# Patient Record
Sex: Female | Born: 1935 | Race: White | Hispanic: No | Smoking: Former smoker
Health system: Southern US, Community
[De-identification: ages and names within clinical notes are randomized; demographics above are authoritative.]

## PROBLEM LIST (undated history)

## (undated) DIAGNOSIS — M109 Gout, unspecified: Secondary | ICD-10-CM

## (undated) DIAGNOSIS — I499 Cardiac arrhythmia, unspecified: Secondary | ICD-10-CM

## (undated) DIAGNOSIS — L97519 Non-pressure chronic ulcer of other part of right foot with unspecified severity: Secondary | ICD-10-CM

## (undated) DIAGNOSIS — I509 Heart failure, unspecified: Secondary | ICD-10-CM

## (undated) DIAGNOSIS — I10 Essential (primary) hypertension: Secondary | ICD-10-CM

## (undated) DIAGNOSIS — E119 Type 2 diabetes mellitus without complications: Secondary | ICD-10-CM

## (undated) DIAGNOSIS — I96 Gangrene, not elsewhere classified: Secondary | ICD-10-CM

## (undated) DIAGNOSIS — I4891 Unspecified atrial fibrillation: Secondary | ICD-10-CM

## (undated) DIAGNOSIS — I739 Peripheral vascular disease, unspecified: Secondary | ICD-10-CM

## (undated) HISTORY — PX: EYE SURGERY: SHX253

## (undated) HISTORY — PX: LEG AMPUTATION THROUGH KNEE: SHX696

## (undated) HISTORY — PX: OTHER SURGICAL HISTORY: SHX169

## (undated) HISTORY — PX: JOINT REPLACEMENT: SHX530

---

## 2004-06-27 ENCOUNTER — Encounter: Payer: Self-pay | Admitting: Anesthesiology

## 2005-09-21 ENCOUNTER — Ambulatory Visit: Payer: Self-pay | Admitting: Ophthalmology

## 2005-10-04 ENCOUNTER — Ambulatory Visit: Payer: Self-pay | Admitting: Ophthalmology

## 2005-11-27 ENCOUNTER — Inpatient Hospital Stay: Payer: Self-pay | Admitting: Internal Medicine

## 2005-11-27 ENCOUNTER — Other Ambulatory Visit: Payer: Self-pay

## 2014-10-25 ENCOUNTER — Inpatient Hospital Stay: Payer: Self-pay | Admitting: Internal Medicine

## 2014-10-25 LAB — CBC WITH DIFFERENTIAL/PLATELET
BASOS PCT: 0.7 %
Basophil #: 0.1 10*3/uL (ref 0.0–0.1)
EOS ABS: 0.1 10*3/uL (ref 0.0–0.7)
Eosinophil %: 0.9 %
HCT: 29 % — ABNORMAL LOW (ref 35.0–47.0)
HGB: 8.6 g/dL — AB (ref 12.0–16.0)
LYMPHS PCT: 18 %
Lymphocyte #: 2.1 10*3/uL (ref 1.0–3.6)
MCH: 20.9 pg — AB (ref 26.0–34.0)
MCHC: 29.5 g/dL — ABNORMAL LOW (ref 32.0–36.0)
MCV: 71 fL — ABNORMAL LOW (ref 80–100)
MONO ABS: 0.9 x10 3/mm (ref 0.2–0.9)
Monocyte %: 7.8 %
NEUTROS PCT: 72.6 %
Neutrophil #: 8.3 10*3/uL — ABNORMAL HIGH (ref 1.4–6.5)
PLATELETS: 457 10*3/uL — AB (ref 150–440)
RBC: 4.09 10*6/uL (ref 3.80–5.20)
RDW: 22.4 % — ABNORMAL HIGH (ref 11.5–14.5)
WBC: 11.4 10*3/uL — ABNORMAL HIGH (ref 3.6–11.0)

## 2014-10-25 LAB — COMPREHENSIVE METABOLIC PANEL
ALBUMIN: 2.8 g/dL — AB (ref 3.4–5.0)
ANION GAP: 5 — AB (ref 7–16)
AST: 33 U/L (ref 15–37)
Alkaline Phosphatase: 73 U/L (ref 46–116)
BILIRUBIN TOTAL: 0.6 mg/dL (ref 0.2–1.0)
BUN: 23 mg/dL — ABNORMAL HIGH (ref 7–18)
CHLORIDE: 103 mmol/L (ref 98–107)
CREATININE: 0.97 mg/dL (ref 0.60–1.30)
Calcium, Total: 9.3 mg/dL (ref 8.5–10.1)
Co2: 32 mmol/L (ref 21–32)
EGFR (African American): >60
EGFR (Non-African Amer.): 57 — ABNORMAL LOW
Glucose: 116 mg/dL — ABNORMAL HIGH (ref 65–99)
OSMOLALITY: 284 (ref 275–301)
POTASSIUM: 3.8 mmol/L (ref 3.5–5.1)
SGPT (ALT): 25 U/L (ref 14–63)
SODIUM: 140 mmol/L (ref 136–145)
TOTAL PROTEIN: 6.9 g/dL (ref 6.4–8.2)

## 2014-10-25 LAB — DIGOXIN LEVEL: Digoxin: 0.61 ng/mL

## 2014-10-25 LAB — PROTIME-INR
INR: 1.3
Prothrombin Time: 16.3 secs — ABNORMAL HIGH (ref 11.5–14.7)

## 2014-10-25 LAB — APTT: Activated PTT: 36 secs — ABNORMAL HIGH (ref 23.6–35.9)

## 2014-10-26 LAB — CBC WITH DIFFERENTIAL/PLATELET
BASOS ABS: 0.1 10*3/uL (ref 0.0–0.1)
Basophil %: 0.9 %
EOS PCT: 1.1 %
Eosinophil #: 0.2 10*3/uL (ref 0.0–0.7)
HCT: 29.3 % — ABNORMAL LOW (ref 35.0–47.0)
HGB: 8.5 g/dL — ABNORMAL LOW (ref 12.0–16.0)
LYMPHS PCT: 17.3 %
Lymphocyte #: 2.5 10*3/uL (ref 1.0–3.6)
MCH: 20.5 pg — ABNORMAL LOW (ref 26.0–34.0)
MCHC: 29.1 g/dL — ABNORMAL LOW (ref 32.0–36.0)
MCV: 71 fL — ABNORMAL LOW (ref 80–100)
MONOS PCT: 7.5 %
Monocyte #: 1.1 x10 3/mm — ABNORMAL HIGH (ref 0.2–0.9)
Neutrophil #: 10.3 10*3/uL — ABNORMAL HIGH (ref 1.4–6.5)
Neutrophil %: 73.2 %
PLATELETS: 445 10*3/uL — AB (ref 150–440)
RBC: 4.15 10*6/uL (ref 3.80–5.20)
RDW: 21.9 % — ABNORMAL HIGH (ref 11.5–14.5)
WBC: 14.1 10*3/uL — AB (ref 3.6–11.0)

## 2014-10-26 LAB — BASIC METABOLIC PANEL
Anion Gap: 6 — ABNORMAL LOW (ref 7–16)
BUN: 20 mg/dL — ABNORMAL HIGH (ref 7–18)
CALCIUM: 8.6 mg/dL (ref 8.5–10.1)
Chloride: 105 mmol/L (ref 98–107)
Co2: 30 mmol/L (ref 21–32)
Creatinine: 0.82 mg/dL (ref 0.60–1.30)
EGFR (African American): >60
EGFR (Non-African Amer.): >60
Glucose: 109 mg/dL — ABNORMAL HIGH (ref 65–99)
OSMOLALITY: 284 (ref 275–301)
POTASSIUM: 3.8 mmol/L (ref 3.5–5.1)
Sodium: 141 mmol/L (ref 136–145)

## 2014-10-26 LAB — FERRITIN: Ferritin (ARMC): 46 ng/mL (ref 8–388)

## 2014-10-26 LAB — IRON AND TIBC
IRON BIND. CAP.(TOTAL): 336 ug/dL (ref 250–450)
Iron Saturation: 5 %
Iron: 16 ug/dL — ABNORMAL LOW (ref 50–170)
UNBOUND IRON-BIND. CAP.: 320 ug/dL

## 2014-10-26 LAB — OCCULT BLOOD X 1 CARD TO LAB, STOOL: Occult Blood, Feces: NEGATIVE

## 2014-10-26 LAB — MAGNESIUM: MAGNESIUM: 1.9 mg/dL

## 2014-10-26 LAB — HEPARIN LEVEL (UNFRACTIONATED): Anti-Xa(Unfractionated): 0.4 IU/mL (ref 0.30–0.70)

## 2014-10-27 LAB — CBC WITH DIFFERENTIAL/PLATELET
BASOS PCT: 0.9 %
Basophil #: 0.1 10*3/uL (ref 0.0–0.1)
Eosinophil #: 0.2 10*3/uL (ref 0.0–0.7)
Eosinophil %: 1.5 %
HCT: 28.9 % — ABNORMAL LOW (ref 35.0–47.0)
HGB: 8.7 g/dL — AB (ref 12.0–16.0)
LYMPHS PCT: 22.9 %
Lymphocyte #: 2.8 10*3/uL (ref 1.0–3.6)
MCH: 21.5 pg — ABNORMAL LOW (ref 26.0–34.0)
MCHC: 30.3 g/dL — AB (ref 32.0–36.0)
MCV: 71 fL — ABNORMAL LOW (ref 80–100)
Monocyte #: 1.1 x10 3/mm — ABNORMAL HIGH (ref 0.2–0.9)
Monocyte %: 8.8 %
NEUTROS PCT: 65.9 %
Neutrophil #: 7.9 10*3/uL — ABNORMAL HIGH (ref 1.4–6.5)
PLATELETS: 422 10*3/uL (ref 150–440)
RBC: 4.07 10*6/uL (ref 3.80–5.20)
RDW: 22.2 % — ABNORMAL HIGH (ref 11.5–14.5)
WBC: 12 10*3/uL — ABNORMAL HIGH (ref 3.6–11.0)

## 2014-10-27 LAB — HEPARIN LEVEL (UNFRACTIONATED): Anti-Xa(Unfractionated): 0.32 IU/mL (ref 0.30–0.70)

## 2014-10-28 LAB — CBC WITH DIFFERENTIAL/PLATELET
Basophil #: 0 10*3/uL (ref 0.0–0.1)
Basophil %: 0.3 %
EOS PCT: 0.3 %
Eosinophil #: 0 10*3/uL (ref 0.0–0.7)
HCT: 32.1 % — ABNORMAL LOW (ref 35.0–47.0)
HGB: 9.5 g/dL — AB (ref 12.0–16.0)
LYMPHS ABS: 3.1 10*3/uL (ref 1.0–3.6)
Lymphocyte %: 22.3 %
MCH: 21.1 pg — ABNORMAL LOW (ref 26.0–34.0)
MCHC: 29.6 g/dL — ABNORMAL LOW (ref 32.0–36.0)
MCV: 71 fL — AB (ref 80–100)
MONO ABS: 1 x10 3/mm — AB (ref 0.2–0.9)
MONOS PCT: 7.4 %
Neutrophil #: 9.8 10*3/uL — ABNORMAL HIGH (ref 1.4–6.5)
Neutrophil %: 69.7 %
Platelet: 502 10*3/uL — ABNORMAL HIGH (ref 150–440)
RBC: 4.5 10*6/uL (ref 3.80–5.20)
RDW: 22.1 % — ABNORMAL HIGH (ref 11.5–14.5)
WBC: 14.1 10*3/uL — ABNORMAL HIGH (ref 3.6–11.0)

## 2014-10-28 LAB — CREATININE, SERUM
CREATININE: 0.97 mg/dL (ref 0.60–1.30)
EGFR (African American): >60
EGFR (Non-African Amer.): 57 — ABNORMAL LOW

## 2014-10-29 LAB — VANCOMYCIN, TROUGH: VANCOMYCIN, TROUGH: 11 ug/mL (ref 10–20)

## 2014-10-29 LAB — CREATININE, SERUM
Creatinine: 0.87 mg/dL (ref 0.60–1.30)
EGFR (African American): >60
EGFR (Non-African Amer.): >60

## 2014-10-30 LAB — BASIC METABOLIC PANEL
ANION GAP: 3 — AB (ref 7–16)
BUN: 27 mg/dL — ABNORMAL HIGH (ref 7–18)
CHLORIDE: 103 mmol/L (ref 98–107)
CO2: 36 mmol/L — AB (ref 21–32)
CREATININE: 0.88 mg/dL (ref 0.60–1.30)
Calcium, Total: 8.7 mg/dL (ref 8.5–10.1)
EGFR (Non-African Amer.): >60
Glucose: 188 mg/dL — ABNORMAL HIGH (ref 65–99)
Osmolality: 293 (ref 275–301)
Potassium: 4.3 mmol/L (ref 3.5–5.1)
SODIUM: 142 mmol/L (ref 136–145)

## 2014-10-31 LAB — BASIC METABOLIC PANEL
ANION GAP: 3 — AB (ref 7–16)
BUN: 20 mg/dL — ABNORMAL HIGH (ref 7–18)
CO2: 38 mmol/L — AB (ref 21–32)
Calcium, Total: 8.7 mg/dL (ref 8.5–10.1)
Chloride: 101 mmol/L (ref 98–107)
Creatinine: 0.7 mg/dL (ref 0.60–1.30)
EGFR (African American): >60
Glucose: 83 mg/dL (ref 65–99)
OSMOLALITY: 285 (ref 275–301)
Potassium: 3.6 mmol/L (ref 3.5–5.1)
Sodium: 142 mmol/L (ref 136–145)

## 2014-11-14 ENCOUNTER — Inpatient Hospital Stay: Payer: Self-pay | Admitting: Internal Medicine

## 2014-11-21 ENCOUNTER — Emergency Department: Payer: Self-pay | Admitting: Emergency Medicine

## 2014-12-26 ENCOUNTER — Ambulatory Visit
Admit: 2014-12-26 | Disposition: A | Discharge status: Home / Self Care | Payer: Self-pay | Attending: Vascular Surgery | Admitting: Vascular Surgery

## 2014-12-26 LAB — CBC
HCT: 30.1 % — ABNORMAL LOW (ref 35.0–47.0)
HGB: 9.4 g/dL — AB (ref 12.0–16.0)
MCH: 23.3 pg — ABNORMAL LOW (ref 26.0–34.0)
MCHC: 31.1 g/dL — ABNORMAL LOW (ref 32.0–36.0)
MCV: 75 fL — ABNORMAL LOW (ref 80–100)
PLATELETS: 352 10*3/uL (ref 150–440)
RBC: 4.01 10*6/uL (ref 3.80–5.20)
RDW: 26.7 % — ABNORMAL HIGH (ref 11.5–14.5)
WBC: 9.1 10*3/uL (ref 3.6–11.0)

## 2014-12-26 LAB — BASIC METABOLIC PANEL
ANION GAP: 9 (ref 7–16)
BUN: 24 mg/dL — AB
CREATININE: 0.79 mg/dL
Calcium, Total: 9.3 mg/dL
Chloride: 99 mmol/L — ABNORMAL LOW
Co2: 31 mmol/L
EGFR (African American): >60
EGFR (Non-African Amer.): >60
GLUCOSE: 146 mg/dL — AB
Potassium: 4.5 mmol/L
Sodium: 139 mmol/L

## 2014-12-26 LAB — PROTIME-INR
INR: 1.3
Prothrombin Time: 16.5 secs — ABNORMAL HIGH

## 2014-12-26 LAB — APTT: ACTIVATED PTT: 35.3 s (ref 23.6–35.9)

## 2015-01-03 ENCOUNTER — Inpatient Hospital Stay
Admit: 2015-01-03 | Disposition: A | Discharge status: Skilled Nursing Facility | Payer: Self-pay | Attending: Vascular Surgery | Admitting: Vascular Surgery

## 2015-01-03 LAB — CREATININE, SERUM
Creatinine: 0.53 mg/dL
EGFR (African American): >60
EGFR (Non-African Amer.): >60

## 2015-01-04 LAB — CBC WITH DIFFERENTIAL/PLATELET
BASOS PCT: 0.9 %
Basophil #: 0.1 10*3/uL (ref 0.0–0.1)
EOS ABS: 0.1 10*3/uL (ref 0.0–0.7)
Eosinophil %: 1.3 %
HCT: 23.9 % — ABNORMAL LOW (ref 35.0–47.0)
HGB: 7.3 g/dL — ABNORMAL LOW (ref 12.0–16.0)
Lymphocyte #: 2.3 10*3/uL (ref 1.0–3.6)
Lymphocyte %: 26.2 %
MCH: 23.1 pg — AB (ref 26.0–34.0)
MCHC: 30.4 g/dL — ABNORMAL LOW (ref 32.0–36.0)
MCV: 76 fL — ABNORMAL LOW (ref 80–100)
Monocyte #: 1 x10 3/mm — ABNORMAL HIGH (ref 0.2–0.9)
Monocyte %: 10.9 %
Neutrophil #: 5.4 10*3/uL (ref 1.4–6.5)
Neutrophil %: 60.7 %
Platelet: 254 10*3/uL (ref 150–440)
RBC: 3.15 10*6/uL — AB (ref 3.80–5.20)
RDW: 27.4 % — ABNORMAL HIGH (ref 11.5–14.5)
WBC: 8.9 10*3/uL (ref 3.6–11.0)

## 2015-01-04 LAB — BASIC METABOLIC PANEL
Anion Gap: 5 — ABNORMAL LOW (ref 7–16)
BUN: 16 mg/dL
CREATININE: 0.62 mg/dL
Calcium, Total: 8 mg/dL — ABNORMAL LOW
Chloride: 103 mmol/L
Co2: 30 mmol/L
GLUCOSE: 86 mg/dL
Potassium: 3.8 mmol/L
SODIUM: 138 mmol/L

## 2015-01-05 LAB — COMPREHENSIVE METABOLIC PANEL
ALK PHOS: 50 U/L
ANION GAP: 4 — AB (ref 7–16)
Albumin: 2.3 g/dL — ABNORMAL LOW
BUN: 15 mg/dL
Bilirubin,Total: 0.3 mg/dL
CALCIUM: 8 mg/dL — AB
CHLORIDE: 103 mmol/L
CREATININE: 0.63 mg/dL
Co2: 31 mmol/L
EGFR (African American): >60
GLUCOSE: 104 mg/dL — AB
Potassium: 4 mmol/L
SGOT(AST): 22 U/L
SGPT (ALT): 10 U/L — ABNORMAL LOW
Sodium: 138 mmol/L
Total Protein: 5.4 g/dL — ABNORMAL LOW

## 2015-01-05 LAB — HEMOGLOBIN: HGB: 7.3 g/dL — ABNORMAL LOW (ref 12.0–16.0)

## 2015-01-20 LAB — SURGICAL PATHOLOGY

## 2015-01-26 NOTE — Discharge Summary (Signed)
PATIENT NAMNolon Nations:  Pitones, [name] DATE OF BIRTH:  05/24/1921  DATE OF ADMISSION:  01/03/2015 DATE OF DISCHARGE:    DISCHARGE DIAGNOSES: Atherosclerotic occlusive disease, bilateral lower extremities, status post left below-knee amputation with ulcerations of the right foot.   SECONDARY DIAGNOSES: 1.  Diabetes.  2.  Hypertension.  3.  Chronic atrial fibrillation.  4.  History of depression.   PROCEDURES PERFORMED: 1.  Left below-knee amputation 01/03/2015.  2.  Angiography right lower extremity with angioplasty of the anterior tibial artery, 01/07/2015.   CONSULTATIONS: [facility].   HISTORY: [name] is a 81 year old woman who has undergone several attempts at limb salvage on the left; these were not successful, and she is therefore undergoing below-knee amputation.   HOSPITAL COURSE: On the day of admission, she underwent successful below-the-knee amputation without complication; postoperatively she did well without any unusual postoperative incidence. On postoperative day number 4, she underwent angiography of the right lower extremity for limb salvage as she has multiple ulcers on this side; this too was successful. During the period sedation, her initial postoperative dressing was changed and her suture line was inspected. Posterior flap appears quite healthy and the suture line appears to be healing nicely.   On postoperative day number 5, she is fit for discharge. She is discharged to skilled nursing. She will follow up with me in the office in approximately 2 weeks for staple removal. She will continue an ADA diet. She is to continue physical therapy. She is unrestricted in her upper body. Her right leg is full weight bearing, left leg is exercises as tolerated.   MEDICATIONS: Are as noted in discharge, she is on OxyContin as a baseline. She can have supplemental Percocet if needed and she will continue her Eliquis for her atrial fibrillation.    CONDITION ON DISCHARGE: Improved.    ____________________________ Renford DillsGregory G. Bosco Paparella, MD ggs:nt D: 01/08/2015 17:22:32 ET T: 01/08/2015 17:32:25 ET JOB#: [id number]  cc: Renford DillsGregory G. Kean Gautreau, MD, <Dictator> Renford DillsGREGORY G Elysabeth Aust MD ELECTRONICALLY SIGNED 01/14/2015 12:55

## 2015-01-26 NOTE — Op Note (Signed)
PATIENT NAMNolon [name]:  [name], [name] [name] MR#:  [id number] DATE OF BIRTH:  April 26, 1921  DATE OF PROCEDURE:  01/07/2015  PREOPERATIVE DIAGNOSES:  1.  Atherosclerotic occlusive disease bilateral lower extremities with multiple ulcerations. 2.  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  3.  Diabetes.  4.  Cardiac arrhythmia.   POSTOPERATIVE DIAGNOSES:  1.  Atherosclerotic occlusive disease bilateral lower extremities with multiple ulcerations. 2.  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  3.  Diabetes.  4.  Cardiac arrhythmia.   PROCEDURES PERFORMED:  1.  Right lower extremity distal runoff, 3rd order catheter placement.  2.  Crosser atherectomy right anterior tibial artery.  3.  Percutaneous transluminal angioplasty right anterior tibial artery to a maximal diameter of 2.5 mm.   SURGEON: Renford DillsGregory G. Krishana Lutze, M.D.   SEDATION:  Versed plus fentanyl.   ACCESS: A 6 French sheath left common femoral artery.   FLUOROSCOPY TIME: 11.8 minutes.   CONTRAST USED: Isovue 75 mL.   INDICATIONS: [name] [name] is a 81 year old woman who has recently undergone amputation of the left lower extremity secondary to gangrenous changes of the foot and concomitantly has ischemic ulcerations and rest pain of the right lower extremity. She has tolerated her surgery well and is now undergoing angiography for limb salvage on the right. Risks and benefits have been reviewed. All questions answered. The patient agrees to proceed.   DESCRIPTION OF PROCEDURE: The patient is taken to special procedures and placed in the supine position. After adequate sedation is achieved, both groins are prepped and draped in sterile fashion and appropriate timeout is called.   Ultrasound is placed in a sterile sleeve and the common femoral artery is identified. It is echolucent and pulsatile indicating patency. Image is recorded for the permanent record and under real-time visualization lidocaine  is infiltrated and subsequently a microneedle is inserted into the common femoral artery. Microwire followed by micro sheath, J-wire followed by a 5 French sheath and 5 French pigtail catheter.   The pigtail catheter is positioned at T12- L1 and an AP projection of the aorta is obtained. The pigtail catheter is then repositioned and an LAO projection of the pelvis is obtained. Using a rim catheter and a Glidewire, the aortic bifurcation is crossed and then the pigtail catheter is reintroduced and positioned in the distal external iliac on the right and an RAO projection of the groin is obtained. Wire is reintroduced and the catheter is advanced down into the SFA where distal runoff is obtained. Distal images are inadequate and the wire again is reintroduced and a 125 straight slip graft is positioned in the distal popliteal and tibial imaging is obtained. After review of the images, it is decided to intervene.  Four thousand units of heparin are given. An Amplatz Super Stiff wire is advanced through the catheter and subsequently a 6 [place] Raby is advanced up and over the bifurcation and positioned with its tip in the proximal SFA. Using the Glidewire and the straight catheter, the anterior tibial is engaged.  The occlusion at its origin is crossed with initially a 0.035 Glidewire, but then with the assistance of a 0.018 V-18, a 2.5 x 6 mm balloon is then advanced across the proximal 3 to 4 cm of the anterior tibial and balloon angioplasty is performed for 2 minutes to 12 atmospheres. Followup imaging demonstrates a good result and the Usher catheter is advanced into the anterior tibial over the V-18 wire. Subsequently. V-18 wire is  removed and the S6 Crosser atherectomy catheter is advanced through the Usher.  It is now used to advance through the occlusions in the anterior tibial all the way down to the dorsalis pedis. Hand injection of contrast demonstrates intraluminal placement with appropriate crossing of  all the anterior tibial occlusions and therefore the V-18 wire is reintroduced and a 2 mm x 30 cm Ultraverse balloon is advanced down to the level of the dorsalis pedis.  Inflation is to 14 atmospheres for 2 minutes. Followup imaging demonstrates patency of the anterior tibial and therefore the procedure is terminated after successful recanalization of the anterior tibial down to the dorsalis pedis.   The sheath is pulled into the left. Oblique view is obtained and a StarClose device deployed. There are no immediate complications.   INTERPRETATION: The initial images demonstrate the aortic bifurcation, the common and external iliac artery on the right are widely patent as is the common femoral and profunda femoris.  Superficial femoral artery demonstrates diffuse disease, but there are no hemodynamically significant stenoses. With the knee very flexed, oblique imaging is obtained of the popliteal behind the knee replacement demonstrating that the popliteal is widely patent in its entirety. At the level of the trifurcation, there is extensive diffuse disease.  The peroneal is patent. The anterior tibial is occluded throughout the majority of its course.  The anterior tibial is occluded in multiple segments, but does appear to track all the way down to the dorsalis pedis. The peroneal does not appear to collateralize to the foot even though it is patent down to the ankle.   Following engaging the anterior tibial and angioplasty as described above, there is now patency of the anterior tibial from its origin all the way down to the dorsalis pedis filling the pedal vessels.   SUMMARY: Successful salvage of right lower extremity as described above with recanalization of the anterior tibial.   ____________________________ Renford Dills, MD ggs:sp D: 01/07/2015 11:35:36 ET T: 01/07/2015 12:42:46 ET JOB#: [id number]  cc: Renford Dills, MD, <Dictator> Renford Dills MD ELECTRONICALLY SIGNED  01/07/2015 15:14

## 2015-01-26 NOTE — Consult Note (Signed)
Brief Consult Note: Diagnosis: gangrene of the left forefoot, ASO bilateral lower extremities, cellulitis left leg.   Patient was seen by consultant.   Recommend further assessment or treatment.   Comments: Given her extensive tissue loss and nonpalpable pulses she will need angiography with the hope for intervention for limb salvage.  Continue antibiotics for now and I will plan angiography likely on Tuesday.  Electronic Signatures: Levora DredgeSchnier, Gregory (MD)  (Signed 29-Jan-16 20:38)  Authored: Brief Consult Note   Last Updated: 29-Jan-16 20:38 by Levora DredgeSchnier, Gregory (MD)

## 2015-01-26 NOTE — Op Note (Signed)
PATIENT NAME:  [name], [name] MR#:  [id number] DATE OF BIRTH:  1920/12/23  DATE OF PROCEDURE:  11/15/2014  SURGEON: Ricci Barkerodd W. Kazue Cerro, DPM  PREOPERATIVE DIAGNOSES: Gangrene, left forefoot.   POSTOPERATIVE DIAGNOSIS: Gangrene, left forefoot.  PROCEDURE: Transmetatarsal amputation left foot.   ANESTHESIA: General LMA.   HEMOSTASIS: None.   ESTIMATED BLOOD LOSS: 50 mL.   PATHOLOGY: Left forefoot.   DRAINS: None.   MATERIALS: Wound VAC applied postoperatively.   COMPLICATIONS: None apparent.   Operative indications: This is a 81 year old female with recent development of gangrenous changes to her left foot. Underwent successful arterial revascularization earlier this week and was stabilized for transmetatarsal amputation of her left forefoot.   OPERATIVE PROCEDURE: The patient was taken to the Operating Room and placed on the table in the supine position. Following satisfactory general anesthesia, the left foot was prepped and draped in the usual sterile fashion.   Attention was directed to the left forefoot where an incision was made across the dorsum of the foot from medial to lateral, just proximal to the gangrenous changes in the forefoot at about the level of the distal metatarsal necks. The incision was carried sharply down to the level of the bone. Soft tissues were elevated off of the bone using a Art therapistKey elevator. Next, a similar plantar incision was made from medial to lateral across the plantar surface of the forefoot, just proximal to the gangrenous changes. This was carried sharply down to the level of the bone. Next, using a pneumatic saw, metatarsals 1, 2, 3, 4 and 5 were all incised just distal to the proximal joints. The forefoot was then completely disarticulated. There was noted to be healthy bleeding tissues present with no signs of any abscess or infection. Some debulking of the plantar aspect was performed. There was noted to be adequate skin for closure of the 2 flaps over the  transected metatarsals. The wound was flushed with copious amounts of sterile saline and closed using 3-0 Vicryl simple interrupted sutures for deep and superficial subcutaneous closure, as well as 4-0 Vicryl simple interrupted sutures for the more superficial subcutaneous. Skin closure was then achieved using 3-0 nylon simple interrupted sutures. Next, a wound VAC was applied along the incision and the amputation site. There was noted to be a good seal and vacuum on the wound VAC once it was activated. A Kerlix was then applied over the bandaging. The patient was awakened and transported to the PACU with vital signs stable and in good condition.    ____________________________ Linus Galasodd Richardine Peppers, DPM tc:ap D: 11/15/2014 21:45:46 ET T: 11/16/2014 09:52:06 ET JOB#: [id number]  cc: Linus Galasodd Joshua Soulier, DPM, <Dictator> Renford DillsGregory G. Schnier, MD Karrie Fluellen DPM ELECTRONICALLY SIGNED 11/27/2014 9:11

## 2015-01-26 NOTE — Op Note (Signed)
PATIENT NAMNolon [name]:  [name], [name] MR#:  [id number] DATE OF BIRTH:  01-Apr-1921  DATE OF PROCEDURE:  01/03/2015  PREOPERATIVE DIAGNOSES:   1.  Atherosclerotic occlusive disease, bilateral lower extremities, with gangrene of the left forefoot.  2.  Complication of vascular device with occlusion of popliteal stent.   POSTOPERATIVE DIAGNOSES: 1.  Atherosclerotic occlusive disease, bilateral lower extremities, with gangrene of the left forefoot.  2.  Complication of vascular device with occlusion of popliteal stent.    PROCEDURE PERFORMED: Left below-knee amputation.   SURGEON: Renford DillsGregory G. Jovie Swanner, MD   FIRST ASSISTANT: Ms. Venida JarvisKim Stegmaier   ANESTHESIA: General by LMA.   FLUIDS: Per anesthesia record.   ESTIMATED BLOOD LOSS: 150 mL.   SPECIMEN: Distal limb to pathology for permanent section.   INDICATIONS: [name] is a 81 year old woman who has undergone attempted limb salvage for gangrenous changes of her forefoot. Unfortunately, she has continued to progress and will now require below-knee amputation. Risks and benefits were reviewed. All questions have been answered. The patient agrees to proceed.   DESCRIPTION OF PROCEDURE: The patient was taken to the Operating Room and placed in the supine position. After adequate general anesthesia was induced and appropriate invasive monitors were placed, she was positioned supine and her left leg and foot were prepped and draped in a circumferential fashion. Appropriate timeout was called.   Working approximately 1 handbreadth below the patella, circumferential measurement at this level was made with umbilical tape. It was then divided into thirds and a posterior flap reconstruction was diagrammed onto the leg.   Skin incision was then made working from a medial to lateral direction and then down distally medial and laterally. Bleeding encountered in the subcutaneous tissues was controlled with cautery and/or silk ties. The fascia was then incised and  the muscle bellies were transected with Bovie cautery. The anterior tibial vascular bundle as well as the posterior tibial vascular bundle were isolated and then ligated and divided with 0 Vicryl. Periosteum was raised on both the fibula and the tibia. The fibula was transected with bone shears. The tibia was transected with a Gigli saw. Posterior muscle bellies were then transected with an amputation knife after the posterior flap was incised with a scalpel along the previously marked skin line. The specimen was then passed off the field.   Peroneal vascular bundle was then clamped with a hemostat and ligated with 0 Vicryl. Bleeding points noted within the muscles were then controlled with Vicryl and/or Bovie cautery. A rasp was then used to smooth the tibial edge and the surgical site was irrigated with a liter of saline.   The skin was folded forward and noted to be tension-free and, therefore, the fascia was reapproximated with 0 Vicryl. Skin was reapproximated with staples after EpiFix was placed in the bed prior to skin closure. Xeroform, followed by a fluff gauze dressing, followed by an Ioban to secure the dressing was then applied. The patient tolerated the procedure well and there were no immediate complications.    ____________________________ Renford DillsGregory G. Saim Almanza, MD ggs:TT D: 01/04/2015 17:32:37 ET T: 01/05/2015 02:36:45 ET JOB#: [id number]  cc: Renford DillsGregory G. Anothony Bursch, MD, <Dictator> Renford DillsGREGORY G Lavona Norsworthy MD ELECTRONICALLY SIGNED 01/07/2015 15:14

## 2015-01-26 NOTE — Consult Note (Signed)
Chief Complaint:  Subjective/Chief Complaint Patient states leg improved no significant pain today   VITAL SIGNS/ANCILLARY NOTES: **Vital Signs.:   03-Feb-16 12:11  Vital Signs Type Routine  Temperature Temperature (F) 98.6  Celsius 37  Temperature Source oral  Pulse Pulse 96  Respirations Respirations 18  Systolic BP Systolic BP 629  Diastolic BP (mmHg) Diastolic BP (mmHg) 83  Mean BP 112  Pulse Ox % Pulse Ox % 95  Pulse Ox Activity Level  At rest  Oxygen Delivery 2L   Brief Assessment:  GEN well developed, well nourished, no acute distress   Cardiac Irregular  murmur present   Respiratory normal resp effort  clear BS   Gastrointestinal Normal   Gastrointestinal details normal Soft  Nontender   EXTR positive cyanosis/clubbing, negative edema, dry gangrene of the left foot   Lab Results: Routine Chem:  03-Feb-16 14:37   Glucose, Serum  188  BUN  27  Creatinine (comp) 0.88  Sodium, Serum 142  Potassium, Serum 4.3  Chloride, Serum 103  CO2, Serum  36  Calcium (Total), Serum 8.7  Anion Gap  3  Osmolality (calc) 293  eGFR (African American) >60  eGFR (Non-African American) >60 (eGFR values <64mL/min/1.73 m2 may be an indication of chronic kidney disease (CKD). Calculated eGFR, using the MRDR Study equation, is useful in  patients with stable renal function. The eGFR calculation will not be reliable in acutely ill patients when serum creatinine is changing rapidly. It is not useful in patients on dialysis. The eGFR calculation may not be applicable to patients at the low and high extremes of body sizes, pregnant women, and vegetarians.)   Radiology Results: XRay:    29-Jan-16 13:59, Foot Left Complete  Foot Left Complete   REASON FOR EXAM:    painful, red/purple toes  COMMENTS:       PROCEDURE: DXR - DXR FOOT LT COMP W/OBLIQUES  - Oct 25 2014  1:59PM     CLINICAL DATA:  Pain and swelling.    EXAM:  LEFT FOOT - COMPLETE 3+ VIEW    COMPARISON:   None.    FINDINGS:  Severeand diffuse calcifications involving the arteries of the  ankle and foot. There is also diffuse soft tissue calcifications  which could be due to dermatomyositis. No destructive bone changes  to suggest osteomyelitis. I do not see any obvious gas in the soft  tissues. Moderate degenerative changes and osteoporosis.     IMPRESSION:  No definite plain film findings for osteomyelitis.    Extensive vascular calcifications.      Electronically Signed    By: Kalman Jewels M.D.    On: 10/25/2014 14:27      Verified By: Marlane Hatcher, M.D.,    31-Jan-16 10:54, Chest PA and Lateral  Chest PA and Lateral   REASON FOR EXAM:    Hypoxia. [name]  COMMENTS:       PROCEDURE: DXR - DXR CHEST PA (OR AP) AND LATERAL  - Oct 27 2014 10:54AM     CLINICAL DATA:  81 year old female with history of hypoxia and  wheezing today. Painful left foot. Diabetic patient.    EXAM:  CHEST  2 VIEW    COMPARISON:  No priors.    FINDINGS:  Low lung volumes. Bibasilar opacities favored to reflect areas of  subsegmental atelectasis with small bilateral pleural effusions.  Mild cephalization of the pulmonary vasculature with indistinct  interstitial markings suggesting a background of mild interstitial  pulmonary edema. Visual thickening. Mild  cardiomegaly. Upper  mediastinal contours are distorted by patient positioning.  Atherosclerosis in the thoracic aorta.     IMPRESSION:  1. The appearance of the chest suggests mild congestive heart  failure, as above.      Electronically Signed    By: Vinnie Langton M.D.    On: 10/27/2014 11:33     Verified By: Etheleen Mayhew, M.D.,    03-Feb-16 10:59, Chest PA and Lateral  Chest PA and Lateral   REASON FOR EXAM:    tachypnea, wheezing  COMMENTS:       PROCEDURE: DXR - DXR CHEST PA (OR AP) AND LATERAL  - Oct 30 2014 10:59AM     CLINICAL DATA:  Tachypnea.  Wheezing.  Shortness of breath.    EXAM:  CHEST  2  VIEW    COMPARISON:  10/27/2014 and 11/27/2005    FINDINGS:  There is persistent cardiomegaly with slightly decreased bilateral  pleural effusions, larger on the right than the left. Pulmonary  vascular prominence has diminished.    No acute osseous abnormality.     IMPRESSION:  Improving congestive heart failure.      Electronically Signed    By: Rozetta Nunnery M.D.    On: 10/30/2014 11:06         Verified By: Larey Seat, M.D.,  Cardiology:    29-Jan-16 12:08, ECG  Ventricular Rate 109  Atrial Rate 113  QRS Duration 74  QT 296  QTc 398  R Axis 55  T Axis 79  ECG interpretation   Atrial fibrillation with rapid ventricular response  Low voltage QRS  Nonspecific T wave abnormality , probably digitalis effect  Abnormal ECG  When compared with ECG of 27-Nov-2005 20:10,  No significant change was found  Confirmed by Humphrey Rolls, SHAUKAT ([id number]) on 10/28/2014 2:37:02 PM    Overreader: Neoma Laming  ECG     01-Feb-16 14:27, Echo Doppler  Echo Doppler   REASON FOR EXAM:      COMMENTS:       PROCEDURE: [facility] - ECHO DOPPLER COMPLETE(TRANSTHOR)  - Oct 28 2014  2:27PM     RESULT: Echocardiogram Report    Patient Name:   [name] Date of Exam: 10/28/2014  Medical Rec #:  [id number]         Custom1:  Date of Birth:  March 20, 1921       Height:       67.0 in  Patient Age:    81 years       Weight:       182.0 lb  Patient Gender: F              BSA:          1.94 m??    Indications: Atrial Fib  Sonographer:    Sherrie Sport RDCS  Referring Phys: Loletha Grayer, J    Summary:   1. Left ventricular ejection fraction, by visual estimation, is 55 to   60%.   2. Normal global left ventricular systolic function.   3. Mildly increased left ventricular septal thickness.   4. Right ventricular volume overload.   5. Moderately dilated left atrium.   6. Mildly dilated right atrium.   7. Mild to moderate mitral valve regurgitation.   8. Mild aortic regurgitation.   9. Mild to moderate  aortic valve stenosis.  10. Moderately elevated pulmonary artery systolic pressure.  11. Mild to moderate tricuspid regurgitation.  12. Mildly increased left ventricular posterior wall thickness.  2D AND M-MODE MEASUREMENTS (normal ranges within parentheses):  Left Ventricle:          Normal  IVSd (2D):      1.30 cm (0.7-1.1)  LVPWd (2D):     1.24 cm (0.7-1.1) Aorta/LA:                  Normal  LVIDd (2D):     4.31 cm (3.4-5.7) Aortic Root (2D): 2.90 cm (2.4-3.7)  LVIDs (2D):     3.07 cm           Left Atrium (2D): 5.30 cm (1.9-4.0)  LV FS (2D):     28.8 %   (>25%)  LV EF (2D):    55.7 %   (>50%)                                    Right Ventricle:                                    RVd (2D):        5.17 cm  LV DIASTOLIC FUNCTION:  MV Peak E: 0.89 m/s E/e' Ratio: 12.20                      Decel Time: 204 msec  SPECTRAL DOPPLER ANALYSIS (where applicable):  Mitral Valve:  MV P1/2 Time: 59.16 msec  MV Area, PHT: 3.72 cm??  Aortic Valve: AoV Max Vel: 1.68 m/s AoV Peak PG: 11.3 mmHg AoV Mean PG:   6.3 mmHg  LVOT Vmax: 0.53 m/s LVOT VTI: 0.110 m LVOT Diameter: 2.00 cm  AoV Area, Vmax: 0.98 cm?? AoV Area, VTI: 1.07 cm?? AoV Area, Vmn: 1.04 cm??  Tricuspid Valve and PA/RV Systolic Pressure: TR Max Velocity: 3.45 m/s RA   Pressure: 10 mmHg RVSP/PASP: 57.5 mmHg  Pulmonic Valve:  PV Max Velocity: 0.75 m/s PV Max PG: 2.3 mmHg PV Mean PG:    PHYSICIAN INTERPRETATION:  Left Ventricle: The left ventricular internal cavity size was normal. LV   septal wall thickness was mildly increased. LV posterior wall thickness     was mildly increased. Global LV systolic function was normal.Left   ventricular ejection fraction, by visual estimation, is 55 to 60%. The   interventricular septum is flattened in diastole ('D' shaped left   ventricle), consistent with right ventricular volume overload.  Right Ventricle: The right ventricularsize is normal. Global RV systolic   function is normal.  Left  Atrium: The left atrium is moderately dilated.  Right Atrium: The right atrium is mildly dilated.  Pericardium: There is no evidence of pericardial effusion.  Mitral Valve: The mitral valve is normal in structure. Mild to moderate   mitral valve regurgitation is seen.  Tricuspid Valve: The tricuspid valve is normal. Mild to moderate   tricuspid regurgitation is visualized. The tricuspid regurgitant velocity   is 3.45 m/s, and with an assumed right atrial pressure of 10 mmHg, the   estimated right ventricular systolic pressure is moderately elevated at     57.5 mmHg.  Aortic Valve: The aortic valve is normal. Mild to moderate aortic   stenosis is present. Mild aortic valve regurgitation is seen.  Pulmonic Valve: The pulmonic valve is normal. No indication of pulmonic   valve regurgitation.    [place]  Electronically signed by [id number]  Lujean Amel MD  Signature Date/Time: 10/28/2014/6:28:09 PM    *** Final ***    IMPRESSION: .      Verified By: Yolonda Kida, M.D., MD   Assessment/Plan:  Assessment/Plan:  Assessment shortness of breath atrial fibrillation  gangrene of the left foot  peripheral vascular disease  hypertension  diabetes  coagulopathy on Coumadin  anemia .   Plan agree with vascular input for gangrene  continue anticoagulation with Coumadin for atrial fibrillation  continue rate control for atrial fibrillation  oxygen therapy for mild shortness of breath  continue pain control for left foot  agree with echocardiogram for source   thrombus  continue telemetry for atrial fibrillation   Electronic Signatures: Yolonda Kida (MD)  (Signed 03-Feb-16 17:56)  Authored: Chief Complaint, VITAL SIGNS/ANCILLARY NOTES, Brief Assessment, Lab Results, Radiology Results, Assessment/Plan   Last Updated: 03-Feb-16 17:56 by Yolonda Kida (MD)

## 2015-01-26 NOTE — Op Note (Signed)
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   PROCEDURES PERFORMED:    1. Abdominal aortogram.  2. Left lower extremity distal runoff, third order catheter placement.  3. Crosser atherectomy left popliteal artery.  4. Attempted crossing of left peroneal artery, unsuccessful.   SURGEON:  Levora DredgeGregory Schnier, MD.    SEDATION: Versed plus fentanyl. Continuous ECG, pulse oximetry, and cardiopulmonary monitoring is performed throughout the entire procedure by the interventional radiology nurse. Total sedation time was 2 hours.   ACCESS:  7 French sheath right common femoral artery.   FLUOROSCOPY TIME: 22.6 minutes.   CONTRAST USED: Isovue 90 mL.   INDICATIONS: [name] is a 81 year old woman who recently moved to [place] to be with her family and was found to have ischemic changes to the left forefoot. She was brought to the ER and subsequently admitted to the hospital. She has been treated with antibiotics and the associated cellulitis has resolved and the gangrene of the forefoot has in turn become quite dry and the toes are becoming quite mummified. Risks and benefits for angiography with the hope for intervention for limb salvage were reviewed. All questions have been answered. The patient agrees to proceed.   DESCRIPTION OF PROCEDURE: The patient is taken to special procedures and placed in the supine position. After adequate sedation is achieved both groins are prepped and draped in sterile fashion. Appropriate timeout is called.   Ultrasound is placed in a sterile sleeve. The common femoral artery is identified. It is echolucent and pulsatile indicating patency. Images recorded  and under real-time visualization lidocaine is infiltrated and subsequently a microneedle is inserted, microwire followed by micro sheath, J-wire followed by a 5 French sheath and 5 French pigtail catheter.   The pigtail catheter is positioned at the level of T12 and AP projection of the aorta is obtained. Pigtail catheter is repositioned to above the bifurcation and an RAO projection of the pelvis is obtained.   A stiff angled Glidewire and pigtail catheter are then advanced up and over the bifurcation and positioned in the distal external iliac and an LAO projection of the left groin is obtained. Wire is then negotiated into the SFA and the pigtail catheter is advanced. Distal runoff is then obtained through injection within the pigtail. Several areas of moderate to severe disease are noted in the SFA, at the level of the femoral condyles the popliteal artery occludes and remains occluded throughout the trifurcation. The proximal 1/3 of the anterior tibial does reconstitute distally, it is difficult to ascertain whether it remains patent. Similarly the proximal 1/3 of the peroneal is reconstituted, but there is clearly heavy disease with a subtotal occlusion noted in the proximal 1/3 distally, it is ill-defined at this time. The posterior tibial is nonvisualized throughout the course within the images.   4000 units of heparin is given. Wire is negotiated down to the occlusion. A 7 [place] Ra[place] sheath is advanced up and over the bifurcation, positioned with its tip in the proximal SFA. Subsequently an S6 Crosser catheter is positioned at the level of the occlusion and S6 Crosser catheter is then utilized to cross the occluded segment of the popliteal. Re-entry into the peroneal is achieved and as the catheter is advanced hand injection of  contrast is used to confirm intraluminal placement. Next the catheter and wires are advanced down to the subtotal occlusion within the peroneal. Unfortunately at this level  I was unable to cross this lesion because there was a short segment of peroneal that was essentially isolated, I did not feel that angioplasty and further intervention of the popliteal was warranted given this disadvantaged outflow and as the contrast utilization approached 100 and the fluoroscopy time exceeded 20 minutes I felt it better to discontinue the procedure at this point and will plan to attempt reintervention in approximately 1-2 weeks. Sheath and catheter are then pulled into the right. Oblique view is obtained and a StarClose device deployed.   INTERPRETATION: The aorta, common and external iliacs are imaged, there is diffuse disease noted, but there are no hemodynamically significant lesions.   The right common and profunda femoris are also opacified with contrast as is the SFA, all of these arteries are patent, in the SFA there is a 60-70% narrowing at [facility] canal and a similar lesion in the proximal above-knee popliteal. However at the level of the tibial plateau/femoral condyles the popliteal artery occludes and remains occluded throughout the trifurcation. As noted above there is reconstitution of the anterior tibial in its proximal 1/3 as is the peroneal, the peroneal demonstrates a subtotal occlusion on imaging prior to removing the catheters with injection at the level of the knee. Distal imaging of the tibials is obtained. There is a faint small peroneal imaged. There is an anterior tibial that comes down to the level of the ankle, but it is heavily diseased and discontinuous at the level of the ankle. Dorsalis pedis, lateral plantar, and in fact the plantar arch are completely nonvisualized.   Successful crossing of the popliteal lesion was noted, but given the subtotal occlusion of the peroneal just centimeters below I did not feel that unless the peroneal could be crossed further intervention at this time would be helpful, and therefore I have elected to discontinue the procedure. I  will plan to try a slightly modified approach in 1-2 weeks, plan will be to use the 14S catheter instead of the S6 and see if this does not allow for crossing of the peroneal lesion. Of note given the distal images obtained from injection at the level of the knee, there is no potential for a bypass of any sort as there is no continuous runoff.    ____________________________ Renford Dills, MD ggs:bu D: 10/29/2014 14:37:18 ET T: 10/29/2014 15:02:18 ET JOB#: [id number]  cc: Renford Dills, MD, <Dictator> Renford Dills MD ELECTRONICALLY SIGNED 10/30/2014 17:44

## 2015-01-26 NOTE — H&P (Signed)
PATIENT NAME:  [name], [name] MR#:  [id number] DATE OF BIRTH:  1921-05-17  DATE OF ADMISSION:  10/25/2014  PRIMARY CARE PHYSICIAN[facility]  CHIEF COMPLAINT: "My toes are black."  HISTORY OF PRESENT ILLNESS: This is a 81 year old female [place]. Her toes on her left foot had started turning black 2 weeks ago. Pain when moving her foot. She is basically wheelchair-bound at this point. She had a fall about last month and had pneumonia at that time. In the ER, she was found to have 4 toes that were starting to demarcate. Hospitalists services were contacted for further evaluation. The patient also found  to be in rapid atrial fibrillation. The patient takes Coumadin, but no INR was ordered at this point.   PAST MEDICAL HISTORY: Atrial fibrillation, diabetes, foot deformity on the left foot,   PAST SURGICAL HISTORY:  None.  SOCIAL HISTORY: Quit smoking 50 years ago. No alcohol, no drug use. Lives with son.   FAMILY HISTORY: Father committed suicide, had TB. Mother unknown medical history.  ALLERGIES: KEFLEX AND TORADOL .  REVIEW OF SYSTEMS:  CONSTITUTIONAL: Positive for cold type feeling. No fever or chills.  Positive for fatigue. EYES: She does wear glasses and had dry eyes.  EARS, NOSE, AND THROAT: Decreased hearing. No sore throat. No difficulty swallowing. CARDIOVASCULAR: No chest pain, no palpitations. RESPIRATORY: No shortness of breath, no cough, no sputum, no hemoptysis.  GASTROINTESTINAL: No nausea, no vomiting, no abdominal pain, no diarrhea, no constipation, no bright red blood per rectum, no melena. GENITOURINARY: No burning on urination or hematuria.  MUSCULOSKELETAL: Positive for toe pain. INTEGUMENT: Positive for black toes on the left foot.  NEUROLOGICAL: No fainting or blackouts.  PSYCHIATRIC: No anxiety or depression. ENDOCRINE: No thyroid problems. HEMATOLOGIC AND LYMPHATIC: No history of anemia.    PHYSICAL EXAMINATION:  VITAL SIGNS: Temperature 98.7, pulse 108, respirations 17, blood pressure 155/102, pulse oximetry 96% on room air. GENERAL: No respiratory distress.  EYES: Conjunctivae and lids normal. Pupils equal, round, and reactive to light. Extraocular muscles intact. No nystagmus.  EARS, NOSE, AND THROAT: Tympanic membranes: No erythema. Nasal mucosa: No erythema. Throat: No erythema. No exudates seen. Lips and gums: No lesions.  NECK: No JVD, no bruits, no lymphadenopathy, no thyromegaly, no thyroid nodules are palpated. RESPIRATORY: Lungs clear to auscultation.  No use accessory muscles to breathe. No rhonchi, rales, or wheeze heard. CARDIOVASCULAR SYSTEM: S1, S2 irregularly irregular, tachycardic, no gallops or rubs heard. A 2/6 systolic ejection murmur, carotid upstroke 2+ bilaterally, no bruits. Dorsalis pedis pulses unable to be palpated. 2+ edema bilateral lower extremities.  ABDOMEN: Soft, nontender, no organosplenomegaly. Normoactive bowel sounds. No masses felt.  LYMPHATIC: No lymph nodes in the neck.  MUSCULOSKELETAL: 2+ edema, no clubbing, no cyanosis.  SKIN: Left foot, first 2 toes are gangrenous looking and starting to shed off some of the top layers of skin. The 3rd and 4th toes are starting to demarcate.  PSYCHIATRIC: The patient is alert, oriented to person, place, and time.  NEUROLOGICAL: Cranial nerves II through XII are grossly intact.   LABORATORY AND RADIOLOGICAL DATA: Left foot x-ray: Negative. Extensive vascular calcifications.  White blood cell count 11.4, H and H 8.6 and 29.0, platelet count of 457, glucose 116, BUN 23, creatinine 0.97. Sodium 140, potassium 3.8, chloride 103. CO2 32, calcium 9.3. Liver function tests: Albumin low at 2.8.   ASSESSMENT AND PLAN: 1. Clinical sepsis with gangrene of the  toes, peripheral vascular disease, and atrial fibrillation.  The patient takes Coumadin. No INR on the chart. Ordered a stat INR. Depending on that result,  may need to start heparin. We will get a vascular surgery consultation to see what the patient's blood supply is in the lower extremity. My gut feeling, the patient will need more than just a toe amputation. EKG, atrial fibrillation 109 beats per minute. No contraindications to surgery at this time. I will start empirically on vancomycin and meropenem. 2. Rapid atrial fibrillation. The patient does take digoxin. I will check a digoxin level, give a stat dose of metoprolol orally.  The patient is on Coumadin. I will check an INR. Will admit to telemetry. 3. Accelerated hypertension. Will give a stat dose of metoprolol, try to control pain, and continue to monitor.  4. Anemia. I will check iron studies, likely anemia of chronic disease.  5. Diabetes. Will put on sliding scale and hold metformin at this time.   TIME SPENT ON ADMISSION: 55 minutes.  CODE STATUS: The patient is a DNR.   ____________________________ Herschell Dimes. Renae Gloss, MD rjw:[id number] D: 10/25/2014 16:45:00 ET T: 10/25/2014 17:05:44 ET JOB#: [id number]  cc: Herschell Dimes. Renae Gloss, MD, <Dictator> Dr. Gerarda Gunther Dr. Daphene Jaeger MD ELECTRONICALLY SIGNED 10/28/2014 15:44

## 2015-01-26 NOTE — Consult Note (Signed)
PATIENT NAME:  [name], [name] MR#:  [id number] DATE OF BIRTH:  January 03, 1921  DATE OF CONSULTATION:  11/12/2014  REFERRING PHYSICIAN:  Renford DillsGregory G. Schnier, MD CONSULTING PHYSICIAN:  Linus Galasodd Karyssa Amaral, DPM  REASON FOR CONSULTATION: This is a 81 year old female who recently [place] the [place] and was admitted to the hospital a couple of weeks ago with a foot infection and vascular disease. She underwent a vascular procedure. Earlier today, she was having a repeat procedure for stenting of the artery in the left leg and was then admitted because of progressive gangrenous changes to digits 1-4 on the left foot involving the entire forefoot. Revascularization was successful, and she is consulted for evaluation of amputation of the forefoot.   PAST MEDICAL HISTORY: 1.  Atrial fibrillation.  2.  Diabetes.  3.  Gangrene, left forefoot.   SURGICAL HISTORY: None.  Could not obtain history from the patient.   REVIEW OF SYSTEMS: Difficult to obtain because the patient is fairly sedated. She did deny any fever or chills. Denies any nausea or vomiting. Does have significant pain with her left foot and leg.   MEDICATIONS: 1.  Vitamin D3, 2000 International Units once daily.  2.  Quinapril 10 mg oral tablet once a day.  3.  Potassium chloride 20 mEq once a day.  4.  Oxycodone 10 mg every 12 hours.  5.  Oxycodone 5 mg every 6 hours as needed for pain.  6.  Metoprolol 100 mg 2 times a day.  7.  Metformin 1000 mg oral 2 times daily.  8.  Furosemide 40 mg oral daily.  9.  Docusate sodium 100 mg 2 times a day.  10.  Digoxin 125 mcg oral once a day.  11.  Combivent 1 puff inhalation 4 times daily.  12.  Apixaban 5 mg oral tablet 2 times a day.   ALLERGIES: AMOXICILLIN, KEFLEX, PREDNISOLONE, TORADOL, TRAMADOL.   FAMILY HISTORY: Father had tuberculosis and committed suicide. Mother is unknown.   SOCIAL HISTORY: Recently moved to the area to live with her son from [place]. No alcohol. Distant history of  tobacco use.   PHYSICAL EXAMINATION: VASCULAR: DP and PT pulses are palpable bilateral. There is capillary refill at the mid foot level on the left foot, but does appear a little delayed. Gangrenous changes distally.  NEUROLOGICAL: Does appear to have protective threshold intact to the left mid foot, but none distal to the left toe area. Appears to be intact on the right foot. Difficult to assess due to the patient's current state.  INTEGUMENT: Skin is warm, dry, and somewhat atrophic. Significant bilateral edema. Some erythema in the left foot and leg. Dry gangrenous changes noted to digits 1-4 on the left foot extending just onto the distal forefoot. Line of demarcation plantarly at about the metatarsophalangeal joints.  MUSCULOSKELETAL: Guarded range of motion on the left secondary to pain. Muscle testing is deferred.   IMPRESSION: 1.  Peripheral vascular disease with dry gangrenous changes, left forefoot.  2.  Diabetes, appears to have intact sensation.   PLAN: I tried to discuss with the patient the need for either amputation of the forefoot versus a more proximal amputation. She seemed to be fairly nonresponsive and in and out of consciousness and did not really grasp the conversation. We will obtain x-rays for evaluation of the left foot. We will hold her Plavix at this point and switch back to the heparin until we can finalize what type of surgery and when this  will be performed. At this point I would like to plan for around Friday. We will evaluate the patient over the next day or 2, and try to have a more thorough discussion about her planned surgery.    ____________________________ Linus Galas, DPM tc:LT D: 11/12/2014 19:30:28 ET T: 11/12/2014 19:59:40 ET JOB#: [id number]  cc: Linus Galas, DPM, <Dictator> Miloh Alcocer DPM ELECTRONICALLY SIGNED 11/27/2014 9:11

## 2015-01-26 NOTE — Op Note (Signed)
PATIENT NAMNolon Nations:  [name], [name] MR#:  [id number] DATE OF BIRTH:  Sep 15, 1921  DATE OF PROCEDURE:  11/12/2014  PREOPERATIVE DIAGNOSIS: Atherosclerotic occlusive disease, bilateral lower extremities, with gangrene of the left forefoot.   POSTOPERATIVE DIAGNOSIS: Atherosclerotic occlusive disease, bilateral lower extremities, with gangrene of the left forefoot.   PROCEDURES PERFORMED: 1. Angiography of the left lower extremity, third order catheter placement.  2. Additional third order catheter placement.  3. Crosser atherectomy with angioplasty and stent placement, left popliteal.  4. Crosser atherectomy of the left peroneal with angioplasty.  5. A percutaneous transluminal angioplasty of the left AT.   SURGEON: Renford DillsGregory G. Karolyne Timmons, MD   SEDATION: Versed plus fentanyl.   CONTRAST USED: Isovue 120 mL.   FLUOROSCOPY TIME: 29.1 minutes.   INDICATIONS: [name] is a 81 year old woman who presented with gangrenous changes to the forefoot. Risks and benefits for angiography and intervention were reviewed. All questions answered. The patient agrees to proceed.   DESCRIPTION OF PROCEDURE: The patient is taken to the special procedure suite, placed in the supine position. After adequate sedation is achieved, both groins are prepped and draped in a sterile fashion. Lidocaine 1% is infiltrated in the soft tissues, and access to the right common femoral artery is obtained with ultrasound guidance. The femoral artery is echolucent and pulsatile, indicating patency. Image is recorded for the permanent record, and the puncture is made with real-time visualization.   Using a stiff angled Glidewire and catheters, the aortic bifurcation is crossed and the catheter is negotiated down into the SFA. Angiography is then obtained. Occlusion of the popliteal with several high-grade stenoses of the distal SFA is noted. There is reconstitution of the anterior tibial at the curve. Approximately 1 cm from its origin,  the proximal 1 cm is occluded. There is reconstitution of the peroneal, as was previously documented.   Heparin 5000 units is given and a stiff-angled Glidewire is negotiated down to the popliteal occlusion, and a 7 [place] Raby sheath is advanced up and over the bifurcation.   Beginning with a 14S device, the popliteal occlusion is negotiated. The 14S is then continued down into the peroneal. Verification of intraluminal positioning within the proximal peroneal is made, and then the 14S is advanced over the wire through a secondary stenosis. This was the stenosis that was problematic on her first angioplasty several weeks ago. Having successfully crossed this, a V-18 wire is introduced and angioplasty to 3 mm is performed from the peroneal through the popliteal. Subsequently, a 4 mm Lutonix balloon is used in the popliteal. Imaging now shows patency, but very sluggish flow, and I have elected, because of this, to address the anterior tibial. Using an angled catheter and an Advantage wire, the anterior tibial occlusion is crossed and the wire catheter is negotiated into the anterior tibial. Hand injection of contrast  representing the additional third order placement demonstrates, distally, there is diffuse disease at multiple levels. Ultimately, the wire is negotiated down to the dorsalis pedis and a 2.5 mm x 22 cm balloon is used to angioplasty distally. Proximally, angioplasty is performed with a 3 mm balloon and then actually a 4 mm Lutonix at the origin.   A 5 mm Lutonix is then used to angioplasty the SFA. Follow-up imaging now demonstrates, again, there is this persistent sluggish flow, although hand injection through catheters over a wire with a Tuohy-Borst demonstrate that there is patency. There is a severe dissection noted in the popliteal, extending down to the level of the  anterior tibial. The anterior tibial is clearly the dominant runoff and, therefore, a LifeStent is deployed approximately 1 cm  into the anterior tibial. It is a 5 x 10 LifeStent posted to 5 in the popliteal and 4 in the tibial.   Final imaging demonstrates that the AT is patent down to the foot. There is diffuse high-grade small vessel disease noted, as well.   The catheter was pulled into the right groin, oblique view is obtained, and a StarClose device deployed, and there are no immediate complications.   INTERPRETATION: The abdominal aorta was imaged recently and, therefore, is not imaged again. The bifurcation is crossed with a small hand injection of contrast to localize it, and the external and common iliac arteries are widely patent. The left common femoral profunda femoris is patent. Superficial femoral demonstrates diffuse high-grade stenosis distally and then occlusion of the popliteal. There is reconstitution of the anterior tibial, with diffuse distal disease. There is reconstitution of the peroneal as well. Following intervention, there is now patency, but the anterior tibial is clearly dominant and this one is selected as the stent is deployed, extending into it because of a dissection in the popliteal.   SUMMARY:  1. Crosser atherectomy with angioplasty and stent placement of the popliteal. Lutonix balloon was used in the popliteal, as well as the distal superficial femoral artery.  2. Crosser atherectomy with angioplasty of the peroneal.  3. Angioplasty of the anterior tibial.    ____________________________ Renford Dills, MD ggs:mw D: 11/13/2014 12:30:18 ET T: 11/13/2014 19:30:22 ET JOB#: [id number]  cc: Renford Dills, MD, <Dictator> Renford Dills MD ELECTRONICALLY SIGNED 12/03/2014 14:25

## 2015-01-26 NOTE — Consult Note (Signed)
PATIENT NAME:  [name], [name] MR#:  [id number] DATE OF BIRTH:  March 17, 1921  DATE OF CONSULTATION:  10/28/2014  REFERRING PHYSICIAN:  Herschell Dimesichard J. Renae GlossWieting, MD CONSULTING PHYSICIAN:  Dwayne D. Juliann Paresallwood, MD  DATE OF BIRTH: March 17, 1921.   INDICATION: Atrial fibrillation, shortness of breath, PVD with gangrene.      HISTORY OF PRESENT ILLNESS: The patient is a 81 year old female who recently moved from [place]. She states the toes on the left foot began turning black about 2 weeks ago. She complained of pain while moving her foot. She is basically wheelchair bound. She fell about a month ago, had pneumonia at that time. In the Emergency Room, she was found to have 4 toes there were starting to look worse. The hospitalist service was contacted for evaluation. She was found to be in rapid atrial fibrillation. She takes Coumadin, but no INR was ordered. She complained of foot pain. No chest pain. She was short of breath and then came to the hospital for evaluation.   PAST MEDICAL HISTORY: Atrial fibrillation, diabetes, foot deformity on the left.   PAST SURGICAL HISTORY: Essentially none.   SOCIAL HISTORY: Quit smoking 50 years ago. No alcohol consumption. Retired. Lives with her son. Recently moved from [place].   FAMILY HISTORY: Father committed suicide, had TB. Mother: No know medical history.   ALLERGIES: KEFLEX, TORADOL.   REVIEW OF SYSTEMS: Palpitations, tachycardia, discoloration of the toes. Denies blackout spells or syncope. No nausea or vomiting. No fever. No chills. No sweats. No weight loss. No weight gain. No hemoptysis or hematemesis. Denies bright red blood per rectum. No vision change or hearing change. Denies any significant sputum production or cough.   PHYSICAL EXAMINATION:  VITAL SIGNS: Blood pressure 150/100, pulse of 100 and regular, respiratory rate 16, afebrile.  HEENT: Normocephalic, atraumatic. Pupils equal and reactive to light.  NECK: Supple. No significant JVD, bruits  or adenopathy.  LUNGS: Clear to auscultation and percussion. No significant wheeze, rhonchi or rale. HEART: Irregularly irregular. Systolic ejection murmur left sternal border.  ABDOMEN: Benign.  EXTREMITIES: Decreased pulses with severe discoloration of the left toes suggestive of dry gangrene.  NEUROLOGIC: Intact.  SKIN: Normal.   DIAGNOSTIC DATA: X-ray of the foot: Negative  chest x-ray vascular calcification.   LABORATORY DATA: White count 11, H and H of 8.6 and 29, platelet count of 457,000. Glucose 116, BUN 23, creatinine 0.97, sodium 140, potassium 3.8, chloride 103, CO2 of 32, calcium 9.3. LFTs negative. Albumin 2.8.   EKG: Rapid atrial fibrillation, rate of  100 nonspecific ST-T changes never complex  ASSESSMENT:  Atrial fibrillation, rapid ventricular response; shortness of breath; hypertension; anemia; diabetes; peripheral vascular disease with possible gangrene; degenerative joint disease.   PLAN: Agree with admit. Place on telemetry. Rate control. Anticoagulation. Continue pain management control. Recommend consult vascular. The patient will probably need amputation. Consider whether this is related to peripheral vascular disease or thrombus from atrial fibrillation, which I doubt. Would recommend long-term anticoagulation possibly if she is not a contraindication. Blood pressure control for elevated blood pressure. Follow up anemia. Heme check stools. Continue diabetes management. Consider holding metformin in anticipation of angiogram. Recommend mild weight loss. We will base further evaluation on vascular input. Echocardiogram of the heart will be helpful as well. We will continue to follow the patient.    ____________________________ Bobbie Stackwayne D. Juliann Paresallwood, MD [id number] D: 10/28/2014 14:56:28 ET T: 10/28/2014 15:52:45 ET JOB#: [id number]  cc: Dwayne D. Juliann Paresallwood, MD, <Dictator> DWAYNE Salome Arnt CALLWOOD MD ELECTRONICALLY SIGNED  11/05/2014 17:29 

## 2015-01-26 NOTE — Discharge Summary (Signed)
PATIENT NAMNolon Nations:  [name], [name] MR#:  [id number] DATE OF BIRTH:  [birth year]  DATE OF ADMISSION:  11/14/2014 DATE OF DISCHARGE: 11/20/2014  ADDENDUM  The patient did not go on 22nd because she was hypoxic. O2 saturations were 88% on room air. The chest x-ray showed some pulmonary edema. I gave her Lasix 40 mg every 12 hours for 4 doses. She also had some cough and phlegm, so started on Levaquin and nebulizers. Today, she feels much better and she is saturating 94 on 1 liter. She is afebrile, so we will let her go to rehab and she is going to [facility]. The patient's granddaughter is at the bedside. Updated her patient discharge medications were reviewed.  . The patient will see Dr.Todd  Clide CliffKline  in about 2 week.   PHYSICAL EXAMINATION: DISCHARGE VITAL SIGNS: Temperature is 98 Fahrenheit, heart rate 96, blood pressure 160/69 sats 93% on 2 liters. GENERAL: The patient is alert, awake, oriented.  CARDIOVASCULAR SYSTEM: S1, S2 regular.  LUNGS: Clear to auscultation.  EXTREMITIES: Left leg dressing present and also some heel boots are present. On the right leg, she has a skin irritation on the heel on the right foot. Other than that, no other changes.  NEUROLOGIC: Alert, awake, oriented. Cranial nerves II through XII intact.power 5/5 upper extremities, The patient is stable and will go to rehab today.   TIME SPENT: More than 30 minutes.  ____________________________ Katha HammingSnehalatha Melondy Blanchard, MD sk:ap D: 11/20/2014 12:01:00 ET T: 11/20/2014 12:28:36 ET JOB#: [id number]  cc: Katha HammingSnehalatha Katrese Shell, MD, <Dictator> Katha HammingSNEHALATHA Quinita Kostelecky MD ELECTRONICALLY SIGNED 12/02/2014 13:42

## 2015-01-26 NOTE — Consult Note (Signed)
PATIENT NAME:  [name], [name] MR#:  [id number] DATE OF BIRTH:  1920/12/29  DATE OF CONSULTATION:  11/12/2014  REFERRING PHYSICIAN:  Renford DillsGregory G. Schnier, MD  CONSULTING PHYSICIAN:  Marcina MillardAlexander Lionardo Haze, MD  PRIMARY CARE PHYSICIAN. Teena Iraniavid M. Terance HartBronstein, MD   REASON FOR CONSULTATION: Preoperative cardiovascular evaluation.   CHIEF COMPLAINT: Gangrene of left great toe.   HISTORY OF PRESENT ILLNESS: The patient is a 81 year old female with history of chronic atrial fibrillation, congestive heart failure, hypertension, and peripheral vascular disease. The patient apparently underwent recent PTA of the popliteal artery 2 weeks ago with gangrene of left great toe. Today, the patient underwent stent of the popliteal artery. The patient denies chest pain or shortness of breath. She has obvious gangrene of the left great.   PAST MEDICAL HISTORY: 1. Chronic atrial fibrillation.  2. Congestive heart failure.  3. Hypertension.  4. Peripheral vascular disease.  5. Type 2 diabetes.   MEDICATIONS: Lanoxin 0.125 mg daily, furosemide 40 mg daily, quinapril 10 mg daily, Eliquis 5 mg b.i.d., metformin 1000 mg b.i.d. with meals, cholecalciferol 2000 units daily.   SOCIAL HISTORY: The patient was recently a resident of [place], [place], recently moved here.   FAMILY HISTORY: No immediate family history for coronary artery disease or myocardial infarction.   REVIEW OF SYSTEMS:  CONSTITUTIONAL: No fever or chills.  EYES: No blurry vision.  EARS: No hearing loss.  RESPIRATORY: No shortness of breath.  CARDIOVASCULAR: No chest pain.  GASTROINTESTINAL: No nausea, vomiting, or diarrhea.  GENITOURINARY: No dysuria or hematuria.  ENDOCRINE: No polyuria or polydipsia.  MUSCULOSKELETAL: No arthralgias or myalgias.  NEUROLOGIC: No focal muscle weakness or numbness.  PSYCHOLOGICAL: No depression or anxiety.   PHYSICAL EXAMINATION: VITAL SIGNS: Blood pressure is 181/79, pulse 103, respirations 21, temperature 97.8,  pulse oximetry 90%.  HEENT: Pupils equal, reactive to light and accommodation.  NECK: Supple without thyromegaly.  LUNGS: Clear.  HEART: Normal JVP. Normal PMI. Irregularly irregular rhythm. Normal S1, S2. No appreciable gallop, murmur, or rub.  ABDOMEN: Soft and nontender. Pulses were intact bilaterally.  MUSCULOSKELETAL: Normal muscle tone.  NEUROLOGIC: The patient is alert and oriented x 3. Motor and sensory both grossly intact.   IMPRESSION: A 81 year old female, referred initially for preoperative cardiovascular evaluation; however, the patient just completed stent to popliteal artery with gangrene of the left foot.   RECOMMENDATIONS: 1. I agree with overall current therapy.  2. Continue to do antiplatelet therapy at this time.  3. Continue metoprolol and digoxin for rate control.  4. No further cardiac diagnostics at this time.    ____________________________ Marcina MillardAlexander Mariyah Upshaw, MD ap:mw D: 11/12/2014 15:53:18 ET T: 11/12/2014 17:13:38 ET JOB#: [id number]  cc: Marcina MillardAlexander Nefertiti Mohamad, MD, <Dictator> Marcina MillardALEXANDER Marshal Eskew MD ELECTRONICALLY SIGNED 11/19/2014 14:38

## 2015-01-26 NOTE — Consult Note (Signed)
Brief Consult Note: Diagnosis: Gangrene, PVD- medical issues management.   Patient was seen by consultant.   Consult note dictated.   Orders entered.   Comments: Will continue to manage medical issues- Pt need cardiology clearance for surgery.  Electronic Signatures: Altamese DillingVachhani, Advaith Lamarque (MD)  (Signed 16-Feb-16 14:18)  Authored: Brief Consult Note   Last Updated: 16-Feb-16 14:18 by Altamese DillingVachhani, Cindie Rajagopalan (MD)

## 2015-01-26 NOTE — Consult Note (Signed)
Chief Complaint:  Subjective/Chief Complaint Patient still has left foot pain weight is complete demarcated  gangrene and decreased circulation   VITAL SIGNS/ANCILLARY NOTES: **Vital Signs.:   02-Feb-16 11:21  Vital Signs Type Routine  Temperature Temperature (F) 98.6  Celsius 37  Temperature Source oral  Pulse Pulse 107  Respirations Respirations 20  Systolic BP Systolic BP 537  Diastolic BP (mmHg) Diastolic BP (mmHg) 84  Mean BP 106  Pulse Ox % Pulse Ox % 95  Pulse Ox Activity Level  At rest  Oxygen Delivery 2L  *Intake and Output.:   02-Feb-16 11:33  Grand Totals Intake:   Output:  200    Net:  -200 24 Hr.:  -200  Urine ml     Out:  200  Urinary Method  Void; Bedpan   Brief Assessment:  GEN well developed, well nourished, no acute distress   Cardiac Irregular  murmur present   Respiratory normal resp effort  clear BS   Gastrointestinal Normal   Gastrointestinal details normal Soft  Nontender   EXTR positive cyanosis/clubbing, negative edema, dry gangrene of the left foot   Lab Results: LabObservation:  01-Feb-16 14:27   OBSERVATION Reason for Test  TDMs:  02-Feb-16 01:30   Vancomycin, Trough LAB 11 (Result(s) reported on 29 Oct 2014 at 04:16AM.)  Cardiology:  01-Feb-16 14:27   Echo Doppler REASON FOR EXAM:     COMMENTS:     PROCEDURE: [facility] - ECHO DOPPLER COMPLETE(TRANSTHOR)  - Oct 28 2014  2:27PM   RESULT: Echocardiogram Report  Patient Name:   [name] Date of Exam: 10/28/2014 Medical Rec #:  [id number]         Custom1: Date of Birth:  November 27, 1920       Height:       67.0 in Patient Age:    81 years       Weight:       182.0 lb Patient Gender: F              BSA:          1.94 m??  Indications: Atrial Fib Sonographer:    Sherrie Sport RDCS Referring Phys: Loletha Grayer, J  Summary:  1. Left ventricular ejection fraction, by visual estimation, is 55 to  60%.  2. Normal global left ventricular systolic function.  3. Mildly increased left  ventricular septal thickness.  4. Right ventricular volume overload.  5. Moderately dilated left atrium.  6. Mildly dilated right atrium.  7. Mild to moderate mitral valve regurgitation.  8. Mild aortic regurgitation.  9. Mild to moderate aortic valve stenosis. 10. Moderately elevated pulmonary artery systolic pressure. 11. Mild to moderate tricuspid regurgitation. 12. Mildly increased left ventricular posterior wall thickness. 2D AND M-MODE MEASUREMENTS (normal ranges within parentheses): Left Ventricle:          Normal IVSd (2D):      1.30 cm (0.7-1.1) LVPWd (2D):     1.24 cm (0.7-1.1) Aorta/LA:                  Normal LVIDd (2D):     4.31 cm (3.4-5.7) Aortic Root (2D): 2.90 cm (2.4-3.7) LVIDs (2D):     3.07 cm           Left Atrium (2D): 5.30 cm (1.9-4.0) LV FS (2D):     28.8 %   (>25%) LV EF (2D):    55.7 %   (>50%)  Right Ventricle:                                   RVd (2D):        3.66 cm LV DIASTOLIC FUNCTION: MV Peak E: 0.89 m/s E/e' Ratio: 12.20                     Decel Time: 204 msec SPECTRAL DOPPLER ANALYSIS (where applicable): Mitral Valve: MV P1/2 Time: 59.16 msec MV Area, PHT: 3.72 cm?? Aortic Valve: AoV Max Vel: 1.68 m/s AoV Peak PG: 11.3 mmHg AoV Mean PG:  6.3 mmHg LVOT Vmax: 0.53 m/s LVOT VTI: 0.110 m LVOT Diameter: 2.00 cm AoV Area, Vmax: 0.98 cm?? AoV Area, VTI: 1.07 cm?? AoV Area, Vmn: 1.04 cm?? Tricuspid Valve and PA/RV Systolic Pressure: TR Max Velocity: 3.45 m/s RA  Pressure: 10 mmHg RVSP/PASP: 57.5 mmHg Pulmonic Valve: PV Max Velocity: 0.75 m/s PV Max PG: 2.3 mmHg PV Mean PG:  PHYSICIAN INTERPRETATION: Left Ventricle: The left ventricular internal cavity size was normal. LV  septal wall thickness was mildly increased. LV posterior wall thickness   was mildly increased. Global LV systolic function was normal.Left  ventricular ejection fraction, by visual estimation, is 55 to 60%. The  interventricular septum is  flattened in diastole ('D' shaped left  ventricle), consistent with right ventricular volume overload. Right Ventricle: The right ventricularsize is normal. Global RV systolic  function is normal. Left Atrium: The left atrium is moderately dilated. Right Atrium: The right atrium is mildly dilated. Pericardium: There is no evidence of pericardial effusion. Mitral Valve: The mitral valve is normal in structure. Mild to moderate  mitral valve regurgitation is seen. Tricuspid Valve: The tricuspid valve is normal. Mild to moderate  tricuspid regurgitation is visualized. The tricuspid regurgitant velocity  is 3.45 m/s, and with an assumed right atrial pressure of 10 mmHg, the  estimated right ventricular systolic pressure is moderately elevated at   57.5 mmHg. Aortic Valve: The aortic valve is normal. Mild to moderate aortic  stenosis is present. Mild aortic valve regurgitation is seen. Pulmonic Valve: The pulmonic valve is normal. No indication of pulmonic  valve regurgitation.  Goldsby MD Electronically signed by Sumrall MD Signature Date/Time: 10/28/2014/6:28:09 PM  *** Final ***  IMPRESSION: .   Verified By: Yolonda Kida, M.D., MD  Routine Chem:  01-Feb-16 05:16   Result Comment HGB/HCT - RESULTS VERIFIED BY REPEAT TESTING.  Result(s) reported on 28 Oct 2014 at 06:40AM.    13:38   Creatinine (comp) 0.97  eGFR (African American) >60  eGFR (Non-African American)  57 (eGFR values <72mL/min/1.73 m2 may be an indication of chronic kidney disease (CKD). Calculated eGFR, using the MRDR Study equation, is useful in  patients with stable renal function. The eGFR calculation will not be reliable in acutely ill patients when serum creatinine is changing rapidly. It is not useful in patients on dialysis. The eGFR calculation may not be applicable to patients at the low and high extremes of body sizes, pregnant women, and vegetarians.)  02-Feb-16 01:30    Creatinine (comp) 0.87  eGFR (African American) >60  eGFR (Non-African American) >60 (eGFR values <72mL/min/1.73 m2 may be an indication of chronic kidney disease (CKD). Calculated eGFR, using the MRDR Study equation, is useful in  patients with stable renal function. The eGFR calculation will not be reliable in acutely ill patients when serum creatinine  is changing rapidly. It is not useful in patients on dialysis. The eGFR calculation may not be applicable to patients at the low and high extremes of body sizes, pregnant women, and vegetarians.)  Routine Hem:  01-Feb-16 05:16   WBC (CBC)  14.1  RBC (CBC) 4.50  Hemoglobin (CBC)  9.5  Hematocrit (CBC)  32.1  Platelet Count (CBC)  502  MCV  71  MCH  21.1  MCHC  29.6  RDW  22.1  Neutrophil % 69.7  Lymphocyte % 22.3  Monocyte % 7.4  Eosinophil % 0.3  Basophil % 0.3  Neutrophil #  9.8  Lymphocyte # 3.1  Monocyte #  1.0  Eosinophil # 0.0  Basophil # 0.0   Radiology Results: XRay:    29-Jan-16 13:59, Foot Left Complete  Foot Left Complete   REASON FOR EXAM:    painful, red/purple toes  COMMENTS:       PROCEDURE: DXR - DXR FOOT LT COMP W/OBLIQUES  - Oct 25 2014  1:59PM     CLINICAL DATA:  Pain and swelling.    EXAM:  LEFT FOOT - COMPLETE 3+ VIEW    COMPARISON:  None.    FINDINGS:  Severeand diffuse calcifications involving the arteries of the  ankle and foot. There is also diffuse soft tissue calcifications  which could be due to dermatomyositis. No destructive bone changes  to suggest osteomyelitis. I do not see any obvious gas in the soft  tissues. Moderate degenerative changes and osteoporosis.     IMPRESSION:  No definite plain film findings for osteomyelitis.    Extensive vascular calcifications.      Electronically Signed    By: Kalman Jewels M.D.    On: 10/25/2014 14:27      Verified By: Marlane Hatcher, M.D.,    31-Jan-16 10:54, Chest PA and Lateral  Chest PA and Lateral   REASON FOR EXAM:     Hypoxia. [name]  COMMENTS:       PROCEDURE: DXR - DXR CHEST PA (OR AP) AND LATERAL  - Oct 27 2014 10:54AM     CLINICAL DATA:  81 year old female with history of hypoxia and  wheezing today. Painful left foot. Diabetic patient.    EXAM:  CHEST  2 VIEW    COMPARISON:  No priors.    FINDINGS:  Low lung volumes. Bibasilar opacities favored to reflect areas of  subsegmental atelectasis with small bilateral pleural effusions.  Mild cephalization of the pulmonary vasculature with indistinct  interstitial markings suggesting a background of mild interstitial  pulmonary edema. Visual thickening. Mild cardiomegaly. Upper  mediastinal contours are distorted by patient positioning.  Atherosclerosis in the thoracic aorta.     IMPRESSION:  1. The appearance of the chest suggests mild congestive heart  failure, as above.      Electronically Signed    By: Vinnie Langton M.D.    On: 10/27/2014 11:33     Verified By: Etheleen Mayhew, M.D.,  Cardiology:    29-Jan-16 12:08, ECG  Ventricular Rate 109  Atrial Rate 113  QRS Duration 74  QT 296  QTc 398  R Axis 55  T Axis 79  ECG interpretation   Atrial fibrillation with rapid ventricular response  Low voltage QRS  Nonspecific T wave abnormality , probably digitalis effect  Abnormal ECG  When compared with ECG of 27-Nov-2005 20:10,  No significant change was found  Confirmed by Humphrey Rolls, SHAUKAT ([id number]) on 10/28/2014 2:37:02 PM    Overreader: Neoma Laming  ECG     01-Feb-16 14:27, Echo Doppler  Echo Doppler   REASON FOR EXAM:      COMMENTS:       PROCEDURE: [facility] - ECHO DOPPLER COMPLETE(TRANSTHOR)  - Oct 28 2014  2:27PM     RESULT: Echocardiogram Report    Patient Name:   [name] Date of Exam: 10/28/2014  Medical Rec #:  [id number]         Custom1:  Date of Birth:  [birth year]       Height:       67.0 in  Patient Age:    25 years       Weight:       182.0 lb  Patient Gender: F              BSA:          1.94  m??    Indications: Atrial Fib  Sonographer:    Sherrie Sport RDCS  Referring Phys: Loletha Grayer, J    Summary:   1. Left ventricular ejection fraction, by visual estimation, is 55 to   60%.   2. Normal global left ventricular systolic function.   3. Mildly increased left ventricular septal thickness.   4. Right ventricular volume overload.   5. Moderately dilated left atrium.   6. Mildly dilated right atrium.   7. Mild to moderate mitral valve regurgitation.   8. Mild aortic regurgitation.   9. Mild to moderate aortic valve stenosis.  10. Moderately elevated pulmonary artery systolic pressure.  11. Mild to moderate tricuspid regurgitation.  12. Mildly increased left ventricular posterior wall thickness.  2D AND M-MODE MEASUREMENTS (normal ranges within parentheses):  Left Ventricle:          Normal  IVSd (2D):      1.30 cm (0.7-1.1)  LVPWd (2D):     1.24 cm (0.7-1.1) Aorta/LA:                  Normal  LVIDd (2D):     4.31 cm (3.4-5.7) Aortic Root (2D): 2.90 cm (2.4-3.7)  LVIDs (2D):     3.07 cm           Left Atrium (2D): 5.30 cm (1.9-4.0)  LV FS (2D):     28.8 %   (>25%)  LV EF (2D):    55.7 %   (>50%)                                    Right Ventricle:                                    RVd (2D):        8.33 cm  LV DIASTOLIC FUNCTION:  MV Peak E: 0.89 m/s E/e' Ratio: 12.20                      Decel Time: 204 msec  SPECTRAL DOPPLER ANALYSIS (where applicable):  Mitral Valve:  MV P1/2 Time: 59.16 msec  MV Area, PHT: 3.72 cm??  Aortic Valve: AoV Max Vel: 1.68 m/s AoV Peak PG: 11.3 mmHg AoV Mean PG:   6.3 mmHg  LVOT Vmax: 0.53 m/s LVOT VTI: 0.110 m LVOT Diameter: 2.00 cm  AoV Area, Vmax: 0.98 cm?? AoV Area, VTI: 1.07 cm?? AoV Area, Vmn: 1.04 cm??  Tricuspid  Valve and PA/RV Systolic Pressure: TR Max Velocity: 3.45 m/s RA   Pressure: 10 mmHg RVSP/PASP: 57.5 mmHg  Pulmonic Valve:  PV Max Velocity: 0.75 m/s PV Max PG: 2.3 mmHg PV Mean PG:    PHYSICIAN INTERPRETATION:  Left  Ventricle: The left ventricular internal cavity size was normal. LV   septal wall thickness was mildly increased. LV posterior wall thickness     was mildly increased. Global LV systolic function was normal.Left   ventricular ejection fraction, by visual estimation, is 55 to 60%. The   interventricular septum is flattened in diastole ('D' shaped left   ventricle), consistent with right ventricular volume overload.  Right Ventricle: The right ventricularsize is normal. Global RV systolic   function is normal.  Left Atrium: The left atrium is moderately dilated.  Right Atrium: The right atrium is mildly dilated.  Pericardium: There is no evidence of pericardial effusion.  Mitral Valve: The mitral valve is normal in structure. Mild to moderate   mitral valve regurgitation is seen.  Tricuspid Valve: The tricuspid valve is normal. Mild to moderate   tricuspid regurgitation is visualized. The tricuspid regurgitant velocity   is 3.45 m/s, and with an assumed right atrial pressure of 10 mmHg, the   estimated right ventricular systolic pressure is moderately elevated at     57.5 mmHg.  Aortic Valve: The aortic valve is normal. Mild to moderate aortic   stenosis is present. Mild aortic valve regurgitation is seen.  Pulmonic Valve: The pulmonic valve is normal. No indication of pulmonic   valve regurgitation.    [place] MD  Electronically signed by [id number] Lujean Amel MD  Signature Date/Time: 10/28/2014/6:28:09 PM    *** Final ***    IMPRESSION: .      Verified By: Yolonda Kida, M.D., MD   Assessment/Plan:  Assessment/Plan:  Assessment atrial fibrillation  gangrene of the left foot  peripheral vascular disease  hypertension  diabetes  coagulopathy on Coumadin  anemia .   Plan agree with vascular input for gangrene  continue anticoagulation with Coumadin for atrial fibrillation  continue rate control for atrial fibrillation  oxygen therapy for mild shortness  of breath  continue pain control for left foot  agree with echocardiogram for source   thrombus  continue telemetry for atrial fibrillation   Electronic Signatures: Lujean Amel D (MD)  (Signed 02-Feb-16 18:04)  Authored: Chief Complaint, VITAL SIGNS/ANCILLARY NOTES, Brief Assessment, Lab Results, Radiology Results, Assessment/Plan   Last Updated: 02-Feb-16 18:04 by Lujean Amel D (MD)

## 2015-01-26 NOTE — Discharge Summary (Signed)
PATIENT NAMNolon [name]:  [name], [name] [name] MR#:  [id number] DATE OF BIRTH:  07-19-1921  DATE OF ADMISSION:  11/14/2014 DATE OF DISCHARGE:  11/18/2014  DISPOSITION: To [facility].  DISCHARGE DIAGNOSES: 1.  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 Estimated blood loss is 50 mL.  2.  Type 2 diabetes mellitus. 3.  Peripheral vascular disease. 4.  Chronic atrial fibrillation.  DISCHARGE MEDICATIONS: 1.  Vitamin D3, 2000 units once a day. 2.  Digoxin 125 mcg p.o. daily.  3.  Metformin 1 g p.o. b.i.d. 4.  Apixaban 5 mg p.o. b.i.d. 5.  Metoprolol 100 mg p.o. b.i.d. 6.  Colace 100 mg p.o. b.i.d. as needed.  7.  Combivent 1 puff 4 times daily. 8.  Quinapril 10 mg p.o. daily. 9.  Furosemide 20 mg p.o. daily.  10.  Potassium chloride 10 mEq p.o. daily. 11.  oxycodone  5 mg every 6 hours as needed for moderate pain; 30 tablets are given. 12.  Collagenase 250 units topical ointment once a day to the left foot. 13.  Levaquin 250 mg every 24 hours for 10 days.  DIET: Low-sodium, low-fat, ADA diet.  CONSULTATIONS: 1.  Podiatry consult with Linus Galasodd Cline, DPM.  2.  Cardiology consult as well.  HOSPITAL COURSE: The patient is a 81 year old female patient referred from Dr. Marijean HeathSchnier's service for gangrene. The patient has PVD; with gangrene of the left forefoot. The patient had angiogram of the left leg and had angioplasty and stent placed in the left popliteal, and because of her dry gangrene the patient was referred to medical admission. Dr. Elisabeth PigeonVachhani did the consultation but she is admitted on the medical service. The patient has other medical problems of diabetes, chronic atrial fibrillation, and hypertension. The patient admitted to medical service. She was on Eliquis, so we stopped the Eliquis. For her dry foot gangrene, seen by Dr. Alberteen Spindleline from podiatry. The patient was evaluated by vascular because her toes were turning black. [place] came here because she wanted to stay with her daughter. 1.  This is a 81 year old female with chronic atrial fibrillation, diabetes, admitted 2 weeks ago for right foot gangrene, and patient was given antibiotics and discharged [facility], then came back again because the patient had to have a vascular intervention with Dr. Gilda CreaseSchnier for left leg angiogram with popliteal stent and then admitted to medical service for left foot dry gangrene. She had amputation, TMA, done by Dr. Linus Galasodd Cline and we monitored her postoperatively. Eliquis was stopped for this procedure. The patient's Eliquis was restarted after the procedure  The patient did not have much of the blood loss, so we started the Eliquis after 1 day of TMA. The patient tolerated the procedure well and postoperatively she was placed on wound VAC. Today wound VAC was removed and Dr. Alberteen Spindleline said that she can go back to rehabilitation and follow with him in a week, and patient should have no weight on the left foot. Xeroform and dry dressing were supplied for the left foot. The patient is given prophylactic antibiotics to prevent infection. 2.  Type 2 diabetes mellitus. She is on metformin. She did not get metformin after the procedure because of the contrast, but now she will go back on metformin. 3.  Chronic atrial fibrillation. She is on Eliquis and metoprolol and digoxin. Rate is controlled. 4.  Thrombocytopenia. Platelets of 72,000. We do not have previous records. She is from  [place], though I told the daughter that she needs to follow up with her primary doctor to keep an eye on her platelet count and also monitor periodically with CBC.  DISCHARGE VITAL SIGNS: Temperature is 97.6, heart rate 83, blood pressure is 113/53, saturations 100% on room air.  CONDITION: The patient is stable to discharge.  DISPOSITION: To [facility]  TIME SPENT: More than 30 minutes.    ____________________________ Katha Hamming,  MD sk:ST D: 11/18/2014 14:26:59 ET T: 11/18/2014 15:00:57 ET JOB#: [id number]  cc: Katha Hamming, MD, <Dictator> Katha Hamming MD ELECTRONICALLY SIGNED 12/02/2014 13:21

## 2015-01-26 NOTE — Consult Note (Signed)
PATIENT NAME:  [name], [name] MR#:  [id number] DATE OF BIRTH:  July 07, 1921  DATE OF CONSULTATION:  01/03/2015  REFERRING PHYSICIAN:  Renford Dills, MD  CONSULTING PHYSICIAN:  Jannessa Ogden P. Juliene Pina, MD  PRIMARY CARE PHYSICIAN: She would like it to be Dr. Terance Hart; she is a resident at [facility].    REASON FOR REQUEST: Medical management.   IMPRESSION:  1.  Left foot gangrene status post left below-knee amputation POD #0.  2.  Type 2 diabetes.  3.  Peripheral vascular disease.  4.  Chronic atrial fibrillation.  5.  History of essential hypertension  PLAN:  1.  Postoperative wound care and antibiotics as per surgery.  2.  As for her atrial fibrillation, would recommend telemetry monitoring as the patient is in atrial fibrillation, continue digoxin, patient does take Eliquis as an outpatient but currently on Lovenox. .  Her rate does seem to be controlled, would continue metoprolol, especially in the post op setting.  3.  Would continue quinapril and metoprolol for her blood pressure.  4.  For her diabetes, would continue sliding scale insulin, monitoring blood sugars, ADA diet.   HISTORY OF PRESENT ILLNESS: This is a 81 year old female who presented for an outpatient procedure. She underwent a left below the knee amputation for left forefoot gangrene, hospitalist service was consulted for medical management.   REVIEW OF SYSTEMS:  CONSTITUTIONAL: Positive for weakness. No fever or chills, positive fatigue.  EYES:  She wears glasses. No blurred vision.  EARS AND NOSE AND THROAT: Decreased hearing, no sore throat, no difficulty swallowing.  CARDIOVASCULAR: No chest pain, palpitations, syncope.  RESPIRATORY: No cough, no sputum, no hemoptysis, no shortness of breath.  GASTROINTESTINAL: No nausea, vomiting, diarrhea, abdominal pain, melena, or ulcers.  GENITOURINARY: No dysuria or hematuria.  ENDOCRINE: No polyuria or polydipsia.  LYMPHATIC: Positive anemia of chronic disease.  SKIN: She  did have gangrene of her left foot, now she has a BKA.  NEUROLOGIC:  No history of CVA, TIAs.   PSYCHIATRIC:  No history of anxiety or depression.  ENDOCRINE: No thyroid problems.   PAST MEDICAL HISTORY:  The patient has:  1.  Chronic atrial fibrillation.  2.  Diabetes.  3.  History of essential hypertension.   PAST SURGICAL HISTORY: She is postoperative day zero for a left BKA.   SOCIAL HISTORY: The patient quit smoking over 50 years ago, no alcohol or IV drug use; she is a resident at [facility].   FAMILY HISTORY: Positive for TB.   ALLERGIES: KEFLEX AND TORADOL.   PHYSICAL EXAMINATION: VITAL SIGNS: Temperature 97.4, pulse 96, respirations 17, blood pressure 158/67, saturation 100% on 3 liters.  GENERAL: The patient is alert and oriented in no acute distress.  HEENT: Head is atraumatic, pupils are round and reactive, sclerae anicteric; mucous membranes are moist.  OROPHARYNX: Clear.  NECK: Supple without JVD, carotid bruit, or enlarged thyroid.  CARDIOVASCULAR: Irregularly irregular, tachycardic with a 3/6 systolic ejection murmur.  LUNGS: Clear to auscultation without crackles, rales, rhonchi or wheezing. No use of accessory muscles.  ABDOMEN: Bowel sounds positive, nontender, distended, no hepatosplenomegaly.  EXTREMITIES: She has a left BKA dressed with an Ace bandage.  NEUROLOGIC: Cranial nerves II through XII are grossly intact, there are no focal deficits.  PSYCHIATRIC: The patient is alert, oriented, not depressed affect.   LABORATORY DATA: Creatinine is 0.53.   Thank you for allowing [place] to participate in the care of the patient, we will continue to follow.   TIME SPENT  ON THIS CONSULT:   45 minutes.      ____________________________ Janyth ContesSital P. Juliene PinaMody, MD spm:nt D: 01/03/2015 19:04:12 ET T: 01/03/2015 22:15:22 ET JOB#: [id number]  cc: Dajuana Palen P. Juliene PinaMody, MD, <Dictator> Janyth ContesSITAL P Keyasia Jolliff MD ELECTRONICALLY SIGNED 01/06/2015 12:46

## 2015-01-26 NOTE — Consult Note (Signed)
General Aspect gangrene of the left forefoot   Present Illness The patient is a 81 year old female who recently came from [place] to live with family and has not yet established medical care. She states the toes of her left foot had started turning black 2 weeks ago. She notes pain only with motion.  She had a fall about last month and had pneumonia at that time. In the ER, she was found to have 4 toes that were starting to demarcate.  She denies previous diagnosis of PAD and has never had any vascular therapies or interventions.  She denies trauma.  No fever or chills.  The patient also found  to be in rapid atrial fibrillation. The patient takes Coumadin.   PAST MEDICAL HISTORY: Atrial fibrillation, diabetes, foot deformity on the left foot,   PAST SURGICAL HISTORY:  None.   Home Medications: Medication Instructions Status  Vitamin D3 2000 intl units oral tablet 1 tab(s) orally once a day Active  digoxin 125 mcg (0.125 mg) oral tablet 1 tab(s) orally once a day Active  furosemide 40 mg oral tablet 1 tab(s) orally once a day Active  metFORMIN 1000 mg oral tablet 1 tab(s) orally 2 times a day (with meals) Active  quinapril 10 mg oral tablet 1 tab(s) orally once a day Active  warfarin 5 mg oral tablet 1 tab(s) orally once a day Active    Keflex: Rash  Toradol: Other  Tramadol: Unknown  Prednisolone: Unknown  Amoxicillin: Unknown  Case History:  Family History Non-Contributory   Social History negative tobacco, negative ETOH, negative Illicit drugs   Review of Systems:  ROS No TIA/stroke/seizure No heat or cold intolerance No dysuria/hematuria No blurry or double vision No tinnitus or ear pain No rashes or ulcer No suicidal ideation or psychosis No signs of bleeding or easy bruising No SOB/DOE, orthopnea, or sputum No palpitations or chest pain No N/V/D or abdominal pain No joint pain or joint swelling No fever or chills No unintentional weight loss or gain   Physical  Exam:  GEN well developed, well nourished, no acute distress   HEENT hearing intact to voice, moist oral mucosa   NECK supple  trachea midline   RESP normal resp effort  no use of accessory muscles   CARD regular rate  no JVD   ABD denies tenderness  soft   EXTR negative cyanosis/clubbing, positive edema, pedal pulses are not palpable, moderate charcot foot deformity   SKIN No rashes, positive ulcers, gangrene of the left 1-4 toes   NEURO cranial nerves intact, follows commands, motor/sensory function intact   PSYCH alert, A+O to time, place, person   Nursing/Ancillary Notes: **Vital Signs.:   29-Jan-16 18:00  Vital Signs Type Admission  Temperature Temperature (F) 97.8  Celsius 36.5  Temperature Source oral  Pulse Pulse 117  Respirations Respirations 20  Systolic BP Systolic BP 211  Diastolic BP (mmHg) Diastolic BP (mmHg) 71  Mean BP 91  Pulse Ox % Pulse Ox % 93  Pulse Ox Activity Level  At rest  Oxygen Delivery 2L   Hepatic:  29-Jan-16 12:25   Bilirubin, Total 0.6  Alkaline Phosphatase 73  SGPT (ALT) 25  SGOT (AST) 33  Total Protein, Serum 6.9  Albumin, Serum  2.8  TDMs:  29-Jan-16 12:25   Digoxin, Serum 0.61 (Therapeutic range for digoxin in patients with atrial fibrillation: 0.8 - 2.0 ng/mL. In patients with congestive heart failure a therapeutic range of 0.5 - 0.8 ng/mL is suggested as  higher levels are associated with an increased risk of toxicity without clear evidence of enhanced efficacy. Digoxin toxicity is commonly associated with serum levels > 2.0 ng/mL but may occur with lower levels, including those in the therapeutic range. Blood samples should be obtained 6-8 hours after administration to assure a reasonable volume of distribution.)  Routine Chem:  29-Jan-16 12:25   Result Comment HGB/HCT - RESULTS VERIFIED BY REPEAT TESTING.  Result(s) reported on 25 Oct 2014 at 01:41PM.  Glucose, Serum  116  BUN  23  Creatinine (comp) 0.97  Sodium,  Serum 140  Potassium, Serum 3.8  Chloride, Serum 103  CO2, Serum 32  Calcium (Total), Serum 9.3  Anion Gap  5  Osmolality (calc) 284  eGFR (African American) >60  eGFR (Non-African American)  57 (eGFR values <47mL/min/1.73 m2 may be an indication of chronic kidney disease (CKD). Calculated eGFR, using the MRDR Study equation, is useful in  patients with stable renal function. The eGFR calculation will not be reliable in acutely ill patients when serum creatinine is changing rapidly. It is not useful in patients on dialysis. The eGFR calculation may not be applicable to patients at the low and high extremes of body sizes, pregnant women, and vegetarians.)  Routine Coag:  29-Jan-16 12:25   Prothrombin  16.3  INR 1.3 (INR reference interval applies to patients on anticoagulant therapy. A single INR therapeutic range for coumarins is not optimal for all indications; however, the suggested range for most indications is 2.0 - 3.0. Exceptions to the INR Reference Range may include: Prosthetic heart valves, acute myocardial infarction, prevention of myocardial infarction, and combinations of aspirin and anticoagulant. The need for a higher or lower target INR must be assessed individually. Reference: The Pharmacology and Management of the Vitamin K  antagonists: the seventh ACCP Conference on Antithrombotic and Thrombolytic Therapy. OVFIE.3329 Sept:126 (3suppl): [id number]. A HCT value >55% may artifactually increase the PT.  In one study,  the increase was an average of 25%. Reference:  "Effect on Routine and Special Coagulation Testing Values of Citrate Anticoagulant Adjustment in Patients with High HCT Values." American Journal of Clinical Pathology 2006;126:400-405.)    18:16   Activated PTT (APTT)  36.0 (A HCT value >55% may artifactually increase the APTT. In one study, the increase was an average of 19%. Reference: "Effect on Routine and Special Coagulation Testing Values of  Citrate Anticoagulant Adjustment in Patients with High HCT Values." American Journal of Clinical Pathology 2006;126:400-405.)  Routine Hem:  29-Jan-16 12:25   WBC (CBC)  11.4  RBC (CBC) 4.09  Hemoglobin (CBC)  8.6  Hematocrit (CBC)  29.0  Platelet Count (CBC)  457  MCV  71  MCH  20.9  MCHC  29.5  RDW  22.4  Neutrophil % 72.6  Lymphocyte % 18.0  Monocyte % 7.8  Eosinophil % 0.9  Basophil % 0.7  Neutrophil #  8.3  Lymphocyte # 2.1  Monocyte # 0.9  Eosinophil # 0.1  Basophil # 0.1   XRay:    29-Jan-16 13:59, Foot Left Complete  Foot Left Complete   REASON FOR EXAM:    painful, red/purple toes  COMMENTS:       PROCEDURE: DXR - DXR FOOT LT COMP W/OBLIQUES  - Oct 25 2014  1:59PM     CLINICAL DATA:  Pain and swelling.    EXAM:  LEFT FOOT - COMPLETE 3+ VIEW    COMPARISON:  None.    FINDINGS:  Severeand diffuse calcifications involving the arteries of  the  ankle and foot. There is also diffuse soft tissue calcifications  which could be due to dermatomyositis. No destructive bone changes  to suggest osteomyelitis. I do not see any obvious gas in the soft  tissues. Moderate degenerative changes and osteoporosis.     IMPRESSION:  No definite plain film findings for osteomyelitis.    Extensive vascular calcifications.      Electronically Signed    By: Kalman Jewels M.D.    On: 10/25/2014 14:27      Verified By: Marlane Hatcher, M.D.,    Impression 1. 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  The patient has nonpalpable pedal pulses and tissue loss.  Her left limb is in jeopardy and she is at risk for limb loss.  I will plan for angiography and intervention for limb salvage.  Her INR is subtherapeutic and if needed I would advocate for heparin gtt for now. 2. Cellulitis associated with the gangrene left leg  continue antibiotics and as long as the infection is improving I would plan for vascular intervention when cellulitis treated. 3.  Rapid atrial fibrillation. The patient does take digoxin. I will check a digoxin level, give a stat dose of metoprolol orally.  The patient is on Coumadin. I will check an INR. Will admit to telemetry. 4. Anemia. I will check iron studies, likely anemia of chronic disease.  5. Diabetes. Will put on sliding scale and hold metformin at this time.  6.  Hypertension. continue oral meds   Plan level 4 consult   Electronic Signatures: Hortencia Pilar (MD)  (Signed 30-Jan-16 16:15)  Authored: General Aspect/Present Illness, Home Medications, Allergies, History and Physical Exam, Vital Signs, Labs, Radiology, Impression/Plan   Last Updated: 30-Jan-16 16:15 by Hortencia Pilar (MD)

## 2015-01-26 NOTE — Discharge Summary (Signed)
PATIENT NAME:  [name], PROBY MR#:  [id number] DATE OF BIRTH:  09-10-1921  DATE OF ADMISSION:  10/25/2014 DATE OF DISCHARGE:    ADMITTING PHYSICIAN:  Dr. Alford Highland.   DISCHARGING PHYSICIAN: Dr. Enid Baas.   PRIMARY CARE PHYSICIAN: Currently none.   CONSULTATIONS IN THE HOSPITAL:  1.  Vascular consultation by Dr. Gilda Crease.   2.  Cardiology consultation by Dr. Juliann Pares.    DISCHARGE DIAGNOSES:   1. Acute respiratory failure.  2. Acute on chronic diastolic congestive heart failure exacerbation.  3. Acute bronchitis.  4. Hypertension.  5. Diabetes mellitus.  6. Peripheral arterial disease with left foot gangrene and necrotic toes.  7. Atrial fibrillation.  DISCHARGE HOME MEDICATIONS:  1. Vitamin D3, 2000 international units p.o. daily.  2. Digoxin 125 mcg p.o. daily.  3. Lasix 40 mg p.o. daily.  4. Metformin 1000 mg p.o. b.i.d.  5. Quinapril 10 mg p.o. daily.  6. Oxycodone 5 mg q. 6 hours p.r.n. for moderate pain.  7. Eliquis 5 mg p.o. b.i.d.  8. Metoprolol 100 mg p.o. b.i.d.  9. Potassium chloride 20 mEq 1 tablet p.o. daily.  10. Colace 100 mg p.o. b.i.d. p.r.n. for constipation.  11. Prednisone 10 mg p.o. daily for 1 more day and stop.  12. Doxycycline 100 mg p.o. b.i.d. for 10 days.  13. Ciprofloxacin 500 mg p.o. b.i.d. for 10 days.  14. Albuterol ipratropium CFC free/Combivent Respimat 1 puff 4 times a day.   DISCHARGE DIET: Low-sodium diet.   DISCHARGE OXYGEN: 2 liters.   DISCHARGE ACTIVITY: As tolerated.    FOLLOWUP INSTRUCTIONS:  1. PCP followup in 1-2 weeks.  2. Follow up with Dr. Gilda Crease in 10 days.  3. Cardiology followup in 2-3 weeks.  4. Physical therapy.   LABORATORIES AND IMAGING STUDIES PRIOR TO DISCHARGE:  1.  Sodium 142, potassium 3.6, chloride 101, bicarbonate 38, BUN 20, creatinine 0.70, glucose 83, and calcium of 8.7.  2.  WBC 14.1, hemoglobin 9.5, hematocrit 32.1, and platelet count 502,000.  3.  Echocardiogram Doppler showing LV  ejection fraction is 55-60%, RV volume overload, moderately dilated left atrium, and mild to moderate mitral valve regurgitation noted.  4.  Chest x-ray on 10/30/2014 showing persistent cardiomegaly, however decreased bilateral pleural effusions, larger on right greater than left, and pulmonary vascular prominence is improved than before.  5.  Left foot x-ray showing extensive vascular calcifications, no evidence of any osteomyelitis noted.   BRIEF HOSPITAL COURSE: Miss Dollinger is a 81 year old elderly Caucasian female with past medical history significant for atrial fibrillation noncompliant with Coumadin, diabetes, hypertension, who presented to the hospital secondary to gangrenous left foot toes.   1.  Peripheral arterial disease with left foot gangrene, dry gangrene of the toes, going on for almost a month. The patient was living in [place] and moved over to [place] to live with her son, so says like she has waited long before she got medical attention. She was seen by Dr. Gilda Crease and the patient had angiogram of the left leg which showed significant arthrosclerotic occlusive disease bilateral lower extremities. She had atherectomy of the popliteal artery on the left side, however the peroneal artery crossing was unsuccessful. So Dr. Gilda Crease recommended a repeat angiogram procedure to help revascularize the leg again in 1-2 weeks. Until then the patient will be discharged on antibiotics, doxycycline and ciprofloxacin.  She does not have any active infection, this is more like a prophylactic antibiotic at this time.  2.  Acute respiratory failure secondary  to acute on chronic diastolic congestive heart failure exacerbation, improved with restarting of her Lasix.  She will need home oxygen 2 liters at this time and wean as tolerated. Echo showing EF of 55%.  3.  Atrial fibrillation, noncompliant with Coumadin in the past, restarting on Eliquis at this time during the hospital course. EF is 55%.  She is on digoxin and she is on metoprolol for rate control. Outpatient cardiology followup recommended.  4.  Acute bronchitis, finishing a prednisone taper, on antibiotics and being discharged on an inhaler.  5.  Hypertension. Home medications are continued.  6.  Diabetes mellitus on metformin which was held for her angiogram.  7.  Her course has been otherwise uneventful in the hospital.   DISCHARGE CONDITION: Stable.   DISCHARGE DISPOSITION: To short term rehabilitation as recommended by physical therapy.   CODE STATUS: Do Not Resuscitate.   TIME SPENT ON DISCHARGE: 45 minutes.    ____________________________ Enid Baasadhika Jestina Stephani, MD rk:[id number] D: 10/31/2014 13:56:53 ET T: 10/31/2014 14:27:56 ET JOB#: [id number]  cc: Enid Baasadhika Irean Kendricks, MD, <Dictator> Enid BaasADHIKA Layne Dilauro MD ELECTRONICALLY SIGNED 11/04/2014 17:00

## 2015-01-26 NOTE — Consult Note (Signed)
PATIENT NAME:  [name], SIVERTSON MR#:  [id number] DATE OF BIRTH:  1921/07/05  DATE OF CONSULTATION:  11/12/2014  REFERRING PHYSICIAN:  Dr. Gilda Crease - Vascular CONSULTING PHYSICIAN:  Hope Pigeon. Elisabeth Pigeon, MD  REASON FOR CONSULTATION: Medical management.   HISTORY OF PRESENTING ILLNESS: This is a 81 year old female with past medical history of atrial fibrillation, diabetes, foot deformity and foot gangrene on the toes who was admitted recently, 2 weeks ago, with complaint of toes turning black and has been following with vascular since then. History obtained from the patient's son who is present in the room. The patient was living in [place] until 2 weeks ago and moved to live with her son here and was taken to hospital the next day because of generalized weakness and some infection on the foot, on the toes. She was given some antibiotic, treated for that, and sent to [facility] for rehab, and from there today she was scheduled to have stenting procedure for peripheral vascular disease. She came for that. Vascular doctor put some stents in her peripheral arteries, in the lower limb, but he also suggested to admit her as she might need to go for surgery of her gangrene on the toes and so they are admitting the patient and called medical consult for further management. Son denies any other complaint at this time.   REVIEW OF SYSTEMS: Unable to get it as the patient is still coming out of effect of the medications for her procedure.   PAST MEDICAL HISTORY:  1.  Atrial fibrillation.  2.  Diabetes.  3.  Foot deformity.  4.  Left foot gangrene on the toes.   PAST SURGICAL HISTORY: None.   SOCIAL HISTORY: She quit smoking 50 years ago. No alcohol. No drug use. Lives with son.   FAMILY HISTORY: Lives with son but recently sent to [facility] after admission 2 weeks ago in the hospital.   FAMILY HISTORY: Father committed suicide, had tuberculosis. Mother has unknown medical history.   HOME  MEDICATIONS: 1.  Vitamin D3 2,000 international units once a day.  2.  Quinapril 10 mg oral tablet once a day.  3.  Potassium chloride 20 mEq once a day.  4.  Oxycodone 10 mg oral every 12 hours.  5.  Oxycodone 5 mg every 6 hours as needed for pain.  6.  Metoprolol 100 mg 2 times a day.  7.  Metformin 1000 mg oral 2 times a day.  8.  Furosemide 40 mg oral tablet once a day. 9.  Docusate sodium 100 mg 2 times a day.  10.  Digoxin 125 mcg oral once a day.  11.  Combivent 1 puff inhalation 4 times a day.  12.  Apixaban 5 mg oral tablet 2 times a day   PHYSICAL EXAMINATION: VITAL SIGNS: Temperature 98.1, pulse 115, respirations 20, blood pressure 149/72, and pulse ox 100% on 2 liters oxygen supplementation.  GENERAL: The patient is slightly drowsy but easily arousable.  HEENT: Head and neck atraumatic. Conjunctivae are pink. Oral mucosa moist.  NECK: Supple. No JVD. Thyroid nontender.  RESPIRATORY: Bilateral equal and clear air entry. No crepitation or wheezing.  CARDIOVASCULAR: S1, S2 present. Irregular. Some tachycardia present. No tenderness on local palpation.  ABDOMEN: Soft, nontender. Bowel sounds present. No organomegaly. No distention.  SKIN: No acne, rashes, or lesions.  EXTREMITIES: On left lower extremity, on the toes, there is dry gangrene present on all the toes. There are no signs of cellulitis or infection.  LEGS:  No edema.  NEUROLOGIC: Power 3 to 4/5. She moves the limbs but very weak overall.  PSYCHIATRIC: Unable to check it as she is still under light effect of medications.  DIAGNOSTIC DATA: Glucose 92, BUN 39, creatinine 0.99, sodium 142, potassium 4.6, chloride 107, calcium 9.4, CO2 29. Next BUN is 41.   ASSESSMENT AND PLAN: A 81 year old female who came for vascular procedure and being admitted for further podiatric consult and possible amputation of the toes because of gangrene. Medical consult for medical management.  1.  Peripheral vascular disease. This is being  managed by vascular and she might need amputation of her gangrenous toes. Podiatry has been consulted for that and she can proceed with surgery after having clearance from cardiology team. We will continue following for medical issues meanwhile. Currently, there are no signs of infection so she does not need any antibiotic at this point.  2.  Atrial fibrillation. She has slight tachycardia. I will continue her metoprolol what she was taking and digoxin. She was discharged on Eliquis from last admission from hospital. Currently she is on heparin IV drip, so we will hold her anticoagulation orally and further decision about anticoagulation at the time of discharge by cardiology.  3.  Diabetes. We will hold metformin at this time as she might go for surgery. We will give insulin sliding scale coverage.  4.  Hypertension. We will continue metoprolol and digoxin at this time and quinapril and monitor.  We will continue following for medical issues. The patient needs to have cardiology clearance before proceeding for surgery.   TOTAL TIME SPENT ON THIS CONSULTATION: 45 minutes.  ____________________________ Hope PigeonVaibhavkumar G. Elisabeth PigeonVachhani, MD [id number] D: 11/12/2014 14:43:20 ET T: 11/12/2014 15:19:30 ET JOB#: [id number]  cc: Hope PigeonVaibhavkumar G. Elisabeth PigeonVachhani, MD, <Dictator> Altamese DillingVAIBHAVKUMAR Jadon Harbaugh MD ELECTRONICALLY SIGNED 11/13/2014 16:14

## 2015-02-11 ENCOUNTER — Encounter: Payer: Medicare Other | Attending: Surgery | Admitting: Surgery

## 2015-02-11 ENCOUNTER — Ambulatory Visit
Admission: RE | Admit: 2015-02-11 | Discharge: 2015-02-11 | Disposition: A | Discharge status: Home / Self Care | Payer: Medicare Other | Source: Ambulatory Visit | Attending: Surgery | Admitting: Surgery

## 2015-02-11 ENCOUNTER — Other Ambulatory Visit: Payer: Self-pay | Admitting: Surgery

## 2015-02-11 DIAGNOSIS — I739 Peripheral vascular disease, unspecified: Secondary | ICD-10-CM | POA: Insufficient documentation

## 2015-02-11 DIAGNOSIS — Z89512 Acquired absence of left leg below knee: Secondary | ICD-10-CM | POA: Insufficient documentation

## 2015-02-11 DIAGNOSIS — L97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: Secondary | ICD-10-CM | POA: Diagnosis not present

## 2015-02-11 DIAGNOSIS — L97812 Non-pressure chronic ulcer of other part of right lower leg with fat layer exposed: Secondary | ICD-10-CM | POA: Diagnosis present

## 2015-02-11 DIAGNOSIS — Z87891 Personal history of nicotine dependence: Secondary | ICD-10-CM | POA: Diagnosis not present

## 2015-02-11 DIAGNOSIS — I70235 Atherosclerosis of native arteries of right leg with ulceration of other part of foot: Secondary | ICD-10-CM | POA: Insufficient documentation

## 2015-02-11 DIAGNOSIS — M19071 Primary osteoarthritis, right ankle and foot: Secondary | ICD-10-CM | POA: Insufficient documentation

## 2015-02-11 DIAGNOSIS — E11621 Type 2 diabetes mellitus with foot ulcer: Secondary | ICD-10-CM | POA: Diagnosis not present

## 2015-02-11 DIAGNOSIS — L97519 Non-pressure chronic ulcer of other part of right foot with unspecified severity: Secondary | ICD-10-CM | POA: Diagnosis not present

## 2015-02-11 DIAGNOSIS — L98499 Non-pressure chronic ulcer of skin of other sites with unspecified severity: Secondary | ICD-10-CM

## 2015-02-11 NOTE — Progress Notes (Signed)
[name], [name] ([id number]) Visit Report for 02/11/2015 Abuse/Suicide Risk Screen Details Patient Name: [name], [name] Date of Service: 02/11/2015 11:00 AM Medical Record Number: [id number] Patient Account Number: [id number] Date of Birth/Sex: 09/23/1921 (81 y.o. Female) Treating RN: Curtis Sitesorthy, Joanna Primary Care Physician: Dorothey BasemanBRONSTEIN, DAVID Other Clinician: Referring Physician: Treating Physician/Extender: Rudene ReBritto, Errol Weeks in Treatment: 0 Abuse/Suicide Risk Screen Items Answer ABUSE/SUICIDE RISK SCREEN: Has anyone close to you tried to hurt or harm you recentlyo No Do you feel uncomfortable with anyone in your familyo No Has anyone forced you do things that you didnot want to doo No Do you have any thoughts of harming yourselfo No Patient displays signs or symptoms of abuse and/or neglect. No Electronic Signature(s) Signed: 02/11/2015 12:06:12 PM By: Curtis Sitesorthy, Joanna Entered By: Curtis Sitesorthy, Joanna on 02/11/2015 12:06:11 [name], [name] ([id number]) -------------------------------------------------------------------------------- Activities of Daily Living Details Patient Name: [name], [name] Date of Service: 02/11/2015 11:00 AM Medical Record Number: [id number] Patient Account Number: [id number] Date of Birth/Sex: 01/25/1921 (81 y.o. Female) Treating RN: Curtis Sitesorthy, Joanna Primary Care Physician: Dorothey BasemanBRONSTEIN, DAVID Other Clinician: Referring Physician: Treating Physician/Extender: Rudene ReBritto, Errol Weeks in Treatment: 0 Activities of Daily Living Items Answer Activities of Daily Living (Please select one for each item) Drive Automobile Not Able Take Medications Completely Able Use Telephone Completely Able Care for Appearance Completely Able Use Toilet Completely Able Bath / Shower Need Assistance Dress Self Need Assistance Feed Self Completely Able Walk Need Assistance Get In / Out Bed Need Assistance Housework Need Assistance Prepare Meals Completely Able Handle Money Completely  Able Shop for Self Need Assistance Electronic Signature(s) Signed: 02/11/2015 12:06:45 PM By: Curtis Sitesorthy, Joanna Entered By: Curtis Sitesorthy, Joanna on 02/11/2015 12:06:45 [name], [name] ([id number]) -------------------------------------------------------------------------------- Education Assessment Details Patient Name: [name], [name] Date of Service: 02/11/2015 11:00 AM Medical Record Number: [id number] Patient Account Number: [id number] Date of Birth/Sex: 07/06/1921 (81 y.o. Female) Treating RN: Curtis Sitesorthy, Joanna Primary Care Physician: Dorothey BasemanBRONSTEIN, DAVID Other Clinician: Referring Physician: Treating Physician/Extender: Rudene ReBritto, Errol Weeks in Treatment: 0 Primary Learner Assessed: Patient Learning Preferences/Education Level/Primary Language Learning Preference: Explanation, Demonstration, Printed Material Highest Education Level: High School Preferred Language: English Cognitive Barrier Assessment/Beliefs Language Barrier: No Translator Needed: No Memory Deficit: No Emotional Barrier: No Cultural/Religious Beliefs Affecting Medical No Care: Physical Barrier Assessment Impaired Vision: No Impaired Hearing: No Decreased Hand dexterity: No Knowledge/Comprehension Assessment Knowledge Level: Medium Comprehension Level: Medium Ability to understand written Medium instructions: Ability to understand verbal Medium instructions: Motivation Assessment Anxiety Level: Anxious Cooperation: Cooperative Education Importance: Acknowledges Need Interest in Health Problems: Asks Questions Perception: Coherent Willingness to Engage in Self- Medium Management Activities: Readiness to Engage in Self- Medium Management Activities: Electronic Signature(s) JardineSILETZKY, [name] (([id number]) Signed: 02/11/2015 12:07:15 PM By: Curtis Sitesorthy, Joanna Entered By: Curtis Sitesorthy, Joanna on 02/11/2015 12:07:15 [name], [name] ([id number]) -------------------------------------------------------------------------------- Fall  Risk Assessment Details Patient Name: [name], [name] Date of Service: 02/11/2015 11:00 AM Medical Record Number: [id number] Patient Account Number: [id number] Date of Birth/Sex: 06/16/1921 (81 y.o. Female) Treating RN: Curtis Sitesorthy, Joanna Primary Care Physician: Dorothey BasemanBRONSTEIN, DAVID Other Clinician: Referring Physician: Treating Physician/Extender: Rudene ReBritto, Errol Weeks in Treatment: 0 Fall Risk Assessment Items FALL RISK ASSESSMENT: History of falling - immediate or within 3 months 0 No Secondary diagnosis 0 No Ambulatory aid None/bed rest/wheelchair/nurse 0 Yes Crutches/cane/walker 0 No Furniture 0 No IV Access/Saline Lock 0 No Gait/Training Normal/bed rest/immobile 0 Yes Weak 0 No Impaired 20 Yes Mental Status Oriented to own ability 0 Yes Electronic Signature(s) Signed: 02/11/2015 12:07:41 PM By: Curtis Sitesorthy, Joanna Entered By: Curtis Sitesorthy, Joanna on 02/11/2015 12:07:41 [name], [name] ([id number]) --------------------------------------------------------------------------------  Foot Assessment Details Patient Name: [name], [name] Date of Service: 02/11/2015 11:00 AM Medical Record Number: [id number] Patient Account Number: [id number] Date of Birth/Sex: 08/13/1921 (81 y.o. Female) Treating RN: Curtis Sitesorthy, Joanna Primary Care Physician: Terance HartBRONSTEIN, DAVID Other Clinician: Referring Physician: Treating Physician/Extender: Rudene ReBritto, Errol Weeks in Treatment: 0 Foot Assessment Items Site Locations + = Sensation present, - = Sensation absent, C = Callus, U = Ulcer R = Redness, W = Warmth, M = Maceration, PU = Pre-ulcerative lesion F = Fissure, S = Swelling, D = Dryness Assessment Right: Left: Other Deformity: No No Prior Foot Ulcer: No No Prior Amputation: No Yes Charcot Joint: No No Ambulatory Status: Non-ambulatory Assistance Device: Wheelchair Gait: Surveyor, miningUnsteady Electronic Signature(s) Signed: 02/11/2015 12:08:34 PM By: Curtis Sitesorthy, Joanna Entered By: Curtis Sitesorthy, Joanna on 02/11/2015 12:08:34 [name],  Nalani ([id number]) -------------------------------------------------------------------------------- Nutrition Risk Assessment Details Patient Name: [name], Vianey Date of Service: 02/11/2015 11:00 AM Medical Record Number: [id number] Patient Account Number: [id number] Date of Birth/Sex: 05/17/1921 (81 y.o. Female) Treating RN: Curtis Sitesorthy, Joanna Primary Care Physician: Terance HartBRONSTEIN, DAVID Other Clinician: Referring Physician: Treating Physician/Extender: Rudene ReBritto, Errol Weeks in Treatment: 0 Height (in): 62 Weight (lbs): 155 Body Mass Index (BMI): 28.3 Nutrition Risk Assessment Items NUTRITION RISK SCREEN: I have an illness or condition that made me change the kind and/or 0 No amount of food I eat I eat fewer than two meals per day 0 No I eat few fruits and vegetables, or milk products 0 No I have three or more drinks of beer, liquor or wine almost every day 0 No I have tooth or mouth problems that make it hard for me to eat 0 No I don't always have enough money to buy the food I need 0 No I eat alone most of the time 0 No I take three or more different prescribed or over-the-counter drugs a 1 Yes day Without wanting to, I have lost or gained 10 pounds in the last six 0 No months I am not always physically able to shop, cook and/or feed myself 2 Yes Nutrition Protocols Good Risk Protocol Moderate Risk Protocol Electronic Signature(s) Signed: 02/11/2015 12:07:55 PM By: Curtis Sitesorthy, Joanna Entered By: Curtis Sitesorthy, Joanna on 02/11/2015 12:07:55

## 2015-02-12 NOTE — Progress Notes (Signed)
[name] ([id number]) Visit Report for 02/11/2015 Chief Complaint Document Details Patient Name: [name], [name] Date of Service: 02/11/2015 11:00 AM Medical Record Number: [id number] Patient Account Number: [id number] Date of Birth/Sex: Jan 14, 1921 (81 y.o. Female) Treating RN: Primary Care Physician: Terance Hart, DAVID Other Clinician: Referring Physician: Treating Physician/Extender: Rudene Re in Treatment: 0 Information Obtained from: Patient Chief Complaint Patient presents to the wound care center for a consult due non healing wound. 81 year old patient with comes with a history of a ulcerated area to the right third toe and the right heel which she's had for about 2 months. Electronic Signature(s) Signed: 02/11/2015 12:54:03 PM By: Evlyn Kanner MD, FACS Entered By: Evlyn Kanner on 02/11/2015 12:37:33 [name] ([id number]) -------------------------------------------------------------------------------- Debridement Details Patient Name: [name] Date of Service: 02/11/2015 11:00 AM Medical Record Number: [id number] Patient Account Number: [id number] Date of Birth/Sex: [birth year] (81 y.o. Female) Treating RN: Primary Care Physician: Terance Hart, DAVID Other Clinician: Referring Physician: Treating Physician/Extender: Rudene Re in Treatment: 0 Debridement Performed for Wound #1 Right Calcaneous Assessment: Performed By: Physician Tristan Schroeder., MD Debridement: Debridement Pre-procedure Yes Verification/Time Out Taken: Start Time: 12:22 Pain Control: Lidocaine 4% Topical Solution Level: Skin/Subcutaneous Tissue Total Area Debrided (L x 2.5 (cm) x 2.8 (cm) = 7 (cm) W): Tissue and other Viable, Non-Viable, Eschar, Fibrin/Slough, Skin, Subcutaneous material debrided: Instrument: Forceps, Scissors Bleeding: Minimum Hemostasis Achieved: Pressure End Time: 12:28 Procedural Pain: 2 Post Procedural Pain: 0 Response to Treatment: Procedure  was tolerated well Post Debridement Measurements of Total Wound Length: (cm) 2.5 Width: (cm) 2.8 Depth: (cm) 0.2 Volume: (cm) 1.1 Electronic Signature(s) Signed: 02/11/2015 12:54:03 PM By: Evlyn Kanner MD, FACS Entered By: Evlyn Kanner on 02/11/2015 12:36:53 [name] ([id number]) -------------------------------------------------------------------------------- HPI Details Patient Name: [name] Date of Service: 02/11/2015 11:00 AM Medical Record Number: [id number] Patient Account Number: [id number] Date of Birth/Sex: [birth year] (81 y.o. Female) Treating RN: Primary Care Physician: Terance Hart, DAVID Other Clinician: Referring Physician: Treating Physician/Extender: Rudene Re in Treatment: 0 History of Present Illness Location: right medial heel and right third toe Quality: Patient reports experiencing a dull pain to affected area(s). Severity: Patient states wound are getting worse. Duration: Patient has had the wound for > 3 months prior to seeking treatment at the wound center Timing: Pain in wound is Intermittent (comes and goes Context: The wound appeared gradually over time Modifying Factors: Consults to this date include:vascular surgeon and several recent surgeries. Associated Signs and Symptoms: Patient reports having foul odor and drainage from the left below-knee amputation site. HPI Description: A pleasant 81 year old patient who is known to have diabetes mellitus for several years has recently gone through a series of operations with the vascular surgeon Dr. Gilda Crease. In January and February she had several surgeries on the left lower extremity with an attempt to have limb salvage for gangrenous changes of her forefoot. 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 On 01/07/2015 she also had a right lower extremity distal runoff with a angioplasty of the right anterior tibial  artery to maximize her blood flow to the foot. She recently had her staples removed and Dr. Elzie Rings office on 01/31/2015 and at that time a right lower extremity duplex was done which showed patent vessels and a patent stent with distal occluded posterior tibial artery. Though the duplex was noncritical the recommendations from the PA at the vascular surgery office was that of a angiogram to be done but the  patient said she would rather try some bone care before. The patient has received doxycycline and Cipro in the recent past and she takes oral medications for her diabetes. Other than that I reviewed her list of all her medications. No recent hemoglobin A1c has been done and no recent x-rays of the right foot have been done. Electronic Signature(s) Signed: 02/11/2015 12:54:03 PM By: Evlyn Kanner MD, FACS Entered By: Evlyn Kanner on 02/11/2015 12:42:18 [name] ([id number]) -------------------------------------------------------------------------------- Physical Exam Details Patient Name: [name] Date of Service: 02/11/2015 11:00 AM Medical Record Number: [id number] Patient Account Number: [id number] Date of Birth/Sex: 07-17-1921 (81 y.o. Female) Treating RN: Primary Care Physician: Terance Hart, DAVID Other Clinician: Referring Physician: Treating Physician/Extender: Rudene Re in Treatment: 0 Constitutional . Pulse regular. Respirations normal and unlabored. Afebrile. . Eyes Nonicteric. Reactive to light. Ears, Nose, Mouth, and Throat Lips, teeth, and gums WNL.Marland Kitchen Moist mucosa without lesions . Neck supple and nontender. No palpable supraclavicular or cervical adenopathy. Normal sized without goiter. Respiratory WNL. No retractions.. Cardiovascular no palpable pedal pulses and she's got a monophasic Doppler study. In the review of the recent angiogram and duplex study have not done a ABI.Marland Kitchen No clubbing, cyanosis or edema. Gastrointestinal (GI) Abdomen without  masses or tenderness.. No liver or spleen enlargement or tenderness.. Musculoskeletal she has a left below-knee amputation stump.Marland Kitchen the amputation stump does show some decelerations of the wound and eschar and there is some drainage from this though not purulent.. Integumentary (Hair, Skin) The left heel on the medial aspect has a lot of slough and there is some area which is white and will be sharply debrided so that medication could be applied to the rest of the wound. She also has some slough on the dorsum of the right third toe and this is fairly deep.. No crepitus or fluctuance. No peri-wound warmth or erythema. No masses.. Electronic Signature(s) Signed: 02/11/2015 12:54:03 PM By: Evlyn Kanner MD, FACS Entered By: Evlyn Kanner on 02/11/2015 12:44:43 [name] ([id number]) -------------------------------------------------------------------------------- Physician Orders Details Patient Name: [name] Date of Service: 02/11/2015 11:00 AM Medical Record Number: [id number] Patient Account Number: [id number] Date of Birth/Sex: [birth year] (81 y.o. 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 Wound #2 Right Toe Third o Clean wound with Normal Saline. Anesthetic Wound #1 Right Calcaneous o Topical Lidocaine 4% cream applied to wound bed prior to  debridement Wound #2 Right Toe Third o Topical Lidocaine 4% cream applied to wound bed prior to debridement Primary Wound Dressing Wound #1 Right Calcaneous o Santyl Ointment Wound #2 Right Toe Third o Santyl Ointment Secondary Dressing Wound #1 Right Calcaneous o Gauze and Kerlix/Conform Wound #2 Right Toe Third o Gauze and Kerlix/Conform Remick, Lari ([id number]) Dressing Change Frequency Wound #1 Right Calcaneous o Change dressing every day. Wound #2 Right Toe Third o Change dressing every day. Follow-up Appointments Wound #1 Right Calcaneous o Return Appointment in 1 week. Wound #2 Right Toe Third o Return Appointment in 1 week. Home Health  Wound #1 Right Calcaneous o Initiate Home Health for Skilled Nursing - Continue Tomasa Hosteller Continue Home Health Visits o Home Health Nurse may visit PRN to address patientos wound care needs. o FACE TO FACE ENCOUNTER: MEDICARE and MEDICAID PATIENTS: I certify that this patient is under my care and that I had a face-to-face encounter that meets the physician face-to-face encounter requirements with this patient on this date. The encounter with the patient was in whole or in part for the following MEDICAL CONDITION: (primary reason for Home Healthcare) MEDICAL NECESSITY: I certify, that based on my findings, NURSING services are a medically necessary home health service. HOME BOUND STATUS: I certify that my clinical findings support that this patient is homebound (i.e., Due to illness or injury, pt requires aid of supportive devices such as crutches, cane, wheelchairs, walkers, the use of special transportation or the assistance of another person to leave their place of residence. There is a normal inability to leave the home and doing so requires considerable and taxing effort. Other absences are for medical reasons / religious services and are infrequent or of short duration when for other reasons). o If  current dressing causes regression in wound condition, may D/C ordered dressing product/s and apply Normal Saline Moist Dressing daily until next Wound Healing Center / Other MD appointment. Notify Wound Healing Center of regression in wound condition at [phone]. o Please direct any NON-WOUND related issues/requests for orders to patient's Primary Care Physician Wound #2 Right Toe Third o Initiate Home Health for Skilled Nursing - Continue Tomasa Hosteller Continue Home Health Visits o Home Health Nurse may visit PRN to address patientos wound care needs. o FACE TO FACE ENCOUNTER: MEDICARE and MEDICAID PATIENTS: I certify that this patient is under my care and that I had a face-to-face encounter that meets the physician face-to-face encounter requirements with this patient on this date. The encounter with the patient was in whole or in part for the following MEDICAL CONDITION: (primary reason for Home Healthcare) MEDICAL NECESSITY: I certify, that based on my findings, NURSING services are a medically necessary home health service. HOME BOUND STATUS: I certify that my clinical findings support that this patient is homebound (i.e., Due to illness or injury, pt requires aid of supportive devices such as crutches, cane, wheelchairs, walkers, the use of special Cloninger, Trachelle ([id number]) transportation or the assistance of another person to leave their place of residence. There is a normal inability to leave the home and doing so requires considerable and taxing effort. Other absences are for medical reasons / religious services and are infrequent or of short duration when for other reasons). o If current dressing causes regression in wound condition, may D/C ordered dressing product/s and apply Normal Saline Moist Dressing daily until next Wound Healing Center / Other MD appointment. Notify Wound Healing Center of regression in wound condition at [phone]. o Please direct any  NON-WOUND related issues/requests for orders to patient's Primary Care Physician Medications-please add to medication list. Wound #1 Right Calcaneous o Santyl Enzymatic Ointment Wound #2 Right Toe Third o Santyl Enzymatic Ointment Radiology o X-ray, foot - R foot oooo Patient Medications Allergies: cephalexin, tramadol, amoxicillin, prednisolone Notifications Medication Indication Start End Santyl 02/11/2015 DOSE topical 250 unit/gram ointment - ointment topical as directed Notes Pt to follow up with [facility] for wound on (L) BKA Electronic Signature(s) Signed: 02/11/2015 12:35:51 PM By: Evlyn Kanner MD, FACS Entered By: Evlyn Kanner on 02/11/2015 12:35:51 [name] ([id number]) -------------------------------------------------------------------------------- Problem List Details Patient  Name: ZAIRE, [name] Date of Service: 02/11/2015 11:00 AM Medical Record Number: [id number] Patient Account Number: [id number] Date of Birth/Sex: Oct 02, 1920 (81 y.o. Female) Treating RN: Primary Care Physician: Terance Hart, DAVID Other Clinician: Referring Physician: Treating Physician/Extender: Rudene Re in Treatment: 0 Active Problems ICD-10 Encounter Code Description Active Date Diagnosis E11.621 Type 2 diabetes mellitus with foot ulcer 02/11/2015 Yes I70.235 Atherosclerosis of native arteries of right leg with 02/11/2015 Yes ulceration of other part of foot Z89.512 Acquired absence of left leg below knee 02/11/2015 Yes L97.412 Non-pressure chronic ulcer of right heel and midfoot with 02/11/2015 Yes fat layer exposed L97.812 Non-pressure chronic ulcer of other part of right lower leg 02/11/2015 Yes with fat layer exposed Inactive Problems Resolved Problems Electronic Signature(s) Signed: 02/11/2015 12:54:03 PM By: Evlyn Kanner MD, FACS Entered By: Evlyn Kanner on 02/11/2015 12:36:23 [name]  ([id number]) -------------------------------------------------------------------------------- Progress Note Details Patient Name: [name] Date of Service: 02/11/2015 11:00 AM Medical Record Number: [id number] Patient Account Number: [id number] Date of Birth/Sex: April 30, 1921 (81 y.o. Female) Treating RN: Primary Care Physician: Terance Hart, DAVID Other Clinician: Referring Physician: Treating Physician/Extender: Rudene Re in Treatment: 0 Subjective Chief Complaint Information obtained from Patient Patient presents to the wound care center for a consult due non healing wound. 81 year old patient with comes with a history of a ulcerated area to the right third toe and the right heel which she's had for about 2 months. History of Present Illness (HPI) The following HPI elements were documented for the patient's wound: Location: right medial heel and right third toe Quality: Patient reports experiencing a dull pain to affected area(s). Severity: Patient states wound are getting worse. Duration: Patient has had the wound for > 3 months prior to seeking treatment at the wound center Timing: Pain in wound is Intermittent (comes and goes Context: The wound appeared gradually over time Modifying Factors: Consults to this date include:vascular surgeon and several recent surgeries. Associated Signs and Symptoms: Patient reports having foul odor and drainage from the left below-knee amputation site. A pleasant 81 year old patient who is known to have diabetes mellitus for several years has recently gone through a series of operations with the vascular surgeon Dr. Gilda Crease. In January and February she had several surgeries on the left lower extremity with an attempt to have limb salvage for gangrenous changes of her forefoot. 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 On 01/07/2015 she also had a right lower  extremity distal runoff with a angioplasty of the right anterior tibial artery to maximize her blood flow to the foot. She recently had her staples removed and Dr. Elzie Rings office on 01/31/2015 and at that time a right lower extremity duplex was done which showed patent vessels and a patent stent with distal occluded posterior tibial artery. Though the duplex was noncritical the recommendations from the PA at the vascular surgery office was that of a angiogram to be done but the patient said she would rather try some bone care before. The patient has received doxycycline and Cipro in the recent past and she takes oral medications for her diabetes. Other than that I reviewed her list of all her medications. No recent hemoglobin A1c has been done and no recent x-rays of the right foot have been done. Wound History Patient presents with 3 open wounds that have been present for approximately 2-4 months. Patient has been treating wounds in the following manner: dry dressings. Laboratory tests have been  performed in the last month. Patient reportedly has not tested positive for an antibiotic resistant organism. Patient reportedly has not tested positive for osteomyelitis. Patient reportedly has had testing performed to evaluate circulation Gott, Astria ([id number]) in the legs. Patient History Information obtained from Patient. Allergies cephalexin, tramadol, amoxicillin, prednisolone Family History Diabetes - Child, No family history of Cancer, Heart Disease, Hereditary Spherocytosis, Hypertension, Kidney Disease, Lung Disease, Seizures, Stroke, Thyroid Problems, Tuberculosis. Social History Former smoker - 50 years ago, Alcohol Use - Never, Drug Use - No History, Caffeine Use - Moderate. Medical History Eyes Denies history of Cataracts, Glaucoma, Optic Neuritis Ear/Nose/Mouth/Throat Denies history of Chronic sinus problems/congestion, Middle ear problems Hematologic/Lymphatic Denies  history of Anemia, Hemophilia, Human Immunodeficiency Virus, Lymphedema, Sickle Cell Disease Respiratory Denies history of Aspiration, Asthma, Chronic Obstructive Pulmonary Disease (COPD), Pneumothorax, Sleep Apnea, Tuberculosis Cardiovascular Patient has history of Arrhythmia, Deep Vein Thrombosis, Peripheral Arterial Disease - RLE Denies history of Angina, Congestive Heart Failure, Coronary Artery Disease, Hypertension, Hypotension, Myocardial Infarction, Peripheral Venous Disease, Phlebitis, Vasculitis Gastrointestinal Denies history of Cirrhosis , Colitis, Crohn s, Hepatitis A, Hepatitis B, Hepatitis C Endocrine Patient has history of Type II Diabetes Denies history of Type I Diabetes Genitourinary Denies history of End Stage Renal Disease Immunological Denies history of Lupus Erythematosus, Raynaud s, Scleroderma Integumentary (Skin) Denies history of History of Burn, History of pressure wounds Musculoskeletal Denies history of Gout, Rheumatoid Arthritis, Osteoarthritis, Osteomyelitis Neurologic Denies history of Dementia, Neuropathy, Quadriplegia, Paraplegia, Seizure Disorder Oncologic Denies history of Received Chemotherapy, Received Radiation Psychiatric Denies history of Anorexia/bulimia, Confinement Anxiety [name], [name] ([id number]) Patient is treated with Oral Agents. Blood sugar is tested. Medical And Surgical History Notes Musculoskeletal L BKA Review of Systems (ROS) Constitutional Symptoms (General Health) The patient has no complaints or symptoms. Eyes Complains or has symptoms of Glasses / Contacts. Denies complaints or symptoms of Dry Eyes, Vision Changes. Ear/Nose/Mouth/Throat The patient has no complaints or symptoms. Hematologic/Lymphatic The patient has no complaints or symptoms. Respiratory The patient has no complaints or symptoms. Cardiovascular The patient has no complaints or symptoms. Gastrointestinal The patient has no complaints or  symptoms. Endocrine The patient has no complaints or symptoms. Genitourinary The patient has no complaints or symptoms. Immunological The patient has no complaints or symptoms. Integumentary (Skin) Complains or has symptoms of Wounds - LLE. Denies complaints or symptoms of Bleeding or bruising tendency, Breakdown, Swelling. Musculoskeletal The patient has no complaints or symptoms. Neurologic The patient has no complaints or symptoms. Oncologic The patient has no complaints or symptoms. Psychiatric The patient has no complaints or symptoms. Medications oxycodone 5 mg tablet oral tablet oral metformin 1,000 mg tablet oral tablet oral quinapril 10 mg tablet oral tablet oral Combivent Respimat 20 mcg-100 mcg/actuation solution for inhalation inhalation mist inhalation Lopressor 100 mg tablet oral tablet oral digoxin 125 mcg tablet oral tablet oral Agard, Alfredo ([id number]) Eliquis 5 mg tablet oral tablet oral docusate sodium 100 mg capsule oral capsule oral furosemide 40 mg tablet oral tablet oral potassium acetate 2 mEq/mL intravenous solution intravenous solution intravenous Cipro 500 mg tablet oral tablet oral doxycycline hyclate 100 mg capsule oral capsule oral Santyl 250 unit/gram topical ointment topical ointment topical as directed Vitamin D3 2,000 unit tablet oral tablet oral Objective Constitutional Pulse regular. Respirations normal and unlabored. Afebrile. Vitals Time Taken: 11:32 AM, Height: 62 in, Source: Stated, Weight: 155 lbs, Source: Stated, BMI: 28.3, Temperature: 98.4 F, Pulse: 62 bpm, Respiratory Rate: 18 breaths/min, Blood Pressure: 133/55 mmHg, Capillary Blood Glucose: 96 mg/dl.  Eyes Nonicteric. Reactive to light. Ears, Nose, Mouth, and Throat Lips, teeth, and gums WNL.Marland Kitchen. Moist mucosa without lesions . Neck supple and nontender. No palpable supraclavicular or cervical adenopathy. Normal sized without goiter. Respiratory WNL. No  retractions.. Cardiovascular no palpable pedal pulses and she's got a monophasic Doppler study. In the review of the recent angiogram and duplex study have not done a ABI.Marland Kitchen. No clubbing, cyanosis or edema. Gastrointestinal (GI) Abdomen without masses or tenderness.. No liver or spleen enlargement or tenderness.. Musculoskeletal she has a left below-knee amputation stump.Marland Kitchen. the amputation stump does show some decelerations of the wound and eschar and there is some drainage from this though not purulent.. Integumentary (Hair, Skin) The left heel on the medial aspect has a lot of slough and there is some area which is white and will be [name], [name] ([id number]) sharply debrided so that medication could be applied to the rest of the wound. She also has some slough on the dorsum of the right third toe and this is fairly deep.. No crepitus or fluctuance. No peri-wound warmth or erythema. No masses.. Wound #1 status is Open. Original cause of wound was Gradually Appeared. The wound is located on the Right Calcaneous. The wound measures 2.5cm length x 2.8cm width x 0.1cm depth; 5.498cm^2 area and 0.55cm^3 volume. The wound is limited to skin breakdown. There is no tunneling or undermining noted. There is a large amount of purulent drainage noted. The wound margin is flat and intact. There is no granulation within the wound bed. There is a large (67-100%) amount of necrotic tissue within the wound bed including Eschar and Adherent Slough. The periwound skin appearance exhibited: Erythema. The periwound skin appearance did not exhibit: Callus, Crepitus, Excoriation, Fluctuance, Friable, Induration, Localized Edema, Rash, Scarring, Dry/Scaly, Maceration, Moist, Atrophie Blanche, Cyanosis, Ecchymosis, Hemosiderin Staining, Mottled, Pallor, Rubor. The surrounding wound skin color is noted with erythema which is circumferential. Periwound temperature was noted as No Abnormality. The periwound  has tenderness on palpation. Wound #2 status is Open. Original cause of wound was Gradually Appeared. The wound is located on the Right Toe Third. The wound measures 1cm length x 1.2cm width x 0.1cm depth; 0.942cm^2 area and 0.094cm^3 volume. The wound is limited to skin breakdown. There is no tunneling or undermining noted. There is a small amount of purulent drainage noted. The wound margin is flat and intact. There is no granulation within the wound bed. There is a large (67-100%) amount of necrotic tissue within the wound bed including Eschar and Adherent Slough. The periwound skin appearance exhibited: Erythema. The periwound skin appearance did not exhibit: Callus, Crepitus, Excoriation, Fluctuance, Friable, Induration, Localized Edema, Rash, Scarring, Dry/Scaly, Maceration, Moist, Atrophie Blanche, Cyanosis, Ecchymosis, Hemosiderin Staining, Mottled, Pallor, Rubor. The surrounding wound skin color is noted with erythema which is circumferential. Periwound temperature was noted as No Abnormality. The periwound has tenderness on palpation. 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 L97.812 - Non-pressure chronic ulcer of other part of right lower leg with fat layer exposed Diagnoses ICD-10 E11.621: Type 2 diabetes mellitus with foot ulcer I70.235: Atherosclerosis of native arteries of right leg with ulceration of other part of foot Z89.512: Acquired absence of left leg below knee L97.412: Non-pressure chronic ulcer of right  heel and midfoot with fat layer exposed [name], [name] ([id number]) L97.812: Non-pressure chronic ulcer of other part of right lower leg with fat layer exposed This elderly 81 year old patient  who is a diabetic and has peripheral arterial disease on both sides has 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 She has had an angioplasty of the right lower extremity done recently and in spite of this her right heel and right third to have a fairly deep ulceration and this is a fragment to ulceration. This is a Educational psychologist 2 ulceration. she will need a x-ray of the foot to see if there is any definite osteomyelitis and if this is still doubtful may need an MRI of the right foot. She has recently been on antibiotics and hence cultures have not been taken. regarding the amputation site on the left below-knee amputation I have recommended she gets in touch with her vascular surgeon who did the amputation as he may need to see her for possible wound issues. I have recommended Santyl to both wounds and a light dressing to this and depending on what the x-rays of the foot show further plans will be made when she comes to visit [place] next week. 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 There was a Skin/Subcutaneous Tissue Debridement (40981-19147) debridement with total area of 7 sq cm performed by Deeandra Jerry, Ignacia Felling., MD. with the following instrument(s): Forceps and Scissors to remove Viable and Non- Viable tissue/material including Fibrin/Slough, Eschar, Skin, and Subcutaneous after achieving pain control using Lidocaine 4% Topical Solution. A time out was conducted prior to the start of the procedure. A Minimum amount of bleeding was controlled with Pressure. The procedure was tolerated well with a pain level of 2 throughout and a pain level of 0 following the procedure. Post Debridement Measurements: 2.5cm length x 2.8cm width x 0.2cm depth; 1.1cm^3 volume. Plan Wound Cleansing: Wound #1 Right Calcaneous: Clean wound with Normal Saline. Wound #2 Right Toe Third: Clean  wound with Normal Saline. Anesthetic: Wound #1 Right Calcaneous: Prabhleen, Montemayor Ceriah ([id number]) Topical Lidocaine 4% cream applied to wound bed prior to debridement Wound #2 Right Toe Third: Topical Lidocaine 4% cream applied to wound bed prior to debridement Primary Wound Dressing: Wound #1 Right Calcaneous: Santyl Ointment Wound #2 Right Toe Third: Santyl Ointment Secondary Dressing: Wound #1 Right Calcaneous: Gauze and Kerlix/Conform Wound #2 Right Toe Third: Gauze and Kerlix/Conform Dressing Change Frequency: Wound #1 Right Calcaneous: Change dressing every day. Wound #2 Right Toe Third: Change dressing every day. Follow-up Appointments: Wound #1 Right Calcaneous: Return Appointment in 1 week. Wound #2 Right Toe Third: Return Appointment in 1 week. Home Health: Wound #1 Right Calcaneous: Initiate Home Health for Skilled Nursing - Continue [facility] Continue Home Health Visits Home Health Nurse may visit PRN to address patient s wound care needs. FACE TO FACE ENCOUNTER: MEDICARE and MEDICAID PATIENTS: I certify that this patient is under my care and that I had a face-to-face encounter that meets the physician face-to-face encounter requirements with this patient on this date. The encounter with the patient was in whole or in part for the following MEDICAL CONDITION: (primary reason for Home Healthcare) MEDICAL NECESSITY: I certify, that based on my findings, NURSING services are a medically necessary home health service. HOME BOUND STATUS: I certify that my clinical findings support that this patient is homebound (i.e., Due to illness or injury,  pt requires aid of supportive devices such as crutches, cane, wheelchairs, walkers, the use of special transportation or the assistance of another person to leave their place of residence. There is a normal inability to leave the home and doing so requires considerable and taxing effort. Other absences are for medical reasons /  religious services and are infrequent or of short duration when for other reasons). If current dressing causes regression in wound condition, may D/C ordered dressing product/s and apply Normal Saline Moist Dressing daily until next Wound Healing Center / Other MD appointment. Notify Wound Healing Center of regression in wound condition at [phone]. Please direct any NON-WOUND related issues/requests for orders to patient's Primary Care Physician Wound #2 Right Toe Third: Initiate Home Health for Skilled Nursing - Continue [facility] Continue Home Health Visits Home Health Nurse may visit PRN to address patient s wound care needs. FACE TO FACE ENCOUNTER: MEDICARE and MEDICAID PATIENTS: I certify that this patient is under my care and that I had a face-to-face encounter that meets the physician face-to-face encounter requirements with this patient on this date. The encounter with the patient was in whole or in part for the following MEDICAL CONDITION: (primary reason for Home Healthcare) MEDICAL NECESSITY: I certify, that based on my findings, NURSING services are a medically necessary home health service. HOME [name], [name] ([id number]) BOUND STATUS: I certify that my clinical findings support that this patient is homebound (i.e., Due to illness or injury, pt requires aid of supportive devices such as crutches, cane, wheelchairs, walkers, the use of special transportation or the assistance of another person to leave their place of residence. There is a normal inability to leave the home and doing so requires considerable and taxing effort. Other absences are for medical reasons / religious services and are infrequent or of short duration when for other reasons). If current dressing causes regression in wound condition, may D/C ordered dressing product/s and apply Normal Saline Moist Dressing daily until next Wound Healing Center / Other MD appointment. Notify Wound Healing Center of  regression in wound condition at [phone]. Please direct any NON-WOUND related issues/requests for orders to patient's Primary Care Physician Medications-please add to medication list.: Wound #1 Right Calcaneous: Santyl Enzymatic Ointment Wound #2 Right Toe Third: Santyl Enzymatic Ointment Radiology ordered were: X-ray, foot - R foot The following medication(s) was prescribed: Santyl topical 250 unit/gram ointment ointment topical as directed starting 02/11/2015 General Notes: Pt to follow up with [facility] for wound on (L) BKA Follow-Up Appointments: A follow-up appointment should be scheduled. 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 She has had an angioplasty of the right lower extremity done recently and in spite of this her right heel and right third to have a fairly deep ulceration and this is a fragment to ulceration. This is a Educational psychologist 2 ulceration. she will need a x-ray of the foot to see if there is any definite osteomyelitis and if this is still doubtful may need an MRI of the right foot. She has recently been on antibiotics and hence cultures have not been taken. regarding the amputation site on the left below-knee amputation I have recommended she gets in touch with her vascular surgeon who did the amputation as he may need to see her for possible  wound issues. I have recommended Santyl to both wounds and a light dressing to this and depending on what the x-rays of the foot show further plans will be made when she comes to visit [place] next week. [name], [name] ([id number]) Electronic Signature(s) Signed: 02/11/2015 1:26:02 PM By: Renee Harder RN Signed: 02/11/2015 2:50:30 PM By: Evlyn Kanner MD, FACS Previous Signature: 02/11/2015 12:54:03 PM Version  By: Evlyn Kanner MD, FACS Entered By: Renee Harder on 02/11/2015 13:26:02 [name] ([id number]) -------------------------------------------------------------------------------- ROS/PFSH Details Patient Name: [name] Date of Service: 02/11/2015 11:00 AM Medical Record Number: [id number] Patient Account Number: [id number] Date of Birth/Sex: 05-Oct-1920 (81 y.o. Female) Treating RN: Curtis Sites Primary Care Physician: Terance Hart, DAVID Other Clinician: Referring Physician: Treating Physician/Extender: Rudene Re in Treatment: 0 Information Obtained From Patient Wound History Do you currently have one or more open woundso Yes How many open wounds do you currently haveo 3 Approximately how long have you had your woundso 2-4 months How have you been treating your wound(s) until nowo dry dressings Has your wound(s) ever healed and then re-openedo No Have you had any lab work done in the past montho Yes Who ordered the lab work doneo [facility] Have you tested positive for an antibiotic resistant organism (MRSA, VRE)o No Have you tested positive for osteomyelitis (bone infection)o No Have you had any tests for circulation on your legso Yes Who ordered the testo Dr Theodoro Kalata Where was the test [facility] Eyes Complaints and Symptoms: Positive for: Glasses / Contacts Negative for: Dry Eyes; Vision Changes Medical History: Negative for: Cataracts; Glaucoma; Optic Neuritis Integumentary (Skin) Complaints and Symptoms: Positive for: Wounds - LLE Negative for: Bleeding or bruising tendency; Breakdown; Swelling Medical History: Negative for: History of Burn; History of pressure wounds Constitutional Symptoms (General Health) Complaints and Symptoms: No Complaints or Symptoms Ear/Nose/Mouth/Throat [name], [name] ([id number]) Complaints and Symptoms: No Complaints or Symptoms Medical History: Negative for: Chronic sinus problems/congestion; Middle ear  problems Hematologic/Lymphatic Complaints and Symptoms: No Complaints or Symptoms Medical History: Negative for: Anemia; Hemophilia; Human Immunodeficiency Virus; Lymphedema; Sickle Cell Disease Respiratory Complaints and Symptoms: No Complaints or Symptoms Medical History: Negative for: Aspiration; Asthma; Chronic Obstructive Pulmonary Disease (COPD); Pneumothorax; Sleep Apnea; Tuberculosis Cardiovascular Complaints and Symptoms: No Complaints or Symptoms Medical History: Positive for: Arrhythmia; Deep Vein Thrombosis; Peripheral Arterial Disease - RLE Negative for: Angina; Congestive Heart Failure; Coronary Artery Disease; Hypertension; Hypotension; Myocardial Infarction; Peripheral Venous Disease; Phlebitis; Vasculitis Gastrointestinal Complaints and Symptoms: No Complaints or Symptoms Medical History: Negative for: Cirrhosis ; Colitis; Crohnos; Hepatitis A; Hepatitis B; Hepatitis C Endocrine Complaints and Symptoms: No Complaints or Symptoms Medical History: Positive for: Type II Diabetes Negative for: Type I Diabetes Time with diabetes: 20+ years Treated with: Oral agents Herbel, Jamielynn ([id number]) Blood sugar tested every day: Yes Tested : QAM Genitourinary Complaints and Symptoms: No Complaints or Symptoms Medical History: Negative for: End Stage Renal Disease Immunological Complaints and Symptoms: No Complaints or Symptoms Medical History: Negative for: Lupus Erythematosus; Raynaudos; Scleroderma Musculoskeletal Complaints and Symptoms: No Complaints or Symptoms Medical History: Negative for: Gout; Rheumatoid Arthritis; Osteoarthritis; Osteomyelitis Past Medical History Notes: L BKA Neurologic Complaints and Symptoms: No Complaints or Symptoms Medical History: Negative for: Dementia; Neuropathy; Quadriplegia; Paraplegia; Seizure Disorder Oncologic Complaints and Symptoms: No Complaints or Symptoms Medical History: Negative for: Received  Chemotherapy; Received Radiation Psychiatric Complaints and Symptoms: No Complaints or Symptoms Medical History: Negative for: Anorexia/bulimia; Confinement Anxiety [name], [name] ([id number]) Family and Social History Cancer: No; Diabetes: Yes - Child;  Heart Disease: No; Hereditary Spherocytosis: No; Hypertension: No; Kidney Disease: No; Lung Disease: No; Seizures: No; Stroke: No; Thyroid Problems: No; Tuberculosis: No; Former smoker - 50 years ago; Alcohol Use: Never; Drug Use: No History; Caffeine Use: Moderate; Financial Concerns: No; Food, Clothing or Shelter Needs: No; Support System Lacking: No; Transportation Concerns: No; Advanced Directives: No; Patient does not want information on Advanced Directives Physician Affirmation I have reviewed and agree with the above information. Electronic Signature(s) Signed: 02/11/2015 12:54:03 PM By: Evlyn KannerBritto, Raeven Pint MD, FACS Signed: 02/11/2015 1:46:36 PM By: Curtis Sitesorthy, Joanna Previous Signature: 02/11/2015 12:05:56 PM Version By: Curtis Sitesorthy, Joanna Entered By: Evlyn KannerBritto, Velvet Moomaw on 02/11/2015 12:45:08 [name], [name] ([id number]) -------------------------------------------------------------------------------- SuperBill Details Patient Name: [name], [name] Date of Service: 02/11/2015 Medical Record Number: [id number] Patient Account Number: [id number] Date of Birth/Sex: 01/23/1921 (81 y.o. 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 L97.812 Non-pressure chronic ulcer of other part of right lower leg with fat layer  exposed Facility Procedures CPT4: Description Modifier Quantity Code [id number] 99212 - WOUND CARE VISIT-LEV 2 EST PT 1 CPT4: 5784696236100012 11042 - DEB SUBQ TISSUE 20 SQ CM/< 1 ICD-10 Description Diagnosis E11.621 Type 2 diabetes mellitus with foot ulcer I70.235 Atherosclerosis of native arteries of right leg with ulceration of other part of foot L97.412 Non-pressure chronic  ulcer of right heel and midfoot with fat layer exposed Physician Procedures CPT4: Description Modifier Quantity Code [id number] 99204 - WC PHYS LEVEL 4 - NEW PT 1 ICD-10 Description Diagnosis E11.621 Type 2 diabetes mellitus with foot ulcer I70.235 Atherosclerosis of native arteries of right leg with ulceration of other part of  foot Z89.512 Acquired absence of left leg below knee CPT4: [id number] 11042 - WC PHYS SUBQ TISS 20 SQ CM 1 ICD-10 Description Diagnosis [name], Kurstyn ([id number]) Electronic Signature(s) Signed: 02/11/2015 12:54:03 PM By: Evlyn KannerBritto, Kemyah Buser MD, FACS Entered By: Evlyn KannerBritto, Larken Urias on 02/11/2015 12:48:44

## 2015-02-18 ENCOUNTER — Encounter: Payer: Medicare Other | Admitting: Surgery

## 2015-02-18 DIAGNOSIS — L97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: Secondary | ICD-10-CM | POA: Diagnosis not present

## 2015-02-18 NOTE — Progress Notes (Signed)
[name], [name] ([id number]) Visit Report for 02/18/2015 Chief Complaint Document Details Patient Name: [name], [name] Date of Service: 02/18/2015 10:15 AM Medical Record Number: [id number] Patient Account Number: [id number] Date of Birth/Sex: [birth year] (81 y.o. Female) Treating RN: Primary Care Physician: Terance Hart, DAVID Other Clinician: Referring Physician: Terance Hart, DAVID Treating Physician/Extender: Rudene Re in Treatment: 1 Information Obtained from: Patient Chief Complaint Patient presents to the wound care center for a consult due non healing wound. 81 year old patient with comes with a history of a ulcerated area to the right third toe and the right heel which she's had for about 2 months. Electronic Signature(s) Signed: 02/18/2015 1:29:55 PM By: Evlyn Kanner MD, FACS Entered By: Evlyn Kanner on 02/18/2015 11:41:19 [name] ([id number]) -------------------------------------------------------------------------------- Debridement Details Patient Name: [name] Date of Service: 02/18/2015 10:15 AM Medical Record Number: [id number] Patient Account Number: [id number] Date of Birth/Sex: 30-Oct-1920 (81 y.o. Female) Treating RN: Primary Care Physician: Terance Hart, DAVID Other Clinician: Referring Physician: Terance Hart, DAVID Treating Physician/Extender: Rudene Re in Treatment: 1 Debridement Performed for Wound #1 Right Calcaneous Assessment: Performed By: Physician Tristan Schroeder., MD Debridement: Debridement Pre-procedure Yes Verification/Time Out Taken: Start Time: 11:25 Pain Control: Lidocaine 4% Topical Solution Level: Skin/Subcutaneous Tissue Total Area Debrided (L x 2.1 (cm) x 3.3 (cm) = 6.93 (cm) W): Tissue and other Viable, Non-Viable, Eschar, Fibrin/Slough, Subcutaneous material debrided: Instrument: Forceps, Scissors Bleeding: Minimum Hemostasis Achieved: Pressure End Time: 11:29 Procedural Pain: 2 Post Procedural Pain:  0 Response to Treatment: Procedure was tolerated well Post Debridement Measurements of Total Wound Length: (cm) 2.1 Width: (cm) 3.3 Depth: (cm) 0.2 Volume: (cm) 1.089 Electronic Signature(s) Signed: 02/18/2015 1:29:55 PM By: Evlyn Kanner MD, FACS Entered By: Evlyn Kanner on 02/18/2015 11:40:13 [name] ([id number]) -------------------------------------------------------------------------------- Debridement Details Patient Name: [name] Date of Service: 02/18/2015 10:15 AM Medical Record Number: [id number] Patient Account Number: [id number] Date of Birth/Sex: [birth year] (81 y.o. Female) Treating RN: Primary Care Physician: Terance Hart, DAVID Other Clinician: Referring Physician: Terance Hart, DAVID Treating Physician/Extender: Rudene Re in Treatment: 1 Debridement Performed for Wound #2 Right Toe Third Assessment: Performed By: Physician Tristan Schroeder., MD Debridement: Debridement Pre-procedure Yes Verification/Time Out Taken: Start Time: 11:23 Pain Control: Lidocaine 4% Topical Solution Level: Skin/Subcutaneous Tissue Total Area Debrided (L x 2.3 (cm) x 1.2 (cm) = 2.76 (cm) W): Tissue and other Viable, Non-Viable, Eschar, Fibrin/Slough, Subcutaneous material debrided: Instrument: Forceps, Scissors Bleeding: Minimum Hemostasis Achieved: Pressure End Time: 11:25 Procedural Pain: 3 Post Procedural Pain: 0 Response to Treatment: Procedure was tolerated well Post Debridement Measurements of Total Wound Length: (cm) 2.3 Width: (cm) 1.2 Depth: (cm) 0.2 Volume: (cm) 0.434 Electronic Signature(s) Signed: 02/18/2015 1:29:55 PM By: Evlyn Kanner MD, FACS Entered By: Evlyn Kanner on 02/18/2015 11:40:35 [name] ([id number]) -------------------------------------------------------------------------------- Debridement Details Patient Name: [name] Date of Service: 02/18/2015 10:15 AM Medical Record Number: [id number] Patient Account  Number: [id number] Date of Birth/Sex: 1921/08/18 (81 y.o. Female) Treating RN: Primary Care Physician: Terance Hart, DAVID Other Clinician: Referring Physician: Terance Hart, DAVID Treating Physician/Extender: Rudene Re in Treatment: 1 Debridement Performed for Wound #3 Left Amputation Site - Below Knee Assessment: Performed By: Physician Tristan Schroeder., MD Debridement: Debridement Pre-procedure Yes Verification/Time Out Taken: Start Time: 11:20 Pain Control: Lidocaine 4% Topical Solution Level: Skin/Subcutaneous Tissue Total Area Debrided (L x 2.2 (cm) x 6 (cm) = 13.2 (cm) W): Tissue and other Viable, Non-Viable, Eschar, Fibrin/Slough, Skin, Subcutaneous material debrided: Instrument: Forceps, Scissors Bleeding: Minimum Hemostasis Achieved: Pressure End Time: 11:22 Procedural Pain: 3 Post Procedural Pain: 0  Response to Treatment: Procedure was tolerated well Post Debridement Measurements of Total Wound Length: (cm) 2.2 Width: (cm) 6 Depth: (cm) 0.5 Volume: (cm) 5.184 Electronic Signature(s) Signed: 02/18/2015 1:29:55 PM By: Evlyn Kanner MD, FACS Entered By: Evlyn Kanner on 02/18/2015 11:41:02 [name] ([id number]) -------------------------------------------------------------------------------- HPI Details Patient Name: [name] Date of Service: 02/18/2015 10:15 AM Medical Record Number: [id number] Patient Account Number: [id number] Date of Birth/Sex: 11/30/1920 (81 y.o. Female) Treating RN: Primary Care Physician: Terance Hart, DAVID Other Clinician: Referring Physician: Terance Hart, DAVID Treating Physician/Extender: Rudene Re in Treatment: 1 History of Present Illness Location: right medial heel and right third toe Quality: Patient reports experiencing a dull pain to affected area(s). Severity: Patient states wound are getting worse. Duration: Patient has had the wound for > 3 months prior to seeking treatment at the wound center Timing:  Pain in wound is Intermittent (comes and goes Context: The wound appeared gradually over time Modifying Factors: Consults to this date include:vascular surgeon and several recent surgeries. Associated Signs and Symptoms: Patient reports having foul odor and drainage from the left below-knee amputation site. HPI Description: A pleasant 81 year old patient who is known to have diabetes mellitus for several years has recently gone through a series of operations with the vascular surgeon Dr. Gilda Crease. In January and February she had several surgeries on the left lower extremity with an attempt to have limb salvage for gangrenous changes of her forefoot. 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 On 01/07/2015 she also had a right lower extremity distal runoff with a angioplasty of the right anterior tibial artery to maximize her blood flow to the foot. She recently had her staples removed and Dr. Elzie Rings office on 01/31/2015 and at that time a right lower extremity duplex was done which showed patent vessels and a patent stent with distal occluded posterior tibial artery. Though the duplex was noncritical the recommendations from the PA at the vascular surgery office was that of a angiogram to be done but the patient said she would rather try some bone care before. The patient has received doxycycline and Cipro in the recent past and she takes oral medications for her diabetes. Other than that I reviewed her list of all her medications. No recent hemoglobin A1c has been done and no recent x-rays of the right foot have been done. 02/18/2015 -- x-ray of the right foot was done on 02/11/2015 and it shows no evidence of acute osteomyelitis of the third toe or of the calcaneus. She had gone to the vascular surgery office and they had noted that there is dehiscence of the left part of the amputation site of the below-knee wound  and they have asked [place] to kindly take over the care of this. There've been doing dressings for the right heel and right third toe. Electronic Signature(s) Signed: 02/18/2015 1:29:55 PM By: Evlyn Kanner MD, FACS Entered By: Evlyn Kanner on 02/18/2015 11:42:07 [name] ([id number]) -------------------------------------------------------------------------------- Physical Exam Details Patient Name: [name] Date of Service: 02/18/2015 10:15 AM Medical Record Number: [id number] Patient Account Number: [id number] Date of Birth/Sex: 1921/09/03 (81 y.o. Female) Treating RN: Primary Care Physician: Terance Hart, DAVID Other Clinician: Referring Physician: Terance Hart, DAVID Treating Physician/Extender: Rudene Re in Treatment: 1 Constitutional . Pulse regular. Respirations normal and unlabored. Afebrile. . Eyes Nonicteric. Reactive to light. Ears, Nose, Mouth, and Throat Lips, teeth, and gums WNL.Marland Kitchen Moist mucosa without lesions . Neck supple and nontender. No palpable  supraclavicular or cervical adenopathy. Normal sized without goiter. Respiratory WNL. No retractions.. Cardiovascular Pedal Pulses WNL. No clubbing, cyanosis or edema. Integumentary (Hair, Skin) the left below-knee amputation site has some full-thickness skin loss and necrotic tissue in the lateral half of the wound. This will need sharp debridement.. No crepitus or fluctuance. No peri-wound warmth or erythema. No masses.Marland Kitchen. Psychiatric Judgement and insight Intact.. No evidence of depression, anxiety, or agitation.. Electronic Signature(s) Signed: 02/18/2015 1:29:55 PM By: Evlyn KannerBritto, Yousaf Sainato MD, FACS Entered By: Evlyn KannerBritto, Tocarra Gassen on 02/18/2015 11:43:40 [name], [name] ([id number]) -------------------------------------------------------------------------------- Physician Orders Details Patient Name: [name], [name] Date of Service: 02/18/2015 10:15 AM Medical Record Number: [id number] Patient Account Number:  [id number] Date of Birth/Sex: 08/10/1921 (81 y.o. Female) Treating RN: Renee HarderMabry, Kelsey Primary Care Physician: Dorothey BasemanBRONSTEIN, DAVID Other Clinician: Referring Physician: Dorothey BasemanBRONSTEIN, DAVID Treating Physician/Extender: Rudene ReBritto, Jelan Batterton Weeks in Treatment: 1 Verbal / Phone Orders: Yes Clinician: Renee HarderMabry, Kelsey Read Back and Verified: Yes Diagnosis Coding Wound Cleansing Wound #1 Right Calcaneous o Clean wound with Normal Saline. o Clean wound with Normal Saline. Wound #2 Right Toe Third o Clean wound with Normal Saline. o Clean wound with Normal Saline. Wound #3 Left Amputation Site - Below Knee o Clean wound with Normal Saline. o Clean wound with Normal Saline. Anesthetic Wound #1 Right Calcaneous o Topical Lidocaine 4% cream applied to wound bed prior to debridement Wound #2 Right Toe Third o Topical Lidocaine 4% cream applied to wound bed prior to debridement Wound #3 Left Amputation Site - Below Knee o Topical Lidocaine 4% cream applied to wound bed prior to debridement Primary Wound Dressing Wound #1 Right Calcaneous o Santyl Ointment Wound #2 Right Toe Third o Santyl Ointment Wound #3 Left Amputation Site - Below Knee o Santyl Ointment o Pack wound with: - pack undermining with 1/2" packing strip Secondary Dressing Wound #1 Right Calcaneous o Gauze and Kerlix/Conform Riffe, Tacha ([id number]) Wound #2 Right Toe Third o Gauze and Kerlix/Conform Wound #3 Left Amputation Site - Below Knee o Boardered Foam Dressing Dressing Change Frequency Wound #1 Right Calcaneous o Change dressing every day. Wound #2 Right Toe Third o Change dressing every day. Wound #3 Left Amputation Site - Below Knee o Change dressing every day. Follow-up Appointments Wound #1 Right Calcaneous o Return Appointment in 1 week. Wound #2 Right Toe Third o Return Appointment in 1 week. Wound #3 Left Amputation Site - Below Knee o Return Appointment in 1  week. Off-Loading Wound #1 Right Calcaneous o Open toe surgical shoe to: - right foot o Other: - Offloading boot at night right foot Home Health Wound #1 Right Calcaneous o Initiate Home Health for Skilled Nursing - Continue [facility] o Continue Home Health Visits o Home Health Nurse may visit PRN to address patientos wound care needs. o FACE TO FACE ENCOUNTER: MEDICARE and MEDICAID PATIENTS: I certify that this patient is under my care and that I had a face-to-face encounter that meets the physician face-to-face encounter requirements with this patient on this date. The encounter with the patient was in whole or in part for the following MEDICAL CONDITION: (primary reason for Home Healthcare) MEDICAL NECESSITY: I certify, that based on my findings, NURSING services are a medically necessary home health service. HOME BOUND STATUS: I certify that my clinical findings support that this patient is homebound (i.e., Due to illness or injury, pt requires aid of supportive devices such as crutches, cane, wheelchairs, walkers, the use of special transportation or the assistance of another person to leave their place of  residence. There is a normal inability to leave the home and doing so requires considerable and taxing effort. Shawneequa Baldridge, Vonnie ([id number]) absences are for medical reasons / religious services and are infrequent or of short duration when for other reasons). o If current dressing causes regression in wound condition, may D/C ordered dressing product/s and apply Normal Saline Moist Dressing daily until next Wound Healing Center / Other MD appointment. Notify Wound Healing Center of regression in wound condition at [phone]. o Please direct any NON-WOUND related issues/requests for orders to patient's Primary Care Physician Wound #2 Right Toe Third o Initiate Home Health for Skilled Nursing - Continue Tomasa Hosteller Continue Home Health Visits o Home  Health Nurse may visit PRN to address patientos wound care needs. o FACE TO FACE ENCOUNTER: MEDICARE and MEDICAID PATIENTS: I certify that this patient is under my care and that I had a face-to-face encounter that meets the physician face-to-face encounter requirements with this patient on this date. The encounter with the patient was in whole or in part for the following MEDICAL CONDITION: (primary reason for Home Healthcare) MEDICAL NECESSITY: I certify, that based on my findings, NURSING services are a medically necessary home health service. HOME BOUND STATUS: I certify that my clinical findings support that this patient is homebound (i.e., Due to illness or injury, pt requires aid of supportive devices such as crutches, cane, wheelchairs, walkers, the use of special transportation or the assistance of another person to leave their place of residence. There is a normal inability to leave the home and doing so requires considerable and taxing effort. Other absences are for medical reasons / religious services and are infrequent or of short duration when for other reasons). o If current dressing causes regression in wound condition, may D/C ordered dressing product/s and apply Normal Saline Moist Dressing daily until next Wound Healing Center / Other MD appointment. Notify Wound Healing Center of regression in wound condition at [phone]. o Please direct any NON-WOUND related issues/requests for orders to patient's Primary Care Physician Wound #3 Left Amputation Site - Below Knee o Initiate Home Health for Skilled Nursing - Continue Tomasa Hosteller Continue Home Health Visits o Home Health Nurse may visit PRN to address patientos wound care needs. o FACE TO FACE ENCOUNTER: MEDICARE and MEDICAID PATIENTS: I certify that this patient is under my care and that I had a face-to-face encounter that meets the physician face-to-face encounter requirements with this patient on this  date. The encounter with the patient was in whole or in part for the following MEDICAL CONDITION: (primary reason for Home Healthcare) MEDICAL NECESSITY: I certify, that based on my findings, NURSING services are a medically necessary home health service. HOME BOUND STATUS: I certify that my clinical findings support that this patient is homebound (i.e., Due to illness or injury, pt requires aid of supportive devices such as crutches, cane, wheelchairs, walkers, the use of special transportation or the assistance of another person to leave their place of residence. There is a normal inability to leave the home and doing so requires considerable and taxing effort. Other absences are for medical reasons / religious services and are infrequent or of short duration when for other reasons). o If current dressing causes regression in wound condition, may D/C ordered dressing product/s and apply Normal Saline Moist Dressing daily until next Wound Healing Center / Other MD appointment. Notify Wound Healing Center of regression in wound condition at [phone]. [name], [name] ([id number]) o Please direct any NON-WOUND related  issues/requests for orders to patient's Primary Care Physician Medications-please add to medication list. Wound #1 Right Calcaneous o Santyl Enzymatic Ointment Wound #2 Right Toe Third o Santyl Enzymatic Ointment Wound #3 Left Amputation Site - Below Knee o Santyl Enzymatic Ointment Electronic Signature(s) Signed: 02/18/2015 1:29:55 PM By: Evlyn Kanner MD, FACS Signed: 02/18/2015 3:19:03 PM By: Renee Harder RN Entered By: Renee Harder on 02/18/2015 11:31:54 [name] ([id number]) -------------------------------------------------------------------------------- Problem List Details Patient Name: [name] Date of Service: 02/18/2015 10:15 AM Medical Record Number: [id number] Patient Account Number: [id number] Date of Birth/Sex: 10/08/1920 (81 y.o.  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 02/11/2015 Yes fat layer exposed L97.812 Non-pressure chronic ulcer of other part of right lower leg 02/11/2015 Yes with fat layer exposed T81.31XA Disruption of external operation (surgical) wound, not 02/18/2015 Yes elsewhere classified, initial encounter Inactive Problems Resolved Problems Electronic Signature(s) Signed: 02/18/2015 1:29:55 PM By: Evlyn Kanner MD, FACS Entered By: Evlyn Kanner on 02/18/2015 11:39:47 [name] ([id number]) -------------------------------------------------------------------------------- Progress Note Details Patient Name: [name] Date of Service: 02/18/2015 10:15 AM Medical Record Number: [id number] Patient Account Number: [id number] Date of Birth/Sex: 1921-01-27 (81 y.o. Female) Treating RN: Primary Care Physician: Terance Hart, DAVID Other Clinician: Referring Physician: Terance Hart, DAVID Treating Physician/Extender: Rudene Re in Treatment: 1 Subjective Chief Complaint Information obtained from Patient Patient presents to the wound care center for a consult due non healing wound. 81 year old patient with comes with a history of a ulcerated area to the right third toe and the right heel which she's had for about 2 months. History of Present Illness (HPI) The following HPI elements were documented for the patient's wound: Location: right medial heel and right third toe Quality:  Patient reports experiencing a dull pain to affected area(s). Severity: Patient states wound are getting worse. Duration: Patient has had the wound for > 3 months prior to seeking treatment at the wound center Timing: Pain in wound is Intermittent (comes and goes Context: The wound appeared gradually over time Modifying Factors: Consults to this date include:vascular surgeon and several recent surgeries. Associated Signs and Symptoms: Patient reports having foul odor and drainage from the left below-knee amputation site. A pleasant 81 year old patient who is known to have diabetes mellitus for several years has recently gone through a series of operations with the vascular surgeon Dr. Gilda Crease. In January and February she had several surgeries on the left lower extremity with an attempt to have limb salvage for gangrenous changes of her forefoot. 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 On 01/07/2015 she also had a right lower extremity distal runoff with a angioplasty of the right anterior tibial artery to maximize her blood flow to the foot. She recently had her staples removed and Dr. Elzie Rings office on 01/31/2015 and at that time a right lower extremity duplex was done which showed patent vessels and a patent stent with distal occluded posterior tibial artery. Though the duplex was noncritical the recommendations from the PA at the vascular surgery office was that of a angiogram  to be done but the patient said she would rather try some bone care before. The patient has received doxycycline and Cipro in the recent past and she takes oral medications for her diabetes. Other than that I reviewed her list of all her medications. No recent hemoglobin A1c has been done and no recent x-rays of the right foot have been done. 02/18/2015 -- x-ray of the right foot was done on 02/11/2015 and it shows no evidence of  acute osteomyelitis of the third toe or of the calcaneus. She had gone to the vascular surgery office and they had noted that there is dehiscence of the left part of the amputation site of the below-knee wound and they have asked [place] to kindly take over the care of this. There've been doing dressings for the right heel and right third toe. [name], [name] ([id number]) Objective Constitutional Pulse regular. Respirations normal and unlabored. Afebrile. Vitals Time Taken: 10:52 AM, Height: 62 in, Weight: 155 lbs, BMI: 28.3, Temperature: 98.3 F, Pulse: 58 bpm, Respiratory Rate: 18 breaths/min, Blood Pressure: 115/48 mmHg. Eyes Nonicteric. Reactive to light. Ears, Nose, Mouth, and Throat Lips, teeth, and gums WNL.Marland Kitchen Moist mucosa without lesions . Neck supple and nontender. No palpable supraclavicular or cervical adenopathy. Normal sized without goiter. Respiratory WNL. No retractions.. Cardiovascular Pedal Pulses WNL. No clubbing, cyanosis or edema. Psychiatric Judgement and insight Intact.. No evidence of depression, anxiety, or agitation.. Integumentary (Hair, Skin) the left below-knee amputation site has some full-thickness skin loss and necrotic tissue in the lateral half of the wound. This will need sharp debridement.. No crepitus or fluctuance. No peri-wound warmth or erythema. No masses.. Wound #1 status is Open. Original cause of wound was Gradually Appeared. The wound is located on the Right Calcaneous. The wound measures 2.1cm length x 3.3cm width x 0.1cm depth; 5.443cm^2 area and 0.544cm^3 volume. The wound is limited to skin breakdown. There is no tunneling or undermining noted. There is a large amount of purulent drainage noted. The wound margin is flat and intact. There is no granulation within the wound bed. There is a large (67-100%) amount of necrotic tissue within the wound bed including Eschar and Adherent Slough. The periwound skin appearance exhibited: Erythema.  The periwound skin appearance did not exhibit: Callus, Crepitus, Excoriation, Fluctuance, Friable, Induration, Localized Edema, Rash, Scarring, Dry/Scaly, Maceration, Moist, Atrophie Blanche, Cyanosis, Ecchymosis, Hemosiderin Staining, Mottled, Pallor, Rubor. The surrounding wound skin color is noted with erythema which is circumferential. Periwound temperature was noted as No Abnormality. The periwound has Moscoso, Akela ([id number]) tenderness on palpation. Wound #2 status is Open. Original cause of wound was Gradually Appeared. The wound is located on the Right Toe Third. The wound measures 2.3cm length x 1.2cm width x 0.1cm depth; 2.168cm^2 area and 0.217cm^3 volume. The wound is limited to skin breakdown. There is no tunneling or undermining noted. There is a small amount of purulent drainage noted. The wound margin is flat and intact. There is no granulation within the wound bed. There is a large (67-100%) amount of necrotic tissue within the wound bed including Eschar and Adherent Slough. The periwound skin appearance exhibited: Erythema. The periwound skin appearance did not exhibit: Callus, Crepitus, Excoriation, Fluctuance, Friable, Induration, Localized Edema, Rash, Scarring, Dry/Scaly, Maceration, Moist, Atrophie Blanche, Cyanosis, Ecchymosis, Hemosiderin Staining, Mottled, Pallor, Rubor. The surrounding wound skin color is noted with erythema which is circumferential. Periwound temperature was noted as No Abnormality. The periwound has tenderness on palpation. Wound #3 status is Open. Original cause of wound was  Surgical Injury. The wound is located on the Left Amputation Site - Below Knee. The wound measures 2.2cm length x 6cm width x 0.4cm depth; 10.367cm^2 area and 4.147cm^3 volume. The wound is limited to skin breakdown. There is no tunneling or undermining noted. There is a large amount of serous drainage noted. The wound margin is distinct with the outline attached to the  wound base. There is no granulation within the wound bed. There is a large (67-100%) amount of necrotic tissue within the wound bed including Eschar and Adherent Slough. The periwound skin appearance did not exhibit: Callus, Crepitus, Excoriation, Fluctuance, Friable, Induration, Localized Edema, Rash, Scarring, Dry/Scaly, Maceration, Moist, Atrophie Blanche, Cyanosis, Ecchymosis, Hemosiderin Staining, Mottled, Pallor, Rubor, Erythema. Periwound temperature was noted as No Abnormality. The periwound has tenderness on palpation. the left below-knee amputation site has some full-thickness skin loss and necrotic tissue in the lateral half of the wound. This will need sharp debridement. The right calcaneal area and the right third toe have some full-thickness skin loss and subcutaneous debris which will be sharply debrided. 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 L97.812 - Non-pressure chronic ulcer of other part of right lower leg with fat layer exposed T81.31XA - Disruption of external operation (surgical) wound, not elsewhere classified, initial encounter Diagnoses ICD-10 E11.621: Type 2 diabetes mellitus with foot ulcer [name], [name] ([id number]) I70.235: Atherosclerosis of native arteries of right leg with ulceration of other part of foot Z89.512: Acquired absence of left leg below knee L97.412: Non-pressure chronic ulcer of right heel and midfoot with fat layer exposed L97.812: Non-pressure chronic ulcer of other part of right lower leg with fat layer exposed T81.31XA: Disruption of external operation (surgical) wound, not elsewhere classified, initial encounter I have recommended central to all the wounds  including the left BKA stump. Dressing has been discussed with the family members the granddaughter. 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 There was a Skin/Subcutaneous Tissue Debridement (40981-19147) debridement with total area of 6.93 sq cm performed by Tiny Chaudhary, Ignacia Felling., MD. with the following instrument(s): Forceps and Scissors to remove Viable and Non-Viable tissue/material including Fibrin/Slough, Eschar, and Subcutaneous after achieving pain control using Lidocaine 4% Topical Solution. A time out was conducted prior to the start of the procedure. A Minimum amount of bleeding was controlled with Pressure. The procedure was tolerated well with a pain level of 2 throughout and a pain level of 0 following the procedure. Post Debridement Measurements: 2.1cm length x 3.3cm width x 0.2cm depth; 1.089cm^3 volume. 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 There was a Skin/Subcutaneous Tissue Debridement (82956-21308) debridement with total area of 2.76 sq cm performed by Anela Bensman, Ignacia Felling., MD. with the following instrument(s): Forceps and Scissors to remove Viable and Non-Viable tissue/material including Fibrin/Slough, Eschar, and Subcutaneous after achieving pain control using Lidocaine 4% Topical Solution. A time out was conducted prior to the start of the procedure. A Minimum amount of bleeding was controlled with Pressure. The procedure was tolerated  well with a pain level of 3 throughout and a pain level of 0 following the procedure. Post Debridement Measurements: 2.3cm length x 1.2cm width x 0.2cm depth; 0.434cm^3 volume. Wound #3 Wound #3 is a Dehisced Wound located on the Left Amputation Site - Below Knee . There was a Skin/Subcutaneous Tissue  Debridement (40981-19147) debridement with total area of 13.2 sq cm performed by Belma Dyches, Ignacia Felling., MD. with the following instrument(s): Forceps and Scissors to remove Viable and Non-Viable tissue/material including Fibrin/Slough, Eschar, Skin, and Subcutaneous after achieving pain control using Lidocaine 4% Topical Solution. A time out was conducted prior to the start of the procedure. A Minimum amount of bleeding was controlled with Pressure. The procedure was tolerated well with a pain level of 3 throughout and a pain level of 0 following the procedure. Post Debridement Measurements: 2.2cm length x 6cm width x 0.5cm depth; 5.184cm^3 volume. [name], [name] ([id number]) Plan Wound Cleansing: Wound #1 Right Calcaneous: Clean wound with Normal Saline. Clean wound with Normal Saline. Wound #2 Right Toe Third: Clean wound with Normal Saline. Clean wound with Normal Saline. Wound #3 Left Amputation Site - Below Knee: Clean wound with Normal Saline. Clean wound with Normal Saline. Anesthetic: Wound #1 Right Calcaneous: Topical Lidocaine 4% cream applied to wound bed prior to debridement Wound #2 Right Toe Third: Topical Lidocaine 4% cream applied to wound bed prior to debridement Wound #3 Left Amputation Site - Below Knee: Topical Lidocaine 4% cream applied to wound bed prior to debridement Primary Wound Dressing: Wound #1 Right Calcaneous: Santyl Ointment Wound #2 Right Toe Third: Santyl Ointment Wound #3 Left Amputation Site - Below Knee: Santyl Ointment Pack wound with: - pack undermining with 1/2" packing strip Secondary Dressing: Wound #1 Right Calcaneous: Gauze and Kerlix/Conform Wound #2 Right Toe Third: Gauze and Kerlix/Conform Wound #3 Left Amputation Site - Below Knee: Boardered Foam Dressing Dressing Change Frequency: Wound #1 Right Calcaneous: Change dressing every day. Wound #2 Right Toe Third: Change dressing every day. Wound #3 Left Amputation Site - Below  Knee: Change dressing every day. Follow-up Appointments: Wound #1 Right Calcaneous: Return Appointment in 1 week. Wound #2 Right Toe Third: Return Appointment in 1 week. 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 Off-Loading: Wound #1 Right Calcaneous: [name], [name] ([id number]) Open toe surgical shoe to: - right foot Other: - Offloading boot at night right foot Home Health: Wound #1 Right Calcaneous: Initiate Home Health for Skilled Nursing - Continue [facility] Continue Home Health Visits Home Health Nurse may visit PRN to address patient s wound care needs. FACE TO FACE ENCOUNTER: MEDICARE and MEDICAID PATIENTS: I certify that this patient is under my care and that I had a face-to-face encounter that meets the physician face-to-face encounter requirements with this patient on this date. The encounter with the patient was in whole or in part for the following MEDICAL CONDITION: (primary reason for Home Healthcare) MEDICAL NECESSITY: I certify, that based on my findings, NURSING services are a medically necessary home health service. HOME BOUND STATUS: I certify that my clinical findings support that this patient is homebound (i.e., Due to illness or injury, pt requires aid of supportive devices such as crutches, cane, wheelchairs, walkers, the use of special transportation or the assistance of another person to leave their place of residence. There is a normal inability to leave the home and doing so requires considerable and taxing effort. Other absences are for medical reasons / religious services and are infrequent or  of short duration when for other reasons). If current dressing causes regression in wound condition, may D/C ordered dressing product/s and apply Normal Saline Moist Dressing daily until next Wound Healing Center / Other MD appointment. Notify Wound Healing Center of regression in wound condition at [phone]. Please direct any  NON-WOUND related issues/requests for orders to patient's Primary Care Physician Wound #2 Right Toe Third: Initiate Home Health for Skilled Nursing - Continue [facility] Continue Home Health Visits Home Health Nurse may visit PRN to address patient s wound care needs. FACE TO FACE ENCOUNTER: MEDICARE and MEDICAID PATIENTS: I certify that this patient is under my care and that I had a face-to-face encounter that meets the physician face-to-face encounter requirements with this patient on this date. The encounter with the patient was in whole or in part for the following MEDICAL CONDITION: (primary reason for Home Healthcare) MEDICAL NECESSITY: I certify, that based on my findings, NURSING services are a medically necessary home health service. HOME BOUND STATUS: I certify that my clinical findings support that this patient is homebound (i.e., Due to illness or injury, pt requires aid of supportive devices such as crutches, cane, wheelchairs, walkers, the use of special transportation or the assistance of another person to leave their place of residence. There is a normal inability to leave the home and doing so requires considerable and taxing effort. Other absences are for medical reasons / religious services and are infrequent or of short duration when for other reasons). If current dressing causes regression in wound condition, may D/C ordered dressing product/s and apply Normal Saline Moist Dressing daily until next Wound Healing Center / Other MD appointment. Notify Wound Healing Center of regression in wound condition at [phone]. Please direct any NON-WOUND related issues/requests for orders to patient's Primary Care Physician Wound #3 Left Amputation Site - Below Knee: Initiate Home Health for Skilled Nursing - Continue [facility] Continue Home Health Visits Home Health Nurse may visit PRN to address patient s wound care needs. FACE TO FACE ENCOUNTER: MEDICARE and MEDICAID PATIENTS: I  certify that this patient is under my care and that I had a face-to-face encounter that meets the physician face-to-face encounter requirements with this patient on this date. The encounter with the patient was in whole or in part for the following MEDICAL CONDITION: (primary reason for Home Healthcare) MEDICAL NECESSITY: I certify, that based on my findings, NURSING services are a medically necessary home health service. HOME BOUND STATUS: I certify that my clinical findings support that this patient is homebound (i.e., Due to illness or injury, pt requires aid of supportive devices such as crutches, cane, wheelchairs, walkers, the use of special transportation or the assistance of another person to leave their place of residence. There is a [name], [name] ([id number]) normal inability to leave the home and doing so requires considerable and taxing effort. Other absences are for medical reasons / religious services and are infrequent or of short duration when for other reasons). If current dressing causes regression in wound condition, may D/C ordered dressing product/s and apply Normal Saline Moist Dressing daily until next Wound Healing Center / Other MD appointment. Notify Wound Healing Center of regression in wound condition at [phone]. Please direct any NON-WOUND related issues/requests for orders to patient's Primary Care Physician Medications-please add to medication list.: Wound #1 Right Calcaneous: Santyl Enzymatic Ointment Wound #2 Right Toe Third: Santyl Enzymatic Ointment Wound #3 Left Amputation Site - Below Knee: Santyl Enzymatic Ointment I have recommended central to all the  wounds including the left BKA stump. Dressing has been discussed with the family members the granddaughter. 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 Electronic Signature(s) Signed: 02/18/2015 3:06:18  PM By: Renee Harder RN Signed: 02/18/2015 3:17:08 PM By: Evlyn Kanner MD, FACS Previous Signature: 02/18/2015 1:29:55 PM Version By: Evlyn Kanner MD, FACS Entered By: Renee Harder on 02/18/2015 15:06:18 [name] ([id number]) -------------------------------------------------------------------------------- SuperBill Details Patient Name: [name] Date of Service: 02/18/2015 Medical Record Number: [id number] Patient Account Number: [id number] Date of Birth/Sex: [birth year] (81 y.o. 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 L97.812 Non-pressure chronic ulcer of other part of right lower leg with fat layer exposed Disruption of external operation (surgical) wound, not elsewhere classified, initial T81.31XA encounter Facility Procedures CPT4: Description Modifier Quantity Code [id number] 11042 - DEB SUBQ TISSUE 20 SQ CM/< 1 ICD-10 Description Diagnosis E11.621 Type 2 diabetes mellitus with foot ulcer Z89.512 Acquired absence of left leg below knee I70.235 Atherosclerosis of native  arteries of right leg with ulceration of other part of foot T81.31XA Disruption of external operation (surgical) wound, not elsewhere classified, initial encounter CPT4: [id number] 11045 - DEB SUBQ TISS EA ADDL 20CM 1 ICD-10 Description Diagnosis E11.621 Type 2 diabetes mellitus with foot ulcer I70.235 Atherosclerosis of native arteries of right leg with ulceration of other part of foot L97.412 Non-pressure chronic  ulcer of right heel and midfoot with  fat layer exposed T81.31XA Disruption of external operation (surgical) wound, not elsewhere classified, initial encounter Physician Procedures : ALDEA, AVIS DescriptionZannie Cove ([id number]) Modifier: Quantity: Electronic Signature(s) Signed: 02/18/2015 1:29:55 PM By: Evlyn Kanner MD, FACS Entered By: Evlyn Kanner on 02/18/2015 11:45:14

## 2015-02-18 NOTE — Progress Notes (Signed)
[name], [name] ([id number]) Visit Report for 02/18/2015 Arrival Information Details Patient Name: [name], [name] Date of Service: 02/18/2015 10:15 AM Medical Record Number: [id number] Patient Account Number: [id number] Date of Birth/Sex: 1921-07-20 (81 y.o. Female) Treating RN: [name] Primary Care Physician: Dorothey Baseman Other Clinician: Referring Physician: Dorothey Baseman Treating Physician/Extender: [name] in Treatment: 1 Visit Information History Since Last Visit Added or deleted any medications: No Patient Arrived: Wheel Chair Any new allergies or adverse reactions: No Arrival Time: 10:50 Had a fall or experienced change in No Accompanied By: granddaughter activities of daily living that may affect Transfer Assistance: Manual risk of falls: Patient Identification Verified: Yes Signs or symptoms of abuse/neglect since last No Secondary Verification Process Yes visito Completed: Hospitalized since last visit: No Patient Has Alerts: Yes Pain Present Now: Yes Patient Alerts: DMII Electronic Signature(s) Signed: 02/18/2015 3:12:27 PM By: [name] Entered By: [name] on 02/18/2015 10:52:52 [name] ([id number]) -------------------------------------------------------------------------------- Encounter Discharge Information Details Patient Name: [name] Date of Service: 02/18/2015 10:15 AM Medical Record Number: [id number] Patient Account Number: [id number] Date of Birth/Sex: 1920/12/24 (81 y.o. Female) Treating RN: Primary Care Physician: Terance Hart, DAVID Other Clinician: Referring Physician: Terance Hart, DAVID Treating Physician/Extender: [name] in Treatment: 1 Encounter Discharge Information Items Schedule Follow-up Appointment: No Medication Reconciliation completed No and provided to Patient/Care [name]: Provided on Clinical Summary of Care: 02/18/2015 Form Type Recipient Paper Patient HS Electronic  Signature(s) Signed: 02/18/2015 11:35:55 AM By: [name] Entered By: [name] on 02/18/2015 11:35:54 [name] ([id number]) -------------------------------------------------------------------------------- Lower Extremity Assessment Details Patient Name: [name] Date of Service: 02/18/2015 10:15 AM Medical Record Number: [id number] Patient Account Number: [id number] Date of Birth/Sex: [birth year] (81 y.o. Female) Treating RN: [name] Primary Care Physician: Dorothey Baseman Other Clinician: Referring Physician: Terance Hart, DAVID Treating Physician/Extender: [name] in Treatment: 1 Vascular Assessment Pulses: Posterior Tibial Palpable: [Right:No] Dorsalis Pedis Palpable: [Right:No] Extremity colors, hair growth, and conditions: Extremity Color: [Right:Normal] Hair Growth on Extremity: [Right:No] Temperature of Extremity: [Right:Warm] Capillary Refill: [Right:< 3 seconds] Toe Nail Assessment Left: Right: Thick: No Discolored: No Deformed: No Improper Length and Hygiene: No Electronic Signature(s) Signed: 02/18/2015 3:12:27 PM By: [name] Entered By: [name] on 02/18/2015 11:02:45 [name] ([id number]) -------------------------------------------------------------------------------- Multi Wound Chart Details Patient Name: [name] Date of Service: 02/18/2015 10:15 AM Medical Record Number: [id number] Patient Account Number: [id number] Date of Birth/Sex: 06-Jul-1921 (81 y.o. 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] Date Acquired: [1:11/04/2014] [2:11/04/2014] [3:01/03/2015] Weeks of Treatment: [1:1] [2:1] [3:0] Wound Status: [1:Open] [2:Open] [3:Open] Measurements L x W x D 2.1x3.3x0.1 [2:2.3x1.2x0.1] [3:2.2x6x0.4] (cm) Area (cm) : [1:5.443] [2:2.168] [3:10.367] Volume (cm) : [1:0.544] [2:0.217] [3:4.147] % Reduction in Area: [1:1.00%] [2:-130.10%] [3:N/A] % Reduction in Volume: 1.10% [2:-130.90%] [3:N/A] Classification: [1:Full Thickness Without Exposed Support Structures] [2:Full Thickness Without Exposed Support Structures] [3:N/A] Periwound Skin Texture: No Abnormalities Noted No Abnormalities Noted No Abnormalities Noted Periwound Skin [1:No Abnormalities Noted No Abnormalities Noted No Abnormalities Noted] Moisture: Periwound Skin Color: No Abnormalities Noted No Abnormalities Noted No Abnormalities Noted  Tenderness on [1:No] [2:No] [3:No] Treatment Notes Electronic Signature(s) Signed: 02/18/2015 3:19:03 PM By: [name] HarderMabry, Kelsey RN [name] ([id number]) Entered By: [name] on 02/18/2015 11:18:00 [name] NationsSILETZKY, [name] ([id number]) -------------------------------------------------------------------------------- Multi-Disciplinary Care Plan Details Patient Name: [name] Date of Service: 02/18/2015 10:15 AM Medical Record Number: [id number] Patient Account Number: [id number] Date of Birth/Sex: 06/13/1921 (81 y.o. Female) Treating RN: [name] Primary Care Physician: Dorothey BasemanBRONSTEIN, DAVID Other Clinician: Referring Physician: Dorothey BasemanBRONSTEIN, DAVID Treating Physician/Extender: [name] Weeks in Treatment: 1 Active Inactive Abuse / Safety / Falls / Self Care Management Nursing Diagnoses: Impaired physical mobility Potential for  falls Goals: Patient will remain injury free Date Initiated: 02/11/2015 Goal Status: Active Patient/caregiver will verbalize understanding of skin care regimen Date Initiated: 02/11/2015 Goal Status: Active Patient/caregiver will verbalize/demonstrate measures taken to prevent injury and/or falls Date Initiated: 02/11/2015 Goal Status: Active Patient/caregiver will verbalize/demonstrate understanding of what to do in case of emergency Date Initiated: 02/11/2015 Goal Status: Active Interventions: Assess fall risk on admission and as needed Provide education on fall prevention Provide education on safe transfers Treatment Activities: Patient referred to home care : 02/18/2015 Notes: Nutrition Nursing Diagnoses: Imbalanced nutrition Potential for alteratiion in Nutrition/Potential for imbalanced nutrition Rybka, Clova ([id number]) Goals: Patient/caregiver verbalizes understanding of need to maintain therapeutic glucose control per primary care physician Date Initiated: 02/11/2015 Goal Status: Active Patient/caregiver will maintain therapeutic glucose control Date Initiated: 02/11/2015 Goal Status: Active Interventions: Assess HgA1c results as ordered upon admission and as needed Provide education on elevated blood sugars and impact on wound healing Provide education on nutrition Treatment Activities: Obtain HgA1c : 02/18/2015 Notes: Orientation to the Wound Care Program Nursing Diagnoses: Knowledge deficit related to the wound healing center program Goals: Patient/caregiver will verbalize understanding of the Wound Healing Center Program Date Initiated: 02/11/2015 Goal Status: Active Interventions: Provide education on orientation to the wound center Notes: Wound/Skin Impairment Nursing Diagnoses: Impaired tissue integrity Knowledge deficit related to ulceration/compromised skin integrity Goals: Patient/caregiver will verbalize understanding of skin care regimen Date  Initiated: 02/11/2015 Goal Status: Active Ulcer/skin breakdown will heal within 14 weeks Date Initiated: 02/11/2015 [name] NationsSILETZKY, Kalynne ([id number]) Goal Status: Active Interventions: Assess patient/caregiver ability to obtain necessary supplies Assess patient/caregiver ability to perform ulcer/skin care regimen upon admission and as needed Assess ulceration(s) every visit Provide education on ulcer and skin care Treatment Activities: Skin care regimen initiated : 02/18/2015 Topical wound management initiated : 02/18/2015 Notes: Electronic Signature(s) Signed: 02/18/2015 3:19:03 PM By: [name] HarderMabry, Kelsey RN Entered By: [name] on 02/18/2015 11:17:51 [name] NationsSILETZKY, Kadey ([id number]) -------------------------------------------------------------------------------- Pain Assessment Details Patient Name: [name] NationsSILETZKY, Isley Date of Service: 02/18/2015 10:15 AM Medical Record Number: [id number] Patient Account Number: [id number] Date of Birth/Sex: 09/19/1921 (81 y.o. Female) Treating RN: [name] Sitesorthy, Joanna Primary Care Physician: Dorothey BasemanBRONSTEIN, DAVID Other Clinician: Referring Physician: Dorothey BasemanBRONSTEIN, DAVID Treating Physician/Extender: [name] Weeks in Treatment: 1 Active Problems Location of Pain Severity and Description of Pain Patient Has Paino Yes Site Locations Pain Location: Pain in Ulcers With Dressing Change: Yes Duration of the Pain. Constant / Intermittento Constant Character of Pain Describe the Pain: Aching Pain Management and Medication Current Pain Management: Electronic Signature(s) Signed: 02/18/2015 3:12:27 PM By: [name] Sitesorthy, Joanna Entered By: [name] Sitesorthy, Joanna on 02/18/2015 10:53:36 [name] NationsSILETZKY, Dakotah ([id number]) -------------------------------------------------------------------------------- Patient/Caregiver Education Details Patient Name: [name] NationsSILETZKY, Maytte Date of Service: 02/18/2015 10:15 AM Medical Record Number: [id number] Patient Account Number: [id number] Date of Birth/Gender: 07/20/1921  (81 y.o. Female) Treating RN: [name] Primary Care Physician: Dorothey BasemanBRONSTEIN, DAVID Other Clinician: Referring Physician: Dorothey BasemanBRONSTEIN, DAVID Treating Physician/Extender: [name] Weeks in  Treatment: 1 Education Assessment Education Provided To: Patient Education Topics Provided Safety: Methods: Explain/Verbal Responses: State content correctly Wound Debridement: Methods: Explain/Verbal Responses: State content correctly Wound/Skin Impairment: Methods: Explain/Verbal Responses: State content correctly Electronic Signature(s) Signed: 02/18/2015 3:19:03 PM By: [name] Harder RN Entered By: [name] Harder on 02/18/2015 13:20:11 [name] ([id number]) -------------------------------------------------------------------------------- Wound Assessment Details Patient Name: [name] Date of Service: 02/18/2015 10:15 AM Medical Record Number: [id number] Patient Account Number: [id number] Date of Birth/Sex: Aug 26, 1921 (81 y.o. 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 Wound Open Wounding Event: Gradually Appeared Status: Date Acquired: 11/04/2014 Comorbid Arrhythmia, Deep Vein Thrombosis, Weeks Of Treatment: 1 History: Peripheral Arterial Disease, Type II Clustered Wound: No Diabetes Photos Photo Uploaded By: [name] on 02/18/2015 15:03:24 Wound Measurements Length: (cm) 2.1 Width: (cm) 3.3 Depth: (cm) 0.1 Area: (cm) 5.443 Volume: (cm) 0.544 % Reduction in Area: 1% % Reduction in Volume: 1.1% Epithelialization: None Tunneling: No Undermining: No Wound Description Full Thickness Without Classification: Exposed Support Structures Diabetic Severity Grade 1 (Wagner): Wound Margin: Flat  and Intact Exudate Amount: Large Exudate Type: Purulent Exudate Color: yellow, brown, green Foul Odor After Cleansing: No Wound Bed Granulation Amount: None Present (0%) Exposed Structure Merced, Ceasia ([id number]) Necrotic Amount: Large (67-100%) Fascia Exposed: No Necrotic Quality: Eschar, Adherent Slough Fat Layer Exposed: No Tendon Exposed: No Muscle Exposed: No Joint Exposed: No Bone Exposed: No Limited to Skin Breakdown Periwound Skin Texture Texture Color No Abnormalities Noted: No No Abnormalities Noted: No Callus: No Atrophie Blanche: No Crepitus: No Cyanosis: No Excoriation: No Ecchymosis: No Fluctuance: No Erythema: Yes Friable: No Erythema Location: Circumferential Induration: No Hemosiderin Staining: No Localized Edema: No Mottled: No Rash: No Pallor: No Scarring: No Rubor: No Moisture Temperature / Pain No Abnormalities Noted: No Temperature: No Abnormality Dry / Scaly: No Tenderness on Palpation: Yes Maceration: No Moist: No Wound Preparation Ulcer Cleansing: Rinsed/Irrigated with Saline Topical Anesthetic Applied: Other: lidocaine 4%, Electronic Signature(s) Signed: 02/18/2015 3:12:27 PM By: [name] Entered By: [name] on 02/18/2015 13:28:19 [name] ([id number]) -------------------------------------------------------------------------------- Wound Assessment Details Patient Name: [name] Date of Service: 02/18/2015 10:15 AM Medical Record Number: [id number] Patient Account Number: [id number] Date of Birth/Sex: [birth year] (81 y.o. Female) Treating RN: [name] Primary Care Physician: Dorothey Baseman Other Clinician: Referring Physician: Terance Hart, DAVID Treating Physician/Extender: [name] in Treatment: 1 Wound Status Wound Number: 2 Primary Arterial Insufficiency Ulcer Etiology: Wound Location: Right Toe Third Wound Open Wounding Event: Gradually Appeared Status: Date Acquired:  11/04/2014 Comorbid Arrhythmia, Deep Vein Thrombosis, Weeks Of Treatment: 1 History: Peripheral Arterial Disease, Type II Clustered Wound: No Diabetes Photos Photo Uploaded By: [name] on 02/18/2015 15:03:24 Wound Measurements Length: (cm) 2.3 Width: (cm) 1.2 Depth: (cm) 0.1 Area: (cm) 2.168 Volume: (cm) 0.217 % Reduction in Area: -130.1% % Reduction in Volume: -130.9% Epithelialization: None Tunneling: No Undermining: No Wound Description Full Thickness Without Foul Odor Aft Classification: Exposed Support Structures Diabetic Severity Grade 1 (Wagner): Wound Margin: Flat and Intact Exudate Amount: Small Exudate Type: Purulent Exudate Color: yellow, brown, green er Cleansing: No Wound Bed Granulation Amount: None Present (0%) Exposed Structure Cake, Blakely ([id number]) Necrotic Amount: Large (67-100%) Fascia Exposed: No Necrotic Quality: Eschar, Adherent Slough Fat Layer Exposed: No Tendon Exposed: No Muscle Exposed: No Joint Exposed: No Bone Exposed: No Limited to Skin Breakdown Periwound Skin  Texture Texture Color No Abnormalities Noted: No No Abnormalities Noted: No Callus: No Atrophie Blanche: No Crepitus: No Cyanosis: No Excoriation: No Ecchymosis: No Fluctuance: No Erythema: Yes Friable: No Erythema Location: Circumferential Induration: No Hemosiderin Staining: No Localized Edema: No Mottled: No Rash: No Pallor: No Scarring: No Rubor: No Moisture Temperature / Pain No Abnormalities Noted: No Temperature: No Abnormality Dry / Scaly: No Tenderness on Palpation: Yes Maceration: No Moist: No Wound Preparation Ulcer Cleansing: Rinsed/Irrigated with Saline Topical Anesthetic Applied: Other: lidocaine 4%, Electronic Signature(s) Signed: 02/18/2015 3:12:27 PM By: [name] Entered By: [name] on 02/18/2015 13:28:31 [name]  ([id number]) -------------------------------------------------------------------------------- Wound Assessment Details Patient Name: [name] Date of Service: 02/18/2015 10:15 AM Medical Record Number: [id number] Patient Account Number: [id number] Date of Birth/Sex: [birth year] (81 y.o. Female) Treating RN: [name] Primary Care Physician: Dorothey Baseman Other Clinician: Referring Physician: Terance Hart, DAVID Treating Physician/Extender: [name] in Treatment: 1 Wound Status Wound Number: 3 Primary Dehisced Wound Etiology: Wound Location: Left Amputation Site - Below Knee Wound Open Status: Wounding Event: Surgical Injury Comorbid Arrhythmia, Deep Vein Thrombosis, Date Acquired: 01/03/2015 History: Peripheral Arterial Disease, Type II Weeks Of Treatment: 0 Diabetes Clustered Wound: No Photos Photo Uploaded By: [name] on 02/18/2015 15:03:59 Wound Measurements Length: (cm) 2.2 Width: (cm) 6 Depth: (cm) 0.4 Area: (cm) 10.367 Volume: (cm) 4.147 % Reduction in Area: 0% % Reduction in Volume: 0% Epithelialization: None Tunneling: No Undermining: No Wound Description Full Thickness Without Classification: Exposed Support Structures Diabetic Severity Grade 2 (Wagner): Wound Margin: Distinct, outline attached Exudate Amount: Large Exudate Type: Serous Exudate Color: amber Foul Odor After Cleansing: No Wound Bed Granulation Amount: None Present (0%) Exposed Structure Ragain, Kewanda ([id number]) Necrotic Amount: Large (67-100%) Fascia Exposed: No Necrotic Quality: Eschar, Adherent Slough Fat Layer Exposed: No Tendon Exposed: No Muscle Exposed: No Joint Exposed: No Bone Exposed: No Limited to Skin Breakdown Periwound Skin Texture Texture Color No Abnormalities Noted: No No Abnormalities Noted: No Callus: No Atrophie Blanche: No Crepitus: No Cyanosis: No Excoriation: No Ecchymosis: No Fluctuance: No Erythema: No Friable:  No Hemosiderin Staining: No Induration: No Mottled: No Localized Edema: No Pallor: No Rash: No Rubor: No Scarring: No Temperature / Pain Moisture Temperature: No Abnormality No Abnormalities Noted: No Tenderness on Palpation: Yes Dry / Scaly: No Maceration: No Moist: No Wound Preparation Ulcer Cleansing: Rinsed/Irrigated with Saline Topical Anesthetic Applied: Other: lidocaine 4%, Electronic Signature(s) Signed: 02/18/2015 3:12:27 PM By: [name] Entered By: [name] on 02/18/2015 13:29:19 [name] ([id number]) -------------------------------------------------------------------------------- Vitals Details Patient Name: [name] Date of Service: 02/18/2015 10:15 AM Medical Record Number: [id number] Patient Account Number: [id number] Date of Birth/Sex: 1920-12-16 (81 y.o. Female) Treating RN: [name] Primary Care Physician: Dorothey Baseman Other Clinician: Referring Physician: Terance Hart, DAVID Treating Physician/Extender: [name] in Treatment: 1 Vital Signs Time Taken: 10:52 Temperature (F): 98.3 Height (in): 62 Pulse (bpm): 58 Weight (lbs): 155 Respiratory Rate (breaths/min): 18 Body Mass Index (BMI): 28.3 Blood Pressure (mmHg): 115/48 Reference Range: 80 - 120 mg / dl Electronic Signature(s) Signed: 02/18/2015 3:12:27 PM By: [name] Entered By: [name] on 02/18/2015 10:53:57

## 2015-02-25 ENCOUNTER — Encounter: Payer: Medicare Other | Admitting: Surgery

## 2015-02-25 DIAGNOSIS — L97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: Secondary | ICD-10-CM | POA: Diagnosis not present

## 2015-02-25 NOTE — Progress Notes (Addendum)
KEYARAH, MCROY ([id number]) Visit Report for 02/25/2015 Chief Complaint Document Details Patient Name: [name], [name] Date of Service: 02/25/2015 10:15 AM Medical Record Number: [id number] Patient Account Number: [id number] Date of Birth/Sex: 1921/04/12 (81 y.o. Female) Treating RN: Primary Care Physician: Terance Hart, DAVID Other Clinician: Referring Physician: Terance Hart, DAVID Treating Physician/Extender: Rudene Re in Treatment: 2 Information Obtained from: Patient Chief Complaint Patient presents to the wound care center for a consult due non healing wound. 81 year old patient with comes with a history of a ulcerated area to the right third toe and the right heel which she's had for about 2 months. Electronic Signature(s) Signed: 02/25/2015 1:06:57 PM By: Evlyn Kanner MD, FACS Entered By: Evlyn Kanner on 02/25/2015 11:11:16 [name] ([id number]) -------------------------------------------------------------------------------- Debridement Details Patient Name: [name] Date of Service: 02/25/2015 10:15 AM Medical Record Number: [id number] Patient Account Number: [id number] Date of Birth/Sex: 1921/08/25 (81 y.o. Female) Treating RN: Primary Care Physician: Terance Hart, DAVID Other Clinician: Referring Physician: Terance Hart, DAVID Treating Physician/Extender: Rudene Re in Treatment: 2 Debridement Performed for Wound #1 Right Calcaneous Assessment: Performed By: Physician Tristan Schroeder., MD Debridement: Debridement Pre-procedure Yes Verification/Time Out Taken: Start Time: 11:02 Pain Control: Lidocaine 4% Topical Solution Level: Skin/Subcutaneous Tissue Total Area Debrided (L x 2 (cm) x 2.5 (cm) = 5 (cm) W): Tissue and other Viable, Non-Viable, Eschar, Fat, Fibrin/Slough, Subcutaneous material debrided: Instrument: Forceps, Scissors Bleeding: Minimum Hemostasis Achieved: Pressure End Time: 11:05 Procedural Pain: 3 Post Procedural Pain:  0 Response to Treatment: Procedure was tolerated well Post Debridement Measurements of Total Wound Length: (cm) 2 Width: (cm) 2.5 Depth: (cm) 0.2 Volume: (cm) 0.785 Electronic Signature(s) Signed: 02/25/2015 1:06:57 PM By: Evlyn Kanner MD, FACS Entered By: Evlyn Kanner on 02/25/2015 11:09:52 [name] ([id number]) -------------------------------------------------------------------------------- Debridement Details Patient Name: [name] Date of Service: 02/25/2015 10:15 AM Medical Record Number: [id number] Patient Account Number: [id number] Date of Birth/Sex: [birth year] (81 y.o. Female) Treating RN: Primary Care Physician: Terance Hart, DAVID Other Clinician: Referring Physician: Terance Hart, DAVID Treating Physician/Extender: Rudene Re in Treatment: 2 Debridement Performed for Wound #2 Right Toe Third Assessment: Performed By: Physician Tristan Schroeder., MD Debridement: Open Wound/Selective Debridement Selective Description: Pre-procedure Yes Verification/Time Out Taken: Start Time: 11:01 Pain Control: Lidocaine 4% Topical Solution Level: Non-Viable Tissue Total Area Debrided (L x 2.1 (cm) x 1.1 (cm) = 2.31 (cm) W): Tissue and other Non-Viable, Fibrin/Slough, Other material debrided: Instrument: Forceps, Scissors Bleeding: Minimum Hemostasis Achieved: Pressure End Time: 11:01 Procedural Pain: 0 Post Procedural Pain: 0 Response to Treatment: Procedure was tolerated well Post Debridement Measurements of Total Wound Length: (cm) 2.1 Width: (cm) 1.1 Depth: (cm) 0.1 Volume: (cm) 0.181 Notes nail on the right third toe was removed. Electronic Signature(s) Signed: 02/25/2015 1:06:57 PM By: Evlyn Kanner MD, FACS Entered By: Evlyn Kanner on 02/25/2015 11:10:47 [name] ([id number]) -------------------------------------------------------------------------------- Debridement Details Patient Name: [name] Date of Service: 02/25/2015  10:15 AM Medical Record Number: [id number] Patient Account Number: [id number] Date of Birth/Sex: June 28, 1921 (81 y.o. Female) Treating RN: Primary Care Physician: Terance Hart, DAVID Other Clinician: Referring Physician: Terance Hart, DAVID Treating Physician/Extender: Rudene Re in Treatment: 2 Debridement Performed for Wound #3 Left Amputation Site - Below Knee Assessment: Performed By: Physician Tristan Schroeder., MD Debridement: Debridement Pre-procedure Yes Verification/Time Out Taken: Start Time: 10:57 Pain Control: Lidocaine 4% Topical Solution Level: Skin/Subcutaneous Tissue Total Area Debrided (L x 1.9 (cm) x 5.9 (cm) = 11.21 (cm) W): Tissue and other Non-Viable, Eschar, Fat, Fibrin/Slough, Subcutaneous material debrided: Instrument: Forceps, Scissors Bleeding: Minimum Hemostasis Achieved:  Pressure End Time: 11:01 Procedural Pain: 0 Post Procedural Pain: 0 Response to Treatment: Procedure was tolerated well Post Debridement Measurements of Total Wound Length: (cm) 1.9 Width: (cm) 5.9 Depth: (cm) 1 Volume: (cm) 8.804 Electronic Signature(s) Signed: 02/25/2015 1:06:57 PM By: Evlyn Kanner MD, FACS Entered By: Evlyn Kanner on 02/25/2015 11:11:05 [name] ([id number]) -------------------------------------------------------------------------------- HPI Details Patient Name: [name] Date of Service: 02/25/2015 10:15 AM Medical Record Number: [id number] Patient Account Number: [id number] Date of Birth/Sex: January 23, 1921 (81 y.o. Female) Treating RN: Primary Care Physician: Terance Hart, DAVID Other Clinician: Referring Physician: Terance Hart, DAVID Treating Physician/Extender: Rudene Re in Treatment: 2 History of Present Illness Location: right medial heel and right third toe Quality: Patient reports experiencing a dull pain to affected area(s). Severity: Patient states wound are getting worse. Duration: Patient has had the wound for > 3 months  prior to seeking treatment at the wound center Timing: Pain in wound is Intermittent (comes and goes Context: The wound appeared gradually over time Modifying Factors: Consults to this date include:vascular surgeon and several recent surgeries. Associated Signs and Symptoms: Patient reports having foul odor and drainage from the left below-knee amputation site. HPI Description: A pleasant 81 year old patient who is known to have diabetes mellitus for several years has recently gone through a series of operations with the vascular surgeon Dr. Gilda Crease. In January and February she had several surgeries on the left lower extremity with an attempt to have limb salvage for gangrenous changes of her forefoot. 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 On 01/07/2015 she also had a right lower extremity distal runoff with a angioplasty of the right anterior tibial artery to maximize her blood flow to the foot. She recently had her staples removed and Dr. Elzie Rings office on 01/31/2015 and at that time a right lower extremity duplex was done which showed patent vessels and a patent stent with distal occluded posterior tibial artery. Though the duplex was noncritical the recommendations from the PA at the vascular surgery office was that of a angiogram to be done but the patient said she would rather try some bone care before. The patient has received doxycycline and Cipro in the recent past and she takes oral medications for her diabetes. Other than that I reviewed her list of all her medications. No recent hemoglobin A1c has been done and no recent x-rays of the right foot have been done. 02/18/2015 -- x-ray of the right foot was done on 02/11/2015 and it shows no evidence of acute osteomyelitis of the third toe or of the calcaneus. She had gone to the vascular surgery office and they had noted that there is dehiscence of the left  part of the amputation site of the below-knee wound and they have asked [place] to kindly take over the care of this. There've been doing dressings for the right heel and right third toe. 02/25/2015 -- we have received notes from the vascular group who saw her last on 02/13/2015 and she was seen by the PA Ms. Cleda Daub. She had recommended that the patient continue to follow with [place] for wound care including management of the left BKA stump, which has had dehiscence of the lateral part. They will consider repeating a arterial duplex or angiogram if the right lower extremity does not heal within a reasonable period of time. Electronic Signature(s) Signed: 02/25/2015 1:06:57 PM By: Evlyn Kanner MD, FACS [name], [name] ([id number]) Entered By: Evlyn Kanner  on 02/25/2015 11:11:23 [name], [name] ([id number]) -------------------------------------------------------------------------------- Physical Exam Details Patient Name: [name], [name] Date of Service: 02/25/2015 10:15 AM Medical Record Number: [id number] Patient Account Number: [id number] Date of Birth/Sex: 06/07/1921 (81 y.o. Female) Treating RN: Primary Care Physician: Terance Hart, DAVID Other Clinician: Referring Physician: Terance Hart, DAVID Treating Physician/Extender: Rudene Re in Treatment: 2 Constitutional . Pulse regular. Respirations normal and unlabored. Afebrile. . Eyes Nonicteric. Reactive to light. Ears, Nose, Mouth, and Throat Lips, teeth, and gums WNL.Marland Kitchen Moist mucosa without lesions . Neck supple and nontender. No palpable supraclavicular or cervical adenopathy. Normal sized without goiter. Respiratory WNL. No retractions.. Cardiovascular Pedal Pulses weakly palpable right lower extremity.Marland Kitchen enemas pedal edema.. Integumentary (Hair, Skin) several of the areas had slight slough and debris in the subcutaneous tissue and this was sharply debrided with forceps and scissors.. . Psychiatric Judgement and insight  Intact.. No evidence of depression, anxiety, or agitation.. Electronic Signature(s) Signed: 02/25/2015 1:06:57 PM By: Evlyn Kanner MD, FACS Entered By: Evlyn Kanner on 02/25/2015 11:12:39 [name] ([id number]) -------------------------------------------------------------------------------- Physician Orders Details Patient Name: [name] Date of Service: 02/25/2015 10:15 AM Medical Record Number: [id number] Patient Account Number: [id number] Date of Birth/Sex: 1920/12/01 (81 y.o. Female) Treating RN: Renee Harder Primary Care Physician: Dorothey Baseman Other Clinician: Referring Physician: Dorothey Baseman Treating Physician/Extender: Rudene Re in Treatment: 2 Verbal / Phone Orders: Yes Clinician: Renee Harder Read Back and Verified: Yes Diagnosis Coding Wound Cleansing Wound #1 Right Calcaneous o Clean wound with Normal Saline. o Clean wound with Normal Saline. Wound #2 Right Toe Third o Clean wound with Normal Saline. o Clean wound with Normal Saline. Wound #3 Left Amputation Site - Below Knee o Clean wound with Normal Saline. o Clean wound with Normal Saline. Anesthetic Wound #1 Right Calcaneous o Topical Lidocaine 4% cream applied to wound bed prior to debridement Wound #2 Right Toe Third o Topical Lidocaine 4% cream applied to wound bed prior to debridement Wound #3 Left Amputation Site - Below Knee o Topical Lidocaine 4% cream applied to wound bed prior to debridement Primary Wound Dressing Wound #1 Right Calcaneous o Santyl Ointment Wound #2 Right Toe Third o Santyl Ointment Wound #3 Left Amputation Site - Below Knee o Santyl Ointment Secondary Dressing Wound #1 Right Calcaneous o Gauze and Kerlix/Conform List, Ralene ([id number]) Wound #2 Right Toe Third o Gauze and Kerlix/Conform Wound #3 Left Amputation Site - Below Knee o Gauze and Kerlix/Conform Dressing Change Frequency Wound #1 Right  Calcaneous o Change dressing every day. Wound #2 Right Toe Third o Change dressing every day. Wound #3 Left Amputation Site - Below Knee o Change dressing every day. Follow-up Appointments Wound #1 Right Calcaneous o Return Appointment in 1 week. Wound #2 Right Toe Third o Return Appointment in 1 week. Wound #3 Left Amputation Site - Below Knee o Return Appointment in 1 week. Off-Loading Wound #1 Right Calcaneous o Open toe surgical shoe to: - right foot o Other: - Offloading boot at night right foot Wound #2 Right Toe Third o Open toe surgical shoe to: - right foot o Other: - Offloading boot at night right foot Wound #3 Left Amputation Site - Below Knee o Open toe surgical shoe to: - right foot o Other: - Offloading boot at night right foot Home Health Wound #1 Right Calcaneous o Initiate Home Health for Skilled Nursing - Continue Tomasa Hosteller Continue Home Health Visits o Home Health Nurse may visit PRN to address patientos wound care needs. o FACE TO FACE ENCOUNTER: MEDICARE and  MEDICAID PATIENTS: I certify that this patient is under my care and that I had a face-to-face encounter that meets the physician face-to-face encounter requirements with this patient on this date. The encounter with the patient was in [place], [place] ([id number]) whole or in part for the following MEDICAL CONDITION: (primary reason for Home Healthcare) MEDICAL NECESSITY: I certify, that based on my findings, NURSING services are a medically necessary home health service. HOME BOUND STATUS: I certify that my clinical findings support that this patient is homebound (i.e., Due to illness or injury, pt requires aid of supportive devices such as crutches, cane, wheelchairs, walkers, the use of special transportation or the assistance of another person to leave their place of residence. There is a normal inability to leave the home and doing so requires considerable and taxing  effort. Other absences are for medical reasons / religious services and are infrequent or of short duration when for other reasons). o If current dressing causes regression in wound condition, may D/C ordered dressing product/s and apply Normal Saline Moist Dressing daily until next Wound Healing Center / Other MD appointment. Notify Wound Healing Center of regression in wound condition at [phone]. o Please direct any NON-WOUND related issues/requests for orders to patient's Primary Care Physician Wound #2 Right Toe Third o Initiate Home Health for Skilled Nursing - Continue Tomasa Hosteller Continue Home Health Visits o Home Health Nurse may visit PRN to address patientos wound care needs. o FACE TO FACE ENCOUNTER: MEDICARE and MEDICAID PATIENTS: I certify that this patient is under my care and that I had a face-to-face encounter that meets the physician face-to-face encounter requirements with this patient on this date. The encounter with the patient was in whole or in part for the following MEDICAL CONDITION: (primary reason for Home Healthcare) MEDICAL NECESSITY: I certify, that based on my findings, NURSING services are a medically necessary home health service. HOME BOUND STATUS: I certify that my clinical findings support that this patient is homebound (i.e., Due to illness or injury, pt requires aid of supportive devices such as crutches, cane, wheelchairs, walkers, the use of special transportation or the assistance of another person to leave their place of residence. There is a normal inability to leave the home and doing so requires considerable and taxing effort. Other absences are for medical reasons / religious services and are infrequent or of short duration when for other reasons). o If current dressing causes regression in wound condition, may D/C ordered dressing product/s and apply Normal Saline Moist Dressing daily until next Wound Healing Center / Other  MD appointment. Notify Wound Healing Center of regression in wound condition at [phone]. o Please direct any NON-WOUND related issues/requests for orders to patient's Primary Care Physician Wound #3 Left Amputation Site - Below Knee o Initiate Home Health for Skilled Nursing - Continue Tomasa Hosteller Continue Home Health Visits o Home Health Nurse may visit PRN to address patientos wound care needs. o FACE TO FACE ENCOUNTER: MEDICARE and MEDICAID PATIENTS: I certify that this patient is under my care and that I had a face-to-face encounter that meets the physician face-to-face encounter requirements with this patient on this date. The encounter with the patient was in whole or in part for the following MEDICAL CONDITION: (primary reason for Home Healthcare) MEDICAL NECESSITY: I certify, that based on my findings, NURSING services are a medically necessary home health service. HOME BOUND STATUS: I certify that my clinical findings support that this patient is homebound (i.e., Due to  illness or injury, pt requires aid of supportive devices such as crutches, cane, wheelchairs, walkers, the use of special transportation or the assistance of another person to leave their place of residence. There is a [name], [name] ([id number]) normal inability to leave the home and doing so requires considerable and taxing effort. Other absences are for medical reasons / religious services and are infrequent or of short duration when for other reasons). o If current dressing causes regression in wound condition, may D/C ordered dressing product/s and apply Normal Saline Moist Dressing daily until next Wound Healing Center / Other MD appointment. Notify Wound Healing Center of regression in wound condition at [phone]. o Please direct any NON-WOUND related issues/requests for orders to patient's Primary Care Physician Medications-please add to medication list. Wound #1 Right Calcaneous o  Santyl Enzymatic Ointment Wound #2 Right Toe Third o Santyl Enzymatic Ointment Wound #3 Left Amputation Site - Below Knee o Santyl Enzymatic Ointment Electronic Signature(s) Signed: 02/25/2015 1:06:57 PM By: Evlyn Kanner MD, FACS Signed: 02/25/2015 2:10:43 PM By: Renee Harder RN Entered By: Renee Harder on 02/25/2015 11:07:14 [name] ([id number]) -------------------------------------------------------------------------------- Problem List Details Patient Name: [name] Date of Service: 02/25/2015 10:15 AM Medical Record Number: [id number] Patient Account Number: [id number] Date of Birth/Sex: [birth year] (81 y.o. 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 L97.812 Non-pressure chronic ulcer of other part of right lower leg 02/11/2015 Yes with fat layer exposed T81.31XA Disruption of external operation (surgical) wound, not 02/18/2015 Yes elsewhere classified, initial encounter Inactive Problems Resolved Problems Electronic Signature(s) Signed: 02/25/2015 1:06:57 PM By: Evlyn Kanner MD, FACS Entered By: Evlyn Kanner on 02/25/2015 11:09:25 [name] ([id number]) -------------------------------------------------------------------------------- Progress Note Details Patient Name: [name] Date of Service: 02/25/2015 10:15 AM Medical Record Number: [id number] Patient Account Number: [id number] Date of Birth/Sex: 13-Mar-1921  (81 y.o. Female) Treating RN: Primary Care Physician: Terance Hart, DAVID Other Clinician: Referring Physician: Terance Hart, DAVID Treating Physician/Extender: Rudene Re in Treatment: 2 Subjective Chief Complaint Information obtained from Patient Patient presents to the wound care center for a consult due non healing wound. 81 year old patient with comes with a history of a ulcerated area to the right third toe and the right heel which she's had for about 2 months. History of Present Illness (HPI) The following HPI elements were documented for the patient's wound: Location: right medial heel and right third toe Quality: Patient reports experiencing a dull pain to affected area(s). Severity: Patient states wound are getting worse. Duration: Patient has had the wound for > 3 months prior to seeking treatment at the wound center Timing: Pain in wound is Intermittent (comes and goes Context: The wound appeared gradually over time Modifying Factors: Consults to this date include:vascular surgeon and several recent surgeries. Associated Signs and Symptoms: Patient reports having foul odor and drainage from the left below-knee amputation site. A pleasant 81 year old patient who is known to have diabetes mellitus for several years has recently gone through a series of operations with the vascular surgeon Dr. Gilda Crease. In January and February she had several surgeries on the left lower  extremity with an attempt to have limb salvage for gangrenous changes of her forefoot. 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 On 01/07/2015 she also had a right lower extremity distal runoff with a angioplasty of the right anterior tibial artery to maximize her blood flow to the foot. She recently had her staples removed and Dr. Elzie Rings office on 01/31/2015 and at that time a right lower extremity duplex was done which showed  patent vessels and a patent stent with distal occluded posterior tibial artery. Though the duplex was noncritical the recommendations from the PA at the vascular surgery office was that of a angiogram to be done but the patient said she would rather try some bone care before. The patient has received doxycycline and Cipro in the recent past and she takes oral medications for her diabetes. Other than that I reviewed her list of all her medications. No recent hemoglobin A1c has been done and no recent x-rays of the right foot have been done. 02/18/2015 -- x-ray of the right foot was done on 02/11/2015 and it shows no evidence of acute osteomyelitis of the third toe or of the calcaneus. She had gone to the vascular surgery office and they had noted that there is dehiscence of the left part of the amputation site of the below-knee wound and they have asked [place] to kindly take over the care of this. There've been doing dressings for the right heel and right third toe. [name], [name] ([id number]) 02/25/2015 -- we have received notes from the vascular group who saw her last on 02/13/2015 and she was seen by the PA Ms. Cleda Daub. She had recommended that the patient continue to follow with [place] for wound care including management of the left BKA stump, which has had dehiscence of the lateral part. They will consider repeating a arterial duplex or angiogram if the right lower extremity does not heal within a reasonable period of time. Objective Constitutional Pulse regular. Respirations normal and unlabored. Afebrile. Vitals Time Taken: 10:33 AM, Height: 62 in, Weight: 155 lbs, BMI: 28.3, Temperature: 98.1 F, Pulse: 62 bpm, Respiratory Rate: 18 breaths/min, Blood Pressure: 132/52 mmHg. Eyes Nonicteric. Reactive to light. Ears, Nose, Mouth, and Throat Lips, teeth, and gums WNL.Marland Kitchen Moist mucosa without lesions . Neck supple and nontender. No palpable supraclavicular or cervical adenopathy.  Normal sized without goiter. Respiratory WNL. No retractions.. Cardiovascular Pedal Pulses weakly palpable right lower extremity.Marland Kitchen enemas pedal edema.Marland Kitchen Psychiatric Judgement and insight Intact.. No evidence of depression, anxiety, or agitation.. Integumentary (Hair, Skin) several of the areas had slight slough and debris in the subcutaneous tissue and this was sharply debrided with forceps and scissors.. Wound #1 status is Open. Original cause of wound was Gradually Appeared. The wound is located on the Right Calcaneous. The wound measures 2cm length x 2.5cm width x 0.1cm depth; 3.927cm^2 area and 0.393cm^3 volume. The wound is limited to skin breakdown. There is no tunneling or undermining noted. There is a large amount of purulent drainage noted. The wound margin is flat and intact. There is no granulation within the wound bed. There is a large (67-100%) amount of necrotic tissue within the wound [name], [name] ([id number]) bed including Eschar and Adherent Slough. The periwound skin appearance exhibited: Erythema. The periwound skin appearance did not exhibit: Callus, Crepitus, Excoriation, Fluctuance, Friable, Induration, Localized Edema, Rash, Scarring, Dry/Scaly, Maceration, Moist, Atrophie Blanche, Cyanosis, Ecchymosis, Hemosiderin Staining, Mottled, Pallor, Rubor. The surrounding wound  skin color is noted with erythema which is circumferential. Periwound temperature was noted as No Abnormality. The periwound has tenderness on palpation. Wound #2 status is Open. Original cause of wound was Gradually Appeared. The wound is located on the Right Toe Third. The wound measures 2.1cm length x 1.1cm width x 0.1cm depth; 1.814cm^2 area and 0.181cm^3 volume. The wound is limited to skin breakdown. There is no tunneling or undermining noted. There is a small amount of purulent drainage noted. The wound margin is flat and intact. There is no granulation within the wound bed. There is a large  (67-100%) amount of necrotic tissue within the wound bed including Eschar and Adherent Slough. The periwound skin appearance exhibited: Erythema. The periwound skin appearance did not exhibit: Callus, Crepitus, Excoriation, Fluctuance, Friable, Induration, Localized Edema, Rash, Scarring, Dry/Scaly, Maceration, Moist, Atrophie Blanche, Cyanosis, Ecchymosis, Hemosiderin Staining, Mottled, Pallor, Rubor. The surrounding wound skin color is noted with erythema which is circumferential. Periwound temperature was noted as No Abnormality. The periwound has tenderness on palpation. Wound #3 status is Open. Original cause of wound was Surgical Injury. The wound is located on the Left Amputation Site - Below Knee. The wound measures 1.9cm length x 5.9cm width x 0.3cm depth; 8.804cm^2 area and 2.641cm^3 volume. The wound is limited to skin breakdown. There is no tunneling or undermining noted. There is a large amount of serous drainage noted. The wound margin is distinct with the outline attached to the wound base. There is no granulation within the wound bed. There is a large (67- 100%) amount of necrotic tissue within the wound bed including Eschar and Adherent Slough. The periwound skin appearance did not exhibit: Callus, Crepitus, Excoriation, Fluctuance, Friable, Induration, Localized Edema, Rash, Scarring, Dry/Scaly, Maceration, Moist, Atrophie Blanche, Cyanosis, Ecchymosis, Hemosiderin Staining, Mottled, Pallor, Rubor, Erythema. Periwound temperature was noted as No Abnormality. The periwound has tenderness on palpation. 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 We will also debride each time she comes and hopefully continue to make good progress. As noted in the vascular note if the wound fails to heal with a reasonable period of time we will then consider arterial duplex studies of the right lower extremity. She will come back and see me next week. 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 There was a Skin/Subcutaneous Tissue Debridement ([id number]([id number]) debridement with total area of 5 sq cm performed by Quiara Killian, Ignacia FellingErrol J., MD. with the following instrument(s): Forceps and Scissors to remove Viable and Non- Viable tissue/material including Fat, Fibrin/Slough, Eschar, and Subcutaneous after achieving pain control using Lidocaine 4% Topical Solution. A time out was conducted prior to the start of the procedure. A Minimum amount of bleeding was controlled with Pressure. The procedure was tolerated well with a pain level of 3 throughout and a pain level of 0 following the procedure. Post Debridement Measurements: 2cm length x 2.5cm width x 0.2cm depth; 0.785cm^3  volume. 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 There was a Non-Viable Tissue Open Wound/Selective [id number]) debridement with total area of 2.31 sq cm performed by Natacha Jepsen, Ignacia Felling., MD. with the following instrument(s): Forceps and Scissors to remove Non-Viable tissue/material including Fibrin/Slough and Other after achieving pain control using Lidocaine 4% Topical Solution. A time out was conducted prior to the start of the procedure. A Minimum amount of bleeding was controlled with Pressure. The procedure was tolerated  well with a pain level of 0 throughout and a pain level of 0 following the procedure. Post Debridement Measurements: 2.1cm length x 1.1cm width x 0.1cm depth; 0.181cm^3 volume. General Notes: nail on the right third toe was removed.. Wound #3 Wound #3 is a Dehisced Wound located on the Left Amputation Site - Below Knee . There was a Skin/Subcutaneous Tissue Debridement ([id number]) debridement with total area of 11.21 sq cm performed by Mabry Tift, Ignacia Felling., MD. with the following instrument(s): Forceps and Scissors to remove Non- Viable tissue/material including Fat, Fibrin/Slough, Eschar, and Subcutaneous after achieving pain control using Lidocaine 4% Topical Solution. A time out was conducted prior to the start of the procedure. A Minimum amount of bleeding was controlled with Pressure. The procedure was tolerated well with a pain level of 0 throughout and a pain level of 0 following the procedure. Post Debridement Measurements: 1.9cm length x 5.9cm width x 1cm depth; 8.804cm^3 volume. Plan Wound Cleansing: Wound #1 Right Calcaneous: Clean wound with Normal Saline. Borman, Gracyn ([id number]) Clean wound with Normal Saline. Wound #2 Right Toe Third: Clean wound with Normal Saline. Clean wound with Normal Saline. Wound #3 Left Amputation Site - Below Knee: Clean wound with Normal Saline. Clean wound with Normal Saline. Anesthetic: Wound #1 Right Calcaneous: Topical Lidocaine 4% cream applied to wound bed prior to debridement Wound #2 Right Toe Third: Topical Lidocaine 4% cream applied to wound bed prior to debridement Wound #3 Left Amputation Site - Below Knee: Topical Lidocaine 4% cream applied to wound bed prior to debridement Primary Wound Dressing: Wound #1 Right Calcaneous: Santyl Ointment Wound #2 Right Toe Third: Santyl Ointment Wound #3 Left Amputation Site - Below Knee: Santyl Ointment Secondary Dressing: Wound #1 Right Calcaneous: Gauze and Kerlix/Conform Wound #2  Right Toe Third: Gauze and Kerlix/Conform Wound #3 Left Amputation Site - Below Knee: Gauze and Kerlix/Conform Dressing Change Frequency: Wound #1 Right Calcaneous: Change dressing every day. Wound #2 Right Toe Third: Change dressing every day. Wound #3 Left Amputation Site - Below Knee: Change dressing every day. Follow-up Appointments: Wound #1 Right Calcaneous: Return Appointment in 1 week. Wound #2 Right Toe Third: Return Appointment in 1 week. 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 Off-Loading: Wound #1 Right Calcaneous: Open toe surgical shoe to: - right foot Other: - Offloading boot at night right foot Wound #2 Right Toe Third: Open toe surgical shoe to: - right foot Other: - Offloading boot at night right foot Wound #3 Left Amputation Site - Below Knee: Open toe surgical shoe to: - right foot [name], [name] ([id number]) Other: - Offloading boot at night right foot Home Health: Wound #1 Right Calcaneous: Initiate Home Health for Skilled Nursing - Continue [facility] Continue Home Health Visits Home Health Nurse may visit PRN to address patient s wound care needs. FACE TO FACE ENCOUNTER: MEDICARE and MEDICAID PATIENTS: I certify that this patient is under my care and that I had a face-to-face encounter  that meets the physician face-to-face encounter requirements with this patient on this date. The encounter with the patient was in whole or in part for the following MEDICAL CONDITION: (primary reason for Home Healthcare) MEDICAL NECESSITY: I certify, that based on my findings, NURSING services are a medically necessary home health service. HOME BOUND STATUS: I certify that my clinical findings support that this patient is homebound (i.e., Due to illness or injury, pt requires aid of supportive devices such as crutches, cane, wheelchairs, walkers, the use of special transportation or the assistance of another person to leave their place of  residence. There is a normal inability to leave the home and doing so requires considerable and taxing effort. Other absences are for medical reasons / religious services and are infrequent or of short duration when for other reasons). If current dressing causes regression in wound condition, may D/C ordered dressing product/s and apply Normal Saline Moist Dressing daily until next Wound Healing Center / Other MD appointment. Notify Wound Healing Center of regression in wound condition at [phone]. Please direct any NON-WOUND related issues/requests for orders to patient's Primary Care Physician Wound #2 Right Toe Third: Initiate Home Health for Skilled Nursing - Continue [facility] Continue Home Health Visits Home Health Nurse may visit PRN to address patient s wound care needs. FACE TO FACE ENCOUNTER: MEDICARE and MEDICAID PATIENTS: I certify that this patient is under my care and that I had a face-to-face encounter that meets the physician face-to-face encounter requirements with this patient on this date. The encounter with the patient was in whole or in part for the following MEDICAL CONDITION: (primary reason for Home Healthcare) MEDICAL NECESSITY: I certify, that based on my findings, NURSING services are a medically necessary home health service. HOME BOUND STATUS: I certify that my clinical findings support that this patient is homebound (i.e., Due to illness or injury, pt requires aid of supportive devices such as crutches, cane, wheelchairs, walkers, the use of special transportation or the assistance of another person to leave their place of residence. There is a normal inability to leave the home and doing so requires considerable and taxing effort. Other absences are for medical reasons / religious services and are infrequent or of short duration when for other reasons). If current dressing causes regression in wound condition, may D/C ordered dressing product/s and apply Normal  Saline Moist Dressing daily until next Wound Healing Center / Other MD appointment. Notify Wound Healing Center of regression in wound condition at [phone]. Please direct any NON-WOUND related issues/requests for orders to patient's Primary Care Physician Wound #3 Left Amputation Site - Below Knee: Initiate Home Health for Skilled Nursing - Continue [facility] Continue Home Health Visits Home Health Nurse may visit PRN to address patient s wound care needs. FACE TO FACE ENCOUNTER: MEDICARE and MEDICAID PATIENTS: I certify that this patient is under my care and that I had a face-to-face encounter that meets the physician face-to-face encounter requirements with this patient on this date. The encounter with the patient was in whole or in part for the following MEDICAL CONDITION: (primary reason for Home Healthcare) MEDICAL NECESSITY: I certify, that based on my findings, NURSING services are a medically necessary home health service. HOME BOUND STATUS: I certify that my clinical findings support that this patient is homebound (i.e., Due to illness or injury, pt requires aid of supportive devices such as crutches, cane, wheelchairs, walkers, the use of special transportation or the assistance of another person to leave their place of  residence. There is a normal inability to leave the home and doing so requires considerable and taxing effort. Other absences are [name], [name] ([id number]) for medical reasons / religious services and are infrequent or of short duration when for other reasons). If current dressing causes regression in wound condition, may D/C ordered dressing product/s and apply Normal Saline Moist Dressing daily until next Wound Healing Center / Other MD appointment. Notify Wound Healing Center of regression in wound condition at [phone]. Please direct any NON-WOUND related issues/requests for orders to patient's Primary Care Physician Medications-please add to medication  list.: Wound #1 Right Calcaneous: Santyl Enzymatic Ointment Wound #2 Right Toe Third: Santyl Enzymatic Ointment Wound #3 Left Amputation Site - Below Knee: Santyl Enzymatic Ointment As discussed with the patient and her granddaughter we will continue to use Santyl over the open wound areas. We will also debride each time she comes and hopefully continue to make good progress. As noted in the vascular note if the wound fails to heal with a reasonable period of time we will then consider arterial duplex studies of the right lower extremity. She will come back and see me next week. Electronic Signature(s) Signed: 02/28/2015 12:18:49 PM By: Evlyn Kanner MD, FACS Previous Signature: 02/25/2015 1:06:57 PM Version By: Evlyn Kanner MD, FACS Entered By: Evlyn Kanner on 02/28/2015 12:15:29 [name] ([id number]) -------------------------------------------------------------------------------- SuperBill Details Patient Name: [name] Date of Service: 02/25/2015 Medical Record Number: [id number] Patient Account Number: [id number] Date of Birth/Sex: [birth year] (81 y.o. 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 L97.812 Non-pressure chronic ulcer of other part of right lower leg with fat layer exposed Disruption of external operation (surgical) wound, not elsewhere classified, initial T81.31XA encounter Facility Procedures CPT4: Description Modifier Quantity Code [id number] 11042 - DEB SUBQ TISSUE 20 SQ CM/< 1 ICD-10 Description  Diagnosis E11.621 Type 2 diabetes mellitus with foot ulcer I70.235 Atherosclerosis of native arteries of right leg with ulceration of other part of  foot L97.812 Non-pressure chronic ulcer of other part of right lower leg with fat layer exposed CPT4: [id number] - DEBRIDE WOUND 1ST 20 SQ CM OR < 1 ICD-10 Description Diagnosis E11.621 Type 2 diabetes mellitus with foot ulcer I70.235 Atherosclerosis of native arteries of right leg with ulceration of other part of foot L97.812 Non-pressure  chronic ulcer of other part of right lower leg with fat layer exposed Physician Procedures CPT4: Description Modifier Quantity Code [id number] 11042 - WC PHYS SUBQ TISS 20 SQ CM 1 [name], [name] ([id number]) Electronic Signature(s) Signed: 02/25/2015 1:06:57 PM By: Evlyn Kanner MD, FACS Entered By: Evlyn Kanner on 02/25/2015 11:14:36

## 2015-02-25 NOTE — Progress Notes (Signed)
DEBAR, PLATE ([id number]) Visit Report for 02/25/2015 Arrival Information Details Patient Name: [name], [name] Date of Service: 02/25/2015 10:15 AM Medical Record Number: [id number] Patient Account Number: [id number] Date of Birth/Sex: 01-22-1921 (81 y.o. Female) Treating RN: [name] Primary Care Physician: [name] Other Clinician: Referring Physician: Terance [name], DAVID Treating Physician/Extender: [name] in Treatment: 2 Visit Information History Since Last Visit Added or deleted any medications: No Patient Arrived: Wheel Chair Any new allergies or adverse reactions: No Arrival Time: 10:31 Had a fall or experienced change in No Accompanied By: granddaughter activities of daily living that may affect Transfer Assistance: None risk of falls: Patient Identification Verified: Yes Signs or symptoms of abuse/neglect since last No Secondary Verification Process Yes visito Completed: Hospitalized since last visit: No Patient Has Alerts: Yes Pain Present Now: No Patient Alerts: DMII Electronic Signature(s) Signed: 02/25/2015 4:20:15 PM By: [name] Entered By: [name] on 02/25/2015 10:32:36 [name] ([id number]) -------------------------------------------------------------------------------- Encounter Discharge Information Details Patient Name: [name] Date of Service: 02/25/2015 10:15 AM Medical Record Number: [id number] Patient Account Number: [id number] Date of Birth/Sex: 01-23-1921 (81 y.o. Female) Treating RN: [name] Primary Care Physician: [name] Other Clinician: Referring Physician: Dorothey [name] Treating Physician/Extender: [name] in Treatment: 2 Encounter Discharge Information Items Discharge Pain Level: 0 Discharge Condition: Stable Ambulatory Status: Wheelchair Discharge Destination: Home Transportation: Private Auto Accompanied By: granddaughter Schedule Follow-up Appointment:  Yes Medication Reconciliation completed and provided to No Patient/Care [name]: Provided on Clinical Summary of Care: 02/25/2015 Form Type Recipient Paper Patient HS Electronic Signature(s) Signed: 02/25/2015 11:23:59 AM By: [name] Entered By: [name] on 02/25/2015 11:23:58 [name] ([id number]) -------------------------------------------------------------------------------- Lower Extremity Assessment Details Patient Name: [name] Date of Service: 02/25/2015 10:15 AM Medical Record Number: [id number] Patient Account Number: [id number] Date of Birth/Sex: 10-17-1920 (81 y.o. Female) Treating RN: [name] Primary Care Physician: [name] Other Clinician: Referring Physician: Terance [name], DAVID Treating Physician/Extender: [name] in Treatment: 2 Vascular Assessment Pulses: Posterior Tibial Palpable: [Right:No] Dorsalis Pedis Palpable: [Right:No] Extremity colors, hair growth, and conditions: Extremity Color: [Right:Normal] Hair Growth on Extremity: [Right:No] Temperature of Extremity: [Right:Warm] Capillary Refill: [Right:< 3 seconds] Toe Nail Assessment Left: Right: Thick: No Discolored: No Deformed: No Improper Length and Hygiene: Yes Electronic Signature(s) Signed: 02/25/2015 4:20:15 PM By: [name] Entered By: [name] on 02/25/2015 10:45:29 [name] ([id number]) -------------------------------------------------------------------------------- Multi Wound Chart Details Patient Name: [name] Date of Service: 02/25/2015 10:15 AM Medical Record Number: [id number] Patient Account Number: [id number] Date of Birth/Sex: [birth year] (81 y.o. 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] Comorbid History: [1:Arrhythmia, Deep Vein Thrombosis, Peripheral Arterial Disease, Type II Arterial Disease, Type II Arterial Disease, Type II Diabetes] [2:Arrhythmia, Deep Vein Thrombosis, Peripheral Diabetes] [3:Arrhythmia, Deep Vein Thrombosis,  Peripheral Diabetes] Date Acquired: [1:11/04/2014] [2:11/04/2014] [3:01/03/2015] Weeks of Treatment: [1:2] [2:2] [3:1] Wound Status: [1:Open] [2:Open] [3:Open] Measurements L x W x D 2x2.5x0.1 [2:2.1x1.1x0.1] [3:1.9x5.9x0.3] (cm) Area (cm) : [1:3.927] [2:1.814] [3:8.804] Volume (cm) : [1:0.393] [2:0.181] [3:2.641] % Reduction in Area: [1:28.60%] [2:-92.60%] [3:15.10%] % Reduction in  Volume: 28.50% [2:-92.60%] [3:36.30%] Classification: [1:Full Thickness Without Exposed Support Structures] [2:Full Thickness Without Exposed Support Structures] [3:Full Thickness Without Exposed Support Structures] HBO Classification: [1:Grade 1] [2:Grade 1] [3:Grade 2] Exudate Amount: [1:Large] [2:Small] [3:Large] Exudate Type: [1:Purulent] [2:Purulent] [3:Serous] Exudate Color: [1:yellow, brown, green] [2:yellow, brown, green] [3:amber] Wound Margin: [1:Flat and Intact] [2:Flat and Intact] [3:Distinct, outline attached] Granulation Amount: [1:None Present (0%)] [2:None Present (0%)] [3:None Present (0%)] Necrotic Amount: [1:Large (67-100%)] [2:Large (67-100%)] [3:Large (67-100%)] Necrotic Tissue: [1:Eschar, Adherent Slough Eschar, Adherent [name], Adherent Slough] Exposed Structures: [name], [name] ([id number]) Fascia: No Fascia:  No Fascia: No Fat: No Fat: No Fat: No Tendon: No Tendon: No Tendon: No Muscle: No Muscle: No Muscle: No Joint: No Joint: No Joint: No Bone: No Bone: No Bone: No Limited to Skin Limited to Skin Limited to Skin Breakdown Breakdown Breakdown Epithelialization: None None None Periwound Skin Texture: Edema: No Edema: No Edema: No Excoriation: No Excoriation: No Excoriation: No Induration: No Induration: No Induration: No Callus: No Callus: No Callus: No Crepitus: No Crepitus: No Crepitus: No Fluctuance: No Fluctuance: No Fluctuance: No Friable: No Friable: No Friable: No Rash: No Rash: No Rash: No Scarring: No Scarring: No Scarring: No Periwound Skin Maceration: No Maceration: No Maceration: No Moisture: Moist: No Moist: No Moist: No Dry/Scaly: No Dry/Scaly: No Dry/Scaly: No Periwound Skin Color: Erythema: Yes Erythema: Yes Atrophie Blanche: No Atrophie Blanche: No Atrophie Blanche: No Cyanosis: No Cyanosis: No Cyanosis: No Ecchymosis: No Ecchymosis: No Ecchymosis: No Erythema: No Hemosiderin Staining: No Hemosiderin Staining: No Hemosiderin Staining: No Mottled: No Mottled: No Mottled: No Pallor: No Pallor: No Pallor: No Rubor: No Rubor: No Rubor: No Erythema Location: Circumferential Circumferential N/A Temperature: No Abnormality No Abnormality No Abnormality Tenderness on Yes Yes Yes Palpation: Wound Preparation: Ulcer Cleansing: Ulcer Cleansing: Ulcer Cleansing: Rinsed/Irrigated with Rinsed/Irrigated with Rinsed/Irrigated with Saline Saline Saline Topical Anesthetic Topical Anesthetic Topical Anesthetic Applied: Other: lidocaine Applied: Other: lidocaine Applied: Other: lidocaine 4% 4% 4% Treatment Notes Electronic Signature(s) Signed: 02/25/2015 2:10:43 PM By: [name] Harder RN Entered By: [name] on 02/25/2015 10:57:29 [name]  ([id number]) -------------------------------------------------------------------------------- Multi-Disciplinary Care Plan Details Patient Name: [name] Date of Service: 02/25/2015 10:15 AM Medical Record Number: [id number] Patient Account Number: [id number] Date of Birth/Sex: 1921-01-26 (81 y.o. Female) Treating RN: [name] Primary Care Physician: [name] Other Clinician: Referring Physician: Dorothey [name] Treating Physician/Extender: [name] in Treatment: 2 Active Inactive Abuse / Safety / Falls / Self Care Management Nursing Diagnoses: Impaired physical mobility Potential for falls Goals: Patient will remain injury free Date Initiated: 02/11/2015 Goal Status: Active Patient/caregiver will verbalize understanding of skin care regimen Date Initiated: 02/11/2015 Goal Status: Active Patient/caregiver will verbalize/demonstrate measures taken to prevent injury and/or falls Date Initiated: 02/11/2015 Goal Status: Active Patient/caregiver will verbalize/demonstrate understanding of what to do in case of emergency Date Initiated: 02/11/2015 Goal Status: Active Interventions: Assess fall risk on admission and as needed Provide education on fall prevention Provide education on safe transfers Treatment Activities: Patient referred to home care : 02/25/2015 Notes: Nutrition Nursing Diagnoses: Imbalanced nutrition Potential for alteratiion in Nutrition/Potential for imbalanced nutrition Carranco, Wandra ([id number]) Goals: Patient/caregiver verbalizes understanding of need to maintain therapeutic glucose control per primary care physician Date Initiated: 02/11/2015 Goal Status: Active Patient/caregiver will maintain therapeutic glucose control Date Initiated: 02/11/2015 Goal Status: Active Interventions: Assess HgA1c results as ordered upon admission and as needed Provide education on elevated blood sugars and impact on wound healing Provide  education on nutrition Treatment Activities: Obtain  HgA1c : 02/25/2015 Notes: Orientation to the Wound Care Program Nursing Diagnoses: Knowledge deficit related to the wound healing center program Goals: Patient/caregiver will verbalize understanding of the Wound Healing Center Program Date Initiated: 02/11/2015 Goal Status: Active Interventions: Provide education on orientation to the wound center Notes: Wound/Skin Impairment Nursing Diagnoses: Impaired tissue integrity Knowledge deficit related to ulceration/compromised skin integrity Goals: Patient/caregiver will verbalize understanding of skin care regimen Date Initiated: 02/11/2015 Goal Status: Active Ulcer/skin breakdown will heal within 14 weeks Date Initiated: 02/11/2015 CATINA, NUSS ([id number]) Goal Status: Active Interventions: Assess patient/caregiver ability to obtain necessary supplies Assess patient/caregiver ability to perform ulcer/skin care regimen upon admission and as needed Assess ulceration(s) every visit Provide education on ulcer and skin care Treatment Activities: Skin care regimen initiated : 02/25/2015 Topical wound management initiated : 02/25/2015 Notes: Electronic Signature(s) Signed: 02/25/2015 2:10:43 PM By: [name] Harder RN Entered By: [name] on 02/25/2015 10:57:16 [name] ([id number]) -------------------------------------------------------------------------------- Patient/Caregiver Education Details Patient Name: [name] Date of Service: 02/25/2015 10:15 AM Medical Record Number: [id number] Patient Account Number: [id number] Date of Birth/Gender: Feb 06, 1921 (81 y.o. Female) Treating RN: [name] Primary Care Physician: [name] Other Clinician: Referring Physician: Dorothey [name] Treating Physician/Extender: [name] in Treatment: 2 Education Assessment Education Provided To: Patient and Caregiver Education Topics Provided Wound/Skin  Impairment: Handouts: Other: wound care as ordered Methods: Demonstration, Explain/Verbal Responses: State content correctly Electronic Signature(s) Signed: 02/25/2015 4:20:15 PM By: [name] Entered By: [name] on 02/25/2015 11:24:05 [name] ([id number]) -------------------------------------------------------------------------------- Wound Assessment Details Patient Name: [name] Date of Service: 02/25/2015 10:15 AM Medical Record Number: [id number] Patient Account Number: [id number] Date of Birth/Sex: Oct 09, 1920 (81 y.o. 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 Wound Open Wounding Event: Gradually Appeared Status: Date Acquired: 11/04/2014 Comorbid Arrhythmia, Deep Vein Thrombosis, Weeks Of Treatment: 2 History: Peripheral Arterial Disease, Type II Clustered Wound: No Diabetes Photos Wound Measurements Length: (cm) 2 Width: (cm) 2.5 Depth: (cm) 0.1 Area: (cm) 3.927 Volume: (cm) 0.393 % Reduction in Area: 28.6% % Reduction in Volume: 28.5% Epithelialization: None Tunneling: No Undermining: No Wound Description Full Thickness Without Foul Odor Aft Classification: Exposed Support Structures Diabetic Severity Grade 1 (Wagner): Wound Margin: Flat and Intact Exudate Amount: Large Exudate Type: Purulent Exudate Color: yellow, brown, green er Cleansing: No Wound Bed Granulation Amount: None Present (0%) Exposed Structure Necrotic Amount: Large (67-100%) Fascia Exposed: No Steinmiller, Idamae ([id number]) Necrotic Quality: Eschar, Adherent Slough Fat Layer Exposed: No Tendon Exposed: No Muscle Exposed: No Joint Exposed: No Bone Exposed: No Limited to Skin Breakdown Periwound  Skin Texture Texture Color No Abnormalities Noted: No No Abnormalities Noted: No Callus: No Atrophie Blanche: No Crepitus: No Cyanosis: No Excoriation: No Ecchymosis: No Fluctuance: No Erythema: Yes Friable: No Erythema Location: Circumferential Induration: No Hemosiderin Staining: No Localized Edema: No Mottled: No Rash: No Pallor: No Scarring: No Rubor: No Moisture Temperature / Pain No Abnormalities Noted: No Temperature: No Abnormality Dry / Scaly: No Tenderness on Palpation: Yes Maceration: No Moist: No Wound Preparation Ulcer Cleansing: Rinsed/Irrigated with Saline Topical Anesthetic Applied: Other: lidocaine 4%, Treatment Notes Wound #1 (Right Calcaneous) 1. Cleansed with: Clean wound with Normal Saline 2. Anesthetic Topical Lidocaine 4% cream to wound bed prior to debridement 4. Dressing Applied: Santyl Ointment 5. Secondary Dressing Applied Kerlix/Conform Non-Adherent pad 7. Secured with Tape Notes  packing strip to L BKA wound Electronic Signature(s) [name] NationsSILETZKY, Nazaria ([id number]) Signed: 02/25/2015 2:29:58 PM By: [name] Sitesorthy, Joanna Entered By: [name] Sitesorthy, Joanna on 02/25/2015 14:29:58 [name] NationsSILETZKY, Andrianna ([id number]) -------------------------------------------------------------------------------- Wound Assessment Details Patient Name: [name] NationsSILETZKY, Thula Date of Service: 02/25/2015 10:15 AM Medical Record Number: [id number] Patient Account Number: [id number] Date of Birth/Sex: 01/30/1921 (81 y.o. Female) Treating RN: [name] Sitesorthy, Joanna Primary Care Physician: [name] BasemanBRONSTEIN, DAVID Other Clinician: Referring Physician: Terance HartBRONSTEIN, DAVID Treating Physician/Extender: [name] ReBritto, Errol Weeks in Treatment: 2 Wound Status Wound Number: 2 Primary Arterial Insufficiency Ulcer Etiology: Wound Location: Right Toe Third Wound Open Wounding Event: Gradually Appeared Status: Date Acquired: 11/04/2014 Comorbid Arrhythmia, Deep Vein Thrombosis, Weeks Of Treatment: 2 History: Peripheral  Arterial Disease, Type II Clustered Wound: No Diabetes Photos Photo Uploaded By: [name] Sitesorthy, Joanna on 02/25/2015 14:28:43 Wound Measurements Length: (cm) 2.1 % Reduction i Width: (cm) 1.1 % Reduction i Depth: (cm) 0.1 Epithelializa Area: (cm) 1.814 Tunneling: Volume: (cm) 0.181 Undermining: n Area: -92.6% n Volume: -92.6% tion: None No No Wound Description Full Thickness Without Foul Odor Aft Classification: Exposed Support Structures Diabetic Severity Grade 1 (Wagner): Wound Margin: Flat and Intact Exudate Amount: Small Exudate Type: Purulent Exudate Color: yellow, brown, green er Cleansing: No Wound Bed Granulation Amount: None Present (0%) Exposed Structure Overley, Kristyanna ([id number]) Necrotic Amount: Large (67-100%) Fascia Exposed: No Necrotic Quality: Eschar, Adherent Slough Fat Layer Exposed: No Tendon Exposed: No Muscle Exposed: No Joint Exposed: No Bone Exposed: No Limited to Skin Breakdown Periwound Skin Texture Texture Color No Abnormalities Noted: No No Abnormalities Noted: No Callus: No Atrophie Blanche: No Crepitus: No Cyanosis: No Excoriation: No Ecchymosis: No Fluctuance: No Erythema: Yes Friable: No Erythema Location: Circumferential Induration: No Hemosiderin Staining: No Localized Edema: No Mottled: No Rash: No Pallor: No Scarring: No Rubor: No Moisture Temperature / Pain No Abnormalities Noted: No Temperature: No Abnormality Dry / Scaly: No Tenderness on Palpation: Yes Maceration: No Moist: No Wound Preparation Ulcer Cleansing: Rinsed/Irrigated with Saline Topical Anesthetic Applied: Other: lidocaine 4%, Treatment Notes Wound #2 (Right Toe Third) 1. Cleansed with: Clean wound with Normal Saline 2. Anesthetic Topical Lidocaine 4% cream to wound bed prior to debridement 4. Dressing Applied: Santyl Ointment 5. Secondary Dressing Applied Kerlix/Conform Non-Adherent pad 7. Secured with Tape Notes packing strip to  L BKA wound Procida, Tawanda ([id number]) Electronic Signature(s) Signed: 02/25/2015 4:20:15 PM By: [name] Sitesorthy, Joanna Entered By: [name] Sitesorthy, Joanna on 02/25/2015 10:46:02 [name] NationsSILETZKY, Arli ([id number]) -------------------------------------------------------------------------------- Wound Assessment Details Patient Name: [name] NationsSILETZKY, Jenay Date of Service: 02/25/2015 10:15 AM Medical Record Number: [id number] Patient Account Number: [id number] Date of Birth/Sex: 05/16/1921 (81 y.o. Female) Treating RN: [name] Sitesorthy, Joanna Primary Care Physician: [name] BasemanBRONSTEIN, DAVID Other Clinician: Referring Physician: Terance HartBRONSTEIN, DAVID Treating Physician/Extender: [name] ReBritto, Errol Weeks in Treatment: 2 Wound Status Wound Number: 3 Primary Dehisced Wound Etiology: Wound Location: Left Amputation Site - Below Knee Wound Open Status: Wounding Event: Surgical Injury Comorbid Arrhythmia, Deep Vein Thrombosis, Date Acquired: 01/03/2015 History: Peripheral Arterial Disease, Type II Weeks Of Treatment: 1 Diabetes Clustered Wound: No Photos Photo Uploaded By: [name] Sitesorthy, Joanna on 02/25/2015 14:29:23 Wound Measurements Length: (cm) 1.9 Width: (cm) 5.9 Depth: (cm) 0.3 Area: (cm) 8.804 Volume: (cm) 2.641 % Reduction in Area: 15.1% % Reduction in Volume: 36.3% Epithelialization: None Tunneling: No Undermining: No Wound Description Full Thickness Without Classification: Exposed Support Structures Diabetic Severity Grade 2 (Wagner): Wound Margin: Distinct, outline attached Exudate Amount: Large Exudate Type: Serous Exudate Color: amber Foul Odor After Cleansing: No Wound Bed Granulation Amount: None Present (0%) Exposed Structure Conway, Darlene ([id number]) Necrotic Amount: Large (  67-100%) Fascia Exposed: No Necrotic Quality: Eschar, Adherent Slough Fat Layer Exposed: No Tendon Exposed: No Muscle Exposed: No Joint Exposed: No Bone Exposed: No Limited to Skin Breakdown Periwound Skin Texture Texture Color No  Abnormalities Noted: No No Abnormalities Noted: No Callus: No Atrophie Blanche: No Crepitus: No Cyanosis: No Excoriation: No Ecchymosis: No Fluctuance: No Erythema: No Friable: No Hemosiderin Staining: No Induration: No Mottled: No Localized Edema: No Pallor: No Rash: No Rubor: No Scarring: No Temperature / Pain Moisture Temperature: No Abnormality No Abnormalities Noted: No Tenderness on Palpation: Yes Dry / Scaly: No Maceration: No Moist: No Wound Preparation Ulcer Cleansing: Rinsed/Irrigated with Saline Topical Anesthetic Applied: Other: lidocaine 4%, Treatment Notes Wound #3 (Left Amputation Site - Below Knee) 1. Cleansed with: Clean wound with Normal Saline 2. Anesthetic Topical Lidocaine 4% cream to wound bed prior to debridement 4. Dressing Applied: Santyl Ointment 5. Secondary Dressing Applied Kerlix/Conform Non-Adherent pad 7. Secured with Tape Notes packing strip to L BKA wound Gatti, Tamyrah ([id number]) Electronic Signature(s) Signed: 02/25/2015 4:20:15 PM By: [name] Entered By: [name] on 02/25/2015 10:46:13 [name] ([id number]) -------------------------------------------------------------------------------- Vitals Details Patient Name: [name] Date of Service: 02/25/2015 10:15 AM Medical Record Number: [id number] Patient Account Number: [id number] Date of Birth/Sex: 1921/09/07 (81 y.o. Female) Treating RN: [name] Primary Care Physician: [name] Other Clinician: Referring Physician: Terance [name], DAVID Treating Physician/Extender: [name] in Treatment: 2 Vital Signs Time Taken: 10:33 Temperature (F): 98.1 Height (in): 62 Pulse (bpm): 62 Weight (lbs): 155 Respiratory Rate (breaths/min): 18 Body Mass Index (BMI): 28.3 Blood Pressure (mmHg): 132/52 Reference Range: 80 - 120 mg / dl Electronic Signature(s) Signed: 02/25/2015 4:20:15 PM By: [name] Entered By: [name] on  02/25/2015 10:35:35

## 2015-03-04 ENCOUNTER — Encounter: Payer: Medicare Other | Attending: Surgery | Admitting: Surgery

## 2015-03-04 DIAGNOSIS — L97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: Secondary | ICD-10-CM | POA: Diagnosis not present

## 2015-03-04 DIAGNOSIS — T8131XA Disruption of external operation (surgical) wound, not elsewhere classified, initial encounter: Secondary | ICD-10-CM | POA: Insufficient documentation

## 2015-03-04 DIAGNOSIS — L97812 Non-pressure chronic ulcer of other part of right lower leg with fat layer exposed: Secondary | ICD-10-CM | POA: Insufficient documentation

## 2015-03-04 DIAGNOSIS — I70235 Atherosclerosis of native arteries of right leg with ulceration of other part of foot: Secondary | ICD-10-CM | POA: Diagnosis not present

## 2015-03-04 DIAGNOSIS — X58XXXA Exposure to other specified factors, initial encounter: Secondary | ICD-10-CM | POA: Insufficient documentation

## 2015-03-04 DIAGNOSIS — Z89512 Acquired absence of left leg below knee: Secondary | ICD-10-CM | POA: Insufficient documentation

## 2015-03-04 DIAGNOSIS — E11621 Type 2 diabetes mellitus with foot ulcer: Secondary | ICD-10-CM | POA: Insufficient documentation

## 2015-03-04 NOTE — Progress Notes (Addendum)
NELEH, MULDOON ([id number]) Visit Report for 03/04/2015 Chief Complaint Document Details Patient Name: [name], [name] Date of Service: 03/04/2015 9:30 AM Medical Record Number: [id number] Patient Account Number: [id number] Date of Birth/Sex: 06/15/1921 (81 y.o. Female) Treating RN: Primary Care Physician: Terance Hart, DAVID Other Clinician: Referring Physician: Terance Hart, DAVID Treating Physician/Extender: Rudene Re in Treatment: 3 Information Obtained from: Patient Chief Complaint Patient presents to the wound care center for a consult due non healing wound. 81 year old patient with comes with a history of a ulcerated area to the right third toe and the right heel which she's had for about 2 months. Electronic Signature(s) Signed: 03/04/2015 12:36:02 PM By: Evlyn Kanner MD, FACS Entered By: Evlyn Kanner on 03/04/2015 10:30:23 [name] ([id number]) -------------------------------------------------------------------------------- Debridement Details Patient Name: [name] Date of Service: 03/04/2015 9:30 AM Medical Record Number: [id number] Patient Account Number: [id number] Date of Birth/Sex: 06-30-1921 (81 y.o. Female) Treating RN: Primary Care Physician: Terance Hart, DAVID Other Clinician: Referring Physician: Terance Hart, DAVID Treating Physician/Extender: Rudene Re in Treatment: 3 Debridement Performed for Wound #1 Right Calcaneous Assessment: Performed By: Physician Tristan Schroeder., MD Debridement: Debridement Pre-procedure Yes Verification/Time Out Taken: Start Time: 10:19 Pain Control: Lidocaine 4% Topical Solution Level: Skin/Subcutaneous Tissue Total Area Debrided (L x 2 (cm) x 2.3 (cm) = 4.6 (cm) W): Tissue and other Viable, Non-Viable, Fibrin/Slough, Subcutaneous material debrided: Instrument: Curette Bleeding: Minimum Hemostasis Achieved: Pressure End Time: 10:24 Procedural Pain: 2 Post Procedural Pain: 0 Response to Treatment:  Procedure was tolerated well Post Debridement Measurements of Total Wound Length: (cm) 2 Width: (cm) 2.3 Depth: (cm) 0.2 Volume: (cm) 0.723 Electronic Signature(s) Signed: 03/04/2015 12:36:02 PM By: Evlyn Kanner MD, FACS Entered By: Evlyn Kanner on 03/04/2015 10:29:40 [name] ([id number]) -------------------------------------------------------------------------------- Debridement Details Patient Name: [name] Date of Service: 03/04/2015 9:30 AM Medical Record Number: [id number] Patient Account Number: [id number] Date of Birth/Sex: 1921-02-27 (81 y.o. Female) Treating RN: Primary Care Physician: Terance Hart, DAVID Other Clinician: Referring Physician: Terance Hart, DAVID Treating Physician/Extender: Rudene Re in Treatment: 3 Debridement Performed for Wound #2 Right Toe Third Assessment: Performed By: Physician Tristan Schroeder., MD Debridement: Debridement Pre-procedure Yes Verification/Time Out Taken: Start Time: 10:17 Pain Control: Lidocaine 4% Topical Solution Level: Skin/Subcutaneous Tissue Total Area Debrided (L x 1.8 (cm) x 1.1 (cm) = 1.98 (cm) W): Tissue and other Viable, Non-Viable, Fibrin/Slough, Subcutaneous material debrided: Instrument: Curette Bleeding: Minimum Hemostasis Achieved: Pressure End Time: 10:19 Procedural Pain: 2 Post Procedural Pain: 0 Response to Treatment: Procedure was tolerated well Post Debridement Measurements of Total Wound Length: (cm) 1.8 Width: (cm) 1.1 Depth: (cm) 0.1 Volume: (cm) 0.156 Electronic Signature(s) Signed: 03/04/2015 12:36:02 PM By: Evlyn Kanner MD, FACS Entered By: Evlyn Kanner on 03/04/2015 10:29:59 [name] ([id number]) -------------------------------------------------------------------------------- Debridement Details Patient Name: [name] Date of Service: 03/04/2015 9:30 AM Medical Record Number: [id number] Patient Account Number: [id number] Date of Birth/Sex: 1921-08-05 (81 y.o.  Female) Treating RN: Primary Care Physician: Terance Hart, DAVID Other Clinician: Referring Physician: Terance Hart, DAVID Treating Physician/Extender: Rudene Re in Treatment: 3 Debridement Performed for Wound #3 Left Amputation Site - Below Knee Assessment: Performed By: Physician Tristan Schroeder., MD Debridement: Debridement Pre-procedure Yes Verification/Time Out Taken: Start Time: 10:12 Pain Control: Lidocaine 4% Topical Solution Level: Skin/Subcutaneous Tissue Total Area Debrided (L x 2 (cm) x 6.2 (cm) = 12.4 (cm) W): Tissue and other Viable, Non-Viable, Fibrin/Slough, Subcutaneous material debrided: Instrument: Curette Bleeding: Minimum Hemostasis Achieved: Pressure End Time: 10:17 Procedural Pain: 3 Post Procedural Pain: 0 Response to Treatment: Procedure was tolerated well  Post Debridement Measurements of Total Wound Length: (cm) 2 Width: (cm) 6.2 Depth: (cm) 0.9 Volume: (cm) 8.765 Electronic Signature(s) Signed: 03/04/2015 12:36:02 PM By: Evlyn Kanner MD, FACS Entered By: Evlyn Kanner on 03/04/2015 10:30:15 [name] ([id number]) -------------------------------------------------------------------------------- HPI Details Patient Name: [name] Date of Service: 03/04/2015 9:30 AM Medical Record Number: [id number] Patient Account Number: [id number] Date of Birth/Sex: Oct 03, 1920 (81 y.o. Female) Treating RN: Primary Care Physician: Terance Hart, DAVID Other Clinician: Referring Physician: Terance Hart, DAVID Treating Physician/Extender: Rudene Re in Treatment: 3 History of Present Illness Location: right medial heel and right third toe Quality: Patient reports experiencing a dull pain to affected area(s). Severity: Patient states wound are getting worse. Duration: Patient has had the wound for > 3 months prior to seeking treatment at the wound center Timing: Pain in wound is Intermittent (comes and goes Context: The wound appeared  gradually over time Modifying Factors: Consults to this date include:vascular surgeon and several recent surgeries. Associated Signs and Symptoms: Patient reports having foul odor and drainage from the left below-knee amputation site. HPI Description: A pleasant 81 year old patient who is known to have diabetes mellitus for several years has recently gone through a series of operations with the vascular surgeon Dr. Gilda Crease. In January and February she had several surgeries on the left lower extremity with an attempt to have limb salvage for gangrenous changes of her forefoot. 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 On 01/07/2015 she also had a right lower extremity distal runoff with a angioplasty of the right anterior tibial artery to maximize her blood flow to the foot. She recently had her staples removed at Dr. Marijean Heath office on 01/31/2015 and at that time a right lower extremity duplex was done which showed patent vessels and a patent stent with distal occluded posterior tibial artery. Though the duplex was noncritical the recommendations from the PA at the vascular surgery office was that of a angiogram to be done but the patient said she would rather try some bone care before. The patient has received doxycycline and Cipro in the recent past and she takes oral medications for her diabetes. Other than that I reviewed her list of all her medications. No recent hemoglobin A1c has been done and no recent x-rays of the right foot have been done. 02/18/2015 -- x-ray of the right foot was done on 02/11/2015 and it shows no evidence of acute osteomyelitis of the third toe or of the calcaneus. She had gone to the vascular surgery office and they had noted that there is dehiscence of the left part of the amputation site of the below-knee wound and they have asked [place] to kindly take over the care of this. There've been  doing dressings for the right heel and right third toe. 02/25/2015 -- we have received notes from the vascular group who saw her last on 02/13/2015 and she was seen by the PA Ms. Cleda Daub. She had recommended that the patient continue to follow with [place] for wound care including management of the left BKA stump, which has had dehiscence of the lateral part. They will consider repeating a arterial duplex or angiogram if the right lower extremity does not heal within a reasonable period of time. Electronic Signature(s) Signed: 03/04/2015 12:36:02 PM By: Evlyn Kanner MD, FACS [place], [place] ([id number]) Entered By: Evlyn Kanner on 03/04/2015 10:30:56 [name] ([id number]) -------------------------------------------------------------------------------- Physical Exam Details Patient Name: [name] Date of Service: 03/04/2015  9:30 AM Medical Record Number: [id number] Patient Account Number: [id number] Date of Birth/Sex: October 02, 1920 (81 y.o. Female) Treating RN: Primary Care Physician: Terance Hart, DAVID Other Clinician: Referring Physician: Terance Hart, DAVID Treating Physician/Extender: Rudene Re in Treatment: 3 Constitutional . Pulse regular. Respirations normal and unlabored. Afebrile. . Eyes Nonicteric. Reactive to light. Ears, Nose, Mouth, and Throat Lips, teeth, and gums WNL.Marland Kitchen Moist mucosa without lesions . Neck supple and nontender. No palpable supraclavicular or cervical adenopathy. Normal sized without goiter. Respiratory WNL. No retractions.. Cardiovascular nonpalpable pedal pulses right lower extremity. some edema of the left below-knee amputation stump.. Integumentary (Hair, Skin) All the wounds have subcutaneous slough and debris which needs to be sharply debrided with a curette.Marland Kitchen No crepitus or fluctuance. No peri-wound warmth or erythema. No masses.Marland Kitchen Psychiatric Judgement and insight Intact.. No evidence of depression, anxiety, or  agitation.. Electronic Signature(s) Signed: 03/04/2015 12:36:02 PM By: Evlyn Kanner MD, FACS Entered By: Evlyn Kanner on 03/04/2015 10:32:19 [name] ([id number]) -------------------------------------------------------------------------------- Physician Orders Details Patient Name: [name] Date of Service: 03/04/2015 9:30 AM Medical Record Number: [id number] Patient Account Number: [id number] Date of Birth/Sex: 1921/09/25 (81 y.o. Female) Treating RN: Renee Harder Primary Care Physician: Dorothey Baseman Other Clinician: Referring Physician: Dorothey Baseman Treating Physician/Extender: Rudene Re in Treatment: 3 Verbal / Phone Orders: Yes Clinician: Renee Harder Read Back and Verified: Yes Diagnosis Coding Wound Cleansing Wound #1 Right Calcaneous o Clean wound with Normal Saline. o Clean wound with Normal Saline. Wound #2 Right Toe Third o Clean wound with Normal Saline. o Clean wound with Normal Saline. Wound #3 Left Amputation Site - Below Knee o Clean wound with Normal Saline. o Clean wound with Normal Saline. Anesthetic Wound #1 Right Calcaneous o Topical Lidocaine 4% cream applied to wound bed prior to debridement Wound #2 Right Toe Third o Topical Lidocaine 4% cream applied to wound bed prior to debridement Wound #3 Left Amputation Site - Below Knee o Topical Lidocaine 4% cream applied to wound bed prior to debridement Primary Wound Dressing Wound #1 Right Calcaneous o Santyl Ointment Wound #2 Right Toe Third o Santyl Ointment Wound #3 Left Amputation Site - Below Knee o Santyl Ointment Secondary Dressing Wound #1 Right Calcaneous o Gauze and Kerlix/Conform [place], Lawanna ([id number]) Wound #2 Right Toe Third o Gauze and Kerlix/Conform Wound #3 Left Amputation Site - Below Knee o Gauze and Kerlix/Conform Dressing Change Frequency Wound #1 Right Calcaneous o Change dressing every day. Wound #2 Right Toe  Third o Change dressing every day. Wound #3 Left Amputation Site - Below Knee o Change dressing every day. Follow-up Appointments Wound #1 Right Calcaneous o Return Appointment in 1 week. Wound #2 Right Toe Third o Return Appointment in 1 week. Wound #3 Left Amputation Site - Below Knee o Return Appointment in 1 week. Off-Loading Wound #1 Right Calcaneous o Open toe surgical shoe to: - right foot o Other: - Offloading boot at night right foot Wound #2 Right Toe Third o Open toe surgical shoe to: - right foot o Other: - Offloading boot at night right foot Wound #3 Left Amputation Site - Below Knee o Open toe surgical shoe to: - right foot o Other: - Offloading boot at night right foot Home Health Wound #1 Right Calcaneous o Initiate Home Health for Skilled Nursing - Continue Tomasa Hosteller Continue Home Health Visits o Home Health Nurse may visit PRN to address patientos wound care needs. o FACE TO FACE ENCOUNTER: MEDICARE and MEDICAID PATIENTS: I certify that this patient is under  my care and that I had a face-to-face encounter that meets the physician face-to-face encounter requirements with this patient on this date. The encounter with the patient was in [place], [place] ([id number]) whole or in part for the following MEDICAL CONDITION: (primary reason for Home Healthcare) MEDICAL NECESSITY: I certify, that based on my findings, NURSING services are a medically necessary home health service. HOME BOUND STATUS: I certify that my clinical findings support that this patient is homebound (i.e., Due to illness or injury, pt requires aid of supportive devices such as crutches, cane, wheelchairs, walkers, the use of special transportation or the assistance of another person to leave their place of residence. There is a normal inability to leave the home and doing so requires considerable and taxing effort. Other absences are for medical reasons / religious  services and are infrequent or of short duration when for other reasons). o If current dressing causes regression in wound condition, may D/C ordered dressing product/s and apply Normal Saline Moist Dressing daily until next Wound Healing Center / Other MD appointment. Notify Wound Healing Center of regression in wound condition at [phone]. o Please direct any NON-WOUND related issues/requests for orders to patient's Primary Care Physician Wound #2 Right Toe Third o Initiate Home Health for Skilled Nursing - Continue Tomasa Hosteller Continue Home Health Visits o Home Health Nurse may visit PRN to address patientos wound care needs. o FACE TO FACE ENCOUNTER: MEDICARE and MEDICAID PATIENTS: I certify that this patient is under my care and that I had a face-to-face encounter that meets the physician face-to-face encounter requirements with this patient on this date. The encounter with the patient was in whole or in part for the following MEDICAL CONDITION: (primary reason for Home Healthcare) MEDICAL NECESSITY: I certify, that based on my findings, NURSING services are a medically necessary home health service. HOME BOUND STATUS: I certify that my clinical findings support that this patient is homebound (i.e., Due to illness or injury, pt requires aid of supportive devices such as crutches, cane, wheelchairs, walkers, the use of special transportation or the assistance of another person to leave their place of residence. There is a normal inability to leave the home and doing so requires considerable and taxing effort. Other absences are for medical reasons / religious services and are infrequent or of short duration when for other reasons). o If current dressing causes regression in wound condition, may D/C ordered dressing product/s and apply Normal Saline Moist Dressing daily until next Wound Healing Center / Other MD appointment. Notify Wound Healing Center of regression in  wound condition at [phone]. o Please direct any NON-WOUND related issues/requests for orders to patient's Primary Care Physician Wound #3 Left Amputation Site - Below Knee o Initiate Home Health for Skilled Nursing - Continue Tomasa Hosteller Continue Home Health Visits o Home Health Nurse may visit PRN to address patientos wound care needs. o FACE TO FACE ENCOUNTER: MEDICARE and MEDICAID PATIENTS: I certify that this patient is under my care and that I had a face-to-face encounter that meets the physician face-to-face encounter requirements with this patient on this date. The encounter with the patient was in whole or in part for the following MEDICAL CONDITION: (primary reason for Home Healthcare) MEDICAL NECESSITY: I certify, that based on my findings, NURSING services are a medically necessary home health service. HOME BOUND STATUS: I certify that my clinical findings support that this patient is homebound (i.e., Due to illness or injury, pt requires aid of supportive devices  such as crutches, cane, wheelchairs, walkers, the use of special transportation or the assistance of another person to leave their place of residence. There is a [place], [place] ([id number]) normal inability to leave the home and doing so requires considerable and taxing effort. Other absences are for medical reasons / religious services and are infrequent or of short duration when for other reasons). o If current dressing causes regression in wound condition, may D/C ordered dressing product/s and apply Normal Saline Moist Dressing daily until next Wound Healing Center / Other MD appointment. Notify Wound Healing Center of regression in wound condition at [phone]. o Please direct any NON-WOUND related issues/requests for orders to patient's Primary Care Physician Medications-please add to medication list. Wound #1 Right Calcaneous o Santyl Enzymatic Ointment o Other: - OTC multivitamin with  zinc, copper, and selenium Wound #2 Right Toe Third o Santyl Enzymatic Ointment o Other: - OTC multivitamin with zinc, copper, and selenium Wound #3 Left Amputation Site - Below Knee o Santyl Enzymatic Ointment o Other: - OTC multivitamin with zinc, copper, and selenium Electronic Signature(s) Signed: 03/04/2015 12:36:02 PM By: Evlyn Kanner MD, FACS Signed: 03/04/2015 4:43:53 PM By: Renee Harder RN Entered By: Renee Harder on 03/04/2015 10:31:14 [name] ([id number]) -------------------------------------------------------------------------------- Problem List Details Patient Name: [name] Date of Service: 03/04/2015 9:30 AM Medical Record Number: [id number] Patient Account Number: [id number] Date of Birth/Sex: 02/14/1921 (81 y.o. 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 02/11/2015 Yes fat layer exposed L97.812 Non-pressure chronic ulcer of other part of right lower leg 02/11/2015 Yes with fat layer exposed T81.31XA Disruption of external operation (surgical) wound, not 02/18/2015 Yes elsewhere classified, initial encounter Inactive Problems Resolved Problems Electronic Signature(s) Signed: 03/04/2015 12:36:02 PM By: Evlyn Kanner MD, FACS Entered By: Evlyn Kanner on 03/04/2015 10:29:20 [name] ([id number]) -------------------------------------------------------------------------------- Progress Note Details Patient Name: [name] Date of  Service: 03/04/2015 9:30 AM Medical Record Number: [id number] Patient Account Number: [id number] Date of Birth/Sex: 09/17/1921 (81 y.o. Female) Treating RN: Primary Care Physician: Terance Hart, DAVID Other Clinician: Referring Physician: Terance Hart, DAVID Treating Physician/Extender: Rudene Re in Treatment: 3 Subjective Chief Complaint Information obtained from Patient Patient presents to the wound care center for a consult due non healing wound. 81 year old patient with comes with a history of a ulcerated area to the right third toe and the right heel which she's had for about 2 months. History of Present Illness (HPI) The following HPI elements were documented for the patient's wound: Location: right medial heel and right third toe Quality: Patient reports experiencing a dull pain to affected area(s). Severity: Patient states wound are getting worse. Duration: Patient has had the wound for > 3 months prior to seeking treatment at the wound center Timing: Pain in wound is Intermittent (comes and goes Context: The wound appeared gradually over time Modifying Factors: Consults to this date include:vascular surgeon and several recent surgeries. Associated Signs and Symptoms: Patient reports having foul odor and drainage from the left below-knee amputation site. A pleasant 81 year old patient who is known to have diabetes mellitus for several years has recently gone through a  series of operations with the vascular surgeon Dr. Gilda Crease. In January and February she had several surgeries on the left lower extremity with an attempt to have limb salvage for gangrenous changes of her forefoot. 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 On 01/07/2015 she also had a right lower extremity distal runoff with a angioplasty of the right anterior tibial artery to maximize her blood flow to the foot. She recently had her  staples removed at Dr. Marijean Heath office on 01/31/2015 and at that time a right lower extremity duplex was done which showed patent vessels and a patent stent with distal occluded posterior tibial artery. Though the duplex was noncritical the recommendations from the PA at the vascular surgery office was that of a angiogram to be done but the patient said she would rather try some bone care before. The patient has received doxycycline and Cipro in the recent past and she takes oral medications for her diabetes. Other than that I reviewed her list of all her medications. No recent hemoglobin A1c has been done and no recent x-rays of the right foot have been done. 02/18/2015 -- x-ray of the right foot was done on 02/11/2015 and it shows no evidence of acute osteomyelitis of the third toe or of the calcaneus. She had gone to the vascular surgery office and they had noted that there is dehiscence of the left part of the amputation site of the below-knee wound and they have asked [place] to kindly take over the care of this. There've been doing dressings for the right heel and right third toe. STANA, BAYON ([id number]) 02/25/2015 -- we have received notes from the vascular group who saw her last on 02/13/2015 and she was seen by the PA Ms. Cleda Daub. She had recommended that the patient continue to follow with [place] for wound care including management of the left BKA stump, which has had dehiscence of the lateral part. They will consider repeating a arterial duplex or angiogram if the right lower extremity does not heal within a reasonable period of time. Objective Constitutional Pulse regular. Respirations normal and unlabored. Afebrile. Vitals Time Taken: 9:45 AM, Height: 62 in, Weight: 155 lbs, BMI: 28.3, Temperature: 97.6 F, Pulse: 53 bpm, Respiratory Rate: 16 breaths/min, Blood Pressure: 108/54 mmHg. Eyes Nonicteric. Reactive to light. Ears, Nose, Mouth, and Throat Lips, teeth, and gums  WNL.Marland Kitchen Moist mucosa without lesions . Neck supple and nontender. No palpable supraclavicular or cervical adenopathy. Normal sized without goiter. Respiratory WNL. No retractions.. Cardiovascular nonpalpable pedal pulses right lower extremity. some edema of the left below-knee amputation stump.Marland Kitchen Psychiatric Judgement and insight Intact.. No evidence of depression, anxiety, or agitation.. Integumentary (Hair, Skin) All the wounds have subcutaneous slough and debris which needs to be sharply debrided with a curette.Marland Kitchen No crepitus or fluctuance. No peri-wound warmth or erythema. No masses.. Wound #1 status is Open. Original cause of wound was Gradually Appeared. The wound is located on the Right Calcaneous. The wound measures 2cm length x 2.3cm width x 0.1cm depth; 3.613cm^2 area and 0.361cm^3 volume. The wound is limited to skin breakdown. There is no tunneling or undermining noted. There is a large amount of purulent drainage noted. The wound margin is flat and intact. There is no granulation within the wound bed. There is a large (67-100%) amount of necrotic tissue within the wound Shanafelt, Jazzelle ([id number]) bed including Eschar and Adherent Slough. The periwound skin appearance exhibited: Erythema. The  periwound skin appearance did not exhibit: Callus, Crepitus, Excoriation, Fluctuance, Friable, Induration, Localized Edema, Rash, Scarring, Dry/Scaly, Maceration, Moist, Atrophie Blanche, Cyanosis, Ecchymosis, Hemosiderin Staining, Mottled, Pallor, Rubor. The surrounding wound skin color is noted with erythema which is circumferential. Periwound temperature was noted as No Abnormality. The periwound has tenderness on palpation. Wound #2 status is Open. Original cause of wound was Gradually Appeared. The wound is located on the Right Toe Third. The wound measures 1.8cm length x 1.1cm width x 0.1cm depth; 1.555cm^2 area and 0.156cm^3 volume. The wound is limited to skin breakdown. There is no  tunneling or undermining noted. There is a small amount of purulent drainage noted. The wound margin is flat and intact. There is no granulation within the wound bed. There is a large (67-100%) amount of necrotic tissue within the wound bed including Eschar and Adherent Slough. The periwound skin appearance exhibited: Erythema. The periwound skin appearance did not exhibit: Callus, Crepitus, Excoriation, Fluctuance, Friable, Induration, Localized Edema, Rash, Scarring, Dry/Scaly, Maceration, Moist, Atrophie Blanche, Cyanosis, Ecchymosis, Hemosiderin Staining, Mottled, Pallor, Rubor. The surrounding wound skin color is noted with erythema which is circumferential. Periwound temperature was noted as No Abnormality. The periwound has tenderness on palpation. Wound #3 status is Open. Original cause of wound was Surgical Injury. The wound is located on the Left Amputation Site - Below Knee. The wound measures 2cm length x 6.2cm width x 0.3cm depth; 9.739cm^2 area and 2.922cm^3 volume. The wound is limited to skin breakdown. There is no tunneling or undermining noted. There is a large amount of serous drainage noted. The wound margin is distinct with the outline attached to the wound base. There is no granulation within the wound bed. There is a large (67-100%) amount of necrotic tissue within the wound bed including Eschar and Adherent Slough. The periwound skin appearance did not exhibit: Callus, Crepitus, Excoriation, Fluctuance, Friable, Induration, Localized Edema, Rash, Scarring, Dry/Scaly, Maceration, Moist, Atrophie Blanche, Cyanosis, Ecchymosis, Hemosiderin Staining, Mottled, Pallor, Rubor, Erythema. Periwound temperature was noted as No Abnormality. The periwound has tenderness on palpation. 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 L97.812 - Non-pressure chronic ulcer of other part of right lower leg with fat layer exposed T81.31XA - Disruption of external operation (surgical) wound, not elsewhere classified, initial encounter Diagnoses ICD-10 E11.621: Type 2 diabetes mellitus with foot ulcer I70.235: Atherosclerosis of native arteries of right leg with ulceration of other part of foot Brecheen, Robbyn ([id number]) W09.811: Acquired absence of left leg below knee L97.412: Non-pressure chronic ulcer of right heel and midfoot with fat layer exposed L97.812: Non-pressure chronic ulcer of other part of right lower leg with fat layer exposed T81.31XA: Disruption of external operation (surgical) wound, not elsewhere classified, initial encounter Continuation of the Santyl ointment to all the wounds and appropriate application of a shrinker to the left BKA stump. she will continue to see me at weekly intervals. 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 There was a Skin/Subcutaneous Tissue Debridement ([id number]) debridement with total area of 4.6 sq cm performed by Tristan Schroeder., MD. with the following instrument(s): Curette to remove Viable and Non-Viable tissue/material including Fibrin/Slough and Subcutaneous after achieving pain control using Lidocaine 4% Topical Solution.  A time out was conducted prior to the start of the procedure. A Minimum amount of bleeding was controlled with Pressure. The procedure was tolerated well with a pain level of 2 throughout and a pain level of 0 following the procedure. Post Debridement Measurements: 2cm length x 2.3cm width x 0.2cm depth; 0.723cm^3 volume. 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 There was a Skin/Subcutaneous Tissue Debridement ([id number](11042-11047)  debridement with total area of 1.98 sq cm performed by Tristan SchroederBritto, Jabin Tapp J., MD. with the following instrument(s): Curette to remove Viable and Non-Viable tissue/material including Fibrin/Slough and Subcutaneous after achieving pain control using Lidocaine 4% Topical Solution. A time out was conducted prior to the start of the procedure. A Minimum amount of bleeding was controlled with Pressure. The procedure was tolerated well with a pain level of 2 throughout and a pain level of 0 following the procedure. Post Debridement Measurements: 1.8cm length x 1.1cm width x 0.1cm depth; 0.156cm^3 volume. Wound #3 Wound #3 is a Dehisced Wound located on the Left Amputation Site - Below Knee . There was a Skin/Subcutaneous Tissue Debridement ([id number](11042-11047) debridement with total area of 12.4 sq cm performed by Tristan SchroederBritto, Rockland Kotarski J., MD. with the following instrument(s): Curette to remove Viable and Non-Viable tissue/material including Fibrin/Slough and Subcutaneous after achieving pain control using Lidocaine 4% Topical Solution. A time out was conducted prior to the start of the procedure. A Minimum amount of bleeding was controlled with Pressure. The procedure was tolerated well with a pain level of 3 throughout and a pain level of 0 following the procedure. Post Debridement Measurements: 2cm length x 6.2cm width x 0.9cm depth; 8.765cm^3 volume. Dario, Joylynn ([id number]) Plan Wound Cleansing: Wound #1 Right Calcaneous: Clean wound with Normal Saline. Clean wound with Normal Saline. Wound #2 Right Toe Third: Clean wound with Normal Saline. Clean wound with Normal Saline. Wound #3 Left Amputation Site - Below Knee: Clean wound with Normal Saline. Clean wound with Normal Saline. Anesthetic: Wound #1 Right Calcaneous: Topical Lidocaine 4% cream applied to wound bed prior to debridement Wound #2 Right Toe Third: Topical Lidocaine 4% cream applied to wound bed prior to debridement Wound #3 Left Amputation  Site - Below Knee: Topical Lidocaine 4% cream applied to wound bed prior to debridement Primary Wound Dressing: Wound #1 Right Calcaneous: Santyl Ointment Wound #2 Right Toe Third: Santyl Ointment Wound #3 Left Amputation Site - Below Knee: Santyl Ointment Secondary Dressing: Wound #1 Right Calcaneous: Gauze and Kerlix/Conform Wound #2 Right Toe Third: Gauze and Kerlix/Conform Wound #3 Left Amputation Site - Below Knee: Gauze and Kerlix/Conform Dressing Change Frequency: Wound #1 Right Calcaneous: Change dressing every day. Wound #2 Right Toe Third: Change dressing every day. Wound #3 Left Amputation Site - Below Knee: Change dressing every day. Follow-up Appointments: Wound #1 Right Calcaneous: Return Appointment in 1 week. Wound #2 Right Toe Third: Return Appointment in 1 week. 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 Off-Loading: Wound #1 Right Calcaneous: Open toe surgical shoe to: - right foot Levario, Mariabella ([id number]) Other: - Offloading boot at night right foot Wound #2 Right Toe Third: Open toe surgical shoe to: - right foot Other: - Offloading boot at night right foot Wound #3 Left Amputation Site - Below Knee: Open toe surgical shoe to: - right foot Other: - Offloading boot at night right foot Home Health: Wound #1 Right Calcaneous: Initiate Home Health for Skilled Nursing - Continue [facility] Continue  Home Health Visits Home Health Nurse may visit PRN to address patient s wound care needs. FACE TO FACE ENCOUNTER: MEDICARE and MEDICAID PATIENTS: I certify that this patient is under my care and that I had a face-to-face encounter that meets the physician face-to-face encounter requirements with this patient on this date. The encounter with the patient was in whole or in part for the following MEDICAL CONDITION: (primary reason for Home Healthcare) MEDICAL NECESSITY: I certify, that based on my findings, NURSING services are  a medically necessary home health service. HOME BOUND STATUS: I certify that my clinical findings support that this patient is homebound (i.e., Due to illness or injury, pt requires aid of supportive devices such as crutches, cane, wheelchairs, walkers, the use of special transportation or the assistance of another person to leave their place of residence. There is a normal inability to leave the home and doing so requires considerable and taxing effort. Other absences are for medical reasons / religious services and are infrequent or of short duration when for other reasons). If current dressing causes regression in wound condition, may D/C ordered dressing product/s and apply Normal Saline Moist Dressing daily until next Wound Healing Center / Other MD appointment. Notify Wound Healing Center of regression in wound condition at [phone]. Please direct any NON-WOUND related issues/requests for orders to patient's Primary Care Physician Wound #2 Right Toe Third: Initiate Home Health for Skilled Nursing - Continue [facility] Continue Home Health Visits Home Health Nurse may visit PRN to address patient s wound care needs. FACE TO FACE ENCOUNTER: MEDICARE and MEDICAID PATIENTS: I certify that this patient is under my care and that I had a face-to-face encounter that meets the physician face-to-face encounter requirements with this patient on this date. The encounter with the patient was in whole or in part for the following MEDICAL CONDITION: (primary reason for Home Healthcare) MEDICAL NECESSITY: I certify, that based on my findings, NURSING services are a medically necessary home health service. HOME BOUND STATUS: I certify that my clinical findings support that this patient is homebound (i.e., Due to illness or injury, pt requires aid of supportive devices such as crutches, cane, wheelchairs, walkers, the use of special transportation or the assistance of another person to leave their place  of residence. There is a normal inability to leave the home and doing so requires considerable and taxing effort. Other absences are for medical reasons / religious services and are infrequent or of short duration when for other reasons). If current dressing causes regression in wound condition, may D/C ordered dressing product/s and apply Normal Saline Moist Dressing daily until next Wound Healing Center / Other MD appointment. Notify Wound Healing Center of regression in wound condition at [phone]. Please direct any NON-WOUND related issues/requests for orders to patient's Primary Care Physician Wound #3 Left Amputation Site - Below Knee: Initiate Home Health for Skilled Nursing - Continue [facility] Continue Home Health Visits Home Health Nurse may visit PRN to address patient s wound care needs. FACE TO FACE ENCOUNTER: MEDICARE and MEDICAID PATIENTS: I certify that this patient is under my care and that I had a face-to-face encounter that meets the physician face-to-face encounter requirements with this patient on this date. The encounter with the patient was in whole or in part for the [facility], [place] ([id number]) following MEDICAL CONDITION: (primary reason for Home Healthcare) MEDICAL NECESSITY: I certify, that based on my findings, NURSING services are a medically necessary home health service. HOME BOUND STATUS: I certify that  my clinical findings support that this patient is homebound (i.e., Due to illness or injury, pt requires aid of supportive devices such as crutches, cane, wheelchairs, walkers, the use of special transportation or the assistance of another person to leave their place of residence. There is a normal inability to leave the home and doing so requires considerable and taxing effort. Other absences are for medical reasons / religious services and are infrequent or of short duration when for other reasons). If current dressing causes regression in wound condition,  may D/C ordered dressing product/s and apply Normal Saline Moist Dressing daily until next Wound Healing Center / Other MD appointment. Notify Wound Healing Center of regression in wound condition at [phone]. Please direct any NON-WOUND related issues/requests for orders to patient's Primary Care Physician Medications-please add to medication list.: Wound #1 Right Calcaneous: Santyl Enzymatic Ointment Other: - OTC multivitamin with zinc, copper, and selenium Wound #2 Right Toe Third: Santyl Enzymatic Ointment Other: - OTC multivitamin with zinc, copper, and selenium Wound #3 Left Amputation Site - Below Knee: Santyl Enzymatic Ointment Other: - OTC multivitamin with zinc, copper, and selenium Follow-Up Appointments: A follow-up appointment should be scheduled. A Patient Clinical Summary of Care was provided to HS Continuation of the Santyl ointment to all the wounds and appropriate application of a shrinker to the left BKA stump. she will continue to see me at weekly intervals. Electronic Signature(s) Signed: 03/11/2015 11:19:08 AM By: Renee Harder RN Signed: 03/12/2015 4:45:54 PM By: Evlyn Kanner MD, FACS Previous Signature: 03/04/2015 12:36:02 PM Version By: Evlyn Kanner MD, FACS Entered By: Renee Harder on 03/11/2015 11:19:08 [name] ([id number]) -------------------------------------------------------------------------------- SuperBill Details Patient Name: [name] Date of Service: 03/04/2015 Medical Record Number: [id number] Patient Account Number: [id number] Date of Birth/Sex: June 07, 1921 (81 y.o. 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 L97.812 Non-pressure chronic ulcer of other part of right lower leg with fat layer exposed Disruption of external operation (surgical) wound, not elsewhere classified, initial T81.31XA encounter Facility Procedures CPT4: Description Modifier Quantity Code [id number] 11042 - DEB SUBQ TISSUE 20 SQ CM/< 1 ICD-10 Description Diagnosis E11.621 Type 2 diabetes mellitus with foot ulcer I70.235 Atherosclerosis of native arteries of right leg with ulceration of other part of  foot Z89.512 Acquired absence of left leg below knee 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

## 2015-03-04 NOTE — Progress Notes (Signed)
[name] ([id number]) Visit Report for 03/04/2015 Arrival Information Details Patient Name: [name], [name] Date of Service: 03/04/2015 9:30 AM Medical Record Number: [id number] Patient Account Number: [id number] Date of Birth/Sex: Feb 22, 1921 (81 y.o. Female) Treating RN: Curtis Sites Primary Care Physician: Dorothey Baseman Other Clinician: Referring Physician: Dorothey Baseman Treating Physician/Extender: Rudene Re in Treatment: 3 Visit Information History Since Last Visit Added or deleted any medications: No Patient Arrived: Wheel Chair Any new allergies or adverse reactions: No Arrival Time: 09:42 Had a fall or experienced change in No Accompanied By: granddaughter activities of daily living that may affect Transfer Assistance: Manual risk of falls: Patient Identification Verified: Yes Signs or symptoms of abuse/neglect since last No Secondary Verification Process Yes visito Completed: Hospitalized since last visit: No Patient Has Alerts: Yes Pain Present Now: No Patient Alerts: DMII Electronic Signature(s) Signed: 03/04/2015 2:45:57 PM By: Curtis Sites Entered By: Curtis Sites on 03/04/2015 09:43:13 [name] ([id number]) -------------------------------------------------------------------------------- Encounter Discharge Information Details Patient Name: [name] Date of Service: 03/04/2015 9:30 AM Medical Record Number: [id number] Patient Account Number: [id number] Date of Birth/Sex: [birth year] (81 y.o. Female) Treating RN: Primary Care Physician: Terance Hart, DAVID Other Clinician: Referring Physician: Terance Hart, DAVID Treating Physician/Extender: Rudene Re in Treatment: 3 Encounter Discharge Information Items Discharge Pain Level: 0 Discharge Condition: Stable Ambulatory Status: Wheelchair Discharge Destination: Home Transportation: Private Auto Accompanied By: granddaughter Schedule Follow-up Appointment: Yes Medication  Reconciliation completed and provided to Patient/Care No Mayleen Borrero: Provided on Clinical Summary of Care: 03/04/2015 Form Type Recipient Paper Patient HS Electronic Signature(s) Signed: 03/04/2015 2:45:57 PM By: Curtis Sites Previous Signature: 03/04/2015 10:35:30 AM Version By: Gwenlyn Perking Entered By: Curtis Sites on 03/04/2015 10:44:39 [name] ([id number]) -------------------------------------------------------------------------------- Lower Extremity Assessment Details Patient Name: [name] Date of Service: 03/04/2015 9:30 AM Medical Record Number: [id number] Patient Account Number: [id number] Date of Birth/Sex: 12-13-1920 (81 y.o. Female) Treating RN: Curtis Sites Primary Care Physician: Dorothey Baseman Other Clinician: Referring Physician: Dorothey Baseman Treating Physician/Extender: Rudene Re in Treatment: 3 Vascular Assessment Pulses: Posterior Tibial Palpable: [Right:No] Dorsalis Pedis Palpable: [Right:No] Extremity colors, hair growth, and conditions: Extremity Color: [Right:Normal] Hair Growth on Extremity: [Right:No] Temperature of Extremity: [Right:Cool] Capillary Refill: [Right:< 3 seconds] Toe Nail Assessment Left: Right: Thick: Yes Discolored: No Deformed: No Improper Length and Hygiene: No Electronic Signature(s) Signed: 03/04/2015 2:45:57 PM By: Curtis Sites Entered By: Curtis Sites on 03/04/2015 09:59:35 [name] ([id number]) -------------------------------------------------------------------------------- Multi Wound Chart Details Patient Name: [name] Date of Service: 03/04/2015 9:30 AM Medical Record Number: [id number] Patient Account Number: [id number] Date of Birth/Sex: 11-Aug-1921 (81 y.o. 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] Comorbid History: [1:Arrhythmia, Deep Vein Thrombosis, Peripheral Arterial Disease, Type II Arterial Disease, Type II Arterial Disease, Type II Diabetes] [2:Arrhythmia, Deep Vein Thrombosis, Peripheral Diabetes] [3:Arrhythmia, Deep Vein Thrombosis,  Peripheral Diabetes] Date Acquired: [1:11/04/2014] [2:11/04/2014] [3:01/03/2015] Weeks of Treatment: [1:3] [2:3] [3:2] Wound Status: [1:Open] [2:Open] [3:Open] Measurements L x W x D 2x2.3x0.1 [2:1.8x1.1x0.1] [3:2x6.2x0.3] (cm) Area (cm) : [1:3.613] [2:1.555] [3:9.739] Volume (cm) : [1:0.361] [2:0.156] [3:2.922] % Reduction in  Area: [1:34.30%] [2:-65.10%] [3:6.10%] % Reduction in Volume: 34.40% [2:-66.00%] [3:29.50%] Classification: [1:Full Thickness Without Exposed Support Structures] [2:Full Thickness Without Exposed Support Structures] [3:Full Thickness Without Exposed Support Structures] HBO Classification: [1:Grade 1] [2:Grade 1] [3:Grade 2] Exudate Amount: [1:Large] [2:Small] [3:Large] Exudate Type: [1:Purulent] [2:Purulent] [3:Serous] Exudate Color: [1:yellow, brown, green] [2:yellow, brown, green] [3:amber] Wound Margin: [1:Flat and Intact] [2:Flat and Intact] [3:Distinct, outline attached] Granulation Amount: [1:None Present (0%)] [2:None Present (0%)] [3:None Present (0%)] Necrotic Amount: [1:Large (67-100%)] [2:Large (67-100%)] [3:Large (67-100%)] Necrotic Tissue: [1:Eschar, Adherent Slough Eschar, Adherent [employer], Adherent Slough] Exposed  Structures: Pleak, Dakota ([id number]) Fascia: No Fascia: No Fascia: No Fat: No Fat: No Fat: No Tendon: No Tendon: No Tendon: No Muscle: No Muscle: No Muscle: No Joint: No Joint: No Joint: No Bone: No Bone: No Bone: No Limited to Skin Limited to Skin Limited to Skin Breakdown Breakdown Breakdown Epithelialization: None None None Periwound Skin Texture: Edema: No Edema: No Edema: No Excoriation: No Excoriation: No Excoriation: No Induration: No Induration: No Induration: No Callus: No Callus: No Callus: No Crepitus: No Crepitus: No Crepitus: No Fluctuance: No Fluctuance: No Fluctuance: No Friable: No Friable: No Friable: No Rash: No Rash: No Rash: No Scarring: No Scarring: No Scarring: No Periwound Skin Maceration: No Maceration: No Maceration: No Moisture: Moist: No Moist: No Moist: No Dry/Scaly: No Dry/Scaly: No Dry/Scaly: No Periwound Skin Color: Erythema: Yes Erythema: Yes Atrophie Blanche: No Atrophie Blanche: No Atrophie Blanche: No Cyanosis: No Cyanosis: No Cyanosis: No Ecchymosis: No Ecchymosis: No Ecchymosis: No Erythema: No Hemosiderin Staining: No Hemosiderin Staining: No Hemosiderin Staining: No Mottled: No Mottled: No Mottled: No Pallor: No Pallor: No Pallor: No Rubor: No Rubor: No Rubor: No Erythema Location: Circumferential Circumferential N/A Temperature: No Abnormality No Abnormality No Abnormality Tenderness on Yes Yes Yes Palpation: Wound Preparation: Ulcer Cleansing: Ulcer Cleansing: Ulcer Cleansing: Rinsed/Irrigated with Rinsed/Irrigated with Rinsed/Irrigated with Saline Saline Saline Topical Anesthetic Topical Anesthetic Topical Anesthetic Applied: Other: lidocaine Applied: Other: lidocaine Applied: Other: lidocaine 4% 4% 4% Treatment Notes Electronic Signature(s) Signed: 03/04/2015 4:43:53 PM By: Renee Harder RN Entered By: Renee Harder on 03/04/2015 10:11:28 [name]  ([id number]) -------------------------------------------------------------------------------- Multi-Disciplinary Care Plan Details Patient Name: [name] Date of Service: 03/04/2015 9:30 AM Medical Record Number: [id number] Patient Account Number: [id number] Date of Birth/Sex: 1921/05/01 (81 y.o. Female) Treating RN: Renee Harder Primary Care Physician: Dorothey Baseman Other Clinician: Referring Physician: Dorothey Baseman Treating Physician/Extender: Rudene Re in Treatment: 3 Active Inactive Abuse / Safety / Falls / Self Care Management Nursing Diagnoses: Impaired physical mobility Potential for falls Goals: Patient will remain injury free Date Initiated: 02/11/2015 Goal Status: Active Patient/caregiver will verbalize understanding of skin care regimen Date Initiated: 02/11/2015 Goal Status: Active Patient/caregiver will verbalize/demonstrate measures taken to prevent injury and/or falls Date Initiated: 02/11/2015 Goal Status: Active Patient/caregiver will verbalize/demonstrate understanding of what to do in case of emergency Date Initiated: 02/11/2015 Goal Status: Active Interventions: Assess fall risk on admission and as needed Provide education on fall prevention Provide education on safe transfers Treatment Activities: Patient referred to home care : 03/04/2015 Notes: Nutrition Nursing Diagnoses: Imbalanced nutrition Potential for alteratiion in Nutrition/Potential for imbalanced nutrition Piechocki, Lashundra ([id number]) Goals: Patient/caregiver verbalizes understanding of need to maintain therapeutic glucose control per primary care physician Date Initiated: 02/11/2015 Goal Status: Active Patient/caregiver will maintain therapeutic glucose control Date Initiated: 02/11/2015 Goal Status: Active Interventions: Assess HgA1c results as ordered upon admission and as needed Provide education on elevated blood sugars and impact on wound healing Provide  education  on nutrition Treatment Activities: Obtain HgA1c : 03/04/2015 Notes: Orientation to the Wound Care Program Nursing Diagnoses: Knowledge deficit related to the wound healing center program Goals: Patient/caregiver will verbalize understanding of the Wound Healing Center Program Date Initiated: 02/11/2015 Goal Status: Active Interventions: Provide education on orientation to the wound center Notes: Wound/Skin Impairment Nursing Diagnoses: Impaired tissue integrity Knowledge deficit related to ulceration/compromised skin integrity Goals: Patient/caregiver will verbalize understanding of skin care regimen Date Initiated: 02/11/2015 Goal Status: Active Ulcer/skin breakdown will heal within 14 weeks Date Initiated: 02/11/2015 RAWAN, RIENDEAU ([id number]) Goal Status: Active Interventions: Assess patient/caregiver ability to obtain necessary supplies Assess patient/caregiver ability to perform ulcer/skin care regimen upon admission and as needed Assess ulceration(s) every visit Provide education on ulcer and skin care Treatment Activities: Skin care regimen initiated : 03/04/2015 Topical wound management initiated : 03/04/2015 Notes: Electronic Signature(s) Signed: 03/04/2015 4:43:53 PM By: Renee Harder RN Entered By: Renee Harder on 03/04/2015 10:11:10 [name] ([id number]) -------------------------------------------------------------------------------- Patient/Caregiver Education Details Patient Name: [name] Date of Service: 03/04/2015 9:30 AM Medical Record Number: [id number] Patient Account Number: [id number] Date of Birth/Gender: April 18, 1921 (81 y.o. Female) Treating RN: Renee Harder Primary Care Physician: Dorothey Baseman Other Clinician: Referring Physician: Dorothey Baseman Treating Physician/Extender: Rudene Re in Treatment: 3 Education Assessment Education Provided To: Patient Education Topics Provided Safety: Methods: Explain/Verbal Responses:  State content correctly Wound Debridement: Methods: Explain/Verbal Responses: State content correctly Wound/Skin Impairment: Methods: Explain/Verbal Responses: State content correctly Electronic Signature(s) Signed: 03/04/2015 4:43:53 PM By: Renee Harder RN Entered By: Renee Harder on 03/04/2015 10:27:01 [name] ([id number]) -------------------------------------------------------------------------------- Wound Assessment Details Patient Name: [name] Date of Service: 03/04/2015 9:30 AM Medical Record Number: [id number] Patient Account Number: [id number] Date of Birth/Sex: 11/18/1920 (81 y.o. 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 Wound Open Wounding Event: Gradually Appeared Status: Date Acquired: 11/04/2014 Comorbid Arrhythmia, Deep Vein Thrombosis, Weeks Of Treatment: 3 History: Peripheral Arterial Disease, Type II Clustered Wound: No Diabetes Photos Photo Uploaded By: Curtis Sites on 03/04/2015 12:45:53 Wound Measurements Length: (cm) 2 Width: (cm) 2.3 Depth: (cm) 0.1 Area: (cm) 3.613 Volume: (cm) 0.361 % Reduction in Area: 34.3% % Reduction in Volume: 34.4% Epithelialization: None Tunneling: No Undermining: No Wound Description Full Thickness Without Foul Odor Aft Classification: Exposed Support Structures Diabetic Severity Grade 1 (Wagner): Wound Margin: Flat and Intact Exudate Amount: Large Exudate Type: Purulent Exudate Color: yellow, brown, green er Cleansing: No Wound Bed Granulation Amount: None Present (0%) Exposed Structure [place], Mikhaila ([id number]) Necrotic Amount: Large (67-100%) Fascia Exposed: No Necrotic Quality: Eschar, Adherent Slough Fat Layer Exposed:  No Tendon Exposed: No Muscle Exposed: No Joint Exposed: No Bone Exposed: No Limited to Skin Breakdown Periwound Skin Texture Texture Color No Abnormalities Noted: No No Abnormalities Noted: No Callus: No Atrophie Blanche: No Crepitus: No Cyanosis: No Excoriation: No Ecchymosis: No Fluctuance: No Erythema: Yes Friable: No Erythema Location: Circumferential Induration: No Hemosiderin Staining: No Localized Edema: No Mottled: No Rash: No Pallor: No Scarring: No Rubor: No Moisture Temperature / Pain No Abnormalities Noted: No Temperature: No Abnormality Dry / Scaly: No Tenderness on Palpation: Yes Maceration: No Moist: No Wound Preparation Ulcer Cleansing: Rinsed/Irrigated with Saline Topical Anesthetic Applied: Other: lidocaine 4%, Treatment Notes Wound #1 (Right Calcaneous) 1. Cleansed with: Clean wound with Normal Saline 2. Anesthetic Topical Lidocaine 4% cream to  wound bed prior to debridement 4. Dressing Applied: Santyl Ointment 5. Secondary Dressing Applied Kerlix/Conform Non-Adherent pad 7. Secured with Tape Notes plain packing strip [place], Perle ([id number]) Electronic Signature(s) Signed: 03/04/2015 2:45:57 PM By: Curtis Sites Entered By: Curtis Sites on 03/04/2015 10:01:38 [name] ([id number]) -------------------------------------------------------------------------------- Wound Assessment Details Patient Name: [name] Date of Service: 03/04/2015 9:30 AM Medical Record Number: [id number] Patient Account Number: [id number] Date of Birth/Sex: 05-21-1921 (81 y.o. Female) Treating RN: Curtis Sites Primary Care Physician: Dorothey Baseman Other Clinician: Referring Physician: Terance Hart, DAVID Treating Physician/Extender: Rudene Re in Treatment: 3 Wound Status Wound Number: 2 Primary Arterial Insufficiency Ulcer Etiology: Wound Location: Right Toe Third Wound Open Wounding Event: Gradually Appeared Status: Date  Acquired: 11/04/2014 Comorbid Arrhythmia, Deep Vein Thrombosis, Weeks Of Treatment: 3 History: Peripheral Arterial Disease, Type II Clustered Wound: No Diabetes Photos Photo Uploaded By: Curtis Sites on 03/04/2015 12:46:50 Wound Measurements Length: (cm) 1.8 % Reduction i Width: (cm) 1.1 % Reduction i Depth: (cm) 0.1 Epithelializa Area: (cm) 1.555 Tunneling: Volume: (cm) 0.156 Undermining: n Area: -65.1% n Volume: -66% tion: None No No Wound Description Full Thickness Without Foul Odor Aft Classification: Exposed Support Structures Diabetic Severity Grade 1 (Wagner): Wound Margin: Flat and Intact Exudate Amount: Small Exudate Type: Purulent Exudate Color: yellow, brown, green er Cleansing: No Wound Bed Granulation Amount: None Present (0%) Exposed Structure Sian, Caeli ([id number]) Necrotic Amount: Large (67-100%) Fascia Exposed: No Necrotic Quality: Eschar, Adherent Slough Fat Layer Exposed: No Tendon Exposed: No Muscle Exposed: No Joint Exposed: No Bone Exposed: No Limited to Skin Breakdown Periwound Skin Texture Texture Color No Abnormalities Noted: No No Abnormalities Noted: No Callus: No Atrophie Blanche: No Crepitus: No Cyanosis: No Excoriation: No Ecchymosis: No Fluctuance: No Erythema: Yes Friable: No Erythema Location: Circumferential Induration: No Hemosiderin Staining: No Localized Edema: No Mottled: No Rash: No Pallor: No Scarring: No Rubor: No Moisture Temperature / Pain No Abnormalities Noted: No Temperature: No Abnormality Dry / Scaly: No Tenderness on Palpation: Yes Maceration: No Moist: No Wound Preparation Ulcer Cleansing: Rinsed/Irrigated with Saline Topical Anesthetic Applied: Other: lidocaine 4%, Treatment Notes Wound #2 (Right Toe Third) 1. Cleansed with: Clean wound with Normal Saline 2. Anesthetic Topical Lidocaine 4% cream to wound bed prior to debridement 4. Dressing Applied: Santyl Ointment 5.  Secondary Dressing Applied Kerlix/Conform Non-Adherent pad 7. Secured with Tape Notes plain packing strip [place], Eriana ([id number]) Electronic Signature(s) Signed: 03/04/2015 2:45:57 PM By: Curtis Sites Entered By: Curtis Sites on 03/04/2015 10:01:59 [name] ([id number]) -------------------------------------------------------------------------------- Wound Assessment Details Patient Name: [name] Date of Service: 03/04/2015 9:30 AM Medical Record Number: [id number] Patient Account Number: [id number] Date of Birth/Sex: 11-Feb-1921 (81 y.o. Female) Treating RN: Curtis Sites Primary Care Physician: Dorothey Baseman Other Clinician: Referring Physician: Terance Hart, DAVID Treating Physician/Extender: Rudene Re in Treatment: 3 Wound Status Wound Number: 3 Primary Dehisced Wound Etiology: Wound Location: Left Amputation Site - Below Knee Wound Open Status: Wounding Event: Surgical Injury Comorbid Arrhythmia, Deep Vein Thrombosis, Date Acquired: 01/03/2015 History: Peripheral Arterial Disease, Type II Weeks Of Treatment: 2 Diabetes Clustered Wound: No Photos Photo Uploaded By: Curtis Sites on 03/04/2015 12:47:23 Wound Measurements Length: (cm) 2 Width: (cm) 6.2 Depth: (cm) 0.3 Area: (cm) 9.739 Volume: (cm) 2.922 % Reduction in Area: 6.1% % Reduction in Volume: 29.5% Epithelialization: None Tunneling: No Undermining: No Wound Description Full Thickness Without Classification: Exposed Support Structures Diabetic Severity Grade 2 (Wagner): Wound Margin: Distinct, outline attached Exudate Amount: Large Exudate Type: Serous Exudate Color: amber Foul Odor After Cleansing:  No Wound Bed Granulation Amount: None Present (0%) Exposed Structure Mac, Hortensia ([id number]) Necrotic Amount: Large (67-100%) Fascia Exposed: No Necrotic Quality: Eschar, Adherent Slough Fat Layer Exposed: No Tendon Exposed: No Muscle Exposed: No Joint Exposed:  No Bone Exposed: No Limited to Skin Breakdown Periwound Skin Texture Texture Color No Abnormalities Noted: No No Abnormalities Noted: No Callus: No Atrophie Blanche: No Crepitus: No Cyanosis: No Excoriation: No Ecchymosis: No Fluctuance: No Erythema: No Friable: No Hemosiderin Staining: No Induration: No Mottled: No Localized Edema: No Pallor: No Rash: No Rubor: No Scarring: No Temperature / Pain Moisture Temperature: No Abnormality No Abnormalities Noted: No Tenderness on Palpation: Yes Dry / Scaly: No Maceration: No Moist: No Wound Preparation Ulcer Cleansing: Rinsed/Irrigated with Saline Topical Anesthetic Applied: Other: lidocaine 4%, Treatment Notes Wound #3 (Left Amputation Site - Below Knee) 1. Cleansed with: Clean wound with Normal Saline 2. Anesthetic Topical Lidocaine 4% cream to wound bed prior to debridement 4. Dressing Applied: Santyl Ointment 5. Secondary Dressing Applied Kerlix/Conform Non-Adherent pad 7. Secured with Tape Notes plain packing strip Wortmann, Josephina ([id number]) Electronic Signature(s) Signed: 03/04/2015 2:45:57 PM By: Curtis Sitesorthy, Joanna Entered By: Curtis Sitesorthy, Joanna on 03/04/2015 10:02:24 [name] NationsSILETZKY, Alysse ([id number]) -------------------------------------------------------------------------------- Vitals Details Patient Name: [name] NationsSILETZKY, Shanyah Date of Service: 03/04/2015 9:30 AM Medical Record Number: [id number] Patient Account Number: [id number] Date of Birth/Sex: 09/24/1921 (81 y.o. Female) Treating RN: Curtis Sitesorthy, Joanna Primary Care Physician: Dorothey BasemanBRONSTEIN, DAVID Other Clinician: Referring Physician: Terance HartBRONSTEIN, DAVID Treating Physician/Extender: Rudene ReBritto, Errol Weeks in Treatment: 3 Vital Signs Time Taken: 09:45 Temperature (F): 97.6 Height (in): 62 Pulse (bpm): 53 Weight (lbs): 155 Respiratory Rate (breaths/min): 16 Body Mass Index (BMI): 28.3 Blood Pressure (mmHg): 108/54 Reference Range: 80 - 120 mg / dl Electronic  Signature(s) Signed: 03/04/2015 2:45:57 PM By: Curtis Sitesorthy, Joanna Entered By: Curtis Sitesorthy, Joanna on 03/04/2015 09:48:03

## 2015-03-11 ENCOUNTER — Encounter: Payer: Medicare Other | Admitting: Surgery

## 2015-03-11 DIAGNOSIS — L97812 Non-pressure chronic ulcer of other part of right lower leg with fat layer exposed: Secondary | ICD-10-CM | POA: Diagnosis not present

## 2015-03-11 NOTE — Progress Notes (Addendum)
ESSA, WENK ([id number]) Visit Report for 03/11/2015 Chief Complaint Document Details Patient Name: [name], [name] Date of Service: 03/11/2015 10:15 AM Medical Record Number: [id number] Patient Account Number: [id number] Date of Birth/Sex: April 15, 1921 (81 y.o. Female) Treating RN: Primary Care Physician: Terance Hart, DAVID Other Clinician: Referring Physician: Terance Hart, DAVID Treating Physician/Extender: Rudene Re in Treatment: 4 Information Obtained from: Patient Chief Complaint Patient presents to the wound care center for a consult due non healing wound. 81 year old patient with comes with a history of a ulcerated area to the right third toe and the right heel which she's had for about 2 months. Electronic Signature(s) Signed: 03/11/2015 12:20:02 PM By: Evlyn Kanner MD, FACS Entered By: Evlyn Kanner on 03/11/2015 11:09:30 [name] ([id number]) -------------------------------------------------------------------------------- Debridement Details Patient Name: [name] Date of Service: 03/11/2015 10:15 AM Medical Record Number: [id number] Patient Account Number: [id number] Date of Birth/Sex: 1921/07/06 (81 y.o. Female) Treating RN: Primary Care Physician: Terance Hart, DAVID Other Clinician: Referring Physician: Terance Hart, DAVID Treating Physician/Extender: Rudene Re in Treatment: 4 Debridement Performed for Wound #1 Right Calcaneous Assessment: Performed By: Physician Tristan Schroeder., MD Debridement: Debridement Pre-procedure Yes Verification/Time Out Taken: Start Time: 10:55 Pain Control: Lidocaine 4% Topical Solution Level: Skin/Subcutaneous Tissue Total Area Debrided (L x 2 (cm) x 3 (cm) = 6 (cm) W): Tissue and other Viable, Non-Viable, Fibrin/Slough, Skin, Subcutaneous material debrided: Instrument: Curette Bleeding: Minimum Hemostasis Achieved: Pressure End Time: 10:57 Procedural Pain: 2 Post Procedural Pain: 0 Response to  Treatment: Procedure was tolerated well Post Debridement Measurements of Total Wound Length: (cm) 2 Width: (cm) 3 Depth: (cm) 0.2 Volume: (cm) 0.942 Electronic Signature(s) Signed: 03/11/2015 12:20:02 PM By: Evlyn Kanner MD, FACS Entered By: Evlyn Kanner on 03/11/2015 11:08:21 [name] ([id number]) -------------------------------------------------------------------------------- Debridement Details Patient Name: [name] Date of Service: 03/11/2015 10:15 AM Medical Record Number: [id number] Patient Account Number: [id number] Date of Birth/Sex: [birth year] (81 y.o. Female) Treating RN: Primary Care Physician: Terance Hart, DAVID Other Clinician: Referring Physician: Terance Hart, DAVID Treating Physician/Extender: Rudene Re in Treatment: 4 Debridement Performed for Wound #2 Right Toe Third Assessment: Performed By: Physician Tristan Schroeder., MD Debridement: Debridement Pre-procedure Yes Verification/Time Out Taken: Start Time: 10:59 Pain Control: Lidocaine 4% Topical Solution Level: Skin/Subcutaneous Tissue Total Area Debrided (L x 1.3 (cm) x 1 (cm) = 1.3 (cm) W): Tissue and other Non-Viable, Fibrin/Slough, Skin, Subcutaneous material debrided: Instrument: Curette Bleeding: Minimum Hemostasis Achieved: Pressure End Time: 10:59 Procedural Pain: 2 Post Procedural Pain: 0 Response to Treatment: Procedure was tolerated well Post Debridement Measurements of Total Wound Length: (cm) 1.3 Width: (cm) 1 Depth: (cm) 0.1 Volume: (cm) 0.102 Electronic Signature(s) Signed: 03/11/2015 12:20:02 PM By: Evlyn Kanner MD, FACS Entered By: Evlyn Kanner on 03/11/2015 11:08:41 [name] ([id number]) -------------------------------------------------------------------------------- Debridement Details Patient Name: [name] Date of Service: 03/11/2015 10:15 AM Medical Record Number: [id number] Patient Account Number: [id number] Date of Birth/Sex: 08-07-1921  (81 y.o. Female) Treating RN: Primary Care Physician: Terance Hart, DAVID Other Clinician: Referring Physician: Terance Hart, DAVID Treating Physician/Extender: Rudene Re in Treatment: 4 Debridement Performed for Wound #3 Left Amputation Site - Below Knee Assessment: Performed By: Physician Tristan Schroeder., MD Debridement: Debridement Pre-procedure Yes Verification/Time Out Taken: Start Time: 10:52 Pain Control: Lidocaine 4% Topical Solution Level: Skin/Subcutaneous Tissue Total Area Debrided (L x 1.2 (cm) x 5.7 (cm) = 6.84 (cm) W): Tissue and other Non-Viable, Fibrin/Slough, Subcutaneous material debrided: Instrument: Curette Bleeding: Minimum Hemostasis Achieved: Pressure End Time: 10:55 Procedural Pain: 2 Post Procedural Pain: 0 Response to Treatment: Procedure was tolerated well  Post Debridement Measurements of Total Wound Length: (cm) 1.2 Width: (cm) 5.7 Depth: (cm) 1.5 Volume: (cm) 8.058 Electronic Signature(s) Signed: 03/11/2015 12:20:02 PM By: Evlyn Kanner MD, FACS Entered By: Evlyn Kanner on 03/11/2015 11:08:59 [name] ([id number]) -------------------------------------------------------------------------------- Debridement Details Patient Name: [name] Date of Service: 03/11/2015 10:15 AM Medical Record Number: [id number] Patient Account Number: [id number] Date of Birth/Sex: 09/21/1921 (81 y.o. Female) Treating RN: Primary Care Physician: Terance Hart, DAVID Other Clinician: Referring Physician: Terance Hart, DAVID Treating Physician/Extender: Rudene Re in Treatment: 4 Debridement Performed for Wound #4 Right,Medial Toe Third Assessment: Performed By: Physician Tristan Schroeder., MD Debridement: Debridement Pre-procedure Yes Verification/Time Out Taken: Start Time: 10:58 Pain Control: Lidocaine 4% Topical Solution Total Area Debrided (L x 0.4 (cm) x 0.2 (cm) = 0.08 (cm) W): Tissue and other Viable, Non-Viable,  Fibrin/Slough, Subcutaneous material debrided: Instrument: Curette Bleeding: None End Time: 10:59 Procedural Pain: 2 Post Procedural Pain: 0 Response to Treatment: Procedure was tolerated well Post Debridement Measurements of Total Wound Length: (cm) 0.4 Width: (cm) 0.2 Depth: (cm) 0.2 Volume: (cm) 0.013 Electronic Signature(s) Signed: 03/11/2015 12:20:02 PM By: Evlyn Kanner MD, FACS Entered By: Evlyn Kanner on 03/11/2015 11:09:16 [name] ([id number]) -------------------------------------------------------------------------------- HPI Details Patient Name: [name] Date of Service: 03/11/2015 10:15 AM Medical Record Number: [id number] Patient Account Number: [id number] Date of Birth/Sex: Jan 18, 1921 (82 y.o. Female) Treating RN: Primary Care Physician: Terance Hart, DAVID Other Clinician: Referring Physician: Terance Hart, DAVID Treating Physician/Extender: Rudene Re in Treatment: 4 History of Present Illness Location: right medial heel and right third toe Quality: Patient reports experiencing a dull pain to affected area(s). Severity: Patient states wound are getting worse. Duration: Patient has had the wound for > 3 months prior to seeking treatment at the wound center Timing: Pain in wound is Intermittent (comes and goes Context: The wound appeared gradually over time Modifying Factors: Consults to this date include:vascular surgeon and several recent surgeries. Associated Signs and Symptoms: Patient reports having foul odor and drainage from the left below-knee amputation site. HPI Description: A pleasant 81 year old patient who is known to have diabetes mellitus for several years has recently gone through a series of operations with the vascular surgeon Dr. Gilda Crease. In January and February she had several surgeries on the left lower extremity with an attempt to have limb salvage for gangrenous changes of her forefoot. 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 On 01/07/2015 she also had a right lower extremity distal runoff with a angioplasty of the right anterior tibial artery to maximize her blood flow to the foot. She recently had her staples removed at Dr. Marijean Heath office on 01/31/2015 and at that time a right lower extremity duplex was done which showed patent vessels and a patent stent with distal occluded posterior tibial artery. Though the duplex was noncritical the recommendations from the PA at the vascular surgery office was that of a angiogram to be done but the patient said she would rather try some bone care before. The patient has received doxycycline and Cipro in the recent past and she takes oral medications for her diabetes. Other than that I reviewed her list of all her medications. No recent hemoglobin A1c has been done and no recent x-rays of the right foot have been done. 02/18/2015 -- x-ray of the right foot was done on 02/11/2015 and it shows no evidence of acute osteomyelitis of the third toe or of the calcaneus. She had gone  to the vascular surgery office and they had noted that there is dehiscence of the left part of the amputation site of the below-knee wound and they have asked [place] to kindly take over the care of this. There've been doing dressings for the right heel and right third toe. 02/25/2015 -- we have received notes from the vascular group who saw her last on 02/13/2015 and she was seen by the PA Ms. Cleda Daub. She had recommended that the patient continue to follow with [place] for wound care including management of the left BKA stump, which has had dehiscence of the lateral part. They will consider repeating a arterial duplex or angiogram if the right lower extremity does not heal within a reasonable period of time. Electronic Signature(s) Signed: 03/11/2015 12:20:02 PM By: Evlyn Kanner MD, FACS [place], Krisinda  ([id number]) Entered By: Evlyn Kanner on 03/11/2015 11:11:44 [name] ([id number]) -------------------------------------------------------------------------------- Physical Exam Details Patient Name: [name] Date of Service: 03/11/2015 10:15 AM Medical Record Number: [id number] Patient Account Number: [id number] Date of Birth/Sex: June 15, 1921 (81 y.o. Female) Treating RN: Primary Care Physician: Terance Hart, DAVID Other Clinician: Referring Physician: Terance Hart, DAVID Treating Physician/Extender: Rudene Re in Treatment: 4 Constitutional . Pulse regular. Respirations normal and unlabored. Afebrile. . Eyes Nonicteric. Reactive to light. Ears, Nose, Mouth, and Throat Lips, teeth, and gums WNL.Marland Kitchen Moist mucosa without lesions . Neck supple and nontender. No palpable supraclavicular or cervical adenopathy. Normal sized without goiter. Respiratory WNL. No retractions.. Cardiovascular no palpable ankle pulses on the right. No clubbing, cyanosis or edema. Integumentary (Hair, Skin) Her wounds on the stump of the left below-knee amputation look a little smaller and there is some subcutaneous debris which needs to be sharply debrided.. No crepitus or fluctuance. No peri-wound warmth or erythema. No masses.Marland Kitchen Psychiatric Judgement and insight Intact.. No evidence of depression, anxiety, or agitation.. Electronic Signature(s) Signed: 03/11/2015 12:20:02 PM By: Evlyn Kanner MD, FACS Entered By: Evlyn Kanner on 03/11/2015 11:13:17 [name] ([id number]) -------------------------------------------------------------------------------- Physician Orders Details Patient Name: [name] Date of Service: 03/11/2015 10:15 AM Medical Record Number: [id number] Patient Account Number: [id number] Date of Birth/Sex: 1920-10-11 (81 y.o. Female) Treating RN: Renee Harder Primary Care Physician: Dorothey Baseman Other Clinician: Referring Physician: Dorothey Baseman Treating  Physician/Extender: Rudene Re in Treatment: 4 Verbal / Phone Orders: Yes Clinician: Renee Harder Read Back and Verified: Yes Diagnosis Coding Wound Cleansing Wound #1 Right Calcaneous o Clean wound with Normal Saline. o Clean wound with Normal Saline. Wound #2 Right Toe Third o Clean wound with Normal Saline. o Clean wound with Normal Saline. Wound #3 Left Amputation Site - Below Knee o Clean wound with Normal Saline. o Clean wound with Normal Saline. Wound #4 Right,Medial Toe Third o Clean wound with Normal Saline. o Clean wound with Normal Saline. Anesthetic Wound #1 Right Calcaneous o Topical Lidocaine 4% cream applied to wound bed prior to debridement Wound #2 Right Toe Third o Topical Lidocaine 4% cream applied to wound bed prior to debridement Wound #3 Left Amputation Site - Below Knee o Topical Lidocaine 4% cream applied to wound bed prior to debridement Wound #4 Right,Medial Toe Third o Topical Lidocaine 4% cream applied to wound bed prior to debridement Primary Wound Dressing Wound #1 Right Calcaneous o Santyl Ointment Wound #2 Right Toe Third o Santyl Ointment [place], [place] ([id number]) Wound #3 Left Amputation Site - Below Knee o Santyl Ointment Wound #4 Right,Medial Toe Third o Santyl Ointment Secondary Dressing Wound #1 Right Calcaneous o Gauze and Kerlix/Conform Wound #2  Right Toe Third o Gauze and Kerlix/Conform Wound #3 Left Amputation Site - Below Knee o Gauze and Kerlix/Conform Wound #4 Right,Medial Toe Third o Gauze and Kerlix/Conform Dressing Change Frequency Wound #1 Right Calcaneous o Change dressing every day. Wound #2 Right Toe Third o Change dressing every day. Wound #3 Left Amputation Site - Below Knee o Change dressing every day. Wound #4 Right,Medial Toe Third o Change dressing every day. Follow-up Appointments Wound #1 Right Calcaneous o Return Appointment in 1  week. Wound #2 Right Toe Third o Return Appointment in 1 week. Wound #3 Left Amputation Site - Below Knee o Return Appointment in 1 week. Wound #4 Right,Medial Toe Third o Return Appointment in 1 week. Off-Loading Wound #1 Right Calcaneous [place], [place]leigh ([id number]) o Open toe surgical shoe to: - right foot o Other: - Offloading boot at night right foot Home Health Wound #1 Right Calcaneous o Initiate Home Health for Skilled Nursing - Continue Aldine Contes o Continue Home Health Visits o Home Health Nurse may visit PRN to address patientos wound care needs. o FACE TO FACE ENCOUNTER: MEDICARE and MEDICAID PATIENTS: I certify that this patient is under my care and that I had a face-to-face encounter that meets the physician face-to-face encounter requirements with this patient on this date. The encounter with the patient was in whole or in part for the following MEDICAL CONDITION: (primary reason for Home Healthcare) MEDICAL NECESSITY: I certify, that based on my findings, NURSING services are a medically necessary home health service. HOME BOUND STATUS: I certify that my clinical findings support that this patient is homebound (i.e., Due to illness or injury, pt requires aid of supportive devices such as crutches, cane, wheelchairs, walkers, the use of special transportation or the assistance of another person to leave their place of residence. There is a normal inability to leave the home and doing so requires considerable and taxing effort. Other absences are for medical reasons / religious services and are infrequent or of short duration when for other reasons). o If current dressing causes regression in wound condition, may D/C ordered dressing product/s and apply Normal Saline Moist Dressing daily until next Wound Healing Center / Other MD appointment. Notify Wound Healing Center of regression in wound condition at [phone]. o Please direct any NON-WOUND  related issues/requests for orders to patient's Primary Care Physician Wound #2 Right Toe Third o Initiate Home Health for Skilled Nursing - Continue Tomasa Hosteller Continue Home Health Visits o Home Health Nurse may visit PRN to address patientos wound care needs. o FACE TO FACE ENCOUNTER: MEDICARE and MEDICAID PATIENTS: I certify that this patient is under my care and that I had a face-to-face encounter that meets the physician face-to-face encounter requirements with this patient on this date. The encounter with the patient was in whole or in part for the following MEDICAL CONDITION: (primary reason for Home Healthcare) MEDICAL NECESSITY: I certify, that based on my findings, NURSING services are a medically necessary home health service. HOME BOUND STATUS: I certify that my clinical findings support that this patient is homebound (i.e., Due to illness or injury, pt requires aid of supportive devices such as crutches, cane, wheelchairs, walkers, the use of special transportation or the assistance of another person to leave their place of residence. There is a normal inability to leave the home and doing so requires considerable and taxing effort. Other absences are for medical reasons / religious services and are infrequent or of short duration when for other reasons). o  If current dressing causes regression in wound condition, may D/C ordered dressing product/s and apply Normal Saline Moist Dressing daily until next Wound Healing Center / Other MD appointment. Notify Wound Healing Center of regression in wound condition at [phone]. o Please direct any NON-WOUND related issues/requests for orders to patient's Primary Care Physician Wound #3 Left Amputation Site - Below Knee SAMYRIA, RUDIE ([id number]) o Initiate Home Health for Skilled Nursing - Continue Tomasa Hosteller Continue Home Health Visits o Home Health Nurse may visit PRN to address patientos wound care  needs. o FACE TO FACE ENCOUNTER: MEDICARE and MEDICAID PATIENTS: I certify that this patient is under my care and that I had a face-to-face encounter that meets the physician face-to-face encounter requirements with this patient on this date. The encounter with the patient was in whole or in part for the following MEDICAL CONDITION: (primary reason for Home Healthcare) MEDICAL NECESSITY: I certify, that based on my findings, NURSING services are a medically necessary home health service. HOME BOUND STATUS: I certify that my clinical findings support that this patient is homebound (i.e., Due to illness or injury, pt requires aid of supportive devices such as crutches, cane, wheelchairs, walkers, the use of special transportation or the assistance of another person to leave their place of residence. There is a normal inability to leave the home and doing so requires considerable and taxing effort. Other absences are for medical reasons / religious services and are infrequent or of short duration when for other reasons). o If current dressing causes regression in wound condition, may D/C ordered dressing product/s and apply Normal Saline Moist Dressing daily until next Wound Healing Center / Other MD appointment. Notify Wound Healing Center of regression in wound condition at [phone]. o Please direct any NON-WOUND related issues/requests for orders to patient's Primary Care Physician Medications-please add to medication list. Wound #1 Right Calcaneous o Santyl Enzymatic Ointment o Other: - OTC multivitamin with zinc, copper, and selenium Wound #2 Right Toe Third o Santyl Enzymatic Ointment o Other: - OTC multivitamin with zinc, copper, and selenium Wound #3 Left Amputation Site - Below Knee o Santyl Enzymatic Ointment o Other: - OTC multivitamin with zinc, copper, and selenium Patient Medications Allergies: cephalexin, tramadol, amoxicillin,  prednisolone Notifications Medication Indication Start End Santyl 03/11/2015 DOSE topical 250 unit/gram ointment - ointment topical as directed Electronic Signature(s) Signed: 03/11/2015 11:11:15 AM By: Evlyn Kanner MD, FACS Entered By: Evlyn Kanner on 03/11/2015 11:11:14 [name] ([id number]) -------------------------------------------------------------------------------- Problem List Details Patient Name: [name] Date of Service: 03/11/2015 10:15 AM Medical Record Number: [id number] Patient Account Number: [id number] Date of Birth/Sex: Jul 12, 1921 (81 y.o. 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 L97.812 Non-pressure chronic ulcer of other part of right lower leg 02/11/2015 Yes with fat layer exposed T81.31XA Disruption of external operation (surgical) wound, not 02/18/2015 Yes elsewhere classified, initial encounter Inactive Problems Resolved Problems Electronic Signature(s) Signed: 03/11/2015 12:20:02 PM By: Evlyn Kanner MD, FACS Entered By: Evlyn Kanner on 03/11/2015 11:07:58 [name] ([id number]) -------------------------------------------------------------------------------- Progress Note Details Patient Name: [name] Date of  Service: 03/11/2015 10:15 AM Medical Record Number: [id number] Patient Account Number: [id number] Date of Birth/Sex: 1920-12-14 (81 y.o. Female) Treating RN: Primary Care Physician:  Terance Hart, DAVID Other Clinician: Referring Physician: Terance Hart, DAVID Treating Physician/Extender: Rudene Re in Treatment: 4 Subjective Chief Complaint Information obtained from Patient Patient presents to the wound care center for a consult due non healing wound. 81 year old patient with comes with a history of a ulcerated area to the right third toe and the right heel which she's had for about 2 months. History of Present Illness (HPI) The following HPI elements were documented for the patient's wound: Location: right medial heel and right third toe Quality: Patient reports experiencing a dull pain to affected area(s). Severity: Patient states wound are getting worse. Duration: Patient has had the wound for > 3 months prior to seeking treatment at the wound center Timing: Pain in wound is Intermittent (comes and goes Context: The wound appeared gradually over time Modifying Factors: Consults to this date include:vascular surgeon and several recent surgeries. Associated Signs and Symptoms: Patient reports having foul odor and drainage from the left below-knee amputation site. A pleasant 81 year old patient who is known to have diabetes mellitus for several years has recently gone through a series of operations with the vascular surgeon Dr. Gilda Crease. In January and February she had several surgeries on the left lower extremity with an attempt to have limb salvage for gangrenous changes of her forefoot. 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 On 01/07/2015 she also had a right lower extremity distal runoff with a angioplasty of the right anterior tibial artery to maximize her blood flow to the foot. She recently had her staples removed at Dr. Marijean Heath office on 01/31/2015 and at that time a right lower extremity duplex was done which showed patent vessels and a patent stent with distal occluded  posterior tibial artery. Though the duplex was noncritical the recommendations from the PA at the vascular surgery office was that of a angiogram to be done but the patient said she would rather try some bone care before. The patient has received doxycycline and Cipro in the recent past and she takes oral medications for her diabetes. Other than that I reviewed her list of all her medications. No recent hemoglobin A1c has been done and no recent x-rays of the right foot have been done. 02/18/2015 -- x-ray of the right foot was done on 02/11/2015 and it shows no evidence of acute osteomyelitis of the third toe or of the calcaneus. She had gone to the vascular surgery office and they had noted that there is dehiscence of the left part of the amputation site of the below-knee wound and they have asked [place] to kindly take over the care of this. There've been doing dressings for the right heel and right third toe. GIOVANA, FEHLMAN ([id number]) 02/25/2015 -- we have received notes from the vascular group who saw her last on 02/13/2015 and she was seen by the PA Ms. Cleda Daub. She had recommended that the patient continue to follow with [place] for wound care including management of the left BKA stump, which has had dehiscence of the lateral part. They will consider repeating a arterial duplex or angiogram if the right lower extremity does not heal within a reasonable period of time. Objective Constitutional Pulse regular. Respirations normal and unlabored. Afebrile. Vitals Time Taken: 10:28 AM, Height: 62 in, Weight: 155 lbs, BMI: 28.3, Temperature: 97.5  F, Pulse: 58 bpm, Respiratory Rate: 18 breaths/min, Blood Pressure: 100/49 mmHg. Eyes Nonicteric. Reactive to light. Ears, Nose, Mouth, and Throat Lips, teeth, and gums WNL.Marland Kitchen Moist mucosa without lesions . Neck supple and nontender. No palpable supraclavicular or cervical adenopathy. Normal sized without goiter. Respiratory WNL. No  retractions.. Cardiovascular no palpable ankle pulses on the right. No clubbing, cyanosis or edema. Psychiatric Judgement and insight Intact.. No evidence of depression, anxiety, or agitation.. Integumentary (Hair, Skin) Her wounds on the stump of the left below-knee amputation look a little smaller and there is some subcutaneous debris which needs to be sharply debrided.. No crepitus or fluctuance. No peri-wound warmth or erythema. No masses.. Wound #1 status is Open. Original cause of wound was Gradually Appeared. The wound is located on the Right Calcaneous. The wound measures 2cm length x 3cm width x 0.2cm depth; 4.712cm^2 area and 0.942cm^3 volume. The wound is limited to skin breakdown. There is a large amount of purulent drainage noted. The wound margin is flat and intact. There is no granulation within the wound bed. There is a large Tennell, Braxton ([id number]) (67-100%) amount of necrotic tissue within the wound bed including Adherent Slough. The periwound skin appearance exhibited: Erythema. The periwound skin appearance did not exhibit: Callus, Crepitus, Excoriation, Fluctuance, Friable, Induration, Localized Edema, Rash, Scarring, Dry/Scaly, Maceration, Moist, Atrophie Blanche, Cyanosis, Ecchymosis, Hemosiderin Staining, Mottled, Pallor, Rubor. The surrounding wound skin color is noted with erythema which is circumferential. Periwound temperature was noted as No Abnormality. The periwound has tenderness on palpation. Wound #2 status is Open. Original cause of wound was Gradually Appeared. The wound is located on the Right Toe Third. The wound measures 1.3cm length x 1cm width x 0.1cm depth; 1.021cm^2 area and 0.102cm^3 volume. The wound is limited to skin breakdown. There is a small amount of purulent drainage noted. The wound margin is flat and intact. There is no granulation within the wound bed. There is a large (67-100%) amount of necrotic tissue within the wound bed including  Adherent Slough. The periwound skin appearance exhibited: Erythema. The periwound skin appearance did not exhibit: Callus, Crepitus, Excoriation, Fluctuance, Friable, Induration, Localized Edema, Rash, Scarring, Dry/Scaly, Maceration, Moist, Atrophie Blanche, Cyanosis, Ecchymosis, Hemosiderin Staining, Mottled, Pallor, Rubor. The surrounding wound skin color is noted with erythema which is circumferential. Periwound temperature was noted as No Abnormality. The periwound has tenderness on palpation. Wound #3 status is Open. Original cause of wound was Surgical Injury. The wound is located on the Left Amputation Site - Below Knee. The wound measures 1.2cm length x 5.7cm width x 1.5cm depth; 5.372cm^2 area and 8.058cm^3 volume. The wound is limited to skin breakdown. There is a large amount of serous drainage noted. The wound margin is distinct with the outline attached to the wound base. There is no granulation within the wound bed. There is a large (67-100%) amount of necrotic tissue within the wound bed including Adherent Slough. The periwound skin appearance did not exhibit: Callus, Crepitus, Excoriation, Fluctuance, Friable, Induration, Localized Edema, Rash, Scarring, Dry/Scaly, Maceration, Moist, Atrophie Blanche, Cyanosis, Ecchymosis, Hemosiderin Staining, Mottled, Pallor, Rubor, Erythema. Periwound temperature was noted as No Abnormality. The periwound has tenderness on palpation. Wound #4 status is Open. Original cause of wound was Gradually Appeared. The wound is located on the Right,Medial Toe Third. The wound measures 0.4cm length x 0.4cm width x 0.2cm depth; 0.126cm^2 area and 0.025cm^3 volume. The wound is limited to skin breakdown. There is a small amount of serous drainage noted. The wound margin is  distinct with the outline attached to the wound base. There is no granulation within the wound bed. There is a large (67-100%) amount of necrotic tissue within the wound bed including  Adherent Slough. The periwound skin appearance did not exhibit: Callus, Crepitus, Excoriation, Fluctuance, Friable, Induration, Localized Edema, Rash, Scarring, Dry/Scaly, Maceration, Moist, Atrophie Blanche, Cyanosis, Ecchymosis, Hemosiderin Staining, Mottled, Pallor, Rubor, Erythema. Her wounds on the stump of the left below-knee amputation look a little smaller and there is some subcutaneous debris which needs to be sharply debrided. The wounds on her right foot all need sharp debridement. 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 L97.812 - Non-pressure chronic ulcer of other part of right lower leg with fat layer exposed T81.31XA - Disruption of external operation (surgical) wound, not elsewhere classified, initial encounter Diagnoses ICD-10 E11.621: Type 2 diabetes mellitus with foot ulcer I70.235: Atherosclerosis of native arteries of right leg with ulceration of other part of foot Z89.512: Acquired absence of left leg below knee L97.412: Non-pressure chronic ulcer of right heel and midfoot with fat layer exposed L97.812: Non-pressure chronic ulcer of other part of right lower leg with fat layer exposed T81.31XA: Disruption of external operation (surgical) wound, not elsewhere classified, initial encounter We will continue to use Santyl on all the wounds and I have given her a new prescription today. Her niece will call the vascular office to see if any recent workup is required regarding arterial duplex studies. She will come back and see me next week. 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 There was a Skin/Subcutaneous  Tissue Debridement ([id number]) debridement with total area of 6 sq cm performed by Yuri Fana, Ignacia Felling., MD. with the following instrument(s): Curette to remove Viable and Non-Viable tissue/material including Fibrin/Slough, Skin, and Subcutaneous after achieving pain control using Lidocaine 4% Topical Solution. A time out was conducted prior to the start of the procedure. A Minimum amount of bleeding was controlled with Pressure. The procedure was tolerated well with a pain level of 2 throughout and a pain level of 0 following the procedure. Post Debridement Measurements: 2cm length x 3cm width x 0.2cm depth; 0.942cm^3 volume. 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 There was a Skin/Subcutaneous Tissue Debridement ([id number]) debridement with total area of 1.3 sq cm performed by Tristan Schroeder., MD. with the following instrument(s): Curette to remove Non-Viable tissue/material including Fibrin/Slough, Skin, and Subcutaneous after achieving pain control using Lidocaine 4% Topical Solution. A time out was conducted prior to the start of the procedure. A Minimum amount of bleeding was controlled with Pressure. The procedure was tolerated well with a pain level of 2 throughout and a pain level of 0 following the procedure. Post Debridement Measurements: 1.3cm length x 1cm width x 0.1cm depth; 0.102cm^3 volume. Dodgen, Yashira ([id number]) Wound #3 Wound #3 is a Dehisced Wound located on the Left Amputation Site - Below Knee . There was a Skin/Subcutaneous Tissue Debridement ([id number]) debridement with total area of 6.84 sq cm performed by Tristan Schroeder., MD. with the following instrument(s): Curette to remove Non-Viable tissue/material including Fibrin/Slough and Subcutaneous after achieving pain control using Lidocaine 4% Topical  Solution. A time out was conducted prior to the start of the procedure. A Minimum amount of bleeding was controlled  with Pressure. The procedure was tolerated well with a pain level of 2 throughout and a pain level of 0 following the procedure. Post Debridement Measurements: 1.2cm length x 5.7cm width x 1.5cm depth; 8.058cm^3 volume. Wound #4 Wound #4 is a Diabetic Wound/Ulcer of the Lower Extremity located on the Right,Medial Toe Third . There was a Debridement ([id number]) debridement with total area of 0.08 sq cm performed by Tristan Schroeder., MD. with the following instrument(s): Curette to remove Viable and Non-Viable tissue/material including Fibrin/Slough and Subcutaneous after achieving pain control using Lidocaine 4% Topical Solution. A time out was conducted prior to the start of the procedure. There was no bleeding. The procedure was tolerated well with a pain level of 2 throughout and a pain level of 0 following the procedure. Post Debridement Measurements: 0.4cm length x 0.2cm width x 0.2cm depth; 0.013cm^3 volume. Plan Wound Cleansing: Wound #1 Right Calcaneous: Clean wound with Normal Saline. Clean wound with Normal Saline. Wound #2 Right Toe Third: Clean wound with Normal Saline. Clean wound with Normal Saline. Wound #3 Left Amputation Site - Below Knee: Clean wound with Normal Saline. Clean wound with Normal Saline. Wound #4 Right,Medial Toe Third: Clean wound with Normal Saline. Clean wound with Normal Saline. Anesthetic: Wound #1 Right Calcaneous: Topical Lidocaine 4% cream applied to wound bed prior to debridement Wound #2 Right Toe Third: Topical Lidocaine 4% cream applied to wound bed prior to debridement Wound #3 Left Amputation Site - Below Knee: Topical Lidocaine 4% cream applied to wound bed prior to debridement Wound #4 Right,Medial Toe Third: Topical Lidocaine 4% cream applied to wound bed prior to debridement Primary Wound Dressing: Wound #1 Right Calcaneous: Leming, Lalita ([id number]) Santyl Ointment Wound #2 Right Toe Third: Santyl Ointment Wound #3 Left  Amputation Site - Below Knee: Santyl Ointment Wound #4 Right,Medial Toe Third: Santyl Ointment Secondary Dressing: Wound #1 Right Calcaneous: Gauze and Kerlix/Conform Wound #2 Right Toe Third: Gauze and Kerlix/Conform Wound #3 Left Amputation Site - Below Knee: Gauze and Kerlix/Conform Wound #4 Right,Medial Toe Third: Gauze and Kerlix/Conform Dressing Change Frequency: Wound #1 Right Calcaneous: Change dressing every day. Wound #2 Right Toe Third: Change dressing every day. Wound #3 Left Amputation Site - Below Knee: Change dressing every day. Wound #4 Right,Medial Toe Third: Change dressing every day. Follow-up Appointments: Wound #1 Right Calcaneous: Return Appointment in 1 week. Wound #2 Right Toe Third: Return Appointment in 1 week. 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 Wound #4 Right,Medial Toe Third: Return Appointment in 1 week. Off-Loading: Wound #1 Right Calcaneous: Open toe surgical shoe to: - right foot Other: - Offloading boot at night right foot Home Health: Wound #1 Right Calcaneous: Initiate Home Health for Skilled Nursing - Continue [facility] Continue Home Health Visits Home Health Nurse may visit PRN to address patient s wound care needs. FACE TO FACE ENCOUNTER: MEDICARE and MEDICAID PATIENTS: I certify that this patient is under my care and that I had a face-to-face encounter that meets the physician face-to-face encounter requirements with this patient on this date. The encounter with the patient was in whole or in part for the following MEDICAL CONDITION: (primary reason for Home Healthcare) MEDICAL NECESSITY: I certify, that based on my findings, NURSING services are a medically necessary home health service. HOME BOUND STATUS: I certify that my clinical findings support that this  patient is homebound (i.e., Due to illness or injury, pt requires aid of supportive devices such as crutches, cane, wheelchairs, walkers,  the use of special transportation or the assistance of another person to leave their place of residence. There is a [place], Samaiya ([id number]) normal inability to leave the home and doing so requires considerable and taxing effort. Other absences are for medical reasons / religious services and are infrequent or of short duration when for other reasons). If current dressing causes regression in wound condition, may D/C ordered dressing product/s and apply Normal Saline Moist Dressing daily until next Wound Healing Center / Other MD appointment. Notify Wound Healing Center of regression in wound condition at [phone]. Please direct any NON-WOUND related issues/requests for orders to patient's Primary Care Physician Wound #2 Right Toe Third: Initiate Home Health for Skilled Nursing - Continue [facility] Continue Home Health Visits Home Health Nurse may visit PRN to address patient s wound care needs. FACE TO FACE ENCOUNTER: MEDICARE and MEDICAID PATIENTS: I certify that this patient is under my care and that I had a face-to-face encounter that meets the physician face-to-face encounter requirements with this patient on this date. The encounter with the patient was in whole or in part for the following MEDICAL CONDITION: (primary reason for Home Healthcare) MEDICAL NECESSITY: I certify, that based on my findings, NURSING services are a medically necessary home health service. HOME BOUND STATUS: I certify that my clinical findings support that this patient is homebound (i.e., Due to illness or injury, pt requires aid of supportive devices such as crutches, cane, wheelchairs, walkers, the use of special transportation or the assistance of another person to leave their place of residence. There is a normal inability to leave the home and doing so requires considerable and taxing effort. Other absences are for medical reasons / religious services and are infrequent or of short duration when for  other reasons). If current dressing causes regression in wound condition, may D/C ordered dressing product/s and apply Normal Saline Moist Dressing daily until next Wound Healing Center / Other MD appointment. Notify Wound Healing Center of regression in wound condition at [phone]. Please direct any NON-WOUND related issues/requests for orders to patient's Primary Care Physician Wound #3 Left Amputation Site - Below Knee: Initiate Home Health for Skilled Nursing - Continue [facility] Continue Home Health Visits Home Health Nurse may visit PRN to address patient s wound care needs. FACE TO FACE ENCOUNTER: MEDICARE and MEDICAID PATIENTS: I certify that this patient is under my care and that I had a face-to-face encounter that meets the physician face-to-face encounter requirements with this patient on this date. The encounter with the patient was in whole or in part for the following MEDICAL CONDITION: (primary reason for Home Healthcare) MEDICAL NECESSITY: I certify, that based on my findings, NURSING services are a medically necessary home health service. HOME BOUND STATUS: I certify that my clinical findings support that this patient is homebound (i.e., Due to illness or injury, pt requires aid of supportive devices such as crutches, cane, wheelchairs, walkers, the use of special transportation or the assistance of another person to leave their place of residence. There is a normal inability to leave the home and doing so requires considerable and taxing effort. Other absences are for medical reasons / religious services and are infrequent or of short duration when for other reasons). If current dressing causes regression in wound condition, may D/C ordered dressing product/s and apply Normal Saline Moist Dressing daily until next Wound  Healing Center / Other MD appointment. Notify Wound Healing Center of regression in wound condition at [phone]. Please direct any NON-WOUND related  issues/requests for orders to patient's Primary Care Physician Medications-please add to medication list.: Wound #1 Right Calcaneous: Santyl Enzymatic Ointment Other: - OTC multivitamin with zinc, copper, and selenium Wound #2 Right Toe Third: Santyl Enzymatic Ointment Other: - OTC multivitamin with zinc, copper, and selenium Wound #3 Left Amputation Site - Below Knee: Santyl Enzymatic Ointment Moncrieffe, Dannon ([id number]) Other: - OTC multivitamin with zinc, copper, and selenium The following medication(s) was prescribed: Santyl topical 250 unit/gram ointment ointment topical as directed starting 03/11/2015 Follow-Up Appointments: A follow-up appointment should be scheduled. A Patient Clinical Summary of Care was provided to HS We will continue to use Santyl on all the wounds and I have given her a new prescription today. Her niece will call the vascular office to see if any recent workup is required regarding arterial duplex studies. She will come back and see me next week. Electronic Signature(s) Signed: 03/11/2015 5:09:12 PM By: Renee Harder RN Signed: 03/12/2015 4:45:26 PM By: Evlyn Kanner MD, FACS Previous Signature: 03/11/2015 12:20:02 PM Version By: Evlyn Kanner MD, FACS Entered By: Renee Harder on 03/11/2015 17:09:11 [name] ([id number]) -------------------------------------------------------------------------------- SuperBill Details Patient Name: [name] Date of Service: 03/11/2015 Medical Record Number: [id number] Patient Account Number: [id number] Date of Birth/Sex: [birth year] (81 y.o. 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 L97.812 Non-pressure chronic ulcer of other part of right lower leg with fat layer exposed Disruption of external operation (surgical) wound, not elsewhere classified, initial T81.31XA encounter Facility Procedures CPT4: Description Modifier Quantity Code 44010272 11042 - DEB SUBQ TISSUE 20 SQ CM/< 1 ICD-10 Description Diagnosis E11.621 Type 2 diabetes mellitus with foot ulcer I70.235 Atherosclerosis of native arteries of right leg with ulceration of other part of  foot 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 L97.812 Non-pressure chronic ulcer of other part of right lower leg with fat layer exposed Physician Procedures CPT4: Description Modifier Quantity Code 5366440 11042 - WC PHYS SUBQ TISS 20 SQ CM 1 ICD-10 Description Diagnosis E11.621 Type 2 diabetes mellitus with foot ulcer I70.235 Atherosclerosis of native arteries of right leg with ulceration of other part of  foot 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

## 2015-03-12 NOTE — Progress Notes (Signed)
BRITTANIE, SALEY ([id number]) Visit Report for 03/11/2015 Arrival Information Details Patient Name: [name], [name] Date of Service: 03/11/2015 10:15 AM Medical Record Number: [id number] Patient Account Number: [id number] Date of Birth/Sex: [birth year] (81 y.o. Female) Treating RN: Afful, RN, BSN, [place] Sink Primary Care Physician: Dorothey Baseman Other Clinician: Referring Physician: Terance Hart, DAVID Treating Physician/Extender: Rudene Re in Treatment: 4 Visit Information History Since Last Visit Any new allergies or adverse reactions: No Patient Arrived: Wheel Chair Had a fall or experienced change in No activities of daily living that may affect Arrival Time: 10:23 risk of falls: Accompanied By: grdn dtr Signs or symptoms of abuse/neglect since last No Transfer Assistance: None visito Patient Identification Verified: Yes Hospitalized since last visit: No Secondary Verification Process Yes Has Dressing in Place as Prescribed: Yes Completed: Pain Present Now: No Patient Has Alerts: Yes Patient Alerts: DMII Electronic Signature(s) Signed: 03/11/2015 12:22:51 PM By: Elpidio Eric BSN, RN Entered By: Elpidio Eric on 03/11/2015 10:24:01 [name] ([id number]) -------------------------------------------------------------------------------- Encounter Discharge Information Details Patient Name: [name] Date of Service: 03/11/2015 10:15 AM Medical Record Number: [id number] Patient Account Number: [id number] Date of Birth/Sex: [birth year] (81 y.o. Female) Treating RN: Primary Care Physician: Terance Hart, DAVID Other Clinician: Referring Physician: Terance Hart, DAVID Treating Physician/Extender: Rudene Re in Treatment: 4 Encounter Discharge Information Items Discharge Pain Level: 0 Discharge Condition: Stable Ambulatory Status: Wheelchair Discharge Destination: Home Transportation: Private Auto Accompanied By: granddaughter Schedule Follow-up Appointment:  Yes Medication Reconciliation completed and provided to No Patient/Care Phinehas Grounds: Provided on Clinical Summary of Care: 03/11/2015 Form Type Recipient Paper Patient HS Electronic Signature(s) Signed: 03/11/2015 12:22:28 PM By: Curtis Sites Previous Signature: 03/11/2015 11:07:06 AM Version By: Gwenlyn Perking Previous Signature: 03/11/2015 11:06:28 AM Version By: Gwenlyn Perking Entered By: Curtis Sites on 03/11/2015 12:22:28 [name] ([id number]) -------------------------------------------------------------------------------- Lower Extremity Assessment Details Patient Name: [name] Date of Service: 03/11/2015 10:15 AM Medical Record Number: [id number] Patient Account Number: [id number] Date of Birth/Sex: [birth year] (81 y.o. Female) Treating RN: Afful, RN, BSN, [place] Sink Primary Care Physician: Dorothey Baseman Other Clinician: Referring Physician: Terance Hart, DAVID Treating Physician/Extender: Rudene Re in Treatment: 4 Vascular Assessment Pulses: Posterior Tibial Palpable: [Right:No] Doppler: [Right:Monophasic] Dorsalis Pedis Palpable: [Right:No] Doppler: [Right:Inaudible] Extremity colors, hair growth, and conditions: Extremity Color: [Right:Mottled] Hair Growth on Extremity: [Right:No] Temperature of Extremity: [Right:Cool] Capillary Refill: [Right:< 3 seconds] Dependent Rubor: [Right:No] Blanched when Elevated: [Right:No] Lipodermatosclerosis: [Right:No] Toe Nail Assessment Left: Right: Thick: Yes Discolored: Yes Deformed: Yes Improper Length and Hygiene: Yes Electronic Signature(s) Signed: 03/11/2015 12:22:51 PM By: Elpidio Eric BSN, RN Entered By: Elpidio Eric on 03/11/2015 10:40:07 [name] ([id number]) -------------------------------------------------------------------------------- Multi Wound Chart Details Patient Name: [name] Date of Service: 03/11/2015 10:15 AM Medical Record Number: [id number] Patient Account Number:  [id number] Date of Birth/Sex: Jun 04, 1921 (81 y.o. 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] Comorbid History: [1:Arrhythmia, Deep Vein Thrombosis, Peripheral Arterial Disease, Type II Arterial Disease, Type II Arterial Disease, Type II Diabetes] [2:Arrhythmia, Deep Vein Thrombosis, Peripheral Diabetes] [3:Arrhythmia, Deep Vein Thrombosis,  Peripheral Diabetes] Date Acquired: [1:11/04/2014] [2:11/04/2014] [3:01/03/2015] Weeks of Treatment: [1:4] [  2:4] [3:3] Wound Status: [1:Open] [2:Open] [3:Open] Measurements L x W x D 2x3x0.2 [2:1.3x1x0.1] [3:1.2x5.7x1.5] (cm) Area (cm) : [1:4.712] [2:1.021] [3:5.372] Volume (cm) : [1:0.942] [2:0.102] [3:8.058] % Reduction in Area: [1:14.30%] [2:-8.40%] [3:48.20%] % Reduction in Volume: -71.30% [2:-8.50%] [3:-94.30%] Classification: [1:Full Thickness Without Exposed Support Structures] [2:Full Thickness Without Exposed Support Structures] [3:Full Thickness Without Exposed Support Structures] HBO Classification: [1:Grade 1] [2:Grade 1] [3:Grade 2] Exudate Amount: [1:Large] [2:Small] [3:Large] Exudate Type: [1:Purulent] [2:Purulent] [3:Serous] Exudate Color: [1:yellow, brown, green] [2:yellow, brown, green] [3:amber] Wound Margin: [1:Flat and Intact] [2:Flat and Intact] [3:Distinct, outline attached] Granulation  Amount: [1:None Present (0%)] [2:None Present (0%)] [3:None Present (0%)] Necrotic Amount: [1:Large (67-100%)] [2:Large (67-100%)] [3:Large (67-100%)] Exposed Structures: [1:Fascia: No Fat: No] [2:Fascia: No Fat: No] [3:Fascia: No Fat: No] Tendon: No Tendon: No Tendon: No Muscle: No Muscle: No Muscle: No Joint: No Joint: No Joint: No Bone: No Bone: No Bone: No Limited to Skin Limited to Skin Limited to Skin Breakdown Breakdown Breakdown Epithelialization: None None None Periwound Skin Texture: Edema: No Edema: No Edema: No Excoriation: No Excoriation: No Excoriation: No Induration: No Induration: No Induration: No Callus: No Callus: No Callus: No Crepitus: No Crepitus: No Crepitus: No Fluctuance: No Fluctuance: No Fluctuance: No Friable: No Friable: No Friable: No Rash: No Rash: No Rash: No Scarring: No Scarring: No Scarring: No Periwound Skin Maceration: No Maceration: No Maceration: No Moisture: Moist: No Moist: No Moist: No Dry/Scaly: No Dry/Scaly: No Dry/Scaly: No Periwound Skin Color: Erythema: Yes Erythema: Yes Atrophie Blanche: No Atrophie Blanche: No Atrophie Blanche: No Cyanosis: No Cyanosis: No Cyanosis: No Ecchymosis: No Ecchymosis: No Ecchymosis: No Erythema: No Hemosiderin Staining: No Hemosiderin Staining: No Hemosiderin Staining: No Mottled: No Mottled: No Mottled: No Pallor: No Pallor: No Pallor: No Rubor: No Rubor: No Rubor: No Erythema Location: Circumferential Circumferential N/A Temperature: No Abnormality No Abnormality No Abnormality Tenderness on Yes Yes Yes Palpation: Wound Preparation: Ulcer Cleansing: Ulcer Cleansing: Ulcer Cleansing: Rinsed/Irrigated with Rinsed/Irrigated with Rinsed/Irrigated with Saline Saline Saline Topical Anesthetic Topical Anesthetic Topical Anesthetic Applied: Other: lidocaine Applied: Other: lidocaine Applied: Other: lidocaine 4% 4% 4% Wound Number: 4 N/A N/A Photos: No Photos  N/A N/A Wound Location: Toe Third N/A N/A Wounding Event: Gradually Appeared N/A N/A Primary Etiology: Diabetic Wound/Ulcer of N/A N/A the Lower Extremity Comorbid History: Arrhythmia, Deep Vein N/A N/A Thrombosis, Peripheral Arterial Disease, Type II Diabetes Date Acquired: 02/25/2015 N/A N/A LEEVER, Tempestt ([id number]) Weeks of Treatment: 0 N/A N/A Wound Status: Open N/A N/A Measurements L x W x D 0.4x0.4x0.2 N/A N/A (cm) Area (cm) : 0.126 N/A N/A Volume (cm) : 0.025 N/A N/A % Reduction in Area: 0.00% N/A N/A % Reduction in Volume: 0.00% N/A N/A Classification: Grade 2 N/A N/A HBO Classification: N/A N/A N/A Exudate Amount: Small N/A N/A Exudate Type: Serous N/A N/A Exudate Color: amber N/A N/A Wound Margin: Distinct, outline attached N/A N/A Granulation Amount: None Present (0%) N/A N/A Necrotic Amount: Large (67-100%) N/A N/A Exposed Structures: Fascia: No N/A N/A Fat: No Tendon: No Muscle: No Joint: No Bone: No Limited to Skin Breakdown Epithelialization: None N/A N/A Periwound Skin Texture: Edema: No N/A N/A Excoriation: No Induration: No Callus: No Crepitus: No Fluctuance: No Friable: No Rash: No Scarring: No Periwound Skin Maceration: No N/A N/A Moisture: Moist: No Dry/Scaly: No Periwound Skin Color: Atrophie Blanche: No N/A N/A Cyanosis: No Ecchymosis: No Erythema: No Hemosiderin Staining: No Mottled: No Pallor: No Rubor: No Erythema Location: N/A N/A N/A Temperature: N/A N/A N/A Tenderness  on No N/A N/A Palpation: Wound Preparation: N/A N/A Chaylee, Ehrsam Saraiya ([id number]) Ulcer Cleansing: Rinsed/Irrigated with Saline Topical Anesthetic Applied: Other: liodocaine 4% Treatment Notes Electronic Signature(s) Signed: 03/11/2015 5:14:43 PM By: Renee Harder RN Entered By: Renee Harder on 03/11/2015 10:51:48 [name] ([id number]) -------------------------------------------------------------------------------- Multi-Disciplinary Care  Plan Details Patient Name: [name] Date of Service: 03/11/2015 10:15 AM Medical Record Number: [id number] Patient Account Number: [id number] Date of Birth/Sex: [birth year] (81 y.o. Female) Treating RN: Renee Harder Primary Care Physician: Dorothey Baseman Other Clinician: Referring Physician: Dorothey Baseman Treating Physician/Extender: Rudene Re in Treatment: 4 Active Inactive Abuse / Safety / Falls / Self Care Management Nursing Diagnoses: Impaired physical mobility Potential for falls Goals: Patient will remain injury free Date Initiated: 02/11/2015 Goal Status: Active Patient/caregiver will verbalize understanding of skin care regimen Date Initiated: 02/11/2015 Goal Status: Active Patient/caregiver will verbalize/demonstrate measures taken to prevent injury and/or falls Date Initiated: 02/11/2015 Goal Status: Active Patient/caregiver will verbalize/demonstrate understanding of what to do in case of emergency Date Initiated: 02/11/2015 Goal Status: Active Interventions: Assess fall risk on admission and as needed Provide education on fall prevention Provide education on safe transfers Treatment Activities: Patient referred to home care : 03/11/2015 Notes: Nutrition Nursing Diagnoses: Imbalanced nutrition Potential for alteratiion in Nutrition/Potential for imbalanced nutrition Palm, Odette ([id number]) Goals: Patient/caregiver verbalizes understanding of need to maintain therapeutic glucose control per primary care physician Date Initiated: 02/11/2015 Goal Status: Active Patient/caregiver will maintain therapeutic glucose control Date Initiated: 02/11/2015 Goal Status: Active Interventions: Assess HgA1c results as ordered upon admission and as needed Provide education on elevated blood sugars and impact on wound healing Provide education on nutrition Treatment Activities: Obtain HgA1c : 03/11/2015 Notes: Orientation to the Wound Care  Program Nursing Diagnoses: Knowledge deficit related to the wound healing center program Goals: Patient/caregiver will verbalize understanding of the Wound Healing Center Program Date Initiated: 02/11/2015 Goal Status: Active Interventions: Provide education on orientation to the wound center Notes: Wound/Skin Impairment Nursing Diagnoses: Impaired tissue integrity Knowledge deficit related to ulceration/compromised skin integrity Goals: Patient/caregiver will verbalize understanding of skin care regimen Date Initiated: 02/11/2015 Goal Status: Active Ulcer/skin breakdown will heal within 14 weeks Date Initiated: 02/11/2015 BRONDA, ALFRED ([id number]) Goal Status: Active Interventions: Assess patient/caregiver ability to obtain necessary supplies Assess patient/caregiver ability to perform ulcer/skin care regimen upon admission and as needed Assess ulceration(s) every visit Provide education on ulcer and skin care Treatment Activities: Skin care regimen initiated : 03/11/2015 Topical wound management initiated : 03/11/2015 Notes: Electronic Signature(s) Signed: 03/11/2015 5:14:43 PM By: Renee Harder RN Entered By: Renee Harder on 03/11/2015 10:51:35 [name] ([id number]) -------------------------------------------------------------------------------- Pain Assessment Details Patient Name: [name] Date of Service: 03/11/2015 10:15 AM Medical Record Number: [id number] Patient Account Number: [id number] Date of Birth/Sex: 01-12-1921 (81 y.o. Female) Treating RN: Clover Mealy, RN, BSN, [place] Sink Primary Care Physician: Dorothey Baseman Other Clinician: Referring Physician: Dorothey Baseman Treating Physician/Extender: Rudene Re in Treatment: 4 Active Problems Location of Pain Severity and Description of Pain Patient Has Paino No Site Locations Pain Management and Medication Current Pain Management: Electronic Signature(s) Signed: 03/11/2015 12:22:51 PM By: Elpidio Eric BSN, RN Entered By: Elpidio Eric on 03/11/2015 10:24:07 [name] ([id number]) -------------------------------------------------------------------------------- Patient/Caregiver Education Details Patient Name: [name] Date of Service: 03/11/2015 10:15 AM Medical Record Number: [id number] Patient Account Number: [id number] Date of Birth/Gender: Jan 06, 1921 (81 y.o. Female) Treating RN: Curtis Sites Primary Care Physician: Dorothey Baseman Other Clinician: Referring Physician: Dorothey Baseman Treating Physician/Extender: Rudene Re in Treatment: 4 Education Assessment Education  Provided To: Patient and Caregiver Education Topics Provided Wound/Skin Impairment: Handouts: Other: wound care to continue as ordered Methods: Demonstration, Explain/Verbal Responses: State content correctly Electronic Signature(s) Signed: 03/11/2015 12:22:53 PM By: Curtis Sites Entered By: Curtis Sites on 03/11/2015 12:22:53 [name] ([id number]) -------------------------------------------------------------------------------- Wound Assessment Details Patient Name: [name] Date of Service: 03/11/2015 10:15 AM Medical Record Number: [id number] Patient Account Number: [id number] Date of Birth/Sex: [birth year] (81 y.o. Female) Treating RN: Afful, RN, BSN, [place] Sink Primary Care Physician: Dorothey Baseman Other Clinician: Referring Physician: Terance Hart, DAVID Treating Physician/Extender: Rudene Re in Treatment: 4 Wound Status Wound Number: 1 Primary Arterial Insufficiency Ulcer Etiology: Wound Location: Right Calcaneous Wound Open Wounding Event: Gradually Appeared Status: Date Acquired: 11/04/2014 Comorbid Arrhythmia, Deep Vein Thrombosis, Weeks Of Treatment: 4 History: Peripheral Arterial Disease, Type II Clustered Wound: No Diabetes Photos Photo Uploaded By: Curtis Sites on 03/11/2015 12:16:42 Wound Measurements Length: (cm) 2 % Reduction i Width:  (cm) 3 % Reduction i Depth: (cm) 0.2 Epithelializa Area: (cm) 4.712 Volume: (cm) 0.942 n Area: 14.3% n Volume: -71.3% tion: None Wound Description Full Thickness Without Foul Odor Aft Classification: Exposed Support Structures Diabetic Severity Grade 1 (Wagner): Wound Margin: Flat and Intact Exudate Amount: Large Exudate Type: Purulent Exudate Color: yellow, brown, green er Cleansing: No Wound Bed Granulation Amount: None Present (0%) Exposed Structure [place], Salome ([id number]) Necrotic Amount: Large (67-100%) Fascia Exposed: No Necrotic Quality: Adherent Slough Fat Layer Exposed: No Tendon Exposed: No Muscle Exposed: No Joint Exposed: No Bone Exposed: No Limited to Skin Breakdown Periwound Skin Texture Texture Color No Abnormalities Noted: No No Abnormalities Noted: No Callus: No Atrophie Blanche: No Crepitus: No Cyanosis: No Excoriation: No Ecchymosis: No Fluctuance: No Erythema: Yes Friable: No Erythema Location: Circumferential Induration: No Hemosiderin Staining: No Localized Edema: No Mottled: No Rash: No Pallor: No Scarring: No Rubor: No Moisture Temperature / Pain No Abnormalities Noted: No Temperature: No Abnormality Dry / Scaly: No Tenderness on Palpation: Yes Maceration: No Moist: No Wound Preparation Ulcer Cleansing: Rinsed/Irrigated with Saline Topical Anesthetic Applied: Other: lidocaine 4%, Treatment Notes Wound #1 (Right Calcaneous) 1. Cleansed with: Clean wound with Normal Saline 2. Anesthetic Topical Lidocaine 4% cream to wound bed prior to debridement 4. Dressing Applied: Santyl Ointment 5. Secondary Dressing Applied Kerlix/Conform Non-Adherent pad 7. Secured with Secretary/administrator) Signed: 03/11/2015 12:22:51 PM By: Elpidio Eric BSN, RN [place], Madalynn ([id number]) Entered By: Elpidio Eric on 03/11/2015 10:35:02 [name]  ([id number]) -------------------------------------------------------------------------------- Wound Assessment Details Patient Name: [name] Date of Service: 03/11/2015 10:15 AM Medical Record Number: [id number] Patient Account Number: [id number] Date of Birth/Sex: 05-27-1921 (81 y.o. Female) Treating RN: Afful, RN, BSN, [place] Sink Primary Care Physician: Terance Hart, DAVID Other Clinician: Referring Physician: Terance Hart, DAVID Treating Physician/Extender: Rudene Re in Treatment: 4 Wound Status Wound Number: 2 Primary Arterial Insufficiency Ulcer Etiology: Wound Location: Right Toe Third Wound Open Wounding Event: Gradually Appeared Status: Date Acquired: 11/04/2014 Comorbid Arrhythmia, Deep Vein Thrombosis, Weeks Of Treatment: 4 History: Peripheral Arterial Disease, Type II Clustered Wound: No Diabetes Photos Photo Uploaded By: Curtis Sites on 03/11/2015 12:16:42 Wound Measurements Length: (cm) 1.3 Width: (cm) 1 Depth: (cm) 0.1 Area: (cm) 1.021 Volume: (cm) 0.102 % Reduction in Area: -8.4% % Reduction in Volume: -8.5% Epithelialization: None Wound Description Full Thickness Without Foul Odor Aft Classification: Exposed Support Structures Diabetic Severity Grade 1 (Wagner): Wound Margin: Flat and Intact Exudate Amount: Small Exudate Type: Purulent Exudate Color: yellow, brown, green er Cleansing: No Wound Bed Granulation Amount: None Present (0%) Exposed Structure Figley,  Saxon ([id number]) Necrotic Amount: Large (67-100%) Fascia Exposed: No Necrotic Quality: Adherent Slough Fat Layer Exposed: No Tendon Exposed: No Muscle Exposed: No Joint Exposed: No Bone Exposed: No Limited to Skin Breakdown Periwound Skin Texture Texture Color No Abnormalities Noted: No No Abnormalities Noted: No Callus: No Atrophie Blanche: No Crepitus: No Cyanosis: No Excoriation: No Ecchymosis: No Fluctuance: No Erythema: Yes Friable: No Erythema Location:  Circumferential Induration: No Hemosiderin Staining: No Localized Edema: No Mottled: No Rash: No Pallor: No Scarring: No Rubor: No Moisture Temperature / Pain No Abnormalities Noted: No Temperature: No Abnormality Dry / Scaly: No Tenderness on Palpation: Yes Maceration: No Moist: No Wound Preparation Ulcer Cleansing: Rinsed/Irrigated with Saline Topical Anesthetic Applied: Other: lidocaine 4%, Treatment Notes Wound #2 (Right Toe Third) 1. Cleansed with: Clean wound with Normal Saline 2. Anesthetic Topical Lidocaine 4% cream to wound bed prior to debridement 4. Dressing Applied: Santyl Ointment 5. Secondary Dressing Applied Kerlix/Conform Non-Adherent pad 7. Secured with Secretary/administrator) Signed: 03/11/2015 12:22:51 PM By: Elpidio Eric BSN, RN [place], Shanitra ([id number]) Entered By: Elpidio Eric on 03/11/2015 10:35:18 [name] ([id number]) -------------------------------------------------------------------------------- Wound Assessment Details Patient Name: [name] Date of Service: 03/11/2015 10:15 AM Medical Record Number: [id number] Patient Account Number: [id number] Date of Birth/Sex: Jun 11, 1921 (81 y.o. Female) Treating RN: Afful, RN, BSN, [place] Sink Primary Care Physician: Terance Hart, DAVID Other Clinician: Referring Physician: Terance Hart, DAVID Treating Physician/Extender: Rudene Re in Treatment: 4 Wound Status Wound Number: 3 Primary Dehisced Wound Etiology: Wound Location: Left Amputation Site - Below Knee Wound Open Status: Wounding Event: Surgical Injury Comorbid Arrhythmia, Deep Vein Thrombosis, Date Acquired: 01/03/2015 History: Peripheral Arterial Disease, Type II Weeks Of Treatment: 3 Diabetes Clustered Wound: No Photos Photo Uploaded By: Curtis Sites on 03/11/2015 12:17:16 Wound Measurements Length: (cm) 1.2 Width: (cm) 5.7 Depth: (cm) 1.5 Area: (cm) 5.372 Volume: (cm) 8.058 % Reduction in Area: 48.2% %  Reduction in Volume: -94.3% Epithelialization: None Wound Description Full Thickness Without Classification: Exposed Support Structures Diabetic Severity Grade 2 (Wagner): Wound Margin: Distinct, outline attached Exudate Amount: Large Exudate Type: Serous Exudate Color: amber Foul Odor After Cleansing: No Wound Bed Granulation Amount: None Present (0%) Exposed Structure Hymes, Faithlynn ([id number]) Necrotic Amount: Large (67-100%) Fascia Exposed: No Necrotic Quality: Adherent Slough Fat Layer Exposed: No Tendon Exposed: No Muscle Exposed: No Joint Exposed: No Bone Exposed: No Limited to Skin Breakdown Periwound Skin Texture Texture Color No Abnormalities Noted: No No Abnormalities Noted: No Callus: No Atrophie Blanche: No Crepitus: No Cyanosis: No Excoriation: No Ecchymosis: No Fluctuance: No Erythema: No Friable: No Hemosiderin Staining: No Induration: No Mottled: No Localized Edema: No Pallor: No Rash: No Rubor: No Scarring: No Temperature / Pain Moisture Temperature: No Abnormality No Abnormalities Noted: No Tenderness on Palpation: Yes Dry / Scaly: No Maceration: No Moist: No Wound Preparation Ulcer Cleansing: Rinsed/Irrigated with Saline Topical Anesthetic Applied: Other: lidocaine 4%, Treatment Notes Wound #3 (Left Amputation Site - Below Knee) 1. Cleansed with: Clean wound with Normal Saline 2. Anesthetic Topical Lidocaine 4% cream to wound bed prior to debridement 4. Dressing Applied: Santyl Ointment Plain packing gauze 5. Secondary Dressing Applied Non-Adherent pad 7. Secured with Secretary/administrator) Signed: 03/11/2015 12:22:51 PM By: Elpidio Eric BSN, RN [place], [place] ([id number]) Entered By: Elpidio Eric on 03/11/2015 10:35:32 [name] ([id number]) -------------------------------------------------------------------------------- Wound Assessment Details Patient Name: [name] Date of Service: 03/11/2015 10:15  AM Medical Record Number: [id number] Patient Account Number: [id number] Date of Birth/Sex: 1920-11-11 (81 y.o. Female) Treating RN: Afful, RN, BSN, [employer]  Care Physician: Terance Hart, DAVID Other Clinician: Referring Physician: Dorothey Baseman Treating Physician/Extender: Rudene Re in Treatment: 4 Wound Status Wound Number: 4 Primary Diabetic Wound/Ulcer of the Lower Etiology: Extremity Wound Location: Toe Third Wound Open Wounding Event: Gradually Appeared Status: Date Acquired: 02/25/2015 Comorbid Arrhythmia, Deep Vein Thrombosis, Weeks Of Treatment: 0 History: Peripheral Arterial Disease, Type II Clustered Wound: No Diabetes Photos Photo Uploaded By: Curtis Sites on 03/11/2015 12:17:17 Wound Measurements Length: (cm) 0.4 Width: (cm) 0.4 Depth: (cm) 0.2 Area: (cm) 0.126 Volume: (cm) 0.025 % Reduction in Area: 0% % Reduction in Volume: 0% Epithelialization: None Wound Description Classification: Grade 2 Wound Margin: Distinct, outline attached Exudate Amount: Small Exudate Type: Serous Exudate Color: amber Wound Bed Granulation Amount: None Present (0%) Exposed Structure Necrotic Amount: Large (67-100%) Fascia Exposed: No Necrotic Quality: Adherent Slough Fat Layer Exposed: No Tendon Exposed: No Borelli, Devan ([id number]) Muscle Exposed: No Joint Exposed: No Bone Exposed: No Limited to Skin Breakdown Periwound Skin Texture Texture Color No Abnormalities Noted: No No Abnormalities Noted: No Callus: No Atrophie Blanche: No Crepitus: No Cyanosis: No Excoriation: No Ecchymosis: No Fluctuance: No Erythema: No Friable: No Hemosiderin Staining: No Induration: No Mottled: No Localized Edema: No Pallor: No Rash: No Rubor: No Scarring: No Moisture No Abnormalities Noted: No Dry / Scaly: No Maceration: No Moist: No Wound Preparation Ulcer Cleansing: Rinsed/Irrigated with Saline Topical Anesthetic Applied: Other: liodocaine  4%, Electronic Signature(s) Signed: 03/11/2015 12:22:51 PM By: Elpidio Eric BSN, RN Entered By: Elpidio Eric on 03/11/2015 10:37:40 [name] ([id number]) -------------------------------------------------------------------------------- Vitals Details Patient Name: [name] Date of Service: 03/11/2015 10:15 AM Medical Record Number: [id number] Patient Account Number: [id number] Date of Birth/Sex: 12-Jan-1921 (81 y.o. Female) Treating RN: Afful, RN, BSN, [place] Primary Care Physician: Terance Hart, DAVID Other Clinician: Referring Physician: Terance Hart, DAVID Treating Physician/Extender: Rudene Re in Treatment: 4 Vital Signs Time Taken: 10:28 Temperature (F): 97.5 Height (in): 62 Pulse (bpm): 58 Weight (lbs): 155 Respiratory Rate (breaths/min): 18 Body Mass Index (BMI): 28.3 Blood Pressure (mmHg): 100/49 Reference Range: 80 - 120 mg / dl Electronic Signature(s) Signed: 03/11/2015 12:22:51 PM By: Elpidio Eric BSN, RN Entered By: Elpidio Eric on 03/11/2015 08:65:78

## 2015-03-18 ENCOUNTER — Encounter: Payer: Medicare Other | Admitting: Surgery

## 2015-03-18 DIAGNOSIS — L97812 Non-pressure chronic ulcer of other part of right lower leg with fat layer exposed: Secondary | ICD-10-CM | POA: Diagnosis not present

## 2015-03-19 NOTE — Progress Notes (Signed)
AITHANA, KUSHNER ([id number]) Visit Report for 03/18/2015 Arrival Information Details Patient Name: [name], [name] Date of Service: 03/18/2015 11:00 AM Medical Record Number: [id number] Patient Account Number: [id number] Date of Birth/Sex: [birth year] (81 y.o. Female) Treating RN: Curtis Sites Primary Care Physician: Dorothey Baseman Other Clinician: Referring Physician: Dorothey Baseman Treating Physician/Extender: Rudene Re in Treatment: 5 Visit Information History Since Last Visit Added or deleted any medications: No Patient Arrived: Wheel Chair Any new allergies or adverse reactions: No Arrival Time: 11:10 Had a fall or experienced change in No activities of daily living that may affect Accompanied By: family risk of falls: Transfer Assistance: Manual Signs or symptoms of abuse/neglect since last No Patient Identification Verified: Yes visito Secondary Verification Process Yes Hospitalized since last visit: No Completed: Pain Present Now: No Patient Has Alerts: Yes Patient Alerts: DMII Electronic Signature(s) Signed: 03/18/2015 5:20:40 PM By: Curtis Sites Entered By: Curtis Sites on 03/18/2015 11:16:03 [name] ([id number]) -------------------------------------------------------------------------------- Encounter Discharge Information Details Patient Name: [name] Date of Service: 03/18/2015 11:00 AM Medical Record Number: [id number] Patient Account Number: [id number] Date of Birth/Sex: 1921-04-19 (81 y.o. Female) Treating RN: Primary Care Physician: Terance Hart, DAVID Other Clinician: Referring Physician: Terance Hart, DAVID Treating Physician/Extender: Rudene Re in Treatment: 5 Encounter Discharge Information Items Discharge Pain Level: 0 Discharge Condition: Stable Ambulatory Status: Wheelchair Discharge Destination: Home Transportation: Private Auto Accompanied By: family Schedule Follow-up Appointment: Yes Medication  Reconciliation completed and provided to Patient/Care No Alnisa Hasley: Provided on Clinical Summary of Care: 03/18/2015 Form Type Recipient Paper Patient HS Electronic Signature(s) Signed: 03/18/2015 12:27:14 PM By: Curtis Sites Previous Signature: 03/18/2015 12:21:58 PM Version By: Gwenlyn Perking Entered By: Curtis Sites on 03/18/2015 12:27:14 [name] ([id number]) -------------------------------------------------------------------------------- Lower Extremity Assessment Details Patient Name: [name] Date of Service: 03/18/2015 11:00 AM Medical Record Number: [id number] Patient Account Number: [id number] Date of Birth/Sex: 1920-10-28 (81 y.o. Female) Treating RN: Curtis Sites Primary Care Physician: Dorothey Baseman Other Clinician: Referring Physician: Terance Hart, DAVID Treating Physician/Extender: Rudene Re in Treatment: 5 Vascular Assessment Pulses: Posterior Tibial Palpable: [Right:No] Doppler: [Right:Monophasic] Dorsalis Pedis Palpable: [Right:No] Doppler: [Right:Inaudible] Extremity colors, hair growth, and conditions: Extremity Color: [Right:Mottled] Hair Growth on Extremity: [Right:No] Temperature of Extremity: [Right:Cool] Capillary Refill: [Right:< 3 seconds] Electronic Signature(s) Signed: 03/18/2015 5:20:40 PM By: Curtis Sites Entered By: Curtis Sites on 03/18/2015 11:25:37 [name] ([id number]) -------------------------------------------------------------------------------- Multi Wound Chart Details Patient Name: [name] Date of Service: 03/18/2015 11:00 AM Medical Record Number: [id number] Patient Account Number: [id number] Date of Birth/Sex: 1920-11-12 (81 y.o. 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] Date Acquired: [1:11/04/2014] [2:11/04/2014] [3:01/03/2015] Weeks of Treatment: [1:5] [2:5] [3:4] Wound Status: [1:Open] [2:Open] [3:Open] Measurements L x W x D 1.9x2.3x0.2 [2:1.1x1x0.1] [3:1.4x6.2x1] (cm) Area (cm) : [1:3.432] [2:0.864] [3:6.817] Volume (cm) : [1:0.686] [2:0.086] [3:6.817] % Reduction in Area: [1:37.60%] [2:8.30%] [3:34.20%] % Reduction in Volume: -24.70% [2:8.50%] [3:-64.40%] Classification: [1:Full Thickness Without Exposed Support Structures] [2:Full Thickness Without Exposed Support Structures] [3:Full Thickness Without Exposed Support Structures] Periwound Skin Texture: No Abnormalities Noted No Abnormalities Noted No Abnormalities Noted Periwound Skin [1:No  Abnormalities Noted No Abnormalities Noted No Abnormalities Noted] Moisture: Periwound Skin Color: No Abnormalities Noted No Abnormalities Noted No Abnormalities Noted Tenderness on [1:No] [2:No] [3:No] Wound Number: 4 N/A N/A Photos: No Photos N/A N/A Wound Location: Right, Medial Toe Third N/A N/A Wounding Event: Gradually Appeared N/A N/A Primary Etiology: Diabetic Wound/Ulcer of N/A N/A the Lower Extremity [name], [name] ([id number]) Date Acquired: 02/25/2015 N/A N/A Weeks of Treatment: 1 N/A N/A Wound Status: Open N/A N/A Measurements L x W x D 0.3x0.4x0.1 N/A N/A (cm) Area (cm) : 0.094 N/A N/A Volume (cm) : 0.009 N/A N/A % Reduction in Area: 25.40% N/A N/A % Reduction in Volume: 64.00% N/A N/A Classification: Grade 2 N/A N/A Periwound Skin Texture: No  Abnormalities Noted N/A N/A Periwound Skin No Abnormalities Noted N/A N/A Moisture: Periwound Skin Color: No Abnormalities Noted N/A N/A Tenderness on No N/A N/A Palpation: Treatment Notes Electronic Signature(s) Signed: 03/18/2015 5:19:44 PM By: Renee Harder RN Entered By: Renee Harder on 03/18/2015 11:33:11 [name] ([id number]) -------------------------------------------------------------------------------- Multi-Disciplinary Care Plan Details Patient Name: [name] Date of Service: 03/18/2015 11:00 AM Medical Record Number: [id number] Patient Account Number: [id number] Date of Birth/Sex: [birth year] (81 y.o. Female) Treating RN: Renee Harder Primary Care Physician: Dorothey Baseman Other Clinician: Referring Physician: Dorothey Baseman Treating Physician/Extender: Rudene Re in Treatment: 5 Active Inactive Abuse / Safety / Falls / Self Care Management Nursing Diagnoses: Impaired physical mobility Potential for falls Goals: Patient will remain injury free Date Initiated: 02/11/2015 Goal Status: Active Patient/caregiver will verbalize understanding of skin care regimen Date Initiated: 02/11/2015 Goal Status: Active Patient/caregiver will verbalize/demonstrate measures taken to prevent injury and/or falls Date Initiated: 02/11/2015 Goal Status: Active Patient/caregiver will verbalize/demonstrate understanding of what to do in case of emergency Date Initiated: 02/11/2015 Goal Status: Active Interventions: Assess fall risk on admission and as needed Provide education on fall prevention Provide education on safe transfers Treatment Activities: Patient referred to home care : 03/18/2015 Notes: Nutrition Nursing Diagnoses: Imbalanced nutrition Potential for alteratiion in Nutrition/Potential for imbalanced nutrition Friesenhahn, Kambryn ([id number]) Goals: Patient/caregiver verbalizes understanding of need to maintain therapeutic glucose control per primary  care physician Date Initiated: 02/11/2015 Goal Status: Active Patient/caregiver will maintain therapeutic glucose control Date Initiated: 02/11/2015 Goal Status: Active Interventions: Assess HgA1c results as ordered upon admission and as needed Provide education on elevated blood sugars and impact on wound healing Provide education on nutrition Treatment Activities: Obtain HgA1c : 03/18/2015 Notes: Wound/Skin Impairment Nursing Diagnoses: Impaired tissue integrity Knowledge deficit related to ulceration/compromised skin integrity Goals: Patient/caregiver will verbalize understanding of skin care regimen Date Initiated: 02/11/2015 Goal Status: Active Ulcer/skin breakdown will heal within 14 weeks Date Initiated: 02/11/2015 Goal Status: Active Interventions: Assess patient/caregiver ability to obtain necessary supplies Assess patient/caregiver ability to perform ulcer/skin care regimen upon admission and as needed Assess ulceration(s) every visit Provide education on ulcer and skin care Treatment Activities: Skin care regimen initiated : 03/18/2015 Topical wound management initiated : 03/18/2015 Notes: TRENITY, KURIAN ([id number]) Electronic Signature(s) Signed: 03/18/2015 5:19:44 PM By: Renee Harder RN Entered By: Renee Harder on 03/18/2015 11:32:59 [name] ([id number]) -------------------------------------------------------------------------------- Patient/Caregiver Education Details Patient Name: [name] Date of Service: 03/18/2015 11:00 AM Medical Record Number: [id number] Patient Account Number: [id number] Date of Birth/Gender: December 29, 1920 (81 y.o. Female) Treating RN: Curtis Sites Primary Care Physician: Dorothey Baseman Other Clinician: Referring Physician: Dorothey Baseman Treating Physician/Extender: Rudene Re in Treatment: 5 Education Assessment Education Provided To: Patient and Caregiver Education Topics Provided Wound/Skin  Impairment: Handouts: Other: wound care as  ordered Methods: Demonstration, Explain/Verbal Responses: State content correctly Electronic Signature(s) Signed: 03/18/2015 12:27:40 PM By: Curtis Sites Entered By: Curtis Sites on 03/18/2015 12:27:39 [name] ([id number]) -------------------------------------------------------------------------------- Wound Assessment Details Patient Name: [name] Date of Service: 03/18/2015 11:00 AM Medical Record Number: [id number] Patient Account Number: [id number] Date of Birth/Sex: Sep 07, 1921 (81 y.o. 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 Wound Open Wounding Event: Gradually Appeared Status: Date Acquired: 11/04/2014 Comorbid Arrhythmia, Deep Vein Thrombosis, Weeks Of Treatment: 5 History: Peripheral Arterial Disease, Type II Clustered Wound: No Diabetes Photos Photo Uploaded By: Curtis Sites on 03/18/2015 12:38:46 Wound Measurements Length: (cm) 1.9 % Reduction i Width: (cm) 2.3 % Reduction i Depth: (cm) 0.2 Epithelializa Area: (cm) 3.432 Tunneling: Volume: (cm) 0.686 Undermining: n Area: 37.6% n Volume: -24.7% tion: None No No Wound Description Full Thickness Without Foul Odor Aft Classification: Exposed Support Structures Diabetic Severity Grade 1 (Wagner): Wound Margin: Flat and Intact Exudate Amount: Large Exudate Type: Purulent Exudate Color: yellow, brown, green er Cleansing: No Wound Bed Granulation Amount: Medium (34-66%) Exposed Structure Bluemel, Kara ([id number]) Necrotic Amount: Medium (34-66%) Fascia Exposed: No Necrotic Quality: Adherent Slough Fat Layer Exposed: No Tendon Exposed: No Muscle Exposed: No Joint Exposed:  No Bone Exposed: No Limited to Skin Breakdown Periwound Skin Texture Texture Color No Abnormalities Noted: No No Abnormalities Noted: No Callus: No Atrophie Blanche: No Crepitus: No Cyanosis: No Excoriation: No Ecchymosis: No Fluctuance: No Erythema: Yes Friable: No Erythema Location: Circumferential Induration: No Hemosiderin Staining: No Localized Edema: No Mottled: No Rash: No Pallor: No Scarring: No Rubor: No Moisture Temperature / Pain No Abnormalities Noted: No Temperature: No Abnormality Dry / Scaly: No Tenderness on Palpation: Yes Maceration: No Moist: No Wound Preparation Ulcer Cleansing: Rinsed/Irrigated with Saline Topical Anesthetic Applied: Other: lidocaine 4%, Treatment Notes Wound #1 (Right Calcaneous) 1. Cleansed with: Clean wound with Normal Saline 2. Anesthetic Topical Lidocaine 4% cream to wound bed prior to debridement 4. Dressing Applied: Santyl Ointment 5. Secondary Dressing Applied Kerlix/Conform Non-Adherent pad 7. Secured with Secretary/administrator) Signed: 03/18/2015 11:36:12 AM By: Mechele Dawley, Jaydin ([id number]) Entered By: Curtis Sites on 03/18/2015 11:36:12 [name] ([id number]) -------------------------------------------------------------------------------- Wound Assessment Details Patient Name: [name] Date of Service: 03/18/2015 11:00 AM Medical Record Number: [id number] Patient Account Number: [id number] Date of Birth/Sex: 12-31-1920 (81 y.o. Female) Treating RN: Curtis Sites Primary Care Physician: Dorothey Baseman Other Clinician: Referring Physician: Terance Hart, DAVID Treating Physician/Extender: Rudene Re in Treatment: 5 Wound Status Wound Number: 2 Primary Arterial Insufficiency Ulcer Etiology: Wound Location: Right Toe Third Wound Open Wounding Event: Gradually Appeared Status: Date Acquired: 11/04/2014 Comorbid Arrhythmia, Deep Vein Thrombosis, Weeks Of Treatment:  5 History: Peripheral Arterial Disease, Type II Clustered Wound: No Diabetes Photos Photo Uploaded By: Curtis Sites on 03/18/2015 12:39:22 Wound Measurements Length: (cm) 1.1 Width: (cm) 1 Depth: (cm) 0.1 Area: (cm) 0.864 Volume: (cm) 0.086 % Reduction in Area: 8.3% % Reduction in Volume: 8.5% Epithelialization: None Tunneling: No Undermining: No Wound Description Full Thickness Without Classification: Exposed Support Structures Diabetic Severity Grade 1 (Wagner): Wound Margin: Flat and Intact Exudate Amount: Small Exudate Type: Purulent Exudate Color: yellow, brown, green Foul Odor After Cleansing: No Wound Bed Granulation Amount: None Present (0%) Exposed Structure Ramseyer, Aiyonna ([id number]) Necrotic Amount: Large (67-100%) Fascia Exposed: No Necrotic Quality: Adherent Slough Fat Layer Exposed: No  Tendon Exposed: No Muscle Exposed: No Joint Exposed: No Bone Exposed: No Limited to Skin Breakdown Periwound Skin Texture Texture Color No Abnormalities Noted: No No Abnormalities Noted: No Callus: No Atrophie Blanche: No Crepitus: No Cyanosis: No Excoriation: No Ecchymosis: No Fluctuance: No Erythema: Yes Friable: No Erythema Location: Circumferential Induration: No Hemosiderin Staining: No Localized Edema: No Mottled: No Rash: No Pallor: No Scarring: No Rubor: No Moisture Temperature / Pain No Abnormalities Noted: No Temperature: No Abnormality Dry / Scaly: No Tenderness on Palpation: Yes Maceration: No Moist: No Wound Preparation Ulcer Cleansing: Rinsed/Irrigated with Saline Topical Anesthetic Applied: Other: lidocaine 4%, Treatment Notes Wound #2 (Right Toe Third) 1. Cleansed with: Clean wound with Normal Saline 2. Anesthetic Topical Lidocaine 4% cream to wound bed prior to debridement 4. Dressing Applied: Santyl Ointment 5. Secondary Dressing Applied Kerlix/Conform Non-Adherent pad 7. Secured with Music therapist) Signed: 03/18/2015 11:36:31 AM By: Mechele Dawley, Mandi ([id number]) Entered By: Curtis Sites on 03/18/2015 11:36:31 [name] ([id number]) -------------------------------------------------------------------------------- Wound Assessment Details Patient Name: [name] Date of Service: 03/18/2015 11:00 AM Medical Record Number: [id number] Patient Account Number: [id number] Date of Birth/Sex: 17-May-1921 (81 y.o. Female) Treating RN: Curtis Sites Primary Care Physician: Dorothey Baseman Other Clinician: Referring Physician: Terance Hart, DAVID Treating Physician/Extender: Rudene Re in Treatment: 5 Wound Status Wound Number: 3 Primary Dehisced Wound Etiology: Wound Location: Left Amputation Site - Below Knee Wound Open Status: Wounding Event: Surgical Injury Comorbid Arrhythmia, Deep Vein Thrombosis, Date Acquired: 01/03/2015 History: Peripheral Arterial Disease, Type II Weeks Of Treatment: 4 Diabetes Clustered Wound: No Photos Photo Uploaded By: Curtis Sites on 03/18/2015 12:39:59 Wound Measurements Length: (cm) 1.4 Width: (cm) 6.2 Depth: (cm) 1 Area: (cm) 6.817 Volume: (cm) 6.817 % Reduction in Area: 34.2% % Reduction in Volume: -64.4% Epithelialization: None Tunneling: No Undermining: No Wound Description Full Thickness Without Classification: Exposed Support Structures Diabetic Severity Grade 2 (Wagner): Wound Margin: Distinct, outline attached Exudate Amount: Large Exudate Type: Serous Exudate Color: amber Foul Odor After Cleansing: No Wound Bed Granulation Amount: Small (1-33%) Exposed Structure Cuello, Lacey ([id number]) Necrotic Amount: Large (67-100%) Fascia Exposed: No Necrotic Quality: Adherent Slough Fat Layer Exposed: No Tendon Exposed: No Muscle Exposed: No Joint Exposed: No Bone Exposed: No Limited to Skin Breakdown Periwound Skin Texture Texture Color No Abnormalities Noted: No No  Abnormalities Noted: No Callus: No Atrophie Blanche: No Crepitus: No Cyanosis: No Excoriation: No Ecchymosis: No Fluctuance: No Erythema: No Friable: No Hemosiderin Staining: No Induration: No Mottled: No Localized Edema: No Pallor: No Rash: No Rubor: No Scarring: No Temperature / Pain Moisture Temperature: No Abnormality No Abnormalities Noted: No Tenderness on Palpation: Yes Dry / Scaly: No Maceration: No Moist: No Wound Preparation Ulcer Cleansing: Rinsed/Irrigated with Saline Topical Anesthetic Applied: Other: lidocaine 4%, Treatment Notes Wound #3 (Left Amputation Site - Below Knee) 1. Cleansed with: Clean wound with Normal Saline 2. Anesthetic Topical Lidocaine 4% cream to wound bed prior to debridement 4. Dressing Applied: Iodoform packing Gauze 5. Secondary Dressing Applied Non-Adherent pad 7. Secured with Secretary/administrator) Signed: 03/18/2015 11:36:47 AM By: Curtis Sites Entered By: Curtis Sites on 03/18/2015 11:36:47 [name] ([id number] [place], Eyvonne Left ([id number]) -------------------------------------------------------------------------------- Wound Assessment Details Patient Name: [name] Date of Service: 03/18/2015 11:00 AM Medical Record Number: [id number] Patient Account Number: [id number] Date of Birth/Sex: [birth year] (81 y.o. Female) Treating RN: Curtis Sites Primary Care Physician: Dorothey Baseman Other Clinician: Referring Physician: Dorothey Baseman Treating Physician/Extender: Rudene Re in Treatment: 5 Wound Status Wound Number: 4 Primary Diabetic  Wound/Ulcer of the Lower Etiology: Extremity Wound Location: Right Toe Third - Medial Wound Open Wounding Event: Gradually Appeared Status: Date Acquired: 02/25/2015 Comorbid Arrhythmia, Deep Vein Thrombosis, Weeks Of Treatment: 1 History: Peripheral Arterial Disease, Type II Clustered Wound: No Diabetes Photos Photo Uploaded By: Curtis Sites on  03/18/2015 12:40:25 Wound Measurements Length: (cm) 0.3 Width: (cm) 0.4 Depth: (cm) 0.1 Area: (cm) 0.094 Volume: (cm) 0.009 % Reduction in Area: 25.4% % Reduction in Volume: 64% Epithelialization: None Tunneling: No Undermining: No Wound Description Classification: Grade 2 Wound Margin: Distinct, outline attached Exudate Amount: Small Exudate Type: Serous Exudate Color: amber Wound Bed Granulation Amount: None Present (0%) Exposed Structure Necrotic Amount: Large (67-100%) Fascia Exposed: No Necrotic Quality: Adherent Slough Fat Layer Exposed: No Tendon Exposed: No Fulmore, Avalynn ([id number]) Muscle Exposed: No Joint Exposed: No Bone Exposed: No Limited to Skin Breakdown Periwound Skin Texture Texture Color No Abnormalities Noted: No No Abnormalities Noted: No Callus: No Atrophie Blanche: No Crepitus: No Cyanosis: No Excoriation: No Ecchymosis: No Fluctuance: No Erythema: No Friable: No Hemosiderin Staining: No Induration: No Mottled: No Localized Edema: No Pallor: No Rash: No Rubor: No Scarring: No Temperature / Pain Moisture Temperature: No Abnormality No Abnormalities Noted: No Tenderness on Palpation: Yes Dry / Scaly: No Maceration: No Moist: No Wound Preparation Ulcer Cleansing: Rinsed/Irrigated with Saline Topical Anesthetic Applied: Other: liodocaine 4%, Treatment Notes Wound #4 (Right, Medial Toe Third) 1. Cleansed with: Clean wound with Normal Saline 2. Anesthetic Topical Lidocaine 4% cream to wound bed prior to debridement 4. Dressing Applied: Santyl Ointment 5. Secondary Dressing Applied Kerlix/Conform Non-Adherent pad 7. Secured with Secretary/administrator) Signed: 03/18/2015 11:37:06 AM By: Curtis Sites Entered By: Curtis Sites on 03/18/2015 11:37:06 [name] ([id number]) -------------------------------------------------------------------------------- Vitals Details Patient Name: [name] Date of  Service: 03/18/2015 11:00 AM Medical Record Number: [id number] Patient Account Number: [id number] Date of Birth/Sex: 1921-01-29 (81 y.o. Female) Treating RN: Curtis Sites Primary Care Physician: Dorothey Baseman Other Clinician: Referring Physician: Terance Hart, DAVID Treating Physician/Extender: Rudene Re in Treatment: 5 Vital Signs Time Taken: 11:16 Temperature (F): 97.8 Height (in): 62 Pulse (bpm): 61 Weight (lbs): 155 Respiratory Rate (breaths/min): 18 Body Mass Index (BMI): 28.3 Blood Pressure (mmHg): 128/57 Reference Range: 80 - 120 mg / dl Electronic Signature(s) Signed: 03/18/2015 5:20:40 PM By: Curtis Sites Entered By: Curtis Sites on 03/18/2015 11:16:39

## 2015-03-20 NOTE — Progress Notes (Signed)
[name], [name] ([id number]) Visit Report for 03/18/2015 Chief Complaint Document Details Patient Name: [name], [name] Date of Service: 03/18/2015 11:00 AM Medical Record Number: [id number] Patient Account Number: [id number] Date of Birth/Sex: March 23, 1921 (81 y.o. Female) Treating RN: Primary Care Physician: Terance Hart, DAVID Other Clinician: Referring Physician: Terance Hart, DAVID Treating Physician/Extender: Rudene Re in Treatment: 5 Information Obtained from: Patient Chief Complaint Patient presents to the wound care center for a consult due non healing wound. 81 year old patient with comes with a history of a ulcerated area to the right third toe and the right heel which she's had for about 2 months. Electronic Signature(s) Signed: 03/18/2015 12:24:35 PM By: Evlyn Kanner MD, FACS Entered By: Evlyn Kanner on 03/18/2015 12:02:27 [name] ([id number]) -------------------------------------------------------------------------------- Debridement Details Patient Name: [name] Date of Service: 03/18/2015 11:00 AM Medical Record Number: [id number] Patient Account Number: [id number] Date of Birth/Sex: 10-31-1920 (81 y.o. Female) Treating RN: Primary Care Physician: Terance Hart, DAVID Other Clinician: Referring Physician: Terance Hart, DAVID Treating Physician/Extender: Rudene Re in Treatment: 5 Debridement Performed for Wound #1 Right Calcaneous Assessment: Performed By: Physician Tristan Schroeder., MD Debridement: Debridement Pre-procedure Yes Verification/Time Out Taken: Start Time: 11:55 Pain Control: Lidocaine 4% Topical Solution Level: Skin/Subcutaneous Tissue Total Area Debrided (L x 1.9 (cm) x 2.3 (cm) = 4.37 (cm) W): Tissue and other Viable, Non-Viable, Eschar, Exudate, Fibrin/Slough, Subcutaneous material debrided: Instrument: Curette Bleeding: Minimum Hemostasis Achieved: Pressure End Time: 11:57 Procedural Pain: 2 Post Procedural Pain:  2 Response to Treatment: Procedure was tolerated well Post Debridement Measurements of Total Wound Length: (cm) 1.9 Width: (cm) 2.3 Depth: (cm) 0.2 Volume: (cm) 0.686 Electronic Signature(s) Signed: 03/18/2015 12:24:35 PM By: Evlyn Kanner MD, FACS Entered By: Evlyn Kanner on 03/18/2015 12:01:36 [name] ([id number]) -------------------------------------------------------------------------------- Debridement Details Patient Name: [name] Date of Service: 03/18/2015 11:00 AM Medical Record Number: [id number] Patient Account Number: [id number] Date of Birth/Sex: 1920-12-29 (81 y.o. Female) Treating RN: Primary Care Physician: Terance Hart, DAVID Other Clinician: Referring Physician: Terance Hart, DAVID Treating Physician/Extender: Rudene Re in Treatment: 5 Debridement Performed for Wound #2 Right Toe Third Assessment: Performed By: Physician Tristan Schroeder., MD Debridement: Debridement Pre-procedure Yes Verification/Time Out Taken: Start Time: 11:53 Pain Control: Lidocaine 4% Topical Solution Level: Skin/Subcutaneous Tissue Total Area Debrided (L x 1.1 (cm) x 1 (cm) = 1.1 (cm) W): Tissue and other Viable, Non-Viable, Fibrin/Slough, Subcutaneous material debrided: Instrument: Curette Bleeding: Minimum Hemostasis Achieved: Pressure End Time: 11:54 Response to Treatment: Procedure was tolerated well Post Debridement Measurements of Total Wound Length: (cm) 1.1 Width: (cm) 1 Depth: (cm) 0.1 Volume: (cm) 0.086 Electronic Signature(s) Signed: 03/18/2015 12:24:35 PM By: Evlyn Kanner MD, FACS Entered By: Evlyn Kanner on 03/18/2015 12:01:55 [name] ([id number]) -------------------------------------------------------------------------------- Debridement Details Patient Name: [name] Date of Service: 03/18/2015 11:00 AM Medical Record Number: [id number] Patient Account Number: [id number] Date of Birth/Sex: 1921/03/27 (81 y.o.  Female) Treating RN: Primary Care Physician: Terance Hart, DAVID Other Clinician: Referring Physician: Terance Hart, DAVID Treating Physician/Extender: Rudene Re in Treatment: 5 Debridement Performed for Wound #3 Left Amputation Site - Below Knee Assessment: Performed By: Physician Tristan Schroeder., MD Debridement: Debridement Pre-procedure Yes Verification/Time Out Taken: Start Time: 11:51 Pain Control: Lidocaine 4% Topical Solution Level: Skin/Subcutaneous Tissue Total Area Debrided (L x 1.4 (cm) x 6.2 (cm) = 8.68 (cm) W): Tissue and other Viable, Non-Viable, Fibrin/Slough, Subcutaneous material debrided: Instrument: Curette Bleeding: Minimum Hemostasis Achieved: Pressure End Time: 11:53 Procedural Pain: 2 Post Procedural Pain: 2 Response to Treatment: Procedure was tolerated well Post Debridement Measurements of Total  Wound Length: (cm) 1.4 Width: (cm) 6.2 Depth: (cm) 1.1 Volume: (cm) 7.499 Electronic Signature(s) Signed: 03/18/2015 12:24:35 PM By: Evlyn Kanner MD, FACS Entered By: Evlyn Kanner on 03/18/2015 12:02:19 [name] ([id number]) -------------------------------------------------------------------------------- HPI Details Patient Name: [name] Date of Service: 03/18/2015 11:00 AM Medical Record Number: [id number] Patient Account Number: [id number] Date of Birth/Sex: 12-09-1920 (81 y.o. Female) Treating RN: Primary Care Physician: Terance Hart, DAVID Other Clinician: Referring Physician: Terance Hart, DAVID Treating Physician/Extender: Rudene Re in Treatment: 5 History of Present Illness Location: right medial heel and right third toe Quality: Patient reports experiencing a dull pain to affected area(s). Severity: Patient states wound are getting worse. Duration: Patient has had the wound for > 3 months prior to seeking treatment at the wound center Timing: Pain in wound is Intermittent (comes and goes Context: The wound appeared  gradually over time Modifying Factors: Consults to this date include:vascular surgeon and several recent surgeries. Associated Signs and Symptoms: Patient reports having foul odor and drainage from the left below-knee amputation site. HPI Description: A pleasant 81 year old patient who is known to have diabetes mellitus for several years has recently gone through a series of operations with the vascular surgeon Dr. Gilda Crease. In January and February she had several surgeries on the left lower extremity with an attempt to have limb salvage for gangrenous changes of her forefoot. 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 On 01/07/2015 she also had a right lower extremity distal runoff with a angioplasty of the right anterior tibial artery to maximize her blood flow to the foot. She recently had her staples removed at Dr. Marijean Heath office on 01/31/2015 and at that time a right lower extremity duplex was done which showed patent vessels and a patent stent with distal occluded posterior tibial artery. Though the duplex was noncritical the recommendations from the PA at the vascular surgery office was that of a angiogram to be done but the patient said she would rather try some bone care before. The patient has received doxycycline and Cipro in the recent past and she takes oral medications for her diabetes. Other than that I reviewed her list of all her medications. No recent hemoglobin A1c has been done and no recent x-rays of the right foot have been done. 02/18/2015 -- x-ray of the right foot was done on 02/11/2015 and it shows no evidence of acute osteomyelitis of the third toe or of the calcaneus. She had gone to the vascular surgery office and they had noted that there is dehiscence of the left part of the amputation site of the below-knee wound and they have asked [place] to kindly take over the care of this. There've been  doing dressings for the right heel and right third toe. 02/25/2015 -- we have received notes from the vascular group who saw her last on 02/13/2015 and she was seen by the PA Ms. Cleda Daub. She had recommended that the patient continue to follow with [place] for wound care including management of the left BKA stump, which has had dehiscence of the lateral part. They will consider repeating a arterial duplex or angiogram if the right lower extremity does not heal within a reasonable period of time. 03/18/2015 -- he saw the vascular surgeon Dr. Gilda Crease and he has set her up for an angioplasty sometime in the middle of July. she is doing well otherwise. [name], [name] ([id number]) Electronic Signature(s) Signed: 03/18/2015 12:24:35 PM By: Evlyn Kanner  MD, FACS Entered By: Evlyn Kanner on 03/18/2015 12:03:21 [name] ([id number]) -------------------------------------------------------------------------------- Physical Exam Details Patient Name: [name] Date of Service: 03/18/2015 11:00 AM Medical Record Number: [id number] Patient Account Number: [id number] Date of Birth/Sex: [birth year] (81 y.o. Female) Treating RN: Primary Care Physician: Terance Hart, DAVID Other Clinician: Referring Physician: Terance Hart, DAVID Treating Physician/Extender: Rudene Re in Treatment: 5 Constitutional . Pulse regular. Respirations normal and unlabored. Afebrile. . Eyes Nonicteric. Reactive to light. Ears, Nose, Mouth, and Throat Lips, teeth, and gums WNL.Marland Kitchen Moist mucosa without lesions . Neck supple and nontender. No palpable supraclavicular or cervical adenopathy. Normal sized without goiter. Respiratory WNL. No retractions.. Cardiovascular Pedal Pulses weakly palpable.. minimal edema right lower extremity.. Integumentary (Hair, Skin) all the wounds have some debris and slough which will be sharply removed with a curette.Marland Kitchen No crepitus or fluctuance. No peri-wound warmth or  erythema. No masses.Marland Kitchen Psychiatric Judgement and insight Intact.. No evidence of depression, anxiety, or agitation.. Electronic Signature(s) Signed: 03/18/2015 12:24:35 PM By: Evlyn Kanner MD, FACS Entered By: Evlyn Kanner on 03/18/2015 12:04:19 [name] ([id number]) -------------------------------------------------------------------------------- Physician Orders Details Patient Name: [name] Date of Service: 03/18/2015 11:00 AM Medical Record Number: [id number] Patient Account Number: [id number] Date of Birth/Sex: 1921/09/20 (81 y.o. Female) Treating RN: Renee Harder Primary Care Physician: Dorothey Baseman Other Clinician: Referring Physician: Dorothey Baseman Treating Physician/Extender: Rudene Re in Treatment: 5 Verbal / Phone Orders: Yes Clinician: Renee Harder Read Back and Verified: Yes Diagnosis Coding Wound Cleansing Wound #1 Right Calcaneous o Clean wound with Normal Saline. o Clean wound with Normal Saline. Wound #2 Right Toe Third o Clean wound with Normal Saline. o Clean wound with Normal Saline. Wound #3 Left Amputation Site - Below Knee o Clean wound with Normal Saline. o Clean wound with Normal Saline. Wound #4 Right,Medial Toe Third o Clean wound with Normal Saline. o Clean wound with Normal Saline. Anesthetic Wound #1 Right Calcaneous o Topical Lidocaine 4% cream applied to wound bed prior to debridement Wound #2 Right Toe Third o Topical Lidocaine 4% cream applied to wound bed prior to debridement Wound #3 Left Amputation Site - Below Knee o Topical Lidocaine 4% cream applied to wound bed prior to debridement Wound #4 Right,Medial Toe Third o Topical Lidocaine 4% cream applied to wound bed prior to debridement Primary Wound Dressing Wound #1 Right Calcaneous o Santyl Ointment Wound #2 Right Toe Third o Santyl Ointment Faro, Mianna ([id number]) Wound #3 Left Amputation Site - Below Knee o Pack  wound with: - iodoform packing strip Wound #4 Right,Medial Toe Third o Santyl Ointment Secondary Dressing Wound #1 Right Calcaneous o Gauze and Kerlix/Conform Wound #2 Right Toe Third o Gauze and Kerlix/Conform Wound #3 Left Amputation Site - Below Knee o Gauze and Kerlix/Conform Wound #4 Right,Medial Toe Third o Gauze and Kerlix/Conform Dressing Change Frequency Wound #1 Right Calcaneous o Change dressing every day. Wound #2 Right Toe Third o Change dressing every day. Wound #3 Left Amputation Site - Below Knee o Change dressing every day. Wound #4 Right,Medial Toe Third o Change dressing every day. Follow-up Appointments Wound #1 Right Calcaneous o Return Appointment in 1 week. Wound #2 Right Toe Third o Return Appointment in 1 week. Wound #3 Left Amputation Site - Below Knee o Return Appointment in 1 week. Wound #4 Right,Medial Toe Third o Return Appointment in 1 week. Off-Loading Wound #1 Right Calcaneous [name], [name] ([id number]) o Open toe surgical shoe to: - right foot o Other: - Offloading boot at night right foot Home  Health Wound #1 Right Calcaneous o Initiate Home Health for Skilled Nursing - Continue Aldine Contes o Continue Home Health Visits o Home Health Nurse may visit PRN to address patientos wound care needs. o FACE TO FACE ENCOUNTER: MEDICARE and MEDICAID PATIENTS: I certify that this patient is under my care and that I had a face-to-face encounter that meets the physician face-to-face encounter requirements with this patient on this date. The encounter with the patient was in whole or in part for the following MEDICAL CONDITION: (primary reason for Home Healthcare) MEDICAL NECESSITY: I certify, that based on my findings, NURSING services are a medically necessary home health service. HOME BOUND STATUS: I certify that my clinical findings support that this patient is homebound (i.e., Due to illness or injury, pt  requires aid of supportive devices such as crutches, cane, wheelchairs, walkers, the use of special transportation or the assistance of another person to leave their place of residence. There is a normal inability to leave the home and doing so requires considerable and taxing effort. Other absences are for medical reasons / religious services and are infrequent or of short duration when for other reasons). o If current dressing causes regression in wound condition, may D/C ordered dressing product/s and apply Normal Saline Moist Dressing daily until next Wound Healing Center / Other MD appointment. Notify Wound Healing Center of regression in wound condition at [phone]. o Please direct any NON-WOUND related issues/requests for orders to patient's Primary Care Physician Wound #2 Right Toe Third o Initiate Home Health for Skilled Nursing - Continue Tomasa Hosteller Continue Home Health Visits o Home Health Nurse may visit PRN to address patientos wound care needs. o FACE TO FACE ENCOUNTER: MEDICARE and MEDICAID PATIENTS: I certify that this patient is under my care and that I had a face-to-face encounter that meets the physician face-to-face encounter requirements with this patient on this date. The encounter with the patient was in whole or in part for the following MEDICAL CONDITION: (primary reason for Home Healthcare) MEDICAL NECESSITY: I certify, that based on my findings, NURSING services are a medically necessary home health service. HOME BOUND STATUS: I certify that my clinical findings support that this patient is homebound (i.e., Due to illness or injury, pt requires aid of supportive devices such as crutches, cane, wheelchairs, walkers, the use of special transportation or the assistance of another person to leave their place of residence. There is a normal inability to leave the home and doing so requires considerable and taxing effort. Other absences are for medical  reasons / religious services and are infrequent or of short duration when for other reasons). o If current dressing causes regression in wound condition, may D/C ordered dressing product/s and apply Normal Saline Moist Dressing daily until next Wound Healing Center / Other MD appointment. Notify Wound Healing Center of regression in wound condition at [phone]. o Please direct any NON-WOUND related issues/requests for orders to patient's Primary Care Physician Wound #3 Left Amputation Site - Below Knee [name], [name] ([id number]) o Initiate Home Health for Skilled Nursing - Continue Tomasa Hosteller Continue Home Health Visits o Home Health Nurse may visit PRN to address patientos wound care needs. o FACE TO FACE ENCOUNTER: MEDICARE and MEDICAID PATIENTS: I certify that this patient is under my care and that I had a face-to-face encounter that meets the physician face-to-face encounter requirements with this patient on this date. The encounter with the patient was in whole or in part for the following MEDICAL CONDITION: (primary  reason for Home Healthcare) MEDICAL NECESSITY: I certify, that based on my findings, NURSING services are a medically necessary home health service. HOME BOUND STATUS: I certify that my clinical findings support that this patient is homebound (i.e., Due to illness or injury, pt requires aid of supportive devices such as crutches, cane, wheelchairs, walkers, the use of special transportation or the assistance of another person to leave their place of residence. There is a normal inability to leave the home and doing so requires considerable and taxing effort. Other absences are for medical reasons / religious services and are infrequent or of short duration when for other reasons). o If current dressing causes regression in wound condition, may D/C ordered dressing product/s and apply Normal Saline Moist Dressing daily until next Wound Healing Center /  Other MD appointment. Notify Wound Healing Center of regression in wound condition at [phone]. o Please direct any NON-WOUND related issues/requests for orders to patient's Primary Care Physician Medications-please add to medication list. Wound #1 Right Calcaneous o Santyl Enzymatic Ointment o Other: - OTC multivitamin with zinc, copper, and selenium Wound #2 Right Toe Third o Santyl Enzymatic Ointment o Other: - OTC multivitamin with zinc, copper, and selenium Wound #4 Right,Medial Toe Third o Santyl Enzymatic Ointment o Other: - OTC multivitamin with zinc, copper, and selenium Wound #3 Left Amputation Site - Below Knee o Other: - OTC multivitamin with zinc, copper, and selenium Electronic Signature(s) Signed: 03/18/2015 12:24:35 PM By: Evlyn Kanner MD, FACS Signed: 03/18/2015 5:19:44 PM By: Renee Harder RN Entered By: Renee Harder on 03/18/2015 12:00:58 [name] ([id number]) -------------------------------------------------------------------------------- Problem List Details Patient Name: [name] Date of Service: 03/18/2015 11:00 AM Medical Record Number: [id number] Patient Account Number: [id number] Date of Birth/Sex: 1921/08/10 (81 y.o. 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 L97.812 Non-pressure chronic ulcer of other part of right lower leg 02/11/2015 Yes with fat layer exposed T81.31XA Disruption of  external operation (surgical) wound, not 02/18/2015 Yes elsewhere classified, initial encounter Inactive Problems Resolved Problems Electronic Signature(s) Signed: 03/18/2015 12:24:35 PM By: Evlyn Kanner MD, FACS Entered By: Evlyn Kanner on 03/18/2015 12:01:08 [name] ([id number]) -------------------------------------------------------------------------------- Progress Note Details Patient Name: [name] Date of Service: 03/18/2015 11:00 AM Medical Record Number: [id number] Patient Account Number: [id number] Date of Birth/Sex: 11-25-1920 (81 y.o. Female) Treating RN: Primary Care Physician: Terance Hart, DAVID Other Clinician: Referring Physician: Terance Hart, DAVID Treating Physician/Extender: Rudene Re in Treatment: 5 Subjective Chief Complaint Information obtained from Patient Patient presents to the wound care center for a consult due non healing wound. 81 year old patient with comes with a history of a ulcerated area to the right third toe and the right heel which she's had for about 2 months. History of Present Illness (HPI) The following HPI elements were documented for the patient's wound: Location: right medial heel and right third toe Quality: Patient reports experiencing a dull pain to affected area(s). Severity: Patient states wound are getting worse. Duration: Patient has had the wound for > 3 months prior to seeking treatment at the wound center Timing: Pain in  wound is Intermittent (comes and goes Context: The wound appeared gradually over time Modifying Factors: Consults to this date include:vascular surgeon and several recent surgeries. Associated Signs and Symptoms: Patient reports having foul odor and drainage from the left below-knee amputation site. A pleasant 81 year old patient who is known to have diabetes mellitus for several years has recently gone through a series of operations with the vascular surgeon Dr. Gilda Crease. In January and  February she had several surgeries on the left lower extremity with an attempt to have limb salvage for gangrenous changes of her forefoot. 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 On 01/07/2015 she also had a right lower extremity distal runoff with a angioplasty of the right anterior tibial artery to maximize her blood flow to the foot. She recently had her staples removed at Dr. Marijean Heath office on 01/31/2015 and at that time a right lower extremity duplex was done which showed patent vessels and a patent stent with distal occluded posterior tibial artery. Though the duplex was noncritical the recommendations from the PA at the vascular surgery office was that of a angiogram to be done but the patient said she would rather try some bone care before. The patient has received doxycycline and Cipro in the recent past and she takes oral medications for her diabetes. Other than that I reviewed her list of all her medications. No recent hemoglobin A1c has been done and no recent x-rays of the right foot have been done. 02/18/2015 -- x-ray of the right foot was done on 02/11/2015 and it shows no evidence of acute osteomyelitis of the third toe or of the calcaneus. She had gone to the vascular surgery office and they had noted that there is dehiscence of the left part of the amputation site of the below-knee wound and they have asked [place] to kindly take over the care of this. There've been doing dressings for the right heel and right third toe. KHALIDAH, HERBOLD ([id number]) 02/25/2015 -- we have received notes from the vascular group who saw her last on 02/13/2015 and she was seen by the PA Ms. Cleda Daub. She had recommended that the patient continue to follow with [place] for wound care including management of the left BKA stump, which has had dehiscence of the lateral part. They will consider repeating a arterial duplex  or angiogram if the right lower extremity does not heal within a reasonable period of time. 03/18/2015 -- he saw the vascular surgeon Dr. Gilda Crease and he has set her up for an angioplasty sometime in the middle of July. she is doing well otherwise. Objective Constitutional Pulse regular. Respirations normal and unlabored. Afebrile. Vitals Time Taken: 11:16 AM, Height: 62 in, Weight: 155 lbs, BMI: 28.3, Temperature: 97.8 F, Pulse: 61 bpm, Respiratory Rate: 18 breaths/min, Blood Pressure: 128/57 mmHg. Eyes Nonicteric. Reactive to light. Ears, Nose, Mouth, and Throat Lips, teeth, and gums WNL.Marland Kitchen Moist mucosa without lesions . Neck supple and nontender. No palpable supraclavicular or cervical adenopathy. Normal sized without goiter. Respiratory WNL. No retractions.. Cardiovascular Pedal Pulses weakly palpable.. minimal edema right lower extremity.Marland Kitchen Psychiatric Judgement and insight Intact.. No evidence of depression, anxiety, or agitation.. Integumentary (Hair, Skin) all the wounds have some debris and slough which will be sharply removed with a curette.Marland Kitchen No crepitus or fluctuance. No peri-wound warmth or erythema. No masses.. Wound #1 status is Open. Original cause of wound was Gradually Appeared. The wound is located on the  Right Calcaneous. The wound measures 1.9cm length x 2.3cm width x 0.2cm depth; 3.432cm^2 area and Arnesen, Hellena ([id number]) 0.686cm^3 volume. The wound is limited to skin breakdown. There is no tunneling or undermining noted. There is a large amount of purulent drainage noted. The wound margin is flat and intact. There is medium (34-66%) granulation within the wound bed. There is a medium (34-66%) amount of necrotic tissue within the wound bed including Adherent Slough. The periwound skin appearance exhibited: Erythema. The periwound skin appearance did not exhibit: Callus, Crepitus, Excoriation, Fluctuance, Friable, Induration, Localized Edema, Rash, Scarring,  Dry/Scaly, Maceration, Moist, Atrophie Blanche, Cyanosis, Ecchymosis, Hemosiderin Staining, Mottled, Pallor, Rubor. The surrounding wound skin color is noted with erythema which is circumferential. Periwound temperature was noted as No Abnormality. The periwound has tenderness on palpation. Wound #2 status is Open. Original cause of wound was Gradually Appeared. The wound is located on the Right Toe Third. The wound measures 1.1cm length x 1cm width x 0.1cm depth; 0.864cm^2 area and 0.086cm^3 volume. The wound is limited to skin breakdown. There is no tunneling or undermining noted. There is a small amount of purulent drainage noted. The wound margin is flat and intact. There is no granulation within the wound bed. There is a large (67-100%) amount of necrotic tissue within the wound bed including Adherent Slough. The periwound skin appearance exhibited: Erythema. The periwound skin appearance did not exhibit: Callus, Crepitus, Excoriation, Fluctuance, Friable, Induration, Localized Edema, Rash, Scarring, Dry/Scaly, Maceration, Moist, Atrophie Blanche, Cyanosis, Ecchymosis, Hemosiderin Staining, Mottled, Pallor, Rubor. The surrounding wound skin color is noted with erythema which is circumferential. Periwound temperature was noted as No Abnormality. The periwound has tenderness on palpation. Wound #3 status is Open. Original cause of wound was Surgical Injury. The wound is located on the Left Amputation Site - Below Knee. The wound measures 1.4cm length x 6.2cm width x 1cm depth; 6.817cm^2 area and 6.817cm^3 volume. The wound is limited to skin breakdown. There is no tunneling or undermining noted. There is a large amount of serous drainage noted. The wound margin is distinct with the outline attached to the wound base. There is small (1-33%) granulation within the wound bed. There is a large (67- 100%) amount of necrotic tissue within the wound bed including Adherent Slough. The periwound  skin appearance did not exhibit: Callus, Crepitus, Excoriation, Fluctuance, Friable, Induration, Localized Edema, Rash, Scarring, Dry/Scaly, Maceration, Moist, Atrophie Blanche, Cyanosis, Ecchymosis, Hemosiderin Staining, Mottled, Pallor, Rubor, Erythema. Periwound temperature was noted as No Abnormality. The periwound has tenderness on palpation. Wound #4 status is Open. Original cause of wound was Gradually Appeared. The wound is located on the Right,Medial Toe Third. The wound measures 0.3cm length x 0.4cm width x 0.1cm depth; 0.094cm^2 area and 0.009cm^3 volume. The wound is limited to skin breakdown. There is no tunneling or undermining noted. There is a small amount of serous drainage noted. The wound margin is distinct with the outline attached to the wound base. There is no granulation within the wound bed. There is a large (67-100%) amount of necrotic tissue within the wound bed including Adherent Slough. The periwound skin appearance did not exhibit: Callus, Crepitus, Excoriation, Fluctuance, Friable, Induration, Localized Edema, Rash, Scarring, Dry/Scaly, Maceration, Moist, Atrophie Blanche, Cyanosis, Ecchymosis, Hemosiderin Staining, Mottled, Pallor, Rubor, Erythema. Periwound temperature was noted as No Abnormality. The periwound has tenderness on palpation. 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 L97.812 - Non-pressure chronic ulcer of other part of right lower leg with fat layer exposed T81.31XA - Disruption of external operation (surgical) wound, not elsewhere classified, initial encounter Diagnoses ICD-10 E11.621: Type 2 diabetes mellitus with foot ulcer I70.235:  Atherosclerosis of native arteries of right leg with ulceration of other part of foot Z89.512: Acquired absence of left leg below knee L97.412: Non-pressure chronic ulcer of right heel and midfoot with fat layer exposed L97.812: Non-pressure chronic ulcer of other part of right lower leg with fat layer exposed T81.31XA: Disruption of external operation (surgical) wound, not elsewhere classified, initial encounter The patient's wounds are slowly improving and after sharp debridement we will convert the left below-knee amputation stump wound dressing to a Idoform gauze packing. Santyl will be used on the right heel and on the right toes. Her son and daughter-in-law here today and all questions have been answered. 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 There was a Skin/Subcutaneous Tissue Debridement ([id number]) debridement with total area of 4.37 sq cm performed by Tristan Schroeder., MD. with the following instrument(s): Curette to remove Viable and Non-Viable tissue/material including Exudate, Fibrin/Slough, Eschar, and Subcutaneous after achieving pain control using Lidocaine 4% Topical Solution. A time out was conducted prior to the start of the procedure. A Minimum amount of bleeding was controlled with Pressure. The procedure was tolerated well with a pain level of 2 throughout and a pain level of 2 following the procedure. Post Debridement Measurements: 1.9cm length x 2.3cm width x 0.2cm depth; 0.686cm^3 volume. 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 There was a Skin/Subcutaneous Tissue Debridement ([id number]) debridement with total area of 1.1 sq cm performed by Tristan Schroeder., MD. with the following instrument(s): Curette to remove Viable and Non-Viable tissue/material including Fibrin/Slough and Subcutaneous after achieving pain control using Lidocaine 4% Topical Solution. A  time out was conducted prior to the start of the procedure. A Minimum amount of bleeding was controlled with Pressure. The procedure was tolerated well. Post Debridement [name], [name] ([id number]) Measurements: 1.1cm length x 1cm width x 0.1cm depth; 0.086cm^3 volume. Wound #3 Wound #3 is a Dehisced Wound located on the Left Amputation Site - Below Knee . There was a Skin/Subcutaneous Tissue Debridement ([id number]) debridement with total area of 8.68 sq cm performed by Tristan Schroeder., MD. with the following instrument(s): Curette to remove Viable and Non-Viable tissue/material including Fibrin/Slough and Subcutaneous after achieving pain control using Lidocaine 4% Topical Solution. A time out was conducted prior to the start of the procedure. A Minimum amount of bleeding was controlled with Pressure. The procedure was tolerated well with a pain level of 2 throughout and a pain level of 2 following the procedure. Post Debridement Measurements: 1.4cm length x 6.2cm width x 1.1cm depth; 7.499cm^3 volume. Plan Wound Cleansing: Wound #1 Right Calcaneous: Clean wound with Normal Saline. Clean wound with Normal Saline. Wound #2 Right Toe Third: Clean wound with Normal Saline. Clean wound with Normal Saline. Wound #3 Left Amputation Site - Below Knee: Clean wound with Normal Saline. Clean wound with Normal Saline. Wound #4 Right,Medial Toe Third: Clean wound with Normal Saline. Clean wound with Normal Saline. Anesthetic: Wound #1 Right Calcaneous: Topical Lidocaine 4% cream applied to wound bed  prior to debridement Wound #2 Right Toe Third: Topical Lidocaine 4% cream applied to wound bed prior to debridement Wound #3 Left Amputation Site - Below Knee: Topical Lidocaine 4% cream applied to wound bed prior to debridement Wound #4 Right,Medial Toe Third: Topical Lidocaine 4% cream applied to wound bed prior to debridement Primary Wound Dressing: Wound #1 Right Calcaneous: Santyl  Ointment Wound #2 Right Toe Third: Santyl Ointment Wound #3 Left Amputation Site - Below Knee: Pack wound with: - iodoform packing strip Wound #4 Right,Medial Toe Third: Santyl Ointment Secondary DressingChestina [name], [name] ([id number]) Wound #1 Right Calcaneous: Gauze and Kerlix/Conform Wound #2 Right Toe Third: Gauze and Kerlix/Conform Wound #3 Left Amputation Site - Below Knee: Gauze and Kerlix/Conform Wound #4 Right,Medial Toe Third: Gauze and Kerlix/Conform Dressing Change Frequency: Wound #1 Right Calcaneous: Change dressing every day. Wound #2 Right Toe Third: Change dressing every day. Wound #3 Left Amputation Site - Below Knee: Change dressing every day. Wound #4 Right,Medial Toe Third: Change dressing every day. Follow-up Appointments: Wound #1 Right Calcaneous: Return Appointment in 1 week. Wound #2 Right Toe Third: Return Appointment in 1 week. 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 Wound #4 Right,Medial Toe Third: Return Appointment in 1 week. Off-Loading: Wound #1 Right Calcaneous: Open toe surgical shoe to: - right foot Other: - Offloading boot at night right foot Home Health: Wound #1 Right Calcaneous: Initiate Home Health for Skilled Nursing - Continue [facility] Continue Home Health Visits Home Health Nurse may visit PRN to address patient s wound care needs. FACE TO FACE ENCOUNTER: MEDICARE and MEDICAID PATIENTS: I certify that this patient is under my care and that I had a face-to-face encounter that meets the physician face-to-face encounter requirements with this patient on this date. The encounter with the patient was in whole or in part for the following MEDICAL CONDITION: (primary reason for Home Healthcare) MEDICAL NECESSITY: I certify, that based on my findings, NURSING services are a medically necessary home health service. HOME BOUND STATUS: I certify that my clinical findings support that this patient is  homebound (i.e., Due to illness or injury, pt requires aid of supportive devices such as crutches, cane, wheelchairs, walkers, the use of special transportation or the assistance of another person to leave their place of residence. There is a normal inability to leave the home and doing so requires considerable and taxing effort. Other absences are for medical reasons / religious services and are infrequent or of short duration when for other reasons). If current dressing causes regression in wound condition, may D/C ordered dressing product/s and apply Normal Saline Moist Dressing daily until next Wound Healing Center / Other MD appointment. Notify Wound Healing Center of regression in wound condition at [phone]. Please direct any NON-WOUND related issues/requests for orders to patient's Primary Care Physician Wound #2 Right Toe Third: [facility] for Skilled Nursing - Continue [name], [name] ([id number]) Continue Home Health Visits Home Health Nurse may visit PRN to address patient s wound care needs. FACE TO FACE ENCOUNTER: MEDICARE and MEDICAID PATIENTS: I certify that this patient is under my care and that I had a face-to-face encounter that meets the physician face-to-face encounter requirements with this patient on this date. The encounter with the patient was in whole or in part for the following MEDICAL CONDITION: (primary reason for Home Healthcare) MEDICAL NECESSITY: I certify, that based on my findings, NURSING services are a medically necessary home health service. HOME BOUND STATUS:  I certify that my clinical findings support that this patient is homebound (i.e., Due to illness or injury, pt requires aid of supportive devices such as crutches, cane, wheelchairs, walkers, the use of special transportation or the assistance of another person to leave their place of residence. There is a normal inability to leave the home and doing so requires considerable and  taxing effort. Other absences are for medical reasons / religious services and are infrequent or of short duration when for other reasons). If current dressing causes regression in wound condition, may D/C ordered dressing product/s and apply Normal Saline Moist Dressing daily until next Wound Healing Center / Other MD appointment. Notify Wound Healing Center of regression in wound condition at [phone]. Please direct any NON-WOUND related issues/requests for orders to patient's Primary Care Physician Wound #3 Left Amputation Site - Below Knee: Initiate Home Health for Skilled Nursing - Continue [facility] Continue Home Health Visits Home Health Nurse may visit PRN to address patient s wound care needs. FACE TO FACE ENCOUNTER: MEDICARE and MEDICAID PATIENTS: I certify that this patient is under my care and that I had a face-to-face encounter that meets the physician face-to-face encounter requirements with this patient on this date. The encounter with the patient was in whole or in part for the following MEDICAL CONDITION: (primary reason for Home Healthcare) MEDICAL NECESSITY: I certify, that based on my findings, NURSING services are a medically necessary home health service. HOME BOUND STATUS: I certify that my clinical findings support that this patient is homebound (i.e., Due to illness or injury, pt requires aid of supportive devices such as crutches, cane, wheelchairs, walkers, the use of special transportation or the assistance of another person to leave their place of residence. There is a normal inability to leave the home and doing so requires considerable and taxing effort. Other absences are for medical reasons / religious services and are infrequent or of short duration when for other reasons). If current dressing causes regression in wound condition, may D/C ordered dressing product/s and apply Normal Saline Moist Dressing daily until next Wound Healing Center / Other MD  appointment. Notify Wound Healing Center of regression in wound condition at [phone]. Please direct any NON-WOUND related issues/requests for orders to patient's Primary Care Physician Medications-please add to medication list.: Wound #1 Right Calcaneous: Santyl Enzymatic Ointment Other: - OTC multivitamin with zinc, copper, and selenium Wound #2 Right Toe Third: Santyl Enzymatic Ointment Other: - OTC multivitamin with zinc, copper, and selenium Wound #4 Right,Medial Toe Third: Santyl Enzymatic Ointment Other: - OTC multivitamin with zinc, copper, and selenium Wound #3 Left Amputation Site - Below Knee: Other: - OTC multivitamin with zinc, copper, and selenium Follow-Up Appointments: [name], [name] ([id number]) A follow-up appointment should be scheduled. A Patient Clinical Summary of Care was provided to HS The patient's wounds are slowly improving and after sharp debridement we will convert the left below-knee amputation stump wound dressing to a Idoform gauze packing. Santyl will be used on the right heel and on the right toes. Her son and daughter-in-law here today and all questions have been answered. Electronic Signature(s) Signed: 03/18/2015 5:13:07 PM By: Renee Harder RN Signed: 03/19/2015 5:31:33 PM By: Evlyn Kanner MD, FACS Previous Signature: 03/18/2015 12:24:35 PM Version By: Evlyn Kanner MD, FACS Entered By: Renee Harder on 03/18/2015 17:13:07 [name] ([id number]) -------------------------------------------------------------------------------- SuperBill Details Patient Name: [name] Date of Service: 03/18/2015 Medical Record Number: [id number] Patient Account Number: [id number] Date of Birth/Sex: 1920/12/31 (81 y.o. 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 L97.812 Non-pressure chronic ulcer of other part of right lower leg with fat layer exposed Disruption of external operation (surgical) wound, not elsewhere classified, initial T81.31XA encounter Facility Procedures CPT4: Description Modifier Quantity Code [id number] 11042 - DEB SUBQ TISSUE 20 SQ CM/< 1 ICD-10 Description Diagnosis E11.621 Type 2 diabetes mellitus with foot ulcer I70.235 Atherosclerosis of native arteries of right leg with ulceration of other part of  foot T81.31XA Disruption of external operation (surgical) wound, not elsewhere classified, initial encounter Physician Procedures CPT4: Description Modifier Quantity Code [id number] 11042 - WC PHYS SUBQ TISS 20 SQ CM 1 ICD-10 Description Diagnosis E11.621 Type 2 diabetes mellitus with foot ulcer I70.235 Atherosclerosis of native arteries of right leg with ulceration of other part of  foot T81.31XA Disruption of external operation (surgical) wound, not elsewhere classified, initial encounter NOLAN, LASSER ([id number]) Electronic Signature(s) Signed: 03/18/2015 12:24:35 PM By: Evlyn Kanner MD, FACS Entered By: Evlyn Kanner on 03/18/2015 12:06:52

## 2015-03-25 ENCOUNTER — Encounter: Payer: Medicare Other | Admitting: Surgery

## 2015-03-25 DIAGNOSIS — L97812 Non-pressure chronic ulcer of other part of right lower leg with fat layer exposed: Secondary | ICD-10-CM | POA: Diagnosis not present

## 2015-03-26 NOTE — Progress Notes (Signed)
LACE, CHENEVERT ([id number]) Visit Report for 03/25/2015 Chief Complaint Document Details Patient Name: [name], [name] Date of Service: 03/25/2015 10:15 AM Medical Record Number: [id number] Patient Account Number: [id number] Date of Birth/Sex: [birth year] (81 y.o. Female) Treating RN: Primary Care Physician: Terance Hart, DAVID Other Clinician: Referring Physician: Terance Hart, DAVID Treating Physician/Extender: Rudene Re in Treatment: 6 Information Obtained from: Patient Chief Complaint Patient presents to the wound care center for a consult due non healing wound. 81 year old patient with comes with a history of a ulcerated area to the right third toe and the right heel which she's had for about 2 months. Electronic Signature(s) Signed: 03/25/2015 12:30:49 PM By: Evlyn Kanner MD, FACS Entered By: Evlyn Kanner on 03/25/2015 11:13:45 [name] ([id number]) -------------------------------------------------------------------------------- Debridement Details Patient Name: [name] Date of Service: 03/25/2015 10:15 AM Medical Record Number: [id number] Patient Account Number: [id number] Date of Birth/Sex: [birth year] (81 y.o. Female) Treating RN: Primary Care Physician: Terance Hart, DAVID Other Clinician: Referring Physician: Terance Hart, DAVID Treating Physician/Extender: Rudene Re in Treatment: 6 Debridement Performed for Wound #1 Right Calcaneous Assessment: Performed By: Physician Tristan Schroeder., MD Debridement: Debridement Pre-procedure Yes Verification/Time Out Taken: Start Time: 10:45 Pain Control: Other : lidocaine 4% Level: Skin/Subcutaneous Tissue Total Area Debrided (L x 1.9 (cm) x 2.2 (cm) = 4.18 (cm) W): Tissue and other Viable, Non-Viable, Eschar, Fibrin/Slough, Subcutaneous material debrided: Instrument: Curette Bleeding: None Hemostasis Achieved: Pressure End Time: 10:48 Procedural Pain: 3 Post Procedural Pain: 3 Response to Treatment:  Procedure was tolerated well Post Debridement Measurements of Total Wound Length: (cm) 1.9 Width: (cm) 2.2 Depth: (cm) 0.3 Volume: (cm) 0.985 Electronic Signature(s) Signed: 03/25/2015 12:30:49 PM By: Evlyn Kanner MD, FACS Entered By: Evlyn Kanner on 03/25/2015 11:13:26 [name] ([id number]) -------------------------------------------------------------------------------- HPI Details Patient Name: [name] Date of Service: 03/25/2015 10:15 AM Medical Record Number: [id number] Patient Account Number: [id number] Date of Birth/Sex: 1920/10/31 (81 y.o. Female) Treating RN: Primary Care Physician: Terance Hart, DAVID Other Clinician: Referring Physician: Terance Hart, DAVID Treating Physician/Extender: Rudene Re in Treatment: 6 History of Present Illness Location: right medial heel and right third toe Quality: Patient reports experiencing a dull pain to affected area(s). Severity: Patient states wound are getting worse. Duration: Patient has had the wound for > 3 months prior to seeking treatment at the wound center Timing: Pain in wound is Intermittent (comes and goes Context: The wound appeared gradually over time Modifying Factors: Consults to this date include:vascular surgeon and several recent surgeries. Associated Signs and Symptoms: Patient reports having foul odor and drainage from the left below-knee amputation site. HPI Description: A pleasant 81 year old patient who is known to have diabetes mellitus for several years has recently gone through a series of operations with the vascular surgeon Dr. Gilda Crease. In January and February she had several surgeries on the left lower extremity with an attempt to have limb salvage for gangrenous changes of her forefoot. 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 On 01/07/2015 she also had a right lower extremity distal runoff with a angioplasty  of the right anterior tibial artery to maximize her blood flow to the foot. She recently had her staples removed at Dr. Marijean Heath office on 01/31/2015 and at that time a right lower extremity duplex was done which showed patent vessels and a patent stent with distal occluded posterior tibial artery. Though the duplex was noncritical the recommendations from the PA at the vascular surgery office was that of a angiogram to  be done but the patient said she would rather try some bone care before. The patient has received doxycycline and Cipro in the recent past and she takes oral medications for her diabetes. Other than that I reviewed her list of all her medications. No recent hemoglobin A1c has been done and no recent x-rays of the right foot have been done. 02/18/2015 -- x-ray of the right foot was done on 02/11/2015 and it shows no evidence of acute osteomyelitis of the third toe or of the calcaneus. She had gone to the vascular surgery office and they had noted that there is dehiscence of the left part of the amputation site of the below-knee wound and they have asked [place] to kindly take over the care of this. There've been doing dressings for the right heel and right third toe. 02/25/2015 -- we have received notes from the vascular group who saw her last on 02/13/2015 and she was seen by the PA Ms. Cleda Daub. She had recommended that the patient continue to follow with [place] for wound care including management of the left BKA stump, which has had dehiscence of the lateral part. They will consider repeating a arterial duplex or angiogram if the right lower extremity does not heal within a reasonable period of time. 03/18/2015 -- he saw the vascular surgeon Dr. Gilda Crease and he has set her up for an angioplasty sometime in the middle of July. she is doing well otherwise. [name], [name] ([id number]) Electronic Signature(s) Signed: 03/25/2015 12:30:49 PM By: Evlyn Kanner MD, FACS Entered By:  Evlyn Kanner on 03/25/2015 11:13:52 [name] ([id number]) -------------------------------------------------------------------------------- Physical Exam Details Patient Name: [name] Date of Service: 03/25/2015 10:15 AM Medical Record Number: [id number] Patient Account Number: [id number] Date of Birth/Sex: 1920/10/10 (81 y.o. Female) Treating RN: Primary Care Physician: Terance Hart, DAVID Other Clinician: Referring Physician: Terance Hart, DAVID Treating Physician/Extender: Rudene Re in Treatment: 6 Constitutional . Pulse regular. Respirations normal and unlabored. Afebrile. . Eyes Nonicteric. Reactive to light. Ears, Nose, Mouth, and Throat Lips, teeth, and gums WNL.Marland Kitchen Moist mucosa without lesions . Neck supple and nontender. No palpable supraclavicular or cervical adenopathy. Normal sized without goiter. Respiratory WNL. No retractions.. Cardiovascular Pedal Pulses weak right lower extremity. minimal pedal and lower extremity edema on the right side. Musculoskeletal Adexa without tenderness or enlargement.. Digits and nails w/o clubbing, cyanosis, infection, petechiae, ischemia, or inflammatory conditions.. Integumentary (Hair, Skin) No suspicious lesions. No crepitus or fluctuance. No peri-wound warmth or erythema. No masses.Marland Kitchen Psychiatric Judgement and insight Intact.. No evidence of depression, anxiety, or agitation.. Notes The left amputation site now has a couple of areas which are being packed with iodoform gauze. On the right side she has debris which we will continue using Santyl and after sharp debridement there does look like there is some granulation tissue. Electronic Signature(s) Signed: 03/25/2015 12:30:49 PM By: Evlyn Kanner MD, FACS Entered By: Evlyn Kanner on 03/25/2015 11:15:42 [name] ([id number]) -------------------------------------------------------------------------------- Physician Orders Details Patient Name: [name] Date of Service: 03/25/2015 10:15 AM Medical Record Number: [id number] Patient Account Number: [id number] Date of Birth/Sex: 1920-10-23 (81 y.o. Female) Treating RN: Huel Coventry Primary Care Physician: Dorothey Baseman Other Clinician: Referring Physician: Dorothey Baseman Treating Physician/Extender: Rudene Re in Treatment: 6 Verbal / Phone Orders: No Diagnosis Coding Wound Cleansing Wound #1 Right Calcaneous o Clean wound with Normal Saline. Wound #2 Right Toe Third o Clean wound with Normal Saline. Wound #3 Left Amputation Site - Below Knee o Clean wound with Normal Saline. Wound #4  Right,Medial Toe Third o Clean wound with Normal Saline. Anesthetic Wound #1 Right Calcaneous o Topical Lidocaine 4% cream applied to wound bed prior to debridement Wound #2 Right Toe Third o Topical Lidocaine 4% cream applied to wound bed prior to debridement Wound #3 Left Amputation Site - Below Knee o Topical Lidocaine 4% cream applied to wound bed prior to debridement Wound #4 Right,Medial Toe Third o Topical Lidocaine 4% cream applied to wound bed prior to debridement Primary Wound Dressing Wound #1 Right Calcaneous o Santyl Ointment Wound #2 Right Toe Third o Santyl Ointment Wound #3 Left Amputation Site - Below Knee o Iodoform packing Gauze Wound #4 Right,Medial Toe Third [name], [name] ([id number]) o Santyl Ointment Secondary Dressing Wound #1 Right Calcaneous o Gauze and Kerlix/Conform Wound #2 Right Toe Third o Gauze and Kerlix/Conform Wound #3 Left Amputation Site - Below Knee o Boardered Foam Dressing Wound #4 Right,Medial Toe Third o Gauze and Kerlix/Conform Dressing Change Frequency Wound #1 Right Calcaneous o Change dressing every day. Wound #2 Right Toe Third o Change dressing every day. Wound #3 Left Amputation Site - Below Knee o Change dressing every day. Wound #4 Right,Medial Toe Third o Change dressing every  day. Follow-up Appointments Wound #1 Right Calcaneous o Return Appointment in 1 week. Wound #2 Right Toe Third o Return Appointment in 1 week. Wound #3 Left Amputation Site - Below Knee o Return Appointment in 1 week. Wound #4 Right,Medial Toe Third o Return Appointment in 1 week. Home Health Wound #1 Right Calcaneous o Continue Home Health Visits o Home Health Nurse may visit PRN to address patientos wound care needs. o FACE TO FACE ENCOUNTER: MEDICARE and MEDICAID PATIENTS: I certify that this patient is under my care and that I had a face-to-face encounter that meets the physician face-to-face encounter requirements with this patient on this date. The encounter with the patient was in [place], [place] ([id number]) whole or in part for the following MEDICAL CONDITION: (primary reason for Home Healthcare) MEDICAL NECESSITY: I certify, that based on my findings, NURSING services are a medically necessary home health service. HOME BOUND STATUS: I certify that my clinical findings support that this patient is homebound (i.e., Due to illness or injury, pt requires aid of supportive devices such as crutches, cane, wheelchairs, walkers, the use of special transportation or the assistance of another person to leave their place of residence. There is a normal inability to leave the home and doing so requires considerable and taxing effort. Other absences are for medical reasons / religious services and are infrequent or of short duration when for other reasons). o If current dressing causes regression in wound condition, may D/C ordered dressing product/s and apply Normal Saline Moist Dressing daily until next Wound Healing Center / Other MD appointment. Notify Wound Healing Center of regression in wound condition at [phone]. o Please direct any NON-WOUND related issues/requests for orders to patient's Primary Care Physician Wound #2 Right Toe Third o Continue Home  Health Visits o Home Health Nurse may visit PRN to address patientos wound care needs. o FACE TO FACE ENCOUNTER: MEDICARE and MEDICAID PATIENTS: I certify that this patient is under my care and that I had a face-to-face encounter that meets the physician face-to-face encounter requirements with this patient on this date. The encounter with the patient was in whole or in part for the following MEDICAL CONDITION: (primary reason for Home Healthcare) MEDICAL NECESSITY: I certify, that based on my findings, NURSING services are a medically necessary home  health service. HOME BOUND STATUS: I certify that my clinical findings support that this patient is homebound (i.e., Due to illness or injury, pt requires aid of supportive devices such as crutches, cane, wheelchairs, walkers, the use of special transportation or the assistance of another person to leave their place of residence. There is a normal inability to leave the home and doing so requires considerable and taxing effort. Other absences are for medical reasons / religious services and are infrequent or of short duration when for other reasons). o If current dressing causes regression in wound condition, may D/C ordered dressing product/s and apply Normal Saline Moist Dressing daily until next Wound Healing Center / Other MD appointment. Notify Wound Healing Center of regression in wound condition at [phone]. o Please direct any NON-WOUND related issues/requests for orders to patient's Primary Care Physician Wound #3 Left Amputation Site - Below Knee o Continue Home Health Visits o Home Health Nurse may visit PRN to address patientos wound care needs. o FACE TO FACE ENCOUNTER: MEDICARE and MEDICAID PATIENTS: I certify that this patient is under my care and that I had a face-to-face encounter that meets the physician face-to-face encounter requirements with this patient on this date. The encounter with the patient was  in whole or in part for the following MEDICAL CONDITION: (primary reason for Home Healthcare) MEDICAL NECESSITY: I certify, that based on my findings, NURSING services are a medically necessary home health service. HOME BOUND STATUS: I certify that my clinical findings support that this patient is homebound (i.e., Due to illness or injury, pt requires aid of supportive devices such as crutches, cane, wheelchairs, walkers, the use of special transportation or the assistance of another person to leave their place of residence. There is a normal inability to leave the home and doing so requires considerable and taxing effort. Yocheved Depner, Braniyah ([id number]) absences are for medical reasons / religious services and are infrequent or of short duration when for other reasons). o If current dressing causes regression in wound condition, may D/C ordered dressing product/s and apply Normal Saline Moist Dressing daily until next Wound Healing Center / Other MD appointment. Notify Wound Healing Center of regression in wound condition at [phone]. o Please direct any NON-WOUND related issues/requests for orders to patient's Primary Care Physician Wound #4 Right,Medial Toe Third o Continue Home Health Visits o Home Health Nurse may visit PRN to address patientos wound care needs. o FACE TO FACE ENCOUNTER: MEDICARE and MEDICAID PATIENTS: I certify that this patient is under my care and that I had a face-to-face encounter that meets the physician face-to-face encounter requirements with this patient on this date. The encounter with the patient was in whole or in part for the following MEDICAL CONDITION: (primary reason for Home Healthcare) MEDICAL NECESSITY: I certify, that based on my findings, NURSING services are a medically necessary home health service. HOME BOUND STATUS: I certify that my clinical findings support that this patient is homebound (i.e., Due to illness or injury, pt  requires aid of supportive devices such as crutches, cane, wheelchairs, walkers, the use of special transportation or the assistance of another person to leave their place of residence. There is a normal inability to leave the home and doing so requires considerable and taxing effort. Other absences are for medical reasons / religious services and are infrequent or of short duration when for other reasons). o If current dressing causes regression in wound condition, may D/C ordered dressing product/s and apply Normal Saline Moist  Dressing daily until next Wound Healing Center / Other MD appointment. Notify Wound Healing Center of regression in wound condition at [phone]. o Please direct any NON-WOUND related issues/requests for orders to patient's Primary Care Physician Electronic Signature(s) Signed: 03/25/2015 12:30:49 PM By: Evlyn Kanner MD, FACS Signed: 03/25/2015 4:58:02 PM By: Elliot Gurney RN, BSN, Kim RN, BSN Entered By: Elliot Gurney, RN, BSN, Kim on 03/25/2015 11:05:16 [name] ([id number]) -------------------------------------------------------------------------------- Problem List Details Patient Name: [name] Date of Service: 03/25/2015 10:15 AM Medical Record Number: [id number] Patient Account Number: [id number] Date of Birth/Sex: [birth year] (81 y.o. 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/11/2015 Yes fat layer exposed L97.812 Non-pressure chronic ulcer of  other part of right lower leg 02/11/2015 Yes with fat layer exposed T81.31XA Disruption of external operation (surgical) wound, not 02/18/2015 Yes elsewhere classified, initial encounter Inactive Problems Resolved Problems Electronic Signature(s) Signed: 03/25/2015 12:30:49 PM By: Evlyn Kanner MD, FACS Entered By: Evlyn Kanner on 03/25/2015 11:12:54 [name] ([id number]) -------------------------------------------------------------------------------- Progress Note Details Patient Name: [name] Date of Service: 03/25/2015 10:15 AM Medical Record Number: [id number] Patient Account Number: [id number] Date of Birth/Sex: [birth year] (81 y.o. Female) Treating RN: Primary Care Physician: Terance Hart, DAVID Other Clinician: Referring Physician: Terance Hart, DAVID Treating Physician/Extender: Rudene Re in Treatment: 6 Subjective Chief Complaint Information obtained from Patient Patient presents to the wound care center for a consult due non healing wound. 81 year old patient with comes with a history of a ulcerated area to the right third toe and the right heel which she's had for about 2 months. History of Present Illness (HPI) The following HPI elements were documented for the patient's wound: Location: right medial heel and right third toe Quality: Patient reports experiencing a dull pain to affected area(s). Severity: Patient states wound are getting worse. Duration: Patient has had the wound for > 3 months prior to seeking treatment at the wound center Timing: Pain in wound is Intermittent (comes and goes Context: The wound appeared gradually over time Modifying Factors: Consults to this date include:vascular surgeon and several recent surgeries. Associated Signs and Symptoms: Patient reports having foul odor and drainage from the left below-knee amputation site. A pleasant 81 year old patient who is known to have diabetes mellitus for several years has recently  gone through a series of operations with the vascular surgeon Dr. Gilda Crease. In January and February she had several surgeries on the left lower extremity with an attempt to have limb salvage for gangrenous changes of her forefoot. 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 On 01/07/2015 she also had a right lower extremity distal runoff with a angioplasty of the right anterior tibial artery to maximize her blood flow to the foot. She recently had her staples removed at Dr. Marijean Heath office on 01/31/2015 and at that time a right lower extremity duplex was done which showed patent vessels and a patent stent with distal occluded posterior tibial artery. Though the duplex was noncritical the recommendations from the PA at the vascular surgery office was that of a angiogram to  be done but the patient said she would rather try some bone care before. The patient has received doxycycline and Cipro in the recent past and she takes oral medications for her diabetes. Other than that I reviewed her list of all her medications. No recent hemoglobin A1c has been done and no recent x-rays of the right foot have been done. 02/18/2015 -- x-ray of the right foot was done on 02/11/2015 and it shows no evidence of acute osteomyelitis of the third toe or of the calcaneus. She had gone to the vascular surgery office and they had noted that there is dehiscence of the left part of the amputation site of the below-knee wound and they have asked [place] to kindly take over the care of this. There've been doing dressings for the right heel and right third toe. [name], [name] ([id number]) 02/25/2015 -- we have received notes from the vascular group who saw her last on 02/13/2015 and she was seen by the PA Ms. Cleda Daub. She had recommended that the patient continue to follow with [place] for wound care including management of the left BKA stump,  which has had dehiscence of the lateral part. They will consider repeating a arterial duplex or angiogram if the right lower extremity does not heal within a reasonable period of time. 03/18/2015 -- he saw the vascular surgeon Dr. Gilda Crease and he has set her up for an angioplasty sometime in the middle of July. she is doing well otherwise. Objective Constitutional Pulse regular. Respirations normal and unlabored. Afebrile. Vitals Time Taken: 10:20 AM, Height: 62 in, Weight: 155 lbs, BMI: 28.3, Temperature: 97.9 F, Pulse: 73 bpm, Respiratory Rate: 18 breaths/min, Blood Pressure: 121/72 mmHg. Eyes Nonicteric. Reactive to light. Ears, Nose, Mouth, and Throat Lips, teeth, and gums WNL.Marland Kitchen Moist mucosa without lesions . Neck supple and nontender. No palpable supraclavicular or cervical adenopathy. Normal sized without goiter. Respiratory WNL. No retractions.. Cardiovascular Pedal Pulses weak right lower extremity. minimal pedal and lower extremity edema on the right side. Musculoskeletal Adexa without tenderness or enlargement.. Digits and nails w/o clubbing, cyanosis, infection, petechiae, ischemia, or inflammatory conditions.Marland Kitchen Psychiatric Judgement and insight Intact.. No evidence of depression, anxiety, or agitation.. General Notes: The left amputation site now has a couple of areas which are being packed with iodoform gauze. On the right side she has debris which we will continue using Santyl and after sharp debridement there does look like there is some granulation tissue. [name], [name] ([id number]) Integumentary (Hair, Skin) No suspicious lesions. No crepitus or fluctuance. No peri-wound warmth or erythema. No masses.. Wound #1 status is Open. Original cause of wound was Gradually Appeared. The wound is located on the Right Calcaneous. The wound measures 1.9cm length x 2.2cm width x 0.2cm depth; 3.283cm^2 area and 0.657cm^3 volume. The wound is limited to skin breakdown. There is no  tunneling noted. There is a large amount of purulent drainage noted. The wound margin is flat and intact. There is medium (34-66%) granulation within the wound bed. There is a medium (34-66%) amount of necrotic tissue within the wound bed including Adherent Slough. The periwound skin appearance exhibited: Moist, Erythema. The periwound skin appearance did not exhibit: Callus, Crepitus, Excoriation, Fluctuance, Friable, Induration, Localized Edema, Rash, Scarring, Dry/Scaly, Maceration, Atrophie Blanche, Cyanosis, Ecchymosis, Hemosiderin Staining, Mottled, Pallor, Rubor. The surrounding wound skin color is noted with erythema which is circumferential. Periwound temperature was noted as No Abnormality. The periwound has tenderness on palpation. Wound #2 status is Open. Original cause of wound was Gradually  Appeared. The wound is located on the Right Toe Third. The wound measures 0.7cm length x 0.6cm width x 0.1cm depth; 0.33cm^2 area and 0.033cm^3 volume. The wound is limited to skin breakdown. There is a small amount of purulent drainage noted. The wound margin is flat and intact. There is no granulation within the wound bed. There is a large (67-100%) amount of necrotic tissue within the wound bed including Adherent Slough. The periwound skin appearance exhibited: Moist, Erythema. The periwound skin appearance did not exhibit: Callus, Crepitus, Excoriation, Fluctuance, Friable, Induration, Localized Edema, Rash, Scarring, Dry/Scaly, Maceration, Atrophie Blanche, Cyanosis, Ecchymosis, Hemosiderin Staining, Mottled, Pallor, Rubor. The surrounding wound skin color is noted with erythema which is circumferential. Periwound temperature was noted as No Abnormality. The periwound has tenderness on palpation. Wound #3 status is Open. Original cause of wound was Surgical Injury. The wound is located on the Left Amputation Site - Below Knee. The wound measures 2cm length x 6.5cm width x 0.6cm depth;  10.21cm^2 area and 6.126cm^3 volume. The wound is limited to skin breakdown. There is a large amount of serous drainage noted. The wound margin is distinct with the outline attached to the wound base. There is small (1-33%) granulation within the wound bed. There is a large (67-100%) amount of necrotic tissue within the wound bed including Adherent Slough. The periwound skin appearance exhibited: Maceration, Moist. The periwound skin appearance did not exhibit: Callus, Crepitus, Excoriation, Fluctuance, Friable, Induration, Localized Edema, Rash, Scarring, Dry/Scaly, Atrophie Blanche, Cyanosis, Ecchymosis, Hemosiderin Staining, Mottled, Pallor, Rubor, Erythema. Periwound temperature was noted as No Abnormality. The periwound has tenderness on palpation. Wound #4 status is Open. Original cause of wound was Gradually Appeared. The wound is located on the Right,Medial Toe Third. The wound measures 0.4cm length x 0.2cm width x 0.1cm depth; 0.063cm^2 area and 0.006cm^3 volume. The wound is limited to skin breakdown. There is a small amount of serous drainage noted. The wound margin is distinct with the outline attached to the wound base. There is no granulation within the wound bed. There is a large (67-100%) amount of necrotic tissue within the wound bed including Adherent Slough. The periwound skin appearance did not exhibit: Callus, Crepitus, Excoriation, Fluctuance, Friable, Induration, Localized Edema, Rash, Scarring, Dry/Scaly, Maceration, Moist, Atrophie Blanche, Cyanosis, Ecchymosis, Hemosiderin Staining, Mottled, Pallor, Rubor, Erythema. Periwound temperature was noted as No Abnormality. The periwound has tenderness on palpation. [name], [name] ([id number]) The left amputation site now has a couple of areas which are being packed with iodoform gauze. On the right side she has debris which we will continue using Santyl and after sharp debridement there does look like there is some granulation  tissue. 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 We will see her back next week. 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 There was a Skin/Subcutaneous Tissue Debridement ([id number]([id number]) debridement with total area of 4.18 sq cm performed by Birl Lobello, Ignacia FellingErrol J., MD. with  the following instrument(s): Curette to remove Viable and Non-Viable tissue/material including Fibrin/Slough, Eschar, and Subcutaneous after achieving pain control using Other (lidocaine 4%). A time out was conducted prior to the start of the procedure. There was no bleeding. The procedure was tolerated well with a pain level of 3 throughout and a pain level of 3 following the procedure. Post Debridement Measurements: 1.9cm length x 2.2cm width x 0.3cm depth; 0.985cm^3 volume. Plan Wound Cleansing: Wound #1 Right Calcaneous: Clean wound with Normal Saline. Wound #2 Right Toe Third: Clean wound with Normal Saline. Wound #3 Left Amputation Site - Below Knee: [name], [name] ([id number]) Clean wound with Normal Saline. Wound #4 Right,Medial Toe Third: Clean wound with Normal Saline. Anesthetic: Wound #1 Right Calcaneous: Topical Lidocaine 4% cream applied to wound bed prior to debridement Wound #2 Right Toe Third: Topical Lidocaine 4% cream applied to wound bed prior to debridement Wound #3  Left Amputation Site - Below Knee: Topical Lidocaine 4% cream applied to wound bed prior to debridement Wound #4 Right,Medial Toe Third: Topical Lidocaine 4% cream applied to wound bed prior to debridement Primary Wound Dressing: Wound #1 Right Calcaneous: Santyl Ointment Wound #2 Right Toe Third: Santyl Ointment Wound #3 Left Amputation Site - Below Knee: Iodoform packing Gauze Wound #4 Right,Medial Toe Third: Santyl Ointment Secondary Dressing: Wound #1 Right Calcaneous: Gauze and Kerlix/Conform Wound #2 Right Toe Third: Gauze and Kerlix/Conform Wound #3 Left Amputation Site - Below Knee: Boardered Foam Dressing Wound #4 Right,Medial Toe Third: Gauze and Kerlix/Conform Dressing Change Frequency: Wound #1 Right Calcaneous: Change dressing every day. Wound #2 Right Toe Third: Change dressing every day. Wound #3 Left Amputation Site - Below Knee: Change dressing every day. Wound #4 Right,Medial Toe Third: Change dressing every day. Follow-up Appointments: Wound #1 Right Calcaneous: Return Appointment in 1 week. Wound #2 Right Toe Third: Return Appointment in 1 week. 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 Wound #4 Right,Medial Toe Third: Return Appointment in 1 week. Home Health: Wound #1 Right Calcaneous: Continue Home Health Visits [name], [name] ([id number]) Home Health Nurse may visit PRN to address patient s wound care needs. FACE TO FACE ENCOUNTER: MEDICARE and MEDICAID PATIENTS: I certify that this patient is under my care and that I had a face-to-face encounter that meets the physician face-to-face encounter requirements with this patient on this date. The encounter with the patient was in whole or in part for the following MEDICAL CONDITION: (primary reason for Home Healthcare) MEDICAL NECESSITY: I certify, that based on my findings, NURSING services are a medically necessary home health service. HOME BOUND STATUS: I certify  that my clinical findings support that this patient is homebound (i.e., Due to illness or injury, pt requires aid of supportive devices such as crutches, cane, wheelchairs, walkers, the use of special transportation or the assistance of another person to leave their place of residence. There is a normal inability to leave the home and doing so requires considerable and taxing effort. Other absences are for medical reasons / religious services and are infrequent or of short duration when for other reasons). If current dressing causes regression in wound condition, may D/C ordered dressing product/s and apply Normal Saline Moist Dressing daily until next Wound Healing Center / Other MD appointment. Notify Wound Healing Center of regression in wound condition at [phone]. Please direct any NON-WOUND related issues/requests for orders to patient's Primary Care Physician Wound #2 Right Toe Third: Continue Home Health Visits Home Health Nurse may  visit PRN to address patient s wound care needs. FACE TO FACE ENCOUNTER: MEDICARE and MEDICAID PATIENTS: I certify that this patient is under my care and that I had a face-to-face encounter that meets the physician face-to-face encounter requirements with this patient on this date. The encounter with the patient was in whole or in part for the following MEDICAL CONDITION: (primary reason for Home Healthcare) MEDICAL NECESSITY: I certify, that based on my findings, NURSING services are a medically necessary home health service. HOME BOUND STATUS: I certify that my clinical findings support that this patient is homebound (i.e., Due to illness or injury, pt requires aid of supportive devices such as crutches, cane, wheelchairs, walkers, the use of special transportation or the assistance of another person to leave their place of residence. There is a normal inability to leave the home and doing so requires considerable and taxing effort. Other absences  are for medical reasons / religious services and are infrequent or of short duration when for other reasons). If current dressing causes regression in wound condition, may D/C ordered dressing product/s and apply Normal Saline Moist Dressing daily until next Wound Healing Center / Other MD appointment. Notify Wound Healing Center of regression in wound condition at [phone]. Please direct any NON-WOUND related issues/requests for orders to patient's Primary Care Physician Wound #3 Left Amputation Site - Below Knee: Continue Home Health Visits Home Health Nurse may visit PRN to address patient s wound care needs. FACE TO FACE ENCOUNTER: MEDICARE and MEDICAID PATIENTS: I certify that this patient is under my care and that I had a face-to-face encounter that meets the physician face-to-face encounter requirements with this patient on this date. The encounter with the patient was in whole or in part for the following MEDICAL CONDITION: (primary reason for Home Healthcare) MEDICAL NECESSITY: I certify, that based on my findings, NURSING services are a medically necessary home health service. HOME BOUND STATUS: I certify that my clinical findings support that this patient is homebound (i.e., Due to illness or injury, pt requires aid of supportive devices such as crutches, cane, wheelchairs, walkers, the use of special transportation or the assistance of another person to leave their place of residence. There is a normal inability to leave the home and doing so requires considerable and taxing effort. Other absences are for medical reasons / religious services and are infrequent or of short duration when for other reasons). If current dressing causes regression in wound condition, may D/C ordered dressing product/s and apply Normal Saline Moist Dressing daily until next Wound Healing Center / Other MD appointment. Notify Wound Healing Center of regression in wound condition at [phone]. Please  direct any NON-WOUND related issues/requests for orders to patient's Primary Care Physician Wound #4 Right,Medial Toe Third: Continue Home Health Visits [name], [name] ([id number]) Home Health Nurse may visit PRN to address patient s wound care needs. FACE TO FACE ENCOUNTER: MEDICARE and MEDICAID PATIENTS: I certify that this patient is under my care and that I had a face-to-face encounter that meets the physician face-to-face encounter requirements with this patient on this date. The encounter with the patient was in whole or in part for the following MEDICAL CONDITION: (primary reason for Home Healthcare) MEDICAL NECESSITY: I certify, that based on my findings, NURSING services are a medically necessary home health service. HOME BOUND STATUS: I certify that my clinical findings support that this patient is homebound (i.e., Due to illness or injury, pt requires aid of supportive devices such as crutches, cane,  wheelchairs, walkers, the use of special transportation or the assistance of another person to leave their place of residence. There is a normal inability to leave the home and doing so requires considerable and taxing effort. Other absences are for medical reasons / religious services and are infrequent or of short duration when for other reasons). If current dressing causes regression in wound condition, may D/C ordered dressing product/s and apply Normal Saline Moist Dressing daily until next Wound Healing Center / Other MD appointment. Notify Wound Healing Center of regression in wound condition at [phone]. Please direct any NON-WOUND related issues/requests for orders to patient's Primary Care Physician we will continue using iodoform gauze to the left amputation stump and Santyl to the right lower extremity. We will see her back next week. Electronic Signature(s) Signed: 03/25/2015 12:30:49 PM By: Evlyn Kanner MD, FACS Entered By: Evlyn Kanner on 03/25/2015  11:16:35 [name] ([id number]) -------------------------------------------------------------------------------- SuperBill Details Patient Name: [name] Date of Service: 03/25/2015 Medical Record Number: [id number] Patient Account Number: [id number] Date of Birth/Sex: 09-07-1921 (81 y.o. 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 L97.812 Non-pressure chronic ulcer of other part of right lower leg with fat layer exposed Disruption of external operation (surgical) wound, not elsewhere classified, initial T81.31XA encounter Facility Procedures CPT4: Description Modifier Quantity Code [id number] 11042 - DEB SUBQ TISSUE 20 SQ CM/< 1 ICD-10 Description Diagnosis E11.621 Type 2 diabetes mellitus with foot ulcer I70.235 Atherosclerosis of native arteries of right leg with ulceration of other part of  foot 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 L97.812 Non-pressure chronic ulcer of other part of right lower leg with fat layer exposed Physician Procedures CPT4: Description Modifier Quantity Code [id number] 11042 - WC PHYS SUBQ TISS 20 SQ CM 1 ICD-10 Description Diagnosis E11.621 Type 2 diabetes mellitus with foot ulcer I70.235 Atherosclerosis of native arteries of right leg with ulceration of other part of  foot 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

## 2015-03-26 NOTE — Progress Notes (Signed)
[name] ([id number]) Visit Report for 03/25/2015 Arrival Information Details Patient Name: [name], [name] Date of Service: 03/25/2015 10:15 AM Medical Record Number: [id number] Patient Account Number: [id number] Date of Birth/Sex: 08-Jul-1921 (81 y.o. Female) Treating RN: [name] Primary Care Physician: Dorothey Baseman Other Clinician: Referring Physician: Dorothey Baseman Treating Physician/Extender: Rudene Re in Treatment: 6 Visit Information History Since Last Visit Added or deleted any medications: No Patient Arrived: Wheel Chair Any new allergies or adverse reactions: No Arrival Time: 10:17 Had a fall or experienced change in No activities of daily living that may affect Accompanied By: daughter risk of falls: Transfer Assistance: Manual Signs or symptoms of abuse/neglect since last No Patient Identification Verified: Yes visito Secondary Verification Process Yes Hospitalized since last visit: No Completed: Has Dressing in Place as Prescribed: Yes Patient Has Alerts: Yes Pain Present Now: No Patient Alerts: DMII Electronic Signature(s) Signed: 03/25/2015 4:58:02 PM By: [name] Gurney, RN, BSN, Kim RN, BSN Entered By: [name] Gurney, RN, BSN, Kim on 03/25/2015 10:18:23 [name] ([id number]) -------------------------------------------------------------------------------- Encounter Discharge Information Details Patient Name: [name] Date of Service: 03/25/2015 10:15 AM Medical Record Number: [id number] Patient Account Number: [id number] Date of Birth/Sex: 11/30/1920 (81 y.o. Female) Treating RN: [name] Primary Care Physician: Dorothey Baseman Other Clinician: Referring Physician: Dorothey Baseman Treating Physician/Extender: Rudene Re in Treatment: 6 Encounter Discharge Information Items Discharge Pain Level: 1 Discharge Condition: Stable Ambulatory Status: Wheelchair Discharge Destination: Home Transportation: Private Auto son  and Accompanied By: daughter in law Schedule Follow-up Appointment: Yes Medication Reconciliation completed and provided to Patient/Care Yes [name]: Clinical Summary of Care: Electronic Signature(s) Signed: 03/25/2015 4:58:02 PM By: [name] Gurney, RN, BSN, Kim RN, BSN Entered By: [name] Gurney, RN, BSN, Kim on 03/25/2015 11:07:43 [name] ([id number]) -------------------------------------------------------------------------------- Lower Extremity Assessment Details Patient Name: [name] Date of Service: 03/25/2015 10:15 AM Medical Record Number: [id number] Patient Account Number: [id number] Date of Birth/Sex: 1921-01-29 (81 y.o. Female) Treating RN: [name] Primary Care Physician: Dorothey Baseman Other Clinician: Referring Physician: Dorothey Baseman Treating Physician/Extender: Rudene Re in Treatment: 6 Vascular Assessment Pulses: Posterior Tibial Dorsalis Pedis Palpable: [Right:No] Doppler: [Right:Monophasic] Extremity colors, hair growth, and conditions: Extremity Color: [Right:Pale] Hair Growth on Extremity: [Right:Yes] Temperature of Extremity: [Right:Warm] Capillary Refill: [Right:< 3 seconds] Toe Nail Assessment Left: Right: Thick: Yes Discolored: Yes Deformed: No Improper Length and Hygiene: No Electronic Signature(s) Signed: 03/25/2015 4:58:02 PM By: [name] Gurney, RN, BSN, Kim RN, BSN Entered By: [name] Gurney, RN, BSN, Kim on 03/25/2015 10:30:15 [name] ([id number]) -------------------------------------------------------------------------------- Multi Wound Chart Details Patient Name: [name] Date of Service: 03/25/2015 10:15 AM Medical Record Number: [id number] Patient Account Number: [id number] Date of Birth/Sex: December 28, 1920 (81 y.o. 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] Comorbid History: [1:Arrhythmia, Deep Vein Thrombosis, Peripheral Arterial Disease, Type II Arterial Disease, Type II Arterial Disease, Type II Diabetes] [2:Arrhythmia, Deep Vein Thrombosis, Peripheral Diabetes] [3:Arrhythmia, Deep Vein Thrombosis,  Peripheral Diabetes] Date Acquired: [1:11/04/2014] [2:11/04/2014] [3:01/03/2015] Weeks of Treatment: [1:6] [2:6] [3:5] Wound Status: [1:Open] [2:Open] [3:Open] Measurements L x W x D 1.9x2.2x0.2 [2:0.7x0.6x0.1] [3:2x6.5x0.6] (cm) Area (cm) : [  1:3.283] [2:0.33] [3:10.21] Volume (cm) : [1:0.657] [2:0.033] [3:6.126] % Reduction in Area: [1:40.30%] [2:65.00%] [3:1.50%] % Reduction in Volume: -19.50% [2:64.90%] [3:-47.70%] Classification: [1:Full Thickness Without Exposed Support Structures] [2:Full Thickness Without Exposed Support Structures] [3:Full Thickness Without Exposed Support Structures] HBO Classification: [1:Grade 1] [2:Grade 1] [3:Grade 2] Exudate Amount: [1:Large] [2:Small] [3:Large] Exudate Type: [1:Purulent] [2:Purulent] [3:Serous] Exudate Color: [1:yellow, brown, green] [2:yellow, brown, green] [3:amber] Wound Margin: [1:Flat and Intact] [2:Flat and Intact] [3:Distinct, outline attached] Granulation Amount: [1:Medium (34-66%)] [2:None Present (0%)] [3:Small (1-33%)] Necrotic Amount: [1:Medium (34-66%)] [2:Large (67-100%)] [3:Large (67-100%)] Exposed Structures: [1:Fascia: No Fat: No] [2:Fascia: No Fat: No] [3:Fascia: No Fat: No] Tendon:  No Tendon: No Tendon: No Muscle: No Muscle: No Muscle: No Joint: No Joint: No Joint: No Bone: No Bone: No Bone: No Limited to Skin Limited to Skin Limited to Skin Breakdown Breakdown Breakdown Epithelialization: None None None Periwound Skin Texture: Edema: No Edema: No Edema: No Excoriation: No Excoriation: No Excoriation: No Induration: No Induration: No Induration: No Callus: No Callus: No Callus: No Crepitus: No Crepitus: No Crepitus: No Fluctuance: No Fluctuance: No Fluctuance: No Friable: No Friable: No Friable: No Rash: No Rash: No Rash: No Scarring: No Scarring: No Scarring: No Periwound Skin Moist: Yes Moist: Yes Maceration: Yes Moisture: Maceration: No Maceration: No Moist: Yes Dry/Scaly: No Dry/Scaly: No Dry/Scaly: No Periwound Skin Color: Erythema: Yes Erythema: Yes Atrophie Blanche: No Atrophie Blanche: No Atrophie Blanche: No Cyanosis: No Cyanosis: No Cyanosis: No Ecchymosis: No Ecchymosis: No Ecchymosis: No Erythema: No Hemosiderin Staining: No Hemosiderin Staining: No Hemosiderin Staining: No Mottled: No Mottled: No Mottled: No Pallor: No Pallor: No Pallor: No Rubor: No Rubor: No Rubor: No Erythema Location: Circumferential Circumferential N/A Temperature: No Abnormality No Abnormality No Abnormality Tenderness on Yes Yes Yes Palpation: Wound Preparation: Ulcer Cleansing: Ulcer Cleansing: Ulcer Cleansing: Rinsed/Irrigated with Rinsed/Irrigated with Rinsed/Irrigated with Saline Saline Saline Topical Anesthetic Topical Anesthetic Topical Anesthetic Applied: Other: lidocaine Applied: Other: lidocaine Applied: Other: lidocaine 4% 4% 4% Wound Number: 4 N/A N/A Photos: No Photos N/A N/A Wound Location: Right Toe Third - Medial N/A N/A Wounding Event: Gradually Appeared N/A N/A Primary Etiology: Diabetic Wound/Ulcer of N/A N/A the Lower Extremity Comorbid History: Arrhythmia, Deep Vein N/A N/A Thrombosis,  Peripheral Arterial Disease, Type II Diabetes Date Acquired: 02/25/2015 N/A N/A ALEXANDRA, LIPPS ([id number]) Weeks of Treatment: 2 N/A N/A Wound Status: Open N/A N/A Measurements L x W x D 0.4x0.2x0.1 N/A N/A (cm) Area (cm) : 0.063 N/A N/A Volume (cm) : 0.006 N/A N/A % Reduction in Area: 50.00% N/A N/A % Reduction in Volume: 76.00% N/A N/A Classification: Grade 2 N/A N/A HBO Classification: N/A N/A N/A Exudate Amount: Small N/A N/A Exudate Type: Serous N/A N/A Exudate Color: amber N/A N/A Wound Margin: Distinct, outline attached N/A N/A Granulation Amount: None Present (0%) N/A N/A Necrotic Amount: Large (67-100%) N/A N/A Exposed Structures: Fascia: No N/A N/A Fat: No Tendon: No Muscle: No Joint: No Bone: No Limited to Skin Breakdown Epithelialization: None N/A N/A Periwound Skin Texture: Edema: No N/A N/A Excoriation: No Induration: No Callus: No Crepitus: No Fluctuance: No Friable: No Rash: No Scarring: No Periwound Skin Maceration: No N/A N/A Moisture: Moist: No Dry/Scaly: No Periwound Skin Color: Atrophie Blanche: No N/A N/A Cyanosis: No Ecchymosis: No Erythema: No Hemosiderin Staining: No Mottled: No Pallor: No Rubor: No Erythema Location: N/A N/A N/A Temperature: No Abnormality N/A N/A Tenderness on Yes N/A N/A Palpation: Wound Preparation: N/A N/A Netherland, Sorrel ([id number]) Ulcer Cleansing: Rinsed/Irrigated with Saline Topical  Anesthetic Applied: Other: liodocaine 4% Treatment Notes Electronic Signature(s) Signed: 03/25/2015 4:58:02 PM By: [name] Gurney, RN, BSN, Kim RN, BSN Entered By: [name] Gurney, RN, BSN, Kim on 03/25/2015 10:47:17 [name] ([id number]) -------------------------------------------------------------------------------- Multi-Disciplinary Care Plan Details Patient Name: [name] Date of Service: 03/25/2015 10:15 AM Medical Record Number: [id number] Patient Account Number: [id number] Date of Birth/Sex: September 09, 1921 (81 y.o.  Female) Treating RN: [name] Primary Care Physician: Dorothey Baseman Other Clinician: Referring Physician: Dorothey Baseman Treating Physician/Extender: Rudene Re in Treatment: 6 Active Inactive Abuse / Safety / Falls / Self Care Management Nursing Diagnoses: Impaired physical mobility Potential for falls Goals: Patient will remain injury free Date Initiated: 02/11/2015 Goal Status: Active Patient/caregiver will verbalize understanding of skin care regimen Date Initiated: 02/11/2015 Goal Status: Active Patient/caregiver will verbalize/demonstrate measures taken to prevent injury and/or falls Date Initiated: 02/11/2015 Goal Status: Active Patient/caregiver will verbalize/demonstrate understanding of what to do in case of emergency Date Initiated: 02/11/2015 Goal Status: Active Interventions: Assess fall risk on admission and as needed Provide education on fall prevention Provide education on safe transfers Treatment Activities: Patient referred to home care : 03/25/2015 Notes: Nutrition Nursing Diagnoses: Imbalanced nutrition Potential for alteratiion in Nutrition/Potential for imbalanced nutrition Ayer, Daniyah ([id number]) Goals: Patient/caregiver verbalizes understanding of need to maintain therapeutic glucose control per primary care physician Date Initiated: 02/11/2015 Goal Status: Active Patient/caregiver will maintain therapeutic glucose control Date Initiated: 02/11/2015 Goal Status: Active Interventions: Assess HgA1c results as ordered upon admission and as needed Provide education on elevated blood sugars and impact on wound healing Provide education on nutrition Treatment Activities: Obtain HgA1c : 03/25/2015 Notes: Wound/Skin Impairment Nursing Diagnoses: Impaired tissue integrity Knowledge deficit related to ulceration/compromised skin integrity Goals: Patient/caregiver will verbalize understanding of skin care regimen Date Initiated:  02/11/2015 Goal Status: Active Ulcer/skin breakdown will heal within 14 weeks Date Initiated: 02/11/2015 Goal Status: Active Interventions: Assess patient/caregiver ability to obtain necessary supplies Assess patient/caregiver ability to perform ulcer/skin care regimen upon admission and as needed Assess ulceration(s) every visit Provide education on ulcer and skin care Treatment Activities: Skin care regimen initiated : 03/25/2015 Topical wound management initiated : 03/25/2015 Notes: REGIS, WILAND ([id number]) Electronic Signature(s) Signed: 03/25/2015 4:58:02 PM By: [name] Gurney, RN, BSN, Kim RN, BSN Entered By: [name] Gurney, RN, BSN, Kim on 03/25/2015 10:46:44 [name] ([id number]) -------------------------------------------------------------------------------- Pain Assessment Details Patient Name: [name] Date of Service: 03/25/2015 10:15 AM Medical Record Number: [id number] Patient Account Number: [id number] Date of Birth/Sex: 1921/01/06 (81 y.o. Female) Treating RN: [name] Primary Care Physician: Dorothey Baseman Other Clinician: Referring Physician: Dorothey Baseman Treating Physician/Extender: Rudene Re in Treatment: 6 Active Problems Location of Pain Severity and Description of Pain Patient Has Paino No Site Locations Pain Management and Medication Current Pain Management: Electronic Signature(s) Signed: 03/25/2015 4:58:02 PM By: [name] Gurney, RN, BSN, Kim RN, BSN Entered By: [name] Gurney, RN, BSN, Kim on 03/25/2015 10:18:33 [name] ([id number]) -------------------------------------------------------------------------------- Patient/Caregiver Education Details Patient Name: [name] Date of Service: 03/25/2015 10:15 AM Medical Record Number: [id number] Patient Account Number: [id number] Date of Birth/Gender: 08-04-1921 (81 y.o. Female) Treating RN: [name] Primary Care Physician: Dorothey Baseman Other Clinician: Referring Physician: Dorothey Baseman Treating Physician/Extender: Rudene Re in Treatment: 6 Education Assessment Education Provided To: Patient Education Topics Provided Wound/Skin Impairment: Handouts: Caring for Your Ulcer Methods: Demonstration Responses: State content correctly Electronic Signature(s) Signed: 03/25/2015 4:58:02 PM By: [name] Gurney, RN, BSN, Kim RN, BSN Entered By: [name] Gurney, RN, BSN, Kim on 03/25/2015 11:08:02 [name] ([id number]) -------------------------------------------------------------------------------- Wound Assessment Details Patient Name:  [name] NationsSILETZKY, Alishba Date of Service: 03/25/2015 10:15 AM Medical Record Number: [id number] Patient Account Number: [id number] Date of Birth/Sex: 01/04/1921 (81 y.o. Female) Treating RN: [name] CoventryWoody, Kim Primary Care Physician: Dorothey BasemanBRONSTEIN, DAVID Other Clinician: Referring Physician: Dorothey BasemanBRONSTEIN, DAVID Treating Physician/Extender: Rudene ReBritto, Errol Weeks in Treatment: 6 Wound Status Wound Number: 1 Primary Arterial Insufficiency Ulcer Etiology: Wound Location: Right Calcaneous Wound Open Wounding Event: Gradually Appeared Status: Date Acquired: 11/04/2014 Comorbid Arrhythmia, Deep Vein Thrombosis, Weeks Of Treatment: 6 History: Peripheral Arterial Disease, Type II Clustered Wound: No Diabetes Wound Measurements Length: (cm) 1.9 % Reduction i Width: (cm) 2.2 % Reduction i Depth: (cm) 0.2 Epithelializa Area: (cm) 3.283 Tunneling: Volume: (cm) 0.657 n Area: 40.3% n Volume: -19.5% tion: None No Wound Description Full Thickness Without Foul Odor Aft Classification: Exposed Support Structures Diabetic Severity Grade 1 (Wagner): Wound Margin: Flat and Intact Exudate Amount: Large Exudate Type: Purulent Exudate Color: yellow, brown, green er Cleansing: No Wound Bed Granulation Amount: Medium (34-66%) Exposed Structure Necrotic Amount: Medium (34-66%) Fascia Exposed: No Necrotic Quality: Adherent Slough Fat Layer Exposed: No Tendon  Exposed: No Muscle Exposed: No Joint Exposed: No Bone Exposed: No Limited to Skin Breakdown Periwound Skin Texture Texture Color No Abnormalities Noted: No No Abnormalities Noted: No Prisk, Alyshia ([id number]) Callus: No Atrophie Blanche: No Crepitus: No Cyanosis: No Excoriation: No Ecchymosis: No Fluctuance: No Erythema: Yes Friable: No Erythema Location: Circumferential Induration: No Hemosiderin Staining: No Localized Edema: No Mottled: No Rash: No Pallor: No Scarring: No Rubor: No Moisture Temperature / Pain No Abnormalities Noted: No Temperature: No Abnormality Dry / Scaly: No Tenderness on Palpation: Yes Maceration: No Moist: Yes Wound Preparation Ulcer Cleansing: Rinsed/Irrigated with Saline Topical Anesthetic Applied: Other: lidocaine 4%, Treatment Notes Wound #1 (Right Calcaneous) 1. Cleansed with: Clean wound with Normal Saline 2. Anesthetic Topical Lidocaine 4% cream to wound bed prior to debridement 4. Dressing Applied: Santyl Ointment 5. Secondary Dressing Applied Gauze and Kerlix/Conform Electronic Signature(s) Signed: 03/25/2015 4:58:02 PM By: [name] GurneyWoody, RN, BSN, Kim RN, BSN Entered By: [name] GurneyWoody, RN, BSN, Kim on 03/25/2015 10:38:18 [name] NationsSILETZKY, Gwendlyn ([id number]) -------------------------------------------------------------------------------- Wound Assessment Details Patient Name: [name] NationsSILETZKY, Yamilka Date of Service: 03/25/2015 10:15 AM Medical Record Number: [id number] Patient Account Number: [id number] Date of Birth/Sex: 03/15/1921 (81 y.o. Female) Treating RN: [name] CoventryWoody, Kim Primary Care Physician: Dorothey BasemanBRONSTEIN, DAVID Other Clinician: Referring Physician: Dorothey BasemanBRONSTEIN, DAVID Treating Physician/Extender: Rudene ReBritto, Errol Weeks in Treatment: 6 Wound Status Wound Number: 2 Primary Arterial Insufficiency Ulcer Etiology: Wound Location: Right Toe Third Wound Open Wounding Event: Gradually Appeared Status: Date Acquired: 11/04/2014 Comorbid Arrhythmia, Deep Vein  Thrombosis, Weeks Of Treatment: 6 History: Peripheral Arterial Disease, Type II Clustered Wound: No Diabetes Wound Measurements Length: (cm) 0.7 Width: (cm) 0.6 Depth: (cm) 0.1 Area: (cm) 0.33 Volume: (cm) 0.033 % Reduction in Area: 65% % Reduction in Volume: 64.9% Epithelialization: None Wound Description Full Thickness Without Classification: Exposed Support Structures Diabetic Severity Grade 1 (Wagner): Wound Margin: Flat and Intact Exudate Amount: Small Exudate Type: Purulent Exudate Color: yellow, brown, green Foul Odor After Cleansing: No Wound Bed Granulation Amount: None Present (0%) Exposed Structure Necrotic Amount: Large (67-100%) Fascia Exposed: No Necrotic Quality: Adherent Slough Fat Layer Exposed: No Tendon Exposed: No Muscle Exposed: No Joint Exposed: No Bone Exposed: No Limited to Skin Breakdown Periwound Skin Texture Texture Color No Abnormalities Noted: No No Abnormalities Noted: No Curci, Marvie ([id number]) Callus: No Atrophie Blanche: No Crepitus: No Cyanosis: No Excoriation: No Ecchymosis: No Fluctuance: No Erythema: Yes Friable: No Erythema Location: Circumferential Induration: No Hemosiderin Staining: No Localized Edema:  No Mottled: No Rash: No Pallor: No Scarring: No Rubor: No Moisture Temperature / Pain No Abnormalities Noted: No Temperature: No Abnormality Dry / Scaly: No Tenderness on Palpation: Yes Maceration: No Moist: Yes Wound Preparation Ulcer Cleansing: Rinsed/Irrigated with Saline Topical Anesthetic Applied: Other: lidocaine 4%, Treatment Notes Wound #2 (Right Toe Third) 1. Cleansed with: Clean wound with Normal Saline 2. Anesthetic Topical Lidocaine 4% cream to wound bed prior to debridement 4. Dressing Applied: Santyl Ointment 5. Secondary Dressing Applied Gauze and Kerlix/Conform Electronic Signature(s) Signed: 03/25/2015 4:58:02 PM By: [name] Gurney, RN, BSN, Kim RN, BSN Entered By: [name] Gurney, RN, BSN,  Kim on 03/25/2015 10:38:34 [name] ([id number]) -------------------------------------------------------------------------------- Wound Assessment Details Patient Name: [name] Date of Service: 03/25/2015 10:15 AM Medical Record Number: [id number] Patient Account Number: [id number] Date of Birth/Sex: 09-20-1921 (81 y.o. Female) Treating RN: [name] Primary Care Physician: Dorothey Baseman Other Clinician: Referring Physician: Dorothey Baseman Treating Physician/Extender: Rudene Re in Treatment: 6 Wound Status Wound Number: 3 Primary Dehisced Wound Etiology: Wound Location: Left Amputation Site - Below Knee Wound Open Status: Wounding Event: Surgical Injury Comorbid Arrhythmia, Deep Vein Thrombosis, Date Acquired: 01/03/2015 History: Peripheral Arterial Disease, Type II Weeks Of Treatment: 5 Diabetes Clustered Wound: No Wound Measurements Length: (cm) 2 Width: (cm) 6.5 Depth: (cm) 0.6 Area: (cm) 10.21 Volume: (cm) 6.126 % Reduction in Area: 1.5% % Reduction in Volume: -47.7% Epithelialization: None Wound Description Full Thickness Without Classification: Exposed Support Structures Diabetic Severity Grade 2 (Wagner): Wound Margin: Distinct, outline attached Exudate Amount: Large Exudate Type: Serous Exudate Color: amber Foul Odor After Cleansing: No Wound Bed Granulation Amount: Small (1-33%) Exposed Structure Necrotic Amount: Large (67-100%) Fascia Exposed: No Necrotic Quality: Adherent Slough Fat Layer Exposed: No Tendon Exposed: No Muscle Exposed: No Joint Exposed: No Bone Exposed: No Limited to Skin Breakdown Periwound Skin Texture Texture Color No Abnormalities Noted: No No Abnormalities Noted: No Hartung, Brittnae ([id number]) Callus: No Atrophie Blanche: No Crepitus: No Cyanosis: No Excoriation: No Ecchymosis: No Fluctuance: No Erythema: No Friable: No Hemosiderin Staining: No Induration: No Mottled: No Localized  Edema: No Pallor: No Rash: No Rubor: No Scarring: No Temperature / Pain Moisture Temperature: No Abnormality No Abnormalities Noted: No Tenderness on Palpation: Yes Dry / Scaly: No Maceration: Yes Moist: Yes Wound Preparation Ulcer Cleansing: Rinsed/Irrigated with Saline Topical Anesthetic Applied: Other: lidocaine 4%, Treatment Notes Wound #3 (Left Amputation Site - Below Knee) 1. Cleansed with: Clean wound with Normal Saline 2. Anesthetic Topical Lidocaine 4% cream to wound bed prior to debridement 4. Dressing Applied: Iodoform packing Gauze 5. Secondary Dressing Applied Bordered Foam Dressing Electronic Signature(s) Signed: 03/25/2015 4:58:02 PM By: [name] Gurney, RN, BSN, Kim RN, BSN Entered By: [name] Gurney, RN, BSN, Kim on 03/25/2015 10:38:57 [name] ([id number]) -------------------------------------------------------------------------------- Wound Assessment Details Patient Name: [name] Date of Service: 03/25/2015 10:15 AM Medical Record Number: [id number] Patient Account Number: [id number] Date of Birth/Sex: 27-Apr-1921 (81 y.o. Female) Treating RN: [name] Primary Care Physician: Terance Hart, DAVID Other Clinician: Referring Physician: Dorothey Baseman Treating Physician/Extender: Rudene Re in Treatment: 6 Wound Status Wound Number: 4 Primary Diabetic Wound/Ulcer of the Lower Etiology: Extremity Wound Location: Right Toe Third - Medial Wound Open Wounding Event: Gradually Appeared Status: Date Acquired: 02/25/2015 Comorbid Arrhythmia, Deep Vein Thrombosis, Weeks Of Treatment: 2 History: Peripheral Arterial Disease, Type II Clustered Wound: No Diabetes Wound Measurements Length: (cm) 0.4 Width: (cm) 0.2 Depth: (cm) 0.1 Area: (cm) 0.063 Volume: (cm) 0.006 % Reduction in Area: 50% % Reduction in Volume: 76% Epithelialization: None Wound  Description Classification: Grade 2 Wound Margin: Distinct, outline attached Exudate Amount:  Small Exudate Type: Serous Exudate Color: amber Wound Bed Granulation Amount: None Present (0%) Exposed Structure Necrotic Amount: Large (67-100%) Fascia Exposed: No Necrotic Quality: Adherent Slough Fat Layer Exposed: No Tendon Exposed: No Muscle Exposed: No Joint Exposed: No Bone Exposed: No Limited to Skin Breakdown Periwound Skin Texture Texture Color No Abnormalities Noted: No No Abnormalities Noted: No Callus: No Atrophie Blanche: No Crepitus: No Cyanosis: No Excoriation: No Ecchymosis: No Carnathan, Amil ([id number]) Fluctuance: No Erythema: No Friable: No Hemosiderin Staining: No Induration: No Mottled: No Localized Edema: No Pallor: No Rash: No Rubor: No Scarring: No Temperature / Pain Moisture Temperature: No Abnormality No Abnormalities Noted: No Tenderness on Palpation: Yes Dry / Scaly: No Maceration: No Moist: No Wound Preparation Ulcer Cleansing: Rinsed/Irrigated with Saline Topical Anesthetic Applied: Other: liodocaine 4%, Treatment Notes Wound #4 (Right, Medial Toe Third) 1. Cleansed with: Clean wound with Normal Saline 2. Anesthetic Topical Lidocaine 4% cream to wound bed prior to debridement 4. Dressing Applied: Santyl Ointment 5. Secondary Dressing Applied Gauze and Kerlix/Conform Electronic Signature(s) Signed: 03/25/2015 4:58:02 PM By: [name] Gurney, RN, BSN, Kim RN, BSN Entered By: [name] Gurney, RN, BSN, Kim on 03/25/2015 10:39:13 [name] ([id number]) -------------------------------------------------------------------------------- Vitals Details Patient Name: [name] Date of Service: 03/25/2015 10:15 AM Medical Record Number: [id number] Patient Account Number: [id number] Date of Birth/Sex: [birth year] (81 y.o. Female) Treating RN: [name] Primary Care Physician: Dorothey Baseman Other Clinician: Referring Physician: Dorothey Baseman Treating Physician/Extender: Rudene Re in Treatment: 6 Vital Signs Time Taken:  10:20 Temperature (F): 97.9 Height (in): 62 Pulse (bpm): 73 Weight (lbs): 155 Respiratory Rate (breaths/min): 18 Body Mass Index (BMI): 28.3 Blood Pressure (mmHg): 121/72 Reference Range: 80 - 120 mg / dl Electronic Signature(s) Signed: 03/25/2015 4:58:02 PM By: [name] Gurney, RN, BSN, Kim RN, BSN Entered By: [name] Gurney, RN, BSN, Kim on 03/25/2015 10:21:48

## 2015-04-01 ENCOUNTER — Encounter: Payer: Medicare Other | Attending: Surgery | Admitting: Surgery

## 2015-04-01 DIAGNOSIS — X58XXXA Exposure to other specified factors, initial encounter: Secondary | ICD-10-CM | POA: Diagnosis not present

## 2015-04-01 DIAGNOSIS — E11621 Type 2 diabetes mellitus with foot ulcer: Secondary | ICD-10-CM | POA: Diagnosis not present

## 2015-04-01 DIAGNOSIS — L97812 Non-pressure chronic ulcer of other part of right lower leg with fat layer exposed: Secondary | ICD-10-CM | POA: Diagnosis not present

## 2015-04-01 DIAGNOSIS — T8131XA Disruption of external operation (surgical) wound, not elsewhere classified, initial encounter: Secondary | ICD-10-CM | POA: Diagnosis not present

## 2015-04-01 DIAGNOSIS — I70235 Atherosclerosis of native arteries of right leg with ulceration of other part of foot: Secondary | ICD-10-CM | POA: Diagnosis not present

## 2015-04-01 DIAGNOSIS — Z89512 Acquired absence of left leg below knee: Secondary | ICD-10-CM | POA: Insufficient documentation

## 2015-04-01 DIAGNOSIS — L97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: Secondary | ICD-10-CM | POA: Insufficient documentation

## 2015-04-02 NOTE — Progress Notes (Signed)
COURTENY, EGLER ([id number]) Visit Report for 04/01/2015 Arrival Information Details Patient Name: [name], [name] Date of Service: 04/01/2015 10:30 AM Medical Record Number: [id number] Patient Account Number: [id number] Date of Birth/Sex: 03/25/1921 (81 y.o. Female) Treating RN: Huel Coventry Primary Care Physician: Dorothey Baseman Other Clinician: Referring Physician: Dorothey Baseman Treating Physician/Extender: Rudene Re in Treatment: 7 Visit Information History Since Last Visit Added or deleted any medications: No Patient Arrived: Wheel Chair Any new allergies or adverse reactions: No Arrival Time: 10:36 Had a fall or experienced change in No Accompanied By: son and daughter activities of daily living that may affect in law risk of falls: Transfer Assistance: Manual Signs or symptoms of abuse/neglect since last No Patient Identification Verified: Yes visito Secondary Verification Process Yes Hospitalized since last visit: No Completed: Has Dressing in Place as Prescribed: Yes Patient Has Alerts: Yes Pain Present Now: No Patient Alerts: DMII Electronic Signature(s) Signed: 04/01/2015 5:42:28 PM By: Elliot Gurney, RN, BSN, Kim RN, BSN Entered By: Elliot Gurney, RN, BSN, Kim on 04/01/2015 10:37:40 [name] ([id number]) -------------------------------------------------------------------------------- Encounter Discharge Information Details Patient Name: [name] Date of Service: 04/01/2015 10:30 AM Medical Record Number: [id number] Patient Account Number: [id number] Date of Birth/Sex: May 29, 1921 (81 y.o. Female) Treating RN: Huel Coventry Primary Care Physician: Dorothey Baseman Other Clinician: Referring Physician: Dorothey Baseman Treating Physician/Extender: Rudene Re in Treatment: 7 Encounter Discharge Information Items Discharge Pain Level: 0 Discharge Condition: Stable Ambulatory Status: Wheelchair Discharge Destination: Home Transportation: Private Auto son  and Accompanied By: daughter in law Schedule Follow-up Appointment: Yes Medication Reconciliation completed and provided to Patient/Care Yes Jasmon Graffam: Clinical Summary of Care: Electronic Signature(s) Signed: 04/01/2015 5:42:28 PM By: Elliot Gurney, RN, BSN, Kim RN, BSN Entered By: Elliot Gurney, RN, BSN, Kim on 04/01/2015 11:28:03 [name] ([id number]) -------------------------------------------------------------------------------- Lower Extremity Assessment Details Patient Name: [name] Date of Service: 04/01/2015 10:30 AM Medical Record Number: [id number] Patient Account Number: [id number] Date of Birth/Sex: [birth year] (81 y.o. Female) Treating RN: Huel Coventry Primary Care Physician: Dorothey Baseman Other Clinician: Referring Physician: Dorothey Baseman Treating Physician/Extender: Rudene Re in Treatment: 7 Vascular Assessment Pulses: Posterior Tibial Dorsalis Pedis Palpable: [Left:No] Doppler: [Left:Monophasic] Extremity colors, hair growth, and conditions: Extremity Color: [Left:Normal] Hair Growth on Extremity: [Left:Yes] Temperature of Extremity: [Left:Warm] Capillary Refill: [Left:< 3 seconds] Toe Nail Assessment Left: Right: Thick: No Discolored: No Deformed: No Improper Length and Hygiene: No Electronic Signature(s) Signed: 04/01/2015 5:42:28 PM By: Elliot Gurney, RN, BSN, Kim RN, BSN Entered By: Elliot Gurney, RN, BSN, Kim on 04/01/2015 10:47:44 [name] ([id number]) -------------------------------------------------------------------------------- Multi Wound Chart Details Patient Name: [name] Date of Service: 04/01/2015 10:30 AM Medical Record Number: [id number] Patient Account Number: [id number] Date of Birth/Sex: [birth year] (81 y.o. 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] Comorbid History: [1:N/A] [2:N/A] [3:N/A] Date Acquired: [1:11/04/2014] [2:11/04/2014] [3:01/03/2015] Weeks of Treatment: [1:7] [2:7] [3:6] Wound Status: [1:Open] [2:Open] [3:Open] Measurements L x W x D 1.8x2x0.3 [2:0.7x0.9x0.1] [3:0.9x5.5x0.9] (cm) Area (cm) : [1:2.827] [2:0.495] [3:3.888] Volume (cm) : [1:0.848] [2:0.049] [3:3.499] % Reduction in Area: [1:48.60%] [2:47.50%] [3:62.50%] % Reduction in Volume: -54.20% [2:47.90%] [3:15.60%]  Classification: [1:Full Thickness Without Exposed Support Structures] [2:Full Thickness Without Exposed Support Structures] [3:Full Thickness Without Exposed Support Structures] Exudate Amount: [1:N/A] [2:N/A] [3:N/A] Wound Margin: [1:N/A] [2:N/A] [3:N/A] Granulation Amount: [1:N/A] [2:N/A] [3:N/A] Necrotic Amount: [1:N/A] [2:N/A] [3:N/A] Necrotic Tissue: [1:N/A] [2:N/A] [3:N/A] Epithelialization: [1:N/A] [2:N/A] [3:N/A] Periwound Skin Texture: No Abnormalities Noted No Abnormalities Noted No Abnormalities Noted Periwound Skin [1:No Abnormalities Noted No Abnormalities Noted No Abnormalities Noted] Moisture: Periwound Skin Color: No Abnormalities Noted No Abnormalities Noted No Abnormalities Noted Tenderness on [1:No] [2:No] [3:No] Palpation: [name], [name] ([id number]) Wound Preparation: N/A N/A N/A Wound Number: 4 5 N/A Photos: No Photos No Photos N/A Wound Location: Right, Medial Toe Third Right Toe Fourth - Plantar N/A Wounding Event: Gradually Appeared Gradually Appeared  N/A 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 Lower Extremity Comorbid History: N/A Arrhythmia, Deep Vein N/A Thrombosis, Peripheral Arterial Disease, Type II Diabetes Date Acquired: 02/25/2015 03/24/2015 N/A Weeks of Treatment: 3 0 N/A Wound Status: Open Open N/A Measurements L x W x D 0.2x0.3x0.1 0.5x0.4x0.1 N/A (cm) Area (cm) : 0.047 0.157 N/A Volume (cm) : 0.005 0.016 N/A % Reduction in Area: 62.70% 0.00% N/A % Reduction in Volume: 80.00% 0.00% N/A Classification: Grade 2 Unable to visualize wound N/A bed Exudate Amount: N/A None Present N/A Wound Margin: N/A Indistinct, nonvisible N/A Granulation Amount: N/A None Present (0%) N/A Necrotic Amount: N/A Large (67-100%) N/A Necrotic Tissue: N/A Eschar N/A Epithelialization: N/A None N/A Periwound Skin Texture: No Abnormalities Noted Edema: No N/A Excoriation: No Induration: No Callus: No Crepitus: No Fluctuance: No Friable: No Rash: No Scarring: No Periwound Skin No Abnormalities Noted Dry/Scaly: Yes N/A Moisture: Maceration: No Moist: No Periwound Skin Color: No Abnormalities Noted Atrophie Blanche: No N/A Cyanosis: No Ecchymosis: No Erythema: No Hemosiderin Staining: No Mottled: No Pallor: No Rubor: No No No N/A [name], [name] ([id number]) Tenderness on Palpation: Wound Preparation: N/A Ulcer Cleansing: N/A Rinsed/Irrigated with Saline Topical Anesthetic Applied: Xylocaine 4% Topical Solution Treatment Notes Electronic Signature(s) Signed: 04/01/2015 5:42:28 PM By: Elliot GurneyWoody, RN, BSN, Kim RN, BSN Entered By: Elliot GurneyWoody, RN, BSN, Kim on 04/01/2015 11:03:07 [name] NationsSILETZKY, [name] ([id number]) -------------------------------------------------------------------------------- Multi-Disciplinary Care Plan Details Patient Name: [name] NationsSILETZKY, [name] Date of Service: 04/01/2015 10:30 AM Medical Record Number: [id number] Patient Account Number: [id number] Date of Birth/Sex:  07/15/1921 (81 y.o. Female) Treating RN: Huel CoventryWoody, Kim Primary Care Physician: Dorothey BasemanBRONSTEIN, DAVID Other Clinician: Referring Physician: Dorothey BasemanBRONSTEIN, DAVID Treating Physician/Extender: Rudene ReBritto, [name] Weeks in Treatment: 7 Active Inactive Abuse / Safety / Falls / Self Care Management Nursing Diagnoses: Impaired physical mobility Potential for falls Goals: Patient will remain injury free Date Initiated: 02/11/2015 Goal Status: Active Patient/caregiver will verbalize understanding of skin care regimen Date Initiated: 02/11/2015 Goal Status: Active Patient/caregiver will verbalize/demonstrate measures taken to prevent injury and/or falls Date Initiated: 02/11/2015 Goal Status: Active Patient/caregiver will verbalize/demonstrate understanding of what to do in case of emergency Date Initiated: 02/11/2015 Goal Status: Active Interventions: Assess fall risk on admission and as needed Provide education on fall prevention Provide education on safe transfers Treatment Activities: Patient referred to home care : 04/01/2015 Notes: Nutrition Nursing Diagnoses: Imbalanced nutrition Potential for alteratiion in Nutrition/Potential for imbalanced nutrition Calcaterra, Shawnna ([id number]) Goals: Patient/caregiver verbalizes understanding of need to maintain therapeutic glucose control per primary care physician Date Initiated: 02/11/2015 Goal Status: Active Patient/caregiver will maintain therapeutic glucose control Date Initiated: 02/11/2015 Goal Status: Active Interventions: Assess HgA1c results as ordered upon admission and as needed Provide education on elevated blood sugars and impact on wound  healing Provide education on nutrition Treatment Activities: Obtain HgA1c : 04/01/2015 Notes: Wound/Skin Impairment Nursing Diagnoses: Impaired tissue integrity Knowledge deficit related to ulceration/compromised skin integrity Goals: Patient/caregiver will verbalize understanding of skin care regimen Date  Initiated: 02/11/2015 Goal Status: Active Ulcer/skin breakdown will heal within 14 weeks Date Initiated: 02/11/2015 Goal Status: Active Interventions: Assess patient/caregiver ability to obtain necessary supplies Assess patient/caregiver ability to perform ulcer/skin care regimen upon admission and as needed Assess ulceration(s) every visit Provide education on ulcer and skin care Treatment Activities: Skin care regimen initiated : 04/01/2015 Topical wound management initiated : 04/01/2015 Notes: [name], [name] ([id number]) Electronic Signature(s) Signed: 04/01/2015 5:42:28 PM By: Elliot Gurney, RN, BSN, Kim RN, BSN Entered By: Elliot Gurney, RN, BSN, Kim on 04/01/2015 11:02:24 [name] ([id number]) -------------------------------------------------------------------------------- Patient/Caregiver Education Details Patient Name: [name] Date of Service: 04/01/2015 10:30 AM Medical Record Number: [id number] Patient Account Number: [id number] Date of Birth/Gender: 23-Oct-1920 (81 y.o. Female) Treating RN: Huel Coventry Primary Care Physician: Dorothey Baseman Other Clinician: Referring Physician: Dorothey Baseman Treating Physician/Extender: Rudene Re in Treatment: 7 Education Assessment Education Provided To: Patient Education Topics Provided Wound/Skin Impairment: Handouts: Other: continue wound care as prescribed Electronic Signature(s) Signed: 04/01/2015 5:42:28 PM By: Elliot Gurney RN, BSN, Kim RN, BSN Entered By: Elliot Gurney, RN, BSN, Kim on 04/01/2015 11:28:54 [name] ([id number]) -------------------------------------------------------------------------------- Wound Assessment Details Patient Name: [name] Date of Service: 04/01/2015 10:30 AM Medical Record Number: [id number] Patient Account Number: [id number] Date of Birth/Sex: 1921-03-09 (81 y.o. Female) Treating RN: Huel Coventry Primary Care Physician: Dorothey Baseman Other Clinician: Referring Physician: Terance Hart,  DAVID Treating Physician/Extender: Rudene Re in Treatment: 7 Wound Status Wound Number: 1 Primary Etiology: Arterial Insufficiency Ulcer Wound Location: Right Calcaneous Wound Status: Open Wounding Event: Gradually Appeared Date Acquired: 11/04/2014 Weeks Of Treatment: 7 Clustered Wound: No Photos Photo Uploaded By: Elliot Gurney, RN, BSN, Kim on 04/01/2015 11:55:21 Wound Measurements Length: (cm) 1.8 Width: (cm) 2 Depth: (cm) 0.3 Area: (cm) 2.827 Volume: (cm) 0.848 % Reduction in Area: 48.6% % Reduction in Volume: -54.2% Wound Description Full Thickness Without Exposed Classification: Support Structures Periwound Skin Texture Texture Color No Abnormalities Noted: No No Abnormalities Noted: No Moisture No Abnormalities Noted: No Treatment Notes Wound #1 (Right Calcaneous) [name], [name] ([id number]) 1. Cleansed with: Clean wound with Normal Saline 2. Anesthetic Topical Lidocaine 4% cream to wound bed prior to debridement 4. Dressing Applied: Santyl Ointment 5. Secondary Dressing Applied Bordered Foam Dressing Gauze and Kerlix/Conform Electronic Signature(s) Signed: 04/01/2015 5:42:28 PM By: Elliot Gurney, RN, BSN, Kim RN, BSN Entered By: Elliot Gurney, RN, BSN, Kim on 04/01/2015 10:56:13 [name] ([id number]) -------------------------------------------------------------------------------- Wound Assessment Details Patient Name: [name] Date of Service: 04/01/2015 10:30 AM Medical Record Number: [id number] Patient Account Number: [id number] Date of Birth/Sex: 12-12-1920 (81 y.o. Female) Treating RN: Huel Coventry Primary Care Physician: Dorothey Baseman Other Clinician: Referring Physician: Terance Hart, DAVID Treating Physician/Extender: Rudene Re in Treatment: 7 Wound Status Wound Number: 2 Primary Etiology: Arterial Insufficiency Ulcer Wound Location: Right Toe Third Wound Status: Open Wounding Event: Gradually Appeared Date Acquired: 11/04/2014 Weeks  Of Treatment: 7 Clustered Wound: No Photos Photo Uploaded By: Elliot Gurney, RN, BSN, Kim on 04/01/2015 11:55:22 Wound Measurements Length: (cm) 0.7 Width: (cm) 0.9 Depth: (cm) 0.1 Area: (cm) 0.495 Volume: (cm) 0.049 % Reduction in Area: 47.5% % Reduction in Volume: 47.9% Wound Description Full Thickness Without Exposed Classification: Support Structures Periwound Skin Texture Texture Color No Abnormalities Noted: No No Abnormalities Noted: No Moisture No Abnormalities Noted: No Treatment Notes Wound #2 (Right Toe Third)  [name], [name] ([id number]) 1. Cleansed with: Clean wound with Normal Saline 2. Anesthetic Topical Lidocaine 4% cream to wound bed prior to debridement 4. Dressing Applied: Santyl Ointment 5. Secondary Dressing Applied Bordered Foam Dressing Gauze and Kerlix/Conform Electronic Signature(s) Signed: 04/01/2015 5:42:28 PM By: Elliot Gurney, RN, BSN, Kim RN, BSN Entered By: Elliot Gurney, RN, BSN, Kim on 04/01/2015 10:56:14 [name] ([id number]) -------------------------------------------------------------------------------- Wound Assessment Details Patient Name: [name] Date of Service: 04/01/2015 10:30 AM Medical Record Number: [id number] Patient Account Number: [id number] Date of Birth/Sex: 1920-12-28 (81 y.o. Female) Treating RN: Huel Coventry Primary Care Physician: Dorothey Baseman Other Clinician: Referring Physician: Terance Hart, DAVID Treating Physician/Extender: Rudene Re in Treatment: 7 Wound Status Wound Number: 3 Primary Etiology: Dehisced Wound Wound Location: Left Amputation Site - Below Wound Status: Open Knee Wounding Event: Surgical Injury Date Acquired: 01/03/2015 Weeks Of Treatment: 6 Clustered Wound: No Wound Measurements Length: (cm) 0.9 Width: (cm) 5.5 Depth: (cm) 0.9 Area: (cm) 3.888 Volume: (cm) 3.499 % Reduction in Area: 62.5% % Reduction in Volume: 15.6% Wound Description Full Thickness Without  Exposed Classification: Support Structures Periwound Skin Texture Texture Color No Abnormalities Noted: No No Abnormalities Noted: No Moisture No Abnormalities Noted: No Treatment Notes Wound #3 (Left Amputation Site - Below Knee) 1. Cleansed with: Clean wound with Normal Saline 2. Anesthetic Topical Lidocaine 4% cream to wound bed prior to debridement 4. Dressing Applied: Santyl Ointment 5. Secondary Dressing Applied Bordered Foam Dressing Gauze and Kerlix/Conform Electronic Signature(s) [name], [name] ([id number]) Signed: 04/01/2015 5:42:28 PM By: Elliot Gurney RN, BSN, Kim RN, BSN Entered By: Elliot Gurney, RN, BSN, Kim on 04/01/2015 10:56:14 [name] ([id number]) -------------------------------------------------------------------------------- Wound Assessment Details Patient Name: [name] Date of Service: 04/01/2015 10:30 AM Medical Record Number: [id number] Patient Account Number: [id number] Date of Birth/Sex: 07-Dec-1920 (81 y.o. Female) Treating RN: Huel Coventry Primary Care Physician: Dorothey Baseman Other Clinician: Referring Physician: Terance Hart, DAVID Treating Physician/Extender: Rudene Re in Treatment: 7 Wound Status Wound Number: 4 Primary Diabetic Wound/Ulcer of the Lower Etiology: Extremity Wound Location: Right, Medial Toe Third Wound Status: Open Wounding Event: Gradually Appeared Date Acquired: 02/25/2015 Weeks Of Treatment: 3 Clustered Wound: No Photos Photo Uploaded By: Elliot Gurney, RN, BSN, Kim on 04/01/2015 12:26:13 Wound Measurements Length: (cm) 0.2 Width: (cm) 0.3 Depth: (cm) 0.1 Area: (cm) 0.047 Volume: (cm) 0.005 % Reduction in Area: 62.7% % Reduction in Volume: 80% Wound Description Classification: Grade 2 Periwound Skin Texture Texture Color No Abnormalities Noted: No No Abnormalities Noted: No Moisture No Abnormalities Noted: No Treatment Notes Wound #4 (Right, Medial Toe Third) 1. Cleansed with: [name], [name]  ([id number]) Clean wound with Normal Saline 2. Anesthetic Topical Lidocaine 4% cream to wound bed prior to debridement 4. Dressing Applied: Santyl Ointment 5. Secondary Dressing Applied Bordered Foam Dressing Gauze and Kerlix/Conform Electronic Signature(s) Signed: 04/01/2015 5:42:28 PM By: Elliot Gurney, RN, BSN, Kim RN, BSN Entered By: Elliot Gurney, RN, BSN, Kim on 04/01/2015 10:56:14 [name] ([id number]) -------------------------------------------------------------------------------- Wound Assessment Details Patient Name: [name] Date of Service: 04/01/2015 10:30 AM Medical Record Number: [id number] Patient Account Number: [id number] Date of Birth/Sex: 1921/03/16 (81 y.o. Female) Treating RN: Huel Coventry Primary Care Physician: Dorothey Baseman Other Clinician: Referring Physician: Dorothey Baseman Treating Physician/Extender: Rudene Re in Treatment: 7 Wound Status Wound Number: 5 Primary Diabetic Wound/Ulcer of the Lower Etiology: Extremity Wound Location: Right Toe Fourth - Plantar Wound Open Wounding Event: Gradually Appeared Status: Date Acquired: 03/24/2015 Comorbid Arrhythmia, Deep Vein Thrombosis, Weeks Of Treatment: 0 History: Peripheral Arterial Disease, Type II Clustered Wound: No Diabetes Photos  Photo Uploaded By: Elliot Gurney, RN, BSN, Kim on 04/01/2015 12:26:14 Wound Measurements Length: (cm) 0.5 Width: (cm) 0.4 Depth: (cm) 0.1 Area: (cm) 0.157 Volume: (cm) 0.016 % Reduction in Area: 0% % Reduction in Volume: 0% Epithelialization: None Wound Description Classification: Unable to visualize wound bed Wound Margin: Indistinct, nonvisible Exudate Amount: None Present Wound Bed Granulation Amount: None Present (0%) Exposed Structure Necrotic Amount: Large (67-100%) Fascia Exposed: No Necrotic Quality: Eschar Fat Layer Exposed: No Tendon Exposed: No Muscle Exposed: No Joint Exposed: No [name], [name] ([id number]) Bone Exposed: No Limited to Skin  Breakdown Periwound Skin Texture Texture Color No Abnormalities Noted: No No Abnormalities Noted: No Callus: No Atrophie Blanche: No Crepitus: No Cyanosis: No Excoriation: No Ecchymosis: No Fluctuance: No Erythema: No Friable: No Hemosiderin Staining: No Induration: No Mottled: No Localized Edema: No Pallor: No Rash: No Rubor: No Scarring: No Moisture No Abnormalities Noted: No Dry / Scaly: Yes Maceration: No Moist: No Wound Preparation Ulcer Cleansing: Rinsed/Irrigated with Saline Topical Anesthetic Applied: Xylocaine 4% Topical Solution Treatment Notes Wound #5 (Right, Plantar Toe Fourth) 1. Cleansed with: Clean wound with Normal Saline 2. Anesthetic Topical Lidocaine 4% cream to wound bed prior to debridement Notes [name] for protection Electronic Signature(s) Signed: 04/01/2015 5:42:28 PM By: Elliot Gurney, RN, BSN, Kim RN, BSN Entered By: Elliot Gurney, RN, BSN, Kim on 04/01/2015 10:57:51 [name] ([id number]) -------------------------------------------------------------------------------- Vitals Details Patient Name: [name] Date of Service: 04/01/2015 10:30 AM Medical Record Number: [id number] Patient Account Number: [id number] Date of Birth/Sex: 04-09-1921 (81 y.o. Female) Treating RN: Huel Coventry Primary Care Physician: Dorothey Baseman Other Clinician: Referring Physician: Dorothey Baseman Treating Physician/Extender: Rudene Re in Treatment: 7 Vital Signs Time Taken: 10:41 Temperature (F): 97.5 Height (in): 62 Pulse (bpm): 68 Weight (lbs): 155 Respiratory Rate (breaths/min): 18 Body Mass Index (BMI): 28.3 Blood Pressure (mmHg): 110/68 Reference Range: 80 - 120 mg / dl Electronic Signature(s) Signed: 04/01/2015 5:42:28 PM By: Elliot Gurney, RN, BSN, Kim RN, BSN Entered By: Elliot Gurney, RN, BSN, Kim on 04/01/2015 10:41:19

## 2015-04-02 NOTE — Progress Notes (Addendum)
ARDETH, REPETTO ([id number]) Visit Report for 04/01/2015 Chief Complaint Document Details Patient Name: [name], [name] Date of Service: 04/01/2015 10:30 AM Medical Record Number: [id number] Patient Account Number: [id number] Date of Birth/Sex: 04-03-1921 (81 y.o. Female) Treating RN: Primary Care Physician: [name] Other Clinician: Referring Physician: Terance Hart, [name] Treating Physician/Extender: [name] in Treatment: 7 Information Obtained from: Patient Chief Complaint Patient presents to the wound care center for a consult due non healing wound. 81 year old patient with comes with a history of a ulcerated area to the right third toe and the right heel which she's had for about 2 months. Electronic Signature(s) Signed: 04/01/2015 12:24:54 PM By: [name] Kanner MD, FACS Entered By: [name] on 04/01/2015 11:15:31 [name] ([id number]) -------------------------------------------------------------------------------- Debridement Details Patient Name: [name] Date of Service: 04/01/2015 10:30 AM Medical Record Number: [id number] Patient Account Number: [id number] Date of Birth/Sex: 06/28/1921 (81 y.o. Female) Treating RN: Primary Care Physician: [name] Other Clinician: Referring Physician: Terance Hart, [name] Treating Physician/Extender: [name] in Treatment: 7 Debridement Performed for Wound #1 Right Calcaneous Assessment: Performed By: Physician [name] Schroeder., MD Debridement: Debridement Pre-procedure Yes Verification/Time Out Taken: Start Time: 10:58 Pain Control: Lidocaine 5% topical ointment Level: Skin/Subcutaneous Tissue Total Area Debrided (L x 1.8 (cm) x 2 (cm) = 3.6 (cm) W): Tissue and other Viable, Non-Viable, Fibrin/Slough, Subcutaneous material debrided: Bleeding: Minimum Hemostasis Achieved: Pressure End Time: 11:07 Procedural Pain: 2 Post Procedural Pain: 0 Response to Treatment: Procedure was tolerated  well Post Debridement Measurements of Total Wound Length: (cm) 1.8 Width: (cm) 2 Depth: (cm) 0.3 Volume: (cm) 0.848 Electronic Signature(s) Signed: 04/01/2015 12:24:54 PM By: [name] Kanner MD, FACS Entered By: [name] on 04/01/2015 11:14:08 [name] ([id number]) -------------------------------------------------------------------------------- Debridement Details Patient Name: [name] Date of Service: 04/01/2015 10:30 AM Medical Record Number: [id number] Patient Account Number: [id number] Date of Birth/Sex: 02-24-1921 (81 y.o. Female) Treating RN: Primary Care Physician: [name] Other Clinician: Referring Physician: Terance Hart, [name] Treating Physician/Extender: [name] in Treatment: 7 Debridement Performed for Wound #3 [name] Amputation Site - Below Knee Assessment: Performed By: Physician [name] Schroeder., MD Debridement: Debridement Pre-procedure Yes Verification/Time Out Taken: Start Time: 10:58 Pain Control: Lidocaine 5% topical ointment Level: Skin/Subcutaneous Tissue Total Area Debrided (L x 0.9 (cm) x 5.5 (cm) = 4.95 (cm) W): Tissue and other Viable, Non-Viable, Fibrin/Slough, Subcutaneous material debrided: Instrument: Curette Bleeding: Minimum Hemostasis Achieved: Pressure End Time: 11:07 Procedural Pain: 2 Post Procedural Pain: 0 Response to Treatment: Procedure was tolerated well Post Debridement Measurements of Total Wound Length: (cm) 0.9 Width: (cm) 5.5 Depth: (cm) 0.9 Volume: (cm) 3.499 Electronic Signature(s) Signed: 04/01/2015 12:24:54 PM By: [name] Kanner MD, FACS Entered By: [name] on 04/01/2015 11:15:21 [name] ([id number]) -------------------------------------------------------------------------------- Debridement Details Patient Name: [name] Date of Service: 04/01/2015 10:30 AM Medical Record Number: [id number] Patient Account Number: [id number] Date of Birth/Sex: May 06, 1921 (81 y.o.  Female) Treating RN: [name] Primary Care Physician: [name] Other Clinician: Referring Physician: Dorothey [name] Treating Physician/Extender: [name] in Treatment: 7 Debridement Performed for Wound #2 Right Toe Third Assessment: Performed By: Physician [name] Schroeder., MD Debridement: Debridement Start Time: 10:58 End Time: 11:09 Electronic Signature(s) Signed: 04/01/2015 12:24:54 PM By: [name] Kanner MD, FACS Signed: 04/01/2015 5:42:28 PM By: [name] Gurney RN, [name], Kim RN, [name] Entered By: [name] Gurney, RN, [name], Kim on 04/01/2015 11:26:02 [name] ([id number]) -------------------------------------------------------------------------------- HPI Details Patient Name: [name] Date of Service: 04/01/2015 10:30 AM Medical Record Number: [id number] Patient Account Number: [id number] Date of Birth/Sex:  March 01, 1921 (81 y.o. Female) Treating RN: Primary Care Physician: [name] Other Clinician: Referring Physician: Terance Hart, [name] Treating Physician/Extender: [name] in Treatment: 7 History of Present Illness Location: right medial heel and right third toe Quality: Patient reports experiencing a dull pain to affected area(s). Severity: Patient states wound are getting worse. Duration: Patient has had the wound for > 3 months prior to seeking treatment at the wound center Timing: Pain in wound is Intermittent (comes and goes Context: The wound appeared gradually over time Modifying Factors: Consults to this date include:vascular surgeon and several recent surgeries. Associated Signs and Symptoms: Patient reports having foul odor and drainage from the [name]. HPI Description: A pleasant 81 year old patient who is known to have diabetes mellitus for several years has recently gone through a series of operations with the vascular surgeon Dr. Gilda [name]. In January and February she had several surgeries on the [name] lower  extremity with an attempt to have limb salvage for gangrenous changes of her forefoot. 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 On 01/07/2015 she also had a right lower extremity distal runoff with a angioplasty of the right anterior tibial artery to maximize her blood flow to the foot. She recently had her staples removed at Dr. Marijean Heath office on 01/31/2015 and at that time a right lower extremity duplex was done which showed patent vessels and a patent stent with distal occluded posterior tibial artery. Though the duplex was noncritical the recommendations from the PA at the vascular surgery office was that of a angiogram to be done but the patient said she would rather try some bone care before. The patient has received doxycycline and Cipro in the recent past and she takes oral medications for her diabetes. Other than that I reviewed her list of all her medications. No recent hemoglobin A1c has been done and no recent x-rays of the right foot have been done. 02/18/2015 -- x-ray of the right foot was done on 02/11/2015 and it shows no evidence of acute osteomyelitis of the third toe or of the calcaneus. She had gone to the vascular surgery office and they had noted that there is dehiscence of [name] of the below-knee wound and they have asked [place] to kindly take over the care of this. There've been doing dressings for the right heel and right third toe. 02/25/2015 -- we have received notes from the vascular group who saw her last on 02/13/2015 and she was seen by the PA Ms. Cleda Daub. She had recommended that the patient continue to follow with [place] for wound care including management of [name], which has had dehiscence of the lateral part. They will consider repeating a arterial duplex or angiogram if the right lower extremity does not heal within a reasonable  period of time. 03/18/2015 -- he saw the vascular surgeon Dr. Gilda [name] and he has set her up for an angioplasty sometime in the middle of July. she is doing well otherwise. [name], [name] ([id number]) Electronic Signature(s) Signed: 04/01/2015 12:24:54 PM By: [name] Kanner MD, FACS Entered By: [name] on 04/01/2015 11:15:41 [name] ([id number]) -------------------------------------------------------------------------------- Physical Exam Details Patient Name: [name] Date of Service: 04/01/2015 10:30 AM Medical Record Number: [id number] Patient Account Number: [id number] Date of Birth/Sex: 1921-06-01 (81 y.o. Female) Treating RN: Primary Care Physician: [name] Other Clinician: Referring Physician: Terance Hart [name] Treating Physician/Extender: [name]  Weeks in Treatment: 7 Constitutional . Pulse regular. Respirations normal and unlabored. Afebrile. . Eyes Nonicteric. Reactive to light. Ears, Nose, Mouth, and Throat Lips, teeth, and gums WNL.Marland Kitchen Moist mucosa without lesions . Neck supple and nontender. No palpable supraclavicular or cervical adenopathy. Normal sized without goiter. Respiratory WNL. No retractions.. Cardiovascular Pedal Pulses Weak RLE. No clubbing, cyanosis or edema. Integumentary (Hair, Skin) No suspicious lesions. No crepitus or fluctuance. No peri-wound warmth or erythema. No masses.Marland Kitchen Psychiatric Judgement and insight Intact.. No evidence of depression, anxiety, or agitation.. Notes There is a new eschar on the right fourth toe dorsum and there is no open ulceration. The rest of the right foot ulcerations have minimal slough which is sharply debrided. Electronic Signature(s) Signed: 04/01/2015 12:24:54 PM By: [name] Kanner MD, FACS Entered By: [name] on 04/01/2015 11:16:48 [name] ([id number]) -------------------------------------------------------------------------------- Physician Orders Details Patient Name:  [name] Date of Service: 04/01/2015 10:30 AM Medical Record Number: [id number] Patient Account Number: [id number] Date of Birth/Sex: [birth year] (81 y.o. Female) Treating RN: [name] Primary Care Physician: [name] Other Clinician: Referring Physician: Dorothey [name] Treating Physician/Extender: [name] in Treatment: 7 Verbal / Phone Orders: Yes Clinician: Huel [name] Read Back and Verified: Yes Diagnosis Coding Wound Cleansing Wound #1 Right Calcaneous o Clean wound with Normal Saline. Wound #2 Right Toe Third o Clean wound with Normal Saline. Wound #3 [name] Amputation Site - Below Knee o Clean wound with Normal Saline. Wound #4 Right,Medial Toe Third o Clean wound with Normal Saline. Wound #5 Right,Plantar Toe Fourth o Clean wound with Normal Saline. Anesthetic Wound #1 Right Calcaneous o Topical Lidocaine 4% cream applied to wound bed prior to debridement Wound #2 Right Toe Third o Topical Lidocaine 4% cream applied to wound bed prior to debridement Wound #3 [name] Amputation Site - Below Knee o Topical Lidocaine 4% cream applied to wound bed prior to debridement Wound #4 Right,Medial Toe Third o Topical Lidocaine 4% cream applied to wound bed prior to debridement Wound #5 Right,Plantar Toe Fourth o Topical Lidocaine 4% cream applied to wound bed prior to debridement Primary Wound Dressing Wound #1 Right Calcaneous o Santyl Ointment Wound #2 Right Toe Third Patteson, Lorea ([id number]) o Santyl Ointment Wound #3 [name] Amputation Site - Below Knee o Santyl Ointment Wound #4 Right,Medial Toe Third o Santyl Ointment Wound #5 Right,Plantar Toe Fourth o Santyl Ointment Secondary Dressing Wound #1 Right Calcaneous o Gauze and Kerlix/Conform Wound #2 Right Toe Third o Gauze and Kerlix/Conform Wound #4 Right,Medial Toe Third o Gauze and Kerlix/Conform Wound #5 Right,Plantar Toe Fourth o Gauze and  Kerlix/Conform Wound #3 [name] Amputation Site - Below Knee o Boardered Foam Dressing Dressing Change Frequency Wound #1 Right Calcaneous o Change dressing every day. Wound #2 Right Toe Third o Change dressing every day. Wound #4 Right,Medial Toe Third o Change dressing every day. Wound #5 Right,Plantar Toe Fourth o Change dressing every day. Wound #3 [name] Amputation Site - Below Knee o Change dressing every day. Follow-up Appointments Wound #1 Right Calcaneous o Return Appointment in 1 week. Wound #2 Right Toe Third [name], [name] ([id number]) o Return Appointment in 1 week. Wound #3 [name] Amputation Site - Below Knee o Return Appointment in 1 week. Wound #4 Right,Medial Toe Third o Return Appointment in 1 week. Home Health Wound #1 Right Calcaneous o Continue Home Health Visits o Home Health Nurse may visit PRN to address patientos wound care needs. o FACE TO FACE ENCOUNTER: MEDICARE and MEDICAID PATIENTS: I certify that this  patient is under my care and that I had a face-to-face encounter that meets the physician face-to-face encounter requirements with this patient on this date. The encounter with the patient was in whole or in part for the following MEDICAL CONDITION: (primary reason for Home Healthcare) MEDICAL NECESSITY: I certify, that based on my findings, NURSING services are a medically necessary home health service. HOME BOUND STATUS: I certify that my clinical findings support that this patient is homebound (i.e., Due to illness or injury, pt requires aid of supportive devices such as crutches, cane, wheelchairs, walkers, the use of special transportation or the assistance of another person to leave their place of residence. There is a normal inability to leave the home and doing so requires considerable and taxing effort. Other absences are for medical reasons / religious services and are infrequent or of short duration when for other  reasons). o If current dressing causes regression in wound condition, may D/C ordered dressing product/s and apply Normal Saline Moist Dressing daily until next Wound Healing Center / Other MD appointment. Notify Wound Healing Center of regression in wound condition at [phone]. o Please direct any NON-WOUND related issues/requests for orders to patient's Primary Care Physician Wound #2 Right Toe Third o Continue Home Health Visits o Home Health Nurse may visit PRN to address patientos wound care needs. o FACE TO FACE ENCOUNTER: MEDICARE and MEDICAID PATIENTS: I certify that this patient is under my care and that I had a face-to-face encounter that meets the physician face-to-face encounter requirements with this patient on this date. The encounter with the patient was in whole or in part for the following MEDICAL CONDITION: (primary reason for Home Healthcare) MEDICAL NECESSITY: I certify, that based on my findings, NURSING services are a medically necessary home health service. HOME BOUND STATUS: I certify that my clinical findings support that this patient is homebound (i.e., Due to illness or injury, pt requires aid of supportive devices such as crutches, cane, wheelchairs, walkers, the use of special transportation or the assistance of another person to leave their place of residence. There is a normal inability to leave the home and doing so requires considerable and taxing effort. Other absences are for medical reasons / religious services and are infrequent or of short duration when for other reasons). o If current dressing causes regression in wound condition, may D/C ordered dressing product/s and apply Normal Saline Moist Dressing daily until next Wound Healing Center / Other MD appointment. Notify Wound Healing Center of regression in wound condition at [phone]. o Please direct any NON-WOUND related issues/requests for orders to patient's Primary  Care Physician [name], [name] ([id number]) Wound #3 [name] Amputation Site - Below Knee o Continue Home Health Visits o Home Health Nurse may visit PRN to address patientos wound care needs. o FACE TO FACE ENCOUNTER: MEDICARE and MEDICAID PATIENTS: I certify that this patient is under my care and that I had a face-to-face encounter that meets the physician face-to-face encounter requirements with this patient on this date. The encounter with the patient was in whole or in part for the following MEDICAL CONDITION: (primary reason for Home Healthcare) MEDICAL NECESSITY: I certify, that based on my findings, NURSING services are a medically necessary home health service. HOME BOUND STATUS: I certify that my clinical findings support that this patient is homebound (i.e., Due to illness or injury, pt requires aid of supportive devices such as crutches, cane, wheelchairs, walkers, the use of special transportation or the assistance of another person  to leave their place of residence. There is a normal inability to leave the home and doing so requires considerable and taxing effort. Other absences are for medical reasons / religious services and are infrequent or of short duration when for other reasons). o If current dressing causes regression in wound condition, may D/C ordered dressing product/s and apply Normal Saline Moist Dressing daily until next Wound Healing Center / Other MD appointment. Notify Wound Healing Center of regression in wound condition at [phone]. o Please direct any NON-WOUND related issues/requests for orders to patient's Primary Care Physician Wound #4 Right,Medial Toe Third o Continue Home Health Visits o Home Health Nurse may visit PRN to address patientos wound care needs. o FACE TO FACE ENCOUNTER: MEDICARE and MEDICAID PATIENTS: I certify that this patient is under my care and that I had a face-to-face encounter that meets the physician  face-to-face encounter requirements with this patient on this date. The encounter with the patient was in whole or in part for the following MEDICAL CONDITION: (primary reason for Home Healthcare) MEDICAL NECESSITY: I certify, that based on my findings, NURSING services are a medically necessary home health service. HOME BOUND STATUS: I certify that my clinical findings support that this patient is homebound (i.e., Due to illness or injury, pt requires aid of supportive devices such as crutches, cane, wheelchairs, walkers, the use of special transportation or the assistance of another person to leave their place of residence. There is a normal inability to leave the home and doing so requires considerable and taxing effort. Other absences are for medical reasons / religious services and are infrequent or of short duration when for other reasons). o If current dressing causes regression in wound condition, may D/C ordered dressing product/s and apply Normal Saline Moist Dressing daily until next Wound Healing Center / Other MD appointment. Notify Wound Healing Center of regression in wound condition at [phone]. o Please direct any NON-WOUND related issues/requests for orders to patient's Primary Care Physician Electronic Signature(s) Signed: 04/01/2015 12:24:54 PM By: [name] Kanner MD, FACS Signed: 04/01/2015 5:42:28 PM By: [name] Gurney RN, [name], Kim RN, [name] Entered By: [name] Gurney, RN, [name], Kim on 04/01/2015 11:06:48 [name] ([id number]) [name], [name] ([id number]) -------------------------------------------------------------------------------- Problem List Details Patient Name: [name] Date of Service: 04/01/2015 10:30 AM Medical Record Number: [id number] Patient Account Number: [id number] Date of Birth/Sex: Oct 10, 1920 (81 y.o. 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 L97.812 Non-pressure chronic ulcer of other part of right lower leg 02/11/2015 Yes with fat layer exposed T81.31XA Disruption of external operation (surgical) wound, not 02/18/2015 Yes elsewhere classified, initial encounter Inactive Problems Resolved Problems Electronic Signature(s) Signed: 04/01/2015 12:24:54 PM By: [name] Kanner MD, FACS Entered By: [name] on 04/01/2015 11:13:18 [name] ([id number]) -------------------------------------------------------------------------------- Progress Note Details Patient Name: [name] Date of Service: 04/01/2015 10:30 AM Medical Record Number: [id number] Patient Account Number: [id number] Date of Birth/Sex: [birth year] (81 y.o. Female) Treating RN: Primary Care Physician: [name] Other Clinician: Referring Physician: Terance Hart, [name] Treating Physician/Extender: [name] in Treatment: 7 Subjective Chief Complaint Information  obtained from Patient Patient presents to the wound care center for a consult due non healing wound. 81 year old patient with comes with a history of a ulcerated area to the right third toe and the right heel which she's had for about 2 months. History of Present Illness (HPI) The following HPI elements were documented for the patient's wound: Location: right medial heel and right third toe Quality: Patient reports experiencing a dull pain to affected area(s). Severity: Patient states wound are getting worse. Duration: Patient has had the wound for > 3  months prior to seeking treatment at the wound center Timing: Pain in wound is Intermittent (comes and goes Context: The wound appeared gradually over time Modifying Factors: Consults to this date include:vascular surgeon and several recent surgeries. Associated Signs and Symptoms: Patient reports having foul odor and drainage from the [name]. A pleasant 81 year old patient who is known to have diabetes mellitus for several years has recently gone through a series of operations with the vascular surgeon Dr. Gilda [name]. In January and February she had several surgeries on [name]. 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 On 01/07/2015 she also had a right lower extremity distal runoff with a angioplasty of the right anterior tibial artery to maximize her blood flow to the foot. She recently had her staples removed at Dr. Marijean Heath office on 01/31/2015 and at that time a right lower extremity duplex was done which showed patent vessels and a patent stent with distal occluded posterior tibial artery. Though the duplex was noncritical the recommendations from the PA at the vascular surgery office was that of a angiogram to be done but the patient said she would rather try some bone care before. The patient has received doxycycline and Cipro in the recent past and she takes oral medications for her diabetes. Other than that I reviewed her list of all her medications. No recent hemoglobin A1c has been done and no recent x-rays of the right foot have been done. 02/18/2015 -- x-ray of the right foot was done on 02/11/2015 and it shows no evidence of acute osteomyelitis of the third toe or of the calcaneus. She had gone to the vascular surgery office and they had noted that there is dehiscence of the [name] part  of the amputation site of the below-knee wound and they have asked [place] to kindly take over the care of this. There've been doing dressings for the right heel and right third toe. [name], [name] ([id number]) 02/25/2015 -- we have received notes from the vascular group who saw her last on 02/13/2015 and she was seen by the PA Ms. Cleda Daub. She had recommended that the patient continue to follow with [place] for wound care including management of [name], which has had dehiscence of the lateral part. They will consider repeating a arterial duplex or angiogram if the right lower extremity does not heal within a reasonable period of time. 03/18/2015 -- he saw the vascular surgeon Dr. Gilda [name] and he has set her up for an angioplasty sometime in the middle of July. she is doing well otherwise. Objective Constitutional Pulse regular. Respirations normal and unlabored. Afebrile. Vitals Time Taken: 10:41 AM, Height: 62 in, Weight: 155 lbs, BMI: 28.3, Temperature: 97.5 F, Pulse: 68 bpm, Respiratory Rate: 18 breaths/min, Blood Pressure: 110/68 mmHg. Eyes Nonicteric. Reactive  to light. Ears, Nose, Mouth, and Throat Lips, teeth, and gums WNL.Marland Kitchen Moist mucosa without lesions . Neck supple and nontender. No palpable supraclavicular or cervical adenopathy. Normal sized without goiter. Respiratory WNL. No retractions.. Cardiovascular Pedal Pulses Weak RLE. No clubbing, cyanosis or edema. Psychiatric Judgement and insight Intact.. No evidence of depression, anxiety, or agitation.. General Notes: There is a new eschar on the right fourth toe dorsum and there is no open ulceration. The rest of the right foot ulcerations have minimal slough which is sharply debrided. Integumentary (Hair, Skin) No suspicious lesions. No crepitus or fluctuance. No peri-wound warmth or erythema. No masses.. Wound #1 status is Open. Original cause of wound was Gradually Appeared. The wound is located on  the [place], Aneira ([id number]) Right Calcaneous. The wound measures 1.8cm length x 2cm width x 0.3cm depth; 2.827cm^2 area and 0.848cm^3 volume. Wound #2 status is Open. Original cause of wound was Gradually Appeared. The wound is located on the Right Toe Third. The wound measures 0.7cm length x 0.9cm width x 0.1cm depth; 0.495cm^2 area and 0.049cm^3 volume. Wound #3 status is Open. Original cause of wound was Surgical Injury. The wound is located on the [name] Amputation Site - Below Knee. The wound measures 0.9cm length x 5.5cm width x 0.9cm depth; 3.888cm^2 area and 3.499cm^3 volume. Wound #4 status is Open. Original cause of wound was Gradually Appeared. The wound is located on the Right,Medial Toe Third. The wound measures 0.2cm length x 0.3cm width x 0.1cm depth; 0.047cm^2 area and 0.005cm^3 volume. Wound #5 status is Open. Original cause of wound was Gradually Appeared. The wound is located on the Right,Plantar Toe Fourth. The wound measures 0.5cm length x 0.4cm width x 0.1cm depth; 0.157cm^2 area and 0.016cm^3 volume. The wound is limited to skin breakdown. There is a none present amount of drainage noted. The wound margin is indistinct and nonvisible. There is no granulation within the wound bed. There is a large (67-100%) amount of necrotic tissue within the wound bed including Eschar. The periwound skin appearance exhibited: Dry/Scaly. The periwound skin appearance did not exhibit: Callus, Crepitus, Excoriation, Fluctuance, Friable, Induration, Localized Edema, Rash, Scarring, Maceration, Moist, Atrophie Blanche, Cyanosis, Ecchymosis, Hemosiderin Staining, Mottled, Pallor, Rubor, Erythema. 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 The new ulceration on the dorsum of the fourth toe can be padded lightly and no Santyl to be applied there. She has a vascular procedure next week so she will change the day she will see me. 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 There was a Skin/Subcutaneous Tissue Debridement ([id number]) debridement with total area of 3.6 sq cm performed by [name] Schroeder., MD. to remove Viable and Non-Viable tissue/material including Fibrin/Slough and Subcutaneous after achieving pain control using Lidocaine 5% topical ointment. A time out was conducted prior to the start of the procedure. A Minimum amount of bleeding was controlled with Pressure. The procedure was  tolerated well with a pain level of 2 throughout and a pain level of 0 following the procedure. Post Debridement Measurements: 1.8cm length x 2cm width x 0.3cm depth; 0.848cm^3 volume. 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 There was a Debridement ([id number]) debridement performed by [name] Schroeder., MD.. Wound #3 Wound #3 is a Dehisced Wound located on the [name] Amputation Site - Below Knee . There was a Skin/Subcutaneous Tissue Debridement ([id number]) debridement with total area of 4.95 sq cm performed by [name] Schroeder., MD. with the following instrument(s): Curette to remove Viable and Non-Viable tissue/material including Fibrin/Slough and Subcutaneous after achieving pain control using Lidocaine 5% topical ointment. A time out was conducted prior to the start of the procedure. A  Minimum amount of bleeding was controlled with Pressure. The procedure was tolerated well with a pain level of 2 throughout and a pain level of 0 following the procedure. Post Debridement Measurements: 0.9cm length x 5.5cm width x 0.9cm depth; 3.499cm^3 volume. Plan Wound Cleansing: Wound #1 Right Calcaneous: Clean wound with Normal Saline. Wound #2 Right Toe Third: Clean wound with Normal Saline. Wound #3 [name] Amputation Site - Below Knee: Clean wound with Normal Saline. Wound #4 Right,Medial Toe Third: Clean wound with Normal Saline. Wound #5 Right,Plantar Toe Fourth: Clean wound with Normal Saline. Anesthetic: Wound #1 Right Calcaneous: Topical Lidocaine 4% cream applied to wound bed prior to debridement Wound #2 Right Toe Third: Topical Lidocaine 4% cream applied to wound bed prior to debridement Wound #3 [name] Amputation Site - Below Knee: Topical Lidocaine 4% cream applied to wound bed prior to debridement [name], [name] ([id number]) Wound #4 Right,Medial Toe Third: Topical Lidocaine 4% cream applied to wound bed prior to debridement Wound #5 Right,Plantar Toe Fourth: Topical Lidocaine 4% cream applied to wound bed prior to debridement Primary Wound Dressing: Wound #1 Right Calcaneous: Santyl Ointment Wound #2 Right Toe Third: Santyl Ointment Wound #3 [name] Amputation Site - Below Knee: Santyl Ointment Wound #4 Right,Medial Toe Third: Santyl Ointment Wound #5 Right,Plantar Toe Fourth: Santyl Ointment Secondary Dressing: Wound #1 Right Calcaneous: Gauze and Kerlix/Conform Wound #2 Right Toe Third: Gauze and Kerlix/Conform Wound #4 Right,Medial Toe Third: Gauze and Kerlix/Conform Wound #5 Right,Plantar Toe Fourth: Gauze and Kerlix/Conform Wound #3 [name] Amputation Site - Below Knee: Boardered Foam Dressing Dressing Change Frequency: Wound #1 Right Calcaneous: Change dressing every day. Wound #2 Right Toe Third: Change dressing every day. Wound #4 Right,Medial  Toe Third: Change dressing every day. Wound #5 Right,Plantar Toe Fourth: Change dressing every day. Wound #3 [name] Amputation Site - Below Knee: Change dressing every day. Follow-up Appointments: Wound #1 Right Calcaneous: Return Appointment in 1 week. Wound #2 Right Toe Third: Return Appointment in 1 week. Wound #3 [name] Amputation Site - Below Knee: Return Appointment in 1 week. Wound #4 Right,Medial Toe Third: Return Appointment in 1 week. Home Health: Wound #1 Right Calcaneous: Continue Home Health Visits Home Health Nurse may visit PRN to address patient s wound care needs. FACE TO FACE ENCOUNTER: MEDICARE and MEDICAID PATIENTS: I certify that this patient is under [name], [name] ([id number]) my care and that I had a face-to-face encounter that meets the physician face-to-face encounter requirements with this patient on this date. The encounter with the patient was in whole or in part for the following MEDICAL CONDITION: (primary reason for Home Healthcare) MEDICAL NECESSITY: I certify, that based on my findings, NURSING services are a medically necessary home health service.  HOME BOUND STATUS: I certify that my clinical findings support that this patient is homebound (i.e., Due to illness or injury, pt requires aid of supportive devices such as crutches, cane, wheelchairs, walkers, the use of special transportation or the assistance of another person to leave their place of residence. There is a normal inability to leave the home and doing so requires considerable and taxing effort. Other absences are for medical reasons / religious services and are infrequent or of short duration when for other reasons). If current dressing causes regression in wound condition, may D/C ordered dressing product/s and apply Normal Saline Moist Dressing daily until next Wound Healing Center / Other MD appointment. Notify Wound Healing Center of regression in wound condition at [phone]. Please  direct any NON-WOUND related issues/requests for orders to patient's Primary Care Physician Wound #2 Right Toe Third: Continue Home Health Visits Home Health Nurse may visit PRN to address patient s wound care needs. FACE TO FACE ENCOUNTER: MEDICARE and MEDICAID PATIENTS: I certify that this patient is under my care and that I had a face-to-face encounter that meets the physician face-to-face encounter requirements with this patient on this date. The encounter with the patient was in whole or in part for the following MEDICAL CONDITION: (primary reason for Home Healthcare) MEDICAL NECESSITY: I certify, that based on my findings, NURSING services are a medically necessary home health service. HOME BOUND STATUS: I certify that my clinical findings support that this patient is homebound (i.e., Due to illness or injury, pt requires aid of supportive devices such as crutches, cane, wheelchairs, walkers, the use of special transportation or the assistance of another person to leave their place of residence. There is a normal inability to leave the home and doing so requires considerable and taxing effort. Other absences are for medical reasons / religious services and are infrequent or of short duration when for other reasons). If current dressing causes regression in wound condition, may D/C ordered dressing product/s and apply Normal Saline Moist Dressing daily until next Wound Healing Center / Other MD appointment. Notify Wound Healing Center of regression in wound condition at [phone]. Please direct any NON-WOUND related issues/requests for orders to patient's Primary Care Physician Wound #3 [name] Amputation Site - Below Knee: Continue Home Health Visits Home Health Nurse may visit PRN to address patient s wound care needs. FACE TO FACE ENCOUNTER: MEDICARE and MEDICAID PATIENTS: I certify that this patient is under my care and that I had a face-to-face encounter that meets the physician  face-to-face encounter requirements with this patient on this date. The encounter with the patient was in whole or in part for the following MEDICAL CONDITION: (primary reason for Home Healthcare) MEDICAL NECESSITY: I certify, that based on my findings, NURSING services are a medically necessary home health service. HOME BOUND STATUS: I certify that my clinical findings support that this patient is homebound (i.e., Due to illness or injury, pt requires aid of supportive devices such as crutches, cane, wheelchairs, walkers, the use of special transportation or the assistance of another person to leave their place of residence. There is a normal inability to leave the home and doing so requires considerable and taxing effort. Other absences are for medical reasons / religious services and are infrequent or of short duration when for other reasons). If current dressing causes regression in wound condition, may D/C ordered dressing product/s and apply Normal Saline Moist Dressing daily until next Wound Healing Center / Other MD appointment. Notify Wound Healing  Center of regression in wound condition at [phone]. Please direct any NON-WOUND related issues/requests for orders to patient's Primary Care Physician Wound #4 Right,Medial Toe Third: Continue Home Health Visits Home Health Nurse may visit PRN to address patient s wound care needs. FACE TO FACE ENCOUNTER: MEDICARE and MEDICAID PATIENTS: I certify that this patient is under [name], [name] ([id number]) my care and that I had a face-to-face encounter that meets the physician face-to-face encounter requirements with this patient on this date. The encounter with the patient was in whole or in part for the following MEDICAL CONDITION: (primary reason for Home Healthcare) MEDICAL NECESSITY: I certify, that based on my findings, NURSING services are a medically necessary home health service. HOME BOUND STATUS: I certify that my clinical  findings support that this patient is homebound (i.e., Due to illness or injury, pt requires aid of supportive devices such as crutches, cane, wheelchairs, walkers, the use of special transportation or the assistance of another person to leave their place of residence. There is a normal inability to leave the home and doing so requires considerable and taxing effort. Other absences are for medical reasons / religious services and are infrequent or of short duration when for other reasons). If current dressing causes regression in wound condition, may D/C ordered dressing product/s and apply Normal Saline Moist Dressing daily until next Wound Healing Center / Other MD appointment. Notify Wound Healing Center of regression in wound condition at [phone]. Please direct any NON-WOUND related issues/requests for orders to patient's Primary Care Physician Continue light packing of the amputation stump on the [name] side and Santyl on the right lower extremity. The new ulceration on the dorsum of the fourth toe can be padded lightly and no Santyl to be applied there. She has a vascular procedure next week so she will change the day she will see me. Electronic Signature(s) Signed: 04/07/2015 12:36:25 PM By: [name] Kanner MD, FACS Previous Signature: 04/01/2015 12:24:54 PM Version By: [name] Kanner MD, FACS Entered By: [name] on 04/07/2015 12:36:25 [name] ([id number]) -------------------------------------------------------------------------------- SuperBill Details Patient Name: [name] Date of Service: 04/01/2015 Medical Record Number: [id number] Patient Account Number: [id number] Date of Birth/Sex: Feb 13, 1921 (81 y.o. 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 L97.812 Non-pressure chronic ulcer of other part of right lower leg with fat layer exposed Disruption of external operation (surgical) wound, not elsewhere classified, initial T81.31XA encounter Facility Procedures CPT4: Description Modifier Quantity Code [id number] 11042 - DEB SUBQ TISSUE 20 SQ CM/< 1 ICD-10 Description Diagnosis E11.621 Type 2 diabetes mellitus with foot ulcer I70.235 Atherosclerosis of native arteries of right leg with ulceration of other part of  foot 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 L97.812 Non-pressure chronic ulcer of other part of right lower leg with fat layer exposed Physician Procedures CPT4: Description Modifier Quantity Code [id number] 11042 - WC PHYS SUBQ TISS 20 SQ CM 1 ICD-10 Description Diagnosis E11.621 Type 2 diabetes mellitus with foot ulcer I70.235 Atherosclerosis of native arteries of right leg with ulceration of other part of  foot L97.412 Non-pressure chronic ulcer of right heel  and midfoot with fat layer exposed L97.812 Non-pressure chronic ulcer of other part of right lower leg with fat layer exposed [name], [name] ([id number]) Electronic Signature(s) Signed: 04/01/2015 12:24:54 PM By: [name] Kanner MD, FACS Entered By: [name] on 04/01/2015 11:18:28

## 2015-04-08 ENCOUNTER — Encounter: Payer: Self-pay | Admitting: *Deleted

## 2015-04-08 ENCOUNTER — Encounter
Admission: RE | Disposition: A | Discharge status: Home / Self Care | Payer: Self-pay | Source: Ambulatory Visit | Attending: Vascular Surgery

## 2015-04-08 ENCOUNTER — Ambulatory Visit
Admission: RE | Admit: 2015-04-08 | Discharge: 2015-04-08 | Disposition: A | Discharge status: Home / Self Care | Payer: Medicare Other | Source: Ambulatory Visit | Attending: Vascular Surgery | Admitting: Vascular Surgery

## 2015-04-08 DIAGNOSIS — Z89512 Acquired absence of left leg below knee: Secondary | ICD-10-CM | POA: Insufficient documentation

## 2015-04-08 DIAGNOSIS — Z87891 Personal history of nicotine dependence: Secondary | ICD-10-CM | POA: Insufficient documentation

## 2015-04-08 DIAGNOSIS — E785 Hyperlipidemia, unspecified: Secondary | ICD-10-CM | POA: Insufficient documentation

## 2015-04-08 DIAGNOSIS — E669 Obesity, unspecified: Secondary | ICD-10-CM | POA: Diagnosis not present

## 2015-04-08 DIAGNOSIS — I890 Lymphedema, not elsewhere classified: Secondary | ICD-10-CM | POA: Diagnosis not present

## 2015-04-08 DIAGNOSIS — Z86718 Personal history of other venous thrombosis and embolism: Secondary | ICD-10-CM | POA: Diagnosis not present

## 2015-04-08 DIAGNOSIS — I70235 Atherosclerosis of native arteries of right leg with ulceration of other part of foot: Secondary | ICD-10-CM | POA: Diagnosis present

## 2015-04-08 DIAGNOSIS — E119 Type 2 diabetes mellitus without complications: Secondary | ICD-10-CM | POA: Diagnosis not present

## 2015-04-08 HISTORY — DX: Essential (primary) hypertension: I10

## 2015-04-08 HISTORY — DX: Cardiac arrhythmia, unspecified: I49.9

## 2015-04-08 HISTORY — DX: Peripheral vascular disease, unspecified: I73.9

## 2015-04-08 HISTORY — PX: PERIPHERAL VASCULAR CATHETERIZATION: SHX172C

## 2015-04-08 HISTORY — DX: Type 2 diabetes mellitus without complications: E11.9

## 2015-04-08 LAB — BASIC METABOLIC PANEL
Anion gap: 6 (ref 5–15)
BUN: 24 mg/dL — ABNORMAL HIGH (ref 6–20)
CHLORIDE: 104 mmol/L (ref 101–111)
CO2: 31 mmol/L (ref 22–32)
Calcium: 9 mg/dL (ref 8.9–10.3)
Creatinine, Ser: 0.72 mg/dL (ref 0.44–1.00)
GFR calc Af Amer: >60 mL/min (ref >60)
GFR calc non Af Amer: >60 mL/min (ref >60)
GLUCOSE: 91 mg/dL (ref 65–99)
Potassium: 4.1 mmol/L (ref 3.5–5.1)
SODIUM: 141 mmol/L (ref 135–145)

## 2015-04-08 SURGERY — LOWER EXTREMITY ANGIOGRAPHY
Anesthesia: Moderate Sedation | Laterality: Right

## 2015-04-08 MED ORDER — HEPARIN SODIUM (PORCINE) 1000 UNIT/ML IJ SOLN
INTRAMUSCULAR | Status: AC
  Filled 2015-04-08: qty 1

## 2015-04-08 MED ORDER — CLINDAMYCIN PHOSPHATE 300 MG/50ML IV SOLN
300.0000 mg | Freq: Once | INTRAVENOUS | Status: AC
  Administered 2015-04-08: 300 mg via INTRAVENOUS

## 2015-04-08 MED ORDER — FENTANYL CITRATE (PF) 100 MCG/2ML IJ SOLN
INTRAMUSCULAR | Status: DC | PRN
  Administered 2015-04-08 (×2): 50 ug via INTRAVENOUS

## 2015-04-08 MED ORDER — HYDROMORPHONE HCL 1 MG/ML IJ SOLN
INTRAMUSCULAR | Status: AC
  Administered 2015-04-08: 1 mg
  Filled 2015-04-08: qty 1

## 2015-04-08 MED ORDER — CEFAZOLIN SODIUM 1-5 GM-% IV SOLN
INTRAVENOUS | Status: AC
  Filled 2015-04-08: qty 50

## 2015-04-08 MED ORDER — HEPARIN (PORCINE) IN NACL 2-0.9 UNIT/ML-% IJ SOLN
INTRAMUSCULAR | Status: AC
  Filled 2015-04-08: qty 1000

## 2015-04-08 MED ORDER — HEPARIN SODIUM (PORCINE) 1000 UNIT/ML IJ SOLN
INTRAMUSCULAR | Status: DC | PRN
  Administered 2015-04-08: 5000 [IU] via INTRAVENOUS

## 2015-04-08 MED ORDER — LIDOCAINE HCL (PF) 1 % IJ SOLN
INTRAMUSCULAR | Status: DC | PRN
  Administered 2015-04-08: 5 mL via INTRADERMAL

## 2015-04-08 MED ORDER — FENTANYL CITRATE (PF) 100 MCG/2ML IJ SOLN
INTRAMUSCULAR | Status: AC
  Filled 2015-04-08: qty 2

## 2015-04-08 MED ORDER — MIDAZOLAM HCL 5 MG/5ML IJ SOLN
INTRAMUSCULAR | Status: AC
  Filled 2015-04-08: qty 5

## 2015-04-08 MED ORDER — LIDOCAINE HCL (PF) 1 % IJ SOLN
INTRAMUSCULAR | Status: AC
  Filled 2015-04-08: qty 10

## 2015-04-08 MED ORDER — CEFAZOLIN SODIUM 1-5 GM-% IV SOLN: 1.0000 g | Freq: Once | INTRAVENOUS | Status: DC

## 2015-04-08 MED ORDER — CLINDAMYCIN PHOSPHATE 300 MG/50ML IV SOLN
INTRAVENOUS | Status: AC
  Filled 2015-04-08: qty 50

## 2015-04-08 MED ORDER — SODIUM CHLORIDE 0.9 % IV SOLN
INTRAVENOUS | Status: DC
  Administered 2015-04-08: 08:00:00 via INTRAVENOUS

## 2015-04-08 MED ORDER — MIDAZOLAM HCL 2 MG/2ML IJ SOLN
INTRAMUSCULAR | Status: DC | PRN
  Administered 2015-04-08: 1 mg via INTRAVENOUS
  Administered 2015-04-08: 2 mg via INTRAVENOUS

## 2015-04-08 SURGICAL SUPPLY — 22 items
BALLN ARMADA 2X80X150 (BALLOONS) ×4 IMPLANT
BALLN ARMADA 3.0X60X150 (BALLOONS) ×2
BALLN ARMADA 3X60X150 (BALLOONS) ×2
BALLN LUTONIX DCB 6X40X130 (BALLOONS) ×4
BALLN ULTRVRSE 2.5X300X150 (BALLOONS) ×4
BALLOON ARMADA 3X60X150 (BALLOONS) ×2 IMPLANT
BALLOON LUTONIX DCB 6X40X130 (BALLOONS) ×2 IMPLANT
BALLOON ULTRVRSE 2.5X300X150 (BALLOONS) ×2 IMPLANT
CATH 4F PTAIL BEACON TIP (CATHETERS) ×4 IMPLANT
CATH CXI SUPP ANG 2.6FR 150CM (MICROCATHETER) ×4 IMPLANT
CATH ROYAL FLUSH PIG 5F 70CM (CATHETERS) ×4 IMPLANT
DEVICE STARCLOSE SE CLOSURE (Vascular Products) ×360 IMPLANT
GLIDECATH ANGLED 4FR 120CM (CATHETERS) ×4 IMPLANT
GLIDEWIRE ANGLED SS 035X260CM (WIRE) ×4 IMPLANT
GUIDEWIRE PFTE-COATED .018X300 (WIRE) ×4 IMPLANT
PACK ANGIOGRAPHY (CUSTOM PROCEDURE TRAY) ×4 IMPLANT
SET INTRO CAPELLA COAXIAL (SET/KITS/TRAYS/PACK) ×4 IMPLANT
SHEATH BRITE TIP 5FRX11 (SHEATH) ×4 IMPLANT
SHEATH RAABE 6FR (SHEATH) ×4 IMPLANT
SYR MEDRAD MARK V 150ML (SYRINGE) ×4 IMPLANT
TUBING CONTRAST HIGH PRESS 72 (TUBING) ×4 IMPLANT
WIRE J 3MM .035X145CM (WIRE) ×4 IMPLANT

## 2015-04-08 NOTE — Op Note (Addendum)
OPERATIVE NOTE   PROCEDURE: 1. Right lower extremity angiography third order catheter placement 2. Ultrasound-guided access left common femoral artery for sheath placement 3. Percutaneous transluminal angioplasty of the dorsalis pedis artery to 2 mm 4. Percutaneous transluminal angioplasty of the anterior tibial artery to 2.5 mm distally and 3 mm at its origin. 5. Percutaneous transluminal and plasty of the superficial femoral artery in its midportion to 6 mm using a Lutonix balloon  PRE-OPERATIVE DIAGNOSIS: Atherosclerotic occlusive disease bilateral lower extremities with ulceration of the right foot.  POST-OPERATIVE DIAGNOSIS: Same  SURGEON: Katha Cabal, M.D. ASSISTANT(S): None  ANESTHESIA: IV sedation  ESTIMATED BLOOD LOSS: Minimal cc  FINDING(S): 1.  Diffuse atherosclerotic changes of the right lower extremity  SPECIMEN(S):  None  INDICATIONS:   [name] is a 81 y.o. y.o. female who presents with signs of sepsis secondary to infection of the right heel ulcer. She has nonpalpable pulses in association with the heel ulcer. The heel ulcer has not been healing in spite of appropriate wound care.  DESCRIPTION: After obtaining full informed written consent, the patient was brought back to the operating room and placed supine upon the operating table.  The patient received IV antibiotics prior to induction.  After obtaining adequate sedation, the patient was prepped and draped in the standard fashion and appropriate time out is called.    Ultrasound is placed in a sterile sleeve ultrasound as utilized secondary to lack of appropriate landmarks and to avoid vascular injury. Under real-time visualization the left common femoral artery is identified is echolucent and pulsatile indicating patency. Image is recorded for the permanent record. 1% lidocaine is then infiltrated into the soft tissues with ultrasound visualization. Microneedle is then inserted into the anterior wall  of the common femoral artery under direct visualization with ultrasound. Microwire followed by micro-sheath.   J-wire followed by 6 French sheath is then inserted without difficulty.  The pigtail catheter was positioned to above the bifurcation and oblique view of the pelvis is obtained.  Using a Stiff angled Glide Wire wire and the pigtail catheter the aortic bifurcation was crossed and the catheter is advanced down to the external iliac artery. Oblique view of the femoral bifurcation is then obtained by hand injection. The wire is then reintroduced and the catheter is positioned within the SFA. AP projections of the right lower extremity are obtained for distal runoff.  After review the images distal images are not adequate and the wire is reintroduced a 6 [place] Rabi sheath is advanced up and over the bifurcation and positioned with its tip in the superficial femoral artery and subsequently a 125 cm straight slip catheter is advanced down to the distal popliteal. Further imaging of the distal popliteal and tibial vessels is then obtained.  5000 units of heparin was then given and allowed to circulate for approximately 4 minutes. A 018 advantage wire is then negotiated down to the level of the ankle through the anterior tibial artery. CX I catheter is advanced over the wire and magnified imaging of the dorsalis pedis artery is then obtained. The advantage wire is then negotiated out to the level of the pedal arch across the dorsalis pedis and a 2 mm x 10 cm balloon is advanced across the dorsalis pedis and inflated to 8 atm for 1 full minute. Next, a 2.5 x 30 balloon is positioned with its distal marker at the level of the ankle and inflation is to 10 atm for 1 full minute. Finally, a 3 x  6 balloon is positioned across the origin and the first 4 cm of the anterior tibial inflation is to 10 atm for 1 full minute.  The detectors then repositioned to the mid SFA hand injection contrast is used to localize  the lesion within the mid SFA and a 6 x 4 Lutonix balloon is positioned across this lesion and inflated to 12 atm for 3 minutes.  Follow-up imaging is then obtained beginning just above the level of the SFA intervention and serial imaging is performed all the way to the foot. After review these images the catheter is removed sheath is pulled back into the left iliac system J-wire is then advanced and socially and LAO projection of the groin is obtained after review of this image a Star Cose device is deployed without difficulty.  INTERPRETATION: The distal abdominal aorta is opacified with a bolus injection of contrast. There is diffuse atherosclerotic changes clearly visible even under fluoroscopy without contrast enhancement. However, there are no hemodynamically significant lesions noted within the aorta, bilateral common iliac arteries and bilateral external iliac arteries.  The right common femoral and profunda femoris arteries are widely patent. The right superficial femoral artery demonstrates diffuse disease as does the popliteal, there is a focal 70% narrowing within the midportion of the SFA just proximal to Hunter's canal but there are no hemodynamically significant lesions noted in the popliteal. Of note the popliteal artery was imaged in its entirety by flexing the knee and taking a steep oblique so that the knee prosthesis could be removed from the field. The trifurcation is heavily diseased with a string sign noted at the origin of the anterior tibial extending for the first 2-3 cm. The peroneal appears to be patent but is quite small. The posterior tibial is occluded throughout its entire course demonstrating only isolated segments periodically. There is no filling of the lateral plantar. The anterior tibial artery is the dominant runoff to the foot and has several lesions noted within its midportion and distal one third. The dorsalis pedis also has multiple lesions within it but appears to be  the dominant filling to the pedal arch.   Interventions are as follows; the dorsalis pedis is treated with a 2 mm balloon inflation the anterior tibial in its distal two thirds is treated with a 2.5 mm inflation and the proximal portion of the anterior tibial is treated with a 3 mm inflation. Follow-up imaging demonstrates successful antroplasty's all locations without significant residual stenosis and no evidence of dissection. The superficial femoral artery is treated with its midportion with a 6 mm balloon there is a small dissection but this is not flow limiting and therefore stenting is not indicated. Distal runoff is preserved on final imaging when compared to the pre-intervention images.  Successful intervention with recanalization and in-line flow to the right foot.   COMPLICATIONS: There were no immediate consultations.  CONDITION: Unchanged and satisfactory  Schnier, Dolores Lory, M.D. [facility] Office: [phone]   04/08/2015,9:36 AM

## 2015-04-08 NOTE — H&P (Signed)
[facility] History & Physical Update  The patient was interviewed and re-examined.  The patient's previous History and Physical has been reviewed and is unchanged.  There is no change in the plan of care. We plan to proceed with the scheduled procedure.  Rita Prom, Latina CraverGregory G, MD  04/08/2015, 8:08 AM

## 2015-04-08 NOTE — Discharge Instructions (Signed)

## 2015-04-09 ENCOUNTER — Encounter: Payer: Self-pay | Admitting: Vascular Surgery

## 2015-04-10 ENCOUNTER — Encounter: Payer: Medicare Other | Admitting: Surgery

## 2015-04-10 DIAGNOSIS — L97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: Secondary | ICD-10-CM | POA: Diagnosis not present

## 2015-04-10 NOTE — Progress Notes (Addendum)
[name], [name] ([id number]) Visit Report for 04/10/2015 Chief Complaint Document Details Patient Name: [name], [name] Date of Service: 04/10/2015 10:15 AM Medical Record Number: [id number] Patient Account Number: [id number] Date of Birth/Sex: [birth year] (81 y.o. Female) Treating RN: Primary Care Physician: Terance Hart, DAVID Other Clinician: Referring Physician: Terance Hart, DAVID Treating Physician/Extender: Rudene Re in Treatment: 8 Information Obtained from: Patient Chief Complaint Patient presents to the wound care center for a consult due non healing wound. 81 year old patient with comes with a history of a ulcerated area to the right third toe and the right heel which she's had for about 2 months. Electronic Signature(s) Signed: 04/10/2015 11:50:39 AM By: Evlyn Kanner MD, FACS Entered By: Evlyn Kanner on 04/10/2015 11:50:39 [name] ([id number]) -------------------------------------------------------------------------------- Debridement Details Patient Name: [name] Date of Service: 04/10/2015 10:15 AM Medical Record Number: [id number] Patient Account Number: [id number] Date of Birth/Sex: 1920-12-23 (81 y.o. Female) Treating RN: Primary Care Physician: Terance Hart, DAVID Other Clinician: Referring Physician: Terance Hart, DAVID Treating Physician/Extender: Rudene Re in Treatment: 8 Debridement Performed for Wound #1 Right Calcaneous Assessment: Performed By: Physician Tristan Schroeder., MD Debridement: Debridement Pre-procedure Yes Verification/Time Out Taken: Start Time: 11:09 Pain Control: Lidocaine 4% Topical Solution Level: Skin/Subcutaneous Tissue Total Area Debrided (L x 1.8 (cm) x 2 (cm) = 3.6 (cm) W): Tissue and other Viable, Non-Viable, Fibrin/Slough, Subcutaneous material debrided: Instrument: Curette Bleeding: Minimum Hemostasis Achieved: Pressure End Time: 11:11 Procedural Pain: 0 Post Procedural Pain: 0 Response to Treatment:  Procedure was tolerated well Post Debridement Measurements of Total Wound Length: (cm) 1.8 Width: (cm) 2 Depth: (cm) 0.2 Volume: (cm) 0.565 Electronic Signature(s) Signed: 04/10/2015 11:49:28 AM By: Evlyn Kanner MD, FACS Entered By: Evlyn Kanner on 04/10/2015 11:49:27 [name] ([id number]) -------------------------------------------------------------------------------- Debridement Details Patient Name: [name] Date of Service: 04/10/2015 10:15 AM Medical Record Number: [id number] Patient Account Number: [id number] Date of Birth/Sex: Sep 16, 1921 (81 y.o. Female) Treating RN: Primary Care Physician: Terance Hart, DAVID Other Clinician: Referring Physician: Terance Hart, DAVID Treating Physician/Extender: Rudene Re in Treatment: 8 Debridement Performed for Wound #2 Right Toe Third Assessment: Performed By: Physician Tristan Schroeder., MD Debridement: Debridement Pre-procedure Yes Verification/Time Out Taken: Start Time: 11:08 Pain Control: Lidocaine 4% Topical Solution Level: Skin/Subcutaneous Tissue Total Area Debrided (L x 1 (cm) x 1 (cm) = 1 (cm) W): Tissue and other Viable, Non-Viable, Fibrin/Slough, Subcutaneous material debrided: Instrument: Curette Bleeding: Minimum Hemostasis Achieved: Pressure End Time: 11:09 Procedural Pain: 0 Post Procedural Pain: 0 Response to Treatment: Procedure was tolerated well Post Debridement Measurements of Total Wound Length: (cm) 1 Width: (cm) 1 Depth: (cm) 0.1 Volume: (cm) 0.079 Electronic Signature(s) Signed: 04/10/2015 11:49:52 AM By: Evlyn Kanner MD, FACS Entered By: Evlyn Kanner on 04/10/2015 11:49:51 [name] ([id number]) -------------------------------------------------------------------------------- Debridement Details Patient Name: [name] Date of Service: 04/10/2015 10:15 AM Medical Record Number: [id number] Patient Account Number: [id number] Date of Birth/Sex: Dec 05, 1920 (81 y.o.  Female) Treating RN: Primary Care Physician: Terance Hart, DAVID Other Clinician: Referring Physician: Terance Hart, DAVID Treating Physician/Extender: Rudene Re in Treatment: 8 Debridement Performed for Wound #3 [name] Amputation Site - Below Knee Assessment: Performed By: Physician Tristan Schroeder., MD Debridement: Debridement Pre-procedure Yes Verification/Time Out Taken: Start Time: 11:05 Pain Control: Lidocaine 4% Topical Solution Level: Skin/Subcutaneous Tissue Total Area Debrided (L x 1.3 (cm) x 5 (cm) = 6.5 (cm) W): Tissue and other Viable, Non-Viable, Fibrin/Slough, Subcutaneous material debrided: Instrument: Curette Bleeding: Minimum Hemostasis Achieved: Pressure End Time: 11:08 Procedural Pain: 0 Post Procedural Pain: 0 Response to Treatment: Procedure was tolerated well  Post Debridement Measurements of Total Wound Length: (cm) 1.3 Width: (cm) 5.2 Depth: (cm) 0.7 Volume: (cm) 3.717 Electronic Signature(s) Signed: 04/10/2015 11:50:31 AM By: Evlyn Kanner MD, FACS Entered By: Evlyn Kanner on 04/10/2015 11:50:31 [name] ([id number]) -------------------------------------------------------------------------------- HPI Details Patient Name: [name] Date of Service: 04/10/2015 10:15 AM Medical Record Number: [id number] Patient Account Number: [id number] Date of Birth/Sex: 1921-04-16 (81 y.o. Female) Treating RN: Primary Care Physician: Terance Hart, DAVID Other Clinician: Referring Physician: Terance Hart, DAVID Treating Physician/Extender: Rudene Re in Treatment: 8 History of Present Illness Location: right medial heel and right third toe Quality: Patient reports experiencing a dull pain to affected area(s). Severity: Patient states wound are getting worse. Duration: Patient has had the wound for > 3 months prior to seeking treatment at the wound center Timing: Pain in wound is Intermittent (comes and goes Context: The wound appeared  gradually over time Modifying Factors: Consults to this date include:vascular surgeon and several recent surgeries. Associated Signs and Symptoms: Patient reports having foul odor and drainage from the [name]. HPI Description: A pleasant 81 year old patient who is known to have diabetes mellitus for several years has recently gone through a series of operations with the vascular surgeon Dr. Gilda Crease. In January and February she had several surgeries on [name]. 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 On 01/07/2015 she also had a right lower extremity distal runoff with a angioplasty of the right anterior tibial artery to maximize her blood flow to the foot. She recently had her staples removed at Dr. Marijean Heath office on 01/31/2015 and at that time a right lower extremity duplex was done which showed patent vessels and a patent stent with distal occluded posterior tibial artery. Though the duplex was noncritical the recommendations from the PA at the vascular surgery office was that of a angiogram to be done but the patient said she would rather try some bone care before. The patient has received doxycycline and Cipro in the recent past and she takes oral medications for her diabetes. Other than that I reviewed her list of all her medications. No recent hemoglobin A1c has been done and no recent x-rays of the right foot have been done. 02/18/2015 -- x-ray of the right foot was done on 02/11/2015 and it shows no evidence of acute osteomyelitis of the third toe or of the calcaneus. She had gone to the vascular surgery office and they had noted that there is dehiscence of [name] of the below-knee wound and they have asked [place] to kindly take over the care of this. There've been  doing dressings for the right heel and right third toe. 02/25/2015 -- we have received notes from the vascular group who saw her last on 02/13/2015 and she was seen by the PA Ms. Cleda Daub. She had recommended that the patient continue to follow with [place] for wound care including management of [name], which has had dehiscence of the lateral part. They will consider repeating a arterial duplex or angiogram if the right lower extremity does not heal within a reasonable period of time. 03/18/2015 -- he saw the vascular surgeon Dr. Gilda Crease and he has set her up for an angioplasty sometime in the middle of July. she is doing well otherwise. [name], [name] ([id number]) 04/10/2015 -- the patient  had a procedure done on 04/08/2015 and this was a right angioplasty of the dorsalis pedis, anterior tibial artery, first superficial femoral artery in its midportion. There was successful intervention with recanalization and in-line flow to the right foot. Electronic Signature(s) Signed: 04/10/2015 11:50:48 AM By: Evlyn Kanner MD, FACS Previous Signature: 04/10/2015 10:31:52 AM Version By: Evlyn Kanner MD, FACS Entered By: Evlyn Kanner on 04/10/2015 11:50:48 [name] ([id number]) -------------------------------------------------------------------------------- Physical Exam Details Patient Name: [name] Date of Service: 04/10/2015 10:15 AM Medical Record Number: [id number] Patient Account Number: [id number] Date of Birth/Sex: 03/25/1921 (81 y.o. Female) Treating RN: Primary Care Physician: Terance Hart, DAVID Other Clinician: Referring Physician: Terance Hart, DAVID Treating Physician/Extender: Rudene Re in Treatment: 8 Constitutional . Pulse regular. Respirations normal and unlabored. Afebrile. . Eyes Nonicteric. Reactive to light. Ears, Nose, Mouth, and Throat Lips, teeth, and gums WNL.Marland Kitchen Moist mucosa without lesions . Neck supple and nontender. No palpable  supraclavicular or cervical adenopathy. Normal sized without goiter. Respiratory WNL. No retractions.. Cardiovascular Pedal Pulses WNL. she has got significant edema of both lower extremities and also the [name] below-knee stump.. Musculoskeletal Adexa without tenderness or enlargement.. Digits and nails w/o clubbing, cyanosis, infection, petechiae, ischemia, or inflammatory conditions.. Integumentary (Hair, Skin) No suspicious lesions. No crepitus or fluctuance. No peri-wound warmth or erythema. No masses.Marland Kitchen Psychiatric Judgement and insight Intact.. No evidence of depression, anxiety, or agitation.. Notes Sharp debridement is needed to be done on all the wounds and this is done with a #3 curette. Electronic Signature(s) Signed: 04/10/2015 11:51:56 AM By: Evlyn Kanner MD, FACS Entered By: Evlyn Kanner on 04/10/2015 11:51:55 [name] ([id number]) -------------------------------------------------------------------------------- Physician Orders Details Patient Name: [name] Date of Service: 04/10/2015 10:15 AM Medical Record Number: [id number] Patient Account Number: [id number] Date of Birth/Sex: 20-Sep-1921 (81 y.o. Female) Treating RN: Curtis Sites Primary Care Physician: Dorothey Baseman Other Clinician: Referring Physician: Dorothey Baseman Treating Physician/Extender: Rudene Re in Treatment: 8 Verbal / Phone Orders: Yes Clinician: Curtis Sites Read Back and Verified: Yes Diagnosis Coding Wound Cleansing Wound #1 Right Calcaneous o Clean wound with Normal Saline. Wound #2 Right Toe Third o Clean wound with Normal Saline. Wound #3 [name] Amputation Site - Below Knee o Clean wound with Normal Saline. Wound #4 Right,Medial Toe Third o Clean wound with Normal Saline. Wound #5 Right,Plantar Toe Fourth o Clean wound with Normal Saline. Anesthetic Wound #1 Right Calcaneous o Topical Lidocaine 4% cream applied to wound bed prior to  debridement Wound #2 Right Toe Third o Topical Lidocaine 4% cream applied to wound bed prior to debridement Wound #3 [name] Amputation Site - Below Knee o Topical Lidocaine 4% cream applied to wound bed prior to debridement Wound #4 Right,Medial Toe Third o Topical Lidocaine 4% cream applied to wound bed prior to debridement Wound #5 Right,Plantar Toe Fourth o Topical Lidocaine 4% cream applied to wound bed prior to debridement Primary Wound Dressing Wound #1 Right Calcaneous o Santyl Ointment Wound #2 Right Toe Third Muntean, Miel ([id number]) o Santyl Ointment Wound #3 [name] Amputation Site - Below Knee o Santyl Ointment o Plain packing gauze Wound #4 Right,Medial Toe Third o Santyl Ointment Wound #5 Right,Plantar Toe Fourth o Other: - lambs wool Secondary Dressing Wound #1 Right Calcaneous o Boardered Foam Dressing o XtraSorb - to weeping skin and over wound Wound #2 Right Toe Third o Gauze and Kerlix/Conform Wound #3 [name] Amputation Site - Below Knee o Boardered Foam Dressing o XtraSorb - to weeping skin and over wound Wound #4 Right,Medial Toe Third   o Gauze and Kerlix/Conform Wound #5 Right,Plantar Toe Fourth o Gauze and Kerlix/Conform Dressing Change Frequency Wound #1 Right Calcaneous o Change dressing every day. Wound #2 Right Toe Third o Change dressing every day. Wound #3 [name] Amputation Site - Below Knee o Change dressing every day. Wound #4 Right,Medial Toe Third o Change dressing every day. Wound #5 Right,Plantar Toe Fourth o Change dressing every day. Follow-up Appointments Wound #1 Right Calcaneous Madaris, Nyrie ([id number]) o Return Appointment [phone] 1 week. Wound #2 Right Toe Third o Return Appointment in 1 week. Wound #3 [name] Amputation Site - Below Knee o Return Appointment in 1 week. Wound #4 Right,Medial Toe Third o Return Appointment in 1 week. Wound #5 Right,Plantar Toe Fourth o Return  Appointment in 1 week. Home Health Wound #1 Right Calcaneous o Continue Home Health Visits - [facility] o Home Health Nurse may visit PRN to address patientos wound care needs. o FACE TO FACE ENCOUNTER: MEDICARE and MEDICAID PATIENTS: I certify that this patient is under my care and that I had a face-to-face encounter that meets the physician face-to-face encounter requirements with this patient on this date. The encounter with the patient was in whole or in part for the following MEDICAL CONDITION: (primary reason for Home Healthcare) MEDICAL NECESSITY: I certify, that based on my findings, NURSING services are a medically necessary home health service. HOME BOUND STATUS: I certify that my clinical findings support that this patient is homebound (i.e., Due to illness or injury, pt requires aid of supportive devices such as crutches, cane, wheelchairs, walkers, the use of special transportation or the assistance of another person to leave their place of residence. There is a normal inability to leave the home and doing so requires considerable and taxing effort. Other absences are for medical reasons / religious services and are infrequent or of short duration when for other reasons). o If current dressing causes regression in wound condition, may D/C ordered dressing product/s and apply Normal Saline Moist Dressing daily until next Wound Healing Center / Other MD appointment. Notify Wound Healing Center of regression in wound condition at [phone]. o Please direct any NON-WOUND related issues/requests for orders to patient's Primary Care Physician Wound #2 Right Toe Third o Continue Home Health Visits - [facility] o Home Health Nurse may visit PRN to address patientos wound care needs. o FACE TO FACE ENCOUNTER: MEDICARE and MEDICAID PATIENTS: I certify that this patient is under my care and that I had a face-to-face encounter that meets the physician face-to-face encounter  requirements with this patient on this date. The encounter with the patient was in whole or in part for the following MEDICAL CONDITION: (primary reason for Home Healthcare) MEDICAL NECESSITY: I certify, that based on my findings, NURSING services are a medically necessary home health service. HOME BOUND STATUS: I certify that my clinical findings support that this patient is homebound (i.e., Due to illness or injury, pt requires aid of supportive devices such as crutches, cane, wheelchairs, walkers, the use of special transportation or the assistance of another person to leave their place of residence. There is a normal inability to leave the home and doing so requires considerable and taxing effort. Domingo PulseOther Varon, Danniela ([id number]) absences are for medical reasons / religious services and are infrequent or of short duration when for other reasons). o If current dressing causes regression in wound condition, may D/C ordered dressing product/s and apply Normal Saline Moist Dressing daily until next Wound Healing Center / Other MD appointment. Notify  Wound Healing Center of regression in wound condition at [phone]. o Please direct any NON-WOUND related issues/requests for orders to patient's Primary Care Physician Wound #3 [name] Amputation Site - Below Knee o Continue Home Health Visits - [facility] o Home Health Nurse may visit PRN to address patientos wound care needs. o FACE TO FACE ENCOUNTER: MEDICARE and MEDICAID PATIENTS: I certify that this patient is under my care and that I had a face-to-face encounter that meets the physician face-to-face encounter requirements with this patient on this date. The encounter with the patient was in whole or in part for the following MEDICAL CONDITION: (primary reason for Home Healthcare) MEDICAL NECESSITY: I certify, that based on my findings, NURSING services are a medically necessary home health service. HOME BOUND STATUS: I certify that  my clinical findings support that this patient is homebound (i.e., Due to illness or injury, pt requires aid of supportive devices such as crutches, cane, wheelchairs, walkers, the use of special transportation or the assistance of another person to leave their place of residence. There is a normal inability to leave the home and doing so requires considerable and taxing effort. Other absences are for medical reasons / religious services and are infrequent or of short duration when for other reasons). o If current dressing causes regression in wound condition, may D/C ordered dressing product/s and apply Normal Saline Moist Dressing daily until next Wound Healing Center / Other MD appointment. Notify Wound Healing Center of regression in wound condition at [phone]. o Please direct any NON-WOUND related issues/requests for orders to patient's Primary Care Physician Wound #4 Right,Medial Toe Third o Continue Home Health Visits - [facility] o Home Health Nurse may visit PRN to address patientos wound care needs. o FACE TO FACE ENCOUNTER: MEDICARE and MEDICAID PATIENTS: I certify that this patient is under my care and that I had a face-to-face encounter that meets the physician face-to-face encounter requirements with this patient on this date. The encounter with the patient was in whole or in part for the following MEDICAL CONDITION: (primary reason for Home Healthcare) MEDICAL NECESSITY: I certify, that based on my findings, NURSING services are a medically necessary home health service. HOME BOUND STATUS: I certify that my clinical findings support that this patient is homebound (i.e., Due to illness or injury, pt requires aid of supportive devices such as crutches, cane, wheelchairs, walkers, the use of special transportation or the assistance of another person to leave their place of residence. There is a normal inability to leave the home and doing so requires considerable and  taxing effort. Other absences are for medical reasons / religious services and are infrequent or of short duration when for other reasons). o If current dressing causes regression in wound condition, may D/C ordered dressing product/s and apply Normal Saline Moist Dressing daily until next Wound Healing Center / Other MD appointment. Notify Wound Healing Center of regression in wound condition at [phone]. o Please direct any NON-WOUND related issues/requests for orders to patient's Primary Care Physician [name], [name] ([id number]) Wound #5 Right,Plantar Toe Fourth o Continue Home Health Visits - [facility] o Home Health Nurse may visit PRN to address patientos wound care needs. o FACE TO FACE ENCOUNTER: MEDICARE and MEDICAID PATIENTS: I certify that this patient is under my care and that I had a face-to-face encounter that meets the physician face-to-face encounter requirements with this patient on this date. The encounter with the patient was in whole or in part for the following MEDICAL CONDITION: (primary  reason for Home Healthcare) MEDICAL NECESSITY: I certify, that based on my findings, NURSING services are a medically necessary home health service. HOME BOUND STATUS: I certify that my clinical findings support that this patient is homebound (i.e., Due to illness or injury, pt requires aid of supportive devices such as crutches, cane, wheelchairs, walkers, the use of special transportation or the assistance of another person to leave their place of residence. There is a normal inability to leave the home and doing so requires considerable and taxing effort. Other absences are for medical reasons / religious services and are infrequent or of short duration when for other reasons). o If current dressing causes regression in wound condition, may D/C ordered dressing product/s and apply Normal Saline Moist Dressing daily until next Wound Healing Center / Other  MD appointment. Notify Wound Healing Center of regression in wound condition at [phone]. o Please direct any NON-WOUND related issues/requests for orders to patient's Primary Care Physician Electronic Signature(s) Signed: 04/10/2015 12:22:30 PM By: Evlyn Kanner MD, FACS Signed: 04/10/2015 5:45:58 PM By: Curtis Sites Entered By: Curtis Sites on 04/10/2015 11:23:42 [name] ([id number]) -------------------------------------------------------------------------------- Problem List Details Patient Name: [name] Date of Service: 04/10/2015 10:15 AM Medical Record Number: [id number] Patient Account Number: [id number] Date of Birth/Sex: 1921/04/26 (81 y.o. 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 L97.812 Non-pressure chronic ulcer of other part of right lower leg 02/11/2015 Yes with fat layer exposed T81.31XA Disruption of external operation (surgical) wound, not 02/18/2015 Yes elsewhere classified, initial encounter Inactive Problems Resolved Problems Electronic Signature(s) Signed: 04/10/2015 11:48:53 AM By: Evlyn Kanner MD, FACS Entered By: Evlyn Kanner on 04/10/2015 11:48:53 [name] ([id number]) -------------------------------------------------------------------------------- Progress Note Details Patient Name: [name] Date of Service: 04/10/2015 10:15 AM Medical Record Number: [id number] Patient Account  Number: [id number] Date of Birth/Sex: 1921-02-06 (81 y.o. Female) Treating RN: Primary Care Physician: Terance Hart, DAVID Other Clinician: Referring Physician: Terance Hart, DAVID Treating Physician/Extender: Rudene Re in Treatment: 8 Subjective Chief Complaint Information obtained from Patient Patient presents to the wound care center for a consult due non healing wound. 81 year old patient with comes with a history of a ulcerated area to the right third toe and the right heel which she's had for about 2 months. History of Present Illness (HPI) The following HPI elements were documented for the patient's wound: Location: right medial heel and right third toe Quality: Patient reports experiencing a dull pain to affected area(s). Severity: Patient states wound are getting worse. Duration: Patient has had the wound for > 3 months prior to seeking treatment at the wound center Timing: Pain in wound is Intermittent (comes and goes Context: The wound appeared gradually over time Modifying Factors: Consults to this date include:vascular surgeon and several recent surgeries. Associated Signs and Symptoms: Patient reports having foul odor and drainage from the [name]. A pleasant 81 year old patient who is known to have diabetes mellitus for several years has recently gone through a series of operations with the vascular surgeon Dr. Gilda Crease. In January and February she had several surgeries on the  [name]. 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 On 01/07/2015 she also had a right lower extremity distal runoff with a angioplasty of the right anterior tibial artery to maximize her blood flow to the foot. She recently had her staples removed at Dr. Marijean Heath office on 01/31/2015 and at that time a right  lower extremity duplex was done which showed patent vessels and a patent stent with distal occluded posterior tibial artery. Though the duplex was noncritical the recommendations from the PA at the vascular surgery office was that of a angiogram to be done but the patient said she would rather try some bone care before. The patient has received doxycycline and Cipro in the recent past and she takes oral medications for her diabetes. Other than that I reviewed her list of all her medications. No recent hemoglobin A1c has been done and no recent x-rays of the right foot have been done. 02/18/2015 -- x-ray of the right foot was done on 02/11/2015 and it shows no evidence of acute osteomyelitis of the third toe or of the calcaneus. She had gone to the vascular surgery office and they had noted that there is dehiscence of [name] of the below-knee wound and they have asked [place] to kindly take over the care of this. There've been doing dressings for the right heel and right third toe. [name], [name] ([id number]) 02/25/2015 -- we have received notes from the vascular group who saw her last on 02/13/2015 and she was seen by the PA Ms. Cleda Daub. She had recommended that the patient continue to follow with [place] for wound care including management of [name], which has had dehiscence of the lateral part. They will consider repeating a arterial duplex or angiogram if the right lower extremity does not heal within a reasonable period of time. 03/18/2015 -- he saw the vascular surgeon Dr. Gilda Crease and he has set her up for an angioplasty sometime in the middle of July. she is doing well otherwise. 04/10/2015 -- the patient had a procedure done on 04/08/2015 and this was a right angioplasty of the dorsalis pedis, anterior tibial artery, first superficial femoral artery in its midportion. There was successful intervention with recanalization and in-line flow to the  right foot. Objective Constitutional Pulse regular. Respirations normal and unlabored. Afebrile. Vitals Time Taken: 10:34 AM, Height: 62 in, Weight: 155 lbs, BMI: 28.3, Temperature: 97.6 F, Pulse: 56 bpm, Respiratory Rate: 18 breaths/min, Blood Pressure: 109/94 mmHg. Eyes Nonicteric. Reactive to light. Ears, Nose, Mouth, and Throat Lips, teeth, and gums WNL.Marland Kitchen Moist mucosa without lesions . Neck supple and nontender. No palpable supraclavicular or cervical adenopathy. Normal sized without goiter. Respiratory WNL. No retractions.. Cardiovascular Pedal Pulses WNL. she has got significant edema of both lower extremities and also the [name] below-knee stump.. Musculoskeletal Adexa without tenderness or enlargement.. Digits and nails w/o clubbing, cyanosis, infection, petechiae, ischemia, or inflammatory conditions.Marland Kitchen Psychiatric Judgement and insight Intact.. No evidence of depression, anxiety, or agitation.Marland Kitchen [name], [name] ([id number]) General Notes: Sharp debridement is needed to be done on all the wounds and this is done with a #3 curette. Integumentary (Hair, Skin) No suspicious lesions. No crepitus or fluctuance. No peri-wound warmth or erythema. No masses.. Wound #1 status is Open. Original cause of wound was Gradually Appeared. The wound is located on  the Right Calcaneous. The wound measures 1.8cm length x 2cm width x 0.2cm depth; 2.827cm^2 area and 0.565cm^3 volume. The wound is limited to skin breakdown. There is no tunneling or undermining noted. There is a large amount of serous drainage noted. The wound margin is distinct with the outline attached to the wound base. There is medium (34-66%) red granulation within the wound bed. There is a medium (34- 66%) amount of necrotic tissue within the wound bed including Eschar and Adherent Slough. The periwound skin appearance exhibited: Maceration, Moist. The periwound skin appearance did not exhibit: Callus, Crepitus, Excoriation,  Fluctuance, Friable, Induration, Localized Edema, Rash, Scarring, Dry/Scaly, Atrophie Blanche, Cyanosis, Ecchymosis, Hemosiderin Staining, Mottled, Pallor, Rubor, Erythema. Periwound temperature was noted as No Abnormality. The periwound has tenderness on palpation. Wound #2 status is Open. Original cause of wound was Gradually Appeared. The wound is located on the Right Toe Third. The wound measures 1cm length x 1cm width x 0.1cm depth; 0.785cm^2 area and 0.079cm^3 volume. The wound is limited to skin breakdown. There is no tunneling or undermining noted. There is a large amount of serous drainage noted. The wound margin is flat and intact. There is small (1-33%) pink granulation within the wound bed. There is a large (67-100%) amount of necrotic tissue within the wound bed including Adherent Slough. The periwound skin appearance exhibited: Maceration, Moist. The periwound skin appearance did not exhibit: Callus, Crepitus, Excoriation, Fluctuance, Friable, Induration, Localized Edema, Rash, Scarring, Dry/Scaly, Atrophie Blanche, Cyanosis, Ecchymosis, Hemosiderin Staining, Mottled, Pallor, Rubor, Erythema. Periwound temperature was noted as No Abnormality. Wound #3 status is Open. Original cause of wound was Surgical Injury. The wound is located on the [name] Amputation Site - Below Knee. The wound measures 1.3cm length x 5.2cm width x 0.7cm depth; 5.309cm^2 area and 3.717cm^3 volume. The wound is limited to skin breakdown. There is no tunneling or undermining noted. There is a large amount of serous drainage noted. The wound margin is flat and intact. There is small (1-33%) pink, pale granulation within the wound bed. There is a large (67-100%) amount of necrotic tissue within the wound bed including Eschar and Adherent Slough. The periwound skin appearance exhibited: Maceration, Moist. The periwound skin appearance did not exhibit: Callus, Crepitus, Excoriation, Fluctuance, Friable, Induration,  Localized Edema, Rash, Scarring, Dry/Scaly, Atrophie Blanche, Cyanosis, Ecchymosis, Hemosiderin Staining, Mottled, Pallor, Rubor, Erythema. Periwound temperature was noted as No Abnormality. The periwound has tenderness on palpation. Wound #4 status is Open. Original cause of wound was Gradually Appeared. The wound is located on the Right,Medial Toe Third. The wound measures 0.2cm length x 0.2cm width x 0.1cm depth; 0.031cm^2 area and 0.003cm^3 volume. The wound is limited to skin breakdown. There is no tunneling or undermining noted. There is a large amount of serous drainage noted. The wound margin is flat and intact. There is large (67-100%) pink, pale granulation within the wound bed. There is no necrotic tissue within the wound bed. The periwound skin appearance exhibited: Maceration, Moist. The periwound skin appearance did not exhibit: Callus, Crepitus, Excoriation, Fluctuance, Friable, Induration, Localized Edema, Rash, Scarring, Dry/Scaly, Atrophie Blanche, Cyanosis, Ecchymosis, Hemosiderin Staining, Mottled, Pallor, Rubor, Erythema. Periwound temperature was noted as No Abnormality. [name], [name] ([id number]) Wound #5 status is Open. Original cause of wound was Gradually Appeared. The wound is located on the Right,Plantar Toe Fourth. The wound measures 0.4cm length x 0.4cm width x 0.1cm depth; 0.126cm^2 area and 0.013cm^3 volume. The wound is limited to skin breakdown. There is no tunneling or undermining noted. There  is a none present amount of drainage noted. The wound margin is indistinct and nonvisible. There is no granulation within the wound bed. There is a large (67-100%) amount of necrotic tissue within the wound bed including Eschar. The periwound skin appearance exhibited: Dry/Scaly. The periwound skin appearance did not exhibit: Callus, Crepitus, Excoriation, Fluctuance, Friable, Induration, Localized Edema, Rash, Scarring, Maceration, Moist, Atrophie Blanche, Cyanosis,  Ecchymosis, Hemosiderin Staining, Mottled, Pallor, Rubor, Erythema. Periwound temperature was noted as No Abnormality. The periwound has tenderness on palpation. 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 We will use Santyl to be packed into all the wounds especially the stump with some packing strips. All other supportive care is to be given. 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 There was a Skin/Subcutaneous Tissue Debridement ([id number]) debridement with total area of 3.6 sq cm performed by Tristan Schroeder., MD. with the following instrument(s): Curette to remove Viable and Non-Viable tissue/material including Fibrin/Slough and Subcutaneous after achieving pain control using Lidocaine 4% Topical Solution. A time out was conducted prior to the start of the procedure. A Minimum amount of bleeding was controlled with Pressure. The procedure was tolerated well with a pain level of 0 throughout and a pain level of 0 following the procedure. Post Debridement Measurements: 1.8cm length x 2cm width x 0.2cm depth; 0.565cm^3 volume. [name], [name] ([id number]) Wound #2 Wound #2 is an Arterial Insufficiency Ulcer located on the Right Toe Third . There was a Skin/Subcutaneous Tissue Debridement ([id number]) debridement with total area of 1 sq  cm performed by Tristan Schroeder., MD. with the following instrument(s): Curette to remove Viable and Non-Viable tissue/material including Fibrin/Slough and Subcutaneous after achieving pain control using Lidocaine 4% Topical Solution. A time out was conducted prior to the start of the procedure. A Minimum amount of bleeding was controlled with Pressure. The procedure was tolerated well with a pain level of 0 throughout and a pain level of 0 following the procedure. Post Debridement Measurements: 1cm length x 1cm width x 0.1cm depth; 0.079cm^3 volume. Wound #3 Wound #3 is a Dehisced Wound located on the [name] Amputation Site - Below Knee . There was a Skin/Subcutaneous Tissue Debridement ([id number]) debridement with total area of 6.5 sq cm performed by Tristan Schroeder., MD. with the following instrument(s): Curette to remove Viable and Non-Viable tissue/material including Fibrin/Slough and Subcutaneous after achieving pain control using Lidocaine 4% Topical Solution. A time out was conducted prior to the start of the procedure. A Minimum amount of bleeding was controlled with Pressure. The procedure was tolerated well with a pain level of 0 throughout and a pain level of 0 following the procedure. Post Debridement Measurements: 1.3cm length x 5.2cm width x 0.7cm depth; 3.717cm^3 volume. Plan Wound Cleansing: Wound #1 Right Calcaneous: Clean wound with Normal Saline. Wound #2 Right Toe Third: Clean wound with Normal Saline. Wound #3 [name] Amputation Site -  Below Knee: Clean wound with Normal Saline. Wound #4 Right,Medial Toe Third: Clean wound with Normal Saline. Wound #5 Right,Plantar Toe Fourth: Clean wound with Normal Saline. Anesthetic: Wound #1 Right Calcaneous: Topical Lidocaine 4% cream applied to wound bed prior to debridement Wound #2 Right Toe Third: Topical Lidocaine 4% cream applied to wound bed prior to debridement Wound #3 [name] Amputation Site - Below Knee: Topical  Lidocaine 4% cream applied to wound bed prior to debridement Wound #4 Right,Medial Toe Third: Topical Lidocaine 4% cream applied to wound bed prior to debridement Wound #5 Right,Plantar Toe Fourth: Topical Lidocaine 4% cream applied to wound bed prior to debridement Primary Wound Dressing: [name], [name] ([id number]) Wound #1 Right Calcaneous: Santyl Ointment Wound #2 Right Toe Third: Santyl Ointment Wound #3 [name] Amputation Site - Below Knee: Santyl Ointment Plain packing gauze Wound #4 Right,Medial Toe Third: Santyl Ointment Wound #5 Right,Plantar Toe Fourth: Other: - lambs wool Secondary Dressing: Wound #1 Right Calcaneous: Boardered Foam Dressing XtraSorb - to weeping skin and over wound Wound #2 Right Toe Third: Gauze and Kerlix/Conform Wound #3 [name] Amputation Site - Below Knee: Boardered Foam Dressing XtraSorb - to weeping skin and over wound Wound #4 Right,Medial Toe Third: Gauze and Kerlix/Conform Wound #5 Right,Plantar Toe Fourth: Gauze and Kerlix/Conform Dressing Change Frequency: Wound #1 Right Calcaneous: Change dressing every day. Wound #2 Right Toe Third: Change dressing every day. Wound #3 [name] Amputation Site - Below Knee: Change dressing every day. Wound #4 Right,Medial Toe Third: Change dressing every day. Wound #5 Right,Plantar Toe Fourth: Change dressing every day. Follow-up Appointments: Wound #1 Right Calcaneous: Return Appointment in 1 week. Wound #2 Right Toe Third: Return Appointment in 1 week. Wound #3 [name] Amputation Site - Below Knee: Return Appointment in 1 week. Wound #4 Right,Medial Toe Third: Return Appointment in 1 week. Wound #5 Right,Plantar Toe Fourth: Return Appointment in 1 week. Home Health: Wound #1 Right Calcaneous: Continue Home Health Visits - [facility] Nurse may visit PRN to address patient s wound care needs. FACE TO FACE ENCOUNTER: MEDICARE and MEDICAID PATIENTS: I certify that this patient is  under [name], [name] ([id number]) my care and that I had a face-to-face encounter that meets the physician face-to-face encounter requirements with this patient on this date. The encounter with the patient was in whole or in part for the following MEDICAL CONDITION: (primary reason for Home Healthcare) MEDICAL NECESSITY: I certify, that based on my findings, NURSING services are a medically necessary home health service. HOME BOUND STATUS: I certify that my clinical findings support that this patient is homebound (i.e., Due to illness or injury, pt requires aid of supportive devices such as crutches, cane, wheelchairs, walkers, the use of special transportation or the assistance of another person to leave their place of residence. There is a normal inability to leave the home and doing so requires considerable and taxing effort. Other absences are for medical reasons / religious services and are infrequent or of short duration when for other reasons). If current dressing causes regression in wound condition, may D/C ordered dressing product/s and apply Normal Saline Moist Dressing daily until next Wound Healing Center / Other MD appointment. Notify Wound Healing Center of regression in wound condition at [phone]. Please direct any NON-WOUND related issues/requests for orders to patient's Primary Care Physician Wound #2 Right Toe Third: Continue Home Health Visits - [facility] Health Nurse may visit PRN to address patient s wound care needs. FACE TO FACE ENCOUNTER: MEDICARE and MEDICAID PATIENTS:  I certify that this patient is under my care and that I had a face-to-face encounter that meets the physician face-to-face encounter requirements with this patient on this date. The encounter with the patient was in whole or in part for the following MEDICAL CONDITION: (primary reason for Home Healthcare) MEDICAL NECESSITY: I certify, that based on my findings, NURSING services are a medically  necessary home health service. HOME BOUND STATUS: I certify that my clinical findings support that this patient is homebound (i.e., Due to illness or injury, pt requires aid of supportive devices such as crutches, cane, wheelchairs, walkers, the use of special transportation or the assistance of another person to leave their place of residence. There is a normal inability to leave the home and doing so requires considerable and taxing effort. Other absences are for medical reasons / religious services and are infrequent or of short duration when for other reasons). If current dressing causes regression in wound condition, may D/C ordered dressing product/s and apply Normal Saline Moist Dressing daily until next Wound Healing Center / Other MD appointment. Notify Wound Healing Center of regression in wound condition at [phone]. Please direct any NON-WOUND related issues/requests for orders to patient's Primary Care Physician Wound #3 [name] Amputation Site - Below Knee: Continue Home Health Visits - [facility] Health Nurse may visit PRN to address patient s wound care needs. FACE TO FACE ENCOUNTER: MEDICARE and MEDICAID PATIENTS: I certify that this patient is under my care and that I had a face-to-face encounter that meets the physician face-to-face encounter requirements with this patient on this date. The encounter with the patient was in whole or in part for the following MEDICAL CONDITION: (primary reason for Home Healthcare) MEDICAL NECESSITY: I certify, that based on my findings, NURSING services are a medically necessary home health service. HOME BOUND STATUS: I certify that my clinical findings support that this patient is homebound (i.e., Due to illness or injury, pt requires aid of supportive devices such as crutches, cane, wheelchairs, walkers, the use of special transportation or the assistance of another person to leave their place of residence. There is a normal inability to  leave the home and doing so requires considerable and taxing effort. Other absences are for medical reasons / religious services and are infrequent or of short duration when for other reasons). If current dressing causes regression in wound condition, may D/C ordered dressing product/s and apply Normal Saline Moist Dressing daily until next Wound Healing Center / Other MD appointment. Notify Wound Healing Center of regression in wound condition at [phone]. Please direct any NON-WOUND related issues/requests for orders to patient's Primary Care Physician Wound #4 Right,Medial Toe Third: Continue Home Health Visits - [facility] Nurse may visit PRN to address patient s wound care needs. FACE TO FACE ENCOUNTER: MEDICARE and MEDICAID PATIENTS: I certify that this patient is under [name], [name] ([id number]) my care and that I had a face-to-face encounter that meets the physician face-to-face encounter requirements with this patient on this date. The encounter with the patient was in whole or in part for the following MEDICAL CONDITION: (primary reason for Home Healthcare) MEDICAL NECESSITY: I certify, that based on my findings, NURSING services are a medically necessary home health service. HOME BOUND STATUS: I certify that my clinical findings support that this patient is homebound (i.e., Due to illness or injury, pt requires aid of supportive devices such as crutches, cane, wheelchairs, walkers, the use of special transportation or the assistance of another person  to leave their place of residence. There is a normal inability to leave the home and doing so requires considerable and taxing effort. Other absences are for medical reasons / religious services and are infrequent or of short duration when for other reasons). If current dressing causes regression in wound condition, may D/C ordered dressing product/s and apply Normal Saline Moist Dressing daily until next Wound Healing  Center / Other MD appointment. Notify Wound Healing Center of regression in wound condition at [phone]. Please direct any NON-WOUND related issues/requests for orders to patient's Primary Care Physician Wound #5 Right,Plantar Toe Fourth: Continue Home Health Visits - [facility] Health Nurse may visit PRN to address patient s wound care needs. FACE TO FACE ENCOUNTER: MEDICARE and MEDICAID PATIENTS: I certify that this patient is under my care and that I had a face-to-face encounter that meets the physician face-to-face encounter requirements with this patient on this date. The encounter with the patient was in whole or in part for the following MEDICAL CONDITION: (primary reason for Home Healthcare) MEDICAL NECESSITY: I certify, that based on my findings, NURSING services are a medically necessary home health service. HOME BOUND STATUS: I certify that my clinical findings support that this patient is homebound (i.e., Due to illness or injury, pt requires aid of supportive devices such as crutches, cane, wheelchairs, walkers, the use of special transportation or the assistance of another person to leave their place of residence. There is a normal inability to leave the home and doing so requires considerable and taxing effort. Other absences are for medical reasons / religious services and are infrequent or of short duration when for other reasons). If current dressing causes regression in wound condition, may D/C ordered dressing product/s and apply Normal Saline Moist Dressing daily until next Wound Healing Center / Other MD appointment. Notify Wound Healing Center of regression in wound condition at [phone]. Please direct any NON-WOUND related issues/requests for orders to patient's Primary Care Physician Due to the edema both lower extremities I have asked her to talk to her PCP about increasing her dose of Lasix and other measures. We will use Santyl to be packed into all the  wounds especially the stump with some packing strips. All other supportive care is to be given. Electronic Signature(s) Signed: 04/10/2015 11:52:57 AM By: Evlyn Kanner MD, FACS Entered By: Evlyn Kanner on 04/10/2015 11:52:57 [name] ([id number]) -------------------------------------------------------------------------------- SuperBill Details Patient Name: [name] Date of Service: 04/10/2015 Medical Record Number: [id number] Patient Account Number: [id number] Date of Birth/Sex: May 16, 1921 (81 y.o. 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 L97.812 Non-pressure chronic ulcer of other part of right lower leg with fat layer exposed Disruption of external operation (surgical) wound, not elsewhere classified, initial T81.31XA encounter Facility Procedures CPT4: Description Modifier Quantity Code [id number] 11042 - DEB SUBQ TISSUE 20 SQ CM/< 1 ICD-10 Description Diagnosis E11.621 Type 2 diabetes mellitus with foot ulcer I70.235 Atherosclerosis of native arteries of right leg with ulceration of other part of  foot 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 L97.812 Non-pressure chronic ulcer of other part of right  lower leg with fat layer exposed Physician Procedures CPT4: Description Modifier Quantity Code [id number] - WC PHYS SUBQ TISS 20 SQ CM 1 ICD-10 Description Diagnosis E11.621 Type 2 diabetes mellitus with foot ulcer I70.235 Atherosclerosis of native arteries of right leg with  ulceration of other part of  foot 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

## 2015-04-11 NOTE — Progress Notes (Signed)
[name], [name] ([id number]) Visit Report for 04/10/2015 Arrival Information Details Patient Name: [name] Date of Service: 04/10/2015 10:15 AM Medical Record Number: [id number] Patient Account Number: [id number] Date of Birth/Sex: 14-Jan-1921 (81 y.o. Female) Treating RN: Curtis Sites Primary Care Physician: Dorothey Baseman Other Clinician: Referring Physician: Dorothey Baseman Treating Physician/Extender: Rudene Re in Treatment: 8 Visit Information History Since Last Visit Added or deleted any medications: No Patient Arrived: Wheel Chair Any new allergies or adverse reactions: No Arrival Time: 10:33 Had a fall or experienced change in No activities of daily living that may affect Accompanied By: dtr risk of falls: Transfer Assistance: Manual Signs or symptoms of abuse/neglect since last No Patient Identification Verified: Yes visito Secondary Verification Process Yes Hospitalized since last visit: No Completed: Pain Present Now: No Patient Has Alerts: Yes Patient Alerts: DMII Electronic Signature(s) Signed: 04/10/2015 5:45:58 PM By: Curtis Sites Entered By: Curtis Sites on 04/10/2015 10:33:31 [name] ([id number]) -------------------------------------------------------------------------------- Encounter Discharge Information Details Patient Name: [name] Date of Service: 04/10/2015 10:15 AM Medical Record Number: [id number] Patient Account Number: [id number] Date of Birth/Sex: [birth year] (81 y.o. Female) Treating RN: Curtis Sites Primary Care Physician: Dorothey Baseman Other Clinician: Referring Physician: Dorothey Baseman Treating Physician/Extender: Rudene Re in Treatment: 8 Encounter Discharge Information Items Discharge Pain Level: 0 Discharge Condition: Stable Ambulatory Status: Wheelchair Discharge Destination: Home Private Transportation: Auto Accompanied By: dtr in law Schedule Follow-up Appointment:  Yes Medication Reconciliation completed and No provided to Patient/Care Laporsche Hoeger: Clinical Summary of Care: Electronic Signature(s) Signed: 04/10/2015 5:45:58 PM By: Curtis Sites Entered By: Curtis Sites on 04/10/2015 11:48:46 [name] ([id number]) -------------------------------------------------------------------------------- Lower Extremity Assessment Details Patient Name: [name] Date of Service: 04/10/2015 10:15 AM Medical Record Number: [id number] Patient Account Number: [id number] Date of Birth/Sex: 1921/08/28 (81 y.o. Female) Treating RN: Curtis Sites Primary Care Physician: Dorothey Baseman Other Clinician: Referring Physician: Terance Hart, DAVID Treating Physician/Extender: Rudene Re in Treatment: 8 Vascular Assessment Pulses: Posterior Tibial Dorsalis Pedis Palpable: [Right:Yes] Extremity colors, hair growth, and conditions: Extremity Color: [Right:Pale] Hair Growth on Extremity: [Right:No] Temperature of Extremity: [Right:Warm] Capillary Refill: [Right:< 3 seconds] Electronic Signature(s) Signed: 04/10/2015 5:45:58 PM By: Curtis Sites Entered By: Curtis Sites on 04/10/2015 10:44:22 [name] ([id number]) -------------------------------------------------------------------------------- Multi Wound Chart Details Patient Name: [name] Date of Service: 04/10/2015 10:15 AM Medical Record Number: [id number] Patient Account Number: [id number] Date of Birth/Sex: 1921-06-03 (81 y.o. 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] Comorbid History: [1:Arrhythmia, Deep Vein Thrombosis, Peripheral Arterial Disease, Type II Arterial Disease, Type II Arterial Disease, Type II Diabetes] [2:Arrhythmia, Deep Vein Thrombosis, Peripheral Diabetes] [3:Arrhythmia, Deep Vein Thrombosis,  Peripheral Diabetes] Date Acquired: [1:11/04/2014] [2:11/04/2014] [3:01/03/2015] Weeks of Treatment: [1:8] [2:8] [3:7] Wound Status: [1:Open] [2:Open] [3:Open] Measurements L x W x D 1.8x2x0.2 [2:1x1x0.1] [3:1.3x5.2x0.7] (cm) Area (cm) : [1:2.827] [2:0.785] [3:5.309] Volume (cm) : [1:0.565] [2:0.079] [3:3.717] % Reduction in Area: [1:48.60%] [2:16.70%] [3:48.80%] % Reduction in Volume: -2.70% [2:16.00%] [3:10.40%] Classification: [1:Full Thickness Without Exposed Support Structures] [2:Full Thickness Without Exposed Support Structures] [3:Full Thickness Without Exposed Support Structures] HBO Classification: [  1:Grade 1] [2:Grade 1] [3:Grade 1] Exudate Amount: [1:Large] [2:Large] [3:Large] Exudate Type: [1:Serous] [2:Serous] [3:Serous] Exudate Color: [1:amber] [2:amber] [3:amber] Wound Margin: [1:Distinct, outline attached Flat and Intact] [3:Flat and Intact] Granulation Amount: [1:Medium (34-66%)] [2:Small (1-33%)] [3:Small (1-33%)] Granulation Quality: [1:Red] [2:Pink] [3:Pink, Pale] Necrotic Amount: [1:Medium (34-66%)] [2:Large (67-100%)] [3:Large (67-100%)] Necrotic Tissue: [1:Eschar, Adherent Slough Adherent Slough] [3:Eschar, Adherent Slough] Exposed Structures: Fascia: No Fascia: No Fascia: No Fat: No Fat: No Fat: No Tendon: No Tendon: No Tendon: No Muscle: No Muscle: No Muscle: No Joint: No Joint: No Joint: No Bone: No Bone: No Bone: No Limited to Skin Limited to Skin Limited to  Skin Breakdown Breakdown Breakdown Epithelialization: None None None Periwound Skin Texture: Edema: No Edema: No Edema: No Excoriation: No Excoriation: No Excoriation: No Induration: No Induration: No Induration: No Callus: No Callus: No Callus: No Crepitus: No Crepitus: No Crepitus: No Fluctuance: No Fluctuance: No Fluctuance: No Friable: No Friable: No Friable: No Rash: No Rash: No Rash: No Scarring: No Scarring: No Scarring: No Periwound Skin Maceration: Yes Maceration: Yes Maceration: Yes Moisture: Moist: Yes Moist: Yes Moist: Yes Dry/Scaly: No Dry/Scaly: No Dry/Scaly: No Periwound Skin Color: Atrophie Blanche: No Atrophie Blanche: No Atrophie Blanche: No Cyanosis: No Cyanosis: No Cyanosis: No Ecchymosis: No Ecchymosis: No Ecchymosis: No Erythema: No Erythema: No Erythema: No Hemosiderin Staining: No Hemosiderin Staining: No Hemosiderin Staining: No Mottled: No Mottled: No Mottled: No Pallor: No Pallor: No Pallor: No Rubor: No Rubor: No Rubor: No Temperature: No Abnormality No Abnormality No Abnormality Tenderness on Yes No Yes Palpation: Wound Preparation: Ulcer Cleansing: Ulcer Cleansing: Ulcer Cleansing: Rinsed/Irrigated with Rinsed/Irrigated with Rinsed/Irrigated with Saline Saline Saline Topical Anesthetic Topical Anesthetic Topical Anesthetic Applied: Other: lidocaine Applied: Other: lidocaine Applied: Other: lidocaine 4% 4% 4% Wound Number: 4 5 N/A Photos: No Photos No Photos N/A Wound Location: Right Toe Third - Medial Right Toe Fourth - Plantar N/A Wounding Event: Gradually Appeared Gradually Appeared N/A 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 Lower Extremity Comorbid History: Arrhythmia, Deep Vein Arrhythmia, Deep Vein N/A Thrombosis, Peripheral Thrombosis, Peripheral Arterial Disease, Type II Arterial Disease, Type II Diabetes Diabetes [name], [name]  ([id number]) Date Acquired: 02/25/2015 03/24/2015 N/A Weeks of Treatment: 4 1 N/A Wound Status: Open Open N/A Measurements L x W x D 0.2x0.2x0.1 0.4x0.4x0.1 N/A (cm) Area (cm) : 0.031 0.126 N/A Volume (cm) : 0.003 0.013 N/A % Reduction in Area: 75.40% 19.70% N/A % Reduction in Volume: 88.00% 18.80% N/A Classification: Grade 2 Unable to visualize wound N/A bed HBO Classification: N/A N/A N/A Exudate Amount: Large None Present N/A Exudate Type: Serous N/A N/A Exudate Color: amber N/A N/A Wound Margin: Flat and Intact Indistinct, nonvisible N/A Granulation Amount: Large (67-100%) None Present (0%) N/A Granulation Quality: Pink, Pale N/A N/A Necrotic Amount: None Present (0%) Large (67-100%) N/A Necrotic Tissue: N/A Eschar N/A Exposed Structures: Fascia: No Fascia: No N/A Fat: No Fat: No Tendon: No Tendon: No Muscle: No Muscle: No Joint: No Joint: No Bone: No Bone: No Limited to Skin Limited to Skin Breakdown Breakdown Epithelialization: None None N/A Periwound Skin Texture: Edema: No Edema: No N/A Excoriation: No Excoriation: No Induration: No Induration: No Callus: No Callus: No Crepitus: No Crepitus: No Fluctuance: No Fluctuance: No Friable: No Friable: No Rash: No Rash: No Scarring: No Scarring: No Periwound Skin Maceration: Yes Dry/Scaly: Yes N/A Moisture: Moist: Yes Maceration: No Dry/Scaly: No Moist: No Periwound Skin Color: Atrophie Blanche: No Atrophie Blanche: No N/A Cyanosis: No Cyanosis:  No Ecchymosis: No Ecchymosis: No Erythema: No Erythema: No Hemosiderin Staining: No Hemosiderin Staining: No Mottled: No Mottled: No Pallor: No Pallor: No Rubor: No Rubor: No Temperature: No Abnormality No Abnormality N/A [name], [name] ([id number]) Tenderness on No Yes N/A Palpation: Wound Preparation: Ulcer Cleansing: Ulcer Cleansing: N/A Rinsed/Irrigated with Rinsed/Irrigated with Saline Saline Topical Anesthetic Topical  Anesthetic Applied: Other: lidocaine Applied: Other: lidocaine 4% 4% Treatment Notes Electronic Signature(s) Signed: 04/10/2015 11:00:12 AM By: Curtis Sites Entered By: Curtis Sites on 04/10/2015 11:00:12 [name] ([id number]) -------------------------------------------------------------------------------- Multi-Disciplinary Care Plan Details Patient Name: [name] Date of Service: 04/10/2015 10:15 AM Medical Record Number: [id number] Patient Account Number: [id number] Date of Birth/Sex: 02/12/1921 (81 y.o. Female) Treating RN: Curtis Sites Primary Care Physician: Dorothey Baseman Other Clinician: Referring Physician: Dorothey Baseman Treating Physician/Extender: Rudene Re in Treatment: 8 Active Inactive Abuse / Safety / Falls / Self Care Management Nursing Diagnoses: Impaired physical mobility Potential for falls Goals: Patient will remain injury free Date Initiated: 02/11/2015 Goal Status: Active Patient/caregiver will verbalize understanding of skin care regimen Date Initiated: 02/11/2015 Goal Status: Active Patient/caregiver will verbalize/demonstrate measures taken to prevent injury and/or falls Date Initiated: 02/11/2015 Goal Status: Active Patient/caregiver will verbalize/demonstrate understanding of what to do in case of emergency Date Initiated: 02/11/2015 Goal Status: Active Interventions: Assess fall risk on admission and as needed Provide education on fall prevention Provide education on safe transfers Treatment Activities: Patient referred to home care : 04/10/2015 Notes: Nutrition Nursing Diagnoses: Imbalanced nutrition Potential for alteratiion in Nutrition/Potential for imbalanced nutrition Medal, Maleena ([id number]) Goals: Patient/caregiver verbalizes understanding of need to maintain therapeutic glucose control per primary care physician Date Initiated: 02/11/2015 Goal Status: Active Patient/caregiver will maintain  therapeutic glucose control Date Initiated: 02/11/2015 Goal Status: Active Interventions: Assess HgA1c results as ordered upon admission and as needed Provide education on elevated blood sugars and impact on wound healing Provide education on nutrition Treatment Activities: Obtain HgA1c : 04/10/2015 Notes: Wound/Skin Impairment Nursing Diagnoses: Impaired tissue integrity Knowledge deficit related to ulceration/compromised skin integrity Goals: Patient/caregiver will verbalize understanding of skin care regimen Date Initiated: 02/11/2015 Goal Status: Active Ulcer/skin breakdown will heal within 14 weeks Date Initiated: 02/11/2015 Goal Status: Active Interventions: Assess patient/caregiver ability to obtain necessary supplies Assess patient/caregiver ability to perform ulcer/skin care regimen upon admission and as needed Assess ulceration(s) every visit Provide education on ulcer and skin care Treatment Activities: Skin care regimen initiated : 04/10/2015 Topical wound management initiated : 04/10/2015 Notes: ARIANNY, PUN ([id number]) Electronic Signature(s) Signed: 04/10/2015 10:59:57 AM By: Curtis Sites Entered By: Curtis Sites on 04/10/2015 10:59:57 [name] ([id number]) -------------------------------------------------------------------------------- Patient/Caregiver Education Details Patient Name: [name] Date of Service: 04/10/2015 10:15 AM Medical Record Number: [id number] Patient Account Number: [id number] Date of Birth/Gender: 08/06/1921 (81 y.o. Female) Treating RN: Curtis Sites Primary Care Physician: Dorothey Baseman Other Clinician: Referring Physician: Dorothey Baseman Treating Physician/Extender: Rudene Re in Treatment: 8 Education Assessment Education Provided To: Patient and Caregiver Education Topics Provided Basic Hygiene: Handouts: Other: skin protection around wounds to prevent maceration Methods: Demonstration,  Explain/Verbal Responses: State content correctly Wound/Skin Impairment: Handouts: Other: wound care to continue as ordered Methods: Demonstration, Explain/Verbal Responses: State content correctly Electronic Signature(s) Signed: 04/10/2015 11:01:04 AM By: Curtis Sites Entered By: Curtis Sites on 04/10/2015 11:01:04 [name] ([id number]) -------------------------------------------------------------------------------- Wound Assessment Details Patient Name: [name] Date of Service: 04/10/2015 10:15 AM Medical Record Number: [id number] Patient Account Number: [id number] Date of Birth/Sex: August 10, 1921 (81 y.o. Female) Treating RN: Curtis Sites Primary Care Physician: Terance Hart, DAVID  Other Clinician: Referring Physician: Terance HartBRONSTEIN, DAVID Treating Physician/Extender: Rudene ReBritto, Errol Weeks in Treatment: 8 Wound Status Wound Number: 1 Primary Arterial Insufficiency Ulcer Etiology: Wound Location: Right Calcaneous Wound Open Wounding Event: Gradually Appeared Status: Date Acquired: 11/04/2014 Comorbid Arrhythmia, Deep Vein Thrombosis, Weeks Of Treatment: 8 History: Peripheral Arterial Disease, Type II Clustered Wound: No Diabetes Photos Photo Uploaded By: Elliot GurneyWoody, RN, BSN, Kim on 04/10/2015 17:48:37 Wound Measurements Length: (cm) 1.8 Width: (cm) 2 Depth: (cm) 0.2 Area: (cm) 2.827 Volume: (cm) 0.565 % Reduction in Area: 48.6% % Reduction in Volume: -2.7% Epithelialization: None Tunneling: No Undermining: No Wound Description Full Thickness Without Foul Odor Aft Classification: Exposed Support Structures Diabetic Severity Grade 1 (Wagner): Wound Margin: Distinct, outline attached Exudate Amount: Large Exudate Type: Serous Exudate Color: amber er Cleansing: No Wound Bed Granulation Amount: Medium (34-66%) Exposed Structure [name], [name] ([id number]) Granulation Quality: Red Fascia Exposed: No Necrotic Amount: Medium (34-66%) Fat Layer Exposed:  No Necrotic Quality: Eschar, Adherent Slough Tendon Exposed: No Muscle Exposed: No Joint Exposed: No Bone Exposed: No Limited to Skin Breakdown Periwound Skin Texture Texture Color No Abnormalities Noted: No No Abnormalities Noted: No Callus: No Atrophie Blanche: No Crepitus: No Cyanosis: No Excoriation: No Ecchymosis: No Fluctuance: No Erythema: No Friable: No Hemosiderin Staining: No Induration: No Mottled: No Localized Edema: No Pallor: No Rash: No Rubor: No Scarring: No Temperature / Pain Moisture Temperature: No Abnormality No Abnormalities Noted: No Tenderness on Palpation: Yes Dry / Scaly: No Maceration: Yes Moist: Yes Wound Preparation Ulcer Cleansing: Rinsed/Irrigated with Saline Topical Anesthetic Applied: Other: lidocaine 4%, Treatment Notes Wound #1 (Right Calcaneous) 1. Cleansed with: Clean wound with Normal Saline 2. Anesthetic Topical Lidocaine 4% cream to wound bed prior to debridement 4. Dressing Applied: Santyl Ointment Plain packing gauze Other dressing (specify in notes) 5. Secondary Dressing Applied Bordered Foam Dressing Notes xtrasorb Electronic Signature(s) StocktonSILETZKY, [name] ([id number]) Signed: 04/10/2015 10:57:34 AM By: Curtis Sitesorthy, Joanna Previous Signature: 04/10/2015 10:56:36 AM Version By: Curtis Sitesorthy, Joanna Entered By: Curtis Sitesorthy, Joanna on 04/10/2015 10:57:33 [name] NationsSILETZKY, [name] ([id number]) -------------------------------------------------------------------------------- Wound Assessment Details Patient Name: [name] NationsSILETZKY, [name] Date of Service: 04/10/2015 10:15 AM Medical Record Number: [id number] Patient Account Number: [id number] Date of Birth/Sex: 07/09/1921 (81 y.o. Female) Treating RN: Curtis Sitesorthy, Joanna Primary Care Physician: Terance HartBRONSTEIN, DAVID Other Clinician: Referring Physician: Terance HartBRONSTEIN, DAVID Treating Physician/Extender: Rudene ReBritto, Errol Weeks in Treatment: 8 Wound Status Wound Number: 2 Primary Arterial Insufficiency  Ulcer Etiology: Wound Location: Right Toe Third Wound Open Wounding Event: Gradually Appeared Status: Date Acquired: 11/04/2014 Comorbid Arrhythmia, Deep Vein Thrombosis, Weeks Of Treatment: 8 History: Peripheral Arterial Disease, Type II Clustered Wound: No Diabetes Photos Photo Uploaded By: Elliot GurneyWoody, RN, BSN, Kim on 04/10/2015 17:49:09 Wound Measurements Length: (cm) 1 Width: (cm) 1 Depth: (cm) 0.1 Area: (cm) 0.785 Volume: (cm) 0.079 % Reduction in Area: 16.7% % Reduction in Volume: 16% Epithelialization: None Tunneling: No Undermining: No Wound Description Full Thickness Without Foul Odor Aft Classification: Exposed Support Structures Diabetic Severity Grade 1 (Wagner): Wound Margin: Flat and Intact Exudate Amount: Large Exudate Type: Serous Exudate Color: amber er Cleansing: No Wound Bed Granulation Amount: Small (1-33%) Exposed Structure Derksen, Avary ([id number]) Granulation Quality: Pink Fascia Exposed: No Necrotic Amount: Large (67-100%) Fat Layer Exposed: No Necrotic Quality: Adherent Slough Tendon Exposed: No Muscle Exposed: No Joint Exposed: No Bone Exposed: No Limited to Skin Breakdown Periwound Skin Texture Texture Color No Abnormalities Noted: No No Abnormalities Noted: No Callus: No Atrophie Blanche: No Crepitus: No Cyanosis: No Excoriation: No Ecchymosis: No Fluctuance: No Erythema: No Friable: No Hemosiderin  Staining: No Induration: No Mottled: No Localized Edema: No Pallor: No Rash: No Rubor: No Scarring: No Temperature / Pain Moisture Temperature: No Abnormality No Abnormalities Noted: No Dry / Scaly: No Maceration: Yes Moist: Yes Wound Preparation Ulcer Cleansing: Rinsed/Irrigated with Saline Topical Anesthetic Applied: Other: lidocaine 4%, Treatment Notes Wound #2 (Right Toe Third) 1. Cleansed with: Clean wound with Normal Saline 2. Anesthetic Topical Lidocaine 4% cream to wound bed prior to debridement 4.  Dressing Applied: Santyl Ointment 5. Secondary Dressing Applied Gauze and Kerlix/Conform 7. Secured with Secretary/administrator) Signed: 04/10/2015 10:57:19 AM By: Curtis Sites Entered By: Curtis Sites on 04/10/2015 10:57:18 [name] ([id number]) [name], [name] ([id number]) -------------------------------------------------------------------------------- Wound Assessment Details Patient Name: [name] Date of Service: 04/10/2015 10:15 AM Medical Record Number: [id number] Patient Account Number: [id number] Date of Birth/Sex: 15-Jul-1921 (81 y.o. Female) Treating RN: Curtis Sites Primary Care Physician: Dorothey Baseman Other Clinician: Referring Physician: Terance Hart, DAVID Treating Physician/Extender: Rudene Re in Treatment: 8 Wound Status Wound Number: 3 Primary Dehisced Wound Etiology: Wound Location: [name] Amputation Site - Below Knee Wound Open Status: Wounding Event: Surgical Injury Comorbid Arrhythmia, Deep Vein Thrombosis, Date Acquired: 01/03/2015 History: Peripheral Arterial Disease, Type II Weeks Of Treatment: 7 Diabetes Clustered Wound: No Photos Photo Uploaded By: Elliot Gurney, RN, BSN, Kim on 04/10/2015 17:49:09 Wound Measurements Length: (cm) 1.3 Width: (cm) 5.2 Depth: (cm) 0.7 Area: (cm) 5.309 Volume: (cm) 3.717 % Reduction in Area: 48.8% % Reduction in Volume: 10.4% Epithelialization: None Tunneling: No Undermining: No Wound Description Full Thickness Without Classification: Exposed Support Structures Diabetic Severity Grade 1 (Wagner): Wound Margin: Flat and Intact Exudate Amount: Large Exudate Type: Serous Exudate Color: amber Foul Odor After Cleansing: No Wound Bed Granulation Amount: Small (1-33%) Exposed Structure Belding, [name] ([id number]) Granulation Quality: Pink, Pale Fascia Exposed: No Necrotic Amount: Large (67-100%) Fat Layer Exposed: No Necrotic Quality: Eschar, Adherent Slough Tendon Exposed:  No Muscle Exposed: No Joint Exposed: No Bone Exposed: No Limited to Skin Breakdown Periwound Skin Texture Texture Color No Abnormalities Noted: No No Abnormalities Noted: No Callus: No Atrophie Blanche: No Crepitus: No Cyanosis: No Excoriation: No Ecchymosis: No Fluctuance: No Erythema: No Friable: No Hemosiderin Staining: No Induration: No Mottled: No Localized Edema: No Pallor: No Rash: No Rubor: No Scarring: No Temperature / Pain Moisture Temperature: No Abnormality No Abnormalities Noted: No Tenderness on Palpation: Yes Dry / Scaly: No Maceration: Yes Moist: Yes Wound Preparation Ulcer Cleansing: Rinsed/Irrigated with Saline Topical Anesthetic Applied: Other: lidocaine 4%, Treatment Notes Wound #3 ([name] Amputation Site - Below Knee) 1. Cleansed with: Clean wound with Normal Saline 2. Anesthetic Topical Lidocaine 4% cream to wound bed prior to debridement 4. Dressing Applied: Santyl Ointment Plain packing gauze Other dressing (specify in notes) 5. Secondary Dressing Applied Bordered Foam Dressing Notes xtrasorb Electronic Signature(s) [name], [name] ([id number]) Signed: 04/10/2015 10:59:17 AM By: Curtis Sites Previous Signature: 04/10/2015 10:58:17 AM Version By: Curtis Sites Entered By: Curtis Sites on 04/10/2015 10:59:17 [name] ([id number]) -------------------------------------------------------------------------------- Wound Assessment Details Patient Name: [name] Date of Service: 04/10/2015 10:15 AM Medical Record Number: [id number] Patient Account Number: [id number] Date of Birth/Sex: 1921/03/15 (81 y.o. Female) Treating RN: Curtis Sites Primary Care Physician: Dorothey Baseman Other Clinician: Referring Physician: Dorothey Baseman Treating Physician/Extender: Rudene Re in Treatment: 8 Wound Status Wound Number: 4 Primary Diabetic Wound/Ulcer of the Lower Etiology: Extremity Wound Location: Right Toe Third -  Medial Wound Open Wounding Event: Gradually Appeared Status: Date Acquired: 02/25/2015 Comorbid Arrhythmia, Deep Vein Thrombosis, Weeks Of Treatment: 4 History:  Peripheral Arterial Disease, Type II Clustered Wound: No Diabetes Photos Photo Uploaded By: Elliot Gurney, RN, BSN, Kim on 04/10/2015 17:49:36 Wound Measurements Length: (cm) 0.2 Width: (cm) 0.2 Depth: (cm) 0.1 Area: (cm) 0.031 Volume: (cm) 0.003 % Reduction in Area: 75.4% % Reduction in Volume: 88% Epithelialization: None Tunneling: No Undermining: No Wound Description Classification: Grade 2 Wound Margin: Flat and Intact Exudate Amount: Large Exudate Type: Serous Exudate Color: amber Foul Odor After Cleansing: No Wound Bed Granulation Amount: Large (67-100%) Exposed Structure Granulation Quality: Pink, Pale Fascia Exposed: No Necrotic Amount: None Present (0%) Fat Layer Exposed: No Tendon Exposed: No [name], [name] ([id number]) Muscle Exposed: No Joint Exposed: No Bone Exposed: No Limited to Skin Breakdown Periwound Skin Texture Texture Color No Abnormalities Noted: No No Abnormalities Noted: No Callus: No Atrophie Blanche: No Crepitus: No Cyanosis: No Excoriation: No Ecchymosis: No Fluctuance: No Erythema: No Friable: No Hemosiderin Staining: No Induration: No Mottled: No Localized Edema: No Pallor: No Rash: No Rubor: No Scarring: No Temperature / Pain Moisture Temperature: No Abnormality No Abnormalities Noted: No Dry / Scaly: No Maceration: Yes Moist: Yes Wound Preparation Ulcer Cleansing: Rinsed/Irrigated with Saline Topical Anesthetic Applied: Other: lidocaine 4%, Treatment Notes Wound #4 (Right, Medial Toe Third) 1. Cleansed with: Clean wound with Normal Saline 2. Anesthetic Topical Lidocaine 4% cream to wound bed prior to debridement 4. Dressing Applied: Santyl Ointment 5. Secondary Dressing Applied Gauze and Kerlix/Conform 7. Secured with Music therapist) Signed: 04/10/2015 10:58:59 AM By: Curtis Sites Entered By: Curtis Sites on 04/10/2015 10:58:59 [name] ([id number]) -------------------------------------------------------------------------------- Wound Assessment Details Patient Name: [name] Date of Service: 04/10/2015 10:15 AM Medical Record Number: [id number] Patient Account Number: [id number] Date of Birth/Sex: Feb 25, 1921 (81 y.o. Female) Treating RN: Curtis Sites Primary Care Physician: Terance Hart, DAVID Other Clinician: Referring Physician: Terance Hart, DAVID Treating Physician/Extender: Rudene Re in Treatment: 8 Wound Status Wound Number: 5 Primary Diabetic Wound/Ulcer of the Lower Etiology: Extremity Wound Location: Right Toe Fourth - Plantar Wound Open Wounding Event: Gradually Appeared Status: Date Acquired: 03/24/2015 Comorbid Arrhythmia, Deep Vein Thrombosis, Weeks Of Treatment: 1 History: Peripheral Arterial Disease, Type II Clustered Wound: No Diabetes Photos Photo Uploaded By: Elliot Gurney, RN, BSN, Kim on 04/10/2015 17:49:37 Wound Measurements Length: (cm) 0.4 Width: (cm) 0.4 Depth: (cm) 0.1 Area: (cm) 0.126 Volume: (cm) 0.013 % Reduction in Area: 19.7% % Reduction in Volume: 18.8% Epithelialization: None Tunneling: No Undermining: No Wound Description Classification: Unable to visualize wound bed Wound Margin: Indistinct, nonvisible Exudate Amount: None Present Wound Bed Granulation Amount: None Present (0%) Exposed Structure Necrotic Amount: Large (67-100%) Fascia Exposed: No Necrotic Quality: Eschar Fat Layer Exposed: No Tendon Exposed: No Muscle Exposed: No Joint Exposed: No [name], [name] ([id number]) Bone Exposed: No Limited to Skin Breakdown Periwound Skin Texture Texture Color No Abnormalities Noted: No No Abnormalities Noted: No Callus: No Atrophie Blanche: No Crepitus: No Cyanosis: No Excoriation: No Ecchymosis: No Fluctuance: No Erythema:  No Friable: No Hemosiderin Staining: No Induration: No Mottled: No Localized Edema: No Pallor: No Rash: No Rubor: No Scarring: No Temperature / Pain Moisture Temperature: No Abnormality No Abnormalities Noted: No Tenderness on Palpation: Yes Dry / Scaly: Yes Maceration: No Moist: No Wound Preparation Ulcer Cleansing: Rinsed/Irrigated with Saline Topical Anesthetic Applied: Other: lidocaine 4%, Treatment Notes Wound #5 (Right, Plantar Toe Fourth) 1. Cleansed with: Clean wound with Normal Saline 4. Dressing Applied: Other dressing (specify in notes) 5. Secondary Dressing Applied Kerlix/Conform Notes lambs wool for protection Electronic Signature(s) Signed: 04/10/2015 10:59:47 AM By: Curtis Sites Entered  By: Curtis Sites on 04/10/2015 10:59:47 [name] ([id number]) -------------------------------------------------------------------------------- Vitals Details Patient Name: [name] Date of Service: 04/10/2015 10:15 AM Medical Record Number: [id number] Patient Account Number: [id number] Date of Birth/Sex: 1920/10/24 (81 y.o. Female) Treating RN: Curtis Sites Primary Care Physician: Dorothey Baseman Other Clinician: Referring Physician: Terance Hart, DAVID Treating Physician/Extender: Rudene Re in Treatment: 8 Vital Signs Time Taken: 10:34 Temperature (F): 97.6 Height (in): 62 Pulse (bpm): 56 Weight (lbs): 155 Respiratory Rate (breaths/min): 18 Body Mass Index (BMI): 28.3 Blood Pressure (mmHg): 109/94 Reference Range: 80 - 120 mg / dl Electronic Signature(s) Signed: 04/10/2015 5:45:58 PM By: Curtis Sites Entered By: Curtis Sites on 04/10/2015 10:36:29

## 2015-04-15 ENCOUNTER — Ambulatory Visit: Payer: Medicare Other | Admitting: Surgery

## 2015-04-22 ENCOUNTER — Ambulatory Visit: Payer: Medicare Other | Admitting: Surgery

## 2015-04-22 ENCOUNTER — Encounter: Payer: Medicare Other | Admitting: Surgery

## 2015-04-22 DIAGNOSIS — L97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: Secondary | ICD-10-CM | POA: Diagnosis not present

## 2015-04-22 NOTE — Progress Notes (Addendum)
[name], [name] ([id number]) Visit Report for 04/22/2015 Arrival Information Details Patient Name: [name], [name] Date of Service: 04/22/2015 1:00 PM Medical Record Number: [id number] Patient Account Number: [id number] Date of Birth/Sex: 10/20/1920 (81 y.o. Female) Treating RN: Afful, RN, BSN, [place] Sink Primary Care Physician: Dorothey Baseman Other Clinician: Referring Physician: Terance Hart, DAVID Treating Physician/Extender: Rudene Re in Treatment: 10 Visit Information History Since Last Visit Any new allergies or adverse reactions: No Patient Arrived: Wheel Chair Had a fall or experienced change in No activities of daily living that may affect Arrival Time: 13:01 risk of falls: Accompanied By: dtr Signs or symptoms of abuse/neglect since last No Transfer Assistance: Manual visito Patient Identification Verified: Yes Hospitalized since last visit: No Secondary Verification Process Yes Has Dressing in Place as Prescribed: Yes Completed: Pain Present Now: No Patient Has Alerts: Yes Patient Alerts: DMII Electronic Signature(s) Signed: 04/22/2015 1:02:15 PM By: Elpidio Eric BSN, RN Entered By: Elpidio Eric on 04/22/2015 13:02:15 [name] ([id number]) -------------------------------------------------------------------------------- Encounter Discharge Information Details Patient Name: [name] Date of Service: 04/22/2015 1:00 PM Medical Record Number: [id number] Patient Account Number: [id number] Date of Birth/Sex: September 29, 1920 (81 y.o. Female) Treating RN: Afful, RN, BSN, [place] Sink Primary Care Physician: Dorothey Baseman Other Clinician: Referring Physician: Terance Hart DAVID Treating Physician/Extender: Rudene Re in Treatment: 10 Encounter Discharge Information Items Discharge Pain Level: 0 Discharge Condition: Stable Ambulatory Status: Wheelchair Discharge Destination: Home Private Transportation: Auto son, dtr Accompanied By: inlaw Schedule Follow-up  Appointment: No Medication Reconciliation completed and No provided to Patient/Care [place]: Clinical Summary of Care: Electronic Signature(s) Signed: 04/22/2015 1:31:34 PM By: Elpidio Eric BSN, RN Entered By: Elpidio Eric on 04/22/2015 13:31:34 [name] ([id number]) -------------------------------------------------------------------------------- Lower Extremity Assessment Details Patient Name: [name] Date of Service: 04/22/2015 1:00 PM Medical Record Number: [id number] Patient Account Number: [id number] Date of Birth/Sex: 12-Nov-1920 (81 y.o. Female) Treating RN: Afful, RN, BSN, [place] Sink Primary Care Physician: Terance Hart, DAVID Other Clinician: Referring Physician: Terance Hart, DAVID Treating Physician/Extender: Rudene Re in Treatment: 10 Edema Assessment Assessed: [Left: No] [Right: No] E[Left: dema] [Right: :] Calf Left: Right: Point of Measurement: 36 cm From Medial Instep cm 43 cm Ankle Left: Right: Point of Measurement: 8 cm From Medial Instep cm 25.4 cm Vascular Assessment Pulses: Posterior Tibial Dorsalis Pedis Palpable: [Right:No] Doppler: [Right:Multiphasic] Extremity colors, hair growth, and conditions: Extremity Color: [Right:Normal] Hair Growth on Extremity: [Right:Yes] Temperature of Extremity: [Right:Warm] Capillary Refill: [Right:< 3 seconds] Dependent Rubor: [Right:No] Blanched when Elevated: [Right:No] Lipodermatosclerosis: [Right:No] Toe Nail Assessment Left: Right: Thick: Yes Discolored: Yes Deformed: No Improper Length and Hygiene: No Electronic Signature(sDORATHEA, FAERBER ([id number]) Signed: 04/22/2015 1:12:47 PM By: Elpidio Eric BSN, RN Entered By: Elpidio Eric on 04/22/2015 13:12:47 [name] ([id number]) -------------------------------------------------------------------------------- Multi Wound Chart Details Patient Name: [name] Date of Service: 04/22/2015 1:00 PM Medical Record Number: [id number] Patient  Account Number: [id number] Date of Birth/Sex: [birth year] (81 y.o. Female) Treating RN: Clover Mealy, RN, BSN,  Sink Primary Care Physician: Dorothey Baseman Other Clinician: Referring Physician: Terance Hart, DAVID Treating Physician/Extender: Rudene Re in Treatment: 10 Vital Signs Height(in): 62 Pulse(bpm): 67 Weight(lbs): 155 Blood Pressure 134/47 (mmHg): Body Mass Index(BMI): 28 Temperature(F): 98 Respiratory Rate 18 (breaths/min): Photos: [1:No Photos] [2:No Photos] [3:No Photos] Wound Location: [1:Right Calcaneous] [2:Right Toe Third] [3:Left Amputation Site - Below Knee] Wounding Event: [1:Gradually Appeared] [2:Gradually Appeared] [3:Surgical Injury] 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] Date Acquired: [1:11/04/2014] [2:11/04/2014] [3:01/03/2015] Weeks of Treatment: [1:10] [2:10] [3:9] Wound Status: [1:Open] [2:Open] [3:Open] Measurements L x W x D  1.9x2x0.2 [2:1x0.9x0.1] [3:0.4x5x0.4] (cm) Area (cm) : [1:2.985] [2:0.707] [3:1.571] Volume (cm) : [1:0.597] [2:0.071] [3:0.628] % Reduction in Area: [1:45.70%] [2:24.90%] [3:84.80%] % Reduction in Volume: -8.50% [2:24.50%] [3:84.90%] Classification: [1:Full Thickness Without Exposed Support Structures] [2:Full Thickness Without Exposed Support Structures] [3:Full Thickness Without Exposed Support Structures] Periwound Skin Texture: No Abnormalities Noted No Abnormalities Noted No Abnormalities Noted Periwound Skin [1:No Abnormalities Noted No Abnormalities Noted No Abnormalities Noted] Moisture: Periwound Skin Color: No Abnormalities Noted No Abnormalities Noted No Abnormalities Noted Tenderness on [1:No] [2:No] [3:No] Wound Number: 4 5 N/A Photos: No Photos No Photos N/A Wound Location: Right, Medial Toe Third Right, Plantar Toe Fourth N/A Wounding Event: Gradually Appeared Gradually Appeared N/A Primary Etiology: Diabetic Wound/Ulcer of Diabetic Wound/Ulcer of N/A the  Lower Extremity the Lower Extremity [facility], [name] ([id number]) Date Acquired: 02/25/2015 03/24/2015 N/A Weeks of Treatment: 6 3 N/A Wound Status: Healed - Epithelialized Healed - Epithelialized N/A Measurements L x W x D 0x0x0 0x0x0 N/A (cm) Area (cm) : 0 0 N/A Volume (cm) : 0 0 N/A % Reduction in Area: 100.00% 100.00% N/A % Reduction in Volume: 100.00% 100.00% N/A Classification: Grade 2 Unable to visualize wound N/A bed Periwound Skin Texture: No Abnormalities Noted No Abnormalities Noted N/A Periwound Skin No Abnormalities Noted No Abnormalities Noted N/A Moisture: Periwound Skin Color: No Abnormalities Noted No Abnormalities Noted N/A Tenderness on No No N/A Palpation: Treatment Notes Electronic Signature(s) Signed: 04/22/2015 1:25:41 PM By: Elpidio Eric BSN, RN Entered By: Elpidio Eric on 04/22/2015 13:25:41 [name] ([id number]) -------------------------------------------------------------------------------- Multi-Disciplinary Care Plan Details Patient Name: [name] Date of Service: 04/22/2015 1:00 PM Medical Record Number: [id number] Patient Account Number: [id number] Date of Birth/Sex: 1921-05-22 (81 y.o. Female) Treating RN: Afful, RN, BSN, [place] Sink Primary Care Physician: Terance Hart, DAVID Other Clinician: Referring Physician: Terance Hart, DAVID Treating Physician/Extender: Rudene Re in Treatment: 10 Active Inactive Abuse / Safety / Falls / Self Care Management Nursing Diagnoses: Impaired physical mobility Potential for falls Goals: Patient will remain injury free Date Initiated: 02/11/2015 Goal Status: Active Patient/caregiver will verbalize understanding of skin care regimen Date Initiated: 02/11/2015 Goal Status: Active Patient/caregiver will verbalize/demonstrate measures taken to prevent injury and/or falls Date Initiated: 02/11/2015 Goal Status: Active Patient/caregiver will verbalize/demonstrate understanding of what to do in case of  emergency Date Initiated: 02/11/2015 Goal Status: Active Interventions: Assess fall risk on admission and as needed Provide education on fall prevention Provide education on safe transfers Treatment Activities: Patient referred to home care : 04/22/2015 Notes: Nutrition Nursing Diagnoses: Imbalanced nutrition Potential for alteratiion in Nutrition/Potential for imbalanced nutrition Yeakel, Celinda ([id number]) Goals: Patient/caregiver verbalizes understanding of need to maintain therapeutic glucose control per primary care physician Date Initiated: 02/11/2015 Goal Status: Active Patient/caregiver will maintain therapeutic glucose control Date Initiated: 02/11/2015 Goal Status: Active Interventions: Assess HgA1c results as ordered upon admission and as needed Provide education on elevated blood sugars and impact on wound healing Provide education on nutrition Treatment Activities: Obtain HgA1c : 04/22/2015 Notes: Wound/Skin Impairment Nursing Diagnoses: Impaired tissue integrity Knowledge deficit related to ulceration/compromised skin integrity Goals: Patient/caregiver will verbalize understanding of skin care regimen Date Initiated: 02/11/2015 Goal Status: Active Ulcer/skin breakdown will heal within 14 weeks Date Initiated: 02/11/2015 Goal Status: Active Interventions: Assess patient/caregiver ability to obtain necessary supplies Assess patient/caregiver ability to perform ulcer/skin care regimen upon admission and as needed Assess ulceration(s) every visit Provide education on ulcer and skin care Treatment Activities: Skin care regimen initiated : 04/22/2015 Topical wound management initiated : 04/22/2015 Notes: BRUCE, MAYERS ([id number])  Electronic Signature(s) Signed: 04/22/2015 1:25:27 PM By: Elpidio Eric BSN, RN Entered By: Elpidio Eric on 04/22/2015 13:25:27 [name]  ([id number]) -------------------------------------------------------------------------------- Pain Assessment Details Patient Name: [name] Date of Service: 04/22/2015 1:00 PM Medical Record Number: [id number] Patient Account Number: [id number] Date of Birth/Sex: Oct 07, 1920 (81 y.o. Female) Treating RN: Clover Mealy, RN, BSN, [place] Sink Primary Care Physician: Dorothey Baseman Other Clinician: Referring Physician: Dorothey Baseman Treating Physician/Extender: Rudene Re in Treatment: 10 Active Problems Location of Pain Severity and Description of Pain Patient Has Paino No Site Locations Pain Management and Medication Current Pain Management: Electronic Signature(s) Signed: 04/22/2015 1:02:28 PM By: Elpidio Eric BSN, RN Entered By: Elpidio Eric on 04/22/2015 13:02:28 [name] ([id number]) -------------------------------------------------------------------------------- Patient/Caregiver Education Details Patient Name: [name] Date of Service: 04/22/2015 1:00 PM Medical Record Number: [id number] Patient Account Number: [id number] Date of Birth/Gender: 21-Nov-1920 (81 y.o. Female) Treating RN: Afful, RN, BSN, [place] Sink Primary Care Physician: Dorothey Baseman Other Clinician: Referring Physician: Terance Hart DAVID Treating Physician/Extender: Rudene Re in Treatment: 10 Education Assessment Education Provided To: Patient and Caregiver Education Topics Provided Elevated Blood Sugar/ Impact on Healing: Methods: Explain/Verbal Responses: State content correctly Nutrition: Methods: Explain/Verbal Responses: State content correctly Safety: Methods: Explain/Verbal Responses: State content correctly Wound/Skin Impairment: Methods: Explain/Verbal Responses: State content correctly Electronic Signature(s) Signed: 04/22/2015 1:31:56 PM By: Elpidio Eric BSN, RN Entered By: Elpidio Eric on 04/22/2015 13:31:55 [name]  ([id number]) -------------------------------------------------------------------------------- Wound Assessment Details Patient Name: [name] Date of Service: 04/22/2015 1:00 PM Medical Record Number: [id number] Patient Account Number: [id number] Date of Birth/Sex: 01/05/1921 (81 y.o. Female) Treating RN: Afful, RN, BSN, [place] Sink Primary Care Physician: Terance Hart, DAVID Other Clinician: Referring Physician: Terance Hart, DAVID Treating Physician/Extender: Rudene Re in Treatment: 10 Wound Status Wound Number: 1 Primary Etiology: Arterial Insufficiency Ulcer Wound Location: Right Calcaneous Wound Status: Open Wounding Event: Gradually Appeared Date Acquired: 11/04/2014 Weeks Of Treatment: 10 Clustered Wound: No Photos Photo Uploaded By: Elpidio Eric on 04/22/2015 16:18:23 Wound Measurements Length: (cm) 1.9 Width: (cm) 2 Depth: (cm) 0.2 Area: (cm) 2.985 Volume: (cm) 0.597 % Reduction in Area: 45.7% % Reduction in Volume: -8.5% Wound Description Full Thickness Without Exposed Classification: Support Structures Periwound Skin Texture Texture Color No Abnormalities Noted: No No Abnormalities Noted: No Moisture No Abnormalities Noted: No Treatment Notes Wound #1 (Right Calcaneous) [place]olphin, Izabela ([id number]) 1. Cleansed with: Clean wound with Normal Saline 2. Anesthetic Topical Lidocaine 4% cream to wound bed prior to debridement 4. Dressing Applied: Santyl Ointment Plain packing gauze Other dressing (specify in notes) 5. Secondary Dressing Applied Bordered Foam Dressing Electronic Signature(s) Signed: 04/22/2015 4:25:16 PM By: Elpidio Eric BSN, RN Entered By: Elpidio Eric on 04/22/2015 13:17:29 [name] ([id number]) -------------------------------------------------------------------------------- Wound Assessment Details Patient Name: [name] Date of Service: 04/22/2015 1:00 PM Medical Record Number: [id number] Patient Account Number:  [id number] Date of Birth/Sex: [birth year] (81 y.o. Female) Treating RN: Afful, RN, BSN, [place] Sink Primary Care Physician: Terance Hart, DAVID Other Clinician: Referring Physician: Terance Hart, DAVID Treating Physician/Extender: Rudene Re in Treatment: 10 Wound Status Wound Number: 2 Primary Etiology: Arterial Insufficiency Ulcer Wound Location: Right Toe Third Wound Status: Open Wounding Event: Gradually Appeared Date Acquired: 11/04/2014 Weeks Of Treatment: 10 Clustered Wound: No Photos Photo Uploaded By: Elpidio Eric on 04/22/2015 16:18:24 Wound Measurements Length: (cm) 1 Width: (cm) 0.9 Depth: (cm) 0.1 Area: (cm) 0.707 Volume: (cm) 0.071 % Reduction in Area: 24.9% % Reduction in Volume: 24.5% Wound Description Full Thickness Without Exposed Classification: Support Structures Periwound Skin Texture Texture Color No Abnormalities Noted: No  No Abnormalities Noted: No Moisture No Abnormalities Noted: No Treatment Notes Wound #2 (Right Toe Third) Chaviano, Mileidy ([id number]) 1. Cleansed with: Clean wound with Normal Saline 2. Anesthetic Topical Lidocaine 4% cream to wound bed prior to debridement 4. Dressing Applied: Santyl Ointment Plain packing gauze Other dressing (specify in notes) 5. Secondary Dressing Applied Bordered Foam Dressing Electronic Signature(s) Signed: 04/22/2015 4:25:16 PM By: Elpidio Eric BSN, RN Entered By: Elpidio Eric on 04/22/2015 13:17:30 [name] ([id number]) -------------------------------------------------------------------------------- Wound Assessment Details Patient Name: [name] Date of Service: 04/22/2015 1:00 PM Medical Record Number: [id number] Patient Account Number: [id number] Date of Birth/Sex: 08-Sep-1921 (81 y.o. Female) Treating RN: Afful, RN, BSN, [place] Primary Care Physician: Terance Hart, DAVID Other Clinician: Referring Physician: Terance Hart, DAVID Treating Physician/Extender: Rudene Re in Treatment:  10 Wound Status Wound Number: 3 Primary Etiology: Dehisced Wound Wound Location: Left Amputation Site - Below Wound Status: Open Knee Wounding Event: Surgical Injury Date Acquired: 01/03/2015 Weeks Of Treatment: 9 Clustered Wound: No Photos Photo Uploaded By: Elpidio Eric on 04/22/2015 16:18:24 Wound Measurements Length: (cm) 0.4 Width: (cm) 5 Depth: (cm) 0.4 Area: (cm) 1.571 Volume: (cm) 0.628 % Reduction in Area: 84.8% % Reduction in Volume: 84.9% Wound Description Full Thickness Without Exposed Classification: Support Structures Periwound Skin Texture Texture Color No Abnormalities Noted: No No Abnormalities Noted: No Moisture No Abnormalities Noted: No Treatment Notes Esper, Doriann ([id number]) Wound #3 (Left Amputation Site - Below Knee) 1. Cleansed with: Clean wound with Normal Saline 2. Anesthetic Topical Lidocaine 4% cream to wound bed prior to debridement 4. Dressing Applied: Santyl Ointment Plain packing gauze Other dressing (specify in notes) 5. Secondary Dressing Applied Bordered Foam Dressing Electronic Signature(s) Signed: 04/22/2015 4:25:16 PM By: Elpidio Eric BSN, RN Entered By: Elpidio Eric on 04/22/2015 13:17:30 [name] ([id number]) -------------------------------------------------------------------------------- Wound Assessment Details Patient Name: [name] Date of Service: 04/22/2015 1:00 PM Medical Record Number: [id number] Patient Account Number: [id number] Date of Birth/Sex: 08-20-1921 (81 y.o. Female) Treating RN: Afful, RN, BSN, [place] Sink Primary Care Physician: Terance Hart, DAVID Other Clinician: Referring Physician: Terance Hart, DAVID Treating Physician/Extender: Rudene Re in Treatment: 10 Wound Status Wound Number: 4 Primary Diabetic Wound/Ulcer of the Lower Etiology: Extremity Wound Location: Right, Medial Toe Third Wound Status: Healed - Epithelialized Wounding Event: Gradually Appeared Date Acquired:  02/25/2015 Weeks Of Treatment: 6 Clustered Wound: No Photos Photo Uploaded By: Elpidio Eric on 04/22/2015 16:19:31 Wound Measurements Length: (cm) 0 Width: (cm) 0 Depth: (cm) 0 Area: (cm) 0 Volume: (cm) 0 % Reduction in Area: 100% % Reduction in Volume: 100% Wound Description Classification: Grade 2 Periwound Skin Texture Texture Color No Abnormalities Noted: No No Abnormalities Noted: No Moisture No Abnormalities Noted: No Electronic Signature(s) Signed: 04/22/2015 4:25:16 PM By: Elpidio Eric BSN, RN [place], Lailany ([id number]) Entered By: Elpidio Eric on 04/22/2015 13:17:31 [name] ([id number]) -------------------------------------------------------------------------------- Wound Assessment Details Patient Name: [name] Date of Service: 04/22/2015 1:00 PM Medical Record Number: [id number] Patient Account Number: [id number] Date of Birth/Sex: Jun 27, 1921 (81 y.o. Female) Treating RN: Afful, RN, BSN, [place] Sink Primary Care Physician: Dorothey Baseman Other Clinician: Referring Physician: Terance Hart, DAVID Treating Physician/Extender: Rudene Re in Treatment: 10 Wound Status Wound Number: 5 Primary Diabetic Wound/Ulcer of the Lower Etiology: Extremity Wound Location: Right, Plantar Toe Fourth Wound Status: Healed - Epithelialized Wounding Event: Gradually Appeared Date Acquired: 03/24/2015 Weeks Of Treatment: 3 Clustered Wound: No Photos Photo Uploaded By: Elpidio Eric on 04/22/2015 16:19:31 Wound Measurements Length: (cm) 0 % Reductio Width: (cm) 0 % Reductio Depth: (cm) 0 Area: (cm)  0 Volume: (cm) 0 n in Area: 100% n in Volume: 100% Wound Description Classification: Unable to visualize wound bed Periwound Skin Texture Texture Color No Abnormalities Noted: No No Abnormalities Noted: No Moisture No Abnormalities Noted: No Electronic Signature(s) Signed: 04/22/2015 4:25:16 PM By: Elpidio Eric BSN, RN [place], Zahniya ([id number]) Entered By:  Elpidio Eric on 04/22/2015 13:17:32 [name] ([id number]) -------------------------------------------------------------------------------- Vitals Details Patient Name: [name] Date of Service: 04/22/2015 1:00 PM Medical Record Number: [id number] Patient Account Number: [id number] Date of Birth/Sex: 02-25-1921 (81 y.o. Female) Treating RN: Afful, RN, BSN, [place] Sink Primary Care Physician: Terance Hart, DAVID Other Clinician: Referring Physician: Terance Hart, DAVID Treating Physician/Extender: Rudene Re in Treatment: 10 Vital Signs Time Taken: 13:07 Temperature (F): 98 Height (in): 62 Pulse (bpm): 67 Weight (lbs): 155 Respiratory Rate (breaths/min): 18 Body Mass Index (BMI): 28.3 Blood Pressure (mmHg): 134/47 Reference Range: 80 - 120 mg / dl Electronic Signature(s) Signed: 04/22/2015 1:08:07 PM By: Elpidio Eric BSN, RN Entered By: Elpidio Eric on 04/22/2015 13:08:07

## 2015-04-23 NOTE — Progress Notes (Signed)
JANAISA, BIRKLAND ([id number]) Visit Report for 04/22/2015 Chief Complaint Document Details Patient Name: [name], [name] Date of Service: 04/22/2015 1:00 PM Medical Record Number: [id number] Patient Account Number: [id number] Date of Birth/Sex: Dec 01, 1920 (81 y.o. Female) Treating RN: Clover Mealy, RN, BSN, [place] Sink Primary Care Physician: Dorothey Baseman Other Clinician: Referring Physician: Dorothey Baseman Treating Physician/Extender: Rudene Re in Treatment: 10 Information Obtained from: Patient Chief Complaint Patient presents to the wound care center for a consult due non healing wound. 81 year old patient with comes with a history of a ulcerated area to the right third toe and the right heel which she's had for about 2 months. Electronic Signature(s) Signed: 04/22/2015 1:57:19 PM By: Evlyn Kanner MD, FACS Entered By: Evlyn Kanner on 04/22/2015 13:57:19 [name] ([id number]) -------------------------------------------------------------------------------- Debridement Details Patient Name: [name] Date of Service: 04/22/2015 1:00 PM Medical Record Number: [id number] Patient Account Number: [id number] Date of Birth/Sex: Dec 01, 1920 (81 y.o. Female) Treating RN: Afful, RN, BSN, [place] Sink Primary Care Physician: Terance Hart, DAVID Other Clinician: Referring Physician: Terance Hart, DAVID Treating Physician/Extender: Rudene Re in Treatment: 10 Debridement Performed for Wound #1 Right Calcaneous Assessment: Performed By: Physician Tristan Schroeder., MD Debridement: Debridement Pre-procedure Yes Verification/Time Out Taken: Start Time: 13:26 Pain Control: Lidocaine 4% Topical Solution Level: Skin/Subcutaneous Tissue Total Area Debrided (L x 1.9 (cm) x 2 (cm) = 3.8 (cm) W): Tissue and other Non-Viable, Exudate, Fibrin/Slough, Subcutaneous material debrided: Instrument: Curette Bleeding: Minimum Hemostasis Achieved: Pressure End Time: 13:29 Procedural Pain: 0 Post  Procedural Pain: 0 Response to Treatment: Procedure was tolerated well Post Debridement Measurements of Total Wound Length: (cm) 1.9 Width: (cm) 2 Depth: (cm) 0.2 Volume: (cm) 0.597 Electronic Signature(s) Signed: 04/22/2015 1:57:02 PM By: Evlyn Kanner MD, FACS Signed: 04/22/2015 4:25:16 PM By: Elpidio Eric BSN, RN Previous Signature: 04/22/2015 1:29:01 PM Version By: Elpidio Eric BSN, RN Entered By: Evlyn Kanner on 04/22/2015 13:57:02 [name] ([id number]) -------------------------------------------------------------------------------- Debridement Details Patient Name: [name] Date of Service: 04/22/2015 1:00 PM Medical Record Number: [id number] Patient Account Number: [id number] Date of Birth/Sex: Jul 03, 1921 (81 y.o. Female) Treating RN: Afful, RN, BSN, [place] Sink Primary Care Physician: Terance Hart, DAVID Other Clinician: Referring Physician: Terance Hart, DAVID Treating Physician/Extender: Rudene Re in Treatment: 10 Debridement Performed for Wound #3 Left Amputation Site - Below Knee Assessment: Performed By: Physician Tristan Schroeder., MD Debridement: Debridement Pre-procedure Yes Verification/Time Out Taken: Start Time: 13:29 Pain Control: Lidocaine 4% Topical Solution Level: Skin/Subcutaneous Tissue Total Area Debrided (L x 0.4 (cm) x 5 (cm) = 2 (cm) W): Tissue and other Viable, Non-Viable, Exudate, Fibrin/Slough, Subcutaneous material debrided: Instrument: Curette Bleeding: Minimum Hemostasis Achieved: Pressure End Time: 13:30 Procedural Pain: 0 Post Procedural Pain: 0 Response to Treatment: Procedure was tolerated well Post Debridement Measurements of Total Wound Length: (cm) 0.4 Width: (cm) 5 Depth: (cm) 0.4 Volume: (cm) 0.628 Electronic Signature(s) Signed: 04/22/2015 1:57:13 PM By: Evlyn Kanner MD, FACS Signed: 04/22/2015 4:25:16 PM By: Elpidio Eric BSN, RN Previous Signature: 04/22/2015 1:29:53 PM Version By: Elpidio Eric BSN, RN Entered By:  Evlyn Kanner on 04/22/2015 13:57:13 [name] ([id number]) -------------------------------------------------------------------------------- HPI Details Patient Name: [name] Date of Service: 04/22/2015 1:00 PM Medical Record Number: [id number] Patient Account Number: [id number] Date of Birth/Sex: 03-17-1921 (81 y.o. Female) Treating RN: Afful, RN, BSN, [place] Sink Primary Care Physician: Dorothey Baseman Other Clinician: Referring Physician: Terance Hart, DAVID Treating Physician/Extender: Rudene Re in Treatment: 10 History of Present Illness Location: right medial heel and right third toe Quality: Patient reports experiencing a dull pain to affected area(s). Severity: Patient states  wound are getting worse. Duration: Patient has had the wound for > 3 months prior to seeking treatment at the wound center Timing: Pain in wound is Intermittent (comes and goes Context: The wound appeared gradually over time Modifying Factors: Consults to this date include:vascular surgeon and several recent surgeries. Associated Signs and Symptoms: Patient reports having foul odor and drainage from the left below-knee amputation site. HPI Description: A pleasant 81 year old patient who is known to have diabetes mellitus for several years has recently gone through a series of operations with the vascular surgeon Dr. Gilda Crease. In January and February she had several surgeries on the left lower extremity with an attempt to have limb salvage for gangrenous changes of her forefoot. 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 On 01/07/2015 she also had a right lower extremity distal runoff with a angioplasty of the right anterior tibial artery to maximize her blood flow to the foot. She recently had her staples removed at Dr. Marijean Heath office on 01/31/2015 and at that time a right lower extremity duplex was done which showed  patent vessels and a patent stent with distal occluded posterior tibial artery. Though the duplex was noncritical the recommendations from the PA at the vascular surgery office was that of a angiogram to be done but the patient said she would rather try some bone care before. The patient has received doxycycline and Cipro in the recent past and she takes oral medications for her diabetes. Other than that I reviewed her list of all her medications. No recent hemoglobin A1c has been done and no recent x-rays of the right foot have been done. 02/18/2015 -- x-ray of the right foot was done on 02/11/2015 and it shows no evidence of acute osteomyelitis of the third toe or of the calcaneus. She had gone to the vascular surgery office and they had noted that there is dehiscence of the left part of the amputation site of the below-knee wound and they have asked [place] to kindly take over the care of this. There've been doing dressings for the right heel and right third toe. 02/25/2015 -- we have received notes from the vascular group who saw her last on 02/13/2015 and she was seen by the PA Ms. Cleda Daub. She had recommended that the patient continue to follow with [place] for wound care including management of the left BKA stump, which has had dehiscence of the lateral part. They will consider repeating a arterial duplex or angiogram if the right lower extremity does not heal within a reasonable period of time. 03/18/2015 -- he saw the vascular surgeon Dr. Gilda Crease and he has set her up for an angioplasty sometime in the middle of July. she is doing well otherwise. [name], [name] ([id number]) 04/10/2015 -- the patient had a procedure done on 04/08/2015 and this was a right angioplasty of the dorsalis pedis, anterior tibial artery, first superficial femoral artery in its midportion. There was successful intervention with recanalization and in-line flow to the right foot. 04/22/2015 -- the patient was  looking rather pale today and the daughter confirms that her hemoglobin was down to 6.9. she is being monitored by her PCP. Her Lasix dose was increased and her edema has gone down significantly. Electronic Signature(s) Signed: 04/22/2015 1:58:44 PM By: Evlyn Kanner MD, FACS Entered By: Evlyn Kanner on 04/22/2015 13:58:44 [name] ([id number]) -------------------------------------------------------------------------------- Physical Exam Details Patient Name: [name] Date of Service: 04/22/2015 1:00 PM  Medical Record Number: [id number] Patient Account Number: [id number] Date of Birth/Sex: 1921/01/02 (81 y.o. Female) Treating RN: Clover Mealy, RN, BSN, [place] Sink Primary Care Physician: Dorothey Baseman Other Clinician: Referring Physician: Terance Hart, DAVID Treating Physician/Extender: Rudene Re in Treatment: 10 Constitutional . Pulse regular. Respirations normal and unlabored. Afebrile. . Eyes Nonicteric. Reactive to light. Ears, Nose, Mouth, and Throat Lips, teeth, and gums WNL.Marland Kitchen Moist mucosa without lesions . Neck supple and nontender. No palpable supraclavicular or cervical adenopathy. Normal sized without goiter. Respiratory WNL. No retractions.. Cardiovascular Pedal Pulses WNL. No clubbing, cyanosis or edema. Lymphatic No adneopathy. No adenopathy. No adenopathy. Musculoskeletal Adexa without tenderness or enlargement.. Digits and nails w/o clubbing, cyanosis, infection, petechiae, ischemia, or inflammatory conditions.. Integumentary (Hair, Skin) No suspicious lesions. No crepitus or fluctuance. No peri-wound warmth or erythema. No masses.Marland Kitchen Psychiatric Judgement and insight Intact.. No evidence of depression, anxiety, or agitation.. Notes The wounds on her right toe's are looking much better and two have been healed out. The one on the third toe has minimal slough. The right heel is looking cleaner too. the stump of the left BKA is nicely healing and the debris  is minimal. Electronic Signature(s) Signed: 04/22/2015 2:00:18 PM By: Evlyn Kanner MD, FACS Entered By: Evlyn Kanner on 04/22/2015 14:00:18 [name] ([id number]) -------------------------------------------------------------------------------- Physician Orders Details Patient Name: [name] Date of Service: 04/22/2015 1:00 PM Medical Record Number: [id number] Patient Account Number: [id number] Date of Birth/Sex: 20-Aug-1921 (81 y.o. Female) Treating RN: Clover Mealy, RN, BSN, [place] Sink Primary Care Physician: Dorothey Baseman Other Clinician: Referring Physician: Terance Hart DAVID Treating Physician/Extender: Rudene Re in Treatment: 10 Verbal / Phone Orders: Yes Clinician: Afful, RN, BSN, Rita Read Back and Verified: Yes Diagnosis Coding Wound Cleansing Wound #1 Right Calcaneous o Clean wound with Normal Saline. Wound #2 Right Toe Third o Clean wound with Normal Saline. Wound #3 Left Amputation Site - Below Knee o Clean wound with Normal Saline. Anesthetic Wound #1 Right Calcaneous o Topical Lidocaine 4% cream applied to wound bed prior to debridement Wound #2 Right Toe Third o Topical Lidocaine 4% cream applied to wound bed prior to debridement Wound #3 Left Amputation Site - Below Knee o Topical Lidocaine 4% cream applied to wound bed prior to debridement Primary Wound Dressing Wound #1 Right Calcaneous o Santyl Ointment Wound #2 Right Toe Third o Santyl Ointment Wound #3 Left Amputation Site - Below Knee o Santyl Ointment o Plain packing gauze Secondary Dressing Wound #1 Right Calcaneous o Boardered Foam Dressing o XtraSorb - to weeping skin and over wound Wound #2 Right Toe Third [name], [name] ([id number]) o Gauze and Kerlix/Conform Wound #3 Left Amputation Site - Below Knee o Boardered Foam Dressing o XtraSorb - to weeping skin and over wound Dressing Change Frequency Wound #1 Right Calcaneous o Change dressing every  day. Wound #2 Right Toe Third o Change dressing every day. Wound #3 Left Amputation Site - Below Knee o Change dressing every day. Follow-up Appointments Wound #1 Right Calcaneous o Return Appointment in 1 week. Wound #2 Right Toe Third o Return Appointment in 1 week. Wound #3 Left Amputation Site - Below Knee o Return Appointment in 1 week. Home Health Wound #1 Right Calcaneous o Continue Home Health Visits - [facility] o Home Health Nurse may visit PRN to address patientos wound care needs. o FACE TO FACE ENCOUNTER: MEDICARE and MEDICAID PATIENTS: I certify that this patient is under my care and that I had a face-to-face encounter that meets the physician face-to-face encounter requirements with this  patient on this date. The encounter with the patient was in whole or in part for the following MEDICAL CONDITION: (primary reason for Home Healthcare) MEDICAL NECESSITY: I certify, that based on my findings, NURSING services are a medically necessary home health service. HOME BOUND STATUS: I certify that my clinical findings support that this patient is homebound (i.e., Due to illness or injury, pt requires aid of supportive devices such as crutches, cane, wheelchairs, walkers, the use of special transportation or the assistance of another person to leave their place of residence. There is a normal inability to leave the home and doing so requires considerable and taxing effort. Other absences are for medical reasons / religious services and are infrequent or of short duration when for other reasons). o If current dressing causes regression in wound condition, may D/C ordered dressing product/s and apply Normal Saline Moist Dressing daily until next Wound Healing Center / Other MD appointment. Notify Wound Healing Center of regression in wound condition at [phone]. o Please direct any NON-WOUND related issues/requests for orders to patient's Primary  Care Physician [name], [name] ([id number]) Wound #2 Right Toe Third o Continue Home Health Visits - [facility] o Home Health Nurse may visit PRN to address patientos wound care needs. o FACE TO FACE ENCOUNTER: MEDICARE and MEDICAID PATIENTS: I certify that this patient is under my care and that I had a face-to-face encounter that meets the physician face-to-face encounter requirements with this patient on this date. The encounter with the patient was in whole or in part for the following MEDICAL CONDITION: (primary reason for Home Healthcare) MEDICAL NECESSITY: I certify, that based on my findings, NURSING services are a medically necessary home health service. HOME BOUND STATUS: I certify that my clinical findings support that this patient is homebound (i.e., Due to illness or injury, pt requires aid of supportive devices such as crutches, cane, wheelchairs, walkers, the use of special transportation or the assistance of another person to leave their place of residence. There is a normal inability to leave the home and doing so requires considerable and taxing effort. Other absences are for medical reasons / religious services and are infrequent or of short duration when for other reasons). o If current dressing causes regression in wound condition, may D/C ordered dressing product/s and apply Normal Saline Moist Dressing daily until next Wound Healing Center / Other MD appointment. Notify Wound Healing Center of regression in wound condition at [phone]. o Please direct any NON-WOUND related issues/requests for orders to patient's Primary Care Physician Wound #3 Left Amputation Site - Below Knee o Continue Home Health Visits - [facility] o Home Health Nurse may visit PRN to address patientos wound care needs. o FACE TO FACE ENCOUNTER: MEDICARE and MEDICAID PATIENTS: I certify that this patient is under my care and that I had a face-to-face encounter that meets the  physician face-to-face encounter requirements with this patient on this date. The encounter with the patient was in whole or in part for the following MEDICAL CONDITION: (primary reason for Home Healthcare) MEDICAL NECESSITY: I certify, that based on my findings, NURSING services are a medically necessary home health service. HOME BOUND STATUS: I certify that my clinical findings support that this patient is homebound (i.e., Due to illness or injury, pt requires aid of supportive devices such as crutches, cane, wheelchairs, walkers, the use of special transportation or the assistance of another person to leave their place of residence. There is a normal inability to leave the home and doing  so requires considerable and taxing effort. Other absences are for medical reasons / religious services and are infrequent or of short duration when for other reasons). o If current dressing causes regression in wound condition, may D/C ordered dressing product/s and apply Normal Saline Moist Dressing daily until next Wound Healing Center / Other MD appointment. Notify Wound Healing Center of regression in wound condition at [phone]. o Please direct any NON-WOUND related issues/requests for orders to patient's Primary Care Physician Electronic Signature(s) Signed: 04/22/2015 1:30:35 PM By: Elpidio Eric BSN, RN Signed: 04/22/2015 3:54:23 PM By: Evlyn Kanner MD, FACS Entered By: Elpidio Eric on 04/22/2015 13:30:34 [name] ([id number]) [place], Eyvonne Left ([id number]) -------------------------------------------------------------------------------- Problem List Details Patient Name: [name] Date of Service: 04/22/2015 1:00 PM Medical Record Number: [id number] Patient Account Number: [id number] Date of Birth/Sex: 04/13/1921 (81 y.o. 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/11/2015 Yes fat layer exposed L97.812 Non-pressure chronic ulcer of other part of right lower leg 02/11/2015 Yes with fat layer exposed T81.31XA Disruption of external operation (surgical) wound, not 02/18/2015 Yes elsewhere classified, initial encounter Inactive Problems Resolved Problems Electronic Signature(s) Signed: 04/22/2015 1:56:49 PM By: Evlyn Kanner MD, FACS Entered By: Evlyn Kanner on 04/22/2015 13:56:49 [name] ([id number]) -------------------------------------------------------------------------------- Progress Note Details Patient Name: [name] Date of Service: 04/22/2015 1:00 PM Medical Record Number: [id number] Patient Account Number: [id number] Date of Birth/Sex: 1921-07-17 (81 y.o. Female) Treating RN: Clover Mealy, RN, BSN, [place] Sink Primary Care Physician: Dorothey Baseman Other Clinician: Referring Physician: Dorothey Baseman Treating Physician/Extender: Rudene Re in Treatment: 10 Subjective Chief Complaint Information obtained from Patient Patient presents to the wound care center for a consult due non healing wound. 81 year old patient with comes with a history of a ulcerated area to the right third toe and the right heel which she's had for about 2 months. History of Present Illness (HPI) The following HPI elements were documented for the patient's wound: Location: right medial heel and right third toe Quality: Patient reports experiencing a dull pain to affected area(s). Severity: Patient states wound are getting  worse. Duration: Patient has had the wound for > 3 months prior to seeking treatment at the wound center Timing: Pain in wound is Intermittent (comes and goes Context: The wound appeared gradually over time Modifying Factors: Consults to this date include:vascular surgeon and several recent surgeries. Associated Signs and Symptoms: Patient reports having foul odor and drainage from the left below-knee amputation site. A pleasant 81 year old patient who is known to have diabetes mellitus for several years has recently gone through a series of operations with the vascular surgeon Dr. Gilda Crease. In January and February she had several surgeries on the left lower extremity with an attempt to have limb salvage for gangrenous changes of her forefoot. 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 On 01/07/2015 she also had a right lower extremity distal runoff with a angioplasty of the right anterior tibial artery to maximize her blood flow to the foot. She recently had her staples removed  at Dr. Marijean Heath office on 01/31/2015 and at that time a right lower extremity duplex was done which showed patent vessels and a patent stent with distal occluded posterior tibial artery. Though the duplex was noncritical the recommendations from the PA at the vascular surgery office was that of a angiogram to be done but the patient said she would rather try some bone care before. The patient has received doxycycline and Cipro in the recent past and she takes oral medications for her diabetes. Other than that I reviewed her list of all her medications. No recent hemoglobin A1c has been done and no recent x-rays of the right foot have been done. 02/18/2015 -- x-ray of the right foot was done on 02/11/2015 and it shows no evidence of acute osteomyelitis of the third toe or of the calcaneus. She had gone to the vascular surgery office and they  had noted that there is dehiscence of the left part of the amputation site of the below-knee wound and they have asked [place] to kindly take over the care of this. There've been doing dressings for the right heel and right third toe. MAKAYLI, BRACKEN ([id number]) 02/25/2015 -- we have received notes from the vascular group who saw her last on 02/13/2015 and she was seen by the PA Ms. Cleda Daub. She had recommended that the patient continue to follow with [place] for wound care including management of the left BKA stump, which has had dehiscence of the lateral part. They will consider repeating a arterial duplex or angiogram if the right lower extremity does not heal within a reasonable period of time. 03/18/2015 -- he saw the vascular surgeon Dr. Gilda Crease and he has set her up for an angioplasty sometime in the middle of July. she is doing well otherwise. 04/10/2015 -- the patient had a procedure done on 04/08/2015 and this was a right angioplasty of the dorsalis pedis, anterior tibial artery, first superficial femoral artery in its midportion. There was successful intervention with recanalization and in-line flow to the right foot. 04/22/2015 -- the patient was looking rather pale today and the daughter confirms that her hemoglobin was down to 6.9. she is being monitored by her PCP. Her Lasix dose was increased and her edema has gone down significantly. Objective Constitutional Pulse regular. Respirations normal and unlabored. Afebrile. Vitals Time Taken: 1:07 PM, Height: 62 in, Weight: 155 lbs, BMI: 28.3, Temperature: 98 F, Pulse: 67 bpm, Respiratory Rate: 18 breaths/min, Blood Pressure: 134/47 mmHg. Eyes Nonicteric. Reactive to light. Ears, Nose, Mouth, and Throat Lips, teeth, and gums WNL.Marland Kitchen Moist mucosa without lesions . Neck supple and nontender. No palpable supraclavicular or cervical adenopathy. Normal sized without goiter. Respiratory WNL. No retractions.. Cardiovascular Pedal  Pulses WNL. No clubbing, cyanosis or edema. Lymphatic No adneopathy. No adenopathy. No adenopathy. [name], [name] ([id number]) Musculoskeletal Adexa without tenderness or enlargement.. Digits and nails w/o clubbing, cyanosis, infection, petechiae, ischemia, or inflammatory conditions.Marland Kitchen Psychiatric Judgement and insight Intact.. No evidence of depression, anxiety, or agitation.. General Notes: The wounds on her right toe's are looking much better and two have been healed out. The one on the third toe has minimal slough. The right heel is looking cleaner too. the stump of the left BKA is nicely healing and the debris is minimal. Integumentary (Hair, Skin) No suspicious lesions. No crepitus or fluctuance. No peri-wound warmth or erythema. No masses.. Wound #1 status is Open. Original cause of wound was Gradually Appeared. The wound is located on the Right Calcaneous. The wound measures 1.9cm  length x 2cm width x 0.2cm depth; 2.985cm^2 area and 0.597cm^3 volume. Wound #2 status is Open. Original cause of wound was Gradually Appeared. The wound is located on the Right Toe Third. The wound measures 1cm length x 0.9cm width x 0.1cm depth; 0.707cm^2 area and 0.071cm^3 volume. Wound #3 status is Open. Original cause of wound was Surgical Injury. The wound is located on the Left Amputation Site - Below Knee. The wound measures 0.4cm length x 5cm width x 0.4cm depth; 1.571cm^2 area and 0.628cm^3 volume. Wound #4 status is Healed - Epithelialized. Original cause of wound was Gradually Appeared. The wound is located on the Right,Medial Toe Third. The wound measures 0cm length x 0cm width x 0cm depth; 0cm^2 area and 0cm^3 volume. Wound #5 status is Healed - Epithelialized. Original cause of wound was Gradually Appeared. The wound is located on the Right,Plantar Toe Fourth. The wound measures 0cm length x 0cm width x 0cm depth; 0cm^2 area and 0cm^3 volume. 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 They are keeping a close watch on her hemoglobin and I have also recommended that her K+ should be checked because of the Lasix. She will come back and see me next week. 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 There was a Skin/Subcutaneous Tissue Debridement (40981-19147) debridement with total area of 3.8 sq cm performed by Tristan Schroeder., MD. with the following instrument(s): Curette to remove Non-Viable tissue/material including Exudate, Fibrin/Slough, and Subcutaneous after achieving pain control using Lidocaine 4% Topical Solution. A time out was conducted prior to the start of the procedure. A Minimum amount of bleeding was controlled with Pressure. The procedure was tolerated well with a pain level of 0 throughout and a pain level of 0 following the procedure. Post Debridement Measurements: 1.9cm length x 2cm width x 0.2cm depth; 0.597cm^3 volume. Wound #3 Wound #3 is a Dehisced Wound located on the Left Amputation Site - Below Knee . There was a Skin/Subcutaneous Tissue Debridement ([id number]) debridement with total area of 2 sq cm performed by Emmert Roethler, Ignacia Felling., MD. with the following instrument(s): Curette to remove Viable and Non-Viable tissue/material including Exudate,  Fibrin/Slough, and Subcutaneous after achieving pain control using Lidocaine 4% Topical Solution. A time out was conducted prior to the start of the procedure. A Minimum amount of bleeding was controlled with Pressure. The procedure was tolerated well with a pain level of 0 throughout and a pain level of 0 following the procedure. Post Debridement Measurements: 0.4cm length x 5cm width x 0.4cm depth; 0.628cm^3 volume. Plan Wound Cleansing: Wound #1 Right Calcaneous: Clean wound with Normal Saline. Wound #2 Right Toe Third: Clean wound with Normal Saline. Wound #3 Left Amputation Site - Below Knee: Clean wound with Normal Saline. [name], [name] ([id number]) Anesthetic: Wound #1 Right Calcaneous: Topical Lidocaine 4% cream applied to wound bed prior to  debridement Wound #2 Right Toe Third: Topical Lidocaine 4% cream applied to wound bed prior to debridement Wound #3 Left Amputation Site - Below Knee: Topical Lidocaine 4% cream applied to wound bed prior to debridement Primary Wound Dressing: Wound #1 Right Calcaneous: Santyl Ointment Wound #2 Right Toe Third: Santyl Ointment Wound #3 Left Amputation Site - Below Knee: Santyl Ointment Plain packing gauze Secondary Dressing: Wound #1 Right Calcaneous: Boardered Foam Dressing XtraSorb - to weeping skin and over wound Wound #2 Right Toe Third: Gauze and Kerlix/Conform Wound #3 Left Amputation Site - Below Knee: Boardered Foam Dressing XtraSorb - to weeping skin and over wound Dressing Change Frequency: Wound #1 Right Calcaneous: Change dressing every day. Wound #2 Right Toe Third: Change dressing every day. Wound #3 Left Amputation Site - Below Knee: Change dressing every day. Follow-up Appointments: Wound #1 Right Calcaneous: Return Appointment in 1 week. Wound #2 Right Toe Third: Return Appointment in 1 week. 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 Home Health: Wound #1 Right  Calcaneous: Continue Home Health Visits - [facility] Nurse may visit PRN to address patient s wound care needs. FACE TO FACE ENCOUNTER: MEDICARE and MEDICAID PATIENTS: I certify that this patient is under my care and that I had a face-to-face encounter that meets the physician face-to-face encounter requirements with this patient on this date. The encounter with the patient was in whole or in part for the following MEDICAL CONDITION: (primary reason for Home Healthcare) MEDICAL NECESSITY: I certify, that based on my findings, NURSING services are a medically necessary home health service. HOME BOUND STATUS: I certify that my clinical findings support that this patient is homebound (i.e., Due to illness or injury, pt requires aid of supportive devices such as crutches, cane, wheelchairs, walkers, the use of special transportation or the assistance of another person to leave their place of residence. There is a normal inability to leave the home and doing so requires considerable and taxing effort. Other absences are [name], [name] ([id number]) for medical reasons / religious services and are infrequent or of short duration when for other reasons). If current dressing causes regression in wound condition, may D/C ordered dressing product/s and apply Normal Saline Moist Dressing daily until next Wound Healing Center / Other MD appointment. Notify Wound Healing Center of regression in wound condition at [phone]. Please direct any NON-WOUND related issues/requests for orders to patient's Primary Care Physician Wound #2 Right Toe Third: Continue Home Health Visits - [facility] Nurse may visit PRN to address patient s wound care needs. FACE TO FACE ENCOUNTER: MEDICARE and MEDICAID PATIENTS: I certify that this patient is under my care and that I had a face-to-face encounter that meets the physician face-to-face encounter requirements with this patient on this date. The  encounter with the patient was in whole or in part for the following MEDICAL CONDITION: (primary reason for Home Healthcare) MEDICAL NECESSITY: I certify, that based on my findings, NURSING services are a medically necessary home health service. HOME BOUND STATUS: I certify that my clinical findings support that this patient is homebound (i.e., Due to illness or injury, pt requires aid of supportive devices such as crutches, cane, wheelchairs, walkers, the use of special transportation or the assistance of another person to leave their place of residence. There is a normal inability to leave the home and doing so requires considerable and taxing effort. Other absences are for medical reasons / religious services and are infrequent  or of short duration when for other reasons). If current dressing causes regression in wound condition, may D/C ordered dressing product/s and apply Normal Saline Moist Dressing daily until next Wound Healing Center / Other MD appointment. Notify Wound Healing Center of regression in wound condition at [phone]. Please direct any NON-WOUND related issues/requests for orders to patient's Primary Care Physician Wound #3 Left Amputation Site - Below Knee: Continue Home Health Visits - [facility] Nurse may visit PRN to address patient s wound care needs. FACE TO FACE ENCOUNTER: MEDICARE and MEDICAID PATIENTS: I certify that this patient is under my care and that I had a face-to-face encounter that meets the physician face-to-face encounter requirements with this patient on this date. The encounter with the patient was in whole or in part for the following MEDICAL CONDITION: (primary reason for Home Healthcare) MEDICAL NECESSITY: I certify, that based on my findings, NURSING services are a medically necessary home health service. HOME BOUND STATUS: I certify that my clinical findings support that this patient is homebound (i.e., Due to illness or injury, pt  requires aid of supportive devices such as crutches, cane, wheelchairs, walkers, the use of special transportation or the assistance of another person to leave their place of residence. There is a normal inability to leave the home and doing so requires considerable and taxing effort. Other absences are for medical reasons / religious services and are infrequent or of short duration when for other reasons). If current dressing causes regression in wound condition, may D/C ordered dressing product/s and apply Normal Saline Moist Dressing daily until next Wound Healing Center / Other MD appointment. Notify Wound Healing Center of regression in wound condition at [phone]. Please direct any NON-WOUND related issues/requests for orders to patient's Primary Care Physician Since the edema has gone down the wounds are looking much better and I have recommended Santyl to the left lower extremity and also the right heel. They are keeping a close watch on her hemoglobin and I have also recommended that her K+ should be checked because of the Lasix. [name], [name] ([id number]) She will come back and see me next week. Electronic Signature(s) Signed: 04/22/2015 2:01:56 PM By: Evlyn Kanner MD, FACS Entered By: Evlyn Kanner on 04/22/2015 14:01:56 [name] ([id number]) -------------------------------------------------------------------------------- SuperBill Details Patient Name: [name] Date of Service: 04/22/2015 Medical Record Number: [id number] Patient Account Number: [id number] Date of Birth/Sex: [birth year] (81 y.o. 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 L97.812 Non-pressure chronic ulcer of other part of right lower leg with fat layer exposed Disruption of external operation (surgical) wound, not elsewhere classified, initial T81.31XA encounter Facility Procedures CPT4: Description Modifier Quantity Code [id number] 11042 - DEB SUBQ TISSUE 20 SQ CM/< 1 ICD-10 Description Diagnosis E11.621 Type 2 diabetes mellitus with foot ulcer I70.235 Atherosclerosis of native arteries of right leg with ulceration of other part of  foot 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

## 2015-04-29 ENCOUNTER — Encounter: Payer: Medicare Other | Attending: Surgery | Admitting: Surgery

## 2015-04-29 DIAGNOSIS — L97812 Non-pressure chronic ulcer of other part of right lower leg with fat layer exposed: Secondary | ICD-10-CM | POA: Diagnosis not present

## 2015-04-29 DIAGNOSIS — Z89512 Acquired absence of left leg below knee: Secondary | ICD-10-CM | POA: Diagnosis not present

## 2015-04-29 DIAGNOSIS — T8131XA Disruption of external operation (surgical) wound, not elsewhere classified, initial encounter: Secondary | ICD-10-CM | POA: Insufficient documentation

## 2015-04-29 DIAGNOSIS — L97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: Secondary | ICD-10-CM | POA: Diagnosis present

## 2015-04-29 DIAGNOSIS — X58XXXA Exposure to other specified factors, initial encounter: Secondary | ICD-10-CM | POA: Insufficient documentation

## 2015-04-29 DIAGNOSIS — I70235 Atherosclerosis of native arteries of right leg with ulceration of other part of foot: Secondary | ICD-10-CM | POA: Insufficient documentation

## 2015-04-29 DIAGNOSIS — E11621 Type 2 diabetes mellitus with foot ulcer: Secondary | ICD-10-CM | POA: Insufficient documentation

## 2015-04-29 NOTE — Progress Notes (Signed)
[name], [name] ([id number]) Visit Report for 04/29/2015 Arrival Information Details Patient Name: [name], [name] Date of Service: 04/29/2015 10:15 AM Medical Record Number: [id number] Patient Account Number: [id number] Date of Birth/Sex: 05-12-1921 (81 y.o. Female) Treating RN: Afful, RN, BSN, [place] Sink Primary Care Physician: Dorothey Baseman Other Clinician: Referring Physician: Terance Hart, DAVID Treating Physician/Extender: Rudene Re in Treatment: 11 Visit Information History Since Last Visit Any new allergies or adverse reactions: No Patient Arrived: Wheel Chair Had a fall or experienced change in No activities of daily living that may affect Arrival Time: 10:26 risk of falls: Accompanied By: dtr in law Signs or symptoms of abuse/neglect since last No Transfer Assistance: Manual visito Patient Identification Verified: Yes Hospitalized since last visit: No Secondary Verification Process Yes Has Dressing in Place as Prescribed: Yes Completed: Pain Present Now: No Patient Has Alerts: Yes Patient Alerts: DMII Electronic Signature(s) Signed: 04/29/2015 10:26:52 AM By: Elpidio Eric BSN, RN Entered By: Elpidio Eric on 04/29/2015 10:26:52 [name] ([id number]) -------------------------------------------------------------------------------- Encounter Discharge Information Details Patient Name: [name] Date of Service: 04/29/2015 10:15 AM Medical Record Number: [id number] Patient Account Number: [id number] Date of Birth/Sex: 09-23-1921 (81 y.o. Female) Treating RN: Afful, RN, BSN, [place] Sink Primary Care Physician: Dorothey Baseman Other Clinician: Referring Physician: Terance Hart DAVID Treating Physician/Extender: Rudene Re in Treatment: 11 Encounter Discharge Information Items Discharge Pain Level: 0 Discharge Condition: Stable Ambulatory Status: Wheelchair Discharge Destination: Home Transportation: Private Auto Accompanied By: dtr in law Schedule Follow-up  Appointment: No Medication Reconciliation completed No and provided to Patient/Care Margorie Renner: Provided on Clinical Summary of Care: 04/29/2015 Form Type Recipient Paper Patient HS Electronic Signature(s) Signed: 04/29/2015 11:09:16 AM By: Gwenlyn Perking Previous Signature: 04/29/2015 10:52:47 AM Version By: Elpidio Eric BSN, RN Entered By: Gwenlyn Perking on 04/29/2015 11:09:16 [name] ([id number]) -------------------------------------------------------------------------------- Lower Extremity Assessment Details Patient Name: [name] Date of Service: 04/29/2015 10:15 AM Medical Record Number: [id number] Patient Account Number: [id number] Date of Birth/Sex: Dec 24, 1920 (81 y.o. Female) Treating RN: Afful, RN, BSN, [place] Sink Primary Care Physician: Terance Hart, DAVID Other Clinician: Referring Physician: Terance Hart, DAVID Treating Physician/Extender: Rudene Re in Treatment: 11 Edema Assessment Assessed: [Left: No] Franne Forts: No] Edema: [Left: Ye] [Right: s] Calf Left: Right: Point of Measurement: 36 cm From Medial Instep cm 43 cm Ankle Left: Right: Point of Measurement: 8 cm From Medial Instep cm 25.4 cm Vascular Assessment Claudication: Claudication Assessment [Right:None] Pulses: Posterior Tibial Dorsalis Pedis Palpable: [Right:No] Doppler: [Right:Multiphasic] Extremity colors, hair growth, and conditions: Extremity Color: [Right:Normal] Hair Growth on Extremity: [Right:No] Temperature of Extremity: [Right:Warm] Capillary Refill: [Right:< 3 seconds] Electronic Signature(s) Signed: 04/29/2015 10:34:28 AM By: Elpidio Eric BSN, RN Entered By: Elpidio Eric on 04/29/2015 10:34:28 [name] ([id number]) -------------------------------------------------------------------------------- Multi Wound Chart Details Patient Name: [name] Date of Service: 04/29/2015 10:15 AM Medical Record Number: [id number] Patient Account Number: [id number] Date of Birth/Sex: [birth year]  (81 y.o. Female) Treating RN: Clover Mealy, RN, BSN, [place] Sink Primary Care Physician: Dorothey Baseman Other Clinician: Referring Physician: Terance Hart, DAVID Treating Physician/Extender: Rudene Re in Treatment: 11 Vital Signs Height(in): 62 Pulse(bpm): 64 Weight(lbs): 155 Blood Pressure 136/61 (mmHg): Body Mass Index(BMI): 28 Temperature(F): 97.5 Respiratory Rate 16 (breaths/min): Photos: [1:No Photos] [2:No Photos] [3:No Photos] Wound Location: [1:Right Calcaneous] [2:Right Toe Third] [3:Left Amputation Site - Below Knee] Wounding Event: [1:Gradually Appeared] [2:Gradually Appeared] [3:Surgical Injury] 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] Comorbid History: [1:Arrhythmia, Deep Vein Thrombosis, Peripheral Arterial Disease, Type II Arterial Disease, Type II Arterial Disease, Type II Diabetes] [2:Arrhythmia, Deep Vein Thrombosis, Peripheral  Diabetes] [3:Arrhythmia, Deep Vein Thrombosis,  Peripheral Diabetes] Date Acquired: [1:11/04/2014] [2:11/04/2014] [3:01/03/2015] Weeks of Treatment: [1:11] [2:11] [3:10] Wound Status: [1:Open] [2:Open] [3:Open] Measurements L x W x D 1.8x1.8x0.2 [2:0.8x0.9x0.1] [3:0.9x4.8x0.4] (cm) Area (cm) : [1:2.545] [2:0.565] [3:3.393] Volume (cm) : [1:0.509] [2:0.057] [3:1.357] % Reduction in Area: [1:53.70%] [2:40.00%] [3:67.30%] % Reduction in Volume: 7.50% [2:39.40%] [3:67.30%] Classification: [1:Full Thickness Without Exposed Support Structures] [2:Full Thickness Without Exposed Support Structures] [3:Full Thickness Without Exposed Support Structures] HBO Classification: [1:Grade 1] [2:Grade 1] [3:Grade 1] Exudate Amount: [1:Medium] [2:Medium] [3:Medium] Exudate Type: [1:Serosanguineous] [2:Serosanguineous] [3:Serosanguineous] Exudate Color: [1:red, brown] [2:red, brown] [3:red, brown] Wound Margin: [1:Distinct, outline attached Distinct, outline attached Distinct, outline attached] Granulation  Amount: [1:Small (1-33%)] [2:Medium (34-66%)] [3:Medium (34-66%)] Granulation Quality: [1:Pink, Pale] [2:Pink, Pale] [3:Pale] Necrotic Amount: [1:Medium (34-66%)] [2:Medium (34-66%)] [3:Medium (34-66%)] Exposed Structures: [place], [place] ([id number]) Fascia: No Fascia: No Fascia: No Fat: No Fat: No Fat: No Tendon: No Tendon: No Tendon: No Muscle: No Muscle: No Muscle: No Joint: No Joint: No Joint: No Bone: No Bone: No Bone: No Limited to Skin Limited to Skin Limited to Skin Breakdown Breakdown Breakdown Epithelialization: None Small (1-33%) Medium (34-66%) Debridement: Debridement ([id number] Debridement ([id number]- N/A 11047) 11047) Time-Out Taken: Yes Yes N/A Pain Control: Lidocaine 4% Topical Lidocaine 4% Topical N/A Solution Solution Tissue Debrided: Fibrin/Slough, Exudates, Fibrin/Slough, Exudates, N/A Subcutaneous Subcutaneous Level: Skin/Subcutaneous Skin/Subcutaneous N/A Tissue Tissue Debridement Area (sq 3.24 0.72 N/A cm): Instrument: Curette Curette N/A Bleeding: Minimum Minimum N/A Hemostasis Achieved: Pressure Pressure N/A Procedural Pain: 0 0 N/A Post Procedural Pain: 0 0 N/A Debridement Treatment Procedure was tolerated Procedure was tolerated N/A Response: well well Post Debridement 1.8x1.8x0.2 0.8x0.9x0.1 N/A Measurements L x W x D (cm) Post Debridement 0.509 0.057 N/A Volume: (cm) Periwound Skin Texture: Edema: Yes Edema: No Edema: Yes Excoriation: No Excoriation: No Excoriation: No Induration: No Induration: No Induration: No Callus: No Callus: No Callus: No Crepitus: No Crepitus: No Crepitus: No Fluctuance: No Fluctuance: No Fluctuance: No Friable: No Friable: No Friable: No Rash: No Rash: No Rash: No Scarring: No Scarring: No Scarring: No Periwound Skin Maceration: Yes Maceration: No Maceration: Yes Moisture: Moist: Yes Moist: No Moist: Yes Dry/Scaly: No Dry/Scaly: No Dry/Scaly: No Periwound Skin Color: Atrophie  Blanche: No Atrophie Blanche: No Atrophie Blanche: No Cyanosis: No Cyanosis: No Cyanosis: No Ecchymosis: No Ecchymosis: No Ecchymosis: No Erythema: No Erythema: No Erythema: No Hemosiderin Staining: No Hemosiderin Staining: No Hemosiderin Staining: No Mottled: No Mottled: No Mottled: No [name], [name] ([id number]) Pallor: No Pallor: No Pallor: No Rubor: No Rubor: No Rubor: No Temperature: No Abnormality No Abnormality No Abnormality Tenderness on Yes No Yes Palpation: Wound Preparation: Ulcer Cleansing: Ulcer Cleansing: Ulcer Cleansing: Rinsed/Irrigated with Rinsed/Irrigated with Rinsed/Irrigated with Saline Saline Saline Topical Anesthetic Topical Anesthetic Topical Anesthetic Applied: Other: lidocaine Applied: Other: lidocaine Applied: Other: lidocaine 4% 4% 4% Procedures Performed: Debridement Debridement N/A Treatment Notes Electronic Signature(s) Signed: 04/29/2015 10:50:22 AM By: Elpidio Eric BSN, RN Entered By: Elpidio Eric on 04/29/2015 10:50:22 [name] ([id number]) -------------------------------------------------------------------------------- Multi-Disciplinary Care Plan Details Patient Name: [name] Date of Service: 04/29/2015 10:15 AM Medical Record Number: [id number] Patient Account Number: [id number] Date of Birth/Sex: Jun 10, 1921 (81 y.o. Female) Treating RN: Clover Mealy, RN, BSN, [place] Sink Primary Care Physician: Dorothey Baseman Other Clinician: Referring Physician: Terance Hart DAVID Treating Physician/Extender: Rudene Re in Treatment: 11 Active Inactive Abuse / Safety / Falls / Self Care Management Nursing Diagnoses: Impaired physical mobility Potential for falls Goals: Patient will remain injury free Date Initiated: 02/11/2015 Goal Status:  Active Patient/caregiver will verbalize understanding of skin care regimen Date Initiated: 02/11/2015 Goal Status: Active Patient/caregiver will verbalize/demonstrate measures taken to prevent  injury and/or falls Date Initiated: 02/11/2015 Goal Status: Active Patient/caregiver will verbalize/demonstrate understanding of what to do in case of emergency Date Initiated: 02/11/2015 Goal Status: Active Interventions: Assess fall risk on admission and as needed Provide education on fall prevention Provide education on safe transfers Treatment Activities: Patient referred to home care : 04/29/2015 Notes: Nutrition Nursing Diagnoses: Imbalanced nutrition Potential for alteratiion in Nutrition/Potential for imbalanced nutrition [name], [name] ([id number]) Goals: Patient/caregiver verbalizes understanding of need to maintain therapeutic glucose control per primary care physician Date Initiated: 02/11/2015 Goal Status: Active Patient/caregiver will maintain therapeutic glucose control Date Initiated: 02/11/2015 Goal Status: Active Interventions: Assess HgA1c results as ordered upon admission and as needed Provide education on elevated blood sugars and impact on wound healing Provide education on nutrition Treatment Activities: Education provided on Nutrition : 04/22/2015 Obtain HgA1c : 04/29/2015 Notes: Wound/Skin Impairment Nursing Diagnoses: Impaired tissue integrity Knowledge deficit related to ulceration/compromised skin integrity Goals: Patient/caregiver will verbalize understanding of skin care regimen Date Initiated: 02/11/2015 Goal Status: Active Ulcer/skin breakdown will heal within 14 weeks Date Initiated: 02/11/2015 Goal Status: Active Interventions: Assess patient/caregiver ability to obtain necessary supplies Assess patient/caregiver ability to perform ulcer/skin care regimen upon admission and as needed Assess ulceration(s) every visit Provide education on ulcer and skin care Treatment Activities: Skin care regimen initiated : 04/29/2015 Topical wound management initiated : 04/29/2015 Notes: [name], [name] ([id number]) Electronic Signature(s) Signed: 04/29/2015  10:50:11 AM By: Elpidio Eric BSN, RN Entered By: Elpidio Eric on 04/29/2015 10:50:11 [name] ([id number]) -------------------------------------------------------------------------------- Pain Assessment Details Patient Name: [name] Date of Service: 04/29/2015 10:15 AM Medical Record Number: [id number] Patient Account Number: [id number] Date of Birth/Sex: 09/30/1920 (81 y.o. Female) Treating RN: Clover Mealy, RN, BSN, [place] Sink Primary Care Physician: Dorothey Baseman Other Clinician: Referring Physician: Dorothey Baseman Treating Physician/Extender: Rudene Re in Treatment: 11 Active Problems Location of Pain Severity and Description of Pain Patient Has Paino No Site Locations Pain Management and Medication Current Pain Management: Electronic Signature(s) Signed: 04/29/2015 10:27:00 AM By: Elpidio Eric BSN, RN Entered By: Elpidio Eric on 04/29/2015 10:27:00 [name] ([id number]) -------------------------------------------------------------------------------- Patient/Caregiver Education Details Patient Name: [name] Date of Service: 04/29/2015 10:15 AM Medical Record Number: [id number] Patient Account Number: [id number] Date of Birth/Gender: [birth year] (81 y.o. Female) Treating RN: Afful, RN, BSN, [place] Sink Primary Care Physician: Dorothey Baseman Other Clinician: Referring Physician: Terance Hart DAVID Treating Physician/Extender: Rudene Re in Treatment: 11 Education Assessment Education Provided To: Patient Education Topics Provided Elevated Blood Sugar/ Impact on Healing: Methods: Explain/Verbal Responses: State content correctly Nutrition: Methods: Explain/Verbal Responses: State content correctly Safety: Methods: Explain/Verbal Wound/Skin Impairment: Methods: Explain/Verbal Responses: State content correctly Electronic Signature(s) Signed: 04/29/2015 10:53:10 AM By: Elpidio Eric BSN, RN Entered By: Elpidio Eric on 04/29/2015 10:53:10 [name] ([id number]) -------------------------------------------------------------------------------- Wound Assessment Details Patient Name: [name] Date of Service: 04/29/2015 10:15 AM Medical Record Number: [id number] Patient Account Number: [id number] Date of Birth/Sex: 1920/10/12 (81 y.o. 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 Wound Open Wounding Event: Gradually Appeared Status: Date Acquired: 11/04/2014 Comorbid Arrhythmia, Deep Vein Thrombosis, Weeks Of Treatment: 11 History: Peripheral Arterial Disease, Type II Clustered Wound: No Diabetes Wound Measurements Length: (cm) 1.8 Width: (cm) 1.8 Depth: (  cm) 0.2 Area: (cm) 2.545 Volume: (cm) 0.509 % Reduction in Area: 53.7% % Reduction in Volume: 7.5% Epithelialization: None Tunneling: No Undermining: No Wound Description Full Thickness Without Exposed Classification: Support Structures Diabetic Severity Grade 1 (Wagner): Wound Margin: Distinct, outline attached Exudate Amount: Medium Exudate Type: Serosanguineous Exudate Color: red, brown Foul Odor After Cleansing: No Wound Bed Granulation Amount: Small (1-33%) Exposed Structure Granulation Quality: Pink, Pale Fascia Exposed: No Necrotic Amount: Medium (34-66%) Fat Layer Exposed: No Necrotic Quality: Adherent Slough Tendon Exposed: No Muscle Exposed: No Joint Exposed: No Bone Exposed: No Limited to Skin Breakdown Periwound Skin Texture Texture Color No Abnormalities Noted: No No Abnormalities Noted: No [place], [place] ([id number]) Callus: No Atrophie Blanche: No Crepitus: No Cyanosis: No Excoriation: No Ecchymosis: No Fluctuance: No Erythema: No Friable: No Hemosiderin  Staining: No Induration: No Mottled: No Localized Edema: Yes Pallor: No Rash: No Rubor: No Scarring: No Temperature / Pain Moisture Temperature: No Abnormality No Abnormalities Noted: No Tenderness on Palpation: Yes Dry / Scaly: No Maceration: Yes Moist: Yes Wound Preparation Ulcer Cleansing: Rinsed/Irrigated with Saline Topical Anesthetic Applied: Other: lidocaine 4%, Treatment Notes Wound #1 (Right Calcaneous) 1. Cleansed with: Clean wound with Normal Saline 2. Anesthetic Topical Lidocaine 4% cream to wound bed prior to debridement 4. Dressing Applied: Santyl Ointment Plain packing gauze Other dressing (specify in notes) 5. Secondary Dressing Applied Bordered Foam Dressing Electronic Signature(s) Signed: 04/29/2015 10:44:14 AM By: Elpidio Eric BSN, RN Entered By: Elpidio Eric on 04/29/2015 10:44:13 [name] ([id number]) -------------------------------------------------------------------------------- Wound Assessment Details Patient Name: [name] Date of Service: 04/29/2015 10:15 AM Medical Record Number: [id number] Patient Account Number: [id number] Date of Birth/Sex: 07-15-1921 (81 y.o. Female) Treating RN: Afful, RN, BSN, [place] Sink Primary Care Physician: Terance Hart, DAVID Other Clinician: Referring Physician: Terance Hart, DAVID Treating Physician/Extender: Rudene Re in Treatment: 11 Wound Status Wound Number: 2 Primary Arterial Insufficiency Ulcer Etiology: Wound Location: Right Toe Third Wound Open Wounding Event: Gradually Appeared Status: Date Acquired: 11/04/2014 Comorbid Arrhythmia, Deep Vein Thrombosis, Weeks Of Treatment: 11 History: Peripheral Arterial Disease, Type II Clustered Wound: No Diabetes Wound Measurements Length: (cm) 0.8 Width: (cm) 0.9 Depth: (cm) 0.1 Area: (cm) 0.565 Volume: (cm) 0.057 % Reduction in Area: 40% % Reduction in Volume: 39.4% Epithelialization: Small (1-33%) Tunneling: No Undermining: No Wound  Description Full Thickness Without Exposed Classification: Support Structures Diabetic Severity Grade 1 (Wagner): Wound Margin: Distinct, outline attached Exudate Amount: Medium Exudate Type: Serosanguineous Exudate Color: red, brown Foul Odor After Cleansing: No Wound Bed Granulation Amount: Medium (34-66%) Exposed Structure Granulation Quality: Pink, Pale Fascia Exposed: No Necrotic Amount: Medium (34-66%) Fat Layer Exposed: No Necrotic Quality: Adherent Slough Tendon Exposed: No Muscle Exposed: No Joint Exposed: No Bone Exposed: No Limited to Skin Breakdown Periwound Skin Texture Texture Color No Abnormalities Noted: No No Abnormalities Noted: No [name], [name] ([id number]) Callus: No Atrophie Blanche: No Crepitus: No Cyanosis: No Excoriation: No Ecchymosis: No Fluctuance: No Erythema: No Friable: No Hemosiderin Staining: No Induration: No Mottled: No Localized Edema: No Pallor: No Rash: No Rubor: No Scarring: No Temperature / Pain Moisture Temperature: No Abnormality No Abnormalities Noted: No Dry / Scaly: No Maceration: No Moist: No Wound Preparation Ulcer Cleansing: Rinsed/Irrigated with Saline Topical Anesthetic Applied: Other: lidocaine 4%, Treatment Notes Wound #2 (Right Toe Third) 1. Cleansed with: Clean wound with Normal Saline 2. Anesthetic Topical Lidocaine 4% cream to wound bed prior to debridement 4. Dressing Applied: Santyl Ointment Plain packing gauze Other dressing (specify in notes) 5. Secondary Dressing Applied Bordered Foam Dressing  Electronic Signature(s) Signed: 04/29/2015 10:46:03 AM By: Elpidio Eric BSN, RN Entered By: Elpidio Eric on 04/29/2015 10:46:03 [name] ([id number]) -------------------------------------------------------------------------------- Wound Assessment Details Patient Name: [name] Date of Service: 04/29/2015 10:15 AM Medical Record Number: [id number] Patient Account Number: [id number] Date  of Birth/Sex: September 02, 1921 (81 y.o. Female) Treating RN: Afful, RN, BSN, [place] Sink Primary Care Physician: Terance Hart, DAVID Other Clinician: Referring Physician: Terance Hart, DAVID Treating Physician/Extender: Rudene Re in Treatment: 11 Wound Status Wound Number: 3 Primary Dehisced Wound Etiology: Wound Location: Left Amputation Site - Below Knee Wound Open Status: Wounding Event: Surgical Injury Comorbid Arrhythmia, Deep Vein Thrombosis, Date Acquired: 01/03/2015 History: Peripheral Arterial Disease, Type II Weeks Of Treatment: 10 Diabetes Clustered Wound: No Wound Measurements Length: (cm) 0.9 Width: (cm) 4.8 Depth: (cm) 0.4 Area: (cm) 3.393 Volume: (cm) 1.357 % Reduction in Area: 67.3% % Reduction in Volume: 67.3% Epithelialization: Medium (34-66%) Tunneling: No Undermining: No Wound Description Full Thickness Without Exposed Foul Odor Classification: Support Structures Diabetic Severity Grade 1 (Wagner): Wound Margin: Distinct, outline attached Exudate Amount: Medium Exudate Type: Serosanguineous Exudate Color: red, brown After Cleansing: No Wound Bed Granulation Amount: Medium (34-66%) Exposed Structure Granulation Quality: Pale Fascia Exposed: No Necrotic Amount: Medium (34-66%) Fat Layer Exposed: No Tendon Exposed: No Muscle Exposed: No Joint Exposed: No Bone Exposed: No Limited to Skin Breakdown Periwound Skin Texture Texture Color No Abnormalities Noted: No No Abnormalities Noted: No [name], [name] ([id number]) Callus: No Atrophie Blanche: No Crepitus: No Cyanosis: No Excoriation: No Ecchymosis: No Fluctuance: No Erythema: No Friable: No Hemosiderin Staining: No Induration: No Mottled: No Localized Edema: Yes Pallor: No Rash: No Rubor: No Scarring: No Temperature / Pain Moisture Temperature: No Abnormality No Abnormalities Noted: No Tenderness on Palpation: Yes Dry / Scaly: No Maceration: Yes Moist: Yes Wound  Preparation Ulcer Cleansing: Rinsed/Irrigated with Saline Topical Anesthetic Applied: Other: lidocaine 4%, Treatment Notes Wound #3 (Left Amputation Site - Below Knee) 1. Cleansed with: Clean wound with Normal Saline 3. Peri-wound Care: Skin Prep 4. Dressing Applied: Iodoform packing Gauze 5. Secondary Dressing Applied Bordered Foam Dressing Electronic Signature(s) Signed: 04/29/2015 10:47:20 AM By: Elpidio Eric BSN, RN Entered By: Elpidio Eric on 04/29/2015 10:47:20 [name] ([id number]) -------------------------------------------------------------------------------- Vitals Details Patient Name: [name] Date of Service: 04/29/2015 10:15 AM Medical Record Number: [id number] Patient Account Number: [id number] Date of Birth/Sex: [birth year] (81 y.o. Female) Treating RN: Afful, RN, BSN, [place] Sink Primary Care Physician: Terance Hart, DAVID Other Clinician: Referring Physician: Terance Hart, DAVID Treating Physician/Extender: Rudene Re in Treatment: 11 Vital Signs Time Taken: 10:29 Temperature (F): 97.5 Height (in): 62 Pulse (bpm): 64 Weight (lbs): 155 Respiratory Rate (breaths/min): 16 Body Mass Index (BMI): 28.3 Blood Pressure (mmHg): 136/61 Reference Range: 80 - 120 mg / dl Electronic Signature(s) Signed: 04/29/2015 10:29:52 AM By: Elpidio Eric BSN, RN Entered By: Elpidio Eric on 04/29/2015 10:29:52

## 2015-04-29 NOTE — Progress Notes (Signed)
AMELY, VOORHEIS ([id number]) Visit Report for 04/29/2015 Chief Complaint Document Details Patient Name: [name], [name] Date of Service: 04/29/2015 10:15 AM Medical Record Number: [id number] Patient Account Number: [id number] Date of Birth/Sex: 11/08/1920 (81 y.o. Female) Treating RN: Primary Care Physician: Terance Hart, DAVID Other Clinician: Referring Physician: Terance Hart, DAVID Treating Physician/Extender: Rudene Re in Treatment: 11 Information Obtained from: Patient Chief Complaint Patient presents to the wound care center for a consult due non healing wound. 81 year old patient with comes with a history of a ulcerated area to the right third toe and the right heel which she's had for about 2 months. Electronic Signature(s) Signed: 04/29/2015 10:54:31 AM By: Evlyn Kanner MD, FACS Entered By: Evlyn Kanner on 04/29/2015 10:54:31 [name] ([id number]) -------------------------------------------------------------------------------- Debridement Details Patient Name: [name] Date of Service: 04/29/2015 10:15 AM Medical Record Number: [id number] Patient Account Number: [id number] Date of Birth/Sex: 1921-08-23 (81 y.o. Female) Treating RN: Primary Care Physician: Terance Hart, DAVID Other Clinician: Referring Physician: Terance Hart, DAVID Treating Physician/Extender: Rudene Re in Treatment: 11 Debridement Performed for Wound #1 Right Calcaneous Assessment: Performed By: Physician Tristan Schroeder., MD Debridement: Debridement Pre-procedure Yes Verification/Time Out Taken: Start Time: 10:44 Pain Control: Lidocaine 4% Topical Solution Level: Skin/Subcutaneous Tissue Total Area Debrided (L x 1.8 (cm) x 1.8 (cm) = 3.24 (cm) W): Tissue and other Non-Viable, Exudate, Fibrin/Slough, Subcutaneous material debrided: Instrument: Curette Bleeding: Minimum Hemostasis Achieved: Pressure End Time: 10:49 Procedural Pain: 0 Post Procedural Pain: 0 Response to  Treatment: Procedure was tolerated well Post Debridement Measurements of Total Wound Length: (cm) 1.8 Width: (cm) 1.8 Depth: (cm) 0.2 Volume: (cm) 0.509 Electronic Signature(s) Signed: 04/29/2015 10:54:16 AM By: Evlyn Kanner MD, FACS Previous Signature: 04/29/2015 10:48:34 AM Version By: Elpidio Eric BSN, RN Entered By: Evlyn Kanner on 04/29/2015 10:54:16 [name] ([id number]) -------------------------------------------------------------------------------- Debridement Details Patient Name: [name] Date of Service: 04/29/2015 10:15 AM Medical Record Number: [id number] Patient Account Number: [id number] Date of Birth/Sex: [birth year] (81 y.o. Female) Treating RN: Primary Care Physician: Terance Hart, DAVID Other Clinician: Referring Physician: Terance Hart, DAVID Treating Physician/Extender: Rudene Re in Treatment: 11 Debridement Performed for Wound #2 Right Toe Third Assessment: Performed By: Physician Tristan Schroeder., MD Debridement: Debridement Pre-procedure Yes Verification/Time Out Taken: Start Time: 10:49 Pain Control: Lidocaine 4% Topical Solution Level: Skin/Subcutaneous Tissue Total Area Debrided (L x 0.8 (cm) x 0.9 (cm) = 0.72 (cm) W): Tissue and other Non-Viable, Exudate, Fibrin/Slough, Subcutaneous material debrided: Instrument: Curette Bleeding: Minimum Hemostasis Achieved: Pressure End Time: 11:00 Procedural Pain: 0 Post Procedural Pain: 0 Response to Treatment: Procedure was tolerated well Post Debridement Measurements of Total Wound Length: (cm) 0.8 Width: (cm) 0.9 Depth: (cm) 0.1 Volume: (cm) 0.057 Electronic Signature(s) Signed: 04/29/2015 10:54:24 AM By: Evlyn Kanner MD, FACS Previous Signature: 04/29/2015 10:49:54 AM Version By: Elpidio Eric BSN, RN Entered By: Evlyn Kanner on 04/29/2015 10:54:24 [name] ([id number]) -------------------------------------------------------------------------------- HPI Details Patient Name:  [name] Date of Service: 04/29/2015 10:15 AM Medical Record Number: [id number] Patient Account Number: [id number] Date of Birth/Sex: 1921/06/29 (81 y.o. Female) Treating RN: Primary Care Physician: Terance Hart, DAVID Other Clinician: Referring Physician: Terance Hart, DAVID Treating Physician/Extender: Rudene Re in Treatment: 11 History of Present Illness Location: right medial heel and right third toe Quality: Patient reports experiencing a dull pain to affected area(s). Severity: Patient states wound are getting worse. Duration: Patient has had the wound for > 3 months prior to seeking treatment at the wound center Timing: Pain in wound is Intermittent (comes and goes Context: The wound appeared gradually over time  Modifying Factors: Consults to this date include:vascular surgeon and several recent surgeries. Associated Signs and Symptoms: Patient reports having foul odor and drainage from the left below-knee amputation site. HPI Description: A pleasant 81 year old patient who is known to have diabetes mellitus for several years has recently gone through a series of operations with the vascular surgeon Dr. Gilda Crease. In January and February she had several surgeries on the left lower extremity with an attempt to have limb salvage for gangrenous changes of her forefoot. 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 On 01/07/2015 she also had a right lower extremity distal runoff with a angioplasty of the right anterior tibial artery to maximize her blood flow to the foot. She recently had her staples removed at Dr. Marijean Heath office on 01/31/2015 and at that time a right lower extremity duplex was done which showed patent vessels and a patent stent with distal occluded posterior tibial artery. Though the duplex was noncritical the recommendations from the PA at the vascular surgery office was that of a  angiogram to be done but the patient said she would rather try some bone care before. The patient has received doxycycline and Cipro in the recent past and she takes oral medications for her diabetes. Other than that I reviewed her list of all her medications. No recent hemoglobin A1c has been done and no recent x-rays of the right foot have been done. 02/18/2015 -- x-ray of the right foot was done on 02/11/2015 and it shows no evidence of acute osteomyelitis of the third toe or of the calcaneus. She had gone to the vascular surgery office and they had noted that there is dehiscence of the left part of the amputation site of the below-knee wound and they have asked [place] to kindly take over the care of this. There've been doing dressings for the right heel and right third toe. 02/25/2015 -- we have received notes from the vascular group who saw her last on 02/13/2015 and she was seen by the PA Ms. Cleda Daub. She had recommended that the patient continue to follow with [place] for wound care including management of the left BKA stump, which has had dehiscence of the lateral part. They will consider repeating a arterial duplex or angiogram if the right lower extremity does not heal within a reasonable period of time. 03/18/2015 -- he saw the vascular surgeon Dr. Gilda Crease and he has set her up for an angioplasty sometime in the middle of July. she is doing well otherwise. HERMINA, BARNARD ([id number]) 04/10/2015 -- the patient had a procedure done on 04/08/2015 and this was a right angioplasty of the dorsalis pedis, anterior tibial artery, first superficial femoral artery in its midportion. There was successful intervention with recanalization and in-line flow to the right foot. 04/22/2015 -- the patient was looking rather pale today and the daughter confirms that her hemoglobin was down to 6.9. she is being monitored by her PCP. Her Lasix dose was increased and her edema has gone down  significantly. 04/29/2015 -- she had vascular studies done and the ABI on the right is 1.3 and the toe pressures were within normal limits. Her hemoglobin is still around 7 and she refuses to take any blood transfusions for religious reasons. Her potassium was normal. Electronic Signature(s) Signed: 04/29/2015 10:57:08 AM By: Evlyn Kanner MD, FACS Previous Signature: 04/29/2015 10:54:36 AM Version By: Evlyn Kanner MD, FACS Entered By: Evlyn Kanner on 04/29/2015 10:57:07  NOMIE, BUCHBERGER ([id number]) -------------------------------------------------------------------------------- Physical Exam Details Patient Name: HALEN, [name] Date of Service: 04/29/2015 10:15 AM Medical Record Number: [id number] Patient Account Number: [id number] Date of Birth/Sex: [birth year] (81 y.o. Female) Treating RN: Primary Care Physician: Terance Hart, DAVID Other Clinician: Referring Physician: Terance Hart, DAVID Treating Physician/Extender: Rudene Re in Treatment: 11 Constitutional . Pulse regular. Respirations normal and unlabored. Afebrile. . Eyes Nonicteric. Reactive to light. Ears, Nose, Mouth, and Throat Lips, teeth, and gums WNL.Marland Kitchen Moist mucosa without lesions . Neck supple and nontender. No palpable supraclavicular or cervical adenopathy. Normal sized without goiter. Respiratory WNL. No retractions.. Cardiovascular Pedal Pulses WNL. No clubbing, cyanosis or edema. Chest Breasts symmetical and no nipple discharge.. Breast tissue WNL, no masses, lumps, or tenderness.. Lymphatic No adneopathy. No adenopathy. No adenopathy. Musculoskeletal Adexa without tenderness or enlargement.. Digits and nails w/o clubbing, cyanosis, infection, petechiae, ischemia, or inflammatory conditions.. Integumentary (Hair, Skin) No suspicious lesions. No crepitus or fluctuance. No peri-wound warmth or erythema. No masses.Marland Kitchen Psychiatric Judgement and insight Intact.. No evidence of depression, anxiety, or  agitation.. Notes the wounds on the right toe and the right medial calcaneum are much cleaner and after some sharp debridement have healthy granulation tissue. The ones on her left below-knee stump are healing nicely except for some excoriation possibly from central oozing out of the wounds. Electronic Signature(s) Signed: 04/29/2015 10:57:18 AM By: Evlyn Kanner MD, FACS Previous Signature: 04/29/2015 10:55:55 AM Version By: Evlyn Kanner MD, FACS Entered By: Evlyn Kanner on 04/29/2015 10:57:17 [name] ([id number]) -------------------------------------------------------------------------------- Physician Orders Details Patient Name: [name] Date of Service: 04/29/2015 10:15 AM Medical Record Number: [id number] Patient Account Number: [id number] Date of Birth/Sex: 1920/12/23 (81 y.o. Female) Treating RN: Afful, RN, BSN, Makena Sink Primary Care Physician: Dorothey Baseman Other Clinician: Referring Physician: Terance Hart DAVID Treating Physician/Extender: Rudene Re in Treatment: 6 Verbal / Phone Orders: Yes Clinician: Afful, RN, BSN, Rita Read Back and Verified: Yes Diagnosis Coding Wound Cleansing Wound #1 Right Calcaneous o Cleanse wound with mild soap and water o May Shower, gently pat wound dry prior to applying new dressing. o May shower with protection. Wound #2 Right Toe Third o Cleanse wound with mild soap and water o May Shower, gently pat wound dry prior to applying new dressing. o May shower with protection. Wound #3 Left Amputation Site - Below Knee o Cleanse wound with mild soap and water o May Shower, gently pat wound dry prior to applying new dressing. o May shower with protection. Primary Wound Dressing Wound #1 Right Calcaneous o Santyl Ointment Wound #2 Right Toe Third o Santyl Ointment Wound #3 Left Amputation Site - Below Knee o Iodoform packing Gauze Secondary Dressing Wound #1 Right Calcaneous o Gauze and  Kerlix/Conform Wound #2 Right Toe Third o Gauze and Kerlix/Conform Wound #3 Left Amputation Site - Below Knee o Gauze and Kerlix/Conform Dressing Change Frequency Brownlee, Zaley ([id number]) Wound #1 Right Calcaneous o Change dressing every day. Wound #2 Right Toe Third o Change dressing every day. Wound #3 Left Amputation Site - Below Knee o Change dressing every day. Follow-up Appointments Wound #1 Right Calcaneous o Return Appointment in 1 week. Wound #2 Right Toe Third o Return Appointment in 1 week. Wound #3 Left Amputation Site - Below Knee o Return Appointment in 1 week. Electronic Signature(s) Signed: 04/29/2015 10:52:02 AM By: Elpidio Eric BSN, RN Signed: 04/29/2015 12:09:58 PM By: Evlyn Kanner MD, FACS Entered By: Elpidio Eric on 04/29/2015 10:52:02 [name] ([id number]) -------------------------------------------------------------------------------- Problem List Details Patient Name: [name] Date of  Service: 04/29/2015 10:15 AM Medical Record Number: [id number] Patient Account Number: [id number] Date of Birth/Sex: 1921/01/03 (81 y.o. 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 with 02/11/2015 Yes fat layer exposed L97.812 Non-pressure chronic ulcer of other part of right lower leg 02/11/2015 Yes with fat layer exposed T81.31XA Disruption of external operation (surgical) wound, not 02/18/2015 Yes elsewhere classified, initial  encounter Inactive Problems Resolved Problems Electronic Signature(s) Signed: 04/29/2015 10:54:06 AM By: Evlyn Kanner MD, FACS Entered By: Evlyn Kanner on 04/29/2015 10:54:05 [name] ([id number]) -------------------------------------------------------------------------------- Progress Note Details Patient Name: [name] Date of Service: 04/29/2015 10:15 AM Medical Record Number: [id number] Patient Account Number: [id number] Date of Birth/Sex: 1921/03/01 (81 y.o. Female) Treating RN: Primary Care Physician: Terance Hart, DAVID Other Clinician: Referring Physician: Terance Hart, DAVID Treating Physician/Extender: Rudene Re in Treatment: 11 Subjective Chief Complaint Information obtained from Patient Patient presents to the wound care center for a consult due non healing wound. 81 year old patient with comes with a history of a ulcerated area to the right third toe and the right heel which she's had for about 2 months. History of Present Illness (HPI) The following HPI elements were documented for the patient's wound: Location: right medial heel and right third toe Quality: Patient reports experiencing a dull pain to affected area(s). Severity: Patient states wound are getting worse. Duration: Patient has had the wound for > 3 months prior to seeking treatment at the wound center Timing: Pain in wound is Intermittent (comes and goes Context: The wound appeared gradually over time Modifying Factors: Consults to this date include:vascular surgeon and several recent surgeries. Associated Signs and Symptoms: Patient reports having foul odor and drainage from the left below-knee amputation site. A pleasant 81 year old patient who is known to have diabetes mellitus for several years has recently gone through a series of operations with the vascular surgeon Dr. Gilda Crease. In January and February she had several surgeries on the left lower extremity with an attempt to have limb  salvage for gangrenous changes of her forefoot. 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 On 01/07/2015 she also had a right lower extremity distal runoff with a angioplasty of the right anterior tibial artery to maximize her blood flow to the foot. She recently had her staples removed at Dr. Marijean Heath office on 01/31/2015 and at that time a right lower extremity duplex was done which showed patent vessels and a patent stent with distal occluded posterior tibial artery. Though the duplex was noncritical the recommendations from the PA at the vascular surgery office was that of a angiogram to be done but the patient said she would rather try some bone care before. The patient has received doxycycline and Cipro in the recent past and she takes oral medications for her diabetes. Other than that I reviewed her list of all her medications. No recent hemoglobin A1c has been done and no recent x-rays of the right foot have been done. 02/18/2015 -- x-ray of the right foot was done on 02/11/2015 and it shows no evidence of acute  osteomyelitis of the third toe or of the calcaneus. She had gone to the vascular surgery office and they had noted that there is dehiscence of the left part of the amputation site of the below-knee wound and they have asked [place] to kindly take over the care of this. There've been doing dressings for the right heel and right third toe. KIRSTEN, SPEARING ([id number]) 02/25/2015 -- we have received notes from the vascular group who saw her last on 02/13/2015 and she was seen by the PA Ms. Cleda Daub. She had recommended that the patient continue to follow with [place] for wound care including management of the left BKA stump, which has had dehiscence of the lateral part. They will consider repeating a arterial duplex or angiogram if the right lower extremity does not heal within a reasonable period of  time. 03/18/2015 -- he saw the vascular surgeon Dr. Gilda Crease and he has set her up for an angioplasty sometime in the middle of July. she is doing well otherwise. 04/10/2015 -- the patient had a procedure done on 04/08/2015 and this was a right angioplasty of the dorsalis pedis, anterior tibial artery, first superficial femoral artery in its midportion. There was successful intervention with recanalization and in-line flow to the right foot. 04/22/2015 -- the patient was looking rather pale today and the daughter confirms that her hemoglobin was down to 6.9. she is being monitored by her PCP. Her Lasix dose was increased and her edema has gone down significantly. 04/29/2015 -- she had vascular studies done and the ABI on the right is 1.3 and the toe pressures were within normal limits. Her hemoglobin is still around 7 and she refuses to take any blood transfusions for religious reasons. Her potassium was normal. Objective Constitutional Pulse regular. Respirations normal and unlabored. Afebrile. Vitals Time Taken: 10:29 AM, Height: 62 in, Weight: 155 lbs, BMI: 28.3, Temperature: 97.5 F, Pulse: 64 bpm, Respiratory Rate: 16 breaths/min, Blood Pressure: 136/61 mmHg. Eyes Nonicteric. Reactive to light. Ears, Nose, Mouth, and Throat Lips, teeth, and gums WNL.Marland Kitchen Moist mucosa without lesions . Neck supple and nontender. No palpable supraclavicular or cervical adenopathy. Normal sized without goiter. Respiratory WNL. No retractions.. Cardiovascular Pedal Pulses WNL. No clubbing, cyanosis or edema. ALEGRIA, Adja ([id number]) Chest Breasts symmetical and no nipple discharge.. Breast tissue WNL, no masses, lumps, or tenderness.. Lymphatic No adneopathy. No adenopathy. No adenopathy. Musculoskeletal Adexa without tenderness or enlargement.. Digits and nails w/o clubbing, cyanosis, infection, petechiae, ischemia, or inflammatory conditions.Marland Kitchen Psychiatric Judgement and insight Intact.. No  evidence of depression, anxiety, or agitation.. General Notes: the wounds on the right toe and the right medial calcaneum are much cleaner and after some sharp debridement have healthy granulation tissue. The ones on her left below-knee stump are healing nicely except for some excoriation possibly from central oozing out of the wounds. Integumentary (Hair, Skin) No suspicious lesions. No crepitus or fluctuance. No peri-wound warmth or erythema. No masses.. Wound #1 status is Open. Original cause of wound was Gradually Appeared. The wound is located on the Right Calcaneous. The wound measures 1.8cm length x 1.8cm width x 0.2cm depth; 2.545cm^2 area and 0.509cm^3 volume. The wound is limited to skin breakdown. There is no tunneling or undermining noted. There is a medium amount of serosanguineous drainage noted. The wound margin is distinct with the outline attached to the wound base. There is small (1-33%) pink, pale granulation within the wound bed. There is a medium (34-66%) amount of necrotic tissue within the wound bed including Adherent  Slough. The periwound skin appearance exhibited: Localized Edema, Maceration, Moist. The periwound skin appearance did not exhibit: Callus, Crepitus, Excoriation, Fluctuance, Friable, Induration, Rash, Scarring, Dry/Scaly, Atrophie Blanche, Cyanosis, Ecchymosis, Hemosiderin Staining, Mottled, Pallor, Rubor, Erythema. Periwound temperature was noted as No Abnormality. The periwound has tenderness on palpation. Wound #2 status is Open. Original cause of wound was Gradually Appeared. The wound is located on the Right Toe Third. The wound measures 0.8cm length x 0.9cm width x 0.1cm depth; 0.565cm^2 area and 0.057cm^3 volume. The wound is limited to skin breakdown. There is no tunneling or undermining noted. There is a medium amount of serosanguineous drainage noted. The wound margin is distinct with the outline attached to the wound base. There is medium (34-66%)  pink, pale granulation within the wound bed. There is a medium (34-66%) amount of necrotic tissue within the wound bed including Adherent Slough. The periwound skin appearance did not exhibit: Callus, Crepitus, Excoriation, Fluctuance, Friable, Induration, Localized Edema, Rash, Scarring, Dry/Scaly, Maceration, Moist, Atrophie Blanche, Cyanosis, Ecchymosis, Hemosiderin Staining, Mottled, Pallor, Rubor, Erythema. Periwound temperature was noted as No Abnormality. Wound #3 status is Open. Original cause of wound was Surgical Injury. The wound is located on the Left Amputation Site - Below Knee. The wound measures 0.9cm length x 4.8cm width x 0.4cm depth; 3.393cm^2 area and 1.357cm^3 volume. The wound is limited to skin breakdown. There is no tunneling or undermining noted. There is a medium amount of serosanguineous drainage noted. The wound margin is distinct with the outline attached to the wound base. There is medium (34-66%) pale granulation within the wound bed. There is a medium (34-66%) amount of necrotic tissue within the wound bed. The periwound skin appearance exhibited: Localized Edema, Maceration, Moist. The periwound skin appearance did not [place], Shalla ([id number]) exhibit: Callus, Crepitus, Excoriation, Fluctuance, Friable, Induration, Rash, Scarring, Dry/Scaly, Atrophie Blanche, Cyanosis, Ecchymosis, Hemosiderin Staining, Mottled, Pallor, Rubor, Erythema. Periwound temperature was noted as No Abnormality. The periwound has tenderness on palpation. 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 L97.812 - Non-pressure chronic ulcer of other part of right lower leg with fat layer exposed T81.31XA - Disruption of  external operation (surgical) wound, not elsewhere classified, initial encounter On her left BKA stump we will use some HIDA form gauze packing and stop the Santyl. She will continue to use Santyl on her right foot. Since she refuses to take blood transfusions I have asked her daughter-in-law to talk about maybe Iron injections or Procrit with her PCP. 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 There was a Skin/Subcutaneous Tissue Debridement (40981-19147) debridement with total area of 3.24 sq cm performed by Tristan Schroeder., MD. with the following instrument(s): Curette to remove Non-Viable tissue/material including Exudate, Fibrin/Slough, and Subcutaneous after achieving pain control using Lidocaine 4% Topical Solution. A time out was conducted prior to the start of the procedure. A Minimum amount of bleeding was controlled with Pressure. The procedure was tolerated well with a pain level of 0 throughout and a pain level of 0 following the procedure. Post Debridement Measurements: 1.8cm length x 1.8cm width x 0.2cm depth; 0.509cm^3 volume. 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 There was a Skin/Subcutaneous Tissue Debridement ([id number]) debridement with total area of 0.72 sq cm performed by Tristan Schroeder., MD. with the following instrument(s): Curette to remove Non-Viable tissue/material including Exudate, Fibrin/Slough, and Subcutaneous after achieving pain control using Weiand, Bernadette ([id number]) Lidocaine 4% Topical Solution. A time out was conducted prior to the start of the procedure. A Minimum amount of bleeding was controlled with Pressure. The procedure was tolerated well with a pain level of 0 throughout and a pain level of 0 following the procedure. Post Debridement Measurements: 0.8cm length x 0.9cm width x 0.1cm depth; 0.057cm^3  volume. Plan Wound Cleansing: Wound #1 Right Calcaneous: Cleanse wound with mild soap and water May Shower, gently pat wound dry prior to applying new dressing. May shower with protection. Wound #2 Right Toe Third: Cleanse wound with mild soap and water May Shower, gently pat wound dry prior to applying new dressing. May shower with protection. Wound #3 Left Amputation Site - Below Knee: Cleanse wound with mild soap and water May Shower, gently pat wound dry prior to applying new dressing. May shower with protection. Primary Wound Dressing: Wound #1 Right Calcaneous: Santyl Ointment Wound #2 Right Toe Third: Santyl Ointment Wound #3 Left Amputation Site - Below Knee: Iodoform packing Gauze Secondary Dressing: Wound #1 Right Calcaneous: Gauze and Kerlix/Conform Wound #2 Right Toe Third: Gauze and Kerlix/Conform Wound #3 Left Amputation Site - Below Knee: Gauze and Kerlix/Conform Dressing Change Frequency: Wound #1 Right Calcaneous: Change dressing every day. Wound #2 Right Toe Third: Change dressing every day. Wound #3 Left Amputation Site - Below Knee: Change dressing every day. Follow-up Appointments: Wound #1 Right Calcaneous: Return Appointment in 1 week. Wound #2 Right Toe Third: Return Appointment in 1 week. TSENG, Mariadelcarmen ([id number]) Wound #3 Left Amputation Site - Below Knee: Return Appointment in 1 week. On her left BKA stump we will use some HIDA form gauze packing and stop the Santyl. She will continue to use Santyl on her right foot. Since she refuses to take blood transfusions I have asked her daughter-in-law to talk about maybe Iron injections or Procrit with her PCP. She will come back and see me next week Electronic Signature(s) Signed: 04/29/2015 10:58:53 AM By: Evlyn Kanner MD, FACS Entered By: Evlyn Kanner on 04/29/2015 10:58:53 [name]  ([id number]) -------------------------------------------------------------------------------- SuperBill Details Patient Name: [name] Date of Service: 04/29/2015 Medical Record Number: [id number] Patient Account Number: [id number] Date of Birth/Sex: [birth year] (81 y.o. 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 L97.812 Non-pressure chronic ulcer of other part of right lower leg with fat layer exposed Disruption of external operation (surgical) wound, not elsewhere classified, initial T81.31XA encounter Facility Procedures CPT4: Description Modifier Quantity Code [id number] 11042 - DEB SUBQ TISSUE 20 SQ CM/< 1 ICD-10 Description Diagnosis E11.621 Type 2 diabetes mellitus with foot ulcer L97.812 Non-pressure chronic ulcer of other part of right lower leg with fat layer  exposed Physician Procedures CPT4: Description Modifier Quantity Code [id number] 11042 - WC PHYS SUBQ TISS 20 SQ CM 1 ICD-10 Description Diagnosis E11.621 Type 2 diabetes mellitus with foot ulcer  W09.811 Non-pressure chronic ulcer of other part of right lower leg with fat layer exposed Electronic Signature(s) Signed: 04/29/2015 10:59:10 AM By: Evlyn Kanner MD, FACS Entered By: Evlyn Kanner on 04/29/2015 10:59:09

## 2015-05-06 ENCOUNTER — Encounter: Payer: Medicare Other | Admitting: Surgery

## 2015-05-06 DIAGNOSIS — L97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: Secondary | ICD-10-CM | POA: Diagnosis not present

## 2015-05-07 NOTE — Progress Notes (Signed)
[name], [name] ([id number]) Visit Report for 05/06/2015 Arrival Information Details Patient Name: [name] Date of Service: 05/06/2015 11:00 AM Medical Record Number: [id number] Patient Account Number: [id number] Date of Birth/Sex: 06/22/1921 (81 y.o. Female) Treating RN: Curtis Sites Primary Care Physician: Dorothey Baseman Other Clinician: Referring Physician: Dorothey Baseman Treating Physician/Extender: Rudene Re in Treatment: 12 Visit Information History Since Last Visit Added or deleted any medications: No Patient Arrived: Wheel Chair Any new allergies or adverse reactions: No Arrival Time: 11:13 Had a fall or experienced change in No activities of daily living that may affect Accompanied By: dtr risk of falls: Transfer Assistance: Manual Signs or symptoms of abuse/neglect since last No Patient Identification Verified: Yes visito Secondary Verification Process Yes Hospitalized since last visit: No Completed: Pain Present Now: No Patient Has Alerts: Yes Patient Alerts: DMII Electronic Signature(s) Signed: 05/06/2015 5:02:12 PM By: Curtis Sites Entered By: Curtis Sites on 05/06/2015 11:13:19 [name] ([id number]) -------------------------------------------------------------------------------- Encounter Discharge Information Details Patient Name: [name] Date of Service: 05/06/2015 11:00 AM Medical Record Number: [id number] Patient Account Number: [id number] Date of Birth/Sex: 03-17-1921 (81 y.o. Female) Treating RN: Curtis Sites Primary Care Physician: Dorothey Baseman Other Clinician: Referring Physician: Dorothey Baseman Treating Physician/Extender: Rudene Re in Treatment: 12 Encounter Discharge Information Items Discharge Pain Level: 0 Discharge Condition: Stable Ambulatory Status: Wheelchair Discharge Destination: Home Private Transportation: Auto Accompanied By: dtr Schedule Follow-up Appointment: Yes Medication  Reconciliation completed and No provided to Patient/Care [name]: Clinical Summary of Care: Electronic Signature(s) Signed: 05/06/2015 5:02:12 PM By: Curtis Sites Entered By: Curtis Sites on 05/06/2015 12:01:29 [name] ([id number]) -------------------------------------------------------------------------------- Lower Extremity Assessment Details Patient Name: [name] Date of Service: 05/06/2015 11:00 AM Medical Record Number: [id number] Patient Account Number: [id number] Date of Birth/Sex: January 01, 1921 (81 y.o. Female) Treating RN: Curtis Sites Primary Care Physician: Dorothey Baseman Other Clinician: Referring Physician: Dorothey Baseman Treating Physician/Extender: Rudene Re in Treatment: 12 Vascular Assessment Pulses: Posterior Tibial Palpable: [Right:No] Doppler: [Right:Multiphasic] Extremity colors, hair growth, and conditions: Extremity Color: [Right:Normal] Hair Growth on Extremity: [Right:No] Temperature of Extremity: [Right:Warm] Capillary Refill: [Right:< 3 seconds] Electronic Signature(s) Signed: 05/06/2015 5:02:12 PM By: Curtis Sites Entered By: Curtis Sites on 05/06/2015 11:19:11 [name] ([id number]) -------------------------------------------------------------------------------- Multi Wound Chart Details Patient Name: [name] Date of Service: 05/06/2015 11:00 AM Medical Record Number: [id number] Patient Account Number: [id number] Date of Birth/Sex: [birth year] (81 y.o. 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] Comorbid History: [1:Arrhythmia, Deep Vein Thrombosis, Peripheral Arterial Disease, Type II Arterial Disease, Type II Arterial Disease, Type II Diabetes] [2:Arrhythmia, Deep Vein Thrombosis, Peripheral Diabetes] [3:Arrhythmia, Deep Vein Thrombosis,  Peripheral Diabetes] Date Acquired: [1:11/04/2014] [2:11/04/2014] [3:01/03/2015] Weeks of Treatment: [1:12] [2:12] [3:11] Wound Status: [1:Open] [2:Open] [3:Open] Measurements L x W x D 1.6x1.8x0.2 [2:0.5x0.6x0.1] [3:0.9x4.5x1] (cm) Area (cm) : [1:2.262] [2:0.236] [3:3.181] Volume (cm) : [1:0.452] [2:0.024] [3:3.181] % Reduction in Area: [1:58.90%] [2:74.90%] [3:69.30%] % Reduction in Volume: 17.80% [2:74.50%] [3:23.30%] Classification: [1:Full Thickness Without Exposed Support Structures] [2:Full Thickness Without Exposed Support Structures] [3:Full Thickness Without Exposed Support Structures] HBO Classification: [1:Grade 1] [  2:Grade 1] [3:Grade 1] Exudate Amount: [1:Medium] [2:Medium] [3:Medium] Exudate Type: [1:Serosanguineous] [2:Serosanguineous] [3:Serosanguineous] Exudate Color: [1:red, brown] [2:red, brown] [3:red, brown] Wound Margin: [1:Distinct, outline attached Distinct, outline attached Distinct, outline attached] Granulation Amount: [1:Medium (34-66%)] [2:Large (67-100%)] [3:Large (67-100%)] Granulation Quality: [1:Pink, Pale] [2:Pink, Pale] [3:Pale] Necrotic Amount: [1:Medium (34-66%)] [2:Small (1-33%)] [3:Small (1-33%)] Exposed Structures: Langwell, Shayanne ([id number]) Fascia: No Fascia: No Fascia: No Fat: No Fat: No Fat: No Tendon: No Tendon: No Tendon: No Muscle: No Muscle: No Muscle: No Joint: No Joint: No Joint: No Bone: No Bone: No Bone: No Limited to Skin Limited to  Skin Limited to Skin Breakdown Breakdown Breakdown Epithelialization: None Small (1-33%) Medium (34-66%) Debridement: Debridement ([id number]- N/A Debridement ([id number] 11047) 11047) Time-Out Taken: Yes N/A Yes Pain Control: Lidocaine 4% Topical N/A Lidocaine 4% Topical Solution Solution Tissue Debrided: Fibrin/Slough, N/A Fibrin/Slough, Subcutaneous Subcutaneous Level: Skin/Subcutaneous N/A Skin/Subcutaneous Tissue Tissue Debridement Area (sq 2.88 N/A 4.05 cm): Instrument: Curette N/A Curette Bleeding: Minimum N/A Minimum Hemostasis Achieved: Pressure N/A Pressure Procedural Pain: 0 N/A 0 Post Procedural Pain: 0 N/A 0 Debridement Treatment Procedure was tolerated N/A Procedure was tolerated Response: well well Post Debridement 1.6x1.8x0.2 N/A 0.9x4.5x1 Measurements L x W x D (cm) Post Debridement 0.452 N/A 3.181 Volume: (cm) Periwound Skin Texture: Edema: Yes Edema: No Edema: Yes Excoriation: No Excoriation: No Excoriation: No Induration: No Induration: No Induration: No Callus: No Callus: No Callus: No Crepitus: No Crepitus: No Crepitus: No Fluctuance: No Fluctuance: No Fluctuance: No Friable: No Friable: No Friable: No Rash: No Rash: No Rash: No Scarring: No Scarring: No Scarring: No Periwound Skin Maceration: Yes Maceration: No Maceration: Yes Moisture: Moist: Yes Moist: No Moist: Yes Dry/Scaly: No Dry/Scaly: No Dry/Scaly: No Periwound Skin Color: Atrophie Blanche: No Atrophie Blanche: No Atrophie Blanche: No Cyanosis: No Cyanosis: No Cyanosis: No Ecchymosis: No Ecchymosis: No Ecchymosis: No Erythema: No Erythema: No Erythema: No Hemosiderin Staining: No Hemosiderin Staining: No Hemosiderin Staining: No Mottled: No Mottled: No Mottled: No Caridi, Skye ([id number]) Pallor: No Pallor: No Pallor: No Rubor: No Rubor: No Rubor: No Temperature: No Abnormality No Abnormality No Abnormality Tenderness on Yes No Yes Palpation: Wound  Preparation: Ulcer Cleansing: Ulcer Cleansing: Ulcer Cleansing: Rinsed/Irrigated with Rinsed/Irrigated with Rinsed/Irrigated with Saline Saline Saline Topical Anesthetic Topical Anesthetic Topical Anesthetic Applied: Other: lidocaine Applied: Other: lidocaine Applied: Other: lidocaine 4% 4% 4% Procedures Performed: Debridement N/A Debridement Treatment Notes Electronic Signature(s) Signed: 05/06/2015 5:02:12 PM By: Curtis Sites Entered By: Curtis Sites on 05/06/2015 11:39:47 [name] ([id number]) -------------------------------------------------------------------------------- Multi-Disciplinary Care Plan Details Patient Name: [name] Date of Service: 05/06/2015 11:00 AM Medical Record Number: [id number] Patient Account Number: [id number] Date of Birth/Sex: 07-07-1921 (81 y.o. Female) Treating RN: Curtis Sites Primary Care Physician: Dorothey Baseman Other Clinician: Referring Physician: Dorothey Baseman Treating Physician/Extender: Rudene Re in Treatment: 12 Active Inactive Abuse / Safety / Falls / Self Care Management Nursing Diagnoses: Impaired physical mobility Potential for falls Goals: Patient will remain injury free Date Initiated: 02/11/2015 Goal Status: Active Patient/caregiver will verbalize understanding of skin care regimen Date Initiated: 02/11/2015 Goal Status: Active Patient/caregiver will verbalize/demonstrate measures taken to prevent injury and/or falls Date Initiated: 02/11/2015 Goal Status: Active Patient/caregiver will verbalize/demonstrate understanding of what to do in case of emergency Date Initiated: 02/11/2015 Goal Status: Active Interventions: Assess fall risk on admission and as needed Provide education on fall prevention Provide education on safe transfers Treatment Activities: Patient referred to home care : 05/06/2015 Notes: Nutrition Nursing Diagnoses: Imbalanced nutrition Potential for alteratiion in  Nutrition/Potential  for imbalanced nutrition Curto, Almyra ([id number]) Goals: Patient/caregiver verbalizes understanding of need to maintain therapeutic glucose control per primary care physician Date Initiated: 02/11/2015 Goal Status: Active Patient/caregiver will maintain therapeutic glucose control Date Initiated: 02/11/2015 Goal Status: Active Interventions: Assess HgA1c results as ordered upon admission and as needed Provide education on elevated blood sugars and impact on wound healing Provide education on nutrition Treatment Activities: Education provided on Nutrition : 04/22/2015 Obtain HgA1c : 05/06/2015 Notes: Wound/Skin Impairment Nursing Diagnoses: Impaired tissue integrity Knowledge deficit related to ulceration/compromised skin integrity Goals: Patient/caregiver will verbalize understanding of skin care regimen Date Initiated: 02/11/2015 Goal Status: Active Ulcer/skin breakdown will heal within 14 weeks Date Initiated: 02/11/2015 Goal Status: Active Interventions: Assess patient/caregiver ability to obtain necessary supplies Assess patient/caregiver ability to perform ulcer/skin care regimen upon admission and as needed Assess ulceration(s) every visit Provide education on ulcer and skin care Treatment Activities: Skin care regimen initiated : 05/06/2015 Topical wound management initiated : 05/06/2015 Notes: GERENE, NEDD ([id number]) Electronic Signature(s) Signed: 05/06/2015 5:02:12 PM By: Curtis Sites Entered By: Curtis Sites on 05/06/2015 11:39:32 [name] ([id number]) -------------------------------------------------------------------------------- Patient/Caregiver Education Details Patient Name: [name] Date of Service: 05/06/2015 11:00 AM Medical Record Number: [id number] Patient Account Number: [id number] Date of Birth/Gender: 06/14/1921 (81 y.o. Female) Treating RN: Curtis Sites Primary Care Physician: Dorothey Baseman Other  Clinician: Referring Physician: Dorothey Baseman Treating Physician/Extender: Rudene Re in Treatment: 12 Education Assessment Education Provided To: Patient Education Topics Provided Wound/Skin Impairment: Handouts: Other: new wound care as ordered Methods: Demonstration, Explain/Verbal Responses: State content correctly Electronic Signature(s) Signed: 05/06/2015 5:02:12 PM By: Curtis Sites Entered By: Curtis Sites on 05/06/2015 12:03:13 [name] ([id number]) -------------------------------------------------------------------------------- Wound Assessment Details Patient Name: [name] Date of Service: 05/06/2015 11:00 AM Medical Record Number: [id number] Patient Account Number: [id number] Date of Birth/Sex: 16-Jan-1921 (81 y.o. 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 Calcaneous Wound Open Wounding Event: Gradually Appeared Status: Date Acquired: 11/04/2014 Comorbid Arrhythmia, Deep Vein Thrombosis, Weeks Of Treatment: 12 History: Peripheral Arterial Disease, Type II Clustered Wound: No Diabetes Photos Photo Uploaded By: Curtis Sites on 05/06/2015 16:33:46 Wound Measurements Length: (cm) 1.6 Width: (cm) 1.8 Depth: (cm) 0.2 Area: (cm) 2.262 Volume: (cm) 0.452 % Reduction in Area: 58.9% % Reduction in Volume: 17.8% Epithelialization: None Tunneling: No Undermining: No Wound Description Full Thickness Without Exposed Foul Odor Af Classification: Support Structures Diabetic Severity Grade 1 (Wagner): Wound Margin: Distinct, outline attached Exudate Amount: Medium Exudate Type: Serosanguineous Exudate Color: red, brown ter Cleansing: No Wound Bed Granulation Amount: Medium  (34-66%) Exposed Structure Blondin, Lajada ([id number]) Granulation Quality: Pink, Pale Fascia Exposed: No Necrotic Amount: Medium (34-66%) Fat Layer Exposed: No Necrotic Quality: Adherent Slough Tendon Exposed: No Muscle Exposed: No Joint Exposed: No Bone Exposed: No Limited to Skin Breakdown Periwound Skin Texture Texture Color No Abnormalities Noted: No No Abnormalities Noted: No Callus: No Atrophie Blanche: No Crepitus: No Cyanosis: No Excoriation: No Ecchymosis: No Fluctuance: No Erythema: No Friable: No Hemosiderin Staining: No Induration: No Mottled: No Localized Edema: Yes Pallor: No Rash: No Rubor: No Scarring: No Temperature / Pain Moisture Temperature: No Abnormality No Abnormalities Noted: No Tenderness on Palpation: Yes Dry / Scaly: No Maceration: Yes Moist: Yes Wound Preparation Ulcer Cleansing: Rinsed/Irrigated with Saline Topical Anesthetic Applied: Other: lidocaine 4%, Treatment Notes Wound #1 (Right Calcaneous) 1. Cleansed with:  Clean wound with Normal Saline 2. Anesthetic Topical Lidocaine 4% cream to wound bed prior to debridement 4. Dressing Applied: Aquacel Ag Other dressing (specify in notes) 5. Secondary Dressing Applied Gauze and Kerlix/Conform 7. Secured with Tape Notes JOUA, BAKE ([id number]) Electronic Signature(s) Signed: 05/06/2015 5:02:12 PM By: Curtis Sites Entered By: Curtis Sites on 05/06/2015 11:35:47 [name] ([id number]) -------------------------------------------------------------------------------- Wound Assessment Details Patient Name: [name] Date of Service: 05/06/2015 11:00 AM Medical Record Number: [id number] Patient Account Number: [id number] Date of Birth/Sex: Nov 17, 1920 (81 y.o. Female) Treating RN: Curtis Sites Primary Care Physician: Dorothey Baseman Other Clinician: Referring Physician: Terance Hart, DAVID Treating Physician/Extender: Rudene Re in Treatment:  12 Wound Status Wound Number: 2 Primary Arterial Insufficiency Ulcer Etiology: Wound Location: Right Toe Third Wound Open Wounding Event: Gradually Appeared Status: Date Acquired: 11/04/2014 Comorbid Arrhythmia, Deep Vein Thrombosis, Weeks Of Treatment: 12 History: Peripheral Arterial Disease, Type II Clustered Wound: No Diabetes Photos Photo Uploaded By: Curtis Sites on 05/06/2015 16:34:28 Wound Measurements Length: (cm) 0.5 Width: (cm) 0.6 Depth: (cm) 0.1 Area: (cm) 0.236 Volume: (cm) 0.024 % Reduction in Area: 74.9% % Reduction in Volume: 74.5% Epithelialization: Small (1-33%) Tunneling: No Undermining: No Wound Description Full Thickness Without Exposed Classification: Support Structures Diabetic Severity Grade 1 (Wagner): Wound Margin: Distinct, outline attached Exudate Amount: Medium Exudate Type: Serosanguineous Exudate Color: red, brown Foul Odor After Cleansing: No Wound Bed Granulation Amount: Large (67-100%) Exposed Structure Dzikowski, Antonieta ([id number]) Granulation Quality: Pink, Pale Fascia Exposed: No Necrotic Amount: Small (1-33%) Fat Layer Exposed: No Necrotic Quality: Adherent Slough Tendon Exposed: No Muscle Exposed: No Joint Exposed: No Bone Exposed: No Limited to Skin Breakdown Periwound Skin Texture Texture Color No Abnormalities Noted: No No Abnormalities Noted: No Callus: No Atrophie Blanche: No Crepitus: No Cyanosis: No Excoriation: No Ecchymosis: No Fluctuance: No Erythema: No Friable: No Hemosiderin Staining: No Induration: No Mottled: No Localized Edema: No Pallor: No Rash: No Rubor: No Scarring: No Temperature / Pain Moisture Temperature: No Abnormality No Abnormalities Noted: No Dry / Scaly: No Maceration: No Moist: No Wound Preparation Ulcer Cleansing: Rinsed/Irrigated with Saline Topical Anesthetic Applied: Other: lidocaine 4%, Treatment Notes Wound #2 (Right Toe Third) 1. Cleansed with: Clean wound  with Normal Saline 2. Anesthetic Topical Lidocaine 4% cream to wound bed prior to debridement 4. Dressing Applied: Aquacel Ag Other dressing (specify in notes) 5. Secondary Dressing Applied Gauze and Kerlix/Conform 7. Secured with Tape Notes DANAI, GOTTO ([id number]) Electronic Signature(s) Signed: 05/06/2015 5:02:12 PM By: Curtis Sites Entered By: Curtis Sites on 05/06/2015 11:36:46 [name] ([id number]) -------------------------------------------------------------------------------- Wound Assessment Details Patient Name: [name] Date of Service: 05/06/2015 11:00 AM Medical Record Number: [id number] Patient Account Number: [id number] Date of Birth/Sex: 1920/10/08 (81 y.o. Female) Treating RN: Curtis Sites Primary Care Physician: Dorothey Baseman Other Clinician: Referring Physician: Terance Hart, DAVID Treating Physician/Extender: Rudene Re in Treatment: 12 Wound Status Wound Number: 3 Primary Dehisced Wound Etiology: Wound Location: Left Amputation Site - Below Knee Wound Open Status: Wounding Event: Surgical Injury Comorbid Arrhythmia, Deep Vein Thrombosis, Date Acquired: 01/03/2015 History: Peripheral Arterial Disease, Type II Weeks Of Treatment: 11 Diabetes Clustered Wound: No Photos Photo Uploaded By: Curtis Sites on 05/06/2015 16:34:29 Wound Measurements Length: (cm) 0.9 Width: (cm) 4.5 Depth: (cm) 1 Area: (cm) 3.181 Volume: (cm) 3.181 % Reduction in Area: 69.3% % Reduction in Volume: 23.3% Epithelialization: Medium (34-66%) Tunneling: No Undermining: No Wound Description Full Thickness Without Exposed Foul Odor A Classification: Support Structures Diabetic Severity Grade 1 (Wagner): Wound Margin: Distinct, outline attached  Exudate Amount: Medium Exudate Type: Serosanguineous Exudate Color: red, brown fter Cleansing: No Wound Bed Granulation Amount: Large (67-100%) Exposed Structure Doble, Vertie  ([id number]) Granulation Quality: Pale Fascia Exposed: No Necrotic Amount: Small (1-33%) Fat Layer Exposed: No Tendon Exposed: No Muscle Exposed: No Joint Exposed: No Bone Exposed: No Limited to Skin Breakdown Periwound Skin Texture Texture Color No Abnormalities Noted: No No Abnormalities Noted: No Callus: No Atrophie Blanche: No Crepitus: No Cyanosis: No Excoriation: No Ecchymosis: No Fluctuance: No Erythema: No Friable: No Hemosiderin Staining: No Induration: No Mottled: No Localized Edema: Yes Pallor: No Rash: No Rubor: No Scarring: No Temperature / Pain Moisture Temperature: No Abnormality No Abnormalities Noted: No Tenderness on Palpation: Yes Dry / Scaly: No Maceration: Yes Moist: Yes Wound Preparation Ulcer Cleansing: Rinsed/Irrigated with Saline Topical Anesthetic Applied: Other: lidocaine 4%, Treatment Notes Wound #3 (Left Amputation Site - Below Knee) 1. Cleansed with: Clean wound with Normal Saline 2. Anesthetic Topical Lidocaine 4% cream to wound bed prior to debridement 4. Dressing Applied: Iodoform packing Gauze Other dressing (specify in notes) 7. Secured with Tape Notes [employer] operational [employer]) Signed: 05/06/2015 5:02:12 PM By: Mechele Dawley, Sherl ([id number]) Entered By: Curtis Sites on 05/06/2015 11:37:05 [name] ([id number]) -------------------------------------------------------------------------------- Vitals Details Patient Name: [name] Date of Service: 05/06/2015 11:00 AM Medical Record Number: [id number] Patient Account Number: [id number] Date of Birth/Sex: 06/08/1921 (81 y.o. Female) Treating RN: Curtis Sites Primary Care Physician: Dorothey Baseman Other Clinician: Referring Physician: Terance Hart, DAVID Treating Physician/Extender: Rudene Re in Treatment: 12 Vital Signs Time Taken: 11:15 Temperature (F): 97.8 Height (in): 62 Pulse (bpm): 45 Weight (lbs): 155 Respiratory  Rate (breaths/min): 18 Body Mass Index (BMI): 28.3 Blood Pressure (mmHg): 111/37 Reference Range: 80 - 120 mg / dl Electronic Signature(s) Signed: 05/06/2015 5:02:12 PM By: Curtis Sites Entered By: Curtis Sites on 05/06/2015 11:17:34

## 2015-05-08 NOTE — Progress Notes (Signed)
[name], [name] ([id number]) Visit Report for 05/06/2015 Chief Complaint Document Details Patient Name: [name] Date of Service: 05/06/2015 11:00 AM Medical Record Number: [id number] Patient Account Number: [id number] Date of Birth/Sex: 16-Oct-1920 (81 y.o. Female) Treating RN: Primary Care Physician: Terance Hart, DAVID Other Clinician: Referring Physician: Terance Hart, DAVID Treating Physician/Extender: Rudene Re in Treatment: 12 Information Obtained from: Patient Chief Complaint Patient presents to the wound care [name] for a consult due non healing wound. 81 year old patient with comes with a history of a ulcerated area to the right third toe and the right heel which she's had for about 2 months. Electronic Signature(s) Signed: 05/06/2015 11:49:59 AM By: Evlyn Kanner MD, FACS Entered By: Evlyn Kanner on 05/06/2015 11:49:59 [name] ([id number]) -------------------------------------------------------------------------------- Debridement Details Patient Name: [name] Date of Service: 05/06/2015 11:00 AM Medical Record Number: [id number] Patient Account Number: [id number] Date of Birth/Sex: [birth year] (81 y.o. Female) Treating RN: Primary Care Physician: Terance Hart, DAVID Other Clinician: Referring Physician: Terance Hart, DAVID Treating Physician/Extender: Rudene Re in Treatment: 12 Debridement Performed for Wound #1 Right Calcaneous Assessment: Performed By: Physician Tristan Schroeder., MD Debridement: Debridement Pre-procedure Yes Verification/Time Out Taken: Start Time: 11:37 Pain Control: Lidocaine 4% Topical Solution Level: Skin/Subcutaneous Tissue Total Area Debrided (L x 1.6 (cm) x 1.8 (cm) = 2.88 (cm) W): Tissue and other Viable, Non-Viable, Fibrin/Slough, Subcutaneous material debrided: Instrument: Curette Bleeding: Minimum Hemostasis Achieved: Pressure End Time: 11:38 Procedural Pain: 0 Post Procedural Pain: 0 Response to  Treatment: Procedure was tolerated well Post Debridement Measurements of Total Wound Length: (cm) 1.6 Width: (cm) 1.8 Depth: (cm) 0.2 Volume: (cm) 0.452 Electronic Signature(s) Signed: 05/06/2015 11:49:43 AM By: Evlyn Kanner MD, FACS Entered By: Evlyn Kanner on 05/06/2015 11:49:43 [name] ([id number]) -------------------------------------------------------------------------------- Debridement Details Patient Name: [name] Date of Service: 05/06/2015 11:00 AM Medical Record Number: [id number] Patient Account Number: [id number] Date of Birth/Sex: 11-07-1920 (81 y.o. Female) Treating RN: Primary Care Physician: Terance Hart, DAVID Other Clinician: Referring Physician: Terance Hart, DAVID Treating Physician/Extender: Rudene Re in Treatment: 12 Debridement Performed for Wound #3 Left Amputation Site - Below Knee Assessment: Performed By: Physician Tristan Schroeder., MD Debridement: Debridement Pre-procedure Yes Verification/Time Out Taken: Start Time: 11:35 Pain Control: Lidocaine 4% Topical Solution Level: Skin/Subcutaneous Tissue Total Area Debrided (L x 0.9 (cm) x 4.5 (cm) = 4.05 (cm) W): Tissue and other Viable, Non-Viable, Fibrin/Slough, Subcutaneous material debrided: Instrument: Curette Bleeding: Minimum Hemostasis Achieved: Pressure End Time: 11:37 Procedural Pain: 0 Post Procedural Pain: 0 Response to Treatment: Procedure was tolerated well Post Debridement Measurements of Total Wound Length: (cm) 0.9 Width: (cm) 4.5 Depth: (cm) 1 Volume: (cm) 3.181 Electronic Signature(s) Signed: 05/06/2015 11:49:51 AM By: Evlyn Kanner MD, FACS Entered By: Evlyn Kanner on 05/06/2015 11:49:51 [name] ([id number]) -------------------------------------------------------------------------------- HPI Details Patient Name: [name] Date of Service: 05/06/2015 11:00 AM Medical Record Number: [id number] Patient Account Number: [id number] Date of  Birth/Sex: October 13, 1920 (81 y.o. Female) Treating RN: Primary Care Physician: Terance Hart, DAVID Other Clinician: Referring Physician: Terance Hart, DAVID Treating Physician/Extender: Rudene Re in Treatment: 12 History of Present Illness Location: right medial heel and right third toe Quality: Patient reports experiencing a dull pain to affected area(s). Severity: Patient states wound are getting worse. Duration: Patient has had the wound for > 3 months prior to seeking treatment at the wound [name] Timing: Pain in wound is Intermittent (comes and goes Context: The wound appeared gradually over time Modifying Factors: Consults to this date include:vascular surgeon and several recent surgeries. Associated Signs and Symptoms: Patient reports having  foul odor and drainage from the left below-knee amputation site. HPI Description: A pleasant 81 year old patient who is known to have diabetes mellitus for several years has recently gone through a series of operations with the vascular surgeon Dr. Gilda Crease. In January and February she had several surgeries on the left lower extremity with an attempt to have limb salvage for gangrenous changes of her forefoot. 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 On 01/07/2015 she also had a right lower extremity distal runoff with a angioplasty of the right anterior tibial artery to maximize her blood flow to the foot. She recently had her staples removed at Dr. Marijean Heath office on 01/31/2015 and at that time a right lower extremity duplex was done which showed patent vessels and a patent stent with distal occluded posterior tibial artery. Though the duplex was noncritical the recommendations from the PA at the vascular surgery office was that of a angiogram to be done but the patient said she would rather try some bone care before. The patient has received doxycycline and Cipro  in the recent past and she takes oral medications for her diabetes. Other than that I reviewed her list of all her medications. No recent hemoglobin A1c has been done and no recent x-rays of the right foot have been done. 02/18/2015 -- x-ray of the right foot was done on 02/11/2015 and it shows no evidence of acute osteomyelitis of the third toe or of the calcaneus. She had gone to the vascular surgery office and they had noted that there is dehiscence of the left part of the amputation site of the below-knee wound and they have asked [place] to kindly take over the care of this. There've been doing dressings for the right heel and right third toe. 02/25/2015 -- we have received notes from the vascular group who saw her last on 02/13/2015 and she was seen by the PA Ms. Cleda Daub. She had recommended that the patient continue to follow with [place] for wound care including management of the left BKA stump, which has had dehiscence of the lateral part. They will consider repeating a arterial duplex or angiogram if the right lower extremity does not heal within a reasonable period of time. 03/18/2015 -- he saw the vascular surgeon Dr. Gilda Crease and he has set her up for an angioplasty sometime in the middle of July. she is doing well otherwise. [name], [name] ([id number]) 04/10/2015 -- the patient had a procedure done on 04/08/2015 and this was a right angioplasty of the dorsalis pedis, anterior tibial artery, first superficial femoral artery in its midportion. There was successful intervention with recanalization and in-line flow to the right foot. 04/22/2015 -- the patient was looking rather pale today and the daughter confirms that her hemoglobin was down to 6.9. she is being monitored by her PCP. Her Lasix dose was increased and her edema has gone down significantly. 04/29/2015 -- she had vascular studies done and the ABI on the right is 1.3 and the toe pressures were within normal limits. Her  hemoglobin is still around 7 and she refuses to take any blood transfusions for religious reasons. Her potassium was normal. Electronic Signature(s) Signed: 05/06/2015 11:50:07 AM By: Evlyn Kanner MD, FACS Entered By: Evlyn Kanner on 05/06/2015 11:50:06 [name] ([id number]) -------------------------------------------------------------------------------- Physical Exam Details Patient Name: [name] Date of Service: 05/06/2015 11:00 AM Medical Record Number: [id number] Patient Account Number: [id number] Date of Birth/Sex: 1921/05/22 (81  y.o. Female) Treating RN: Primary Care Physician: Terance Hart, DAVID Other Clinician: Referring Physician: Terance Hart, DAVID Treating Physician/Extender: Rudene Re in Treatment: 12 Constitutional . Pulse regular. Respirations normal and unlabored. Afebrile. . Eyes Nonicteric. Reactive to light. Ears, Nose, Mouth, and Throat Lips, teeth, and gums WNL.Marland Kitchen Moist mucosa without lesions . Neck supple and nontender. No palpable supraclavicular or cervical adenopathy. Normal sized without goiter. Respiratory WNL. No retractions.. Breath sounds WNL, No rubs, rales, rhonchi, or wheeze.. Cardiovascular Heart rhythm and rate regular, no murmur or gallop.. Pedal Pulses WNL. the right lower extremity and her left BKA stump continued to have a lot of edema and this may be due to her low oncotic pressure.. Lymphatic No adneopathy. No adenopathy. No adenopathy. Musculoskeletal Adexa without tenderness or enlargement.. Digits and nails w/o clubbing, cyanosis, infection, petechiae, ischemia, or inflammatory conditions.. Integumentary (Hair, Skin) No suspicious lesions. No crepitus or fluctuance. No peri-wound warmth or erythema. No masses.Marland Kitchen Psychiatric Judgement and insight Intact.. No evidence of depression, anxiety, or agitation.. Notes The wounds all look much better and there is minimal slough. I believe some of the Santyll is oozing out of the  wound bed and causing excoriation of the skin surrounding and we will stop using Santyl. Electronic Signature(s) Signed: 05/06/2015 11:51:31 AM By: Evlyn Kanner MD, FACS Entered By: Evlyn Kanner on 05/06/2015 11:51:30 [name] ([id number]) -------------------------------------------------------------------------------- Physician Orders Details Patient Name: [name] Date of Service: 05/06/2015 11:00 AM Medical Record Number: [id number] Patient Account Number: [id number] Date of Birth/Sex: 03/06/1921 (81 y.o. Female) Treating RN: Curtis Sites Primary Care Physician: Dorothey Baseman Other Clinician: Referring Physician: Dorothey Baseman Treating Physician/Extender: Rudene Re in Treatment: 12 Verbal / Phone Orders: Yes Clinician: Curtis Sites Read Back and Verified: Yes Diagnosis Coding Wound Cleansing Wound #1 Right Calcaneous o Cleanse wound with mild soap and water o May Shower, gently pat wound dry prior to applying new dressing. o May shower with protection. Wound #2 Right Toe Third o Cleanse wound with mild soap and water o May Shower, gently pat wound dry prior to applying new dressing. o May shower with protection. Wound #3 Left Amputation Site - Below Knee o Cleanse wound with mild soap and water o May Shower, gently pat wound dry prior to applying new dressing. o May shower with protection. Primary Wound Dressing Wound #1 Right Calcaneous o Aquacel Ag Wound #2 Right Toe Third o Aquacel Ag Wound #3 Left Amputation Site - Below Knee o Iodoform packing Gauze Secondary Dressing Wound #1 Right Calcaneous o Gauze and Kerlix/Conform o XtraSorb Wound #2 Right Toe Third o Gauze and Kerlix/Conform Wound #3 Left Amputation Site - Below Knee o Gauze and Kerlix/Conform o Astrid Drafts, Ayame ([id number]) Dressing Change Frequency Wound #1 Right Calcaneous o Change dressing every day. Wound #2 Right Toe  Third o Change dressing every day. Wound #3 Left Amputation Site - Below Knee o Change dressing every day. Follow-up Appointments Wound #1 Right Calcaneous o Return Appointment in 1 week. Wound #2 Right Toe Third o Return Appointment in 1 week. Wound #3 Left Amputation Site - Below Knee o Return Appointment in 1 week. Home Health Wound #1 Right Calcaneous o Continue Home Health Visits - [facility] o Home Health Nurse may visit PRN to address patientos wound care needs. o FACE TO FACE ENCOUNTER: MEDICARE and MEDICAID PATIENTS: I certify that this patient is under my care and that I had a face-to-face encounter that meets the physician face-to-face encounter requirements with this patient on this date. The  encounter with the patient was in whole or in part for the following MEDICAL CONDITION: (primary reason for Home Healthcare) MEDICAL NECESSITY: I certify, that based on my findings, NURSING services are a medically necessary home health service. HOME BOUND STATUS: I certify that my clinical findings support that this patient is homebound (i.e., Due to illness or injury, pt requires aid of supportive devices such as crutches, cane, wheelchairs, walkers, the use of special transportation or the assistance of another person to leave their place of residence. There is a normal inability to leave the home and doing so requires considerable and taxing effort. Other absences are for medical reasons / religious services and are infrequent or of short duration when for other reasons). o If current dressing causes regression in wound condition, may D/C ordered dressing product/s and apply Normal Saline Moist Dressing daily until next Wound Healing [name] / Other MD appointment. Notify Wound Healing [name] of regression in wound condition at [phone]. o Please direct any NON-WOUND related issues/requests for orders to patient's Primary Care Physician Wound #2 Right Toe  Third o Continue Home Health Visits - [facility] o Home Health Nurse may visit PRN to address patientos wound care needs. o FACE TO FACE ENCOUNTER: MEDICARE and MEDICAID PATIENTS: I certify that this patient is under my care and that I had a face-to-face encounter that meets the physician face-to-face [name], [name] ([id number]) encounter requirements with this patient on this date. The encounter with the patient was in whole or in part for the following MEDICAL CONDITION: (primary reason for Home Healthcare) MEDICAL NECESSITY: I certify, that based on my findings, NURSING services are a medically necessary home health service. HOME BOUND STATUS: I certify that my clinical findings support that this patient is homebound (i.e., Due to illness or injury, pt requires aid of supportive devices such as crutches, cane, wheelchairs, walkers, the use of special transportation or the assistance of another person to leave their place of residence. There is a normal inability to leave the home and doing so requires considerable and taxing effort. Other absences are for medical reasons / religious services and are infrequent or of short duration when for other reasons). o If current dressing causes regression in wound condition, may D/C ordered dressing product/s and apply Normal Saline Moist Dressing daily until next Wound Healing [name] / Other MD appointment. Notify Wound Healing [name] of regression in wound condition at [phone]. o Please direct any NON-WOUND related issues/requests for orders to patient's Primary Care Physician Wound #3 Left Amputation Site - Below Knee o Continue Home Health Visits - [facility] o Home Health Nurse may visit PRN to address patientos wound care needs. o FACE TO FACE ENCOUNTER: MEDICARE and MEDICAID PATIENTS: I certify that this patient is under my care and that I had a face-to-face encounter that meets the physician face-to-face encounter  requirements with this patient on this date. The encounter with the patient was in whole or in part for the following MEDICAL CONDITION: (primary reason for Home Healthcare) MEDICAL NECESSITY: I certify, that based on my findings, NURSING services are a medically necessary home health service. HOME BOUND STATUS: I certify that my clinical findings support that this patient is homebound (i.e., Due to illness or injury, pt requires aid of supportive devices such as crutches, cane, wheelchairs, walkers, the use of special transportation or the assistance of another person to leave their place of residence. There is a normal inability to leave the home and doing so requires considerable and taxing  effort. Other absences are for medical reasons / religious services and are infrequent or of short duration when for other reasons). o If current dressing causes regression in wound condition, may D/C ordered dressing product/s and apply Normal Saline Moist Dressing daily until next Wound Healing [name] / Other MD appointment. Notify Wound Healing [name] of regression in wound condition at [phone]. o Please direct any NON-WOUND related issues/requests for orders to patient's Primary Care Physician Electronic Signature(s) Signed: 05/06/2015 4:45:11 PM By: Curtis Sites Signed: 05/08/2015 8:00:39 AM By: Evlyn Kanner MD, FACS Previous Signature: 05/06/2015 12:16:19 PM Version By: Evlyn Kanner MD, FACS Entered By: Curtis Sites on 05/06/2015 16:45:11 [name] ([id number]) -------------------------------------------------------------------------------- Problem List Details Patient Name: [name] Date of Service: 05/06/2015 11:00 AM Medical Record Number: [id number] Patient Account Number: [id number] Date of Birth/Sex: 1921-01-17 (81 y.o. 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/11/2015 Yes fat layer exposed L97.812 Non-pressure chronic ulcer of other part of right lower leg 02/11/2015 Yes with fat layer exposed T81.31XA Disruption of external operation (surgical) wound, not 02/18/2015 Yes elsewhere classified, initial encounter Inactive Problems Resolved Problems Electronic Signature(s) Signed: 05/06/2015 11:49:34 AM By: Evlyn Kanner MD, FACS Entered By: Evlyn Kanner on 05/06/2015 11:49:34 [name] ([id number]) -------------------------------------------------------------------------------- Progress Note Details Patient Name: [name] Date of Service: 05/06/2015 11:00 AM Medical Record Number: [id number] Patient Account Number: [id number] Date of Birth/Sex: 10-13-1920 (81 y.o. Female) Treating RN: Primary Care Physician: Terance Hart, DAVID Other Clinician: Referring Physician: Terance Hart, DAVID Treating Physician/Extender: Rudene Re in Treatment: 12 Subjective Chief Complaint Information obtained from Patient Patient presents to the wound care [name] for a consult due non healing wound. 81 year old patient with comes with a history of a ulcerated area to the right third toe and the right heel which she's had for about 2 months. History of Present Illness (HPI) The following HPI elements were documented for the patient's wound: Location: right medial heel and right third toe Quality: Patient reports experiencing a dull pain to affected area(s). Severity: Patient states wound are getting worse. Duration: Patient has had the wound  for > 3 months prior to seeking treatment at the wound [name] Timing: Pain in wound is Intermittent (comes and goes Context: The wound appeared gradually over time Modifying Factors: Consults to this date include:vascular surgeon and several recent surgeries. Associated Signs and Symptoms: Patient reports having foul odor and drainage from the left below-knee amputation site. A pleasant 81 year old patient who is known to have diabetes mellitus for several years has recently gone through a series of operations with the vascular surgeon Dr. Gilda Crease. In January and February she had several surgeries on the left lower extremity with an attempt to have limb salvage for gangrenous changes of her forefoot. 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 On 01/07/2015 she also had a right lower extremity distal runoff with a angioplasty of the right anterior tibial artery to maximize her blood flow to the foot. She recently had her staples removed at Dr. Marijean Heath office on 01/31/2015 and  at that time a right lower extremity duplex was done which showed patent vessels and a patent stent with distal occluded posterior tibial artery. Though the duplex was noncritical the recommendations from the PA at the vascular surgery office was that of a angiogram to be done but the patient said she would rather try some bone care before. The patient has received doxycycline and Cipro in the recent past and she takes oral medications for her diabetes. Other than that I reviewed her list of all her medications. No recent hemoglobin A1c has been done and no recent x-rays of the right foot have been done. 02/18/2015 -- x-ray of the right foot was done on 02/11/2015 and it shows no evidence of acute osteomyelitis of the third toe or of the calcaneus. She had gone to the vascular surgery office and they had noted that there is dehiscence of the left  part of the amputation site of the below-knee wound and they have asked [place] to kindly take over the care of this. There've been doing dressings for the right heel and right third toe. SEFORA, TIETJE ([id number]) 02/25/2015 -- we have received notes from the vascular group who saw her last on 02/13/2015 and she was seen by the PA Ms. Cleda Daub. She had recommended that the patient continue to follow with [place] for wound care including management of the left BKA stump, which has had dehiscence of the lateral part. They will consider repeating a arterial duplex or angiogram if the right lower extremity does not heal within a reasonable period of time. 03/18/2015 -- he saw the vascular surgeon Dr. Gilda Crease and he has set her up for an angioplasty sometime in the middle of July. she is doing well otherwise. 04/10/2015 -- the patient had a procedure done on 04/08/2015 and this was a right angioplasty of the dorsalis pedis, anterior tibial artery, first superficial femoral artery in its midportion. There was successful intervention with recanalization and in-line flow to the right foot. 04/22/2015 -- the patient was looking rather pale today and the daughter confirms that her hemoglobin was down to 6.9. she is being monitored by her PCP. Her Lasix dose was increased and her edema has gone down significantly. 04/29/2015 -- she had vascular studies done and the ABI on the right is 1.3 and the toe pressures were within normal limits. Her hemoglobin is still around 7 and she refuses to take any blood transfusions for religious reasons. Her potassium was normal. Objective Constitutional Pulse regular. Respirations normal and unlabored. Afebrile. Vitals Time Taken: 11:15 AM, Height: 62 in, Weight: 155 lbs, BMI: 28.3, Temperature: 97.8 F, Pulse: 45 bpm, Respiratory Rate: 18 breaths/min, Blood Pressure: 111/37 mmHg. Eyes Nonicteric. Reactive to light. Ears, Nose, Mouth, and Throat Lips, teeth, and  gums WNL.Marland Kitchen Moist mucosa without lesions . Neck supple and nontender. No palpable supraclavicular or cervical adenopathy. Normal sized without goiter. Respiratory WNL. No retractions.. Breath sounds WNL, No rubs, rales, rhonchi, or wheeze.. Cardiovascular Heart rhythm and rate regular, no murmur or gallop.. Pedal Pulses WNL. the right lower extremity and her left BKA stump continued to have a lot of edema and this may be due to her low oncotic pressure.Marland Kitchen Dissinger, [name] ([id number]) Lymphatic No adneopathy. No adenopathy. No adenopathy. Musculoskeletal Adexa without tenderness or enlargement.. Digits and nails w/o clubbing, cyanosis, infection, petechiae, ischemia, or inflammatory conditions.Marland Kitchen Psychiatric Judgement and insight Intact.. No evidence of depression, anxiety, or agitation.. General Notes: The wounds all look much better and there is minimal slough.  I believe some of the Santyll is oozing out of the wound bed and causing excoriation of the skin surrounding and we will stop using Santyl. Integumentary (Hair, Skin) No suspicious lesions. No crepitus or fluctuance. No peri-wound warmth or erythema. No masses.. Wound #1 status is Open. Original cause of wound was Gradually Appeared. The wound is located on the Right Calcaneous. The wound measures 1.6cm length x 1.8cm width x 0.2cm depth; 2.262cm^2 area and 0.452cm^3 volume. The wound is limited to skin breakdown. There is no tunneling or undermining noted. There is a medium amount of serosanguineous drainage noted. The wound margin is distinct with the outline attached to the wound base. There is medium (34-66%) pink, pale granulation within the wound bed. There is a medium (34-66%) amount of necrotic tissue within the wound bed including Adherent Slough. The periwound skin appearance exhibited: Localized Edema, Maceration, Moist. The periwound skin appearance did not exhibit: Callus, Crepitus, Excoriation, Fluctuance, Friable,  Induration, Rash, Scarring, Dry/Scaly, Atrophie Blanche, Cyanosis, Ecchymosis, Hemosiderin Staining, Mottled, Pallor, Rubor, Erythema. Periwound temperature was noted as No Abnormality. The periwound has tenderness on palpation. Wound #2 status is Open. Original cause of wound was Gradually Appeared. The wound is located on the Right Toe Third. The wound measures 0.5cm length x 0.6cm width x 0.1cm depth; 0.236cm^2 area and 0.024cm^3 volume. The wound is limited to skin breakdown. There is no tunneling or undermining noted. There is a medium amount of serosanguineous drainage noted. The wound margin is distinct with the outline attached to the wound base. There is large (67-100%) pink, pale granulation within the wound bed. There is a small (1-33%) amount of necrotic tissue within the wound bed including Adherent Slough. The periwound skin appearance did not exhibit: Callus, Crepitus, Excoriation, Fluctuance, Friable, Induration, Localized Edema, Rash, Scarring, Dry/Scaly, Maceration, Moist, Atrophie Blanche, Cyanosis, Ecchymosis, Hemosiderin Staining, Mottled, Pallor, Rubor, Erythema. Periwound temperature was noted as No Abnormality. Wound #3 status is Open. Original cause of wound was Surgical Injury. The wound is located on the Left Amputation Site - Below Knee. The wound measures 0.9cm length x 4.5cm width x 1cm depth; 3.181cm^2 area and 3.181cm^3 volume. The wound is limited to skin breakdown. There is no tunneling or undermining noted. There is a medium amount of serosanguineous drainage noted. The wound margin is distinct with the outline attached to the wound base. There is large (67-100%) pale granulation within the wound bed. There is a small (1-33%) amount of necrotic tissue within the wound bed. The periwound skin appearance exhibited: Localized Edema, Maceration, Moist. The periwound skin appearance did not exhibit: Callus, Crepitus, Excoriation, Fluctuance, Friable, Induration,  Rash, Scarring, Dry/Scaly, Atrophie Blanche, Cyanosis, Ecchymosis, Hemosiderin Staining, Mottled, Pallor, Rubor, Erythema. Periwound temperature [name], [name] ([id number]) was noted as No Abnormality. The periwound has tenderness on palpation. The wounds all look much better and there is minimal slough. I believe some of the Santyl is oozing out of the wound bed and causing excoriation of the skin surrounding and we will stop using Santyl. 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 L97.812 - Non-pressure chronic ulcer of other part of right lower leg with fat layer exposed T81.31XA - Disruption of external operation (surgical) wound, not elsewhere classified, initial encounter Diagnoses ICD-10 E11.621:  Type 2 diabetes mellitus with foot ulcer I70.235: Atherosclerosis of native arteries of right leg with ulceration of other part of foot Z89.512: Acquired absence of left leg below knee L97.412: Non-pressure chronic ulcer of right heel and midfoot with fat layer exposed L97.812: Non-pressure chronic ulcer of other part of right lower leg with fat layer exposed T81.31XA: Disruption of external operation (surgical) wound, not elsewhere classified, initial encounter I have recommended we use silver alginate on the right foot and iodoform gauze on the left BKA stump. They are working on her diuresis and increasing her hemoglobin and we will see her back next week. 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 There was a Skin/Subcutaneous Tissue Debridement ([id number]) debridement with total area of 2.88 sq cm performed by Tristan Schroeder., MD. with the following  instrument(s): Curette to remove Viable and Non-Viable tissue/material including Fibrin/Slough and Subcutaneous after achieving pain control using Lidocaine 4% Topical Solution. A time out was conducted prior to the start of the procedure. A Minimum amount of bleeding was controlled with Pressure. The procedure was tolerated well with a pain level of 0 throughout and a pain level of 0 following the procedure. Post Debridement Measurements: 1.6cm length x 1.8cm width x 0.2cm depth; 0.452cm^3 volume. [name], [name] ([id number]) Wound #3 Wound #3 is a Dehisced Wound located on the Left Amputation Site - Below Knee . There was a Skin/Subcutaneous Tissue Debridement ([id number]) debridement with total area of 4.05 sq cm performed by Tristan Schroeder., MD. with the following instrument(s): Curette to remove Viable and Non-Viable tissue/material including Fibrin/Slough and Subcutaneous after achieving pain control using Lidocaine 4% Topical Solution. A time out was conducted prior to the start of the procedure. A Minimum amount of bleeding was controlled with Pressure. The procedure was tolerated well with a pain level of 0 throughout and a pain level of 0 following the procedure. Post Debridement Measurements: 0.9cm length x 4.5cm width x 1cm depth; 3.181cm^3 volume. Plan Wound Cleansing: Wound #1 Right Calcaneous: Cleanse wound with mild soap and water May Shower, gently pat wound dry prior to applying new dressing. May shower with protection. Wound #2 Right Toe Third: Cleanse wound with mild soap and water May Shower, gently pat wound dry prior to applying new dressing. May shower with protection. Wound #3 Left Amputation Site - Below Knee: Cleanse wound with mild soap and water May Shower, gently pat wound dry prior to applying new dressing. May shower with protection. Primary Wound Dressing: Wound #1 Right Calcaneous: Aquacel Ag Wound #2 Right Toe Third: Aquacel Ag Wound #3 Left  Amputation Site - Below Knee: Iodoform packing Gauze Secondary Dressing: Wound #1 Right Calcaneous: Gauze and Kerlix/Conform XtraSorb Wound #2 Right Toe Third: Gauze and Kerlix/Conform Wound #3 Left Amputation Site - Below Knee: Gauze and Kerlix/Conform XtraSorb Dressing Change Frequency: Wound #1 Right Calcaneous: Change dressing every day. Wound #2 Right Toe ThirdKalyani [name], [name] ([id number]) Change dressing every day. Wound #3 Left Amputation Site - Below Knee: Change dressing every day. Follow-up Appointments: Wound #1 Right Calcaneous: Return Appointment in 1 week. Wound #2 Right Toe Third: Return Appointment in 1 week. 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 Home Health: Wound #1 Right Calcaneous: Continue Home Health Visits - [facility] Nurse may visit PRN to address patient s wound care needs. FACE TO FACE ENCOUNTER: MEDICARE and MEDICAID PATIENTS: I certify that this patient is under my care  and that I had a face-to-face encounter that meets the physician face-to-face encounter requirements with this patient on this date. The encounter with the patient was in whole or in part for the following MEDICAL CONDITION: (primary reason for Home Healthcare) MEDICAL NECESSITY: I certify, that based on my findings, NURSING services are a medically necessary home health service. HOME BOUND STATUS: I certify that my clinical findings support that this patient is homebound (i.e., Due to illness or injury, pt requires aid of supportive devices such as crutches, cane, wheelchairs, walkers, the use of special transportation or the assistance of another person to leave their place of residence. There is a normal inability to leave the home and doing so requires considerable and taxing effort. Other absences are for medical reasons / religious services and are infrequent or of short duration when for other reasons). If current dressing causes  regression in wound condition, may D/C ordered dressing product/s and apply Normal Saline Moist Dressing daily until next Wound Healing [name] / Other MD appointment. Notify Wound Healing [name] of regression in wound condition at [phone]. Please direct any NON-WOUND related issues/requests for orders to patient's Primary Care Physician Wound #2 Right Toe Third: Continue Home Health Visits - [facility] Nurse may visit PRN to address patient s wound care needs. FACE TO FACE ENCOUNTER: MEDICARE and MEDICAID PATIENTS: I certify that this patient is under my care and that I had a face-to-face encounter that meets the physician face-to-face encounter requirements with this patient on this date. The encounter with the patient was in whole or in part for the following MEDICAL CONDITION: (primary reason for Home Healthcare) MEDICAL NECESSITY: I certify, that based on my findings, NURSING services are a medically necessary home health service. HOME BOUND STATUS: I certify that my clinical findings support that this patient is homebound (i.e., Due to illness or injury, pt requires aid of supportive devices such as crutches, cane, wheelchairs, walkers, the use of special transportation or the assistance of another person to leave their place of residence. There is a normal inability to leave the home and doing so requires considerable and taxing effort. Other absences are for medical reasons / religious services and are infrequent or of short duration when for other reasons). If current dressing causes regression in wound condition, may D/C ordered dressing product/s and apply Normal Saline Moist Dressing daily until next Wound Healing [name] / Other MD appointment. Notify Wound Healing [name] of regression in wound condition at [phone]. Please direct any NON-WOUND related issues/requests for orders to patient's Primary Care Physician Wound #3 Left Amputation Site - Below  Knee: Continue Home Health Visits - [name] Nurse may visit PRN to address patient s wound care needs. FACE TO FACE ENCOUNTER: MEDICARE and MEDICAID PATIENTS: I certify that this patient is under my care and that I had a face-to-face encounter that meets the physician face-to-face encounter requirements with this patient on this date. The encounter with the patient was in whole or in part for the [name], [name] ([id number]) following MEDICAL CONDITION: (primary reason for Home Healthcare) MEDICAL NECESSITY: I certify, that based on my findings, NURSING services are a medically necessary home health service. HOME BOUND STATUS: I certify that my clinical findings support that this patient is homebound (i.e., Due to illness or injury, pt requires aid of supportive devices such as crutches, cane, wheelchairs, walkers, the use of special transportation or the assistance of another person to leave their place of residence. There is a  normal inability to leave the home and doing so requires considerable and taxing effort. Other absences are for medical reasons / religious services and are infrequent or of short duration when for other reasons). If current dressing causes regression in wound condition, may D/C ordered dressing product/s and apply Normal Saline Moist Dressing daily until next Wound Healing [name] / Other MD appointment. Notify Wound Healing [name] of regression in wound condition at [phone]. Please direct any NON-WOUND related issues/requests for orders to patient's Primary Care Physician Follow-Up Appointments: A Patient Clinical Summary of Care was provided to HS I have recommended we use silver alginate on the right foot and iodoform gauze on the left BKA stump. They are working on her diuresis and increasing her hemoglobin and we will see her back next week. Electronic Signature(s) Signed: 05/07/2015 10:19:05 AM By: Sallee Provencal, RRT,  CHT Signed: 05/08/2015 8:00:39 AM By: Evlyn Kanner MD, FACS Previous Signature: 05/06/2015 11:52:36 AM Version By: Evlyn Kanner MD, FACS Entered By: Dayton Martes on 05/07/2015 10:19:05 [name] ([id number]) -------------------------------------------------------------------------------- SuperBill Details Patient Name: [name] Date of Service: 05/06/2015 Medical Record Number: [id number] Patient Account Number: [id number] Date of Birth/Sex: Feb 21, 1921 (81 y.o. 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 L97.812 Non-pressure chronic ulcer of other part of right lower leg with fat layer exposed Disruption of external operation (surgical) wound, not elsewhere classified, initial T81.31XA encounter Facility Procedures CPT4: Description Modifier Quantity Code [id number] 11042 - DEB SUBQ TISSUE 20 SQ CM/< 1 ICD-10 Description Diagnosis E11.621 Type 2 diabetes mellitus with foot ulcer I70.235 Atherosclerosis of native arteries of right leg with ulceration of other part of  foot 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 L97.812 Non-pressure chronic ulcer of other part of right lower leg with fat layer exposed Physician Procedures CPT4: Description Modifier Quantity Code [id number] 11042 - WC PHYS SUBQ TISS 20 SQ CM 1 ICD-10 Description Diagnosis E11.621 Type 2 diabetes mellitus with foot ulcer I70.235 Atherosclerosis of native arteries of right leg with ulceration of other part of   foot 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

## 2015-05-13 ENCOUNTER — Encounter: Payer: Medicare Other | Admitting: Surgery

## 2015-05-13 DIAGNOSIS — L97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: Secondary | ICD-10-CM | POA: Diagnosis not present

## 2015-05-14 NOTE — Progress Notes (Signed)
HAYDN, CUSH ([id number]) Visit Report for 05/13/2015 Arrival Information Details Patient Name: [name], [name] Date of Service: 05/13/2015 11:45 AM Medical Record Number: [id number] Patient Account Number: [id number] Date of Birth/Sex: 09-15-1921 (81 y.o. Female) Treating RN: Curtis Sites Primary Care Physician: Dorothey Baseman Other Clinician: Referring Physician: Dorothey Baseman Treating Physician/Extender: Rudene Re in Treatment: 13 Visit Information History Since Last Visit Added or deleted any medications: No Patient Arrived: Wheel Chair Any new allergies or adverse reactions: No Arrival Time: 11:49 Had a fall or experienced change in No Accompanied By: dtr in law and activities of daily living that may affect son risk of falls: Transfer Assistance: Manual Signs or symptoms of abuse/neglect since last No Patient Identification Verified: Yes visito Secondary Verification Process Yes Hospitalized since last visit: No Completed: Pain Present Now: No Patient Has Alerts: Yes Patient Alerts: DMII Electronic Signature(s) Signed: 05/13/2015 4:16:34 PM By: Curtis Sites Entered By: Curtis Sites on 05/13/2015 11:49:56 [name] ([id number]) -------------------------------------------------------------------------------- Encounter Discharge Information Details Patient Name: [name] Date of Service: 05/13/2015 11:45 AM Medical Record Number: [id number] Patient Account Number: [id number] Date of Birth/Sex: 02-12-1921 (81 y.o. Female) Treating RN: Huel Coventry Primary Care Physician: Dorothey Baseman Other Clinician: Referring Physician: Dorothey Baseman Treating Physician/Extender: Rudene Re in Treatment: 48 Encounter Discharge Information Items Discharge Pain Level: 0 Discharge Condition: Stable Ambulatory Status: Wheelchair Discharge Destination: Home Transportation: Private Auto son and dtr in Accompanied By: law Schedule Follow-up  Appointment: Yes Medication Reconciliation completed and provided to Patient/Care No Keelia Graybill: Provided on Clinical Summary of Care: 05/20/2015 Form Type Recipient Paper Patient HS Electronic Signature(s) Signed: 05/13/2015 4:16:34 PM By: Curtis Sites Previous Signature: 05/13/2015 12:25:49 PM Version By: Gwenlyn Perking Entered By: Curtis Sites on 05/13/2015 12:28:40 [name] ([id number]) -------------------------------------------------------------------------------- Lower Extremity Assessment Details Patient Name: [name] Date of Service: 05/13/2015 11:45 AM Medical Record Number: [id number] Patient Account Number: [id number] Date of Birth/Sex: 1921/05/02 (81 y.o. Female) Treating RN: Curtis Sites Primary Care Physician: Dorothey Baseman Other Clinician: Referring Physician: Dorothey Baseman Treating Physician/Extender: Rudene Re in Treatment: 13 Vascular Assessment Pulses: Posterior Tibial Dorsalis Pedis Palpable: [Right:No] Extremity colors, hair growth, and conditions: Extremity Color: [Right:Normal] Hair Growth on Extremity: [Right:No] Temperature of Extremity: [Right:Warm] Capillary Refill: [Right:< 3 seconds] Toe Nail Assessment Left: Right: Thick: Yes Discolored: Yes Deformed: No Improper Length and Hygiene: No Electronic Signature(s) Signed: 05/13/2015 4:16:34 PM By: Curtis Sites Entered By: Curtis Sites on 05/13/2015 12:09:28 [name] ([id number]) -------------------------------------------------------------------------------- Multi Wound Chart Details Patient Name: [name] Date of Service: 05/13/2015 11:45 AM Medical Record Number: [id number] Patient Account Number: [id number] Date of Birth/Sex: 12/18/1920 (81 y.o. 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90%] [2:83.30%] [3:98.50%] % Reduction in Volume: 35.80% [2:83.00%] [3:97.00%] Classification: [1:Full Thickness Without Exposed Support Structures] [2:Full Thickness Without Exposed Support Structures] [3:Full Thickness Without Exposed Support Structures] HBO Classification: [1:Grade 1] [2:Grade 1] [3:Grade 1] Exudate Amount: [1:Medium] [2:Medium] [3:Medium] Exudate Type: [1:Serosanguineous] [2:Serosanguineous] [3:Serosanguineous] Exudate Color: [1:red, brown] [2:red, brown] [3:red, brown] Wound Margin: [1:Distinct, outline attached Distinct, outline attached Distinct, outline attached] Granulation Amount: [1:Large (67-100%)] [2:Large (67-100%)] [3:Large (67-100%)] Granulation Quality: [1:Pink, Pale] [2:Pink, Pale] [3:Pale] Necrotic Amount: [1:Small (1-33%)] [2:None Present (0%)] [3:Small (1-33%)] Exposed  Structures: Coats, Ellah ([id number]) Fascia: No Fascia: No Fascia: No Fat: No Fat: No Fat: No Tendon: No Tendon: No Tendon: No Muscle: No Muscle: No Muscle: No Joint: No Joint: No Joint: No Bone: No Bone: No Bone: No Limited to Skin Limited to Skin Limited to Skin Breakdown Breakdown Breakdown Epithelialization: None Small (1-33%) Medium (34-66%) Periwound Skin Texture: Edema: Yes Edema: No Edema: Yes Excoriation: No Excoriation: No Excoriation: No Induration: No Induration: No Induration: No Callus: No Callus: No Callus: No Crepitus: No Crepitus: No Crepitus: No Fluctuance: No Fluctuance: No Fluctuance: No Friable: No Friable: No Friable: No Rash: No Rash: No Rash: No Scarring: No Scarring: No Scarring: No Periwound Skin Maceration: Yes Maceration: No Maceration: Yes Moisture: Moist: Yes Moist: No Moist: Yes Dry/Scaly: No Dry/Scaly: No Dry/Scaly: No Periwound Skin Color: Atrophie Blanche: No Atrophie Blanche: No Atrophie Blanche: No Cyanosis: No Cyanosis: No Cyanosis: No Ecchymosis: No Ecchymosis: No Ecchymosis: No Erythema: No Erythema: No Erythema: No Hemosiderin Staining: No Hemosiderin Staining: No Hemosiderin Staining: No Mottled: No Mottled: No Mottled: No Pallor: No Pallor: No Pallor: No Rubor: No Rubor: No Rubor: No Temperature: No Abnormality No Abnormality No Abnormality Tenderness on Yes No Yes Palpation: Wound Preparation: Ulcer Cleansing: Ulcer Cleansing: Ulcer Cleansing: Rinsed/Irrigated with Rinsed/Irrigated with Rinsed/Irrigated with Saline Saline Saline Topical Anesthetic Topical Anesthetic Topical Anesthetic Applied: Other: lidocaine Applied: None Applied: None 4% Treatment Notes Electronic Signature(s) Signed: 05/13/2015 4:16:34 PM By: Curtis Sites Entered By: Curtis Sites on 05/13/2015 12:11:09 [name]  ([id number]) -------------------------------------------------------------------------------- Multi-Disciplinary Care Plan Details Patient Name: [name] Date of Service: 05/13/2015 11:45 AM Medical Record Number: [id number] Patient Account Number: [id number] Date of Birth/Sex: 07-Jan-1921 (81 y.o. Female) Treating RN: Curtis Sites Primary Care Physician: Dorothey Baseman Other Clinician: Referring Physician: Dorothey Baseman Treating Physician/Extender: Rudene Re in Treatment: 28 Active Inactive Abuse / Safety / Falls / Self Care Management Nursing Diagnoses: Impaired physical mobility Potential for falls Goals: Patient will remain injury free Date Initiated: 02/11/2015 Goal Status: Active Patient/caregiver will verbalize understanding of skin care regimen Date Initiated: 02/11/2015 Goal Status: Active Patient/caregiver will verbalize/demonstrate measures taken to prevent injury and/or falls Date Initiated: 02/11/2015 Goal Status: Active Patient/caregiver will verbalize/demonstrate understanding of what to do in case of emergency Date Initiated: 02/11/2015 Goal Status: Active Interventions: Assess fall risk on admission and as needed Provide education on fall prevention Provide education on safe transfers Treatment Activities: Patient referred to home care : 05/13/2015 Notes: Nutrition Nursing Diagnoses: Imbalanced nutrition Potential for alteratiion in Nutrition/Potential for imbalanced nutrition Hollerbach, Swannie ([id number]) Goals: Patient/caregiver verbalizes understanding of need to maintain therapeutic glucose control per primary care physician Date Initiated: 02/11/2015 Goal Status: Active Patient/caregiver will maintain therapeutic glucose control Date Initiated: 02/11/2015 Goal Status: Active Interventions: Assess HgA1c results as ordered upon admission and as needed Provide education on elevated blood sugars and impact on wound healing Provide  education on nutrition Treatment Activities: Education  provided on Nutrition : 04/22/2015 Obtain HgA1c : 05/13/2015 Notes: Wound/Skin Impairment Nursing Diagnoses: Impaired tissue integrity Knowledge deficit related to ulceration/compromised skin integrity Goals: Patient/caregiver will verbalize understanding of skin care regimen Date Initiated: 02/11/2015 Goal Status: Active Ulcer/skin breakdown will heal within 14 weeks Date Initiated: 02/11/2015 Goal Status: Active Interventions: Assess patient/caregiver ability to obtain necessary supplies Assess patient/caregiver ability to perform ulcer/skin care regimen upon admission and as needed Assess ulceration(s) every visit Provide education on ulcer and skin care Treatment Activities: Skin care regimen initiated : 05/13/2015 Topical wound management initiated : 05/13/2015 Notes: MYLA, MAURIELLO ([id number]) Electronic Signature(s) Signed: 05/13/2015 4:16:34 PM By: Curtis Sites Entered By: Curtis Sites on 05/13/2015 12:10:56 [name] ([id number]) -------------------------------------------------------------------------------- Patient/Caregiver Education Details Patient Name: [name] Date of Service: 05/13/2015 11:45 AM Medical Record Number: [id number] Patient Account Number: [id number] Date of Birth/Gender: Jun 06, 1921 (81 y.o. Female) Treating RN: Curtis Sites Primary Care Physician: Dorothey Baseman Other Clinician: Referring Physician: Dorothey Baseman Treating Physician/Extender: Rudene Re in Treatment: 13 Education Assessment Education Provided To: Patient and Caregiver Education Topics Provided Wound/Skin Impairment: Handouts: Other: wound care as ordered Methods: Demonstration, Explain/Verbal Responses: State content correctly Electronic Signature(s) Signed: 05/13/2015 4:16:34 PM By: Curtis Sites Entered By: Curtis Sites on 05/13/2015 12:28:55 [name]  ([id number]) -------------------------------------------------------------------------------- Wound Assessment Details Patient Name: [name] Date of Service: 05/13/2015 11:45 AM Medical Record Number: [id number] Patient Account Number: [id number] Date of Birth/Sex: 02-May-1921 (81 y.o. Female) Treating RN: Curtis Sites Primary Care Physician: Dorothey Baseman Other Clinician: Referring Physician: Terance Hart, DAVID Treating Physician/Extender: Rudene Re in Treatment: 13 Wound Status Wound Number: 1 Primary Arterial Insufficiency Ulcer Etiology: Wound Location: Right Calcaneous Wound Open Wounding Event: Gradually Appeared Status: Date Acquired: 11/04/2014 Comorbid Arrhythmia, Deep Vein Thrombosis, Weeks Of Treatment: 13 History: Peripheral Arterial Disease, Type II Clustered Wound: No Diabetes Photos Photo Uploaded By: Curtis Sites on 05/13/2015 15:56:13 Wound Measurements Length: (cm) 1.5 Width: (cm) 1.5 Depth: (cm) 0.2 Area: (cm) 1.767 Volume: (cm) 0.353 % Reduction in Area: 67.9% % Reduction in Volume: 35.8% Epithelialization: None Tunneling: No Undermining: No Wound Description Full Thickness Without Exposed Foul Odor Af Classification: Support Structures Diabetic Severity Grade 1 (Wagner): Wound Margin: Distinct, outline attached Exudate Amount: Medium Exudate Type: Serosanguineous Exudate Color: red, brown ter Cleansing: No Wound Bed Granulation Amount: Large (67-100%) Exposed Structure Gaba, Cuca ([id number]) Granulation Quality: Pink, Pale Fascia Exposed: No Necrotic Amount: Small (1-33%) Fat Layer Exposed: No Necrotic Quality: Adherent Slough Tendon Exposed: No Muscle Exposed: No Joint Exposed: No Bone Exposed: No Limited to Skin Breakdown Periwound Skin Texture Texture Color No Abnormalities Noted: No No Abnormalities Noted: No Callus: No Atrophie Blanche: No Crepitus: No Cyanosis: No Excoriation: No Ecchymosis:  No Fluctuance: No Erythema: No Friable: No Hemosiderin Staining: No Induration: No Mottled: No Localized Edema: Yes Pallor: No Rash: No Rubor: No Scarring: No Temperature / Pain Moisture Temperature: No Abnormality No Abnormalities Noted: No Tenderness on Palpation: Yes Dry / Scaly: No Maceration: Yes Moist: Yes Wound Preparation Ulcer Cleansing: Rinsed/Irrigated with Saline Topical Anesthetic Applied: Other: lidocaine 4%, Treatment Notes Wound #1 (Right Calcaneous) 1. Cleansed with: Clean wound with Normal Saline 2. Anesthetic Topical Lidocaine 4% cream to wound bed prior to debridement 4. Dressing Applied: Aquacel Ag 5. Secondary Dressing Applied Bordered Foam Dressing Gauze and Kerlix/Conform 7. Secured with Secretary/administrator) Signed: 05/13/2015 4:16:34 PM By: Mechele Dawley, Shanty ([id number]) Entered By: Curtis Sites on 05/13/2015 12:09:49 [name] ([id number]) -------------------------------------------------------------------------------- Wound Assessment Details Patient Name: [name] Date  of Service: 05/13/2015 11:45 AM Medical Record Number: [id number] Patient Account Number: [id number] Date of Birth/Sex: 18-May-1921 (81 y.o. Female) Treating RN: Curtis Sites Primary Care Physician: Dorothey Baseman Other Clinician: Referring Physician: Terance Hart, DAVID Treating Physician/Extender: Rudene Re in Treatment: 13 Wound Status Wound Number: 2 Primary Arterial Insufficiency Ulcer Etiology: Wound Location: Right Toe Third Wound Open Wounding Event: Gradually Appeared Status: Date Acquired: 11/04/2014 Comorbid Arrhythmia, Deep Vein Thrombosis, Weeks Of Treatment: 13 History: Peripheral Arterial Disease, Type II Clustered Wound: No Diabetes Photos Photo Uploaded By: Curtis Sites on 05/13/2015 15:56:13 Wound Measurements Length: (cm) 0.5 Width: (cm) 0.4 Depth: (cm) 0.1 Area: (cm) 0.157 Volume: (cm)  0.016 % Reduction in Area: 83.3% % Reduction in Volume: 83% Epithelialization: Small (1-33%) Tunneling: No Undermining: No Wound Description Full Thickness Without Exposed Classification: Support Structures Diabetic Severity Grade 1 (Wagner): Wound Margin: Distinct, outline attached Exudate Amount: Medium Exudate Type: Serosanguineous Exudate Color: red, brown Foul Odor After Cleansing: No Wound Bed Granulation Amount: Large (67-100%) Exposed Structure Dietze, Livie ([id number]) Granulation Quality: Pink, Pale Fascia Exposed: No Necrotic Amount: None Present (0%) Fat Layer Exposed: No Tendon Exposed: No Muscle Exposed: No Joint Exposed: No Bone Exposed: No Limited to Skin Breakdown Periwound Skin Texture Texture Color No Abnormalities Noted: No No Abnormalities Noted: No Callus: No Atrophie Blanche: No Crepitus: No Cyanosis: No Excoriation: No Ecchymosis: No Fluctuance: No Erythema: No Friable: No Hemosiderin Staining: No Induration: No Mottled: No Localized Edema: No Pallor: No Rash: No Rubor: No Scarring: No Temperature / Pain Moisture Temperature: No Abnormality No Abnormalities Noted: No Dry / Scaly: No Maceration: No Moist: No Wound Preparation Ulcer Cleansing: Rinsed/Irrigated with Saline Topical Anesthetic Applied: None Treatment Notes Wound #2 (Right Toe Third) 1. Cleansed with: Clean wound with Normal Saline 2. Anesthetic Topical Lidocaine 4% cream to wound bed prior to debridement 4. Dressing Applied: Aquacel Ag 5. Secondary Dressing Applied Bordered Foam Dressing Gauze and Kerlix/Conform 7. Secured with Secretary/administrator) Signed: 05/13/2015 4:16:34 PM By: Mechele Dawley, Blu ([id number]) Entered By: Curtis Sites on 05/13/2015 12:10:25 [name] ([id number]) -------------------------------------------------------------------------------- Wound Assessment Details Patient Name: [name] Date of  Service: 05/13/2015 11:45 AM Medical Record Number: [id number] Patient Account Number: [id number] Date of Birth/Sex: 05-15-1921 (81 y.o. Female) Treating RN: Curtis Sites Primary Care Physician: Dorothey Baseman Other Clinician: Referring Physician: Terance Hart, DAVID Treating Physician/Extender: Rudene Re in Treatment: 13 Wound Status Wound Number: 3 Primary Dehisced Wound Etiology: Wound Location: Left Amputation Site - Below Knee Wound Open Status: Wounding Event: Surgical Injury Comorbid Arrhythmia, Deep Vein Thrombosis, Date Acquired: 01/03/2015 History: Peripheral Arterial Disease, Type II Weeks Of Treatment: 12 Diabetes Clustered Wound: No Photos Photo Uploaded By: Curtis Sites on 05/13/2015 15:56:30 Wound Measurements Length: (cm) 0.2 Width: (cm) 1 Depth: (cm) 0.8 Area: (cm) 0.157 Volume: (cm) 0.126 % Reduction in Area: 98.5% % Reduction in Volume: 97% Epithelialization: Medium (34-66%) Tunneling: No Undermining: No Wound Description Full Thickness Without Exposed Foul Odor A Classification: Support Structures Diabetic Severity Grade 1 (Wagner): Wound Margin: Distinct, outline attached Exudate Amount: Medium Exudate Type: Serosanguineous Exudate Color: red, brown fter Cleansing: No Wound Bed Granulation Amount: Large (67-100%) Exposed Structure Kensinger, Mishka ([id number]) Granulation Quality: Pale Fascia Exposed: No Necrotic Amount: Small (1-33%) Fat Layer Exposed: No Tendon Exposed: No Muscle Exposed: No Joint Exposed: No Bone Exposed: No Limited to Skin Breakdown Periwound Skin Texture Texture Color No Abnormalities Noted: No No Abnormalities Noted: No Callus: No Atrophie Blanche: No Crepitus: No Cyanosis: No Excoriation: No Ecchymosis: No  Fluctuance: No Erythema: No Friable: No Hemosiderin Staining: No Induration: No Mottled: No Localized Edema: Yes Pallor: No Rash: No Rubor: No Scarring: No Temperature /  Pain Moisture Temperature: No Abnormality No Abnormalities Noted: No Tenderness on Palpation: Yes Dry / Scaly: No Maceration: Yes Moist: Yes Wound Preparation Ulcer Cleansing: Rinsed/Irrigated with Saline Topical Anesthetic Applied: None Treatment Notes Wound #3 (Left Amputation Site - Below Knee) 1. Cleansed with: Clean wound with Normal Saline 4. Dressing Applied: Iodoform packing Gauze 5. Secondary Dressing Applied Non-Adherent pad 7. Secured with Secretary/administrator) Signed: 05/13/2015 4:16:34 PM By: Curtis Sites Entered By: Curtis Sites on 05/13/2015 12:10:44 [name] ([id number]) -------------------------------------------------------------------------------- Vitals Details Patient Name: [name] Date of Service: 05/13/2015 11:45 AM Medical Record Number: [id number] Patient Account Number: [id number] Date of Birth/Sex: 1921/06/06 (81 y.o. Female) Treating RN: Curtis Sites Primary Care Physician: Dorothey Baseman Other Clinician: Referring Physician: Terance Hart, DAVID Treating Physician/Extender: Rudene Re in Treatment: 13 Vital Signs Time Taken: 11:50 Temperature (F): 97.7 Height (in): 62 Pulse (bpm): 85 Weight (lbs): 155 Respiratory Rate (breaths/min): 18 Body Mass Index (BMI): 28.3 Blood Pressure (mmHg): 120/85 Reference Range: 80 - 120 mg / dl Electronic Signature(s) Signed: 05/13/2015 4:16:34 PM By: Curtis Sites Entered By: Curtis Sites on 05/13/2015 11:54:27

## 2015-05-15 NOTE — Progress Notes (Signed)
[name], [name] ([id number]) Visit Report for 05/13/2015 Chief Complaint Document Details Patient Name: [name], [name] Date of Service: 05/13/2015 11:45 AM Medical Record Number: [id number] Patient Account Number: [id number] Date of Birth/Sex: Jun 26, 1921 (81 y.o. Female) Treating RN: Huel Coventry Primary Care Physician: Dorothey Baseman Other Clinician: Referring Physician: Dorothey Baseman Treating Physician/Extender: Rudene Re in Treatment: 13 Information Obtained from: Patient Chief Complaint Patient presents to the wound care center for a consult due non healing wound. 81 year old patient with comes with a history of a ulcerated area to the right third toe and the right heel which she's had for about 2 months. Electronic Signature(s) Signed: 05/13/2015 12:22:01 PM By: Evlyn Kanner MD, FACS Entered By: Evlyn Kanner on 05/13/2015 12:22:01 [name] ([id number]) -------------------------------------------------------------------------------- Debridement Details Patient Name: [name] Date of Service: 05/13/2015 11:45 AM Medical Record Number: [id number] Patient Account Number: [id number] Date of Birth/Sex: [birth year] (81 y.o. Female) Treating RN: Huel Coventry Primary Care Physician: Terance Hart, DAVID Other Clinician: Referring Physician: Dorothey Baseman Treating Physician/Extender: Rudene Re in Treatment: 13 Debridement Performed for Wound #1 Right Calcaneous Assessment: Performed By: Physician Tristan Schroeder., MD Debridement: Debridement Pre-procedure Yes Verification/Time Out Taken: Start Time: 12:10 Pain Control: Lidocaine 4% Topical Solution Level: Skin/Subcutaneous Tissue Total Area Debrided (L x 1.5 (cm) x 1.5 (cm) = 2.25 (cm) W): Tissue and other Viable, Non-Viable, Callus, Fibrin/Slough, Subcutaneous material debrided: Instrument: Curette Bleeding: Minimum Hemostasis Achieved: Pressure End Time: 12:12 Procedural Pain: 0 Post  Procedural Pain: 0 Response to Treatment: Procedure was tolerated well Post Debridement Measurements of Total Wound Length: (cm) 1.5 Width: (cm) 1.5 Depth: (cm) 0.2 Volume: (cm) 0.353 Electronic Signature(s) Signed: 05/13/2015 12:21:53 PM By: Evlyn Kanner MD, FACS Signed: 05/14/2015 5:06:50 PM By: Elliot Gurney RN, BSN, Kim RN, BSN Entered By: Evlyn Kanner on 05/13/2015 12:21:53 [name] ([id number]) -------------------------------------------------------------------------------- HPI Details Patient Name: [name] Date of Service: 05/13/2015 11:45 AM Medical Record Number: [id number] Patient Account Number: [id number] Date of Birth/Sex: 1921-02-25 (81 y.o. Female) Treating RN: Huel Coventry Primary Care Physician: Dorothey Baseman Other Clinician: Referring Physician: Dorothey Baseman Treating Physician/Extender: Rudene Re in Treatment: 13 History of Present Illness Location: right medial heel and right third toe Quality: Patient reports experiencing a dull pain to affected area(s). Severity: Patient states wound are getting worse. Duration: Patient has had the wound for > 3 months prior to seeking treatment at the wound center Timing: Pain in wound is Intermittent (comes and goes Context: The wound appeared gradually over time Modifying Factors: Consults to this date include:vascular surgeon and several recent surgeries. Associated Signs and Symptoms: Patient reports having foul odor and drainage from the left below-knee amputation site. HPI Description: A pleasant 81 year old patient who is known to have diabetes mellitus for several years has recently gone through a series of operations with the vascular surgeon Dr. Gilda [name]. In January and February she had several surgeries on the left lower extremity with an attempt to have limb salvage for gangrenous changes of her forefoot. Besides the surgery she also had a transmetatarsal amputation but ultimately she ended up  with a left BKA on 01/03/2015. On 01/07/2015 she also had a right lower extremity distal runoff with a angioplasty of the right anterior tibial artery to maximize her blood flow to the foot. She recently had her staples removed at Dr. Marijean Heath office on 01/31/2015 and at that time a right lower extremity duplex was done which showed patent vessels and a patent stent with distal occluded posterior tibial artery. Though the duplex was  noncritical the recommendations from the PA at the vascular surgery office was that of a angiogram to be done but the patient said she would rather try some bone care before. The patient has received doxycycline and Cipro in the recent past and she takes oral medications for her diabetes. Other than that I reviewed her list of all her medications. No recent hemoglobin A1c has been done and no recent x-rays of the right foot have been done. 02/18/2015 -- x-ray of the right foot was done on 02/11/2015 and it shows no evidence of acute osteomyelitis of the third toe or of the calcaneus. She had gone to the vascular surgery office and they had noted that there is dehiscence of the left part of the amputation site of the below-knee wound and they have asked [place] to kindly take over the care of this. There've been doing dressings for the right heel and right third toe. 02/25/2015 -- we have received notes from the vascular group who saw her last on 02/13/2015 and she was seen by the PA Ms. Cleda Daub. She had recommended that the patient continue to follow with [place] for wound care including management of the left BKA stump, which has had dehiscence of the lateral part. They will consider repeating a arterial duplex or angiogram if the right lower extremity does not heal within a reasonable period of time. 03/18/2015 -- he saw the vascular surgeon Dr. Gilda [name] and he has set her up for an angioplasty sometime in the middle of July. she is doing well otherwise. ZULY, BELKIN ([id number]) 04/10/2015 -- the patient had a procedure done on 04/08/2015 and this was a right angioplasty of the dorsalis pedis, anterior tibial artery, first superficial femoral artery in its midportion. There was successful intervention with recanalization and in-line flow to the right foot. 04/22/2015 -- the patient was looking rather pale today and the daughter confirms that her hemoglobin was down to 6.9. she is being monitored by her PCP. Her Lasix dose was increased and her edema has gone down significantly. 04/29/2015 -- she had vascular studies done and the ABI on the right is 1.3 and the toe pressures were within normal limits. Her hemoglobin is still around 7 and she refuses to take any blood transfusions for religious reasons. Her potassium was normal. Electronic Signature(s) Signed: 05/13/2015 12:22:06 PM By: Evlyn Kanner MD, FACS Entered By: Evlyn Kanner on 05/13/2015 12:22:06 [name] ([id number]) -------------------------------------------------------------------------------- Physical Exam Details Patient Name: [name] Date of Service: 05/13/2015 11:45 AM Medical Record Number: [id number] Patient Account Number: [id number] Date of Birth/Sex: 1921-09-27 (81 y.o. Female) Treating RN: Huel Coventry Primary Care Physician: Dorothey Baseman Other Clinician: Referring Physician: Dorothey Baseman Treating Physician/Extender: Rudene Re in Treatment: 13 Constitutional . Pulse regular. Respirations normal and unlabored. Afebrile. . Eyes Nonicteric. Reactive to light. Ears, Nose, Mouth, and Throat Lips, teeth, and gums WNL.Marland Kitchen Moist mucosa without lesions . Neck supple and nontender. No palpable supraclavicular or cervical adenopathy. Normal sized without goiter. Respiratory WNL. No retractions.. Cardiovascular Pedal Pulses WNL. No clubbing, cyanosis or edema. Chest Breasts symmetical and no nipple discharge.. Breast tissue WNL, no masses, lumps, or  tenderness.. Lymphatic No adneopathy. No adenopathy. No adenopathy. Musculoskeletal Adexa without tenderness or enlargement.. Digits and nails w/o clubbing, cyanosis, infection, petechiae, ischemia, or inflammatory conditions.. Integumentary (Hair, Skin) No suspicious lesions. No crepitus or fluctuance. No peri-wound warmth or erythema. No masses.Marland Kitchen Psychiatric Judgement and insight Intact.. No evidence of depression, anxiety, or agitation.. Notes The left BKA  stump looks excellent and there is minimal depth to the medial wound which will be packed with quarter-inch iodoform gauze. The right calcaneum is in need of sharp debridement with a curette and we will continue with silver alginate on both the wounds on the right foot. Electronic Signature(s) Signed: 05/13/2015 12:23:14 PM By: Evlyn Kanner MD, FACS Entered By: Evlyn Kanner on 05/13/2015 12:23:14 [name] ([id number]) -------------------------------------------------------------------------------- Physician Orders Details Patient Name: [name] Date of Service: 05/13/2015 11:45 AM Medical Record Number: [id number] Patient Account Number: [id number] Date of Birth/Sex: [birth year] (81 y.o. Female) Treating RN: Curtis Sites Primary Care Physician: Dorothey Baseman Other Clinician: Referring Physician: Dorothey Baseman Treating Physician/Extender: Rudene Re in Treatment: 66 Verbal / Phone Orders: Yes Clinician: Curtis Sites Read Back and Verified: Yes Diagnosis Coding Wound Cleansing Wound #1 Right Calcaneous o Cleanse wound with mild soap and water o May Shower, gently pat wound dry prior to applying new dressing. o May shower with protection. Wound #2 Right Toe Third o Cleanse wound with mild soap and water o May Shower, gently pat wound dry prior to applying new dressing. o May shower with protection. Wound #3 Left Amputation Site - Below Knee o Cleanse wound with mild soap and  water o May Shower, gently pat wound dry prior to applying new dressing. o May shower with protection. Primary Wound Dressing Wound #1 Right Calcaneous o Aquacel Ag Wound #2 Right Toe Third o Aquacel Ag Wound #3 Left Amputation Site - Below Knee o Iodoform packing Gauze Secondary Dressing Wound #1 Right Calcaneous o Gauze and Kerlix/Conform o XtraSorb Wound #2 Right Toe Third o Gauze and Kerlix/Conform Wound #3 Left Amputation Site - Below Knee o Gauze and Kerlix/Conform o Astrid Drafts, Ziaire ([id number]) Dressing Change Frequency Wound #1 Right Calcaneous o Change dressing every day. Wound #2 Right Toe Third o Change dressing every day. Wound #3 Left Amputation Site - Below Knee o Change dressing every day. Follow-up Appointments Wound #1 Right Calcaneous o Return Appointment in 1 week. Wound #2 Right Toe Third o Return Appointment in 1 week. Wound #3 Left Amputation Site - Below Knee o Return Appointment in 1 week. Home Health Wound #1 Right Calcaneous o Continue Home Health Visits - [facility] o Home Health Nurse may visit PRN to address patientos wound care needs. o FACE TO FACE ENCOUNTER: MEDICARE and MEDICAID PATIENTS: I certify that this patient is under my care and that I had a face-to-face encounter that meets the physician face-to-face encounter requirements with this patient on this date. The encounter with the patient was in whole or in part for the following MEDICAL CONDITION: (primary reason for Home Healthcare) MEDICAL NECESSITY: I certify, that based on my findings, NURSING services are a medically necessary home health service. HOME BOUND STATUS: I certify that my clinical findings support that this patient is homebound (i.e., Due to illness or injury, pt requires aid of supportive devices such as crutches, cane, wheelchairs, walkers, the use of special transportation or the assistance of another person to leave  their place of residence. There is a normal inability to leave the home and doing so requires considerable and taxing effort. Other absences are for medical reasons / religious services and are infrequent or of short duration when for other reasons). o If current dressing causes regression in wound condition, may D/C ordered dressing product/s and apply Normal Saline Moist Dressing daily until next Wound Healing Center / Other MD appointment. Notify Wound Healing Center of regression in wound condition  at [phone]. o Please direct any NON-WOUND related issues/requests for orders to patient's Primary Care Physician Wound #2 Right Toe Third o Continue Home Health Visits - [facility] o Home Health Nurse may visit PRN to address patientos wound care needs. o FACE TO FACE ENCOUNTER: MEDICARE and MEDICAID PATIENTS: I certify that this patient is under my care and that I had a face-to-face encounter that meets the physician face-to-face ELSBERRY, Desa ([id number]) encounter requirements with this patient on this date. The encounter with the patient was in whole or in part for the following MEDICAL CONDITION: (primary reason for Home Healthcare) MEDICAL NECESSITY: I certify, that based on my findings, NURSING services are a medically necessary home health service. HOME BOUND STATUS: I certify that my clinical findings support that this patient is homebound (i.e., Due to illness or injury, pt requires aid of supportive devices such as crutches, cane, wheelchairs, walkers, the use of special transportation or the assistance of another person to leave their place of residence. There is a normal inability to leave the home and doing so requires considerable and taxing effort. Other absences are for medical reasons / religious services and are infrequent or of short duration when for other reasons). o If current dressing causes regression in wound condition, may D/C ordered dressing  product/s and apply Normal Saline Moist Dressing daily until next Wound Healing Center / Other MD appointment. Notify Wound Healing Center of regression in wound condition at [phone]. o Please direct any NON-WOUND related issues/requests for orders to patient's Primary Care Physician Wound #3 Left Amputation Site - Below Knee o Continue Home Health Visits - [facility] o Home Health Nurse may visit PRN to address patientos wound care needs. o FACE TO FACE ENCOUNTER: MEDICARE and MEDICAID PATIENTS: I certify that this patient is under my care and that I had a face-to-face encounter that meets the physician face-to-face encounter requirements with this patient on this date. The encounter with the patient was in whole or in part for the following MEDICAL CONDITION: (primary reason for Home Healthcare) MEDICAL NECESSITY: I certify, that based on my findings, NURSING services are a medically necessary home health service. HOME BOUND STATUS: I certify that my clinical findings support that this patient is homebound (i.e., Due to illness or injury, pt requires aid of supportive devices such as crutches, cane, wheelchairs, walkers, the use of special transportation or the assistance of another person to leave their place of residence. There is a normal inability to leave the home and doing so requires considerable and taxing effort. Other absences are for medical reasons / religious services and are infrequent or of short duration when for other reasons). o If current dressing causes regression in wound condition, may D/C ordered dressing product/s and apply Normal Saline Moist Dressing daily until next Wound Healing Center / Other MD appointment. Notify Wound Healing Center of regression in wound condition at [phone]. o Please direct any NON-WOUND related issues/requests for orders to patient's Primary Care Physician Electronic Signature(s) Signed: 05/13/2015 12:24:58 PM By:  Evlyn Kanner MD, FACS Signed: 05/13/2015 4:16:34 PM By: Curtis Sites Entered By: Curtis Sites on 05/13/2015 12:12:01 [name] ([id number]) -------------------------------------------------------------------------------- Problem List Details Patient Name: [name] Date of Service: 05/13/2015 11:45 AM Medical Record Number: [id number] Patient Account Number: [id number] Date of Birth/Sex: [birth year] (81 y.o. 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/11/2015 Yes fat layer exposed L97.812 Non-pressure chronic ulcer of other part of right lower leg 02/11/2015 Yes with fat layer exposed T81.31XA Disruption of external operation (surgical) wound, not 02/18/2015 Yes elsewhere classified, initial encounter Inactive Problems Resolved Problems Electronic Signature(s) Signed: 05/13/2015 12:21:46 PM By: Evlyn Kanner MD, FACS Entered By: Evlyn Kanner on 05/13/2015 12:21:46 [name] ([id number]) -------------------------------------------------------------------------------- Progress Note Details Patient Name: [name] Date of Service: 05/13/2015 11:45 AM Medical Record Number: [id number] Patient Account Number: [id number] Date of Birth/Sex: [birth year] (81 y.o. Female) Treating RN: Huel Coventry Primary Care Physician: Dorothey Baseman Other Clinician: Referring Physician: Dorothey Baseman Treating Physician/Extender: Rudene Re in Treatment:  13 Subjective Chief Complaint Information obtained from Patient Patient presents to the wound care center for a consult due non healing wound. 81 year old patient with comes with a history of a ulcerated area to the right third toe and the right heel which she's had for about 2 months. History of Present Illness (HPI) The following HPI elements were documented for the patient's wound: Location: right medial heel and right third toe Quality: Patient reports experiencing a dull pain to affected area(s). Severity: Patient states wound are getting worse. Duration: Patient has had the wound for > 3 months prior to seeking treatment at the wound center Timing: Pain in wound is Intermittent (comes and goes Context: The wound appeared gradually over time Modifying Factors: Consults to this date include:vascular surgeon and several recent surgeries. Associated Signs and Symptoms: Patient reports having foul odor and drainage from the left below-knee amputation site. A pleasant 81 year old patient who is known to have diabetes mellitus for several years has recently gone through a series of operations with the vascular surgeon Dr. Gilda [name]. In January and February she had several surgeries on the left lower extremity with an attempt to have limb salvage for gangrenous changes of her forefoot. 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 On 01/07/2015 she also had a right lower extremity distal runoff with a angioplasty of the right anterior tibial artery to maximize her blood flow to the foot. She recently had her staples removed at Dr. Marijean Heath office on 01/31/2015 and at that time a right lower extremity duplex was done which showed patent vessels and a patent stent with distal occluded posterior tibial artery. Though the duplex was noncritical the recommendations from the PA at the vascular surgery office was that of a  angiogram to be done but the patient said she would rather try some bone care before. The patient has received doxycycline and Cipro in the recent past and she takes oral medications for her diabetes. Other than that I reviewed her list of all her medications. No recent hemoglobin A1c has been done and no recent x-rays of the right foot have been done. 02/18/2015 -- x-ray of the right foot was done on 02/11/2015 and it shows no evidence of acute osteomyelitis of the third toe or of the calcaneus. She had gone to the vascular surgery office and they had noted that there is dehiscence of the left part of the amputation site of the below-knee wound and they have asked [place] to kindly take over  the care of this. There've been doing dressings for the right heel and right third toe. JESENYA, BOWDITCH ([id number]) 02/25/2015 -- we have received notes from the vascular group who saw her last on 02/13/2015 and she was seen by the PA Ms. Cleda Daub. She had recommended that the patient continue to follow with [place] for wound care including management of the left BKA stump, which has had dehiscence of the lateral part. They will consider repeating a arterial duplex or angiogram if the right lower extremity does not heal within a reasonable period of time. 03/18/2015 -- he saw the vascular surgeon Dr. Gilda [name] and he has set her up for an angioplasty sometime in the middle of July. she is doing well otherwise. 04/10/2015 -- the patient had a procedure done on 04/08/2015 and this was a right angioplasty of the dorsalis pedis, anterior tibial artery, first superficial femoral artery in its midportion. There was successful intervention with recanalization and in-line flow to the right foot. 04/22/2015 -- the patient was looking rather pale today and the daughter confirms that her hemoglobin was down to 6.9. she is being monitored by her PCP. Her Lasix dose was increased and her edema has gone down  significantly. 04/29/2015 -- she had vascular studies done and the ABI on the right is 1.3 and the toe pressures were within normal limits. Her hemoglobin is still around 7 and she refuses to take any blood transfusions for religious reasons. Her potassium was normal. Objective Constitutional Pulse regular. Respirations normal and unlabored. Afebrile. Vitals Time Taken: 11:50 AM, Height: 62 in, Weight: 155 lbs, BMI: 28.3, Temperature: 97.7 F, Pulse: 85 bpm, Respiratory Rate: 18 breaths/min, Blood Pressure: 120/85 mmHg. Eyes Nonicteric. Reactive to light. Ears, Nose, Mouth, and Throat Lips, teeth, and gums WNL.Marland Kitchen Moist mucosa without lesions . Neck supple and nontender. No palpable supraclavicular or cervical adenopathy. Normal sized without goiter. Respiratory WNL. No retractions.. Cardiovascular Pedal Pulses WNL. No clubbing, cyanosis or edema. MACHAMER, Tritia ([id number]) Chest Breasts symmetical and no nipple discharge.. Breast tissue WNL, no masses, lumps, or tenderness.. Lymphatic No adneopathy. No adenopathy. No adenopathy. Musculoskeletal Adexa without tenderness or enlargement.. Digits and nails w/o clubbing, cyanosis, infection, petechiae, ischemia, or inflammatory conditions.Marland Kitchen Psychiatric Judgement and insight Intact.. No evidence of depression, anxiety, or agitation.. General Notes: The left BKA stump looks excellent and there is minimal depth to the medial wound which will be packed with quarter-inch iodoform gauze. The right calcaneum is in need of sharp debridement with a curette and we will continue with silver alginate on both the wounds on the right foot. Integumentary (Hair, Skin) No suspicious lesions. No crepitus or fluctuance. No peri-wound warmth or erythema. No masses.. Wound #1 status is Open. Original cause of wound was Gradually Appeared. The wound is located on the Right Calcaneous. The wound measures 1.5cm length x 1.5cm width x 0.2cm depth; 1.767cm^2  area and 0.353cm^3 volume. The wound is limited to skin breakdown. There is no tunneling or undermining noted. There is a medium amount of serosanguineous drainage noted. The wound margin is distinct with the outline attached to the wound base. There is large (67-100%) pink, pale granulation within the wound bed. There is a small (1-33%) amount of necrotic tissue within the wound bed including Adherent Slough. The periwound skin appearance exhibited: Localized Edema, Maceration, Moist. The periwound skin appearance did not exhibit: Callus, Crepitus, Excoriation, Fluctuance, Friable, Induration, Rash, Scarring, Dry/Scaly, Atrophie Blanche, Cyanosis, Ecchymosis, Hemosiderin Staining, Mottled, Pallor, Rubor, Erythema. Periwound temperature was noted as  No Abnormality. The periwound has tenderness on palpation. Wound #2 status is Open. Original cause of wound was Gradually Appeared. The wound is located on the Right Toe Third. The wound measures 0.5cm length x 0.4cm width x 0.1cm depth; 0.157cm^2 area and 0.016cm^3 volume. The wound is limited to skin breakdown. There is no tunneling or undermining noted. There is a medium amount of serosanguineous drainage noted. The wound margin is distinct with the outline attached to the wound base. There is large (67-100%) pink, pale granulation within the wound bed. There is no necrotic tissue within the wound bed. The periwound skin appearance did not exhibit: Callus, Crepitus, Excoriation, Fluctuance, Friable, Induration, Localized Edema, Rash, Scarring, Dry/Scaly, Maceration, Moist, Atrophie Blanche, Cyanosis, Ecchymosis, Hemosiderin Staining, Mottled, Pallor, Rubor, Erythema. Periwound temperature was noted as No Abnormality. Wound #3 status is Open. Original cause of wound was Surgical Injury. The wound is located on the Left Amputation Site - Below Knee. The wound measures 0.2cm length x 1cm width x 0.8cm depth; 0.157cm^2 area and 0.126cm^3 volume. The  wound is limited to skin breakdown. There is no tunneling or undermining noted. There is a medium amount of serosanguineous drainage noted. The wound margin is distinct with the outline attached to the wound base. There is large (67-100%) pale granulation within the wound bed. There is a small (1-33%) amount of necrotic tissue within the wound bed. The periwound skin appearance exhibited: Localized Edema, Maceration, Moist. The periwound skin appearance did not exhibit: Callus, Crepitus, Excoriation, Fluctuance, Friable, Induration, Rash, Scarring, Dry/Scaly, Tiki, Tucciarone, Laquanda ([id number]) Cyanosis, Ecchymosis, Hemosiderin Staining, Mottled, Pallor, Rubor, Erythema. Periwound temperature was noted as No Abnormality. The periwound has tenderness on palpation. 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 The left BKA stump will be packed with quarter-inch iodoform gauze and the right leg wounds will get silver alginate. She continues to improve with her hemoglobin coming up to 8 g and we will see her back next week. 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 There was a Skin/Subcutaneous Tissue Debridement (40981-19147) debridement with total area of 2.25 sq cm performed by Tristan Schroeder., MD. with the following instrument(s): Curette to remove Viable and Non-Viable tissue/material  including Fibrin/Slough, Callus, and Subcutaneous after achieving pain control using Lidocaine 4% Topical Solution. A time out was conducted prior to the start of the procedure. A Minimum amount of bleeding was controlled with Pressure. The procedure was tolerated well with a pain level of 0 throughout and a pain level of 0 following the procedure. Post Debridement Measurements: 1.5cm length x 1.5cm width x 0.2cm depth; 0.353cm^3 volume. Plan Wound Cleansing: Wound #1 Right Calcaneous: Cleanse wound with mild soap and water Balinski, Joletta ([id number]) May Shower, gently pat wound dry prior to applying new dressing. May shower with protection. Wound #2 Right Toe Third: Cleanse wound with mild soap and water May Shower, gently pat wound dry prior to applying new dressing. May shower with protection.  Wound #3 Left Amputation Site - Below Knee: Cleanse wound with mild soap and water May Shower, gently pat wound dry prior to applying new dressing. May shower with protection. Primary Wound Dressing: Wound #1 Right Calcaneous: Aquacel Ag Wound #2 Right Toe Third: Aquacel Ag Wound #3 Left Amputation Site - Below Knee: Iodoform packing Gauze Secondary Dressing: Wound #1 Right Calcaneous: Gauze and Kerlix/Conform XtraSorb Wound #2 Right Toe Third: Gauze and Kerlix/Conform Wound #3 Left Amputation Site - Below Knee: Gauze and Kerlix/Conform XtraSorb Dressing Change Frequency: Wound #1 Right Calcaneous: Change dressing every day. Wound #2 Right Toe Third: Change dressing every day. Wound #3 Left Amputation Site - Below Knee: Change dressing every day. Follow-up Appointments: Wound #1 Right Calcaneous: Return Appointment in 1 week. Wound #2 Right Toe Third: Return Appointment in 1 week. 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 Home Health: Wound #1 Right Calcaneous: Continue Home Health Visits - [facility] Nurse may visit PRN to  address patient s wound care needs. FACE TO FACE ENCOUNTER: MEDICARE and MEDICAID PATIENTS: I certify that this patient is under my care and that I had a face-to-face encounter that meets the physician face-to-face encounter requirements with this patient on this date. The encounter with the patient was in whole or in part for the following MEDICAL CONDITION: (primary reason for Home Healthcare) MEDICAL NECESSITY: I certify, that based on my findings, NURSING services are a medically necessary home health service. HOME BOUND STATUS: I certify that my clinical findings support that this patient is homebound (i.e., Due to illness or injury, pt requires aid of supportive devices such as crutches, cane, wheelchairs, walkers, the use Meriweather, Symphoni ([id number]) of special transportation or the assistance of another person to leave their place of residence. There is a normal inability to leave the home and doing so requires considerable and taxing effort. Other absences are for medical reasons / religious services and are infrequent or of short duration when for other reasons). If current dressing causes regression in wound condition, may D/C ordered dressing product/s and apply Normal Saline Moist Dressing daily until next Wound Healing Center / Other MD appointment. Notify Wound Healing Center of regression in wound condition at [phone]. Please direct any NON-WOUND related issues/requests for orders to patient's Primary Care Physician Wound #2 Right Toe Third: Continue Home Health Visits - [facility] Nurse may visit PRN to address patient s wound care needs. FACE TO FACE ENCOUNTER: MEDICARE and MEDICAID PATIENTS: I certify that this patient is under my care and that I had a face-to-face encounter that meets the physician face-to-face encounter requirements with this patient on this date. The encounter with the patient was in whole or in part for the following MEDICAL CONDITION:  (primary reason for Home Healthcare) MEDICAL NECESSITY: I certify, that based on my findings, NURSING services are a medically necessary home health service. HOME BOUND STATUS: I certify that my clinical findings support that this patient is homebound (i.e., Due to illness or injury, pt requires aid of supportive devices such as crutches, cane, wheelchairs, walkers, the use of special transportation or the assistance of another person to leave their place of residence. There is a normal inability to leave the home and doing so requires considerable and taxing effort. Other absences are for medical reasons / religious services and are infrequent or of short duration when for other reasons). If current dressing causes regression in wound condition, may D/C ordered dressing product/s and  apply Normal Saline Moist Dressing daily until next Wound Healing Center / Other MD appointment. Notify Wound Healing Center of regression in wound condition at [phone]. Please direct any NON-WOUND related issues/requests for orders to patient's Primary Care Physician Wound #3 Left Amputation Site - Below Knee: Continue Home Health Visits - [facility] Nurse may visit PRN to address patient s wound care needs. FACE TO FACE ENCOUNTER: MEDICARE and MEDICAID PATIENTS: I certify that this patient is under my care and that I had a face-to-face encounter that meets the physician face-to-face encounter requirements with this patient on this date. The encounter with the patient was in whole or in part for the following MEDICAL CONDITION: (primary reason for Home Healthcare) MEDICAL NECESSITY: I certify, that based on my findings, NURSING services are a medically necessary home health service. HOME BOUND STATUS: I certify that my clinical findings support that this patient is homebound (i.e., Due to illness or injury, pt requires aid of supportive devices such as crutches, cane, wheelchairs, walkers, the  use of special transportation or the assistance of another person to leave their place of residence. There is a normal inability to leave the home and doing so requires considerable and taxing effort. Other absences are for medical reasons / religious services and are infrequent or of short duration when for other reasons). If current dressing causes regression in wound condition, may D/C ordered dressing product/s and apply Normal Saline Moist Dressing daily until next Wound Healing Center / Other MD appointment. Notify Wound Healing Center of regression in wound condition at [phone]. Please direct any NON-WOUND related issues/requests for orders to patient's Primary Care Physician Overall there is excellent progress in healing of the wounds in both limbs. The left BKA stump will be packed with quarter-inch iodoform gauze and the right leg wounds will get silver alginate. She continues to improve with her hemoglobin coming up to 8 g and we will see her back next week. LARKEN, URIAS ([id number]) Electronic Signature(s) Signed: 05/13/2015 12:24:29 PM By: Evlyn Kanner MD, FACS Entered By: Evlyn Kanner on 05/13/2015 12:24:29 [name] ([id number]) -------------------------------------------------------------------------------- SuperBill Details Patient Name: [name] Date of Service: 05/13/2015 Medical Record Number: [id number] Patient Account Number: [id number] Date of Birth/Sex: 02-14-1921 (81 y.o. Female) Treating RN: Huel Coventry Primary Care Physician: Dorothey Baseman Other Clinician: Referring Physician: Dorothey Baseman Treating Physician/Extender: Rudene Re in Treatment: 13 Diagnosis Coding ICD-10 Codes Code Description E11.621 Type 2 diabetes mellitus with foot ulcer I70.235 Atherosclerosis of native arteries of right leg with ulceration of other part of foot Z89.512 Acquired absence of left leg below knee L97.412 Non-pressure chronic ulcer of right  heel and midfoot with fat layer exposed L97.812 Non-pressure chronic ulcer of other part of right lower leg with fat layer exposed Disruption of external operation (surgical) wound, not elsewhere classified, initial T81.31XA encounter Facility Procedures CPT4: Description Modifier Quantity Code [id number] 11042 - DEB SUBQ TISSUE 20 SQ CM/< 1 ICD-10 Description Diagnosis L97.812 Non-pressure chronic ulcer of other part of right lower leg with fat layer exposed L97.412 Non-pressure chronic ulcer of right  heel and midfoot with fat layer exposed E11.621 Type 2 diabetes mellitus with foot ulcer Physician Procedures CPT4: Description Modifier Quantity Code [id number] 11042 - WC PHYS SUBQ TISS 20 SQ CM 1 ICD-10 Description Diagnosis L97.812 Non-pressure chronic ulcer of other part of right lower leg with fat layer exposed L97.412 Non-pressure chronic ulcer of right heel  and midfoot with fat layer exposed E11.621 Type 2 diabetes mellitus with  foot ulcer Electronic Signature(s) AUSTELL, Mane ([id number]) Signed: 05/13/2015 12:24:45 PM By: Evlyn Kanner MD, FACS Entered By: Evlyn Kanner on 05/13/2015 12:24:44

## 2015-05-20 ENCOUNTER — Encounter: Payer: Medicare Other | Admitting: Surgery

## 2015-05-20 DIAGNOSIS — L97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: Secondary | ICD-10-CM | POA: Diagnosis not present

## 2015-05-20 NOTE — Progress Notes (Addendum)
SINDHU, NGUYEN ([id number]) Visit Report for 05/20/2015 Arrival Information Details Patient Name: [name], [name] Date of Service: 05/20/2015 11:45 AM Medical Record Number: [id number] Patient Account Number: [id number] Date of Birth/Sex: 1921/01/13 (81 y.o. Female) Treating RN: Afful, RN, BSN, [place] Sink Primary Care Physician: Dorothey Baseman Other Clinician: Referring Physician: Terance Hart, DAVID Treating Physician/Extender: Rudene Re in Treatment: 14 Visit Information History Since Last Visit Added or deleted any medications: No Patient Arrived: Wheel Chair Any new allergies or adverse reactions: No Arrival Time: 11:55 Had a fall or experienced change in No activities of daily living that may affect Accompanied By: Jens Som risk of falls: Transfer Assistance: Manual Signs or symptoms of abuse/neglect since last No Patient Identification Verified: Yes visito Secondary Verification Process Yes Hospitalized since last visit: No Completed: Has Dressing in Place as Prescribed: Yes Patient Has Alerts: Yes Pain Present Now: No Patient Alerts: DMII Electronic Signature(s) Signed: 05/20/2015 11:55:40 AM By: Elpidio Eric BSN, RN Entered By: Elpidio Eric on 05/20/2015 11:55:40 [name] ([id number]) -------------------------------------------------------------------------------- Encounter Discharge Information Details Patient Name: [name] Date of Service: 05/20/2015 11:45 AM Medical Record Number: [id number] Patient Account Number: [id number] Date of Birth/Sex: 1921/01/03 (81 y.o. Female) Treating RN: Afful, RN, BSN, [place] Sink Primary Care Physician: Dorothey Baseman Other Clinician: Referring Physician: Terance Hart DAVID Treating Physician/Extender: Rudene Re in Treatment: 14 Encounter Discharge Information Items Discharge Pain Level: 0 Discharge Condition: Stable Ambulatory Status: Wheelchair Discharge Destination: Home Transportation: Private  Auto Accompanied By: dtr Schedule Follow-up Appointment: No Medication Reconciliation completed and provided to Patient/Care No Kenden Brandt: Provided on Clinical Summary of Care: 05/20/2015 Form Type Recipient Paper Patient HS Electronic Signature(s) Signed: 05/20/2015 12:27:38 PM By: Elpidio Eric BSN, RN Previous Signature: 05/20/2015 12:23:46 PM Version By: Gwenlyn Perking Entered By: Elpidio Eric on 05/20/2015 12:27:38 [name] ([id number]) -------------------------------------------------------------------------------- Lower Extremity Assessment Details Patient Name: [name] Date of Service: 05/20/2015 11:45 AM Medical Record Number: [id number] Patient Account Number: [id number] Date of Birth/Sex: August 13, 1921 (81 y.o. Female) Treating RN: Afful, RN, BSN, [place] Sink Primary Care Physician: Dorothey Baseman Other Clinician: Referring Physician: Terance Hart, DAVID Treating Physician/Extender: Rudene Re in Treatment: 14 Vascular Assessment Pulses: Posterior Tibial Dorsalis Pedis Palpable: [Right:No] Doppler: [Right:Monophasic] Extremity colors, hair growth, and conditions: Extremity Color: [Right:Normal] Hair Growth on Extremity: [Right:No] Temperature of Extremity: [Right:Warm] Capillary Refill: [Right:< 3 seconds] Toe Nail Assessment Left: Right: Thick: Yes Discolored: Yes Deformed: No Improper Length and Hygiene: No Electronic Signature(s) Signed: 05/20/2015 11:58:22 AM By: Elpidio Eric BSN, RN Entered By: Elpidio Eric on 05/20/2015 11:58:22 [name] ([id number]) -------------------------------------------------------------------------------- Multi Wound Chart Details Patient Name: [name] Date of Service: 05/20/2015 11:45 AM Medical Record Number: [id number] Patient Account Number: [id number] Date of Birth/Sex: 28-Oct-1920 (81 y.o. Female) Treating RN: Clover Mealy, RN, BSN, [place] Sink Primary Care Physician: Dorothey Baseman Other Clinician: Referring  Physician: Terance Hart, DAVID Treating Physician/Extender: Rudene Re in Treatment: 14 Vital Signs Height(in): 62 Pulse(bpm): 82 Weight(lbs): 155 Blood Pressure 138/72 (mmHg): Body Mass Index(BMI): 28 Temperature(F): 97.6 Respiratory Rate 16 (breaths/min): Photos: [1:No Photos] [2:No Photos] [3:No Photos] Wound Location: [1:Right Calcaneous] [2:Right Toe Third] [3:Left Amputation Site - Below Knee] Wounding Event: [1:Gradually Appeared] [2:Gradually Appeared] [3:Surgical Injury] 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] Date Acquired: [1:11/04/2014] [2:11/04/2014] [3:01/03/2015] Weeks of Treatment: [1:14] [2:14] [3:13] Wound Status: [1:Open] [2:Open] [3:Open] Measurements L x W x D 1.7x1.8x0.2 [2:2x0.5x0.2] [3:0.2x1.8x0.5] (cm) Area (cm) : [1:2.403] [2:0.785] [3:0.283] Volume (cm) : [1:0.481] [2:0.157] [3:0.141] % Reduction in Area: [1:56.30%] [2:16.70%] [3:97.30%] % Reduction in Volume: 12.50% [2:-67.00%] [  3:96.60%] Classification: [1:Full Thickness Without Exposed Support Structures] [2:Full Thickness Without Exposed Support Structures] [3:Full Thickness Without Exposed Support Structures] Periwound Skin Texture: No Abnormalities Noted No Abnormalities Noted No Abnormalities Noted Periwound Skin [1:No Abnormalities Noted No Abnormalities Noted No Abnormalities Noted] Moisture: Periwound Skin Color: No Abnormalities Noted No Abnormalities Noted No Abnormalities Noted Tenderness on [1:No] [2:No] [3:No] Treatment Notes Electronic Signature(s) Signed: 05/20/2015 12:11:26 PM By: Elpidio Eric BSN, RN [name], [name] ([id number]) Entered By: Elpidio Eric on 05/20/2015 12:11:25 [name] ([id number]) -------------------------------------------------------------------------------- Multi-Disciplinary Care Plan Details Patient Name: [name] Date of Service: 05/20/2015 11:45 AM Medical Record Number:  [id number] Patient Account Number: [id number] Date of Birth/Sex: 26-Jul-1921 (81 y.o. Female) Treating RN: Afful, RN, BSN, [place] Sink Primary Care Physician: Terance Hart, DAVID Other Clinician: Referring Physician: Terance Hart, DAVID Treating Physician/Extender: Rudene Re in Treatment: 14 Active Inactive Abuse / Safety / Falls / Self Care Management Nursing Diagnoses: Impaired physical mobility Potential for falls Goals: Patient will remain injury free Date Initiated: 02/11/2015 Goal Status: Active Patient/caregiver will verbalize understanding of skin care regimen Date Initiated: 02/11/2015 Goal Status: Active Patient/caregiver will verbalize/demonstrate measures taken to prevent injury and/or falls Date Initiated: 02/11/2015 Goal Status: Active Patient/caregiver will verbalize/demonstrate understanding of what to do in case of emergency Date Initiated: 02/11/2015 Goal Status: Active Interventions: Assess fall risk on admission and as needed Provide education on fall prevention Provide education on safe transfers Treatment Activities: Patient referred to home care : 05/20/2015 Notes: Nutrition Nursing Diagnoses: Imbalanced nutrition Potential for alteratiion in Nutrition/Potential for imbalanced nutrition [name], [name] ([id number]) Goals: Patient/caregiver verbalizes understanding of need to maintain therapeutic glucose control per primary care physician Date Initiated: 02/11/2015 Goal Status: Active Patient/caregiver will maintain therapeutic glucose control Date Initiated: 02/11/2015 Goal Status: Active Interventions: Assess HgA1c results as ordered upon admission and as needed Provide education on elevated blood sugars and impact on wound healing Provide education on nutrition Treatment Activities: Education provided on Nutrition : 04/22/2015 Obtain HgA1c : 05/20/2015 Notes: Wound/Skin Impairment Nursing Diagnoses: Impaired tissue integrity Knowledge deficit related  to ulceration/compromised skin integrity Goals: Patient/caregiver will verbalize understanding of skin care regimen Date Initiated: 02/11/2015 Goal Status: Active Ulcer/skin breakdown will heal within 14 weeks Date Initiated: 02/11/2015 Goal Status: Active Interventions: Assess patient/caregiver ability to obtain necessary supplies Assess patient/caregiver ability to perform ulcer/skin care regimen upon admission and as needed Assess ulceration(s) every visit Provide education on ulcer and skin care Treatment Activities: Skin care regimen initiated : 05/20/2015 Topical wound management initiated : 05/20/2015 Notes: [name], [name] ([id number]) Electronic Signature(s) Signed: 05/20/2015 12:11:13 PM By: Elpidio Eric BSN, RN Entered By: Elpidio Eric on 05/20/2015 12:11:12 [name] ([id number]) -------------------------------------------------------------------------------- Pain Assessment Details Patient Name: [name] Date of Service: 05/20/2015 11:45 AM Medical Record Number: [id number] Patient Account Number: [id number] Date of Birth/Sex: Feb 07, 1921 (81 y.o. Female) Treating RN: Clover Mealy, RN, BSN, [place] Sink Primary Care Physician: Dorothey Baseman Other Clinician: Referring Physician: Dorothey Baseman Treating Physician/Extender: Rudene Re in Treatment: 14 Active Problems Location of Pain Severity and Description of Pain Patient Has Paino No Site Locations Pain Management and Medication Current Pain Management: Electronic Signature(s) Signed: 05/20/2015 11:55:48 AM By: Elpidio Eric BSN, RN Entered By: Elpidio Eric on 05/20/2015 11:55:48 [name] ([id number]) -------------------------------------------------------------------------------- Patient/Caregiver Education Details Patient Name: [name] Date of Service: 05/20/2015 11:45 AM Medical Record Number: [id number] Patient Account Number: [id number] Date of Birth/Gender: [birth year] (81 y.o. Female) Treating  RN: Clover Mealy, RN, BSN,  Sink Primary Care Physician: Dorothey Baseman Other Clinician: Referring Physician: Dorothey Baseman Treating Physician/Extender:  Britto, Cecilie Kicks in Treatment: 14 Education Assessment Education Provided To: Patient and Caregiver Education Topics Provided Elevated Blood Sugar/ Impact on Healing: Methods: Explain/Verbal Responses: State content correctly Nutrition: Methods: Explain/Verbal Responses: State content correctly Safety: Methods: Explain/Verbal Responses: State content correctly Wound/Skin Impairment: Methods: Explain/Verbal Responses: State content correctly Electronic Signature(s) Signed: 05/20/2015 12:27:58 PM By: Elpidio Eric BSN, RN Entered By: Elpidio Eric on 05/20/2015 12:27:58 [name] ([id number]) -------------------------------------------------------------------------------- Wound Assessment Details Patient Name: [name] Date of Service: 05/20/2015 11:45 AM Medical Record Number: [id number] Patient Account Number: [id number] Date of Birth/Sex: 20-Mar-1921 (81 y.o. Female) Treating RN: Afful, RN, BSN, [place] Sink Primary Care Physician: Terance Hart, DAVID Other Clinician: Referring Physician: Terance Hart, DAVID Treating Physician/Extender: Rudene Re in Treatment: 14 Wound Status Wound Number: 1 Primary Etiology: Arterial Insufficiency Ulcer Wound Location: Right Calcaneous Wound Status: Open Wounding Event: Gradually Appeared Date Acquired: 11/04/2014 Weeks Of Treatment: 14 Clustered Wound: No Photos Photo Uploaded By: Elpidio Eric on 05/20/2015 16:28:54 Wound Measurements Length: (cm) 1.7 Width: (cm) 1.8 Depth: (cm) 0.2 Area: (cm) 2.403 Volume: (cm) 0.481 % Reduction in Area: 56.3% % Reduction in Volume: 12.5% Wound Description Full Thickness Without Exposed Classification: Support Structures Periwound Skin Texture Texture Color No Abnormalities Noted: No No Abnormalities Noted: No Moisture No  Abnormalities Noted: No Treatment Notes Wound #1 (Right Calcaneous) [name], [name] ([id number]) 1. Cleansed with: Clean wound with Normal Saline 3. Peri-wound Care: Skin Prep 4. Dressing Applied: Aquacel Ag 5. Secondary Dressing Applied Bordered Foam Dressing Notes right second toe wrapped with conform. Silver alginate applie in clinic. Caregiver to applied iodoform when they get home. Electronic Signature(s) Signed: 05/20/2015 4:40:30 PM By: Elpidio Eric BSN, RN Entered By: Elpidio Eric on 05/20/2015 12:04:33 [name] ([id number]) -------------------------------------------------------------------------------- Wound Assessment Details Patient Name: [name] Date of Service: 05/20/2015 11:45 AM Medical Record Number: [id number] Patient Account Number: [id number] Date of Birth/Sex: [birth year] (81 y.o. Female) Treating RN: Afful, RN, BSN, [place] Sink Primary Care Physician: Terance Hart, DAVID Other Clinician: Referring Physician: Terance Hart, DAVID Treating Physician/Extender: Rudene Re in Treatment: 14 Wound Status Wound Number: 2 Primary Arterial Insufficiency Ulcer Etiology: Wound Location: Right Toe Third Wound Open Wounding Event: Gradually Appeared Status: Date Acquired: 11/04/2014 Comorbid Arrhythmia, Deep Vein Thrombosis, Weeks Of Treatment: 14 History: Peripheral Arterial Disease, Type II Clustered Wound: No Diabetes Photos Photo Uploaded By: Elpidio Eric on 05/20/2015 16:29:27 Wound Measurements Length: (cm) 0.5 Width: (cm) 0.5 Depth: (cm) 0.2 Area: (cm) 0.196 Volume: (cm) 0.039 % Reduction in Area: 79.2% % Reduction in Volume: 58.5% Epithelialization: Small (1-33%) Tunneling: No Undermining: No Wound Description Full Thickness Without Exposed Classification: Support Structures Diabetic Severity Grade 1 (Wagner): Wound Margin: Distinct, outline attached Exudate Amount: Medium Exudate Type: Serosanguineous Exudate Color: red, brown Foul  Odor After Cleansing: No Wound Bed Granulation Amount: Large (67-100%) Exposed Structure [name], [name] ([id number]) Granulation Quality: Pink, Pale Fascia Exposed: No Necrotic Amount: None Present (0%) Fat Layer Exposed: No Tendon Exposed: No Muscle Exposed: No Joint Exposed: No Bone Exposed: No Limited to Skin Breakdown Periwound Skin Texture Texture Color No Abnormalities Noted: No No Abnormalities Noted: No Callus: No Atrophie Blanche: No Crepitus: No Cyanosis: No Excoriation: No Ecchymosis: No Fluctuance: No Erythema: No Friable: No Hemosiderin Staining: No Induration: No Mottled: No Localized Edema: No Pallor: No Rash: No Rubor: No Scarring: No Temperature / Pain Moisture Temperature: No Abnormality No Abnormalities Noted: No Dry / Scaly: No Maceration: No Moist: Yes Wound Preparation Ulcer Cleansing: Rinsed/Irrigated with Saline Topical Anesthetic Applied: None Treatment Notes Wound #2 (Right  Toe Third) 1. Cleansed with: Clean wound with Normal Saline 3. Peri-wound Care: Skin Prep 4. Dressing Applied: Aquacel Ag 5. Secondary Dressing Applied Bordered Foam Dressing Notes right second toe wrapped with conform. Silver alginate applie in clinic. Caregiver to applied iodoform when they get home. Electronic Signature(s) Signed: 05/20/2015 12:14:31 PM By: Elpidio Eric BSN, RN Previous Signature: 05/20/2015 12:13:52 PM Version By: Elpidio Eric BSN, RN [name], [name] ([id number]) Entered By: Elpidio Eric on 05/20/2015 12:14:31 [name] ([id number]) -------------------------------------------------------------------------------- Wound Assessment Details Patient Name: [name] Date of Service: 05/20/2015 11:45 AM Medical Record Number: [id number] Patient Account Number: [id number] Date of Birth/Sex: 31-Jul-1921 (81 y.o. Female) Treating RN: Afful, RN, BSN, [place] Sink Primary Care Physician: Terance Hart, DAVID Other Clinician: Referring Physician:  Terance Hart, DAVID Treating Physician/Extender: Rudene Re in Treatment: 14 Wound Status Wound Number: 3 Primary Etiology: Dehisced Wound Wound Location: Left Amputation Site - Below Wound Status: Open Knee Wounding Event: Surgical Injury Date Acquired: 01/03/2015 Weeks Of Treatment: 13 Clustered Wound: No Photos Photo Uploaded By: Elpidio Eric on 05/20/2015 16:29:28 Wound Measurements Length: (cm) 0.2 Width: (cm) 1.8 Depth: (cm) 0.5 Area: (cm) 0.283 Volume: (cm) 0.141 % Reduction in Area: 97.3% % Reduction in Volume: 96.6% Wound Description Full Thickness Without Exposed Classification: Support Structures Periwound Skin Texture Texture Color No Abnormalities Noted: No No Abnormalities Noted: No Moisture No Abnormalities Noted: No Treatment Notes [name], [name] ([id number]) Wound #3 (Left Amputation Site - Below Knee) 1. Cleansed with: Clean wound with Normal Saline 3. Peri-wound Care: Skin Prep 4. Dressing Applied: Aquacel Ag 5. Secondary Dressing Applied Bordered Foam Dressing Notes right second toe wrapped with conform. Silver alginate applie in clinic. Caregiver to applied iodoform when they get home. Electronic Signature(s) Signed: 05/20/2015 4:40:30 PM By: Elpidio Eric BSN, RN Entered By: Elpidio Eric on 05/20/2015 12:04:34 [name] ([id number]) -------------------------------------------------------------------------------- Vitals Details Patient Name: [name] Date of Service: 05/20/2015 11:45 AM Medical Record Number: [id number] Patient Account Number: [id number] Date of Birth/Sex: [birth year] (81 y.o. Female) Treating RN: Afful, RN, BSN, [place] Sink Primary Care Physician: Terance Hart, DAVID Other Clinician: Referring Physician: Terance Hart, DAVID Treating Physician/Extender: Rudene Re in Treatment: 14 Vital Signs Time Taken: 12:08 Temperature (F): 97.6 Height (in): 62 Pulse (bpm): 82 Weight (lbs): 155 Respiratory Rate  (breaths/min): 16 Body Mass Index (BMI): 28.3 Blood Pressure (mmHg): 138/72 Reference Range: 80 - 120 mg / dl Electronic Signature(s) Signed: 05/20/2015 12:08:58 PM By: Elpidio Eric BSN, RN Entered By: Elpidio Eric on 05/20/2015 16:60:63

## 2015-05-20 NOTE — Progress Notes (Signed)
AZAYLEA, MAVES ([id number]) Visit Report for 05/20/2015 Chief Complaint Document Details Patient Name: [name], [name] Date of Service: 05/20/2015 11:45 AM Medical Record Number: [id number] Patient Account Number: [id number] Date of Birth/Sex: 1921/03/10 (81 y.o. Female) Treating RN: Clover Mealy, RN, BSN, [place] Sink Primary Care Physician: Dorothey Baseman Other Clinician: Referring Physician: Dorothey Baseman Treating Physician/Extender: Rudene Re in Treatment: 14 Information Obtained from: Patient Chief Complaint Patient presents to the wound care center for a consult due non healing wound. 81 year old patient with comes with a history of a ulcerated area to the right third toe and the right heel which she's had for about 2 months. Electronic Signature(s) Signed: 05/20/2015 1:02:26 PM By: Evlyn Kanner MD, FACS Entered By: Evlyn Kanner on 05/20/2015 13:02:26 [name] ([id number]) -------------------------------------------------------------------------------- HPI Details Patient Name: [name] Date of Service: 05/20/2015 11:45 AM Medical Record Number: [id number] Patient Account Number: [id number] Date of Birth/Sex: 10-17-1920 (81 y.o. Female) Treating RN: Afful, RN, BSN, [place] Sink Primary Care Physician: Dorothey Baseman Other Clinician: Referring Physician: Terance Hart, DAVID Treating Physician/Extender: Rudene Re in Treatment: 14 History of Present Illness Location: right medial heel and right third toe Quality: Patient reports experiencing a dull pain to affected area(s). Severity: Patient states wound are getting worse. Duration: Patient has had the wound for > 3 months prior to seeking treatment at the wound center Timing: Pain in wound is Intermittent (comes and goes Context: The wound appeared gradually over time Modifying Factors: Consults to this date include:vascular surgeon and several recent surgeries. Associated Signs and Symptoms: Patient reports having  foul odor and drainage from the left below-knee amputation site. HPI Description: A pleasant 81 year old patient who is known to have diabetes mellitus for several years has recently gone through a series of operations with the vascular surgeon Dr. Gilda Crease. In January and February she had several surgeries on the left lower extremity with an attempt to have limb salvage for gangrenous changes of her forefoot. 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 On 01/07/2015 she also had a right lower extremity distal runoff with a angioplasty of the right anterior tibial artery to maximize her blood flow to the foot. She recently had her staples removed at Dr. Marijean Heath office on 01/31/2015 and at that time a right lower extremity duplex was done which showed patent vessels and a patent stent with distal occluded posterior tibial artery. Though the duplex was noncritical the recommendations from the PA at the vascular surgery office was that of a angiogram to be done but the patient said she would rather try some bone care before. The patient has received doxycycline and Cipro in the recent past and she takes oral medications for her diabetes. Other than that I reviewed her list of all her medications. No recent hemoglobin A1c has been done and no recent x-rays of the right foot have been done. 02/18/2015 -- x-ray of the right foot was done on 02/11/2015 and it shows no evidence of acute osteomyelitis of the third toe or of the calcaneus. She had gone to the vascular surgery office and they had noted that there is dehiscence of the left part of the amputation site of the below-knee wound and they have asked [place] to kindly take over the care of this. There've been doing dressings for the right heel and right third toe. 02/25/2015 -- we have received notes from the vascular group who saw her last on 02/13/2015 and she was seen by  the PA Ms. Cleda Daub. She had recommended that the patient continue to follow with [place] for wound care including management of the left BKA stump, which has had dehiscence of the lateral part. They will consider repeating a arterial duplex or angiogram if the right lower extremity does not heal within a reasonable period of time. 03/18/2015 -- he saw the vascular surgeon Dr. Gilda Crease and he has set her up for an angioplasty sometime in the middle of July. she is doing well otherwise. ALIZZA, SACRA ([id number]) 04/10/2015 -- the patient had a procedure done on 04/08/2015 and this was a right angioplasty of the dorsalis pedis, anterior tibial artery, first superficial femoral artery in its midportion. There was successful intervention with recanalization and in-line flow to the right foot. 04/22/2015 -- the patient was looking rather pale today and the daughter confirms that her hemoglobin was down to 6.9. she is being monitored by her PCP. Her Lasix dose was increased and her edema has gone down significantly. 04/29/2015 -- she had vascular studies done and the ABI on the right is 1.3 and the toe pressures were within normal limits. Her hemoglobin is still around 7 and she refuses to take any blood transfusions for religious reasons. Her potassium was normal. Electronic Signature(s) Signed: 05/20/2015 1:02:31 PM By: Evlyn Kanner MD, FACS Entered By: Evlyn Kanner on 05/20/2015 13:02:31 [name] ([id number]) -------------------------------------------------------------------------------- Physical Exam Details Patient Name: [name] Date of Service: 05/20/2015 11:45 AM Medical Record Number: [id number] Patient Account Number: [id number] Date of Birth/Sex: 09-28-1920 (81 y.o. Female) Treating RN: Clover Mealy, RN, BSN, [place] Sink Primary Care Physician: Dorothey Baseman Other Clinician: Referring Physician: Terance Hart, DAVID Treating Physician/Extender: Rudene Re in Treatment:  14 Constitutional . Pulse regular. Respirations normal and unlabored. Afebrile. . Eyes Nonicteric. Reactive to light. Ears, Nose, Mouth, and Throat Lips, teeth, and gums WNL.Marland Kitchen Moist mucosa without lesions . Neck supple and nontender. No palpable supraclavicular or cervical adenopathy. Normal sized without goiter. Respiratory WNL. No retractions.. Breath sounds WNL, No rubs, rales, rhonchi, or wheeze.. Cardiovascular Heart rhythm and rate regular, no murmur or gallop.. Pedal Pulses WNL. she has significant edema of the right lower extremity and also of the left BKA stump.Marland Kitchen Lymphatic No adneopathy. No adenopathy. No adenopathy. Musculoskeletal Adexa without tenderness or enlargement.. Digits and nails w/o clubbing, cyanosis, infection, petechiae, ischemia, or inflammatory conditions.. Integumentary (Hair, Skin) No suspicious lesions. No crepitus or fluctuance. No peri-wound warmth or erythema. No masses.Marland Kitchen Psychiatric Judgement and insight Intact.. No evidence of depression, anxiety, or agitation.. Notes TThe left BKA stump looks really good and has almost completely healed. The right third toe has almost healed but the right medial calcaneum still has some slough which was wiped up with a moist saline gauze. Electronic Signature(s) Signed: 05/20/2015 1:03:39 PM By: Evlyn Kanner MD, FACS Entered By: Evlyn Kanner on 05/20/2015 13:03:38 [name] ([id number]) -------------------------------------------------------------------------------- Physician Orders Details Patient Name: [name] Date of Service: 05/20/2015 11:45 AM Medical Record Number: [id number] Patient Account Number: [id number] Date of Birth/Sex: [birth year] (81 y.o. Female) Treating RN: Afful, RN, BSN, [place] Sink Primary Care Physician: Dorothey Baseman Other Clinician: Referring Physician: Terance Hart DAVID Treating Physician/Extender: Rudene Re in Treatment: 32 Verbal / Phone Orders: Yes Clinician:  Afful, RN, BSN, Rita Read Back and Verified: Yes Diagnosis Coding Wound Cleansing Wound #1 Right Calcaneous o Cleanse wound with mild soap and water o May Shower, gently pat wound dry prior to applying new dressing. o May shower with protection. Wound #2 Right Toe Third o Cleanse  wound with mild soap and water o May Shower, gently pat wound dry prior to applying new dressing. o May shower with protection. Wound #3 Left Amputation Site - Below Knee o Cleanse wound with mild soap and water o May Shower, gently pat wound dry prior to applying new dressing. o May shower with protection. Skin Barriers/Peri-Wound Care Wound #1 Right Calcaneous o Skin Prep Wound #2 Right Toe Third o Skin Prep Wound #3 Left Amputation Site - Below Knee o Skin Prep Primary Wound Dressing Wound #1 Right Calcaneous o Aquacel Ag Wound #2 Right Toe Third o Aquacel Ag Wound #3 Left Amputation Site - Below Knee o Iodoform packing Gauze Secondary Dressing Klapper, Mylani ([id number]) Wound #1 Right Calcaneous o Boardered Foam Dressing Wound #2 Right Toe Third o Gauze and Kerlix/Conform Wound #3 Left Amputation Site - Below Knee o Boardered Foam Dressing Dressing Change Frequency Wound #1 Right Calcaneous o Change dressing every other day. Wound #2 Right Toe Third o Change dressing every other day. Wound #3 Left Amputation Site - Below Knee o Change dressing every other day. Follow-up Appointments Wound #1 Right Calcaneous o Return Appointment in 1 week. Wound #2 Right Toe Third o Return Appointment in 1 week. Wound #3 Left Amputation Site - Below Knee o Return Appointment in 1 week. Home Health Wound #1 Right Calcaneous o Continue Home Health Visits - [facility] o Home Health Nurse may visit PRN to address patientos wound care needs. o FACE TO FACE ENCOUNTER: MEDICARE and MEDICAID PATIENTS: I certify that this patient is under my care and  that I had a face-to-face encounter that meets the physician face-to-face encounter requirements with this patient on this date. The encounter with the patient was in whole or in part for the following MEDICAL CONDITION: (primary reason for Home Healthcare) MEDICAL NECESSITY: I certify, that based on my findings, NURSING services are a medically necessary home health service. HOME BOUND STATUS: I certify that my clinical findings support that this patient is homebound (i.e., Due to illness or injury, pt requires aid of supportive devices such as crutches, cane, wheelchairs, walkers, the use of special transportation or the assistance of another person to leave their place of residence. There is a normal inability to leave the home and doing so requires considerable and taxing effort. Other absences are for medical reasons / religious services and are infrequent or of short duration when for other reasons). o If current dressing causes regression in wound condition, may D/C ordered dressing product/s and apply Normal Saline Moist Dressing daily until next Wound Healing Center / Other MD appointment. Notify Wound Healing Center of regression in wound condition at [phone]. LATANGELA, MCCOMAS ([id number]) o Please direct any NON-WOUND related issues/requests for orders to patient's Primary Care Physician Wound #2 Right Toe Third o Continue Home Health Visits - [facility] o Home Health Nurse may visit PRN to address patientos wound care needs. o FACE TO FACE ENCOUNTER: MEDICARE and MEDICAID PATIENTS: I certify that this patient is under my care and that I had a face-to-face encounter that meets the physician face-to-face encounter requirements with this patient on this date. The encounter with the patient was in whole or in part for the following MEDICAL CONDITION: (primary reason for Home Healthcare) MEDICAL NECESSITY: I certify, that based on my findings, NURSING services are a  medically necessary home health service. HOME BOUND STATUS: I certify that my clinical findings support that this patient is homebound (i.e., Due to illness or injury, pt requires aid  of supportive devices such as crutches, cane, wheelchairs, walkers, the use of special transportation or the assistance of another person to leave their place of residence. There is a normal inability to leave the home and doing so requires considerable and taxing effort. Other absences are for medical reasons / religious services and are infrequent or of short duration when for other reasons). o If current dressing causes regression in wound condition, may D/C ordered dressing product/s and apply Normal Saline Moist Dressing daily until next Wound Healing Center / Other MD appointment. Notify Wound Healing Center of regression in wound condition at [phone]. o Please direct any NON-WOUND related issues/requests for orders to patient's Primary Care Physician Wound #3 Left Amputation Site - Below Knee o Continue Home Health Visits - [facility] o Home Health Nurse may visit PRN to address patientos wound care needs. o FACE TO FACE ENCOUNTER: MEDICARE and MEDICAID PATIENTS: I certify that this patient is under my care and that I had a face-to-face encounter that meets the physician face-to-face encounter requirements with this patient on this date. The encounter with the patient was in whole or in part for the following MEDICAL CONDITION: (primary reason for Home Healthcare) MEDICAL NECESSITY: I certify, that based on my findings, NURSING services are a medically necessary home health service. HOME BOUND STATUS: I certify that my clinical findings support that this patient is homebound (i.e., Due to illness or injury, pt requires aid of supportive devices such as crutches, cane, wheelchairs, walkers, the use of special transportation or the assistance of another person to leave their place of residence.  There is a normal inability to leave the home and doing so requires considerable and taxing effort. Other absences are for medical reasons / religious services and are infrequent or of short duration when for other reasons). o If current dressing causes regression in wound condition, may D/C ordered dressing product/s and apply Normal Saline Moist Dressing daily until next Wound Healing Center / Other MD appointment. Notify Wound Healing Center of regression in wound condition at [phone]. o Please direct any NON-WOUND related issues/requests for orders to patient's Primary Care Physician Electronic Signature(s) Signed: 05/20/2015 12:24:10 PM By: Elpidio Eric BSN, RN Signed: 05/20/2015 4:00:25 PM By: Evlyn Kanner MD, FACS [place], Bianney ([id number]) Entered By: Elpidio Eric on 05/20/2015 12:24:10 [name] ([id number]) -------------------------------------------------------------------------------- Problem List Details Patient Name: [name] Date of Service: 05/20/2015 11:45 AM Medical Record Number: [id number] Patient Account Number: [id number] Date of Birth/Sex: May 11, 1921 (81 y.o. 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 with 02/11/2015 Yes fat layer exposed L97.812 Non-pressure chronic ulcer of other part of right lower leg 02/11/2015 Yes with fat layer exposed T81.31XA Disruption of external operation (surgical) wound, not 02/18/2015  Yes elsewhere classified, initial encounter Inactive Problems Resolved Problems Electronic Signature(s) Signed: 05/20/2015 1:02:19 PM By: Evlyn Kanner MD, FACS Entered By: Evlyn Kanner on 05/20/2015 13:02:19 [name] ([id number]) -------------------------------------------------------------------------------- Progress Note Details Patient Name: [name] Date of Service: 05/20/2015 11:45 AM Medical Record Number: [id number] Patient Account Number: [id number] Date of Birth/Sex: July 01, 1921 (81 y.o. Female)  Treating RN: Clover Mealy, RN, BSN, [place] Sink Primary Care Physician: Dorothey Baseman Other Clinician: Referring Physician: Dorothey Baseman Treating Physician/Extender: Rudene Re in Treatment: 14 Subjective Chief Complaint Information obtained from Patient Patient presents to the wound care center for a consult due non healing wound. 81 year old patient with comes with a history of a ulcerated area to the right third toe and the right heel which she's had for about 2 months. History of Present Illness (HPI) The following HPI elements were documented for the patient's wound: Location: right medial heel and right third toe Quality: Patient reports experiencing a dull pain to affected area(s). Severity: Patient states wound are getting worse. Duration: Patient has had the wound for > 3 months prior to seeking treatment at the wound center Timing: Pain in wound is Intermittent (comes and goes Context: The wound appeared gradually over time Modifying Factors: Consults to this date include:vascular surgeon and several recent surgeries. Associated Signs and Symptoms: Patient reports having foul odor and drainage from the left below-knee amputation site. A pleasant 81 year old patient who is known to have diabetes mellitus for several years has recently gone through a series of operations with the vascular surgeon Dr. Gilda Crease. In January and February she had several surgeries  on the left lower extremity with an attempt to have limb salvage for gangrenous changes of her forefoot. 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 On 01/07/2015 she also had a right lower extremity distal runoff with a angioplasty of the right anterior tibial artery to maximize her blood flow to the foot. She recently had her staples removed at Dr. Marijean Heath office on 01/31/2015 and at that time a right lower extremity duplex was done which showed patent vessels and a patent stent with distal occluded posterior tibial artery. Though the duplex was noncritical the recommendations from the PA at the vascular surgery office was that of a angiogram to be done but the patient said she would rather try some bone care before. The patient has received doxycycline and Cipro in the recent past and she takes oral medications for her diabetes. Other than that I reviewed her list of all her medications. No recent hemoglobin A1c has been done and no recent x-rays of the right foot have been done. 02/18/2015 -- x-ray of the right foot was done on 02/11/2015 and it shows no evidence of acute osteomyelitis of the third toe or of the calcaneus. She had gone to the vascular surgery office and they had noted that there is dehiscence of the left part of the amputation site of the below-knee wound and they have asked [place] to kindly take over the care of this. There've been doing dressings for the right heel and right third toe. STELLA, BORTLE ([id number]) 02/25/2015 -- we have received notes from the vascular group who saw her last on 02/13/2015 and she was seen by the PA Ms. Cleda Daub. She had recommended that the patient continue to follow with [place] for wound care including management of the left BKA stump, which has had dehiscence of the lateral part. They will consider repeating a arterial duplex or angiogram if the right lower  extremity does not heal within a reasonable period of time. 03/18/2015 -- he saw the vascular surgeon Dr. Gilda Crease and he has set her up for an angioplasty sometime in the middle of July. she is doing well otherwise. 04/10/2015 -- the patient had a procedure done on 04/08/2015  and this was a right angioplasty of the dorsalis pedis, anterior tibial artery, first superficial femoral artery in its midportion. There was successful intervention with recanalization and in-line flow to the right foot. 04/22/2015 -- the patient was looking rather pale today and the daughter confirms that her hemoglobin was down to 6.9. she is being monitored by her PCP. Her Lasix dose was increased and her edema has gone down significantly. 04/29/2015 -- she had vascular studies done and the ABI on the right is 1.3 and the toe pressures were within normal limits. Her hemoglobin is still around 7 and she refuses to take any blood transfusions for religious reasons. Her potassium was normal. Objective Constitutional Pulse regular. Respirations normal and unlabored. Afebrile. Vitals Time Taken: 12:08 PM, Height: 62 in, Weight: 155 lbs, BMI: 28.3, Temperature: 97.6 F, Pulse: 82 bpm, Respiratory Rate: 16 breaths/min, Blood Pressure: 138/72 mmHg. Eyes Nonicteric. Reactive to light. Ears, Nose, Mouth, and Throat Lips, teeth, and gums WNL.Marland Kitchen Moist mucosa without lesions . Neck supple and nontender. No palpable supraclavicular or cervical adenopathy. Normal sized without goiter. Respiratory WNL. No retractions.. Breath sounds WNL, No rubs, rales, rhonchi, or wheeze.. Cardiovascular Heart rhythm and rate regular, no murmur or gallop.. Pedal Pulses WNL. she has significant edema of the right lower extremity and also of the left BKA stump.Marland Kitchen Bierlein, Belinda ([id number]) Lymphatic No adneopathy. No adenopathy. No adenopathy. Musculoskeletal Adexa without tenderness or enlargement.. Digits and nails w/o clubbing, cyanosis,  infection, petechiae, ischemia, or inflammatory conditions.Marland Kitchen Psychiatric Judgement and insight Intact.. No evidence of depression, anxiety, or agitation.. General Notes: TThe left BKA stump looks really good and has almost completely healed. The right third toe has almost healed but the right medial calcaneum still has some slough which was wiped up with a moist saline gauze. Integumentary (Hair, Skin) No suspicious lesions. No crepitus or fluctuance. No peri-wound warmth or erythema. No masses.. Wound #1 status is Open. Original cause of wound was Gradually Appeared. The wound is located on the Right Calcaneous. The wound measures 1.7cm length x 1.8cm width x 0.2cm depth; 2.403cm^2 area and 0.481cm^3 volume. Wound #2 status is Open. Original cause of wound was Gradually Appeared. The wound is located on the Right Toe Third. The wound measures 0.5cm length x 0.5cm width x 0.2cm depth; 0.196cm^2 area and 0.039cm^3 volume. The wound is limited to skin breakdown. There is no tunneling or undermining noted. There is a medium amount of serosanguineous drainage noted. The wound margin is distinct with the outline attached to the wound base. There is large (67-100%) pink, pale granulation within the wound bed. There is no necrotic tissue within the wound bed. The periwound skin appearance exhibited: Moist. The periwound skin appearance did not exhibit: Callus, Crepitus, Excoriation, Fluctuance, Friable, Induration, Localized Edema, Rash, Scarring, Dry/Scaly, Maceration, Atrophie Blanche, Cyanosis, Ecchymosis, Hemosiderin Staining, Mottled, Pallor, Rubor, Erythema. Periwound temperature was noted as No Abnormality. Wound #3 status is Open. Original cause of wound was Surgical Injury. The wound is located on the Left Amputation Site - Below Knee. The wound measures 0.2cm length x 1.8cm width x 0.5cm depth; 0.283cm^2 area and 0.141cm^3 volume. Assessment Active Problems ICD-10 E11.621 - Type 2  diabetes mellitus with foot ulcer I70.235 - Atherosclerosis of native arteries of right leg with ulceration of other part of foot Z89.512 - Acquired absence of left leg below knee L97.412 - Non-pressure chronic ulcer of right heel and midfoot with fat layer exposed Krisko, Marelly ([id number]) N82.956 - Non-pressure chronic ulcer of other part  of right lower leg with fat layer exposed T81.31XA - Disruption of external operation (surgical) wound, not elsewhere classified, initial encounter The left BKA stump will have some iodoform gauze placed over it and the right foot will have silver alginate used. She continues to improve and we will see her back next week. Plan Wound Cleansing: Wound #1 Right Calcaneous: Cleanse wound with mild soap and water May Shower, gently pat wound dry prior to applying new dressing. May shower with protection. Wound #2 Right Toe Third: Cleanse wound with mild soap and water May Shower, gently pat wound dry prior to applying new dressing. May shower with protection. Wound #3 Left Amputation Site - Below Knee: Cleanse wound with mild soap and water May Shower, gently pat wound dry prior to applying new dressing. May shower with protection. Skin Barriers/Peri-Wound Care: Wound #1 Right Calcaneous: Skin Prep Wound #2 Right Toe Third: Skin Prep Wound #3 Left Amputation Site - Below Knee: Skin Prep Primary Wound Dressing: Wound #1 Right Calcaneous: Aquacel Ag Wound #2 Right Toe Third: Aquacel Ag Wound #3 Left Amputation Site - Below Knee: Iodoform packing Gauze Secondary Dressing: Wound #1 Right Calcaneous: Boardered Foam Dressing Wound #2 Right Toe Third: Gauze and Kerlix/Conform Wound #3 Left Amputation Site - Below Knee: Boardered Foam Dressing Dressing Change Frequency: Wound #1 Right Calcaneous: Tutton, Allis ([id number]) Change dressing every other day. Wound #2 Right Toe Third: Change dressing every other day. Wound #3 Left Amputation  Site - Below Knee: Change dressing every other day. Follow-up Appointments: Wound #1 Right Calcaneous: Return Appointment in 1 week. Wound #2 Right Toe Third: Return Appointment in 1 week. 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 Home Health: Wound #1 Right Calcaneous: Continue Home Health Visits - [facility] Nurse may visit PRN to address patient s wound care needs. FACE TO FACE ENCOUNTER: MEDICARE and MEDICAID PATIENTS: I certify that this patient is under my care and that I had a face-to-face encounter that meets the physician face-to-face encounter requirements with this patient on this date. The encounter with the patient was in whole or in part for the following MEDICAL CONDITION: (primary reason for Home Healthcare) MEDICAL NECESSITY: I certify, that based on my findings, NURSING services are a medically necessary home health service. HOME BOUND STATUS: I certify that my clinical findings support that this patient is homebound (i.e., Due to illness or injury, pt requires aid of supportive devices such as crutches, cane, wheelchairs, walkers, the use of special transportation or the assistance of another person to leave their place of residence. There is a normal inability to leave the home and doing so requires considerable and taxing effort. Other absences are for medical reasons / religious services and are infrequent or of short duration when for other reasons). If current dressing causes regression in wound condition, may D/C ordered dressing product/s and apply Normal Saline Moist Dressing daily until next Wound Healing Center / Other MD appointment. Notify Wound Healing Center of regression in wound condition at [phone]. Please direct any NON-WOUND related issues/requests for orders to patient's Primary Care Physician Wound #2 Right Toe Third: Continue Home Health Visits - [facility] Health Nurse may visit PRN to address  patient s wound care needs. FACE TO FACE ENCOUNTER: MEDICARE and MEDICAID PATIENTS: I certify that this patient is under my care and that I had a face-to-face encounter that meets the physician face-to-face encounter requirements with this patient on this date. The encounter with  the patient was in whole or in part for the following MEDICAL CONDITION: (primary reason for Home Healthcare) MEDICAL NECESSITY: I certify, that based on my findings, NURSING services are a medically necessary home health service. HOME BOUND STATUS: I certify that my clinical findings support that this patient is homebound (i.e., Due to illness or injury, pt requires aid of supportive devices such as crutches, cane, wheelchairs, walkers, the use of special transportation or the assistance of another person to leave their place of residence. There is a normal inability to leave the home and doing so requires considerable and taxing effort. Other absences are for medical reasons / religious services and are infrequent or of short duration when for other reasons). If current dressing causes regression in wound condition, may D/C ordered dressing product/s and apply Normal Saline Moist Dressing daily until next Wound Healing Center / Other MD appointment. Notify Wound Healing Center of regression in wound condition at [phone]. Please direct any NON-WOUND related issues/requests for orders to patient's Primary Care Physician Wound #3 Left Amputation Site - Below Knee: Continue Home Health Visits - [facility] Health Nurse may visit PRN to address patient s wound care needs. FACE TO FACE ENCOUNTER: MEDICARE and MEDICAID PATIENTS: I certify that this patient is under Neilan, Mardy ([id number]) my care and that I had a face-to-face encounter that meets the physician face-to-face encounter requirements with this patient on this date. The encounter with the patient was in whole or in part for the following MEDICAL  CONDITION: (primary reason for Home Healthcare) MEDICAL NECESSITY: I certify, that based on my findings, NURSING services are a medically necessary home health service. HOME BOUND STATUS: I certify that my clinical findings support that this patient is homebound (i.e., Due to illness or injury, pt requires aid of supportive devices such as crutches, cane, wheelchairs, walkers, the use of special transportation or the assistance of another person to leave their place of residence. There is a normal inability to leave the home and doing so requires considerable and taxing effort. Other absences are for medical reasons / religious services and are infrequent or of short duration when for other reasons). If current dressing causes regression in wound condition, may D/C ordered dressing product/s and apply Normal Saline Moist Dressing daily until next Wound Healing Center / Other MD appointment. Notify Wound Healing Center of regression in wound condition at [phone]. Please direct any NON-WOUND related issues/requests for orders to patient's Primary Care Physician The left BKA stump will have some iodoform gauze placed over it and the right foot will have silver alginate used. She continues to improve and we will see her back next week. Electronic Signature(s) Signed: 05/20/2015 1:04:53 PM By: Evlyn Kanner MD, FACS Entered By: Evlyn Kanner on 05/20/2015 13:04:53 [name] ([id number]) -------------------------------------------------------------------------------- SuperBill Details Patient Name: [name] Date of Service: 05/20/2015 Medical Record Number: [id number] Patient Account Number: [id number] Date of Birth/Sex: 1920/12/16 (81 y.o. 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 L97.812 Non-pressure chronic ulcer of other part of right lower leg with fat layer exposed Disruption of external operation (surgical) wound,  not elsewhere classified, initial T81.31XA encounter Physician Procedures CPT4: Description Modifier Quantity Code [id number] - WC PHYS LEVEL 3 - EST PT 1 ICD-10 Description Diagnosis E11.621 Type 2 diabetes mellitus with foot ulcer I70.235 Atherosclerosis of native arteries of right leg with ulceration of other part of  foot T81.31XA Disruption of external operation (surgical) wound, not elsewhere classified, initial encounter 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

## 2015-05-27 ENCOUNTER — Encounter: Payer: Self-pay | Admitting: General Surgery

## 2015-05-27 ENCOUNTER — Encounter (HOSPITAL_BASED_OUTPATIENT_CLINIC_OR_DEPARTMENT_OTHER): Payer: Medicare Other | Admitting: General Surgery

## 2015-05-27 DIAGNOSIS — L97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: Secondary | ICD-10-CM | POA: Diagnosis not present

## 2015-05-27 DIAGNOSIS — E118 Type 2 diabetes mellitus with unspecified complications: Secondary | ICD-10-CM

## 2015-05-27 DIAGNOSIS — E11621 Type 2 diabetes mellitus with foot ulcer: Secondary | ICD-10-CM | POA: Insufficient documentation

## 2015-05-27 DIAGNOSIS — L97509 Non-pressure chronic ulcer of other part of unspecified foot with unspecified severity: Secondary | ICD-10-CM

## 2015-05-27 NOTE — Progress Notes (Addendum)
[name], [name] ([id number]) Visit Report for 05/27/2015 Arrival Information Details Patient Name: [name], [name] Date of Service: 05/27/2015 10:15 AM Medical Record Number: [id number] Patient Account Number: [id number] Date of Birth/Sex: 1921-02-03 (81 y.o. Female) Treating RN: Curtis Sites Primary Care Physician: Dorothey Baseman Other Clinician: Referring Physician: Terance Hart, DAVID Treating Physician/Extender: Elayne Snare in Treatment: 15 Visit Information History Since Last Visit Added or deleted any medications: No Patient Arrived: Wheel Chair Any new allergies or adverse reactions: No Arrival Time: 10:32 Had a fall or experienced change in No activities of daily living that may affect Accompanied By: neice risk of falls: Transfer Assistance: Manual Signs or symptoms of abuse/neglect since last No Patient Identification Verified: Yes visito Secondary Verification Process Yes Hospitalized since last visit: No Completed: Pain Present Now: No Patient Has Alerts: Yes Patient Alerts: DMII Electronic Signature(s) Signed: 05/27/2015 1:28:43 PM By: Ardath Sax MD Previous Signature: 05/27/2015 1:12:27 PM Version By: Curtis Sites Entered By: Ardath Sax on 05/27/2015 13:28:42 [name] ([id number]) -------------------------------------------------------------------------------- Clinic Level of Care Assessment Details Patient Name: [name] Date of Service: 05/27/2015 10:15 AM Medical Record Number: [id number] Patient Account Number: [id number] Date of Birth/Sex: June 17, 1921 (81 y.o. Female) Treating RN: Curtis Sites Primary Care Physician: Terance Hart, DAVID Other Clinician: Referring Physician: Terance Hart, DAVID Treating Physician/Extender: Elayne Snare in Treatment: 15 Clinic Level of Care Assessment Items TOOL 4 Quantity Score []  - Use when only an EandM is performed on FOLLOW-UP visit 0 ASSESSMENTS - Nursing Assessment / Reassessment []  -  Reassessment of Co-morbidities (includes updates in patient status) 0 X - Reassessment of Adherence to Treatment Plan 1 5 ASSESSMENTS - Wound and Skin Assessment / Reassessment []  - Simple Wound Assessment / Reassessment - one wound 0 X - Complex Wound Assessment / Reassessment - multiple wounds 3 5 []  - Dermatologic / Skin Assessment (not related to wound area) 0 ASSESSMENTS - Focused Assessment []  - Circumferential Edema Measurements - multi extremities 0 []  - Nutritional Assessment / Counseling / Intervention 0 []  - Lower Extremity Assessment (monofilament, tuning fork, pulses) 0 []  - Peripheral Arterial Disease Assessment (using hand held doppler) 0 ASSESSMENTS - Ostomy and/or Continence Assessment and Care []  - Incontinence Assessment and Management 0 []  - Ostomy Care Assessment and Management (repouching, etc.) 0 PROCESS - Coordination of Care X - Simple Patient / Family Education for ongoing care 1 15 []  - Complex (extensive) Patient / Family Education for ongoing care 0 X - Staff obtains Chiropractor, Records, Test Results / Process Orders 1 10 []  - Staff telephones HHA, Nursing Homes / Clarify orders / etc 0 []  - Routine Transfer to another Facility (non-emergent condition) 0 [name], [name] ([id number]) []  - Routine Hospital Admission (non-emergent condition) 0 []  - New Admissions / Manufacturing engineer / Ordering NPWT, Apligraf, etc. 0 []  - Emergency Hospital Admission (emergent condition) 0 X - Simple Discharge Coordination 1 10 []  - Complex (extensive) Discharge Coordination 0 PROCESS - Special Needs []  - Pediatric / Minor Patient Management 0 []  - Isolation Patient Management 0 []  - Hearing / Language / Visual special needs 0 []  - Assessment of Community assistance (transportation, D/C planning, etc.) 0 []  - Additional assistance / Altered mentation 0 []  - Support Surface(s) Assessment (bed, cushion, seat, etc.) 0 INTERVENTIONS - Wound Cleansing / Measurement []  -  Simple Wound Cleansing - one wound 0 X - Complex Wound Cleansing - multiple wounds 3 5 X - Wound Imaging (photographs - any number of wounds) 1 5 []  - Wound Tracing (instead of  photographs) 0 []  - Simple Wound Measurement - one wound 0 X - Complex Wound Measurement - multiple wounds 3 5 INTERVENTIONS - Wound Dressings []  - Small Wound Dressing one or multiple wounds 0 X - Medium Wound Dressing one or multiple wounds 3 15 []  - Large Wound Dressing one or multiple wounds 0 []  - Application of Medications - topical 0 []  - Application of Medications - injection 0 INTERVENTIONS - Miscellaneous []  - External ear exam 0 [name], [name] ([id number]) []  - Specimen Collection (cultures, biopsies, blood, body fluids, etc.) 0 []  - Specimen(s) / Culture(s) sent or taken to Lab for analysis 0 []  - Patient Transfer (multiple staff / Michiel Sites Lift / Similar devices) 0 []  - Simple Staple / Suture removal (25 or less) 0 []  - Complex Staple / Suture removal (26 or more) 0 []  - Hypo / Hyperglycemic Management (close monitor of Blood Glucose) 0 []  - Ankle / Brachial Index (ABI) - do not check if billed separately 0 X - Vital Signs 1 5 Has the patient been seen at the hospital within the last three years: Yes Total Score: 140 Level Of Care: New/Established - Level 4 Electronic Signature(s) Signed: 05/27/2015 1:12:27 PM By: Curtis Sites Entered By: Curtis Sites on 05/27/2015 10:59:37 [name] ([id number]) -------------------------------------------------------------------------------- Complex / Palliative Patient Assessment Details Patient Name: [name] Date of Service: 05/27/2015 10:15 AM Medical Record Number: [id number] Patient Account Number: [id number] Date of Birth/Sex: 12/08/1920 (81 y.o. Female) Treating RN: Huel Coventry Primary Care Physician: Dorothey Baseman Other Clinician: Referring Physician: Dorothey Baseman Treating Physician/Extender: Elayne Snare in Treatment:  15 Palliative Management Criteria Complex Wound Management Criteria Evaluation by a vascular surgeon has determined that the patient is not a revascularization candidate due to: Done all they can do Care Approach Wound Care Plan: Complex Wound Management Notes Anemia-patient refuses blood transfusions. Electronic Signature(s) Signed: 05/27/2015 12:42:01 PM By: Elliot Gurney, RN, BSN, Kim RN, BSN Entered By: Elliot Gurney, RN, BSN, Kim on 05/27/2015 10:47:06 [name] ([id number]) -------------------------------------------------------------------------------- Encounter Discharge Information Details Patient Name: [name] Date of Service: 05/27/2015 10:15 AM Medical Record Number: [id number] Patient Account Number: [id number] Date of Birth/Sex: 03/12/1921 (81 y.o. Female) Treating RN: Curtis Sites Primary Care Physician: Dorothey Baseman Other Clinician: Referring Physician: Dorothey Baseman Treating Physician/Extender: Elayne Snare in Treatment: 15 Encounter Discharge Information Items Discharge Pain Level: 0 Discharge Condition: Stable Ambulatory Status: Wheelchair Discharge Destination: Home Transportation: Private Auto Accompanied By: neice Schedule Follow-up Appointment: Yes Medication Reconciliation completed and provided to Patient/Care No Keric Zehren: Provided on Clinical Summary of Care: 05/27/2015 Form Type Recipient Paper Patient HS Electronic Signature(s) Signed: 05/27/2015 11:19:01 AM By: Gwenlyn Perking Previous Signature: 05/27/2015 11:07:09 AM Version By: Ardath Sax MD Entered By: Gwenlyn Perking on 05/27/2015 11:19:00 [name] ([id number]) -------------------------------------------------------------------------------- Lower Extremity Assessment Details Patient Name: [name] Date of Service: 05/27/2015 10:15 AM Medical Record Number: [id number] Patient Account Number: [id number] Date of Birth/Sex: April 07, 1921 (81 y.o. Female) Treating RN: Curtis Sites Primary Care Physician: Dorothey Baseman Other Clinician: Referring Physician: Terance Hart, DAVID Treating Physician/Extender: Elayne Snare in Treatment: 15 Vascular Assessment Pulses: Posterior Tibial Dorsalis Pedis Palpable: [Right:No] Extremity colors, hair growth, and conditions: Extremity Color: [Right:Red] Hair Growth on Extremity: [Right:No] Temperature of Extremity: [Right:Warm] Capillary Refill: [Right:< 3 seconds] Toe Nail Assessment Left: Right: Thick: Yes Discolored: Yes Deformed: No Improper Length and Hygiene: No Electronic Signature(s) Signed: 05/27/2015 1:12:27 PM By: Curtis Sites Entered By: Curtis Sites on 05/27/2015 10:51:31 Glasby, Leiyah ([id number]) -------------------------------------------------------------------------------- Multi Wound Chart Details Patient  Name: [name], [name] Date of Service: 05/27/2015 10:15 AM Medical Record Number: [id number] Patient Account Number: [id number] Date of Birth/Sex: 16-Aug-1921 (81 y.o. 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] Comorbid History: [1:Arrhythmia, Deep Vein Thrombosis, Peripheral Arterial Disease, Type II Arterial Disease, Type II Arterial Disease, Type II Diabetes] [2:Arrhythmia, Deep Vein Thrombosis, Peripheral Diabetes] [3:Arrhythmia, Deep  Vein Thrombosis,  Peripheral Diabetes] Date Acquired: [1:11/04/2014] [2:11/04/2014] [3:01/03/2015] Weeks of Treatment: [1:15] [2:15] [3:14] Wound Status: [1:Open] [2:Open] [3:Open] Measurements L x W x D 1.9x1.9x0.2 [2:0.3x0.3x0.1] [3:0.8x4.3x0.4] (cm) Area (cm) : [1:2.835] [2:0.071] [3:2.702] Volume (cm) : [1:0.567] [2:0.007] [3:1.081] % Reduction in Area: [1:48.40%] [2:92.50%] [3:73.90%] % Reduction in Volume: -3.10% [2:92.60%] [3:73.90%] Classification: [1:Full Thickness Without Exposed Support Structures] [2:Full Thickness Without Exposed Support Structures] [3:Full Thickness Without Exposed Support Structures] HBO Classification: [1:Grade 1] [2:Grade 1] [3:Grade 1] Exudate Amount: [1:Medium] [2:Medium] [3:Small] Exudate Type: [1:Serous] [2:Serosanguineous] [3:Serous] Exudate Color: [1:amber] [2:red, brown] [3:amber] Wound Margin: [1:Flat and Intact] [2:Distinct, outline attached Flat and Intact] Granulation Amount: [1:Large (67-100%)] [2:Large (67-100%)] [3:Large (67-100%)] Granulation Quality: [1:Red] [2:Pink, Pale] [3:Red] Necrotic Amount: [1:Small (1-33%)] [2:Small (1-33%)] [3:Small (1-33%)] Exposed Structures: Acklin, Tymia ([id number]) Fascia: No Fascia: No Fascia: No Fat: No Fat: No Fat: No Tendon: No Tendon: No Tendon: No Muscle: No Muscle: No Muscle: No Joint: No Joint: No Joint: No Bone: No Bone: No Bone: No Limited to Skin Limited to Skin Limited to Skin Breakdown Breakdown Breakdown Epithelialization: None Small (1-33%) None Periwound Skin Texture: Edema: No Edema: No Edema: No Excoriation: No Excoriation: No Excoriation: No Induration: No Induration: No Induration: No Callus: No Callus: No Callus: No Crepitus: No Crepitus: No Crepitus: No Fluctuance: No Fluctuance: No Fluctuance: No Friable: No Friable: No Friable: No Rash: No Rash: No Rash: No Scarring: No Scarring: No Scarring: No Periwound Skin Maceration: Yes Moist:  Yes Maceration: No Moisture: Moist: Yes Maceration: No Moist: No Dry/Scaly: No Dry/Scaly: No Dry/Scaly: No Periwound Skin Color: Atrophie Blanche: No Atrophie Blanche: No Atrophie Blanche: No Cyanosis: No Cyanosis: No Cyanosis: No Ecchymosis: No Ecchymosis: No Ecchymosis: No Erythema: No Erythema: No Erythema: No Hemosiderin Staining: No Hemosiderin Staining: No Hemosiderin Staining: No Mottled: No Mottled: No Mottled: No Pallor: No Pallor: No Pallor: No Rubor: No Rubor: No Rubor: No Temperature: No Abnormality No Abnormality N/A Tenderness on Yes Yes Yes Palpation: Wound Preparation: Ulcer Cleansing: Ulcer Cleansing: Ulcer Cleansing: Rinsed/Irrigated with Rinsed/Irrigated with Rinsed/Irrigated with Saline Saline Saline Topical Anesthetic Topical Anesthetic Topical Anesthetic Applied: Other: lidocaine Applied: Other: idocaine Applied: Other: lidocaine 4% 4% 4% Treatment Notes Electronic Signature(s) Signed: 05/27/2015 1:12:27 PM By: Curtis Sites Entered By: Curtis Sites on 05/27/2015 10:55:35 [name] ([id number]) -------------------------------------------------------------------------------- Multi-Disciplinary Care Plan Details Patient Name: [name] Date of Service: 05/27/2015 10:15 AM Medical Record Number: [id number] Patient Account Number: [id number] Date of Birth/Sex: 1921/03/08 (81 y.o. Female) Treating RN: Curtis Sites Primary Care Physician: Dorothey Baseman Other Clinician: Referring Physician: Dorothey Baseman Treating Physician/Extender: Elayne Snare in Treatment: 15 Active Inactive Abuse / Safety / Falls / Self Care Management Nursing Diagnoses: Impaired physical mobility  Potential for falls Goals: Patient will remain injury free Date Initiated: 02/11/2015 Goal Status: Active Patient/caregiver will verbalize understanding of skin care regimen Date Initiated: 02/11/2015 Goal Status: Active Patient/caregiver will  verbalize/demonstrate measures taken to prevent injury and/or falls Date Initiated: 02/11/2015 Goal Status: Active Patient/caregiver will verbalize/demonstrate understanding of what to do in case of emergency Date Initiated: 02/11/2015 Goal Status: Active Interventions: Assess fall risk on admission and as needed Provide education on fall prevention Provide education on safe transfers Treatment Activities: Patient referred to home care : 05/27/2015 Notes: Nutrition Nursing Diagnoses: Imbalanced nutrition Potential for alteratiion in Nutrition/Potential for imbalanced nutrition Sterne, Tamula ([id number]) Goals: Patient/caregiver verbalizes understanding of need to maintain therapeutic glucose control per primary care physician Date Initiated: 02/11/2015 Goal Status: Active Patient/caregiver will maintain therapeutic glucose control Date Initiated: 02/11/2015 Goal Status: Active Interventions: Assess HgA1c results as ordered upon admission and as needed Provide education on elevated blood sugars and impact on wound healing Provide education on nutrition Treatment Activities: Education provided on Nutrition : 04/22/2015 Obtain HgA1c : 05/27/2015 Notes: Wound/Skin Impairment Nursing Diagnoses: Impaired tissue integrity Knowledge deficit related to ulceration/compromised skin integrity Goals: Patient/caregiver will verbalize understanding of skin care regimen Date Initiated: 02/11/2015 Goal Status: Active Ulcer/skin breakdown will heal within 14 weeks Date Initiated: 02/11/2015 Goal Status: Active Interventions: Assess patient/caregiver ability to obtain necessary supplies Assess patient/caregiver ability to perform ulcer/skin care regimen upon admission and as needed Assess ulceration(s) every visit Provide education on ulcer and skin care Treatment Activities: Skin care regimen initiated : 05/27/2015 Topical wound management initiated : 05/27/2015 Notes: [name], [name]  ([id number]) Electronic Signature(s) Signed: 05/27/2015 1:12:27 PM By: Curtis Sites Entered By: Curtis Sites on 05/27/2015 10:55:26 [name] ([id number]) -------------------------------------------------------------------------------- Patient/Caregiver Education Details Patient Name: [name] Date of Service: 05/27/2015 10:15 AM Medical Record Number: [id number] Patient Account Number: [id number] Date of Birth/Gender: 1921/09/19 (81 y.o. Female) Treating RN: Curtis Sites Primary Care Physician: Dorothey Baseman Other Clinician: Referring Physician: Terance Hart DAVID Treating Physician/Extender: Elayne Snare in Treatment: 15 Education Assessment Education Provided To: Patient and Caregiver Education Topics Provided Wound/Skin Impairment: Handouts: Other: wound care as ordered Methods: Demonstration, Explain/Verbal Responses: State content correctly Electronic Signature(s) Signed: 05/27/2015 1:12:27 PM By: Curtis Sites Previous Signature: 05/27/2015 11:07:19 AM Version By: Ardath Sax MD Entered By: Curtis Sites on 05/27/2015 11:17:11 [name] ([id number]) -------------------------------------------------------------------------------- Wound Assessment Details Patient Name: [name] Date of Service: 05/27/2015 10:15 AM Medical Record Number: [id number] Patient Account Number: [id number] Date of Birth/Sex: May 10, 1921 (81 y.o. 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 Wound Open Wounding Event: Gradually Appeared Status: Date Acquired: 11/04/2014 Comorbid Arrhythmia, Deep Vein Thrombosis, Weeks Of Treatment: 15 History: Peripheral Arterial Disease, Type II Clustered Wound:  No Diabetes Photos Photo Uploaded By: Curtis Sites on 05/27/2015 12:32:04 Wound Measurements Length: (cm) 1.9 Width: (cm) 1.9 Depth: (cm) 0.2 Area: (cm) 2.835 Volume: (cm) 0.567 % Reduction in Area: 48.4% % Reduction in Volume: -3.1% Epithelialization: None Tunneling: No Undermining: No Wound Description Full Thickness Without Foul Odor Aft Classification: Exposed Support Structures Diabetic Severity Grade 1 (Wagner): Wound Margin: Flat and Intact Exudate Amount: Medium Exudate Type: Serous Exudate Color: amber er Cleansing: No Wound Bed Granulation Amount: Large (67-100%) Exposed Structure Sabine, Danylle ([id number]) Granulation Quality: Red Fascia Exposed: No Necrotic Amount: Small (1-33%) Fat Layer Exposed: No Necrotic Quality: Adherent  Slough Tendon Exposed: No Muscle Exposed: No Joint Exposed: No Bone Exposed: No Limited to Skin Breakdown Periwound Skin Texture Texture Color No Abnormalities Noted: No No Abnormalities Noted: No Callus: No Atrophie Blanche: No Crepitus: No Cyanosis: No Excoriation: No Ecchymosis: No Fluctuance: No Erythema: No Friable: No Hemosiderin Staining: No Induration: No Mottled: No Localized Edema: No Pallor: No Rash: No Rubor: No Scarring: No Temperature / Pain Moisture Temperature: No Abnormality No Abnormalities Noted: No Tenderness on Palpation: Yes Dry / Scaly: No Maceration: Yes Moist: Yes Wound Preparation Ulcer Cleansing: Rinsed/Irrigated with Saline Topical Anesthetic Applied: Other: lidocaine 4%, Treatment Notes Wound #1 (Right Calcaneous) 1. Cleansed with: Clean wound with Normal Saline 2. Anesthetic Topical Lidocaine 4% cream to wound bed prior to debridement 4. Dressing Applied: Aquacel Ag Other dressing (specify in notes) 5. Secondary Dressing Applied Gauze and Kerlix/Conform Notes xtrasorb on calcaneous Electronic Signature(s) Signed: 05/27/2015 1:12:27 PM By: Mechele Dawley, Harrison ([id number]) Entered By: Curtis Sites on 05/27/2015 10:48:39 [name] ([id number]) -------------------------------------------------------------------------------- Wound Assessment Details Patient Name: [name] Date of Service: 05/27/2015 10:15 AM Medical Record Number: [id number] Patient Account Number: [id number] Date of Birth/Sex: 01/13/1921 (81 y.o. Female) Treating RN: Curtis Sites Primary Care Physician: Terance Hart, DAVID Other Clinician: Referring Physician: Terance Hart, DAVID Treating Physician/Extender: Elayne Snare in Treatment: 15 Wound Status Wound Number: 2 Primary Arterial Insufficiency Ulcer Etiology: Wound Location: Right Toe Third Wound Open Wounding Event: Gradually Appeared Status: Date Acquired: 11/04/2014 Comorbid Arrhythmia, Deep Vein Thrombosis, Weeks Of Treatment: 15 History: Peripheral Arterial Disease, Type II Clustered Wound: No Diabetes Photos Photo Uploaded By: Curtis Sites on 05/27/2015 12:32:04 Wound Measurements Length: (cm) 0.3 Width: (cm) 0.3 Depth: (cm) 0.1 Area: (cm) 0.071 Volume: (cm) 0.007 % Reduction in Area: 92.5% % Reduction in Volume: 92.6% Epithelialization: Small (1-33%) Tunneling: No Undermining: No Wound Description Full Thickness Without Exposed Classification: Support Structures Diabetic Severity Grade 1 (Wagner): Wound Margin: Distinct, outline attached Exudate Amount: Medium Exudate Type: Serosanguineous Exudate Color: red, brown Foul Odor After Cleansing: No Wound Bed Granulation Amount: Large (67-100%) Exposed Structure Sofia, Sarely ([id number]) Granulation Quality: Pink, Pale Fascia Exposed: No Necrotic Amount: Small (1-33%) Fat Layer Exposed: No Necrotic Quality: Adherent Slough Tendon Exposed: No Muscle Exposed: No Joint Exposed: No Bone Exposed: No Limited to Skin Breakdown Periwound Skin Texture Texture Color No Abnormalities Noted: No No  Abnormalities Noted: No Callus: No Atrophie Blanche: No Crepitus: No Cyanosis: No Excoriation: No Ecchymosis: No Fluctuance: No Erythema: No Friable: No Hemosiderin Staining: No Induration: No Mottled: No Localized Edema: No Pallor: No Rash: No Rubor: No Scarring: No Temperature / Pain Moisture Temperature: No Abnormality No Abnormalities Noted: No Tenderness on Palpation: Yes Dry / Scaly: No Maceration: No Moist: Yes Wound Preparation Ulcer Cleansing: Rinsed/Irrigated with Saline Topical Anesthetic Applied: Other: idocaine 4%, Treatment Notes Wound #2 (Right Toe Third) 1. Cleansed with: Clean wound with Normal Saline 2. Anesthetic Topical Lidocaine 4% cream to wound bed prior to debridement 4. Dressing Applied: Aquacel Ag Other dressing (specify in notes) 5. Secondary Dressing Applied Gauze and Kerlix/Conform Notes xtrasorb on calcaneous Electronic Signature(s) Signed: 05/27/2015 1:12:27 PM By: Mechele Dawley, Emalene ([id number]) Entered By: Curtis Sites on 05/27/2015 10:49:20 [name] ([id number]) -------------------------------------------------------------------------------- Wound Assessment Details Patient Name: [name] Date of Service: 05/27/2015 10:15 AM Medical Record Number: [id number] Patient Account Number: [id number] Date of Birth/Sex: 1921-04-09 (81 y.o. Female) Treating RN: Curtis Sites Primary Care Physician: Dorothey Baseman Other Clinician: Referring Physician: Dorothey Baseman Treating Physician/Extender: Elayne Snare in Treatment:  15 Wound Status Wound Number: 3 Primary Dehisced Wound Etiology: Wound Location: Left Amputation Site - Below Knee Wound Open Status: Wounding Event: Surgical Injury Comorbid Arrhythmia, Deep Vein Thrombosis, Date Acquired: 01/03/2015 History: Peripheral Arterial Disease, Type II Weeks Of Treatment: 14 Diabetes Clustered Wound: No Photos Photo Uploaded By: Curtis Sites on  05/27/2015 12:32:35 Wound Measurements Length: (cm) 0.8 Width: (cm) 4.3 Depth: (cm) 0.4 Area: (cm) 2.702 Volume: (cm) 1.081 % Reduction in Area: 73.9% % Reduction in Volume: 73.9% Epithelialization: None Tunneling: No Undermining: No Wound Description Full Thickness Without Classification: Exposed Support Structures Diabetic Severity Grade 1 (Wagner): Wound Margin: Flat and Intact Exudate Amount: Small Exudate Type: Serous Exudate Color: amber Foul Odor After Cleansing: No Wound Bed Granulation Amount: Large (67-100%) Exposed Structure Mixon, Myranda ([id number]) Granulation Quality: Red Fascia Exposed: No Necrotic Amount: Small (1-33%) Fat Layer Exposed: No Necrotic Quality: Adherent Slough Tendon Exposed: No Muscle Exposed: No Joint Exposed: No Bone Exposed: No Limited to Skin Breakdown Periwound Skin Texture Texture Color No Abnormalities Noted: No No Abnormalities Noted: No Callus: No Atrophie Blanche: No Crepitus: No Cyanosis: No Excoriation: No Ecchymosis: No Fluctuance: No Erythema: No Friable: No Hemosiderin Staining: No Induration: No Mottled: No Localized Edema: No Pallor: No Rash: No Rubor: No Scarring: No Temperature / Pain Moisture Tenderness on Palpation: Yes No Abnormalities Noted: No Dry / Scaly: No Maceration: No Moist: No Wound Preparation Ulcer Cleansing: Rinsed/Irrigated with Saline Topical Anesthetic Applied: Other: lidocaine 4%, Treatment Notes Wound #3 (Left Amputation Site - Below Knee) 1. Cleansed with: Clean wound with Normal Saline 2. Anesthetic Topical Lidocaine 4% cream to wound bed prior to debridement 4. Dressing Applied: Iodoform packing Gauze Other dressing (specify in notes) 7. Secured with Tape Notes [employer] operational [employer]) Signed: 05/27/2015 1:12:27 PM By: Mechele Dawley, Aylah ([id number]) Entered By: Curtis Sites on 05/27/2015 10:50:02 [name]  ([id number]) -------------------------------------------------------------------------------- Vitals Details Patient Name: [name] Date of Service: 05/27/2015 10:15 AM Medical Record Number: [id number] Patient Account Number: [id number] Date of Birth/Sex: 10/27/1920 (81 y.o. Female) Treating RN: Curtis Sites Primary Care Physician: Terance Hart, DAVID Other Clinician: Referring Physician: Terance Hart, DAVID Treating Physician/Extender: Elayne Snare in Treatment: 15 Vital Signs Time Taken: 10:34 Temperature (F): 97.9 Height (in): 62 Pulse (bpm): 51 Weight (lbs): 155 Respiratory Rate (breaths/min): 16 Body Mass Index (BMI): 28.3 Blood Pressure (mmHg): 128/51 Reference Range: 80 - 120 mg / dl Electronic Signature(s) Signed: 05/27/2015 1:12:27 PM By: Curtis Sites Entered By: Curtis Sites on 05/27/2015 10:36:49

## 2015-05-27 NOTE — Progress Notes (Addendum)
SHADAI, MCCLANE ([id number]) Visit Report for 05/27/2015 Chief Complaint Document Details Patient Name: [name], [name] Date of Service: 05/27/2015 10:15 AM Medical Record Number: [id number] Patient Account Number: [id number] Date of Birth/Sex: [birth year] (81 y.o. Female) Treating RN: Curtis Sites Primary Care Physician: Dorothey Baseman Other Clinician: Referring Physician: Dorothey Baseman Treating Physician/Extender: Elayne Snare in Treatment: 15 Information Obtained from: Patient Chief Complaint Patient presents to the wound care center for a consult due non healing wound. 81 year old patient with comes with a history of a ulcerated area to the right third toe and the right heel which she's had for about 2 months. Electronic Signature(s) Signed: 05/27/2015 10:20:22 AM By: Ardath Sax MD Entered By: Ardath Sax on 05/27/2015 10:20:22 [name] ([id number]) -------------------------------------------------------------------------------- HPI Details Patient Name: [name] Date of Service: 05/27/2015 10:15 AM Medical Record Number: [id number] Patient Account Number: [id number] Date of Birth/Sex: April 11, 1921 (81 y.o. Female) Treating RN: Curtis Sites Primary Care Physician: Dorothey Baseman Other Clinician: Referring Physician: Terance Hart, DAVID Treating Physician/Extender: Elayne Snare in Treatment: 15 History of Present Illness Location: right medial heel and right third toe Quality: Patient reports experiencing a dull pain to affected area(s). Severity: Patient states wound are getting worse. Duration: Patient has had the wound for > 3 months prior to seeking treatment at the wound center Timing: Pain in wound is Intermittent (comes and goes Context: The wound appeared gradually over time Modifying Factors: Consults to this date include:vascular surgeon and several recent surgeries. Associated Signs and Symptoms: Patient reports having foul odor and  drainage from the left below-knee amputation site. HPI Description: A pleasant 81 year old patient who is known to have diabetes mellitus for several years has recently gone through a series of operations with the vascular surgeon Dr. Gilda Crease. In January and February she had several surgeries on the left lower extremity with an attempt to have limb salvage for gangrenous changes of her forefoot. 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 On 01/07/2015 she also had a right lower extremity distal runoff with a angioplasty of the right anterior tibial artery to maximize her blood flow to the foot. She recently had her staples removed at Dr. Marijean Heath office on 01/31/2015 and at that time a right lower extremity duplex was done which showed patent vessels and a patent stent with distal occluded posterior tibial artery. Though the duplex was noncritical the recommendations from the PA at the vascular surgery office was that of a angiogram to be done but the patient said she would rather try some bone care before. The patient has received doxycycline and Cipro in the recent past and she takes oral medications for her diabetes. Other than that I reviewed her list of all her medications. No recent hemoglobin A1c has been done and no recent x-rays of the right foot have been done. 02/18/2015 -- x-ray of the right foot was done on 02/11/2015 and it shows no evidence of acute osteomyelitis of the third toe or of the calcaneus. She had gone to the vascular surgery office and they had noted that there is dehiscence of the left part of the amputation site of the below-knee wound and they have asked [place] to kindly take over the care of this. There've been doing dressings for the right heel and right third toe. 02/25/2015 -- we have received notes from the vascular group who saw her last on 02/13/2015 and she was seen by the PA Ms.  Cala Bradford  Stegmayer. She had recommended that the patient continue to follow with [place] for wound care including management of the left BKA stump, which has had dehiscence of the lateral part. They will consider repeating a arterial duplex or angiogram if the right lower extremity does not heal within a reasonable period of time. 03/18/2015 -- he saw the vascular surgeon Dr. Gilda Crease and he has set her up for an angioplasty sometime in the middle of July. she is doing well otherwise. GIANA, CASTNER ([id number]) 04/10/2015 -- the patient had a procedure done on 04/08/2015 and this was a right angioplasty of the dorsalis pedis, anterior tibial artery, first superficial femoral artery in its midportion. There was successful intervention with recanalization and in-line flow to the right foot. 04/22/2015 -- the patient was looking rather pale today and the daughter confirms that her hemoglobin was down to 6.9. she is being monitored by her PCP. Her Lasix dose was increased and her edema has gone down significantly. 04/29/2015 -- she had vascular studies done and the ABI on the right is 1.3 and the toe pressures were within normal limits. Her hemoglobin is still around 7 and she refuses to take any blood transfusions for religious reasons. Her potassium was normal. Electronic Signature(s) Signed: 05/27/2015 10:21:01 AM By: Ardath Sax MD Entered By: Ardath Sax on 05/27/2015 10:21:00 [name] ([id number]) -------------------------------------------------------------------------------- Physical Exam Details Patient Name: [name] Date of Service: 05/27/2015 10:15 AM Medical Record Number: [id number] Patient Account Number: [id number] Date of Birth/Sex: Jan 01, 1921 (81 y.o. Female) Treating RN: Curtis Sites Primary Care Physician: Dorothey Baseman Other Clinician: Referring Physician: Dorothey Baseman Treating Physician/Extender: Elayne Snare in Treatment: 15 Electronic  Signature(s) Signed: 05/27/2015 10:21:13 AM By: Ardath Sax MD Entered By: Ardath Sax on 05/27/2015 10:21:12 [name] ([id number]) -------------------------------------------------------------------------------- Physician Orders Details Patient Name: [name] Date of Service: 05/27/2015 10:15 AM Medical Record Number: [id number] Patient Account Number: [id number] Date of Birth/Sex: 04-18-1921 (81 y.o. 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 o May shower with protection. Wound #2 Right Toe Third o Cleanse wound with mild soap and water o May Shower, gently pat wound dry prior to applying new dressing. o May shower with protection. Wound #3 Left Amputation Site - Below Knee o Cleanse wound with mild soap and water o May Shower, gently pat wound dry prior to applying new dressing. o May shower with protection. Primary Wound Dressing Wound #1 Right Calcaneous o Aquacel Ag Wound #2 Right Toe Third o Aquacel Ag Wound #3 Left Amputation Site - Below  Knee o Iodoform packing Gauze Propps, Shatha ([id number]) Secondary Dressing Wound #1 Right Calcaneous o Gauze and Kerlix/Conform o XtraSorb Wound #2 Right Toe Third o Gauze and Kerlix/Conform Wound #3 Left Amputation Site -  Below Knee o Gauze and Kerlix/Conform o XtraSorb Dressing Change Frequency Wound #1 Right Calcaneous o Change dressing every other day. Wound #2 Right Toe Third o Change dressing every other day. Wound #3 Left Amputation Site - Below Knee o Change dressing every other day. Follow-up Appointments Wound #1 Right Calcaneous o Return Appointment in 1 week. Wound #2 Right Toe Third o Return Appointment in 1 week. Wound #3 Left Amputation Site - Below Knee o Return Appointment in 1 week. Home Health Wound #1 Right Calcaneous o Continue Home Health Visits - [facility] o Home Health Nurse may visit PRN to address patientos wound care needs. o FACE TO FACE ENCOUNTER: MEDICARE and MEDICAID PATIENTS: I certify that this patient is under my care and that I had a face-to-face encounter that meets the physician face-to-face encounter requirements with this patient on this date. The encounter with the patient was in whole or in part for the following MEDICAL CONDITION: (primary reason for Home Healthcare) MEDICAL NECESSITY: I certify, that based on my findings, NURSING services are a medically necessary home health service. HOME BOUND STATUS: I certify that my clinical findings support that this patient is homebound (i.e., Due to illness or injury, pt requires aid of supportive devices such as crutches, cane, wheelchairs, walkers, the use of special transportation or the assistance of another person to leave their place of residence. There is a normal inability to leave the home and doing so requires considerable and taxing effort. Other absences are for medical reasons / religious services and are infrequent or of short duration when for  other reasons). VANDALL, Anum ([id number]) o If current dressing causes regression in wound condition, may D/C ordered dressing product/s and apply Normal Saline Moist Dressing daily until next Wound Healing Center / Other MD appointment. Notify Wound Healing Center of regression in wound condition at [phone]. o Please direct any NON-WOUND related issues/requests for orders to patient's Primary Care Physician Wound #2 Right Toe Third o Continue Home Health Visits - [facility] o Home Health Nurse may visit PRN to address patientos wound care needs. o FACE TO FACE ENCOUNTER: MEDICARE and MEDICAID PATIENTS: I certify that this patient is under my care and that I had a face-to-face encounter that meets the physician face-to-face encounter requirements with this patient on this date. The encounter with the patient was in whole or in part for the following MEDICAL CONDITION: (primary reason for Home Healthcare) MEDICAL NECESSITY: I certify, that based on my findings, NURSING services are a medically necessary home health service. HOME BOUND STATUS: I certify that my clinical findings support that this patient is homebound (i.e., Due to illness or injury, pt requires aid of supportive devices such as crutches, cane, wheelchairs, walkers, the use of special transportation or the assistance of another person to leave their place of residence. There is a normal inability to leave the home and doing so requires considerable and taxing effort. Other absences are for medical reasons / religious services and are infrequent or of short duration when for other reasons). o If current dressing causes regression in wound condition, may D/C ordered dressing product/s and apply Normal Saline Moist Dressing daily until next Wound Healing Center / Other MD appointment. Notify Wound Healing Center of regression in wound condition at [phone]. o Please direct any NON-WOUND related  issues/requests for orders to patient's Primary Care Physician Wound #3 Left Amputation Site - Below Knee o Continue Home Health Visits - [facility] o Home Health Nurse may visit PRN to  address patientos wound care needs. o FACE TO FACE ENCOUNTER: MEDICARE and MEDICAID PATIENTS: I certify that this patient is under my care and that I had a face-to-face encounter that meets the physician face-to-face encounter requirements with this patient on this date. The encounter with the patient was in whole or in part for the following MEDICAL CONDITION: (primary reason for Home Healthcare) MEDICAL NECESSITY: I certify, that based on my findings, NURSING services are a medically necessary home health service. HOME BOUND STATUS: I certify that my clinical findings support that this patient is homebound (i.e., Due to illness or injury, pt requires aid of supportive devices such as crutches, cane, wheelchairs, walkers, the use of special transportation or the assistance of another person to leave their place of residence. There is a normal inability to leave the home and doing so requires considerable and taxing effort. Other absences are for medical reasons / religious services and are infrequent or of short duration when for other reasons). o If current dressing causes regression in wound condition, may D/C ordered dressing product/s and apply Normal Saline Moist Dressing daily until next Wound Healing Center / Other MD appointment. Notify Wound Healing Center of regression in wound condition at [phone]. o Please direct any NON-WOUND related issues/requests for orders to patient's Primary Care Physician Electronic Signature(s) [place], [place] ([id number]) Signed: 05/27/2015 1:12:27 PM By: Curtis Sites Entered By: Curtis Sites on 05/27/2015 10:58:15 [name] ([id number]) -------------------------------------------------------------------------------- Problem List Details Patient  Name: [name] Date of Service: 05/27/2015 10:15 AM Medical Record Number: [id number] Patient Account Number: [id number] Date of Birth/Sex: Nov 12, 1920 (81 y.o. Female) Treating RN: Curtis Sites Primary Care Physician: Dorothey Baseman Other Clinician: Referring Physician: Dorothey Baseman Treating Physician/Extender: Elayne Snare in Treatment: 15 Active Problems ICD-10 Encounter Code Description Active Date Diagnosis E11.621 Type 2 diabetes mellitus with foot ulcer 02/11/2015 Yes I70.235 Atherosclerosis of native arteries of right leg with 02/11/2015 Yes ulceration of other part of foot Z89.512 Acquired absence of left leg below knee 02/11/2015 Yes L97.412 Non-pressure chronic ulcer of right heel and midfoot with 02/11/2015 Yes fat layer exposed L97.812 Non-pressure chronic ulcer of other part of right lower leg 02/11/2015 Yes with fat layer exposed T81.31XA Disruption of external operation (surgical) wound, not 02/18/2015 Yes elsewhere classified, initial encounter Inactive Problems Resolved Problems Electronic Signature(s) Signed: 05/27/2015 1:31:28 PM By: Ardath Sax MD Previous Signature: 05/27/2015 10:19:59 AM Version By: Ardath Sax MD Entered By: Ardath Sax on 05/27/2015 13:31:27 [name] ([id number]) -------------------------------------------------------------------------------- Progress Note Details Patient Name: [name] Date of Service: 05/27/2015 10:15 AM Medical Record Number: [id number] Patient Account Number: [id number] Date of Birth/Sex: 10-19-1920 (81 y.o. Female) Treating RN: Curtis Sites Primary Care Physician: Dorothey Baseman Other Clinician: Referring Physician: Dorothey Baseman Treating Physician/Extender: Elayne Snare in Treatment: 15 Subjective Chief Complaint Information obtained from Patient Patient presents to the wound care center for a consult due non healing wound. 81 year old patient with comes with a  history of a ulcerated area to the right third toe and the right heel which she's had for about 2 months. History of Present Illness (HPI) The following HPI elements were documented for the patient's wound: Location: right medial heel and right third toe Quality: Patient reports experiencing a dull pain to affected area(s). Severity: Patient states wound are getting worse. Duration: Patient has had the wound for > 3 months prior to seeking treatment at the wound center Timing: Pain in wound is Intermittent (comes and goes Context: The wound appeared gradually over time  Modifying Factors: Consults to this date include:vascular surgeon and several recent surgeries. Associated Signs and Symptoms: Patient reports having foul odor and drainage from the left below-knee amputation site. A pleasant 81 year old patient who is known to have diabetes mellitus for several years has recently gone through a series of operations with the vascular surgeon Dr. Gilda Crease. In January and February she had several surgeries on the left lower extremity with an attempt to have limb salvage for gangrenous changes of her forefoot. 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 On 01/07/2015 she also had a right lower extremity distal runoff with a angioplasty of the right anterior tibial artery to maximize her blood flow to the foot. She recently had her staples removed at Dr. Marijean Heath office on 01/31/2015 and at that time a right lower extremity duplex was done which showed patent vessels and a patent stent with distal occluded posterior tibial artery. Though the duplex was noncritical the recommendations from the PA at the vascular surgery office was that of a angiogram to be done but the patient said she would rather try some bone care before. The patient has received doxycycline and Cipro in the recent past and she takes oral medications for  her diabetes. Other than that I reviewed her list of all her medications. No recent hemoglobin A1c has been done and no recent x-rays of the right foot have been done. 02/18/2015 -- x-ray of the right foot was done on 02/11/2015 and it shows no evidence of acute osteomyelitis of the third toe or of the calcaneus. She had gone to the vascular surgery office and they had noted that there is dehiscence of the left part of the amputation site of the below-knee wound and they have asked [place] to kindly take over the care of this. There've been doing dressings for the right heel and right third toe. CELLIE, DARDIS ([id number]) 02/25/2015 -- we have received notes from the vascular group who saw her last on 02/13/2015 and she was seen by the PA Ms. Cleda Daub. She had recommended that the patient continue to follow with [place] for wound care including management of the left BKA stump, which has had dehiscence of the lateral part. They will consider repeating a arterial duplex or angiogram if the right lower extremity does not heal within a reasonable period of time. 03/18/2015 -- he saw the vascular surgeon Dr. Gilda Crease and he has set her up for an angioplasty sometime in the middle of July. she is doing well otherwise. 04/10/2015 -- the patient had a procedure done on 04/08/2015 and this was a right angioplasty of the dorsalis pedis, anterior tibial artery, first superficial femoral artery in its midportion. There was successful intervention with recanalization and in-line flow to the right foot. 04/22/2015 -- the patient was looking rather pale today and the daughter confirms that her hemoglobin was down to 6.9. she is being monitored by her PCP. Her Lasix dose was increased and her edema has gone down significantly. 04/29/2015 -- she had vascular studies done and the ABI on the right is 1.3 and the toe pressures were within normal limits. Her hemoglobin is still around 7 and she refuses to take  any blood transfusions for religious reasons. Her potassium was normal. Objective Constitutional Vitals Time Taken: 10:34 AM, Height: 62 in, Weight: 155 lbs, BMI: 28.3, Temperature: 97.9 F, Pulse: 51 bpm, Respiratory Rate: 16 breaths/min, Blood Pressure: 128/51 mmHg. Integumentary (Hair,  Skin) Wound #1 status is Open. Original cause of wound was Gradually Appeared. The wound is located on the Right Calcaneous. The wound measures 1.9cm length x 1.9cm width x 0.2cm depth; 2.835cm^2 area and 0.567cm^3 volume. The wound is limited to skin breakdown. There is no tunneling or undermining noted. There is a medium amount of serous drainage noted. The wound margin is flat and intact. There is large (67-100%) red granulation within the wound bed. There is a small (1-33%) amount of necrotic tissue within the wound bed including Adherent Slough. The periwound skin appearance exhibited: Maceration, Moist. The periwound skin appearance did not exhibit: Callus, Crepitus, Excoriation, Fluctuance, Friable, Induration, Localized Edema, Rash, Scarring, Dry/Scaly, Atrophie Blanche, Cyanosis, Ecchymosis, Hemosiderin Staining, Mottled, Pallor, Rubor, Erythema. Periwound temperature was noted as No Abnormality. The periwound has tenderness on palpation. Wound #2 status is Open. Original cause of wound was Gradually Appeared. The wound is located on the Right Toe Third. The wound measures 0.3cm length x 0.3cm width x 0.1cm depth; 0.071cm^2 area and 0.007cm^3 volume. The wound is limited to skin breakdown. There is no tunneling or undermining noted. There is a medium amount of serosanguineous drainage noted. The wound margin is distinct with the outline attached to the wound base. There is large (67-100%) pink, pale granulation within the wound bed. Legendre, Imanie ([id number]) There is a small (1-33%) amount of necrotic tissue within the wound bed including Adherent Slough. The periwound skin appearance exhibited:  Moist. The periwound skin appearance did not exhibit: Callus, Crepitus, Excoriation, Fluctuance, Friable, Induration, Localized Edema, Rash, Scarring, Dry/Scaly, Maceration, Atrophie Blanche, Cyanosis, Ecchymosis, Hemosiderin Staining, Mottled, Pallor, Rubor, Erythema. Periwound temperature was noted as No Abnormality. The periwound has tenderness on palpation. Wound #3 status is Open. Original cause of wound was Surgical Injury. The wound is located on the Left Amputation Site - Below Knee. The wound measures 0.8cm length x 4.3cm width x 0.4cm depth; 2.702cm^2 area and 1.081cm^3 volume. The wound is limited to skin breakdown. There is no tunneling or undermining noted. There is a small amount of serous drainage noted. The wound margin is flat and intact. There is large (67-100%) red granulation within the wound bed. There is a small (1-33%) amount of necrotic tissue within the wound bed including Adherent Slough. The periwound skin appearance did not exhibit: Callus, Crepitus, Excoriation, Fluctuance, Friable, Induration, Localized Edema, Rash, Scarring, Dry/Scaly, Maceration, Moist, Atrophie Blanche, Cyanosis, Ecchymosis, Hemosiderin Staining, Mottled, Pallor, Rubor, Erythema. The periwound has tenderness on palpation. 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 L97.812 - Non-pressure chronic ulcer of other part of right lower leg with fat layer exposed T81.31XA - Disruption of external operation (surgical) wound, not elsewhere classified, initial encounter Plan Wound Cleansing: Wound #1 Right Calcaneous: Cleanse wound with mild soap and water May Shower, gently pat wound dry prior to applying new dressing. May shower  with protection. Wound #2 Right Toe Third: Cleanse wound with mild soap and water May Shower, gently pat wound dry prior to applying new dressing. May shower with protection. Wound #3 Left Amputation Site - Below Knee: Cleanse wound with mild soap and water P[place]ka, Minie ([id number]) May Shower, gently pat wound dry prior to applying new dressing. May shower with protection. Primary Wound Dressing: Wound #  1 Right Calcaneous: Aquacel Ag Wound #2 Right Toe Third: Aquacel Ag Wound #3 Left Amputation Site - Below Knee: Iodoform packing Gauze Secondary Dressing: Wound #1 Right Calcaneous: Gauze and Kerlix/Conform XtraSorb Wound #2 Right Toe Third: Gauze and Kerlix/Conform Wound #3 Left Amputation Site - Below Knee: Gauze and Kerlix/Conform XtraSorb Dressing Change Frequency: Wound #1 Right Calcaneous: Change dressing every other day. Wound #2 Right Toe Third: Change dressing every other day. Wound #3 Left Amputation Site - Below Knee: Change dressing every other day. Follow-up Appointments: Wound #1 Right Calcaneous: Return Appointment in 1 week. Wound #2 Right Toe Third: Return Appointment in 1 week. 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 Home Health: Wound #1 Right Calcaneous: Continue Home Health Visits - [facility] Nurse may visit PRN to address patient s wound care needs. FACE TO FACE ENCOUNTER: MEDICARE and MEDICAID PATIENTS: I certify that this patient is under my care and that I had a face-to-face encounter that meets the physician face-to-face encounter requirements with this patient on this date. The encounter with the patient was in whole or in part for the following MEDICAL CONDITION: (primary reason for Home Healthcare) MEDICAL NECESSITY: I certify, that based on my findings, NURSING services are a medically necessary home health service. HOME BOUND STATUS: I certify that my clinical findings support that this  patient is homebound (i.e., Due to illness or injury, pt requires aid of supportive devices such as crutches, cane, wheelchairs, walkers, the use of special transportation or the assistance of another person to leave their place of residence. There is a normal inability to leave the home and doing so requires considerable and taxing effort. Other absences are for medical reasons / religious services and are infrequent or of short duration when for other reasons). If current dressing causes regression in wound condition, may D/C ordered dressing product/s and apply Normal Saline Moist Dressing daily until next Wound Healing Center / Other MD appointment. Notify Wound Healing Center of regression in wound condition at [phone]. Please direct any NON-WOUND related issues/requests for orders to patient's Primary Care Physician Wound #2 Right Toe ThirdJEZELLE, GULLICK ([id number]) Continue Home Health Visits - [facility] Nurse may visit PRN to address patient s wound care needs. FACE TO FACE ENCOUNTER: MEDICARE and MEDICAID PATIENTS: I certify that this patient is under my care and that I had a face-to-face encounter that meets the physician face-to-face encounter requirements with this patient on this date. The encounter with the patient was in whole or in part for the following MEDICAL CONDITION: (primary reason for Home Healthcare) MEDICAL NECESSITY: I certify, that based on my findings, NURSING services are a medically necessary home health service. HOME BOUND STATUS: I certify that my clinical findings support that this patient is homebound (i.e., Due to illness or injury, pt requires aid of supportive devices such as crutches, cane, wheelchairs, walkers, the use of special transportation or the assistance of another person to leave their place of residence. There is a normal inability to leave the home and doing so requires considerable and taxing effort. Other absences are for  medical reasons / religious services and are infrequent or of short duration when for other reasons). If current dressing causes regression in wound condition, may D/C ordered dressing product/s and apply Normal Saline Moist Dressing daily until next Wound Healing Center / Other MD appointment. Notify Wound Healing Center of regression in wound condition at [phone]. Please direct any NON-WOUND related  issues/requests for orders to patient's Primary Care Physician Wound #3 Left Amputation Site - Below Knee: Continue Home Health Visits - [facility] Nurse may visit PRN to address patient s wound care needs. FACE TO FACE ENCOUNTER: MEDICARE and MEDICAID PATIENTS: I certify that this patient is under my care and that I had a face-to-face encounter that meets the physician face-to-face encounter requirements with this patient on this date. The encounter with the patient was in whole or in part for the following MEDICAL CONDITION: (primary reason for Home Healthcare) MEDICAL NECESSITY: I certify, that based on my findings, NURSING services are a medically necessary home health service. HOME BOUND STATUS: I certify that my clinical findings support that this patient is homebound (i.e., Due to illness or injury, pt requires aid of supportive devices such as crutches, cane, wheelchairs, walkers, the use of special transportation or the assistance of another person to leave their place of residence. There is a normal inability to leave the home and doing so requires considerable and taxing effort. Other absences are for medical reasons / religious services and are infrequent or of short duration when for other reasons). If current dressing causes regression in wound condition, may D/C ordered dressing product/s and apply Normal Saline Moist Dressing daily until next Wound Healing Center / Other MD appointment. Notify Wound Healing Center of regression in wound condition at  [phone]. Please direct any NON-WOUND related issues/requests for orders to patient's Primary Care Physician Follow-Up Appointments: A follow-up appointment should be scheduled. A Patient Clinical Summary of Care was provided to HS Diabetic ulcers left stump and right leg no change. Continue alginate dressings. Electronic Signature(s) AUGUSTINE, BRANNICK ([id number]) Signed: 05/27/2015 1:33:41 PM By: Ardath Sax MD Entered By: Ardath Sax on 05/27/2015 13:33:41 [name] ([id number]) -------------------------------------------------------------------------------- SuperBill Details Patient Name: [name] Date of Service: 05/27/2015 Medical Record Number: [id number] Patient Account Number: [id number] Date of Birth/Sex: 01/11/1921 (81 y.o. 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 L97.812 Non-pressure chronic ulcer of other part of right lower leg with fat layer exposed Disruption of external operation (surgical) wound, not elsewhere classified, initial T81.31XA encounter Facility Procedures CPT4 Code: 29528413 Description: 99214 - WOUND CARE VISIT-LEV 4 EST PT Modifier: Quantity: 1 Physician Procedures CPT4 Code: 2440102 Description: 72536 - WC PHYS LEVEL 2 - EST PT ICD-10 Description Diagnosis E11.621 Type 2 diabetes mellitus with foot ulcer Modifier: Quantity: 1 Electronic Signature(s) Signed: 05/27/2015 11:05:55 AM By: Ardath Sax MD Entered By: Ardath Sax on 05/27/2015 11:05:54

## 2015-05-27 NOTE — Progress Notes (Signed)
seeiheal 

## 2015-06-03 ENCOUNTER — Encounter: Payer: Medicare Other | Attending: Surgery | Admitting: Surgery

## 2015-06-03 DIAGNOSIS — E11621 Type 2 diabetes mellitus with foot ulcer: Secondary | ICD-10-CM | POA: Insufficient documentation

## 2015-06-03 DIAGNOSIS — X58XXXD Exposure to other specified factors, subsequent encounter: Secondary | ICD-10-CM | POA: Diagnosis not present

## 2015-06-03 DIAGNOSIS — Z89512 Acquired absence of left leg below knee: Secondary | ICD-10-CM | POA: Insufficient documentation

## 2015-06-03 DIAGNOSIS — L97812 Non-pressure chronic ulcer of other part of right lower leg with fat layer exposed: Secondary | ICD-10-CM | POA: Diagnosis not present

## 2015-06-03 DIAGNOSIS — L97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: Secondary | ICD-10-CM | POA: Diagnosis present

## 2015-06-03 DIAGNOSIS — I70235 Atherosclerosis of native arteries of right leg with ulceration of other part of foot: Secondary | ICD-10-CM | POA: Insufficient documentation

## 2015-06-03 DIAGNOSIS — T8131XD Disruption of external operation (surgical) wound, not elsewhere classified, subsequent encounter: Secondary | ICD-10-CM | POA: Insufficient documentation

## 2015-06-04 NOTE — Progress Notes (Signed)
[name], [name] ([id number]) Visit Report for 06/03/2015 Chief Complaint Document Details Patient Name: [name] Date of Service: 06/03/2015 11:45 AM Medical Record Number: [id number] Patient Account Number: [id number] Date of Birth/Sex: 1921/06/27 (81 y.o. Female) Treating RN: Huel Coventry Primary Care Physician: Dorothey Baseman Other Clinician: Referring Physician: Dorothey Baseman Treating Physician/Extender: Rudene Re in Treatment: 16 Information Obtained from: Patient Chief Complaint Patient presents to the wound care center for a consult due non healing wound. 81 year old patient with comes with a history of a ulcerated area to the right third toe and the right heel which she's had for about 2 months. Electronic Signature(s) Signed: 06/03/2015 12:25:10 PM By: Evlyn Kanner MD, FACS Entered By: Evlyn Kanner on 06/03/2015 12:25:10 [name] ([id number]) -------------------------------------------------------------------------------- Debridement Details Patient Name: [name] Date of Service: 06/03/2015 11:45 AM Medical Record Number: [id number] Patient Account Number: [id number] Date of Birth/Sex: Nov 19, 1920 (81 y.o. Female) Treating RN: Huel Coventry Primary Care Physician: Terance Hart, DAVID Other Clinician: Referring Physician: Dorothey Baseman Treating Physician/Extender: Rudene Re in Treatment: 16 Debridement Performed for Wound #1 Right Calcaneous Assessment: Performed By: Physician Tristan Schroeder., MD Debridement: Debridement Pre-procedure Yes Verification/Time Out Taken: Start Time: 12:10 Pain Control: Other : lidocaine 4% Level: Skin/Subcutaneous Tissue Total Area Debrided (L x 1.5 (cm) x 1.5 (cm) = 2.25 (cm) W): Tissue and other Viable, Non-Viable, Eschar, Fibrin/Slough, Subcutaneous material debrided: Instrument: Curette Bleeding: Minimum Hemostasis Achieved: Pressure End Time: 12:21 Procedural Pain: 0 Post Procedural Pain:  0 Response to Treatment: Procedure was tolerated well Post Debridement Measurements of Total Wound Length: (cm) 1.5 Width: (cm) 1.5 Depth: (cm) 0.2 Volume: (cm) 0.353 Post Procedure Diagnosis Same as Pre-procedure Electronic Signature(s) Signed: 06/03/2015 12:25:04 PM By: Evlyn Kanner MD, FACS Signed: 06/03/2015 5:22:53 PM By: Elliot Gurney RN, BSN, Kim RN, BSN Entered By: Evlyn Kanner on 06/03/2015 12:25:04 [name] ([id number]) -------------------------------------------------------------------------------- HPI Details Patient Name: [name] Date of Service: 06/03/2015 11:45 AM Medical Record Number: [id number] Patient Account Number: [id number] Date of Birth/Sex: 01/14/1921 (81 y.o. Female) Treating RN: Huel Coventry Primary Care Physician: Dorothey Baseman Other Clinician: Referring Physician: Dorothey Baseman Treating Physician/Extender: Rudene Re in Treatment: 16 History of Present Illness Location: right medial heel and right third toe Quality: Patient reports experiencing a dull pain to affected area(s). Severity: Patient states wound are getting worse. Duration: Patient has had the wound for > 3 months prior to seeking treatment at the wound center Timing: Pain in wound is Intermittent (comes and goes Context: The wound appeared gradually over time Modifying Factors: Consults to this date include:vascular surgeon and several recent surgeries. Associated Signs and Symptoms: Patient reports having foul odor and drainage from the left below-knee amputation site. HPI Description: A pleasant 81 year old patient who is known to have diabetes mellitus for several years has recently gone through a series of operations with the vascular surgeon Dr. Gilda Crease. In January and February she had several surgeries on the left lower extremity with an attempt to have limb salvage for gangrenous changes of her forefoot. Besides the surgery she also had a transmetatarsal amputation  but ultimately she ended up with a left BKA on 01/03/2015. On 01/07/2015 she also had a right lower extremity distal runoff with a angioplasty of the right anterior tibial artery to maximize her blood flow to the foot. She recently had her staples removed at Dr. Marijean Heath office on 01/31/2015 and at that time a right lower extremity duplex was done which showed patent vessels and a patent stent with distal occluded posterior  tibial artery. Though the duplex was noncritical the recommendations from the PA at the vascular surgery office was that of a angiogram to be done but the patient said she would rather try some bone care before. The patient has received doxycycline and Cipro in the recent past and she takes oral medications for her diabetes. Other than that I reviewed her list of all her medications. No recent hemoglobin A1c has been done and no recent x-rays of the right foot have been done. 02/18/2015 -- x-ray of the right foot was done on 02/11/2015 and it shows no evidence of acute osteomyelitis of the third toe or of the calcaneus. She had gone to the vascular surgery office and they had noted that there is dehiscence of the left part of the amputation site of the below-knee wound and they have asked [place] to kindly take over the care of this. There've been doing dressings for the right heel and right third toe. 02/25/2015 -- we have received notes from the vascular group who saw her last on 02/13/2015 and she was seen by the PA Ms. Cleda Daub. She had recommended that the patient continue to follow with [place] for wound care including management of the left BKA stump, which has had dehiscence of the lateral part. They will consider repeating a arterial duplex or angiogram if the right lower extremity does not heal within a reasonable period of time. 03/18/2015 -- he saw the vascular surgeon Dr. Gilda Crease and he has set her up for an angioplasty sometime in the middle of July. she is  doing well otherwise. [name] ([id number]) 04/10/2015 -- the patient had a procedure done on 04/08/2015 and this was a right angioplasty of the dorsalis pedis, anterior tibial artery, first superficial femoral artery in its midportion. There was successful intervention with recanalization and in-line flow to the right foot. 04/22/2015 -- the patient was looking rather pale today and the daughter confirms that her hemoglobin was down to 6.9. she is being monitored by her PCP. Her Lasix dose was increased and her edema has gone down significantly. 04/29/2015 -- she had vascular studies done and the ABI on the right is 1.3 and the toe pressures were within normal limits. Her hemoglobin is still around 7 and she refuses to take any blood transfusions for religious reasons. Her potassium was normal. Electronic Signature(s) Signed: 06/03/2015 12:25:19 PM By: Evlyn Kanner MD, FACS Entered By: Evlyn Kanner on 06/03/2015 12:25:18 [name] ([id number]) -------------------------------------------------------------------------------- Physical Exam Details Patient Name: [name] Date of Service: 06/03/2015 11:45 AM Medical Record Number: [id number] Patient Account Number: [id number] Date of Birth/Sex: [birth year] (81 y.o. Female) Treating RN: Huel Coventry Primary Care Physician: Dorothey Baseman Other Clinician: Referring Physician: Dorothey Baseman Treating Physician/Extender: Rudene Re in Treatment: 16 Constitutional . Pulse regular. Respirations normal and unlabored. Afebrile. . Eyes Nonicteric. Reactive to light. Ears, Nose, Mouth, and Throat Lips, teeth, and gums WNL.Marland Kitchen Moist mucosa without lesions . Neck supple and nontender. No palpable supraclavicular or cervical adenopathy. Normal sized without goiter. Respiratory WNL. No retractions.. Cardiovascular Pedal Pulses WNL. No clubbing, cyanosis or edema. Lymphatic No adneopathy. No adenopathy. No  adenopathy. Musculoskeletal Adexa without tenderness or enlargement.. Digits and nails w/o clubbing, cyanosis, infection, petechiae, ischemia, or inflammatory conditions.. Integumentary (Hair, Skin) No suspicious lesions. No crepitus or fluctuance. No peri-wound warmth or erythema. No masses.Marland Kitchen Psychiatric Judgement and insight Intact.. No evidence of depression, anxiety, or agitation.. Notes the right third toe is completely healed. The right medial calcaneum is  looking very healthy and has minimal slough and healthy granulation tissue is predominant. The amputation stump on the left below-knee is looking very healthy and has a sliver of an opening which will continue to be packed with iodoform gauze Electronic Signature(s) Signed: 06/03/2015 12:26:21 PM By: Evlyn Kanner MD, FACS Entered By: Evlyn Kanner on 06/03/2015 12:26:20 [name] ([id number]) -------------------------------------------------------------------------------- Physician Orders Details Patient Name: [name] Date of Service: 06/03/2015 11:45 AM Medical Record Number: [id number] Patient Account Number: [id number] Date of Birth/Sex: [birth year] (81 y.o. Female) Treating RN: Huel Coventry Primary Care Physician: Dorothey Baseman Other Clinician: Referring Physician: Dorothey Baseman Treating Physician/Extender: Rudene Re in Treatment: 16 Verbal / Phone Orders: Yes Clinician: Huel Coventry Read Back and Verified: Yes Diagnosis Coding Wound Cleansing Wound #1 Right Calcaneous o Cleanse wound with mild soap and water o May Shower, gently pat wound dry prior to applying new dressing. o May shower with protection. Wound #3 Left Amputation Site - Below Knee o Cleanse wound with mild soap and water o May Shower, gently pat wound dry prior to applying new dressing. o May shower with protection. Primary Wound Dressing Wound #1 Right Calcaneous o Prisma Ag Wound #3 Left Amputation Site - Below  Knee o Iodoform packing Gauze Secondary Dressing Wound #1 Right Calcaneous o ABD pad o Gauze and Kerlix/Conform Wound #3 Left Amputation Site - Below Knee o Boardered Foam Dressing Dressing Change Frequency Wound #1 Right Calcaneous o Change dressing every other day. Wound #3 Left Amputation Site - Below Knee o Change dressing every other day. Follow-up Appointments Wound #1 Right Calcaneous o Return Appointment in 1 week. [name], [name] ([id number]) Wound #3 Left Amputation Site - Below Knee o Return Appointment in 1 week. Home Health Wound #1 Right Calcaneous o Continue Home Health Visits - [facility] o Home Health Nurse may visit PRN to address patientos wound care needs. o FACE TO FACE ENCOUNTER: MEDICARE and MEDICAID PATIENTS: I certify that this patient is under my care and that I had a face-to-face encounter that meets the physician face-to-face encounter requirements with this patient on this date. The encounter with the patient was in whole or in part for the following MEDICAL CONDITION: (primary reason for Home Healthcare) MEDICAL NECESSITY: I certify, that based on my findings, NURSING services are a medically necessary home health service. HOME BOUND STATUS: I certify that my clinical findings support that this patient is homebound (i.e., Due to illness or injury, pt requires aid of supportive devices such as crutches, cane, wheelchairs, walkers, the use of special transportation or the assistance of another person to leave their place of residence. There is a normal inability to leave the home and doing so requires considerable and taxing effort. Other absences are for medical reasons / religious services and are infrequent or of short duration when for other reasons). o If current dressing causes regression in wound condition, may D/C ordered dressing product/s and apply Normal Saline Moist Dressing daily until next Wound Healing Center / Other  MD appointment. Notify Wound Healing Center of regression in wound condition at [phone]. o Please direct any NON-WOUND related issues/requests for orders to patient's Primary Care Physician Wound #3 Left Amputation Site - Below Knee o Continue Home Health Visits - [facility] o Home Health Nurse may visit PRN to address patientos wound care needs. o FACE TO FACE ENCOUNTER: MEDICARE and MEDICAID PATIENTS: I certify that this patient is under my care and that I had a face-to-face encounter that meets the physician face-to-face  encounter requirements with this patient on this date. The encounter with the patient was in whole or in part for the following MEDICAL CONDITION: (primary reason for Home Healthcare) MEDICAL NECESSITY: I certify, that based on my findings, NURSING services are a medically necessary home health service. HOME BOUND STATUS: I certify that my clinical findings support that this patient is homebound (i.e., Due to illness or injury, pt requires aid of supportive devices such as crutches, cane, wheelchairs, walkers, the use of special transportation or the assistance of another person to leave their place of residence. There is a normal inability to leave the home and doing so requires considerable and taxing effort. Other absences are for medical reasons / religious services and are infrequent or of short duration when for other reasons). o If current dressing causes regression in wound condition, may D/C ordered dressing product/s and apply Normal Saline Moist Dressing daily until next Wound Healing Center / Other MD appointment. Notify Wound Healing Center of regression in wound condition at [phone]. o Please direct any NON-WOUND related issues/requests for orders to patient's Primary Care Physician Electronic Springer) [name], [name] ([id number]) Signed: 06/03/2015 4:40:03 PM By: Evlyn Kanner MD, FACS Signed: 06/03/2015 5:22:53 PM By: Elliot Gurney RN, BSN,  Kim RN, BSN Entered By: Elliot Gurney, RN, BSN, Kim on 06/03/2015 12:13:11 [name] ([id number]) -------------------------------------------------------------------------------- Problem List Details Patient Name: [name] Date of Service: 06/03/2015 11:45 AM Medical Record Number: [id number] Patient Account Number: [id number] Date of Birth/Sex: 1921/05/24 (81 y.o. 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 with 02/11/2015 Yes fat layer exposed L97.812 Non-pressure chronic ulcer of other part of right lower leg 02/11/2015 Yes with fat layer exposed T81.31XA Disruption of external operation (surgical) wound, not 02/18/2015 Yes elsewhere classified, initial encounter Inactive Problems Resolved Problems Electronic Signature(s) Signed: 06/03/2015 12:24:54 PM By: Evlyn Kanner MD, FACS Entered By: Evlyn Kanner on 06/03/2015 12:24:54 [name] ([id number]) -------------------------------------------------------------------------------- Progress Note Details Patient Name: [name] Date of Service: 06/03/2015 11:45 AM Medical Record Number: [id number] Patient Account Number: [id number] Date of Birth/Sex: 1921/08/27 (81 y.o. Female) Treating RN: Huel Coventry Primary Care Physician: Dorothey Baseman Other Clinician: Referring Physician: Dorothey Baseman Treating Physician/Extender: Rudene Re in Treatment: 16 Subjective Chief Complaint Information obtained from  Patient Patient presents to the wound care center for a consult due non healing wound. 81 year old patient with comes with a history of a ulcerated area to the right third toe and the right heel which she's had for about 2 months. History of Present Illness (HPI) The following HPI elements were documented for the patient's wound: Location: right medial heel and right third toe Quality: Patient reports experiencing a dull pain to affected area(s). Severity: Patient states wound are getting worse. Duration: Patient has had the wound for > 3 months prior to seeking treatment at the wound center Timing: Pain in wound is Intermittent (comes and goes Context: The wound appeared gradually over time Modifying Factors: Consults to this date include:vascular surgeon and several recent surgeries. Associated Signs and Symptoms: Patient reports having foul odor and drainage from the left below-knee amputation site.  A pleasant 81 year old patient who is known to have diabetes mellitus for several years has recently gone through a series of operations with the vascular surgeon Dr. Gilda Crease. In January and February she had several surgeries on the left lower extremity with an attempt to have limb salvage for gangrenous changes of her forefoot. 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 On 01/07/2015 she also had a right lower extremity distal runoff with a angioplasty of the right anterior tibial artery to maximize her blood flow to the foot. She recently had her staples removed at Dr. Marijean Heath office on 01/31/2015 and at that time a right lower extremity duplex was done which showed patent vessels and a patent stent with distal occluded posterior tibial artery. Though the duplex was noncritical the recommendations from the PA at the vascular surgery office was that of a angiogram to be done but the patient said she would rather  try some bone care before. The patient has received doxycycline and Cipro in the recent past and she takes oral medications for her diabetes. Other than that I reviewed her list of all her medications. No recent hemoglobin A1c has been done and no recent x-rays of the right foot have been done. 02/18/2015 -- x-ray of the right foot was done on 02/11/2015 and it shows no evidence of acute osteomyelitis of the third toe or of the calcaneus. She had gone to the vascular surgery office and they had noted that there is dehiscence of the left part of the amputation site of the below-knee wound and they have asked [place] to kindly take over the care of this. There've been doing dressings for the right heel and right third toe. [name], [name] ([id number]) 02/25/2015 -- we have received notes from the vascular group who saw her last on 02/13/2015 and she was seen by the PA Ms. Cleda Daub. She had recommended that the patient continue to follow with [place] for wound care including management of the left BKA stump, which has had dehiscence of the lateral part. They will consider repeating a arterial duplex or angiogram if the right lower extremity does not heal within a reasonable period of time. 03/18/2015 -- he saw the vascular surgeon Dr. Gilda Crease and he has set her up for an angioplasty sometime in the middle of July. she is doing well otherwise. 04/10/2015 -- the patient had a procedure done on 04/08/2015 and this was a right angioplasty of the dorsalis pedis, anterior tibial artery, first superficial femoral artery in its midportion. There was successful intervention with recanalization and in-line flow to the right foot. 04/22/2015 -- the patient was looking rather pale today and the daughter confirms that her hemoglobin was down to 6.9. she is being monitored by her PCP. Her Lasix dose was increased and her edema has gone down significantly. 04/29/2015 -- she had vascular studies done and the ABI  on the right is 1.3 and the toe pressures were within normal limits. Her hemoglobin is still around 7 and she refuses to take any blood transfusions for religious reasons. Her potassium was normal. Objective Constitutional Pulse regular. Respirations normal and unlabored. Afebrile. Vitals Time Taken: 11:48 AM, Height: 62 in, Weight: 155 lbs, BMI: 28.3, Temperature: 97.7 F, Pulse: 49 bpm, Respiratory Rate: 18 breaths/min, Blood Pressure: 125/49 mmHg. Eyes Nonicteric. Reactive to light. Ears, Nose, Mouth, and Throat Lips, teeth, and gums WNL.Marland Kitchen Moist mucosa without lesions . Neck supple and nontender.  No palpable supraclavicular or cervical adenopathy. Normal sized without goiter. Respiratory WNL. No retractions.. Cardiovascular Pedal Pulses WNL. No clubbing, cyanosis or edema. [name], [name] ([id number]) Lymphatic No adneopathy. No adenopathy. No adenopathy. Musculoskeletal Adexa without tenderness or enlargement.. Digits and nails w/o clubbing, cyanosis, infection, petechiae, ischemia, or inflammatory conditions.Marland Kitchen Psychiatric Judgement and insight Intact.. No evidence of depression, anxiety, or agitation.. General Notes: the right third toe is completely healed. The right medial calcaneum is looking very healthy and has minimal slough and healthy granulation tissue is predominant. The amputation stump on the left below-knee is looking very healthy and has a sliver of an opening which will continue to be packed with iodoform gauze Integumentary (Hair, Skin) No suspicious lesions. No crepitus or fluctuance. No peri-wound warmth or erythema. No masses.. Wound #1 status is Open. Original cause of wound was Gradually Appeared. The wound is located on the Right Calcaneous. The wound measures 1.5cm length x 1.5cm width x 0.2cm depth; 1.767cm^2 area and 0.353cm^3 volume. The wound is limited to skin breakdown. There is a medium amount of serous drainage noted. The wound margin is flat and  intact. There is large (67-100%) red granulation within the wound bed. There is a small (1-33%) amount of necrotic tissue within the wound bed including Adherent Slough. The periwound skin appearance exhibited: Moist. The periwound skin appearance did not exhibit: Callus, Crepitus, Excoriation, Fluctuance, Friable, Induration, Localized Edema, Rash, Scarring, Dry/Scaly, Maceration, Atrophie Blanche, Cyanosis, Ecchymosis, Hemosiderin Staining, Mottled, Pallor, Rubor, Erythema. Periwound temperature was noted as No Abnormality. The periwound has tenderness on palpation. Wound #2 status is Healed - Epithelialized. Original cause of wound was Gradually Appeared. The wound is located on the Right Toe Third. The wound measures 0cm length x 0cm width x 0cm depth; 0cm^2 area and 0cm^3 volume. The wound is limited to skin breakdown. There is a medium amount of serosanguineous drainage noted. The wound margin is distinct with the outline attached to the wound base. There is small (1-33%) pink, pale granulation within the wound bed. There is a large (67-100%) amount of necrotic tissue within the wound bed including Adherent Slough. The periwound skin appearance exhibited: Moist. The periwound skin appearance did not exhibit: Callus, Crepitus, Excoriation, Fluctuance, Friable, Induration, Localized Edema, Rash, Scarring, Dry/Scaly, Maceration, Atrophie Blanche, Cyanosis, Ecchymosis, Hemosiderin Staining, Mottled, Pallor, Rubor, Erythema. Periwound temperature was noted as No Abnormality. The periwound has tenderness on palpation. Wound #3 status is Open. Original cause of wound was Surgical Injury. The wound is located on the Left Amputation Site - Below Knee. The wound measures 0.1cm length x 1.1cm width x 0.3cm depth; 0.086cm^2 area and 0.026cm^3 volume. The wound is limited to skin breakdown. There is a small amount of serous drainage noted. The wound margin is flat and intact. There is large (67-100%)  red granulation within the wound bed. There is a small (1-33%) amount of necrotic tissue within the wound bed including Adherent Slough. The periwound skin appearance did not exhibit: Callus, Crepitus, Excoriation, Fluctuance, Friable, Induration, Localized Edema, Rash, Scarring, Dry/Scaly, Maceration, Moist, Atrophie Blanche, Cyanosis, Ecchymosis, Hemosiderin Staining, Mottled, Pallor, Rubor, Erythema. The periwound has tenderness on palpation. 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 We will use iodoform gauze in the area to be packed on the left BKA stump and Prisma AG on the right calcaneum. Her progress has been very good and we will see her back next week. 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 There was a Skin/Subcutaneous Tissue Debridement ([id number]) debridement with total area of 2.25 sq cm performed by Tristan Schroeder., MD. with the following instrument(s): Curette to remove Viable and Non-Viable tissue/material including Fibrin/Slough, Eschar, and Subcutaneous after achieving pain control using Other (lidocaine 4%). A time out was conducted prior to the start of the procedure. A Minimum amount of bleeding was controlled with Pressure. The procedure was tolerated well with a pain level of 0 throughout and a pain level of 0 following the procedure. Post Debridement Measurements:  1.5cm length x 1.5cm width x 0.2cm depth; 0.353cm^3 volume. Post procedure Diagnosis Wound #1: Same as Pre-Procedure Plan Wound Cleansing: Wound #1 Right Calcaneous: Cleanse wound with mild soap and water May Shower, gently pat wound dry prior to applying new dressing. May shower with protection. [name], [name] ([id number]) Wound #3 Left Amputation Site - Below Knee: Cleanse wound with mild soap and water May Shower, gently pat wound dry prior to applying new dressing. May shower with protection. Primary Wound Dressing: Wound #1 Right Calcaneous: Prisma Ag Wound #3 Left Amputation Site - Below Knee: Iodoform packing Gauze Secondary Dressing: Wound #1 Right Calcaneous: ABD pad Gauze and Kerlix/Conform Wound #3 Left Amputation Site - Below Knee: Boardered Foam Dressing Dressing Change Frequency: Wound #1 Right Calcaneous: Change dressing every other day. Wound #3 Left Amputation Site - Below Knee: Change dressing every other day. Follow-up Appointments: Wound #1 Right Calcaneous: Return Appointment in 1 week. 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 Home Health: Wound #1 Right Calcaneous: Continue Home Health Visits - [facility] Nurse may visit PRN to address patient s wound care needs. FACE TO FACE ENCOUNTER: MEDICARE and MEDICAID PATIENTS: I certify that this patient is under my care and that I had a face-to-face encounter that meets the physician face-to-face encounter requirements with this patient on this date. The encounter with the patient was in whole or in part for the following MEDICAL CONDITION: (primary reason for Home Healthcare) MEDICAL NECESSITY: I certify, that based on my findings, NURSING services are a medically necessary home health service. HOME BOUND STATUS: I certify that my clinical findings support that this patient is homebound (i.e., Due to illness or injury, pt requires aid of supportive devices such as  crutches, cane, wheelchairs, walkers, the use of special transportation or the assistance of another person to leave their place of residence. There is a normal inability to leave the home and doing so requires considerable and taxing effort. Other absences are for medical reasons / religious services and are infrequent or of short duration when for other reasons). If current dressing causes regression in wound condition, may D/C ordered dressing product/s and apply Normal Saline Moist Dressing daily until next Wound Healing Center / Other MD appointment. Notify Wound Healing Center of regression in wound condition at [phone]. Please direct any NON-WOUND related issues/requests for orders to patient's Primary Care Physician Wound #3 Left Amputation Site - Below Knee: Continue Home Health Visits - [facility]  Nurse may visit PRN to address patient s wound care needs. FACE TO FACE ENCOUNTER: MEDICARE and MEDICAID PATIENTS: I certify that this patient is under my care and that I had a face-to-face encounter that meets the physician face-to-face encounter requirements with this patient on this date. The encounter with the patient was in whole or in part for the following MEDICAL CONDITION: (primary reason for Home Healthcare) MEDICAL NECESSITY: I certify, that based on my findings, NURSING services are a medically necessary home health service. HOME [name], [name] ([id number]) BOUND STATUS: I certify that my clinical findings support that this patient is homebound (i.e., Due to illness or injury, pt requires aid of supportive devices such as crutches, cane, wheelchairs, walkers, the use of special transportation or the assistance of another person to leave their place of residence. There is a normal inability to leave the home and doing so requires considerable and taxing effort. Other absences are for medical reasons / religious services and are infrequent or of short duration when  for other reasons). If current dressing causes regression in wound condition, may D/C ordered dressing product/s and apply Normal Saline Moist Dressing daily until next Wound Healing Center / Other MD appointment. Notify Wound Healing Center of regression in wound condition at [phone]. Please direct any NON-WOUND related issues/requests for orders to patient's Primary Care Physician The overall improvement has been excellent. We will use iodoform gauze in the area to be packed on the left BKA stump and Prisma AG on the right calcaneum. Her progress has been very good and we will see her back next week. Electronic Signature(s) Signed: 06/03/2015 12:27:29 PM By: Evlyn Kanner MD, FACS Previous Signature: 06/03/2015 12:27:19 PM Version By: Evlyn Kanner MD, FACS Entered By: Evlyn Kanner on 06/03/2015 12:27:29 [name] ([id number]) -------------------------------------------------------------------------------- SuperBill Details Patient Name: [name] Date of Service: 06/03/2015 Medical Record Number: [id number] Patient Account Number: [id number] Date of Birth/Sex: Apr 01, 1921 (81 y.o. 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 L97.812 Non-pressure chronic ulcer of other part of right lower leg with fat layer exposed Disruption of external operation (surgical) wound, not elsewhere classified, initial T81.31XA encounter Facility Procedures CPT4: Description Modifier Quantity Code [id number] 11042 - DEB SUBQ TISSUE  20 SQ CM/< 1 ICD-10 Description Diagnosis E11.621 Type 2 diabetes mellitus with foot ulcer I70.235 Atherosclerosis of native arteries of right leg with ulceration of other part of  foot 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 L97.812 Non-pressure chronic ulcer of other part of right lower leg with fat layer exposed Physician Procedures CPT4: Description Modifier Quantity Code [id number] 11042 - WC PHYS SUBQ TISS 20 SQ CM 1 ICD-10 Description Diagnosis E11.621 Type 2 diabetes mellitus with foot ulcer I70.235 Atherosclerosis of native arteries of right leg with ulceration of other part of  foot 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

## 2015-06-04 NOTE — Progress Notes (Signed)
[name], [name] ([id number]) Visit Report for 06/03/2015 Arrival Information Details Patient Name: [name], [name] Date of Service: 06/03/2015 11:45 AM Medical Record Number: [id number] Patient Account Number: [id number] Date of Birth/Sex: [birth year] (81 y.o. Female) Treating RN: Huel Coventry Primary Care Physician: Dorothey Baseman Other Clinician: Referring Physician: Dorothey Baseman Treating Physician/Extender: Rudene Re in Treatment: 16 Visit Information History Since Last Visit Added or deleted any medications: No Patient Arrived: Wheel Chair Any new allergies or adverse reactions: No Arrival Time: 11:45 Had a fall or experienced change in No activities of daily living that may affect Accompanied By: daughter risk of falls: Transfer Assistance: Manual Signs or symptoms of abuse/neglect since last No Patient Identification Verified: Yes visito Secondary Verification Process Yes Hospitalized since last visit: No Completed: Has Dressing in Place as Prescribed: Yes Patient Has Alerts: Yes Pain Present Now: No Patient Alerts: DMII Electronic Signature(s) Signed: 06/03/2015 5:22:53 PM By: Elliot Gurney, RN, BSN, Kim RN, BSN Entered By: Elliot Gurney, RN, BSN, Kim on 06/03/2015 11:47:53 [name] ([id number]) -------------------------------------------------------------------------------- Encounter Discharge Information Details Patient Name: [name] Date of Service: 06/03/2015 11:45 AM Medical Record Number: [id number] Patient Account Number: [id number] Date of Birth/Sex: 11/08/1920 (81 y.o. Female) Treating RN: Huel Coventry Primary Care Physician: Dorothey Baseman Other Clinician: Referring Physician: Dorothey Baseman Treating Physician/Extender: Rudene Re in Treatment: 16 Encounter Discharge Information Items Discharge Pain Level: 0 Discharge Condition: Stable Ambulatory Status: Wheelchair Discharge Destination: Home Transportation: Private Auto Accompanied By:  self Schedule Follow-up Appointment: Yes Medication Reconciliation completed Yes and provided to Patient/Care Bryar Dahms: Provided on Clinical Summary of Care: 06/03/2015 Form Type Recipient Paper Patient HS Electronic Signature(s) Signed: 06/03/2015 12:23:26 PM By: Gwenlyn Perking Entered By: Gwenlyn Perking on 06/03/2015 12:23:26 [name] ([id number]) -------------------------------------------------------------------------------- Lower Extremity Assessment Details Patient Name: [name] Date of Service: 06/03/2015 11:45 AM Medical Record Number: [id number] Patient Account Number: [id number] Date of Birth/Sex: [birth year] (81 y.o. Female) Treating RN: Huel Coventry Primary Care Physician: Dorothey Baseman Other Clinician: Referring Physician: Dorothey Baseman Treating Physician/Extender: Rudene Re in Treatment: 16 Vascular Assessment Pulses: Posterior Tibial Dorsalis Pedis Palpable: [Left:No] Doppler: [Left:Monophasic] Extremity colors, hair growth, and conditions: Extremity Color: [Left:Red] Hair Growth on Extremity: [Left:Yes] Temperature of Extremity: [Left:Warm] Capillary Refill: [Left:> 3 seconds] Toe Nail Assessment Left: Right: Thick: Yes Discolored: Yes Deformed: Yes Improper Length and Hygiene: Yes Electronic Signature(s) Signed: 06/03/2015 5:22:53 PM By: Elliot Gurney, RN, BSN, Kim RN, BSN Entered By: Elliot Gurney, RN, BSN, Kim on 06/03/2015 11:56:51 [name] ([id number]) -------------------------------------------------------------------------------- Multi Wound Chart Details Patient Name: [name] Date of Service: 06/03/2015 11:45 AM Medical Record Number: [id number] Patient Account Number: [id number] Date of Birth/Sex: Feb 11, 1921 (81 y.o. 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] Comorbid History: [1:Arrhythmia, Deep Vein Thrombosis, Peripheral Arterial Disease, Type II Arterial Disease, Type II Arterial Disease, Type II Diabetes] [2:Arrhythmia, Deep Vein Thrombosis, Peripheral Diabetes] [3:Arrhythmia, Deep Vein Thrombosis,  Peripheral Diabetes] Date Acquired: [1:11/04/2014] [2:11/04/2014] [3:01/03/2015] Weeks of Treatment: [1:16] [2:16] [3:15] Wound Status: [1:Open] [2:Open] [3:Open] Measurements L x W x D 1.5x1.5x0.2 [2:0.4x0.5x0.1] [3:0.1x1.1x0.3] (cm) Area (cm) : [  1:1.767] [2:0.157] [3:0.086] Volume (cm) : [1:0.353] [2:0.016] [3:0.026] % Reduction in Area: [1:67.90%] [2:83.30%] [3:99.20%] % Reduction in Volume: 35.80% [2:83.00%] [3:99.40%] Classification: [1:Full Thickness Without Exposed Support Structures] [2:Full Thickness Without Exposed Support Structures] [3:Full Thickness Without Exposed Support Structures] HBO Classification: [1:Grade 1] [2:Grade 1] [3:Grade 1] Exudate Amount: [1:Medium] [2:Medium] [3:Small] Exudate Type: [1:Serous] [2:Serosanguineous] [3:Serous] Exudate Color: [1:amber] [2:red, brown] [3:amber] Wound Margin: [1:Flat and Intact] [2:Distinct, outline attached Flat and Intact] Granulation Amount: [1:Large (67-100%)] [2:Small (1-33%)] [3:Large (67-100%)] Granulation Quality: [1:Red] [2:Pink, Pale] [3:Red] Necrotic Amount: [1:Small (1-33%)] [2:Large (67-100%)] [3:Small (1-33%)] Exposed Structures: Broecker, Jashley ([id number]) Fascia: No Fascia:  No Fascia: No Fat: No Fat: No Fat: No Tendon: No Tendon: No Tendon: No Muscle: No Muscle: No Muscle: No Joint: No Joint: No Joint: No Bone: No Bone: No Bone: No Limited to Skin Limited to Skin Limited to Skin Breakdown Breakdown Breakdown Epithelialization: None Small (1-33%) None Periwound Skin Texture: Edema: No Edema: No Edema: No Excoriation: No Excoriation: No Excoriation: No Induration: No Induration: No Induration: No Callus: No Callus: No Callus: No Crepitus: No Crepitus: No Crepitus: No Fluctuance: No Fluctuance: No Fluctuance: No Friable: No Friable: No Friable: No Rash: No Rash: No Rash: No Scarring: No Scarring: No Scarring: No Periwound Skin Moist: Yes Moist: Yes Maceration: No Moisture: Maceration: No Maceration: No Moist: No Dry/Scaly: No Dry/Scaly: No Dry/Scaly: No Periwound Skin Color: Atrophie Blanche: No Atrophie Blanche: No Atrophie Blanche: No Cyanosis: No Cyanosis: No Cyanosis: No Ecchymosis: No Ecchymosis: No Ecchymosis: No Erythema: No Erythema: No Erythema: No Hemosiderin Staining: No Hemosiderin Staining: No Hemosiderin Staining: No Mottled: No Mottled: No Mottled: No Pallor: No Pallor: No Pallor: No Rubor: No Rubor: No Rubor: No Temperature: No Abnormality No Abnormality N/A Tenderness on Yes Yes Yes Palpation: Wound Preparation: Ulcer Cleansing: Ulcer Cleansing: Ulcer Cleansing: Rinsed/Irrigated with Rinsed/Irrigated with Rinsed/Irrigated with Saline Saline Saline Topical Anesthetic Topical Anesthetic Topical Anesthetic Applied: Other: lidocaine Applied: Other: idocaine Applied: Other: lidocaine 4% 4% 4% Treatment Notes Electronic Signature(s) Signed: 06/03/2015 5:22:53 PM By: Elliot Gurney, RN, BSN, Kim RN, BSN Entered By: Elliot Gurney, RN, BSN, Kim on 06/03/2015 11:58:59 [name] ([id number]) -------------------------------------------------------------------------------- Multi-Disciplinary Care Plan  Details Patient Name: [name] Date of Service: 06/03/2015 11:45 AM Medical Record Number: [id number] Patient Account Number: [id number] Date of Birth/Sex: 1921-07-03 (81 y.o. Female) Treating RN: Huel Coventry Primary Care Physician: Dorothey Baseman Other Clinician: Referring Physician: Dorothey Baseman Treating Physician/Extender: Rudene Re in Treatment: 16 Active Inactive Abuse / Safety / Falls / Self Care Management Nursing Diagnoses: Impaired physical mobility Potential for falls Goals: Patient will remain injury free Date Initiated: 02/11/2015 Goal Status: Active Patient/caregiver will verbalize understanding of skin care regimen Date Initiated: 02/11/2015 Goal Status: Active Patient/caregiver will verbalize/demonstrate measures taken to prevent injury and/or falls Date Initiated: 02/11/2015 Goal Status: Active Patient/caregiver will verbalize/demonstrate understanding of what to do in case of emergency Date Initiated: 02/11/2015 Goal Status: Active Interventions: Assess fall risk on admission and as needed Provide education on fall prevention Provide education on safe transfers Treatment Activities: Patient referred to home care : 06/03/2015 Notes: Nutrition Nursing Diagnoses: Imbalanced nutrition Potential for alteratiion in Nutrition/Potential for imbalanced nutrition [name], [name] ([id number]) Goals: Patient/caregiver verbalizes understanding of need to maintain therapeutic glucose control per primary care physician Date Initiated: 02/11/2015 Goal Status: Active Patient/caregiver will maintain therapeutic glucose control Date Initiated: 02/11/2015 Goal Status: Active Interventions: Assess HgA1c results as ordered upon admission and as needed Provide education on elevated blood sugars and impact on wound healing  Provide education on nutrition Treatment Activities: Education provided on Nutrition : 04/22/2015 Obtain HgA1c : 06/03/2015 Notes: Wound/Skin  Impairment Nursing Diagnoses: Impaired tissue integrity Knowledge deficit related to ulceration/compromised skin integrity Goals: Patient/caregiver will verbalize understanding of skin care regimen Date Initiated: 02/11/2015 Goal Status: Active Ulcer/skin breakdown will heal within 14 weeks Date Initiated: 02/11/2015 Goal Status: Active Interventions: Assess patient/caregiver ability to obtain necessary supplies Assess patient/caregiver ability to perform ulcer/skin care regimen upon admission and as needed Assess ulceration(s) every visit Provide education on ulcer and skin care Treatment Activities: Skin care regimen initiated : 06/03/2015 Topical wound management initiated : 06/03/2015 Notes: [name], [name] ([id number]) Electronic Signature(s) Signed: 06/03/2015 5:22:53 PM By: Elliot Gurney, RN, BSN, Kim RN, BSN Entered By: Elliot Gurney, RN, BSN, Kim on 06/03/2015 11:58:53 [name] ([id number]) -------------------------------------------------------------------------------- Patient/Caregiver Education Details Patient Name: [name] Date of Service: 06/03/2015 11:45 AM Medical Record Number: [id number] Patient Account Number: [id number] Date of Birth/Gender: 08-26-1921 (81 y.o. Female) Treating RN: Huel Coventry Primary Care Physician: Dorothey Baseman Other Clinician: Referring Physician: Dorothey Baseman Treating Physician/Extender: Rudene Re in Treatment: 16 Education Assessment Education Provided To: Patient and Caregiver Education Topics Provided Wound/Skin Impairment: Handouts: Caring for Your Ulcer, Other: continue wound care as prescribed Methods: Demonstration, Explain/Verbal Responses: State content correctly Electronic Signature(s) Signed: 06/03/2015 5:22:53 PM By: Elliot Gurney, RN, BSN, Kim RN, BSN Entered By: Elliot Gurney, RN, BSN, Kim on 06/03/2015 12:23:39 [name] ([id number]) -------------------------------------------------------------------------------- Wound  Assessment Details Patient Name: [name] Date of Service: 06/03/2015 11:45 AM Medical Record Number: [id number] Patient Account Number: [id number] Date of Birth/Sex: [birth year] (81 y.o. 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 Wound Open Wounding Event: Gradually Appeared Status: Date Acquired: 11/04/2014 Comorbid Arrhythmia, Deep Vein Thrombosis, Weeks Of Treatment: 16 History: Peripheral Arterial Disease, Type II Clustered Wound: No Diabetes Photos Photo Uploaded By: Elliot Gurney, RN, BSN, Kim on 06/03/2015 16:29:02 Wound Measurements Length: (cm) 1.5 Width: (cm) 1.5 Depth: (cm) 0.2 Area: (cm) 1.767 Volume: (cm) 0.353 % Reduction in Area: 67.9% % Reduction in Volume: 35.8% Epithelialization: None Wound Description Full Thickness Without Foul Odor Aft Classification: Exposed Support Structures Diabetic Severity Grade 1 (Wagner): Wound Margin: Flat and Intact Exudate Amount: Medium Exudate Type: Serous Exudate Color: amber er Cleansing: No Wound Bed Granulation Amount: Large (67-100%) Exposed Structure Mcalpine, Ernestine ([id number]) Granulation Quality: Red Fascia Exposed: No Necrotic Amount: Small (1-33%) Fat Layer Exposed: No Necrotic Quality: Adherent Slough Tendon Exposed: No Muscle Exposed: No Joint Exposed: No Bone Exposed: No Limited to Skin Breakdown Periwound Skin Texture Texture Color No Abnormalities Noted: No No Abnormalities Noted: No Callus: No Atrophie Blanche: No Crepitus: No Cyanosis: No Excoriation: No Ecchymosis: No Fluctuance: No Erythema: No Friable: No Hemosiderin Staining: No Induration: No Mottled: No Localized Edema: No Pallor: No Rash: No Rubor: No Scarring:  No Temperature / Pain Moisture Temperature: No Abnormality No Abnormalities Noted: No Tenderness on Palpation: Yes Dry / Scaly: No Maceration: No Moist: Yes Wound Preparation Ulcer Cleansing: Rinsed/Irrigated with Saline Topical Anesthetic Applied: Other: lidocaine 4%, Treatment Notes Wound #1 (Right Calcaneous) 1. Cleansed with: Clean wound with Normal Saline 4. Dressing Applied: Prisma Ag 5. Secondary Dressing Applied ABD and Kerlix/Conform Electronic Signature(s) Signed: 06/03/2015 5:22:53 PM By: Elliot Gurney, RN, BSN, Kim RN, BSN Entered By: Elliot Gurney, RN, BSN, Kim on 06/03/2015 11:57:25 [name] ([id number]) -------------------------------------------------------------------------------- Wound Assessment Details  Patient Name: [name], [name] Date of Service: 06/03/2015 11:45 AM Medical Record Number: [id number] Patient Account Number: [id number] Date of Birth/Sex: Feb 18, 1921 (81 y.o. Female) Treating RN: Huel Coventry Primary Care Physician: Terance Hart, DAVID Other Clinician: Referring Physician: Dorothey Baseman Treating Physician/Extender: Rudene Re in Treatment: 16 Wound Status Wound Number: 2 Primary Arterial Insufficiency Ulcer Etiology: Wound Location: Right Toe Third Wound Healed - Epithelialized Wounding Event: Gradually Appeared Status: Date Acquired: 11/04/2014 Comorbid Arrhythmia, Deep Vein Thrombosis, Weeks Of Treatment: 16 History: Peripheral Arterial Disease, Type II Clustered Wound: No Diabetes Photos Photo Uploaded By: Elliot Gurney, RN, BSN, Kim on 06/03/2015 16:29:02 Wound Measurements Length: (cm) 0 % Reduction Width: (cm) 0 % Reduction Depth: (cm) 0 Epithelializ Area: (cm) 0 Volume: (cm) 0 in Area: 100% in Volume: 100% ation: Small (1-33%) Wound Description Full Thickness Without Exposed Classification: Support Structures Diabetic Severity Grade 1 (Wagner): Wound Margin: Distinct, outline attached Exudate Amount: Medium Exudate Type:  Serosanguineous Exudate Color: red, brown Foul Odor After Cleansing: No Wound Bed Granulation Amount: Small (1-33%) Exposed Structure Derner, Nashayla ([id number]) Granulation Quality: Pink, Pale Fascia Exposed: No Necrotic Amount: Large (67-100%) Fat Layer Exposed: No Necrotic Quality: Adherent Slough Tendon Exposed: No Muscle Exposed: No Joint Exposed: No Bone Exposed: No Limited to Skin Breakdown Periwound Skin Texture Texture Color No Abnormalities Noted: No No Abnormalities Noted: No Callus: No Atrophie Blanche: No Crepitus: No Cyanosis: No Excoriation: No Ecchymosis: No Fluctuance: No Erythema: No Friable: No Hemosiderin Staining: No Induration: No Mottled: No Localized Edema: No Pallor: No Rash: No Rubor: No Scarring: No Temperature / Pain Moisture Temperature: No Abnormality No Abnormalities Noted: No Tenderness on Palpation: Yes Dry / Scaly: No Maceration: No Moist: Yes Wound Preparation Ulcer Cleansing: Rinsed/Irrigated with Saline Topical Anesthetic Applied: Other: idocaine 4%, Electronic Signature(s) Signed: 06/03/2015 5:22:53 PM By: Elliot Gurney, RN, BSN, Kim RN, BSN Entered By: Elliot Gurney, RN, BSN, Kim on 06/03/2015 12:07:34 [name] ([id number]) -------------------------------------------------------------------------------- Wound Assessment Details Patient Name: [name] Date of Service: 06/03/2015 11:45 AM Medical Record Number: [id number] Patient Account Number: [id number] Date of Birth/Sex: 05-07-1921 (81 y.o. Female) Treating RN: Huel Coventry Primary Care Physician: Dorothey Baseman Other Clinician: Referring Physician: Terance Hart, DAVID Treating Physician/Extender: Rudene Re in Treatment: 16 Wound Status Wound Number: 3 Primary Dehisced Wound Etiology: Wound Location: Left Amputation Site - Below Knee Wound Open Status: Wounding Event: Surgical Injury Comorbid Arrhythmia, Deep Vein Thrombosis, Date Acquired: 01/03/2015 History:  Peripheral Arterial Disease, Type II Weeks Of Treatment: 15 Diabetes Clustered Wound: No Photos Photo Uploaded By: Elliot Gurney, RN, BSN, Kim on 06/03/2015 16:29:16 Wound Measurements Length: (cm) 0.1 Width: (cm) 1.1 Depth: (cm) 0.3 Area: (cm) 0.086 Volume: (cm) 0.026 % Reduction in Area: 99.2% % Reduction in Volume: 99.4% Epithelialization: None Wound Description Full Thickness Without Classification: Exposed Support Structures Diabetic Severity Grade 1 (Wagner): Wound Margin: Flat and Intact Exudate Amount: Small Exudate Type: Serous Exudate Color: amber Foul Odor After Cleansing: No Wound Bed Granulation Amount: Large (67-100%) Exposed Structure Hemmelgarn, Abria ([id number]) Granulation Quality: Red Fascia Exposed: No Necrotic Amount: Small (1-33%) Fat Layer Exposed: No Necrotic Quality: Adherent Slough Tendon Exposed: No Muscle Exposed: No Joint Exposed: No Bone Exposed: No Limited to Skin Breakdown Periwound Skin Texture Texture Color No Abnormalities Noted: No No Abnormalities Noted: No Callus: No Atrophie Blanche: No Crepitus: No Cyanosis: No Excoriation: No Ecchymosis: No Fluctuance: No Erythema: No Friable: No Hemosiderin Staining: No Induration: No Mottled: No Localized Edema: No Pallor: No Rash: No Rubor: No Scarring: No Temperature / Pain Moisture Tenderness  on Palpation: Yes No Abnormalities Noted: No Dry / Scaly: No Maceration: No Moist: No Wound Preparation Ulcer Cleansing: Rinsed/Irrigated with Saline Topical Anesthetic Applied: Other: lidocaine 4%, Treatment Notes Wound #3 (Left Amputation Site - Below Knee) 1. Cleansed with: Clean wound with Normal Saline 2. Anesthetic Topical Lidocaine 4% cream to wound bed prior to debridement 4. Dressing Applied: Iodoform packing Gauze 5. Secondary Dressing Applied Bordered Foam Dressing Electronic Signature(s) Signed: 06/03/2015 5:22:53 PM By: Elliot Gurney, RN, BSN, Kim RN, BSN Entered By:  Elliot Gurney, RN, BSN, Kim on 06/03/2015 11:58:41 [name] ([id number]) -------------------------------------------------------------------------------- Vitals Details Patient Name: [name] Date of Service: 06/03/2015 11:45 AM Medical Record Number: [id number] Patient Account Number: [id number] Date of Birth/Sex: 1920-10-07 (81 y.o. Female) Treating RN: Huel Coventry Primary Care Physician: Dorothey Baseman Other Clinician: Referring Physician: Dorothey Baseman Treating Physician/Extender: Rudene Re in Treatment: 16 Vital Signs Time Taken: 11:48 Temperature (F): 97.7 Height (in): 62 Pulse (bpm): 49 Weight (lbs): 155 Respiratory Rate (breaths/min): 18 Body Mass Index (BMI): 28.3 Blood Pressure (mmHg): 125/49 Reference Range: 80 - 120 mg / dl Electronic Signature(s) Signed: 06/03/2015 5:22:53 PM By: Elliot Gurney, RN, BSN, Kim RN, BSN Entered By: Elliot Gurney, RN, BSN, Kim on 06/03/2015 11:49:04

## 2015-06-05 ENCOUNTER — Encounter: Payer: Self-pay | Admitting: Vascular Surgery

## 2015-06-05 MED ORDER — IOHEXOL 300 MG/ML  SOLN
INTRAMUSCULAR | Status: DC | PRN
  Administered 2015-04-08: 90 mL via INTRA_ARTERIAL

## 2015-06-10 ENCOUNTER — Encounter: Payer: Medicare Other | Admitting: Surgery

## 2015-06-10 DIAGNOSIS — L97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: Secondary | ICD-10-CM | POA: Diagnosis not present

## 2015-06-10 NOTE — Progress Notes (Addendum)
MERNA, BALDI ([id number]) Visit Report for 06/10/2015 Arrival Information Details Patient Name: [name], [name] Date of Service: 06/10/2015 11:30 AM Medical Record Number: [id number] Patient Account Number: [id number] Date of Birth/Sex: Mar 23, 1921 (81 y.o. Female) Treating RN: Afful, RN, BSN, [place] Sink Primary Care Physician: Dorothey Baseman Other Clinician: Referring Physician: Terance Hart, DAVID Treating Physician/Extender: Rudene Re in Treatment: 17 Visit Information History Since Last Visit Added or deleted any medications: No Patient Arrived: Wheel Chair Any new allergies or adverse reactions: No Arrival Time: 11:39 Had a fall or experienced change in No Accompanied By: dtyr inlaw activities of daily living that may affect Transfer Assistance: EasyPivot Patient risk of falls: Lift Signs or symptoms of abuse/neglect since last No Patient Identification Verified: Yes visito Secondary Verification Process Yes Hospitalized since last visit: No Completed: Has Dressing in Place as Prescribed: Yes Patient Has Alerts: Yes Pain Present Now: No Patient Alerts: DMII Electronic Signature(s) Signed: 06/10/2015 11:40:24 AM By: Elpidio Eric BSN, RN Entered By: Elpidio Eric on 06/10/2015 11:40:24 [name] ([id number]) -------------------------------------------------------------------------------- Clinic Level of Care Assessment Details Patient Name: [name] Date of Service: 06/10/2015 11:30 AM Medical Record Number: [id number] Patient Account Number: [id number] Date of Birth/Sex: 1921-09-15 (81 y.o. Female) Treating RN: Afful, RN, BSN, [place] Sink Primary Care Physician: Terance Hart, DAVID Other Clinician: Referring Physician: Terance Hart, DAVID Treating Physician/Extender: Rudene Re in Treatment: 17 Clinic Level of Care Assessment Items TOOL 4 Quantity Score []  - Use when only an EandM is performed on FOLLOW-UP visit 0 ASSESSMENTS - Nursing Assessment /  Reassessment X - Reassessment of Co-morbidities (includes updates in patient status) 1 10 X - Reassessment of Adherence to Treatment Plan 1 5 ASSESSMENTS - Wound and Skin Assessment / Reassessment X - Simple Wound Assessment / Reassessment - one wound 1 5 []  - Complex Wound Assessment / Reassessment - multiple wounds 0 []  - Dermatologic / Skin Assessment (not related to wound area) 0 ASSESSMENTS - Focused Assessment []  - Circumferential Edema Measurements - multi extremities 0 []  - Nutritional Assessment / Counseling / Intervention 0 X - Lower Extremity Assessment (monofilament, tuning fork, pulses) 1 5 []  - Peripheral Arterial Disease Assessment (using hand held doppler) 0 ASSESSMENTS - Ostomy and/or Continence Assessment and Care []  - Incontinence Assessment and Management 0 []  - Ostomy Care Assessment and Management (repouching, etc.) 0 PROCESS - Coordination of Care []  - Simple Patient / Family Education for ongoing care 0 []  - Complex (extensive) Patient / Family Education for ongoing care 0 []  - Staff obtains Chiropractor, Records, Test Results / Process Orders 0 []  - Staff telephones HHA, Nursing Homes / Clarify orders / etc 0 []  - Routine Transfer to another Facility (non-emergent condition) 0 Sowder, Ryka ([id number]) []  - Routine Hospital Admission (non-emergent condition) 0 []  - New Admissions / Manufacturing engineer / Ordering NPWT, Apligraf, etc. 0 []  - Emergency Hospital Admission (emergent condition) 0 []  - Simple Discharge Coordination 0 []  - Complex (extensive) Discharge Coordination 0 PROCESS - Special Needs []  - Pediatric / Minor Patient Management 0 []  - Isolation Patient Management 0 []  - Hearing / Language / Visual special needs 0 []  - Assessment of Community assistance (transportation, D/C planning, etc.) 0 []  - Additional assistance / Altered mentation 0 []  - Support Surface(s) Assessment (bed, cushion, seat, etc.) 0 INTERVENTIONS - Wound Cleansing /  Measurement X - Simple Wound Cleansing - one wound 1 5 []  - Complex Wound Cleansing - multiple wounds 0 X - Wound Imaging (photographs - any number of wounds) 1 5 []  -  Wound Tracing (instead of photographs) 0 X - Simple Wound Measurement - one wound 1 5 []  - Complex Wound Measurement - multiple wounds 0 INTERVENTIONS - Wound Dressings X - Small Wound Dressing one or multiple wounds 2 10 []  - Medium Wound Dressing one or multiple wounds 0 []  - Large Wound Dressing one or multiple wounds 0 []  - Application of Medications - topical 0 []  - Application of Medications - injection 0 INTERVENTIONS - Miscellaneous []  - External ear exam 0 Minardi, Ami ([id number]) []  - Specimen Collection (cultures, biopsies, blood, body fluids, etc.) 0 []  - Specimen(s) / Culture(s) sent or taken to Lab for analysis 0 []  - Patient Transfer (multiple staff / Michiel Sites Lift / Similar devices) 0 []  - Simple Staple / Suture removal (25 or less) 0 []  - Complex Staple / Suture removal (26 or more) 0 []  - Hypo / Hyperglycemic Management (close monitor of Blood Glucose) 0 []  - Ankle / Brachial Index (ABI) - do not check if billed separately 0 X - Vital Signs 1 5 Has the patient been seen at the hospital within the last three years: Yes Total Score: 65 Level Of Care: New/Established - Level 2 Electronic Signature(s) Signed: 06/10/2015 12:06:14 PM By: Elpidio Eric BSN, RN Entered By: Elpidio Eric on 06/10/2015 12:06:13 [name] ([id number]) -------------------------------------------------------------------------------- Encounter Discharge Information Details Patient Name: [name] Date of Service: 06/10/2015 11:30 AM Medical Record Number: [id number] Patient Account Number: [id number] Date of Birth/Sex: 1921/02/22 (81 y.o. Female) Treating RN: Clover Mealy, RN, BSN, [place] Sink Primary Care Physician: Dorothey Baseman Other Clinician: Referring Physician: Terance Hart DAVID Treating Physician/Extender: Rudene Re in Treatment: 32 Encounter Discharge Information Items Discharge Pain Level: 0 Discharge Condition: Stable Ambulatory Status: Wheelchair Discharge Destination: Home Transportation: Private Auto Accompanied By: dtr in law Schedule Follow-up Appointment: No Medication Reconciliation completed and provided to Patient/Care No Nathanyal Ashmead: Provided on Clinical Summary of Care: 06/10/2015 Form Type Recipient Paper Patient HS Electronic Signature(s) Signed: 06/10/2015 12:12:24 PM By: Gwenlyn Perking Previous Signature: 06/10/2015 12:08:01 PM Version By: Elpidio Eric BSN, RN Entered By: Gwenlyn Perking on 06/10/2015 12:12:23 [name] ([id number]) -------------------------------------------------------------------------------- Lower Extremity Assessment Details Patient Name: [name] Date of Service: 06/10/2015 11:30 AM Medical Record Number: [id number] Patient Account Number: [id number] Date of Birth/Sex: [birth year] (81 y.o. Female) Treating RN: Afful, RN, BSN, [place] Sink Primary Care Physician: Dorothey Baseman Other Clinician: Referring Physician: Terance Hart, DAVID Treating Physician/Extender: Rudene Re in Treatment: 17 Vascular Assessment Pulses: Posterior Tibial Dorsalis Pedis Palpable: [Right:No] Doppler: [Right:Monophasic] Extremity colors, hair growth, and conditions: Extremity Color: [Right:Normal] Hair Growth on Extremity: [Right:No] Temperature of Extremity: [Right:Warm] Capillary Refill: [Right:< 3 seconds] Toe Nail Assessment Left: Right: Thick: Yes Discolored: Yes Deformed: No Improper Length and Hygiene: Yes Electronic Signature(s) Signed: 06/10/2015 11:46:22 AM By: Elpidio Eric BSN, RN Entered By: Elpidio Eric on 06/10/2015 11:46:22 [name] ([id number]) -------------------------------------------------------------------------------- Multi Wound Chart Details Patient Name: [name] Date of Service: 06/10/2015 11:30 AM Medical  Record Number: [id number] Patient Account Number: [id number] Date of Birth/Sex: 1921-08-19 (81 y.o. 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] [N/A:N/A] Comorbid History: [  1:Arrhythmia, Deep Vein Thrombosis, Peripheral Arterial Disease, Type II Arterial Disease, Type II Diabetes] [3:Arrhythmia, Deep Vein Thrombosis, Peripheral Diabetes] [N/A:N/A] Date Acquired: [1:11/04/2014] [3:01/03/2015] [N/A:N/A] Weeks of Treatment: [1:17] [3:16] [N/A:N/A] Wound Status: [1:Open] [3:Open] [N/A:N/A] Measurements L x W x D 1.2x1.4x0.2 [3:0x0x0] [N/A:N/A] (cm) Area (cm) : [1:1.319] [3:0] [N/A:N/A] Volume (cm) : [1:0.264] [3:0] [N/A:N/A] % Reduction in Area: [1:76.00%] [3:100.00%] [N/A:N/A] % Reduction in Volume: 52.00% [3:100.00%] [N/A:N/A] Classification: [1:Full Thickness Without Exposed Support Structures] [3:Full Thickness Without Exposed Support Structures] [N/A:N/A] HBO Classification: [1:Grade 1] [3:Grade 1] [N/A:N/A] Exudate Amount: [1:Medium] [3:None Present] [N/A:N/A] Exudate Type: [1:Serous] [3:N/A] [N/A:N/A] Exudate Color: [1:amber] [3:N/A] [N/A:N/A] Wound Margin: [1:Flat and Intact] [3:Flat and Intact] [N/A:N/A] Granulation Amount: [1:Large (67-100%)] [3:None Present (0%)] [N/A:N/A] Granulation Quality: [1:Red] [3:N/A] [N/A:N/A] Necrotic Amount: [1:Small (1-33%)] [3:None Present (0%)]  [N/A:N/A] Exposed Structures: [N/A:N/A] Fascia: No Fascia: No Fat: No Fat: No Tendon: No Tendon: No Muscle: No Muscle: No Joint: No Joint: No Bone: No Bone: No Limited to Skin Limited to Skin Breakdown Breakdown Epithelialization: None Large (67-100%) N/A Periwound Skin Texture: Edema: No Edema: No N/A Excoriation: No Excoriation: No Induration: No Induration: No Callus: No Callus: No Crepitus: No Crepitus: No Fluctuance: No Fluctuance: No Friable: No Friable: No Rash: No Rash: No Scarring: No Scarring: No Periwound Skin Moist: Yes Dry/Scaly: Yes N/A Moisture: Maceration: No Maceration: No Dry/Scaly: No Moist: No Periwound Skin Color: Atrophie Blanche: No Atrophie Blanche: No N/A Cyanosis: No Cyanosis: No Ecchymosis: No Ecchymosis: No Erythema: No Erythema: No Hemosiderin Staining: No Hemosiderin Staining: No Mottled: No Mottled: No Pallor: No Pallor: No Rubor: No Rubor: No Temperature: No Abnormality N/A N/A Tenderness on Yes No N/A Palpation: Wound Preparation: Ulcer Cleansing: Ulcer Cleansing: N/A Rinsed/Irrigated with Rinsed/Irrigated with Saline Saline Topical Anesthetic Topical Anesthetic Applied: Other: lidocaine Applied: None 4% Treatment Notes Electronic Signature(s) Signed: 06/10/2015 11:51:07 AM By: Elpidio Eric BSN, RN Entered By: Elpidio Eric on 06/10/2015 11:51:06 [name] ([id number]) -------------------------------------------------------------------------------- Multi-Disciplinary Care Plan Details Patient Name: [name] Date of Service: 06/10/2015 11:30 AM Medical Record Number: [id number] Patient Account Number: [id number] Date of Birth/Sex: [birth year] (81 y.o. Female) Treating RN: Afful, RN, BSN, [place] Sink Primary Care Physician: Terance Hart, DAVID Other Clinician: Referring Physician: Terance Hart, DAVID Treating Physician/Extender: Rudene Re in Treatment: 65 Active Inactive Abuse / Safety / Falls / Self  Care Management Nursing Diagnoses: Impaired physical mobility Potential for falls Goals: Patient will remain injury free Date Initiated: 02/11/2015 Goal Status: Active Patient/caregiver will verbalize understanding of skin care regimen Date Initiated: 02/11/2015 Goal Status: Active Patient/caregiver will verbalize/demonstrate measures taken to prevent injury and/or falls Date Initiated: 02/11/2015 Goal Status: Active Patient/caregiver will verbalize/demonstrate understanding of what to do in case of emergency Date Initiated: 02/11/2015 Goal Status: Active Interventions: Assess fall risk on admission and as needed Provide education on fall prevention Provide education on safe transfers Treatment Activities: Patient referred to home care : 06/10/2015 Notes: Nutrition Nursing Diagnoses: Imbalanced nutrition Potential for alteratiion in Nutrition/Potential for imbalanced nutrition Stogner, Kamla ([id number]) Goals: Patient/caregiver verbalizes understanding of need to maintain therapeutic glucose control per primary care physician Date Initiated: 02/11/2015 Goal Status: Active Patient/caregiver will maintain therapeutic glucose control Date Initiated: 02/11/2015 Goal Status: Active Interventions: Assess HgA1c results as ordered upon admission and as needed Provide education on elevated blood sugars and impact on wound healing Provide education on nutrition Treatment Activities: Education provided on Nutrition : 04/22/2015 Obtain HgA1c : 06/10/2015 Notes: Wound/Skin Impairment Nursing Diagnoses: Impaired tissue integrity Knowledge deficit related to ulceration/compromised skin integrity  Goals: Patient/caregiver will verbalize understanding of skin care regimen Date Initiated: 02/11/2015 Goal Status: Active Ulcer/skin breakdown will heal within 14 weeks Date Initiated: 02/11/2015 Goal Status: Active Interventions: Assess patient/caregiver ability to obtain necessary  supplies Assess patient/caregiver ability to perform ulcer/skin care regimen upon admission and as needed Assess ulceration(s) every visit Provide education on ulcer and skin care Treatment Activities: Skin care regimen initiated : 06/10/2015 Topical wound management initiated : 06/10/2015 Notes: ILAMAE, GENG ([id number]) Electronic Signature(s) Signed: 06/10/2015 11:50:58 AM By: Elpidio Eric BSN, RN Entered By: Elpidio Eric on 06/10/2015 11:50:57 [name] ([id number]) -------------------------------------------------------------------------------- Pain Assessment Details Patient Name: [name] Date of Service: 06/10/2015 11:30 AM Medical Record Number: [id number] Patient Account Number: [id number] Date of Birth/Sex: July 07, 1921 (81 y.o. Female) Treating RN: Clover Mealy, RN, BSN, [place] Sink Primary Care Physician: Dorothey Baseman Other Clinician: Referring Physician: Dorothey Baseman Treating Physician/Extender: Rudene Re in Treatment: 17 Active Problems Location of Pain Severity and Description of Pain Patient Has Paino No Site Locations Pain Management and Medication Current Pain Management: Electronic Signature(s) Signed: 06/10/2015 11:42:25 AM By: Elpidio Eric BSN, RN Entered By: Elpidio Eric on 06/10/2015 11:42:25 [name] ([id number]) -------------------------------------------------------------------------------- Patient/Caregiver Education Details Patient Name: [name] Date of Service: 06/10/2015 11:30 AM Medical Record Number: [id number] Patient Account Number: [id number] Date of Birth/Gender: January 05, 1921 (81 y.o. Female) Treating RN: Afful, RN, BSN, [place] Sink Primary Care Physician: Dorothey Baseman Other Clinician: Referring Physician: Terance Hart DAVID Treating Physician/Extender: Rudene Re in Treatment: 17 Education Assessment Education Provided To: Patient and Caregiver Education Topics Provided Elevated Blood Sugar/ Impact on  Healing: Methods: Explain/Verbal Responses: State content correctly Nutrition: Methods: Explain/Verbal Responses: State content correctly Safety: Methods: Explain/Verbal Responses: State content correctly Wound/Skin Impairment: Methods: Explain/Verbal Responses: State content correctly Electronic Signature(s) Signed: 06/10/2015 12:10:28 PM By: Elpidio Eric BSN, RN Entered By: Elpidio Eric on 06/10/2015 12:10:27 [name] ([id number]) -------------------------------------------------------------------------------- Wound Assessment Details Patient Name: [name] Date of Service: 06/10/2015 11:30 AM Medical Record Number: [id number] Patient Account Number: [id number] Date of Birth/Sex: [birth year] (81 y.o. 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 Wound Open Wounding Event: Gradually Appeared Status: Date Acquired: 11/04/2014 Comorbid Arrhythmia, Deep Vein Thrombosis, Weeks Of Treatment: 17 History: Peripheral Arterial Disease, Type II Clustered Wound: No Diabetes Photos Wound Measurements Length: (cm) 1.2 Width: (cm) 1.4 Depth: (cm) 0.2 Area: (cm) 1.319 Volume: (cm) 0.264 % Reduction in Area: 76% % Reduction in Volume: 52% Epithelialization: None Tunneling: No Wound Description Full Thickness Without Classification: Exposed Support Structures Diabetic Severity Grade 1 (Wagner): Wound Margin: Flat and Intact Exudate Amount: Medium Exudate Type: Serous Exudate Color: amber Foul Odor After Cleansing: No Wound Bed Granulation Amount: Large (67-100%) Exposed Structure Granulation Quality: Red Fascia Exposed: No Haselton, Lessa ([id number]) Necrotic Amount: Small (1-33%) Fat Layer  Exposed: No Necrotic Quality: Adherent Slough Tendon Exposed: No Muscle Exposed: No Joint Exposed: No Bone Exposed: No Limited to Skin Breakdown Periwound Skin Texture Texture Color No Abnormalities Noted: No No Abnormalities Noted: No Callus: No Atrophie Blanche: No Crepitus: No Cyanosis: No Excoriation: No Ecchymosis: No Fluctuance: No Erythema: No Friable: No Hemosiderin Staining: No Induration: No Mottled: No Localized Edema: No Pallor: No Rash: No Rubor: No Scarring: No Temperature / Pain Moisture Temperature: No Abnormality No Abnormalities Noted: No Tenderness on Palpation: Yes Dry / Scaly: No Maceration: No Moist: Yes  Wound Preparation Ulcer Cleansing: Rinsed/Irrigated with Saline Topical Anesthetic Applied: Other: lidocaine 4%, Treatment Notes Wound #1 (Right Calcaneous) 1. Cleansed with: Clean wound with Normal Saline 4. Dressing Applied: Prisma Ag 5. Secondary Dressing Applied ABD and Kerlix/Conform Electronic Signature(s) Signed: 06/11/2015 2:34:23 PM By: Elpidio Eric BSN, RN Previous Signature: 06/10/2015 11:50:17 AM Version By: Elpidio Eric BSN, RN Entered By: Elpidio Eric on 06/11/2015 14:34:23 [name] ([id number]) -------------------------------------------------------------------------------- Wound Assessment Details Patient Name: [name] Date of Service: 06/10/2015 11:30 AM Medical Record Number: [id number] Patient Account Number: [id number] Date of Birth/Sex: 1921/08/28 (81 y.o. Female) Treating RN: Afful, RN, BSN, [place] Sink Primary Care Physician: Terance Hart, DAVID Other Clinician: Referring Physician: Terance Hart, DAVID Treating Physician/Extender: Rudene Re in Treatment: 17 Wound Status Wound Number: 3 Primary Dehisced Wound Etiology: Wound Location: Left Amputation Site - Below Knee Wound Open Status: Wounding Event: Surgical Injury Comorbid Arrhythmia, Deep Vein Thrombosis, Date Acquired: 01/03/2015 History:  Peripheral Arterial Disease, Type II Weeks Of Treatment: 16 Diabetes Clustered Wound: No Photos Wound Measurements Length: (cm) 0.1 Width: (cm) 0.1 Depth: (cm) 0.1 Area: (cm) 0.008 Volume: (cm) 0.001 % Reduction in Area: 99.9% % Reduction in Volume: 100% Epithelialization: Large (67-100%) Tunneling: No Undermining: No Wound Description Full Thickness Without Classification: Exposed Support Structures Diabetic Severity Grade 1 (Wagner): Wound Margin: Flat and Intact Exudate Amount: None Present Foul Odor After Cleansing: No Wound Bed Granulation Amount: Large (67-100%) Exposed Structure Necrotic Amount: None Present (0%) Fascia Exposed: No Fat Layer Exposed: No Tendon Exposed: No [place], Jauna ([id number]) Muscle Exposed: No Joint Exposed: No Bone Exposed: No Limited to Skin Breakdown Periwound Skin Texture Texture Color No Abnormalities Noted: No No Abnormalities Noted: No Callus: No Atrophie Blanche: No Crepitus: No Cyanosis: No Excoriation: No Ecchymosis: No Fluctuance: No Erythema: No Friable: No Hemosiderin Staining: No Induration: No Mottled: No Localized Edema: No Pallor: No Rash: No Rubor: No Scarring: No Moisture No Abnormalities Noted: No Dry / Scaly: Yes Maceration: No Moist: No Wound Preparation Ulcer Cleansing: Rinsed/Irrigated with Saline Topical Anesthetic Applied: None Treatment Notes Wound #3 (Left Amputation Site - Below Knee) 1. Cleansed with: Clean wound with Normal Saline 4. Dressing Applied: Prisma Ag 5. Secondary Dressing Applied ABD and Kerlix/Conform Electronic Signature(s) Signed: 06/11/2015 2:35:03 PM By: Elpidio Eric BSN, RN Previous Signature: 06/10/2015 11:55:34 AM Version By: Elpidio Eric BSN, RN Previous Signature: 06/10/2015 11:50:47 AM Version By: Elpidio Eric BSN, RN Entered By: Elpidio Eric on 06/11/2015 14:35:03 [name]  ([id number]) -------------------------------------------------------------------------------- Vitals Details Patient Name: [name] Date of Service: 06/10/2015 11:30 AM Medical Record Number: [id number] Patient Account Number: [id number] Date of Birth/Sex: 1921/07/26 (81 y.o. Female) Treating RN: Afful, RN, BSN, [place] Sink Primary Care Physician: Terance Hart, DAVID Other Clinician: Referring Physician: Terance Hart, DAVID Treating Physician/Extender: Rudene Re in Treatment: 17 Vital Signs Time Taken: 11:42 Temperature (F): 97.6 Height (in): 62 Pulse (bpm): 77 Weight (lbs): 155 Respiratory Rate (breaths/min): 18 Body Mass Index (BMI): 28.3 Blood Pressure (mmHg): 114/84 Reference Range: 80 - 120 mg / dl Electronic Signature(s) Signed: 06/10/2015 11:44:23 AM By: Elpidio Eric BSN, RN Entered By: Elpidio Eric on 06/10/2015 11:44:23

## 2015-06-10 NOTE — Progress Notes (Addendum)
[name], [name] ([id number]) Visit Report for 06/10/2015 Chief Complaint Document Details Patient Name: [name], [name] Date of Service: 06/10/2015 11:30 AM Medical Record Number: [id number] Patient Account Number: [id number] Date of Birth/Sex: 10-05-1920 (81 y.o. Female) Treating RN: Clover Mealy, RN, BSN, [place] Sink Primary Care Physician: Dorothey Baseman Other Clinician: Referring Physician: Dorothey Baseman Treating Physician/Extender: Rudene Re in Treatment: 17 Information Obtained from: Patient Chief Complaint Patient presents to the wound care center for a consult due non healing wound. 81 year old patient with comes with a history of a ulcerated area to the right third toe and the right heel which she's had for about 2 months. Electronic Signature(s) Signed: 06/10/2015 12:15:24 PM By: Evlyn Kanner MD, FACS Entered By: Evlyn Kanner on 06/10/2015 12:15:24 [name] ([id number]) -------------------------------------------------------------------------------- HPI Details Patient Name: [name] Date of Service: 06/10/2015 11:30 AM Medical Record Number: [id number] Patient Account Number: [id number] Date of Birth/Sex: 08/07/1921 (81 y.o. Female) Treating RN: Afful, RN, BSN, [place] Sink Primary Care Physician: Dorothey Baseman Other Clinician: Referring Physician: Terance Hart, DAVID Treating Physician/Extender: Rudene Re in Treatment: 17 History of Present Illness Location: right medial heel and right third toe Quality: Patient reports experiencing a dull pain to affected area(s). Severity: Patient states wound are getting worse. Duration: Patient has had the wound for > 3 months prior to seeking treatment at the wound center Timing: Pain in wound is Intermittent (comes and goes Context: The wound appeared gradually over time Modifying Factors: Consults to this date include:vascular surgeon and several recent surgeries. Associated Signs and Symptoms: Patient reports  having foul odor and drainage from the left below-knee amputation site. HPI Description: A pleasant 81 year old patient who is known to have diabetes mellitus for several years has recently gone through a series of operations with the vascular surgeon Dr. Gilda Crease. In January and February she had several surgeries on the left lower extremity with an attempt to have limb salvage for gangrenous changes of her forefoot. 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 On 01/07/2015 she also had a right lower extremity distal runoff with a angioplasty of the right anterior tibial artery to maximize her blood flow to the foot. She recently had her staples removed at Dr. Marijean Heath office on 01/31/2015 and at that time a right lower extremity duplex was done which showed patent vessels and a patent stent with distal occluded posterior tibial artery. Though the duplex was noncritical the recommendations from the PA at the vascular surgery office was that of a angiogram to be done but the patient said she would rather try some bone care before. The patient has received doxycycline and Cipro in the recent past and she takes oral medications for her diabetes. Other than that I reviewed her list of all her medications. No recent hemoglobin A1c has been done and no recent x-rays of the right foot have been done. 02/18/2015 -- x-ray of the right foot was done on 02/11/2015 and it shows no evidence of acute osteomyelitis of the third toe or of the calcaneus. She had gone to the vascular surgery office and they had noted that there is dehiscence of the left part of the amputation site of the below-knee wound and they have asked [place] to kindly take over the care of this. There've been doing dressings for the right heel and right third toe. 02/25/2015 -- we have received notes from the vascular group who saw her last on 02/13/2015 and she was  seen  by the PA Ms. Cleda Daub. She had recommended that the patient continue to follow with [place] for wound care including management of the left BKA stump, which has had dehiscence of the lateral part. They will consider repeating a arterial duplex or angiogram if the right lower extremity does not heal within a reasonable period of time. 03/18/2015 -- he saw the vascular surgeon Dr. Gilda Crease and he has set her up for an angioplasty sometime in the middle of July. she is doing well otherwise. [name], [name] ([id number]) 04/10/2015 -- the patient had a procedure done on 04/08/2015 and this was a right angioplasty of the dorsalis pedis, anterior tibial artery, first superficial femoral artery in its midportion. There was successful intervention with recanalization and in-line flow to the right foot. 04/22/2015 -- the patient was looking rather pale today and the daughter confirms that her hemoglobin was down to 6.9. she is being monitored by her PCP. Her Lasix dose was increased and her edema has gone down significantly. 04/29/2015 -- she had vascular studies done and the ABI on the right is 1.3 and the toe pressures were within normal limits. Her hemoglobin is still around 7 and she refuses to take any blood transfusions for religious reasons. Her potassium was normal. 06/10/2015 -- she has a new blister on her right lateral and posterior part of her leg just in the region where the Kerlix bandages were applied and this may be due to an abrasion. Electronic Signature(s) Signed: 06/10/2015 12:16:19 PM By: Evlyn Kanner MD, FACS Entered By: Evlyn Kanner on 06/10/2015 12:16:18 [name] ([id number]) -------------------------------------------------------------------------------- Physical Exam Details Patient Name: [name] Date of Service: 06/10/2015 11:30 AM Medical Record Number: [id number] Patient Account Number: [id number] Date of Birth/Sex: April 12, 1921 (81 y.o.  Female) Treating RN: Clover Mealy, RN, BSN, [place] Sink Primary Care Physician: Dorothey Baseman Other Clinician: Referring Physician: Terance Hart, DAVID Treating Physician/Extender: Rudene Re in Treatment: 17 Constitutional . Pulse regular. Respirations normal and unlabored. Afebrile. . Eyes Nonicteric. Reactive to light. Ears, Nose, Mouth, and Throat Lips, teeth, and gums WNL.Marland Kitchen Moist mucosa without lesions . Neck supple and nontender. No palpable supraclavicular or cervical adenopathy. Normal sized without goiter. Respiratory WNL. No retractions.. Cardiovascular Pedal Pulses WNL. No clubbing, cyanosis or edema. Chest Breasts symmetical and no nipple discharge.. Breast tissue WNL, no masses, lumps, or tenderness.. Lymphatic No adneopathy. No adenopathy. No adenopathy. Musculoskeletal Adexa without tenderness or enlargement.. Digits and nails w/o clubbing, cyanosis, infection, petechiae, ischemia, or inflammatory conditions.. Integumentary (Hair, Skin) No suspicious lesions. No crepitus or fluctuance. No peri-wound warmth or erythema. No masses.Marland Kitchen Psychiatric Judgement and insight Intact.. No evidence of depression, anxiety, or agitation.. Notes There is a new blister on the right lateral and posterior part of her ankle region. The calcaneal wound is looking clean and we will continue local dressing with Prisma AG. Her amputation stump looks excellent. Electronic Signature(s) Signed: 06/10/2015 12:18:24 PM By: Evlyn Kanner MD, FACS Entered By: Evlyn Kanner on 06/10/2015 12:18:24 [name] ([id number]) -------------------------------------------------------------------------------- Physician Orders Details Patient Name: [name] Date of Service: 06/10/2015 11:30 AM Medical Record Number: [id number] Patient Account Number: [id number] Date of Birth/Sex: 1920-12-24 (81 y.o. Female) Treating RN: Clover Mealy, RN, BSN, [place] Sink Primary Care Physician: Dorothey Baseman Other  Clinician: Referring Physician: Terance Hart DAVID Treating Physician/Extender: Rudene Re in Treatment: 68 Verbal / Phone Orders: Yes Clinician: Afful, RN, BSN, Rita Read Back and Verified: Yes Diagnosis Coding Wound Cleansing Wound #1 Right Calcaneous o Cleanse wound with mild soap and water o May Shower,  gently pat wound dry prior to applying new dressing. o May shower with protection. Wound #3 Left Amputation Site - Below Knee o Cleanse wound with mild soap and water o May Shower, gently pat wound dry prior to applying new dressing. o May shower with protection. Primary Wound Dressing Wound #1 Right Calcaneous o Prisma Ag Wound #3 Left Amputation Site - Below Knee o Iodoform packing Gauze Secondary Dressing Wound #1 Right Calcaneous o ABD pad o Gauze and Kerlix/Conform Wound #3 Left Amputation Site - Below Knee o ABD and Kerlix/Conform Dressing Change Frequency Wound #1 Right Calcaneous o Change dressing every other day. Wound #3 Left Amputation Site - Below Knee o Change dressing every other day. Follow-up Appointments Wound #1 Right Calcaneous o Return Appointment in 1 week. [name], [name] ([id number]) Wound #3 Left Amputation Site - Below Knee o Return Appointment in 1 week. Home Health Wound #1 Right Calcaneous o Continue Home Health Visits - [facility] o Home Health Nurse may visit PRN to address patientos wound care needs. o FACE TO FACE ENCOUNTER: MEDICARE and MEDICAID PATIENTS: I certify that this patient is under my care and that I had a face-to-face encounter that meets the physician face-to-face encounter requirements with this patient on this date. The encounter with the patient was in whole or in part for the following MEDICAL CONDITION: (primary reason for Home Healthcare) MEDICAL NECESSITY: I certify, that based on my findings, NURSING services are a medically necessary home health service. HOME BOUND STATUS:  I certify that my clinical findings support that this patient is homebound (i.e., Due to illness or injury, pt requires aid of supportive devices such as crutches, cane, wheelchairs, walkers, the use of special transportation or the assistance of another person to leave their place of residence. There is a normal inability to leave the home and doing so requires considerable and taxing effort. Other absences are for medical reasons / religious services and are infrequent or of short duration when for other reasons). o If current dressing causes regression in wound condition, may D/C ordered dressing product/s and apply Normal Saline Moist Dressing daily until next Wound Healing Center / Other MD appointment. Notify Wound Healing Center of regression in wound condition at [phone]. o Please direct any NON-WOUND related issues/requests for orders to patient's Primary Care Physician Wound #3 Left Amputation Site - Below Knee o Continue Home Health Visits - [facility] o Home Health Nurse may visit PRN to address patientos wound care needs. o FACE TO FACE ENCOUNTER: MEDICARE and MEDICAID PATIENTS: I certify that this patient is under my care and that I had a face-to-face encounter that meets the physician face-to-face encounter requirements with this patient on this date. The encounter with the patient was in whole or in part for the following MEDICAL CONDITION: (primary reason for Home Healthcare) MEDICAL NECESSITY: I certify, that based on my findings, NURSING services are a medically necessary home health service. HOME BOUND STATUS: I certify that my clinical findings support that this patient is homebound (i.e., Due to illness or injury, pt requires aid of supportive devices such as crutches, cane, wheelchairs, walkers, the use of special transportation or the assistance of another person to leave their place of residence. There is a normal inability to leave the home and doing so  requires considerable and taxing effort. Other absences are for medical reasons / religious services and are infrequent or of short duration when for other reasons). o If current dressing causes regression in wound condition, may D/C  ordered dressing product/s and apply Normal Saline Moist Dressing daily until next Wound Healing Center / Other MD appointment. Notify Wound Healing Center of regression in wound condition at [phone]. o Please direct any NON-WOUND related issues/requests for orders to patient's Primary Care Physician Electronic Signature(s) [name], [name] ([id number]) Signed: 06/10/2015 12:05:13 PM By: Elpidio Eric BSN, RN Signed: 06/10/2015 4:28:23 PM By: Evlyn Kanner MD, FACS Entered By: Elpidio Eric on 06/10/2015 12:05:12 [name] ([id number]) -------------------------------------------------------------------------------- Problem List Details Patient Name: [name] Date of Service: 06/10/2015 11:30 AM Medical Record Number: [id number] Patient Account Number: [id number] Date of Birth/Sex: 03-Jan-1921 (81 y.o. 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 with 02/11/2015 Yes fat layer exposed L97.812 Non-pressure chronic ulcer of other part of right lower leg 02/11/2015 Yes with fat layer exposed T81.31XA Disruption of external operation (surgical) wound, not 02/18/2015 Yes elsewhere classified, initial encounter Inactive  Problems Resolved Problems Electronic Signature(s) Signed: 06/10/2015 12:15:12 PM By: Evlyn Kanner MD, FACS Entered By: Evlyn Kanner on 06/10/2015 12:15:12 [name] ([id number]) -------------------------------------------------------------------------------- Progress Note Details Patient Name: [name] Date of Service: 06/10/2015 11:30 AM Medical Record Number: [id number] Patient Account Number: [id number] Date of Birth/Sex: [birth year] (81 y.o. Female) Treating RN: Clover Mealy, RN, BSN, [place] Sink Primary Care Physician: Dorothey Baseman Other Clinician: Referring Physician: Dorothey Baseman Treating Physician/Extender: Rudene Re in Treatment: 17 Subjective Chief Complaint Information obtained from Patient Patient presents to the wound care center for a consult due non healing wound. 81 year old patient with comes with a history of a ulcerated area to the right third toe and the right heel which she's had for about 2 months. History of Present Illness (HPI) The following HPI elements were documented for the patient's wound: Location: right medial heel and right third toe Quality: Patient reports experiencing a dull pain to affected area(s). Severity: Patient states wound are getting worse. Duration: Patient has had the wound for > 3 months prior to seeking treatment at the wound center Timing: Pain in wound is Intermittent (comes and goes Context: The wound appeared gradually over time Modifying Factors: Consults to this date include:vascular surgeon and several recent surgeries. Associated Signs and Symptoms: Patient reports having foul odor and drainage from the left below-knee amputation site. A pleasant 81 year old patient who is known to have diabetes mellitus for several years has recently gone through a series of operations with the vascular surgeon Dr. Gilda Crease. In January and February she had several surgeries on the left lower extremity with an attempt to have  limb salvage for gangrenous changes of her forefoot. 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 On 01/07/2015 she also had a right lower extremity distal runoff with a angioplasty of the right anterior tibial artery to maximize her blood flow to the foot. She recently had her staples removed at Dr. Marijean Heath office on 01/31/2015 and at that time a right lower extremity duplex was done which showed patent vessels and a patent stent with distal occluded posterior tibial artery. Though the duplex was noncritical the  recommendations from the PA at the vascular surgery office was that of a angiogram to be done but the patient said she would rather try some bone care before. The patient has received doxycycline and Cipro in the recent past and she takes oral medications for her diabetes. Other than that I reviewed her list of all her medications. No recent hemoglobin A1c has been done and no recent x-rays of the right foot have been done. 02/18/2015 -- x-ray of the right foot was done on 02/11/2015 and it shows no evidence of acute osteomyelitis of the third toe or of the calcaneus. She had gone to the vascular surgery office and they had noted that there is dehiscence of the left part of the amputation site of the below-knee wound and they have asked [place] to kindly take over the care of this. There've been doing dressings for the right heel and right third toe. [name], [name] ([id number]) 02/25/2015 -- we have received notes from the vascular group who saw her last on 02/13/2015 and she was seen by the PA Ms. Cleda Daub. She had recommended that the patient continue to follow with [place] for wound care including management of the left BKA stump, which has had dehiscence of the lateral part. They will consider repeating a arterial duplex or angiogram if the right lower extremity does not heal within a reasonable period of  time. 03/18/2015 -- he saw the vascular surgeon Dr. Gilda Crease and he has set her up for an angioplasty sometime in the middle of July. she is doing well otherwise. 04/10/2015 -- the patient had a procedure done on 04/08/2015 and this was a right angioplasty of the dorsalis pedis, anterior tibial artery, first superficial femoral artery in its midportion. There was successful intervention with recanalization and in-line flow to the right foot. 04/22/2015 -- the patient was looking rather pale today and the daughter confirms that her hemoglobin was down to 6.9. she is being monitored by her PCP. Her Lasix dose was increased and her edema has gone down significantly. 04/29/2015 -- she had vascular studies done and the ABI on the right is 1.3 and the toe pressures were within normal limits. Her hemoglobin is still around 7 and she refuses to take any blood transfusions for religious reasons. Her potassium was normal. 06/10/2015 -- she has a new blister on her right lateral and posterior part of her leg just in the region where the Kerlix bandages were applied and this may be due to an abrasion. Objective Constitutional Pulse regular. Respirations normal and unlabored. Afebrile. Vitals Time Taken: 11:42 AM, Height: 62 in, Weight: 155 lbs, BMI: 28.3, Temperature: 97.6 F, Pulse: 77 bpm, Respiratory Rate: 18 breaths/min, Blood Pressure: 114/84 mmHg. Eyes Nonicteric. Reactive to light. Ears, Nose, Mouth, and Throat Lips, teeth, and gums WNL.Marland Kitchen Moist mucosa without lesions . Neck supple and nontender. No palpable supraclavicular or cervical adenopathy. Normal sized without goiter. Respiratory WNL. No retractions.Marland Kitchen [name], [name] ([id number]) Cardiovascular Pedal Pulses WNL. No clubbing, cyanosis or edema. Chest Breasts symmetical and no nipple discharge.. Breast tissue WNL, no masses, lumps, or tenderness.. Lymphatic No adneopathy. No adenopathy. No adenopathy. Musculoskeletal Adexa without  tenderness or enlargement.. Digits and nails w/o clubbing, cyanosis, infection, petechiae, ischemia, or inflammatory conditions.Marland Kitchen Psychiatric Judgement and insight Intact.. No evidence of depression, anxiety, or agitation.. General Notes: There is a new blister on the right lateral and posterior part of her ankle region. The calcaneal wound is looking clean and we will continue local dressing with Prisma  AG. Her amputation stump looks excellent. Integumentary (Hair, Skin) No suspicious lesions. No crepitus or fluctuance. No peri-wound warmth or erythema. No masses.. Wound #1 status is Open. Original cause of wound was Gradually Appeared. The wound is located on the Right Calcaneous. The wound measures 1.2cm length x 1.4cm width x 0.2cm depth; 1.319cm^2 area and 0.264cm^3 volume. The wound is limited to skin breakdown. There is no tunneling noted. There is a medium amount of serous drainage noted. The wound margin is flat and intact. There is large (67-100%) red granulation within the wound bed. There is a small (1-33%) amount of necrotic tissue within the wound bed including Adherent Slough. The periwound skin appearance exhibited: Moist. The periwound skin appearance did not exhibit: Callus, Crepitus, Excoriation, Fluctuance, Friable, Induration, Localized Edema, Rash, Scarring, Dry/Scaly, Maceration, Atrophie Blanche, Cyanosis, Ecchymosis, Hemosiderin Staining, Mottled, Pallor, Rubor, Erythema. Periwound temperature was noted as No Abnormality. The periwound has tenderness on palpation. Wound #3 status is Open. Original cause of wound was Surgical Injury. The wound is located on the Left Amputation Site - Below Knee. The wound measures 0.1cm length x 0.1cm width x 0.1cm depth; 0.008cm^2 area and 0.001cm^3 volume. The wound is limited to skin breakdown. There is no tunneling or undermining noted. There is a none present amount of drainage noted. The wound margin is flat and intact. There is  large (67-100%) granulation within the wound bed. There is no necrotic tissue within the wound bed. The periwound skin appearance exhibited: Dry/Scaly. The periwound skin appearance did not exhibit: Callus, Crepitus, Excoriation, Fluctuance, Friable, Induration, Localized Edema, Rash, Scarring, Maceration, Moist, Atrophie Blanche, Cyanosis, Ecchymosis, Hemosiderin Staining, Mottled, Pallor, Rubor, Erythema. Assessment [name], [name] ([id number]) Active Problems ICD-10 E11.621 - Type 2 diabetes mellitus with foot ulcer I70.235 - Atherosclerosis of native arteries of right leg with ulceration of other part of foot Z89.512 - Acquired absence of left leg below knee L97.412 - Non-pressure chronic ulcer of right heel and midfoot with fat layer exposed L97.812 - Non-pressure chronic ulcer of other part of right lower leg with fat layer exposed T81.31XA - Disruption of external operation (surgical) wound, not elsewhere classified, initial encounter I have recommended Prisma AG for the right calcaneum and we will do iodoform Gauze to the left BKA stump. As far as the blister on the right leg goes we will apply some protection and if it does open up we will use Prisma AG. She will come back and see me next week. Plan Wound Cleansing: Wound #1 Right Calcaneous: Cleanse wound with mild soap and water May Shower, gently pat wound dry prior to applying new dressing. May shower with protection. Wound #3 Left Amputation Site - Below Knee: Cleanse wound with mild soap and water May Shower, gently pat wound dry prior to applying new dressing. May shower with protection. Primary Wound Dressing: Wound #1 Right Calcaneous: Prisma Ag Wound #3 Left Amputation Site - Below Knee: Iodoform packing Gauze Secondary Dressing: Wound #1 Right Calcaneous: ABD pad Gauze and Kerlix/Conform Wound #3 Left Amputation Site - Below Knee: ABD and Kerlix/Conform Dressing Change Frequency: Wound #1 Right  Calcaneous: Change dressing every other day. Wound #3 Left Amputation Site - Below Knee: Change dressing every other day. Follow-up Appointments: [name], [name] ([id number]) Wound #1 Right Calcaneous: Return Appointment in 1 week. 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 Home Health: Wound #1 Right Calcaneous: Continue [facility] Health Visits - [facility] Nurse may visit PRN to address patient s  wound care needs. FACE TO FACE ENCOUNTER: MEDICARE and MEDICAID PATIENTS: I certify that this patient is under my care and that I had a face-to-face encounter that meets the physician face-to-face encounter requirements with this patient on this date. The encounter with the patient was in whole or in part for the following MEDICAL CONDITION: (primary reason for Home Healthcare) MEDICAL NECESSITY: I certify, that based on my findings, NURSING services are a medically necessary home health service. HOME BOUND STATUS: I certify that my clinical findings support that this patient is homebound (i.e., Due to illness or injury, pt requires aid of supportive devices such as crutches, cane, wheelchairs, walkers, the use of special transportation or the assistance of another person to leave their place of residence. There is a normal inability to leave the home and doing so requires considerable and taxing effort. Other absences are for medical reasons / religious services and are infrequent or of short duration when for other reasons). If current dressing causes regression in wound condition, may D/C ordered dressing product/s and apply Normal Saline Moist Dressing daily until next Wound Healing Center / Other MD appointment. Notify Wound Healing Center of regression in wound condition at [phone]. Please direct any NON-WOUND related issues/requests for orders to patient's Primary Care Physician Wound #3 Left Amputation Site - Below Knee: Continue Home Health  Visits - [facility] Nurse may visit PRN to address patient s wound care needs. FACE TO FACE ENCOUNTER: MEDICARE and MEDICAID PATIENTS: I certify that this patient is under my care and that I had a face-to-face encounter that meets the physician face-to-face encounter requirements with this patient on this date. The encounter with the patient was in whole or in part for the following MEDICAL CONDITION: (primary reason for Home Healthcare) MEDICAL NECESSITY: I certify, that based on my findings, NURSING services are a medically necessary home health service. HOME BOUND STATUS: I certify that my clinical findings support that this patient is homebound (i.e., Due to illness or injury, pt requires aid of supportive devices such as crutches, cane, wheelchairs, walkers, the use of special transportation or the assistance of another person to leave their place of residence. There is a normal inability to leave the home and doing so requires considerable and taxing effort. Other absences are for medical reasons / religious services and are infrequent or of short duration when for other reasons). If current dressing causes regression in wound condition, may D/C ordered dressing product/s and apply Normal Saline Moist Dressing daily until next Wound Healing Center / Other MD appointment. Notify Wound Healing Center of regression in wound condition at [phone]. 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 As far as the blister on the right leg goes we will apply some protection and if it does open up we will use [employer]isma [employer]. She will come back and see me next week. [name], [name] ([id number]) Electronic Signature(s) Signed: 06/12/2015 4:29:16 PM By: Evlyn Kanner MD, FACS Previous Signature: 06/10/2015 12:23:14 PM Version By: Evlyn Kanner MD, FACS Entered By: Evlyn Kanner on 06/12/2015 16:29:16 [name] ([id number]) -------------------------------------------------------------------------------- SuperBill Details Patient Name: [name] Date of Service: 06/10/2015 Medical Record Number: [id number] Patient Account Number: [id number] Date of Birth/Sex: 02/27/1921 (81 y.o. 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 L97.812 Non-pressure chronic ulcer of other part of right lower leg with fat layer exposed Disruption of external operation (surgical) wound, not elsewhere classified, initial T81.31XA encounter Facility Procedures CPT4 Code: 16109604 Description: [phone] - WOUND CARE VISIT-LEV 2 EST PT Modifier: Quantity: 1 Physician Procedures CPT4: Description Modifier Quantity Code [id number] - WC PHYS LEVEL 3 - EST PT 1 ICD-10 Description Diagnosis E11.621 Type 2 diabetes mellitus with foot ulcer 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

## 2015-06-17 ENCOUNTER — Encounter: Payer: Medicare Other | Admitting: Surgery

## 2015-06-17 DIAGNOSIS — L97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: Secondary | ICD-10-CM | POA: Diagnosis not present

## 2015-06-17 NOTE — Progress Notes (Signed)
ALEYSIA, OLTMANN ([id number]) Visit Report for 06/17/2015 Chief Complaint Document Details Patient Name: [name], [name] Date of Service: 06/17/2015 11:00 AM Medical Record Number: [id number] Patient Account Number: [id number] Date of Birth/Sex: 22-Jun-1921 (81 y.o. Female) Treating RN: Huel Coventry Primary Care Physician: Dorothey Baseman Other Clinician: Referring Physician: Dorothey Baseman Treating Physician/Extender: Rudene Re in Treatment: 18 Information Obtained from: Patient Chief Complaint Patient presents to the wound care center for a consult due non healing wound. 81 year old patient with comes with a history of a ulcerated area to the right third toe and the right heel which she's had for about 2 months. Electronic Signature(s) Signed: 06/17/2015 11:54:11 AM By: Evlyn Kanner MD, FACS Entered By: Evlyn Kanner on 06/17/2015 11:54:11 [name] ([id number]) -------------------------------------------------------------------------------- Debridement Details Patient Name: [name] Date of Service: 06/17/2015 11:00 AM Medical Record Number: [id number] Patient Account Number: [id number] Date of Birth/Sex: 09/10/1921 (81 y.o. Female) Treating RN: Huel Coventry Primary Care Physician: Terance Hart, DAVID Other Clinician: Referring Physician: Dorothey Baseman Treating Physician/Extender: Rudene Re in Treatment: 18 Debridement Performed for Wound #1 Right Calcaneous Assessment: Performed By: Physician Tristan Schroeder., MD Debridement: Debridement Pre-procedure Yes Verification/Time Out Taken: Start Time: 11:42 Pain Control: Lidocaine 4% Topical Solution Level: Skin/Subcutaneous Tissue Total Area Debrided (L x 1.2 (cm) x 1.4 (cm) = 1.68 (cm) W): Tissue and other Viable, Non-Viable, Fibrin/Slough, Subcutaneous material debrided: Instrument: Curette Bleeding: Minimum Hemostasis Achieved: Pressure End Time: 11:43 Procedural Pain: 0 Post Procedural  Pain: 0 Response to Treatment: Procedure was tolerated well Post Debridement Measurements of Total Wound Length: (cm) 1.2 Width: (cm) 1.4 Depth: (cm) 0.2 Volume: (cm) 0.264 Post Procedure Diagnosis Same as Pre-procedure Electronic Signature(s) Signed: 06/17/2015 11:53:55 AM By: Evlyn Kanner MD, FACS Signed: 06/17/2015 2:12:38 PM By: Elliot Gurney RN, BSN, Kim RN, BSN Entered By: Evlyn Kanner on 06/17/2015 11:53:55 [name] ([id number]) -------------------------------------------------------------------------------- Debridement Details Patient Name: [name] Date of Service: 06/17/2015 11:00 AM Medical Record Number: [id number] Patient Account Number: [id number] Date of Birth/Sex: 08/04/1921 (81 y.o. Female) Treating RN: Huel Coventry Primary Care Physician: Terance Hart, DAVID Other Clinician: Referring Physician: Dorothey Baseman Treating Physician/Extender: Rudene Re in Treatment: 18 Debridement Performed for Wound #6 Right,Posterior Lower Leg Assessment: Performed By: Physician Tristan Schroeder., MD Debridement: Debridement Pre-procedure Yes Verification/Time Out Taken: Start Time: 11:41 Pain Control: Lidocaine 4% Topical Solution Level: Skin/Subcutaneous Tissue Total Area Debrided (L x 1 (cm) x 1 (cm) = 1 (cm) W): Tissue and other Viable, Non-Viable, Fibrin/Slough, Subcutaneous material debrided: Instrument: Curette Bleeding: Minimum Hemostasis Achieved: Pressure End Time: 11:42 Procedural Pain: 0 Post Procedural Pain: 0 Response to Treatment: Procedure was tolerated well Post Debridement Measurements of Total Wound Length: (cm) 1 Width: (cm) 1 Depth: (cm) 0.2 Volume: (cm) 0.157 Post Procedure Diagnosis Same as Pre-procedure Electronic Signature(s) Signed: 06/17/2015 11:54:03 AM By: Evlyn Kanner MD, FACS Signed: 06/17/2015 2:12:38 PM By: Elliot Gurney RN, BSN, Kim RN, BSN Entered By: Evlyn Kanner on 06/17/2015 11:54:03 [name]  ([id number]) -------------------------------------------------------------------------------- HPI Details Patient Name: [name] Date of Service: 06/17/2015 11:00 AM Medical Record Number: [id number] Patient Account Number: [id number] Date of Birth/Sex: 07-Nov-1920 (81 y.o. Female) Treating RN: Huel Coventry Primary Care Physician: Dorothey Baseman Other Clinician: Referring Physician: Dorothey Baseman Treating Physician/Extender: Rudene Re in Treatment: 18 History of Present Illness Location: right medial heel and right third toe Quality: Patient reports experiencing a dull pain to affected area(s). Severity: Patient states wound are getting worse. Duration: Patient has had the wound for > 3 months prior to seeking treatment  at the wound center Timing: Pain in wound is Intermittent (comes and goes Context: The wound appeared gradually over time Modifying Factors: Consults to this date include:vascular surgeon and several recent surgeries. Associated Signs and Symptoms: Patient reports having foul odor and drainage from the left below-knee amputation site. HPI Description: A pleasant 81 year old patient who is known to have diabetes mellitus for several years has recently gone through a series of operations with the vascular surgeon Dr. Gilda Crease. In January and February she had several surgeries on the left lower extremity with an attempt to have limb salvage for gangrenous changes of her forefoot. 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 On 01/07/2015 she also had a right lower extremity distal runoff with a angioplasty of the right anterior tibial artery to maximize her blood flow to the foot. She recently had her staples removed at Dr. Marijean Heath office on 01/31/2015 and at that time a right lower extremity duplex was done which showed patent vessels and a patent stent with distal occluded  posterior tibial artery. Though the duplex was noncritical the recommendations from the PA at the vascular surgery office was that of a angiogram to be done but the patient said she would rather try some bone care before. The patient has received doxycycline and Cipro in the recent past and she takes oral medications for her diabetes. Other than that I reviewed her list of all her medications. No recent hemoglobin A1c has been done and no recent x-rays of the right foot have been done. 02/18/2015 -- x-ray of the right foot was done on 02/11/2015 and it shows no evidence of acute osteomyelitis of the third toe or of the calcaneus. She had gone to the vascular surgery office and they had noted that there is dehiscence of the left part of the amputation site of the below-knee wound and they have asked [place] to kindly take over the care of this. There've been doing dressings for the right heel and right third toe. 02/25/2015 -- we have received notes from the vascular group who saw her last on 02/13/2015 and she was seen by the PA Ms. Cleda Daub. She had recommended that the patient continue to follow with [place] for wound care including management of the left BKA stump, which has had dehiscence of the lateral part. They will consider repeating a arterial duplex or angiogram if the right lower extremity does not heal within a reasonable period of time. 03/18/2015 -- he saw the vascular surgeon Dr. Gilda Crease and he has set her up for an angioplasty sometime in the middle of July. she is doing well otherwise. [name], [name] ([id number]) 04/10/2015 -- the patient had a procedure done on 04/08/2015 and this was a right angioplasty of the dorsalis pedis, anterior tibial artery, first superficial femoral artery in its midportion. There was successful intervention with recanalization and in-line flow to the right foot. 04/22/2015 -- the patient was looking rather pale today and the daughter confirms that  her hemoglobin was down to 6.9. she is being monitored by her PCP. Her Lasix dose was increased and her edema has gone down significantly. 04/29/2015 -- she had vascular studies done and the ABI on the right is 1.3 and the toe pressures were within normal limits. Her hemoglobin is still around 7 and she refuses to take any blood transfusions for religious reasons. Her potassium was normal. 06/10/2015 -- she has a new blister on her  right lateral and posterior part of her leg just in the region where the Kerlix bandages were applied and this may be due to an abrasion. Electronic Signature(s) Signed: 06/17/2015 11:54:17 AM By: Evlyn Kanner MD, FACS Entered By: Evlyn Kanner on 06/17/2015 11:54:17 [name] ([id number]) -------------------------------------------------------------------------------- Physical Exam Details Patient Name: [name] Date of Service: 06/17/2015 11:00 AM Medical Record Number: [id number] Patient Account Number: [id number] Date of Birth/Sex: September 13, 1921 (81 y.o. Female) Treating RN: Huel Coventry Primary Care Physician: Dorothey Baseman Other Clinician: Referring Physician: Dorothey Baseman Treating Physician/Extender: Rudene Re in Treatment: 18 Constitutional . Pulse regular. Respirations normal and unlabored. Afebrile. . Eyes Nonicteric. Reactive to light. Ears, Nose, Mouth, and Throat Lips, teeth, and gums WNL.Marland Kitchen Moist mucosa without lesions . Neck supple and nontender. No palpable supraclavicular or cervical adenopathy. Normal sized without goiter. Respiratory WNL. No retractions.. Cardiovascular Pedal Pulses WNL. No clubbing, cyanosis or edema. Lymphatic No adneopathy. No adenopathy. No adenopathy. Musculoskeletal Adexa without tenderness or enlargement.. Digits and nails w/o clubbing, cyanosis, infection, petechiae, ischemia, or inflammatory conditions.. Integumentary (Hair, Skin) No suspicious lesions. No crepitus or fluctuance. No  peri-wound warmth or erythema. No masses.Marland Kitchen Psychiatric Judgement and insight Intact.. No evidence of depression, anxiety, or agitation.. Notes The new blister on the right posterior part of her ankle has opened out into a full-fledged ulcer with slough and this was sharply debrided with a curette. The calcaneal wound is looking like it needs some debridement too. The left amputation stump is completely healed and looks good. Electronic Signature(s) Signed: 06/17/2015 11:55:19 AM By: Evlyn Kanner MD, FACS Entered By: Evlyn Kanner on 06/17/2015 11:55:18 [name] ([id number]) -------------------------------------------------------------------------------- Physician Orders Details Patient Name: [name] Date of Service: 06/17/2015 11:00 AM Medical Record Number: [id number] Patient Account Number: [id number] Date of Birth/Sex: Jan 11, 1921 (81 y.o. Female) Treating RN: Curtis Sites Primary Care Physician: Dorothey Baseman Other Clinician: Referring Physician: Dorothey Baseman Treating Physician/Extender: Rudene Re in Treatment: 43 Verbal / Phone Orders: Yes Clinician: Curtis Sites Read Back and Verified: Yes Diagnosis Coding Wound Cleansing Wound #1 Right Calcaneous o Cleanse wound with mild soap and water o May Shower, gently pat wound dry prior to applying new dressing. o May shower with protection. Wound #3 Left Amputation Site - Below Knee o Cleanse wound with mild soap and water o May Shower, gently pat wound dry prior to applying new dressing. o May shower with protection. Wound #6 Right,Posterior Lower Leg o Cleanse wound with mild soap and water o May Shower, gently pat wound dry prior to applying new dressing. o May shower with protection. Primary Wound Dressing Wound #1 Right Calcaneous o Aquacel Ag Wound #6 Right,Posterior Lower Leg o Aquacel Ag Secondary Dressing Wound #1 Right Calcaneous o ABD pad o Gauze and  Kerlix/Conform Wound #6 Right,Posterior Lower Leg o ABD pad o Gauze and Kerlix/Conform Wound #3 Left Amputation Site - Below Knee o ABD pad Dressing Change Frequency Vidana, Dyneisha ([id number]) Wound #1 Right Calcaneous o Change dressing every other day. Wound #3 Left Amputation Site - Below Knee o Change dressing every other day. Wound #6 Right,Posterior Lower Leg o Change dressing every other day. Follow-up Appointments Wound #1 Right Calcaneous o Return Appointment in 1 week. Wound #3 Left Amputation Site - Below Knee o Return Appointment in 1 week. Wound #6 Right,Posterior Lower Leg o Return Appointment in 1 week. Home Health Wound #1 Right Calcaneous o Continue Home Health Visits - [facility] o Home Health Nurse may visit PRN to address patientos wound  care needs. o FACE TO FACE ENCOUNTER: MEDICARE and MEDICAID PATIENTS: I certify that this patient is under my care and that I had a face-to-face encounter that meets the physician face-to-face encounter requirements with this patient on this date. The encounter with the patient was in whole or in part for the following MEDICAL CONDITION: (primary reason for Home Healthcare) MEDICAL NECESSITY: I certify, that based on my findings, NURSING services are a medically necessary home health service. HOME BOUND STATUS: I certify that my clinical findings support that this patient is homebound (i.e., Due to illness or injury, pt requires aid of supportive devices such as crutches, cane, wheelchairs, walkers, the use of special transportation or the assistance of another person to leave their place of residence. There is a normal inability to leave the home and doing so requires considerable and taxing effort. Other absences are for medical reasons / religious services and are infrequent or of short duration when for other reasons). o If current dressing causes regression in wound condition, may D/C ordered  dressing product/s and apply Normal Saline Moist Dressing daily until next Wound Healing Center / Other MD appointment. Notify Wound Healing Center of regression in wound condition at [phone]. o Please direct any NON-WOUND related issues/requests for orders to patient's Primary Care Physician Wound #3 Left Amputation Site - Below Knee o Continue Home Health Visits - [facility] o Home Health Nurse may visit PRN to address patientos wound care needs. o FACE TO FACE ENCOUNTER: MEDICARE and MEDICAID PATIENTS: I certify that this patient is under my care and that I had a face-to-face encounter that meets the physician face-to-face encounter requirements with this patient on this date. The encounter with the patient was in whole or in part for the following MEDICAL CONDITION: (primary reason for Home Healthcare) MEDICAL NECESSITY: I certify, that based on my findings, NURSING services are a medically [name], [name] ([id number]) necessary home health service. HOME BOUND STATUS: I certify that my clinical findings support that this patient is homebound (i.e., Due to illness or injury, pt requires aid of supportive devices such as crutches, cane, wheelchairs, walkers, the use of special transportation or the assistance of another person to leave their place of residence. There is a normal inability to leave the home and doing so requires considerable and taxing effort. Other absences are for medical reasons / religious services and are infrequent or of short duration when for other reasons). o If current dressing causes regression in wound condition, may D/C ordered dressing product/s and apply Normal Saline Moist Dressing daily until next Wound Healing Center / Other MD appointment. Notify Wound Healing Center of regression in wound condition at [phone]. o Please direct any NON-WOUND related issues/requests for orders to patient's Primary Care Physician Wound #6 Right,Posterior  Lower Leg o Continue Home Health Visits - [facility] o Home Health Nurse may visit PRN to address patientos wound care needs. o FACE TO FACE ENCOUNTER: MEDICARE and MEDICAID PATIENTS: I certify that this patient is under my care and that I had a face-to-face encounter that meets the physician face-to-face encounter requirements with this patient on this date. The encounter with the patient was in whole or in part for the following MEDICAL CONDITION: (primary reason for Home Healthcare) MEDICAL NECESSITY: I certify, that based on my findings, NURSING services are a medically necessary home health service. HOME BOUND STATUS: I certify that my clinical findings support that this patient is homebound (i.e., Due to illness or injury, pt requires aid of  supportive devices such as crutches, cane, wheelchairs, walkers, the use of special transportation or the assistance of another person to leave their place of residence. There is a normal inability to leave the home and doing so requires considerable and taxing effort. Other absences are for medical reasons / religious services and are infrequent or of short duration when for other reasons). o If current dressing causes regression in wound condition, may D/C ordered dressing product/s and apply Normal Saline Moist Dressing daily until next Wound Healing Center / Other MD appointment. Notify Wound Healing Center of regression in wound condition at [phone]. o Please direct any NON-WOUND related issues/requests for orders to patient's Primary Care Physician Electronic Signature(s) Signed: 06/17/2015 12:21:34 PM By: Curtis Sites Signed: 06/17/2015 1:13:39 PM By: Evlyn Kanner MD, FACS Entered By: Curtis Sites on 06/17/2015 11:48:16 [name] ([id number]) -------------------------------------------------------------------------------- Problem List Details Patient Name: [name] Date of Service: 06/17/2015 11:00 AM Medical  Record Number: [id number] Patient Account Number: [id number] Date of Birth/Sex: 09/30/1920 (81 y.o. 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 02/11/2015 Yes fat layer exposed L97.812 Non-pressure chronic ulcer of other part of right lower leg 02/11/2015 Yes with fat layer exposed T81.31XA Disruption of external operation (surgical) wound, not 02/18/2015 Yes elsewhere classified, initial encounter Inactive Problems Resolved Problems Electronic Signature(s) Signed: 06/17/2015 11:53:36 AM By: Evlyn Kanner MD, FACS Entered By: Evlyn Kanner on 06/17/2015 11:53:36 [name] ([id number]) -------------------------------------------------------------------------------- Progress Note Details Patient Name: [name] Date of Service: 06/17/2015 11:00 AM Medical Record Number: [id number] Patient Account Number: [id number] Date of Birth/Sex: 1921/08/16 (81 y.o. Female) Treating RN: Huel Coventry Primary Care Physician: Dorothey Baseman Other Clinician: Referring Physician: Dorothey Baseman Treating Physician/Extender: Rudene Re in Treatment: 18 Subjective Chief Complaint Information obtained from Patient Patient presents to the wound care center for a consult due non healing wound. 81 year old patient with comes with a history of a ulcerated area to the right third toe and the right heel which she's had for about 2 months. History of Present Illness (HPI) The  following HPI elements were documented for the patient's wound: Location: right medial heel and right third toe Quality: Patient reports experiencing a dull pain to affected area(s). Severity: Patient states wound are getting worse. Duration: Patient has had the wound for > 3 months prior to seeking treatment at the wound center Timing: Pain in wound is Intermittent (comes and goes Context: The wound appeared gradually over time Modifying Factors: Consults to this date include:vascular surgeon and several recent surgeries. Associated Signs and Symptoms: Patient reports having foul odor and drainage from the left below-knee amputation site. A pleasant 81 year old patient who is known to have diabetes mellitus for several years has recently gone through a series of operations with the vascular surgeon Dr. Gilda Crease. In January and February she had several surgeries on the left lower extremity with an attempt to have limb salvage for gangrenous changes of her forefoot. 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 On 01/07/2015 she also had a right  lower extremity distal runoff with a angioplasty of the right anterior tibial artery to maximize her blood flow to the foot. She recently had her staples removed at Dr. Marijean Heath office on 01/31/2015 and at that time a right lower extremity duplex was done which showed patent vessels and a patent stent with distal occluded posterior tibial artery. Though the duplex was noncritical the recommendations from the PA at the vascular surgery office was that of a angiogram to be done but the patient said she would rather try some bone care before. The patient has received doxycycline and Cipro in the recent past and she takes oral medications for her diabetes. Other than that I reviewed her list of all her medications. No recent hemoglobin A1c has been done and no recent x-rays of the  right foot have been done. 02/18/2015 -- x-ray of the right foot was done on 02/11/2015 and it shows no evidence of acute osteomyelitis of the third toe or of the calcaneus. She had gone to the vascular surgery office and they had noted that there is dehiscence of the left part of the amputation site of the below-knee wound and they have asked [place] to kindly take over the care of this. There've been doing dressings for the right heel and right third toe. [name], [name] ([id number]) 02/25/2015 -- we have received notes from the vascular group who saw her last on 02/13/2015 and she was seen by the PA Ms. Cleda Daub. She had recommended that the patient continue to follow with [place] for wound care including management of the left BKA stump, which has had dehiscence of the lateral part. They will consider repeating a arterial duplex or angiogram if the right lower extremity does not heal within a reasonable period of time. 03/18/2015 -- he saw the vascular surgeon Dr. Gilda Crease and he has set her up for an angioplasty sometime in the middle of July. she is doing well otherwise. 04/10/2015 -- the patient had a procedure done on 04/08/2015 and this was a right angioplasty of the dorsalis pedis, anterior tibial artery, first superficial femoral artery in its midportion. There was successful intervention with recanalization and in-line flow to the right foot. 04/22/2015 -- the patient was looking rather pale today and the daughter confirms that her hemoglobin was down to 6.9. she is being monitored by her PCP. Her Lasix dose was increased and her edema has gone down significantly. 04/29/2015 -- she had vascular studies done and the ABI on the right is 1.3 and the toe pressures were within normal limits. Her hemoglobin is still around 7 and she refuses to take any blood transfusions for religious reasons. Her potassium was normal. 06/10/2015 -- she has a new blister on her right lateral and posterior  part of her leg just in the region where the Kerlix bandages were applied and this may be due to an abrasion. Objective Constitutional Pulse regular. Respirations normal and unlabored. Afebrile. Vitals Time Taken: 11:20 AM, Height: 62 in, Weight: 155 lbs, BMI: 28.3, Temperature: 97.8 F, Pulse: 53 bpm, Respiratory Rate: 18 breaths/min, Blood Pressure: 110/50 mmHg. Eyes Nonicteric. Reactive to light. Ears, Nose, Mouth, and Throat Lips, teeth, and gums WNL.Marland Kitchen Moist mucosa without lesions . Neck supple and nontender. No palpable supraclavicular or cervical adenopathy. Normal sized without goiter. Respiratory WNL. No retractions.Marland Kitchen Salido, Brettney ([id number]) Cardiovascular Pedal Pulses WNL. No clubbing, cyanosis or edema. Lymphatic No adneopathy. No adenopathy. No adenopathy. Musculoskeletal Adexa without tenderness or enlargement.. Digits and nails w/o clubbing, cyanosis, infection,  petechiae, ischemia, or inflammatory conditions.Marland Kitchen Psychiatric Judgement and insight Intact.. No evidence of depression, anxiety, or agitation.. General Notes: The new blister on the right posterior part of her ankle has opened out into a full-fledged ulcer with slough and this was sharply debrided with a curette. The calcaneal wound is looking like it needs some debridement too. The left amputation stump is completely healed and looks good. Integumentary (Hair, Skin) No suspicious lesions. No crepitus or fluctuance. No peri-wound warmth or erythema. No masses.. Wound #1 status is Open. Original cause of wound was Gradually Appeared. The wound is located on the Right Calcaneous. The wound measures 1.2cm length x 1.4cm width x 0.2cm depth; 1.319cm^2 area and 0.264cm^3 volume. Wound #3 status is Open. Original cause of wound was Surgical Injury. The wound is located on the Left Amputation Site - Below Knee. The wound measures 0.2cm length x 0.4cm width x 0.21cm depth; 0.063cm^2 area and 0.013cm^3  volume. Wound #6 status is Open. Original cause of wound was Blister. The wound is located on the Right,Posterior Lower Leg. The wound measures 1cm length x 1cm width x 0.1cm depth; 0.785cm^2 area and 0.079cm^3 volume. The wound is limited to skin breakdown. There is a small amount of serosanguineous drainage noted. The wound margin is indistinct and nonvisible. There is no granulation within the wound bed. There is a large (67-100%) amount of necrotic tissue within the wound bed including Adherent Slough. The periwound skin appearance did not exhibit: Callus, Crepitus, Excoriation, Fluctuance, Friable, Induration, Localized Edema, Rash, Scarring, Dry/Scaly, Maceration, Moist, Atrophie Blanche, Cyanosis, Ecchymosis, Hemosiderin Staining, Mottled, Pallor, Rubor, Erythema. 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 This may have contributed to her blisters. I will use silver alginate on the right lower extremity and a protective dressing on the left BKA stump. She will come back and see me next week. 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 There was a Skin/Subcutaneous Tissue Debridement (44010-27253) debridement with total area of 1.68 sq cm performed by Tristan Schroeder., MD. with the following instrument(s): Curette to remove Viable and  Non-Viable tissue/material including Fibrin/Slough and Subcutaneous after achieving pain control using Lidocaine 4% Topical Solution. A time out was conducted prior to the start of the procedure. A Minimum amount of bleeding was controlled with Pressure. The procedure was tolerated well with a pain level of 0 throughout and a pain level of 0 following the procedure. Post Debridement Measurements: 1.2cm length x 1.4cm width x 0.2cm depth; 0.264cm^3 volume. Post procedure Diagnosis Wound #1: Same as Pre-Procedure Wound #6 Wound #6 is an Arterial Insufficiency Ulcer located on the Right,Posterior Lower Leg . There was a Skin/Subcutaneous Tissue Debridement ([id number]) debridement with total area of 1 sq cm performed by Tristan Schroeder., MD. with the following instrument(s): Curette to remove Viable and Non-Viable tissue/material including Fibrin/Slough and Subcutaneous after achieving pain control using Lidocaine 4% Topical Solution. A time out was  conducted prior to the start of the procedure. A Minimum amount of bleeding was controlled with Pressure. The procedure was tolerated well with a pain level of 0 throughout and a pain level of 0 following the procedure. Post Debridement Measurements: 1cm length x 1cm width x 0.2cm depth; 0.157cm^3 volume. Post procedure Diagnosis Wound #6: Same as Pre-Procedure Plan Wound Cleansing: Wound #1 Right Calcaneous: [name], [name] ([id number]) Cleanse wound with mild soap and water May Shower, gently pat wound dry prior to applying new dressing. May shower with protection. Wound #3 Left Amputation Site - Below Knee: Cleanse wound with mild soap and water May Shower, gently pat wound dry prior to applying new dressing. May shower with protection. Wound #6 Right,Posterior Lower Leg: Cleanse wound with mild soap and water May Shower, gently pat wound dry prior to applying new dressing. May shower with protection. Primary Wound Dressing: Wound #1  Right Calcaneous: Aquacel Ag Wound #6 Right,Posterior Lower Leg: Aquacel Ag Secondary Dressing: Wound #1 Right Calcaneous: ABD pad Gauze and Kerlix/Conform Wound #6 Right,Posterior Lower Leg: ABD pad Gauze and Kerlix/Conform Wound #3 Left Amputation Site - Below Knee: ABD pad Dressing Change Frequency: Wound #1 Right Calcaneous: Change dressing every other day. Wound #3 Left Amputation Site - Below Knee: Change dressing every other day. Wound #6 Right,Posterior Lower Leg: Change dressing every other day. Follow-up Appointments: Wound #1 Right Calcaneous: Return Appointment in 1 week. 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 Wound #6 Right,Posterior Lower Leg: Return Appointment in 1 week. Home Health: Wound #1 Right Calcaneous: Continue Home Health Visits - [facility] Nurse may visit PRN to address patient s wound care needs. FACE TO FACE ENCOUNTER: MEDICARE and MEDICAID PATIENTS: I certify that this patient is under my care and that I had a face-to-face encounter that meets the physician face-to-face encounter requirements with this patient on this date. The encounter with the patient was in whole or in part for the following MEDICAL CONDITION: (primary reason for Home Healthcare) MEDICAL NECESSITY: I certify, that based on my findings, NURSING services are a medically necessary home health service. HOME BOUND STATUS: I certify that my clinical findings support that this patient is homebound (i.e., Due to illness or injury, pt requires aid of supportive devices such as crutches, cane, wheelchairs, walkers, the use of special transportation or the assistance of another person to leave their place of residence. There is a [name], [name] ([id number]) normal inability to leave the home and doing so requires considerable and taxing effort. Other absences are for medical reasons / religious services and are infrequent or of short duration  when for other reasons). If current dressing causes regression in wound condition, may D/C ordered dressing product/s and apply Normal Saline Moist Dressing daily until next Wound Healing Center / Other MD appointment. Notify Wound Healing Center of regression in wound condition at [phone]. Please direct any NON-WOUND related issues/requests for orders to patient's Primary Care Physician Wound #3 Left Amputation Site - Below Knee: Continue Home Health Visits - [facility] Nurse may visit PRN to address patient s wound care needs. FACE TO FACE ENCOUNTER: MEDICARE and MEDICAID PATIENTS: I certify that this patient is under my care and that I had a face-to-face encounter that meets the physician face-to-face encounter requirements with this patient on this date. The encounter with the patient was in whole or in part for the following MEDICAL CONDITION: (primary reason for Home Healthcare) MEDICAL NECESSITY: I certify, that  based on my findings, NURSING services are a medically necessary home health service. HOME BOUND STATUS: I certify that my clinical findings support that this patient is homebound (i.e., Due to illness or injury, pt requires aid of supportive devices such as crutches, cane, wheelchairs, walkers, the use of special transportation or the assistance of another person to leave their place of residence. There is a normal inability to leave the home and doing so requires considerable and taxing effort. Other absences are for medical reasons / religious services and are infrequent or of short duration when for other reasons). If current dressing causes regression in wound condition, may D/C ordered dressing product/s and apply Normal Saline Moist Dressing daily until next Wound Healing Center / Other MD appointment. Notify Wound Healing Center of regression in wound condition at [phone]. Please direct any NON-WOUND related issues/requests for orders to patient's  Primary Care Physician Wound #6 Right,Posterior Lower Leg: Continue Home Health Visits - [facility] Nurse may visit PRN to address patient s wound care needs. FACE TO FACE ENCOUNTER: MEDICARE and MEDICAID PATIENTS: I certify that this patient is under my care and that I had a face-to-face encounter that meets the physician face-to-face encounter requirements with this patient on this date. The encounter with the patient was in whole or in part for the following MEDICAL CONDITION: (primary reason for Home Healthcare) MEDICAL NECESSITY: I certify, that based on my findings, NURSING services are a medically necessary home health service. HOME BOUND STATUS: I certify that my clinical findings support that this patient is homebound (i.e., Due to illness or injury, pt requires aid of supportive devices such as crutches, cane, wheelchairs, walkers, the use of special transportation or the assistance of another person to leave their place of residence. There is a normal inability to leave the home and doing so requires considerable and taxing effort. Other absences are for medical reasons / religious services and are infrequent or of short duration when for other reasons). If current dressing causes regression in wound condition, may D/C ordered dressing product/s and apply Normal Saline Moist Dressing daily until next Wound Healing Center / Other MD appointment. Notify Wound Healing Center of regression in wound condition at [phone]. Please direct any NON-WOUND related issues/requests for orders to patient's Primary Care Physician She is told to stop all over-the-counter medications that she was taking some weight reducing medicines. This may have contributed to her blisters. I will use silver alginate on the right lower extremity and a protective dressing on the left BKA stump. [name], [name] ([id number]) She will come back and see me next week. Electronic Signature(s) Signed:  06/17/2015 11:56:33 AM By: Evlyn Kanner MD, FACS Entered By: Evlyn Kanner on 06/17/2015 11:56:33 [name] ([id number]) -------------------------------------------------------------------------------- SuperBill Details Patient Name: [name] Date of Service: 06/17/2015 Medical Record Number: [id number] Patient Account Number: [id number] Date of Birth/Sex: 04-26-1921 (81 y.o. 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 L97.812 Non-pressure chronic ulcer of other part of right lower leg with fat layer exposed Disruption of external operation (surgical) wound, not elsewhere classified, initial T81.31XA encounter Facility Procedures CPT4: Description  Modifier Quantity Code [id number] 11042 - DEB SUBQ TISSUE 20 SQ CM/< 1 ICD-10 Description Diagnosis E11.621 Type 2 diabetes mellitus with foot ulcer I70.235 Atherosclerosis of native arteries of right leg with ulceration of other part of  foot 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 L97.812 Non-pressure chronic ulcer of other part of right lower leg with fat layer exposed Physician Procedures CPT4: Description Modifier Quantity Code 5409811 11042 - WC PHYS SUBQ TISS 20 SQ CM 1 ICD-10 Description Diagnosis E11.621 Type 2 diabetes mellitus with foot ulcer I70.235 Atherosclerosis of native arteries of right leg with ulceration of other part of  foot 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  L97.812 Non-pressure chronic ulcer of other part of right lower leg with fat layer exposed [name], [name] ([id number]) Electronic Signature(s) Signed: 06/17/2015 11:57:08 AM By: Evlyn Kanner MD, FACS Entered By: Evlyn Kanner on 06/17/2015 11:57:08

## 2015-06-17 NOTE — Progress Notes (Signed)
[name], [name] ([id number]) Visit Report for 06/17/2015 Arrival Information Details Patient Name: [name] [name], [name] [name] Date of Service: 06/17/2015 11:00 AM Medical Record Number: [id number] Patient Account Number: [id number] Date of Birth/Sex: Feb 11, 1921 (81 y.o. Female) Treating RN: Huel Coventry Primary Care Physician: Dorothey Baseman Other Clinician: Referring Physician: Dorothey Baseman Treating Physician/Extender: Rudene Re in Treatment: 18 Visit Information History Since Last Visit Added or deleted any medications: No Patient Arrived: Wheel Chair Any new allergies or adverse reactions: No Arrival Time: 11:15 Had a fall or experienced change in No Accompanied By: daughter in activities of daily living that may affect law risk of falls: Transfer Assistance: Manual Signs or symptoms of abuse/neglect since last No Patient Identification Verified: Yes visito Secondary Verification Process Yes Hospitalized since last visit: No Completed: Has Dressing in Place as Prescribed: Yes Patient Has Alerts: Yes Pain Present Now: No Patient Alerts: DMII Electronic Signature(s) Signed: 06/17/2015 2:12:38 PM By: Elliot Gurney, RN, BSN, Kim RN, BSN Entered By: Elliot Gurney, RN, BSN, Kim on 06/17/2015 11:17:36 [name] [name] ([id number]) -------------------------------------------------------------------------------- Encounter Discharge Information Details Patient Name: [name] [name] Date of Service: 06/17/2015 11:00 AM Medical Record Number: [id number] Patient Account Number: [id number] Date of Birth/Sex: [birth year] (81 y.o. Female) Treating RN: Huel Coventry Primary Care Physician: Dorothey Baseman Other Clinician: Referring Physician: Dorothey Baseman Treating Physician/Extender: Rudene Re in Treatment: 59 Encounter Discharge Information Items Discharge Pain Level: 0 Discharge Condition: Stable Ambulatory Status: Wheelchair Discharge Destination: Home Transportation: Private  Auto Accompanied By: self Schedule Follow-up Appointment: Yes Medication Reconciliation completed and provided to Patient/Care Yes [name] [name]: Provided on Clinical Summary of Care: 06/17/2015 Form Type Recipient Paper Patient HS Electronic Signature(s) Signed: 06/17/2015 2:12:38 PM By: Elliot Gurney RN, BSN, Kim RN, BSN Previous Signature: 06/17/2015 11:59:47 AM Version By: Gwenlyn Perking Previous Signature: 06/17/2015 11:57:35 AM Version By: Gwenlyn Perking Entered By: Elliot Gurney RN, BSN, Kim on 06/17/2015 12:03:33 [name] [name] ([id number]) -------------------------------------------------------------------------------- Lower Extremity Assessment Details Patient Name: [name] [name] Date of Service: 06/17/2015 11:00 AM Medical Record Number: [id number] Patient Account Number: [id number] Date of Birth/Sex: 05-Sep-1921 (81 y.o. Female) Treating RN: Huel Coventry Primary Care Physician: Dorothey Baseman Other Clinician: Referring Physician: Dorothey Baseman Treating Physician/Extender: Rudene Re in Treatment: 18 Vascular Assessment Pulses: Posterior Tibial Dorsalis Pedis Palpable: [Right:No] Doppler: [Right:Monophasic] Extremity colors, hair growth, and conditions: Extremity Color: [Right:Pale] Hair Growth on Extremity: [Right:Yes] Temperature of Extremity: [Right:Warm] Capillary Refill: [Right:< 3 seconds] Toe Nail Assessment [name]: Right: Thick: Yes Discolored: Yes Deformed: No Improper Length and Hygiene: No Electronic Signature(s) Signed: 06/17/2015 2:12:38 PM By: Elliot Gurney, RN, BSN, Kim RN, BSN Entered By: Elliot Gurney, RN, BSN, Kim on 06/17/2015 11:24:59 [name] [name] ([id number]) -------------------------------------------------------------------------------- Multi Wound Chart Details Patient Name: [name] [name] Date of Service: 06/17/2015 11:00 AM Medical Record Number: [id number] Patient Account Number: [id number] Date of Birth/Sex: 1921/04/23 (81 y.o. 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] Comorbid History: [1:N/A] [3:N/A] [6:Arrhythmia, Deep Vein Thrombosis, Peripheral Arterial Disease, Type II Diabetes] Date Acquired: [1:11/04/2014] [3:01/03/2015] [6:06/12/2015] Weeks of Treatment: [1:18] [3:17] [6:0] Wound Status: [1:Open] [3:Open] [6:Open] Measurements L x W x  D 1.2x1.4x0.2 [3:0.2x0.4x0.21] [6:1x1x0.1] (cm) Area (cm) : [1:1.319] [3:0.063] [6:0.785] Volume (cm) : [1:0.264] [3:0.013] [6:0.079] % Reduction in Area: [1:76.00%] [3:99.40%] [6:0.00%] % Reduction in Volume: 52.00% [3:99.70%] [6:0.00%] Classification: [1:Full Thickness Without Exposed Support Structures] [3:Full Thickness Without Exposed Support Structures] [6:Full Thickness Without Exposed Support Structures] HBO Classification: [1:N/A] [3:N/A] [6:Grade 2] Exudate Amount: [1:N/A] [3:N/A] [6:Small] Exudate Type: [1:N/A] [3:N/A] [6:Serosanguineous] Exudate Color: [1:N/A] [3:N/A] [6:red, brown] Wound Margin: [1:N/A] [3:N/A] [6:Indistinct, nonvisible] Granulation Amount: [1:N/A] [3:N/A] [6:None Present (0%)] Necrotic Amount: [1:N/A] [3:N/A] [6:Large (67-100%)] Periwound Skin Texture: No Abnormalities Noted [3:No Abnormalities Noted] [6:Edema: No Excoriation: No] Induration: No Callus: No Crepitus: No Fluctuance: No Friable: No Rash:  No Scarring: No Periwound Skin No Abnormalities Noted No Abnormalities Noted Maceration: No Moisture: Moist: No Dry/Scaly: No Periwound Skin Color: No Abnormalities Noted No Abnormalities Noted Atrophie Blanche: No Cyanosis: No Ecchymosis: No Erythema: No Hemosiderin Staining: No Mottled: No Pallor: No Rubor: No Tenderness on No No No Palpation: Wound Preparation: N/A N/A Ulcer Cleansing: Other Topical Anesthetic Applied: Other: lidocaine4% Treatment Notes Electronic Signature(s) Signed: 06/17/2015 12:21:34 PM By: Curtis Sites Entered By: Curtis Sites on 06/17/2015 11:38:02 [name] [name] ([id number]) -------------------------------------------------------------------------------- Multi-Disciplinary Care Plan Details Patient Name: [name] [name] Date of Service: 06/17/2015 11:00 AM Medical Record Number: [id number] Patient Account Number: [id number] Date of Birth/Sex: [birth year] (81 y.o. Female) Treating RN: Curtis Sites Primary Care Physician: Dorothey Baseman Other Clinician: Referring Physician: Dorothey Baseman Treating Physician/Extender: Rudene Re in Treatment: 95 Active Inactive Abuse / Safety / Falls / Self Care Management Nursing Diagnoses: Impaired physical mobility Potential for falls Goals: Patient will remain injury free Date Initiated: 02/11/2015 Goal Status: Active Patient/caregiver will verbalize understanding of skin care regimen Date Initiated: 02/11/2015 Goal Status: Active Patient/caregiver will verbalize/demonstrate measures taken to prevent injury and/or falls Date Initiated: 02/11/2015 Goal Status: Active Patient/caregiver will verbalize/demonstrate understanding of what to do in case of emergency Date Initiated: 02/11/2015 Goal Status: Active Interventions: Assess fall risk on admission and as needed Provide education on fall prevention Provide education on safe transfers Treatment Activities: Patient referred to home care  : 06/17/2015 Notes: Nutrition Nursing Diagnoses: Imbalanced nutrition Potential for alteratiion in Nutrition/Potential for imbalanced nutrition Kinney, Vantasia ([id number]) Goals: Patient/caregiver verbalizes understanding of need to maintain therapeutic glucose control per primary care physician Date Initiated: 02/11/2015 Goal Status: Active Patient/caregiver will maintain therapeutic glucose control Date Initiated: 02/11/2015 Goal Status: Active Interventions: Assess HgA1c results as ordered upon admission and as needed Provide education on elevated blood sugars and impact on wound healing Provide education on nutrition Treatment Activities: Education provided on Nutrition : 04/22/2015 Obtain HgA1c : 06/17/2015 Notes: Wound/Skin Impairment Nursing Diagnoses: Impaired tissue integrity Knowledge deficit related to ulceration/compromised skin integrity Goals: Patient/caregiver will verbalize understanding of skin care regimen Date Initiated: 02/11/2015 Goal Status: Active Ulcer/skin breakdown will heal within 14 weeks Date Initiated: 02/11/2015 Goal Status: Active Interventions: Assess patient/caregiver ability to obtain necessary supplies Assess patient/caregiver ability to perform ulcer/skin care regimen upon admission and as needed Assess ulceration(s) every visit Provide education on ulcer and skin care Treatment Activities: Skin care regimen initiated : 06/17/2015 Topical wound management initiated : 06/17/2015 Notes: KAMALI, SAKATA ([id number]) Electronic Signature(s) Signed: 06/17/2015 12:21:34 PM By: Curtis Sites Entered By: Curtis Sites on 06/17/2015 11:37:53 [name] [name] ([id number]) -------------------------------------------------------------------------------- Pain Assessment Details Patient Name: [name] [name] Date of Service: 06/17/2015 11:00 AM Medical Record Number: [id number] Patient Account Number: [id number] Date of Birth/Sex: [birth year] (81 y.o.  Female) Treating RN: Huel Coventry Primary Care Physician: Terance Hart, DAVID  Other Clinician: Referring Physician: Terance Hart, DAVID Treating Physician/Extender: Rudene Re in Treatment: 18 Active Problems Location of Pain Severity and Description of Pain Patient Has Paino No Site Locations Pain Management and Medication Current Pain Management: Electronic Signature(s) Signed: 06/17/2015 2:12:38 PM By: Elliot Gurney, RN, BSN, Kim RN, BSN Entered By: Elliot Gurney, RN, BSN, Kim on 06/17/2015 11:18:46 [name] [name] ([id number]) -------------------------------------------------------------------------------- Patient/Caregiver Education Details Patient Name: [name] [name] Date of Service: 06/17/2015 11:00 AM Medical Record Number: [id number] Patient Account Number: [id number] Date of Birth/Gender: [birth year] (81 y.o. Female) Treating RN: Huel Coventry Primary Care Physician: Dorothey Baseman Other Clinician: Referring Physician: Dorothey Baseman Treating Physician/Extender: Rudene Re in Treatment: 35 Education Assessment Education Provided To: Patient Education Topics Provided Wound/Skin Impairment: Handouts: Caring for Your Ulcer, Other: continue wound care as prescribed Methods: Demonstration, Explain/Verbal Responses: State content correctly Electronic Signature(s) Signed: 06/17/2015 2:12:38 PM By: Elliot Gurney, RN, BSN, Kim RN, BSN Entered By: Elliot Gurney, RN, BSN, Kim on 06/17/2015 12:04:06 [name] [name] ([id number]) -------------------------------------------------------------------------------- Wound Assessment Details Patient Name: [name] [name] Date of Service: 06/17/2015 11:00 AM Medical Record Number: [id number] Patient Account Number: [id number] Date of Birth/Sex: 1920-11-08 (81 y.o. Female) Treating RN: Huel Coventry Primary Care Physician: Dorothey Baseman Other Clinician: Referring Physician: Dorothey Baseman Treating Physician/Extender: Rudene Re in Treatment:  18 Wound Status Wound Number: 1 Primary Etiology: Arterial Insufficiency Ulcer Wound Location: Right Calcaneous Wound Status: Open Wounding Event: Gradually Appeared Date Acquired: 11/04/2014 Weeks Of Treatment: 18 Clustered Wound: No Photos Photo Uploaded By: Elliot Gurney, RN, BSN, Kim on 06/17/2015 17:30:00 Wound Measurements Length: (cm) 1.2 Width: (cm) 1.4 Depth: (cm) 0.2 Area: (cm) 1.319 Volume: (cm) 0.264 % Reduction in Area: 76% % Reduction in Volume: 52% Wound Description Full Thickness Without Exposed Classification: Support Structures Periwound Skin Texture Texture Color No Abnormalities Noted: No No Abnormalities Noted: No Moisture No Abnormalities Noted: No Treatment Notes Wound #1 (Right Calcaneous) [name] [name], [name] [name] ([id number]) 1. Cleansed with: Clean wound with Normal Saline 2. Anesthetic Topical Lidocaine 4% cream to wound bed prior to debridement 4. Dressing Applied: Aquacel Ag 5. Secondary Dressing Applied Gauze and Kerlix/Conform 7. Secured with Secretary/administrator) Signed: 06/17/2015 2:12:38 PM By: Elliot Gurney, RN, BSN, Kim RN, BSN Entered By: Elliot Gurney, RN, BSN, Kim on 06/17/2015 11:32:46 [name] [name] ([id number]) -------------------------------------------------------------------------------- Wound Assessment Details Patient Name: [name] [name] Date of Service: 06/17/2015 11:00 AM Medical Record Number: [id number] Patient Account Number: [id number] Date of Birth/Sex: 07/28/1921 (81 y.o. Female) Treating RN: Huel Coventry Primary Care Physician: Dorothey Baseman Other Clinician: Referring Physician: Dorothey Baseman Treating Physician/Extender: Rudene Re in Treatment: 18 Wound Status Wound Number: 3 Primary Etiology: Dehisced Wound Wound Location: [name] Amputation Site - Below Wound Status: Open Knee Wounding Event: Surgical Injury Date Acquired: 01/03/2015 Weeks Of Treatment: 17 Clustered Wound: No Photos Photo Uploaded By: Elliot Gurney,  RN, BSN, Kim on 06/17/2015 17:30:00 Wound Measurements Length: (cm) 0.2 Width: (cm) 0.4 Depth: (cm) 0.21 Area: (cm) 0.063 Volume: (cm) 0.013 % Reduction in Area: 99.4% % Reduction in Volume: 99.7% Wound Description Full Thickness Without Exposed Classification: Support Structures Periwound Skin Texture Texture Color No Abnormalities Noted: No No Abnormalities Noted: No Moisture No Abnormalities Noted: No Treatment Notes [name] [name], [name] [name] ([id number]) Wound #3 ([name] Amputation Site - Below Knee) 2. Anesthetic Topical Lidocaine 4% cream to wound bed prior to debridement 5. Secondary Dressing Applied ABD Pad 7. Secured with Magazine features editor) Signed: 06/17/2015 2:12:38 PM By: Elliot Gurney, RN, BSN, Kim RN, BSN Entered By: Elliot Gurney, RN, BSN, Kim on 06/17/2015 11:32:46 [name] [name], [name] [name] ([id number]) --------------------------------------------------------------------------------  Wound Assessment Details Patient Name: [name] [name], [name] [name] Date of Service: 06/17/2015 11:00 AM Medical Record Number: [id number] Patient Account Number: [id number] Date of Birth/Sex: 1921/03/28 (81 y.o. Female) Treating RN: Huel Coventry Primary Care Physician: Dorothey Baseman Other Clinician: Referring Physician: Dorothey Baseman Treating Physician/Extender: Rudene Re in Treatment: 18 Wound Status Wound Number: 6 Primary Arterial Insufficiency Ulcer Etiology: Wound Location: Right Lower Leg - Posterior Wound Open Wounding Event: Blister Status: Date Acquired: 06/12/2015 Comorbid Arrhythmia, Deep Vein Thrombosis, Weeks Of Treatment: 0 History: Peripheral Arterial Disease, Type II Clustered Wound: No Diabetes Photos Photo Uploaded By: Elliot Gurney, RN, BSN, Kim on 06/17/2015 17:30:01 Wound Measurements Length: (cm) 1 Width: (cm) 1 Depth: (cm) 0.1 Area: (cm) 0.785 Volume: (cm) 0.079 % Reduction in Area: 0% % Reduction in Volume: 0% Wound Description Full Thickness Without Exposed  Foul Odor Af Classification: Support Structures Diabetic Severity Grade 2 (Wagner): Wound Margin: Indistinct, nonvisible Exudate Amount: Small Exudate Type: Serosanguineous Exudate Color: red, brown ter Cleansing: No Wound Bed Granulation Amount: None Present (0%) Exposed Structure [name] [name], [name] [name] ([id number]) Necrotic Amount: Large (67-100%) Fascia Exposed: No Necrotic Quality: Adherent Slough Fat Layer Exposed: No Tendon Exposed: No Muscle Exposed: No Joint Exposed: No Bone Exposed: No Limited to Skin Breakdown Periwound Skin Texture Texture Color No Abnormalities Noted: No No Abnormalities Noted: No Callus: No Atrophie Blanche: No Crepitus: No Cyanosis: No Excoriation: No Ecchymosis: No Fluctuance: No Erythema: No Friable: No Hemosiderin Staining: No Induration: No Mottled: No Localized Edema: No Pallor: No Rash: No Rubor: No Scarring: No Moisture No Abnormalities Noted: No Dry / Scaly: No Maceration: No Moist: No Wound Preparation Ulcer Cleansing: Other Topical Anesthetic Applied: Other: lidocaine4%, Treatment Notes Wound #6 (Right, Posterior Lower Leg) 1. Cleansed with: Clean wound with Normal Saline 2. Anesthetic Topical Lidocaine 4% cream to wound bed prior to debridement 4. Dressing Applied: Aquacel Ag 5. Secondary Dressing Applied Gauze and Kerlix/Conform 7. Secured with Secretary/administrator) Signed: 06/17/2015 2:12:38 PM By: Elliot Gurney, RN, BSN, Kim RN, BSN Entered By: Elliot Gurney, RN, BSN, Kim on 06/17/2015 11:32:21 [name] [name] ([id number]) [name] [name], [name] [name] ([id number]) -------------------------------------------------------------------------------- Vitals Details Patient Name: [name] [name] Date of Service: 06/17/2015 11:00 AM Medical Record Number: [id number] Patient Account Number: [id number] Date of Birth/Sex: 03-05-1921 (81 y.o. Female) Treating RN: Huel Coventry Primary Care Physician: Dorothey Baseman Other Clinician: Referring  Physician: Dorothey Baseman Treating Physician/Extender: Rudene Re in Treatment: 18 Vital Signs Time Taken: 11:20 Temperature (F): 97.8 Height (in): 62 Pulse (bpm): 53 Weight (lbs): 155 Respiratory Rate (breaths/min): 18 Body Mass Index (BMI): 28.3 Blood Pressure (mmHg): 110/50 Reference Range: 80 - 120 mg / dl Electronic Signature(s) Signed: 06/17/2015 2:12:38 PM By: Elliot Gurney, RN, BSN, Kim RN, BSN Entered By: Elliot Gurney, RN, BSN, Kim on 06/17/2015 11:21:36

## 2015-06-24 ENCOUNTER — Encounter: Payer: Medicare Other | Admitting: Surgery

## 2015-06-24 DIAGNOSIS — L97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: Secondary | ICD-10-CM | POA: Diagnosis not present

## 2015-06-24 NOTE — Progress Notes (Signed)
[name], [name] ([id number]) Visit Report for 06/24/2015 Chief Complaint Document Details Patient Name: [name], [name] Date of Service: 06/24/2015 10:15 AM Medical Record Number: [id number] Patient Account Number: [id number] Date of Birth/Sex: [birth year] (81 y.o. Female) Treating RN: Curtis Sites Primary Care Physician: Dorothey Baseman Other Clinician: Referring Physician: Dorothey Baseman Treating Physician/Extender: Rudene Re in Treatment: 73 Information Obtained from: Patient Chief Complaint Patient presents to the wound care center for a consult due non healing wound. 81 year old patient with comes with a history of a ulcerated area to the right third toe and the right heel which she's had for about 2 months. Electronic Signature(s) Signed: 06/24/2015 10:51:14 AM By: Evlyn Kanner MD, FACS Entered By: Evlyn Kanner on 06/24/2015 10:51:14 [name] ([id number]) -------------------------------------------------------------------------------- HPI Details Patient Name: [name] Date of Service: 06/24/2015 10:15 AM Medical Record Number: [id number] Patient Account Number: [id number] Date of Birth/Sex: 11-17-1920 (81 y.o. Female) Treating RN: Curtis Sites Primary Care Physician: Dorothey Baseman Other Clinician: Referring Physician: Dorothey Baseman Treating Physician/Extender: Rudene Re in Treatment: 19 History of Present Illness Location: right medial heel and right third toe Quality: Patient reports experiencing a dull pain to affected area(s). Severity: Patient states wound are getting worse. Duration: Patient has had the wound for > 3 months prior to seeking treatment at the wound center Timing: Pain in wound is Intermittent (comes and goes Context: The wound appeared gradually over time Modifying Factors: Consults to this date include:vascular surgeon and several recent surgeries. Associated Signs and Symptoms: Patient reports having foul odor  and drainage from the left below-knee amputation site. HPI Description: A pleasant 81 year old patient who is known to have diabetes mellitus for several years has recently gone through a series of operations with the vascular surgeon Dr. Gilda Crease. In January and February she had several surgeries on the left lower extremity with an attempt to have limb salvage for gangrenous changes of her forefoot. 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 On 01/07/2015 she also had a right lower extremity distal runoff with a angioplasty of the right anterior tibial artery to maximize her blood flow to the foot. She recently had her staples removed at Dr. Marijean Heath office on 01/31/2015 and at that time a right lower extremity duplex was done which showed patent vessels and a patent stent with distal occluded posterior tibial artery. Though the duplex was noncritical the recommendations from the PA at the vascular surgery office was that of a angiogram to be done but the patient said she would rather try some bone care before. The patient has received doxycycline and Cipro in the recent past and she takes oral medications for her diabetes. Other than that I reviewed her list of all her medications. No recent hemoglobin A1c has been done and no recent x-rays of the right foot have been done. 02/18/2015 -- x-ray of the right foot was done on 02/11/2015 and it shows no evidence of acute osteomyelitis of the third toe or of the calcaneus. She had gone to the vascular surgery office and they had noted that there is dehiscence of the left part of the amputation site of the below-knee wound and they have asked [place] to kindly take over the care of this. There've been doing dressings for the right heel and right third toe. 02/25/2015 -- we have received notes from the vascular group who saw her last on 02/13/2015 and she was seen by the PA Ms.  Cleda Daub. She had recommended that the patient continue to follow with [place] for wound care including management of the left BKA stump, which has had dehiscence of the lateral part. They will consider repeating a arterial duplex or angiogram if the right lower extremity does not heal within a reasonable period of time. 03/18/2015 -- he saw the vascular surgeon Dr. Gilda Crease and he has set her up for an angioplasty sometime in the middle of July. she is doing well otherwise. [name], [name] ([id number]) 04/10/2015 -- the patient had a procedure done on 04/08/2015 and this was a right angioplasty of the dorsalis pedis, anterior tibial artery, first superficial femoral artery in its midportion. There was successful intervention with recanalization and in-line flow to the right foot. 04/22/2015 -- the patient was looking rather pale today and the daughter confirms that her hemoglobin was down to 6.9. she is being monitored by her PCP. Her Lasix dose was increased and her edema has gone down significantly. 04/29/2015 -- she had vascular studies done and the ABI on the right is 1.3 and the toe pressures were within normal limits. Her hemoglobin is still around 7 and she refuses to take any blood transfusions for religious reasons. Her potassium was normal. 06/10/2015 -- she has a new blister on her right lateral and posterior part of her leg just in the region where the Kerlix bandages were applied and this may be due to an abrasion. 06/24/2015 -- On her right lower extremity she has developed several more blisters which look like small pustules and the drain informed shallow ulcerations. 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 Electronic Signature(s) Signed: 06/24/2015 10:52:36 AM By: Evlyn Kanner MD, FACS Entered By: Evlyn Kanner on 06/24/2015 10:52:36 [name] ([id number]) -------------------------------------------------------------------------------- Physical Exam Details Patient  Name: [name] Date of Service: 06/24/2015 10:15 AM Medical Record Number: [id number] Patient Account Number: [id number] Date of Birth/Sex: Oct 10, 1920 (81 y.o. Female) Treating RN: Curtis Sites Primary Care Physician: Dorothey Baseman Other Clinician: Referring Physician: Dorothey Baseman Treating Physician/Extender: Rudene Re in Treatment: 19 Constitutional . Pulse regular. Respirations normal and unlabored. Afebrile. . Eyes Nonicteric. Reactive to light. Ears, Nose, Mouth, and Throat Lips, teeth, and gums WNL.Marland Kitchen Moist mucosa without lesions . Neck supple and nontender. No palpable supraclavicular or cervical adenopathy. Normal sized without goiter. Respiratory WNL. No retractions.. Breath sounds WNL, No rubs, rales, rhonchi, or wheeze.. Cardiovascular Heart rhythm and rate regular, no murmur or gallop.. Pedal Pulses WNL. No clubbing, cyanosis or edema. Chest Breasts symmetical and no nipple discharge.. Breast tissue WNL, no masses, lumps, or tenderness.. Gastrointestinal (GI) Abdomen without masses or tenderness.. No liver or spleen enlargement or tenderness.. Genitourinary (GU) No hydrocele, spermatocele, tenderness of the cord, or testicular mass.Marland Kitchen Penis without lesions.Renetta Chalk without lesions. No cystocele, or rectocele. Pelvic support intact, no discharge. Marland Kitchen Urethra without masses, tenderness or scarring.Marland Kitchen Lymphatic No adneopathy. No adenopathy. No adenopathy. Musculoskeletal Adexa without tenderness or enlargement.. Digits and nails w/o clubbing, cyanosis, infection, petechiae, ischemia, or inflammatory conditions.. Integumentary (Hair, Skin) No suspicious lesions. 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 No evidence of depression, anxiety, or agitation.. Notes Her original wounds are looking very good and the left lower extremity stump has completely  healed. The right-sided ulceration is looking good on the calcaneum but these furuncles have now opened out to form shallow ulcerations. [name], [name] ([id number]) Electronic Signature(s) Signed: 06/24/2015 10:54:36 AM By: Evlyn Kanner MD, FACS  Entered By: Evlyn Kanner on 06/24/2015 10:54:36 [name] ([id number]) -------------------------------------------------------------------------------- Physician Orders Details Patient Name: [name] Date of Service: 06/24/2015 10:15 AM Medical Record Number: [id number] Patient Account Number: [id number] Date of Birth/Sex: 05-15-1921 (81 y.o. Female) Treating RN: Curtis Sites Primary Care Physician: Dorothey Baseman Other Clinician: Referring Physician: Dorothey Baseman Treating Physician/Extender: Rudene Re in Treatment: 58 Verbal / Phone Orders: Yes Clinician: Curtis Sites Read Back and Verified: Yes Diagnosis Coding Wound Cleansing Wound #1 Right Calcaneous o Cleanse wound with mild soap and water o May Shower, gently pat wound dry prior to applying new dressing. o May shower with protection. Wound #6 Right,Posterior Lower Leg o Cleanse wound with mild soap and water o May Shower, gently pat wound dry prior to applying new dressing. o May shower with protection. Wound #7 Right,Anterior Lower Leg o Cleanse wound with mild soap and water o May Shower, gently pat wound dry prior to applying new dressing. o May shower with protection. Wound #8 Right Toe Fourth o Cleanse wound with mild soap and water o May Shower, gently pat wound dry prior to applying new dressing. o May shower with protection. Primary Wound Dressing Wound #1 Right Calcaneous o Aquacel Ag Wound #6 Right,Posterior Lower Leg o Aquacel Ag Wound #7 Right,Anterior Lower Leg o Santyl Ointment Wound #8 Right Toe Fourth o Aquacel Ag Secondary Dressing Wound #1 Right Calcaneous o ABD pad [place], Naiara  ([id number]) o Gauze and Kerlix/Conform Wound #6 Right,Posterior Lower Leg o ABD pad o Gauze and Kerlix/Conform Wound #7 Right,Anterior Lower Leg o ABD pad o Gauze and Kerlix/Conform Wound #8 Right Toe Fourth o ABD pad o Gauze and Kerlix/Conform Dressing Change Frequency Wound #1 Right Calcaneous o Change dressing every other day. Wound #6 Right,Posterior Lower Leg o Change dressing every other day. Wound #7 Right,Anterior Lower Leg o Change dressing every other day. Wound #8 Right Toe Fourth o Change dressing every other day. Follow-up Appointments Wound #1 Right Calcaneous o Return Appointment in 1 week. Wound #6 Right,Posterior Lower Leg o Return Appointment in 1 week. Wound #7 Right,Anterior Lower Leg o Return Appointment in 1 week. Wound #8 Right Toe Fourth o Return Appointment in 1 week. Home Health Wound #1 Right Calcaneous o Continue Home Health Visits - [facility] o Home Health Nurse may visit PRN to address patientos wound care needs. o FACE TO FACE ENCOUNTER: MEDICARE and MEDICAID PATIENTS: I certify that this patient is under my care and that I had a face-to-face encounter that meets the physician face-to-face encounter requirements with this patient on this date. The encounter with the patient was in whole or in part for the following MEDICAL CONDITION: (primary reason for Home Healthcare) [name], [name] ([id number]) MEDICAL NECESSITY: I certify, that based on my findings, NURSING services are a medically necessary home health service. HOME BOUND STATUS: I certify that my clinical findings support that this patient is homebound (i.e., Due to illness or injury, pt requires aid of supportive devices such as crutches, cane, wheelchairs, walkers, the use of special transportation or the assistance of another person to leave their place of residence. There is a normal inability to leave the home and doing so requires considerable and  taxing effort. Other absences are for medical reasons / religious services and are infrequent or of short duration when for other reasons). o If current dressing causes regression in wound condition, may D/C ordered dressing product/s and apply Normal Saline Moist Dressing daily until next Wound Healing Center / Other MD appointment.  Notify Wound Healing Center of regression in wound condition at [phone]. o Please direct any NON-WOUND related issues/requests for orders to patient's Primary Care Physician Wound #6 Right,Posterior Lower Leg o Continue Home Health Visits - [facility] o Home Health Nurse may visit PRN to address patientos wound care needs. o FACE TO FACE ENCOUNTER: MEDICARE and MEDICAID PATIENTS: I certify that this patient is under my care and that I had a face-to-face encounter that meets the physician face-to-face encounter requirements with this patient on this date. The encounter with the patient was in whole or in part for the following MEDICAL CONDITION: (primary reason for Home Healthcare) MEDICAL NECESSITY: I certify, that based on my findings, NURSING services are a medically necessary home health service. HOME BOUND STATUS: I certify that my clinical findings support that this patient is homebound (i.e., Due to illness or injury, pt requires aid of supportive devices such as crutches, cane, wheelchairs, walkers, the use of special transportation or the assistance of another person to leave their place of residence. There is a normal inability to leave the home and doing so requires considerable and taxing effort. Other absences are for medical reasons / religious services and are infrequent or of short duration when for other reasons). o If current dressing causes regression in wound condition, may D/C ordered dressing product/s and apply Normal Saline Moist Dressing daily until next Wound Healing Center / Other MD appointment. Notify Wound Healing  Center of regression in wound condition at [phone]. o Please direct any NON-WOUND related issues/requests for orders to patient's Primary Care Physician Wound #7 Right,Anterior Lower Leg o Continue Home Health Visits - [facility] o Home Health Nurse may visit PRN to address patientos wound care needs. o FACE TO FACE ENCOUNTER: MEDICARE and MEDICAID PATIENTS: I certify that this patient is under my care and that I had a face-to-face encounter that meets the physician face-to-face encounter requirements with this patient on this date. The encounter with the patient was in whole or in part for the following MEDICAL CONDITION: (primary reason for Home Healthcare) MEDICAL NECESSITY: I certify, that based on my findings, NURSING services are a medically necessary home health service. HOME BOUND STATUS: I certify that my clinical findings support that this patient is homebound (i.e., Due to illness or injury, pt requires aid of supportive devices such as crutches, cane, wheelchairs, walkers, the use of special transportation or the assistance of another person to leave their place of residence. There is a normal inability to leave the home and doing so requires considerable and taxing effort. Other absences are for medical reasons / religious services and are infrequent or of short duration when for other reasons). [name], [name] ([id number]) o If current dressing causes regression in wound condition, may D/C ordered dressing product/s and apply Normal Saline Moist Dressing daily until next Wound Healing Center / Other MD appointment. Notify Wound Healing Center of regression in wound condition at [phone]. o Please direct any NON-WOUND related issues/requests for orders to patient's Primary Care Physician Wound #8 Right Toe Fourth o Continue Home Health Visits - [facility] o Home Health Nurse may visit PRN to address patientos wound care needs. o FACE TO FACE ENCOUNTER:  MEDICARE and MEDICAID PATIENTS: I certify that this patient is under my care and that I had a face-to-face encounter that meets the physician face-to-face encounter requirements with this patient on this date. The encounter with the patient was in whole or in part for the following MEDICAL CONDITION: (primary reason for  Home Healthcare) MEDICAL NECESSITY: I certify, that based on my findings, NURSING services are a medically necessary home health service. HOME BOUND STATUS: I certify that my clinical findings support that this patient is homebound (i.e., Due to illness or injury, pt requires aid of supportive devices such as crutches, cane, wheelchairs, walkers, the use of special transportation or the assistance of another person to leave their place of residence. There is a normal inability to leave the home and doing so requires considerable and taxing effort. Other absences are for medical reasons / religious services and are infrequent or of short duration when for other reasons). o If current dressing causes regression in wound condition, may D/C ordered dressing product/s and apply Normal Saline Moist Dressing daily until next Wound Healing Center / Other MD appointment. Notify Wound Healing Center of regression in wound condition at [phone]. o Please direct any NON-WOUND related issues/requests for orders to patient's Primary Care Physician Medications-please add to medication list. Wound #1 Right Calcaneous o P.O. Antibiotics - doxycycline Wound #6 Right,Posterior Lower Leg o P.O. Antibiotics - doxycycline Wound #7 Right,Anterior Lower Leg o P.O. Antibiotics - doxycycline o Santyl Enzymatic Ointment Wound #8 Right Toe Fourth o P.O. Antibiotics - doxycycline Patient Medications Allergies: cephalexin, tramadol, amoxicillin, prednisolone Notifications Medication Indication Start End doxycycline hyclate 06/24/2015 DOSE 1 - oral 100 mg capsule - 1 capsule oral  bid [name], [name] ([id number]) Electronic Signature(s) Signed: 06/24/2015 10:47:34 AM By: Evlyn Kanner MD, FACS Entered By: Evlyn Kanner on 06/24/2015 10:47:33 [name] ([id number]) -------------------------------------------------------------------------------- Problem List Details Patient Name: [name] Date of Service: 06/24/2015 10:15 AM Medical Record Number: [id number] Patient Account Number: [id number] Date of Birth/Sex: 1920-10-26 (81 y.o. 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 02/11/2015 Yes fat layer exposed L97.812 Non-pressure chronic ulcer of other part of right lower leg 02/11/2015 Yes with fat layer exposed T81.31XA Disruption of external operation (surgical) wound, not 02/18/2015 Yes elsewhere classified, initial encounter Inactive Problems Resolved Problems Electronic Signature(s) Signed: 06/24/2015 10:51:05 AM By: Evlyn Kanner MD, FACS Entered By: Evlyn Kanner on 06/24/2015 10:51:04 [name] ([id number]) -------------------------------------------------------------------------------- Progress Note Details Patient Name: [name] Date of Service: 06/24/2015 10:15 AM Medical Record Number: [id number] Patient Account Number: [id number] Date of Birth/Sex: 06-25-1921 (81 y.o. Female) Treating RN: Curtis Sites Primary Care Physician: Dorothey Baseman Other Clinician: Referring Physician: Dorothey Baseman Treating  Physician/Extender: Rudene Re in Treatment: 42 Subjective Chief Complaint Information obtained from Patient Patient presents to the wound care center for a consult due non healing wound. 81 year old patient with comes with a history of a ulcerated area to the right third toe and the right heel which she's had for about 2 months. History of Present Illness (HPI) The following HPI elements were documented for the patient's wound: Location: right medial heel and right third toe Quality: Patient reports experiencing a dull pain to affected area(s). Severity: Patient states wound are getting worse. Duration: Patient has had the wound for > 3 months prior to seeking treatment at the wound center Timing: Pain in wound is Intermittent (comes and goes Context:  The wound appeared gradually over time Modifying Factors: Consults to this date include:vascular surgeon and several recent surgeries. Associated Signs and Symptoms: Patient reports having foul odor and drainage from the left below-knee amputation site. A pleasant 81 year old patient who is known to have diabetes mellitus for several years has recently gone through a series of operations with the vascular surgeon Dr. Gilda Crease. In January and February she had several surgeries on the left lower extremity with an attempt to have limb salvage for gangrenous changes of her forefoot. 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 On 01/07/2015 she also had a right lower extremity distal runoff with a angioplasty of the right anterior tibial artery to maximize her blood flow to the foot. She recently had her staples removed at Dr. Marijean Heath office on 01/31/2015 and at that time a right lower extremity duplex was done which showed patent vessels and a patent stent with distal occluded posterior tibial artery. Though the duplex was noncritical the recommendations from  the PA at the vascular surgery office was that of a angiogram to be done but the patient said she would rather try some bone care before. The patient has received doxycycline and Cipro in the recent past and she takes oral medications for her diabetes. Other than that I reviewed her list of all her medications. No recent hemoglobin A1c has been done and no recent x-rays of the right foot have been done. 02/18/2015 -- x-ray of the right foot was done on 02/11/2015 and it shows no evidence of acute osteomyelitis of the third toe or of the calcaneus. She had gone to the vascular surgery office and they had noted that there is dehiscence of the left part of the amputation site of the below-knee wound and they have asked [place] to kindly take over the care of this. There've been doing dressings for the right heel and right third toe. [name], [name] ([id number]) 02/25/2015 -- we have received notes from the vascular group who saw her last on 02/13/2015 and she was seen by the PA Ms. Cleda Daub. She had recommended that the patient continue to follow with [place] for wound care including management of the left BKA stump, which has had dehiscence of the lateral part. They will consider repeating a arterial duplex or angiogram if the right lower extremity does not heal within a reasonable period of time. 03/18/2015 -- he saw the vascular surgeon Dr. Gilda Crease and he has set her up for an angioplasty sometime in the middle of July. she is doing well otherwise. 04/10/2015 -- the patient had a procedure done on 04/08/2015 and this was a right angioplasty of the dorsalis pedis, anterior tibial artery, first superficial femoral artery in its midportion. There was successful intervention with recanalization and in-line flow to the right foot. 04/22/2015 -- the patient was looking rather pale today and the daughter confirms that her hemoglobin was down to 6.9. she is being monitored by her PCP. Her Lasix dose was  increased and her edema has gone down significantly. 04/29/2015 -- she had vascular studies done and the ABI on the right is 1.3 and the toe pressures were within normal limits. Her hemoglobin is still around 7 and she refuses to take any blood transfusions for religious reasons. Her potassium was normal. 06/10/2015 -- she has a new blister on her right lateral and posterior part of her leg just in the region where the Kerlix bandages  were applied and this may be due to an abrasion. 06/24/2015 -- On her right lower extremity she has developed several more blisters which look like small pustules and the drain informed shallow ulcerations. 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 Objective Constitutional Pulse regular. Respirations normal and unlabored. Afebrile. Vitals Time Taken: 10:22 AM, Height: 62 in, Weight: 155 lbs, BMI: 28.3, Temperature: 97.7 F, Pulse: 55 bpm, Respiratory Rate: 18 breaths/min, Blood Pressure: 135/40 mmHg. Eyes Nonicteric. Reactive to light. Ears, Nose, Mouth, and Throat Lips, teeth, and gums WNL.Marland Kitchen Moist mucosa without lesions . Neck supple and nontender. No palpable supraclavicular or cervical adenopathy. Normal sized without goiter. Respiratory [name], [name] ([id number]) WNL. No retractions.. Breath sounds WNL, No rubs, rales, rhonchi, or wheeze.. Cardiovascular Heart rhythm and rate regular, no murmur or gallop.. Pedal Pulses WNL. No clubbing, cyanosis or edema. Chest Breasts symmetical and no nipple discharge.. Breast tissue WNL, no masses, lumps, or tenderness.. Gastrointestinal (GI) Abdomen without masses or tenderness.. No liver or spleen enlargement or tenderness.. Genitourinary (GU) No hydrocele, spermatocele, tenderness of the cord, or testicular mass.Marland Kitchen Penis without lesions.Renetta Chalk without lesions. No cystocele, or rectocele. Pelvic support intact, no discharge. Marland Kitchen Urethra without masses, tenderness or scarring.Marland Kitchen Lymphatic No adneopathy. No  adenopathy. No adenopathy. Musculoskeletal Adexa without tenderness or enlargement.. Digits and nails w/o clubbing, cyanosis, infection, petechiae, ischemia, or inflammatory conditions.Marland Kitchen Psychiatric Judgement and insight Intact.. No evidence of depression, anxiety, or agitation.. General Notes: Her original wounds are looking very good and the left lower extremity stump has completely healed. The right-sided ulceration is looking good on the calcaneum but these furuncles have now opened out to form shallow ulcerations. Integumentary (Hair, Skin) No suspicious lesions. she has developed multiple furunculosis on her right lower extremity some of them are very tender.. Wound #1 status is Open. Original cause of wound was Gradually Appeared. The wound is located on the Right Calcaneous. The wound measures 1.4cm length x 1.5cm width x 0.1cm depth; 1.649cm^2 area and 0.165cm^3 volume. Wound #3 status is Open. Original cause of wound was Surgical Injury. The wound is located on the Left Amputation Site - Below Knee. The wound measures 0cm length x 0cm width x 0cm depth; 0cm^2 area and 0cm^3 volume. Wound #6 status is Open. Original cause of wound was Blister. The wound is located on the Right,Posterior Lower Leg. The wound measures 0.7cm length x 1cm width x 0.1cm depth; 0.55cm^2 area and 0.055cm^3 volume. Wound #7 status is Open. Original cause of wound was Gradually Appeared. The wound is located on the Right,Anterior Lower Leg. The wound measures 0.5cm length x 0.6cm width x 0.2cm depth; 0.236cm^2 area and 0.047cm^3 volume. The wound is limited to skin breakdown. There is a none present amount of drainage noted. The wound margin is flat and intact. There is no granulation within the wound bed. There is [name], [name] ([id number]) a large (67-100%) amount of necrotic tissue within the wound bed including Adherent Slough. The periwound skin appearance exhibited: Erythema. The periwound skin  appearance did not exhibit: Callus, Crepitus, Excoriation, Fluctuance, Friable, Induration, Localized Edema, Rash, Scarring, Dry/Scaly, Maceration, Moist, Atrophie Blanche, Cyanosis, Ecchymosis, Hemosiderin Staining, Mottled, Pallor, Rubor. The surrounding wound skin color is noted with erythema which is circumferential. Wound #8 status is Open. Original cause of wound was Gradually Appeared. The wound is located on the Right Toe Fourth. The wound measures 0.3cm length x 0.3cm width x 0.1cm depth; 0.071cm^2 area and 0.007cm^3 volume. The wound is limited to skin breakdown. There is a none  present amount of drainage noted. The wound margin is flat and intact. There is no granulation within the wound bed. There is a large (67-100%) amount of necrotic tissue within the wound bed including Eschar. The periwound skin appearance exhibited: Erythema. The periwound skin appearance did not exhibit: Callus, Crepitus, Excoriation, Fluctuance, Friable, Induration, Localized Edema, Rash, Scarring, Dry/Scaly, Maceration, Moist, Atrophie Blanche, Cyanosis, Ecchymosis, Hemosiderin Staining, Mottled, Pallor, Rubor. The surrounding wound skin color is noted with erythema which is circumferential. 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 I have given her doxycycline 100 mg twice  a day for 14 days. She will come back and see me next week Plan Wound Cleansing: Wound #1 Right Calcaneous: Cleanse wound with mild soap and water May Shower, gently pat wound dry prior to applying new dressing. [name], [name] ([id number]) May shower with protection. Wound #6 Right,Posterior Lower Leg: Cleanse wound with mild soap and water May Shower, gently pat wound dry prior to applying new dressing. May shower with protection. Wound #7 Right,Anterior Lower Leg: Cleanse wound with mild soap and water May Shower, gently pat wound dry prior to applying new dressing. May shower with protection. Wound #8 Right Toe Fourth: Cleanse wound with mild soap and water May Shower, gently pat wound dry prior to applying new dressing. May shower with protection. Primary Wound Dressing: Wound #1 Right Calcaneous: Aquacel Ag Wound #6 Right,Posterior Lower Leg: Aquacel Ag Wound #7 Right,Anterior Lower Leg: Santyl Ointment Wound #8 Right Toe Fourth: Aquacel Ag Secondary Dressing: Wound #1 Right Calcaneous: ABD pad Gauze and Kerlix/Conform Wound #6 Right,Posterior Lower Leg: ABD pad Gauze and Kerlix/Conform Wound #7 Right,Anterior Lower Leg: ABD pad Gauze and Kerlix/Conform Wound #8 Right Toe Fourth: ABD pad Gauze and Kerlix/Conform Dressing Change Frequency: Wound #1 Right Calcaneous: Change dressing every other day. Wound #6 Right,Posterior Lower Leg: Change dressing every other day. Wound #7 Right,Anterior Lower Leg: Change dressing every other day. Wound #8 Right Toe Fourth: Change dressing every other day. Follow-up Appointments: Wound #1 Right Calcaneous: Return Appointment in 1 week. Wound #6 Right,Posterior Lower Leg: Return Appointment in 1 week. Wound #7 Right,Anterior Lower Leg: Return Appointment in 1 week. [name], [name] ([id number]) Wound #8 Right Toe Fourth: Return Appointment in 1 week. Home Health: Wound #1 Right Calcaneous: Continue Home Health Visits  - [facility] Nurse may visit PRN to address patient s wound care needs. FACE TO FACE ENCOUNTER: MEDICARE and MEDICAID PATIENTS: I certify that this patient is under my care and that I had a face-to-face encounter that meets the physician face-to-face encounter requirements with this patient on this date. The encounter with the patient was in whole or in part for the following MEDICAL CONDITION: (primary reason for Home Healthcare) MEDICAL NECESSITY: I certify, that based on my findings, NURSING services are a medically necessary home health service. HOME BOUND STATUS: I certify that my clinical findings support that  this patient is homebound (i.e., Due to illness or injury, pt requires aid of supportive devices such as crutches, cane, wheelchairs, walkers, the use of special transportation or the assistance of another person to leave their place of residence. There is a normal inability to leave the home and doing so requires considerable and taxing effort. Other absences are for medical reasons / religious services and are infrequent or of short duration when for other reasons). If current dressing causes regression in wound condition, may D/C ordered dressing product/s and apply Normal Saline Moist Dressing daily until next Wound Healing Center / Other MD appointment. Notify Wound Healing Center of regression in wound condition at [phone]. Please direct any NON-WOUND related issues/requests for orders to patient's Primary Care Physician Wound #6 Right,Posterior Lower Leg: Continue Home Health Visits - [facility] Nurse may visit PRN to address patient s wound care needs. FACE TO FACE ENCOUNTER: MEDICARE and MEDICAID PATIENTS: I certify that this patient is under my care and that I had a face-to-face encounter that meets the physician face-to-face encounter requirements with this patient on this date. The encounter with the patient was in whole or in part for  the following MEDICAL CONDITION: (primary reason for Home Healthcare) MEDICAL NECESSITY: I certify, that based on my findings, NURSING services are a medically necessary home health service. HOME BOUND STATUS: I certify that my clinical findings support that this patient is homebound (i.e., Due to illness or injury, pt requires aid of supportive devices such as crutches, cane, wheelchairs, walkers, the use of special transportation or the assistance of another person to leave their place of residence. There is a normal inability to leave the home and doing so requires considerable and taxing effort. Other absences are for medical reasons / religious services and are infrequent or of short duration when for other reasons). If current dressing causes regression in wound condition, may D/C ordered dressing product/s and apply Normal Saline Moist Dressing daily until next Wound Healing Center / Other MD appointment. Notify Wound Healing Center of regression in wound condition at [phone]. Please direct any NON-WOUND related issues/requests for orders to patient's Primary Care Physician Wound #7 Right,Anterior Lower Leg: Continue Home Health Visits - [facility] Nurse may visit PRN to address patient s wound care needs. FACE TO FACE ENCOUNTER: MEDICARE and MEDICAID PATIENTS: I certify that this patient is under my care and that I had a face-to-face encounter that meets the physician face-to-face encounter requirements with this patient on this date. The encounter with the patient was in whole or in part for the following MEDICAL CONDITION: (primary reason for Home Healthcare) MEDICAL NECESSITY: I certify, that based on my findings, NURSING services are a medically necessary home health service. HOME BOUND STATUS: I certify that my clinical findings support that this patient is homebound (i.e., Due to illness or injury, pt requires aid of supportive devices such as crutches, cane,  wheelchairs, walkers, the use of special transportation or the assistance of another person to leave their place of residence. There is a normal inability to leave the home and doing so requires considerable and taxing effort. Other absences are for medical reasons / religious services and are infrequent or of short duration when for other reasons). If current dressing causes regression in wound condition, may D/C ordered dressing product/s and apply [name], [name] ([id number]) Normal Saline Moist Dressing daily until next Wound Healing Center / Other MD appointment. Notify Wound Healing Center of regression in wound condition at  [phone]. Please direct any NON-WOUND related issues/requests for orders to patient's Primary Care Physician Wound #8 Right Toe Fourth: Continue Home Health Visits - [facility] Nurse may visit PRN to address patient s wound care needs. FACE TO FACE ENCOUNTER: MEDICARE and MEDICAID PATIENTS: I certify that this patient is under my care and that I had a face-to-face encounter that meets the physician face-to-face encounter requirements with this patient on this date. The encounter with the patient was in whole or in part for the following MEDICAL CONDITION: (primary reason for Home Healthcare) MEDICAL NECESSITY: I certify, that based on my findings, NURSING services are a medically necessary home health service. HOME BOUND STATUS: I certify that my clinical findings support that this patient is homebound (i.e., Due to illness or injury, pt requires aid of supportive devices such as crutches, cane, wheelchairs, walkers, the use of special transportation or the assistance of another person to leave their place of residence. There is a normal inability to leave the home and doing so requires considerable and taxing effort. Other absences are for medical reasons / religious services and are infrequent or of short duration when for other reasons). If current  dressing causes regression in wound condition, may D/C ordered dressing product/s and apply Normal Saline Moist Dressing daily until next Wound Healing Center / Other MD appointment. Notify Wound Healing Center of regression in wound condition at [phone]. Please direct any NON-WOUND related issues/requests for orders to patient's Primary Care Physician Medications-please add to medication list.: Wound #1 Right Calcaneous: P.O. Antibiotics - doxycycline Wound #6 Right,Posterior Lower Leg: P.O. Antibiotics - doxycycline Wound #7 Right,Anterior Lower Leg: P.O. Antibiotics - doxycycline Santyl Enzymatic Ointment Wound #8 Right Toe Fourth: P.O. Antibiotics - doxycycline The following medication(s) was prescribed: doxycycline hyclate oral 100 mg capsule 1 1 capsule oral bid starting 06/24/2015 We will use silver alginate on most of the wounds and one particular area on the right anterior part that she has an ulceration with slough we will use Santyl ointment. I have given her doxycycline 100 mg twice a day for 14 days. She will come back and see me next week. Electronic Signature(s) Signed: 06/24/2015 10:56:00 AM By: Evlyn Kanner MD, FACS [place], [place] ([id number]) Entered By: Evlyn Kanner on 06/24/2015 10:55:59 [name] ([id number]) -------------------------------------------------------------------------------- SuperBill Details Patient Name: [name] Date of Service: 06/24/2015 Medical Record Number: [id number] Patient Account Number: [id number] Date of Birth/Sex: 02/02/1921 (81 y.o. 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 L97.812 Non-pressure chronic ulcer of other part of right lower leg with fat layer exposed Disruption of external operation (surgical) wound, not elsewhere classified, initial T81.31XA encounter Facility Procedures CPT4 Code: [id number] Description: 99214 - WOUND CARE VISIT-LEV 4 EST PT Modifier: Quantity: 1 Physician Procedures CPT4: Description Modifier Quantity Code [id number] 99213 - WC PHYS LEVEL 3 - EST PT 1 ICD-10 Description Diagnosis E11.621 Type 2 diabetes mellitus with foot ulcer I70.235 Atherosclerosis of native arteries of right leg with ulceration of other part of  foot 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

## 2015-06-25 NOTE — Progress Notes (Signed)
[name] ([id number]) Visit Report for 06/24/2015 Arrival Information Details Patient Name: [name], [name] Date of Service: 06/24/2015 10:15 AM Medical Record Number: [id number] Patient Account Number: [id number] Date of Birth/Sex: 01-Apr-1921 (81 y.o. Female) Treating RN: [name] Primary Care Physician: [name] Other Clinician: Referring Physician: Dorothey [name] Treating Physician/Extender: [name] in Treatment: 19 Visit Information History Since Last Visit Added or deleted any medications: No Patient Arrived: Wheel Chair Any new allergies or adverse reactions: No Arrival Time: 10:15 Had a fall or experienced change in No Accompanied By: daughter in activities of daily living that may affect law risk of falls: Transfer Assistance: Manual Signs or symptoms of abuse/neglect since last No Patient Identification Verified: Yes visito Secondary Verification Process Yes Has Dressing in Place as Prescribed: Yes Completed: Pain Present Now: No Patient Has Alerts: Yes Patient Alerts: DMII Electronic Signature(s) Signed: 06/24/2015 5:26:34 PM By: [name] Gurney, RN, [name], [name] Entered By: [name] Gurney, RN, [name], [name] on 06/24/2015 10:16:19 [name] ([id number]) -------------------------------------------------------------------------------- Clinic Level of Care Assessment Details Patient Name: [name] Date of Service: 06/24/2015 10:15 AM Medical Record Number: [id number] Patient Account Number: [id number] Date of Birth/Sex: Jan 29, 1921 (81 y.o. Female) Treating RN: [name] Primary Care Physician: [name] Other Clinician: Referring Physician: Terance Hart, [name] Treating Physician/Extender: [name] in Treatment: 19 Clinic Level of Care Assessment Items TOOL 4 Quantity Score  - Use when only an EandM is performed on FOLLOW-UP visit 0 ASSESSMENTS - Nursing Assessment / Reassessment X - Reassessment of Co-morbidities (includes  updates in patient status) 1 10 X - Reassessment of Adherence to Treatment Plan 1 5 ASSESSMENTS - Wound and Skin Assessment / Reassessment  - Simple Wound Assessment / Reassessment - one wound 0 X - Complex Wound Assessment / Reassessment - multiple wounds 4 5  - Dermatologic / Skin Assessment (not related to wound area) 0 ASSESSMENTS - Focused Assessment X - Circumferential Edema Measurements - multi extremities 1 5  - Nutritional Assessment / Counseling / Intervention 0 X - Lower Extremity Assessment (monofilament, tuning fork, pulses) 1 5  - Peripheral Arterial Disease Assessment (using hand held doppler) 0 ASSESSMENTS - Ostomy and/or Continence Assessment and Care  - Incontinence Assessment and Management 0  - Ostomy Care Assessment and Management (repouching, etc.) 0 PROCESS - Coordination of Care X - Simple Patient / Family Education for ongoing care 1 15  - Complex (extensive) Patient / Family Education for ongoing care 0  - Staff obtains Chiropractor, Records, Test Results / Process Orders 0  - Staff telephones HHA, Nursing Homes / Clarify orders / etc 0  - Routine Transfer to another Facility (non-emergent condition) 0 [name] ([id number])  - Routine Hospital Admission (non-emergent condition) 0  - New Admissions / Manufacturing engineer / Ordering NPWT, Apligraf, etc. 0  - Emergency Hospital Admission (emergent condition) 0 X - Simple Discharge Coordination 1 10  - Complex (extensive) Discharge Coordination 0 PROCESS - Special Needs  - Pediatric / Minor Patient Management 0  - Isolation Patient Management 0  - Hearing / Language / Visual special needs 0  - Assessment of Community assistance (transportation, D/C planning, etc.) 0  - Additional assistance / Altered mentation 0  - Support Surface(s) Assessment (bed, cushion, seat, etc.) 0 INTERVENTIONS - Wound Cleansing / Measurement  - Simple Wound Cleansing - one wound 0 X -  Complex Wound Cleansing - multiple wounds 4 5 X - Wound Imaging (photographs - any number of wounds) 1 5  - Wound Tracing (  instead of photographs) 0 []  - Simple Wound Measurement - one wound 0 X - Complex Wound Measurement - multiple wounds 4 5 INTERVENTIONS - Wound Dressings X - Small Wound Dressing one or multiple wounds 4 10 []  - Medium Wound Dressing one or multiple wounds 0 []  - Large Wound Dressing one or multiple wounds 0 []  - Application of Medications - topical 0 []  - Application of Medications - injection 0 INTERVENTIONS - Miscellaneous []  - External ear exam 0 [name] ([id number]) []  - Specimen Collection (cultures, biopsies, blood, body fluids, etc.) 0 []  - Specimen(s) / Culture(s) sent or taken to Lab for analysis 0 []  - Patient Transfer (multiple staff / Michiel [name] Lift / Similar devices) 0 []  - Simple Staple / Suture removal (25 or less) 0 []  - Complex Staple / Suture removal (26 or more) 0 []  - Hypo / Hyperglycemic Management (close monitor of Blood Glucose) 0 []  - Ankle / Brachial Index (ABI) - do not check if billed separately 0 []  - Vital Signs 0 Has the patient been seen at the hospital within the last three years: Yes Total Score: 155 Level Of Care: New/Established - Level 4 Electronic Signature(s) Signed: 06/24/2015 4:21:27 PM By: [name] Entered By: [name] on 06/24/2015 10:45:35 [name] ([id number]) -------------------------------------------------------------------------------- Encounter Discharge Information Details Patient Name: [name] Date of Service: 06/24/2015 10:15 AM Medical Record Number: [id number] Patient Account Number: [id number] Date of Birth/Sex: [birth year] (81 y.o. Female) Treating RN: [name] Primary Care Physician: [name] Other Clinician: Referring Physician: Dorothey [name] Treating Physician/Extender: [name] in Treatment: 55 Encounter Discharge Information Items Discharge Pain  Level: 0 Discharge Condition: Stable Ambulatory Status: Wheelchair Discharge Destination: Home Transportation: Private Auto daughter in Accompanied By: law Schedule Follow-up Appointment: Yes Medication Reconciliation completed Yes and provided to Patient/Care Provider: Clinical Summary of Care: Electronic Signature(s) Signed: 06/24/2015 5:26:34 PM By: [name] Gurney, RN, [name], [name] Entered By: [name] Gurney, RN, [name], [name] on 06/24/2015 10:57:38 [name] ([id number]) -------------------------------------------------------------------------------- Lower Extremity Assessment Details Patient Name: [name] Date of Service: 06/24/2015 10:15 AM Medical Record Number: [id number] Patient Account Number: [id number] Date of Birth/Sex: Jan 24, 1921 (81 y.o. Female) Treating RN: [name] Primary Care Physician: [name] Other Clinician: Referring Physician: Dorothey [name] Treating Physician/Extender: [name] in Treatment: 19 Vascular Assessment Pulses: Posterior Tibial Dorsalis Pedis Palpable: [Right:No] Doppler: [Right:Monophasic] Extremity colors, hair growth, and conditions: Extremity Color: [Right:Pale] Hair Growth on Extremity: [Right:No] Temperature of Extremity: [Right:Warm] Capillary Refill: [Right:< 3 seconds] Toe Nail Assessment Left: Right: Thick: No Discolored: No Deformed: No Improper Length and Hygiene: No Electronic Signature(s) Signed: 06/24/2015 5:26:34 PM By: [name] Gurney, RN, [name], [name] Entered By: [name] Gurney, RN, [name], [name] on 06/24/2015 10:24:33 [name] ([id number]) -------------------------------------------------------------------------------- Multi Wound Chart Details Patient Name: [name] Date of Service: 06/24/2015 10:15 AM Medical Record Number: [id number] Patient Account Number: [id number] Date of Birth/Sex: [birth year] (81 y.o. 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] Comorbid History: [1:N/A] [3:N/A] [6:N/A] Date Acquired: [1:11/04/2014] [3:01/03/2015] [6:06/12/2015] Weeks of Treatment: [1:19] [3:18] [6:1] Wound  Status: [1:Open] [3:Open] [6:Open] Measurements L x W x D 1.4x1.5x0.1 [3:0x0x0] [6:0.7x1x0.1] (cm) Area (cm) : [1:1.649] [3:0] [6:0.55] Volume (cm) : [1:0.165] [3:0] [6:0.055] % Reduction in Area: [1:70.00%] [3:100.00%] [6:29.90%] % Reduction in Volume: 70.00% [3:100.00%] [6:30.40%] Classification: [1:Full Thickness Without Exposed Support Structures] [3:Full Thickness Without Exposed Support Structures] [6:Full Thickness Without Exposed Support Structures] Exudate Amount: [1:N/A] [3:N/A] [6:N/A] Wound Margin: [1:N/A] [3:N/A] [6:N/A] Granulation Amount: [1:N/A] [3:N/A] [6:N/A] Necrotic Amount: [1:N/A] [3:N/A] [6:N/A] Necrotic Tissue: [1:N/A] [3:N/A] [6:N/A] Epithelialization: [1:N/A] [3:N/A] [6:N/A] Periwound Skin Texture: No Abnormalities Noted No Abnormalities Noted [6:No Abnormalities Noted] Periwound Skin [1:No Abnormalities Noted No Abnormalities Noted] [6:No Abnormalities Noted] Moisture: Periwound Skin Color: No Abnormalities Noted No Abnormalities Noted [6:No Abnormalities Noted] Erythema Location: [1:N/A] [3:N/A] [6:N/A] Tenderness on No No No Palpation: Wound Preparation: N/A N/A N/A Wound Number: 7 8 N/A Photos: No  Photos No Photos N/A Wound Location: Right Lower Leg - Anterior Right Toe Fourth N/A Wounding Event: Gradually Appeared Gradually Appeared N/A 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 Lower Extremity Comorbid History: Arrhythmia, Deep Vein Arrhythmia, Deep Vein N/A Thrombosis, Peripheral Thrombosis, Peripheral Arterial Disease, Type II Arterial Disease, Type II Diabetes Diabetes Date Acquired: 06/18/2015 06/09/2015 N/A Weeks of Treatment: 0 0 N/A Wound Status: Open Open N/A Measurements L x W x D 0.5x0.6x0.2 0.3x0.3x0.1 N/A (cm) Area (cm) : 0.236 0.071 N/A Volume (cm) : 0.047 0.007 N/A % Reduction in Area: 0.00% 0.00% N/A % Reduction in Volume: 0.00% 0.00% N/A Classification: Grade 1 Unable to visualize wound N/A bed Exudate Amount: None Present None Present N/A Wound Margin: Flat and Intact Flat and Intact N/A Granulation Amount: None Present (0%) None Present (0%) N/A Necrotic Amount: Large (67-100%) Large (67-100%) N/A Necrotic Tissue: Adherent Slough Eschar N/A Exposed Structures: Fascia: No Fascia: No N/A Fat: No Fat: No Tendon: No Tendon: No Muscle: No Muscle: No Joint: No Joint: No Bone: No Bone: No Limited to Skin Limited to Skin Breakdown Breakdown Epithelialization: None None N/A Periwound Skin Texture: Edema: No Edema: No N/A Excoriation: No Excoriation: No Induration: No Induration: No Callus: No Callus: No Crepitus: No Crepitus: No Fluctuance: No Fluctuance: No Friable: No Friable: No Rash: No Rash: No Scarring: No Scarring: No Periwound Skin N/A Moisture: Divito, Chantavia ([id number]) Maceration: No Maceration: No Moist: No Moist: No Dry/Scaly: No Dry/Scaly: No Periwound Skin Color: Erythema: Yes Erythema: Yes N/A Atrophie Blanche: No Atrophie Blanche: No Cyanosis: No Cyanosis: No Ecchymosis: No Ecchymosis: No Hemosiderin Staining: No Hemosiderin Staining: No Mottled:  No Mottled: No Pallor: No Pallor: No Rubor: No Rubor: No Erythema Location: Circumferential Circumferential N/A Tenderness on No No N/A Palpation: Wound Preparation: Ulcer Cleansing: Ulcer Cleansing: N/A Rinsed/Irrigated with Rinsed/Irrigated with Saline Saline Topical Anesthetic Topical Anesthetic Applied: Other: liodocaine Applied: Other: lidocaine 4% 4% Treatment Notes Electronic Signature(s) Signed: 06/24/2015 4:21:27 PM By: [name] Entered By: [name] on 06/24/2015 10:39:11 [name] ([id number]) -------------------------------------------------------------------------------- Multi-Disciplinary Care Plan Details Patient Name: [name] Date of Service: 06/24/2015 10:15 AM Medical Record Number: [id number] Patient Account Number: [id number] Date of Birth/Sex: 1921/02/13 (81 y.o. Female) Treating RN: [name] Primary Care Physician: [name] Other Clinician: Referring Physician: Dorothey [name] Treating Physician/Extender: [name] in Treatment: 73 Active Inactive Abuse / Safety / Falls / Self Care Management Nursing Diagnoses: Impaired physical mobility Potential for falls Goals: Patient will remain injury free Date Initiated: 02/11/2015 Goal Status: Active Patient/caregiver will verbalize understanding of skin care regimen Date Initiated: 02/11/2015 Goal Status:  Active Patient/caregiver will verbalize/demonstrate measures taken to prevent injury and/or falls Date Initiated: 02/11/2015 Goal Status: Active Patient/caregiver will verbalize/demonstrate understanding of what to do in case of emergency Date Initiated: 02/11/2015 Goal Status: Active Interventions: Assess fall risk on admission and as needed Provide education on fall prevention Provide education on safe transfers Treatment Activities: Patient referred to home care : 06/24/2015 Notes: Nutrition Nursing Diagnoses: Imbalanced nutrition Potential for  alteratiion in Nutrition/Potential for imbalanced nutrition Gillihan, Sunnie ([id number]) Goals: Patient/caregiver verbalizes understanding of need to maintain therapeutic glucose control per primary care physician Date Initiated: 02/11/2015 Goal Status: Active Patient/caregiver will maintain therapeutic glucose control Date Initiated: 02/11/2015 Goal Status: Active Interventions: Assess HgA1c results as ordered upon admission and as needed Provide education on elevated blood sugars and impact on wound healing Provide education on nutrition Treatment Activities: Education provided on Nutrition : 04/22/2015 Obtain HgA1c : 06/24/2015 Notes: Wound/Skin Impairment Nursing Diagnoses: Impaired tissue integrity Knowledge deficit related to ulceration/compromised skin integrity Goals: Patient/caregiver will verbalize understanding of skin care regimen Date Initiated: 02/11/2015 Goal Status: Active Ulcer/skin breakdown will heal within 14 weeks Date Initiated: 02/11/2015 Goal Status: Active Interventions: Assess patient/caregiver ability to obtain necessary supplies Assess patient/caregiver ability to perform ulcer/skin care regimen upon admission and as needed Assess ulceration(s) every visit Provide education on ulcer and skin care Treatment Activities: Skin care regimen initiated : 06/24/2015 Topical wound management initiated : 06/24/2015 Notes: VIVAN, VANDERVEER ([id number]) Electronic Signature(s) Signed: 06/24/2015 4:21:27 PM By: [name] Entered By: [name] on 06/24/2015 10:38:47 [name] ([id number]) -------------------------------------------------------------------------------- Patient/Caregiver Education Details Patient Name: [name] Date of Service: 06/24/2015 10:15 AM Medical Record Number: [id number] Patient Account Number: [id number] Date of Birth/Gender: May 17, 1921 (81 y.o. Female) Treating RN: [name] Primary Care Physician: [name] Other Clinician: Referring Physician: Dorothey [name] Treating Physician/Extender: [name] in Treatment: 69 Education Assessment Education Provided To: Caregiver Education Topics Provided Wound/Skin Impairment: Handouts: Caring for Your Ulcer Methods: Demonstration Responses: State content correctly [employer]) Signed: 06/24/2015 5:26:34 PM By: [name] Gurney, RN, [name], [name] Entered By: [name] Gurney, RN, [name], [name] on 06/24/2015 10:57:51 [name] ([id number]) -------------------------------------------------------------------------------- Wound Assessment Details Patient Name: [name] Date of Service: 06/24/2015 10:15 AM Medical Record Number: [id number] Patient Account Number: [id number] Date of Birth/Sex: 1920-12-10 (81 y.o. 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 Wound Status: Open Wounding Event: Gradually Appeared Date Acquired: 11/04/2014 Weeks Of Treatment: 19 Clustered Wound: No Photos Photo Uploaded By: [name] Gurney, RN, [name], [name] on 06/24/2015 11:44:22 Wound Measurements Length: (cm) 1.4 Width: (cm) 1.5 Depth: (cm) 0.1 Area: (cm) 1.649 Volume: (cm) 0.165 % Reduction in Area: 70% % Reduction in Volume: 70% Wound Description Full Thickness Without Exposed Classification: Support Structures Periwound Skin Texture Texture Color No Abnormalities Noted: No No Abnormalities Noted: No Moisture No Abnormalities Noted: No Treatment Notes Wound #1 (Right Calcaneous) Whittlesey, Jermisha ([id number]) 1. Cleansed with: Clean wound with Normal Saline 2. Anesthetic Topical Lidocaine 4% cream to wound bed prior to debridement 4. Dressing Applied: Aquacel Ag 5. Secondary Dressing  Applied Gauze and Kerlix/Conform Electronic Signature(s) Signed: 06/24/2015 5:26:34 PM By: [name] Gurney, RN, [name], [name] Entered By: [name] Gurney, RN, [name], [name] on 06/24/2015 10:28:18 [name] ([id number]) -------------------------------------------------------------------------------- Wound Assessment Details Patient Name: [name] Date of Service: 06/24/2015 10:15 AM Medical Record Number: [id number] Patient Account Number:  [id number] Date of Birth/Sex: 01-26-1921 (81 y.o. Female) Treating RN: [name] Primary Care Physician: [name] Other Clinician: Referring Physician: Dorothey [name] Treating Physician/Extender: [name] in Treatment: 19 Wound Status Wound Number: 3 Primary Etiology: Dehisced Wound Wound Location: Left Amputation Site - Below Wound Status: Open Knee Wounding Event: Surgical Injury Date Acquired: 01/03/2015 Weeks Of Treatment: 18 Clustered Wound: No Photos Photo Uploaded By: [name] Gurney, RN, [name], [name] on 06/24/2015 11:44:47 Wound Measurements Length: (cm) Width: (cm) Depth: (cm) Area: (cm) Volume: (cm) 0 % Reduction in Area: 100% 0 % Reduction in Volume: 100% 0 0 0 Wound Description Full Thickness Without Exposed Classification: Support Structures Periwound Skin Texture Texture Color No Abnormalities Noted: No No Abnormalities Noted: No Moisture No Abnormalities Noted: No Electronic Signature(sROGINA, SCHIANO ([id number]) Signed: 06/24/2015 5:26:34 PM By: [name] Gurney, RN, [name], [name] Entered By: [name] Gurney, RN, [name], [name] on 06/24/2015 10:28:18 [name] ([id number]) -------------------------------------------------------------------------------- Wound Assessment Details Patient Name: [name] Date of Service: 06/24/2015 10:15 AM Medical Record Number: [id number] Patient Account Number: [id number] Date of Birth/Sex: [birth year] (81 y.o. Female) Treating RN: [name] Primary Care Physician: [name] Other  Clinician: Referring Physician: Dorothey [name] Treating Physician/Extender: [name] in Treatment: 19 Wound Status Wound Number: 6 Primary Etiology: Arterial Insufficiency Ulcer Wound Location: Right, Posterior Lower Leg Wound Status: Open Wounding Event: Blister Date Acquired: 06/12/2015 Weeks Of Treatment: 1 Clustered Wound: No Photos Photo Uploaded By: [name] Gurney, RN, [name], [name] on 06/24/2015 11:44:48 Wound Measurements Length: (cm) 0.7 Width: (cm) 1 Depth: (cm) 0.1 Area: (cm) 0.55 Volume: (cm) 0.055 % Reduction in Area: 29.9% % Reduction in Volume: 30.4% Wound Description Full Thickness Without Exposed Classification: Support Structures Periwound Skin Texture Texture Color No Abnormalities Noted: No No Abnormalities Noted: No Moisture No Abnormalities Noted: No Treatment Notes Wound #6 (Right, Posterior Lower Leg) Ilagan, Sherlyn ([id number]) 1. Cleansed with: Clean wound with Normal Saline 2. Anesthetic Topical Lidocaine 4% cream to wound bed prior to debridement 4. Dressing Applied: Aquacel Ag 5. Secondary Dressing Applied Gauze and Kerlix/Conform Electronic Signature(s) Signed: 06/24/2015 5:26:34 PM By: [name] Gurney, RN, [name], [name] Entered By: [name] Gurney, RN, [name], [name] on 06/24/2015 10:28:18 [name] ([id number]) -------------------------------------------------------------------------------- Wound Assessment Details Patient Name: [name] Date of Service: 06/24/2015 10:15 AM Medical Record Number: [id number] Patient Account Number: [id number] Date of Birth/Sex: 06-05-1921 (81 y.o. Female) Treating RN: [name] Primary Care Physician: [name] Other Clinician: Referring Physician: Terance Hart, [name] Treating Physician/Extender: [name] in Treatment: 19 Wound Status Wound Number: 7 Primary Diabetic Wound/Ulcer of the Lower Etiology: Extremity Wound Location: Right Lower Leg - Anterior Wound Open Wounding Event:  Gradually Appeared Status: Date Acquired: 06/18/2015 Comorbid Arrhythmia, Deep Vein Thrombosis, Weeks Of Treatment: 0 History: Peripheral Arterial Disease, Type II Clustered Wound: No Diabetes Photos Photo Uploaded By: [name] Gurney, RN, [name], [name] on 06/24/2015 11:45:07 Wound Measurements Length: (cm) 0.5 Width: (cm) 0.6 Depth: (cm) 0.2 Area: (cm) 0.236 Volume: (cm) 0.047 % Reduction in Area: 0% % Reduction in Volume: 0% Epithelialization: None Wound Description Classification: Grade 1 Wound Margin: Flat and Intact Exudate Amount: None Present Wound Bed Granulation Amount: None Present (0%) Exposed Structure Necrotic Amount: Large (67-100%) Fascia Exposed: No Necrotic Quality: Adherent Slough Fat Layer Exposed: No Tendon Exposed: No Muscle Exposed: No Joint Exposed: No Kunka, Kadijah ([id number]) Bone Exposed: No Limited to Skin Breakdown Periwound Skin Texture Texture Color No Abnormalities Noted: No No Abnormalities Noted: No Callus: No Atrophie Blanche: No Crepitus: No Cyanosis: No Excoriation: No Ecchymosis:  No Fluctuance: No Erythema: Yes Friable: No Erythema Location: Circumferential Induration: No Hemosiderin Staining: No Localized Edema: No Mottled: No Rash: No Pallor: No Scarring: No Rubor: No Moisture No Abnormalities Noted: No Dry / Scaly: No Maceration: No Moist: No Wound Preparation Ulcer Cleansing: Rinsed/Irrigated with Saline Topical Anesthetic Applied: Other: liodocaine 4%, Treatment Notes Wound #7 (Right, Anterior Lower Leg) 1. Cleansed with: Clean wound with Normal Saline 2. Anesthetic Topical Lidocaine 4% cream to wound bed prior to debridement 4. Dressing Applied: Santyl Ointment 5. Secondary Dressing Applied Gauze and Kerlix/Conform Electronic Signature(s) Signed: 06/24/2015 5:26:34 PM By: [name] Gurney, RN, [name], [name] Entered By: [name] Gurney, RN, [name], [name] on 06/24/2015 10:29:54 [name]  ([id number]) -------------------------------------------------------------------------------- Wound Assessment Details Patient Name: [name] Date of Service: 06/24/2015 10:15 AM Medical Record Number: [id number] Patient Account Number: [id number] Date of Birth/Sex: 03-02-1921 (81 y.o. Female) Treating RN: [name] Primary Care Physician: [name] Other Clinician: Referring Physician: Terance Hart, [name] Treating Physician/Extender: [name] in Treatment: 19 Wound Status Wound Number: 8 Primary Diabetic Wound/Ulcer of the Lower Etiology: Extremity Wound Location: Right Toe Fourth Wound Open Wounding Event: Gradually Appeared Status: Date Acquired: 06/09/2015 Comorbid Arrhythmia, Deep Vein Thrombosis, Weeks Of Treatment: 0 History: Peripheral Arterial Disease, Type II Clustered Wound: No Diabetes Photos Photo Uploaded By: [name] Gurney, RN, [name], [name] on 06/24/2015 11:45:08 Wound Measurements Length: (cm) 0.3 Width: (cm) 0.3 Depth: (cm) 0.1 Area: (cm) 0.071 Volume: (cm) 0.007 % Reduction in Area: 0% % Reduction in Volume: 0% Epithelialization: None Wound Description Classification: Unable to visualize wound bed Wound Margin: Flat and Intact Exudate Amount: None Present Wound Bed Granulation Amount: None Present (0%) Exposed Structure Necrotic Amount: Large (67-100%) Fascia Exposed: No Necrotic Quality: Eschar Fat Layer Exposed: No Tendon Exposed: No Muscle Exposed: No Joint Exposed: No Righter, Tatisha ([id number]) Bone Exposed: No Limited to Skin Breakdown Periwound Skin Texture Texture Color No Abnormalities Noted: No No Abnormalities Noted: No Callus: No Atrophie Blanche: No Crepitus: No Cyanosis: No Excoriation: No Ecchymosis: No Fluctuance: No Erythema: Yes Friable: No Erythema Location: Circumferential Induration: No Hemosiderin Staining: No Localized Edema: No Mottled: No Rash: No Pallor: No Scarring: No Rubor:  No Moisture No Abnormalities Noted: No Dry / Scaly: No Maceration: No Moist: No Wound Preparation Ulcer Cleansing: Rinsed/Irrigated with Saline Topical Anesthetic Applied: Other: lidocaine 4%, Treatment Notes Wound #8 (Right Toe Fourth) 1. Cleansed with: Clean wound with Normal Saline 2. Anesthetic Topical Lidocaine 4% cream to wound bed prior to debridement 4. Dressing Applied: Aquacel Ag 5. Secondary Dressing Applied Gauze and Kerlix/Conform Electronic Signature(s) Signed: 06/24/2015 5:26:34 PM By: [name] Gurney, RN, [name], [name] Entered By: [name] Gurney, RN, [name], [name] on 06/24/2015 10:31:25 [name] ([id number]) -------------------------------------------------------------------------------- Vitals Details Patient Name: [name] Date of Service: 06/24/2015 10:15 AM Medical Record Number: [id number] Patient Account Number: [id number] Date of Birth/Sex: [birth year] (81 y.o. Female) Treating RN: [name] Primary Care Physician: [name] Other Clinician: Referring Physician: Dorothey [name] Treating Physician/Extender: [name] in Treatment: 19 Vital Signs Time Taken: 10:22 Temperature (F): 97.7 Height (in): 62 Pulse (bpm): 55 Weight (lbs): 155 Respiratory Rate (breaths/min): 18 Body Mass Index (BMI): 28.3 Blood Pressure (mmHg): 135/40 Reference Range: 80 - 120 mg / dl Electronic Signature(s) Signed: 06/24/2015 5:26:34 PM By: [name] Gurney, RN, [name], [name] Entered By: [name] Gurney, RN, [name], [name] on 06/24/2015 10:22:47

## 2015-07-01 ENCOUNTER — Encounter: Payer: Medicare Other | Attending: Surgery | Admitting: Surgery

## 2015-07-01 DIAGNOSIS — T8131XD Disruption of external operation (surgical) wound, not elsewhere classified, subsequent encounter: Secondary | ICD-10-CM | POA: Diagnosis not present

## 2015-07-01 DIAGNOSIS — L97812 Non-pressure chronic ulcer of other part of right lower leg with fat layer exposed: Secondary | ICD-10-CM | POA: Insufficient documentation

## 2015-07-01 DIAGNOSIS — E11621 Type 2 diabetes mellitus with foot ulcer: Secondary | ICD-10-CM | POA: Diagnosis not present

## 2015-07-01 DIAGNOSIS — Z89512 Acquired absence of left leg below knee: Secondary | ICD-10-CM | POA: Insufficient documentation

## 2015-07-01 DIAGNOSIS — I70235 Atherosclerosis of native arteries of right leg with ulceration of other part of foot: Secondary | ICD-10-CM | POA: Insufficient documentation

## 2015-07-01 DIAGNOSIS — L97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: Secondary | ICD-10-CM | POA: Insufficient documentation

## 2015-07-01 DIAGNOSIS — X58XXXD Exposure to other specified factors, subsequent encounter: Secondary | ICD-10-CM | POA: Insufficient documentation

## 2015-07-01 NOTE — Progress Notes (Signed)
[name], [name] ([id number]) Visit Report for 07/01/2015 Arrival Information Details Patient Name: [name], [name] Date of Service: 07/01/2015 10:00 AM Medical Record Number: [id number] Patient Account Number: [id number] Date of Birth/Sex: 27-Jun-1921 (81 y.o. Female) Treating RN: Afful, RN, BSN, [place] Sink Primary Care Physician: Dorothey Baseman Other Clinician: Referring Physician: Terance Hart, DAVID Treating Physician/Extender: Rudene Re in Treatment: 20 Visit Information History Since Last Visit Added or deleted any medications: No Patient Arrived: Wheel Chair Any new allergies or adverse reactions: No Arrival Time: 09:59 Had a fall or experienced change in No activities of daily living that may affect Accompanied By: dtr in law risk of falls: Transfer Assistance: Manual Signs or symptoms of abuse/neglect since last No Patient Identification Verified: Yes visito Secondary Verification Process Yes Hospitalized since last visit: No Completed: Has Dressing in Place as Prescribed: Yes Patient Has Alerts: Yes Pain Present Now: No Patient Alerts: DMII Electronic Signature(s) Signed: 07/01/2015 10:00:12 AM By: Elpidio Eric BSN, RN Entered By: Elpidio Eric on 07/01/2015 10:00:12 [name] ([id number]) -------------------------------------------------------------------------------- Clinic Level of Care Assessment Details Patient Name: [name] Date of Service: 07/01/2015 10:00 AM Medical Record Number: [id number] Patient Account Number: [id number] Date of Birth/Sex: 07/13/1921 (81 y.o. Female) Treating RN: Afful, RN, BSN, [place] Sink Primary Care Physician: Terance Hart, DAVID Other Clinician: Referring Physician: Terance Hart, DAVID Treating Physician/Extender: Rudene Re in Treatment: 20 Clinic Level of Care Assessment Items TOOL 4 Quantity Score []  - Use when only an EandM is performed on FOLLOW-UP visit 0 ASSESSMENTS - Nursing Assessment / Reassessment []  -  Reassessment of Co-morbidities (includes updates in patient status) 0 []  - Reassessment of Adherence to Treatment Plan 0 ASSESSMENTS - Wound and Skin Assessment / Reassessment []  - Simple Wound Assessment / Reassessment - one wound 0 X - Complex Wound Assessment / Reassessment - multiple wounds 4 5 []  - Dermatologic / Skin Assessment (not related to wound area) 0 ASSESSMENTS - Focused Assessment []  - Circumferential Edema Measurements - multi extremities 0 []  - Nutritional Assessment / Counseling / Intervention 0 X - Lower Extremity Assessment (monofilament, tuning fork, pulses) 1 5 []  - Peripheral Arterial Disease Assessment (using hand held doppler) 0 ASSESSMENTS - Ostomy and/or Continence Assessment and Care []  - Incontinence Assessment and Management 0 []  - Ostomy Care Assessment and Management (repouching, etc.) 0 PROCESS - Coordination of Care X - Simple Patient / Family Education for ongoing care 1 15 []  - Complex (extensive) Patient / Family Education for ongoing care 0 []  - Staff obtains Chiropractor, Records, Test Results / Process Orders 0 []  - Staff telephones HHA, Nursing Homes / Clarify orders / etc 0 []  - Routine Transfer to another Facility (non-emergent condition) 0 [name], [name] ([id number]) []  - Routine Hospital Admission (non-emergent condition) 0 []  - New Admissions / Manufacturing engineer / Ordering NPWT, Apligraf, etc. 0 []  - Emergency Hospital Admission (emergent condition) 0 []  - Simple Discharge Coordination 0 []  - Complex (extensive) Discharge Coordination 0 PROCESS - Special Needs []  - Pediatric / Minor Patient Management 0 []  - Isolation Patient Management 0 []  - Hearing / Language / Visual special needs 0 []  - Assessment of Community assistance (transportation, D/C planning, etc.) 0 []  - Additional assistance / Altered mentation 0 []  - Support Surface(s) Assessment (bed, cushion, seat, etc.) 0 INTERVENTIONS - Wound Cleansing / Measurement []  - Simple  Wound Cleansing - one wound 0 X - Complex Wound Cleansing - multiple wounds 4 5 []  - Wound Imaging (photographs - any number of wounds) 0 []  - Wound Tracing (  instead of photographs) 0 []  - Simple Wound Measurement - one wound 0 []  - Complex Wound Measurement - multiple wounds 0 INTERVENTIONS - Wound Dressings X - Small Wound Dressing one or multiple wounds 4 10 []  - Medium Wound Dressing one or multiple wounds 0 []  - Large Wound Dressing one or multiple wounds 0 []  - Application of Medications - topical 0 []  - Application of Medications - injection 0 INTERVENTIONS - Miscellaneous []  - External ear exam 0 [name], [name] ([id number]) []  - Specimen Collection (cultures, biopsies, blood, body fluids, etc.) 0 []  - Specimen(s) / Culture(s) sent or taken to Lab for analysis 0 []  - Patient Transfer (multiple staff / Michiel Sites Lift / Similar devices) 0 []  - Simple Staple / Suture removal (25 or less) 0 []  - Complex Staple / Suture removal (26 or more) 0 []  - Hypo / Hyperglycemic Management (close monitor of Blood Glucose) 0 []  - Ankle / Brachial Index (ABI) - do not check if billed separately 0 X - Vital Signs 1 5 Has the patient been seen at the hospital within the last three years: Yes Total Score: 105 Level Of Care: New/Established - Level 3 Electronic Signature(s) Signed: 07/01/2015 10:36:36 AM By: Elpidio Eric BSN, RN Entered By: Elpidio Eric on 07/01/2015 10:36:35 [name] ([id number]) -------------------------------------------------------------------------------- Encounter Discharge Information Details Patient Name: [name] Date of Service: 07/01/2015 10:00 AM Medical Record Number: [id number] Patient Account Number: [id number] Date of Birth/Sex: 20-May-1921 (81 y.o. Female) Treating RN: Clover Mealy, RN, BSN, [place] Sink Primary Care Physician: Dorothey Baseman Other Clinician: Referring Physician: Dorothey Baseman Treating Physician/Extender: Rudene Re in Treatment:  20 Encounter Discharge Information Items Schedule Follow-up Appointment: No Medication Reconciliation completed No and provided to Patient/Care [place] Maiolo: Provided on Clinical Summary of Care: 07/01/2015 Form Type Recipient Paper Patient HS Electronic Signature(s) Signed: 07/01/2015 10:40:30 AM By: Gwenlyn Perking Entered By: Gwenlyn Perking on 07/01/2015 10:40:30 [name] ([id number]) -------------------------------------------------------------------------------- Lower Extremity Assessment Details Patient Name: [name] Date of Service: 07/01/2015 10:00 AM Medical Record Number: [id number] Patient Account Number: [id number] Date of Birth/Sex: 02/01/1921 (81 y.o. Female) Treating RN: Afful, RN, BSN, [place] Sink Primary Care Physician: Dorothey Baseman Other Clinician: Referring Physician: Terance Hart, DAVID Treating Physician/Extender: Rudene Re in Treatment: 20 Vascular Assessment Pulses: Posterior Tibial Dorsalis Pedis Palpable: [Right:No] Doppler: [Right:Monophasic] Extremity colors, hair growth, and conditions: Extremity Color: [Right:Mottled] Hair Growth on Extremity: [Right:No] Temperature of Extremity: [Right:Warm] Toe Nail Assessment Left: Right: Thick: No Discolored: No Deformed: No Improper Length and Hygiene: No Electronic Signature(s) Signed: 07/01/2015 10:01:22 AM By: Elpidio Eric BSN, RN Entered By: Elpidio Eric on 07/01/2015 10:01:21 [name] ([id number]) -------------------------------------------------------------------------------- Multi Wound Chart Details Patient Name: [name] Date of Service: 07/01/2015 10:00 AM Medical Record Number: [id number] Patient Account Number: [id number] Date of Birth/Sex: [birth year] (81 y.o. Female) Treating RN: Clover Mealy, RN, BSN, [place] Sink Primary Care Physician: Dorothey Baseman Other Clinician: Referring Physician: Terance Hart, DAVID Treating Physician/Extender: Rudene Re in Treatment: 20 Vital  Signs Height(in): 62 Pulse(bpm): 48 Weight(lbs): 155 Blood Pressure 123/53 (mmHg): Body Mass Index(BMI): 28 Temperature(F): 97.8 Respiratory Rate 17 (breaths/min): Photos: [1:No Photos] [6:No Photos] [7:No Photos] Wound Location: [1:Right Calcaneous] [6:Right Lower Leg - Posterior] [7:Right Lower Leg - Anterior] Wounding Event: [1:Gradually Appeared] [6:Blister] [7:Gradually Appeared] 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] Comorbid History: [1:Arrhythmia, Deep Vein Thrombosis, Peripheral Arterial Disease, Type II Arterial Disease, Type II Arterial Disease, Type II Diabetes] [6:Arrhythmia, Deep Vein Thrombosis, Peripheral Diabetes] [7:Arrhythmia, Deep Vein Thrombosis,  Peripheral  Diabetes] Date Acquired: [1:11/04/2014] [6:06/12/2015] [7:06/18/2015] Weeks of Treatment: [1:20] [6:2] [7:1] Wound Status: [1:Open] [6:Open] [7:Open] Measurements L x W x D 0.8x1x0.1 [6:1x3x0.1] [7:0.4x0.5x0.1] (cm) Area (cm) : [1:0.628] [6:2.356] [7:0.157] Volume (cm) : [1:0.063] [6:0.236] [7:0.016] % Reduction in Area: [1:88.60%] [6:-200.10%] [7:33.50%] % Reduction in Volume: 88.50% [6:-198.70%] [7:66.00%] Classification: [1:Full Thickness Without Exposed Support Structures] [6:Full Thickness Without Exposed Support Structures] [7:Grade 1] HBO Classification: [1:Grade 1] [6:Grade 1] [7:N/A] Exudate Amount: [1:Medium] [6:Small] [7:Small] Exudate Type: [1:Serosanguineous] [6:Serosanguineous] [7:Serous] Exudate Color: [1:red, brown] [6:red, brown] [7:amber] Wound Margin: [1:Distinct, outline attached Distinct, outline attached Flat and Intact] Granulation Amount: [1:Medium (34-66%)] [6:Medium (34-66%)] [7:None Present (0%)] Granulation Quality: [1:Pink, Pale] [6:Pink, Pale] [7:N/A] Necrotic Amount: [1:Medium (34-66%)] [6:Small (1-33%)] [7:Large (67-100%)] Exposed Structures: Fascia: No Fascia: No Fascia: No Fat:  No Fat: No Fat: No Tendon: No Tendon: No Tendon: No Muscle: No Muscle: No Muscle: No Joint: No Joint: No Joint: No Bone: No Bone: No Bone: No Limited to Skin Limited to Skin Limited to Skin Breakdown Breakdown Breakdown Epithelialization: Small (1-33%) Small (1-33%) None Periwound Skin Texture: Edema: No Edema: Yes Edema: Yes Excoriation: No Excoriation: No Excoriation: No Induration: No Induration: No Induration: No Callus: No Callus: No Callus: No Crepitus: No Crepitus: No Crepitus: No Fluctuance: No Fluctuance: No Fluctuance: No Friable: No Friable: No Friable: No Rash: No Rash: No Rash: No Scarring: No Scarring: No Scarring: No Periwound Skin Moist: Yes Moist: Yes Moist: Yes Moisture: Dry/Scaly: Yes Maceration: No Maceration: No Maceration: No Dry/Scaly: No Dry/Scaly: No Periwound Skin Color: Atrophie Blanche: No Atrophie Blanche: No Erythema: Yes Cyanosis: No Cyanosis: No Atrophie Blanche: No Ecchymosis: No Ecchymosis: No Cyanosis: No Erythema: No Erythema: No Ecchymosis: No Hemosiderin Staining: No Hemosiderin Staining: No Hemosiderin Staining: No Mottled: No Mottled: No Mottled: No Pallor: No Pallor: No Pallor: No Rubor: No Rubor: No Rubor: No Erythema Location: N/A N/A Circumferential Temperature: N/A No Abnormality N/A Tenderness on No Yes No Palpation: Wound Preparation: Ulcer Cleansing: Ulcer Cleansing: Ulcer Cleansing: Rinsed/Irrigated with Rinsed/Irrigated with Rinsed/Irrigated with Saline Saline Saline Topical Anesthetic Topical Anesthetic Topical Anesthetic Applied: Other: lidocaine Applied: Other: lidocaine Applied: Other: liodocaine 4% 4% 4% Wound Number: 8 N/A N/A Photos: No Photos N/A N/A Wound Location: Right Toe Fourth N/A N/A Wounding Event: Gradually Appeared N/A N/A Primary Etiology: Diabetic Wound/Ulcer of N/A N/A the Lower Extremity Comorbid History: Arrhythmia, Deep Vein N/A N/A Thrombosis,  Peripheral [name], [name] ([id number]) Arterial Disease, Type II Diabetes Date Acquired: 06/09/2015 N/A N/A Weeks of Treatment: 1 N/A N/A Wound Status: Open N/A N/A Measurements L x W x D 0.3x0.3x0.1 N/A N/A (cm) Area (cm) : 0.071 N/A N/A Volume (cm) : 0.007 N/A N/A % Reduction in Area: 0.00% N/A N/A % Reduction in Volume: 0.00% N/A N/A Classification: Unable to visualize wound N/A N/A bed HBO Classification: N/A N/A N/A Exudate Amount: Small N/A N/A Exudate Type: Serous N/A N/A Exudate Color: amber N/A N/A Wound Margin: Flat and Intact N/A N/A Granulation Amount: None Present (0%) N/A N/A Granulation Quality: N/A N/A N/A Necrotic Amount: Large (67-100%) N/A N/A Exposed Structures: Fascia: No N/A N/A Fat: No Tendon: No Muscle: No Joint: No Bone: No Limited to Skin Breakdown Epithelialization: None N/A N/A Periwound Skin Texture: Edema: No N/A N/A Excoriation: No Induration: No Callus: No Crepitus: No Fluctuance: No Friable: No Rash: No Scarring: No Periwound Skin Moist: Yes N/A N/A Moisture: Maceration: No Dry/Scaly: No Periwound Skin Color: Erythema: Yes N/A N/A Atrophie Blanche: No Cyanosis: No Ecchymosis: No Hemosiderin Staining:  No Mottled: No Pallor: No Rubor: No [name], [name] ([id number]) Erythema Location: Circumferential N/A N/A Temperature: N/A N/A N/A Tenderness on Yes N/A N/A Palpation: Wound Preparation: Ulcer Cleansing: N/A N/A Rinsed/Irrigated with Saline Topical Anesthetic Applied: Other: lidocaine 4% Treatment Notes Electronic Signature(s) Signed: 07/01/2015 10:25:10 AM By: Elpidio Eric BSN, RN Entered By: Elpidio Eric on 07/01/2015 10:25:10 [name] ([id number]) -------------------------------------------------------------------------------- Multi-Disciplinary Care Plan Details Patient Name: [name] Date of Service: 07/01/2015 10:00 AM Medical Record Number: [id number] Patient Account Number: [id number] Date of  Birth/Sex: December 13, 1920 (81 y.o. Female) Treating RN: Afful, RN, BSN, [place] Sink Primary Care Physician: Terance Hart, DAVID Other Clinician: Referring Physician: Terance Hart, DAVID Treating Physician/Extender: Rudene Re in Treatment: 20 Active Inactive Abuse / Safety / Falls / Self Care Management Nursing Diagnoses: Impaired physical mobility Potential for falls Goals: Patient will remain injury free Date Initiated: 02/11/2015 Goal Status: Active Patient/caregiver will verbalize understanding of skin care regimen Date Initiated: 02/11/2015 Goal Status: Active Patient/caregiver will verbalize/demonstrate measures taken to prevent injury and/or falls Date Initiated: 02/11/2015 Goal Status: Active Patient/caregiver will verbalize/demonstrate understanding of what to do in case of emergency Date Initiated: 02/11/2015 Goal Status: Active Interventions: Assess fall risk on admission and as needed Provide education on fall prevention Provide education on safe transfers Treatment Activities: Patient referred to home care : 07/01/2015 Notes: Nutrition Nursing Diagnoses: Imbalanced nutrition Potential for alteratiion in Nutrition/Potential for imbalanced nutrition Bogosian, Phoebe ([id number]) Goals: Patient/caregiver verbalizes understanding of need to maintain therapeutic glucose control per primary care physician Date Initiated: 02/11/2015 Goal Status: Active Patient/caregiver will maintain therapeutic glucose control Date Initiated: 02/11/2015 Goal Status: Active Interventions: Assess HgA1c results as ordered upon admission and as needed Provide education on elevated blood sugars and impact on wound healing Provide education on nutrition Treatment Activities: Education provided on Nutrition : 04/22/2015 Obtain HgA1c : 07/01/2015 Notes: Wound/Skin Impairment Nursing Diagnoses: Impaired tissue integrity Knowledge deficit related to ulceration/compromised skin  integrity Goals: Patient/caregiver will verbalize understanding of skin care regimen Date Initiated: 02/11/2015 Goal Status: Active Ulcer/skin breakdown will heal within 14 weeks Date Initiated: 02/11/2015 Goal Status: Active Interventions: Assess patient/caregiver ability to obtain necessary supplies Assess patient/caregiver ability to perform ulcer/skin care regimen upon admission and as needed Assess ulceration(s) every visit Provide education on ulcer and skin care Treatment Activities: Skin care regimen initiated : 07/01/2015 Topical wound management initiated : 07/01/2015 Notes: [name], [name] ([id number]) Electronic Signature(s) Signed: 07/01/2015 10:23:01 AM By: Elpidio Eric BSN, RN Previous Signature: 07/01/2015 10:15:29 AM Version By: Elpidio Eric BSN, RN Entered By: Elpidio Eric on 07/01/2015 10:23:00 [name] ([id number]) -------------------------------------------------------------------------------- Pain Assessment Details Patient Name: [name] Date of Service: 07/01/2015 10:00 AM Medical Record Number: [id number] Patient Account Number: [id number] Date of Birth/Sex: 1921/07/29 (81 y.o. Female) Treating RN: Clover Mealy, RN, BSN, [place] Sink Primary Care Physician: Dorothey Baseman Other Clinician: Referring Physician: Dorothey Baseman Treating Physician/Extender: Rudene Re in Treatment: 20 Active Problems Location of Pain Severity and Description of Pain Patient Has Paino No Site Locations Pain Management and Medication Current Pain Management: Electronic Signature(s) Signed: 07/01/2015 10:00:28 AM By: Elpidio Eric BSN, RN Entered By: Elpidio Eric on 07/01/2015 10:00:28 [name] ([id number]) -------------------------------------------------------------------------------- Wound Assessment Details Patient Name: [name] Date of Service: 07/01/2015 10:00 AM Medical Record Number: [id number] Patient Account Number: [id number] Date of Birth/Sex:  [birth year] (81 y.o. Female) Treating RN: Clover Mealy, RN, BSN, [place] Sink Primary Care Physician: Dorothey Baseman Other Clinician: Referring Physician: Terance Hart DAVID Treating Physician/Extender: Rudene Re in Treatment: 20 Wound Status Wound Number: 1 Primary Arterial  Insufficiency Ulcer Etiology: Wound Location: Right Calcaneous Wound Open Wounding Event: Gradually Appeared Status: Date Acquired: 11/04/2014 Comorbid Arrhythmia, Deep Vein Thrombosis, Weeks Of Treatment: 20 History: Peripheral Arterial Disease, Type II Clustered Wound: No Diabetes Photos Photo Uploaded By: Elpidio Eric on 07/01/2015 14:30:28 Wound Measurements Length: (cm) 0.8 Width: (cm) 1 Depth: (cm) 0.1 Area: (cm) 0.628 Volume: (cm) 0.063 % Reduction in Area: 88.6% % Reduction in Volume: 88.5% Epithelialization: Small (1-33%) Tunneling: No Undermining: No Wound Description Full Thickness Without Exposed Classification: Support Structures Diabetic Severity Grade 1 (Wagner): Wound Margin: Distinct, outline attached Exudate Amount: Medium Exudate Type: Serosanguineous Exudate Color: red, brown Foul Odor After Cleansing: No Wound Bed Granulation Amount: Medium (34-66%) Exposed Structure [name], [name] ([id number]) Granulation Quality: Pink, Pale Fascia Exposed: No Necrotic Amount: Medium (34-66%) Fat Layer Exposed: No Tendon Exposed: No Muscle Exposed: No Joint Exposed: No Bone Exposed: No Limited to Skin Breakdown Periwound Skin Texture Texture Color No Abnormalities Noted: No No Abnormalities Noted: No Callus: No Atrophie Blanche: No Crepitus: No Cyanosis: No Excoriation: No Ecchymosis: No Fluctuance: No Erythema: No Friable: No Hemosiderin Staining: No Induration: No Mottled: No Localized Edema: No Pallor: No Rash: No Rubor: No Scarring: No Moisture No Abnormalities Noted: No Dry / Scaly: Yes Maceration: No Moist: Yes Wound Preparation Ulcer Cleansing:  Rinsed/Irrigated with Saline Topical Anesthetic Applied: Other: lidocaine 4%, Electronic Signature(s) Signed: 07/01/2015 10:16:39 AM By: Elpidio Eric BSN, RN Entered By: Elpidio Eric on 07/01/2015 10:16:39 [name] ([id number]) -------------------------------------------------------------------------------- Wound Assessment Details Patient Name: [name] Date of Service: 07/01/2015 10:00 AM Medical Record Number: [id number] Patient Account Number: [id number] Date of Birth/Sex: April 24, 1921 (81 y.o. Female) Treating RN: Afful, RN, BSN, [place] Sink Primary Care Physician: Dorothey Baseman Other Clinician: Referring Physician: Terance Hart, DAVID Treating Physician/Extender: Rudene Re in Treatment: 20 Wound Status Wound Number: 6 Primary Arterial Insufficiency Ulcer Etiology: Wound Location: Right Lower Leg - Posterior Wound Open Wounding Event: Blister Status: Date Acquired: 06/12/2015 Comorbid Arrhythmia, Deep Vein Thrombosis, Weeks Of Treatment: 2 History: Peripheral Arterial Disease, Type II Clustered Wound: No Diabetes Photos Photo Uploaded By: Elpidio Eric on 07/01/2015 14:31:23 Wound Measurements Length: (cm) 1 Width: (cm) 3 Depth: (cm) 0.1 Area: (cm) 2.356 Volume: (cm) 0.236 % Reduction in Area: -200.1% % Reduction in Volume: -198.7% Epithelialization: Small (1-33%) Tunneling: No Undermining: No Wound Description Full Thickness Without Exposed Classification: Support Structures Diabetic Severity Grade 1 (Wagner): Wound Margin: Distinct, outline attached Exudate Amount: Small Exudate Type: Serosanguineous Exudate Color: red, brown Foul Odor After Cleansing: No Wound Bed Granulation Amount: Medium (34-66%) Exposed Structure [name], [name] ([id number]) Granulation Quality: Pink, Pale Fascia Exposed: No Necrotic Amount: Small (1-33%) Fat Layer Exposed: No Necrotic Quality: Adherent Slough Tendon Exposed: No Muscle Exposed: No Joint Exposed:  No Bone Exposed: No Limited to Skin Breakdown Periwound Skin Texture Texture Color No Abnormalities Noted: No No Abnormalities Noted: No Callus: No Atrophie Blanche: No Crepitus: No Cyanosis: No Excoriation: No Ecchymosis: No Fluctuance: No Erythema: No Friable: No Hemosiderin Staining: No Induration: No Mottled: No Localized Edema: Yes Pallor: No Rash: No Rubor: No Scarring: No Temperature / Pain Moisture Temperature: No Abnormality No Abnormalities Noted: No Tenderness on Palpation: Yes Dry / Scaly: No Maceration: No Moist: Yes Wound Preparation Ulcer Cleansing: Rinsed/Irrigated with Saline Topical Anesthetic Applied: Other: lidocaine 4%, Electronic Signature(s) Signed: 07/01/2015 10:19:27 AM By: Elpidio Eric BSN, RN Entered By: Elpidio Eric on 07/01/2015 10:19:27 [name] ([id number]) -------------------------------------------------------------------------------- Wound Assessment Details Patient Name: [name] Date of Service: 07/01/2015 10:00 AM Medical Record Number:  [id number] Patient Account Number: [id number] Date of Birth/Sex: Sep 02, 1921 (81 y.o. Female) Treating RN: Afful, RN, BSN, [place] Primary Care Physician: Terance Hart, DAVID Other Clinician: Referring Physician: Terance Hart, DAVID Treating Physician/Extender: Rudene Re in Treatment: 20 Wound Status Wound Number: 7 Primary Diabetic Wound/Ulcer of the Lower Etiology: Extremity Wound Location: Right Lower Leg - Anterior Wound Open Wounding Event: Gradually Appeared Status: Date Acquired: 06/18/2015 Comorbid Arrhythmia, Deep Vein Thrombosis, Weeks Of Treatment: 1 History: Peripheral Arterial Disease, Type II Clustered Wound: No Diabetes Photos Photo Uploaded By: Elpidio Eric on 07/01/2015 14:31:25 Wound Measurements Length: (cm) 0.4 Width: (cm) 0.5 Depth: (cm) 0.1 Area: (cm) 0.157 Volume: (cm) 0.016 % Reduction in Area: 33.5% % Reduction in Volume:  66% Epithelialization: None Tunneling: No Undermining: No Wound Description Classification: Grade 1 Wound Margin: Flat and Intact Exudate Amount: Small Exudate Type: Serous Exudate Color: amber Wound Bed Granulation Amount: None Present (0%) Exposed Structure Necrotic Amount: Large (67-100%) Fascia Exposed: No Necrotic Quality: Adherent Slough Fat Layer Exposed: No Tendon Exposed: No [name], [name] ([id number]) Muscle Exposed: No Joint Exposed: No Bone Exposed: No Limited to Skin Breakdown Periwound Skin Texture Texture Color No Abnormalities Noted: No No Abnormalities Noted: No Callus: No Atrophie Blanche: No Crepitus: No Cyanosis: No Excoriation: No Ecchymosis: No Fluctuance: No Erythema: Yes Friable: No Erythema Location: Circumferential Induration: No Hemosiderin Staining: No Localized Edema: Yes Mottled: No Rash: No Pallor: No Scarring: No Rubor: No Moisture No Abnormalities Noted: No Dry / Scaly: No Maceration: No Moist: Yes Wound Preparation Ulcer Cleansing: Rinsed/Irrigated with Saline Topical Anesthetic Applied: Other: liodocaine 4%, Electronic Signature(s) Signed: 07/01/2015 10:20:02 AM By: Elpidio Eric BSN, RN Entered By: Elpidio Eric on 07/01/2015 10:20:02 [name] ([id number]) -------------------------------------------------------------------------------- Wound Assessment Details Patient Name: [name] Date of Service: 07/01/2015 10:00 AM Medical Record Number: [id number] Patient Account Number: [id number] Date of Birth/Sex: 06-27-1921 (81 y.o. Female) Treating RN: Afful, RN, BSN, Rita Primary Care Physician: Terance Hart, DAVID Other Clinician: Referring Physician: Terance Hart, DAVID Treating Physician/Extender: Rudene Re in Treatment: 20 Wound Status Wound Number: 8 Primary Diabetic Wound/Ulcer of the Lower Etiology: Extremity Wound Location: Right Toe Fourth Wound Open Wounding Event: Gradually  Appeared Status: Date Acquired: 06/09/2015 Comorbid Arrhythmia, Deep Vein Thrombosis, Weeks Of Treatment: 1 History: Peripheral Arterial Disease, Type II Clustered Wound: No Diabetes Photos Photo Uploaded By: Elpidio Eric on 07/01/2015 14:31:57 Wound Measurements Length: (cm) 0.3 Width: (cm) 0.3 Depth: (cm) 0.1 Area: (cm) 0.071 Volume: (cm) 0.007 % Reduction in Area: 0% % Reduction in Volume: 0% Epithelialization: None Tunneling: No Undermining: No Wound Description Classification: Unable to visualize wound bed Wound Margin: Flat and Intact Exudate Amount: Small Exudate Type: Serous Exudate Color: amber Foul Odor After Cleansing: No Wound Bed Granulation Amount: None Present (0%) Exposed Structure Necrotic Amount: Large (67-100%) Fascia Exposed: No Necrotic Quality: Adherent Slough Fat Layer Exposed: No Tendon Exposed: No [name], [name] ([id number]) Muscle Exposed: No Joint Exposed: No Bone Exposed: No Limited to Skin Breakdown Periwound Skin Texture Texture Color No Abnormalities Noted: No No Abnormalities Noted: No Callus: No Atrophie Blanche: No Crepitus: No Cyanosis: No Excoriation: No Ecchymosis: No Fluctuance: No Erythema: Yes Friable: No Erythema Location: Circumferential Induration: No Hemosiderin Staining: No Localized Edema: No Mottled: No Rash: No Pallor: No Scarring: No Rubor: No Moisture Temperature / Pain No Abnormalities Noted: No Tenderness on Palpation: Yes Dry / Scaly: No Maceration: No Moist: Yes Wound Preparation Ulcer Cleansing: Rinsed/Irrigated with Saline Topical Anesthetic Applied: Other: lidocaine 4%, Electronic Signature(s) Signed: 07/01/2015 10:22:24 AM By: Elpidio Eric  BSN, RN Entered By: Elpidio Eric on 07/01/2015 10:22:23 [name] ([id number]) -------------------------------------------------------------------------------- Vitals Details Patient Name: [name] Date of Service: 07/01/2015 10:00  AM Medical Record Number: [id number] Patient Account Number: [id number] Date of Birth/Sex: [birth year] (81 y.o. Female) Treating RN: Afful, RN, BSN, Oblong Sink Primary Care Physician: Terance Hart, DAVID Other Clinician: Referring Physician: Terance Hart, DAVID Treating Physician/Extender: Rudene Re in Treatment: 20 Vital Signs Time Taken: 10:03 Temperature (F): 97.8 Height (in): 62 Pulse (bpm): 48 Weight (lbs): 155 Respiratory Rate (breaths/min): 17 Body Mass Index (BMI): 28.3 Blood Pressure (mmHg): 123/53 Reference Range: 80 - 120 mg / dl Electronic Signature(s) Signed: 07/01/2015 10:05:00 AM By: Elpidio Eric BSN, RN Previous Signature: 07/01/2015 10:04:35 AM Version By: Elpidio Eric BSN, RN Entered By: Elpidio Eric on 07/01/2015 10:05:00

## 2015-07-01 NOTE — Progress Notes (Signed)
[name], [name] ([id number]) Visit Report for 07/01/2015 Chief Complaint Document Details Patient Name: [name] Date of Service: 07/01/2015 10:00 AM Medical Record Number: [id number] Patient Account Number: [id number] Date of Birth/Sex: March 10, 1921 (81 y.o. Female) Treating RN: Clover Mealy, RN, BSN, [place] Sink Primary Care Physician: Dorothey Baseman Other Clinician: Referring Physician: Dorothey Baseman Treating Physician/Extender: Rudene Re in Treatment: 20 Information Obtained from: Patient Chief Complaint Patient presents to the wound care center for a consult due non healing wound. 81 year old patient with comes with a history of a ulcerated area to the right third toe and the right heel which she's had for about 2 months. Electronic Signature(s) Signed: 07/01/2015 11:23:42 AM By: Evlyn Kanner MD, FACS Entered By: Evlyn Kanner on 07/01/2015 11:23:41 [name] ([id number]) -------------------------------------------------------------------------------- HPI Details Patient Name: [name] Date of Service: 07/01/2015 10:00 AM Medical Record Number: [id number] Patient Account Number: [id number] Date of Birth/Sex: 21-Dec-1920 (81 y.o. Female) Treating RN: Afful, RN, BSN, [place] Sink Primary Care Physician: Dorothey Baseman Other Clinician: Referring Physician: Terance Hart, DAVID Treating Physician/Extender: Rudene Re in Treatment: 20 History of Present Illness Location: right medial heel and right third toe Quality: Patient reports experiencing a dull pain to affected area(s). Severity: Patient states wound are getting worse. Duration: Patient has had the wound for > 3 months prior to seeking treatment at the wound center Timing: Pain in wound is Intermittent (comes and goes Context: The wound appeared gradually over time Modifying Factors: Consults to this date include:vascular surgeon and several recent surgeries. Associated Signs and Symptoms: Patient reports  having foul odor and drainage from the [name]. HPI Description: A pleasant 81 year old patient who is known to have diabetes mellitus for several years has recently gone through a series of operations with the vascular surgeon Dr. Gilda Crease. In January and February she had several surgeries on [name]. 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 On 01/07/2015 she also had a right lower extremity distal runoff with a angioplasty of the right anterior tibial artery to maximize her blood flow to the foot. She recently had her staples removed at Dr. Marijean Heath office on 01/31/2015 and at that time a right lower extremity duplex was done which showed patent vessels and a patent stent with distal occluded posterior tibial artery. Though the duplex was noncritical the recommendations from the PA at the vascular surgery office was that of a angiogram to be done but the patient said she would rather try some bone care before. The patient has received doxycycline and Cipro in the recent past and she takes oral medications for her diabetes. Other than that I reviewed her list of all her medications. No recent hemoglobin A1c has been done and no recent x-rays of the right foot have been done. 02/18/2015 -- x-ray of the right foot was done on 02/11/2015 and it shows no evidence of acute osteomyelitis of the third toe or of the calcaneus. She had gone to the vascular surgery office and they had noted that there is dehiscence of [name] of the below-knee wound and they have asked [place] to kindly take over the care of this. There've been doing dressings for the right heel and right third toe. 02/25/2015 -- we have received notes from the vascular group who saw her last on 02/13/2015 and she was  seen  by the PA Ms. Cleda Daub. She had recommended that the patient continue to follow with [place] for wound care including management of [name], which has had dehiscence of the lateral part. They will consider repeating a arterial duplex or angiogram if the right lower extremity does not heal within a reasonable period of time. 03/18/2015 -- he saw the vascular surgeon Dr. Gilda Crease and he has set her up for an angioplasty sometime in the middle of July. she is doing well otherwise. [name] ([id number]) 04/10/2015 -- the patient had a procedure done on 04/08/2015 and this was a right angioplasty of the dorsalis pedis, anterior tibial artery, first superficial femoral artery in its midportion. There was successful intervention with recanalization and in-line flow to the right foot. 04/22/2015 -- the patient was looking rather pale today and the daughter confirms that her hemoglobin was down to 6.9. she is being monitored by her PCP. Her Lasix dose was increased and her edema has gone down significantly. 04/29/2015 -- she had vascular studies done and the ABI on the right is 1.3 and the toe pressures were within normal limits. Her hemoglobin is still around 7 and she refuses to take any blood transfusions for religious reasons. Her potassium was normal. 06/10/2015 -- she has a new blister on her right lateral and posterior part of her leg just in the region where the Kerlix bandages were applied and this may be due to an abrasion. 06/24/2015 -- On her right lower extremity she has developed several more blisters which look like small pustules and the drain informed shallow ulcerations. 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 07/01/2015 -- she has an appointment with the PCP this Friday and her vascular surgeon on Monday. Other than that she is on doxycycline and has finished 1 week of treatment. The pustules she had all over have now resolved. Electronic Signature(s) Signed:  07/01/2015 11:25:33 AM By: Evlyn Kanner MD, FACS Entered By: Evlyn Kanner on 07/01/2015 11:25:32 [name] ([id number]) -------------------------------------------------------------------------------- Physical Exam Details Patient Name: [name] Date of Service: 07/01/2015 10:00 AM Medical Record Number: [id number] Patient Account Number: [id number] Date of Birth/Sex: 06-13-1921 (81 y.o. Female) Treating RN: Clover Mealy, RN, BSN, [place] Sink Primary Care Physician: Dorothey Baseman Other Clinician: Referring Physician: Terance Hart, DAVID Treating Physician/Extender: Rudene Re in Treatment: 20 Constitutional . Pulse regular. Respirations normal and unlabored. Afebrile. . Eyes Nonicteric. Reactive to light. Ears, Nose, Mouth, and Throat Lips, teeth, and gums WNL.Marland Kitchen Moist mucosa without lesions . Neck supple and nontender. No palpable supraclavicular or cervical adenopathy. Normal sized without goiter. Respiratory WNL. No retractions.. Cardiovascular Pedal Pulses WNL. No clubbing, cyanosis or edema. Lymphatic No adneopathy. No adenopathy. No adenopathy. Musculoskeletal Adexa without tenderness or enlargement.. Digits and nails w/o clubbing, cyanosis, infection, petechiae, ischemia, or inflammatory conditions.. Integumentary (Hair, Skin) No suspicious lesions. No crepitus or fluctuance. No peri-wound warmth or erythema. No masses.Marland Kitchen Psychiatric Judgement and insight Intact.. No evidence of depression, anxiety, or agitation.. Notes The right lower extremity pustules have now gone down and she has some shallow ulcerations which are quite clean. The one area on the lower anterior aspect of her right shin still has slough and is very tender and we will use Santyl daily. Electronic Signature(s) Signed: 07/01/2015 11:26:57 AM By: Evlyn Kanner MD, FACS Entered By: Evlyn Kanner on 07/01/2015 11:26:55 [name]  ([id number]) -------------------------------------------------------------------------------- Physician Orders Details Patient Name: [name] Date of Service: 07/01/2015 10:00 AM Medical Record Number: [id number] Patient Account Number: [id number] Date of Birth/Sex:  10/27/1920 (81 y.o. Female) Treating RN: Afful, RN, BSN, [place] Sink Primary Care Physician: Dorothey Baseman Other Clinician: Referring Physician: Terance Hart DAVID Treating Physician/Extender: Rudene Re in Treatment: 20 Verbal / Phone Orders: Yes Clinician: Afful, RN, BSN, Rita Read Back and Verified: Yes Diagnosis Coding Wound Cleansing Wound #1 Right Calcaneous o Cleanse wound with mild soap and water o May Shower, gently pat wound dry prior to applying new dressing. o May shower with protection. Wound #6 Right,Posterior Lower Leg o Cleanse wound with mild soap and water o May Shower, gently pat wound dry prior to applying new dressing. o May shower with protection. Wound #7 Right,Anterior Lower Leg o Cleanse wound with mild soap and water o May Shower, gently pat wound dry prior to applying new dressing. o May shower with protection. Wound #8 Right Toe Fourth o Cleanse wound with mild soap and water o May Shower, gently pat wound dry prior to applying new dressing. o May shower with protection. Primary Wound Dressing Wound #1 Right Calcaneous o Aquacel Ag Wound #6 Right,Posterior Lower Leg o Aquacel Ag Wound #7 Right,Anterior Lower Leg o Santyl Ointment Wound #8 Right Toe Fourth o Aquacel Ag Secondary Dressing Wound #1 Right Calcaneous o ABD pad Langhans, Vernee ([id number]) o Gauze and Kerlix/Conform Wound #6 Right,Posterior Lower Leg o ABD pad o Gauze and Kerlix/Conform Wound #7 Right,Anterior Lower Leg o ABD pad o Gauze and Kerlix/Conform Wound #8 Right Toe Fourth o ABD pad o Gauze and Kerlix/Conform Dressing Change Frequency Wound #1 Right  Calcaneous o Change dressing every other day. Wound #6 Right,Posterior Lower Leg o Change dressing every other day. Wound #7 Right,Anterior Lower Leg o Change dressing every other day. Wound #8 Right Toe Fourth o Change dressing every other day. Follow-up Appointments Wound #1 Right Calcaneous o Return Appointment in 1 week. Wound #6 Right,Posterior Lower Leg o Return Appointment in 1 week. Wound #7 Right,Anterior Lower Leg o Return Appointment in 1 week. Wound #8 Right Toe Fourth o Return Appointment in 1 week. Home Health Wound #1 Right Calcaneous o Continue Home Health Visits - [facility] o Home Health Nurse may visit PRN to address patientos wound care needs. o FACE TO FACE ENCOUNTER: MEDICARE and MEDICAID PATIENTS: I certify that this patient is under my care and that I had a face-to-face encounter that meets the physician face-to-face encounter requirements with this patient on this date. The encounter with the patient was in whole or in part for the following MEDICAL CONDITION: (primary reason for Home Healthcare) [name], [name] ([id number]) MEDICAL NECESSITY: I certify, that based on my findings, NURSING services are a medically necessary home health service. HOME BOUND STATUS: I certify that my clinical findings support that this patient is homebound (i.e., Due to illness or injury, pt requires aid of supportive devices such as crutches, cane, wheelchairs, walkers, the use of special transportation or the assistance of another person to leave their place of residence. There is a normal inability to leave the home and doing so requires considerable and taxing effort. Other absences are for medical reasons / religious services and are infrequent or of short duration when for other reasons). o If current dressing causes regression in wound condition, may D/C ordered dressing product/s and apply Normal Saline Moist Dressing daily until next Wound Healing  Center / Other MD appointment. Notify Wound Healing Center of regression in wound condition at [phone]. o Please direct any NON-WOUND related issues/requests for orders to patient's Primary Care Physician Wound #6 Right,Posterior Lower Leg o  Continue Home Health Visits - [facility] o Home Health Nurse may visit PRN to address patientos wound care needs. o FACE TO FACE ENCOUNTER: MEDICARE and MEDICAID PATIENTS: I certify that this patient is under my care and that I had a face-to-face encounter that meets the physician face-to-face encounter requirements with this patient on this date. The encounter with the patient was in whole or in part for the following MEDICAL CONDITION: (primary reason for Home Healthcare) MEDICAL NECESSITY: I certify, that based on my findings, NURSING services are a medically necessary home health service. HOME BOUND STATUS: I certify that my clinical findings support that this patient is homebound (i.e., Due to illness or injury, pt requires aid of supportive devices such as crutches, cane, wheelchairs, walkers, the use of special transportation or the assistance of another person to leave their place of residence. There is a normal inability to leave the home and doing so requires considerable and taxing effort. Other absences are for medical reasons / religious services and are infrequent or of short duration when for other reasons). o If current dressing causes regression in wound condition, may D/C ordered dressing product/s and apply Normal Saline Moist Dressing daily until next Wound Healing Center / Other MD appointment. Notify Wound Healing Center of regression in wound condition at [phone]. o Please direct any NON-WOUND related issues/requests for orders to patient's Primary Care Physician Wound #7 Right,Anterior Lower Leg o Continue Home Health Visits - [facility] o Home Health Nurse may visit PRN to address patientos wound care  needs. o FACE TO FACE ENCOUNTER: MEDICARE and MEDICAID PATIENTS: I certify that this patient is under my care and that I had a face-to-face encounter that meets the physician face-to-face encounter requirements with this patient on this date. The encounter with the patient was in whole or in part for the following MEDICAL CONDITION: (primary reason for Home Healthcare) MEDICAL NECESSITY: I certify, that based on my findings, NURSING services are a medically necessary home health service. HOME BOUND STATUS: I certify that my clinical findings support that this patient is homebound (i.e., Due to illness or injury, pt requires aid of supportive devices such as crutches, cane, wheelchairs, walkers, the use of special transportation or the assistance of another person to leave their place of residence. There is a normal inability to leave the home and doing so requires considerable and taxing effort. Other absences are for medical reasons / religious services and are infrequent or of short duration when for other reasons). [name], [name] ([id number]) o If current dressing causes regression in wound condition, may D/C ordered dressing product/s and apply Normal Saline Moist Dressing daily until next Wound Healing Center / Other MD appointment. Notify Wound Healing Center of regression in wound condition at [phone]. o Please direct any NON-WOUND related issues/requests for orders to patient's Primary Care Physician Wound #8 Right Toe Fourth o Continue Home Health Visits - [facility] o Home Health Nurse may visit PRN to address patientos wound care needs. o FACE TO FACE ENCOUNTER: MEDICARE and MEDICAID PATIENTS: I certify that this patient is under my care and that I had a face-to-face encounter that meets the physician face-to-face encounter requirements with this patient on this date. The encounter with the patient was in whole or in part for the following MEDICAL CONDITION:  (primary reason for Home Healthcare) MEDICAL NECESSITY: I certify, that based on my findings, NURSING services are a medically necessary home health service. HOME BOUND STATUS: I certify that my clinical findings support that  this patient is homebound (i.e., Due to illness or injury, pt requires aid of supportive devices such as crutches, cane, wheelchairs, walkers, the use of special transportation or the assistance of another person to leave their place of residence. There is a normal inability to leave the home and doing so requires considerable and taxing effort. Other absences are for medical reasons / religious services and are infrequent or of short duration when for other reasons). o If current dressing causes regression in wound condition, may D/C ordered dressing product/s and apply Normal Saline Moist Dressing daily until next Wound Healing Center / Other MD appointment. Notify Wound Healing Center of regression in wound condition at [phone]. o Please direct any NON-WOUND related issues/requests for orders to patient's Primary Care Physician Medications-please add to medication list. Wound #1 Right Calcaneous o P.O. Antibiotics - doxycycline Wound #6 Right,Posterior Lower Leg o P.O. Antibiotics - doxycycline Wound #7 Right,Anterior Lower Leg o P.O. Antibiotics - doxycycline o Santyl Enzymatic Ointment Wound #8 Right Toe Fourth o P.O. Antibiotics - doxycycline Electronic Signature(s) Signed: 07/01/2015 10:35:33 AM By: Elpidio Eric BSN, RN Signed: 07/01/2015 12:55:57 PM By: Evlyn Kanner MD, FACS Entered By: Elpidio Eric on 07/01/2015 10:35:32 [name] ([id number]) -------------------------------------------------------------------------------- Problem List Details Patient Name: [name] Date of Service: 07/01/2015 10:00 AM Medical Record Number: [id number] Patient Account Number: [id number] Date of Birth/Sex: April 26, 1921 (81 y.o. 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 02/11/2015 Yes fat layer exposed L97.812 Non-pressure chronic ulcer of other part of right lower leg 02/11/2015 Yes with fat layer exposed T81.31XA Disruption of external operation (surgical) wound, not 02/18/2015 Yes elsewhere classified, initial encounter Inactive Problems Resolved Problems Electronic Signature(s) Signed: 07/01/2015 11:22:55 AM By: Evlyn Kanner MD, FACS Entered By: Evlyn Kanner on 07/01/2015 11:22:54 [name] ([id number]) -------------------------------------------------------------------------------- Progress Note Details Patient Name: [name] Date of Service: 07/01/2015 10:00 AM Medical Record Number: [id number] Patient Account Number: [id number] Date of Birth/Sex: [birth year] (81 y.o. Female) Treating RN: Clover Mealy, RN, BSN, [place] Sink Primary Care Physician: Dorothey Baseman Other Clinician: Referring Physician: Dorothey Baseman Treating Physician/Extender: Rudene Re in Treatment: 20 Subjective Chief Complaint Information obtained from Patient Patient presents to the wound care center for a consult due non healing wound. 81 year old patient with comes with a history of a ulcerated area to the right third toe and the right heel which she's had for about 2 months. History of Present Illness (HPI) The following HPI elements were documented for the patient's wound: Location: right medial heel and  right third toe Quality: Patient reports experiencing a dull pain to affected area(s). Severity: Patient states wound are getting worse. Duration: Patient has had the wound for > 3 months prior to seeking treatment at the wound center Timing: Pain in wound is Intermittent (comes and goes Context: The wound appeared gradually over time Modifying Factors: Consults to this date include:vascular surgeon and several recent surgeries. Associated Signs and Symptoms: Patient reports having foul odor and drainage from the [name]. A pleasant 81 year old patient who is known to have diabetes mellitus for several  years has recently gone through a series of operations with the vascular surgeon Dr. Gilda Crease. In January and February she had several surgeries on [name]. 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 On 01/07/2015 she also had a right lower extremity distal runoff with a angioplasty of the right anterior tibial artery to maximize her blood flow to the foot. She recently had her staples removed at Dr. Marijean Heath office on 01/31/2015 and at that time a right lower extremity duplex was done which showed patent vessels and a patent stent with distal occluded posterior tibial artery. Though the duplex was noncritical the recommendations from the PA at the vascular surgery office was that of a angiogram to be done but the patient said she would rather try some bone care before. The patient has received doxycycline and Cipro in the recent past and she takes oral medications for her diabetes. Other than that I reviewed her list of all her medications. No recent hemoglobin A1c has been done and no recent x-rays of the right foot have been done. 02/18/2015 -- x-ray of the right foot was done on 02/11/2015 and it  shows no evidence of acute osteomyelitis of the third toe or of the calcaneus. She had gone to the vascular surgery office and they had noted that there is dehiscence of [name] of the below-knee wound and they have asked [place] to kindly take over the care of this. There've been doing dressings for the right heel and right third toe. LEJLA, MOESER ([id number]) 02/25/2015 -- we have received notes from the vascular group who saw her last on 02/13/2015 and she was seen by the PA Ms. Cleda Daub. She had recommended that the patient continue to follow with [place] for wound care including management of [name], which has had dehiscence of the lateral part. They will consider repeating a arterial duplex or angiogram if the right lower extremity does not heal within a reasonable period of time. 03/18/2015 -- he saw the vascular surgeon Dr. Gilda Crease and he has set her up for an angioplasty sometime in the middle of July. she is doing well otherwise. 04/10/2015 -- the patient had a procedure done on 04/08/2015 and this was a right angioplasty of the dorsalis pedis, anterior tibial artery, first superficial femoral artery in its midportion. There was successful intervention with recanalization and in-line flow to the right foot. 04/22/2015 -- the patient was looking rather pale today and the daughter confirms that her hemoglobin was down to 6.9. she is being monitored by her PCP. Her Lasix dose was increased and her edema has gone down significantly. 04/29/2015 -- she had vascular studies done and the ABI on the right is 1.3 and the toe pressures were within normal limits. Her hemoglobin is still around 7 and she refuses to take any blood transfusions for religious reasons. Her potassium was normal. 06/10/2015 -- she has a new blister on her right lateral and posterior part of her leg just in the region where the Kerlix bandages were applied and this may be due  to an abrasion. 06/24/2015 -- On her right lower extremity she has developed several more blisters which look like small pustules and the drain informed shallow ulcerations. 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 07/01/2015 -- she has an appointment with the  PCP this Friday and her vascular surgeon on Monday. Other than that she is on doxycycline and has finished 1 week of treatment. The pustules she had all over have now resolved. Objective Constitutional Pulse regular. Respirations normal and unlabored. Afebrile. Vitals Time Taken: 10:03 AM, Height: 62 in, Weight: 155 lbs, BMI: 28.3, Temperature: 97.8 F, Pulse: 48 bpm, Respiratory Rate: 17 breaths/min, Blood Pressure: 123/53 mmHg. Eyes Nonicteric. Reactive to light. Ears, Nose, Mouth, and Throat Lips, teeth, and gums WNL.Marland Kitchen Moist mucosa without lesions . [name], [name] ([id number]) Neck supple and nontender. No palpable supraclavicular or cervical adenopathy. Normal sized without goiter. Respiratory WNL. No retractions.. Cardiovascular Pedal Pulses WNL. No clubbing, cyanosis or edema. Lymphatic No adneopathy. No adenopathy. No adenopathy. Musculoskeletal Adexa without tenderness or enlargement.. Digits and nails w/o clubbing, cyanosis, infection, petechiae, ischemia, or inflammatory conditions.Marland Kitchen Psychiatric Judgement and insight Intact.. No evidence of depression, anxiety, or agitation.. General Notes: The right lower extremity pustules have now gone down and she has some shallow ulcerations which are quite clean. The one area on the lower anterior aspect of her right shin still has slough and is very tender and we will use Santyl daily. Integumentary (Hair, Skin) No suspicious lesions. No crepitus or fluctuance. No peri-wound warmth or erythema. No masses.. Wound #1 status is Open. Original cause of wound was Gradually Appeared. The wound is located on the Right Calcaneous. The wound measures 0.8cm length x 1cm width x 0.1cm  depth; 0.628cm^2 area and 0.063cm^3 volume. The wound is limited to skin breakdown. There is no tunneling or undermining noted. There is a medium amount of serosanguineous drainage noted. The wound margin is distinct with the outline attached to the wound base. There is medium (34-66%) pink, pale granulation within the wound bed. There is a medium (34-66%) amount of necrotic tissue within the wound bed. The periwound skin appearance exhibited: Dry/Scaly, Moist. The periwound skin appearance did not exhibit: Callus, Crepitus, Excoriation, Fluctuance, Friable, Induration, Localized Edema, Rash, Scarring, Maceration, Atrophie Blanche, Cyanosis, Ecchymosis, Hemosiderin Staining, Mottled, Pallor, Rubor, Erythema. Wound #6 status is Open. Original cause of wound was Blister. The wound is located on the Right,Posterior Lower Leg. The wound measures 1cm length x 3cm width x 0.1cm depth; 2.356cm^2 area and 0.236cm^3 volume. The wound is limited to skin breakdown. There is no tunneling or undermining noted. There is a small amount of serosanguineous drainage noted. The wound margin is distinct with the outline attached to the wound base. There is medium (34-66%) pink, pale granulation within the wound bed. There is a small (1-33%) amount of necrotic tissue within the wound bed including Adherent Slough. The periwound skin appearance exhibited: Localized Edema, Moist. The periwound skin appearance did not exhibit: Callus, Crepitus, Excoriation, Fluctuance, Friable, Induration, Rash, Scarring, Dry/Scaly, Maceration, Atrophie Blanche, Cyanosis, Ecchymosis, Hemosiderin Staining, Mottled, Pallor, Rubor, Erythema. Periwound temperature was noted as No Abnormality. The periwound has tenderness on palpation. Wound #7 status is Open. Original cause of wound was Gradually Appeared. The wound is located on the Right,Anterior Lower Leg. The wound measures 0.4cm length x 0.5cm width x 0.1cm depth; 0.157cm^2 area and  0.016cm^3 volume. The wound is limited to skin breakdown. There is no tunneling or undermining [name], [name] ([id number]) noted. There is a small amount of serous drainage noted. The wound margin is flat and intact. There is no granulation within the wound bed. There is a large (67-100%) amount of necrotic tissue within the wound bed including Adherent Slough. The periwound skin appearance exhibited: Localized Edema, Moist,  Erythema. The periwound skin appearance did not exhibit: Callus, Crepitus, Excoriation, Fluctuance, Friable, Induration, Rash, Scarring, Dry/Scaly, Maceration, Atrophie Blanche, Cyanosis, Ecchymosis, Hemosiderin Staining, Mottled, Pallor, Rubor. The surrounding wound skin color is noted with erythema which is circumferential. Wound #8 status is Open. Original cause of wound was Gradually Appeared. The wound is located on the Right Toe Fourth. The wound measures 0.3cm length x 0.3cm width x 0.1cm depth; 0.071cm^2 area and 0.007cm^3 volume. The wound is limited to skin breakdown. There is no tunneling or undermining noted. There is a small amount of serous drainage noted. The wound margin is flat and intact. There is no granulation within the wound bed. There is a large (67-100%) amount of necrotic tissue within the wound bed including Adherent Slough. The periwound skin appearance exhibited: Moist, Erythema. The periwound skin appearance did not exhibit: Callus, Crepitus, Excoriation, Fluctuance, Friable, Induration, Localized Edema, Rash, Scarring, Dry/Scaly, Maceration, Atrophie Blanche, Cyanosis, Ecchymosis, Hemosiderin Staining, Mottled, Pallor, Rubor. The surrounding wound skin color is noted with erythema which is circumferential. The periwound has tenderness on palpation. 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 I have recommended silver alginate on all these wounds. The one area on the lower anterior aspect of her right shin still has slough and is very tender and we will use Santyl daily. She will complete her course of antibiotics and see me next week. Plan Wound Cleansing: Wound #1 Right Calcaneous: [name], [name] ([id number]) Cleanse wound with mild soap and water May Shower, gently pat wound dry prior to applying new dressing. May shower with protection. Wound #6 Right,Posterior Lower Leg: Cleanse wound with mild soap and water May Shower, gently pat wound dry prior to applying new dressing. May shower with protection. Wound #7 Right,Anterior Lower Leg: Cleanse wound with mild soap and water May Shower, gently pat wound dry prior to applying new dressing. May shower with protection. Wound #8 Right Toe Fourth: Cleanse wound with mild soap and water May Shower, gently pat wound dry prior to applying new dressing. May shower with protection. Primary Wound Dressing: Wound #1 Right Calcaneous: Aquacel Ag Wound #6 Right,Posterior Lower Leg: Aquacel Ag Wound #7 Right,Anterior Lower Leg: Santyl Ointment Wound #8 Right Toe Fourth: Aquacel Ag Secondary Dressing: Wound #1 Right Calcaneous: ABD pad Gauze and Kerlix/Conform Wound #6 Right,Posterior Lower Leg: ABD pad Gauze and Kerlix/Conform Wound #7 Right,Anterior Lower Leg: ABD pad Gauze and Kerlix/Conform Wound #8 Right Toe Fourth: ABD pad Gauze and Kerlix/Conform Dressing Change Frequency: Wound  #1 Right Calcaneous: Change dressing every other day. Wound #6 Right,Posterior Lower Leg: Change dressing every other day. Wound #7 Right,Anterior Lower Leg: Change dressing every other day. Wound #8 Right Toe Fourth: Change dressing every other day. Follow-up Appointments: Wound #1 Right Calcaneous: Return Appointment in 1 week. Wound #6 Right,Posterior Lower Leg: Return Appointment in 1 week. [name], [name] ([id number]) Wound #7 Right,Anterior Lower Leg: Return Appointment in 1 week. Wound #8 Right Toe Fourth: Return Appointment in 1  week. Home Health: Wound #1 Right Calcaneous: Continue Home Health Visits - [facility] Nurse may visit PRN to address patient s wound care needs. FACE TO FACE ENCOUNTER: MEDICARE and MEDICAID PATIENTS: I certify that this patient is under my care and that I had a face-to-face encounter that meets the physician face-to-face encounter requirements with this patient on this date. The encounter with the patient was in whole or in part for the following MEDICAL CONDITION: (primary reason for Home Healthcare) MEDICAL NECESSITY: I certify, that based on my findings, NURSING services are a medically necessary home health service. HOME BOUND STATUS: I certify that my clinical findings support that this patient is homebound (i.e., Due to illness or injury, pt requires aid of supportive devices such as crutches, cane, wheelchairs, walkers, the use of special transportation or the assistance of another person to leave their place of residence. There is a normal inability to leave the home and doing so requires considerable and taxing effort. Other absences are for medical reasons / religious services and are infrequent or of short duration when for other reasons). If current dressing causes regression in wound condition, may D/C ordered dressing product/s and apply Normal Saline Moist Dressing daily until next Wound Healing Center / Other MD appointment.  Notify Wound Healing Center of regression in wound condition at [phone]. Please direct any NON-WOUND related issues/requests for orders to patient's Primary Care Physician Wound #6 Right,Posterior Lower Leg: Continue Home Health Visits - [facility] Health Nurse may visit PRN to address patient s wound care needs. FACE TO FACE ENCOUNTER: MEDICARE and MEDICAID PATIENTS: I certify that this patient is under my care and that I had a face-to-face encounter that meets the physician face-to-face encounter requirements with this patient on this date. The encounter with the patient was in whole or in part for the following MEDICAL CONDITION: (primary reason for Home Healthcare) MEDICAL NECESSITY: I certify, that based on my findings, NURSING services are a medically necessary home health service. HOME BOUND STATUS: I certify that my clinical findings support that this patient is homebound (i.e., Due to illness or injury, pt requires aid of supportive devices such as crutches, cane, wheelchairs, walkers, the use of special transportation or the assistance of another person to leave their place of residence. There is a normal inability to leave the home and doing so requires considerable and taxing effort. Other absences are for medical reasons / religious services and are infrequent or of short duration when for other reasons). If current dressing causes regression in wound condition, may D/C ordered dressing product/s and apply Normal Saline Moist Dressing daily until next Wound Healing Center / Other MD appointment. Notify Wound Healing Center of regression in wound condition at [phone]. Please direct any NON-WOUND related issues/requests for orders to patient's Primary Care Physician Wound #7 Right,Anterior Lower Leg: Continue Home Health Visits - [facility] Nurse may visit PRN to address patient s wound care needs. FACE TO FACE ENCOUNTER: MEDICARE and MEDICAID PATIENTS: I  certify that this patient is under my care and that I had a face-to-face encounter that meets the physician face-to-face encounter requirements with this patient on this date. The encounter with the patient was in whole or in part for the following MEDICAL CONDITION: (primary reason for Home Healthcare) MEDICAL NECESSITY: I certify, that based on my findings, NURSING services are a medically necessary home health service. HOME BOUND STATUS: I certify that my clinical findings support that this patient is homebound (i.e.,  Due to illness or injury, pt requires aid of supportive devices such as crutches, cane, wheelchairs, walkers, the use of special transportation or the assistance of another person to leave their place of residence. There is a normal inability to leave the home and doing so requires considerable and taxing effort. Other absences are [name], [name] ([id number]) for medical reasons / religious services and are infrequent or of short duration when for other reasons). If current dressing causes regression in wound condition, may D/C ordered dressing product/s and apply Normal Saline Moist Dressing daily until next Wound Healing Center / Other MD appointment. Notify Wound Healing Center of regression in wound condition at [phone]. Please direct any NON-WOUND related issues/requests for orders to patient's Primary Care Physician Wound #8 Right Toe Fourth: Continue Home Health Visits - [facility] Nurse may visit PRN to address patient s wound care needs. FACE TO FACE ENCOUNTER: MEDICARE and MEDICAID PATIENTS: I certify that this patient is under my care and that I had a face-to-face encounter that meets the physician face-to-face encounter requirements with this patient on this date. The encounter with the patient was in whole or in part for the following MEDICAL CONDITION: (primary reason for Home Healthcare) MEDICAL NECESSITY: I certify, that based on my findings,  NURSING services are a medically necessary home health service. HOME BOUND STATUS: I certify that my clinical findings support that this patient is homebound (i.e., Due to illness or injury, pt requires aid of supportive devices such as crutches, cane, wheelchairs, walkers, the use of special transportation or the assistance of another person to leave their place of residence. There is a normal inability to leave the home and doing so requires considerable and taxing effort. Other absences are for medical reasons / religious services and are infrequent or of short duration when for other reasons). If current dressing causes regression in wound condition, may D/C ordered dressing product/s and apply Normal Saline Moist Dressing daily until next Wound Healing Center / Other MD appointment. Notify Wound Healing Center of regression in wound condition at [phone]. Please direct any NON-WOUND related issues/requests for orders to patient's Primary Care Physician Medications-please add to medication list.: Wound #1 Right Calcaneous: P.O. Antibiotics - doxycycline Wound #6 Right,Posterior Lower Leg: P.O. Antibiotics - doxycycline Wound #7 Right,Anterior Lower Leg: P.O. Antibiotics - doxycycline Santyl Enzymatic Ointment Wound #8 Right Toe Fourth: P.O. Antibiotics - doxycycline The right lower extremity pustules have now gone down and she has some shallow ulcerations which are quite clean. I have recommended silver alginate on all these wounds. The one area on the lower anterior aspect of her right shin still has slough and is very tender and we will use Santyl daily. She will complete her course of antibiotics and see me next week. Electronic Signature(s) Signed: 07/01/2015 11:28:12 AM By: Evlyn Kanner MD, FACS Entered By: Evlyn Kanner on 07/01/2015 11:28:12 [name] ([id number] [name], [name]  ([id number]) -------------------------------------------------------------------------------- SuperBill Details Patient Name: [name] Date of Service: 07/01/2015 Medical Record Number: [id number] Patient Account Number: [id number] Date of Birth/Sex: 08-Apr-1921 (81 y.o. 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 L97.412 Non-pressure chronic ulcer of right heel and midfoot with fat  layer exposed L97.812 Non-pressure chronic ulcer of other part of right lower leg with fat layer exposed Disruption of external operation (surgical) wound, not elsewhere classified, initial T81.31XA encounter Facility Procedures CPT4 Code: 16109604 Description: 99213 - WOUND CARE VISIT-LEV 3 EST PT Modifier: Quantity: 1 Physician Procedures CPT4: Description Modifier Quantity Code [id number] 99213 - WC PHYS LEVEL 3 - EST PT 1 ICD-10 Description Diagnosis E11.621 Type 2 diabetes mellitus with foot ulcer I70.235 Atherosclerosis of native arteries of right leg with ulceration of other part of  foot 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

## 2015-07-08 ENCOUNTER — Encounter: Payer: Medicare Other | Admitting: Surgery

## 2015-07-08 ENCOUNTER — Other Ambulatory Visit: Payer: Self-pay | Admitting: Vascular Surgery

## 2015-07-08 DIAGNOSIS — L97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: Secondary | ICD-10-CM | POA: Diagnosis not present

## 2015-07-09 NOTE — Progress Notes (Signed)
[name], [name] ([id number]) Visit Report for 07/08/2015 Arrival Information Details Patient Name: [name], [name] Date of Service: 07/08/2015 10:45 AM Medical Record Number: [id number] Patient Account Number: [id number] Date of Birth/Sex: [birth year] (81 y.o. Female) Treating RN: Huel Coventry Primary Care Physician: Dorothey Baseman Other Clinician: Referring Physician: Dorothey Baseman Treating Physician/Extender: Rudene Re in Treatment: 21 Visit Information History Since Last Visit Added or deleted any medications: No Patient Arrived: Wheel Chair Any new allergies or adverse reactions: No Arrival Time: 10:18 Had a fall or experienced change in No Accompanied By: daughter in activities of daily living that may affect law risk of falls: Transfer Assistance: Manual Signs or symptoms of abuse/neglect since last No Patient Identification Verified: Yes visito Secondary Verification Process Yes Hospitalized since last visit: No Completed: Pain Present Now: No Patient Has Alerts: Yes Patient Alerts: DMII Electronic Signature(s) Signed: 07/08/2015 5:21:15 PM By: Elliot Gurney, RN, BSN, Kim RN, BSN Entered By: Elliot Gurney, RN, BSN, Kim on 07/08/2015 10:18:35 [name] ([id number]) -------------------------------------------------------------------------------- Clinic Level of Care Assessment Details Patient Name: [name] Date of Service: 07/08/2015 10:45 AM Medical Record Number: [id number] Patient Account Number: [id number] Date of Birth/Sex: 1921/01/06 (81 y.o. Female) Treating RN: Huel Coventry Primary Care Physician: Terance Hart, DAVID Other Clinician: Referring Physician: Dorothey Baseman Treating Physician/Extender: Rudene Re in Treatment: 21 Clinic Level of Care Assessment Items TOOL 4 Quantity Score  - Use when only an EandM is performed on FOLLOW-UP visit 0 ASSESSMENTS - Nursing Assessment / Reassessment  - Reassessment of Co-morbidities (includes  updates in patient status) 0 X - Reassessment of Adherence to Treatment Plan 1 5 ASSESSMENTS - Wound and Skin Assessment / Reassessment  - Simple Wound Assessment / Reassessment - one wound 0 X - Complex Wound Assessment / Reassessment - multiple wounds 4 5  - Dermatologic / Skin Assessment (not related to wound area) 0 ASSESSMENTS - Focused Assessment  - Circumferential Edema Measurements - multi extremities 0  - Nutritional Assessment / Counseling / Intervention 0  - Lower Extremity Assessment (monofilament, tuning fork, pulses) 0  - Peripheral Arterial Disease Assessment (using hand held doppler) 0 ASSESSMENTS - Ostomy and/or Continence Assessment and Care  - Incontinence Assessment and Management 0  - Ostomy Care Assessment and Management (repouching, etc.) 0 PROCESS - Coordination of Care X - Simple Patient / Family Education for ongoing care 1 15  - Complex (extensive) Patient / Family Education for ongoing care 0 X - Staff obtains Chiropractor, Records, Test Results / Process Orders 1 10  - Staff telephones HHA, Nursing Homes / Clarify orders / etc 0  - Routine Transfer to another Facility (non-emergent condition) 0 [name] ([id number])  - Routine Hospital Admission (non-emergent condition) 0  - New Admissions / Manufacturing engineer / Ordering NPWT, Apligraf, etc. 0  - Emergency Hospital Admission (emergent condition) 0 X - Simple Discharge Coordination 1 10  - Complex (extensive) Discharge Coordination 0 PROCESS - Special Needs  - Pediatric / Minor Patient Management 0  - Isolation Patient Management 0  - Hearing / Language / Visual special needs 0  - Assessment of Community assistance (transportation, D/C planning, etc.) 0  - Additional assistance / Altered mentation 0  - Support Surface(s) Assessment (bed, cushion, seat, etc.) 0 INTERVENTIONS - Wound Cleansing / Measurement  - Simple Wound Cleansing - one wound 0 X -  Complex Wound Cleansing - multiple wounds 4 5 X - Wound Imaging (photographs - any number of wounds) 1 5  - Wound Tracing (instead of photographs) 0  -  Simple Wound Measurement - one wound 0 X - Complex Wound Measurement - multiple wounds 4 5 INTERVENTIONS - Wound Dressings []  - Small Wound Dressing one or multiple wounds 0 X - Medium Wound Dressing one or multiple wounds 4 15 []  - Large Wound Dressing one or multiple wounds 0 []  - Application of Medications - topical 0 []  - Application of Medications - injection 0 INTERVENTIONS - Miscellaneous []  - External ear exam 0 [name] ([id number]) []  - Specimen Collection (cultures, biopsies, blood, body fluids, etc.) 0 []  - Specimen(s) / Culture(s) sent or taken to Lab for analysis 0 []  - Patient Transfer (multiple staff / Michiel Sites Lift / Similar devices) 0 []  - Simple Staple / Suture removal (25 or less) 0 []  - Complex Staple / Suture removal (26 or more) 0 []  - Hypo / Hyperglycemic Management (close monitor of Blood Glucose) 0 []  - Ankle / Brachial Index (ABI) - do not check if billed separately 0 X - Vital Signs 1 5 Has the patient been seen at the hospital within the last three years: Yes Total Score: 170 Level Of Care: New/Established - Level 5 Electronic Signature(s) Signed: 07/08/2015 5:21:15 PM By: Elliot Gurney, RN, BSN, Kim RN, BSN Entered By: Elliot Gurney, RN, BSN, Kim on 07/08/2015 10:43:00 [name] ([id number]) -------------------------------------------------------------------------------- Encounter Discharge Information Details Patient Name: [name] Date of Service: 07/08/2015 10:45 AM Medical Record Number: [id number] Patient Account Number: [id number] Date of Birth/Sex: Dec 12, 1920 (81 y.o. Female) Treating RN: Huel Coventry Primary Care Physician: Dorothey Baseman Other Clinician: Referring Physician: Dorothey Baseman Treating Physician/Extender: Rudene Re in Treatment: 21 Encounter Discharge Information  Items Discharge Pain Level: 1 Discharge Condition: Stable Ambulatory Status: Wheelchair Discharge Destination: Home Transportation: Private Auto Accompanied By: daughter in law Schedule Follow-up Appointment: Yes Medication Reconciliation completed and provided to Patient/Care Yes Orlando Devereux: Provided on Clinical Summary of Care: 07/08/2015 Form Type Recipient Paper Patient HS Electronic Signature(s) Signed: 07/08/2015 10:57:54 AM By: Gwenlyn Perking Entered By: Gwenlyn Perking on 07/08/2015 10:57:54 [name] ([id number]) -------------------------------------------------------------------------------- Lower Extremity Assessment Details Patient Name: [name] Date of Service: 07/08/2015 10:45 AM Medical Record Number: [id number] Patient Account Number: [id number] Date of Birth/Sex: 06/05/1921 (81 y.o. Female) Treating RN: Huel Coventry Primary Care Physician: Dorothey Baseman Other Clinician: Referring Physician: Dorothey Baseman Treating Physician/Extender: Rudene Re in Treatment: 21 Vascular Assessment Pulses: Posterior Tibial Dorsalis Pedis Palpable: [Right:No] Extremity colors, hair growth, and conditions: Extremity Color: [Right:Pale] Hair Growth on Extremity: [Right:No] Temperature of Extremity: [Right:Warm] Capillary Refill: [Right:< 3 seconds] Toe Nail Assessment [name]: Right: Thick: No Discolored: Yes Deformed: No Improper Length and Hygiene: No Electronic Signature(s) Signed: 07/08/2015 5:21:15 PM By: Elliot Gurney, RN, BSN, Kim RN, BSN Entered By: Elliot Gurney, RN, BSN, Kim on 07/08/2015 10:21:58 [name] ([id number]) -------------------------------------------------------------------------------- Multi Wound Chart Details Patient Name: [name] Date of Service: 07/08/2015 10:45 AM Medical Record Number: [id number] Patient Account Number: [id number] Date of Birth/Sex: [birth year] (81 y.o. 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] Comorbid History: [1:Arrhythmia, Deep Vein Thrombosis, Peripheral Arterial Disease, Type II  Arterial Disease, Type II Arterial Disease, Type II Diabetes] [6:Arrhythmia, Deep Vein Thrombosis, Peripheral Diabetes] [7:Arrhythmia, Deep Vein Thrombosis,  Peripheral Diabetes] Date Acquired: [1:11/04/2014] [6:06/12/2015] [7:06/18/2015] Weeks of Treatment: [1:21] [6:3] [7:2] Wound Status: [1:Open] [6:Open] [7:Open] Measurements L x W x D 1x1.2x0.1 [6:0.8x0.5x0.2] [7:0.4x0.5x0.1] (cm) Area (cm) : [1:0.942] [6:0.314] [7:0.157] Volume (cm) : [1:0.094] [6:0.063] [7:0.016] % Reduction in Area: [1:82.90%] [6:60.00%] [7:33.50%] % Reduction in Volume: 82.90% [6:20.30%] [7:66.00%] Classification: [1:Full Thickness Without Exposed Support Structures] [6:Full Thickness Without Exposed Support Structures] [7:Grade 1] HBO Classification: [1:Grade 1] [6:Grade 1] [7:N/A] Exudate Amount: [1:Medium] [6:Small] [7:Small] Exudate Type: [1:Serosanguineous] [6:Serosanguineous] [7:Serous] Exudate Color: [1:red, brown] [6:red, brown] [7:amber] Wound Margin: [1:Distinct, outline attached Distinct, outline attached Flat and Intact] Granulation Amount: [1:Medium (34-66%)] [6:Small (1-33%)] [7:None Present (0%)] Granulation Quality: [1:Pink, Pale] [6:Pink, Pale]  [7:N/A] Necrotic Amount: [1:Medium (34-66%)] [6:Large (67-100%)] [7:Large (67-100%)] Necrotic Tissue: N/A Adherent Slough Adherent Slough Exposed Structures: Fascia: No Fascia: No Fascia: No Fat: No Fat: No Fat: No Tendon: No Tendon: No Tendon: No Muscle: No Muscle: No Muscle: No Joint: No Joint: No Joint: No Bone: No Bone: No Bone: No Limited to Skin Limited to Skin Limited to Skin Breakdown Breakdown Breakdown Epithelialization: Small (1-33%) Small (1-33%) None Periwound Skin Texture: Edema: No Edema: No Edema: Yes Excoriation: No Excoriation: No Excoriation: No Induration: No Induration: No Induration: No Callus: No Callus: No Callus: No Crepitus: No Crepitus: No Crepitus: No Fluctuance: No Fluctuance: No Fluctuance: No Friable: No Friable: No Friable: No Rash: No Rash: No Rash: No Scarring: No Scarring: No Scarring: No Periwound Skin Dry/Scaly: Yes Maceration: No Moist: Yes Moisture: Maceration: No Moist: No Maceration: No Moist: No Dry/Scaly: No Dry/Scaly: No Periwound Skin Color: Atrophie Blanche: No Atrophie Blanche: No Erythema: Yes Cyanosis: No Cyanosis: No Atrophie Blanche: No Ecchymosis: No Ecchymosis: No Cyanosis: No Erythema: No Erythema: No Ecchymosis: No Hemosiderin Staining: No Hemosiderin Staining: No Hemosiderin Staining: No Mottled: No Mottled: No Mottled: No Pallor: No Pallor: No Pallor: No Rubor: No Rubor: No Rubor: No Erythema Location: N/A N/A Circumferential Temperature: N/A No Abnormality N/A Tenderness on No Yes No Palpation: Wound Preparation: Ulcer Cleansing: Ulcer Cleansing: Ulcer Cleansing: Rinsed/Irrigated with Rinsed/Irrigated with Rinsed/Irrigated with Saline Saline Saline Topical Anesthetic Topical Anesthetic Topical Anesthetic Applied: Other: lidocaine Applied: Other: lidocaine Applied: Other: liodocaine 4% 4% 4% Wound Number: 8 N/A N/A Photos: No Photos N/A N/A Wound Location: Right Toe  Fourth N/A N/A Wounding Event: Gradually Appeared N/A N/A Primary Etiology: Diabetic Wound/Ulcer of N/A N/A the Lower Extremity Comorbid History: Arrhythmia, Deep Vein N/A N/A Thrombosis, Peripheral Ulatowski, [name] ([id number]) Arterial Disease, Type II Diabetes Date Acquired: 06/09/2015 N/A N/A Weeks of Treatment: 2 N/A N/A Wound Status: Open N/A N/A Measurements L x W x D 0.5x0.5x0.1 N/A N/A (cm) Area (cm) : 0.196 N/A N/A Volume (cm) : 0.02 N/A N/A % Reduction in Area: -176.10% N/A N/A % Reduction in Volume: -185.70% N/A N/A Classification: Unable to visualize wound N/A N/A bed HBO Classification: N/A N/A N/A Exudate Amount: Small N/A N/A Exudate Type: Serous N/A N/A Exudate Color: amber N/A N/A Wound Margin: Flat and Intact N/A N/A Granulation Amount: None Present (0%) N/A N/A Granulation Quality: N/A N/A N/A Necrotic Amount: Large (67-100%) N/A N/A Necrotic Tissue: Eschar N/A N/A Exposed Structures: Fascia: No N/A N/A Fat: No Tendon: No Muscle: No Joint: No Bone: No Limited to Skin Breakdown Epithelialization: None N/A N/A Periwound Skin Texture: Edema: No N/A N/A Excoriation: No Induration: No Callus: No Crepitus: No Fluctuance: No  Friable: No Rash: No Scarring: No Periwound Skin Dry/Scaly: Yes N/A N/A Moisture: Maceration: No Moist: No Periwound Skin Color: Erythema: Yes N/A N/A Atrophie Blanche: No Cyanosis: No Ecchymosis: No Hemosiderin Staining: No Mottled: No Saccente, Loreli ([id number]) Pallor: No Rubor: No Erythema Location: Circumferential N/A N/A Temperature: N/A N/A N/A Tenderness on Yes N/A N/A Palpation: Wound Preparation: Ulcer Cleansing: N/A N/A Rinsed/Irrigated with Saline Topical Anesthetic Applied: Other: lidocaine 4% Treatment Notes Electronic Signature(s) Signed: 07/08/2015 5:21:15 PM By: Elliot GurneyWoody, RN, BSN, Kim RN, BSN Entered By: Elliot GurneyWoody, RN, BSN, Kim on 07/08/2015 10:36:19 [name], [name]  ([id number]) -------------------------------------------------------------------------------- Multi-Disciplinary Care Plan Details Patient Name: [name], [name] Date of Service: 07/08/2015 10:45 AM Medical Record Number: [id number] Patient Account Number: [id number] Date of Birth/Sex: 06/30/1921 (81 y.o. Female) Treating RN: Huel CoventryWoody, Kim Primary Care Physician: Dorothey BasemanBRONSTEIN, DAVID Other Clinician: Referring Physician: Dorothey BasemanBRONSTEIN, DAVID Treating Physician/Extender: Rudene ReBritto, Errol Weeks in Treatment: 21 Active Inactive Abuse / Safety / Falls / Self Care Management Nursing Diagnoses: Impaired physical mobility Potential for falls Goals: Patient will remain injury free Date Initiated: 02/11/2015 Goal Status: Active Patient/caregiver will verbalize understanding of skin care regimen Date Initiated: 02/11/2015 Goal Status: Active Patient/caregiver will verbalize/demonstrate measures taken to prevent injury and/or falls Date Initiated: 02/11/2015 Goal Status: Active Patient/caregiver will verbalize/demonstrate understanding of what to do in case of emergency Date Initiated: 02/11/2015 Goal Status: Active Interventions: Assess fall risk on admission and as needed Provide education on fall prevention Provide education on safe transfers Treatment Activities: Patient referred to home care : 07/08/2015 Notes: Nutrition Nursing Diagnoses: Imbalanced nutrition Potential for alteratiion in Nutrition/Potential for imbalanced nutrition Napier, Masiyah ([id number]) Goals: Patient/caregiver verbalizes understanding of need to maintain therapeutic glucose control per primary care physician Date Initiated: 02/11/2015 Goal Status: Active Patient/caregiver will maintain therapeutic glucose control Date Initiated: 02/11/2015 Goal Status: Active Interventions: Assess HgA1c results as ordered upon admission and as needed Provide education on elevated blood sugars and impact on wound healing Provide  education on nutrition Treatment Activities: Education provided on Nutrition : 04/22/2015 Obtain HgA1c : 07/08/2015 Notes: Wound/Skin Impairment Nursing Diagnoses: Impaired tissue integrity Knowledge deficit related to ulceration/compromised skin integrity Goals: Patient/caregiver will verbalize understanding of skin care regimen Date Initiated: 02/11/2015 Goal Status: Active Ulcer/skin breakdown will heal within 14 weeks Date Initiated: 02/11/2015 Goal Status: Active Interventions: Assess patient/caregiver ability to obtain necessary supplies Assess patient/caregiver ability to perform ulcer/skin care regimen upon admission and as needed Assess ulceration(s) every visit Provide education on ulcer and skin care Treatment Activities: Skin care regimen initiated : 07/08/2015 Topical wound management initiated : 07/08/2015 Notes: [name], [name] ([id number]) Electronic Signature(s) Signed: 07/08/2015 5:21:15 PM By: Elliot GurneyWoody, RN, BSN, Kim RN, BSN Entered By: Elliot GurneyWoody, RN, BSN, Kim on 07/08/2015 10:32:40 [name], [name] ([id number]) -------------------------------------------------------------------------------- Pain Assessment Details Patient Name: [name], [name] Date of Service: 07/08/2015 10:45 AM Medical Record Number: [id number] Patient Account Number: [id number] Date of Birth/Sex: 09/13/1921 (81 y.o. Female) Treating RN: Huel CoventryWoody, Kim Primary Care Physician: Dorothey BasemanBRONSTEIN, DAVID Other Clinician: Referring Physician: Dorothey BasemanBRONSTEIN, DAVID Treating Physician/Extender: Rudene ReBritto, Errol Weeks in Treatment: 21 Active Problems Location of Pain Severity and Description of Pain Patient Has Paino No Site Locations Pain Management and Medication Current Pain Management: Electronic Signature(s) Signed: 07/08/2015 5:21:15 PM By: Elliot GurneyWoody, RN, BSN, Kim RN, BSN Entered By: Elliot GurneyWoody, RN, BSN, Kim on 07/08/2015 10:18:40 [name], [name]  ([id number]) -------------------------------------------------------------------------------- Patient/Caregiver Education Details Patient Name: [name], [name] Date of Service: 07/08/2015 10:45 AM Medical Record Number: [id number] Patient Account Number: [id number] Date of Birth/Gender: 12/28/1920 (81 y.o. Female) Treating RN:  Huel Coventry Primary Care Physician: Terance Hart, DAVID Other Clinician: Referring Physician: Dorothey Baseman Treating Physician/Extender: Rudene Re in Treatment: 21 Education Assessment Education Provided To: Patient and Caregiver Education Topics Provided Wound/Skin Impairment: Handouts: Caring for Your Ulcer, Other: continue wound care aS PRESCRIBED Electronic Signature(s) Signed: 07/08/2015 5:21:15 PM By: Elliot Gurney, RN, BSN, Kim RN, BSN Entered By: Elliot Gurney, RN, BSN, Kim on 07/08/2015 10:56:05 [name] ([id number]) -------------------------------------------------------------------------------- Wound Assessment Details Patient Name: [name] Date of Service: 07/08/2015 10:45 AM Medical Record Number: [id number] Patient Account Number: [id number] Date of Birth/Sex: 1920/10/21 (81 y.o. 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 Wound Open Wounding Event: Gradually Appeared Status: Date Acquired: 11/04/2014 Comorbid Arrhythmia, Deep Vein Thrombosis, Weeks Of Treatment: 21 History: Peripheral Arterial Disease, Type II Clustered Wound: No Diabetes Photos Photo Uploaded By: Elliot Gurney, RN, BSN, Kim on 07/08/2015 17:52:21 Wound Measurements Length: (cm) 1 Width: (cm) 1.2 Depth: (cm) 0.1 Area: (cm) 0.942 Volume: (cm) 0.094 % Reduction in Area: 82.9% % Reduction in Volume:  82.9% Epithelialization: Small (1-33%) Wound Description Full Thickness Without Exposed Classification: Support Structures Diabetic Severity Grade 1 (Wagner): Wound Margin: Distinct, outline attached Exudate Amount: Medium Exudate Type: Serosanguineous Exudate Color: red, brown Foul Odor After Cleansing: No Wound Bed Granulation Amount: Medium (34-66%) Exposed Structure Goga, [name] ([id number]) Granulation Quality: Pink, Pale Fascia Exposed: No Necrotic Amount: Medium (34-66%) Fat Layer Exposed: No Tendon Exposed: No Muscle Exposed: No Joint Exposed: No Bone Exposed: No Limited to Skin Breakdown Periwound Skin Texture Texture Color No Abnormalities Noted: No No Abnormalities Noted: No Callus: No Atrophie Blanche: No Crepitus: No Cyanosis: No Excoriation: No Ecchymosis: No Fluctuance: No Erythema: No Friable: No Hemosiderin Staining: No Induration: No Mottled: No Localized Edema: No Pallor: No Rash: No Rubor: No Scarring: No Moisture No Abnormalities Noted: No Dry / Scaly: Yes Maceration: No Moist: No Wound Preparation Ulcer Cleansing: Rinsed/Irrigated with Saline Topical Anesthetic Applied: Other: lidocaine 4%, Treatment Notes Wound #1 (Right Calcaneous) 1. Cleansed with: Clean wound with Normal Saline 2. Anesthetic Topical Lidocaine 4% cream to wound bed prior to debridement 4. Dressing Applied: Medihoney Gel 5. Secondary Dressing Applied ABD Pad Gauze and Kerlix/Conform Electronic Signature(s) Signed: 07/08/2015 5:21:15 PM By: Elliot Gurney, RN, BSN, Kim RN, BSN Entered By: Elliot Gurney, RN, BSN, Kim on 07/08/2015 10:27:22 [name] ([id number]) [name], [name] ([id number]) -------------------------------------------------------------------------------- Wound Assessment Details Patient Name: [name] Date of Service: 07/08/2015 10:45 AM Medical Record Number: [id number] Patient Account Number: [id number] Date of Birth/Sex: 10/19/1920 (81 y.o.  Female) Treating RN: Huel Coventry Primary Care Physician: Dorothey Baseman Other Clinician: Referring Physician: Dorothey Baseman Treating Physician/Extender: Rudene Re in Treatment: 21 Wound Status Wound Number: 6 Primary Arterial Insufficiency Ulcer Etiology: Wound Location: Right Lower Leg - Posterior Wound Open Wounding Event: Blister Status: Date Acquired: 06/12/2015 Comorbid Arrhythmia, Deep Vein Thrombosis, Weeks Of Treatment: 3 History: Peripheral Arterial Disease, Type II Clustered Wound: No Diabetes Photos Photo Uploaded By: Elliot Gurney, RN, BSN, Kim on 07/08/2015 17:52:53 Wound Measurements Length: (cm) 0.8 Width: (cm) 0.5 Depth: (cm) 0.2 Area: (cm) 0.314 Volume: (cm) 0.063 % Reduction in Area: 60% % Reduction in Volume: 20.3% Epithelialization: Small (1-33%) Wound Description Full Thickness Without Exposed Classification: Support Structures Diabetic Severity Grade 1 (Wagner): Wound Margin: Distinct, outline attached Exudate Amount: Small Exudate Type: Serosanguineous Exudate Color: red, brown Foul Odor After Cleansing: No  Wound Bed Granulation Amount: Small (1-33%) Exposed Structure Murcia, Ezri ([id number]) Granulation Quality: Pink, Pale Fascia Exposed: No Necrotic Amount: Large (67-100%) Fat Layer Exposed: No Necrotic Quality: Adherent Slough Tendon Exposed: No Muscle Exposed: No Joint Exposed: No Bone Exposed: No Limited to Skin Breakdown Periwound Skin Texture Texture Color No Abnormalities Noted: No No Abnormalities Noted: No Callus: No Atrophie Blanche: No Crepitus: No Cyanosis: No Excoriation: No Ecchymosis: No Fluctuance: No Erythema: No Friable: No Hemosiderin Staining: No Induration: No Mottled: No Localized Edema: No Pallor: No Rash: No Rubor: No Scarring: No Temperature / Pain Moisture Temperature: No Abnormality No Abnormalities Noted: No Tenderness on Palpation: Yes Dry / Scaly: No Maceration:  No Moist: No Wound Preparation Ulcer Cleansing: Rinsed/Irrigated with Saline Topical Anesthetic Applied: Other: lidocaine 4%, Treatment Notes Wound #6 (Right, Posterior Lower Leg) 1. Cleansed with: Clean wound with Normal Saline 2. Anesthetic Topical Lidocaine 4% cream to wound bed prior to debridement 4. Dressing Applied: Medihoney Gel 5. Secondary Dressing Applied ABD Pad Gauze and Kerlix/Conform Electronic Signature(s) Signed: 07/08/2015 5:21:15 PM By: Elliot Gurney, RN, BSN, Kim RN, BSN Entered By: Elliot Gurney, RN, BSN, Kim on 07/08/2015 10:27:56 [name] ([id number]) [name], [name] ([id number]) -------------------------------------------------------------------------------- Wound Assessment Details Patient Name: [name] Date of Service: 07/08/2015 10:45 AM Medical Record Number: [id number] Patient Account Number: [id number] Date of Birth/Sex: [birth year] (81 y.o. Female) Treating RN: Huel Coventry Primary Care Physician: Dorothey Baseman Other Clinician: Referring Physician: Terance Hart, DAVID Treating Physician/Extender: Rudene Re in Treatment: 21 Wound Status Wound Number: 7 Primary Diabetic Wound/Ulcer of the Lower Etiology: Extremity Wound Location: Right Lower Leg - Anterior Wound Open Wounding Event: Gradually Appeared Status: Date Acquired: 06/18/2015 Comorbid Arrhythmia, Deep Vein Thrombosis, Weeks Of Treatment: 2 History: Peripheral Arterial Disease, Type II Clustered Wound: No Diabetes Photos Photo Uploaded By: Elliot Gurney, RN, BSN, Kim on 07/08/2015 17:52:53 Wound Measurements Length: (cm) 0.4 Width: (cm) 0.5 Depth: (cm) 0.1 Area: (cm) 0.157 Volume: (cm) 0.016 % Reduction in Area: 33.5% % Reduction in Volume: 66% Epithelialization: None Wound Description Classification: Grade 1 Wound Margin: Flat and Intact Exudate Amount: Small Exudate Type: Serous Exudate Color: amber Wound Bed Granulation Amount: None Present (0%) Exposed  Structure Necrotic Amount: Large (67-100%) Fascia Exposed: No Necrotic Quality: Adherent Slough Fat Layer Exposed: No Tendon Exposed: No Fedie, Genieve ([id number]) Muscle Exposed: No Joint Exposed: No Bone Exposed: No Limited to Skin Breakdown Periwound Skin Texture Texture Color No Abnormalities Noted: No No Abnormalities Noted: No Callus: No Atrophie Blanche: No Crepitus: No Cyanosis: No Excoriation: No Ecchymosis: No Fluctuance: No Erythema: Yes Friable: No Erythema Location: Circumferential Induration: No Hemosiderin Staining: No Localized Edema: Yes Mottled: No Rash: No Pallor: No Scarring: No Rubor: No Moisture No Abnormalities Noted: No Dry / Scaly: No Maceration: No Moist: Yes Wound Preparation Ulcer Cleansing: Rinsed/Irrigated with Saline Topical Anesthetic Applied: Other: liodocaine 4%, Treatment Notes Wound #7 (Right, Anterior Lower Leg) 1. Cleansed with: Clean wound with Normal Saline 2. Anesthetic Topical Lidocaine 4% cream to wound bed prior to debridement 4. Dressing Applied: Medihoney Gel 5. Secondary Dressing Applied ABD Pad Gauze and Kerlix/Conform Electronic Signature(s) Signed: 07/08/2015 5:21:15 PM By: Elliot Gurney, RN, BSN, Kim RN, BSN Entered By: Elliot Gurney, RN, BSN, Kim on 07/08/2015 10:28:17 [name] ([id number]) -------------------------------------------------------------------------------- Wound Assessment Details Patient Name: [name] Date of Service: 07/08/2015 10:45 AM Medical Record Number: [id number] Patient Account Number: [id number] Date of Birth/Sex: 1921/08/05 (81 y.o. Female) Treating RN: Huel Coventry Primary Care Physician: Dorothey Baseman Other Clinician: Referring Physician: Dorothey Baseman Treating Physician/Extender: Meyer Russel,  Errol Weeks in Treatment: 21 Wound Status Wound Number: 8 Primary Diabetic Wound/Ulcer of the Lower Etiology: Extremity Wound Location: Right Toe Fourth Wound Open Wounding Event:  Gradually Appeared Status: Date Acquired: 06/09/2015 Comorbid Arrhythmia, Deep Vein Thrombosis, Weeks Of Treatment: 2 History: Peripheral Arterial Disease, Type II Clustered Wound: No Diabetes Photos Photo Uploaded By: Elliot Gurney, RN, BSN, Kim on 07/08/2015 17:52:54 Wound Measurements Length: (cm) 0.5 Width: (cm) 0.5 Depth: (cm) 0.1 Area: (cm) 0.196 Volume: (cm) 0.02 % Reduction in Area: -176.1% % Reduction in Volume: -185.7% Epithelialization: None Wound Description Classification: Unable to visualize wound bed Foul Odor Aft Wound Margin: Flat and Intact Exudate Amount: Small Exudate Type: Serous Exudate Color: amber er Cleansing: No Wound Bed Granulation Amount: None Present (0%) Exposed Structure Necrotic Amount: Large (67-100%) Fascia Exposed: No Necrotic Quality: Eschar Fat Layer Exposed: No Tendon Exposed: No Butrum, Darinda ([id number]) Muscle Exposed: No Joint Exposed: No Bone Exposed: No Limited to Skin Breakdown Periwound Skin Texture Texture Color No Abnormalities Noted: No No Abnormalities Noted: No Callus: No Atrophie Blanche: No Crepitus: No Cyanosis: No Excoriation: No Ecchymosis: No Fluctuance: No Erythema: Yes Friable: No Erythema Location: Circumferential Induration: No Hemosiderin Staining: No Localized Edema: No Mottled: No Rash: No Pallor: No Scarring: No Rubor: No Moisture Temperature / Pain No Abnormalities Noted: No Tenderness on Palpation: Yes Dry / Scaly: Yes Maceration: No Moist: No Wound Preparation Ulcer Cleansing: Rinsed/Irrigated with Saline Topical Anesthetic Applied: Other: lidocaine 4%, Treatment Notes Wound #8 (Right Toe Fourth) 1. Cleansed with: Clean wound with Normal Saline 2. Anesthetic Topical Lidocaine 4% cream to wound bed prior to debridement 4. Dressing Applied: Medihoney Gel 5. Secondary Dressing Applied ABD Pad Gauze and Kerlix/Conform Electronic Signature(s) Signed: 07/08/2015 5:21:15 PM By:  Elliot Gurney, RN, BSN, Kim RN, BSN Entered By: Elliot Gurney, RN, BSN, Kim on 07/08/2015 10:28:48 [name] ([id number]) -------------------------------------------------------------------------------- Vitals Details Patient Name: [name] Date of Service: 07/08/2015 10:45 AM Medical Record Number: [id number] Patient Account Number: [id number] Date of Birth/Sex: 12-01-1920 (81 y.o. Female) Treating RN: Huel Coventry Primary Care Physician: Dorothey Baseman Other Clinician: Referring Physician: Dorothey Baseman Treating Physician/Extender: Rudene Re in Treatment: 21 Vital Signs Time Taken: 10:18 Temperature (F): 97.6 Height (in): 62 Pulse (bpm): 61 Weight (lbs): 155 Respiratory Rate (breaths/min): 18 Body Mass Index (BMI): 28.3 Blood Pressure (mmHg): 126/51 Reference Range: 80 - 120 mg / dl Electronic Signature(s) Signed: 07/08/2015 5:21:15 PM By: Elliot Gurney, RN, BSN, Kim RN, BSN Entered By: Elliot Gurney, RN, BSN, Kim on 07/08/2015 10:18:59

## 2015-07-09 NOTE — Progress Notes (Signed)
ALDONIA, [name] ([id number]) Visit Report for 07/08/2015 Chief Complaint Document Details Patient Name: [name], [name] 07/08/2015 10:45 Date of Service: AM Medical Record [id number] Number: Patient Account Number: [id number] February 28, 1921 (81 y.o. Treating RN: Huel Coventry Date of Birth/Sex: Female) Other Clinician: Primary Care Physician: Terance Hart, DAVID Treating Evlyn Kanner Referring Physician: Dorothey Baseman Physician/Extender: Tania Ade in Treatment: 21 Information Obtained from: Patient Chief Complaint Patient presents to the wound care center for a consult due non healing wound. 81 year old patient with comes with a history of a ulcerated area to the right third toe and the right heel which she's had for about 2 months. Electronic Signature(s) Signed: 07/08/2015 11:21:51 AM By: Evlyn Kanner MD, FACS Entered By: Evlyn Kanner on 07/08/2015 11:21:51 [name] [name] ([id number]) -------------------------------------------------------------------------------- HPI Details Patient Name: [name], [name] 07/08/2015 10:45 Date of Service: AM Medical Record [id number] Number: Patient Account Number: [id number] 1921/03/24 (81 y.o. Treating RN: Huel Coventry Date of Birth/Sex: Female) Other Clinician: Primary Care Physician: Terance Hart, DAVID Treating Evlyn Kanner Referring Physician: Dorothey Baseman Physician/Extender: Tania Ade in Treatment: 21 History of Present Illness Location: right medial heel and right third toe Quality: Patient reports experiencing a dull pain to affected area(s). Severity: Patient states wound are getting worse. Duration: Patient has had the wound for > 3 months prior to seeking treatment at the wound center Timing: Pain in wound is Intermittent (comes and goes Context: The wound appeared gradually over time Modifying Factors: Consults to this date include:vascular surgeon and several recent surgeries. Associated Signs and Symptoms: Patient reports having foul  odor and drainage from the left below-knee amputation site. HPI Description: A pleasant 81 year old patient who is known to have diabetes mellitus for several years has recently gone through a series of operations with the vascular surgeon Dr. Gilda Crease. In January and February she had several surgeries on the left lower extremity with an attempt to have limb salvage for gangrenous changes of her forefoot. 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 On 01/07/2015 she also had a right lower extremity distal runoff with a angioplasty of the right anterior tibial artery to maximize her blood flow to the foot. She recently had her staples removed at Dr. Marijean Heath office on 01/31/2015 and at that time a right lower extremity duplex was done which showed patent vessels and a patent stent with distal occluded posterior tibial artery. Though the duplex was noncritical the recommendations from the PA at the vascular surgery office was that of a angiogram to be done but the patient said she would rather try some bone care before. The patient has received doxycycline and Cipro in the recent past and she takes oral medications for her diabetes. Other than that I reviewed her list of all her medications. No recent hemoglobin A1c has been done and no recent x-rays of the right foot have been done. 02/18/2015 -- x-ray of the right foot was done on 02/11/2015 and it shows no evidence of acute osteomyelitis of the third toe or of the calcaneus. She had gone to the vascular surgery office and they had noted that there is dehiscence of the left part of the amputation site of the below-knee wound and they have asked [name] to kindly take over the care of this. There've been doing dressings for the right heel and right third toe. 02/25/2015 -- we have received notes from the vascular group who saw her last on 02/13/2015 and she was seen by the PA  Ms.  Cleda Daub. She had recommended that the patient continue to follow with [name] for wound care including management of the left BKA stump, which has had dehiscence of the lateral part. They will consider repeating a arterial duplex or angiogram if the right lower extremity does not heal within a reasonable period of time. 03/18/2015 -- he saw the vascular surgeon Dr. Gilda Crease and he has set her up for an angioplasty sometime LEBLOND, [place] ([id number]) in the middle of July. she is doing well otherwise. 04/10/2015 -- the patient had a procedure done on 04/08/2015 and this was a right angioplasty of the dorsalis pedis, anterior tibial artery, first superficial femoral artery in its midportion. There was successful intervention with recanalization and in-line flow to the right foot. 04/22/2015 -- the patient was looking rather pale today and the daughter confirms that her hemoglobin was down to 6.9. she is being monitored by her PCP. Her Lasix dose was increased and her edema has gone down significantly. 04/29/2015 -- she had vascular studies done and the ABI on the right is 1.3 and the toe pressures were within normal limits. Her hemoglobin is still around 7 and she refuses to take any blood transfusions for religious reasons. Her potassium was normal. 06/10/2015 -- she has a new blister on her right lateral and posterior part of her leg just in the region where the Kerlix bandages were applied and this may be due to an abrasion. 06/24/2015 -- On her right lower extremity she has developed several more blisters which look like small pustules and the drain informed shallow ulcerations. 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 07/01/2015 -- she has an appointment with the PCP this Friday and her vascular surgeon on Monday. Other than that she is on doxycycline and has finished 1 week of treatment. The pustules she had all over have now resolved. 07/08/2015 -- she saw the vascular surgeon who  did studies in the office and found that there is poor circulation in the distal lower right leg and has set her up for an angiogram and possible stenting next Tuesday. Electronic Signature(s) Signed: 07/08/2015 11:22:46 AM By: Evlyn Kanner MD, FACS Entered By: Evlyn Kanner on 07/08/2015 11:22:46 [name] [name] ([id number]) -------------------------------------------------------------------------------- Physical Exam Details Patient Name: [name], [name] 07/08/2015 10:45 Date of Service: AM Medical Record [id number] Number: Patient Account Number: [id number] 1921-04-11 (81 y.o. Treating RN: Huel Coventry Date of Birth/Sex: Female) Other Clinician: Primary Care Physician: Terance Hart, DAVID Treating Evlyn Kanner Referring Physician: Dorothey Baseman Physician/Extender: Weeks in Treatment: 21 Constitutional . Pulse regular. Respirations normal and unlabored. Afebrile. . Eyes Nonicteric. Reactive to light. Ears, Nose, Mouth, and Throat Lips, teeth, and gums WNL.Marland Kitchen Moist mucosa without lesions . Neck supple and nontender. No palpable supraclavicular or cervical adenopathy. Normal sized without goiter. Respiratory WNL. No retractions.. Cardiovascular Pedal Pulses WNL. No clubbing, cyanosis or edema. Lymphatic No adneopathy. No adenopathy. No adenopathy. Musculoskeletal Adexa without tenderness or enlargement.. Digits and nails w/o clubbing, cyanosis, infection, petechiae, ischemia, or inflammatory conditions.. Integumentary (Hair, Skin) No suspicious lesions. No crepitus or fluctuance. No peri-wound warmth or erythema. No masses.Marland Kitchen Psychiatric Judgement and insight Intact.. No evidence of depression, anxiety, or agitation.. Notes The right lower extremity has gotten rather dry with eschars covering most of the wounds. Due to her poor vascularity we will not debride any further today. The pustules have completely resolved. Electronic Signature(s) Signed: 07/08/2015 11:23:32 AM By:  Evlyn Kanner MD, FACS Entered By: Evlyn Kanner on 07/08/2015 11:23:32 [name] [name] ([id number]) -------------------------------------------------------------------------------- Physician Orders Details  Patient Name: KYNA, BLAHNIK 07/08/2015 10:45 Date of Service: AM Medical Record [id number] Number: Patient Account Number: [id number] 29-Dec-1920 (81 y.o. Treating RN: Huel Coventry Date of Birth/Sex: Female) Other Clinician: Primary Care Physician: Terance Hart, DAVID Treating Evlyn Kanner Referring Physician: Dorothey Baseman Physician/Extender: Tania Ade in Treatment: 21 Verbal / Phone Orders: Yes Clinician: Huel Coventry Read Back and Verified: Yes Diagnosis Coding Wound Cleansing Wound #1 Right Calcaneous o Cleanse wound with mild soap and water o May Shower, gently pat wound dry prior to applying new dressing. o May shower with protection. Wound #6 Right,Posterior Lower Leg o Cleanse wound with mild soap and water o May Shower, gently pat wound dry prior to applying new dressing. o May shower with protection. Wound #7 Right,Anterior Lower Leg o Cleanse wound with mild soap and water o May Shower, gently pat wound dry prior to applying new dressing. o May shower with protection. Wound #8 Right Toe Fourth o Cleanse wound with mild soap and water o May Shower, gently pat wound dry prior to applying new dressing. o May shower with protection. Anesthetic Wound #1 Right Calcaneous o Topical Lidocaine 4% cream applied to wound bed prior to debridement Wound #6 Right,Posterior Lower Leg o Topical Lidocaine 4% cream applied to wound bed prior to debridement Wound #7 Right,Anterior Lower Leg o Topical Lidocaine 4% cream applied to wound bed prior to debridement Wound #8 Right Toe Fourth o Topical Lidocaine 4% cream applied to wound bed prior to debridement Primary Wound Dressing Tarter, Cassandre ([id number]) Wound #1 Right Calcaneous o Medihoney  gel Wound #6 Right,Posterior Lower Leg o Medihoney gel Wound #7 Right,Anterior Lower Leg o Medihoney gel Wound #8 Right Toe Fourth o Medihoney gel Secondary Dressing Wound #1 Right Calcaneous o ABD pad o Gauze and Kerlix/Conform Wound #6 Right,Posterior Lower Leg o ABD pad o Gauze and Kerlix/Conform Wound #7 Right,Anterior Lower Leg o ABD pad o Gauze and Kerlix/Conform Wound #8 Right Toe Fourth o Gauze and Kerlix/Conform Dressing Change Frequency Wound #1 Right Calcaneous o Change dressing every other day. Wound #6 Right,Posterior Lower Leg o Change dressing every other day. Wound #7 Right,Anterior Lower Leg o Change dressing every other day. Wound #8 Right Toe Fourth o Change dressing every other day. Follow-up Appointments Wound #1 Right Calcaneous o Return Appointment in 2 weeks. Wound #6 Right,Posterior Lower Leg o Return Appointment in 2 weeks. Gift, [place] ([id number]) Wound #7 Right,Anterior Lower Leg o Return Appointment in 2 weeks. Wound #8 Right Toe Fourth o Return Appointment in 2 weeks. Home Health Wound #1 Right Calcaneous o Continue Home Health Visits - [facility] o Home Health Nurse may visit PRN to address patientos wound care needs. o FACE TO FACE ENCOUNTER: MEDICARE and MEDICAID PATIENTS: I certify that this patient is under my care and that I had a face-to-face encounter that meets the physician face-to-face encounter requirements with this patient on this date. The encounter with the patient was in whole or in part for the following MEDICAL CONDITION: (primary reason for Home Healthcare) MEDICAL NECESSITY: I certify, that based on my findings, NURSING services are a medically necessary home health service. HOME BOUND STATUS: I certify that my clinical findings support that this patient is homebound (i.e., Due to illness or injury, pt requires aid of supportive devices such as crutches, cane, wheelchairs,  walkers, the use of special transportation or the assistance of another person to leave their place of residence. There is a normal inability to leave the home and doing so requires considerable and taxing  effort. Other absences are for medical reasons / religious services and are infrequent or of short duration when for other reasons). o If current dressing causes regression in wound condition, may D/C ordered dressing product/s and apply Normal Saline Moist Dressing daily until next Wound Healing Center / Other MD appointment. Notify Wound Healing Center of regression in wound condition at [phone]. o Please direct any NON-WOUND related issues/requests for orders to patient's Primary Care Physician Wound #6 Right,Posterior Lower Leg o Continue Home Health Visits - [facility] o Home Health Nurse may visit PRN to address patientos wound care needs. o FACE TO FACE ENCOUNTER: MEDICARE and MEDICAID PATIENTS: I certify that this patient is under my care and that I had a face-to-face encounter that meets the physician face-to-face encounter requirements with this patient on this date. The encounter with the patient was in whole or in part for the following MEDICAL CONDITION: (primary reason for Home Healthcare) MEDICAL NECESSITY: I certify, that based on my findings, NURSING services are a medically necessary home health service. HOME BOUND STATUS: I certify that my clinical findings support that this patient is homebound (i.e., Due to illness or injury, pt requires aid of supportive devices such as crutches, cane, wheelchairs, walkers, the use of special transportation or the assistance of another person to leave their place of residence. There is a normal inability to leave the home and doing so requires considerable and taxing effort. Other absences are for medical reasons / religious services and are infrequent or of short duration when for other reasons). o If current dressing  causes regression in wound condition, may D/C ordered dressing product/s and apply Normal Saline Moist Dressing daily until next Wound Healing Center / Other MD appointment. Notify Wound Healing Center of regression in wound condition at [phone]. o Please direct any NON-WOUND related issues/requests for orders to patient's Primary Care Physician LILYMAE, SWIECH ([id number]) Wound #7 Right,Anterior Lower Leg o Continue Home Health Visits - [facility] o Home Health Nurse may visit PRN to address patientos wound care needs. o FACE TO FACE ENCOUNTER: MEDICARE and MEDICAID PATIENTS: I certify that this patient is under my care and that I had a face-to-face encounter that meets the physician face-to-face encounter requirements with this patient on this date. The encounter with the patient was in whole or in part for the following MEDICAL CONDITION: (primary reason for Home Healthcare) MEDICAL NECESSITY: I certify, that based on my findings, NURSING services are a medically necessary home health service. HOME BOUND STATUS: I certify that my clinical findings support that this patient is homebound (i.e., Due to illness or injury, pt requires aid of supportive devices such as crutches, cane, wheelchairs, walkers, the use of special transportation or the assistance of another person to leave their place of residence. There is a normal inability to leave the home and doing so requires considerable and taxing effort. Other absences are for medical reasons / religious services and are infrequent or of short duration when for other reasons). o If current dressing causes regression in wound condition, may D/C ordered dressing product/s and apply Normal Saline Moist Dressing daily until next Wound Healing Center / Other MD appointment. Notify Wound Healing Center of regression in wound condition at [phone]. o Please direct any NON-WOUND related issues/requests for orders to patient's  Primary Care Physician Wound #8 Right Toe Fourth o Continue Home Health Visits - [facility] o Home Health Nurse may visit PRN to address patientos wound care needs. o FACE TO FACE ENCOUNTER: MEDICARE  and MEDICAID PATIENTS: I certify that this patient is under my care and that I had a face-to-face encounter that meets the physician face-to-face encounter requirements with this patient on this date. The encounter with the patient was in whole or in part for the following MEDICAL CONDITION: (primary reason for Home Healthcare) MEDICAL NECESSITY: I certify, that based on my findings, NURSING services are a medically necessary home health service. HOME BOUND STATUS: I certify that my clinical findings support that this patient is homebound (i.e., Due to illness or injury, pt requires aid of supportive devices such as crutches, cane, wheelchairs, walkers, the use of special transportation or the assistance of another person to leave their place of residence. There is a normal inability to leave the home and doing so requires considerable and taxing effort. Other absences are for medical reasons / religious services and are infrequent or of short duration when for other reasons). o If current dressing causes regression in wound condition, may D/C ordered dressing product/s and apply Normal Saline Moist Dressing daily until next Wound Healing Center / Other MD appointment. Notify Wound Healing Center of regression in wound condition at [phone]. o Please direct any NON-WOUND related issues/requests for orders to patient's Primary Care Physician Medications-please add to medication list. Wound #1 Right Calcaneous o P.O. Antibiotics - doxycycline Wound #6 Right,Posterior Lower Leg o P.O. Antibiotics - doxycycline Flamm, [place] ([id number]) Wound #7 Right,Anterior Lower Leg o P.O. Antibiotics - doxycycline Wound #8 Right Toe Fourth o P.O. Antibiotics - doxycycline Electronic  Signature(s) Signed: 07/08/2015 4:43:21 PM By: Evlyn Kanner MD, FACS Signed: 07/08/2015 5:21:15 PM By: Elliot Gurney RN, BSN, Kim RN, BSN Entered By: Elliot Gurney, RN, BSN, Kim on 07/08/2015 10:42:15 [name] [name] ([id number]) -------------------------------------------------------------------------------- Problem List Details Patient Name: [name], [name] 07/08/2015 10:45 Date of Service: AM Medical Record [id number] Number: Patient Account Number: [id number] 10/09/1920 (81 y.o. 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 02/11/2015 Yes fat layer exposed L97.812 Non-pressure chronic ulcer of other part of right lower leg 02/11/2015 Yes with fat layer exposed T81.31XA Disruption of external operation (surgical) wound, not 02/18/2015 Yes elsewhere classified, initial encounter Inactive Problems Resolved Problems Electronic Signature(s) Signed: 07/08/2015 11:21:44 AM By: Evlyn Kanner MD, FACS Entered By: Evlyn Kanner on 07/08/2015 11:21:44 [name] [name] ([id number]) -------------------------------------------------------------------------------- Progress Note Details Patient Name: [name], [name] 07/08/2015 10:45 Date of Service: AM Medical Record [id number] Number: Patient Account Number: [id number] 17-May-1921 (81 y.o. Treating RN: Huel Coventry Date of Birth/Sex: Female) Other Clinician: Primary Care Physician: Terance Hart, DAVID Treating Evlyn Kanner Referring Physician: Dorothey Baseman Physician/Extender: Tania Ade in Treatment: 21 Subjective Chief Complaint Information obtained from Patient Patient presents to the wound care center for a consult due non healing wound. 81 year old patient with comes with a history of a ulcerated area to the right third toe and the right heel which she's had for about 2 months. History of Present Illness (HPI) The following HPI elements were documented for the patient's wound: Location: right medial heel and right third toe Quality: Patient reports experiencing a dull pain to affected area(s). Severity: Patient  states wound are getting worse. Duration: Patient has had the wound for > 3 months prior to seeking treatment at the wound center Timing: Pain in wound is Intermittent (comes and goes Context: The wound appeared gradually over time Modifying Factors: Consults to this date include:vascular surgeon and several recent surgeries. Associated Signs and Symptoms: Patient reports having foul odor and drainage from the left below-knee amputation site. A pleasant 81 year old patient who is known to have diabetes mellitus for several years has recently gone through a series of operations with the vascular surgeon Dr. Gilda Crease. In January and February she had several surgeries on the left lower extremity with an attempt to have limb salvage for gangrenous changes of her forefoot. 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 On 01/07/2015 she also had a right lower extremity distal runoff with a angioplasty of the right anterior tibial artery to maximize her blood flow to the foot. She recently had her staples removed at Dr. Marijean Heath office on 01/31/2015 and at that time a right lower extremity duplex was done which showed patent vessels and a patent stent with distal occluded posterior tibial artery. Though the duplex was  noncritical the recommendations from the PA at the vascular surgery office was that of a angiogram to be done but the patient said she would rather try some bone care before. The patient has received doxycycline and Cipro in the recent past and she takes oral medications for her diabetes. Other than that I reviewed her list of all her medications. No recent hemoglobin A1c has been done and no recent x-rays of the right foot have been done. 02/18/2015 -- x-ray of the right foot was done on 02/11/2015 and it shows no evidence of acute osteomyelitis of the third toe or of the calcaneus. She had gone to the vascular surgery office and they had noted that there is dehiscence of the left part of Nifong, Brandye ([id number]) the amputation site of the below-knee wound and they have asked [name] to kindly take over the care of this. There've been doing dressings for the right heel and right third toe. 02/25/2015 -- we have received notes from the vascular group who saw her last on 02/13/2015 and she was seen by the PA Ms. Cleda Daub. She had recommended that the patient continue to follow with [name] for wound care including management of the left BKA stump, which has had dehiscence of the lateral part. They will consider repeating a arterial duplex or angiogram if the right lower extremity does not heal within a reasonable period of time. 03/18/2015 -- he saw the vascular surgeon Dr. Gilda Crease and he has set her up for an angioplasty sometime in the middle of July. she is doing well otherwise. 04/10/2015 -- the patient had a procedure done on 04/08/2015 and this was a right angioplasty of the dorsalis pedis, anterior tibial artery, first superficial femoral artery in its midportion. There was successful intervention with recanalization and in-line flow to the right foot. 04/22/2015 -- the patient was looking rather pale today and the daughter confirms that her hemoglobin was down to 6.9. she is being  monitored by her PCP. Her Lasix dose was increased and her edema has gone down significantly. 04/29/2015 -- she had vascular studies done and the ABI on the right is 1.3 and the toe pressures were within normal limits. Her hemoglobin is still around 7 and she refuses to take any blood  transfusions for religious reasons. Her potassium was normal. 06/10/2015 -- she has a new blister on her right lateral and posterior part of her leg just in the region where the Kerlix bandages were applied and this may be due to an abrasion. 06/24/2015 -- On her right lower extremity she has developed several more blisters which look like small pustules and the drain informed shallow ulcerations. 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 07/01/2015 -- she has an appointment with the PCP this Friday and her vascular surgeon on Monday. Other than that she is on doxycycline and has finished 1 week of treatment. The pustules she had all over have now resolved. 07/08/2015 -- she saw the vascular surgeon who did studies in the office and found that there is poor circulation in the distal lower right leg and has set her up for an angiogram and possible stenting next Tuesday. Objective Constitutional Pulse regular. Respirations normal and unlabored. Afebrile. Vitals Time Taken: 10:18 AM, Height: 62 in, Weight: 155 lbs, BMI: 28.3, Temperature: 97.6 F, Pulse: 61 bpm, Respiratory Rate: 18 breaths/min, Blood Pressure: 126/51 mmHg. Eyes Matsushima, Lennix ([id number]) Nonicteric. Reactive to light. Ears, Nose, Mouth, and Throat Lips, teeth, and gums WNL.Marland Kitchen Moist mucosa without lesions . Neck supple and nontender. No palpable supraclavicular or cervical adenopathy. Normal sized without goiter. Respiratory WNL. No retractions.. Cardiovascular Pedal Pulses WNL. No clubbing, cyanosis or edema. Lymphatic No adneopathy. No adenopathy. No adenopathy. Musculoskeletal Adexa without tenderness or enlargement.. Digits and nails w/o  clubbing, cyanosis, infection, petechiae, ischemia, or inflammatory conditions.Marland Kitchen Psychiatric Judgement and insight Intact.. No evidence of depression, anxiety, or agitation.. General Notes: The right lower extremity has gotten rather dry with eschars covering most of the wounds. Due to her poor vascularity we will not debride any further today. The pustules have completely resolved. Integumentary (Hair, Skin) No suspicious lesions. No crepitus or fluctuance. No peri-wound warmth or erythema. No masses.. Wound #1 status is Open. Original cause of wound was Gradually Appeared. The wound is located on the Right Calcaneous. The wound measures 1cm length x 1.2cm width x 0.1cm depth; 0.942cm^2 area and 0.094cm^3 volume. The wound is limited to skin breakdown. There is a medium amount of serosanguineous drainage noted. The wound margin is distinct with the outline attached to the wound base. There is medium (34-66%) pink, pale granulation within the wound bed. There is a medium (34-66%) amount of necrotic tissue within the wound bed. The periwound skin appearance exhibited: Dry/Scaly. The periwound skin appearance did not exhibit: Callus, Crepitus, Excoriation, Fluctuance, Friable, Induration, Localized Edema, Rash, Scarring, Maceration, Moist, Atrophie Blanche, Cyanosis, Ecchymosis, Hemosiderin Staining, Mottled, Pallor, Rubor, Erythema. Wound #6 status is Open. Original cause of wound was Blister. The wound is located on the Right,Posterior Lower Leg. The wound measures 0.8cm length x 0.5cm width x 0.2cm depth; 0.314cm^2 area and 0.063cm^3 volume. The wound is limited to skin breakdown. There is a small amount of serosanguineous drainage noted. The wound margin is distinct with the outline attached to the wound base. There is small (1-33%) pink, pale granulation within the wound bed. There is a large (67-100%) amount of necrotic tissue within the wound bed including Adherent Slough. The periwound  skin appearance did not exhibit: Callus, Crepitus, Excoriation, Fluctuance, Friable, Induration, Localized Edema, Rash, Scarring, Dry/Scaly, Maceration, Moist, Atrophie Blanche, Cyanosis, Ecchymosis, Hemosiderin Staining, Mottled, Pallor, Rubor, Erythema. Periwound temperature was noted as No Abnormality. The periwound has tenderness on palpation. Welsch, Malaisha ([id number]) Wound #7 status is Open. Original cause of wound was  Gradually Appeared. The wound is located on the Right,Anterior Lower Leg. The wound measures 0.4cm length x 0.5cm width x 0.1cm depth; 0.157cm^2 area and 0.016cm^3 volume. The wound is limited to skin breakdown. There is a small amount of serous drainage noted. The wound margin is flat and intact. There is no granulation within the wound bed. There is a large (67-100%) amount of necrotic tissue within the wound bed including Adherent Slough. The periwound skin appearance exhibited: Localized Edema, Moist, Erythema. The periwound skin appearance did not exhibit: Callus, Crepitus, Excoriation, Fluctuance, Friable, Induration, Rash, Scarring, Dry/Scaly, Maceration, Atrophie Blanche, Cyanosis, Ecchymosis, Hemosiderin Staining, Mottled, Pallor, Rubor. The surrounding wound skin color is noted with erythema which is circumferential. Wound #8 status is Open. Original cause of wound was Gradually Appeared. The wound is located on the Right Toe Fourth. The wound measures 0.5cm length x 0.5cm width x 0.1cm depth; 0.196cm^2 area and 0.02cm^3 volume. The wound is limited to skin breakdown. There is a small amount of serous drainage noted. The wound margin is flat and intact. There is no granulation within the wound bed. There is a large (67-100%) amount of necrotic tissue within the wound bed including Eschar. The periwound skin appearance exhibited: Dry/Scaly, Erythema. The periwound skin appearance did not exhibit: Callus, Crepitus, Excoriation, Fluctuance, Friable, Induration,  Localized Edema, Rash, Scarring, Maceration, Moist, Atrophie Blanche, Cyanosis, Ecchymosis, Hemosiderin Staining, Mottled, Pallor, Rubor. The surrounding wound skin color is noted with erythema which is circumferential. The periwound has tenderness on palpation. 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 She is going to have a vascular procedure next Tuesday and we will see her the week after. Plan [place] Maries, Keirah ([id number]) Wound Cleansing: Wound #1 Right Calcaneous: Cleanse wound with mild soap and water May Shower, gently pat wound dry prior to applying new dressing. May shower with protection. Wound #6 Right,Posterior Lower Leg: Cleanse wound with mild soap and water May Shower, gently pat wound dry prior to applying new dressing. May shower with protection. Wound #7 Right,Anterior Lower Leg: Cleanse wound with mild soap and water May Shower, gently pat wound dry prior to applying new dressing. May shower with protection. Wound #8 Right Toe Fourth: Cleanse wound with mild soap and water May Shower, gently pat wound dry prior to applying new dressing. May shower with protection. Anesthetic: Wound #1 Right Calcaneous: Topical Lidocaine 4% cream applied to wound bed prior to debridement Wound #6 Right,Posterior Lower Leg: Topical Lidocaine 4% cream applied to wound bed prior to  debridement Wound #7 Right,Anterior Lower Leg: Topical Lidocaine 4% cream applied to wound bed prior to debridement Wound #8 Right Toe Fourth: Topical Lidocaine 4% cream applied to wound bed prior to debridement Primary Wound Dressing: Wound #1 Right Calcaneous: Medihoney gel Wound #6 Right,Posterior Lower Leg: Medihoney gel Wound #7 Right,Anterior Lower Leg: Medihoney gel Wound #8 Right Toe Fourth: Medihoney gel Secondary Dressing: Wound #1 Right Calcaneous: ABD pad Gauze and Kerlix/Conform Wound #6  Right,Posterior Lower Leg: ABD pad Gauze and Kerlix/Conform Wound #7 Right,Anterior Lower Leg: ABD pad Gauze and Kerlix/Conform Wound #8 Right Toe Fourth: Gauze and Kerlix/Conform Dressing Change Frequency: Wound #1 Right Calcaneous: Change dressing every other day. Voshell, Tomi ([id number]) Wound #6 Right,Posterior Lower Leg: Change dressing every other day. Wound #7 Right,Anterior Lower Leg: Change dressing every other day. Wound #8 Right Toe Fourth: Change dressing every other day. Follow-up Appointments: Wound #1 Right Calcaneous: Return Appointment in 2 weeks. Wound #6 Right,Posterior Lower Leg: Return Appointment in 2 weeks. Wound #7 Right,Anterior Lower Leg: Return Appointment in 2 weeks. Wound #8 Right Toe Fourth: Return Appointment in 2 weeks. Home Health: Wound #1 Right Calcaneous: Continue Home Health Visits - [facility] Health Nurse may visit PRN to address patient s wound care needs. FACE TO FACE ENCOUNTER: MEDICARE and MEDICAID PATIENTS: I certify that this patient is under my care and that I had a face-to-face encounter that meets the physician face-to-face encounter requirements with this patient on this date. The encounter with the patient was in whole or in part for the following MEDICAL CONDITION: (primary reason for Home Healthcare) MEDICAL NECESSITY: I certify, that based on my findings, NURSING services are a medically necessary home health  service. HOME BOUND STATUS: I certify that my clinical findings support that this patient is homebound (i.e., Due to illness or injury, pt requires aid of supportive devices such as crutches, cane, wheelchairs, walkers, the use of special transportation or the assistance of another person to leave their place of residence. There is a normal inability to leave the home and doing so requires considerable and taxing effort. Other absences are for medical reasons / religious services and are infrequent or of short duration when for other reasons). If current dressing causes regression in wound condition, may D/C ordered dressing product/s and apply Normal Saline Moist Dressing daily until next Wound Healing Center / Other MD appointment. Notify Wound Healing Center of regression in wound condition at [phone]. Please direct any NON-WOUND related issues/requests for orders to patient's Primary Care Physician Wound #6 Right,Posterior Lower Leg: Continue Home Health Visits - [facility] Health Nurse may visit PRN to address patient s wound care needs. FACE TO FACE ENCOUNTER: MEDICARE and MEDICAID PATIENTS: I certify that this patient is under my care and that I had a face-to-face encounter that meets the physician face-to-face encounter requirements with this patient on this date. The encounter with the patient was in whole or in part for the following MEDICAL CONDITION: (primary reason for Home Healthcare) MEDICAL NECESSITY: I certify, that based on my findings, NURSING services are a medically necessary home health service. HOME BOUND STATUS: I certify that my clinical findings support that this patient is homebound (i.e., Due to illness or injury, pt requires aid of supportive devices such as crutches, cane, wheelchairs, walkers, the use of special transportation or the assistance of another person to leave their place of residence. There is a normal inability to leave the home and doing so  requires considerable and taxing effort. Other absences are for medical reasons / religious services and are infrequent or of short duration when for other reasons). If current dressing causes regression in wound condition, may D/C ordered dressing product/s and apply Normal Saline Moist Dressing daily until next Wound Healing Center / Other MD appointment. Notify Wound Healing Center of regression in wound condition at [phone]. Please direct any NON-WOUND related issues/requests for orders to patient's Primary Care Physician Wound #7 Right,Anterior Lower Leg:  [name], [name] ([id number]) Continue Home Health Visits - [facility] Health Nurse may visit PRN to address patient s wound care needs. FACE TO FACE ENCOUNTER: MEDICARE and MEDICAID PATIENTS: I certify that this patient is under my care and that I had a face-to-face encounter that meets the physician face-to-face encounter requirements with this patient on this date. The encounter with the patient was in whole or in part for the following MEDICAL CONDITION: (primary reason for Home Healthcare) MEDICAL NECESSITY: I certify, that based on my findings, NURSING services are a medically necessary home health service. HOME BOUND STATUS: I certify that my clinical findings support that this patient is homebound (i.e., Due to illness or injury, pt requires aid of supportive devices such as crutches, cane, wheelchairs, walkers, the use of special transportation or the assistance of another person to leave their place of residence. There is a normal inability to leave the home and doing so requires considerable and taxing effort. Other absences are for medical reasons / religious services and are infrequent or of short duration when for other reasons). If current dressing causes regression in wound condition, may D/C ordered dressing product/s and apply Normal Saline Moist Dressing daily until next Wound Healing Center / Other MD appointment.  Notify Wound Healing Center of regression in wound condition at [phone]. Please direct any NON-WOUND related issues/requests for orders to patient's Primary Care Physician Wound #8 Right Toe Fourth: Continue Home Health Visits - [facility] Health Nurse may visit PRN to address patient s wound care needs. FACE TO FACE ENCOUNTER: MEDICARE and MEDICAID PATIENTS: I certify that this patient is under my care and that I had a face-to-face encounter that meets the physician face-to-face encounter requirements with this patient on this date. The encounter with the patient was in whole or in part for the following MEDICAL CONDITION: (primary reason for Home Healthcare) MEDICAL NECESSITY: I certify, that based on my findings, NURSING services are a medically necessary home health service. HOME BOUND STATUS: I certify that my clinical findings support that this patient is homebound (i.e., Due to illness or injury, pt requires aid of supportive devices such as crutches, cane, wheelchairs, walkers, the use of special transportation or the assistance of another person to leave their place of residence. There is a normal inability to leave the home and doing so requires considerable and taxing effort. Other absences are for medical reasons / religious services and are infrequent or of short duration when for other reasons). If current dressing causes regression in wound condition, may D/C ordered dressing product/s and apply Normal Saline Moist Dressing daily until next Wound Healing Center / Other MD appointment. Notify Wound Healing Center of regression in wound condition at [phone]. Please direct any NON-WOUND related issues/requests for orders to patient's Primary Care Physician Medications-please add to medication list.: Wound #1 Right Calcaneous: P.O. Antibiotics - doxycycline Wound #6 Right,Posterior Lower Leg: P.O. Antibiotics - doxycycline Wound #7 Right,Anterior Lower Leg: P.O.  Antibiotics - doxycycline Wound #8 Right Toe Fourth: P.O. Antibiotics - doxycycline I'm going to recommend application of medihoney on a daily basis overall the wound basis so as to moisten and debride this. [name], [name] ([id number]) She is going to have a vascular procedure next Tuesday and we will see her the week after. Electronic Signature(s) Signed: 07/08/2015 11:24:38 AM By: Evlyn Kanner MD, FACS Entered By: Evlyn Kanner on 07/08/2015 11:24:37 [name] [name] ([id number]) -------------------------------------------------------------------------------- SuperBill Details Patient Name: [name] [name] Date of Service: 07/08/2015 Medical Record Number: [id number] Patient  Account Number: [id number] Date of Birth/Sex: 07/08/1921 (81 y.o. 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 L97.812 Non-pressure chronic ulcer of other part of right lower leg with fat layer exposed Disruption of external operation (surgical) wound, not elsewhere classified, initial T81.31XA encounter Facility Procedures CPT4 Code: [id number] Description: [id number] - WOUND CARE VISIT-LEV 5 EST PT Modifier: Quantity: 1 Physician Procedures CPT4: Description Modifier Quantity Code [id number] 99213 - WC PHYS LEVEL 3 - EST PT 1 ICD-10 Description Diagnosis E11.621 Type 2 diabetes mellitus with foot ulcer I70.235 Atherosclerosis of native arteries of right leg with ulceration of other part of  foot L97.412 Non-pressure chronic ulcer of right heel and  midfoot with fat layer exposed L97.812 Non-pressure chronic ulcer of other part of right lower leg with fat layer exposed Electronic Signature(s) Signed: 07/08/2015 11:24:59 AM By: Evlyn KannerBritto, Amaar Oshita MD, FACS Entered By: Evlyn KannerBritto, Tracyann Duffell on 07/08/2015 11:24:59

## 2015-07-15 ENCOUNTER — Ambulatory Visit
Admission: RE | Admit: 2015-07-15 | Discharge: 2015-07-15 | Disposition: A | Discharge status: Home / Self Care | Payer: Medicare Other | Source: Ambulatory Visit | Attending: Vascular Surgery | Admitting: Vascular Surgery

## 2015-07-15 ENCOUNTER — Encounter
Admission: RE | Disposition: A | Discharge status: Home / Self Care | Payer: Self-pay | Source: Ambulatory Visit | Attending: Vascular Surgery

## 2015-07-15 ENCOUNTER — Encounter: Payer: Self-pay | Admitting: *Deleted

## 2015-07-15 DIAGNOSIS — I890 Lymphedema, not elsewhere classified: Secondary | ICD-10-CM | POA: Insufficient documentation

## 2015-07-15 DIAGNOSIS — E119 Type 2 diabetes mellitus without complications: Secondary | ICD-10-CM | POA: Insufficient documentation

## 2015-07-15 DIAGNOSIS — I499 Cardiac arrhythmia, unspecified: Secondary | ICD-10-CM | POA: Diagnosis not present

## 2015-07-15 DIAGNOSIS — Z79899 Other long term (current) drug therapy: Secondary | ICD-10-CM | POA: Insufficient documentation

## 2015-07-15 DIAGNOSIS — E8770 Fluid overload, unspecified: Secondary | ICD-10-CM | POA: Insufficient documentation

## 2015-07-15 DIAGNOSIS — I10 Essential (primary) hypertension: Secondary | ICD-10-CM | POA: Insufficient documentation

## 2015-07-15 DIAGNOSIS — L97419 Non-pressure chronic ulcer of right heel and midfoot with unspecified severity: Secondary | ICD-10-CM | POA: Insufficient documentation

## 2015-07-15 DIAGNOSIS — Z86718 Personal history of other venous thrombosis and embolism: Secondary | ICD-10-CM | POA: Insufficient documentation

## 2015-07-15 DIAGNOSIS — I70234 Atherosclerosis of native arteries of right leg with ulceration of heel and midfoot: Secondary | ICD-10-CM | POA: Diagnosis present

## 2015-07-15 DIAGNOSIS — Z89612 Acquired absence of left leg above knee: Secondary | ICD-10-CM | POA: Diagnosis not present

## 2015-07-15 DIAGNOSIS — E785 Hyperlipidemia, unspecified: Secondary | ICD-10-CM | POA: Diagnosis not present

## 2015-07-15 DIAGNOSIS — Z87891 Personal history of nicotine dependence: Secondary | ICD-10-CM | POA: Insufficient documentation

## 2015-07-15 HISTORY — PX: PERIPHERAL VASCULAR CATHETERIZATION: SHX172C

## 2015-07-15 HISTORY — DX: Heart failure, unspecified: I50.9

## 2015-07-15 HISTORY — DX: Unspecified atrial fibrillation: I48.91

## 2015-07-15 HISTORY — DX: Gout, unspecified: M10.9

## 2015-07-15 LAB — BASIC METABOLIC PANEL
Anion gap: 6 (ref 5–15)
BUN: 44 mg/dL — AB (ref 6–20)
CALCIUM: 9.4 mg/dL (ref 8.9–10.3)
CO2: 30 mmol/L (ref 22–32)
Chloride: 106 mmol/L (ref 101–111)
Creatinine, Ser: 1 mg/dL (ref 0.44–1.00)
GFR calc Af Amer: 55 mL/min — ABNORMAL LOW (ref >60)
GFR, EST NON AFRICAN AMERICAN: 47 mL/min — AB (ref >60)
GLUCOSE: 99 mg/dL (ref 65–99)
Potassium: 4 mmol/L (ref 3.5–5.1)
Sodium: 142 mmol/L (ref 135–145)

## 2015-07-15 SURGERY — ABDOMINAL AORTOGRAM W/LOWER EXTREMITY
Laterality: Right | Wound class: Clean

## 2015-07-15 MED ORDER — LIDOCAINE HCL (PF) 1 % IJ SOLN
INTRAMUSCULAR | Status: AC
  Filled 2015-07-15: qty 20

## 2015-07-15 MED ORDER — FENTANYL CITRATE (PF) 100 MCG/2ML IJ SOLN
INTRAMUSCULAR | Status: DC | PRN
  Administered 2015-07-15: 25 ug via INTRAVENOUS
  Administered 2015-07-15: 50 ug via INTRAVENOUS

## 2015-07-15 MED ORDER — HEPARIN SODIUM (PORCINE) 1000 UNIT/ML IJ SOLN
INTRAMUSCULAR | Status: DC | PRN
  Administered 2015-07-15: 5000 [IU] via INTRAVENOUS

## 2015-07-15 MED ORDER — DOCUSATE SODIUM 100 MG PO CAPS: 100.0000 mg | ORAL_CAPSULE | Freq: Every day | ORAL | Status: DC

## 2015-07-15 MED ORDER — PANTOPRAZOLE SODIUM 40 MG PO TBEC: 40.0000 mg | DELAYED_RELEASE_TABLET | Freq: Every day | ORAL | Status: DC

## 2015-07-15 MED ORDER — LABETALOL HCL 5 MG/ML IV SOLN: 10.0000 mg | INTRAVENOUS | Status: DC | PRN

## 2015-07-15 MED ORDER — OXYCODONE HCL 5 MG PO TABS: 5.0000 mg | ORAL_TABLET | ORAL | Status: DC | PRN

## 2015-07-15 MED ORDER — HEPARIN SODIUM (PORCINE) 1000 UNIT/ML IJ SOLN
INTRAMUSCULAR | Status: AC
  Filled 2015-07-15: qty 1

## 2015-07-15 MED ORDER — CLINDAMYCIN PHOSPHATE 300 MG/50ML IV SOLN
INTRAVENOUS | Status: AC
  Filled 2015-07-15: qty 50

## 2015-07-15 MED ORDER — FENTANYL CITRATE (PF) 100 MCG/2ML IJ SOLN
INTRAMUSCULAR | Status: AC
  Filled 2015-07-15: qty 2

## 2015-07-15 MED ORDER — HEPARIN (PORCINE) IN NACL 2-0.9 UNIT/ML-% IJ SOLN
INTRAMUSCULAR | Status: AC
  Filled 2015-07-15: qty 1000

## 2015-07-15 MED ORDER — HYDROMORPHONE HCL 1 MG/ML IJ SOLN: 0.5000 mg | INTRAMUSCULAR | Status: DC | PRN

## 2015-07-15 MED ORDER — PHENOL 1.4 % MT LIQD: 1.0000 | OROMUCOSAL | Status: DC | PRN

## 2015-07-15 MED ORDER — ACETAMINOPHEN 325 MG PO TABS: 325.0000 mg | ORAL_TABLET | ORAL | Status: DC | PRN

## 2015-07-15 MED ORDER — POLYETHYLENE GLYCOL 3350 17 G PO PACK: 17.0000 g | PACK | Freq: Every day | ORAL | Status: DC | PRN

## 2015-07-15 MED ORDER — ONDANSETRON HCL 4 MG/2ML IJ SOLN: 4.0000 mg | Freq: Four times a day (QID) | INTRAMUSCULAR | Status: DC | PRN

## 2015-07-15 MED ORDER — SODIUM BICARBONATE BOLUS VIA INFUSION
INTRAVENOUS | Status: AC
  Administered 2015-07-15: 14:00:00 via INTRAVENOUS
  Filled 2015-07-15: qty 1

## 2015-07-15 MED ORDER — MIDAZOLAM HCL 5 MG/5ML IJ SOLN
INTRAMUSCULAR | Status: AC
  Filled 2015-07-15: qty 5

## 2015-07-15 MED ORDER — ALUM & MAG HYDROXIDE-SIMETH 200-200-20 MG/5ML PO SUSP: 15.0000 mL | ORAL | Status: DC | PRN

## 2015-07-15 MED ORDER — MIDAZOLAM HCL 2 MG/2ML IJ SOLN
INTRAMUSCULAR | Status: DC | PRN
  Administered 2015-07-15: 1 mg via INTRAVENOUS
  Administered 2015-07-15: 2 mg via INTRAVENOUS

## 2015-07-15 MED ORDER — CLINDAMYCIN PHOSPHATE 300 MG/50ML IV SOLN: 300.0000 mg | Freq: Once | INTRAVENOUS | Status: DC

## 2015-07-15 MED ORDER — ASPIRIN 81 MG PO CHEW
81.0000 mg | CHEWABLE_TABLET | Freq: Once | ORAL | Status: AC
  Administered 2015-07-15: 81 mg via ORAL

## 2015-07-15 MED ORDER — IOHEXOL 300 MG/ML  SOLN
INTRAMUSCULAR | Status: DC | PRN
  Administered 2015-07-15: 60 mL via INTRA_ARTERIAL

## 2015-07-15 MED ORDER — SODIUM BICARBONATE 8.4 % IV SOLN
INTRAVENOUS | Status: AC
  Administered 2015-07-15: 15:00:00 via INTRAVENOUS
  Filled 2015-07-15: qty 500

## 2015-07-15 MED ORDER — HYDRALAZINE HCL 20 MG/ML IJ SOLN: 5.0000 mg | INTRAMUSCULAR | Status: DC | PRN

## 2015-07-15 MED ORDER — ACETAMINOPHEN 325 MG RE SUPP: 325.0000 mg | RECTAL | Status: DC | PRN

## 2015-07-15 MED ORDER — INSULIN ASPART 100 UNIT/ML ~~LOC~~ SOLN: 0.0000 [IU] | SUBCUTANEOUS | Status: DC

## 2015-07-15 MED ORDER — SODIUM CHLORIDE 0.9 % IV SOLN
INTRAVENOUS | Status: DC
  Administered 2015-07-15: 12:00:00 via INTRAVENOUS

## 2015-07-15 MED ORDER — METOPROLOL TARTRATE 1 MG/ML IV SOLN: 2.0000 mg | INTRAVENOUS | Status: DC | PRN

## 2015-07-15 MED ORDER — GUAIFENESIN-DM 100-10 MG/5ML PO SYRP: 15.0000 mL | ORAL_SOLUTION | ORAL | Status: DC | PRN

## 2015-07-15 MED ORDER — ASPIRIN 81 MG PO CHEW
CHEWABLE_TABLET | ORAL | Status: AC
  Administered 2015-07-15: 81 mg via ORAL
  Filled 2015-07-15: qty 1

## 2015-07-15 SURGICAL SUPPLY — 25 items
BALLN ULTRVRSE 2X300X150 (BALLOONS) ×5
BALLN ULTRVRSE 3X80X150 (BALLOONS) ×5
BALLOON ULTRVRSE 2X300X150 (BALLOONS) ×3 IMPLANT
BALLOON ULTRVRSE 3X80X150 (BALLOONS) ×3 IMPLANT
CATH C2 65CM (CATHETERS) ×5 IMPLANT
CATH CXI SUPP ST 2.6FR 150CM (MICROCATHETER) ×5 IMPLANT
CATH ROYAL FLUSH PIG 5F 70CM (CATHETERS) ×5 IMPLANT
CATH VERT 100CM (CATHETERS) ×5 IMPLANT
DEVICE PRESTO INFLATION (MISCELLANEOUS) ×5 IMPLANT
DEVICE STARCLOSE SE CLOSURE (Vascular Products) ×5 IMPLANT
DEVICE TORQUE (MISCELLANEOUS) ×5 IMPLANT
DRAPE INCISE IOBAN 66X45 STRL (DRAPES) ×5 IMPLANT
DRAPE TABLE BACK 80X90 (DRAPES) ×5 IMPLANT
GLIDECATH ANGLED 4FR 120CM (CATHETERS) ×5 IMPLANT
GLIDEWIRE ANGLED SS 035X260CM (WIRE) ×5 IMPLANT
PACK ANGIOGRAPHY (CUSTOM PROCEDURE TRAY) ×5 IMPLANT
SET INTRO CAPELLA COAXIAL (SET/KITS/TRAYS/PACK) ×5 IMPLANT
SHEATH BRITE TIP 5FRX11 (SHEATH) ×5 IMPLANT
SHEATH RAABE 6FR (SHEATH) ×5 IMPLANT
SYR MEDRAD MARK V 150ML (SYRINGE) ×5 IMPLANT
TUBING CONTRAST HIGH PRESS 72 (TUBING) ×5 IMPLANT
WIRE AMPLATZ SSTIFF .035X260CM (WIRE) ×5 IMPLANT
WIRE G V18X300CM (WIRE) ×5 IMPLANT
WIRE J 3MM .035X145CM (WIRE) ×5 IMPLANT
WIRE SPARTACORE .014X300CM (WIRE) ×5 IMPLANT

## 2015-07-15 NOTE — Op Note (Signed)
[facility] Percutaneous Study/Intervention Procedural Note   Date of Surgery: 07/15/2015  Surgeon:  Katha Cabal, MD.  Pre-operative Diagnosis: Atherosclerotic occlusive disease bilateral lower extremities with ulceration of the right heel; status post left above-knee amputation; diabetes mellitus  Post-operative diagnosis: Same  Procedure(s) Performed: 1. Introduction catheter into right lower extremity 3rd order catheter placement  2. Contrast injection right lower extremity for distal runoff   3. Percutaneous transluminal angioplasty and stent placement right anterior tibial artery  4. Star close closure left common femoral arteriotomy  Anesthesia: Conscious sedation with IV Versed and fentanyl  Sheath: 6 [place] Ansell  Contrast: 60 cc  Fluoroscopy Time: 20.5 minutes  Indications: Alaysha Sena presents with ulceration of the right heel which has not been improving with conservative therapies. She is therefore undergoing angiography with the hope for intervention for limb salvage. The risks and benefits are reviewed all questions answered patient agrees to proceed.  Procedure: Trey Hizer is a 81 y.o. y.o. female who was identified and appropriate procedural time out was performed. The patient was then placed supine on the table and prepped and draped in the usual sterile fashion.   Ultrasound was placed in the sterile sleeve and the left groin was evaluated the left common femoral artery was echolucent and pulsatile indicating patency.  Image was recorded for the permanent record and under real-time visualization a microneedle was inserted into the common femoral artery microwire followed by a micro-sheath.  A J-wire was then advanced through the micro-sheath and a  5 [place] sheath was then inserted over a J-wire. J-wire was then advanced and a 5 French pigtail catheter was positioned at  the level of T12. AP projection of the aorta was then obtained. Pigtail catheter was repositioned to above the bifurcation and a pigtail view of the pelvis was obtained.  Subsequently a rim catheter with the stiff angle Glidewire was used to cross the aortic bifurcation the catheter wire were advanced down into the right distal external iliac artery. Oblique view of the femoral bifurcation was then obtained and subsequently the wire was reintroduced and the pigtail catheter negotiated into the SFA representing third order catheter placement. Distal runoff was then performed.  5000 units of heparin was then given and allowed to circulate and a 6 [place] Ansell sheath was advanced up and over the bifurcation and positioned in the femoral artery  KMP  catheter and stiff angle Glidewire were then negotiated down into the distal popliteal.  Distal runoff was then completed by hand injection through the catheter. It was very difficult however using some flexion of the knee and a nearly lateral projection the popliteal artery behind her knee prosthesis was completely visualized and free of any hemodynamically significant stenosis.  The wire was then reintroduced and a angled glide catheter was negotiated down to the distal pigtail and in a relative AP projection the wire and catheter were negotiated into the anterior tibial where hand injection was used to complete imaging of the anterior tibial. Subsequent we have V-18 wire and a CXI catheter was used to negotiate down to the ankle the VAT wire was then exchanged for a [place] core which was negotiated out to the pedal arch.  A 2 mm x 30 cm balloon was used to angioplasty the anterior tibial arteries. Inflations were to 16 atmospheres for 2 minutes. A 3 mm x 8 cm balloon was then used to treat the proximal anterior tibial as well as the origin of the anterior  tibial again inflation was to approximately 14-16 atm for 1 minute. Follow-up imaging through the Kumpe  catheter with a Touey-Borst on it demonstrated patency with less than 5% residual stenosis along the entire course of the anterior tibial dorsalis pedis is also widely patent now and there is preservation of the distal runoff.  After review of these images the sheath is pulled into the left external iliac oblique of the common femoral is obtained and a Star close device deployed. There no immediate Complications.  Findings: The aorta and iliac system is widely patent. There is marked tortuosity and diffuse calcifications but there are no hemodynamically significant lesions identified. The right common femoral profunda femoris and superficial femoral arteries are diffusely diseased but again no hemodynamically significant lesions are noted. As noted above the entire popliteal artery was imaged and is found to be free of a hemodynamically significant stenosis. Trifurcation is patent. Anterior tibial demonstrated diffuse greater than 80% stenosis in its origin and first several centimeters as well as at 3 separate and distinct locations throughout it's course extending into the dorsalis pedis. However the pedal arch does appear to be intact. Peroneal is also patent down to the ankle but does not collateralize exceedingly well. Posterior tibial demonstrates multilevel occlusions with a long segment occlusion at the ankle with poor filling of the lateral plantar area  Following crossing of the lesions and hand injection through the CXI catheter to verify intraluminal placement a 2 mm inflation was made in the dorsalis pedis and distal anterior tibial and a 3 mm inflation was made in the anterior tibial more proximally. Follow-up imaging by direct injection demonstrates an excellent result with less than 5% residual stenosis and rapid flow of contrast now filling the pedal arch which is preserved.  Summary successful revascularization of the anterior tibial vessel as described above for limb  salvage   Disposition: Patient was taken to the recovery room in stable condition having tolerated the procedure well.  Lenox Ladouceur, Dolores Lory 07/01/2015,3:14 PM

## 2015-07-15 NOTE — Progress Notes (Signed)
approx 2 in ulcerated area right heel. wound care as per home done right heal and lower leg. Redness lower leg no drng noted. Son and daughter in-law at bedside.

## 2015-07-15 NOTE — H&P (Signed)
[facility] History & Physical Update  The patient was interviewed and re-examined.  The patient's previous History and Physical has been reviewed and is unchanged.  There is no change in the plan of care. We plan to proceed with the scheduled procedure.  Emilea Goga, Latina CraverGregory G, MD  07/15/2015, 3:41 PM

## 2015-07-15 NOTE — Discharge Instructions (Signed)
The drugs you were given will stay in your system until tomorrow, so for the next 24 hours you should not.  Drive an automobile. Make any legal decisions.  Drink any alcoholic beverages.  Today you should start with liquids and gradually work up to solid foods as your are able to tolerate them  Resume your regular medications as prescribed by your doctor. Take your Eliquis medication tonight.  Change the Band-Aid or dressing as needed.  After one day no dressing is needed.  Avoid strenuous activity for the remainder of the day.  Please notify your primary physician immediately if you have any unusual bleeding, trouble breathing, fever >100 degrees or pain not relieved by the medication your doctor prescribed for your doctor prescribed for you physician  Call the doctor for signs of bleeding or infection such as; bleeding, swelling, redness, pain not relieved by tylenol or ibuprofen, cloudy or yellow drainage and fevers

## 2015-07-16 ENCOUNTER — Encounter: Payer: Self-pay | Admitting: Vascular Surgery

## 2015-07-22 ENCOUNTER — Encounter: Payer: Medicare Other | Admitting: Surgery

## 2015-07-22 DIAGNOSIS — L97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: Secondary | ICD-10-CM | POA: Diagnosis not present

## 2015-07-22 NOTE — Progress Notes (Addendum)
[name], [name] ([id number]) Visit Report for 07/22/2015 Chief Complaint Document Details Patient Name: [name], [name] 07/22/2015 10:45 Date of Service: AM Medical Record [id number] Number: Patient Account Number: [id number] 12/20/1920 (81 y.o. Treating RN: Huel CoventryWoody, Kim Date of Birth/Sex: Female) Other Clinician: Primary Care Physician: Terance HartBRONSTEIN, DAVID Treating Evlyn KannerBritto, Reighn Kaplan Referring Physician: Dorothey BasemanBRONSTEIN, DAVID Physician/Extender: Tania AdeWeeks in Treatment: 23 Information Obtained from: Patient Chief Complaint Patient presents to the wound care center for a consult due non healing wound. 81 year old patient with comes with a history of a ulcerated area to the right third toe and the right heel which she's had for about 2 months. Electronic Signature(s) Signed: 07/22/2015 11:42:02 AM By: Evlyn KannerBritto, Faige Seely MD, FACS Entered By: Evlyn KannerBritto, Camauri Craton on 07/22/2015 11:42:02 [name], [name] ([id number]) -------------------------------------------------------------------------------- Debridement Details Patient Name: [name], [name] 07/22/2015 10:45 Date of Service: AM Medical Record [id number] Number: Patient Account Number: [id number] 06/18/1921 (81 y.o. Treating RN: Huel CoventryWoody, Kim Date of Birth/Sex: Female) Other Clinician: Primary Care Physician: Terance HartBRONSTEIN, DAVID Treating Bradly Sangiovanni Referring Physician: Dorothey BasemanBRONSTEIN, DAVID Physician/Extender: Tania AdeWeeks in Treatment: 23 Debridement Performed for Wound #1 Right Calcaneous Assessment: Performed By: Physician Evlyn KannerBritto, Keilon Ressel, MD Debridement: Debridement Pre-procedure Yes Verification/Time Out Taken: Start Time: 11:26 Pain Control: Other : lidocaine 4% Level: Skin/Subcutaneous Tissue Total Area Debrided (L x 1 (cm) x 1.2 (cm) = 1.2 (cm) W): Tissue and other Viable, Non-Viable, Exudate, Fibrin/Slough, Skin, Subcutaneous material debrided: Instrument: Curette Bleeding: Minimum Hemostasis Achieved: Pressure End Time: 11:29 Procedural Pain: 5 Post  Procedural Pain: 1 Response to Treatment: Procedure was tolerated well Post Debridement Measurements of Total Wound Length: (cm) 1 Width: (cm) 1.2 Depth: (cm) 0.3 Volume: (cm) 0.283 Post Procedure Diagnosis Same as Pre-procedure Electronic Signature(s) Signed: 07/22/2015 11:41:15 AM By: Evlyn KannerBritto, Isao Seltzer MD, FACS Signed: 07/23/2015 5:15:26 PM By: Elliot GurneyWoody, RN, BSN, Kim RN, BSN Entered By: Evlyn KannerBritto, Alianny Toelle on 07/22/2015 11:41:14 [name], [name] ([id number]) -------------------------------------------------------------------------------- Debridement Details Patient Name: [name], [name] 07/22/2015 10:45 Date of Service: AM Medical Record [id number] Number: Patient Account Number: [id number] 03/13/1921 (81 y.o. Treating RN: Huel CoventryWoody, Kim Date of Birth/Sex: Female) Other Clinician: Primary Care Physician: Terance HartBRONSTEIN, DAVID Treating Deondra Wigger Referring Physician: Dorothey BasemanBRONSTEIN, DAVID Physician/Extender: Tania AdeWeeks in Treatment: 23 Debridement Performed for Wound #6 Right,Posterior Lower Leg Assessment: Performed By: Physician Evlyn KannerBritto, Marketta Valadez, MD Debridement: Debridement Pre-procedure Yes Verification/Time Out Taken: Start Time: 11:26 Pain Control: Other : lidocaine 4% Level: Skin/Subcutaneous Tissue Total Area Debrided (L x 2 (cm) x 2.2 (cm) = 4.4 (cm) W): Tissue and other Viable, Non-Viable, Exudate, Fibrin/Slough, Skin, Subcutaneous material debrided: Instrument: Curette Bleeding: Minimum Hemostasis Achieved: Pressure End Time: 11:29 Procedural Pain: 5 Post Procedural Pain: 1 Response to Treatment: Procedure was tolerated well Post Debridement Measurements of Total Wound Length: (cm) 2 Width: (cm) 2.2 Depth: (cm) 0.2 Volume: (cm) 0.691 Post Procedure Diagnosis Same as Pre-procedure Electronic Signature(s) Signed: 07/22/2015 11:41:30 AM By: Evlyn KannerBritto, Khing Belcher MD, FACS Signed: 07/23/2015 5:15:26 PM By: Elliot GurneyWoody, RN, BSN, Kim RN, BSN Entered By: Evlyn KannerBritto, Niels Cranshaw on 07/22/2015  11:41:29 [name], [name] ([id number]) -------------------------------------------------------------------------------- Debridement Details Patient Name: [name], [name] 07/22/2015 10:45 Date of Service: AM Medical Record [id number] Number: Patient Account Number: [id number] 06/13/1921 (81 y.o. Treating RN: Huel CoventryWoody, Kim Date of Birth/Sex: Female) Other Clinician: Primary Care Physician: Terance HartBRONSTEIN, DAVID Treating Evlyn KannerBritto, Irene Collings Referring Physician: Dorothey BasemanBRONSTEIN, DAVID Physician/Extender: Tania AdeWeeks in Treatment: 23 Debridement Performed for Wound #8 Right Toe Fourth Assessment: Performed By: Physician Evlyn KannerBritto, Jaylin Benzel, MD Debridement: Open Wound/Selective Debridement Selective Description: Pre-procedure Yes Verification/Time Out Taken: Start Time: 11:26 Pain Control: Other : lidocaine 4% Level: Non-Viable Tissue Total Area Debrided (  L x 0.2 (cm) x 0.3 (cm) = 0.06 (cm) W): Tissue and other Viable, Non-Viable, Exudate, Fibrin/Slough material debrided: Instrument: Curette Bleeding: Minimum Hemostasis Achieved: Pressure End Time: 11:29 Procedural Pain: 5 Post Procedural Pain: 1 Response to Treatment: Procedure was tolerated well Post Debridement Measurements of Total Wound Length: (cm) 0.2 Width: (cm) 0.3 Depth: (cm) 0.1 Volume: (cm) 0.005 Post Procedure Diagnosis Same as Pre-procedure Electronic Signature(s) Signed: 07/22/2015 11:41:54 AM By: Evlyn Kanner MD, FACS Signed: 07/23/2015 5:15:26 PM By: Elliot Gurney RN, BSN, Kim RN, BSN Entered By: Evlyn Kanner on 07/22/2015 11:41:53 [name] Nations ([id number]) Marita [place], Elfrida ([id number]) -------------------------------------------------------------------------------- HPI Details Patient Name: [name], CANNADY 07/22/2015 10:45 Date of Service: AM Medical Record [id number] Number: Patient Account Number: [id number] [birth year] (81 y.o. Treating RN: Huel Coventry Date of Birth/Sex: Female) Other Clinician: Primary Care Physician:  Terance Hart, DAVID Treating Evlyn Kanner Referring Physician: Dorothey Baseman Physician/Extender: Tania Ade in Treatment: 23 History of Present Illness Location: right medial heel and right third toe Quality: Patient reports experiencing a dull pain to affected area(s). Severity: Patient states wound are getting worse. Duration: Patient has had the wound for > 3 months prior to seeking treatment at the wound center Timing: Pain in wound is Intermittent (comes and goes Context: The wound appeared gradually over time Modifying Factors: Consults to this date include:vascular surgeon and several recent surgeries. Associated Signs and Symptoms: Patient reports having foul odor and drainage from the left below-knee amputation site. HPI Description: A pleasant 81 year old patient who is known to have diabetes mellitus for several years has recently gone through a series of operations with the vascular surgeon Dr. Gilda Crease. In January and February she had several surgeries on the left lower extremity with an attempt to have limb salvage for gangrenous changes of her forefoot. 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 On 01/07/2015 she also had a right lower extremity distal runoff with a angioplasty of the right anterior tibial artery to maximize her blood flow to the foot. She recently had her staples removed at Dr. Marijean Heath office on 01/31/2015 and at that time a right lower extremity duplex was done which showed patent vessels and a patent stent with distal occluded posterior tibial artery. Though the duplex was noncritical the recommendations from the PA at the vascular surgery office was that of a angiogram to be done but the patient said she would rather try some bone care before. The patient has received doxycycline and Cipro in the recent past and she takes oral medications for her diabetes. Other than that I  reviewed her list of all her medications. No recent hemoglobin A1c has been done and no recent x-rays of the right foot have been done. 02/18/2015 -- x-ray of the right foot was done on 02/11/2015 and it shows no evidence of acute osteomyelitis of the third toe or of the calcaneus. She had gone to the vascular surgery office and they had noted that there is dehiscence of the left part of the amputation site of the below-knee wound and they have asked [place] to kindly take over the care of this. There've been doing dressings for the right heel and right third toe. 02/25/2015 -- we have received notes from the vascular group who saw her last on 02/13/2015 and she was seen by the PA Ms. Cleda Daub. She had recommended that the patient continue to follow with [place] for wound care including management of the left BKA stump, which  has had dehiscence of the lateral part. They will consider repeating a arterial duplex or angiogram if the right lower extremity does not heal within a reasonable period of time. 03/18/2015 -- he saw the vascular surgeon Dr. Gilda Crease and he has set her up for an angioplasty sometime BIERLY, Ndeye ([id number]) in the middle of July. she is doing well otherwise. 04/10/2015 -- the patient had a procedure done on 04/08/2015 and this was a right angioplasty of the dorsalis pedis, anterior tibial artery, first superficial femoral artery in its midportion. There was successful intervention with recanalization and in-line flow to the right foot. 04/22/2015 -- the patient was looking rather pale today and the daughter confirms that her hemoglobin was down to 6.9. she is being monitored by her PCP. Her Lasix dose was increased and her edema has gone down significantly. 04/29/2015 -- she had vascular studies done and the ABI on the right is 1.3 and the toe pressures were within normal limits. Her hemoglobin is still around 7 and she refuses to take any blood transfusions  for religious reasons. Her potassium was normal. 06/10/2015 -- she has a new blister on her right lateral and posterior part of her leg just in the region where the Kerlix bandages were applied and this may be due to an abrasion. 06/24/2015 -- On her right lower extremity she has developed several more blisters which look like small pustules and the drain informed shallow ulcerations. 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 07/01/2015 -- she has an appointment with the PCP this Friday and her vascular surgeon on Monday. Other than that she is on doxycycline and has finished 1 week of treatment. The pustules she had all over have now resolved. 07/08/2015 -- she saw the vascular surgeon who did studies in the office and found that there is poor circulation in the distal lower right leg and has set her up for an angiogram and possible stenting next Tuesday. 07/22/2015 -- on October 18 she was taken up for a successful revascularization of the anterior tibial vessel on the right side. a PTA and stent placement was done to the right anterior tibial artery. Electronic Signature(s) Signed: 07/22/2015 11:42:15 AM By: Evlyn Kanner MD, FACS Previous Signature: 07/22/2015 10:54:38 AM Version By: Evlyn Kanner MD, FACS Entered By: Evlyn Kanner on 07/22/2015 11:42:14 [name] Nations ([id number]) -------------------------------------------------------------------------------- Physical Exam Details Patient Name: AZILEE, PIRRO 07/22/2015 10:45 Date of Service: AM Medical Record [id number] Number: Patient Account Number: [id number] Dec 04, 1920 (81 y.o. Treating RN: Huel Coventry Date of Birth/Sex: Female) Other Clinician: Primary Care Physician: Terance Hart, DAVID Treating Evlyn Kanner Referring Physician: Dorothey Baseman Physician/Extender: Weeks in Treatment: 23 Constitutional . Pulse regular. Respirations normal and unlabored. Afebrile. . Eyes Nonicteric. Reactive to light. Ears, Nose, Mouth, and  Throat Lips, teeth, and gums WNL.Marland Kitchen Moist mucosa without lesions . Neck supple and nontender. No palpable supraclavicular or cervical adenopathy. Normal sized without goiter. Respiratory WNL. No retractions.. Cardiovascular Pedal Pulses WNL. No clubbing, cyanosis or edema. Lymphatic No adneopathy. No adenopathy. No adenopathy. Musculoskeletal Adexa without tenderness or enlargement.. Digits and nails w/o clubbing, cyanosis, infection, petechiae, ischemia, or inflammatory conditions.. Integumentary (Hair, Skin) No suspicious lesions. No crepitus or fluctuance. No peri-wound warmth or erythema. No masses.Marland Kitchen Psychiatric Judgement and insight Intact.. No evidence of depression, anxiety, or agitation.. Notes The wounds are all looking much better once the crust eschar and the subcutaneous debris was sharply dissected with a curette. Overall the wounds have moved in the right direction. Electronic Signature(s) Signed: 07/22/2015 11:42:57  AM By: Evlyn Kanner MD, FACS Entered By: Evlyn Kanner on 07/22/2015 11:42:57 [name] Nations ([id number]) -------------------------------------------------------------------------------- Physician Orders Details Patient Name: JANEECE, BLOK 07/22/2015 10:45 Date of Service: AM Medical Record [id number] Number: Patient Account Number: [id number] [birth year] (81 y.o. 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 Wound #6 Right,Posterior Lower Leg o Cleanse wound with mild soap and water o May Shower, gently pat wound dry prior to applying new dressing. Wound #7 Right,Anterior Lower Leg o Cleanse wound with mild soap and water o May Shower, gently pat wound dry prior to applying new dressing. Wound #8 Right Toe Fourth o Cleanse wound with mild soap and water o May Shower, gently pat wound dry prior to applying new dressing. Anesthetic Wound #1 Right Calcaneous o Topical Lidocaine 4% cream applied to wound bed prior to debridement Wound #6 Right,Posterior Lower Leg o Topical Lidocaine 4% cream applied to wound bed prior to debridement Glaus, Buna ([id number]) Wound #7 Right,Anterior Lower Leg o Topical Lidocaine 4% cream applied to wound bed prior to debridement Wound #8 Right Toe Fourth o Topical Lidocaine 4% cream applied to wound bed prior to debridement Primary Wound Dressing Wound #1 Right Calcaneous o Medihoney gel Wound #6 Right,Posterior Lower Leg o Medihoney gel Wound #7 Right,Anterior Lower Leg o Medihoney gel Wound #8 Right Toe Fourth o Medihoney gel Secondary Dressing Wound #1 Right Calcaneous o ABD pad o Gauze and Kerlix/Conform Wound #6 Right,Posterior Lower Leg o ABD pad o Gauze and Kerlix/Conform Wound #7 Right,Anterior Lower Leg o ABD pad o Gauze and Kerlix/Conform Wound #8 Right Toe Fourth o Gauze and Kerlix/Conform Dressing Change Frequency Wound #1 Right Calcaneous o Change dressing every other day. Wound #6 Right,Posterior Lower Leg o  Change dressing every other day. Wound #7 Right,Anterior Lower Leg o Change dressing every other day. Wound #8 Right Toe Fourth o Change dressing every other day. MOSKOWITZ, Neshia ([id number]) Follow-up Appointments Wound #1 Right Calcaneous o Return Appointment in 2 weeks. Wound #6 Right,Posterior Lower Leg o Return Appointment in 2 weeks. Wound #7 Right,Anterior Lower Leg o Return Appointment in 2 weeks. Wound #8 Right Toe Fourth o Return Appointment in 2 weeks. Home Health Wound #1 Right Calcaneous o Continue Home Health Visits - [facility] o Home Health Nurse may visit PRN to address patientos wound care needs. o FACE TO FACE ENCOUNTER: MEDICARE and MEDICAID PATIENTS: I certify that this patient is under my care and that I had a face-to-face encounter that meets the physician face-to-face encounter requirements with this patient on this date. The encounter with the patient was in whole or in part for the following MEDICAL CONDITION: (primary reason for Home Healthcare) MEDICAL NECESSITY: I certify, that based on my findings,  NURSING services are a medically necessary home health service. HOME BOUND STATUS: I certify that my clinical findings support that this patient is homebound (i.e., Due to illness or injury, pt requires aid of supportive devices such as crutches, cane, wheelchairs, walkers, the use of special transportation or the assistance of another person to leave their place of residence. There is a normal inability to leave the home and doing so requires considerable and taxing effort. Other absences are for medical reasons / religious services and are infrequent or of short duration when for other reasons). o If current dressing causes regression in wound condition, may D/C ordered dressing product/s and apply Normal Saline Moist Dressing daily until next Wound Healing Center / Other MD appointment. Notify Wound Healing Center of regression in wound  condition at [phone]. o Please direct any NON-WOUND related issues/requests for orders to patient's Primary Care Physician Wound #6 Right,Posterior Lower Leg o Continue Home Health Visits - [facility] o Home Health Nurse may visit PRN to address patientos wound care needs. o FACE TO FACE ENCOUNTER: MEDICARE and MEDICAID PATIENTS: I certify that this patient is under my care and that I had a face-to-face encounter that meets the physician face-to-face encounter requirements with this patient on this date. The encounter with the patient was in whole or in part for the following MEDICAL CONDITION: (primary reason for Home Healthcare) MEDICAL NECESSITY: I certify, that based on my findings, NURSING services are a medically necessary home health service. HOME BOUND STATUS: I certify that my clinical findings support that this patient is homebound (i.e., Due to illness or injury, pt requires aid of supportive devices such as crutches, cane, wheelchairs, walkers, the use of special transportation or the assistance of another person to leave their place of residence. There is a normal inability to leave the home and doing so requires considerable and taxing effort. Ceili Boshers, Shawnta ([id number]) absences are for medical reasons / religious services and are infrequent or of short duration when for other reasons). o If current dressing causes regression in wound condition, may D/C ordered dressing product/s and apply Normal Saline Moist Dressing daily until next Wound Healing Center / Other MD appointment. Notify Wound Healing Center of regression in wound condition at [phone]. o Please direct any NON-WOUND related issues/requests for orders to patient's Primary Care Physician Wound #7 Right,Anterior Lower Leg o Continue Home Health Visits - [facility] o Home Health Nurse may visit PRN to address patientos wound care needs. o FACE TO FACE ENCOUNTER: MEDICARE and MEDICAID  PATIENTS: I certify that this patient is under my care and that I had a face-to-face encounter that meets the physician face-to-face encounter requirements with this patient on this date. The encounter with the patient was in whole or in part for the following MEDICAL CONDITION: (primary reason for Home Healthcare) MEDICAL NECESSITY: I certify, that based on my findings, NURSING services are a medically necessary home health service. HOME BOUND STATUS: I certify that my clinical findings support that this patient is homebound (i.e., Due to illness or injury, pt requires aid of supportive devices such as crutches, cane, wheelchairs, walkers, the use of special transportation or the assistance of another person to leave their place of residence. There is a normal inability to leave the home and doing so requires considerable and taxing effort. Other absences are for medical reasons / religious services and are infrequent or of short duration when for other reasons). o If current dressing causes regression in wound condition, may D/C  ordered dressing product/s and apply Normal Saline Moist Dressing daily until next Wound Healing Center / Other MD appointment. Notify Wound Healing Center of regression in wound condition at [phone]. o Please direct any NON-WOUND related issues/requests for orders to patient's Primary Care Physician Wound #8 Right Toe Fourth o Continue Home Health Visits - [facility] o Home Health Nurse may visit PRN to address patientos wound care needs. o FACE TO FACE ENCOUNTER: MEDICARE and MEDICAID PATIENTS: I certify that this patient is under my care and that I had a face-to-face encounter that meets the physician face-to-face encounter requirements with this patient on this date. The encounter with the patient was in whole or in part for the following MEDICAL CONDITION: (primary reason for Home Healthcare) MEDICAL NECESSITY: I certify, that based on my findings,  NURSING services are a medically necessary home health service. HOME BOUND STATUS: I certify that my clinical findings support that this patient is homebound (i.e., Due to illness or injury, pt requires aid of supportive devices such as crutches, cane, wheelchairs, walkers, the use of special transportation or the assistance of another person to leave their place of residence. There is a normal inability to leave the home and doing so requires considerable and taxing effort. Other absences are for medical reasons / religious services and are infrequent or of short duration when for other reasons). o If current dressing causes regression in wound condition, may D/C ordered dressing product/s and apply Normal Saline Moist Dressing daily until next Wound Healing Center / Other MD appointment. Notify Wound Healing Center of regression in wound condition at [phone]. o Please direct any NON-WOUND related issues/requests for orders to patient's Primary Care Physician GLORINE, HANRATTY ([id number]) Electronic Signature(s) Signed: 07/22/2015 1:38:48 PM By: Evlyn Kanner MD, FACS Signed: 07/23/2015 5:15:26 PM By: Elliot Gurney RN, BSN, Kim RN, BSN Entered By: Elliot Gurney, RN, BSN, Kim on 07/22/2015 11:49:36 [name] Nations ([id number]) -------------------------------------------------------------------------------- Problem List Details Patient Name: ALIEA, BOBE 07/22/2015 10:45 Date of Service: AM Medical Record [id number] Number: Patient Account Number: [id number] [birth year] (81 y.o. Treating RN: Huel Coventry Date of Birth/Sex: Female) Other Clinician: Primary Care Physician: Terance Hart, DAVID Treating Evlyn Kanner Referring Physician: Dorothey Baseman Physician/Extender: Tania Ade in Treatment: 23 Active Problems ICD-10 Encounter Code Description Active Date Diagnosis E11.621 Type 2 diabetes mellitus with foot ulcer 02/11/2015 Yes I70.235 Atherosclerosis of native arteries of right leg with  02/11/2015 Yes ulceration of other part of foot Z89.512 Acquired absence of left leg below knee 02/11/2015 Yes L97.412 Non-pressure chronic ulcer of right heel and midfoot with 02/11/2015 Yes fat layer exposed L97.812 Non-pressure chronic ulcer of other part of right lower leg 02/11/2015 Yes with fat layer exposed T81.31XA Disruption of external operation (surgical) wound, not 02/18/2015 Yes elsewhere classified, initial encounter Inactive Problems Resolved Problems Electronic Signature(s) Signed: 07/22/2015 11:41:01 AM By: Evlyn Kanner MD, FACS Entered By: Evlyn Kanner on 07/22/2015 11:41:00 [name] Nations ([id number]) -------------------------------------------------------------------------------- Progress Note Details Patient Name: CHARNELL, PEPLINSKI 07/22/2015 10:45 Date of Service: AM Medical Record [id number] Number: Patient Account Number: [id number] 09-Sep-1921 (81 y.o. Treating RN: Huel Coventry Date of Birth/Sex: Female) Other Clinician: Primary Care Physician: Terance Hart, DAVID Treating Evlyn Kanner Referring Physician: Dorothey Baseman Physician/Extender: Tania Ade in Treatment: 23 Subjective Chief Complaint Information obtained from Patient Patient presents to the wound care center for a consult due non healing wound. 81 year old patient with comes with a history of a ulcerated area to the right third toe and the right heel which she's had for about 2 months. History of Present Illness (  HPI) The following HPI elements were documented for the patient's wound: Location: right medial heel and right third toe Quality: Patient reports experiencing a dull pain to affected area(s). Severity: Patient states wound are getting worse. Duration: Patient has had the wound for > 3 months prior to seeking treatment at the wound center Timing: Pain in wound is Intermittent (comes and goes Context: The wound appeared gradually over time Modifying Factors: Consults to this date  include:vascular surgeon and several recent surgeries. Associated Signs and Symptoms: Patient reports having foul odor and drainage from the left below-knee amputation site. A pleasant 81 year old patient who is known to have diabetes mellitus for several years has recently gone through a series of operations with the vascular surgeon Dr. Gilda Crease. In January and February she had several surgeries on the left lower extremity with an attempt to have limb salvage for gangrenous changes of her forefoot. 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 On 01/07/2015 she also had a right lower extremity distal runoff with a angioplasty of the right anterior tibial artery to maximize her blood flow to the foot. She recently had her staples removed at Dr. Marijean Heath office on 01/31/2015 and at that time a right lower extremity duplex was done which showed patent vessels and a patent stent with distal occluded posterior tibial artery. Though the duplex was noncritical the recommendations from the PA at the vascular surgery office was that of a angiogram to be done but the patient said she would rather try some bone care before. The patient has received doxycycline and Cipro in the recent past and she takes oral medications for her diabetes. Other than that I reviewed her list of all her medications. No recent hemoglobin A1c has been done and no recent x-rays of the right foot have been done. 02/18/2015 -- x-ray of the right foot was done on 02/11/2015 and it shows no evidence of acute osteomyelitis of the third toe or of the calcaneus. She had gone to the vascular surgery office and they had noted that there is dehiscence of the left part of Gatti, Evangelyn ([id number]) the amputation site of the below-knee wound and they have asked [place] to kindly take over the care of this. There've been doing dressings for the right heel and right  third toe. 02/25/2015 -- we have received notes from the vascular group who saw her last on 02/13/2015 and she was seen by the PA Ms. Cleda Daub. She had recommended that the patient continue to follow with [place] for wound care including management of the left BKA stump, which has had dehiscence of the lateral part. They will consider repeating a arterial duplex or angiogram if the right lower extremity does not heal within a reasonable period of time. 03/18/2015 -- he saw the vascular surgeon Dr. Gilda Crease and he has set her up for an angioplasty sometime in the middle of July. she is doing well otherwise. 04/10/2015 -- the patient had a procedure done on 04/08/2015 and this was a right angioplasty of the dorsalis pedis, anterior tibial artery, first superficial femoral artery in its midportion. There was successful intervention with recanalization and in-line flow to the right foot. 04/22/2015 -- the patient was looking rather pale today and the daughter confirms that her hemoglobin was down to 6.9. she is being monitored by her PCP. Her Lasix dose was increased and her edema has gone down significantly. 04/29/2015 -- she had vascular  studies done and the ABI on the right is 1.3 and the toe pressures were within normal limits. Her hemoglobin is still around 7 and she refuses to take any blood transfusions for religious reasons. Her potassium was normal. 06/10/2015 -- she has a new blister on her right lateral and posterior part of her leg just in the region where the Kerlix bandages were applied and this may be due to an abrasion. 06/24/2015 -- On her right lower extremity she has developed several more blisters which look like small pustules and the drain informed shallow ulcerations. 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 07/01/2015 -- she has an appointment with the PCP this Friday and her vascular surgeon on Monday. Other than that she is on doxycycline and has finished 1 week of  treatment. The pustules she had all over have now resolved. 07/08/2015 -- she saw the vascular surgeon who did studies in the office and found that there is poor circulation in the distal lower right leg and has set her up for an angiogram and possible stenting next Tuesday. 07/22/2015 -- on October 18 she was taken up for a successful revascularization of the anterior tibial vessel on the right side. a PTA and stent placement was done to the right anterior tibial artery. Objective Constitutional Pulse regular. Respirations normal and unlabored. Afebrile. Vitals Time Taken: 11:11 AM, Height: 62 in, Weight: 155 lbs, BMI: 28.3, Temperature: 97.6 F, Pulse: 53 bpm, Respiratory Rate: 18 breaths/min, Blood Pressure: 100/44 mmHg. Esselman, Dyanne ([id number]) Eyes Nonicteric. Reactive to light. Ears, Nose, Mouth, and Throat Lips, teeth, and gums WNL.Marland Kitchen Moist mucosa without lesions . Neck supple and nontender. No palpable supraclavicular or cervical adenopathy. Normal sized without goiter. Respiratory WNL. No retractions.. Cardiovascular Pedal Pulses WNL. No clubbing, cyanosis or edema. Lymphatic No adneopathy. No adenopathy. No adenopathy. Musculoskeletal Adexa without tenderness or enlargement.. Digits and nails w/o clubbing, cyanosis, infection, petechiae, ischemia, or inflammatory conditions.Marland Kitchen Psychiatric Judgement and insight Intact.. No evidence of depression, anxiety, or agitation.. General Notes: The wounds are all looking much better once the crust eschar and the subcutaneous debris was sharply dissected with a curette. Overall the wounds have moved in the right direction. Integumentary (Hair, Skin) No suspicious lesions. No crepitus or fluctuance. No peri-wound warmth or erythema. No masses.. Wound #1 status is Open. Original cause of wound was Gradually Appeared. The wound is located on the Right Calcaneous. The wound measures 1cm length x 1.2cm width x 0.1cm depth; 0.942cm^2  area and 0.094cm^3 volume. The wound is limited to skin breakdown. There is a medium amount of serosanguineous drainage noted. The wound margin is distinct with the outline attached to the wound base. There is medium (34-66%) pink, pale granulation within the wound bed. There is a medium (34-66%) amount of necrotic tissue within the wound bed. The periwound skin appearance exhibited: Dry/Scaly. The periwound skin appearance did not exhibit: Callus, Crepitus, Excoriation, Fluctuance, Friable, Induration, Localized Edema, Rash, Scarring, Maceration, Moist, Atrophie Blanche, Cyanosis, Ecchymosis, Hemosiderin Staining, Mottled, Pallor, Rubor, Erythema. Wound #6 status is Open. Original cause of wound was Blister. The wound is located on the Right,Posterior Lower Leg. The wound measures 2cm length x 2.2cm width x 0.1cm depth; 3.456cm^2 area and 0.346cm^3 volume. Wound #7 status is Open. Original cause of wound was Gradually Appeared. The wound is located on the Right,Anterior Lower Leg. The wound measures 0.3cm length x 0.5cm width x 0.1cm depth; 0.118cm^2 area and 0.012cm^3 volume. Wound #8 status is Open. Original cause of wound was Gradually  Appeared. The wound is located on the [place] ([id number]) Right Toe Fourth. The wound measures 0.2cm length x 0.3cm width x 0.1cm depth; 0.047cm^2 area and 0.005cm^3 volume. 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 This should be done daily and all questions regarding this. 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 There was a Skin/Subcutaneous Tissue Debridement (40981-19147) debridement with total area of 1.2 sq cm performed by Evlyn Kanner, MD. with the following instrument(s): Curette to remove Viable and Non-Viable tissue/material including Exudate, Fibrin/Slough, Skin, and Subcutaneous after achieving pain control using Other (lidocaine 4%). A time out was conducted prior to the start of the procedure. A Minimum amount of bleeding was controlled with Pressure. The procedure was tolerated well with a pain level of 5 throughout and a pain level of 1 following the procedure. Post Debridement Measurements: 1cm length x 1.2cm width x 0.3cm depth; 0.283cm^3 volume. Post procedure Diagnosis Wound #1: Same as Pre-Procedure Wound #6 Wound #6 is an Arterial Insufficiency Ulcer located on the Right,Posterior Lower Leg . There was a Skin/Subcutaneous Tissue Debridement ([id number]) debridement with total area of 4.4 sq cm performed by Evlyn Kanner, MD. with the following instrument(s): Curette to remove Viable and Non-Viable tissue/material including Exudate, Fibrin/Slough, Skin, and Subcutaneous after achieving pain control using Other (lidocaine 4%). A time out was conducted prior to the start of the procedure. A Minimum amount of Haywood, Ophelia ([id number]) bleeding was controlled with Pressure. The procedure was tolerated well with a pain level of 5 throughout and a pain level of 1 following the procedure. Post Debridement Measurements: 2cm length x 2.2cm width x 0.2cm depth; 0.691cm^3 volume. Post procedure Diagnosis Wound #6: Same as Pre-Procedure Wound #8 Wound #8 is a Diabetic Wound/Ulcer of  the Lower Extremity located on the Right Toe Fourth . There was a Non-Viable Tissue Open Wound/Selective [phone]) debridement with total area of 0.06 sq cm performed by Evlyn Kanner, MD. with the following instrument(s): Curette to remove Viable and Non-Viable tissue/material including Exudate and Fibrin/Slough after achieving pain control using Other (lidocaine 4%). A time out was conducted prior to the start of the procedure. A Minimum amount of bleeding was controlled with Pressure. The procedure was tolerated well with a pain level of 5 throughout and a pain level of 1 following the procedure. Post Debridement Measurements: 0.2cm length x 0.3cm width x 0.1cm depth; 0.005cm^3 volume. Post procedure Diagnosis Wound #8: Same as Pre-Procedure Plan Wound Cleansing: Wound #1 Right Calcaneous: Cleanse wound with mild soap and water May Shower, gently pat wound dry prior to applying new dressing. Wound #6 Right,Posterior Lower Leg: Cleanse wound with mild soap and water May Shower, gently pat wound dry prior to applying new dressing. Wound #7 Right,Anterior Lower Leg: Cleanse wound with mild soap and water May Shower, gently pat  wound dry prior to applying new dressing. Wound #8 Right Toe Fourth: Cleanse wound with mild soap and water May Shower, gently pat wound dry prior to applying new dressing. Anesthetic: Wound #1 Right Calcaneous: Topical Lidocaine 4% cream applied to wound bed prior to debridement Wound #6 Right,Posterior Lower Leg: Topical Lidocaine 4% cream applied to wound bed prior to debridement Wound #7 Right,Anterior Lower Leg: Topical Lidocaine 4% cream applied to wound bed prior to debridement Wound #8 Right Toe Fourth: Topical Lidocaine 4% cream applied to wound bed prior to debridement Primary Wound Dressing: Wound #1 Right Calcaneous: Medihoney gel Wound #6 Right,Posterior Lower Leg: Niesen, [place] ([id number]) Medihoney gel Wound #7 Right,Anterior Lower  Leg: Medihoney gel Wound #8 Right Toe Fourth: Medihoney gel Secondary Dressing: Wound #1 Right Calcaneous: ABD pad Gauze and Kerlix/Conform Wound #6 Right,Posterior Lower Leg: ABD pad Gauze and Kerlix/Conform Wound #7 Right,Anterior Lower Leg: ABD pad Gauze and Kerlix/Conform Wound #8 Right Toe Fourth: Gauze and Kerlix/Conform Dressing Change Frequency: Wound #1 Right Calcaneous: Change dressing every other day. Wound #6 Right,Posterior Lower Leg: Change dressing every other day. Wound #7 Right,Anterior Lower Leg: Change dressing every other day. Wound #8 Right Toe Fourth: Change dressing every other day. Follow-up Appointments: Wound #1 Right Calcaneous: Return Appointment in 2 weeks. Wound #6 Right,Posterior Lower Leg: Return Appointment in 2 weeks. Wound #7 Right,Anterior Lower Leg: Return Appointment in 2 weeks. Wound #8 Right Toe Fourth: Return Appointment in 2 weeks. Home Health: Wound #1 Right Calcaneous: Continue Home Health Visits - [facility] Nurse may visit PRN to address patient s wound care needs. FACE TO FACE ENCOUNTER: MEDICARE and MEDICAID PATIENTS: I certify that this patient is under my care and that I had a face-to-face encounter that meets the physician face-to-face encounter requirements with this patient on this date. The encounter with the patient was in whole or in part for the following MEDICAL CONDITION: (primary reason for Home Healthcare) MEDICAL NECESSITY: I certify, that based on my findings, NURSING services are a medically necessary home health service. HOME BOUND STATUS: I certify that my clinical findings support that this patient is homebound (i.e., Due to illness or injury, pt requires aid of supportive devices such as crutches, cane, wheelchairs, walkers, the use of special transportation or the assistance of another person to leave their place of residence. There is a normal inability to leave the home and doing so requires  considerable and taxing effort. Other absences are for medical reasons / religious services and are infrequent or of short duration when for other reasons). If current dressing causes regression in wound condition, may D/C ordered dressing product/s and apply Normal Saline Moist Dressing daily until next Wound Healing Center / Other MD appointment. Notify Wound Canoy, Jya ([id number]) Healing Center of regression in wound condition at [phone]. Please direct any NON-WOUND related issues/requests for orders to patient's Primary Care Physician Wound #6 Right,Posterior Lower Leg: Continue Home Health Visits - [facility] Nurse may visit PRN to address patient s wound care needs. FACE TO FACE ENCOUNTER: MEDICARE and MEDICAID PATIENTS: I certify that this patient is under my care and that I had a face-to-face encounter that meets the physician face-to-face encounter requirements with this patient on this date. The encounter with the patient was in whole or in part for the following MEDICAL CONDITION: (primary reason for Home Healthcare) MEDICAL NECESSITY: I certify, that based on my findings, NURSING services are a medically necessary home health service. HOME BOUND STATUS: I certify  that my clinical findings support that this patient is homebound (i.e., Due to illness or injury, pt requires aid of supportive devices such as crutches, cane, wheelchairs, walkers, the use of special transportation or the assistance of another person to leave their place of residence. There is a normal inability to leave the home and doing so requires considerable and taxing effort. Other absences are for medical reasons / religious services and are infrequent or of short duration when for other reasons). If current dressing causes regression in wound condition, may D/C ordered dressing product/s and apply Normal Saline Moist Dressing daily until next Wound Healing Center / Other MD appointment. Notify  Wound Healing Center of regression in wound condition at [phone]. Please direct any NON-WOUND related issues/requests for orders to patient's Primary Care Physician Wound #7 Right,Anterior Lower Leg: Continue Home Health Visits - [facility] Nurse may visit PRN to address patient s wound care needs. FACE TO FACE ENCOUNTER: MEDICARE and MEDICAID PATIENTS: I certify that this patient is under my care and that I had a face-to-face encounter that meets the physician face-to-face encounter requirements with this patient on this date. The encounter with the patient was in whole or in part for the following MEDICAL CONDITION: (primary reason for Home Healthcare) MEDICAL NECESSITY: I certify, that based on my findings, NURSING services are a medically necessary home health service. HOME BOUND STATUS: I certify that my clinical findings support that this patient is homebound (i.e., Due to illness or injury, pt requires aid of supportive devices such as crutches, cane, wheelchairs, walkers, the use of special transportation or the assistance of another person to leave their place of residence. There is a normal inability to leave the home and doing so requires considerable and taxing effort. Other absences are for medical reasons / religious services and are infrequent or of short duration when for other reasons). If current dressing causes regression in wound condition, may D/C ordered dressing product/s and apply Normal Saline Moist Dressing daily until next Wound Healing Center / Other MD appointment. Notify Wound Healing Center of regression in wound condition at [phone]. Please direct any NON-WOUND related issues/requests for orders to patient's Primary Care Physician Wound #8 Right Toe Fourth: Continue Home Health Visits - [facility] Nurse may visit PRN to address patient s wound care needs. FACE TO FACE ENCOUNTER: MEDICARE and MEDICAID PATIENTS: I certify that this  patient is under my care and that I had a face-to-face encounter that meets the physician face-to-face encounter requirements with this patient on this date. The encounter with the patient was in whole or in part for the following MEDICAL CONDITION: (primary reason for Home Healthcare) MEDICAL NECESSITY: I certify, that based on my findings, NURSING services are a medically necessary home health service. HOME BOUND STATUS: I certify that my clinical findings support that this patient is homebound (i.e., Due to illness or injury, pt requires aid of supportive devices such as crutches, cane, wheelchairs, walkers, the use of special transportation or the assistance of another person to leave their place of residence. There is a normal inability to leave the home and doing so requires considerable and taxing effort. Other absences are for medical reasons / religious services and are infrequent or of short duration when for other reasons). If current dressing causes regression in wound condition, may D/C ordered dressing product/s and apply Normal Saline Moist Dressing daily until next Wound Healing Center / Other MD appointment. Notify Wound BROPHY, Della ([id number]) Healing Center  of regression in wound condition at [phone]. Please direct any NON-WOUND related issues/requests for orders to patient's Primary Care Physician I have discussed with the patient's daughter-in-law who is the caregiver to wash with soap and water and then apply Medihoney sparingly over this before doing the dressing. This should be done daily and all questions regarding this. She will come back to see me next week Electronic Signature(s) Signed: 07/22/2015 4:28:34 PM By: Evlyn Kanner MD, FACS Previous Signature: 07/22/2015 11:44:20 AM Version By: Evlyn Kanner MD, FACS Entered By: Evlyn Kanner on 07/22/2015 16:28:34 [name] Nations  ([id number]) -------------------------------------------------------------------------------- SuperBill Details Patient Name: [name] Nations Date of Service: 07/22/2015 Medical Record Number: [id number] Patient Account Number: [id number] Date of Birth/Sex: May 09, 1921 (81 y.o. 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 L97.812 Non-pressure chronic ulcer of other part of right lower leg with fat layer exposed Disruption of external operation (surgical) wound, not elsewhere classified, initial T81.31XA encounter Facility Procedures CPT4: Description Modifier Quantity Code [id number] 11042 - DEB SUBQ TISSUE 20 SQ CM/< 1 ICD-10 Description Diagnosis E11.621 Type 2 diabetes mellitus with foot ulcer I70.235 Atherosclerosis of native arteries of right leg with ulceration of other part of  foot L97.812 Non-pressure chronic ulcer of other part of right lower leg with fat layer exposed 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 CPT4: 30865784 97597 - DEBRIDE WOUND 1ST 20 SQ CM OR < 1 ICD-10 Description Diagnosis I70.235 Atherosclerosis of native arteries of right leg with ulceration of other part of foot E11.621 Type 2 diabetes mellitus with foot ulcer T81.31XA Disruption of  external operation (surgical) wound, not elsewhere classified, initial encounter 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

## 2015-07-24 NOTE — Progress Notes (Signed)
DENICIA, PAGLIARULO ([id number]) Visit Report for 07/22/2015 Arrival Information Details Patient Name: [name], [name] Date of Service: 07/22/2015 10:45 AM Medical Record Number: [id number] Patient Account Number: [id number] Date of Birth/Sex: 12-Aug-1921 (81 y.o. Female) Treating RN: Huel Coventry Primary Care Physician: Dorothey Baseman Other Clinician: Referring Physician: Dorothey Baseman Treating Physician/Extender: Rudene Re in Treatment: 23 Visit Information History Since Last Visit Added or deleted any medications: Yes Patient Arrived: Wheel Chair Any new allergies or adverse reactions: No Arrival Time: 11:10 Had a fall or experienced change in No activities of daily living that may affect Accompanied By: self risk of falls: Transfer Assistance: Manual Signs or symptoms of abuse/neglect since last No Patient Identification Verified: Yes visito Secondary Verification Process Yes Hospitalized since last visit: No Completed: Has Dressing in Place as Prescribed: Yes Patient Has Alerts: Yes Pain Present Now: No Patient Alerts: DMII Electronic Signature(s) Signed: 07/23/2015 5:15:26 PM By: Elliot Gurney, RN, BSN, Kim RN, BSN Entered By: Elliot Gurney, RN, BSN, Kim on 07/22/2015 11:10:28 [name] ([id number]) -------------------------------------------------------------------------------- Encounter Discharge Information Details Patient Name: [name] Date of Service: 07/22/2015 10:45 AM Medical Record Number: [id number] Patient Account Number: [id number] Date of Birth/Sex: May 17, 1921 (81 y.o. Female) Treating RN: Huel Coventry Primary Care Physician: Dorothey Baseman Other Clinician: Referring Physician: Dorothey Baseman Treating Physician/Extender: Rudene Re in Treatment: 69 Encounter Discharge Information Items Discharge Pain Level: 0 Discharge Condition: Stable Ambulatory Status: Wheelchair Discharge Destination: Home Transportation: Private Auto Accompanied  By: dughter in law Schedule Follow-up Appointment: Yes Medication Reconciliation completed and provided to Patient/Care Yes Ell Tiso: Provided on Clinical Summary of Care: 07/22/2015 Form Type Recipient Paper Patient HS Electronic Signature(s) Signed: 07/23/2015 5:15:26 PM By: Elliot Gurney RN, BSN, Kim RN, BSN Previous Signature: 07/22/2015 11:49:31 AM Version By: Gwenlyn Perking Entered By: Elliot Gurney RN, BSN, Kim on 07/22/2015 11:50:31 [name] ([id number]) -------------------------------------------------------------------------------- Lower Extremity Assessment Details Patient Name: [name] Date of Service: 07/22/2015 10:45 AM Medical Record Number: [id number] Patient Account Number: [id number] Date of Birth/Sex: 1921/06/13 (81 y.o. Female) Treating RN: Huel Coventry Primary Care Physician: Dorothey Baseman Other Clinician: Referring Physician: Dorothey Baseman Treating Physician/Extender: Rudene Re in Treatment: 23 Vascular Assessment Pulses: Posterior Tibial Palpable: [Right:No] Doppler: [Right:Inaudible] Dorsalis Pedis Palpable: [Right:No] Doppler: [Right:Monophasic] Extremity colors, hair growth, and conditions: Extremity Color: [Right:Pale] Hair Growth on Extremity: [Right:No] Temperature of Extremity: [Right:Cool] Capillary Refill: [Right:> 3 seconds] Toe Nail Assessment Left: Right: Thick: No Discolored: No Deformed: No Improper Length and Hygiene: No Electronic Signature(s) Signed: 07/23/2015 5:15:26 PM By: Elliot Gurney, RN, BSN, Kim RN, BSN Entered By: Elliot Gurney, RN, BSN, Kim on 07/22/2015 11:20:40 [name] ([id number]) -------------------------------------------------------------------------------- Multi Wound Chart Details Patient Name: [name] Date of Service: 07/22/2015 10:45 AM Medical Record Number: [id number] Patient Account Number: [id number] Date of Birth/Sex: 26-Sep-1921 (81 y.o. 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] Comorbid History: [1:Arrhythmia, Deep Vein Thrombosis, Peripheral Arterial Disease, Type II Diabetes] [6:N/A] [7:N/A] Date Acquired: [1:11/04/2014] [6:06/12/2015] [7:06/18/2015] Weeks of Treatment: [1:23] [6:5] [7:4] Wound Status: [1:Open] [6:Open] [7:Open] Measurements L x W x D 1x1.2x0.1 [6:2x2.2x0.1] [7:0.3x0.5x0.1] (  cm) Area (cm) : [1:0.942] [6:3.456] [7:0.118] Volume (cm) : [1:0.094] [6:0.346] [7:0.012] % Reduction in Area: [1:82.90%] [6:-340.30%] [7:50.00%] % Reduction in Volume: 82.90% [6:-338.00%] [7:74.50%] Classification: [1:Full Thickness Without Exposed Support Structures] [6:Full Thickness Without Exposed Support Structures] [7:Grade 1] HBO Classification: [1:Grade 1] [6:N/A] [7:N/A] Exudate Amount: [1:Medium] [6:N/A] [7:N/A] Exudate Type: [1:Serosanguineous] [6:N/A] [7:N/A] Exudate Color: [1:red, brown] [6:N/A] [7:N/A] Wound Margin: [1:Distinct, outline attached N/A] [7:N/A] Granulation Amount: [1:Medium (34-66%)] [6:N/A] [7:N/A] Granulation Quality: [1:Pink, Pale] [6:N/A] [7:N/A] Necrotic Amount: [1:Medium (34-66%)] [6:N/A] [7:N/A] Exposed Structures: Fascia: No N/A N/A Fat: No Tendon: No Muscle: No Joint: No Bone: No Limited to Skin Breakdown Epithelialization: Small (1-33%) N/A  N/A Debridement: Debridement ([id number](11042- Debridement ([id number](11042- N/A 11047) 11047) Time-Out Taken: Yes Yes N/A Pain Control: Other Other N/A Tissue Debrided: Fibrin/Slough, Exudates, Fibrin/Slough, Exudates, N/A Skin, Subcutaneous Skin, Subcutaneous Level: Skin/Subcutaneous Skin/Subcutaneous N/A Tissue Tissue Debridement Area (sq 1.2 4.4 N/A cm): Instrument: Curette Curette N/A Bleeding: Minimum Minimum N/A Hemostasis Achieved: Pressure Pressure N/A Procedural Pain: 5 5 N/A Post Procedural Pain: 1 1 N/A Debridement Treatment Procedure was tolerated Procedure was tolerated N/A Response: well well Post Debridement 1x1.2x0.3 2x2.2x0.2 N/A Measurements L x W x D (cm) Post Debridement 0.283 0.691 N/A Volume: (cm) Periwound Skin Texture: Edema: No No Abnormalities Noted No Abnormalities Noted Excoriation: No Induration: No Callus: No Crepitus: No Fluctuance: No Friable: No Rash: No Scarring: No Periwound Skin Dry/Scaly: Yes No Abnormalities Noted No Abnormalities Noted Moisture: Maceration: No Moist: No Periwound Skin Color: Atrophie Blanche: No No Abnormalities Noted No Abnormalities Noted Cyanosis: No Ecchymosis: No Erythema: No Hemosiderin Staining: No Mottled: No Wassel, Yarelis ([id number]) Pallor: No Rubor: No Tenderness on No No No Palpation: Wound Preparation: Ulcer Cleansing: N/A N/A Rinsed/Irrigated with Saline Topical Anesthetic Applied: Other: lidocaine 4% Procedures Performed: Debridement Debridement N/A Wound Number: 8 N/A N/A Photos: No Photos N/A N/A Wound Location: Right Toe Fourth N/A N/A Wounding Event: Gradually Appeared N/A N/A Primary Etiology: Diabetic Wound/Ulcer of N/A N/A the Lower Extremity Comorbid History: N/A N/A N/A Date Acquired: 06/09/2015 N/A N/A Weeks of Treatment: 4 N/A N/A Wound Status: Open N/A N/A Measurements L x W x D 0.2x0.3x0.1 N/A N/A (cm) Area (cm) : 0.047 N/A N/A Volume (cm) : 0.005 N/A N/A % Reduction in Area:  33.80% N/A N/A % Reduction in Volume: 28.60% N/A N/A Classification: Unable to visualize wound N/A N/A bed HBO Classification: N/A N/A N/A Exudate Amount: N/A N/A N/A Exudate Type: N/A N/A N/A Exudate Color: N/A N/A N/A Wound Margin: N/A N/A N/A Granulation Amount: N/A N/A N/A Granulation Quality: N/A N/A N/A Necrotic Amount: N/A N/A N/A Exposed Structures: N/A N/A N/A Epithelialization: N/A N/A N/A Debridement: Open Wound/Selective N/A N/A ([id number]) - Selective Time-Out Taken: Yes N/A N/A Pain Control: Other N/A N/A Tissue Debrided: Fibrin/Slough, Exudates N/A N/A Level: Non-Viable Tissue N/A N/A Debridement Area (sq 0.06 N/A N/A cm): [name], [name] ([id number]) Instrument: Curette N/A N/A Bleeding: Minimum N/A N/A Hemostasis Achieved: Pressure N/A N/A Procedural Pain: 5 N/A N/A Post Procedural Pain: 1 N/A N/A Debridement Treatment Procedure was tolerated N/A N/A Response: well Post Debridement 0.2x0.3x0.1 N/A N/A Measurements L x W x D (cm) Post Debridement 0.005 N/A N/A Volume: (cm) Periwound Skin Texture: No Abnormalities Noted N/A N/A Periwound Skin No Abnormalities Noted N/A N/A Moisture: Periwound Skin Color: No Abnormalities Noted N/A N/A Tenderness on No N/A N/A Palpation: Wound Preparation: N/A N/A N/A Procedures Performed: Debridement N/A N/A Treatment Notes Electronic Signature(s) Signed: 07/23/2015 5:15:26 PM By: Elliot GurneyWoody, RN,  BSN, Kim RN, BSN Entered By: Elliot Gurney, RN, BSN, Kim on 07/22/2015 11:47:29 [name] ([id number]) -------------------------------------------------------------------------------- Multi-Disciplinary Care Plan Details Patient Name: [name] Date of Service: 07/22/2015 10:45 AM Medical Record Number: [id number] Patient Account Number: [id number] Date of Birth/Sex: July 13, 1921 (81 y.o. Female) Treating RN: Huel Coventry Primary Care Physician: Dorothey Baseman Other Clinician: Referring Physician: Dorothey Baseman Treating  Physician/Extender: Rudene Re in Treatment: 56 Active Inactive Abuse / Safety / Falls / Self Care Management Nursing Diagnoses: Impaired physical mobility Potential for falls Goals: Patient will remain injury free Date Initiated: 02/11/2015 Goal Status: Active Patient/caregiver will verbalize understanding of skin care regimen Date Initiated: 02/11/2015 Goal Status: Active Patient/caregiver will verbalize/demonstrate measures taken to prevent injury and/or falls Date Initiated: 02/11/2015 Goal Status: Active Patient/caregiver will verbalize/demonstrate understanding of what to do in case of emergency Date Initiated: 02/11/2015 Goal Status: Active Interventions: Assess fall risk on admission and as needed Provide education on fall prevention Provide education on safe transfers Treatment Activities: Patient referred to home care : 07/22/2015 Notes: Nutrition Nursing Diagnoses: Imbalanced nutrition Potential for alteratiion in Nutrition/Potential for imbalanced nutrition [name], [name] ([id number]) Goals: Patient/caregiver verbalizes understanding of need to maintain therapeutic glucose control per primary care physician Date Initiated: 02/11/2015 Goal Status: Active Patient/caregiver will maintain therapeutic glucose control Date Initiated: 02/11/2015 Goal Status: Active Interventions: Assess HgA1c results as ordered upon admission and as needed Provide education on elevated blood sugars and impact on wound healing Provide education on nutrition Treatment Activities: Education provided on Nutrition : 04/22/2015 Obtain HgA1c : 07/22/2015 Notes: Wound/Skin Impairment Nursing Diagnoses: Impaired tissue integrity Knowledge deficit related to ulceration/compromised skin integrity Goals: Patient/caregiver will verbalize understanding of skin care regimen Date Initiated: 02/11/2015 Goal Status: Active Ulcer/skin breakdown will heal within 14 weeks Date Initiated:  02/11/2015 Goal Status: Active Interventions: Assess patient/caregiver ability to obtain necessary supplies Assess patient/caregiver ability to perform ulcer/skin care regimen upon admission and as needed Assess ulceration(s) every visit Provide education on ulcer and skin care Treatment Activities: Skin care regimen initiated : 07/22/2015 Topical wound management initiated : 07/22/2015 Notes: [name], [name] ([id number]) Electronic Signature(s) Signed: 07/23/2015 5:15:26 PM By: Elliot Gurney, RN, BSN, Kim RN, BSN Entered By: Elliot Gurney, RN, BSN, Kim on 07/22/2015 11:47:20 [name] ([id number]) -------------------------------------------------------------------------------- Pain Assessment Details Patient Name: [name] Date of Service: 07/22/2015 10:45 AM Medical Record Number: [id number] Patient Account Number: [id number] Date of Birth/Sex: 10/14/1920 (81 y.o. Female) Treating RN: Huel Coventry Primary Care Physician: Dorothey Baseman Other Clinician: Referring Physician: Dorothey Baseman Treating Physician/Extender: Rudene Re in Treatment: 23 Active Problems Location of Pain Severity and Description of Pain Patient Has Paino No Site Locations Pain Management and Medication Current Pain Management: Electronic Signature(s) Signed: 07/23/2015 5:15:26 PM By: Elliot Gurney, RN, BSN, Kim RN, BSN Entered By: Elliot Gurney, RN, BSN, Kim on 07/22/2015 11:10:35 [name] ([id number]) -------------------------------------------------------------------------------- Patient/Caregiver Education Details Patient Name: [name] Date of Service: 07/22/2015 10:45 AM Medical Record Number: [id number] Patient Account Number: [id number] Date of Birth/Gender: 12-Jun-1921 (81 y.o. Female) Treating RN: Huel Coventry Primary Care Physician: Dorothey Baseman Other Clinician: Referring Physician: Dorothey Baseman Treating Physician/Extender: Rudene Re in Treatment: 77 Education  Assessment Education Provided To: Patient and Caregiver Education Topics Provided Wound/Skin Impairment: Handouts: Other: continue wound care as prescribed Methods: Demonstration, Explain/Verbal Responses: State content correctly Electronic Signature(s) Signed: 07/23/2015 5:15:26 PM By: Elliot Gurney, RN, BSN, Kim RN, BSN Entered By: Elliot Gurney, RN, BSN, Kim on 07/22/2015 11:50:59 [name] ([id number]) -------------------------------------------------------------------------------- Wound Assessment Details Patient Name: [name] Date of Service:  07/22/2015 10:45 AM Medical Record Number: [id number] Patient Account Number: [id number] Date of Birth/Sex: [birth year] (81 y.o. Female) Treating RN: Huel Coventry Primary Care Physician: Dorothey Baseman Other Clinician: Referring Physician: Dorothey Baseman Treating Physician/Extender: Rudene Re in Treatment: 23 Wound Status Wound Number: 1 Primary Arterial Insufficiency Ulcer Etiology: Wound Location: Right Calcaneous Wound Open Wounding Event: Gradually Appeared Status: Date Acquired: 11/04/2014 Comorbid Arrhythmia, Deep Vein Thrombosis, Weeks Of Treatment: 23 History: Peripheral Arterial Disease, Type II Clustered Wound: No Diabetes Photos Photo Uploaded By: Elliot Gurney, RN, BSN, Kim on 07/22/2015 17:11:17 Wound Measurements Length: (cm) 1 Width: (cm) 1.2 Depth: (cm) 0.1 Area: (cm) 0.942 Volume: (cm) 0.094 % Reduction in Area: 82.9% % Reduction in Volume: 82.9% Epithelialization: Small (1-33%) Wound Description Full Thickness Without Exposed Classification: Support Structures Diabetic Severity Grade 1 (Wagner): Difatta, Brytani ([id number]) Foul Odor After Cleansing: No Wound Margin: Distinct, outline attached Exudate Amount: Medium Exudate Type: Serosanguineous Exudate Color: red, brown Wound Bed Granulation Amount: Medium (34-66%) Exposed Structure Granulation Quality: Pink, Pale Fascia Exposed:  No Necrotic Amount: Medium (34-66%) Fat Layer Exposed: No Tendon Exposed: No Muscle Exposed: No Joint Exposed: No Bone Exposed: No Limited to Skin Breakdown Periwound Skin Texture Texture Color No Abnormalities Noted: No No Abnormalities Noted: No Callus: No Atrophie Blanche: No Crepitus: No Cyanosis: No Excoriation: No Ecchymosis: No Fluctuance: No Erythema: No Friable: No Hemosiderin Staining: No Induration: No Mottled: No Localized Edema: No Pallor: No Rash: No Rubor: No Scarring: No Moisture No Abnormalities Noted: No Dry / Scaly: Yes Maceration: No Moist: No Wound Preparation Ulcer Cleansing: Rinsed/Irrigated with Saline Topical Anesthetic Applied: Other: lidocaine 4%, Treatment Notes Wound #1 (Right Calcaneous) 1. Cleansed with: Clean wound with Normal Saline 2. Anesthetic Topical Lidocaine 4% cream to wound bed prior to debridement 4. Dressing Applied: Medihoney Gel 5. Secondary Dressing Applied SCHMIESING, Masyn ([id number]) ABD Pad Gauze and Kerlix/Conform Electronic Signature(s) Signed: 07/23/2015 5:15:26 PM By: Elliot Gurney, RN, BSN, Kim RN, BSN Entered By: Elliot Gurney, RN, BSN, Kim on 07/22/2015 11:21:55 [name] ([id number]) -------------------------------------------------------------------------------- Wound Assessment Details Patient Name: [name] Date of Service: 07/22/2015 10:45 AM Medical Record Number: [id number] Patient Account Number: [id number] Date of Birth/Sex: 1921-05-28 (81 y.o. Female) Treating RN: Huel Coventry Primary Care Physician: Dorothey Baseman Other Clinician: Referring Physician: Dorothey Baseman Treating Physician/Extender: Rudene Re in Treatment: 23 Wound Status Wound Number: 6 Primary Etiology: Arterial Insufficiency Ulcer Wound Location: Right, Posterior Lower Leg Wound Status: Open Wounding Event: Blister Date Acquired: 06/12/2015 Weeks Of Treatment: 5 Clustered Wound: No Photos Photo Uploaded By:  Elliot Gurney, RN, BSN, Kim on 07/22/2015 17:11:17 Wound Measurements Length: (cm) 2 Width: (cm) 2.2 Depth: (cm) 0.1 Area: (cm) 3.456 Volume: (cm) 0.346 % Reduction in Area: -340.3% % Reduction in Volume: -338% Wound Description Full Thickness Without Exposed Classification: Support Structures Periwound Skin Texture Texture Color No Abnormalities Noted: No No Abnormalities Noted: No Moisture No Abnormalities Noted: No Treatment Notes Wound #6 (Right, Posterior Lower Leg) [name], [name] ([id number]) 1. Cleansed with: Clean wound with Normal Saline 2. Anesthetic Topical Lidocaine 4% cream to wound bed prior to debridement 4. Dressing Applied: Medihoney Gel 5. Secondary Dressing Applied ABD Pad Gauze and Kerlix/Conform Electronic Signature(s) Signed: 07/23/2015 5:15:26 PM By: Elliot Gurney, RN, BSN, Kim RN, BSN Entered By: Elliot Gurney, RN, BSN, Kim on 07/22/2015 11:21:37 [name] ([id number]) -------------------------------------------------------------------------------- Wound Assessment Details Patient Name: [name] Date of Service: 07/22/2015 10:45 AM Medical Record Number: [id number] Patient Account Number: [id number] Date of Birth/Sex: 1920/10/25 (81 y.o. Female) Treating RN: Huel Coventry Primary  Care Physician: Terance Hart, DAVID Other Clinician: Referring Physician: Dorothey Baseman Treating Physician/Extender: Rudene Re in Treatment: 23 Wound Status Wound Number: 7 Primary Diabetic Wound/Ulcer of the Lower Etiology: Extremity Wound Location: Right, Anterior Lower Leg Wound Status: Open Wounding Event: Gradually Appeared Date Acquired: 06/18/2015 Weeks Of Treatment: 4 Clustered Wound: No Photos Photo Uploaded By: Elliot Gurney, RN, BSN, Kim on 07/22/2015 17:11:43 Wound Measurements Length: (cm) 0.3 Width: (cm) 0.5 Depth: (cm) 0.1 Area: (cm) 0.118 Volume: (cm) 0.012 % Reduction in Area: 50% % Reduction in Volume: 74.5% Wound Description Classification:  Grade 1 Periwound Skin Texture Texture Color No Abnormalities Noted: No No Abnormalities Noted: No Moisture No Abnormalities Noted: No Treatment Notes Wound #7 (Right, Anterior Lower Leg) 1. Cleansed with: [name], [name] ([id number]) Clean wound with Normal Saline 2. Anesthetic Topical Lidocaine 4% cream to wound bed prior to debridement 4. Dressing Applied: Medihoney Gel 5. Secondary Dressing Applied ABD Pad Gauze and Kerlix/Conform Electronic Signature(s) Signed: 07/23/2015 5:15:26 PM By: Elliot Gurney, RN, BSN, Kim RN, BSN Entered By: Elliot Gurney, RN, BSN, Kim on 07/22/2015 11:21:37 [name] ([id number]) -------------------------------------------------------------------------------- Wound Assessment Details Patient Name: [name] Date of Service: 07/22/2015 10:45 AM Medical Record Number: [id number] Patient Account Number: [id number] Date of Birth/Sex: Jun 26, 1921 (81 y.o. Female) Treating RN: Huel Coventry Primary Care Physician: Dorothey Baseman Other Clinician: Referring Physician: Terance Hart, DAVID Treating Physician/Extender: Rudene Re in Treatment: 23 Wound Status Wound Number: 8 Primary Diabetic Wound/Ulcer of the Lower Etiology: Extremity Wound Location: Right Toe Fourth Wound Status: Open Wounding Event: Gradually Appeared Date Acquired: 06/09/2015 Weeks Of Treatment: 4 Clustered Wound: No Photos Photo Uploaded By: Elliot Gurney, RN, BSN, Kim on 07/22/2015 17:11:44 Wound Measurements Length: (cm) 0.2 Width: (cm) 0.3 Depth: (cm) 0.1 Area: (cm) 0.047 Volume: (cm) 0.005 % Reduction in Area: 33.8% % Reduction in Volume: 28.6% Wound Description Classification: Unable to visualize wound bed Periwound Skin Texture Texture Color No Abnormalities Noted: No No Abnormalities Noted: No Moisture No Abnormalities Noted: No Treatment Notes Wound #8 (Right Toe Fourth) 1. Cleansed with: [name], [name] ([id number]) Clean wound with Normal Saline 2.  Anesthetic Topical Lidocaine 4% cream to wound bed prior to debridement 4. Dressing Applied: Medihoney Gel 5. Secondary Dressing Applied ABD Pad Gauze and Kerlix/Conform Electronic Signature(s) Signed: 07/23/2015 5:15:26 PM By: Elliot Gurney, RN, BSN, Kim RN, BSN Entered By: Elliot Gurney, RN, BSN, Kim on 07/22/2015 11:21:37 [name] ([id number]) -------------------------------------------------------------------------------- Vitals Details Patient Name: [name] Date of Service: 07/22/2015 10:45 AM Medical Record Number: [id number] Patient Account Number: [id number] Date of Birth/Sex: [birth year] (81 y.o. Female) Treating RN: Huel Coventry Primary Care Physician: Dorothey Baseman Other Clinician: Referring Physician: Dorothey Baseman Treating Physician/Extender: Rudene Re in Treatment: 23 Vital Signs Time Taken: 11:11 Temperature (F): 97.6 Height (in): 62 Pulse (bpm): 53 Weight (lbs): 155 Respiratory Rate (breaths/min): 18 Body Mass Index (BMI): 28.3 Blood Pressure (mmHg): 100/44 Reference Range: 80 - 120 mg / dl Electronic Signature(s) Signed: 07/23/2015 5:15:26 PM By: Elliot Gurney, RN, BSN, Kim RN, BSN Entered By: Elliot Gurney, RN, BSN, Kim on 07/22/2015 11:11:49

## 2015-08-05 ENCOUNTER — Encounter: Payer: Medicare Other | Attending: Surgery | Admitting: Surgery

## 2015-08-05 DIAGNOSIS — L97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: Secondary | ICD-10-CM | POA: Diagnosis not present

## 2015-08-05 DIAGNOSIS — E11621 Type 2 diabetes mellitus with foot ulcer: Secondary | ICD-10-CM | POA: Insufficient documentation

## 2015-08-05 DIAGNOSIS — Z89512 Acquired absence of left leg below knee: Secondary | ICD-10-CM | POA: Diagnosis not present

## 2015-08-05 DIAGNOSIS — L97812 Non-pressure chronic ulcer of other part of right lower leg with fat layer exposed: Secondary | ICD-10-CM | POA: Insufficient documentation

## 2015-08-05 DIAGNOSIS — X58XXXA Exposure to other specified factors, initial encounter: Secondary | ICD-10-CM | POA: Diagnosis not present

## 2015-08-05 DIAGNOSIS — I70235 Atherosclerosis of native arteries of right leg with ulceration of other part of foot: Secondary | ICD-10-CM | POA: Insufficient documentation

## 2015-08-05 DIAGNOSIS — T8131XA Disruption of external operation (surgical) wound, not elsewhere classified, initial encounter: Secondary | ICD-10-CM | POA: Diagnosis not present

## 2015-08-06 NOTE — Progress Notes (Signed)
[name] ([id number]) Visit Report for 08/05/2015 Chief Complaint Document Details Patient Name: [name], [name] Date of Service: 08/05/2015 10:00 AM Medical Record Number: [id number] Patient Account Number: [id number] Date of Birth/Sex: 03-Jul-1921 (81 y.o. Female) Treating RN: Curtis Sites Primary Care Physician: Dorothey Baseman Other Clinician: Referring Physician: Dorothey Baseman Treating Physician/Extender: Rudene Re in Treatment: 25 Information Obtained from: Patient Chief Complaint Patient presents to the wound care center for a consult due non healing wound. 81 year old patient with comes with a history of a ulcerated area to the right third toe and the right heel which she's had for about 2 months. Electronic Signature(s) Signed: 08/05/2015 10:48:52 AM By: Evlyn Kanner MD, FACS Entered By: Evlyn Kanner on 08/05/2015 10:48:52 [name] ([id number]) -------------------------------------------------------------------------------- HPI Details Patient Name: [name] Date of Service: 08/05/2015 10:00 AM Medical Record Number: [id number] Patient Account Number: [id number] Date of Birth/Sex: [birth year] (81 y.o. Female) Treating RN: Curtis Sites Primary Care Physician: Dorothey Baseman Other Clinician: Referring Physician: Dorothey Baseman Treating Physician/Extender: Rudene Re in Treatment: 25 History of Present Illness Location: right medial heel and right third toe Quality: Patient reports experiencing a dull pain to affected area(s). Severity: Patient states wound are getting worse. Duration: Patient has had the wound for > 3 months prior to seeking treatment at the wound center Timing: Pain in wound is Intermittent (comes and goes Context: The wound appeared gradually over time Modifying Factors: Consults to this date include:vascular surgeon and several recent surgeries. Associated Signs and Symptoms: Patient reports having foul odor  and drainage from the [name]. HPI Description: A pleasant 81 year old patient who is known to have diabetes mellitus for several years has recently gone through a series of operations with the vascular surgeon Dr. Gilda Crease. In January and February she had several surgeries on [name]. 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 On 01/07/2015 she also had a right lower extremity distal runoff with a angioplasty of the right anterior tibial artery to maximize her blood flow to the foot. She recently had her staples removed at Dr. Marijean Heath office on 01/31/2015 and at that time a right lower extremity duplex was done which showed patent vessels and a patent stent with distal occluded posterior tibial artery. Though the duplex was noncritical the recommendations from the PA at the vascular surgery office was that of a angiogram to be done but the patient said she would rather try some bone care before. The patient has received doxycycline and Cipro in the recent past and she takes oral medications for her diabetes. Other than that I reviewed her list of all her medications. No recent hemoglobin A1c has been done and no recent x-rays of the right foot have been done. 02/18/2015 -- x-ray of the right foot was done on 02/11/2015 and it shows no evidence of acute osteomyelitis of the third toe or of the calcaneus. She had gone to the vascular surgery office and they had noted that there is dehiscence of [name] of the below-knee wound and they have asked [place] to kindly take over the care of this. There've been doing dressings for the right heel and right third toe. 02/25/2015 -- we have received notes from the vascular group who saw her last on 02/13/2015 and she was seen by the PA Ms.  Cleda Daub. She had recommended that the patient continue to follow with [place] for wound care including management of [name], which has had dehiscence of the lateral part. They will consider repeating a arterial duplex or angiogram if the right lower extremity does not heal within a reasonable period of time. 03/18/2015 -- he saw the vascular surgeon Dr. Gilda Crease and he has set her up for an angioplasty sometime in the middle of July. she is doing well otherwise. [name], [name] ([id number]) 04/10/2015 -- the patient had a procedure done on 04/08/2015 and this was a right angioplasty of the dorsalis pedis, anterior tibial artery, first superficial femoral artery in its midportion. There was successful intervention with recanalization and in-line flow to the right foot. 04/22/2015 -- the patient was looking rather pale today and the daughter confirms that her hemoglobin was down to 6.9. she is being monitored by her PCP. Her Lasix dose was increased and her edema has gone down significantly. 04/29/2015 -- she had vascular studies done and the ABI on the right is 1.3 and the toe pressures were within normal limits. Her hemoglobin is still around 7 and she refuses to take any blood transfusions for religious reasons. Her potassium was normal. 06/10/2015 -- she has a new blister on her right lateral and posterior part of her leg just in the region where the Kerlix bandages were applied and this may be due to an abrasion. 06/24/2015 -- On her right lower extremity she has developed several more blisters which look like small pustules and the drain informed shallow ulcerations. 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 07/01/2015 -- she has an appointment with the PCP this Friday and her vascular surgeon on Monday. Other than that she is on doxycycline and has finished 1 week of treatment. The pustules she had all over have now resolved. 07/08/2015 -- she saw the vascular surgeon who did  studies in the office and found that there is poor circulation in the distal lower right leg and has set her up for an angiogram and possible stenting next Tuesday. 07/22/2015 -- on October 18 she was taken up for a successful revascularization of the anterior tibial vessel on the right side. a PTA and stent placement was done to the right anterior tibial artery. Electronic Signature(s) Signed: 08/05/2015 10:49:04 AM By: Evlyn Kanner MD, FACS Entered By: Evlyn Kanner on 08/05/2015 10:49:02 [name] ([id number]) -------------------------------------------------------------------------------- Physical Exam Details Patient Name: [name] Date of Service: 08/05/2015 10:00 AM Medical Record Number: [id number] Patient Account Number: [id number] Date of Birth/Sex: 31-Jan-1921 (81 y.o. Female) Treating RN: Curtis Sites Primary Care Physician: Dorothey Baseman Other Clinician: Referring Physician: Dorothey Baseman Treating Physician/Extender: Rudene Re in Treatment: 25 Constitutional . Pulse regular. Respirations normal and unlabored. Afebrile. . Eyes Nonicteric. Reactive to light. Ears, Nose, Mouth, and Throat Lips, teeth, and gums WNL.Marland Kitchen Moist mucosa without lesions . Neck supple and nontender. No palpable supraclavicular or cervical adenopathy. Normal sized without goiter. Respiratory WNL. No retractions.. Cardiovascular Pedal Pulses WNL. No clubbing, cyanosis or edema. Lymphatic No adneopathy. No adenopathy. No adenopathy. Musculoskeletal Adexa without tenderness or enlargement.. Digits and nails w/o clubbing, cyanosis, infection, petechiae, ischemia, or inflammatory conditions.. Integumentary (Hair, Skin) No suspicious lesions. No crepitus or fluctuance. No peri-wound warmth or erythema. No masses.Marland Kitchen Psychiatric Judgement and insight Intact.. No evidence of depression, anxiety, or agitation.. Notes all the wounds in the right lower extremity are looking much  smaller and cleaner and no curettage was required today. Electronic Signature(s)  Signed: 08/05/2015 10:49:36 AM By: Evlyn Kanner MD, FACS Entered By: Evlyn Kanner on 08/05/2015 10:49:35 [name] ([id number]) -------------------------------------------------------------------------------- Physician Orders Details Patient Name: [name] Date of Service: 08/05/2015 10:00 AM Medical Record Number: [id number] Patient Account Number: [id number] Date of Birth/Sex: [birth year] (81 y.o. Female) Treating RN: Curtis Sites Primary Care Physician: Dorothey Baseman Other Clinician: Referring Physician: Dorothey Baseman Treating Physician/Extender: Rudene Re in Treatment: 23 Verbal / Phone Orders: Yes Clinician: Curtis Sites Read Back and Verified: Yes Diagnosis Coding Wound Cleansing Wound #1 Right Calcaneous o Cleanse wound with mild soap and water o May Shower, gently pat wound dry prior to applying new dressing. Wound #6 Right,Posterior Lower Leg o Cleanse wound with mild soap and water o May Shower, gently pat wound dry prior to applying new dressing. Wound #7 Right,Anterior Lower Leg o Cleanse wound with mild soap and water o May Shower, gently pat wound dry prior to applying new dressing. Anesthetic Wound #1 Right Calcaneous o Topical Lidocaine 4% cream applied to wound bed prior to debridement Wound #6 Right,Posterior Lower Leg o Topical Lidocaine 4% cream applied to wound bed prior to debridement Wound #7 Right,Anterior Lower Leg o Topical Lidocaine 4% cream applied to wound bed prior to debridement Primary Wound Dressing Wound #1 Right Calcaneous o Medihoney gel Wound #6 Right,Posterior Lower Leg o Medihoney gel Wound #7 Right,Anterior Lower Leg o Medihoney gel Secondary Dressing Wound #1 Right Calcaneous o ABD pad o Gauze and Kerlix/Conform Wicklund, Danyiel ([id number]) Wound #6 Right,Posterior Lower Leg o ABD pad o  Gauze and Kerlix/Conform Wound #7 Right,Anterior Lower Leg o ABD pad o Gauze and Kerlix/Conform Dressing Change Frequency Wound #1 Right Calcaneous o Change dressing every other day. Wound #6 Right,Posterior Lower Leg o Change dressing every other day. Wound #7 Right,Anterior Lower Leg o Change dressing every other day. Follow-up Appointments Wound #1 Right Calcaneous o Return Appointment in 2 weeks. Wound #6 Right,Posterior Lower Leg o Return Appointment in 2 weeks. Wound #7 Right,Anterior Lower Leg o Return Appointment in 2 weeks. Home Health Wound #1 Right Calcaneous o Continue Home Health Visits - [facility] o Home Health Nurse may visit PRN to address patientos wound care needs. o FACE TO FACE ENCOUNTER: MEDICARE and MEDICAID PATIENTS: I certify that this patient is under my care and that I had a face-to-face encounter that meets the physician face-to-face encounter requirements with this patient on this date. The encounter with the patient was in whole or in part for the following MEDICAL CONDITION: (primary reason for Home Healthcare) MEDICAL NECESSITY: I certify, that based on my findings, NURSING services are a medically necessary home health service. HOME BOUND STATUS: I certify that my clinical findings support that this patient is homebound (i.e., Due to illness or injury, pt requires aid of supportive devices such as crutches, cane, wheelchairs, walkers, the use of special transportation or the assistance of another person to leave their place of residence. There is a normal inability to leave the home and doing so requires considerable and taxing effort. Other absences are for medical reasons / religious services and are infrequent or of short duration when for other reasons). o If current dressing causes regression in wound condition, may D/C ordered dressing product/s and apply Normal Saline Moist Dressing daily until next Wound Healing Center  / Other MD appointment. Notify Wound Healing Center of regression in wound condition at [phone]. [name], [name] ([id number]) o Please direct any NON-WOUND related issues/requests for orders to patient's Primary Care Physician Wound #6  Right,Posterior Lower Leg o Continue Home Health Visits - [facility] o Home Health Nurse may visit PRN to address patientos wound care needs. o FACE TO FACE ENCOUNTER: MEDICARE and MEDICAID PATIENTS: I certify that this patient is under my care and that I had a face-to-face encounter that meets the physician face-to-face encounter requirements with this patient on this date. The encounter with the patient was in whole or in part for the following MEDICAL CONDITION: (primary reason for Home Healthcare) MEDICAL NECESSITY: I certify, that based on my findings, NURSING services are a medically necessary home health service. HOME BOUND STATUS: I certify that my clinical findings support that this patient is homebound (i.e., Due to illness or injury, pt requires aid of supportive devices such as crutches, cane, wheelchairs, walkers, the use of special transportation or the assistance of another person to leave their place of residence. There is a normal inability to leave the home and doing so requires considerable and taxing effort. Other absences are for medical reasons / religious services and are infrequent or of short duration when for other reasons). o If current dressing causes regression in wound condition, may D/C ordered dressing product/s and apply Normal Saline Moist Dressing daily until next Wound Healing Center / Other MD appointment. Notify Wound Healing Center of regression in wound condition at [phone]. o Please direct any NON-WOUND related issues/requests for orders to patient's Primary Care Physician Wound #7 Right,Anterior Lower Leg o Continue Home Health Visits - [facility] o Home Health Nurse may visit PRN to address  patientos wound care needs. o FACE TO FACE ENCOUNTER: MEDICARE and MEDICAID PATIENTS: I certify that this patient is under my care and that I had a face-to-face encounter that meets the physician face-to-face encounter requirements with this patient on this date. The encounter with the patient was in whole or in part for the following MEDICAL CONDITION: (primary reason for Home Healthcare) MEDICAL NECESSITY: I certify, that based on my findings, NURSING services are a medically necessary home health service. HOME BOUND STATUS: I certify that my clinical findings support that this patient is homebound (i.e., Due to illness or injury, pt requires aid of supportive devices such as crutches, cane, wheelchairs, walkers, the use of special transportation or the assistance of another person to leave their place of residence. There is a normal inability to leave the home and doing so requires considerable and taxing effort. Other absences are for medical reasons / religious services and are infrequent or of short duration when for other reasons). o If current dressing causes regression in wound condition, may D/C ordered dressing product/s and apply Normal Saline Moist Dressing daily until next Wound Healing Center / Other MD appointment. Notify Wound Healing Center of regression in wound condition at [phone]. o Please direct any NON-WOUND related issues/requests for orders to patient's Primary Care Physician Electronic Signature(s) Signed: 08/05/2015 4:34:10 PM By: Evlyn Kanner MD, FACS Signed: 08/05/2015 5:19:50 PM By: Mechele Dawley, Annemarie ([id number]) Entered By: Curtis Sites on 08/05/2015 10:39:40 [name] ([id number]) -------------------------------------------------------------------------------- Problem List Details Patient Name: [name] Date of Service: 08/05/2015 10:00 AM Medical Record Number: [id number] Patient Account Number: [id number] Date of  Birth/Sex: [birth year] (81 y.o. 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 02/11/2015 Yes fat layer exposed L97.812 Non-pressure chronic ulcer of other part of right lower leg 02/11/2015 Yes with fat layer exposed T81.31XA Disruption of external operation (surgical) wound, not 02/18/2015 Yes elsewhere classified, initial encounter Inactive Problems Resolved Problems Electronic Signature(s) Signed: 08/05/2015 10:48:45 AM By: Evlyn Kanner MD, FACS Entered By: Evlyn Kanner on 08/05/2015 10:48:44 [name] ([id number]) -------------------------------------------------------------------------------- Progress Note Details Patient Name: [name] Date of Service: 08/05/2015 10:00 AM Medical Record Number: [id number] Patient Account Number: [id number] Date of Birth/Sex: May 12, 1921 (81 y.o. Female) Treating RN: Curtis Sites Primary Care Physician: Dorothey Baseman Other Clinician: Referring Physician: Dorothey Baseman Treating Physician/Extender: Rudene Re in Treatment: 25 Subjective Chief Complaint Information obtained from Patient Patient presents to the wound care center for a consult due non healing wound. 81 year old patient with comes with a history of a ulcerated area to the right third toe and the right heel which she's had for about 2 months. History of Present Illness (HPI) The following HPI elements were documented for the patient's  wound: Location: right medial heel and right third toe Quality: Patient reports experiencing a dull pain to affected area(s). Severity: Patient states wound are getting worse. Duration: Patient has had the wound for > 3 months prior to seeking treatment at the wound center Timing: Pain in wound is Intermittent (comes and goes Context: The wound appeared gradually over time Modifying Factors: Consults to this date include:vascular surgeon and several recent surgeries. Associated Signs and Symptoms: Patient reports having foul odor and drainage from the [name]. A pleasant 81 year old patient who is known to have diabetes mellitus for several years has recently gone through a series of operations with the vascular surgeon Dr. Gilda Crease. In January and February she had several surgeries on [name]. 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 On 01/07/2015 she also had a right lower extremity distal runoff with a angioplasty of the right anterior tibial artery to maximize her blood flow to the foot. She recently had her staples removed at Dr. Marijean Heath office on 01/31/2015 and at that time a right lower extremity duplex was done which showed patent vessels and a patent stent with distal occluded posterior tibial artery. Though the duplex was noncritical the recommendations from the PA at the vascular surgery office was that of a angiogram to be done but the patient said she would rather try some bone care before. The patient has received doxycycline and Cipro in the recent past and she takes oral medications for her diabetes. Other than that I reviewed her list of all her medications. No recent hemoglobin A1c has been done and no recent x-rays of the right foot have been done. 02/18/2015 -- x-ray of the right  foot was done on 02/11/2015 and it shows no evidence of acute osteomyelitis of the third toe or of the calcaneus. She had gone to the vascular surgery office and they had noted that there is dehiscence of [name] of the below-knee wound and they have asked [place] to kindly take over the care of this. There've been doing dressings for the right heel and right third toe. [name], [name] ([id number]) 02/25/2015 --  we have received notes from the vascular group who saw her last on 02/13/2015 and she was seen by the PA Ms. Cleda DaubKimberly Stegmayer. She had recommended that the patient continue to follow with us for wound care including management of [name], which has had dehiscence of the lateral part. They will consider repeating a arterial duplex or angiogram if the right lower extremity does not heal within a reasonable period of time. 03/18/2015 -- he saw the vascular surgeon Dr. Gilda CreaseSchnier and he has set her up for an angioplasty sometime in the middle of July. she is doing well otherwise. 04/10/2015 -- the patient had a procedure done on 04/08/2015 and this was a right angioplasty of the dorsalis pedis, anterior tibial artery, first superficial femoral artery in its midportion. There was successful intervention with recanalization and in-line flow to the right foot. 04/22/2015 -- the patient was looking rather pale today and the daughter confirms that her hemoglobin was down to 6.9. she is being monitored by her PCP. Her Lasix dose was increased and her edema has gone down significantly. 04/29/2015 -- she had vascular studies done and the ABI on the right is 1.3 and the toe pressures were within normal limits. Her hemoglobin is still around 7 and she refuses to take any blood transfusions for religious reasons. Her potassium was normal. 06/10/2015 -- she has a new blister on her right lateral and posterior part of her leg just in the region where the Kerlix bandages  were applied and this may be due to an abrasion. 06/24/2015 -- On her right lower extremity she has developed several more blisters which look like small pustules and the drain informed shallow ulcerations. 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 07/01/2015 -- she has an appointment with the PCP this Friday and her vascular surgeon on Monday. Other than that she is on doxycycline and has finished 1 week of treatment. The pustules she had all over have now resolved. 07/08/2015 -- she saw the vascular surgeon who did studies in the office and found that there is poor circulation in the distal lower right leg and has set her up for an angiogram and possible stenting next Tuesday. 07/22/2015 -- on October 18 she was taken up for a successful revascularization of the anterior tibial vessel on the right side. a PTA and stent placement was done to the right anterior tibial artery. Objective Constitutional Pulse regular. Respirations normal and unlabored. Afebrile. Vitals Time Taken: 10:08 AM, Height: 62 in, Weight: 155 lbs, BMI: 28.3, Temperature: 97.8 F, Pulse: 56 bpm, Respiratory Rate: 18 breaths/min, Blood Pressure: 118/50 mmHg. Eyes Nonicteric. Reactive to light. [name], [name] ([id number]) Ears, Nose, Mouth, and Throat Lips, teeth, and gums WNL.Marland Kitchen. Moist mucosa without lesions . Neck supple and nontender. No palpable supraclavicular or cervical adenopathy. Normal sized without goiter. Respiratory WNL. No retractions.. Cardiovascular Pedal Pulses WNL. No clubbing, cyanosis or edema. Lymphatic No adneopathy. No adenopathy. No adenopathy. Musculoskeletal Adexa without tenderness or enlargement.. Digits and nails w/o clubbing, cyanosis, infection, petechiae, ischemia, or inflammatory conditions.Marland Kitchen. Psychiatric Judgement and insight Intact.. No evidence of depression, anxiety, or agitation.. General Notes: all the wounds in the right lower extremity are looking much smaller and cleaner and  no curettage was required today. Integumentary (Hair, Skin) No suspicious lesions. No crepitus or fluctuance. No peri-wound warmth or erythema. No masses.. Wound #1 status is Open. Original cause of wound was Gradually Appeared. The wound is located on the Right Calcaneous. The wound measures 0.4cm length x  0.7cm width x 0.1cm depth; 0.22cm^2 area and 0.022cm^3 volume. The wound is limited to skin breakdown. There is no tunneling or undermining noted. There is a medium amount of serosanguineous drainage noted. The wound margin is distinct with the outline attached to the wound base. There is medium (34-66%) pink, pale granulation within the wound bed. There is a medium (34-66%) amount of necrotic tissue within the wound bed. The periwound skin appearance exhibited: Dry/Scaly. The periwound skin appearance did not exhibit: Callus, Crepitus, Excoriation, Fluctuance, Friable, Induration, Localized Edema, Rash, Scarring, Maceration, Moist, Atrophie Blanche, Cyanosis, Ecchymosis, Hemosiderin Staining, Mottled, Pallor, Rubor, Erythema. Wound #6 status is Open. Original cause of wound was Blister. The wound is located on the Right,Posterior Lower Leg. The wound measures 1.6cm length x 1.7cm width x 0.1cm depth; 2.136cm^2 area and 0.214cm^3 volume. The wound is limited to skin breakdown. There is no tunneling or undermining noted. There is a none present amount of drainage noted. The wound margin is flat and intact. There is medium (34-66%) pink granulation within the wound bed. There is a medium (34-66%) amount of necrotic tissue within the wound bed including Adherent Slough. The periwound skin appearance did not exhibit: Callus, Crepitus, Excoriation, Fluctuance, Friable, Induration, Localized Edema, Rash, Scarring, Dry/Scaly, Maceration, Moist, Atrophie Blanche, Cyanosis, Ecchymosis, Hemosiderin Staining, Mottled, Pallor, Rubor, Erythema. The periwound has tenderness on palpation. Wound #7 status  is Open. Original cause of wound was Gradually Appeared. The wound is located on the [name], [name] ([id number]) Right,Anterior Lower Leg. The wound measures 0.2cm length x 0.3cm width x 0.1cm depth; 0.047cm^2 area and 0.005cm^3 volume. The wound is limited to skin breakdown. There is no tunneling or undermining noted. There is a none present amount of drainage noted. The wound margin is flat and intact. There is medium (34-66%) red granulation within the wound bed. There is a medium (34-66%) amount of necrotic tissue within the wound bed including Adherent Slough. The periwound skin appearance did not exhibit: Callus, Crepitus, Excoriation, Fluctuance, Friable, Induration, Localized Edema, Rash, Scarring, Dry/Scaly, Maceration, Moist, Atrophie Blanche, Cyanosis, Ecchymosis, Hemosiderin Staining, Mottled, Pallor, Rubor, Erythema. The periwound has tenderness on palpation. Wound #8 status is Open. Original cause of wound was Gradually Appeared. The wound is located on the Right Toe Fourth. The wound measures 0cm length x 0cm width x 0cm depth; 0cm^2 area and 0cm^3 volume. The wound is limited to skin breakdown. There is no tunneling or undermining noted. The wound margin is flat and intact. There is no granulation within the wound bed. There is no necrotic tissue within the wound bed. The periwound skin appearance did not exhibit: Callus, Crepitus, Excoriation, Fluctuance, Friable, Induration, Localized Edema, Rash, Scarring, Dry/Scaly, Maceration, Moist, Atrophie Blanche, Cyanosis, Ecchymosis, Hemosiderin Staining, Mottled, Pallor, Rubor, Erythema. 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 This should be done daily and all questions regarding this. She will come back to see me next week  Plan Wound Cleansing: Wound #1 Right Calcaneous: [name], [name] ([id number]) Cleanse wound with mild soap and water May Shower, gently pat wound dry prior to applying new dressing. Wound #6 Right,Posterior Lower Leg: Cleanse wound with mild soap and water May Shower, gently pat wound dry prior to applying new dressing. Wound #7 Right,Anterior Lower Leg: Cleanse wound with mild soap and water May Shower, gently pat wound dry prior to applying new dressing. Anesthetic: Wound #1 Right Calcaneous: Topical Lidocaine 4% cream applied to wound bed prior to debridement Wound #6 Right,Posterior Lower Leg: Topical Lidocaine 4% cream applied to wound bed prior to debridement Wound #7 Right,Anterior Lower Leg: Topical Lidocaine 4% cream applied to wound bed prior to debridement Primary Wound Dressing: Wound #1 Right Calcaneous: Medihoney gel Wound #6 Right,Posterior Lower Leg: Medihoney gel Wound #7 Right,Anterior Lower Leg: Medihoney gel Secondary Dressing: Wound #1 Right Calcaneous: ABD pad Gauze and Kerlix/Conform Wound #6 Right,Posterior Lower Leg: ABD pad Gauze and Kerlix/Conform Wound #7 Right,Anterior Lower Leg: ABD pad Gauze and Kerlix/Conform Dressing Change Frequency: Wound #1 Right Calcaneous: Change dressing every other day. Wound #6 Right,Posterior Lower Leg: Change dressing every other day. Wound #7 Right,Anterior Lower Leg: Change dressing every other day. Follow-up Appointments: Wound #1 Right Calcaneous: Return Appointment in 2  weeks. Wound #6 Right,Posterior Lower Leg: Return Appointment in 2 weeks. Wound #7 Right,Anterior Lower Leg: Return Appointment in 2 weeks. Home Health: Wound #1 Right Calcaneous: Continue Home Health Visits - [facility] Nurse may visit PRN to address patient s wound care needs. FACE TO FACE ENCOUNTER: MEDICARE and MEDICAID PATIENTS: I certify that this patient is under [name], [name] ([id number]) my care and that I had a face-to-face encounter that meets the physician face-to-face encounter requirements with this patient on this date. The encounter with the patient was in whole or in part for the following MEDICAL CONDITION: (primary reason for Home Healthcare) MEDICAL NECESSITY: I certify, that based on my findings, NURSING services are a medically necessary home health service. HOME BOUND STATUS: I certify that my clinical findings support that this patient is homebound (i.e., Due to illness or injury, pt requires aid of supportive devices such as crutches, cane, wheelchairs, walkers, the use of special transportation or the assistance of another person to leave their place of residence. There is a normal inability to leave the home and doing so requires considerable and taxing effort. Other absences are for medical reasons / religious services and are infrequent or of short duration when for other reasons). If current dressing causes regression in wound condition, may D/C ordered dressing product/s and apply Normal Saline Moist Dressing daily until next Wound Healing Center / Other MD appointment. Notify Wound Healing Center of regression in wound condition at [phone]. Please direct any NON-WOUND related issues/requests for orders to patient's Primary Care Physician Wound #6 Right,Posterior Lower Leg: Continue [facility] Health Visits - [facility] Nurse may visit PRN to address patient s wound care needs. FACE TO FACE ENCOUNTER: MEDICARE and MEDICAID PATIENTS: I  certify that this patient is under my care and that I had a face-to-face encounter that meets the physician face-to-face encounter requirements with this patient on this date. The encounter with the patient was in whole or in part for the following MEDICAL CONDITION: (primary reason for Home Healthcare) MEDICAL NECESSITY: I certify, that based on my findings, NURSING services are a medically necessary home health service. HOME BOUND STATUS: I certify that my clinical findings support that this patient is homebound (  i.e., Due to illness or injury, pt requires aid of supportive devices such as crutches, cane, wheelchairs, walkers, the use of special transportation or the assistance of another person to leave their place of residence. There is a normal inability to leave the home and doing so requires considerable and taxing effort. Other absences are for medical reasons / religious services and are infrequent or of short duration when for other reasons). If current dressing causes regression in wound condition, may D/C ordered dressing product/s and apply Normal Saline Moist Dressing daily until next Wound Healing Center / Other MD appointment. Notify Wound Healing Center of regression in wound condition at [phone]. Please direct any NON-WOUND related issues/requests for orders to patient's Primary Care Physician Wound #7 Right,Anterior Lower Leg: Continue Home Health Visits - [facility] Nurse may visit PRN to address patient s wound care needs. FACE TO FACE ENCOUNTER: MEDICARE and MEDICAID PATIENTS: I certify that this patient is under my care and that I had a face-to-face encounter that meets the physician face-to-face encounter requirements with this patient on this date. The encounter with the patient was in whole or in part for the following MEDICAL CONDITION: (primary reason for Home Healthcare) MEDICAL NECESSITY: I certify, that based on my findings, NURSING services are a  medically necessary home health service. HOME BOUND STATUS: I certify that my clinical findings support that this patient is homebound (i.e., Due to illness or injury, pt requires aid of supportive devices such as crutches, cane, wheelchairs, walkers, the use of special transportation or the assistance of another person to leave their place of residence. There is a normal inability to leave the home and doing so requires considerable and taxing effort. Other absences are for medical reasons / religious services and are infrequent or of short duration when for other reasons). If current dressing causes regression in wound condition, may D/C ordered dressing product/s and apply Normal Saline Moist Dressing daily until next Wound Healing Center / Other MD appointment. Notify Wound Healing Center of regression in wound condition at [phone]. Please direct any NON-WOUND related issues/requests for orders to patient's Primary Care Physician [place], [name] ([id number]) I have discussed with the patient's daughter-in-law who is the caregiver to wash with soap and water and then apply Medihoney sparingly over this before doing the dressing. This should be done daily and all questions regarding this. She will come back to see me next week or in 2 weeks time if she is doing well. Electronic Signature(s) Signed: 08/05/2015 10:50:24 AM By: Evlyn Kanner MD, FACS Entered By: Evlyn Kanner on 08/05/2015 10:50:24 [name] ([id number]) -------------------------------------------------------------------------------- SuperBill Details Patient Name: [name] Date of Service: 08/05/2015 Medical Record Number: [id number] Patient Account Number: [id number] Date of Birth/Sex: 1920/12/06 (81 y.o. 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 L97.812 Non-pressure chronic ulcer of other part of right lower leg with fat layer exposed Disruption of external operation (surgical) wound, not elsewhere classified, initial T81.31XA encounter Facility Procedures CPT4 Code: [id number] Description: 36644 - WOUND CARE VISIT-LEV 4 EST PT Modifier: Quantity: 1 Physician Procedures CPT4: Description Modifier Quantity Code [id number] -  WC PHYS LEVEL 3 - EST PT 1 ICD-10 Description Diagnosis E11.621 Type 2 diabetes mellitus with foot ulcer I70.235 Atherosclerosis of native arteries of right leg with ulceration of other part of  foot L97.812 Non-pressure chronic ulcer of other part of right lower leg with fat layer exposed 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

## 2015-08-06 NOTE — Progress Notes (Signed)
[name], [name] ([id number]) Visit Report for 08/05/2015 Arrival Information Details Patient Name: [name], KEEVEN Date of Service: 08/05/2015 10:00 AM Medical Record Number: [id number] Patient Account Number: [id number] Date of Birth/Sex: [birth year] (81 y.o. Female) Treating RN: [name] Primary Care Physician: [name] Other Clinician: Referring Physician: Dorothey [name] Treating Physician/Extender: [name] in Treatment: 25 Visit Information History Since Last Visit Added or deleted any medications: No Patient Arrived: Wheel Chair Any new allergies or adverse reactions: No Arrival Time: 10:04 Had a fall or experienced change in No activities of daily living that may affect Accompanied By: dtr risk of falls: Transfer Assistance: None Signs or symptoms of abuse/neglect since last No Patient Identification Verified: Yes visito Secondary Verification Process Yes Hospitalized since last visit: No Completed: Pain Present Now: No Patient Has Alerts: Yes Patient Alerts: DMII Electronic Signature(s) Signed: 08/05/2015 5:19:50 PM By: [name] Entered By: [name] on 08/05/2015 10:08:12 [name] ([id number]) -------------------------------------------------------------------------------- Clinic Level of Care Assessment Details Patient Name: [name] Date of Service: 08/05/2015 10:00 AM Medical Record Number: [id number] Patient Account Number: [id number] Date of Birth/Sex: 05-24-1921 (81 y.o. Female) Treating RN: [name] Primary Care Physician: [name], [name] Other Clinician: Referring Physician: Terance [name], [name] Treating Physician/Extender: [name] in Treatment: 25 Clinic Level of Care Assessment Items TOOL 4 Quantity Score []  - Use when only an EandM is performed on FOLLOW-UP visit 0 ASSESSMENTS - Nursing Assessment / Reassessment X - Reassessment of Co-morbidities (includes updates in patient status) 1 10 X -  Reassessment of Adherence to Treatment Plan 1 5 ASSESSMENTS - Wound and Skin Assessment / Reassessment []  - Simple Wound Assessment / Reassessment - one wound 0 X - Complex Wound Assessment / Reassessment - multiple wounds 3 5 []  - Dermatologic / Skin Assessment (not related to wound area) 0 ASSESSMENTS - Focused Assessment []  - Circumferential Edema Measurements - multi extremities 0 []  - Nutritional Assessment / Counseling / Intervention 0 X - Lower Extremity Assessment (monofilament, tuning fork, pulses) 1 5 []  - Peripheral Arterial Disease Assessment (using hand held doppler) 0 ASSESSMENTS - Ostomy and/or Continence Assessment and Care []  - Incontinence Assessment and Management 0 []  - Ostomy Care Assessment and Management (repouching, etc.) 0 PROCESS - Coordination of Care X - Simple Patient / Family Education for ongoing care 1 15 []  - Complex (extensive) Patient / Family Education for ongoing care 0 []  - Staff obtains Chiropractor, Records, Test Results / Process Orders 0 []  - Staff telephones HHA, Nursing Homes / Clarify orders / etc 0 []  - Routine Transfer to another Facility (non-emergent condition) 0 [name] ([id number]) []  - Routine Hospital Admission (non-emergent condition) 0 []  - New Admissions / Manufacturing engineer / Ordering NPWT, Apligraf, etc. 0 []  - Emergency Hospital Admission (emergent condition) 0 X - Simple Discharge Coordination 1 10 []  - Complex (extensive) Discharge Coordination 0 PROCESS - Special Needs []  - Pediatric / Minor Patient Management 0 []  - Isolation Patient Management 0 []  - Hearing / Language / Visual special needs 0 []  - Assessment of Community assistance (transportation, D/C planning, etc.) 0 []  - Additional assistance / Altered mentation 0 []  - Support Surface(s) Assessment (bed, cushion, seat, etc.) 0 INTERVENTIONS - Wound Cleansing / Measurement []  - Simple Wound Cleansing - one wound 0 X - Complex Wound Cleansing - multiple  wounds 3 5 X - Wound Imaging (photographs - any number of wounds) 1 5 []  - Wound Tracing (instead of photographs) 0 []  - Simple Wound Measurement - one  wound 0 X - Complex Wound Measurement - multiple wounds 3 5 INTERVENTIONS - Wound Dressings X - Small Wound Dressing one or multiple wounds 3 10 []  - Medium Wound Dressing one or multiple wounds 0 []  - Large Wound Dressing one or multiple wounds 0 []  - Application of Medications - topical 0 []  - Application of Medications - injection 0 INTERVENTIONS - Miscellaneous []  - External ear exam 0 [name], [name] ([id number]) []  - Specimen Collection (cultures, biopsies, blood, body fluids, etc.) 0 []  - Specimen(s) / Culture(s) sent or taken to Lab for analysis 0 []  - Patient Transfer (multiple staff / Michiel SitesHoyer Lift / Similar devices) 0 []  - Simple Staple / Suture removal (25 or less) 0 []  - Complex Staple / Suture removal (26 or more) 0 []  - Hypo / Hyperglycemic Management (close monitor of Blood Glucose) 0 []  - Ankle / Brachial Index (ABI) - do not check if billed separately 0 X - Vital Signs 1 5 Has the patient been seen at the hospital within the last three years: Yes Total Score: 130 Level Of Care: New/Established - Level 4 Electronic Signature(s) Signed: 08/05/2015 5:19:50 PM By: [name] Sitesorthy, [name] Entered By: [name] Sitesorthy, [name] on 08/05/2015 10:38:46 [name] NationsSILETZKY, [name] ([id number]) -------------------------------------------------------------------------------- Encounter Discharge Information Details Patient Name: [name] NationsSILETZKY, [name] Date of Service: 08/05/2015 10:00 AM Medical Record Number: [id number] Patient Account Number: [id number] Date of Birth/Sex: 01/11/1921 (81 y.o. Female) Treating RN: [name] Sitesorthy, [name] Primary Care Physician: [name] BasemanBRONSTEIN, [name] Other Clinician: Referring Physician: Dorothey BasemanBRONSTEIN, [name] Treating Physician/Extender: [name] ReBritto, Errol Weeks in Treatment: 25 Encounter Discharge Information Items Discharge Pain Level: 0 Discharge  Condition: Stable Ambulatory Status: Wheelchair Discharge Destination: Home Transportation: Private Auto Accompanied By: dtr Schedule Follow-up Appointment: Yes Medication Reconciliation completed and provided to Patient/Care No Semya Klinke: Provided on Clinical Summary of Care: 08/05/2015 Form Type Recipient Paper Patient HS Electronic Signature(s) Signed: 08/05/2015 11:47:33 AM By: [name] Sitesorthy, [name] Previous Signature: 08/05/2015 10:52:46 AM Version By: Gwenlyn PerkingMoore, Shelia Entered By: [name] Sitesorthy, [name] on 08/05/2015 11:47:33 [name] NationsSILETZKY, [name] ([id number]) -------------------------------------------------------------------------------- Lower Extremity Assessment Details Patient Name: [name] NationsSILETZKY, [name] Date of Service: 08/05/2015 10:00 AM Medical Record Number: [id number] Patient Account Number: [id number] Date of Birth/Sex: 05/18/1921 (81 y.o. Female) Treating RN: [name] Sitesorthy, [name] Primary Care Physician: [name] BasemanBRONSTEIN, [name] Other Clinician: Referring Physician: Dorothey BasemanBRONSTEIN, [name] Treating Physician/Extender: [name] ReBritto, Errol Weeks in Treatment: 25 Edema Assessment Assessed: [[name]: No] [Right: No] Edema: [[name]: Ye] [Right: s] Vascular Assessment Pulses: Posterior Tibial Palpable: [Right:No] Doppler: [Right:Monophasic] Dorsalis Pedis Palpable: [Right:No] Doppler: [Right:Monophasic] Extremity colors, hair growth, and conditions: Extremity Color: [Right:Normal] Hair Growth on Extremity: [Right:Yes] Temperature of Extremity: [Right:Warm] Capillary Refill: [Right:> 3 seconds] Toe Nail Assessment [name]: Right: Thick: Yes Discolored: Yes Deformed: No Improper Length and Hygiene: No Electronic Signature(s) Signed: 08/05/2015 5:19:50 PM By: [name] Sitesorthy, [name] Entered By: [name] Sitesorthy, [name] on 08/05/2015 10:22:30 [name] NationsSILETZKY, [name] ([id number]) -------------------------------------------------------------------------------- Multi Wound Chart Details Patient Name: [name] NationsSILETZKY, [name] Date of Service: 08/05/2015 10:00  AM Medical Record Number: [id number] Patient Account Number: [id number] Date of Birth/Sex: 03/17/1921 (81 y.o. Female) Treating RN: [name] Sitesorthy, [name] Primary Care Physician: [name] BasemanBRONSTEIN, [name] Other Clinician: Referring Physician: Terance HartBRONSTEIN, [name] Treating Physician/Extender: [name] ReBritto, Errol Weeks in Treatment: 25 Vital Signs Height(in): 62 Pulse(bpm): 56 Weight(lbs): 155 Blood Pressure 118/50 (mmHg): Body Mass Index(BMI): 28 Temperature(F): 97.8 Respiratory Rate 18 (breaths/min): Photos: [1:No Photos] [6:No Photos] [7:No Photos] Wound Location: [1:Right Calcaneous] [6:Right Lower Leg - Posterior] [7:Right Lower Leg - Anterior] Wounding Event: [1:Gradually Appeared] [6:Blister] [7:Gradually Appeared] 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] Comorbid History: [1:Arrhythmia,  Deep Vein Thrombosis, Peripheral Arterial Disease, Type II Arterial Disease, Type II Arterial Disease, Type II Diabetes] [6:Arrhythmia, Deep Vein Thrombosis, Peripheral Diabetes] [7:Arrhythmia, Deep Vein Thrombosis,  Peripheral Diabetes] Date Acquired: [1:11/04/2014] [6:06/12/2015] [7:06/18/2015] Weeks of Treatment: [1:25] [6:7] [7:6] Wound Status: [1:Open] [6:Open] [7:Open] Measurements L x W x D 0.4x0.7x0.1 [6:1.6x1.7x0.1] [7:0.2x0.3x0.1] (cm) Area (cm) : [1:0.22] [6:2.136] [7:0.047] Volume (cm) : [1:0.022] [6:0.214] [7:0.005] % Reduction in Area: [1:96.00%] [6:-172.10%] [7:80.10%] % Reduction in Volume: 96.00% [6:-170.90%] [7:89.40%] Classification: [1:Full Thickness Without Exposed Support Structures] [6:Full Thickness Without Exposed Support Structures] [7:Grade 1] HBO Classification: [1:Grade 1] [6:Grade 1] [7:N/A] Exudate Amount: [1:Medium] [6:None Present] [7:None Present] Exudate Type: [1:Serosanguineous] [6:N/A] [7:N/A] Exudate Color: [1:red, brown] [6:N/A] [7:N/A] Wound Margin: [1:Distinct, outline attached Flat and  Intact] [7:Flat and Intact] Granulation Amount: [1:Medium (34-66%)] [6:Medium (34-66%)] [7:Medium (34-66%)] Granulation Quality: [1:Pink, Pale] [6:Pink] [7:Red] Necrotic Amount: [1:Medium (34-66%)] [6:Medium (34-66%)] [7:Medium (34-66%)] Exposed Structures: Fascia: No Fascia: No Fascia: No Fat: No Fat: No Fat: No Tendon: No Tendon: No Tendon: No Muscle: No Muscle: No Muscle: No Joint: No Joint: No Joint: No Bone: No Bone: No Bone: No Limited to Skin Limited to Skin Limited to Skin Breakdown Breakdown Breakdown Epithelialization: Small (1-33%) None None Periwound Skin Texture: Edema: No Edema: No Edema: No Excoriation: No Excoriation: No Excoriation: No Induration: No Induration: No Induration: No Callus: No Callus: No Callus: No Crepitus: No Crepitus: No Crepitus: No Fluctuance: No Fluctuance: No Fluctuance: No Friable: No Friable: No Friable: No Rash: No Rash: No Rash: No Scarring: No Scarring: No Scarring: No Periwound Skin Dry/Scaly: Yes Maceration: No Maceration: No Moisture: Maceration: No Moist: No Moist: No Moist: No Dry/Scaly: No Dry/Scaly: No Periwound Skin Color: Atrophie Blanche: No Atrophie Blanche: No Atrophie Blanche: No Cyanosis: No Cyanosis: No Cyanosis: No Ecchymosis: No Ecchymosis: No Ecchymosis: No Erythema: No Erythema: No Erythema: No Hemosiderin Staining: No Hemosiderin Staining: No Hemosiderin Staining: No Mottled: No Mottled: No Mottled: No Pallor: No Pallor: No Pallor: No Rubor: No Rubor: No Rubor: No Tenderness on No Yes Yes Palpation: Wound Preparation: Ulcer Cleansing: Ulcer Cleansing: Ulcer Cleansing: Rinsed/Irrigated with Rinsed/Irrigated with Rinsed/Irrigated with Saline Saline Saline Topical Anesthetic Topical Anesthetic Topical Anesthetic Applied: Other: lidocaine Applied: Other: lidocaine Applied: Other: lidocaine 4% 4% 4% Wound Number: 8 N/A N/A Photos: No Photos N/A N/A Wound Location:  Right Toe Fourth N/A N/A Wounding Event: Gradually Appeared N/A N/A Primary Etiology: Diabetic Wound/Ulcer of N/A N/A the Lower Extremity Comorbid History: Arrhythmia, Deep Vein N/A N/A Thrombosis, Peripheral Arterial Disease, Type II Diabetes Date Acquired: 06/09/2015 N/A N/A [name], [name] ([id number]) Weeks of Treatment: 6 N/A N/A Wound Status: Open N/A N/A Measurements L x W x D 0x0x0 N/A N/A (cm) Area (cm) : 0 N/A N/A Volume (cm) : 0 N/A N/A % Reduction in Area: 100.00% N/A N/A % Reduction in Volume: 100.00% N/A N/A Classification: Grade 1 N/A N/A HBO Classification: N/A N/A N/A Exudate Amount: N/A N/A N/A Exudate Type: N/A N/A N/A Exudate Color: N/A N/A N/A Wound Margin: Flat and Intact N/A N/A Granulation Amount: None Present (0%) N/A N/A Granulation Quality: N/A N/A N/A Necrotic Amount: None Present (0%) N/A N/A Exposed Structures: Fascia: No N/A N/A Fat: No Tendon: No Muscle: No Joint: No Bone: No Limited to Skin Breakdown Epithelialization: Large (67-100%) N/A N/A Periwound Skin Texture: Edema: No N/A N/A Excoriation: No Induration: No Callus: No Crepitus: No Fluctuance: No Friable: No Rash: No Scarring: No Periwound Skin Maceration: No N/A N/A Moisture: Moist: No Dry/Scaly: No  Periwound Skin Color: Atrophie Blanche: No N/A N/A Cyanosis: No Ecchymosis: No Erythema: No Hemosiderin Staining: No Mottled: No Pallor: No Rubor: No Tenderness on No N/A N/A Palpation: Wound Preparation: Ulcer Cleansing: N/A N/A Rinsed/Irrigated with GRAW, Willye ([id number]) Saline Topical Anesthetic Applied: None Treatment Notes Electronic Signature(s) Signed: 08/05/2015 5:19:50 PM By: [name] Entered By: [name] on 08/05/2015 10:27:34 [name] ([id number]) -------------------------------------------------------------------------------- Multi-Disciplinary Care Plan Details Patient Name: [name] Date of Service: 08/05/2015  10:00 AM Medical Record Number: [id number] Patient Account Number: [id number] Date of Birth/Sex: 05/31/1921 (81 y.o. Female) Treating RN: [name] Primary Care Physician: [name] Other Clinician: Referring Physician: Dorothey [name] Treating Physician/Extender: [name] in Treatment: 25 Active Inactive Abuse / Safety / Falls / Self Care Management Nursing Diagnoses: Impaired physical mobility Potential for falls Goals: Patient will remain injury free Date Initiated: 02/11/2015 Goal Status: Active Patient/caregiver will verbalize understanding of skin care regimen Date Initiated: 02/11/2015 Goal Status: Active Patient/caregiver will verbalize/demonstrate measures taken to prevent injury and/or falls Date Initiated: 02/11/2015 Goal Status: Active Patient/caregiver will verbalize/demonstrate understanding of what to do in case of emergency Date Initiated: 02/11/2015 Goal Status: Active Interventions: Assess fall risk on admission and as needed Provide education on fall prevention Provide education on safe transfers Treatment Activities: Patient referred to home care : 08/05/2015 Notes: Nutrition Nursing Diagnoses: Imbalanced nutrition Potential for alteratiion in Nutrition/Potential for imbalanced nutrition Insley, Dietrich ([id number]) Goals: Patient/caregiver verbalizes understanding of need to maintain therapeutic glucose control per primary care physician Date Initiated: 02/11/2015 Goal Status: Active Patient/caregiver will maintain therapeutic glucose control Date Initiated: 02/11/2015 Goal Status: Active Interventions: Assess HgA1c results as ordered upon admission and as needed Provide education on elevated blood sugars and impact on wound healing Provide education on nutrition Treatment Activities: Education provided on Nutrition : 04/22/2015 Obtain HgA1c : 08/05/2015 Notes: Wound/Skin Impairment Nursing Diagnoses: Impaired tissue  integrity Knowledge deficit related to ulceration/compromised skin integrity Goals: Patient/caregiver will verbalize understanding of skin care regimen Date Initiated: 02/11/2015 Goal Status: Active Ulcer/skin breakdown will heal within 14 weeks Date Initiated: 02/11/2015 Goal Status: Active Interventions: Assess patient/caregiver ability to obtain necessary supplies Assess patient/caregiver ability to perform ulcer/skin care regimen upon admission and as needed Assess ulceration(s) every visit Provide education on ulcer and skin care Treatment Activities: Skin care regimen initiated : 08/05/2015 Topical wound management initiated : 08/05/2015 Notes: [name], [name] ([id number]) Electronic Signature(s) Signed: 08/05/2015 5:19:50 PM By: [name] Entered By: [name] on 08/05/2015 10:25:28 [name] ([id number]) -------------------------------------------------------------------------------- Patient/Caregiver Education Details Patient Name: [name] Date of Service: 08/05/2015 10:00 AM Medical Record Number: [id number] Patient Account Number: [id number] Date of Birth/Gender: 1921/03/24 (81 y.o. Female) Treating RN: [name] Primary Care Physician: [name] Other Clinician: Referring Physician: Dorothey [name] Treating Physician/Extender: [name] in Treatment: 25 Education Assessment Education Provided To: Patient and Caregiver Education Topics Provided Wound/Skin Impairment: Handouts: Other: wound care as ordered and wcc if needed before 2 weeks Methods: Demonstration, Explain/Verbal Responses: State content correctly Electronic Signature(s) Signed: 08/05/2015 11:48:06 AM By: [name] Entered By: [name] on 08/05/2015 11:48:06 [name] ([id number]) -------------------------------------------------------------------------------- Wound Assessment Details Patient Name: [name] Date of Service: 08/05/2015  10:00 AM Medical Record Number: [id number] Patient Account Number: [id number] Date of Birth/Sex: 1921-07-11 (81 y.o. Female) Treating RN: [name] Primary Care Physician: [name] Other Clinician: Referring Physician: Dorothey [name] Treating Physician/Extender: [name] in Treatment: 25 Wound Status Wound Number: 1 Primary Arterial Insufficiency Ulcer Etiology: Wound Location: Right Calcaneous Wound  Open Wounding Event: Gradually Appeared Status: Date Acquired: 11/04/2014 Comorbid Arrhythmia, Deep Vein Thrombosis, Weeks Of Treatment: 25 History: Peripheral Arterial Disease, Type II Clustered Wound: No Diabetes Photos Photo Uploaded By: [name] on 08/05/2015 11:58:47 Wound Measurements Length: (cm) 0.4 Width: (cm) 0.7 Depth: (cm) 0.1 Area: (cm) 0.22 Volume: (cm) 0.022 % Reduction in Area: 96% % Reduction in Volume: 96% Epithelialization: Small (1-33%) Tunneling: No Undermining: No Wound Description Full Thickness Without Exposed Classification: Support Structures Diabetic Severity Grade 1 (Wagner): Wound Margin: Distinct, outline attached Exudate Amount: Medium Exudate Type: Serosanguineous Exudate Color: red, brown Foul Odor After Cleansing: No Wound Bed Granulation Amount: Medium (34-66%) Exposed Structure Stenseth, Judianne ([id number]) Granulation Quality: Pink, Pale Fascia Exposed: No Necrotic Amount: Medium (34-66%) Fat Layer Exposed: No Tendon Exposed: No Muscle Exposed: No Joint Exposed: No Bone Exposed: No Limited to Skin Breakdown Periwound Skin Texture Texture Color No Abnormalities Noted: No No Abnormalities Noted: No Callus: No Atrophie Blanche: No Crepitus: No Cyanosis: No Excoriation: No Ecchymosis: No Fluctuance: No Erythema: No Friable: No Hemosiderin Staining: No Induration: No Mottled: No Localized Edema: No Pallor: No Rash: No Rubor: No Scarring: No Moisture No Abnormalities Noted: No Dry  / Scaly: Yes Maceration: No Moist: No Wound Preparation Ulcer Cleansing: Rinsed/Irrigated with Saline Topical Anesthetic Applied: Other: lidocaine 4%, Treatment Notes Wound #1 (Right Calcaneous) 1. Cleansed with: Clean wound with Normal Saline 2. Anesthetic Topical Lidocaine 4% cream to wound bed prior to debridement 4. Dressing Applied: Medihoney Gel 5. Secondary Dressing Applied Guaze, ABD and kerlix/Conform 7. Secured with Secretary/administrator) Signed: 08/05/2015 5:19:50 PM By: [name] Entered By: [name] on 08/05/2015 10:23:30 [name] ([id number]) [name], [name] ([id number]) -------------------------------------------------------------------------------- Wound Assessment Details Patient Name: [name] Date of Service: 08/05/2015 10:00 AM Medical Record Number: [id number] Patient Account Number: [id number] Date of Birth/Sex: 09/07/1921 (81 y.o. Female) Treating RN: [name] Primary Care Physician: [name] Other Clinician: Referring Physician: Terance [name], [name] Treating Physician/Extender: [name] in Treatment: 25 Wound Status Wound Number: 6 Primary Arterial Insufficiency Ulcer Etiology: Wound Location: Right Lower Leg - Posterior Wound Open Wounding Event: Blister Status: Date Acquired: 06/12/2015 Comorbid Arrhythmia, Deep Vein Thrombosis, Weeks Of Treatment: 7 History: Peripheral Arterial Disease, Type II Clustered Wound: No Diabetes Photos Photo Uploaded By: [name] on 08/05/2015 11:58:48 Wound Measurements Length: (cm) 1.6 Width: (cm) 1.7 Depth: (cm) 0.1 Area: (cm) 2.136 Volume: (cm) 0.214 % Reduction in Area: -172.1% % Reduction in Volume: -170.9% Epithelialization: None Tunneling: No Undermining: No Wound Description Full Thickness Without Foul Odor Aft Classification: Exposed Support Structures Diabetic Severity Grade 1 (Wagner): Wound Margin: Flat and Intact Exudate  Amount: None Present er Cleansing: No Wound Bed Granulation Amount: Medium (34-66%) Exposed Structure Granulation Quality: Pink Fascia Exposed: No Necrotic Amount: Medium (34-66%) Fat Layer Exposed: No Villeda, Sheretha ([id number]) Necrotic Quality: Adherent Slough Tendon Exposed: No Muscle Exposed: No Joint Exposed: No Bone Exposed: No Limited to Skin Breakdown Periwound Skin Texture Texture Color No Abnormalities Noted: No No Abnormalities Noted: No Callus: No Atrophie Blanche: No Crepitus: No Cyanosis: No Excoriation: No Ecchymosis: No Fluctuance: No Erythema: No Friable: No Hemosiderin Staining: No Induration: No Mottled: No Localized Edema: No Pallor: No Rash: No Rubor: No Scarring: No Temperature / Pain Moisture Tenderness on Palpation: Yes No Abnormalities Noted: No Dry / Scaly: No Maceration: No Moist: No Wound Preparation Ulcer Cleansing: Rinsed/Irrigated with Saline Topical Anesthetic Applied: Other: lidocaine 4%, Treatment Notes Wound #6 (Right, Posterior Lower Leg) 1. Cleansed with: Clean wound with  Normal Saline 2. Anesthetic Topical Lidocaine 4% cream to wound bed prior to debridement 4. Dressing Applied: Medihoney Gel 5. Secondary Dressing Applied Guaze, ABD and kerlix/Conform 7. Secured with Secretary/administrator) Signed: 08/05/2015 5:19:50 PM By: [name] Entered By: [name] on 08/05/2015 10:24:40 [name] ([id number]) -------------------------------------------------------------------------------- Wound Assessment Details Patient Name: [name] Date of Service: 08/05/2015 10:00 AM Medical Record Number: [id number] Patient Account Number: [id number] Date of Birth/Sex: 1921/05/28 (81 y.o. Female) Treating RN: [name] Primary Care Physician: [name] Other Clinician: Referring Physician: Terance [name], [name] Treating Physician/Extender: [name] in Treatment: 25 Wound Status Wound  Number: 7 Primary Diabetic Wound/Ulcer of the Lower Etiology: Extremity Wound Location: Right Lower Leg - Anterior Wound Open Wounding Event: Gradually Appeared Status: Date Acquired: 06/18/2015 Comorbid Arrhythmia, Deep Vein Thrombosis, Weeks Of Treatment: 6 History: Peripheral Arterial Disease, Type II Clustered Wound: No Diabetes Photos Photo Uploaded By: [name] on 08/05/2015 11:59:52 Wound Measurements Length: (cm) 0.2 Width: (cm) 0.3 Depth: (cm) 0.1 Area: (cm) 0.047 Volume: (cm) 0.005 % Reduction in Area: 80.1% % Reduction in Volume: 89.4% Epithelialization: None Tunneling: No Undermining: No Wound Description Classification: Grade 1 Wound Margin: Flat and Intact Exudate Amount: None Present Wound Bed Granulation Amount: Medium (34-66%) Exposed Structure Granulation Quality: Red Fascia Exposed: No Necrotic Amount: Medium (34-66%) Fat Layer Exposed: No Necrotic Quality: Adherent Slough Tendon Exposed: No Muscle Exposed: No Joint Exposed: No Bruski, Nekayla ([id number]) Bone Exposed: No Limited to Skin Breakdown Periwound Skin Texture Texture Color No Abnormalities Noted: No No Abnormalities Noted: No Callus: No Atrophie Blanche: No Crepitus: No Cyanosis: No Excoriation: No Ecchymosis: No Fluctuance: No Erythema: No Friable: No Hemosiderin Staining: No Induration: No Mottled: No Localized Edema: No Pallor: No Rash: No Rubor: No Scarring: No Temperature / Pain Moisture Tenderness on Palpation: Yes No Abnormalities Noted: No Dry / Scaly: No Maceration: No Moist: No Wound Preparation Ulcer Cleansing: Rinsed/Irrigated with Saline Topical Anesthetic Applied: Other: lidocaine 4%, Treatment Notes Wound #7 (Right, Anterior Lower Leg) 1. Cleansed with: Clean wound with Normal Saline 2. Anesthetic Topical Lidocaine 4% cream to wound bed prior to debridement 4. Dressing Applied: Medihoney Gel 5. Secondary Dressing Applied Guaze, ABD  and kerlix/Conform 7. Secured with Secretary/administrator) Signed: 08/05/2015 5:19:50 PM By: [name] Entered By: [name] on 08/05/2015 10:25:18 [name] ([id number]) -------------------------------------------------------------------------------- Wound Assessment Details Patient Name: [name] Date of Service: 08/05/2015 10:00 AM Medical Record Number: [id number] Patient Account Number: [id number] Date of Birth/Sex: 1920-11-29 (81 y.o. Female) Treating RN: [name] Primary Care Physician: [name] Other Clinician: Referring Physician: Terance [name], [name] Treating Physician/Extender: [name] in Treatment: 25 Wound Status Wound Number: 8 Primary Diabetic Wound/Ulcer of the Lower Etiology: Extremity Wound Location: Right Toe Fourth Wound Open Wounding Event: Gradually Appeared Status: Date Acquired: 06/09/2015 Comorbid Arrhythmia, Deep Vein Thrombosis, Weeks Of Treatment: 6 History: Peripheral Arterial Disease, Type II Clustered Wound: No Diabetes Photos Photo Uploaded By: [name] on 08/05/2015 11:59:53 Wound Measurements Length: (cm) 0 % Reduction i Width: (cm) 0 % Reduction i Depth: (cm) 0 Epithelializa Area: (cm) 0 Tunneling: Volume: (cm) 0 Undermining: n Area: 100% n Volume: 100% tion: Large (67-100%) No No Wound Description Classification: Grade 1 Wound Margin: Flat and Intact Wound Bed Granulation Amount: None Present (0%) Exposed Structure Necrotic Amount: None Present (0%) Fascia Exposed: No Fat Layer Exposed: No Tendon Exposed: No Muscle Exposed: No Joint Exposed: No Bone Exposed: No Spagnoli, Leonie ([id number]) Limited to Skin Breakdown Periwound Skin Texture Texture  Color No Abnormalities Noted: No No Abnormalities Noted: No Callus: No Atrophie Blanche: No Crepitus: No Cyanosis: No Excoriation: No Ecchymosis: No Fluctuance: No Erythema: No Friable: No Hemosiderin Staining:  No Induration: No Mottled: No Localized Edema: No Pallor: No Rash: No Rubor: No Scarring: No Moisture No Abnormalities Noted: No Dry / Scaly: No Maceration: No Moist: No Wound Preparation Ulcer Cleansing: Rinsed/Irrigated with Saline Topical Anesthetic Applied: None Electronic Signature(s) Signed: 08/05/2015 5:19:50 PM By: [name] Entered By: [name] on 08/05/2015 10:24:02 [name] ([id number]) -------------------------------------------------------------------------------- Vitals Details Patient Name: [name] Date of Service: 08/05/2015 10:00 AM Medical Record Number: [id number] Patient Account Number: [id number] Date of Birth/Sex: 22-Sep-1921 (81 y.o. Female) Treating RN: [name] Primary Care Physician: [name] Other Clinician: Referring Physician: Terance [name], [name] Treating Physician/Extender: [name] in Treatment: 25 Vital Signs Time Taken: 10:08 Temperature (F): 97.8 Height (in): 62 Pulse (bpm): 56 Weight (lbs): 155 Respiratory Rate (breaths/min): 18 Body Mass Index (BMI): 28.3 Blood Pressure (mmHg): 118/50 Reference Range: 80 - 120 mg / dl Electronic Signature(s) Signed: 08/05/2015 5:19:50 PM By: [name] Entered By: [name] on 08/05/2015 10:09:51

## 2015-08-19 ENCOUNTER — Encounter: Payer: Medicare Other | Admitting: Surgery

## 2015-08-19 DIAGNOSIS — E11621 Type 2 diabetes mellitus with foot ulcer: Secondary | ICD-10-CM | POA: Diagnosis not present

## 2015-08-20 NOTE — Progress Notes (Signed)
FLORA, PARKS ([id number]) Visit Report for 08/19/2015 Chief Complaint Document Details Patient Name: [name], [name] 08/19/2015 10:45 Date of Service: AM Medical Record [id number] Number: Patient Account Number: [id number] 21-Jun-1921 (81 y.o. Treating RN: Curtis Sites Date of Birth/Sex: Female) Other Clinician: Primary Care Physician: Terance Hart, DAVID Treating Kimbree Casanas Referring Physician: Dorothey Baseman Physician/Extender: Weeks in Treatment: 27 Information Obtained from: Patient Chief Complaint Patient presents to the wound care center for a consult due non healing wound. 81 year old patient with comes with a history of a ulcerated area to the right third toe and the right heel which she's had for about 2 months. Electronic Signature(s) Signed: 08/19/2015 12:00:19 PM By: Evlyn Kanner MD, FACS Entered By: Evlyn Kanner on 08/19/2015 12:00:19 [name] ([id number]) -------------------------------------------------------------------------------- Debridement Details Patient Name: [name], [name] 08/19/2015 10:45 Date of Service: AM Medical Record [id number] Number: Patient Account Number: [id number] [birth year] (81 y.o. Treating RN: Curtis Sites Date of Birth/Sex: Female) Other Clinician: Primary Care Physician: Terance Hart, DAVID Treating Mychele Seyller Referring Physician: Dorothey Baseman Physician/Extender: Tania Ade in Treatment: 27 Debridement Performed for Wound #1 Right Calcaneous Assessment: Performed By: Physician Evlyn Kanner, MD Debridement: Debridement Pre-procedure Yes Verification/Time Out Taken: Start Time: 11:24 Pain Control: Lidocaine 4% Topical Solution Level: Skin/Subcutaneous Tissue Total Area Debrided (L x 0.3 (cm) x 0.6 (cm) = 0.18 (cm) W): Tissue and other Viable, Non-Viable, Exudate, Fibrin/Slough, Skin, Subcutaneous material debrided: Instrument: Forceps Bleeding: None End Time: 11:26 Procedural Pain: 8 Post Procedural Pain:  0 Response to Treatment: Procedure was tolerated well Post Debridement Measurements of Total Wound Length: (cm) 0.3 Width: (cm) 0.6 Depth: (cm) 0.1 Volume: (cm) 0.014 Post Procedure Diagnosis Same as Pre-procedure Electronic Signature(s) Signed: 08/19/2015 12:00:02 PM By: Evlyn Kanner MD, FACS Signed: 08/19/2015 5:17:47 PM By: Curtis Sites Entered By: Evlyn Kanner on 08/19/2015 12:00:01 [name] ([id number]) -------------------------------------------------------------------------------- Debridement Details Patient Name: [name], [name] 08/19/2015 10:45 Date of Service: AM Medical Record [id number] Number: Patient Account Number: [id number] Oct 19, 1920 (81 y.o. Treating RN: Curtis Sites Date of Birth/Sex: Female) Other Clinician: Primary Care Physician: Terance Hart, DAVID Treating Thomas Mabry Referring Physician: Dorothey Baseman Physician/Extender: Tania Ade in Treatment: 27 Debridement Performed for Wound #6 Right,Posterior Lower Leg Assessment: Performed By: Physician Evlyn Kanner, MD Debridement: Debridement Pre-procedure Yes Verification/Time Out Taken: Start Time: 11:26 Pain Control: Lidocaine 4% Topical Solution Level: Skin/Subcutaneous Tissue Total Area Debrided (L x 1.5 (cm) x 0.8 (cm) = 1.2 (cm) W): Tissue and other Viable, Non-Viable, Exudate, Fibrin/Slough, Skin, Subcutaneous material debrided: Instrument: Forceps Bleeding: None End Time: 11:28 Procedural Pain: 10 Post Procedural Pain: 0 Response to Treatment: Procedure was tolerated well Post Debridement Measurements of Total Wound Length: (cm) 1.5 Width: (cm) 0.8 Depth: (cm) 0.2 Volume: (cm) 0.188 Post Procedure Diagnosis Same as Pre-procedure Electronic Signature(s) Signed: 08/19/2015 12:00:11 PM By: Evlyn Kanner MD, FACS Signed: 08/19/2015 5:17:47 PM By: Curtis Sites Entered By: Evlyn Kanner on 08/19/2015 12:00:11 [name]  ([id number]) -------------------------------------------------------------------------------- HPI Details Patient Name: [name], [name] 08/19/2015 10:45 Date of Service: AM Medical Record [id number] Number: Patient Account Number: [id number] 07-21-1921 (81 y.o. Treating RN: Curtis Sites Date of Birth/Sex: Female) Other Clinician: Primary Care Physician: Terance Hart, DAVID Treating Evlyn Kanner Referring Physician: Dorothey Baseman Physician/Extender: Weeks in Treatment: 27 History of Present Illness Location: right medial heel and right third toe Quality: Patient reports experiencing a dull pain to affected area(s). Severity: Patient states wound are getting worse. Duration: Patient has had the wound for > 3 months prior to seeking treatment at the wound center Timing: Pain in wound is Intermittent (comes and  goes Context: The wound appeared gradually over time Modifying Factors: Consults to this date include:vascular surgeon and several recent surgeries. Associated Signs and Symptoms: Patient reports having foul odor and drainage from the left below-knee amputation site. HPI Description: A pleasant 81 year old patient who is known to have diabetes mellitus for several years has recently gone through a series of operations with the vascular surgeon Dr. Gilda Crease. In January and February she had several surgeries on the left lower extremity with an attempt to have limb salvage for gangrenous changes of her forefoot. 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 On 01/07/2015 she also had a right lower extremity distal runoff with a angioplasty of the right anterior tibial artery to maximize her blood flow to the foot. She recently had her staples removed at Dr. Marijean Heath office on 01/31/2015 and at that time a right lower extremity duplex was done which showed patent vessels and a patent stent with distal occluded  posterior tibial artery. Though the duplex was noncritical the recommendations from the PA at the vascular surgery office was that of a angiogram to be done but the patient said she would rather try some bone care before. The patient has received doxycycline and Cipro in the recent past and she takes oral medications for her diabetes. Other than that I reviewed her list of all her medications. No recent hemoglobin A1c has been done and no recent x-rays of the right foot have been done. 02/18/2015 -- x-ray of the right foot was done on 02/11/2015 and it shows no evidence of acute osteomyelitis of the third toe or of the calcaneus. She had gone to the vascular surgery office and they had noted that there is dehiscence of the left part of the amputation site of the below-knee wound and they have asked [place] to kindly take over the care of this. There've been doing dressings for the right heel and right third toe. 02/25/2015 -- we have received notes from the vascular group who saw her last on 02/13/2015 and she was seen by the PA Ms. Cleda Daub. She had recommended that the patient continue to follow with [place] for wound care including management of the left BKA stump, which has had dehiscence of the lateral part. They will consider repeating a arterial duplex or angiogram if the right lower extremity does not heal within a reasonable period of time. 03/18/2015 -- he saw the vascular surgeon Dr. Gilda Crease and he has set her up for an angioplasty sometime BRANDSTETTER, Jennfer ([id number]) in the middle of July. she is doing well otherwise. 04/10/2015 -- the patient had a procedure done on 04/08/2015 and this was a right angioplasty of the dorsalis pedis, anterior tibial artery, first superficial femoral artery in its midportion. There was successful intervention with recanalization and in-line flow to the right foot. 04/22/2015 -- the patient was looking rather pale today and the daughter confirms that  her hemoglobin was down to 6.9. she is being monitored by her PCP. Her Lasix dose was increased and her edema has gone down significantly. 04/29/2015 -- she had vascular studies done and the ABI on the right is 1.3 and the toe pressures were within normal limits. Her hemoglobin is still around 7 and she refuses to take any blood transfusions for religious reasons. Her potassium was normal. 06/10/2015 -- she has a new blister on her right lateral and posterior part of her leg just in the region  where the Kerlix bandages were applied and this may be due to an abrasion. 06/24/2015 -- On her right lower extremity she has developed several more blisters which look like small pustules and the drain informed shallow ulcerations. 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 07/01/2015 -- she has an appointment with the PCP this Friday and her vascular surgeon on Monday. Other than that she is on doxycycline and has finished 1 week of treatment. The pustules she had all over have now resolved. 07/08/2015 -- she saw the vascular surgeon who did studies in the office and found that there is poor circulation in the distal lower right leg and has set her up for an angiogram and possible stenting next Tuesday. 07/22/2015 -- on October 18 she was taken up for a successful revascularization of the anterior tibial vessel on the right side. a PTA and stent placement was done to the right anterior tibial artery. 08/19/2015 -- she has broken out with some linear ulceration in the region of her webspaces of her toes and this is something new. Electronic Signature(s) Signed: 08/19/2015 12:00:51 PM By: Evlyn Kanner MD, FACS Entered By: Evlyn Kanner on 08/19/2015 12:00:51 [name] ([id number]) -------------------------------------------------------------------------------- Physical Exam Details Patient Name: [name], [name] 08/19/2015 10:45 Date of Service: AM Medical Record [id number] Number: Patient Account  Number: [id number] [birth year] (81 y.o. Treating RN: Curtis Sites Date of Birth/Sex: Female) Other Clinician: Primary Care Physician: Terance Hart, DAVID Treating Evlyn Kanner Referring Physician: Terance Hart, DAVID Physician/Extender: Weeks in Treatment: 27 Constitutional . Pulse regular. Respirations normal and unlabored. Afebrile. . Eyes Nonicteric. Reactive to light. Ears, Nose, Mouth, and Throat Lips, teeth, and gums WNL.Marland Kitchen Moist mucosa without lesions . Neck supple and nontender. No palpable supraclavicular or cervical adenopathy. Normal sized without goiter. Respiratory WNL. No retractions.. Cardiovascular Pedal Pulses WNL. No clubbing, cyanosis or edema. Lymphatic No adneopathy. No adenopathy. No adenopathy. Musculoskeletal Adexa without tenderness or enlargement.. Digits and nails w/o clubbing, cyanosis, infection, petechiae, ischemia, or inflammatory conditions.. Integumentary (Hair, Skin) No suspicious lesions. No crepitus or fluctuance. No peri-wound warmth or erythema. No masses.Marland Kitchen Psychiatric Judgement and insight Intact.. No evidence of depression, anxiety, or agitation.. Notes the wound on the right lower extremity has some slough which were sharply debrided. The area on the medial heel is looking much cleaner and has some eschar which was sharply removed. The area between her toes continue to have linear ulcers and this is possibly from the edema and moisture. Electronic Signature(s) Signed: 08/19/2015 12:01:38 PM By: Evlyn Kanner MD, FACS Entered By: Evlyn Kanner on 08/19/2015 12:01:37 [name] ([id number]) -------------------------------------------------------------------------------- Physician Orders Details Patient Name: [name], [name] 08/19/2015 10:45 Date of Service: AM Medical Record [id number] Number: Patient Account Number: [id number] 06/16/1921 (81 y.o. Treating RN: Curtis Sites Date of Birth/Sex: Female) Other Clinician: Primary Care  Physician: Terance Hart, DAVID Treating Sehaj Kolden Referring Physician: Dorothey Baseman Physician/Extender: Tania Ade in Treatment: 27 Verbal / Phone Orders: Yes Clinician: Curtis Sites Read Back and Verified: Yes Diagnosis Coding Wound Cleansing Wound #1 Right Calcaneous o Cleanse wound with mild soap and water o May Shower, gently pat wound dry prior to applying new dressing. Wound #6 Right,Posterior Lower Leg o Cleanse wound with mild soap and water o May Shower, gently pat wound dry prior to applying new dressing. Anesthetic Wound #1 Right Calcaneous o Topical Lidocaine 4% cream applied to wound bed prior to debridement Wound #6 Right,Posterior Lower Leg o Topical Lidocaine 4% cream applied to wound bed prior to debridement Primary Wound Dressing o Other: - Aquacel  Ag rope between toes Wound #1 Right Calcaneous o Prisma Ag Wound #6 Right,Posterior Lower Leg o Medihoney gel Secondary Dressing Wound #1 Right Calcaneous o ABD pad o Gauze and Kerlix/Conform Wound #6 Right,Posterior Lower Leg o ABD pad o Gauze and Kerlix/Conform Dressing Change Frequency Shimkus, Aubreanna ([id number]) Wound #1 Right Calcaneous o Change dressing every other day. Wound #6 Right,Posterior Lower Leg o Change dressing every other day. Follow-up Appointments Wound #1 Right Calcaneous o Return Appointment in 2 weeks. Wound #6 Right,Posterior Lower Leg o Return Appointment in 2 weeks. Home Health Wound #1 Right Calcaneous o Continue Home Health Visits - [facility] o Home Health Nurse may visit PRN to address patientos wound care needs. o FACE TO FACE ENCOUNTER: MEDICARE and MEDICAID PATIENTS: I certify that this patient is under my care and that I had a face-to-face encounter that meets the physician face-to-face encounter requirements with this patient on this date. The encounter with the patient was in whole or in part for the following MEDICAL CONDITION:  (primary reason for Home Healthcare) MEDICAL NECESSITY: I certify, that based on my findings, NURSING services are a medically necessary home health service. HOME BOUND STATUS: I certify that my clinical findings support that this patient is homebound (i.e., Due to illness or injury, pt requires aid of supportive devices such as crutches, cane, wheelchairs, walkers, the use of special transportation or the assistance of another person to leave their place of residence. There is a normal inability to leave the home and doing so requires considerable and taxing effort. Other absences are for medical reasons / religious services and are infrequent or of short duration when for other reasons). o If current dressing causes regression in wound condition, may D/C ordered dressing product/s and apply Normal Saline Moist Dressing daily until next Wound Healing Center / Other MD appointment. Notify Wound Healing Center of regression in wound condition at [phone]. o Please direct any NON-WOUND related issues/requests for orders to patient's Primary Care Physician Wound #6 Right,Posterior Lower Leg o Continue Home Health Visits - [facility] o Home Health Nurse may visit PRN to address patientos wound care needs. o FACE TO FACE ENCOUNTER: MEDICARE and MEDICAID PATIENTS: I certify that this patient is under my care and that I had a face-to-face encounter that meets the physician face-to-face encounter requirements with this patient on this date. The encounter with the patient was in whole or in part for the following MEDICAL CONDITION: (primary reason for Home Healthcare) MEDICAL NECESSITY: I certify, that based on my findings, NURSING services are a medically necessary home health service. HOME BOUND STATUS: I certify that my clinical findings support that this patient is homebound (i.e., Due to illness or injury, pt requires aid of supportive devices such as crutches, cane, wheelchairs,  walkers, the use of special transportation or the assistance of another person to leave their place of residence. There is a normal inability to leave the home and doing so requires considerable and taxing effort. Breeona Waid, Collene ([id number]) absences are for medical reasons / religious services and are infrequent or of short duration when for other reasons). o If current dressing causes regression in wound condition, may D/C ordered dressing product/s and apply Normal Saline Moist Dressing daily until next Wound Healing Center / Other MD appointment. Notify Wound Healing Center of regression in wound condition at [phone]. o Please direct any NON-WOUND related issues/requests for orders to patient's Primary Care Physician Electronic Signature(s) Signed: 08/19/2015 4:47:37 PM By: Evlyn Kanner MD, FACS Signed:  08/19/2015 5:17:47 PM By: Curtis Sitesorthy, Joanna Entered By: Curtis Sitesorthy, Joanna on 08/19/2015 11:30:06 [name] NationsSILETZKY, Gianni ([id number]) -------------------------------------------------------------------------------- Problem List Details Patient Name: [name] NationsSILETZKY, [name] 08/19/2015 10:45 Date of Service: AM Medical Record [id number] Number: Patient Account Number: [id number] 06/22/1921 (81 y.o. 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 02/11/2015 Yes fat layer exposed L97.812 Non-pressure chronic ulcer of other part of right lower leg 02/11/2015  Yes with fat layer exposed T81.31XA Disruption of external operation (surgical) wound, not 02/18/2015 Yes elsewhere classified, initial encounter Inactive Problems Resolved Problems Electronic Signature(s) Signed: 08/19/2015 11:59:49 AM By: Evlyn KannerBritto, Anjanette Gilkey MD, FACS Entered By: Evlyn KannerBritto, Soledad Budreau on 08/19/2015 11:59:49 [name] NationsSILETZKY, [name] ([id number]) -------------------------------------------------------------------------------- Progress Note Details Patient Name: [name] NationsSILETZKY, [name] 08/19/2015 10:45 Date of Service: AM Medical Record [id number] Number: Patient Account Number: [id number] 11/20/1920 (81 y.o. Treating RN: Curtis Sitesorthy, Joanna Date of Birth/Sex: Female) Other Clinician: Primary Care Physician: Terance HartBRONSTEIN, DAVID Treating Demetre Monaco Referring Physician: Dorothey BasemanBRONSTEIN, DAVID Physician/Extender: Tania AdeWeeks in Treatment: 27 Subjective Chief Complaint Information obtained from Patient Patient presents to the wound care center for a consult due non healing wound. 81 year old patient with comes with a history of a ulcerated area to the right third toe and the right heel which she's had for about 2 months. History of Present Illness (HPI) The following HPI elements were documented for the patient's wound: Location: right medial heel and right third toe Quality: Patient reports experiencing a dull pain to affected area(s). Severity: Patient states wound are getting worse. Duration: Patient has had the wound for > 3 months prior to seeking treatment at the wound center Timing: Pain in wound is Intermittent (comes and goes Context: The wound appeared gradually over time Modifying Factors: Consults to this date include:vascular surgeon and several recent surgeries. Associated Signs and Symptoms: Patient reports having foul odor and drainage from the left below-knee amputation site. A pleasant 81 year old patient who is known to have diabetes mellitus for several years has recently gone through a series  of operations with the vascular surgeon Dr. Gilda CreaseSchnier. In January and February she had several surgeries on the left lower extremity with an attempt to have limb salvage for gangrenous changes of her forefoot. 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 On 01/07/2015 she also had a right lower extremity distal runoff with a angioplasty of the right anterior tibial artery to maximize her blood flow to the foot. She recently had her staples removed at Dr. Marijean HeathSchnier's office on 01/31/2015 and at that time a right lower extremity duplex was done which showed patent vessels and a patent stent with distal occluded posterior tibial artery. Though the duplex was noncritical the recommendations from the PA at the vascular surgery office was that of a angiogram to be done but the patient said she would rather try some bone care before. The patient has received doxycycline and Cipro in the recent past and she takes oral medications for her diabetes. Other than that I reviewed her list of all her medications. No recent hemoglobin A1c has been  done and no recent x-rays of the right foot have been done. 02/18/2015 -- x-ray of the right foot was done on 02/11/2015 and it shows no evidence of acute osteomyelitis of the third toe or of the calcaneus. She had gone to the vascular surgery office and they had noted that there is dehiscence of the left part of Tschetter, Walaa ([id number]) the amputation site of the below-knee wound and they have asked [place] to kindly take over the care of this. There've been doing dressings for the right heel and right third toe. 02/25/2015 -- we have received notes from the vascular group who saw her last on 02/13/2015 and she was seen by the PA Ms. Cleda Daub. She had recommended that the patient continue to follow with [place] for wound care including management of the left BKA stump, which has had dehiscence  of the lateral part. They will consider repeating a arterial duplex or angiogram if the right lower extremity does not heal within a reasonable period of time. 03/18/2015 -- he saw the vascular surgeon Dr. Gilda Crease and he has set her up for an angioplasty sometime in the middle of July. she is doing well otherwise. 04/10/2015 -- the patient had a procedure done on 04/08/2015 and this was a right angioplasty of the dorsalis pedis, anterior tibial artery, first superficial femoral artery in its midportion. There was successful intervention with recanalization and in-line flow to the right foot. 04/22/2015 -- the patient was looking rather pale today and the daughter confirms that her hemoglobin was down to 6.9. she is being monitored by her PCP. Her Lasix dose was increased and her edema has gone down significantly. 04/29/2015 -- she had vascular studies done and the ABI on the right is 1.3 and the toe pressures were within normal limits. Her hemoglobin is still around 7 and she refuses to take any blood transfusions for religious reasons. Her potassium was normal. 06/10/2015 -- she has a new blister on her right lateral and posterior part of her leg just in the region where the Kerlix bandages were applied and this may be due to an abrasion. 06/24/2015 -- On her right lower extremity she has developed several more blisters which look like small pustules and the drain informed shallow ulcerations. 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 07/01/2015 -- she has an appointment with the PCP this Friday and her vascular surgeon on Monday. Other than that she is on doxycycline and has finished 1 week of treatment. The pustules she had all over have now resolved. 07/08/2015 -- she saw the vascular surgeon who did studies in the office and found that there is poor circulation in the distal lower right leg and has set her up for an angiogram and possible stenting next Tuesday. 07/22/2015 -- on October 18 she  was taken up for a successful revascularization of the anterior tibial vessel on the right side. a PTA and stent placement was done to the right anterior tibial artery. 08/19/2015 -- she has broken out with some linear ulceration in the region of her webspaces of her toes and this is something new. Objective Constitutional Pulse regular. Respirations normal and unlabored. Afebrile. Vitals Time Taken: 11:05 AM, Height: 62 in, Weight: 155 lbs, BMI: 28.3, Temperature: 97.3 F, Pulse: 48 [name], [name] ([id number]) bpm, Respiratory Rate: 18 breaths/min, Blood Pressure: 134/48 mmHg. Eyes Nonicteric. Reactive to light. Ears, Nose, Mouth, and Throat Lips, teeth, and gums WNL.Marland Kitchen Moist mucosa without lesions . Neck supple and nontender. No palpable supraclavicular or  cervical adenopathy. Normal sized without goiter. Respiratory WNL. No retractions.. Cardiovascular Pedal Pulses WNL. No clubbing, cyanosis or edema. Lymphatic No adneopathy. No adenopathy. No adenopathy. Musculoskeletal Adexa without tenderness or enlargement.. Digits and nails w/o clubbing, cyanosis, infection, petechiae, ischemia, or inflammatory conditions.Marland Kitchen Psychiatric Judgement and insight Intact.. No evidence of depression, anxiety, or agitation.. General Notes: the wound on the right lower extremity has some slough which were sharply debrided. The area on the medial heel is looking much cleaner and has some eschar which was sharply removed. The area between her toes continue to have linear ulcers and this is possibly from the edema and moisture. Integumentary (Hair, Skin) No suspicious lesions. No crepitus or fluctuance. No peri-wound warmth or erythema. No masses.. Wound #1 status is Open. Original cause of wound was Gradually Appeared. The wound is located on the Right Calcaneous. The wound measures 0.3cm length x 0.6cm width x 0.1cm depth; 0.141cm^2 area and 0.014cm^3 volume. The wound is limited to skin breakdown.  There is no tunneling or undermining noted. There is a medium amount of serosanguineous drainage noted. The wound margin is distinct with the outline attached to the wound base. There is medium (34-66%) pink, pale granulation within the wound bed. There is a medium (34-66%) amount of necrotic tissue within the wound bed. The periwound skin appearance exhibited: Dry/Scaly. The periwound skin appearance did not exhibit: Callus, Crepitus, Excoriation, Fluctuance, Friable, Induration, Localized Edema, Rash, Scarring, Maceration, Moist, Atrophie Blanche, Cyanosis, Ecchymosis, Hemosiderin Staining, Mottled, Pallor, Rubor, Erythema. The periwound has tenderness on palpation. Wound #6 status is Open. Original cause of wound was Blister. The wound is located on the Right,Posterior Lower Leg. The wound measures 1.5cm length x 0.8cm width x 0.2cm depth; 0.942cm^2 area and 0.188cm^3 volume. The wound is limited to skin breakdown. There is no tunneling or undermining noted. There is a none present amount of drainage noted. The wound margin is flat and intact. There is medium Erby, Emmaline ([id number]) (34-66%) pink granulation within the wound bed. There is a medium (34-66%) amount of necrotic tissue within the wound bed including Adherent Slough. The periwound skin appearance did not exhibit: Callus, Crepitus, Excoriation, Fluctuance, Friable, Induration, Localized Edema, Rash, Scarring, Dry/Scaly, Maceration, Moist, Atrophie Blanche, Cyanosis, Ecchymosis, Hemosiderin Staining, Mottled, Pallor, Rubor, Erythema. The periwound has tenderness on palpation. Wound #7 status is Open. Original cause of wound was Gradually Appeared. The wound is located on the Right,Anterior Lower Leg. The wound measures 0cm length x 0cm width x 0cm depth; 0cm^2 area and 0cm^3 volume. 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 Prisma AG to be applied to the medial right heel and changed on alternate days 2. Use Aquacel Ag rope between her toes and apply a light dressing and changed on alternate days 3. Continue with Medihoney on the right lower extremity ulcerations especially on the posterior lateral part. 4. 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 There  was a Skin/Subcutaneous Tissue Debridement ([id number]) debridement with total area of 0.18 sq cm performed by Evlyn Kanner, MD. with the following instrument(s): Forceps to remove Viable and Non-Viable tissue/material including Exudate, Fibrin/Slough, Skin, and Subcutaneous after achieving pain control using Lidocaine 4% Topical Solution. A time out was conducted prior to the start of the procedure. There was no bleeding. The procedure was tolerated well with a pain level of 8 throughout and a pain level of 0 following the procedure. Post Debridement Measurements: 0.3cm length x 0.6cm width x 0.1cm depth; 0.014cm^3 volume. 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 There was a Skin/Subcutaneous Tissue Debridement ([id number]) debridement with total area of 1.2 sq cm performed by Evlyn Kanner, MD. with the following instrument(s): Forceps to remove Viable and Non-Viable tissue/material including  Exudate, Fibrin/Slough, Skin, and Subcutaneous after achieving pain control using Lidocaine 4% Topical Solution. A time out was conducted prior to the start of the procedure. There was no bleeding. The procedure was tolerated well with a pain level of 10 throughout and a pain level of 0 following the procedure. Post Debridement Measurements: 1.5cm length x 0.8cm width x 0.2cm depth; 0.188cm^3 volume. Post procedure Diagnosis Wound #6: Same as Pre-Procedure Plan Wound Cleansing: Wound #1 Right Calcaneous: Cleanse wound with mild soap and water May Shower, gently pat wound dry prior to applying new dressing. Wound #6 Right,Posterior Lower Leg: Cleanse wound with mild soap and water May Shower, gently pat wound dry prior to applying new dressing. Anesthetic: Wound #1 Right Calcaneous: Topical Lidocaine 4% cream applied to wound bed prior to debridement Wound #6 Right,Posterior Lower Leg: Topical Lidocaine 4% cream applied to wound bed prior to debridement Primary Wound Dressing: Other: - Aquacel Ag rope between toes Wound #1 Right Calcaneous: Prisma Ag Wound #6 Right,Posterior Lower Leg: Medihoney gel Secondary Dressing: Wound #1 Right Calcaneous: ABD pad Gauze and Kerlix/Conform Wound #6 Right,Posterior Lower Leg: ABD pad Gauze and Kerlix/Conform Dressing Change Frequency: Wound #1 Right Calcaneous: Change dressing every other day. Wound #6 Right,Posterior Lower Leg: Change dressing every other day. Follow-up Appointments: [name], [facility]IMEK ([id number]) Wound #1 Right Calcaneous: Return Appointment in 2 weeks. Wound #6 Right,Posterior Lower Leg: Return Appointment in 2 weeks. Home Health: Wound #1 Right Calcaneous: Continue Home Health Visits - [facility] Nurse may visit PRN to address patient s wound care needs. FACE TO FACE ENCOUNTER: MEDICARE and MEDICAID PATIENTS: I certify that this patient is under my care and that I had a face-to-face encounter that meets  the physician face-to-face encounter requirements with this patient on this date. The encounter with the patient was in whole or in part for the following MEDICAL CONDITION: (primary reason for Home Healthcare) MEDICAL NECESSITY: I certify, that based on my findings, NURSING services are a medically necessary home health service. HOME BOUND STATUS: I certify that my clinical findings support that this patient is homebound (i.e., Due to illness or injury, pt requires aid of supportive devices such as crutches, cane, wheelchairs, walkers, the use of special transportation or the assistance of another person to leave their place of residence. There is a normal inability to leave the home and doing so requires considerable and taxing effort. Other absences are for medical reasons / religious services and are infrequent or of short duration when for other reasons). If current dressing causes regression in wound condition, may D/C ordered dressing product/s and  apply Normal Saline Moist Dressing daily until next Wound Healing Center / Other MD appointment. Notify Wound Healing Center of regression in wound condition at [phone]. Please direct any NON-WOUND related issues/requests for orders to patient's Primary Care Physician Wound #6 Right,Posterior Lower Leg: Continue Home Health Visits - [facility] Nurse may visit PRN to address patient s wound care needs. FACE TO FACE ENCOUNTER: MEDICARE and MEDICAID PATIENTS: I certify that this patient is under my care and that I had a face-to-face encounter that meets the physician face-to-face encounter requirements with this patient on this date. The encounter with the patient was in whole or in part for the following MEDICAL CONDITION: (primary reason for Home Healthcare) MEDICAL NECESSITY: I certify, that based on my findings, NURSING services are a medically necessary home health service. HOME BOUND STATUS: I certify that my clinical findings  support that this patient is homebound (i.e., Due to illness or injury, pt requires aid of supportive devices such as crutches, cane, wheelchairs, walkers, the use of special transportation or the assistance of another person to leave their place of residence. There is a normal inability to leave the home and doing so requires considerable and taxing effort. Other absences are for medical reasons / religious services and are infrequent or of short duration when for other reasons). If current dressing causes regression in wound condition, may D/C ordered dressing product/s and apply Normal Saline Moist Dressing daily until next Wound Healing Center / Other MD appointment. Notify Wound Healing Center of regression in wound condition at [phone]. Please direct any NON-WOUND related issues/requests for orders to patient's Primary Care Physician After thoroughly reviewing her wounds I have recommended: 1. Prisma AG to be applied to the medial right heel and changed on alternate days 2. Use Aquacel Ag rope between her toes and apply a light dressing and changed on alternate days 3. Continue with Medihoney on the right lower extremity ulcerations especially on the posterior lateral part. [facility], Meilin ([id number]) 4. see me back in one or 2 weeks as needed Electronic Signature(s) Signed: 08/19/2015 12:02:51 PM By: Evlyn Kanner MD, FACS Entered By: Evlyn Kanner on 08/19/2015 12:02:51 [name] ([id number]) -------------------------------------------------------------------------------- SuperBill Details Patient Name: [name] Date of Service: 08/19/2015 Medical Record Number: [id number] Patient Account Number: [id number] Date of Birth/Sex: [birth year] (81 y.o. 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 L97.812 Non-pressure chronic ulcer of other part of right lower leg with fat layer exposed Disruption of external operation (surgical) wound, not elsewhere classified, initial T81.31XA encounter Facility Procedures CPT4: Description Modifier Quantity Code [id number] 11042 - DEB SUBQ TISSUE 20 SQ CM/< 1 ICD-10 Description Diagnosis E11.621 Type 2 diabetes mellitus with foot ulcer I70.235 Atherosclerosis of native arteries of right leg with ulceration of other part of  foot 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 L97.812 Non-pressure chronic ulcer of other part of right lower leg with fat layer exposed Physician Procedures CPT4: Description Modifier Quantity Code [id number] 11042 - WC PHYS SUBQ TISS 20 SQ CM 1 ICD-10 Description Diagnosis E11.621 Type 2 diabetes mellitus with foot  ulcer I70.235 Atherosclerosis of native arteries of right leg with ulceration of other part of  foot 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

## 2015-08-20 NOTE — Progress Notes (Signed)
[name] ([id number]) Visit Report for 08/19/2015 Arrival Information Details Patient Name: [name], [name] Date of Service: 08/19/2015 10:45 AM Medical Record Number: [id number] Patient Account Number: [id number] Date of Birth/Sex: 11-18-1920 (81 y.o. Female) Treating RN: Curtis Sites Primary Care Physician: Dorothey Baseman Other Clinician: Referring Physician: Dorothey Baseman Treating Physician/Extender: Rudene Re in Treatment: 27 Visit Information History Since Last Visit Added or deleted any medications: No Patient Arrived: Wheel Chair Any new allergies or adverse reactions: No Arrival Time: 11:01 Had a fall or experienced change in No activities of daily living that may affect Accompanied By: dtr risk of falls: Transfer Assistance: Manual Signs or symptoms of abuse/neglect since last No Patient Identification Verified: Yes visito Secondary Verification Process Yes Hospitalized since last visit: No Completed: Pain Present Now: No Patient Has Alerts: Yes Patient Alerts: DMII Electronic Signature(s) Signed: 08/19/2015 5:17:47 PM By: Curtis Sites Entered By: Curtis Sites on 08/19/2015 11:02:24 [name] ([id number]) -------------------------------------------------------------------------------- Encounter Discharge Information Details Patient Name: [name] Date of Service: 08/19/2015 10:45 AM Medical Record Number: [id number] Patient Account Number: [id number] Date of Birth/Sex: August 13, 1921 (81 y.o. Female) Treating RN: Curtis Sites Primary Care Physician: Dorothey Baseman Other Clinician: Referring Physician: Dorothey Baseman Treating Physician/Extender: Rudene Re in Treatment: 58 Encounter Discharge Information Items Discharge Pain Level: 0 Discharge Condition: Stable Ambulatory Status: Wheelchair Discharge Destination: Home Transportation: Private Auto Accompanied By: dtr Schedule Follow-up Appointment:  Yes Medication Reconciliation completed and provided to Patient/Care No Kamariya Blevens: Provided on Clinical Summary of Care: 08/19/2015 Form Type Recipient Paper Patient HS Electronic Signature(s) Signed: 08/19/2015 11:45:18 AM By: Gwenlyn Perking Entered By: Gwenlyn Perking on 08/19/2015 11:45:18 [name] ([id number]) -------------------------------------------------------------------------------- Lower Extremity Assessment Details Patient Name: [name] Date of Service: 08/19/2015 10:45 AM Medical Record Number: [id number] Patient Account Number: [id number] Date of Birth/Sex: 1921/05/22 (81 y.o. Female) Treating RN: Curtis Sites Primary Care Physician: Dorothey Baseman Other Clinician: Referring Physician: Dorothey Baseman Treating Physician/Extender: Rudene Re in Treatment: 27 Edema Assessment Assessed: [Left: No] [Right: No] Edema: [Left: Ye] [Right: s] Vascular Assessment Pulses: Posterior Tibial Palpable: [Right:No] Doppler: [Right:Monophasic] Dorsalis Pedis Palpable: [Right:No] Doppler: [Right:Monophasic] Extremity colors, hair growth, and conditions: Extremity Color: [Right:Pale] Hair Growth on Extremity: [Right:No] Temperature of Extremity: [Right:Cool] Capillary Refill: [Right:< 3 seconds] Electronic Signature(s) Signed: 08/19/2015 5:17:47 PM By: Curtis Sites Entered By: Curtis Sites on 08/19/2015 11:17:54 [name] ([id number]) -------------------------------------------------------------------------------- Multi Wound Chart Details Patient Name: [name] Date of Service: 08/19/2015 10:45 AM Medical Record Number: [id number] Patient Account Number: [id number] Date of Birth/Sex: Sep 28, 1920 (81 y.o. 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] Comorbid History: [1:Arrhythmia, Deep Vein Thrombosis, Peripheral Arterial Disease, Type II Arterial Disease, Type II Diabetes] [6:Arrhythmia, Deep Vein Thrombosis, Peripheral Diabetes] [7:N/A] Date Acquired: [1:11/04/2014] [6:06/12/2015] [7:06/18/2015] Weeks of Treatment: [1:27] [6:9] [7:8] Wound Status: [1:Open] [6:Open] [7:Open] Measurements L x W x D 0.3x0.6x0.1 [6:1.5x0.8x0.2] [7:0x0x0] (cm) Area (cm) : [1:0.141] [6:0.942] [7:0] Volume (cm) : [1:0.014] [6:0.188] [7:0] % Reduction in Area: [1:97.40%] [6:-20.00%] [7:100.00%] % Reduction in Volume: 97.50% [6:-138.00%] [7:100.00%] Classification: [1:Full Thickness  Without Exposed Support Structures] [6:Full Thickness Without Exposed Support Structures] [7:Grade 1] HBO Classification: [1:Grade 1] [6:Grade 1] [7:N/A] Exudate Amount: [1:Medium] [6:None Present] [7:N/A] Exudate Type: [1:Serosanguineous] [6:N/A] [7:N/A] Exudate Color: [1:red, brown] [6:N/A] [7:N/A] Wound Margin: [1:Distinct, outline attached Flat and Intact] [7:N/A] Granulation Amount: [1:Medium (34-66%)] [6:Medium (34-66%)] [7:N/A] Granulation Quality: [1:Pink, Pale] [6:Pink] [7:N/A] Necrotic Amount: [1:Medium (34-66%)] [6:Medium (34-66%)] [7:N/A] Exposed Structures: Fascia: No Fascia: No N/A Fat: No Fat: No Tendon: No Tendon: No Muscle: No Muscle: No Joint: No Joint: No Bone: No Bone: No Limited to Skin Limited to Skin Breakdown Breakdown Epithelialization: Small  (1-33%) None N/A Periwound Skin Texture: Edema: No Edema: No No Abnormalities Noted Excoriation: No Excoriation: No Induration: No Induration: No Callus: No Callus: No Crepitus: No Crepitus: No Fluctuance: No Fluctuance: No Friable: No Friable: No Rash: No Rash: No Scarring: No Scarring: No Periwound Skin Dry/Scaly: Yes Maceration: No No Abnormalities Noted Moisture: Maceration: No Moist: No Moist: No Dry/Scaly: No Periwound Skin Color: Atrophie Blanche: No Atrophie Blanche: No No Abnormalities Noted Cyanosis: No Cyanosis: No Ecchymosis: No Ecchymosis: No Erythema: No Erythema: No Hemosiderin Staining: No Hemosiderin Staining: No Mottled: No Mottled: No Pallor: No Pallor: No Rubor: No Rubor: No Tenderness on Yes Yes No Palpation: Wound Preparation: Ulcer Cleansing: Ulcer Cleansing: N/A Rinsed/Irrigated with Rinsed/Irrigated with Saline Saline Topical Anesthetic Topical Anesthetic Applied: Other: lidocaine Applied: Other: lidocaine 4% 4% Treatment Notes Electronic Signature(s) Signed: 08/19/2015 5:17:47 PM By: Curtis Sitesorthy, Joanna Entered By: Curtis Sitesorthy, Joanna on 08/19/2015 11:24:36 [name] NationsSILETZKY, Emnet ([id number]) -------------------------------------------------------------------------------- Multi-Disciplinary Care Plan Details Patient Name: [name] NationsSILETZKY, Jiselle Date of Service: 08/19/2015 10:45 AM Medical Record Number: [id number] Patient Account Number: [id number] Date of Birth/Sex: 04/08/1921 (81 y.o. Female) Treating RN: Curtis Sitesorthy, Joanna Primary Care Physician: Dorothey BasemanBRONSTEIN, DAVID Other Clinician: Referring Physician: Dorothey BasemanBRONSTEIN, DAVID Treating Physician/Extender: Rudene ReBritto, Errol Weeks in Treatment: 3227 Active Inactive Abuse / Safety / Falls / Self Care Management Nursing Diagnoses: Impaired physical mobility Potential for falls Goals: Patient will remain injury free Date Initiated: 02/11/2015 Goal Status: Active Patient/caregiver will verbalize understanding of skin  care regimen Date Initiated: 02/11/2015 Goal Status: Active Patient/caregiver will verbalize/demonstrate measures taken to prevent injury and/or falls Date Initiated: 02/11/2015 Goal Status: Active Patient/caregiver will verbalize/demonstrate understanding of what to do in case of emergency Date Initiated: 02/11/2015 Goal Status: Active Interventions: Assess fall risk on admission and as needed Provide education on fall prevention Provide education on safe transfers Treatment Activities: Patient referred to home care : 08/19/2015 Notes: Nutrition Nursing Diagnoses: Imbalanced nutrition Potential for alteratiion in Nutrition/Potential for imbalanced nutrition Schlick, Ailea ([id number]) Goals: Patient/caregiver verbalizes understanding of need to maintain therapeutic glucose control per primary care physician Date Initiated: 02/11/2015 Goal Status: Active Patient/caregiver will maintain therapeutic glucose control Date Initiated: 02/11/2015 Goal Status: Active Interventions: Assess HgA1c results as ordered upon admission and as needed Provide education on elevated blood sugars and impact on wound healing Provide education on nutrition Treatment Activities: Education provided on Nutrition : 04/22/2015 Obtain HgA1c : 08/19/2015 Notes: Wound/Skin Impairment Nursing Diagnoses: Impaired tissue integrity Knowledge deficit related to ulceration/compromised skin integrity Goals: Patient/caregiver will verbalize understanding of skin care regimen Date Initiated: 02/11/2015 Goal Status: Active Ulcer/skin breakdown will heal within 14 weeks Date Initiated: 02/11/2015 Goal Status: Active Interventions: Assess patient/caregiver ability to obtain necessary supplies Assess patient/caregiver ability to perform ulcer/skin care regimen upon admission and as needed Assess ulceration(s) every visit Provide education on ulcer and skin care Treatment Activities: Skin care regimen initiated :  08/19/2015 Topical wound management initiated : 08/19/2015 Notes:  PRETTY, WELTMAN ([id number]) Electronic Signature(s) Signed: 08/19/2015 5:17:47 PM By: Curtis Sites Entered By: Curtis Sites on 08/19/2015 11:24:28 [name] ([id number]) -------------------------------------------------------------------------------- Patient/Caregiver Education Details Patient Name: [name] Date of Service: 08/19/2015 10:45 AM Medical Record Number: [id number] Patient Account Number: [id number] Date of Birth/Gender: Dec 19, 1920 (81 y.o. Female) Treating RN: Curtis Sites Primary Care Physician: Dorothey Baseman Other Clinician: Referring Physician: Dorothey Baseman Treating Physician/Extender: Rudene Re in Treatment: 59 Education Assessment Education Provided To: Patient and Caregiver Education Topics Provided Wound/Skin Impairment: Handouts: Other: wound care as ordered Methods: Demonstration, Explain/Verbal Responses: State content correctly Electronic Signature(s) Signed: 08/19/2015 5:17:47 PM By: Curtis Sites Entered By: Curtis Sites on 08/19/2015 11:45:33 [name] ([id number]) -------------------------------------------------------------------------------- Wound Assessment Details Patient Name: [name] Date of Service: 08/19/2015 10:45 AM Medical Record Number: [id number] Patient Account Number: [id number] Date of Birth/Sex: [birth year] (81 y.o. Female) Treating RN: Curtis Sites Primary Care Physician: Dorothey Baseman Other Clinician: Referring Physician: Terance Hart, DAVID Treating Physician/Extender: Rudene Re in Treatment: 27 Wound Status Wound Number: 1 Primary Arterial Insufficiency Ulcer Etiology: Wound Location: Right Calcaneous Wound Open Wounding Event: Gradually Appeared Status: Date Acquired: 11/04/2014 Comorbid Arrhythmia, Deep Vein Thrombosis, Weeks Of Treatment: 27 History: Peripheral Arterial Disease, Type  II Clustered Wound: No Diabetes Photos Photo Uploaded By: Curtis Sites on 08/19/2015 11:53:08 Wound Measurements Length: (cm) 0.3 Width: (cm) 0.6 Depth: (cm) 0.1 Area: (cm) 0.141 Volume: (cm) 0.014 % Reduction in Area: 97.4% % Reduction in Volume: 97.5% Epithelialization: Small (1-33%) Tunneling: No Undermining: No Wound Description Full Thickness Without Exposed Classification: Support Structures Diabetic Severity Grade 1 (Wagner): Wound Margin: Distinct, outline attached Exudate Amount: Medium Exudate Type: Serosanguineous Exudate Color: red, brown Foul Odor After Cleansing: No Wound Bed Granulation Amount: Medium (34-66%) Exposed Structure Heinrichs, Paulena ([id number]) Granulation Quality: Pink, Pale Fascia Exposed: No Necrotic Amount: Medium (34-66%) Fat Layer Exposed: No Tendon Exposed: No Muscle Exposed: No Joint Exposed: No Bone Exposed: No Limited to Skin Breakdown Periwound Skin Texture Texture Color No Abnormalities Noted: No No Abnormalities Noted: No Callus: No Atrophie Blanche: No Crepitus: No Cyanosis: No Excoriation: No Ecchymosis: No Fluctuance: No Erythema: No Friable: No Hemosiderin Staining: No Induration: No Mottled: No Localized Edema: No Pallor: No Rash: No Rubor: No Scarring: No Temperature / Pain Moisture Tenderness on Palpation: Yes No Abnormalities Noted: No Dry / Scaly: Yes Maceration: No Moist: No Wound Preparation Ulcer Cleansing: Rinsed/Irrigated with Saline Topical Anesthetic Applied: Other: lidocaine 4%, Treatment Notes Wound #1 (Right Calcaneous) 1. Cleansed with: Clean wound with Normal Saline 2. Anesthetic Topical Lidocaine 4% cream to wound bed prior to debridement 4. Dressing Applied: Prisma Ag 5. Secondary Dressing Applied ABD and Kerlix/Conform 7. Secured with Secretary/administrator) Signed: 08/19/2015 5:17:47 PM By: Curtis Sites Entered By: Curtis Sites on 08/19/2015  11:17:05 [name] ([id number]) Marita [place], Eyvonne Left ([id number]) -------------------------------------------------------------------------------- Wound Assessment Details Patient Name: [name] Date of Service: 08/19/2015 10:45 AM Medical Record Number: [id number] Patient Account Number: [id number] Date of Birth/Sex: 06/09/1921 (81 y.o. Female) Treating RN: Curtis Sites Primary Care Physician: Dorothey Baseman Other Clinician: Referring Physician: Dorothey Baseman Treating Physician/Extender: Rudene Re in Treatment: 27 Wound Status Wound Number: 6 Primary Arterial Insufficiency Ulcer Etiology: Wound Location: Right Lower Leg - Posterior Wound Open Wounding Event: Blister Status: Date Acquired: 06/12/2015 Comorbid Arrhythmia, Deep Vein Thrombosis, Weeks Of Treatment: 9 History: Peripheral Arterial Disease, Type II Clustered Wound: No Diabetes Photos Photo Uploaded By: Curtis Sites on 08/19/2015 11:53:10 Wound Measurements Length: (cm) 1.5 Width: (cm) 0.8 Depth: (cm) 0.2 Area: (  cm) 0.942 Volume: (cm) 0.188 % Reduction in Area: -20% % Reduction in Volume: -138% Epithelialization: None Tunneling: No Undermining: No Wound Description Full Thickness Without Foul Odor Aft Classification: Exposed Support Structures Diabetic Severity Grade 1 (Wagner): Wound Margin: Flat and Intact Exudate Amount: None Present er Cleansing: No Wound Bed Granulation Amount: Medium (34-66%) Exposed Structure Granulation Quality: Pink Fascia Exposed: No Necrotic Amount: Medium (34-66%) Fat Layer Exposed: No Ledger, Aubreyana ([id number]) Necrotic Quality: Adherent Slough Tendon Exposed: No Muscle Exposed: No Joint Exposed: No Bone Exposed: No Limited to Skin Breakdown Periwound Skin Texture Texture Color No Abnormalities Noted: No No Abnormalities Noted: No Callus: No Atrophie Blanche: No Crepitus: No Cyanosis: No Excoriation: No Ecchymosis:  No Fluctuance: No Erythema: No Friable: No Hemosiderin Staining: No Induration: No Mottled: No Localized Edema: No Pallor: No Rash: No Rubor: No Scarring: No Temperature / Pain Moisture Tenderness on Palpation: Yes No Abnormalities Noted: No Dry / Scaly: No Maceration: No Moist: No Wound Preparation Ulcer Cleansing: Rinsed/Irrigated with Saline Topical Anesthetic Applied: Other: lidocaine 4%, Treatment Notes Wound #6 (Right, Posterior Lower Leg) 1. Cleansed with: Clean wound with Normal Saline 2. Anesthetic Topical Lidocaine 4% cream to wound bed prior to debridement 4. Dressing Applied: Medihoney Gel 5. Secondary Dressing Applied ABD and Kerlix/Conform 7. Secured with Secretary/administrator) Signed: 08/19/2015 5:17:47 PM By: Curtis Sites Entered By: Curtis Sites on 08/19/2015 11:17:25 [name] ([id number]) -------------------------------------------------------------------------------- Wound Assessment Details Patient Name: [name] Date of Service: 08/19/2015 10:45 AM Medical Record Number: [id number] Patient Account Number: [id number] Date of Birth/Sex: [birth year] (81 y.o. Female) Treating RN: Curtis Sites Primary Care Physician: Dorothey Baseman Other Clinician: Referring Physician: Terance Hart, DAVID Treating Physician/Extender: Rudene Re in Treatment: 27 Wound Status Wound Number: 7 Primary Diabetic Wound/Ulcer of the Lower Etiology: Extremity Wound Location: Right, Anterior Lower Leg Wound Status: Open Wounding Event: Gradually Appeared Date Acquired: 06/18/2015 Weeks Of Treatment: 8 Clustered Wound: No Photos Photo Uploaded By: Curtis Sites on 08/19/2015 11:53:24 Wound Measurements Length: (cm) 0 % Reduction Width: (cm) 0 % Reduction Depth: (cm) 0 Area: (cm) 0 Volume: (cm) 0 in Area: 100% in Volume: 100% Wound Description Classification: Grade 1 Periwound Skin Texture Texture Color No Abnormalities  Noted: No No Abnormalities Noted: No Moisture No Abnormalities Noted: No Electronic Signature(s) Signed: 08/19/2015 5:17:47 PM By: Mechele Dawley, Danita ([id number]) Entered By: Curtis Sites on 08/19/2015 11:16:45 [name] ([id number]) -------------------------------------------------------------------------------- Vitals Details Patient Name: [name] Date of Service: 08/19/2015 10:45 AM Medical Record Number: [id number] Patient Account Number: [id number] Date of Birth/Sex: 10-Mar-1921 (81 y.o. Female) Treating RN: Curtis Sites Primary Care Physician: Dorothey Baseman Other Clinician: Referring Physician: Terance Hart, DAVID Treating Physician/Extender: Rudene Re in Treatment: 27 Vital Signs Time Taken: 11:05 Temperature (F): 97.3 Height (in): 62 Pulse (bpm): 48 Weight (lbs): 155 Respiratory Rate (breaths/min): 18 Body Mass Index (BMI): 28.3 Blood Pressure (mmHg): 134/48 Reference Range: 80 - 120 mg / dl Electronic Signature(s) Signed: 08/19/2015 5:17:47 PM By: Curtis Sites Entered By: Curtis Sites on 08/19/2015 11:06:29

## 2015-08-26 ENCOUNTER — Encounter: Payer: Medicare Other | Admitting: Surgery

## 2015-08-26 DIAGNOSIS — E11621 Type 2 diabetes mellitus with foot ulcer: Secondary | ICD-10-CM | POA: Diagnosis not present

## 2015-08-26 NOTE — Progress Notes (Signed)
NIKIAH, GOIN ([id number]) Visit Report for 08/26/2015 Chief Complaint Document Details Patient Name: [name], [name] Date of Service: 08/26/2015 8:00 AM Medical Record Number: [id number] Patient Account Number: [id number] Date of Birth/Sex: 11/02/1920 (81 y.o. Female) Treating RN: Primary Care Physician: Terance Hart, DAVID Other Clinician: Referring Physician: Terance Hart, DAVID Treating Physician/Extender: Rudene Re in Treatment: 28 Information Obtained from: Patient Chief Complaint Patient presents to the wound care center for a consult due non healing wound. 81 year old patient with comes with a history of a ulcerated area to the right third toe and the right heel which she's had for about 2 months. Electronic Signature(s) Signed: 08/26/2015 8:51:37 AM By: Evlyn Kanner MD, FACS Entered By: Evlyn Kanner on 08/26/2015 08:51:37 [name] ([id number]) -------------------------------------------------------------------------------- Debridement Details Patient Name: [name] Date of Service: 08/26/2015 8:00 AM Medical Record Number: [id number] Patient Account Number: [id number] Date of Birth/Sex: 10/01/1920 (81 y.o. Female) Treating RN: Primary Care Physician: Terance Hart, DAVID Other Clinician: Referring Physician: Terance Hart, DAVID Treating Physician/Extender: Rudene Re in Treatment: 28 Debridement Performed for Wound #6 Right,Posterior Lower Leg Assessment: Performed By: Physician Evlyn Kanner, MD Debridement: Debridement Pre-procedure Yes Verification/Time Out Taken: Start Time: 08:42 Pain Control: Lidocaine 4% Topical Solution Level: Skin/Subcutaneous Tissue Total Area Debrided (L x 2.3 (cm) x 2 (cm) = 4.6 (cm) W): Tissue and other Viable, Non-Viable, Fibrin/Slough, Subcutaneous material debrided: Instrument: Curette Bleeding: Minimum Hemostasis Achieved: Pressure End Time: 08:45 Procedural Pain: 8 Post Procedural Pain: 0 Response to  Treatment: Procedure was tolerated well Post Debridement Measurements of Total Wound Length: (cm) 2.3 Width: (cm) 2 Depth: (cm) 0.2 Volume: (cm) 0.723 Post Procedure Diagnosis Same as Pre-procedure Electronic Signature(s) Signed: 08/26/2015 8:51:30 AM By: Evlyn Kanner MD, FACS Entered By: Evlyn Kanner on 08/26/2015 08:51:30 [name] ([id number]) -------------------------------------------------------------------------------- HPI Details Patient Name: [name] Date of Service: 08/26/2015 8:00 AM Medical Record Number: [id number] Patient Account Number: [id number] Date of Birth/Sex: November 11, 1920 (81 y.o. Female) Treating RN: Primary Care Physician: Terance Hart, DAVID Other Clinician: Referring Physician: Terance Hart, DAVID Treating Physician/Extender: Rudene Re in Treatment: 28 History of Present Illness Location: right medial heel and right third toe Quality: Patient reports experiencing a dull pain to affected area(s). Severity: Patient states wound are getting worse. Duration: Patient has had the wound for > 3 months prior to seeking treatment at the wound center Timing: Pain in wound is Intermittent (comes and goes Context: The wound appeared gradually over time Modifying Factors: Consults to this date include:vascular surgeon and several recent surgeries. Associated Signs and Symptoms: Patient reports having foul odor and drainage from the left below-knee amputation site. HPI Description: A pleasant 81 year old patient who is known to have diabetes mellitus for several years has recently gone through a series of operations with the vascular surgeon Dr. Gilda Crease. In January and February she had several surgeries on the left lower extremity with an attempt to have limb salvage for gangrenous changes of her forefoot. 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 On 01/07/2015 she also had  a right lower extremity distal runoff with a angioplasty of the right anterior tibial artery to maximize her blood flow to the foot. She recently had her staples removed at Dr. Marijean Heath office on 01/31/2015 and at that time a right lower extremity duplex was done which showed patent vessels and a patent stent with distal occluded posterior tibial artery. Though the duplex was noncritical the recommendations from the PA at the vascular surgery office was  that of a angiogram to be done but the patient said she would rather try some bone care before. The patient has received doxycycline and Cipro in the recent past and she takes oral medications for her diabetes. Other than that I reviewed her list of all her medications. No recent hemoglobin A1c has been done and no recent x-rays of the right foot have been done. 02/18/2015 -- x-ray of the right foot was done on 02/11/2015 and it shows no evidence of acute osteomyelitis of the third toe or of the calcaneus. She had gone to the vascular surgery office and they had noted that there is dehiscence of the left part of the amputation site of the below-knee wound and they have asked [place] to kindly take over the care of this. There've been doing dressings for the right heel and right third toe. 02/25/2015 -- we have received notes from the vascular group who saw her last on 02/13/2015 and she was seen by the PA Ms. Cleda Daub. She had recommended that the patient continue to follow with [place] for wound care including management of the left BKA stump, which has had dehiscence of the lateral part. They will consider repeating a arterial duplex or angiogram if the right lower extremity does not heal within a reasonable period of time. 03/18/2015 -- he saw the vascular surgeon Dr. Gilda Crease and he has set her up for an angioplasty sometime in the middle of July. she is doing well otherwise. AZAIAH, MELLO ([id number]) 04/10/2015 -- the patient had a  procedure done on 04/08/2015 and this was a right angioplasty of the dorsalis pedis, anterior tibial artery, first superficial femoral artery in its midportion. There was successful intervention with recanalization and in-line flow to the right foot. 04/22/2015 -- the patient was looking rather pale today and the daughter confirms that her hemoglobin was down to 6.9. she is being monitored by her PCP. Her Lasix dose was increased and her edema has gone down significantly. 04/29/2015 -- she had vascular studies done and the ABI on the right is 1.3 and the toe pressures were within normal limits. Her hemoglobin is still around 7 and she refuses to take any blood transfusions for religious reasons. Her potassium was normal. 06/10/2015 -- she has a new blister on her right lateral and posterior part of her leg just in the region where the Kerlix bandages were applied and this may be due to an abrasion. 06/24/2015 -- On her right lower extremity she has developed several more blisters which look like small pustules and the drain informed shallow ulcerations. 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 07/01/2015 -- she has an appointment with the PCP this Friday and her vascular surgeon on Monday. Other than that she is on doxycycline and has finished 1 week of treatment. The pustules she had all over have now resolved. 07/08/2015 -- she saw the vascular surgeon who did studies in the office and found that there is poor circulation in the distal lower right leg and has set her up for an angiogram and possible stenting next Tuesday. 07/22/2015 -- on October 18 she was taken up for a successful revascularization of the anterior tibial vessel on the right side. a PTA and stent placement was done to the right anterior tibial artery. 08/19/2015 -- she has broken out with some linear ulceration in the region of her webspaces of her toes and this is something new. 08/26/2015 -- over the last 3 days there was a  streak of  redness going from her lower extremity towards her toes but she had no fever or discharge from the wounds. Electronic Signature(s) Signed: 08/26/2015 8:52:15 AM By: Evlyn Kanner MD, FACS Entered By: Evlyn Kanner on 08/26/2015 08:52:15 [name] ([id number]) -------------------------------------------------------------------------------- Physical Exam Details Patient Name: [name] Date of Service: 08/26/2015 8:00 AM Medical Record Number: [id number] Patient Account Number: [id number] Date of Birth/Sex: 12-08-1920 (81 y.o. Female) Treating RN: Primary Care Physician: Terance Hart, DAVID Other Clinician: Referring Physician: Terance Hart, DAVID Treating Physician/Extender: Rudene Re in Treatment: 28 Constitutional . Pulse regular. Respirations normal and unlabored. Afebrile. . Eyes Nonicteric. Reactive to light. Ears, Nose, Mouth, and Throat Lips, teeth, and gums WNL.Marland Kitchen Moist mucosa without lesions . Neck supple and nontender. No palpable supraclavicular or cervical adenopathy. Normal sized without goiter. Respiratory WNL. No retractions.. Cardiovascular Pedal Pulses WNL. No clubbing, cyanosis or edema. Lymphatic No adneopathy. No adenopathy. No adenopathy. Musculoskeletal Adexa without tenderness or enlargement.. Digits and nails w/o clubbing, cyanosis, infection, petechiae, ischemia, or inflammatory conditions.. Integumentary (Hair, Skin) No suspicious lesions. No crepitus or fluctuance. No peri-wound warmth or erythema. No masses.Marland Kitchen Psychiatric Judgement and insight Intact.. No evidence of depression, anxiety, or agitation.. Notes the wounds in the right lower extremity have some slough and this was sharply debrided with a curette. The medial heel is looking cleaner and the areas around her toes are much better. She has minimal cellulitis in the mid shin area and also the forefoot. No purulent drainage or warmth. Electronic Signature(s) Signed:  08/26/2015 8:53:07 AM By: Evlyn Kanner MD, FACS Entered By: Evlyn Kanner on 08/26/2015 08:53:07 [name] ([id number]) -------------------------------------------------------------------------------- Physician Orders Details Patient Name: [name] Date of Service: 08/26/2015 8:00 AM Medical Record Number: [id number] Patient Account Number: [id number] Date of Birth/Sex: [birth year] (81 y.o. Female) Treating RN: Curtis Sites Primary Care Physician: Dorothey Baseman Other Clinician: Referring Physician: Dorothey Baseman Treating Physician/Extender: Rudene Re in Treatment: 53 Verbal / Phone Orders: Yes Clinician: Curtis Sites Read Back and Verified: Yes Diagnosis Coding Wound Cleansing Wound #1 Right Calcaneous o Cleanse wound with mild soap and water o May Shower, gently pat wound dry prior to applying new dressing. Wound #6 Right,Posterior Lower Leg o Cleanse wound with mild soap and water o May Shower, gently pat wound dry prior to applying new dressing. Anesthetic Wound #1 Right Calcaneous o Topical Lidocaine 4% cream applied to wound bed prior to debridement Wound #6 Right,Posterior Lower Leg o Topical Lidocaine 4% cream applied to wound bed prior to debridement Primary Wound Dressing o Other: - Aquacel Ag rope between toes Wound #1 Right Calcaneous o Prisma Ag Wound #6 Right,Posterior Lower Leg o Medihoney gel Secondary Dressing Wound #1 Right Calcaneous o ABD pad o Gauze and Kerlix/Conform Wound #6 Right,Posterior Lower Leg o ABD pad o Gauze and Kerlix/Conform Dressing Change Frequency Wound #1 Right Calcaneous o Change dressing every other day. Chow, Alliya ([id number]) Wound #6 Right,Posterior Lower Leg o Change dressing every other day. Follow-up Appointments Wound #1 Right Calcaneous o Return Appointment in 2 weeks. Wound #6 Right,Posterior Lower Leg o Return Appointment in 2 weeks. Home  Health Wound #1 Right Calcaneous o Continue Home Health Visits - [facility] o Home Health Nurse may visit PRN to address patientos wound care needs. o FACE TO FACE ENCOUNTER: MEDICARE and MEDICAID PATIENTS: I certify that this patient is under my care and that I had a face-to-face encounter that meets the physician face-to-face encounter requirements with this patient on this date. The encounter with the patient was in  whole or in part for the following MEDICAL CONDITION: (primary reason for Home Healthcare) MEDICAL NECESSITY: I certify, that based on my findings, NURSING services are a medically necessary home health service. HOME BOUND STATUS: I certify that my clinical findings support that this patient is homebound (i.e., Due to illness or injury, pt requires aid of supportive devices such as crutches, cane, wheelchairs, walkers, the use of special transportation or the assistance of another person to leave their place of residence. There is a normal inability to leave the home and doing so requires considerable and taxing effort. Other absences are for medical reasons / religious services and are infrequent or of short duration when for other reasons). o If current dressing causes regression in wound condition, may D/C ordered dressing product/s and apply Normal Saline Moist Dressing daily until next Wound Healing Center / Other MD appointment. Notify Wound Healing Center of regression in wound condition at [phone]. o Please direct any NON-WOUND related issues/requests for orders to patient's Primary Care Physician Wound #6 Right,Posterior Lower Leg o Continue Home Health Visits - [facility] o Home Health Nurse may visit PRN to address patientos wound care needs. o FACE TO FACE ENCOUNTER: MEDICARE and MEDICAID PATIENTS: I certify that this patient is under my care and that I had a face-to-face encounter that meets the physician face-to-face encounter requirements with  this patient on this date. The encounter with the patient was in whole or in part for the following MEDICAL CONDITION: (primary reason for Home Healthcare) MEDICAL NECESSITY: I certify, that based on my findings, NURSING services are a medically necessary home health service. HOME BOUND STATUS: I certify that my clinical findings support that this patient is homebound (i.e., Due to illness or injury, pt requires aid of supportive devices such as crutches, cane, wheelchairs, walkers, the use of special transportation or the assistance of another person to leave their place of residence. There is a normal inability to leave the home and doing so requires considerable and taxing effort. Other absences are for medical reasons / religious services and are infrequent or of short duration when for other reasons). CRANFIELD, Kura ([id number]) o If current dressing causes regression in wound condition, may D/C ordered dressing product/s and apply Normal Saline Moist Dressing daily until next Wound Healing Center / Other MD appointment. Notify Wound Healing Center of regression in wound condition at [phone]. o Please direct any NON-WOUND related issues/requests for orders to patient's Primary Care Physician Patient Medications Allergies: cephalexin, tramadol, amoxicillin, prednisolone Notifications Medication Indication Start End doxycycline hyclate 08/26/2015 DOSE 1 - oral 100 mg capsule - 1 capsule oral BID Electronic Signature(s) Signed: 08/26/2015 8:51:07 AM By: Evlyn Kanner MD, FACS Entered By: Evlyn Kanner on 08/26/2015 08:51:07 [name] ([id number]) -------------------------------------------------------------------------------- Problem List Details Patient Name: [name] Date of Service: 08/26/2015 8:00 AM Medical Record Number: [id number] Patient Account Number: [id number] Date of Birth/Sex: 1921-06-23 (81 y.o. 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 02/11/2015 Yes fat layer exposed L97.812 Non-pressure chronic ulcer of other part of right lower leg 02/11/2015 Yes with fat layer exposed T81.31XA  Disruption of external operation (surgical) wound, not 02/18/2015 Yes elsewhere classified, initial encounter Inactive Problems Resolved Problems Electronic Signature(s) Signed: 08/26/2015 8:51:16 AM By: Evlyn Kanner MD, FACS Entered By: Evlyn Kanner on 08/26/2015 08:51:16 [name] ([id number]) -------------------------------------------------------------------------------- Progress Note Details Patient Name: [name] Date of Service: 08/26/2015 8:00 AM Medical Record Number: [id number] Patient Account Number: [id number] Date of Birth/Sex: 04-08-1921 (81 y.o. Female) Treating RN: Primary Care Physician: Terance Hart, DAVID Other Clinician: Referring Physician: Terance Hart, DAVID Treating Physician/Extender: Rudene Re in Treatment: 28 Subjective Chief Complaint Information obtained from Patient Patient presents to the wound care center for a consult due non healing wound. 81 year old patient with comes with a history of a ulcerated area to the right third toe and the right heel which she's had for about 2 months. History of Present Illness (HPI) The following HPI elements were documented for the patient's wound: Location: right medial heel and right third toe Quality: Patient reports experiencing a dull pain to affected  area(s). Severity: Patient states wound are getting worse. Duration: Patient has had the wound for > 3 months prior to seeking treatment at the wound center Timing: Pain in wound is Intermittent (comes and goes Context: The wound appeared gradually over time Modifying Factors: Consults to this date include:vascular surgeon and several recent surgeries. Associated Signs and Symptoms: Patient reports having foul odor and drainage from the left below-knee amputation site. A pleasant 81 year old patient who is known to have diabetes mellitus for several years has recently gone through a series of operations with the vascular surgeon Dr. Gilda Crease. In January and February she had several surgeries on the left lower extremity with an attempt to have limb salvage for gangrenous changes of her forefoot. 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 On 01/07/2015 she also had a right lower extremity distal runoff with a angioplasty of the right anterior tibial artery to maximize her blood flow to the foot. She recently had her staples removed at Dr. Marijean Heath office on 01/31/2015 and at that time a right lower extremity duplex was done which showed patent vessels and a patent stent with distal occluded posterior tibial artery. Though the duplex was noncritical the recommendations from the PA at the vascular surgery office was that of a angiogram to be done but the patient said she would rather try some bone care before. The patient has received doxycycline and Cipro in the recent past and she takes oral medications for her diabetes. Other than that I reviewed her list of all her medications. No recent hemoglobin A1c has been done and no recent x-rays of the right foot have been done. 02/18/2015 -- x-ray of the right foot was done on 02/11/2015 and it shows no evidence of acute osteomyelitis of the third toe or of the  calcaneus. She had gone to the vascular surgery office and they had noted that there is dehiscence of the left part of the amputation site of the below-knee wound and they have asked [place] to kindly take over the care of this. There've been doing dressings for the right heel and right third toe. CADEN, FATICA ([id number]) 02/25/2015 -- we have received notes from the vascular group who saw her last on 02/13/2015 and she was seen by the PA Ms. Cleda Daub. She had recommended that the patient continue to follow with [place] for wound care including management of the left BKA stump, which has had dehiscence of the lateral  part. They will consider repeating a arterial duplex or angiogram if the right lower extremity does not heal within a reasonable period of time. 03/18/2015 -- he saw the vascular surgeon Dr. Gilda Crease and he has set her up for an angioplasty sometime in the middle of July. she is doing well otherwise. 04/10/2015 -- the patient had a procedure done on 04/08/2015 and this was a right angioplasty of the dorsalis pedis, anterior tibial artery, first superficial femoral artery in its midportion. There was successful intervention with recanalization and in-line flow to the right foot. 04/22/2015 -- the patient was looking rather pale today and the daughter confirms that her hemoglobin was down to 6.9. she is being monitored by her PCP. Her Lasix dose was increased and her edema has gone down significantly. 04/29/2015 -- she had vascular studies done and the ABI on the right is 1.3 and the toe pressures were within normal limits. Her hemoglobin is still around 7 and she refuses to take any blood transfusions for religious reasons. Her potassium was normal. 06/10/2015 -- she has a new blister on her right lateral and posterior part of her leg just in the region where the Kerlix bandages were applied and this may be due to an abrasion. 06/24/2015 -- On her right lower extremity she has  developed several more blisters which look like small pustules and the drain informed shallow ulcerations. 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 07/01/2015 -- she has an appointment with the PCP this Friday and her vascular surgeon on Monday. Other than that she is on doxycycline and has finished 1 week of treatment. The pustules she had all over have now resolved. 07/08/2015 -- she saw the vascular surgeon who did studies in the office and found that there is poor circulation in the distal lower right leg and has set her up for an angiogram and possible stenting next Tuesday. 07/22/2015 -- on October 18 she was taken up for a successful revascularization of the anterior tibial vessel on the right side. a PTA and stent placement was done to the right anterior tibial artery. 08/19/2015 -- she has broken out with some linear ulceration in the region of her webspaces of her toes and this is something new. 08/26/2015 -- over the last 3 days there was a streak of redness going from her lower extremity towards her toes but she had no fever or discharge from the wounds. Objective Constitutional Pulse regular. Respirations normal and unlabored. Afebrile. Vitals Time Taken: 8:14 AM, Height: 62 in, Weight: 155 lbs, BMI: 28.3, Temperature: 98.2 F, Pulse: 54 [place], Destony ([id number]) bpm, Respiratory Rate: 18 breaths/min, Blood Pressure: 110/54 mmHg. Eyes Nonicteric. Reactive to light. Ears, Nose, Mouth, and Throat Lips, teeth, and gums WNL.Marland Kitchen Moist mucosa without lesions . Neck supple and nontender. No palpable supraclavicular or cervical adenopathy. Normal sized without goiter. Respiratory WNL. No retractions.. Cardiovascular Pedal Pulses WNL. No clubbing, cyanosis or edema. Lymphatic No adneopathy. No adenopathy. No adenopathy. Musculoskeletal Adexa without tenderness or enlargement.. Digits and nails w/o clubbing, cyanosis, infection, petechiae, ischemia, or inflammatory  conditions.Marland Kitchen Psychiatric Judgement and insight Intact.. No evidence of depression, anxiety, or agitation.. General Notes: the wounds in the right lower extremity have some slough and this was sharply debrided with a curette. The medial heel is looking cleaner and the areas around her toes are much better. She has minimal cellulitis in the mid shin area and also the forefoot. No purulent drainage or warmth. Integumentary (Hair, Skin) No suspicious lesions. No crepitus or  fluctuance. No peri-wound warmth or erythema. No masses.. Wound #1 status is Open. Original cause of wound was Gradually Appeared. The wound is located on the Right Calcaneous. The wound measures 0.2cm length x 0.7cm width x 0.1cm depth; 0.11cm^2 area and 0.011cm^3 volume. The wound is limited to skin breakdown. There is no tunneling or undermining noted. There is a medium amount of serosanguineous drainage noted. The wound margin is distinct with the outline attached to the wound base. There is medium (34-66%) pink, pale granulation within the wound bed. There is a medium (34-66%) amount of necrotic tissue within the wound bed. The periwound skin appearance exhibited: Dry/Scaly. The periwound skin appearance did not exhibit: Callus, Crepitus, Excoriation, Fluctuance, Friable, Induration, Localized Edema, Rash, Scarring, Maceration, Moist, Atrophie Blanche, Cyanosis, Ecchymosis, Hemosiderin Staining, Mottled, Pallor, Rubor, Erythema. The periwound has tenderness on palpation. Wound #6 status is Open. Original cause of wound was Blister. The wound is located on the Right,Posterior Lower Leg. The wound measures 2.3cm length x 2cm width x 0.2cm depth; 3.613cm^2 area and 0.723cm^3 volume. The wound is limited to skin breakdown. There is no tunneling or undermining noted. There is a none present amount of drainage noted. The wound margin is flat and intact. There is medium Libbey, Chrystel ([id number]) (34-66%) pink granulation within  the wound bed. There is a medium (34-66%) amount of necrotic tissue within the wound bed including Adherent Slough. The periwound skin appearance did not exhibit: Callus, Crepitus, Excoriation, Fluctuance, Friable, Induration, Localized Edema, Rash, Scarring, Dry/Scaly, Maceration, Moist, Atrophie Blanche, Cyanosis, Ecchymosis, Hemosiderin Staining, Mottled, Pallor, Rubor, Erythema. The periwound has tenderness on palpation. 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 I will cover her with doxycycline 100 mg twice a day for 10 days. Dressing changes will be as before with Prisma for the medial heel, Silver alginate rope in between the toes and med ihoney for the lower extremity wounds. 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 There was a Skin/Subcutaneous Tissue Debridement ([id number]) debridement with total area of 4.6 sq cm performed by Evlyn Kanner, MD. with the following instrument(s): Curette to remove Viable and Non-Viable tissue/material including Fibrin/Slough and Subcutaneous after achieving pain control using Lidocaine 4% Topical Solution. A time out was conducted prior to the start of the procedure. A Minimum amount of bleeding was controlled with Pressure. The  procedure was tolerated well with a pain level of 8 throughout and a pain level of 0 following the procedure. Post Debridement Measurements: 2.3cm length x 2cm width x 0.2cm depth; 0.723cm^3 volume. Post procedure Diagnosis Wound #6: Same as Pre-Procedure Elgersma, Uldine ([id number]) Plan Wound Cleansing: Wound #1 Right Calcaneous: Cleanse wound with mild soap and water May Shower, gently pat wound dry prior to applying new dressing. Wound #6 Right,Posterior Lower Leg: Cleanse wound with mild soap and water May  Shower, gently pat wound dry prior to applying new dressing. Anesthetic: Wound #1 Right Calcaneous: Topical Lidocaine 4% cream applied to wound bed prior to debridement Wound #6 Right,Posterior Lower Leg: Topical Lidocaine 4% cream applied to wound bed prior to debridement Primary Wound Dressing: Other: - Aquacel Ag rope between toes Wound #1 Right Calcaneous: Prisma Ag Wound #6 Right,Posterior Lower Leg: Medihoney gel Secondary Dressing: Wound #1 Right Calcaneous: ABD pad Gauze and Kerlix/Conform Wound #6 Right,Posterior Lower Leg: ABD pad Gauze and Kerlix/Conform Dressing Change Frequency: Wound #1 Right Calcaneous: Change dressing every other day. Wound #6 Right,Posterior Lower Leg: Change dressing every other day. Follow-up Appointments: Wound #1 Right Calcaneous: Return Appointment in 2 weeks. Wound #6 Right,Posterior Lower Leg: Return Appointment in 2 weeks. Home Health: Wound #1 Right Calcaneous: [facility] may visit PR[facility] to address patient s wound care needs. FACE TO FACE ENCOUNTER: MEDICARE and MEDICAID PATIENTS: I certify that this patient is under my care and that I had a face-to-face encounter that meets the physician face-to-face encounter requirements with this patient on this date. The encounter with the patient was in whole or in part for the following MEDICAL CONDITION: (primary reason for Home  Healthcare) MEDICAL NECESSITY: I certify, that based on my findings, NURSING services are a medically necessary home health service. HOME BOUND STATUS: I certify that my clinical findings support that this patient is homebound (i.e., Due to [place] ([id number]) illness or injury, pt requires aid of supportive devices such as crutches, cane, wheelchairs, walkers, the use of special transportation or the assistance of another person to leave their place of residence. There is a normal inability to leave the home and doing so requires considerable and taxing effort. Other absences are for medical reasons / religious services and are infrequent or of short duration when for other reasons). If current dressing causes regression in wound condition, may D/C ordered dressing product/s and apply Normal Saline Moist Dressing daily until next Wound Healing Center / Other MD appointment. Notify Wound Healing Center of regression in wound condition at [phone]. Please direct any NON-WOUND related issues/requests for orders to patient's Primary Care Physician Wound #6 Right,Posterior Lower Leg: [facility] may visit PRN to address patient s wound care needs. FACE TO FACE ENCOUNTER: MEDICARE and MEDICAID PATIENTS: I certify that this patient is under my care and that I had a face-to-face encounter that meets the physician face-to-face encounter requirements with this patient on this date. The encounter with the patient was in whole or in part for the following MEDICAL CONDITION: (primary reason for Home Healthcare) MEDICAL NECESSITY: I certify, that based on my findings, NURSING services are a medically necessary home health service. HOME BOUND STATUS: I certify that my clinical findings support that this patient is homebound (i.e., Due to illness or injury, pt requires aid of supportive devices such as crutches, cane, wheelchairs, walkers, the use of  special transportation or the assistance of another person to leave their place of residence. There is a normal inability to leave the home and doing so requires considerable and taxing effort. Other absences are for medical reasons / religious services and are infrequent or of short duration when for other reasons). If current dressing causes regression in wound condition, may D/C ordered dressing product/s and apply Normal Saline Moist Dressing daily until next Wound Healing Center / Other MD appointment. Notify Wound Healing Center of regression in wound condition  at [phone]. Please direct any NON-WOUND related issues/requests for orders to patient's Primary Care Physician The following medication(s) was prescribed: doxycycline hyclate oral 100 mg capsule 1 1 capsule oral BID starting 08/26/2015 She has a kitten who quite often scratches her and I believe she may have caught an infection from this. I will cover her with doxycycline 100 mg twice a day for 10 days. Dressing changes will be as before with Prisma for the medial heel, Silver alginate rope in between the toes and med ihoney for the lower extremity wounds. Management discussed with her caregiver and she will be back to see me next week Electronic Signature(s) Signed: 08/26/2015 8:54:47 AM By: Evlyn Kanner MD, FACS Entered By: Evlyn Kanner on 08/26/2015 08:54:47 [name] ([id number]) -------------------------------------------------------------------------------- SuperBill Details Patient Name: [name] Date of Service: 08/26/2015 Medical Record Number: [id number] Patient Account Number: [id number] Date of Birth/Sex: Apr 05, 1921 (81 y.o. 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 L97.812 Non-pressure chronic ulcer of other part of right lower leg with fat layer exposed Disruption of external operation (surgical) wound, not elsewhere classified, initial T81.31XA encounter Facility Procedures CPT4: Description Modifier Quantity Code [id number] 11042 - DEB SUBQ TISSUE 20 SQ CM/< 1 ICD-10 Description Diagnosis E11.621 Type 2 diabetes mellitus with foot ulcer I70.235 Atherosclerosis of native arteries of right leg with ulceration of other part of  foot 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 L97.812 Non-pressure chronic ulcer of other part of right lower leg with fat layer exposed Physician Procedures CPT4: Description Modifier Quantity Code [id number] 11042 - WC PHYS SUBQ TISS 20 SQ CM 1 ICD-10 Description Diagnosis E11.621 Type 2 diabetes mellitus with foot ulcer I70.235 Atherosclerosis of native arteries of right leg with ulceration of other part of  foot 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

## 2015-08-27 NOTE — Progress Notes (Signed)
[name], [name] ([id number]) Visit Report for 08/26/2015 Arrival Information Details Patient Name: [name], [name] Date of Service: 08/26/2015 8:00 AM Medical Record Number: [id number] Patient Account Number: [id number] Date of Birth/Sex: Jun 26, 1921 (81 y.o. Female) Treating RN: Curtis Sites Primary Care Physician: Dorothey Baseman Other Clinician: Referring Physician: Dorothey Baseman Treating Physician/Extender: Rudene Re in Treatment: 28 Visit Information History Since Last Visit Added or deleted any medications: No Patient Arrived: Wheel Chair Any new allergies or adverse reactions: No Arrival Time: 08:10 Had a fall or experienced change in No activities of daily living that may affect Accompanied By: dtr risk of falls: Transfer Assistance: Manual Signs or symptoms of abuse/neglect since last No Patient Identification Verified: Yes visito Secondary Verification Process Yes Hospitalized since last visit: No Completed: Pain Present Now: Yes Patient Has Alerts: Yes Patient Alerts: DMII Electronic Signature(s) Signed: 08/26/2015 5:43:56 PM By: Curtis Sites Entered By: Curtis Sites on 08/26/2015 08:13:46 [name] ([id number]) -------------------------------------------------------------------------------- Encounter Discharge Information Details Patient Name: [name] Date of Service: 08/26/2015 8:00 AM Medical Record Number: [id number] Patient Account Number: [id number] Date of Birth/Sex: Jan 17, 1921 (81 y.o. Female) Treating RN: Curtis Sites Primary Care Physician: Dorothey Baseman Other Clinician: Referring Physician: Dorothey Baseman Treating Physician/Extender: Rudene Re in Treatment: 28 Encounter Discharge Information Items Discharge Pain Level: 0 Discharge Condition: Stable Ambulatory Status: Wheelchair Discharge Destination: Home Transportation: Private Auto Accompanied By: dtr Schedule Follow-up Appointment: Yes Medication  Reconciliation completed and provided to Patient/Care No Averill Winters: Provided on Clinical Summary of Care: 08/26/2015 Form Type Recipient Paper Patient HS Electronic Signature(s) Signed: 08/26/2015 9:04:46 AM By: Gwenlyn Perking Entered By: Gwenlyn Perking on 08/26/2015 09:04:45 [name] ([id number]) -------------------------------------------------------------------------------- Lower Extremity Assessment Details Patient Name: [name] Date of Service: 08/26/2015 8:00 AM Medical Record Number: [id number] Patient Account Number: [id number] Date of Birth/Sex: [birth year] (81 y.o. Female) Treating RN: Curtis Sites Primary Care Physician: Dorothey Baseman Other Clinician: Referring Physician: Dorothey Baseman Treating Physician/Extender: Rudene Re in Treatment: 28 Edema Assessment Assessed: [[name]: No] [Right: No] Edema: [[name]: Ye] [Right: s] Vascular Assessment Pulses: Posterior Tibial Dorsalis Pedis Palpable: [Right:Yes] Doppler: [Right:Monophasic] Extremity colors, hair growth, and conditions: Extremity Color: [Right:Pale] Hair Growth on Extremity: [Right:No] Temperature of Extremity: [Right:Cool] Capillary Refill: [Right:< 3 seconds] Toe Nail Assessment [name]: Right: Thick: Yes Discolored: Yes Deformed: No Improper Length and Hygiene: No Electronic Signature(s) Signed: 08/26/2015 5:43:56 PM By: Curtis Sites Entered By: Curtis Sites on 08/26/2015 08:30:51 [name] ([id number]) -------------------------------------------------------------------------------- Multi Wound Chart Details Patient Name: [name] Date of Service: 08/26/2015 8:00 AM Medical Record Number: [id number] Patient Account Number: [id number] Date of Birth/Sex: 1921/07/27 (81 y.o. 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 N/A] Comorbid History: [1:Arrhythmia, Deep Vein Thrombosis, Peripheral Arterial Disease, Type II Arterial Disease, Type II Diabetes] [6:Arrhythmia, Deep Vein Thrombosis, Peripheral Diabetes] [N/A:N/A] Date Acquired: [1:11/04/2014] [6:06/12/2015] [N/A:N/A] Weeks of Treatment: [1:28] [6:10] [N/A:N/A] Wound Status: [1:Open] [6:Open] [N/A:N/A] Measurements L x W x D 0.2x0.7x0.1 [6:2.3x2x0.2] [N/A:N/A] (cm) Area (cm) : [1:0.11] [6:3.613] [N/A:N/A] Volume (cm) : [1:0.011] [6:0.723] [N/A:N/A] % Reduction in Area: [1:98.00%] [6:-360.30%] [N/A:N/A] % Reduction in Volume: 98.00% [6:-815.20%] [N/A:N/A] Classification: [  1:Full Thickness Without Exposed Support Structures] [6:Full Thickness Without Exposed Support Structures] [N/A:N/A] HBO Classification: [1:Grade 1] [6:Grade 1] [N/A:N/A] Exudate Amount: [1:Medium] [6:None Present] [N/A:N/A] Exudate Type: [1:Serosanguineous] [6:N/A] [N/A:N/A] Exudate Color: [1:red, brown] [6:N/A] [N/A:N/A] Wound Margin: [1:Distinct, outline attached Flat and Intact] [N/A:N/A] Granulation Amount: [1:Medium (34-66%)] [6:Medium (34-66%)] [N/A:N/A] Granulation Quality: [1:Pink, Pale] [6:Pink] [N/A:N/A] Necrotic Amount: [1:Medium (34-66%)] [6:Medium (34-66%)] [N/A:N/A] Exposed Structures: [N/A:N/A] Fascia: No Fascia: No Fat: No Fat: No Tendon: No Tendon: No Muscle: No Muscle: No Joint: No Joint: No Bone: No Bone: No Limited to Skin Limited to Skin Breakdown Breakdown Epithelialization: Small (1-33%) None  N/A Periwound Skin Texture: Edema: No Edema: No N/A Excoriation: No Excoriation: No Induration: No Induration: No Callus: No Callus: No Crepitus: No Crepitus: No Fluctuance: No Fluctuance: No Friable: No Friable: No Rash: No Rash: No Scarring: No Scarring: No Periwound Skin Dry/Scaly: Yes Maceration: No N/A Moisture: Maceration: No Moist: No Moist: No Dry/Scaly: No Periwound Skin Color: Atrophie Blanche: No Atrophie Blanche: No N/A Cyanosis: No Cyanosis: No Ecchymosis: No Ecchymosis: No Erythema: No Erythema: No Hemosiderin Staining: No Hemosiderin Staining: No Mottled: No Mottled: No Pallor: No Pallor: No Rubor: No Rubor: No Tenderness on Yes Yes N/A Palpation: Wound Preparation: Ulcer Cleansing: Ulcer Cleansing: N/A Rinsed/Irrigated with Rinsed/Irrigated with Saline Saline Topical Anesthetic Topical Anesthetic Applied: Other: lidocaine Applied: Other: lidocaine 4% 4% Treatment Notes Electronic Signature(s) Signed: 08/26/2015 5:43:56 PM By: Curtis Sites Entered By: Curtis Sites on 08/26/2015 08:32:02 [name] ([id number]) -------------------------------------------------------------------------------- Multi-Disciplinary Care Plan Details Patient Name: [name] Date of Service: 08/26/2015 8:00 AM Medical Record Number: [id number] Patient Account Number: [id number] Date of Birth/Sex: 04/27/1921 (81 y.o. Female) Treating RN: Curtis Sites Primary Care Physician: Dorothey Baseman Other Clinician: Referring Physician: Dorothey Baseman Treating Physician/Extender: Rudene Re in Treatment: 27 Active Inactive Abuse / Safety / Falls / Self Care Management Nursing Diagnoses: Impaired physical mobility Potential for falls Goals: Patient will remain injury free Date Initiated: 02/11/2015 Goal Status: Active Patient/caregiver will verbalize understanding of skin care regimen Date Initiated: 02/11/2015 Goal Status:  Active Patient/caregiver will verbalize/demonstrate measures taken to prevent injury and/or falls Date Initiated: 02/11/2015 Goal Status: Active Patient/caregiver will verbalize/demonstrate understanding of what to do in case of emergency Date Initiated: 02/11/2015 Goal Status: Active Interventions: Assess fall risk on admission and as needed Provide education on fall prevention Provide education on safe transfers Treatment Activities: Patient referred to home care : 08/26/2015 Notes: Nutrition Nursing Diagnoses: Imbalanced nutrition Potential for alteratiion in Nutrition/Potential for imbalanced nutrition [name], [name] ([id number]) Goals: Patient/caregiver verbalizes understanding of need to maintain therapeutic glucose control per primary care physician Date Initiated: 02/11/2015 Goal Status: Active Patient/caregiver will maintain therapeutic glucose control Date Initiated: 02/11/2015 Goal Status: Active Interventions: Assess HgA1c results as ordered upon admission and as needed Provide education on elevated blood sugars and impact on wound healing Provide education on nutrition Treatment Activities: Education provided on Nutrition : 04/22/2015 Obtain HgA1c : 08/26/2015 Notes: Wound/Skin Impairment Nursing Diagnoses: Impaired tissue integrity Knowledge deficit related to ulceration/compromised skin integrity Goals: Patient/caregiver will verbalize understanding of skin care regimen Date Initiated: 02/11/2015 Goal Status: Active Ulcer/skin breakdown will heal within 14 weeks Date Initiated: 02/11/2015 Goal Status: Active Interventions: Assess patient/caregiver ability to obtain necessary supplies Assess patient/caregiver ability to perform ulcer/skin care regimen upon admission and as needed Assess ulceration(s) every visit Provide education on ulcer and skin care Treatment Activities: Skin care regimen initiated : 08/26/2015 Topical wound management initiated :  08/26/2015 Notes: [name], [name] ([id number]) Electronic Signature(s)  Signed: 08/26/2015 5:43:56 PM By: Curtis Sites Entered By: Curtis Sites on 08/26/2015 08:31:55 [name] ([id number]) -------------------------------------------------------------------------------- Pain Assessment Details Patient Name: [name] Date of Service: 08/26/2015 8:00 AM Medical Record Number: [id number] Patient Account Number: [id number] Date of Birth/Sex: 1921/06/22 (81 y.o. Female) Treating RN: Curtis Sites Primary Care Physician: Dorothey Baseman Other Clinician: Referring Physician: Dorothey Baseman Treating Physician/Extender: Rudene Re in Treatment: 28 Active Problems Location of Pain Severity and Description of Pain Patient Has Paino Yes Site Locations Pain Location: Pain in Ulcers With Dressing Change: Yes Duration of the Pain. Constant / Intermittento Constant Pain Management and Medication Current Pain Management: Electronic Signature(s) Signed: 08/26/2015 5:43:56 PM By: Curtis Sites Entered By: Curtis Sites on 08/26/2015 08:14:19 [name] ([id number]) -------------------------------------------------------------------------------- Patient/Caregiver Education Details Patient Name: [name] Date of Service: 08/26/2015 8:00 AM Medical Record Number: [id number] Patient Account Number: [id number] Date of Birth/Gender: 04-15-1921 (81 y.o. Female) Treating RN: Curtis Sites Primary Care Physician: Dorothey Baseman Other Clinician: Referring Physician: Dorothey Baseman Treating Physician/Extender: Rudene Re in Treatment: 5 Education Assessment Education Provided To: Patient and Caregiver Education Topics Provided Wound/Skin Impairment: Handouts: Other: wound care as ordered Methods: Demonstration, Explain/Verbal Responses: State content correctly Electronic Signature(s) Signed: 08/26/2015 5:43:56 PM By: Curtis Sites Entered  By: Curtis Sites on 08/26/2015 09:02:38 [name] ([id number]) -------------------------------------------------------------------------------- Wound Assessment Details Patient Name: [name] Date of Service: 08/26/2015 8:00 AM Medical Record Number: [id number] Patient Account Number: [id number] Date of Birth/Sex: [birth year] (81 y.o. 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 Wound Open Wounding Event: Gradually Appeared Status: Date Acquired: 11/04/2014 Comorbid Arrhythmia, Deep Vein Thrombosis, Weeks Of Treatment: 28 History: Peripheral Arterial Disease, Type II Clustered Wound: No Diabetes Photos Photo Uploaded By: Curtis Sites on 08/26/2015 09:14:07 Wound Measurements Length: (cm) 0.2 Width: (cm) 0.7 Depth: (cm) 0.1 Area: (cm) 0.11 Volume: (cm) 0.011 % Reduction in Area: 98% % Reduction in Volume: 98% Epithelialization: Small (1-33%) Tunneling: No Undermining: No Wound Description Full Thickness Without Exposed Classification: Support Structures Diabetic Severity Grade 1 (Wagner): Wound Margin: Distinct, outline attached Exudate Amount: Medium Exudate Type: Serosanguineous Exudate Color: red, brown Foul Odor After Cleansing: No Wound Bed Granulation Amount: Medium (34-66%) Exposed Structure [name], [name] ([id number]) Granulation Quality: Pink, Pale Fascia Exposed: No Necrotic Amount: Medium (34-66%) Fat Layer Exposed: No Tendon Exposed: No Muscle Exposed: No Joint Exposed: No Bone Exposed: No Limited to Skin Breakdown Periwound Skin Texture Texture Color No Abnormalities Noted: No No Abnormalities Noted: No Callus: No Atrophie Blanche: No Crepitus: No Cyanosis:  No Excoriation: No Ecchymosis: No Fluctuance: No Erythema: No Friable: No Hemosiderin Staining: No Induration: No Mottled: No Localized Edema: No Pallor: No Rash: No Rubor: No Scarring: No Temperature / Pain Moisture Tenderness on Palpation: Yes No Abnormalities Noted: No Dry / Scaly: Yes Maceration: No Moist: No Wound Preparation Ulcer Cleansing: Rinsed/Irrigated with Saline Topical Anesthetic Applied: Other: lidocaine 4%, Treatment Notes Wound #1 (Right Calcaneous) 1. Cleansed with: Clean wound with Normal Saline 2. Anesthetic Topical Lidocaine 4% cream to wound bed prior to debridement 4. Dressing Applied: Prisma Ag 5. Secondary Dressing Applied ABD and Kerlix/Conform 7. Secured with Secretary/administrator) Signed: 08/26/2015 5:43:56 PM By: Curtis Sites Entered By: Curtis Sites on 08/26/2015 08:31:33 [name] ([id number]) [name], [name] ([id number]) -------------------------------------------------------------------------------- Wound Assessment Details Patient Name: [name] Date of Service: 08/26/2015  8:00 AM Medical Record Number: [id number] Patient Account Number: [id number] Date of Birth/Sex: 09/22/1921 (81 y.o. Female) Treating RN: Curtis Sitesorthy, Joanna Primary Care Physician: Dorothey BasemanBRONSTEIN, DAVID Other Clinician: Referring Physician: Terance HartBRONSTEIN, DAVID Treating Physician/Extender: Rudene ReBritto, Errol Weeks in Treatment: 28 Wound Status Wound Number: 6 Primary Arterial Insufficiency Ulcer Etiology: Wound Location: Right Lower Leg - Posterior Wound Open Wounding Event: Blister Status: Date Acquired: 06/12/2015 Comorbid Arrhythmia, Deep Vein Thrombosis, Weeks Of Treatment: 10 History: Peripheral Arterial Disease, Type II Clustered Wound: No Diabetes Photos Photo Uploaded By: Curtis Sitesorthy, Joanna on 08/26/2015 09:14:08 Wound Measurements Length: (cm) 2.3 Width: (cm) 2 Depth: (cm) 0.2 Area: (cm) 3.613 Volume: (cm) 0.723 % Reduction in Area:  -360.3% % Reduction in Volume: -815.2% Epithelialization: None Tunneling: No Undermining: No Wound Description Full Thickness Without Foul Odor Aft Classification: Exposed Support Structures Diabetic Severity Grade 1 (Wagner): Wound Margin: Flat and Intact Exudate Amount: None Present er Cleansing: No Wound Bed Granulation Amount: Medium (34-66%) Exposed Structure Granulation Quality: Pink Fascia Exposed: No Necrotic Amount: Medium (34-66%) Fat Layer Exposed: No [name], [name] ([id number]) Necrotic Quality: Adherent Slough Tendon Exposed: No Muscle Exposed: No Joint Exposed: No Bone Exposed: No Limited to Skin Breakdown Periwound Skin Texture Texture Color No Abnormalities Noted: No No Abnormalities Noted: No Callus: No Atrophie Blanche: No Crepitus: No Cyanosis: No Excoriation: No Ecchymosis: No Fluctuance: No Erythema: No Friable: No Hemosiderin Staining: No Induration: No Mottled: No Localized Edema: No Pallor: No Rash: No Rubor: No Scarring: No Temperature / Pain Moisture Tenderness on Palpation: Yes No Abnormalities Noted: No Dry / Scaly: No Maceration: No Moist: No Wound Preparation Ulcer Cleansing: Rinsed/Irrigated with Saline Topical Anesthetic Applied: Other: lidocaine 4%, Treatment Notes Wound #6 (Right, Posterior Lower Leg) 1. Cleansed with: Clean wound with Normal Saline 2. Anesthetic Topical Lidocaine 4% cream to wound bed prior to debridement 4. Dressing Applied: Medihoney Gel 5. Secondary Dressing Applied ABD and Kerlix/Conform 7. Secured with Secretary/administratorTape Electronic Signature(s) Signed: 08/26/2015 5:43:56 PM By: Curtis Sitesorthy, Joanna Entered By: Curtis Sitesorthy, Joanna on 08/26/2015 08:31:46 [name], [name] ([id number]) -------------------------------------------------------------------------------- Vitals Details Patient Name: [name], [name] Date of Service: 08/26/2015 8:00 AM Medical Record Number: [id number] Patient Account Number:  [id number] Date of Birth/Sex: 06/18/1921 (81 y.o. Female) Treating RN: Curtis Sitesorthy, Joanna Primary Care Physician: Dorothey BasemanBRONSTEIN, DAVID Other Clinician: Referring Physician: Terance HartBRONSTEIN, DAVID Treating Physician/Extender: Rudene ReBritto, Errol Weeks in Treatment: 28 Vital Signs Time Taken: 08:14 Temperature (F): 98.2 Height (in): 62 Pulse (bpm): 54 Weight (lbs): 155 Respiratory Rate (breaths/min): 18 Body Mass Index (BMI): 28.3 Blood Pressure (mmHg): 110/54 Reference Range: 80 - 120 mg / dl Electronic Signature(s) Signed: 08/26/2015 5:43:56 PM By: Curtis Sitesorthy, Joanna Entered By: Curtis Sitesorthy, Joanna on 08/26/2015 08:14:45

## 2015-09-02 ENCOUNTER — Encounter: Payer: Medicare Other | Attending: Surgery | Admitting: Surgery

## 2015-09-02 DIAGNOSIS — T8131XA Disruption of external operation (surgical) wound, not elsewhere classified, initial encounter: Secondary | ICD-10-CM | POA: Insufficient documentation

## 2015-09-02 DIAGNOSIS — X58XXXA Exposure to other specified factors, initial encounter: Secondary | ICD-10-CM | POA: Diagnosis not present

## 2015-09-02 DIAGNOSIS — L97812 Non-pressure chronic ulcer of other part of right lower leg with fat layer exposed: Secondary | ICD-10-CM | POA: Diagnosis not present

## 2015-09-02 DIAGNOSIS — L97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: Secondary | ICD-10-CM | POA: Diagnosis not present

## 2015-09-02 DIAGNOSIS — E11621 Type 2 diabetes mellitus with foot ulcer: Secondary | ICD-10-CM | POA: Diagnosis not present

## 2015-09-02 DIAGNOSIS — Z89512 Acquired absence of left leg below knee: Secondary | ICD-10-CM | POA: Insufficient documentation

## 2015-09-02 DIAGNOSIS — I70235 Atherosclerosis of native arteries of right leg with ulceration of other part of foot: Secondary | ICD-10-CM | POA: Insufficient documentation

## 2015-09-02 NOTE — Progress Notes (Signed)
[name] NationsSILETZKY, [name] ([id number]) Visit Report for 09/02/2015 Arrival Information Details Patient Name: [name] NationsSILETZKY, [name] Date of Service: 09/02/2015 10:00 AM Medical Record Number: [id number] Patient Account Number: [id number] Date of Birth/Sex: 12/29/1920 (81 y.o. Female) Treating RN: Afful, RN, BSN, [place] Sinkita Primary Care Physician: Dorothey BasemanBRONSTEIN, DAVID Other Clinician: Referring Physician: Terance HartBRONSTEIN, DAVID Treating Physician/Extender: Rudene ReBritto, Errol Weeks in Treatment: 29 Visit Information History Since Last Visit Added or deleted any medications: No Patient Arrived: Wheel Chair Any new allergies or adverse reactions: No Arrival Time: 10:12 Had a fall or experienced change in No activities of daily living that may affect Accompanied By: dtr in law risk of falls: Transfer Assistance: Manual Signs or symptoms of abuse/neglect since last No Patient Identification Verified: Yes visito Secondary Verification Process Yes Hospitalized since last visit: No Completed: Pain Present Now: Yes Patient Has Alerts: Yes Patient Alerts: DMII Electronic Signature(s) Signed: 09/02/2015 10:13:02 AM By: Elpidio EricAfful, Rita BSN, RN Entered By: Elpidio EricAfful, Rita on 09/02/2015 10:13:02 [name] NationsSILETZKY, [name] ([id number]) -------------------------------------------------------------------------------- Encounter Discharge Information Details Patient Name: [name] NationsSILETZKY, [name] Date of Service: 09/02/2015 10:00 AM Medical Record Number: [id number] Patient Account Number: [id number] Date of Birth/Sex: 06/18/1921 (81 y.o. Female) Treating RN: Afful, RN, BSN, [place] Sinkita Primary Care Physician: Dorothey BasemanBRONSTEIN, DAVID Other Clinician: Referring Physician: Terance HartBRONSTEIN, DAVID Treating Physician/Extender: Rudene ReBritto, Errol Weeks in Treatment: [id number] Encounter Discharge Information Items Discharge Pain Level: 0 Discharge Condition: Stable Ambulatory Status: Wheelchair Discharge Destination: Home Transportation: Private Auto Accompanied By: dtr in law Schedule Follow-up  Appointment: No Medication Reconciliation completed and provided to Patient/Care No Kiernan Farkas: Provided on Clinical Summary of Care: 09/02/2015 Form Type Recipient Paper Patient HS Electronic Signature(s) Signed: 09/02/2015 10:53:21 AM By: Gwenlyn PerkingMoore, Shelia Previous Signature: 09/02/2015 10:52:10 AM Version By: Elpidio EricAfful, Rita BSN, RN Entered By: Gwenlyn PerkingMoore, Shelia on 09/02/2015 10:53:21 [name] NationsSILETZKY, [name] ([id number]) -------------------------------------------------------------------------------- Lower Extremity Assessment Details Patient Name: [name] NationsSILETZKY, [name] Date of Service: 09/02/2015 10:00 AM Medical Record Number: [id number] Patient Account Number: [id number] Date of Birth/Sex: 02/22/1921 (81 y.o. Female) Treating RN: Afful, RN, BSN, [place] Sinkita Primary Care Physician: Dorothey BasemanBRONSTEIN, DAVID Other Clinician: Referring Physician: Terance HartBRONSTEIN, DAVID Treating Physician/Extender: Rudene ReBritto, Errol Weeks in Treatment: 29 Vascular Assessment Pulses: Posterior Tibial Dorsalis Pedis Palpable: [Right:No] Doppler: [Right:Monophasic] Extremity colors, hair growth, and conditions: Extremity Color: [Right:Normal] Hair Growth on Extremity: [Right:No] Temperature of Extremity: [Right:Warm] Capillary Refill: [Right:< 3 seconds] Toe Nail Assessment Left: Right: Thick: Yes Discolored: Yes Deformed: No Improper Length and Hygiene: No Electronic Signature(s) Signed: 09/02/2015 10:20:52 AM By: Elpidio EricAfful, Rita BSN, RN Entered By: Elpidio EricAfful, Rita on 09/02/2015 10:20:52 [name] NationsSILETZKY, [name] ([id number]) -------------------------------------------------------------------------------- Multi Wound Chart Details Patient Name: [name] NationsSILETZKY, [name] Date of Service: 09/02/2015 10:00 AM Medical Record Number: [id number] Patient Account Number: [id number] Date of Birth/Sex: 09/02/1921 (81 y.o. Female) Treating RN: Clover MealyAfful, RN, BSN, [place] Sinkita Primary Care Physician: Dorothey BasemanBRONSTEIN, DAVID Other Clinician: Referring Physician: Terance HartBRONSTEIN, DAVID Treating  Physician/Extender: Rudene ReBritto, Errol Weeks in Treatment: 29 Vital Signs Height(in): 62 Pulse(bpm): 55 Weight(lbs): 155 Blood Pressure 132/42 (mmHg): Body Mass Index(BMI): 28 Temperature(F): 98.1 Respiratory Rate 18 (breaths/min): Photos: [1:No Photos] [10:No Photos] [6:No Photos] Wound Location: [1:Right Calcaneous] [10:Right Lower Leg - Anterior Right Lower Leg -] [6:Posterior] Wounding Event: [1:Gradually Appeared] [10:Gradually Appeared] [6:Blister] 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] Comorbid History: [1:Arrhythmia, Deep Vein Thrombosis, Peripheral Arterial Disease, Type II Arterial Disease, Type II Arterial Disease, Type II Diabetes] [10:Arrhythmia, Deep Vein Thrombosis, Peripheral Diabetes] [6:Arrhythmia, Deep Vein Thrombosis,  Peripheral Diabetes] Date Acquired: [1:11/04/2014] [10:08/31/2015] [6:06/12/2015] Weeks of Treatment: [1:29] [10:0] [6:11] Wound Status: [1:Open] [10:Open] [6:Open] Measurements  L x W x D 0.5x1.5x0.1 [10:0.5x0.5x0.5] [6:2x1.5x0.2] (cm) Area (cm) : [1:0.589] [10:0.196] [6:2.356] Volume (cm) : [1:0.059] [10:0.098] [6:0.471] % Reduction in Area: [1:89.30%] [10:0.00%] [6:-200.10%] % Reduction in Volume: 89.30% [10:0.00%] [6:-496.20%] Classification: [1:Full Thickness Without Exposed Support Structures] [10:Grade 1] [6:Full Thickness Without Exposed Support Structures] HBO Classification: [1:Grade 1] [10:N/A] [6:Grade 1] Exudate Amount: [1:Medium] [10:Medium] [6:Medium] Exudate Type: [1:Serosanguineous] [10:Serous] [6:Serosanguineous] Exudate Color: [1:red, brown] [10:amber] [6:red, brown] Wound Margin: [1:Distinct, outline attached Distinct, outline attached Flat and Intact] Granulation Amount: [1:Medium (34-66%)] [10:Large (67-100%)] [6:Medium (34-66%)] Granulation Quality: [1:Pink, Pale] [10:Pink, Pale] [6:Pink] Necrotic Amount: [1:Medium (34-66%)] [10:Small  (1-33%)] [6:Medium (34-66%)] Exposed Structures: Fascia: No Fascia: No Fascia: No Fat: No Fat: No Fat: No Tendon: No Tendon: No Tendon: No Muscle: No Muscle: No Muscle: No Joint: No Joint: No Joint: No Bone: No Bone: No Bone: No Limited to Skin Limited to Skin Limited to Skin Breakdown Breakdown Breakdown Epithelialization: Small (1-33%) None None Periwound Skin Texture: Edema: Yes Edema: Yes Edema: Yes Excoriation: No Excoriation: No Excoriation: No Induration: No Induration: No Induration: No Callus: No Callus: No Callus: No Crepitus: No Crepitus: No Crepitus: No Fluctuance: No Fluctuance: No Fluctuance: No Friable: No Friable: No Friable: No Rash: No Rash: No Rash: No Scarring: No Scarring: No Scarring: No Periwound Skin Moist: Yes Moist: Yes Moist: Yes Moisture: Maceration: No Maceration: No Maceration: No Dry/Scaly: No Dry/Scaly: No Dry/Scaly: No Periwound Skin Color: Atrophie Blanche: No Atrophie Blanche: No Atrophie Blanche: No Cyanosis: No Cyanosis: No Cyanosis: No Ecchymosis: No Ecchymosis: No Ecchymosis: No Erythema: No Erythema: No Erythema: No Hemosiderin Staining: No Hemosiderin Staining: No Hemosiderin Staining: No Mottled: No Mottled: No Mottled: No Pallor: No Pallor: No Pallor: No Rubor: No Rubor: No Rubor: No Temperature: N/A No Abnormality N/A Tenderness on Yes Yes Yes Palpation: Wound Preparation: Ulcer Cleansing: Ulcer Cleansing: Ulcer Cleansing: Rinsed/Irrigated with Rinsed/Irrigated with Rinsed/Irrigated with Saline Saline Saline Topical Anesthetic Topical Anesthetic Topical Anesthetic Applied: Other: lidocaine Applied: Other: lidocaine Applied: Other: lidocaine 4% 4% 4% Wound Number: 9 N/A N/A Photos: No Photos N/A N/A Wound Location: Right Lower Leg - Anterior N/A N/A Wounding Event: Gradually Appeared N/A N/A Primary Etiology: Diabetic Wound/Ulcer of N/A N/A the Lower Extremity Comorbid History:  Arrhythmia, Deep Vein N/A N/A Thrombosis, Peripheral Arterial Disease, Type II Diabetes Laneve, Kynnadi ([id number]) Date Acquired: 08/31/2015 N/A N/A Weeks of Treatment: 0 N/A N/A Wound Status: Open N/A N/A Measurements L x W x D 0.8x0.5x0.2 N/A N/A (cm) Area (cm) : 0.314 N/A N/A Volume (cm) : 0.063 N/A N/A % Reduction in Area: 0.00% N/A N/A % Reduction in Volume: 0.00% N/A N/A Classification: Grade 1 N/A N/A HBO Classification: N/A N/A N/A Exudate Amount: Medium N/A N/A Exudate Type: Serosanguineous N/A N/A Exudate Color: red, brown N/A N/A Wound Margin: Distinct, outline attached N/A N/A Granulation Amount: Medium (34-66%) N/A N/A Granulation Quality: Pink, Pale N/A N/A Necrotic Amount: Small (1-33%) N/A N/A Exposed Structures: Fascia: No N/A N/A Fat: No Tendon: No Muscle: No Joint: No Bone: No Limited to Skin Breakdown Epithelialization: None N/A N/A Periwound Skin Texture: Edema: Yes N/A N/A Periwound Skin Moist: Yes N/A N/A Moisture: Periwound Skin Color: No Abnormalities Noted N/A N/A Temperature: No Abnormality N/A N/A Tenderness on Yes N/A N/A Palpation: Wound Preparation: Ulcer Cleansing: N/A N/A Rinsed/Irrigated with Saline Topical Anesthetic Applied: Other: lidocaine 4% Treatment Notes Electronic Signature(s) Signed: 09/02/2015 10:38:15 AM By: Elpidio Eric BSN, RN Entered By: Elpidio Eric on 09/02/2015 10:38:15 [name] ([id number]) [name], [name] ([id number]) --------------------------------------------------------------------------------  Multi-Disciplinary Care Plan Details Patient Name: [name], [name] Date of Service: 09/02/2015 10:00 AM Medical Record Number: [id number] Patient Account Number: [id number] Date of Birth/Sex: 1921-05-04 (81 y.o. Female) Treating RN: Afful, RN, BSN, [place] Sink Primary Care Physician: Terance Hart, DAVID Other Clinician: Referring Physician: Terance Hart, DAVID Treating Physician/Extender: Rudene Re in  Treatment: 47 Active Inactive Abuse / Safety / Falls / Self Care Management Nursing Diagnoses: Impaired physical mobility Potential for falls Goals: Patient will remain injury free Date Initiated: 02/11/2015 Goal Status: Active Patient/caregiver will verbalize understanding of skin care regimen Date Initiated: 02/11/2015 Goal Status: Active Patient/caregiver will verbalize/demonstrate measures taken to prevent injury and/or falls Date Initiated: 02/11/2015 Goal Status: Active Patient/caregiver will verbalize/demonstrate understanding of what to do in case of emergency Date Initiated: 02/11/2015 Goal Status: Active Interventions: Assess fall risk on admission and as needed Provide education on fall prevention Provide education on safe transfers Treatment Activities: Patient referred to home care : 09/02/2015 Notes: Nutrition Nursing Diagnoses: Imbalanced nutrition Potential for alteratiion in Nutrition/Potential for imbalanced nutrition Mier, Jalie ([id number]) Goals: Patient/caregiver verbalizes understanding of need to maintain therapeutic glucose control per primary care physician Date Initiated: 02/11/2015 Goal Status: Active Patient/caregiver will maintain therapeutic glucose control Date Initiated: 02/11/2015 Goal Status: Active Interventions: Assess HgA1c results as ordered upon admission and as needed Provide education on elevated blood sugars and impact on wound healing Provide education on nutrition Treatment Activities: Education provided on Nutrition : 04/22/2015 Obtain HgA1c : 09/02/2015 Notes: Wound/Skin Impairment Nursing Diagnoses: Impaired tissue integrity Knowledge deficit related to ulceration/compromised skin integrity Goals: Patient/caregiver will verbalize understanding of skin care regimen Date Initiated: 02/11/2015 Goal Status: Active Ulcer/skin breakdown will heal within 14 weeks Date Initiated: 02/11/2015 Goal Status:  Active Interventions: Assess patient/caregiver ability to obtain necessary supplies Assess patient/caregiver ability to perform ulcer/skin care regimen upon admission and as needed Assess ulceration(s) every visit Provide education on ulcer and skin care Treatment Activities: Skin care regimen initiated : 09/02/2015 Topical wound management initiated : 09/02/2015 Notes: [name], [name] ([id number]) Electronic Signature(s) Signed: 09/02/2015 10:38:05 AM By: Elpidio Eric BSN, RN Entered By: Elpidio Eric on 09/02/2015 10:38:05 [name] ([id number]) -------------------------------------------------------------------------------- Pain Assessment Details Patient Name: [name] Date of Service: 09/02/2015 10:00 AM Medical Record Number: [id number] Patient Account Number: [id number] Date of Birth/Sex: 05-11-1921 (81 y.o. Female) Treating RN: Afful, RN, BSN, [place] Sink Primary Care Physician: Dorothey Baseman Other Clinician: Referring Physician: Terance Hart DAVID Treating Physician/Extender: Rudene Re in Treatment: 29 Active Problems Location of Pain Severity and Description of Pain Patient Has Paino Yes Site Locations Pain Location: Pain in Ulcers Pain Management and Medication Current Pain Management: Rest: Yes How does your pain impact your activities of daily livingo Sleep: Yes Bathing: Yes Appetite: Yes Relationship With Others: Yes Bladder Continence: Yes Emotions: Yes Bowel Continence: Yes Work: Yes Toileting: Yes Drive: Yes Dressing: Yes Hobbies: Yes Electronic Signature(s) Signed: 09/02/2015 10:13:18 AM By: Elpidio Eric BSN, RN Entered By: Elpidio Eric on 09/02/2015 10:13:18 [name] ([id number]) -------------------------------------------------------------------------------- Patient/Caregiver Education Details Patient Name: [name] Date of Service: 09/02/2015 10:00 AM Medical Record Number: [id number] Patient Account Number: [id number] Date  of Birth/Gender: Aug 27, 1921 (81 y.o. Female) Treating RN: Afful, RN, BSN, [place] Sink Primary Care Physician: Dorothey Baseman Other Clinician: Referring Physician: Terance Hart DAVID Treating Physician/Extender: Rudene Re in Treatment: 16 Education Assessment Education Provided To: Patient and Caregiver Education Topics Provided Elevated Blood Sugar/ Impact on Healing: Methods: Explain/Verbal Responses: State content correctly Nutrition: Methods: Explain/Verbal Responses: State content correctly Safety: Methods: Explain/Verbal Responses: State content correctly  Wound/Skin Impairment: Methods: Explain/Verbal Responses: State content correctly Electronic Signature(s) Signed: 09/02/2015 10:52:32 AM By: Elpidio Eric BSN, RN Entered By: Elpidio Eric on 09/02/2015 10:52:32 [name] ([id number]) -------------------------------------------------------------------------------- Wound Assessment Details Patient Name: [name] Date of Service: 09/02/2015 10:00 AM Medical Record Number: [id number] Patient Account Number: [id number] Date of Birth/Sex: 1920-11-26 (81 y.o. Female) Treating RN: Afful, RN, BSN, [place] Sink Primary Care Physician: Terance Hart, DAVID Other Clinician: Referring Physician: Terance Hart, DAVID Treating Physician/Extender: Rudene Re in Treatment: 29 Wound Status Wound Number: 1 Primary Arterial Insufficiency Ulcer Etiology: Wound Location: Right Calcaneous Wound Open Wounding Event: Gradually Appeared Status: Date Acquired: 11/04/2014 Comorbid Arrhythmia, Deep Vein Thrombosis, Weeks Of Treatment: 29 History: Peripheral Arterial Disease, Type II Clustered Wound: No Diabetes Wound Measurements Length: (cm) 0.5 Width: (cm) 1.5 Depth: (cm) 0.1 Area: (cm) 0.589 Volume: (cm) 0.059 % Reduction in Area: 89.3% % Reduction in Volume: 89.3% Epithelialization: Small (1-33%) Tunneling: No Undermining: No Wound Description Full Thickness Without  Exposed Classification: Support Structures Diabetic Severity Grade 1 (Wagner): Wound Margin: Distinct, outline attached Exudate Amount: Medium Exudate Type: Serosanguineous Exudate Color: red, brown Foul Odor After Cleansing: No Wound Bed Granulation Amount: Medium (34-66%) Exposed Structure Granulation Quality: Pink, Pale Fascia Exposed: No Necrotic Amount: Medium (34-66%) Fat Layer Exposed: No Necrotic Quality: Adherent Slough Tendon Exposed: No Muscle Exposed: No Joint Exposed: No Bone Exposed: No Limited to Skin Breakdown Periwound Skin Texture Texture Color No Abnormalities Noted: No No Abnormalities Noted: No Blew, Meygan ([id number]) Callus: No Atrophie Blanche: No Crepitus: No Cyanosis: No Excoriation: No Ecchymosis: No Fluctuance: No Erythema: No Friable: No Hemosiderin Staining: No Induration: No Mottled: No Localized Edema: Yes Pallor: No Rash: No Rubor: No Scarring: No Temperature / Pain Moisture Tenderness on Palpation: Yes No Abnormalities Noted: No Dry / Scaly: No Maceration: No Moist: Yes Wound Preparation Ulcer Cleansing: Rinsed/Irrigated with Saline Topical Anesthetic Applied: Other: lidocaine 4%, Electronic Signature(s) Signed: 09/02/2015 10:30:49 AM By: Elpidio Eric BSN, RN Entered By: Elpidio Eric on 09/02/2015 10:30:49 [name] ([id number]) -------------------------------------------------------------------------------- Wound Assessment Details Patient Name: [name] Date of Service: 09/02/2015 10:00 AM Medical Record Number: [id number] Patient Account Number: [id number] Date of Birth/Sex: 1920/11/13 (81 y.o. Female) Treating RN: Afful, RN, BSN, Rita Primary Care Physician: Terance Hart, DAVID Other Clinician: Referring Physician: Terance Hart, DAVID Treating Physician/Extender: Rudene Re in Treatment: 29 Wound Status Wound Number: 10 Primary Diabetic Wound/Ulcer of the Lower Etiology: Extremity Wound Location:  Right Lower Leg - Anterior Wound Open Wounding Event: Gradually Appeared Status: Date Acquired: 08/31/2015 Comorbid Arrhythmia, Deep Vein Thrombosis, Weeks Of Treatment: 0 History: Peripheral Arterial Disease, Type II Clustered Wound: No Diabetes Wound Measurements Length: (cm) 0.5 Width: (cm) 0.5 Depth: (cm) 0.5 Area: (cm) 0.196 Volume: (cm) 0.098 % Reduction in Area: 0% % Reduction in Volume: 0% Epithelialization: None Tunneling: No Undermining: No Wound Description Classification: Grade 1 Wound Margin: Distinct, outline attached Exudate Amount: Medium Exudate Type: Serous Exudate Color: amber Foul Odor After Cleansing: No Wound Bed Granulation Amount: Large (67-100%) Exposed Structure Granulation Quality: Pink, Pale Fascia Exposed: No Necrotic Amount: Small (1-33%) Fat Layer Exposed: No Necrotic Quality: Adherent Slough Tendon Exposed: No Muscle Exposed: No Joint Exposed: No Bone Exposed: No Limited to Skin Breakdown Periwound Skin Texture Texture Color No Abnormalities Noted: No No Abnormalities Noted: No Callus: No Atrophie Blanche: No Crepitus: No Cyanosis: No Excoriation: No Ecchymosis: No Vivar, Marne ([id number]) Fluctuance: No Erythema: No Friable: No Hemosiderin Staining: No Induration: No Mottled: No Localized Edema: Yes Pallor: No Rash: No Rubor: No  Scarring: No Temperature / Pain Moisture Temperature: No Abnormality No Abnormalities Noted: No Tenderness on Palpation: Yes Dry / Scaly: No Maceration: No Moist: Yes Wound Preparation Ulcer Cleansing: Rinsed/Irrigated with Saline Topical Anesthetic Applied: Other: lidocaine 4%, Electronic Signature(s) Signed: 09/02/2015 10:31:37 AM By: Elpidio Eric BSN, RN Entered By: Elpidio Eric on 09/02/2015 10:31:37 [name] ([id number]) -------------------------------------------------------------------------------- Wound Assessment Details Patient Name: [name] Date of  Service: 09/02/2015 10:00 AM Medical Record Number: [id number] Patient Account Number: [id number] Date of Birth/Sex: 10-24-1920 (81 y.o. Female) Treating RN: Afful, RN, BSN, [place] Sink Primary Care Physician: Dorothey Baseman Other Clinician: Referring Physician: Terance Hart, DAVID Treating Physician/Extender: Rudene Re in Treatment: 29 Wound Status Wound Number: 6 Primary Arterial Insufficiency Ulcer Etiology: Wound Location: Right Lower Leg - Posterior Wound Open Wounding Event: Blister Status: Date Acquired: 06/12/2015 Comorbid Arrhythmia, Deep Vein Thrombosis, Weeks Of Treatment: 11 History: Peripheral Arterial Disease, Type II Clustered Wound: No Diabetes Wound Measurements Length: (cm) 2 Width: (cm) 1.5 Depth: (cm) 0.2 Area: (cm) 2.356 Volume: (cm) 0.471 % Reduction in Area: -200.1% % Reduction in Volume: -496.2% Epithelialization: None Tunneling: No Undermining: No Wound Description Full Thickness Without Exposed Classification: Support Structures Diabetic Severity Grade 1 (Wagner): Wound Margin: Flat and Intact Exudate Amount: Medium Exudate Type: Serosanguineous Exudate Color: red, brown Foul Odor After Cleansing: No Wound Bed Granulation Amount: Medium (34-66%) Exposed Structure Granulation Quality: Pink Fascia Exposed: No Necrotic Amount: Medium (34-66%) Fat Layer Exposed: No Necrotic Quality: Adherent Slough Tendon Exposed: No Muscle Exposed: No Joint Exposed: No Bone Exposed: No Limited to Skin Breakdown Periwound Skin Texture Texture Color No Abnormalities Noted: No No Abnormalities Noted: No [place], Zamyah ([id number]) Callus: No Atrophie Blanche: No Crepitus: No Cyanosis: No Excoriation: No Ecchymosis: No Fluctuance: No Erythema: No Friable: No Hemosiderin Staining: No Induration: No Mottled: No Localized Edema: Yes Pallor: No Rash: No Rubor: No Scarring: No Temperature / Pain Moisture Tenderness on Palpation: Yes No  Abnormalities Noted: No Dry / Scaly: No Maceration: No Moist: Yes Wound Preparation Ulcer Cleansing: Rinsed/Irrigated with Saline Topical Anesthetic Applied: Other: lidocaine 4%, Treatment Notes Wound #6 (Right, Posterior Lower Leg) 1. Cleansed with: Clean wound with Normal Saline 4. Dressing Applied: Medihoney Gel 5. Secondary Dressing Applied Gauze and Kerlix/Conform 7. Secured with Secretary/administrator) Signed: 09/02/2015 10:32:23 AM By: Elpidio Eric BSN, RN Entered By: Elpidio Eric on 09/02/2015 10:32:23 [name] ([id number]) -------------------------------------------------------------------------------- Wound Assessment Details Patient Name: [name] Date of Service: 09/02/2015 10:00 AM Medical Record Number: [id number] Patient Account Number: [id number] Date of Birth/Sex: 01-30-1921 (81 y.o. Female) Treating RN: Afful, RN, BSN, [place] Sink Primary Care Physician: Terance Hart, DAVID Other Clinician: Referring Physician: Terance Hart, DAVID Treating Physician/Extender: Rudene Re in Treatment: 29 Wound Status Wound Number: 9 Primary Diabetic Wound/Ulcer of the Lower Etiology: Extremity Wound Location: Right Lower Leg - Anterior Wound Open Wounding Event: Gradually Appeared Status: Date Acquired: 08/31/2015 Comorbid Arrhythmia, Deep Vein Thrombosis, Weeks Of Treatment: 0 History: Peripheral Arterial Disease, Type II Clustered Wound: No Diabetes Wound Measurements Length: (cm) 0.8 Width: (cm) 0.5 Depth: (cm) 0.2 Area: (cm) 0.314 Volume: (cm) 0.063 % Reduction in Area: 0% % Reduction in Volume: 0% Epithelialization: None Tunneling: No Undermining: No Wound Description Classification: Grade 1 Wound Margin: Distinct, outline attached Exudate Amount: Medium Exudate Type: Serosanguineous Exudate Color: red, brown Wound Bed Granulation Amount: Medium (34-66%) Exposed Structure Granulation Quality: Pink, Pale Fascia Exposed: No Necrotic  Amount: Small (1-33%) Fat Layer Exposed: No Necrotic Quality: Adherent Slough Tendon Exposed: No Muscle Exposed: No Joint Exposed:  No Bone Exposed: No Limited to Skin Breakdown Periwound Skin Texture Texture Color No Abnormalities Noted: No No Abnormalities Noted: No Localized Edema: Yes Temperature / Pain Moisture Temperature: No Abnormality No Abnormalities Noted: No Tenderness on Palpation: Yes Landers, Cherilyn ([id number]) Moist: Yes Wound Preparation Ulcer Cleansing: Rinsed/Irrigated with Saline Topical Anesthetic Applied: Other: lidocaine 4%, Treatment Notes Wound #9 (Right, Anterior Lower Leg) 1. Cleansed with: Clean wound with Normal Saline 4. Dressing Applied: Medihoney Gel 5. Secondary Dressing Applied Gauze and [employer]. Secured with Secretary/administrator) Signed: 09/02/2015 10:33:20 AM By: Elpidio Eric BSN, RN Entered By: Elpidio Eric on 09/02/2015 10:33:20 [name] ([id number]) -------------------------------------------------------------------------------- Vitals Details Patient Name: [name] Date of Service: 09/02/2015 10:00 AM Medical Record Number: [id number] Patient Account Number: [id number] Date of Birth/Sex: May 05, 1921 (81 y.o. Female) Treating RN: Afful, RN, BSN, Norman Sink Primary Care Physician: Terance Hart, DAVID Other Clinician: Referring Physician: Terance Hart, DAVID Treating Physician/Extender: Rudene Re in Treatment: 29 Vital Signs Time Taken: 10:17 Temperature (F): 98.1 Height (in): 62 Pulse (bpm): 55 Weight (lbs): 155 Respiratory Rate (breaths/min): 18 Body Mass Index (BMI): 28.3 Blood Pressure (mmHg): 132/42 Reference Range: 80 - 120 mg / dl Electronic Signature(s) Signed: 09/02/2015 10:18:06 AM By: Elpidio Eric BSN, RN Entered By: Elpidio Eric on 09/02/2015 10:18:06

## 2015-09-03 NOTE — Progress Notes (Signed)
[name] [name] [name], [name] [name] [name] ([id number]) Visit Report for 09/02/2015 Chief Complaint Document Details Patient Name: [name] [name], [name] Date of Service: 09/02/2015 10:00 AM Medical Record Number: [id number] Patient Account Number: [id number] Date of Birth/Sex: Jun 21, 1921 (81 y.o. Female) Treating RN: Clover Mealy, RN, BSN, [place] Sink Primary Care Physician: Dorothey Baseman Other Clinician: Referring Physician: Dorothey Baseman Treating Physician/Extender: Rudene Re in Treatment: 29 Information Obtained from: Patient Chief Complaint Patient presents to the wound care center for a consult due non healing wound. 81 year old patient with comes with a history of a ulcerated area to the right third toe and the right heel which she's had for about 2 months. Electronic Signature(s) Signed: 09/02/2015 11:09:23 AM By: Evlyn Kanner MD, FACS Entered By: Evlyn Kanner on 09/02/2015 11:09:22 [name] [name] [name] ([id number]) -------------------------------------------------------------------------------- HPI Details Patient Name: [name] [name] [name] Date of Service: 09/02/2015 10:00 AM Medical Record Number: [id number] Patient Account Number: [id number] Date of Birth/Sex: 11/29/1920 (81 y.o. Female) Treating RN: Afful, RN, BSN, [place] Sink Primary Care Physician: Dorothey Baseman Other Clinician: Referring Physician: Terance Hart, DAVID Treating Physician/Extender: Rudene Re in Treatment: 29 History of Present Illness Location: right medial heel and right third toe Quality: Patient reports experiencing a dull pain to affected area(s). Severity: Patient states wound are getting worse. Duration: Patient has had the wound for > 3 months prior to seeking treatment at the wound center Timing: Pain in wound is Intermittent (comes and goes Context: The wound appeared gradually over time Modifying Factors: Consults to this date include:vascular surgeon and several recent surgeries. Associated Signs and Symptoms: Patient reports  having foul odor and drainage from the left below-knee amputation site. HPI Description: A pleasant 81 year old patient who is known to have diabetes mellitus for several years has recently gone through a series of operations with the vascular surgeon Dr. Gilda Crease. In January and February she had several surgeries on the left lower extremity with an attempt to have limb salvage for gangrenous changes of her forefoot. 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 On 01/07/2015 she also had a right lower extremity distal runoff with a angioplasty of the right anterior tibial artery to maximize her blood flow to the foot. She recently had her staples removed at Dr. Marijean Heath office on 01/31/2015 and at that time a right lower extremity duplex was done which showed patent vessels and a patent stent with distal occluded posterior tibial artery. Though the duplex was noncritical the recommendations from the PA at the vascular surgery office was that of a angiogram to be done but the patient said she would rather try some bone care before. The patient has received doxycycline and Cipro in the recent past and she takes oral medications for her diabetes. Other than that I reviewed her list of all her medications. No recent hemoglobin A1c has been done and no recent x-rays of the right foot have been done. 02/18/2015 -- x-ray of the right foot was done on 02/11/2015 and it shows no evidence of acute osteomyelitis of the third toe or of the calcaneus. She had gone to the vascular surgery office and they had noted that there is dehiscence of the left part of the amputation site of the below-knee wound and they have asked [place] to kindly take over the care of this. There've been doing dressings for the right heel and right third toe. 02/25/2015 -- we have received notes from the vascular group who saw her last on 02/13/2015 and she was  seen  by the PA Ms. Cleda Daub. She had recommended that the patient continue to follow with [place] for wound care including management of the left BKA stump, which has had dehiscence of the lateral part. They will consider repeating a arterial duplex or angiogram if the right lower extremity does not heal within a reasonable period of time. 03/18/2015 -- he saw the vascular surgeon Dr. Gilda Crease and he has set her up for an angioplasty sometime in the middle of July. she is doing well otherwise. [name] [name] [name] ([id number]) 04/10/2015 -- the patient had a procedure done on 04/08/2015 and this was a right angioplasty of the dorsalis pedis, anterior tibial artery, first superficial femoral artery in its midportion. There was successful intervention with recanalization and in-line flow to the right foot. 04/22/2015 -- the patient was looking rather pale today and the daughter confirms that her hemoglobin was down to 6.9. she is being monitored by her PCP. Her Lasix dose was increased and her edema has gone down significantly. 04/29/2015 -- she had vascular studies done and the ABI on the right is 1.3 and the toe pressures were within normal limits. Her hemoglobin is still around 7 and she refuses to take any blood transfusions for religious reasons. Her potassium was normal. 06/10/2015 -- she has a new blister on her right lateral and posterior part of her leg just in the region where the Kerlix bandages were applied and this may be due to an abrasion. 06/24/2015 -- On her right lower extremity she has developed several more blisters which look like small pustules and the drain informed shallow ulcerations. 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 07/01/2015 -- she has an appointment with the PCP this Friday and her vascular surgeon on Monday. Other than that she is on doxycycline and has finished 1 week of treatment. The pustules she had all over have now resolved. 07/08/2015 -- she saw the vascular  surgeon who did studies in the office and found that there is poor circulation in the distal lower right leg and has set her up for an angiogram and possible stenting next Tuesday. 07/22/2015 -- on October 18 she was taken up for a successful revascularization of the anterior tibial vessel on the right side. a PTA and stent placement was done to the right anterior tibial artery. 08/19/2015 -- she has broken out with some linear ulceration in the region of her webspaces of her toes and this is something new. 08/26/2015 -- over the last 3 days there was a streak of redness going from her lower extremity towards her toes but she had no fever or discharge from the wounds. 09/02/2015 -- the redness and cellulitis has gone down but she continues to have a lot of edema of the right lower extremity. Electronic Signature(s) Signed: 09/02/2015 11:09:53 AM By: Evlyn Kanner MD, FACS Entered By: Evlyn Kanner on 09/02/2015 11:09:53 [name] [name] [name] ([id number]) -------------------------------------------------------------------------------- Physical Exam Details Patient Name: [name] [name] [name] Date of Service: 09/02/2015 10:00 AM Medical Record Number: [id number] Patient Account Number: [id number] Date of Birth/Sex: 1920/10/27 (81 y.o. Female) Treating RN: Clover Mealy, RN, BSN, [place] Sink Primary Care Physician: Dorothey Baseman Other Clinician: Referring Physician: Terance Hart DAVID Treating Physician/Extender: Rudene Re in Treatment: 29 Constitutional . Pulse regular. Respirations normal and unlabored. Afebrile. . Eyes Nonicteric. Reactive to light. Ears, Nose, Mouth, and Throat Lips, teeth, and gums WNL.Marland Kitchen Moist mucosa without lesions . Neck supple and nontender. No palpable supraclavicular or cervical adenopathy. Normal sized without goiter. Respiratory WNL. No retractions.. Cardiovascular Pedal Pulses  WNL. No clubbing, cyanosis or edema. Gastrointestinal (GI) Abdomen without masses or  tenderness.. No liver or spleen enlargement or tenderness.. Lymphatic No adneopathy. No adenopathy. No adenopathy. Musculoskeletal Adexa without tenderness or enlargement.. Digits and nails w/o clubbing, cyanosis, infection, petechiae, ischemia, or inflammatory conditions.. Integumentary (Hair, Skin) No suspicious lesions. No crepitus or fluctuance. No peri-wound warmth or erythema. No masses.Marland Kitchen Psychiatric Judgement and insight Intact.. No evidence of depression, anxiety, or agitation.. Notes the ulcerations are shallow and no debridement was done today. There is no cellulitis and there is no drainage from the area between her toes. Electronic Signature(s) Signed: 09/02/2015 11:10:27 AM By: Evlyn Kanner MD, FACS Entered By: Evlyn Kanner on 09/02/2015 11:10:26 [name] [name] [name] ([id number]) -------------------------------------------------------------------------------- Physician Orders Details Patient Name: [name] [name] [name] Date of Service: 09/02/2015 10:00 AM Medical Record Number: [id number] Patient Account Number: [id number] Date of Birth/Sex: April 30, 1921 (81 y.o. Female) Treating RN: Afful, RN, BSN, [place] Sink Primary Care Physician: Dorothey Baseman Other Clinician: Referring Physician: Terance Hart DAVID Treating Physician/Extender: Rudene Re in Treatment: 65 Verbal / Phone Orders: Yes Clinician: Afful, RN, BSN, Rita Read Back and Verified: Yes Diagnosis Coding Wound Cleansing Wound #1 Right Calcaneous o Cleanse wound with mild soap and water o May Shower, gently pat wound dry prior to applying new dressing. Wound #10 Right,Distal,Anterior Lower Leg o Cleanse wound with mild soap and water o May Shower, gently pat wound dry prior to applying new dressing. Wound #6 Right,Posterior Lower Leg o Cleanse wound with mild soap and water o May Shower, gently pat wound dry prior to applying new dressing. Wound #9 Right,Anterior Lower Leg o Cleanse wound with mild soap  and water o May Shower, gently pat wound dry prior to applying new dressing. Primary Wound Dressing Wound #1 Right Calcaneous o Prisma Ag Wound #10 Right,Distal,Anterior Lower Leg o Medihoney gel Wound #6 Right,Posterior Lower Leg o Medihoney gel Wound #9 Right,Anterior Lower Leg o Medihoney gel Secondary Dressing Wound #1 Right Calcaneous o Gauze and Kerlix/Conform Wound #10 Right,Distal,Anterior Lower Leg o Gauze and Kerlix/Conform Knack, Roselee ([id number]) Wound #6 Right,Posterior Lower Leg o Gauze and Kerlix/Conform Wound #9 Right,Anterior Lower Leg o Gauze and Kerlix/Conform Dressing Change Frequency Wound #1 Right Calcaneous o Change dressing every day. Wound #10 Right,Distal,Anterior Lower Leg o Change dressing every day. Wound #6 Right,Posterior Lower Leg o Change dressing every day. Wound #9 Right,Anterior Lower Leg o Change dressing every day. Follow-up Appointments Wound #1 Right Calcaneous o Return Appointment in 2 weeks. Wound #10 Right,Distal,Anterior Lower Leg o Return Appointment in 2 weeks. Wound #6 Right,Posterior Lower Leg o Return Appointment in 2 weeks. Wound #9 Right,Anterior Lower Leg o Return Appointment in 2 weeks. Electronic Signature(s) Signed: 09/02/2015 10:51:08 AM By: Elpidio Eric BSN, RN Signed: 09/02/2015 4:34:28 PM By: Evlyn Kanner MD, FACS Previous Signature: 09/02/2015 10:42:39 AM Version By: Elpidio Eric BSN, RN Entered By: Elpidio Eric on 09/02/2015 10:51:07 [name] [name] [name] ([id number]) -------------------------------------------------------------------------------- Problem List Details Patient Name: [name] [name] [name] Date of Service: 09/02/2015 10:00 AM Medical Record Number: [id number] Patient Account Number: [id number] Date of Birth/Sex: Jan 17, 1921 (81 y.o. 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 with 02/11/2015 Yes fat layer exposed L97.812 Non-pressure chronic ulcer of other part of right lower leg 02/11/2015 Yes with fat layer exposed T81.31XA Disruption of external operation (surgical) wound, not 02/18/2015 Yes elsewhere classified, initial encounter Inactive Problems Resolved Problems Electronic Signature(s) Signed: 09/02/2015 11:09:16 AM By: Evlyn KannerBritto, Angeles Paolucci MD, FACS Entered By: Evlyn KannerBritto, Macaiah Mangal on 09/02/2015 11:09:15 [name] [name] NationsSILETZKY, [name] [name] [name] ([id number]) -------------------------------------------------------------------------------- Progress Note Details Patient Name: [name] [name] NationsSILETZKY, Shakela Date of Service: 09/02/2015 10:00 AM Medical Record Number: [id number] Patient Account Number: [id number] Date of Birth/Sex: 06/10/1921 (81 y.o. Female) Treating RN: Clover MealyAfful, RN, BSN, [place] Primary Care Physician: Dorothey BasemanBRONSTEIN, DAVID Other Clinician: Referring Physician: Dorothey BasemanBRONSTEIN, DAVID Treating Physician/Extender: Rudene ReBritto, Kathyrn [place] Weeks in Treatment: 29 Subjective Chief Complaint Information obtained from Patient Patient presents to the wound care center for a consult due non healing wound. 81 year old patient with comes with a history of a ulcerated area to the right third toe and the right heel which she's had for about 2 months. History of Present Illness (HPI) The following HPI elements were documented for the patient's wound: Location: right medial heel and right third toe Quality: Patient reports experiencing a dull pain to affected area(s). Severity: Patient states  wound are getting worse. Duration: Patient has had the wound for > 3 months prior to seeking treatment at the wound center Timing: Pain in wound is Intermittent (comes and goes Context: The wound appeared gradually over time Modifying Factors: Consults to this date include:vascular surgeon and several recent surgeries. Associated Signs and Symptoms: Patient reports having foul odor and drainage from the left below-knee amputation site. A pleasant 81 year old patient who is known to have diabetes mellitus for several years has recently gone through a series of operations with the vascular surgeon Dr. Gilda CreaseSchnier. In January and February she had several surgeries on the left lower extremity with an attempt to have limb salvage for gangrenous changes of her forefoot. 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 On 01/07/2015 she also had a right lower extremity distal runoff with a angioplasty of the right anterior tibial artery to maximize her blood flow to the foot. She recently had her staples removed at Dr. Marijean HeathSchnier's office on 01/31/2015 and at that time a right lower extremity duplex was done which showed patent vessels and a patent stent with distal occluded posterior tibial artery. Though the duplex was noncritical the recommendations from the PA at the vascular surgery office was that of a angiogram to be done but the patient said she would rather try some bone care before. The patient has received doxycycline and Cipro in the recent past and she takes oral medications for her diabetes. Other than that I reviewed her list of all her medications. No recent hemoglobin A1c has been done and no recent x-rays of the right foot have been done. 02/18/2015 -- x-ray of the right foot was done on 02/11/2015 and it shows no evidence of acute osteomyelitis of the third toe or of the calcaneus. She had gone to the vascular surgery  office and they had noted that there is dehiscence of the left part of the amputation site of the below-knee wound and they have asked us to kindly take over the care of this. There've been doing dressings for the right heel and right third toe. [name] [name] NationsSILETZKY, Tashyra ([id number]) 02/25/2015 -- we have received notes from the vascular group who saw her last on 02/13/2015 and she was  seen by the PA Ms. Cleda Daub. She had recommended that the patient continue to follow with [place] for wound care including management of the left BKA stump, which has had dehiscence of the lateral part. They will consider repeating a arterial duplex or angiogram if the right lower extremity does not heal within a reasonable period of time. 03/18/2015 -- he saw the vascular surgeon Dr. Gilda Crease and he has set her up for an angioplasty sometime in the middle of July. she is doing well otherwise. 04/10/2015 -- the patient had a procedure done on 04/08/2015 and this was a right angioplasty of the dorsalis pedis, anterior tibial artery, first superficial femoral artery in its midportion. There was successful intervention with recanalization and in-line flow to the right foot. 04/22/2015 -- the patient was looking rather pale today and the daughter confirms that her hemoglobin was down to 6.9. she is being monitored by her PCP. Her Lasix dose was increased and her edema has gone down significantly. 04/29/2015 -- she had vascular studies done and the ABI on the right is 1.3 and the toe pressures were within normal limits. Her hemoglobin is still around 7 and she refuses to take any blood transfusions for religious reasons. Her potassium was normal. 06/10/2015 -- she has a new blister on her right lateral and posterior part of her leg just in the region where the Kerlix bandages were applied and this may be due to an abrasion. 06/24/2015 -- On her right lower extremity she has developed several more blisters which look like  small pustules and the drain informed shallow ulcerations. 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 07/01/2015 -- she has an appointment with the PCP this Friday and her vascular surgeon on Monday. Other than that she is on doxycycline and has finished 1 week of treatment. The pustules she had all over have now resolved. 07/08/2015 -- she saw the vascular surgeon who did studies in the office and found that there is poor circulation in the distal lower right leg and has set her up for an angiogram and possible stenting next Tuesday. 07/22/2015 -- on October 18 she was taken up for a successful revascularization of the anterior tibial vessel on the right side. a PTA and stent placement was done to the right anterior tibial artery. 08/19/2015 -- she has broken out with some linear ulceration in the region of her webspaces of her toes and this is something new. 08/26/2015 -- over the last 3 days there was a streak of redness going from her lower extremity towards her toes but she had no fever or discharge from the wounds. 09/02/2015 -- the redness and cellulitis has gone down but she continues to have a lot of edema of the right lower extremity. Objective Constitutional [name] [name] [name], [name] [name] [name] ([id number]) Pulse regular. Respirations normal and unlabored. Afebrile. Vitals Time Taken: 10:17 AM, Height: 62 in, Weight: 155 lbs, BMI: 28.3, Temperature: 98.1 F, Pulse: 55 bpm, Respiratory Rate: 18 breaths/min, Blood Pressure: 132/42 mmHg. Eyes Nonicteric. Reactive to light. Ears, Nose, Mouth, and Throat Lips, teeth, and gums WNL.Marland Kitchen Moist mucosa without lesions . Neck supple and nontender. No palpable supraclavicular or cervical adenopathy. Normal sized without goiter. Respiratory WNL. No retractions.. Cardiovascular Pedal Pulses WNL. No clubbing, cyanosis or edema. Gastrointestinal (GI) Abdomen without masses or tenderness.. No liver or spleen enlargement or tenderness.. Lymphatic No adneopathy. No  adenopathy. No adenopathy. Musculoskeletal Adexa without tenderness or enlargement.. Digits and nails w/o clubbing, cyanosis, infection, petechiae, ischemia, or inflammatory conditions.Marland Kitchen Psychiatric Judgement  and insight Intact.. No evidence of depression, anxiety, or agitation.. General Notes: the ulcerations are shallow and no debridement was done today. There is no cellulitis and there is no drainage from the area between her toes. Integumentary (Hair, Skin) No suspicious lesions. No crepitus or fluctuance. No peri-wound warmth or erythema. No masses.. Wound #1 status is Open. Original cause of wound was Gradually Appeared. The wound is located on the Right Calcaneous. The wound measures 0.5cm length x 1.5cm width x 0.1cm depth; 0.589cm^2 area and 0.059cm^3 volume. The wound is limited to skin breakdown. There is no tunneling or undermining noted. There is a medium amount of serosanguineous drainage noted. The wound margin is distinct with the outline attached to the wound base. There is medium (34-66%) pink, pale granulation within the wound bed. There is a medium (34-66%) amount of necrotic tissue within the wound bed including Adherent Slough. The periwound skin appearance exhibited: Localized Edema, Moist. The periwound skin appearance did not exhibit: Callus, Crepitus, Excoriation, Fluctuance, Friable, Induration, Rash, Scarring, Dry/Scaly, Maceration, Atrophie Blanche, Cyanosis, Ecchymosis, Hemosiderin Staining, Mottled, Pallor, Rubor, Erythema. The periwound has tenderness on palpation. [name] [name] [name], [name] [name] [name] ([id number]) Wound #10 status is Open. Original cause of wound was Gradually Appeared. The wound is located on the Right,Distal,Anterior Lower Leg. The wound measures 0.5cm length x 0.5cm width x 0.5cm depth; 0.196cm^2 area and 0.098cm^3 volume. The wound is limited to skin breakdown. There is no tunneling or undermining noted. There is a medium amount of serous drainage noted. The  wound margin is distinct with the outline attached to the wound base. There is large (67-100%) pink, pale granulation within the wound bed. There is a small (1-33%) amount of necrotic tissue within the wound bed including Adherent Slough. The periwound skin appearance exhibited: Localized Edema, Moist. The periwound skin appearance did not exhibit: Callus, Crepitus, Excoriation, Fluctuance, Friable, Induration, Rash, Scarring, Dry/Scaly, Maceration, Atrophie Blanche, Cyanosis, Ecchymosis, Hemosiderin Staining, Mottled, Pallor, Rubor, Erythema. Periwound temperature was noted as No Abnormality. The periwound has tenderness on palpation. Wound #6 status is Open. Original cause of wound was Blister. The wound is located on the Right,Posterior Lower Leg. The wound measures 2cm length x 1.5cm width x 0.2cm depth; 2.356cm^2 area and 0.471cm^3 volume. The wound is limited to skin breakdown. There is no tunneling or undermining noted. There is a medium amount of serosanguineous drainage noted. The wound margin is flat and intact. There is medium (34-66%) pink granulation within the wound bed. There is a medium (34-66%) amount of necrotic tissue within the wound bed including Adherent Slough. The periwound skin appearance exhibited: Localized Edema, Moist. The periwound skin appearance did not exhibit: Callus, Crepitus, Excoriation, Fluctuance, Friable, Induration, Rash, Scarring, Dry/Scaly, Maceration, Atrophie Blanche, Cyanosis, Ecchymosis, Hemosiderin Staining, Mottled, Pallor, Rubor, Erythema. The periwound has tenderness on palpation. Wound #9 status is Open. Original cause of wound was Gradually Appeared. The wound is located on the Right,Anterior Lower Leg. The wound measures 0.8cm length x 0.5cm width x 0.2cm depth; 0.314cm^2 area and 0.063cm^3 volume. The wound is limited to skin breakdown. There is no tunneling or undermining noted. There is a medium amount of serosanguineous drainage noted.  The wound margin is distinct with the outline attached to the wound base. There is medium (34-66%) pink, pale granulation within the wound bed. There is a small (1-33%) amount of necrotic tissue within the wound bed including Adherent Slough. The periwound skin appearance exhibited: Localized Edema, Moist. Periwound temperature was noted as No Abnormality. The periwound has tenderness on  palpation. 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 Management discussed with her caregiver and she will be back to see me next week Plan Wound Cleansing: Wound #1 Right Calcaneous: Cleanse wound with mild soap and water May Shower, gently pat wound dry prior to applying new dressing. Wound #10 Right,Distal,Anterior Lower Leg: Cleanse wound with mild soap and water May Shower, gently pat wound dry prior to applying new dressing. Wound #6 Right,Posterior Lower Leg: Cleanse wound with mild soap and water May Shower, gently pat wound dry prior to applying new dressing. Wound #9 Right,Anterior Lower Leg: Cleanse wound with mild soap and water May Shower, gently pat wound dry prior to applying new dressing. Primary Wound Dressing: Wound #1 Right Calcaneous: Prisma Ag Wound #10 Right,Distal,Anterior Lower Leg: Medihoney gel Wound #6 Right,Posterior Lower  Leg: Medihoney gel Wound #9 Right,Anterior Lower Leg: Medihoney gel Secondary Dressing: Wound #1 Right Calcaneous: Gauze and Kerlix/Conform Wound #10 Right,Distal,Anterior Lower Leg: Gauze and Kerlix/Conform Wound #6 Right,Posterior Lower Leg: Gauze and Kerlix/Conform Wound #9 Right,Anterior Lower Leg: Gauze and Kerlix/Conform Dressing Change Frequency: Wound #1 Right Calcaneous: Change dressing every day. Wound #10 Right,Distal,Anterior Lower Leg: Change dressing every day. Wound #6 Right,Posterior Lower Leg: Change dressing every day. Wound #9 Right,Anterior Lower Leg: [name] [name] [name], [name] [name] [name] ([id number]) Change dressing every day. Follow-up Appointments: Wound #1 Right Calcaneous: Return Appointment in 2 weeks. Wound #10 Right,Distal,Anterior Lower Leg: Return Appointment in 2 weeks. Wound #6 Right,Posterior Lower Leg: Return Appointment in 2 weeks. Wound #9 Right,Anterior Lower Leg: Return Appointment in 2 weeks. Dressing changes will be as before with Prisma for the medial heel, Silver alginate rope in between the toes and med ihoney for the lower extremity wounds. Management discussed with her caregiver and she will be back to see me next week Electronic Signature(s) Signed: 09/02/2015 11:10:42 AM By: Evlyn Kanner MD, FACS Entered By: Evlyn Kanner on 09/02/2015 11:10:42 [name] [name] [name] ([id number]) -------------------------------------------------------------------------------- SuperBill Details Patient Name: [name] [name] [name] Date of Service: 09/02/2015 Medical Record Number: [id number] Patient Account Number: [id number] Date of Birth/Sex: [birth year] (81 y.o. 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 L97.812 Non-pressure chronic ulcer of other part of right lower leg with fat layer exposed Disruption of external operation (surgical) wound, not elsewhere classified, initial T81.31XA encounter Facility Procedures CPT4 Code: [id number] Description: 25366 - WOUND CARE VISIT-LEV 5 EST PT Modifier: Quantity: 1 Physician Procedures CPT4: Description Modifier Quantity Code [id number] - WC PHYS LEVEL 3 - EST PT 1 ICD-10 Description Diagnosis E11.621 Type 2  diabetes mellitus with foot ulcer I70.235 Atherosclerosis of native arteries of right leg with ulceration of other part of  foot 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

## 2015-09-16 ENCOUNTER — Encounter: Payer: Medicare Other | Admitting: Surgery

## 2015-09-16 DIAGNOSIS — E11621 Type 2 diabetes mellitus with foot ulcer: Secondary | ICD-10-CM | POA: Diagnosis not present

## 2015-09-17 NOTE — Progress Notes (Signed)
ZANETTA, DEHAAN ([id number]) Visit Report for 09/16/2015 Arrival Information Details Patient Name: [name], [name] Date of Service: 09/16/2015 10:00 AM Medical Record Number: [id number] Patient Account Number: [id number] Date of Birth/Sex: 12-04-1920 (81 y.o. Female) Treating RN: Curtis Sites Primary Care Physician: Dorothey Baseman Other Clinician: Referring Physician: Dorothey Baseman Treating Physician/Extender: Rudene Re in Treatment: 31 Visit Information History Since Last Visit Added or deleted any medications: No Patient Arrived: Wheel Chair Any new allergies or adverse reactions: No Arrival Time: 10:07 Had a fall or experienced change in No activities of daily living that may affect Accompanied By: dtr in law risk of falls: Transfer Assistance: Manual Signs or symptoms of abuse/neglect since last No Patient Identification Verified: Yes visito Secondary Verification Process Yes Hospitalized since last visit: No Completed: Pain Present Now: Yes Patient Has Alerts: Yes Patient Alerts: DMII Electronic Signature(s) Signed: 09/16/2015 4:41:48 PM By: Curtis Sites Entered By: Curtis Sites on 09/16/2015 10:09:47 [name] ([id number]) -------------------------------------------------------------------------------- Encounter Discharge Information Details Patient Name: [name] Date of Service: 09/16/2015 10:00 AM Medical Record Number: [id number] Patient Account Number: [id number] Date of Birth/Sex: 06/22/1921 (81 y.o. Female) Treating RN: Curtis Sites Primary Care Physician: Dorothey Baseman Other Clinician: Referring Physician: Dorothey Baseman Treating Physician/Extender: Rudene Re in Treatment: 32 Encounter Discharge Information Items Discharge Pain Level: 0 Discharge Condition: Stable Ambulatory Status: Wheelchair Discharge Destination: Home Transportation: Private Auto Accompanied By: dtr in law Schedule Follow-up Appointment:  Yes Medication Reconciliation completed and provided to Patient/Care No [name]: Provided on Clinical Summary of Care: 09/16/2015 Form Type Recipient Paper Patient HS Electronic Signature(s) Signed: 09/16/2015 11:49:07 AM By: Curtis Sites Previous Signature: 09/16/2015 10:51:39 AM Version By: Gwenlyn Perking Entered By: Curtis Sites on 09/16/2015 11:49:06 [name] ([id number]) -------------------------------------------------------------------------------- Lower Extremity Assessment Details Patient Name: [name] Date of Service: 09/16/2015 10:00 AM Medical Record Number: [id number] Patient Account Number: [id number] Date of Birth/Sex: Apr 20, 1921 (81 y.o. Female) Treating RN: Curtis Sites Primary Care Physician: Dorothey Baseman Other Clinician: Referring Physician: Dorothey Baseman Treating Physician/Extender: Rudene Re in Treatment: 31 Edema Assessment Assessed: [[name]: No] [Right: No] Edema: [[name]: Ye] [Right: s] Vascular Assessment Pulses: Posterior Tibial Dorsalis Pedis Palpable: [Right:No] Doppler: [Right:Monophasic] Extremity colors, hair growth, and conditions: Extremity Color: [Right:Pale] Hair Growth on Extremity: [Right:No] Temperature of Extremity: [Right:Warm] Capillary Refill: [Right:< 3 seconds] Toe Nail Assessment [name]: Right: Thick: Yes Discolored: Yes Deformed: No Improper Length and Hygiene: Yes Electronic Signature(s) Signed: 09/16/2015 4:41:48 PM By: Curtis Sites Entered By: Curtis Sites on 09/16/2015 10:23:31 [name] ([id number]) -------------------------------------------------------------------------------- Multi Wound Chart Details Patient Name: [name] Date of Service: 09/16/2015 10:00 AM Medical Record Number: [id number] Patient Account Number: [id number] Date of Birth/Sex: 11-04-1920 (81 y.o. 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] Comorbid History: [1:Arrhythmia, Deep Vein Thrombosis, Peripheral Arterial Disease, Type II Arterial Disease, Type II Diabetes] [10:Arrhythmia, Deep Vein Thrombosis, Peripheral Diabetes] [6:Arrhythmia, Deep Vein Thrombosis, Peripheral Arterial Disease,  Type II Diabetes] Date Acquired: [1:11/04/2014] [10:08/31/2015] [6:06/12/2015] Weeks of Treatment: [1:31] [10:2] [6:13] Wound Status: [1:Open] [10:Open] [6:Open] Measurements L  x W x D 0.1x0.3x0.1 [10:0.3x0.3x0.1] [6:2.2x1.3x0.2] (cm) Area (cm) : [1:0.024] [10:0.071] [6:2.246] Volume (cm) : [1:0.002] [10:0.007] [6:0.449] % Reduction in Area: [1:99.60%] [10:63.80%] [6:-186.10%] % Reduction in Volume: 99.60% [10:92.90%] [6:-468.40%] Classification: [1:Full Thickness Without Exposed Support Structures] [10:Grade 1] [6:Full Thickness Without Exposed Support Structures] HBO Classification: [1:Grade 1] [10:N/A] [6:Grade 1] Exudate Amount: [1:Medium] [10:Medium] [6:Medium] Exudate Type: [1:Serosanguineous] [10:Serous] [6:Serosanguineous] Exudate Color: [1:red, brown] [10:amber] [6:red, brown] Wound Margin: [1:Distinct, outline attached Distinct, outline attached] [6:Flat and Intact] Granulation Amount: [1:Large (67-100%)] [10:Large (67-100%)] [6:Medium (34-66%)] Granulation Quality: [1:Pink,  Pale] [10:Pink, Pale] [6:Pink] Necrotic Amount: [1:Small (1-33%)] [10:Small (1-33%)] [6:Medium (34-66%)] Exposed Structures: Fascia: No Fascia: No Fascia: No Fat: No Fat: No Fat: No Tendon: No Tendon: No Tendon: No Muscle: No Muscle: No Muscle: No Joint: No Joint: No Joint: No Bone: No Bone: No Bone: No Limited to Skin Limited to Skin Limited to Skin Breakdown Breakdown Breakdown Epithelialization: Small (1-33%) None None Periwound Skin Texture: Edema: Yes Edema: Yes Edema: Yes Excoriation: No Excoriation: No Excoriation: No Induration: No Induration: No Induration: No Callus: No Callus: No Callus: No Crepitus: No Crepitus: No Crepitus: No Fluctuance: No Fluctuance: No Fluctuance: No Friable: No Friable: No Friable: No Rash: No Rash: No Rash: No Scarring: No Scarring: No Scarring: No Periwound Skin Moist: Yes Moist: Yes Moist: Yes Moisture: Maceration: No Maceration: No Maceration: No Dry/Scaly: No Dry/Scaly: No Dry/Scaly: No Periwound Skin Color: Atrophie Blanche: No Atrophie Blanche: No Atrophie Blanche: No Cyanosis: No Cyanosis: No Cyanosis: No Ecchymosis: No Ecchymosis: No Ecchymosis: No Erythema: No Erythema: No Erythema: No Hemosiderin Staining: No Hemosiderin Staining: No Hemosiderin Staining: No Mottled: No Mottled: No Mottled: No Pallor: No Pallor: No Pallor: No Rubor: No Rubor: No Rubor: No Temperature: N/A No Abnormality N/A Tenderness on Yes Yes Yes Palpation: Wound Preparation: Ulcer Cleansing: Ulcer Cleansing: Ulcer Cleansing: Rinsed/Irrigated with Rinsed/Irrigated with Rinsed/Irrigated with Saline Saline Saline Topical Anesthetic Topical Anesthetic Topical Anesthetic Applied: Other: lidocaine Applied: Other: lidocaine Applied: Other: lidocaine 4% 4% 4% Wound Number: 9 N/A N/A Photos: No Photos N/A N/A Wound Location: Right Lower Leg - Anterior N/A N/A Wounding Event: Gradually Appeared N/A N/A Primary  Etiology: Diabetic Wound/Ulcer of N/A N/A the Lower Extremity Comorbid History: Arrhythmia, Deep Vein N/A N/A Thrombosis, Peripheral Arterial Disease, Type II Diabetes Boxwell, [name] ([id number]) Date Acquired: 08/31/2015 N/A N/A Weeks of Treatment: 2 N/A N/A Wound Status: Open N/A N/A Measurements L x W x D 0.6x0.5x0.1 N/A N/A (cm) Area (cm) : 0.236 N/A N/A Volume (cm) : 0.024 N/A N/A % Reduction in Area: 24.80% N/A N/A % Reduction in Volume: 61.90% N/A N/A Classification: Grade 1 N/A N/A HBO Classification: N/A N/A N/A Exudate Amount: Medium N/A N/A Exudate Type: Serosanguineous N/A N/A Exudate Color: red, brown N/A N/A Wound Margin: Distinct, outline attached N/A N/A Granulation Amount: Medium (34-66%) N/A N/A Granulation Quality: Pink, Pale N/A N/A Necrotic Amount: Small (1-33%) N/A N/A Exposed Structures: Fascia: No N/A N/A Fat: No Tendon: No Muscle: No Joint: No Bone: No Limited to Skin Breakdown Epithelialization: None N/A N/A Periwound Skin Texture: Edema: Yes N/A N/A Periwound Skin Moist: Yes N/A N/A Moisture: Periwound Skin Color: No Abnormalities Noted N/A N/A Temperature: No Abnormality N/A N/A Tenderness on Yes N/A N/A Palpation: Wound Preparation: Ulcer Cleansing: N/A N/A Rinsed/Irrigated with Saline Topical Anesthetic Applied: Other: lidocaine 4% Treatment Notes Electronic Signature(s) Signed: 09/16/2015 4:41:48 PM By: Curtis Sites Entered By: Curtis Sites on 09/16/2015 10:27:55 [name] ([id number]) [name], [name] ([id number]) -------------------------------------------------------------------------------- Multi-Disciplinary Care Plan  Details Patient Name: [name] NationsSILETZKY, Erionna Date of Service: 09/16/2015 10:00 AM Medical Record Number: [id number] Patient Account Number: [id number] Date of Birth/Sex: 07/13/1921 (81 y.o. Female) Treating RN: Curtis Sitesorthy, Joanna Primary Care Physician: Dorothey BasemanBRONSTEIN, DAVID Other Clinician: Referring Physician:  Dorothey BasemanBRONSTEIN, DAVID Treating Physician/Extender: Rudene ReBritto, Errol Weeks in Treatment: 7031 Active Inactive Abuse / Safety / Falls / Self Care Management Nursing Diagnoses: Impaired physical mobility Potential for falls Goals: Patient will remain injury free Date Initiated: 02/11/2015 Goal Status: Active Patient/caregiver will verbalize understanding of skin care regimen Date Initiated: 02/11/2015 Goal Status: Active Patient/caregiver will verbalize/demonstrate measures taken to prevent injury and/or falls Date Initiated: 02/11/2015 Goal Status: Active Patient/caregiver will verbalize/demonstrate understanding of what to do in case of emergency Date Initiated: 02/11/2015 Goal Status: Active Interventions: Assess fall risk on admission and as needed Provide education on fall prevention Provide education on safe transfers Treatment Activities: Patient referred to home care : 09/16/2015 Notes: Nutrition Nursing Diagnoses: Imbalanced nutrition Potential for alteratiion in Nutrition/Potential for imbalanced nutrition [name], [name] ([id number]) Goals: Patient/caregiver verbalizes understanding of need to maintain therapeutic glucose control per primary care physician Date Initiated: 02/11/2015 Goal Status: Active Patient/caregiver will maintain therapeutic glucose control Date Initiated: 02/11/2015 Goal Status: Active Interventions: Assess HgA1c results as ordered upon admission and as needed Provide education on elevated blood sugars and impact on wound healing Provide education on nutrition Treatment Activities: Education provided on Nutrition : 04/22/2015 Obtain HgA1c : 09/16/2015 Notes: Wound/Skin Impairment Nursing Diagnoses: Impaired tissue integrity Knowledge deficit related to ulceration/compromised skin integrity Goals: Patient/caregiver will verbalize understanding of skin care regimen Date Initiated: 02/11/2015 Goal Status: Active Ulcer/skin breakdown will heal within 14  weeks Date Initiated: 02/11/2015 Goal Status: Active Interventions: Assess patient/caregiver ability to obtain necessary supplies Assess patient/caregiver ability to perform ulcer/skin care regimen upon admission and as needed Assess ulceration(s) every visit Provide education on ulcer and skin care Treatment Activities: Skin care regimen initiated : 09/16/2015 Topical wound management initiated : 09/16/2015 Notes: [name] NationsSILETZKY, Sharifa ([id number]) Electronic Signature(s) Signed: 09/16/2015 4:41:48 PM By: Curtis Sitesorthy, Joanna Entered By: Curtis Sitesorthy, Joanna on 09/16/2015 10:27:45 [name] NationsSILETZKY, Shaneka ([id number]) -------------------------------------------------------------------------------- Pain Assessment Details Patient Name: [name] NationsSILETZKY, Cherice Date of Service: 09/16/2015 10:00 AM Medical Record Number: [id number] Patient Account Number: [id number] Date of Birth/Sex: 08/08/1921 (81 y.o. Female) Treating RN: Curtis Sitesorthy, Joanna Primary Care Physician: Dorothey BasemanBRONSTEIN, DAVID Other Clinician: Referring Physician: Dorothey BasemanBRONSTEIN, DAVID Treating Physician/Extender: Rudene ReBritto, Errol Weeks in Treatment: 31 Active Problems Location of Pain Severity and Description of Pain Patient Has Paino Yes Site Locations Pain Location: Pain in Ulcers With Dressing Change: Yes Duration of the Pain. Constant / Intermittento Constant Pain Management and Medication Current Pain Management: Electronic Signature(s) Signed: 09/16/2015 4:41:48 PM By: Curtis Sitesorthy, Joanna Entered By: Curtis Sitesorthy, Joanna on 09/16/2015 10:10:02 [name] NationsSILETZKY, [name] ([id number]) -------------------------------------------------------------------------------- Patient/Caregiver Education Details Patient Name: [name] NationsSILETZKY, [name] Date of Service: 09/16/2015 10:00 AM Medical Record Number: [id number] Patient Account Number: [id number] Date of Birth/Gender: 11/18/1920 (81 y.o. Female) Treating RN: Curtis Sitesorthy, Joanna Primary Care Physician: Dorothey BasemanBRONSTEIN, DAVID Other Clinician: Referring  Physician: Dorothey BasemanBRONSTEIN, DAVID Treating Physician/Extender: Rudene ReBritto, Errol Weeks in Treatment: 1831 Education Assessment Education Provided To: Patient and Caregiver Education Topics Provided Wound/Skin Impairment: Handouts: Other: wound care as ordered Methods: Demonstration, Explain/Verbal Responses: State content correctly Electronic Signature(s) Signed: 09/16/2015 11:49:26 AM By: Curtis Sitesorthy, Joanna Entered By: Curtis Sitesorthy, Joanna on 09/16/2015 11:49:26 [name] NationsSILETZKY, [name] ([id number]) -------------------------------------------------------------------------------- Wound Assessment Details Patient Name: [name] NationsSILETZKY, [name] Date of Service: 09/16/2015 10:00 AM Medical Record Number: [id number] Patient Account Number: [id number] Date of Birth/Sex: 05/09/1921 (81 y.o. Female) Treating RN: Curtis Sitesorthy, Joanna  Primary Care Physician: Terance Hart, DAVID Other Clinician: Referring Physician: Terance Hart, DAVID Treating Physician/Extender: Rudene Re in Treatment: 31 Wound Status Wound Number: 1 Primary Arterial Insufficiency Ulcer Etiology: Wound Location: Right Calcaneous Wound Open Wounding Event: Gradually Appeared Status: Date Acquired: 11/04/2014 Comorbid Arrhythmia, Deep Vein Thrombosis, Weeks Of Treatment: 31 History: Peripheral Arterial Disease, Type II Clustered Wound: No Diabetes Photos Photo Uploaded By: Curtis Sites on 09/16/2015 12:00:18 Wound Measurements Length: (cm) 0.1 Width: (cm) 0.3 Depth: (cm) 0.1 Area: (cm) 0.024 Volume: (cm) 0.002 % Reduction in Area: 99.6% % Reduction in Volume: 99.6% Epithelialization: Small (1-33%) Tunneling: No Undermining: No Wound Description Full Thickness Without Exposed Classification: Support Structures Diabetic Severity Grade 1 (Wagner): Wound Margin: Distinct, outline attached Exudate Amount: Medium Exudate Type: Serosanguineous Exudate Color: red, brown Foul Odor After Cleansing: No Wound Bed Granulation Amount: Large  (67-100%) Exposed Structure Niven, [name] ([id number]) Granulation Quality: Pink, Pale Fascia Exposed: No Necrotic Amount: Small (1-33%) Fat Layer Exposed: No Necrotic Quality: Adherent Slough Tendon Exposed: No Muscle Exposed: No Joint Exposed: No Bone Exposed: No Limited to Skin Breakdown Periwound Skin Texture Texture Color No Abnormalities Noted: No No Abnormalities Noted: No Callus: No Atrophie Blanche: No Crepitus: No Cyanosis: No Excoriation: No Ecchymosis: No Fluctuance: No Erythema: No Friable: No Hemosiderin Staining: No Induration: No Mottled: No Localized Edema: Yes Pallor: No Rash: No Rubor: No Scarring: No Temperature / Pain Moisture Tenderness on Palpation: Yes No Abnormalities Noted: No Dry / Scaly: No Maceration: No Moist: Yes Wound Preparation Ulcer Cleansing: Rinsed/Irrigated with Saline Topical Anesthetic Applied: Other: lidocaine 4%, Treatment Notes Wound #1 (Right Calcaneous) 1. Cleansed with: Clean wound with Normal Saline 2. Anesthetic Topical Lidocaine 4% cream to wound bed prior to debridement 3. Peri-wound Care: Skin Prep 5. Secondary Dressing Applied Bordered Foam Dressing Notes aquacel ag between great toe and second toe Electronic Signature(s) Signed: 09/16/2015 4:41:48 PM By: Curtis Sites Entered By: Curtis Sites on 09/16/2015 10:24:01 [name] ([id number]) [name], [name] ([id number]) -------------------------------------------------------------------------------- Wound Assessment Details Patient Name: [name] Date of Service: 09/16/2015 10:00 AM Medical Record Number: [id number] Patient Account Number: [id number] Date of Birth/Sex: [birth year] (81 y.o. Female) Treating RN: Curtis Sites Primary Care Physician: Dorothey Baseman Other Clinician: Referring Physician: Terance Hart, DAVID Treating Physician/Extender: Rudene Re in Treatment: 31 Wound Status Wound Number: 10 Primary Diabetic  Wound/Ulcer of the Lower Etiology: Extremity Wound Location: Right Lower Leg - Anterior, Distal Wound Open Status: Wounding Event: Gradually Appeared Comorbid Arrhythmia, Deep Vein Thrombosis, Date Acquired: 08/31/2015 History: Peripheral Arterial Disease, Type II Weeks Of Treatment: 2 Diabetes Clustered Wound: No Photos Photo Uploaded By: Curtis Sites on 09/16/2015 11:58:31 Wound Measurements Length: (cm) 0.3 Width: (cm) 0.3 Depth: (cm) 0.1 Area: (cm) 0.071 Volume: (cm) 0.007 % Reduction in Area: 63.8% % Reduction in Volume: 92.9% Epithelialization: None Tunneling: No Undermining: No Wound Description Classification: Grade 1 Wound Margin: Distinct, outline attached Exudate Amount: Medium Exudate Type: Serous Exudate Color: amber Foul Odor After Cleansing: No Wound Bed Granulation Amount: Large (67-100%) Exposed Structure Granulation Quality: Pink, Pale Fascia Exposed: No Necrotic Amount: Small (1-33%) Fat Layer Exposed: No [name], [name] ([id number]) Necrotic Quality: Adherent Slough Tendon Exposed: No Muscle Exposed: No Joint Exposed: No Bone Exposed: No Limited to Skin Breakdown Periwound Skin Texture Texture Color No Abnormalities Noted: No No Abnormalities Noted: No Callus: No Atrophie Blanche: No Crepitus: No Cyanosis: No Excoriation: No Ecchymosis: No Fluctuance: No Erythema: No Friable: No Hemosiderin Staining: No Induration: No Mottled: No Localized Edema: Yes Pallor: No Rash: No Rubor:  No Scarring: No Temperature / Pain Moisture Temperature: No Abnormality No Abnormalities Noted: No Tenderness on Palpation: Yes Dry / Scaly: No Maceration: No Moist: Yes Wound Preparation Ulcer Cleansing: Rinsed/Irrigated with Saline Topical Anesthetic Applied: Other: lidocaine 4%, Treatment Notes Wound #10 (Right, Distal, Anterior Lower Leg) 1. Cleansed with: Clean wound with Normal Saline 2. Anesthetic Topical Lidocaine 4% cream to wound  bed prior to debridement 4. Dressing Applied: Medihoney Alginate 5. Secondary Dressing Applied Gauze and Kerlix/Conform 7. Secured with Secretary/administrator) Signed: 09/16/2015 4:41:48 PM By: Curtis Sites Entered By: Curtis Sites on 09/16/2015 10:24:20 [name] ([id number]) -------------------------------------------------------------------------------- Wound Assessment Details Patient Name: [name] Date of Service: 09/16/2015 10:00 AM Medical Record Number: [id number] Patient Account Number: [id number] Date of Birth/Sex: 03/26/1921 (81 y.o. Female) Treating RN: Curtis Sites Primary Care Physician: Dorothey Baseman Other Clinician: Referring Physician: Terance Hart, DAVID Treating Physician/Extender: Rudene Re in Treatment: 31 Wound Status Wound Number: 6 Primary Arterial Insufficiency Ulcer Etiology: Wound Location: Right Lower Leg - Posterior Wound Open Wounding Event: Blister Status: Date Acquired: 06/12/2015 Comorbid Arrhythmia, Deep Vein Thrombosis, Weeks Of Treatment: 13 History: Peripheral Arterial Disease, Type II Clustered Wound: No Diabetes Photos Photo Uploaded By: Curtis Sites on 09/16/2015 11:58:52 Wound Measurements Length: (cm) 2.2 Width: (cm) 1.3 Depth: (cm) 0.2 Area: (cm) 2.246 Volume: (cm) 0.449 % Reduction in Area: -186.1% % Reduction in Volume: -468.4% Epithelialization: None Tunneling: No Undermining: No Wound Description Full Thickness Without Exposed Classification: Support Structures Diabetic Severity Grade 1 (Wagner): Wound Margin: Flat and Intact Exudate Amount: Medium Exudate Type: Serosanguineous Exudate Color: red, brown Foul Odor After Cleansing: No Wound Bed Granulation Amount: Medium (34-66%) Exposed Structure Szafranski, [name] ([id number]) Granulation Quality: Pink Fascia Exposed: No Necrotic Amount: Medium (34-66%) Fat Layer Exposed: No Necrotic Quality: Adherent Slough Tendon  Exposed: No Muscle Exposed: No Joint Exposed: No Bone Exposed: No Limited to Skin Breakdown Periwound Skin Texture Texture Color No Abnormalities Noted: No No Abnormalities Noted: No Callus: No Atrophie Blanche: No Crepitus: No Cyanosis: No Excoriation: No Ecchymosis: No Fluctuance: No Erythema: No Friable: No Hemosiderin Staining: No Induration: No Mottled: No Localized Edema: Yes Pallor: No Rash: No Rubor: No Scarring: No Temperature / Pain Moisture Tenderness on Palpation: Yes No Abnormalities Noted: No Dry / Scaly: No Maceration: No Moist: Yes Wound Preparation Ulcer Cleansing: Rinsed/Irrigated with Saline Topical Anesthetic Applied: Other: lidocaine 4%, Treatment Notes Wound #6 (Right, Posterior Lower Leg) 1. Cleansed with: Clean wound with Normal Saline 2. Anesthetic Topical Lidocaine 4% cream to wound bed prior to debridement 4. Dressing Applied: Medihoney Alginate 5. Secondary Dressing Applied Gauze and Kerlix/Conform 7. Secured with Secretary/administrator) Signed: 09/16/2015 4:41:48 PM By: Curtis Sites Entered By: Curtis Sites on 09/16/2015 10:24:38 [name] ([id number] [name], [name] ([id number]) -------------------------------------------------------------------------------- Wound Assessment Details Patient Name: [name] Date of Service: 09/16/2015 10:00 AM Medical Record Number: [id number] Patient Account Number: [id number] Date of Birth/Sex: 09-11-1921 (81 y.o. Female) Treating RN: Curtis Sites Primary Care Physician: Dorothey Baseman Other Clinician: Referring Physician: Terance Hart, DAVID Treating Physician/Extender: Rudene Re in Treatment: 31 Wound Status Wound Number: 9 Primary Diabetic Wound/Ulcer of the Lower Etiology: Extremity Wound Location: Right Lower Leg - Anterior Wound Open Wounding Event: Gradually Appeared Status: Date Acquired: 08/31/2015 Comorbid Arrhythmia, Deep Vein  Thrombosis, Weeks Of Treatment: 2 History: Peripheral Arterial Disease, Type II Clustered Wound: No Diabetes Photos Photo Uploaded By: Curtis Sites on 09/16/2015 11:58:32 Wound Measurements Length: (cm) 0.6 Width: (cm) 0.5 Depth: (cm) 0.1 Area: (cm) 0.236 Volume: (cm) 0.024 %  Reduction in Area: 24.8% % Reduction in Volume: 61.9% Epithelialization: None Tunneling: No Undermining: No Wound Description Classification: Grade 1 Wound Margin: Distinct, outline attached Exudate Amount: Medium Exudate Type: Serosanguineous Exudate Color: red, brown Wound Bed Granulation Amount: Medium (34-66%) Exposed Structure Granulation Quality: Pink, Pale Fascia Exposed: No Necrotic Amount: Small (1-33%) Fat Layer Exposed: No Necrotic Quality: Adherent Slough Tendon Exposed: No Eicher, [name] ([id number]) Muscle Exposed: No Joint Exposed: No Bone Exposed: No Limited to Skin Breakdown Periwound Skin Texture Texture Color No Abnormalities Noted: No No Abnormalities Noted: No Localized Edema: Yes Temperature / Pain Moisture Temperature: No Abnormality No Abnormalities Noted: No Tenderness on Palpation: Yes Moist: Yes Wound Preparation Ulcer Cleansing: Rinsed/Irrigated with Saline Topical Anesthetic Applied: Other: lidocaine 4%, Treatment Notes Wound #9 (Right, Anterior Lower Leg) 1. Cleansed with: Clean wound with Normal Saline 2. Anesthetic Topical Lidocaine 4% cream to wound bed prior to debridement 4. Dressing Applied: Medihoney Alginate 5. Secondary Dressing Applied Gauze and Kerlix/Conform 7. Secured with Secretary/administrator) Signed: 09/16/2015 4:41:48 PM By: Curtis Sites Entered By: Curtis Sites on 09/16/2015 10:24:55 [name] ([id number]) -------------------------------------------------------------------------------- Vitals Details Patient Name: [name] Date of Service: 09/16/2015 10:00 AM Medical Record Number: [id number] Patient  Account Number: [id number] Date of Birth/Sex: Jul 08, 1921 (81 y.o. Female) Treating RN: Curtis Sites Primary Care Physician: Dorothey Baseman Other Clinician: Referring Physician: Terance Hart, DAVID Treating Physician/Extender: Rudene Re in Treatment: 31 Vital Signs Time Taken: 10:10 Temperature (F): 98.2 Height (in): 62 Pulse (bpm): 45 Weight (lbs): 155 Respiratory Rate (breaths/min): 18 Body Mass Index (BMI): 28.3 Blood Pressure (mmHg): 126/40 Reference Range: 80 - 120 mg / dl Electronic Signature(s) Signed: 09/16/2015 4:41:48 PM By: Curtis Sites Entered By: Curtis Sites on 09/16/2015 10:14:38

## 2015-09-17 NOTE — Progress Notes (Signed)
[name], [name] ([id number]) Visit Report for 09/16/2015 Chief Complaint Document Details Patient Name: [name], [name] 09/16/2015 10:00 Date of Service: AM Medical Record [id number] Number: Patient Account Number: [id number] [birth year] (81 y.o. Treating RN: Curtis Sites Date of Birth/Sex: Female) Other Clinician: Primary Care Physician: Terance Hart, DAVID Treating Heather Mckendree Referring Physician: Dorothey Baseman Physician/Extender: Weeks in Treatment: 31 Information Obtained from: Patient Chief Complaint Patient presents to the wound care center for a consult due non healing wound. 81 year old patient with comes with a history of a ulcerated area to the right third toe and the right heel which she's had for about 2 months. Electronic Signature(s) Signed: 09/16/2015 10:42:54 AM By: Evlyn Kanner MD, FACS Entered By: Evlyn Kanner on 09/16/2015 10:42:54 [name] ([id number]) -------------------------------------------------------------------------------- Debridement Details Patient Name: [name], [name] 09/16/2015 10:00 Date of Service: AM Medical Record [id number] Number: Patient Account Number: [id number] [birth year] (81 y.o. Treating RN: Curtis Sites Date of Birth/Sex: Female) Other Clinician: Primary Care Physician: Terance Hart, DAVID Treating Kairy Folsom Referring Physician: Dorothey Baseman Physician/Extender: Tania Ade in Treatment: 31 Debridement Performed for Wound #1 Right Calcaneous Assessment: Performed By: Physician Evlyn Kanner, MD Debridement: Open Wound/Selective Debridement Selective Description: Pre-procedure Yes Verification/Time Out Taken: Start Time: 10:33 Pain Control: Lidocaine 4% Topical Solution Level: Skin/Dermis Total Area Debrided (L x 0.1 (cm) x 0.3 (cm) = 0.03 (cm) W): Tissue and other Non-Viable, Callus, Eschar, Fibrin/Slough material debrided: Instrument: Curette Bleeding: None End Time: 10:34 Procedural Pain: 0 Post  Procedural Pain: 0 Response to Treatment: Procedure was tolerated well Post Debridement Measurements of Total Wound Length: (cm) 0.1 Width: (cm) 0.3 Depth: (cm) 0.1 Volume: (cm) 0.002 Post Procedure Diagnosis Same as Pre-procedure Electronic Signature(s) Signed: 09/16/2015 10:42:31 AM By: Evlyn Kanner MD, FACS Signed: 09/16/2015 4:41:48 PM By: Curtis Sites Entered By: Evlyn Kanner on 09/16/2015 10:42:30 [name] ([id number]) [name], [name] ([id number]) -------------------------------------------------------------------------------- Debridement Details Patient Name: [name], [name] 09/16/2015 10:00 Date of Service: AM Medical Record [id number] Number: Patient Account Number: [id number] Feb 11, 1921 (81 y.o. Treating RN: Curtis Sites Date of Birth/Sex: Female) Other Clinician: Primary Care Physician: Terance Hart, DAVID Treating Dyanne Yorks Referring Physician: Dorothey Baseman Physician/Extender: Weeks in Treatment: 31 Debridement Performed for Wound #6 Right,Posterior Lower Leg Assessment: Performed By: Physician Evlyn Kanner, MD Debridement: Debridement Pre-procedure Yes Verification/Time Out Taken: Start Time: 10:34 Pain Control: Lidocaine 4% Topical Solution Level: Skin/Subcutaneous Tissue Total Area Debrided (L x 2.2 (cm) x 1.3 (cm) = 2.86 (cm) W): Tissue and other Viable, Non-Viable, Fibrin/Slough, Subcutaneous material debrided: Instrument: Curette Bleeding: Minimum Hemostasis Achieved: Pressure End Time: 10:35 Procedural Pain: 0 Post Procedural Pain: 0 Response to Treatment: Procedure was tolerated well Post Debridement Measurements of Total Wound Length: (cm) 2.2 Width: (cm) 1.3 Depth: (cm) 0.2 Volume: (cm) 0.449 Post Procedure Diagnosis Same as Pre-procedure Electronic Signature(s) Signed: 09/16/2015 10:42:42 AM By: Evlyn Kanner MD, FACS Signed: 09/16/2015 4:41:48 PM By: Curtis Sites Entered By: Evlyn Kanner on 09/16/2015  10:42:42 [name] ([id number]) -------------------------------------------------------------------------------- HPI Details Patient Name: [name], [name] 09/16/2015 10:00 Date of Service: AM Medical Record [id number] Number: Patient Account Number: [id number] June 21, 1921 (81 y.o. Treating RN: Curtis Sites Date of Birth/Sex: Female) Other Clinician: Primary Care Physician: Terance Hart, DAVID Treating Evlyn Kanner Referring Physician: Dorothey Baseman Physician/Extender: Weeks in Treatment: 31 History of Present Illness Location: right medial heel and right third toe Quality: Patient reports experiencing a dull pain to affected area(s). Severity: Patient states wound are getting worse. Duration: Patient has had the wound for > 3 months prior to seeking treatment at the wound center Timing: Pain in  wound is Intermittent (comes and goes Context: The wound appeared gradually over time Modifying Factors: Consults to this date include:vascular surgeon and several recent surgeries. Associated Signs and Symptoms: Patient reports having foul odor and drainage from the left below-knee amputation site. HPI Description: A pleasant 81 year old patient who is known to have diabetes mellitus for several years has recently gone through a series of operations with the vascular surgeon Dr. Gilda Crease. In January and February she had several surgeries on the left lower extremity with an attempt to have limb salvage for gangrenous changes of her forefoot. 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 On 01/07/2015 she also had a right lower extremity distal runoff with a angioplasty of the right anterior tibial artery to maximize her blood flow to the foot. She recently had her staples removed at Dr. Marijean Heath office on 01/31/2015 and at that time a right lower extremity duplex was done which showed patent vessels and a patent  stent with distal occluded posterior tibial artery. Though the duplex was noncritical the recommendations from the PA at the vascular surgery office was that of a angiogram to be done but the patient said she would rather try some bone care before. The patient has received doxycycline and Cipro in the recent past and she takes oral medications for her diabetes. Other than that I reviewed her list of all her medications. No recent hemoglobin A1c has been done and no recent x-rays of the right foot have been done. 02/18/2015 -- x-ray of the right foot was done on 02/11/2015 and it shows no evidence of acute osteomyelitis of the third toe or of the calcaneus. She had gone to the vascular surgery office and they had noted that there is dehiscence of the left part of the amputation site of the below-knee wound and they have asked [place] to kindly take over the care of this. There've been doing dressings for the right heel and right third toe. 02/25/2015 -- we have received notes from the vascular group who saw her last on 02/13/2015 and she was seen by the PA Ms. Cleda Daub. She had recommended that the patient continue to follow with [place] for wound care including management of the left BKA stump, which has had dehiscence of the lateral part. They will consider repeating a arterial duplex or angiogram if the right lower extremity does not heal within a reasonable period of time. 03/18/2015 -- he saw the vascular surgeon Dr. Gilda Crease and he has set her up for an angioplasty sometime NAWAZ, Anevay ([id number]) in the middle of July. she is doing well otherwise. 04/10/2015 -- the patient had a procedure done on 04/08/2015 and this was a right angioplasty of the dorsalis pedis, anterior tibial artery, first superficial femoral artery in its midportion. There was successful intervention with recanalization and in-line flow to the right foot. 04/22/2015 -- the patient was looking rather pale today and  the daughter confirms that her hemoglobin was down to 6.9. she is being monitored by her PCP. Her Lasix dose was increased and her edema has gone down significantly. 04/29/2015 -- she had vascular studies done and the ABI on the right is 1.3 and the toe pressures were within normal limits. Her hemoglobin is still around 7 and she refuses to take any blood transfusions for religious reasons. Her potassium was normal. 06/10/2015 -- she has a new blister on her right lateral and posterior part of her  leg just in the region where the Kerlix bandages were applied and this may be due to an abrasion. 06/24/2015 -- On her right lower extremity she has developed several more blisters which look like small pustules and the drain informed shallow ulcerations. 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 07/01/2015 -- she has an appointment with the PCP this Friday and her vascular surgeon on Monday. Other than that she is on doxycycline and has finished 1 week of treatment. The pustules she had all over have now resolved. 07/08/2015 -- she saw the vascular surgeon who did studies in the office and found that there is poor circulation in the distal lower right leg and has set her up for an angiogram and possible stenting next Tuesday. 07/22/2015 -- on October 18 she was taken up for a successful revascularization of the anterior tibial vessel on the right side. a PTA and stent placement was done to the right anterior tibial artery. 08/19/2015 -- she has broken out with some linear ulceration in the region of her webspaces of her toes and this is something new. 08/26/2015 -- over the last 3 days there was a streak of redness going from her lower extremity towards her toes but she had no fever or discharge from the wounds. 09/02/2015 -- the redness and cellulitis has gone down but she continues to have a lot of edema of the right lower extremity. Electronic Signature(s) Signed: 09/16/2015 10:43:00 AM By: Evlyn Kanner MD, FACS Entered By: Evlyn Kanner on 09/16/2015 10:43:00 [name] ([id number]) -------------------------------------------------------------------------------- Physical Exam Details Patient Name: [name], [name] 09/16/2015 10:00 Date of Service: AM Medical Record [id number] Number: Patient Account Number: [id number] Jun 04, 1921 (81 y.o. Treating RN: Curtis Sites Date of Birth/Sex: Female) Other Clinician: Primary Care Physician: Terance Hart, DAVID Treating Evlyn Kanner Referring Physician: Terance Hart, DAVID Physician/Extender: Weeks in Treatment: 31 Constitutional . Pulse regular. Respirations normal and unlabored. Afebrile. . Eyes Nonicteric. Reactive to light. Ears, Nose, Mouth, and Throat Lips, teeth, and gums WNL.Marland Kitchen Moist mucosa without lesions. Neck supple and nontender. No palpable supraclavicular or cervical adenopathy. Normal sized without goiter. Respiratory WNL. No retractions.. Cardiovascular Pedal Pulses WNL. No clubbing, cyanosis or edema. Lymphatic No adneopathy. No adenopathy. No adenopathy. Musculoskeletal Adexa without tenderness or enlargement.. Digits and nails w/o clubbing, cyanosis, infection, petechiae, ischemia, or inflammatory conditions.. Integumentary (Hair, Skin) No suspicious lesions. No crepitus or fluctuance. No peri-wound warmth or erythema. No masses.Marland Kitchen Psychiatric Judgement and insight Intact.. No evidence of depression, anxiety, or agitation.. Notes the medial right calcaneal area has just dry eschar but otherwise looking very good. Some of her other wounds need subcutaneous debridement where there is some slough and this is done with a curette. Electronic Signature(s) Signed: 09/16/2015 10:44:06 AM By: Evlyn Kanner MD, FACS Entered By: Evlyn Kanner on 09/16/2015 10:44:05 [name] ([id number]) -------------------------------------------------------------------------------- Physician Orders Details Patient Name:  [name], [name] 09/16/2015 10:00 Date of Service: AM Medical Record [id number] Number: Patient Account Number: [id number] 11/02/1920 (81 y.o. Treating RN: Curtis Sites Date of Birth/Sex: Female) Other Clinician: Primary Care Physician: Terance Hart, DAVID Treating Hesper Venturella Referring Physician: Dorothey Baseman Physician/Extender: Weeks in Treatment: 4 Verbal / Phone Orders: Yes Clinician: Curtis Sites Read Back and Verified: Yes Diagnosis Coding Wound Cleansing Wound #1 Right Calcaneous o Cleanse wound with mild soap and water o May Shower, gently pat wound dry prior to applying new dressing. Wound #10 Right,Distal,Anterior Lower Leg o Cleanse wound with mild soap and water o May Shower, gently pat wound dry prior to applying new dressing.  Wound #6 Right,Posterior Lower Leg o Cleanse wound with mild soap and water o May Shower, gently pat wound dry prior to applying new dressing. Wound #9 Right,Anterior Lower Leg o Cleanse wound with mild soap and water o May Shower, gently pat wound dry prior to applying new dressing. Primary Wound Dressing Wound #10 Right,Distal,Anterior Lower Leg o Medihoney gel Wound #6 Right,Posterior Lower Leg o Medihoney gel Wound #9 Right,Anterior Lower Leg o Medihoney gel Secondary Dressing Wound #1 Right Calcaneous o Boardered Foam Dressing Wound #10 Right,Distal,Anterior Lower Leg o Gauze and Kerlix/Conform Wound #6 Right,Posterior Lower Leg Erxleben, Gabrielle ([id number]) o Gauze and Kerlix/Conform Wound #9 Right,Anterior Lower Leg o Gauze and Kerlix/Conform Dressing Change Frequency Wound #1 Right Calcaneous o Other: - twice weekly Wound #10 Right,Distal,Anterior Lower Leg o Change dressing every day. Wound #6 Right,Posterior Lower Leg o Change dressing every day. Wound #9 Right,Anterior Lower Leg o Change dressing every day. Follow-up Appointments Wound #1 Right Calcaneous o Return  Appointment in 2 weeks. Wound #10 Right,Distal,Anterior Lower Leg o Return Appointment in 2 weeks. Wound #6 Right,Posterior Lower Leg o Return Appointment in 2 weeks. Wound #9 Right,Anterior Lower Leg o Return Appointment in 2 weeks. Home Health Wound #1 Right Calcaneous o Continue Home Health Visits - HHRN may use aquacel ag between toes if needed for drainage o Home Health Nurse may visit PRN to address patientos wound care needs. o FACE TO FACE ENCOUNTER: MEDICARE and MEDICAID PATIENTS: I certify that this patient is under my care and that I had a face-to-face encounter that meets the physician face-to-face encounter requirements with this patient on this date. The encounter with the patient was in whole or in part for the following MEDICAL CONDITION: (primary reason for Home Healthcare) MEDICAL NECESSITY: I certify, that based on my findings, NURSING services are a medically necessary home health service. HOME BOUND STATUS: I certify that my clinical findings support that this patient is homebound (i.e., Due to illness or injury, pt requires aid of supportive devices such as crutches, cane, wheelchairs, walkers, the use of special transportation or the assistance of another person to leave their place of residence. There is a normal inability to leave the home and doing so requires considerable and taxing effort. Other absences are for medical reasons / religious services and are infrequent or of short duration when for other reasons). JABLONSKI, Mikaylah ([id number]) o If current dressing causes regression in wound condition, may D/C ordered dressing product/s and apply Normal Saline Moist Dressing daily until next Wound Healing Center / Other MD appointment. Notify Wound Healing Center of regression in wound condition at [phone]. o Please direct any NON-WOUND related issues/requests for orders to patient's Primary Care Physician Wound #10 Right,Distal,Anterior Lower  Leg o Continue Home Health Visits - HHRN may use aquacel ag between toes if needed for drainage o Home Health Nurse may visit PRN to address patientos wound care needs. o FACE TO FACE ENCOUNTER: MEDICARE and MEDICAID PATIENTS: I certify that this patient is under my care and that I had a face-to-face encounter that meets the physician face-to-face encounter requirements with this patient on this date. The encounter with the patient was in whole or in part for the following MEDICAL CONDITION: (primary reason for Home Healthcare) MEDICAL NECESSITY: I certify, that based on my findings, NURSING services are a medically necessary home health service. HOME BOUND STATUS: I certify that my clinical findings support that this patient is homebound (i.e., Due to illness or injury, pt requires aid  of supportive devices such as crutches, cane, wheelchairs, walkers, the use of special transportation or the assistance of another person to leave their place of residence. There is a normal inability to leave the home and doing so requires considerable and taxing effort. Other absences are for medical reasons / religious services and are infrequent or of short duration when for other reasons). o If current dressing causes regression in wound condition, may D/C ordered dressing product/s and apply Normal Saline Moist Dressing daily until next Wound Healing Center / Other MD appointment. Notify Wound Healing Center of regression in wound condition at [phone]. o Please direct any NON-WOUND related issues/requests for orders to patient's Primary Care Physician Wound #6 Right,Posterior Lower Leg o Continue Home Health Visits - HHRN may use aquacel ag between toes if needed for drainage o Home Health Nurse may visit PRN to address patientos wound care needs. o FACE TO FACE ENCOUNTER: MEDICARE and MEDICAID PATIENTS: I certify that this patient is under my care and that I had a face-to-face  encounter that meets the physician face-to-face encounter requirements with this patient on this date. The encounter with the patient was in whole or in part for the following MEDICAL CONDITION: (primary reason for Home Healthcare) MEDICAL NECESSITY: I certify, that based on my findings, NURSING services are a medically necessary home health service. HOME BOUND STATUS: I certify that my clinical findings support that this patient is homebound (i.e., Due to illness or injury, pt requires aid of supportive devices such as crutches, cane, wheelchairs, walkers, the use of special transportation or the assistance of another person to leave their place of residence. There is a normal inability to leave the home and doing so requires considerable and taxing effort. Other absences are for medical reasons / religious services and are infrequent or of short duration when for other reasons). o If current dressing causes regression in wound condition, may D/C ordered dressing product/s and apply Normal Saline Moist Dressing daily until next Wound Healing Center / Other MD appointment. Notify Wound Healing Center of regression in wound condition at [phone]. o Please direct any NON-WOUND related issues/requests for orders to patient's Primary Care Physician Wound #9 Right,Anterior Lower Leg o Continue Home Health Visits - HHRN may use aquacel ag between toes if needed for drainage OSTROFF, Emile ([id number]) o Home Health Nurse may visit PRN to address patientos wound care needs. o FACE TO FACE ENCOUNTER: MEDICARE and MEDICAID PATIENTS: I certify that this patient is under my care and that I had a face-to-face encounter that meets the physician face-to-face encounter requirements with this patient on this date. The encounter with the patient was in whole or in part for the following MEDICAL CONDITION: (primary reason for Home Healthcare) MEDICAL NECESSITY: I certify, that based on my  findings, NURSING services are a medically necessary home health service. HOME BOUND STATUS: I certify that my clinical findings support that this patient is homebound (i.e., Due to illness or injury, pt requires aid of supportive devices such as crutches, cane, wheelchairs, walkers, the use of special transportation or the assistance of another person to leave their place of residence. There is a normal inability to leave the home and doing so requires considerable and taxing effort. Other absences are for medical reasons / religious services and are infrequent or of short duration when for other reasons). o If current dressing causes regression in wound condition, may D/C ordered dressing product/s and apply Normal Saline Moist Dressing daily until next Wound  Healing Center / Other MD appointment. Notify Wound Healing Center of regression in wound condition at [phone]. o Please direct any NON-WOUND related issues/requests for orders to patient's Primary Care Physician Electronic Signature(s) Signed: 09/16/2015 4:08:32 PM By: Evlyn Kanner MD, FACS Signed: 09/16/2015 4:41:48 PM By: Curtis Sites Entered By: Curtis Sites on 09/16/2015 10:39:13 [name] ([id number]) -------------------------------------------------------------------------------- Problem List Details Patient Name: [name], [name] 09/16/2015 10:00 Date of Service: AM Medical Record [id number] Number: Patient Account Number: [id number] 1921-09-06 (81 y.o. 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 02/11/2015 Yes fat layer exposed L97.812 Non-pressure chronic ulcer of other part of right lower leg 02/11/2015 Yes with fat layer exposed T81.31XA Disruption of external operation (surgical) wound, not 02/18/2015 Yes elsewhere classified, initial encounter Inactive Problems Resolved Problems Electronic Signature(s) Signed: 09/16/2015 10:41:58 AM By: Evlyn Kanner MD, FACS Entered By: Evlyn Kanner on 09/16/2015 10:41:57 [name] ([id number]) -------------------------------------------------------------------------------- Progress Note Details Patient Name: [name], [name] 09/16/2015 10:00 Date of Service: AM Medical Record [id number] Number: Patient Account Number: [id number] 1920-11-27 (81 y.o. Treating RN: Curtis Sites Date of Birth/Sex: Female) Other Clinician: Primary Care Physician: Terance Hart, DAVID Treating Arrionna Serena Referring Physician: Dorothey Baseman Physician/Extender: Tania Ade in Treatment: 31 Subjective Chief Complaint Information obtained from Patient Patient presents to the wound care center for a consult due non healing wound. 81 year old patient with comes with a history of a ulcerated area to the right third toe and the right heel which she's had for about 2 months. History of Present Illness (HPI) The following HPI elements were documented for the patient's wound: Location: right medial heel and right third toe Quality: Patient reports experiencing a dull pain to affected area(s). Severity: Patient states wound are getting worse. Duration: Patient has had the wound for > 3 months prior to seeking treatment at the wound center Timing: Pain in wound is Intermittent (comes and goes Context: The wound appeared gradually over time Modifying Factors: Consults to this date include:vascular surgeon and several recent  surgeries. Associated Signs and Symptoms: Patient reports having foul odor and drainage from the left below-knee amputation site. A pleasant 81 year old patient who is known to have diabetes mellitus for several years has recently gone through a series of operations with the vascular surgeon Dr. Gilda Crease. In January and February she had several surgeries on the left lower extremity with an attempt to have limb salvage for gangrenous changes of her forefoot. 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 On 01/07/2015 she also had a right lower extremity distal runoff with a angioplasty of the right anterior tibial artery to maximize her blood flow to the foot. She recently had her staples removed at Dr. Marijean Heath office on 01/31/2015 and at that time a right lower extremity duplex was done which showed patent vessels and a patent stent with distal occluded posterior tibial artery. Though the duplex was noncritical the recommendations from the PA at the vascular surgery office was that of a angiogram to be done but the patient said she  would rather try some bone care before. The patient has received doxycycline and Cipro in the recent past and she takes oral medications for her diabetes. Other than that I reviewed her list of all her medications. No recent hemoglobin A1c has been done and no recent x-rays of the right foot have been done. 02/18/2015 -- x-ray of the right foot was done on 02/11/2015 and it shows no evidence of acute osteomyelitis of the third toe or of the calcaneus. She had gone to the vascular surgery office and they had noted that there is dehiscence of the left part of [name], Tanylah ([id number]) the amputation site of the below-knee wound and they have asked [place] to kindly take over the care of this. There've been doing dressings for the right heel and right third toe. 02/25/2015 -- we have received notes  from the vascular group who saw her last on 02/13/2015 and she was seen by the PA Ms. Cleda Daub. She had recommended that the patient continue to follow with [place] for wound care including management of the left BKA stump, which has had dehiscence of the lateral part. They will consider repeating a arterial duplex or angiogram if the right lower extremity does not heal within a reasonable period of time. 03/18/2015 -- he saw the vascular surgeon Dr. Gilda Crease and he has set her up for an angioplasty sometime in the middle of July. she is doing well otherwise. 04/10/2015 -- the patient had a procedure done on 04/08/2015 and this was a right angioplasty of the dorsalis pedis, anterior tibial artery, first superficial femoral artery in its midportion. There was successful intervention with recanalization and in-line flow to the right foot. 04/22/2015 -- the patient was looking rather pale today and the daughter confirms that her hemoglobin was down to 6.9. she is being monitored by her PCP. Her Lasix dose was increased and her edema has gone down significantly. 04/29/2015 -- she had vascular studies done and the ABI on the right is 1.3 and the toe pressures were within normal limits. Her hemoglobin is still around 7 and she refuses to take any blood transfusions for religious reasons. Her potassium was normal. 06/10/2015 -- she has a new blister on her right lateral and posterior part of her leg just in the region where the Kerlix bandages were applied and this may be due to an abrasion. 06/24/2015 -- On her right lower extremity she has developed several more blisters which look like small pustules and the drain informed shallow ulcerations. 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 07/01/2015 -- she has an appointment with the PCP this Friday and her vascular surgeon on Monday. Other than that she is on doxycycline and has finished 1 week of treatment. The pustules she had all over have now  resolved. 07/08/2015 -- she saw the vascular surgeon who did studies in the office and found that there is poor circulation in the distal lower right leg and has set her up for an angiogram and possible stenting next Tuesday. 07/22/2015 -- on October 18 she was taken up for a successful revascularization of the anterior tibial vessel on the right side. a PTA and stent placement was done to the right anterior tibial artery. 08/19/2015 -- she has broken out with some linear ulceration in the region of her webspaces of her toes and this is something new. 08/26/2015 -- over the last 3 days there was a streak of redness going from her lower extremity towards her toes but she had  no fever or discharge from the wounds. 09/02/2015 -- the redness and cellulitis has gone down but she continues to have a lot of edema of the right lower extremity. Objective [name], [name] ([id number]) Constitutional Pulse regular. Respirations normal and unlabored. Afebrile. Vitals Time Taken: 10:10 AM, Height: 62 in, Weight: 155 lbs, BMI: 28.3, Temperature: 98.2 F, Pulse: 45 bpm, Respiratory Rate: 18 breaths/min, Blood Pressure: 126/40 mmHg. Eyes Nonicteric. Reactive to light. Ears, Nose, Mouth, and Throat Lips, teeth, and gums WNL.Marland Kitchen Moist mucosa without lesions. Neck supple and nontender. No palpable supraclavicular or cervical adenopathy. Normal sized without goiter. Respiratory WNL. No retractions.. Cardiovascular Pedal Pulses WNL. No clubbing, cyanosis or edema. Lymphatic No adneopathy. No adenopathy. No adenopathy. Musculoskeletal Adexa without tenderness or enlargement.. Digits and nails w/o clubbing, cyanosis, infection, petechiae, ischemia, or inflammatory conditions.Marland Kitchen Psychiatric Judgement and insight Intact.. No evidence of depression, anxiety, or agitation.. General Notes: the medial right calcaneal area has just dry eschar but otherwise looking very good. Some of her other wounds need  subcutaneous debridement where there is some slough and this is done with a curette. Integumentary (Hair, Skin) No suspicious lesions. No crepitus or fluctuance. No peri-wound warmth or erythema. No masses.. Wound #1 status is Open. Original cause of wound was Gradually Appeared. The wound is located on the Right Calcaneous. The wound measures 0.1cm length x 0.3cm width x 0.1cm depth; 0.024cm^2 area and 0.002cm^3 volume. The wound is limited to skin breakdown. There is no tunneling or undermining noted. There is a medium amount of serosanguineous drainage noted. The wound margin is distinct with the outline attached to the wound base. There is large (67-100%) pink, pale granulation within the wound bed. There is a small (1-33%) amount of necrotic tissue within the wound bed including Adherent Slough. The periwound skin appearance exhibited: Localized Edema, Moist. The periwound skin appearance did not exhibit: Callus, Crepitus, Excoriation, Fluctuance, Friable, Induration, Rash, Scarring, Dry/Scaly, Maceration, Atrophie Blanche, Cyanosis, Ecchymosis, Hemosiderin Staining, Mottled, Pallor, Rubor, Erythema. The periwound has tenderness on palpation. [name], [name] ([id number]) Wound #10 status is Open. Original cause of wound was Gradually Appeared. The wound is located on the Right,Distal,Anterior Lower Leg. The wound measures 0.3cm length x 0.3cm width x 0.1cm depth; 0.071cm^2 area and 0.007cm^3 volume. The wound is limited to skin breakdown. There is no tunneling or undermining noted. There is a medium amount of serous drainage noted. The wound margin is distinct with the outline attached to the wound base. There is large (67-100%) pink, pale granulation within the wound bed. There is a small (1-33%) amount of necrotic tissue within the wound bed including Adherent Slough. The periwound skin appearance exhibited: Localized Edema, Moist. The periwound skin appearance did not exhibit: Callus,  Crepitus, Excoriation, Fluctuance, Friable, Induration, Rash, Scarring, Dry/Scaly, Maceration, Atrophie Blanche, Cyanosis, Ecchymosis, Hemosiderin Staining, Mottled, Pallor, Rubor, Erythema. Periwound temperature was noted as No Abnormality. The periwound has tenderness on palpation. Wound #6 status is Open. Original cause of wound was Blister. The wound is located on the Right,Posterior Lower Leg. The wound measures 2.2cm length x 1.3cm width x 0.2cm depth; 2.246cm^2 area and 0.449cm^3 volume. The wound is limited to skin breakdown. There is no tunneling or undermining noted. There is a medium amount of serosanguineous drainage noted. The wound margin is flat and intact. There is medium (34-66%) pink granulation within the wound bed. There is a medium (34-66%) amount of necrotic tissue within the wound bed including Adherent Slough. The periwound skin appearance exhibited: Localized Edema, Moist. The periwound  skin appearance did not exhibit: Callus, Crepitus, Excoriation, Fluctuance, Friable, Induration, Rash, Scarring, Dry/Scaly, Maceration, Atrophie Blanche, Cyanosis, Ecchymosis, Hemosiderin Staining, Mottled, Pallor, Rubor, Erythema. The periwound has tenderness on palpation. Wound #9 status is Open. Original cause of wound was Gradually Appeared. The wound is located on the Right,Anterior Lower Leg. The wound measures 0.6cm length x 0.5cm width x 0.1cm depth; 0.236cm^2 area and 0.024cm^3 volume. The wound is limited to skin breakdown. There is no tunneling or undermining noted. There is a medium amount of serosanguineous drainage noted. The wound margin is distinct with the outline attached to the wound base. There is medium (34-66%) pink, pale granulation within the wound bed. There is a small (1-33%) amount of necrotic tissue within the wound bed including Adherent Slough. The periwound skin appearance exhibited: Localized Edema, Moist. Periwound temperature was noted as No Abnormality.  The periwound has tenderness on palpation. 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 Medihoney will be used on the other wounds and silver alginate rope between the toes. She will come back and see me in 2 weeks' time. 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 There was a Skin/Dermis Open Wound/Selective [phone]) debridement with total area of 0.03 sq cm performed by Evlyn Kanner, MD. with the following instrument(s): Curette to remove Non-Viable tissue/material including Fibrin/Slough, Eschar, and Callus after achieving pain control using Lidocaine 4% Topical Solution. A time out was conducted prior to the start of the procedure. There was no bleeding. The procedure was tolerated well with a pain level of 0 throughout and a pain level of 0 following the procedure. Post Debridement Measurements: 0.1cm length x 0.3cm width x 0.1cm depth; 0.002cm^3 volume. Post procedure Diagnosis Wound #1: Same as Pre-Procedure Wound #6 Wound #6 is an Arterial Insufficiency Ulcer located on the Right,Posterior Lower Leg . There was a Skin/Subcutaneous Tissue Debridement ([phone]) debridement with total area of 2.86 sq cm performed by Evlyn Kanner, MD. with the following instrument(s): Curette to remove Viable and Non-Viable tissue/material including Fibrin/Slough and Subcutaneous  after achieving pain control using Lidocaine 4% Topical Solution. A time out was conducted prior to the start of the procedure. A Minimum amount of bleeding was controlled with Pressure. The procedure was tolerated well with a pain level of 0 throughout and a pain level of 0 following the procedure. Post Debridement Measurements: 2.2cm length x 1.3cm width x 0.2cm depth; 0.449cm^3 volume. Post procedure Diagnosis Wound #6: Same as Pre-Procedure Plan Wound Cleansing: Wound #1 Right Calcaneous: Cleanse wound with mild soap and water May Shower, gently pat wound dry prior to applying new dressing. Wound #10 Right,Distal,Anterior Lower Leg: Cleanse wound with mild soap and water May Shower, gently pat wound dry prior to applying new dressing. Wound #6 Right,Posterior Lower Leg: Cleanse wound with mild soap and water May Shower, gently pat wound dry prior to applying new dressing. Wound #9 Right,Anterior Lower Leg: Cleanse wound with mild soap and water [name], [name] (  [id number]) May Shower, gently pat wound dry prior to applying new dressing. Primary Wound Dressing: Wound #10 Right,Distal,Anterior Lower Leg: Medihoney gel Wound #6 Right,Posterior Lower Leg: Medihoney gel Wound #9 Right,Anterior Lower Leg: Medihoney gel Secondary Dressing: Wound #1 Right Calcaneous: Boardered Foam Dressing Wound #10 Right,Distal,Anterior Lower Leg: Gauze and Kerlix/Conform Wound #6 Right,Posterior Lower Leg: Gauze and Kerlix/Conform Wound #9 Right,Anterior Lower Leg: Gauze and Kerlix/Conform Dressing Change Frequency: Wound #1 Right Calcaneous: Other: - twice weekly Wound #10 Right,Distal,Anterior Lower Leg: Change dressing every day. Wound #6 Right,Posterior Lower Leg: Change dressing every day. Wound #9 Right,Anterior Lower Leg: Change dressing every day. Follow-up Appointments: Wound #1 Right Calcaneous: Return Appointment in 2 weeks. Wound #10 Right,Distal,Anterior Lower Leg: Return  Appointment in 2 weeks. Wound #6 Right,Posterior Lower Leg: Return Appointment in 2 weeks. Wound #9 Right,Anterior Lower Leg: Return Appointment in 2 weeks. Home Health: Wound #1 Right Calcaneous: Continue Home Health Visits - [facility] may use aquacel ag between toes if needed for drainage Home Health Nurse may visit PRN to address patient s wound care needs. FACE TO FACE ENCOUNTER: MEDICARE and MEDICAID PATIENTS: I certify that this patient is under my care and that I had a face-to-face encounter that meets the physician face-to-face encounter requirements with this patient on this date. The encounter with the patient was in whole or in part for the following MEDICAL CONDITION: (primary reason for Home Healthcare) MEDICAL NECESSITY: I certify, that based on my findings, NURSING services are a medically necessary home health service. HOME BOUND STATUS: I certify that my clinical findings support that this patient is homebound (i.e., Due to illness or injury, pt requires aid of supportive devices such as crutches, cane, wheelchairs, walkers, the use of special transportation or the assistance of another person to leave their place of residence. There is a normal inability to leave the home and doing so requires considerable and taxing effort. Other absences are for medical reasons / religious services and are infrequent or of short duration when for other reasons). If current dressing causes regression in wound condition, may D/C ordered dressing product/s and apply Normal Saline Moist Dressing daily until next Wound Healing Center / Other MD appointment. Notify Wound Winegarden, Vaishnavi ([id number]) Healing Center of regression in wound condition at [phone]. Please direct any NON-WOUND related issues/requests for orders to patient's Primary Care Physician Wound #10 Right,Distal,Anterior Lower Leg: Continue Home Health Visits - [facility] may use aquacel ag between toes if needed for drainage Home  Health Nurse may visit PRN to address patient s wound care needs. FACE TO FACE ENCOUNTER: MEDICARE and MEDICAID PATIENTS: I certify that this patient is under my care and that I had a face-to-face encounter that meets the physician face-to-face encounter requirements with this patient on this date. The encounter with the patient was in whole or in part for the following MEDICAL CONDITION: (primary reason for Home Healthcare) MEDICAL NECESSITY: I certify, that based on my findings, NURSING services are a medically necessary home health service. HOME BOUND STATUS: I certify that my clinical findings support that this patient is homebound (i.e., Due to illness or injury, pt requires aid of supportive devices such as crutches, cane, wheelchairs, walkers, the use of special transportation or the assistance of another person to leave their place of residence. There is a normal inability to leave the home and doing so requires considerable and taxing effort. Other absences are for medical reasons / religious services and are infrequent or of short duration when for other  reasons). If current dressing causes regression in wound condition, may D/C ordered dressing product/s and apply Normal Saline Moist Dressing daily until next Wound Healing Center / Other MD appointment. Notify Wound Healing Center of regression in wound condition at [phone]. Please direct any NON-WOUND related issues/requests for orders to patient's Primary Care Physician Wound #6 Right,Posterior Lower Leg: Continue Home Health Visits - [facility] may use aquacel ag between toes if needed for drainage Home Health Nurse may visit PRN to address patient s wound care needs. FACE TO FACE ENCOUNTER: MEDICARE and MEDICAID PATIENTS: I certify that this patient is under my care and that I had a face-to-face encounter that meets the physician face-to-face encounter requirements with this patient on this date. The encounter with the patient was  in whole or in part for the following MEDICAL CONDITION: (primary reason for Home Healthcare) MEDICAL NECESSITY: I certify, that based on my findings, NURSING services are a medically necessary home health service. HOME BOUND STATUS: I certify that my clinical findings support that this patient is homebound (i.e., Due to illness or injury, pt requires aid of supportive devices such as crutches, cane, wheelchairs, walkers, the use of special transportation or the assistance of another person to leave their place of residence. There is a normal inability to leave the home and doing so requires considerable and taxing effort. Other absences are for medical reasons / religious services and are infrequent or of short duration when for other reasons). If current dressing causes regression in wound condition, may D/C ordered dressing product/s and apply Normal Saline Moist Dressing daily until next Wound Healing Center / Other MD appointment. Notify Wound Healing Center of regression in wound condition at [phone]. Please direct any NON-WOUND related issues/requests for orders to patient's Primary Care Physician Wound #9 Right,Anterior Lower Leg: Continue Home Health Visits - [facility] may use aquacel ag between toes if needed for drainage Home Health Nurse may visit PRN to address patient s wound care needs. FACE TO FACE ENCOUNTER: MEDICARE and MEDICAID PATIENTS: I certify that this patient is under my care and that I had a face-to-face encounter that meets the physician face-to-face encounter requirements with this patient on this date. The encounter with the patient was in whole or in part for the following MEDICAL CONDITION: (primary reason for Home Healthcare) MEDICAL NECESSITY: I certify, that based on my findings, NURSING services are a medically necessary home health service. HOME BOUND STATUS: I certify that my clinical findings support that this patient is homebound (i.e., Due to illness or  injury, pt requires aid of supportive devices such as crutches, cane, wheelchairs, walkers, the use of special transportation or the assistance of another person to leave their place of residence. There is a normal inability to leave the home and doing so requires considerable and taxing effort. Other absences are for medical reasons / religious services and are infrequent or of short duration when for other reasons). If current dressing causes regression in wound condition, may D/C ordered dressing product/s and apply Normal Saline Moist Dressing daily until next Wound Healing Center / Other MD appointment. Notify Wound Nyman, Annessa ([id number]) Healing Center of regression in wound condition at [phone]. Please direct any NON-WOUND related issues/requests for orders to patient's Primary Care Physician I have recommended a bordered foam to the medial calcaneal region. Medihoney will be used on the other wounds and silver alginate rope between the toes. She will come back and see me in 2 weeks' time. Electronic Signature(s) Signed: 09/16/2015  10:45:00 AM By: Evlyn Kanner MD, FACS Entered By: Evlyn Kanner on 09/16/2015 10:45:00 [name] ([id number]) -------------------------------------------------------------------------------- SuperBill Details Patient Name: [name] Date of Service: 09/16/2015 Medical Record Number: [id number] Patient Account Number: [id number] Date of Birth/Sex: Jul 31, 1921 (81 y.o. Female) Treating RN: Curtis Sites Primary Care Physician: Dorothey Baseman Other Clinician: Referring Physician: Dorothey Baseman Treating Physician/Extender: Rudene Re in Treatment: 31 Diagnosis Coding ICD-10 Codes Code Description E11.621 Type 2 diabetes mellitus with foot ulcer I70.235 Atherosclerosis of native arteries of right leg with ulceration of other part of foot Z89.512 Acquired absence of left leg below knee L97.412 Non-pressure chronic ulcer of  right heel and midfoot with fat layer exposed L97.812 Non-pressure chronic ulcer of other part of right lower leg with fat layer exposed Disruption of external operation (surgical) wound, not elsewhere classified, initial T81.31XA encounter Facility Procedures CPT4: Description Modifier Quantity Code [id number] 11042 - DEB SUBQ TISSUE 20 SQ CM/< 1 ICD-10 Description Diagnosis L97.812 Non-pressure chronic ulcer of other part of right lower leg with fat layer exposed E11.621 Type 2 diabetes mellitus with foot  ulcer I70.235 Atherosclerosis of native arteries of right leg with ulceration of other part of foot 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 CPT4: [id number] - DEBRIDE WOUND 1ST 20 SQ CM OR < 59 1 ICD-10 Description Diagnosis I70.235 Atherosclerosis of native arteries of right leg with ulceration of other part of foot L97.412 Non-pressure chronic ulcer of right heel and midfoot with fat  layer exposed E11.621 Type 2 diabetes mellitus with foot ulcer L97.812 Non-pressure chronic ulcer of other part of right lower leg with fat layer exposed Physician Procedures : Ulice Brilliant DescriptionZannie Cove ([id number]) Modifier: Quantity: Electronic Signature(s) Signed: 09/16/2015 10:45:41 AM By: Evlyn Kanner MD, FACS Entered By: Evlyn Kanner on 09/16/2015 10:45:41

## 2015-09-30 ENCOUNTER — Encounter: Payer: Medicare Other | Attending: Surgery | Admitting: Surgery

## 2015-09-30 DIAGNOSIS — T8131XA Disruption of external operation (surgical) wound, not elsewhere classified, initial encounter: Secondary | ICD-10-CM | POA: Diagnosis not present

## 2015-09-30 DIAGNOSIS — L97812 Non-pressure chronic ulcer of other part of right lower leg with fat layer exposed: Secondary | ICD-10-CM | POA: Insufficient documentation

## 2015-09-30 DIAGNOSIS — I70235 Atherosclerosis of native arteries of right leg with ulceration of other part of foot: Secondary | ICD-10-CM | POA: Diagnosis not present

## 2015-09-30 DIAGNOSIS — E11621 Type 2 diabetes mellitus with foot ulcer: Secondary | ICD-10-CM | POA: Insufficient documentation

## 2015-09-30 DIAGNOSIS — L97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: Secondary | ICD-10-CM | POA: Insufficient documentation

## 2015-09-30 DIAGNOSIS — Z89512 Acquired absence of left leg below knee: Secondary | ICD-10-CM | POA: Insufficient documentation

## 2015-10-01 NOTE — Progress Notes (Signed)
ALVIRA, HECHT ([id number]) Visit Report for 09/30/2015 Arrival Information Details Patient Name: [name], [name] Date of Service: 09/30/2015 10:00 AM Medical Record Number: [id number] Patient Account Number: [id number] Date of Birth/Sex: 10-21-1920 (82 y.o. Female) Treating RN: Leonard Downing Primary Care Physician: Dorothey Baseman Other Clinician: Referring Physician: Dorothey Baseman Treating Physician/Extender: [name] in Treatment: 60 Visit Information History Since Last Visit All ordered tests and consults were completed: No Patient Arrived: Wheel Chair Added or deleted any medications: No Arrival Time: 10:06 Any new allergies or adverse reactions: No Accompanied By: daughter Had a fall or experienced change in No activities of daily living that may affect Transfer Assistance: Manual risk of falls: Patient Identification Verified: Yes Signs or symptoms of abuse/neglect since last No Secondary Verification Process Yes visito Completed: Hospitalized since last visit: No Patient Has Alerts: Yes Has Dressing in Place as Prescribed: Yes Patient Alerts: DMII Pain Present Now: No Electronic Signature(s) Signed: 09/30/2015 4:12:15 PM By: Lucrezia Starch, RN, Sendra Entered By: Lucrezia Starch RN, Sendra on 09/30/2015 10:08:09 [name] ([id number]) -------------------------------------------------------------------------------- Clinic Level of Care Assessment Details Patient Name: [name] Date of Service: 09/30/2015 10:00 AM Medical Record Number: [id number] Patient Account Number: [id number] Date of Birth/Sex: 09-12-1921 (82 y.o. Female) Treating RN: Huel Coventry Primary Care Physician: Terance Hart, DAVID Other Clinician: Referring Physician: Dorothey Baseman Treating Physician/Extender: [name] in Treatment: 33 Clinic Level of Care Assessment Items TOOL 4 Quantity Score []  - Use when only an EandM is performed on FOLLOW-UP visit 0 ASSESSMENTS - Nursing  Assessment / Reassessment []  - Reassessment of Co-morbidities (includes updates in patient status) 0 X - Reassessment of Adherence to Treatment Plan 1 5 ASSESSMENTS - Wound and Skin Assessment / Reassessment X - Simple Wound Assessment / Reassessment - one wound 1 5 []  - Complex Wound Assessment / Reassessment - multiple wounds 0 []  - Dermatologic / Skin Assessment (not related to wound area) 0 ASSESSMENTS - Focused Assessment []  - Circumferential Edema Measurements - multi extremities 0 []  - Nutritional Assessment / Counseling / Intervention 0 []  - Lower Extremity Assessment (monofilament, tuning fork, pulses) 0 []  - Peripheral Arterial Disease Assessment (using hand held doppler) 0 ASSESSMENTS - Ostomy and/or Continence Assessment and Care []  - Incontinence Assessment and Management 0 []  - Ostomy Care Assessment and Management (repouching, etc.) 0 PROCESS - Coordination of Care X - Simple Patient / Family Education for ongoing care 1 15 []  - Complex (extensive) Patient / Family Education for ongoing care 0 []  - Staff obtains Chiropractor, Records, Test Results / Process Orders 0 []  - Staff telephones HHA, Nursing Homes / Clarify orders / etc 0 []  - Routine Transfer to another Facility (non-emergent condition) 0 [name], [name] ([id number]) []  - Routine Hospital Admission (non-emergent condition) 0 []  - New Admissions / Manufacturing engineer / Ordering NPWT, Apligraf, etc. 0 []  - Emergency Hospital Admission (emergent condition) 0 X - Simple Discharge Coordination 1 10 []  - Complex (extensive) Discharge Coordination 0 PROCESS - Special Needs []  - Pediatric / Minor Patient Management 0 []  - Isolation Patient Management 0 []  - Hearing / Language / Visual special needs 0 []  - Assessment of Community assistance (transportation, D/C planning, etc.) 0 []  - Additional assistance / Altered mentation 0 []  - Support Surface(s) Assessment (bed, cushion, seat, etc.) 0 INTERVENTIONS - Wound  Cleansing / Measurement X - Simple Wound Cleansing - one wound 1 5 []  - Complex Wound Cleansing - multiple wounds 0 X - Wound Imaging (photographs - any number of wounds) 1 5 []  -  Wound Tracing (instead of photographs) 0 X - Simple Wound Measurement - one wound 1 5 []  - Complex Wound Measurement - multiple wounds 0 INTERVENTIONS - Wound Dressings X - Small Wound Dressing one or multiple wounds 1 10 []  - Medium Wound Dressing one or multiple wounds 0 []  - Large Wound Dressing one or multiple wounds 0 []  - Application of Medications - topical 0 []  - Application of Medications - injection 0 INTERVENTIONS - Miscellaneous []  - External ear exam 0 [name], [name] ([id number]) []  - Specimen Collection (cultures, biopsies, blood, body fluids, etc.) 0 []  - Specimen(s) / Culture(s) sent or taken to Lab for analysis 0 []  - Patient Transfer (multiple staff / Michiel Sites Lift / Similar devices) 0 []  - Simple Staple / Suture removal (25 or less) 0 []  - Complex Staple / Suture removal (26 or more) 0 []  - Hypo / Hyperglycemic Management (close monitor of Blood Glucose) 0 []  - Ankle / Brachial Index (ABI) - do not check if billed separately 0 X - Vital Signs 1 5 Has the patient been seen at the hospital within the last three years: Yes Total Score: 65 Level Of Care: New/Established - Level 2 Electronic Signature(s) Signed: 09/30/2015 12:06:19 PM By: Elliot Gurney, RN, BSN, Kim RN, BSN Entered By: Elliot Gurney, RN, BSN, Kim on 09/30/2015 10:33:01 [name] ([id number]) -------------------------------------------------------------------------------- Encounter Discharge Information Details Patient Name: [name] Date of Service: 09/30/2015 10:00 AM Medical Record Number: [id number] Patient Account Number: [id number] Date of Birth/Sex: [birth year] (82 y.o. Female) Treating RN: Leonard Downing Primary Care Physician: Dorothey Baseman Other Clinician: Referring Physician: Dorothey Baseman Treating  Physician/Extender: [name] in Treatment: 67 Encounter Discharge Information Items Discharge Pain Level: 6 Discharge Condition: Stable Ambulatory Status: Wheelchair Discharge Destination: Home Transportation: Private Auto Accompanied By: daughter Schedule Follow-up Appointment: Yes Medication Reconciliation completed Yes and provided to Patient/Care Arvid Marengo: Provided on Clinical Summary of Care: 09/30/2015 Form Type Recipient Paper Patient HS Electronic Signature(s) Signed: 09/30/2015 12:06:19 PM By: Elliot Gurney RN, BSN, Kim RN, BSN Previous Signature: 09/30/2015 10:43:33 AM Version By: Francie Massing Entered By: Elliot Gurney RN, BSN, Kim on 09/30/2015 10:44:46 [name] ([id number]) -------------------------------------------------------------------------------- Lower Extremity Assessment Details Patient Name: [name] Date of Service: 09/30/2015 10:00 AM Medical Record Number: [id number] Patient Account Number: [id number] Date of Birth/Sex: 02-24-1921 (82 y.o. Female) Treating RN: Leonard Downing Primary Care Physician: Dorothey Baseman Other Clinician: Referring Physician: Dorothey Baseman Treating Physician/Extender: [name] in Treatment: 33 Vascular Assessment Pulses: Posterior Tibial Dorsalis Pedis Palpable: [Right:Yes] Extremity colors, hair growth, and conditions: Temperature of Extremity: [Right:Warm] Capillary Refill: [Right:< 3 seconds] Dependent Rubor: [Right:No] Blanched when Elevated: [Right:No] Lipodermatosclerosis: [Right:No] Toe Nail Assessment [name]: Right: Thick: No Discolored: No Deformed: Yes Improper Length and Hygiene: No Electronic Signature(s) Signed: 09/30/2015 4:12:15 PM By: Lucrezia Starch, RN, Sendra Entered By: Lucrezia Starch RN, Sendra on 09/30/2015 10:15:45 [name] ([id number]) -------------------------------------------------------------------------------- Multi Wound Chart Details Patient Name: [name] Date of  Service: 09/30/2015 10:00 AM Medical Record Number: [id number] Patient Account Number: [id number] Date of Birth/Sex: [birth year] (82 y.o. 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] Comorbid History: [1:N/A] [10:N/A] [6:Arrhythmia, Deep Vein Thrombosis, Peripheral Arterial Disease, Type II Diabetes] Date Acquired: [1:11/04/2014] [10:08/31/2015] [6:06/12/2015] Weeks of Treatment: [1:33] [10:4] [6:15] Wound Status: [1:Healed - Epithelialized] [10:Healed - Epithelialized] [6:Open] Measurements L x W x D 0x0x0 [10:0x0x0] [6:6x1.8x0.2] (cm) Area (cm) : [1:0] [10:0] [6:8.482] Volume (cm) : [1:0] [10:0] [6:1.696] % Reduction in Area: [1:100.00%] [10:N/A] [6:-980.50%] % Reduction in Volume: 100.00% [10:N/A] [6:-2046.80%] Classification: [1:Full Thickness Without Exposed Support Structures] [10:Grade 1] [6:Full Thickness Without Exposed Support Structures] HBO Classification: [1:N/A] [10:N/A] [6:Grade 1] Exudate Amount: [1:N/A] [10:N/A] [6:Medium] Exudate Type: [1:N/A] [10:N/A] [6:Serosanguineous] Exudate Color: [1:N/A] [10:N/A] [6:red, brown] Wound Margin: [1:N/A] [10:N/A] [6:Flat and Intact] Granulation Amount: [1:N/A] [10:N/A] [6:Medium (34-66%)] Granulation Quality: [1:N/A] [10:N/A] [6:Pink] Necrotic Amount:  [1:N/A] [10:N/A] [6:Medium (34-66%)] Epithelialization: N/A N/A None Periwound Skin Texture: No Abnormalities Noted No Abnormalities Noted Edema: Yes Excoriation: No Induration: No Callus: No Crepitus: No Fluctuance: No Friable: No Rash: No Scarring: No Periwound Skin No Abnormalities Noted No Abnormalities Noted Moist: Yes Moisture: Maceration: No Dry/Scaly: No Periwound Skin Color: No Abnormalities Noted No Abnormalities Noted Atrophie Blanche: No Cyanosis: No Ecchymosis: No Erythema: No Hemosiderin Staining: No Mottled: No Pallor: No Rubor: No Tenderness on No No Yes Palpation: Wound Preparation: N/A N/A Ulcer Cleansing: Rinsed/Irrigated with Saline Topical Anesthetic Applied: Other: lidocaine 4% Wound Number: 9 N/A N/A Photos: No Photos N/A N/A Wound Location: Right, Anterior Lower Leg N/A N/A Wounding Event: Gradually Appeared N/A N/A Primary Etiology: Diabetic Wound/Ulcer of N/A N/A the Lower Extremity Comorbid History: N/A N/A N/A Date Acquired: 08/31/2015 N/A N/A Weeks of Treatment: 4 N/A N/A Wound Status: Healed - Epithelialized N/A N/A Measurements L x W x D 0x0x0 N/A N/A (cm) Area (cm) : 0 N/A N/A Volume (cm) : 0 N/A N/A % Reduction in Area: N/A N/A N/A % Reduction in Volume: N/A N/A N/A Classification: Grade 1 N/A N/A HBO Classification: N/A N/A N/A Exudate Amount: N/A N/A N/A [name], [name] ([id number]) Exudate Type: N/A N/A N/A Exudate Color: N/A N/A N/A Wound Margin: N/A N/A N/A Granulation Amount: N/A N/A N/A Granulation Quality: N/A N/A N/A Necrotic Amount: N/A N/A N/A Exposed Structures: N/A N/A N/A Epithelialization: N/A N/A N/A Periwound Skin Texture: No Abnormalities Noted N/A N/A Periwound Skin No Abnormalities Noted N/A N/A Moisture: Periwound Skin Color: No Abnormalities Noted N/A N/A Tenderness on No N/A N/A Palpation: Wound Preparation: N/A N/A N/A Treatment Notes Electronic Signature(s) Signed: 09/30/2015 12:06:19 PM  By: Elliot GurneyWoody, RN, BSN, Kim RN, BSN Entered By: Elliot GurneyWoody, RN, BSN, Kim on 09/30/2015 10:29:32 [name] NationsSILETZKY, [name] ([id number]) -------------------------------------------------------------------------------- Multi-Disciplinary Care Plan Details Patient Name: [name] NationsSILETZKY, Roy Date of Service: 09/30/2015 10:00 AM Medical Record Number: [id number] Patient Account Number: [id number] Date of Birth/Sex: 11/30/1920 (82 y.o. Female) Treating RN: Huel CoventryWoody, Kim Primary Care Physician: Dorothey BasemanBRONSTEIN, DAVID Other Clinician: Referring Physician: Dorothey BasemanBRONSTEIN, DAVID Treating Physician/Extender: [name] ReBritto, Errol Weeks in Treatment: 233 Active Inactive Abuse / Safety / Falls / Self Care Management Nursing Diagnoses: Impaired physical mobility Potential for falls Goals: Patient will remain injury free Date Initiated: 02/11/2015 Goal Status: Active Patient/caregiver will verbalize understanding of skin care regimen Date Initiated: 02/11/2015 Goal Status: Active Patient/caregiver will verbalize/demonstrate measures taken to prevent injury and/or falls Date Initiated: 02/11/2015 Goal Status: Active Patient/caregiver will verbalize/demonstrate understanding of what to do in case of emergency Date Initiated: 02/11/2015 Goal Status: Active Interventions: Assess fall risk on admission and as needed Provide education on fall prevention Provide education on safe transfers Treatment Activities: Patient  referred to home care : 09/30/2015 Notes: Nutrition Nursing Diagnoses: Imbalanced nutrition Potential for alteratiion in Nutrition/Potential for imbalanced nutrition Ellerman, Tashawn ([id number]) Goals: Patient/caregiver verbalizes understanding of need to maintain therapeutic glucose control per primary care physician Date Initiated: 02/11/2015 Goal Status: Active Patient/caregiver will maintain therapeutic glucose control Date Initiated: 02/11/2015 Goal Status: Active Interventions: Assess HgA1c results as ordered upon admission  and as needed Provide education on elevated blood sugars and impact on wound healing Provide education on nutrition Treatment Activities: Education provided on Nutrition : 04/22/2015 Obtain HgA1c : 09/30/2015 Notes: Wound/Skin Impairment Nursing Diagnoses: Impaired tissue integrity Knowledge deficit related to ulceration/compromised skin integrity Goals: Patient/caregiver will verbalize understanding of skin care regimen Date Initiated: 02/11/2015 Goal Status: Active Ulcer/skin breakdown will heal within 14 weeks Date Initiated: 02/11/2015 Goal Status: Active Interventions: Assess patient/caregiver ability to obtain necessary supplies Assess patient/caregiver ability to perform ulcer/skin care regimen upon admission and as needed Assess ulceration(s) every visit Provide education on ulcer and skin care Treatment Activities: Skin care regimen initiated : 09/30/2015 Topical wound management initiated : 09/30/2015 Notes: SAMAYRA, HEBEL ([id number]) Electronic Signature(s) Signed: 09/30/2015 12:06:19 PM By: Elliot Gurney, RN, BSN, Kim RN, BSN Entered By: Elliot Gurney, RN, BSN, Kim on 09/30/2015 10:28:53 [name] ([id number]) -------------------------------------------------------------------------------- Pain Assessment Details Patient Name: [name] Date of Service: 09/30/2015 10:00 AM Medical Record Number: [id number] Patient Account Number: [id number] Date of Birth/Sex: 1921-04-01 (82 y.o. Female) Treating RN: Leonard Downing Primary Care Physician: Dorothey Baseman Other Clinician: Referring Physician: Dorothey Baseman Treating Physician/Extender: [name] in Treatment: 60 Active Problems Location of Pain Severity and Description of Pain Patient Has Paino Yes Site Locations Pain Location: Pain in Ulcers Pain Management and Medication Current Pain Management: Electronic Signature(s) Signed: 09/30/2015 4:12:15 PM By: Lucrezia Starch, RN, Sendra Entered By: Lucrezia Starch RN, Sendra on  09/30/2015 10:09:49 [name] ([id number]) -------------------------------------------------------------------------------- Patient/Caregiver Education Details Patient Name: [name] Date of Service: 09/30/2015 10:00 AM Medical Record Number: [id number] Patient Account Number: [id number] Date of Birth/Gender: 1921/01/19 (82 y.o. Female) Treating RN: Huel Coventry Primary Care Physician: Dorothey Baseman Other Clinician: Referring Physician: Dorothey Baseman Treating Physician/Extender: [name] in Treatment: 56 Education Assessment Education Provided To: Patient and Caregiver daughter Education Topics Provided Wound/Skin Impairment: Handouts: Caring for Your Ulcer, Skin Care Do's and Dont's, Other: santyl application Methods: Demonstration, Explain/Verbal Responses: State content correctly Electronic Signature(s) Signed: 09/30/2015 12:06:19 PM By: Elliot Gurney, RN, BSN, Kim RN, BSN Entered By: Elliot Gurney, RN, BSN, Kim on 09/30/2015 10:45:59 [name] ([id number]) -------------------------------------------------------------------------------- Wound Assessment Details Patient Name: [name] Date of Service: 09/30/2015 10:00 AM Medical Record Number: [id number] Patient Account Number: [id number] Date of Birth/Sex: [birth year] (82 y.o. Female) Treating RN: Leonard Downing Primary Care Physician: Dorothey Baseman Other Clinician: Referring Physician: Dorothey Baseman Treating Physician/Extender: [name] in Treatment: 33 Wound Status Wound Number: 1 Primary Etiology: Arterial Insufficiency Ulcer Wound Location: Right Calcaneous Wound Status: Healed - Epithelialized Wounding Event: Gradually Appeared Date Acquired: 11/04/2014 Weeks Of Treatment: 33 Clustered Wound: No Photos Photo Uploaded By: Elliot Gurney, RN, BSN, Kim on 09/30/2015 16:40:02 Wound Measurements Length: (cm) 0 % Reducti Width: (cm) 0 % Reducti Depth: (cm) 0 Area: (cm) 0 Volume: (cm)  0 on in Area: 100% on in Volume: 100% Wound Description Full Thickness Without Exposed Classification: Support Structures Periwound Skin Texture Texture Color No Abnormalities Noted: No No Abnormalities Noted: No Moisture No Abnormalities Noted: No Electronic Signature(s) Signed: 09/30/2015 4:12:15 PM By: Lucrezia Starch RN, Alanson Aly, Navie ([id number]) Entered By: Maureen Chatters on 09/30/2015 10:17:41  SHANICKA, OLDENKAMP ([id number]) -------------------------------------------------------------------------------- Wound Assessment Details Patient Name: [name], [name] Date of Service: 09/30/2015 10:00 AM Medical Record Number: [id number] Patient Account Number: [id number] Date of Birth/Sex: Oct 19, 1920 (82 y.o. Female) Treating RN: Huel Coventry Primary Care Physician: Dorothey Baseman Other Clinician: Referring Physician: Terance Hart, DAVID Treating Physician/Extender: [name] in Treatment: 33 Wound Status Wound Number: 10 Primary Diabetic Wound/Ulcer of the Lower Etiology: Extremity Wound Location: Right, Distal, Anterior Lower Leg Wound Status: Healed - Epithelialized Wounding Event: Gradually Appeared Date Acquired: 08/31/2015 Weeks Of Treatment: 4 Clustered Wound: No Photos Photo Uploaded By: Elliot Gurney, RN, BSN, Kim on 09/30/2015 16:40:02 Wound Measurements Length: (cm) Width: (cm) Depth: (cm) Area: (cm) Volume: (cm) 0 % Reduction in Area: 0 % Reduction in Volume: 0 0 0 Wound Description Classification: Grade 1 Periwound Skin Texture Texture Color No Abnormalities Noted: No No Abnormalities Noted: No Moisture No Abnormalities Noted: No Electronic Signature(s) Signed: 09/30/2015 12:06:19 PM By: Elliot Gurney, RN, BSN, Kim RN, BSN [name], [name] ([id number]) Entered By: Elliot Gurney, RN, BSN, Kim on 09/30/2015 10:28:23 [name] ([id number]) -------------------------------------------------------------------------------- Wound Assessment Details Patient Name:  [name] Date of Service: 09/30/2015 10:00 AM Medical Record Number: [id number] Patient Account Number: [id number] Date of Birth/Sex: 08-03-1921 (82 y.o. Female) Treating RN: Leonard Downing Primary Care Physician: Dorothey Baseman Other Clinician: Referring Physician: Dorothey Baseman Treating Physician/Extender: [name] in Treatment: 33 Wound Status Wound Number: 6 Primary Arterial Insufficiency Ulcer Etiology: Wound Location: Right Lower Leg - Posterior Wound Open Wounding Event: Blister Status: Date Acquired: 06/12/2015 Comorbid Arrhythmia, Deep Vein Thrombosis, Weeks Of Treatment: 15 History: Peripheral Arterial Disease, Type II Clustered Wound: No Diabetes Photos Photo Uploaded By: Elliot Gurney, RN, BSN, Kim on 09/30/2015 16:40:03 Wound Measurements Length: (cm) 6 Width: (cm) 1.8 Depth: (cm) 0.2 Area: (cm) 8.482 Volume: (cm) 1.696 % Reduction in Area: -980.5% % Reduction in Volume: -2046.8% Epithelialization: None Wound Description Full Thickness Without Exposed Classification: Support Structures Diabetic Severity Grade 1 (Wagner): Wound Margin: Flat and Intact Exudate Amount: Medium Exudate Type: Serosanguineous Exudate Color: red, brown Foul Odor After Cleansing: No Wound Bed Granulation Amount: Medium (34-66%) Exposed Structure [name], [name] ([id number]) Granulation Quality: Pink Fascia Exposed: No Necrotic Amount: Medium (34-66%) Fat Layer Exposed: No Necrotic Quality: Adherent Slough Tendon Exposed: No Muscle Exposed: No Joint Exposed: No Bone Exposed: No Limited to Skin Breakdown Periwound Skin Texture Texture Color No Abnormalities Noted: No No Abnormalities Noted: No Callus: No Atrophie Blanche: No Crepitus: No Cyanosis: No Excoriation: No Ecchymosis: No Fluctuance: No Erythema: No Friable: No Hemosiderin Staining: No Induration: No Mottled: No Localized Edema: Yes Pallor: No Rash: No Rubor: No Scarring: No  Temperature / Pain Moisture Tenderness on Palpation: Yes No Abnormalities Noted: No Dry / Scaly: No Maceration: No Moist: Yes Wound Preparation Ulcer Cleansing: Rinsed/Irrigated with Saline Topical Anesthetic Applied: Other: lidocaine 4%, Treatment Notes Wound #6 (Right, Posterior Lower Leg) 1. Cleansed with: Clean wound with Normal Saline 2. Anesthetic Liquid 4% Topical Lidocaine Solution 3. Peri-wound Care: Skin Prep 4. Dressing Applied: Santyl Ointment 5. Secondary Dressing Applied Guaze, ABD and kerlix/Conform Electronic Signature(s) Signed: 09/30/2015 4:12:15 PM By: Lucrezia Starch, RN, Sendra Entered By: Lucrezia Starch RN, Sendra on 09/30/2015 10:23:00 [name] ([id number]) [name], [name] ([id number]) -------------------------------------------------------------------------------- Wound Assessment Details Patient Name: [name] Date of Service: 09/30/2015 10:00 AM Medical Record Number: [id number] Patient Account Number: [id number] Date of Birth/Sex: [birth year] (82 y.o. Female) Treating RN: Huel Coventry Primary Care Physician: Dorothey Baseman Other Clinician: Referring Physician: Dorothey Baseman Treating Physician/Extender: [name] in Treatment: 33 Wound  Status Wound Number: 9 Primary Diabetic Wound/Ulcer of the Lower Etiology: Extremity Wound Location: Right, Anterior Lower Leg Wound Status: Healed - Epithelialized Wounding Event: Gradually Appeared Date Acquired: 08/31/2015 Weeks Of Treatment: 4 Clustered Wound: No Photos Photo Uploaded By: Elliot Gurney, RN, BSN, Kim on 09/30/2015 16:40:20 Wound Measurements Length: (cm) Width: (cm) Depth: (cm) Area: (cm) Volume: (cm) 0 % Reduction in Area: 0 % Reduction in Volume: 0 0 0 Wound Description Classification: Grade 1 Periwound Skin Texture Texture Color No Abnormalities Noted: No No Abnormalities Noted: No Moisture No Abnormalities Noted: No Electronic Signature(s) Signed: 09/30/2015 12:06:19  PM By: Elliot Gurney, RN, BSN, Kim RN, BSN Olney, Avaeh ([id number]) Entered By: Elliot Gurney, RN, BSN, Kim on 09/30/2015 10:28:26 [name] ([id number]) -------------------------------------------------------------------------------- Vitals Details Patient Name: [name] Date of Service: 09/30/2015 10:00 AM Medical Record Number: [id number] Patient Account Number: [id number] Date of Birth/Sex: 1920-12-02 (82 y.o. Female) Treating RN: Leonard Downing Primary Care Physician: Dorothey Baseman Other Clinician: Referring Physician: Dorothey Baseman Treating Physician/Extender: [name] in Treatment: 33 Vital Signs Time Taken: 10:10 Temperature (F): 97.4 Height (in): 62 Pulse (bpm): 59 Weight (lbs): 155 Respiratory Rate (breaths/min): 17 Body Mass Index (BMI): 28.3 Blood Pressure (mmHg): 129/50 Reference Range: 80 - 120 mg / dl Pulse Oximetry (%): 95 Electronic Signature(s) Signed: 09/30/2015 4:12:15 PM By: Lucrezia Starch RN, [place] Entered By: Lucrezia Starch RN, Sendra on 09/30/2015 10:10:49

## 2015-10-01 NOTE — Progress Notes (Signed)
CENIYA, FOWERS ([id number]) Visit Report for 09/30/2015 Chief Complaint Document Details Patient Name: [name], [name] Date of Service: 09/30/2015 10:00 AM Medical Record Number: [id number] Patient Account Number: [id number] Date of Birth/Sex: [birth year] (82 y.o. Female) Treating RN: Leonard Downing Primary Care Physician: Dorothey Baseman Other Clinician: Referring Physician: Dorothey Baseman Treating Physician/Extender: Rudene Re in Treatment: 80 Information Obtained from: Patient Chief Complaint Patient presents to the wound care center for a consult due non healing wound. 82 year old patient with comes with a history of a ulcerated area to the right third toe and the right heel which she's had for about 2 months. Electronic Signature(s) Signed: 09/30/2015 10:41:56 AM By: Evlyn Kanner MD, FACS Entered By: Evlyn Kanner on 09/30/2015 10:41:56 [name] ([id number]) -------------------------------------------------------------------------------- HPI Details Patient Name: [name] Date of Service: 09/30/2015 10:00 AM Medical Record Number: [id number] Patient Account Number: [id number] Date of Birth/Sex: April 19, 1921 (82 y.o. Female) Treating RN: Leonard Downing Primary Care Physician: Dorothey Baseman Other Clinician: Referring Physician: Dorothey Baseman Treating Physician/Extender: Rudene Re in Treatment: 31 History of Present Illness Location: right medial heel and right third toe Quality: Patient reports experiencing a dull pain to affected area(s). Severity: Patient states wound are getting worse. Duration: Patient has had the wound for > 3 months prior to seeking treatment at the wound center Timing: Pain in wound is Intermittent (comes and goes Context: The wound appeared gradually over time Modifying Factors: Consults to this date include:vascular surgeon and several recent surgeries. Associated Signs and Symptoms: Patient reports having foul odor  and drainage from the left below-knee amputation site. HPI Description: A pleasant 82 year old patient who is known to have diabetes mellitus for several years has recently gone through a series of operations with the vascular surgeon Dr. Gilda Crease. In January and February she had several surgeries on the left lower extremity with an attempt to have limb salvage for gangrenous changes of her forefoot. 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 On 01/07/2015 she also had a right lower extremity distal runoff with a angioplasty of the right anterior tibial artery to maximize her blood flow to the foot. She recently had her staples removed at Dr. Marijean Heath office on 01/31/2015 and at that time a right lower extremity duplex was done which showed patent vessels and a patent stent with distal occluded posterior tibial artery. Though the duplex was noncritical the recommendations from the PA at the vascular surgery office was that of a angiogram to be done but the patient said she would rather try some bone care before. The patient has received doxycycline and Cipro in the recent past and she takes oral medications for her diabetes. Other than that I reviewed her list of all her medications. No recent hemoglobin A1c has been done and no recent x-rays of the right foot have been done. 02/18/2015 -- x-ray of the right foot was done on 02/11/2015 and it shows no evidence of acute osteomyelitis of the third toe or of the calcaneus. She had gone to the vascular surgery office and they had noted that there is dehiscence of the left part of the amputation site of the below-knee wound and they have asked [place] to kindly take over the care of this. There've been doing dressings for the right heel and right third toe. 02/25/2015 -- we have received notes from the vascular group who saw her last on 02/13/2015 and she was seen by the PA Ms.  Cleda Daub. She had recommended that the patient continue to follow with [place] for wound care including management of the left BKA stump, which has had dehiscence of the lateral part. They will consider repeating a arterial duplex or angiogram if the right lower extremity does not heal within a reasonable period of time. 03/18/2015 -- he saw the vascular surgeon Dr. Gilda Crease and he has set her up for an angioplasty sometime in the middle of July. she is doing well otherwise. [name], [name] ([id number]) 04/10/2015 -- the patient had a procedure done on 04/08/2015 and this was a right angioplasty of the dorsalis pedis, anterior tibial artery, first superficial femoral artery in its midportion. There was successful intervention with recanalization and in-line flow to the right foot. 04/22/2015 -- the patient was looking rather pale today and the daughter confirms that her hemoglobin was down to 6.9. she is being monitored by her PCP. Her Lasix dose was increased and her edema has gone down significantly. 04/29/2015 -- she had vascular studies done and the ABI on the right is 1.3 and the toe pressures were within normal limits. Her hemoglobin is still around 7 and she refuses to take any blood transfusions for religious reasons. Her potassium was normal. 06/10/2015 -- she has a new blister on her right lateral and posterior part of her leg just in the region where the Kerlix bandages were applied and this may be due to an abrasion. 06/24/2015 -- On her right lower extremity she has developed several more blisters which look like small pustules and the drain informed shallow ulcerations. 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 07/01/2015 -- she has an appointment with the PCP this Friday and her vascular surgeon on Monday. Other than that she is on doxycycline and has finished 1 week of treatment. The pustules she had all over have now resolved. 07/08/2015 -- she saw the vascular surgeon who did  studies in the office and found that there is poor circulation in the distal lower right leg and has set her up for an angiogram and possible stenting next Tuesday. 07/22/2015 -- on October 18 she was taken up for a successful revascularization of the anterior tibial vessel on the right side. a PTA and stent placement was done to the right anterior tibial artery. 08/19/2015 -- she has broken out with some linear ulceration in the region of her webspaces of her toes and this is something new. 08/26/2015 -- over the last 3 days there was a streak of redness going from her lower extremity towards her toes but she had no fever or discharge from the wounds. 09/02/2015 -- the redness and cellulitis has gone down but she continues to have a lot of edema of the right lower extremity. Electronic Signature(s) Signed: 09/30/2015 10:42:03 AM By: Evlyn Kanner MD, FACS Entered By: Evlyn Kanner on 09/30/2015 10:42:03 [name] ([id number]) -------------------------------------------------------------------------------- Physical Exam Details Patient Name: [name] Date of Service: 09/30/2015 10:00 AM Medical Record Number: [id number] Patient Account Number: [id number] Date of Birth/Sex: [birth year] (82 y.o. Female) Treating RN: Leonard Downing Primary Care Physician: Dorothey Baseman Other Clinician: Referring Physician: Dorothey Baseman Treating Physician/Extender: Rudene Re in Treatment: 33 Constitutional . Pulse regular. Respirations normal and unlabored. Afebrile. . Eyes Nonicteric. Reactive to light. Ears, Nose, Mouth, and Throat Lips, teeth, and gums WNL.Marland Kitchen Moist mucosa without lesions. Neck supple and nontender. No palpable supraclavicular or cervical adenopathy. Normal sized without goiter. Respiratory WNL. No retractions.. Cardiovascular Pedal Pulses WNL. No clubbing, cyanosis or edema. Lymphatic  No adneopathy. No adenopathy. No adenopathy. Musculoskeletal Adexa  without tenderness or enlargement.. Digits and nails w/o clubbing, cyanosis, infection, petechiae, ischemia, or inflammatory conditions.. Integumentary (Hair, Skin) No suspicious lesions. No crepitus or fluctuance. No peri-wound warmth or erythema. No masses.Marland Kitchen Psychiatric Judgement and insight Intact.. No evidence of depression, anxiety, or agitation.. Notes the right medial calcaneal in area has completely healed and is looking good and no debridement was done. The wounds on the lateral part of the right lower extremity have some slough and sharp debridement is not possible because of severe tenderness. We will recommend Santyl over these wounds. Electronic Signature(s) Signed: 09/30/2015 10:42:47 AM By: Evlyn Kanner MD, FACS Entered By: Evlyn Kanner on 09/30/2015 10:42:46 [name] ([id number]) -------------------------------------------------------------------------------- Physician Orders Details Patient Name: [name] Date of Service: 09/30/2015 10:00 AM Medical Record Number: [id number] Patient Account Number: [id number] Date of Birth/Sex: Sep 28, 1920 (82 y.o. Female) Treating RN: Huel Coventry Primary Care Physician: Dorothey Baseman Other Clinician: Referring Physician: Dorothey Baseman Treating Physician/Extender: Rudene Re in Treatment: 6 Verbal / Phone Orders: Yes Clinician: Huel Coventry Read Back and Verified: Yes Diagnosis Coding Wound Cleansing Wound #6 Right,Posterior Lower Leg o Cleanse wound with mild soap and water o May Shower, gently pat wound dry prior to applying new dressing. Anesthetic Wound #6 Right,Posterior Lower Leg o Topical Lidocaine 4% cream applied to wound bed prior to debridement Primary Wound Dressing Wound #6 Right,Posterior Lower Leg o Santyl Ointment Secondary Dressing Wound #6 Right,Posterior Lower Leg o Gauze and Kerlix/Conform Dressing Change Frequency Wound #6 Right,Posterior Lower Leg o Change dressing  every day. Follow-up Appointments Wound #6 Right,Posterior Lower Leg o Return Appointment in 1 week. Home Health Wound #6 Right,Posterior Lower Leg o Continue Home Health Visits - HHRN may use aquacel ag between toes if needed for drainage o Home Health Nurse may visit PRN to address patientos wound care needs. o FACE TO FACE ENCOUNTER: MEDICARE and MEDICAID PATIENTS: I certify that this patient is under my care and that I had a face-to-face encounter that meets the physician face-to-face encounter requirements with this patient on this date. The encounter with the patient was in whole or in part for the following MEDICAL CONDITION: (primary reason for Home Healthcare) MEDICAL NECESSITY: I certify, that based on my findings, NURSING services are a medically necessary home health service. HOME BOUND STATUS: I certify that my clinical findings Stettner, Kashonda ([id number]) support that this patient is homebound (i.e., Due to illness or injury, pt requires aid of supportive devices such as crutches, cane, wheelchairs, walkers, the use of special transportation or the assistance of another person to leave their place of residence. There is a normal inability to leave the home and doing so requires considerable and taxing effort. Other absences are for medical reasons / religious services and are infrequent or of short duration when for other reasons). o If current dressing causes regression in wound condition, may D/C ordered dressing product/s and apply Normal Saline Moist Dressing daily until next Wound Healing Center / Other MD appointment. Notify Wound Healing Center of regression in wound condition at [phone]. o Please direct any NON-WOUND related issues/requests for orders to patient's Primary Care Physician Electronic Signature(s) Signed: 09/30/2015 12:06:19 PM By: Elliot Gurney RN, BSN, Kim RN, BSN Signed: 09/30/2015 4:44:22 PM By: Evlyn Kanner MD, FACS Entered By: Elliot Gurney RN,  BSN, Kim on 09/30/2015 10:32:07 [name] ([id number]) -------------------------------------------------------------------------------- Problem List Details Patient Name: [name] Date of Service: 09/30/2015 10:00 AM Medical Record Number: [id number] Patient Account  Number: [id number] Date of Birth/Sex: 12-03-1920 (82 y.o. 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 with 02/11/2015 Yes fat layer exposed L97.812 Non-pressure chronic ulcer of other part of right lower leg 02/11/2015 Yes with fat layer exposed T81.31XA Disruption of external operation (surgical) wound, not 02/18/2015 Yes elsewhere classified, initial encounter Inactive Problems Resolved Problems Electronic Signature(s) Signed: 09/30/2015 10:41:48 AM By: Evlyn Kanner MD, FACS Entered By: Evlyn Kanner on 09/30/2015 10:41:48 [name] ([id number]) -------------------------------------------------------------------------------- Progress Note Details Patient Name: [name] Date of Service: 09/30/2015 10:00 AM Medical Record Number: [id number] Patient Account Number: [id number] Date of Birth/Sex: 05/01/1921 (82 y.o. Female) Treating RN: Leonard Downing Primary Care Physician: Dorothey Baseman Other Clinician: Referring Physician: Dorothey Baseman Treating Physician/Extender: Rudene Re in Treatment: 70 Subjective Chief Complaint Information obtained from Patient Patient  presents to the wound care center for a consult due non healing wound. 82 year old patient with comes with a history of a ulcerated area to the right third toe and the right heel which she's had for about 2 months. History of Present Illness (HPI) The following HPI elements were documented for the patient's wound: Location: right medial heel and right third toe Quality: Patient reports experiencing a dull pain to affected area(s). Severity: Patient states wound are getting worse. Duration: Patient has had the wound for > 3 months prior to seeking treatment at the wound center Timing: Pain in wound is Intermittent (comes and goes Context: The wound appeared gradually over time Modifying Factors: Consults to this date include:vascular surgeon and several recent surgeries. Associated Signs and Symptoms: Patient reports having foul odor and drainage from the left below-knee amputation site. A pleasant 82 year old patient who is known to have diabetes mellitus for several years has recently gone through a series of operations with the vascular surgeon Dr. Gilda Crease. In January and February she had several surgeries on the left lower extremity with an attempt to have limb salvage for gangrenous changes of her forefoot. 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 On 01/07/2015 she also had a right lower extremity distal runoff with a angioplasty of the right anterior tibial artery to maximize her blood flow to the foot. She recently had her staples removed at Dr. Marijean Heath office on 01/31/2015 and at that time a right lower extremity duplex was done which showed patent vessels and a patent stent with distal occluded posterior tibial artery. Though the duplex was noncritical the recommendations from the PA at the vascular surgery office was that of a angiogram to be done but the patient said she would rather try some bone care  before. The patient has received doxycycline and Cipro in the recent past and she takes oral medications for her diabetes. Other than that I reviewed her list of all her medications. No recent hemoglobin A1c has been done and no recent x-rays of the right foot have been done. 02/18/2015 -- x-ray of the right foot was done on 02/11/2015 and it shows no evidence of acute osteomyelitis of the third toe or  of the calcaneus. She had gone to the vascular surgery office and they had noted that there is dehiscence of the left part of the amputation site of the below-knee wound and they have asked us to kindly take over the care of this. There've been doing dressings for the right heel and right third toe. [name] NationsSILETZKY, [name] ([id number]) 02/25/2015 -- we have received notes from the vascular group who saw her last on 02/13/2015 and she was seen by the PA Ms. Cleda DaubKimberly Stegmayer. She had recommended that the patient continue to follow with us for wound care including management of the left BKA stump, which has had dehiscence of the lateral part. They will consider repeating a arterial duplex or angiogram if the right lower extremity does not heal within a reasonable period of time. 03/18/2015 -- he saw the vascular surgeon Dr. Gilda CreaseSchnier and he has set her up for an angioplasty sometime in the middle of July. she is doing well otherwise. 04/10/2015 -- the patient had a procedure done on 04/08/2015 and this was a right angioplasty of the dorsalis pedis, anterior tibial artery, first superficial femoral artery in its midportion. There was successful intervention with recanalization and in-line flow to the right foot. 04/22/2015 -- the patient was looking rather pale today and the daughter confirms that her hemoglobin was down to 6.9. she is being monitored by her PCP. Her Lasix dose was increased and her edema has gone down significantly. 04/29/2015 -- she had vascular studies done and the ABI on the right is 1.3  and the toe pressures were within normal limits. Her hemoglobin is still around 7 and she refuses to take any blood transfusions for religious reasons. Her potassium was normal. 06/10/2015 -- she has a new blister on her right lateral and posterior part of her leg just in the region where the Kerlix bandages were applied and this may be due to an abrasion. 06/24/2015 -- On her right lower extremity she has developed several more blisters which look like small pustules and the drain informed shallow ulcerations. 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 07/01/2015 -- she has an appointment with the PCP this Friday and her vascular surgeon on Monday. Other than that she is on doxycycline and has finished 1 week of treatment. The pustules she had all over have now resolved. 07/08/2015 -- she saw the vascular surgeon who did studies in the office and found that there is poor circulation in the distal lower right leg and has set her up for an angiogram and possible stenting next Tuesday. 07/22/2015 -- on October 18 she was taken up for a successful revascularization of the anterior tibial vessel on the right side. a PTA and stent placement was done to the right anterior tibial artery. 08/19/2015 -- she has broken out with some linear ulceration in the region of her webspaces of her toes and this is something new. 08/26/2015 -- over the last 3 days there was a streak of redness going from her lower extremity towards her toes but she had no fever or discharge from the wounds. 09/02/2015 -- the redness and cellulitis has gone down but she continues to have a lot of edema of the right lower extremity. Objective Constitutional [name], [name] ([id number]) Pulse regular. Respirations normal and unlabored. Afebrile. Vitals Time Taken: 10:10 AM, Height: 62 in, Weight: 155 lbs, BMI: 28.3, Temperature: 97.4 F, Pulse: 59 bpm, Respiratory Rate: 17 breaths/min, Blood Pressure: 129/50 mmHg, Pulse Oximetry: 95  %. Eyes Nonicteric. Reactive to light. Ears,  Nose, Mouth, and Throat Lips, teeth, and gums WNL.Marland Kitchen Moist mucosa without lesions. Neck supple and nontender. No palpable supraclavicular or cervical adenopathy. Normal sized without goiter. Respiratory WNL. No retractions.. Cardiovascular Pedal Pulses WNL. No clubbing, cyanosis or edema. Lymphatic No adneopathy. No adenopathy. No adenopathy. Musculoskeletal Adexa without tenderness or enlargement.. Digits and nails w/o clubbing, cyanosis, infection, petechiae, ischemia, or inflammatory conditions.Marland Kitchen Psychiatric Judgement and insight Intact.. No evidence of depression, anxiety, or agitation.. General Notes: the right medial calcaneal in area has completely healed and is looking good and no debridement was done. The wounds on the lateral part of the right lower extremity have some slough and sharp debridement is not possible because of severe tenderness. We will recommend Santyl over these wounds. Integumentary (Hair, Skin) No suspicious lesions. No crepitus or fluctuance. No peri-wound warmth or erythema. No masses.. Wound #1 status is Healed - Epithelialized. Original cause of wound was Gradually Appeared. The wound is located on the Right Calcaneous. The wound measures 0cm length x 0cm width x 0cm depth; 0cm^2 area and 0cm^3 volume. Wound #10 status is Healed - Epithelialized. Original cause of wound was Gradually Appeared. The wound is located on the Right,Distal,Anterior Lower Leg. The wound measures 0cm length x 0cm width x 0cm depth; 0cm^2 area and 0cm^3 volume. Wound #6 status is Open. Original cause of wound was Blister. The wound is located on the Right,Posterior Lower Leg. The wound measures 6cm length x 1.8cm width x 0.2cm depth; 8.482cm^2 area and 1.696cm^3 volume. The wound is limited to skin breakdown. There is a medium amount of Lollar, Briany ([id number]) serosanguineous drainage noted. The wound margin is flat and intact.  There is medium (34-66%) pink granulation within the wound bed. There is a medium (34-66%) amount of necrotic tissue within the wound bed including Adherent Slough. The periwound skin appearance exhibited: Localized Edema, Moist. The periwound skin appearance did not exhibit: Callus, Crepitus, Excoriation, Fluctuance, Friable, Induration, Rash, Scarring, Dry/Scaly, Maceration, Atrophie Blanche, Cyanosis, Ecchymosis, Hemosiderin Staining, Mottled, Pallor, Rubor, Erythema. The periwound has tenderness on palpation. Wound #9 status is Healed - Epithelialized. Original cause of wound was Gradually Appeared. The wound is located on the Right,Anterior Lower Leg. The wound measures 0cm length x 0cm width x 0cm depth; 0cm^2 area and 0cm^3 volume. 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 Anesthetic: Wound #6 Right,Posterior Lower Leg: Topical Lidocaine 4% cream applied to wound bed prior to debridement Primary Wound Dressing: Wound #6 Right,Posterior Lower Leg: Santyl Ointment Secondary Dressing: Wound #6 Right,Posterior Lower Leg: Gauze and Kerlix/Conform Dressing Change Frequency: Wound #6 Right,Posterior Lower Leg: Change dressing every day. Follow-up Appointments: [name], [name] ([id number]) Wound #6 Right,Posterior Lower Leg: Return Appointment in 1  week. Home Health: Wound #6 Right,Posterior Lower Leg: Continue Home Health Visits - HHRN may use aquacel ag between toes if needed for drainage Home Health Nurse may visit PRN to address patient s wound care needs. FACE  TO FACE ENCOUNTER: MEDICARE and MEDICAID PATIENTS: I certify that this patient is under my care and that I had a face-to-face encounter that meets the physician face-to-face encounter requirements with this patient on this date. The encounter with the patient was in whole or in part for the following MEDICAL CONDITION: (primary reason for Home Healthcare) MEDICAL NECESSITY: I certify, that based on my findings, NURSING services are a medically necessary home health service. HOME BOUND STATUS: I certify that my clinical findings support that this patient is homebound (i.e., Due to illness or injury, pt requires aid of supportive devices such as crutches, cane, wheelchairs, walkers, the use of special transportation or the assistance of another person to leave their place of residence. There is a normal inability to leave the home and doing so requires considerable and taxing effort. Other absences are for medical reasons / religious services and are infrequent or of short duration when for other reasons). If current dressing causes regression in wound condition, may D/C ordered dressing product/s and apply Normal Saline Moist Dressing daily until next [facility]aling Center / Other MD appointment. Notify W[facility]aling Center of regression in wound condition at [phone]. Please direct any NON-WOUND related issues/requests for orders to patient's Primary Care Physician I have recommended Santyl over the open ulceration on the right lateral lower extremity. The other parts are dry and will need foam protection. In between her toes there is good resolution and minimal need for silver alginate as required. Electronic Signature(s) Signed: 09/30/2015 10:43:30 AM By: Evlyn Kanner MD,  FACS Entered By: Evlyn Kanner on 09/30/2015 10:43:30 [name] ([id number]) -------------------------------------------------------------------------------- SuperBill Details Patient Name: [name] Date of Service: 09/30/2015 Medical Record Number: [id number] Patient Account Number: [id number] Date of Birth/Sex: July 16, 1921 (82 y.o. 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 L97.812 Non-pressure chronic ulcer of other part of right lower leg with fat layer exposed Disruption of external operation (surgical) wound, not elsewhere classified, initial T81.31XA encounter Facility Procedures CPT4 Code: 86578469 Description: 62952 - WOUND CARE VISIT-LEV 2 EST PT Modifier: Quantity: 1 Physician Procedures CPT4: Description Modifier Quantity Code [id number] - WC PHYS LEVEL 3 - EST PT 1 ICD-10 Description Diagnosis E11.621 Type 2 diabetes mellitus with foot ulcer I70.235 Atherosclerosis of native arteries of right leg with ulceration of other part of  foot 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

## 2015-10-14 ENCOUNTER — Encounter: Payer: Medicare Other | Admitting: Surgery

## 2015-10-14 DIAGNOSIS — E11621 Type 2 diabetes mellitus with foot ulcer: Secondary | ICD-10-CM | POA: Diagnosis not present

## 2015-10-14 NOTE — Progress Notes (Addendum)
HALLELUJAH, WYSONG ([id number]) Visit Report for 10/14/2015 Chief Complaint Document Details Patient Name: [name], [name] Date of Service: 10/14/2015 10:00 AM Medical Record Number: [id number] Patient Account Number: [id number] Date of Birth/Sex: [birth year] (82 y.o. Female) Treating RN: Clover Mealy, RN, BSN, [place] Sink Primary Care Physician: Dorothey Baseman Other Clinician: Referring Physician: Dorothey Baseman Treating Physician/Extender: Rudene Re in Treatment: 35 Information Obtained from: Patient Chief Complaint Patient presents to the wound care center for a consult due non healing wound. 82 year old patient with comes with a history of a ulcerated area to the right third toe and the right heel which she's had for about 2 months. Electronic Signature(s) Signed: 10/14/2015 10:24:34 AM By: Evlyn Kanner MD, FACS Entered By: Evlyn Kanner on 10/14/2015 10:24:34 [name] ([id number]) -------------------------------------------------------------------------------- Debridement Details Patient Name: [name] Date of Service: 10/14/2015 10:00 AM Medical Record Number: [id number] Patient Account Number: [id number] Date of Birth/Sex: [birth year] (82 y.o. Female) Treating RN: Afful, RN, BSN, [place] Sink Primary Care Physician: Terance Hart, DAVID Other Clinician: Referring Physician: Terance Hart, DAVID Treating Physician/Extender: Rudene Re in Treatment: 35 Debridement Performed for Wound #6 Right,Posterior Lower Leg Assessment: Performed By: Physician Evlyn Kanner, MD Debridement: Debridement Pre-procedure Yes Verification/Time Out Taken: Start Time: 10:15 Pain Control: Lidocaine 5% topical ointment Level: Skin/Subcutaneous Tissue Total Area Debrided (L x 3 (cm) x 5 (cm) = 15 (cm) W): Tissue and other Viable, Non-Viable, Fibrin/Slough, Subcutaneous material debrided: Instrument: Other : saline gauze Bleeding: Minimum Hemostasis Achieved: Pressure End Time:  10:18 Procedural Pain: 4 Post Procedural Pain: 0 Response to Treatment: Procedure was tolerated well Post Debridement Measurements of Total Wound Length: (cm) 6 Width: (cm) 7 Depth: (cm) 0.2 Volume: (cm) 6.597 Post Procedure Diagnosis Same as Pre-procedure Electronic Signature(s) Signed: 10/14/2015 10:23:43 AM By: Evlyn Kanner MD, FACS Signed: 10/14/2015 5:00:07 PM By: Elpidio Eric BSN, RN Entered By: Evlyn Kanner on 10/14/2015 10:23:43 [name] ([id number]) -------------------------------------------------------------------------------- HPI Details Patient Name: [name] Date of Service: 10/14/2015 10:00 AM Medical Record Number: [id number] Patient Account Number: [id number] Date of Birth/Sex: 07-12-1921 (82 y.o. Female) Treating RN: Afful, RN, BSN, [place] Sink Primary Care Physician: Dorothey Baseman Other Clinician: Referring Physician: Terance Hart, DAVID Treating Physician/Extender: Rudene Re in Treatment: 35 History of Present Illness Location: right medial heel and right third toe Quality: Patient reports experiencing a dull pain to affected area(s). Severity: Patient states wound are getting worse. Duration: Patient has had the wound for > 3 months prior to seeking treatment at the wound center Timing: Pain in wound is Intermittent (comes and goes Context: The wound appeared gradually over time Modifying Factors: Consults to this date include:vascular surgeon and several recent surgeries. Associated Signs and Symptoms: Patient reports having foul odor and drainage from the left below-knee amputation site. HPI Description: A pleasant 82 year old patient who is known to have diabetes mellitus for several years has recently gone through a series of operations with the vascular surgeon Dr. Gilda Crease. In January and February she had several surgeries on the left lower extremity with an attempt to have limb salvage for gangrenous changes of her forefoot. Besides the  surgery she also had a transmetatarsal amputation but ultimately she ended up with a left BKA on 01/03/2015. On 01/07/2015 she also had a right lower extremity distal runoff with a angioplasty of the right anterior tibial artery to maximize her blood flow to the foot. She recently had her staples removed at Dr. Marijean Heath office on 01/31/2015 and at that time a right lower extremity duplex was done which showed patent vessels and a  patent stent with distal occluded posterior tibial artery. Though the duplex was noncritical the recommendations from the PA at the vascular surgery office was that of a angiogram to be done but the patient said she would rather try some bone care before. The patient has received doxycycline and Cipro in the recent past and she takes oral medications for her diabetes. Other than that I reviewed her list of all her medications. No recent hemoglobin A1c has been done and no recent x-rays of the right foot have been done. 02/18/2015 -- x-ray of the right foot was done on 02/11/2015 and it shows no evidence of acute osteomyelitis of the third toe or of the calcaneus. She had gone to the vascular surgery office and they had noted that there is dehiscence of the left part of the amputation site of the below-knee wound and they have asked [place] to kindly take over the care of this. There've been doing dressings for the right heel and right third toe. 02/25/2015 -- we have received notes from the vascular group who saw her last on 02/13/2015 and she was seen by the PA Ms. Cleda Daub. She had recommended that the patient continue to follow with [place] for wound care including management of the left BKA stump, which has had dehiscence of the lateral part. They will consider repeating a arterial duplex or angiogram if the right lower extremity does not heal within a reasonable period of time. 03/18/2015 -- he saw the vascular surgeon Dr. Gilda Crease and he has set her up for an  angioplasty sometime in the middle of July. she is doing well otherwise. MADDEN, PIAZZA ([id number]) 04/10/2015 -- the patient had a procedure done on 04/08/2015 and this was a right angioplasty of the dorsalis pedis, anterior tibial artery, first superficial femoral artery in its midportion. There was successful intervention with recanalization and in-line flow to the right foot. 04/22/2015 -- the patient was looking rather pale today and the daughter confirms that her hemoglobin was down to 6.9. she is being monitored by her PCP. Her Lasix dose was increased and her edema has gone down significantly. 04/29/2015 -- she had vascular studies done and the ABI on the right is 1.3 and the toe pressures were within normal limits. Her hemoglobin is still around 7 and she refuses to take any blood transfusions for religious reasons. Her potassium was normal. 06/10/2015 -- she has a new blister on her right lateral and posterior part of her leg just in the region where the Kerlix bandages were applied and this may be due to an abrasion. 06/24/2015 -- On her right lower extremity she has developed several more blisters which look like small pustules and the drain informed shallow ulcerations. 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 07/01/2015 -- she has an appointment with the PCP this Friday and her vascular surgeon on Monday. Other than that she is on doxycycline and has finished 1 week of treatment. The pustules she had all over have now resolved. 07/08/2015 -- she saw the vascular surgeon who did studies in the office and found that there is poor circulation in the distal lower right leg and has set her up for an angiogram and possible stenting next Tuesday. 07/22/2015 -- on October 18 she was taken up for a successful revascularization of the anterior tibial vessel on the right side. a PTA and stent placement was done to the right anterior tibial artery. 08/19/2015 -- she has broken out with some  linear ulceration in the  region of her webspaces of her toes and this is something new. 08/26/2015 -- over the last 3 days there was a streak of redness going from her lower extremity towards her toes but she had no fever or discharge from the wounds. 09/02/2015 -- the redness and cellulitis has gone down but she continues to have a lot of edema of the right lower extremity. Electronic Signature(s) Signed: 10/14/2015 10:24:44 AM By: Evlyn Kanner MD, FACS Entered By: Evlyn Kanner on 10/14/2015 10:24:44 [name] ([id number]) -------------------------------------------------------------------------------- Physical Exam Details Patient Name: [name] Date of Service: 10/14/2015 10:00 AM Medical Record Number: [id number] Patient Account Number: [id number] Date of Birth/Sex: 06-15-1921 (82 y.o. Female) Treating RN: Clover Mealy, RN, BSN, [employer] Sink Primary Care Physician: Dorothey Baseman Other Clinician: Referring Physician: Terance Hart, DAVID Treating Physician/Extender: Rudene Re in Treatment: 35 Constitutional . Pulse regular. Respirations normal and unlabored. Afebrile. . Eyes Nonicteric. Reactive to light. Ears, Nose, Mouth, and Throat Lips, teeth, and gums WNL.Marland Kitchen Moist mucosa without lesions. Neck supple and nontender. No palpable supraclavicular or cervical adenopathy. Normal sized without goiter. Respiratory WNL. No retractions.. Cardiovascular Pedal Pulses WNL. No clubbing, cyanosis or edema. Lymphatic No adneopathy. No adenopathy. No adenopathy. Musculoskeletal Adexa without tenderness or enlargement.. Digits and nails w/o clubbing, cyanosis, infection, petechiae, ischemia, or inflammatory conditions.. Integumentary (Hair, Skin) No suspicious lesions. No crepitus or fluctuance. No peri-wound warmth or erythema. No masses.Marland Kitchen Psychiatric Judgement and insight Intact.. No evidence of depression, anxiety, or agitation.. Notes the right posterior lateral and medial part  has a few areas of ulceration and this has subcutaneous debris which was sharply debrided with moist saline gauze down to healthy granulation tissue to the best of my ability has it is quite tender. Electronic Signature(s) Signed: 10/14/2015 10:25:36 AM By: Evlyn Kanner MD, FACS Entered By: Evlyn Kanner on 10/14/2015 10:25:35 [name] ([id number]) -------------------------------------------------------------------------------- Physician Orders Details Patient Name: [name] Date of Service: 10/14/2015 10:00 AM Medical Record Number: [id number] Patient Account Number: [id number] Date of Birth/Sex: 12/28/1920 (82 y.o. Female) Treating RN: Afful, RN, BSN, [place] Sink Primary Care Physician: Dorothey Baseman Other Clinician: Referring Physician: Terance Hart DAVID Treating Physician/Extender: Rudene Re in Treatment: 82 Verbal / Phone Orders: Yes Clinician: Afful, RN, BSN, Rita Read Back and Verified: Yes Diagnosis Coding Wound Cleansing Wound #6 Right,Posterior Lower Leg o Cleanse wound with mild soap and water o May Shower, gently pat wound dry prior to applying new dressing. Anesthetic Wound #6 Right,Posterior Lower Leg o Topical Lidocaine 4% cream applied to wound bed prior to debridement Primary Wound Dressing Wound #6 Right,Posterior Lower Leg o Santyl Ointment Secondary Dressing Wound #6 Right,Posterior Lower Leg o Gauze and Kerlix/Conform Dressing Change Frequency Wound #6 Right,Posterior Lower Leg o Change dressing every day. Follow-up Appointments Wound #6 Right,Posterior Lower Leg o Return Appointment in 2 weeks. Home Health Wound #6 Right,Posterior Lower Leg o Continue Home Health Visits - HHRN may use aquacel ag between toes if needed for drainage o Home Health Nurse may visit PRN to address patientos wound care needs. o FACE TO FACE ENCOUNTER: MEDICARE and MEDICAID PATIENTS: I certify that this patient is under my care and that I  had a face-to-face encounter that meets the physician face-to-face encounter requirements with this patient on this date. The encounter with the patient was in whole or in part for the following MEDICAL CONDITION: (primary reason for Home Healthcare) MEDICAL NECESSITY: I certify, that based on my findings, NURSING services are a medically necessary home health service. HOME BOUND STATUS: I certify  that my clinical findings PICA, [place] ([id number]) support that this patient is homebound (i.e., Due to illness or injury, pt requires aid of supportive devices such as crutches, cane, wheelchairs, walkers, the use of special transportation or the assistance of another person to leave their place of residence. There is a normal inability to leave the home and doing so requires considerable and taxing effort. Other absences are for medical reasons / religious services and are infrequent or of short duration when for other reasons). o If current dressing causes regression in wound condition, may D/C ordered dressing product/s and apply Normal Saline Moist Dressing daily until next Wound Healing Center / Other MD appointment. Notify Wound Healing Center of regression in wound condition at [phone]. o Please direct any NON-WOUND related issues/requests for orders to patient's Primary Care Physician Electronic Signature(s) Signed: 10/14/2015 3:35:32 PM By: Evlyn Kanner MD, FACS Signed: 10/14/2015 5:00:07 PM By: Elpidio Eric BSN, RN Entered By: Elpidio Eric on 10/14/2015 10:35:31 [name] ([id number]) -------------------------------------------------------------------------------- Problem List Details Patient Name: [name] Date of Service: 10/14/2015 10:00 AM Medical Record Number: [id number] Patient Account Number: [id number] Date of Birth/Sex: January 16, 1921 (82 y.o. 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 02/11/2015 Yes fat layer exposed L97.812 Non-pressure chronic ulcer of other part of right lower leg 02/11/2015 Yes with fat layer exposed T81.31XA Disruption of external operation (surgical) wound, not 02/18/2015 Yes elsewhere classified, initial encounter Inactive Problems Resolved Problems Electronic Signature(s) Signed: 10/14/2015 10:22:25 AM By: Evlyn Kanner MD, FACS Entered By: Evlyn Kanner on 10/14/2015 10:22:25 [name] ([id number]) -------------------------------------------------------------------------------- Progress Note Details Patient Name: [name] Date of Service: 10/14/2015 10:00 AM Medical Record Number: [id number] Patient Account Number: [id number] Date of Birth/Sex: Sep 01, 1921 (82 y.o. Female) Treating RN: Clover Mealy, RN, BSN, [place] Sink Primary Care Physician: Dorothey Baseman Other Clinician: Referring Physician: Dorothey Baseman Treating Physician/Extender: Rudene Re in Treatment: 35 Subjective Chief Complaint Information obtained from Patient Patient presents to the wound care center for a consult due non healing wound. 82 year old patient with comes with a history of a ulcerated area to the right third toe and the right heel which she's had for about 2 months. History of Present Illness (HPI) The following HPI elements were documented for the patient's wound: Location: right medial heel and right third toe Quality: Patient reports experiencing a dull pain to affected  area(s). Severity: Patient states wound are getting worse. Duration: Patient has had the wound for > 3 months prior to seeking treatment at the wound center Timing: Pain in wound is Intermittent (comes and goes Context: The wound appeared gradually over time Modifying Factors: Consults to this date include:vascular surgeon and several recent surgeries. Associated Signs and Symptoms: Patient reports having foul odor and drainage from the left below-knee amputation site. A pleasant 82 year old patient who is known to have diabetes mellitus for several years has recently gone through a series of operations with the vascular surgeon Dr. Gilda Crease. In January and February she had several surgeries on the left lower extremity with an attempt to have limb salvage for gangrenous changes of  her forefoot. 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 On 01/07/2015 she also had a right lower extremity distal runoff with a angioplasty of the right anterior tibial artery to maximize her blood flow to the foot. She recently had her staples removed at Dr. Marijean Heath office on 01/31/2015 and at that time a right lower extremity duplex was done which showed patent vessels and a patent stent with distal occluded posterior tibial artery. Though the duplex was noncritical the recommendations from the PA at the vascular surgery office was that of a angiogram to be done but the patient said she would rather try some bone care before. The patient has received doxycycline and Cipro in the recent past and she takes oral medications for her diabetes. Other than that I reviewed her list of all her medications. No recent hemoglobin A1c has been done and no recent x-rays of the right foot have been done. 02/18/2015 -- x-ray of the right foot was done on 02/11/2015 and it shows no evidence of acute osteomyelitis of the third toe or of the  calcaneus. She had gone to the vascular surgery office and they had noted that there is dehiscence of the left part of the amputation site of the below-knee wound and they have asked [place] to kindly take over the care of this. There've been doing dressings for the right heel and right third toe. PINKEY, MCJUNKIN ([id number]) 02/25/2015 -- we have received notes from the vascular group who saw her last on 02/13/2015 and she was seen by the PA Ms. Cleda Daub. She had recommended that the patient continue to follow with [place] for wound care including management of the left BKA stump, which has had dehiscence of the lateral part. They will consider repeating a arterial duplex or angiogram if the right lower extremity does not heal within a reasonable period of time. 03/18/2015 -- he saw the vascular surgeon Dr. Gilda Crease and he has set her up for an angioplasty sometime in the middle of July. she is doing well otherwise. 04/10/2015 -- the patient had a procedure done on 04/08/2015 and this was a right angioplasty of the dorsalis pedis, anterior tibial artery, first superficial femoral artery in its midportion. There was successful intervention with recanalization and in-line flow to the right foot. 04/22/2015 -- the patient was looking rather pale today and the daughter confirms that her hemoglobin was down to 6.9. she is being monitored by her PCP. Her Lasix dose was increased and her edema has gone down significantly. 04/29/2015 -- she had vascular studies done and the ABI on the right is 1.3 and the toe pressures were within normal limits. Her hemoglobin is still around 7 and she refuses to take any blood transfusions for religious reasons. Her potassium was normal. 06/10/2015 -- she has a new blister on her right lateral and posterior part of her leg just in the region where the Kerlix bandages were applied and this may be due to an abrasion. 06/24/2015 -- On her right lower extremity she has  developed several more blisters which look like small pustules and the drain informed shallow ulcerations. 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 07/01/2015 -- she has an appointment with the PCP this Friday and her vascular surgeon on Monday. Other than that she is on doxycycline and has finished 1 week of treatment. The pustules she had all over have now resolved. 07/08/2015 -- she saw the vascular surgeon  who did studies in the office and found that there is poor circulation in the distal lower right leg and has set her up for an angiogram and possible stenting next Tuesday. 07/22/2015 -- on October 18 she was taken up for a successful revascularization of the anterior tibial vessel on the right side. a PTA and stent placement was done to the right anterior tibial artery. 08/19/2015 -- she has broken out with some linear ulceration in the region of her webspaces of her toes and this is something new. 08/26/2015 -- over the last 3 days there was a streak of redness going from her lower extremity towards her toes but she had no fever or discharge from the wounds. 09/02/2015 -- the redness and cellulitis has gone down but she continues to have a lot of edema of the right lower extremity. Objective Constitutional Hinzman, Skyy ([id number]) Pulse regular. Respirations normal and unlabored. Afebrile. Vitals Time Taken: 10:00 AM, Height: 62 in, Weight: 155 lbs, BMI: 28.3, Temperature: 97.7 F, Pulse: 59 bpm, Respiratory Rate: 18 breaths/min, Blood Pressure: 116/52 mmHg. Eyes Nonicteric. Reactive to light. Ears, Nose, Mouth, and Throat Lips, teeth, and gums WNL.Marland Kitchen Moist mucosa without lesions. Neck supple and nontender. No palpable supraclavicular or cervical adenopathy. Normal sized without goiter. Respiratory WNL. No retractions.. Cardiovascular Pedal Pulses WNL. No clubbing, cyanosis or edema. Lymphatic No adneopathy. No adenopathy. No adenopathy. Musculoskeletal Adexa without  tenderness or enlargement.. Digits and nails w/o clubbing, cyanosis, infection, petechiae, ischemia, or inflammatory conditions.Marland Kitchen Psychiatric Judgement and insight Intact.. No evidence of depression, anxiety, or agitation.. General Notes: the right posterior lateral and medial part has a few areas of ulceration and this has subcutaneous debris which was sharply debrided with moist saline gauze down to healthy granulation tissue to the best of my ability has it is quite tender. Integumentary (Hair, Skin) No suspicious lesions. No crepitus or fluctuance. No peri-wound warmth or erythema. No masses.. Wound #6 status is Open. Original cause of wound was Blister. The wound is located on the Right,Posterior Lower Leg. The wound measures 6cm length x 7cm width x 0.2cm depth; 32.987cm^2 area and 6.597cm^3 volume. The wound is limited to skin breakdown. There is no tunneling or undermining noted. There is a medium amount of serosanguineous drainage noted. The wound margin is flat and intact. There is medium (34-66%) pink granulation within the wound bed. There is a medium (34-66%) amount of necrotic tissue within the wound bed including Adherent Slough. The periwound skin appearance exhibited: Localized Edema, Moist. The periwound skin appearance did not exhibit: Callus, Crepitus, Excoriation, Fluctuance, Friable, Induration, Rash, Scarring, Dry/Scaly, Maceration, Atrophie Blanche, Cyanosis, Ecchymosis, Hemosiderin Staining, Mottled, Pallor, Rubor, Erythema. Periwound temperature was noted as No Abnormality. The periwound has tenderness on palpation. BIHL, Tasharra ([id number]) 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 L97.812 -  Non-pressure chronic ulcer of other part of right lower leg with fat layer exposed T81.31XA - Disruption of external operation (surgical) wound, not elsewhere classified, initial encounter Procedures Wound #6 Wound #6 is an Arterial Insufficiency Ulcer located on the Right,Posterior Lower Leg . There was a Skin/Subcutaneous Tissue Debridement ([id number]) debridement with total area of 15 sq cm performed by Evlyn Kanner, MD. with the following instrument(s): saline gauze to remove Viable  and Non-Viable tissue/material including Fibrin/Slough and Subcutaneous after achieving pain control using Lidocaine 5% topical ointment. A time out was conducted prior to the start of the procedure. A Minimum amount of bleeding was controlled with Pressure. The procedure was tolerated well with a pain level of 4 throughout and a pain level of 0 following the procedure. Post Debridement Measurements: 6cm length x 7cm width x 0.2cm depth; 6.597cm^3 volume. Post procedure Diagnosis Wound #6: Same as Pre-Procedure Plan Wound Cleansing: Wound #6 Right,Posterior Lower Leg: Cleanse wound with mild soap and water May Shower, gently pat wound dry prior to applying new dressing. Anesthetic: Wound #6 Right,Posterior Lower Leg: Topical Lidocaine 4% cream applied to wound bed prior to debridement Primary Wound Dressing: Wound #6 Right,Posterior Lower Leg: Santyl Ointment Bradfield, Harriett ([id number]) Secondary Dressing: Wound #6 Right,Posterior Lower Leg: Gauze and Kerlix/Conform Dressing Change Frequency: Wound #6 Right,Posterior Lower Leg: Change dressing every day. Follow-up Appointments: Wound #6 Right,Posterior Lower Leg: Return Appointment in 2 weeks. Home Health: Wound #6 Right,Posterior Lower Leg: Continue Home Health Visits - HHRN may use aquacel ag between toes if needed for drainage Home Health Nurse may visit PRN to address patient s wound care needs. FACE TO FACE ENCOUNTER: MEDICARE and  MEDICAID PATIENTS: I certify that this patient is under my care and that I had a face-to-face encounter that meets the physician face-to-face encounter requirements with this patient on this date. The encounter with the patient was in whole or in part for the following MEDICAL CONDITION: (primary reason for Home Healthcare) MEDICAL NECESSITY: I certify, that based on my findings, NURSING services are a medically necessary home health service. HOME BOUND STATUS: I certify that my clinical findings support that this patient is homebound (i.e., Due to illness or injury, pt requires aid of supportive devices such as crutches, cane, wheelchairs, walkers, the use of special transportation or the assistance of another person to leave their place of residence. There is a normal inability to leave the home and doing so requires considerable and taxing effort. Other absences are for medical reasons / religious services and are infrequent or of short duration when for other reasons). If current dressing causes regression in wound condition, may D/C ordered dressing product/s and apply Normal Saline Moist Dressing daily until next Wound Healing Center / Other MD appointment. Notify Wound Healing Center of regression in wound condition at [phone]. Please direct any NON-WOUND related issues/requests for orders to patient's Primary Care Physician I have recommended Santyl over the open ulceration on the right lateral lower extremity. The other parts are dry and will need foam protection. In between her toes there is good resolution and minimal need for silver alginate as required. Electronic Signature(s) Signed: 10/14/2015 3:37:47 PM By: Evlyn Kanner MD, FACS Previous Signature: 10/14/2015 10:26:21 AM Version By: Evlyn Kanner MD, FACS Entered By: Evlyn Kanner on 10/14/2015 15:37:46 [name] ([id number]) -------------------------------------------------------------------------------- SuperBill  Details Patient Name: [name] Date of Service: 10/14/2015 Medical Record Number: [id number] Patient Account Number: [id number] Date of Birth/Sex: 1921/04/04 (82 y.o. 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 L97.812 Non-pressure chronic ulcer  of other part of right lower leg with fat layer exposed Disruption of external operation (surgical) wound, not elsewhere classified, initial T81.31XA encounter Facility Procedures CPT4: Description Modifier Quantity Code 40981191 11042 - DEB SUBQ TISSUE 20 SQ CM/< 1 ICD-10 Description Diagnosis E11.621 Type 2 diabetes mellitus with foot ulcer L97.812 Non-pressure chronic ulcer of other part of right lower leg with fat layer  exposed Physician Procedures CPT4: Description Modifier Quantity Code 4782956 11042 - WC PHYS SUBQ TISS 20 SQ CM 1 ICD-10 Description Diagnosis E11.621 Type 2 diabetes mellitus with foot ulcer L97.812 Non-pressure chronic ulcer of other part of right lower leg with fat layer exposed Electronic Signature(s) Signed: 10/14/2015 10:26:33 AM By: Evlyn Kanner MD, FACS Entered By: Evlyn Kanner on 10/14/2015 10:26:33

## 2015-10-15 NOTE — Progress Notes (Signed)
[name], [name] ([id number]) Visit Report for 10/14/2015 Arrival Information Details Patient Name: [name], [name] Date of Service: 10/14/2015 10:00 AM Medical Record Number: [id number] Patient Account Number: [id number] Date of Birth/Sex: 01/10/1921 (82 y.o. Female) Treating RN: Afful, RN, BSN, [place] Sink Primary Care Physician: Dorothey Baseman Other Clinician: Referring Physician: Terance Hart, DAVID Treating Physician/Extender: Rudene Re in Treatment: 35 Visit Information History Since Last Visit Added or deleted any medications: No Patient Arrived: Wheel Chair Any new allergies or adverse reactions: No Arrival Time: 09:55 Had a fall or experienced change in No Accompanied By: dtr in law activities of daily living that may affect Transfer Assistance: EasyPivot Patient risk of falls: Lift Signs or symptoms of abuse/neglect since last No Patient Identification Verified: Yes visito Secondary Verification Process Yes Hospitalized since last visit: No Completed: Has Dressing in Place as Prescribed: Yes Patient Has Alerts: Yes Pain Present Now: No Electronic Signature(s) Signed: 10/14/2015 5:00:07 PM By: Elpidio Eric BSN, RN Entered By: Elpidio Eric on 10/14/2015 09:59:30 [name] ([id number]) -------------------------------------------------------------------------------- Encounter Discharge Information Details Patient Name: [name] Date of Service: 10/14/2015 10:00 AM Medical Record Number: [id number] Patient Account Number: [id number] Date of Birth/Sex: 1921/04/23 (82 y.o. Female) Treating RN: Afful, RN, BSN, [place] Sink Primary Care Physician: Dorothey Baseman Other Clinician: Referring Physician: Terance Hart DAVID Treating Physician/Extender: Rudene Re in Treatment: 86 Encounter Discharge Information Items Discharge Pain Level: 0 Discharge Condition: Stable Ambulatory Status: Wheelchair Discharge Destination: Home Transportation: Private Auto Accompanied  By: Laurice Record Schedule Follow-up Appointment: No Medication Reconciliation completed and provided to Patient/Care No [name]: Provided on Clinical Summary of Care: 10/14/2015 Form Type Recipient Paper Patient HS Electronic Signature(s) Signed: 10/14/2015 5:00:07 PM By: Elpidio Eric BSN, RN Previous Signature: 10/14/2015 10:34:31 AM Version By: Gwenlyn Perking Entered By: Elpidio Eric on 10/14/2015 10:34:59 [name] ([id number]) -------------------------------------------------------------------------------- Lower Extremity Assessment Details Patient Name: [name] Date of Service: 10/14/2015 10:00 AM Medical Record Number: [id number] Patient Account Number: [id number] Date of Birth/Sex: Sep 24, 1921 (82 y.o. Female) Treating RN: Afful, RN, BSN, [place] Sink Primary Care Physician: Dorothey Baseman Other Clinician: Referring Physician: Terance Hart DAVID Treating Physician/Extender: Rudene Re in Treatment: 35 Edema Assessment Assessed: [Left: No] [Right: No] Edema: [Left: Ye] [Right: s] Vascular Assessment Pulses: Posterior Tibial Dorsalis Pedis Palpable: [Right:No] Doppler: [Right:Multiphasic] Extremity colors, hair growth, and conditions: Extremity Color: [Right:Mottled] Hair Growth on Extremity: [Right:No] Temperature of Extremity: [Right:Warm] Capillary Refill: [Right:< 3 seconds] Toe Nail Assessment Left: Right: Thick: Yes Discolored: Yes Deformed: No Improper Length and Hygiene: No Electronic Signature(s) Signed: 10/14/2015 5:00:07 PM By: Elpidio Eric BSN, RN Entered By: Elpidio Eric on 10/14/2015 10:01:24 [name] ([id number]) -------------------------------------------------------------------------------- Multi Wound Chart Details Patient Name: [name] Date of Service: 10/14/2015 10:00 AM Medical Record Number: [id number] Patient Account Number: [id number] Date of Birth/Sex: Feb 10, 1921 (82 y.o. 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 N/A] Comorbid History: [6:Arrhythmia, Deep Vein Thrombosis, Peripheral Arterial Disease, Type II Diabetes] [N/A:N/A] Date Acquired: [6:06/12/2015] [N/A:N/A] Weeks of Treatment: [6:17] [N/A:N/A] Wound Status: [6:Open] [N/A:N/A] Measurements L x W x D 6x7x0.2 [N/A:N/A] (cm) Area (cm) : [6:32.987] [N/A:N/A] Volume (cm) : [6:6.597] [N/A:N/A] % Reduction in Area: [6:-4102.20%] [N/A:N/A] % Reduction  in Volume: -8250.60% [N/A:N/A] Classification: [6:Full Thickness Without Exposed Support Structures] [N/A:N/A] HBO Classification: [6:Grade 1] [N/A:N/A] Exudate Amount: [6:Medium] [N/A:N/A] Exudate Type: [6:Serosanguineous] [N/A:N/A] Exudate Color: [6:red, brown] [N/A:N/A] Wound Margin: [6:Flat and Intact] [N/A:N/A] Granulation Amount: [6:Medium (34-66%)] [N/A:N/A] Granulation Quality: [6:Pink] [N/A:N/A] Necrotic Amount: [6:Medium (34-66%)] [N/A:N/A] Exposed Structures: [N/A:N/A] Fascia: No Fat: No Tendon: No Muscle: No Joint: No Bone: No Limited to Skin Breakdown Epithelialization: None N/A N/A Periwound Skin Texture: Edema: Yes N/A N/A Excoriation: No Induration: No Callus: No Crepitus: No Fluctuance: No Friable: No Rash: No Scarring: No Periwound Skin Moist: Yes N/A N/A Moisture: Maceration: No Dry/Scaly: No Periwound Skin Color: Atrophie Blanche: No N/A N/A Cyanosis: No Ecchymosis: No Erythema: No Hemosiderin Staining: No Mottled: No Pallor: No Rubor:  No Temperature: No Abnormality N/A N/A Tenderness on Yes N/A N/A Palpation: Wound Preparation: Ulcer Cleansing: N/A N/A Rinsed/Irrigated with Saline Topical Anesthetic Applied: Other: lidocaine 4% Treatment Notes Electronic Signature(s) Signed: 10/14/2015 5:00:07 PM By: Elpidio Eric BSN, RN Entered By: Elpidio Eric on 10/14/2015 10:15:23 [name] ([id number]) -------------------------------------------------------------------------------- Multi-Disciplinary Care Plan Details Patient Name: [name] Date of Service: 10/14/2015 10:00 AM Medical Record Number: [id number] Patient Account Number: [id number] Date of Birth/Sex: Jan 29, 1921 (82 y.o. Female) Treating RN: Afful, RN, BSN, [place] Sink Primary Care Physician: Terance Hart, DAVID Other Clinician: Referring Physician: Terance Hart, DAVID Treating Physician/Extender: Rudene Re in Treatment: 47 Active Inactive Abuse / Safety / Falls / Self Care Management Nursing Diagnoses: Impaired physical mobility Potential for falls Goals: Patient will remain injury free Date Initiated: 02/11/2015 Goal Status: Active Patient/caregiver will verbalize understanding of skin care regimen Date Initiated: 02/11/2015 Goal Status: Active Patient/caregiver will verbalize/demonstrate measures taken to prevent injury and/or falls Date Initiated: 02/11/2015 Goal Status: Active Patient/caregiver will verbalize/demonstrate understanding of what to do in case of emergency Date Initiated: 02/11/2015 Goal Status: Active Interventions: Assess fall risk on admission and as needed Provide education on fall prevention Provide education on safe transfers Treatment Activities: Patient referred to home care : 10/14/2015 Notes: Nutrition Nursing Diagnoses: Imbalanced nutrition Potential for alteratiion in Nutrition/Potential for imbalanced nutrition [name], [name] ([id number]) Goals: Patient/caregiver verbalizes understanding of need to maintain  therapeutic glucose control per primary care physician Date Initiated: 02/11/2015 Goal Status: Active Patient/caregiver will maintain therapeutic glucose control Date Initiated: 02/11/2015 Goal Status: Active Interventions: Assess HgA1c results as ordered upon admission and as needed Provide education on elevated blood sugars and impact on wound healing Provide education on nutrition Treatment Activities: Education provided on Nutrition : 04/22/2015 Obtain HgA1c : 10/14/2015 Notes: Wound/Skin Impairment Nursing Diagnoses: Impaired tissue integrity Knowledge deficit related to ulceration/compromised skin integrity Goals: Patient/caregiver will verbalize understanding of skin care regimen Date Initiated: 02/11/2015 Goal Status: Active Ulcer/skin breakdown will heal within 14 weeks Date Initiated: 02/11/2015 Goal Status: Active Interventions: Assess patient/caregiver ability to obtain necessary supplies Assess patient/caregiver ability to perform ulcer/skin care regimen upon admission and as needed Assess ulceration(s) every visit Provide education on ulcer and skin care Treatment Activities: Skin care regimen initiated : 10/14/2015 Topical wound management initiated : 10/14/2015 Notes: [name], [name] ([id number]) Electronic Signature(s) Signed: 10/14/2015 5:00:07 PM By: Elpidio Eric BSN, RN Entered By: Elpidio Eric on 10/14/2015 10:13:15 [name] ([id number]) -------------------------------------------------------------------------------- Pain Assessment Details Patient Name: [name] Date of Service: 10/14/2015 10:00 AM Medical Record Number: [id number] Patient Account Number: [id number] Date of Birth/Sex: August 05, 1921 (82 y.o. Female) Treating RN: Afful, RN, BSN, [place] Sink Primary Care Physician: Dorothey Baseman Other Clinician: Referring Physician: Terance Hart DAVID Treating Physician/Extender: Rudene Re in Treatment: 55 Active  Problems Location of Pain Severity  and Description of Pain Patient Has Paino Yes Site Locations Pain Location: Generalized Pain, Pain in Ulcers Rate the pain. Current Pain Level: 8 Character of Pain Describe the Pain: Aching, Burning, Shooting, Tender Pain Management and Medication Current Pain Management: Medication: Yes Rest: Yes How does your pain impact your activities of daily livingo Sleep: Yes Bathing: Yes Appetite: Yes Relationship With Others: Yes Bladder Continence: Yes Emotions: Yes Bowel Continence: Yes Work: Yes Toileting: Yes Drive: Yes Dressing: Yes Hobbies: Yes Psychologist, prison and probation services) Signed: 10/14/2015 5:00:07 PM By: Elpidio Eric BSN, RN Entered By: Elpidio Eric on 10/14/2015 10:00:10 [name] ([id number]) -------------------------------------------------------------------------------- Patient/Caregiver Education Details Patient Name: [name] Date of Service: 10/14/2015 10:00 AM Medical Record Number: [id number] Patient Account Number: [id number] Date of Birth/Gender: 1921/03/06 (82 y.o. Female) Treating RN: Clover Mealy, RN, BSN, [place] Sink Primary Care Physician: Terance Hart, DAVID Other Clinician: Referring Physician: Terance Hart DAVID Treating Physician/Extender: Rudene Re in Treatment: 66 Education Assessment Education Provided To: Patient Education Topics Provided Elevated Blood Sugar/ Impact on Healing: Methods: Explain/Verbal Responses: State content correctly Nutrition: Methods: Explain/Verbal Responses: State content correctly Electronic Signature(s) Signed: 10/14/2015 5:00:07 PM By: Elpidio Eric BSN, RN Entered By: Elpidio Eric on 10/14/2015 10:35:52 [name] ([id number]) -------------------------------------------------------------------------------- Wound Assessment Details Patient Name: [name] Date of Service: 10/14/2015 10:00 AM Medical Record Number: [id number] Patient Account Number: [id number] Date of Birth/Sex: 08/10/1921 (82 y.o.  Female) Treating RN: Afful, RN, BSN, [place] Primary Care Physician: Terance Hart, DAVID Other Clinician: Referring Physician: Terance Hart, DAVID Treating Physician/Extender: Rudene Re in Treatment: 35 Wound Status Wound Number: 6 Primary Arterial Insufficiency Ulcer Etiology: Wound Location: Right Lower Leg - Posterior Wound Open Wounding Event: Blister Status: Date Acquired: 06/12/2015 Comorbid Arrhythmia, Deep Vein Thrombosis, Weeks Of Treatment: 17 History: Peripheral Arterial Disease, Type II Clustered Wound: No Diabetes Photos Photo Uploaded By: Elpidio Eric on 10/14/2015 16:33:55 Wound Measurements Length: (cm) 6 Width: (cm) 7 Depth: (cm) 0.2 Area: (cm) 32.987 Volume: (cm) 6.597 % Reduction in Area: -4102.2% % Reduction in Volume: -8250.6% Epithelialization: None Tunneling: No Undermining: No Wound Description Full Thickness Without Exposed Classification: Support Structures Diabetic Severity Grade 1 (Wagner): Pendergraph, Sadhana ([id number]) Foul Odor After Cleansing: No Wound Margin: Flat and Intact Exudate Amount: Medium Exudate Type: Serosanguineous Exudate Color: red, brown Wound Bed Granulation Amount: Medium (34-66%) Exposed Structure Granulation Quality: Pink Fascia Exposed: No Necrotic Amount: Medium (34-66%) Fat Layer Exposed: No Necrotic Quality: Adherent Slough Tendon Exposed: No Muscle Exposed: No Joint Exposed: No Bone Exposed: No Limited to Skin Breakdown Periwound Skin Texture Texture Color No Abnormalities Noted: No No Abnormalities Noted: No Callus: No Atrophie Blanche: No Crepitus: No Cyanosis: No Excoriation: No Ecchymosis: No Fluctuance: No Erythema: No Friable: No Hemosiderin Staining: No Induration: No Mottled: No Localized Edema: Yes Pallor: No Rash: No Rubor: No Scarring: No Temperature / Pain Moisture Temperature: No Abnormality No Abnormalities Noted: No Tenderness on Palpation: Yes Dry / Scaly:  No Maceration: No Moist: Yes Wound Preparation Ulcer Cleansing: Rinsed/Irrigated with Saline Topical Anesthetic Applied: Other: lidocaine 4%, Treatment Notes Wound #6 (Right, Posterior Lower Leg) 1. Cleansed with: Cleanse wound with antibacterial soap and water 4. Dressing Applied: Santyl Ointment 5. Secondary Dressing Applied ABD Pad Gauze and Kerlix/Conform Packman, T[place] ([id number]) 6. Footwear/Offloading device applied Other footwear/offloading device applied (specify in notes) 7. Secured with Tape Notes Optician, dispensing) Signed: 10/14/2015 5:00:07 PM By: Elpidio Eric BSN, RN Entered By: Elpidio Eric on 10/14/2015 10:03:36 [name] ([id number]) -------------------------------------------------------------------------------- Vitals Details Patient Name: Marita [place],  Tyffany Date of Service: 10/14/2015 10:00 AM Medical Record Number: [id number] Patient Account Number: [id number] Date of Birth/Sex: 1921-06-16 (82 y.o. Female) Treating RN: Afful, RN, BSN, [employer] Sink Primary Care Physician: Terance Hart, DAVID Other Clinician: Referring Physician: Terance Hart, DAVID Treating Physician/Extender: Rudene Re in Treatment: 35 Vital Signs Time Taken: 10:00 Temperature (F): 97.7 Height (in): 62 Pulse (bpm): 59 Weight (lbs): 155 Respiratory Rate (breaths/min): 18 Body Mass Index (BMI): 28.3 Blood Pressure (mmHg): 116/52 Reference Range: 80 - 120 mg / dl Electronic Signature(s) Signed: 10/14/2015 5:00:07 PM By: Elpidio Eric BSN, RN Entered By: Elpidio Eric on 10/14/2015 10:00:32

## 2015-10-28 ENCOUNTER — Encounter: Payer: Medicare Other | Admitting: Surgery

## 2015-10-28 DIAGNOSIS — E11621 Type 2 diabetes mellitus with foot ulcer: Secondary | ICD-10-CM | POA: Diagnosis not present

## 2015-10-29 NOTE — Progress Notes (Signed)
[name], [name] ([id number]) Visit Report for 10/28/2015 Arrival Information Details Patient Name: [name], [name] Date of Service: 10/28/2015 10:00 AM Medical Record Number: [id number] Patient Account Number: [id number] Date of Birth/Sex: 1920-12-25 (82 y.o. Female) Treating RN: Curtis Sites Primary Care Physician: Dorothey Baseman Other Clinician: Referring Physician: Dorothey Baseman Treating Physician/Extender: Rudene Re in Treatment: 37 Visit Information History Since Last Visit Added or deleted any medications: No Patient Arrived: Wheel Chair Any new allergies or adverse reactions: No Arrival Time: 10:10 Had a fall or experienced change in No activities of daily living that may affect Accompanied By: dtr risk of falls: Transfer Assistance: Manual Signs or symptoms of abuse/neglect since last No Patient Identification Verified: Yes visito Secondary Verification Process Yes Hospitalized since last visit: No Completed: Pain Present Now: No Patient Has Alerts: Yes Electronic Signature(s) Signed: 10/28/2015 4:04:05 PM By: Curtis Sites Entered By: Curtis Sites on 10/28/2015 10:11:22 [name] ([id number]) -------------------------------------------------------------------------------- Clinic Level of Care Assessment Details Patient Name: [name] Date of Service: 10/28/2015 10:00 AM Medical Record Number: [id number] Patient Account Number: [id number] Date of Birth/Sex: December 15, 1920 (82 y.o. Female) Treating RN: Curtis Sites Primary Care Physician: Terance Hart, DAVID Other Clinician: Referring Physician: Terance Hart, DAVID Treating Physician/Extender: Rudene Re in Treatment: 37 Clinic Level of Care Assessment Items TOOL 4 Quantity Score []  - Use when only an EandM is performed on FOLLOW-UP visit 0 ASSESSMENTS - Nursing Assessment / Reassessment X - Reassessment of Co-morbidities (includes updates in patient status) 1 10 X - Reassessment of  Adherence to Treatment Plan 1 5 ASSESSMENTS - Wound and Skin Assessment / Reassessment []  - Simple Wound Assessment / Reassessment - one wound 0 X - Complex Wound Assessment / Reassessment - multiple wounds 3 5 []  - Dermatologic / Skin Assessment (not related to wound area) 0 ASSESSMENTS - Focused Assessment []  - Circumferential Edema Measurements - multi extremities 0 []  - Nutritional Assessment / Counseling / Intervention 0 X - Lower Extremity Assessment (monofilament, tuning fork, pulses) 1 5 []  - Peripheral Arterial Disease Assessment (using hand held doppler) 0 ASSESSMENTS - Ostomy and/or Continence Assessment and Care []  - Incontinence Assessment and Management 0 []  - Ostomy Care Assessment and Management (repouching, etc.) 0 PROCESS - Coordination of Care X - Simple Patient / Family Education for ongoing care 1 15 []  - Complex (extensive) Patient / Family Education for ongoing care 0 []  - Staff obtains Chiropractor, Records, Test Results / Process Orders 0 []  - Staff telephones HHA, Nursing Homes / Clarify orders / etc 0 []  - Routine Transfer to another Facility (non-emergent condition) 0 Dawkins, Marquette ([id number]) []  - Routine Hospital Admission (non-emergent condition) 0 []  - New Admissions / Manufacturing engineer / Ordering NPWT, Apligraf, etc. 0 []  - Emergency Hospital Admission (emergent condition) 0 X - Simple Discharge Coordination 1 10 []  - Complex (extensive) Discharge Coordination 0 PROCESS - Special Needs []  - Pediatric / Minor Patient Management 0 []  - Isolation Patient Management 0 []  - Hearing / Language / Visual special needs 0 []  - Assessment of Community assistance (transportation, D/C planning, etc.) 0 []  - Additional assistance / Altered mentation 0 []  - Support Surface(s) Assessment (bed, cushion, seat, etc.) 0 INTERVENTIONS - Wound Cleansing / Measurement []  - Simple Wound Cleansing - one wound 0 X - Complex Wound Cleansing - multiple wounds 3 5 X -  Wound Imaging (photographs - any number of wounds) 1 5 []  - Wound Tracing (instead of photographs) 0 []  - Simple Wound Measurement - one wound 0 X -  Complex Wound Measurement - multiple wounds 3 5 INTERVENTIONS - Wound Dressings X - Small Wound Dressing one or multiple wounds 3 10 []  - Medium Wound Dressing one or multiple wounds 0 []  - Large Wound Dressing one or multiple wounds 0 []  - Application of Medications - topical 0 []  - Application of Medications - injection 0 INTERVENTIONS - Miscellaneous []  - External ear exam 0 Zacarias, Wylene ([id number]) []  - Specimen Collection (cultures, biopsies, blood, body fluids, etc.) 0 []  - Specimen(s) / Culture(s) sent or taken to Lab for analysis 0 []  - Patient Transfer (multiple staff / Michiel Sites Lift / Similar devices) 0 []  - Simple Staple / Suture removal (25 or less) 0 []  - Complex Staple / Suture removal (26 or more) 0 []  - Hypo / Hyperglycemic Management (close monitor of Blood Glucose) 0 []  - Ankle / Brachial Index (ABI) - do not check if billed separately 0 X - Vital Signs 1 5 Has the patient been seen at the hospital within the last three years: Yes Total Score: 130 Level Of Care: New/Established - Level 4 Electronic Signature(s) Signed: 10/28/2015 3:45:56 PM By: Curtis Sites Entered By: Curtis Sites on 10/28/2015 15:45:56 [name] ([id number]) -------------------------------------------------------------------------------- Encounter Discharge Information Details Patient Name: [name] Date of Service: 10/28/2015 10:00 AM Medical Record Number: [id number] Patient Account Number: [id number] Date of Birth/Sex: Oct 29, 1920 (82 y.o. Female) Treating RN: Curtis Sites Primary Care Physician: Dorothey Baseman Other Clinician: Referring Physician: Dorothey Baseman Treating Physician/Extender: Rudene Re in Treatment: 37 Encounter Discharge Information Items Discharge Pain Level: 0 Discharge Condition:  Stable Ambulatory Status: Wheelchair Discharge Destination: Home Transportation: Private Auto Accompanied By: daughter Schedule Follow-up Appointment: Yes Medication Reconciliation completed and provided to Patient/Care No Lawren Sexson: Provided on Clinical Summary of Care: 10/28/2015 Form Type Recipient Paper Patient HS Electronic Signature(s) Signed: 10/28/2015 11:07:07 AM By: Gwenlyn Perking Entered By: Gwenlyn Perking on 10/28/2015 11:07:07 [name] ([id number]) -------------------------------------------------------------------------------- Lower Extremity Assessment Details Patient Name: [name] Date of Service: 10/28/2015 10:00 AM Medical Record Number: [id number] Patient Account Number: [id number] Date of Birth/Sex: [birth year] (82 y.o. Female) Treating RN: Curtis Sites Primary Care Physician: Dorothey Baseman Other Clinician: Referring Physician: Dorothey Baseman Treating Physician/Extender: Rudene Re in Treatment: 37 Edema Assessment Assessed: [Left: No] [Right: No] Edema: [Left: Ye] [Right: s] Vascular Assessment Pulses: Posterior Tibial Extremity colors, hair growth, and conditions: Extremity Color: [Left:Red] Hair Growth on Extremity: [Left:No] Temperature of Extremity: [Left:Warm] Capillary Refill: [Left:< 3 seconds] Electronic Signature(s) Signed: 10/28/2015 4:04:05 PM By: Curtis Sites Entered By: Curtis Sites on 10/28/2015 10:21:26 [name] ([id number]) -------------------------------------------------------------------------------- Multi Wound Chart Details Patient Name: [name] Date of Service: 10/28/2015 10:00 AM Medical Record Number: [id number] Patient Account Number: [id number] Date of Birth/Sex: [birth year] (82 y.o. Female) Treating RN: Curtis Sites Primary Care Physician: Dorothey Baseman Other Clinician: Referring Physician: Terance Hart, DAVID Treating Physician/Extender: Rudene Re in Treatment:  37 Vital Signs Height(in): 62 Pulse(bpm): 65 Weight(lbs): 155 Blood Pressure 111/42 (mmHg): Body Mass Index(BMI): 28 Temperature(F): 98.0 Respiratory Rate 16 (breaths/min): Photos: [10:No Photos] [6:No Photos] [9:No Photos] Wound Location: [10:Right Lower Leg - Anterior, Distal] [6:Right Lower Leg - Posterior] [9:Right Lower Leg - Anterior] Wounding Event: [10:Gradually Appeared] [6:Blister] [9:Gradually Appeared] 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 of] [9:the Lower Extremity] Comorbid History: [10:Arrhythmia, Deep Vein Thrombosis, Peripheral Arterial Disease, Type II Diabetes] [6:Arrhythmia, Deep Vein Thrombosis, Peripheral Arterial Disease, Type II Arterial Disease, Type II Diabetes] [9:Arrhythmia, Deep Vein Thrombosis,  Peripheral Diabetes] Date Acquired: [  10:08/31/2015] [6:06/12/2015] [9:08/31/2015] Weeks of Treatment: [10:8] [6:19] [9:8] Wound Status: [10:Open] [6:Open] [9:Open] Measurements L x W x D 0.3x0.3x0.1 [6:9x9x0.2] [9:0.7x0.7x0.2] (cm) Area (cm) : [10:0.071] [6:63.617] [9:0.385] Volume (cm) : [10:0.007] [6:12.723] [9:0.077] % Reduction in Area: [10:63.80%] [6:-8004.10%] [9:-22.60%] % Reduction in Volume: 92.90% [6:-16005.10%] [9:-22.20%] Classification: [10:Grade 1] [6:Full Thickness Without Exposed Support Structures] [9:Grade 1] HBO Classification: [10:N/A] [6:Grade 1] [9:N/A] Exudate Amount: [10:Large] [6:Large] [9:Large] Exudate Type: [10:Serous] [6:Serous] [9:Serous] Exudate Color: [10:amber] [6:amber] [9:amber] Wound Margin: [10:Flat and Intact] [6:Flat and Intact] [9:Flat and Intact] Granulation Amount: [10:Small (1-33%)] [6:Medium (34-66%)] [9:Large (67-100%)] Granulation Quality: [10:Pink] [6:Pink] [9:Pink] Necrotic Amount: [10:Large (67-100%)] [6:Medium (34-66%)] [9:Small (1-33%)] Exposed Structures: Fascia: No Fascia: No Fascia: No Fat: No Fat: No Fat: No Tendon:  No Tendon: No Tendon: No Muscle: No Muscle: No Muscle: No Joint: No Joint: No Joint: No Bone: No Bone: No Bone: No Limited to Skin Limited to Skin Limited to Skin Breakdown Breakdown Breakdown Epithelialization: None None None Periwound Skin Texture: Edema: Yes Edema: Yes Edema: Yes Excoriation: No Excoriation: No Excoriation: No Induration: No Induration: No Induration: No Callus: No Callus: No Callus: No Crepitus: No Crepitus: No Crepitus: No Fluctuance: No Fluctuance: No Fluctuance: No Friable: No Friable: No Friable: No Rash: No Rash: No Rash: No Scarring: No Scarring: No Scarring: No Periwound Skin Maceration: Yes Moist: Yes Maceration: Yes Moisture: Moist: Yes Maceration: No Moist: Yes Dry/Scaly: No Dry/Scaly: No Dry/Scaly: No Periwound Skin Color: Atrophie Blanche: No Atrophie Blanche: No Erythema: Yes Cyanosis: No Cyanosis: No Atrophie Blanche: No Ecchymosis: No Ecchymosis: No Cyanosis: No Erythema: No Erythema: No Ecchymosis: No Hemosiderin Staining: No Hemosiderin Staining: No Hemosiderin Staining: No Mottled: No Mottled: No Mottled: No Pallor: No Pallor: No Pallor: No Rubor: No Rubor: No Rubor: No Erythema Location: N/A N/A Circumferential Temperature: N/A No Abnormality N/A Tenderness on Yes Yes Yes Palpation: Wound Preparation: Ulcer Cleansing: Ulcer Cleansing: Ulcer Cleansing: Rinsed/Irrigated with Rinsed/Irrigated with Rinsed/Irrigated with Saline Saline Saline Topical Anesthetic Topical Anesthetic Topical Anesthetic Applied: Other: lidocaine Applied: Other: lidocaine Applied: Other: lidocaine 4% 4% 4% Treatment Notes Electronic Signature(s) Signed: 10/28/2015 4:04:05 PM By: Curtis Sites Entered By: Curtis Sites on 10/28/2015 10:32:35 [name] ([id number]) -------------------------------------------------------------------------------- Multi-Disciplinary Care Plan Details Patient Name: [name] Date of Service: 10/28/2015 10:00 AM Medical Record Number: [id number] Patient Account Number: [id number] Date of Birth/Sex: 04/20/1921 (82 y.o. Female) Treating RN: Curtis Sites Primary Care Physician: Dorothey Baseman Other Clinician: Referring Physician: Dorothey Baseman Treating Physician/Extender: Rudene Re in Treatment: 16 Active Inactive Abuse / Safety / Falls / Self Care Management Nursing Diagnoses: Impaired physical mobility Potential for falls Goals: Patient will remain injury free Date Initiated: 02/11/2015 Goal Status: Active Patient/caregiver will verbalize understanding of skin care regimen Date Initiated: 02/11/2015 Goal Status: Active Patient/caregiver will verbalize/demonstrate measures taken to prevent injury and/or falls Date Initiated: 02/11/2015 Goal Status: Active Patient/caregiver will verbalize/demonstrate understanding of what to do in case of emergency Date Initiated: 02/11/2015 Goal Status: Active Interventions: Assess fall risk on admission and as needed Provide education on fall prevention Provide education on safe transfers Treatment Activities: Patient referred to home care : 10/28/2015 Notes: Nutrition Nursing Diagnoses: Imbalanced nutrition Potential for alteratiion in Nutrition/Potential for imbalanced nutrition Hua, Britiney ([id number]) Goals: Patient/caregiver verbalizes understanding of need to maintain therapeutic glucose control per primary care physician Date Initiated: 02/11/2015 Goal Status: Active Patient/caregiver will maintain therapeutic glucose control Date Initiated: 02/11/2015 Goal Status: Active Interventions: Assess HgA1c results as ordered upon admission and as needed Provide education  on elevated blood sugars and impact on wound healing Provide education on nutrition Treatment Activities: Education provided on Nutrition : 04/22/2015 Obtain HgA1c : 10/28/2015 Notes: Wound/Skin Impairment Nursing  Diagnoses: Impaired tissue integrity Knowledge deficit related to ulceration/compromised skin integrity Goals: Patient/caregiver will verbalize understanding of skin care regimen Date Initiated: 02/11/2015 Goal Status: Active Ulcer/skin breakdown will heal within 14 weeks Date Initiated: 02/11/2015 Goal Status: Active Interventions: Assess patient/caregiver ability to obtain necessary supplies Assess patient/caregiver ability to perform ulcer/skin care regimen upon admission and as needed Assess ulceration(s) every visit Provide education on ulcer and skin care Treatment Activities: Skin care regimen initiated : 10/28/2015 Topical wound management initiated : 10/28/2015 Notes: LUCIA, HARM ([id number]) Electronic Signature(s) Signed: 10/28/2015 4:04:05 PM By: Curtis Sites Entered By: Curtis Sites on 10/28/2015 10:32:20 [name] ([id number]) -------------------------------------------------------------------------------- Patient/Caregiver Education Details Patient Name: [name] Date of Service: 10/28/2015 10:00 AM Medical Record Number: [id number] Patient Account Number: [id number] Date of Birth/Gender: [birth year] (82 y.o. Female) Treating RN: Curtis Sites Primary Care Physician: Dorothey Baseman Other Clinician: Referring Physician: Dorothey Baseman Treating Physician/Extender: Rudene Re in Treatment: 13 Education Assessment Education Provided To: Patient and Caregiver Education Topics Provided Wound/Skin Impairment: Handouts: Other: new wound care as ordered Methods: Demonstration, Explain/Verbal Responses: State content correctly Electronic Signature(s) Signed: 10/28/2015 4:04:05 PM By: Curtis Sites Entered By: Curtis Sites on 10/28/2015 11:04:03 [name] ([id number]) -------------------------------------------------------------------------------- Wound Assessment Details Patient Name: [name] Date of Service: 10/28/2015  10:00 AM Medical Record Number: [id number] Patient Account Number: [id number] Date of Birth/Sex: 1921/07/19 (82 y.o. Female) Treating RN: Curtis Sites Primary Care Physician: Dorothey Baseman Other Clinician: Referring Physician: Terance Hart, DAVID Treating Physician/Extender: Rudene Re in Treatment: 37 Wound Status Wound Number: 10 Primary Diabetic Wound/Ulcer of the Lower Etiology: Extremity Wound Location: Right Lower Leg - Anterior, Distal Wound Open Status: Wounding Event: Gradually Appeared Comorbid Arrhythmia, Deep Vein Thrombosis, Date Acquired: 08/31/2015 History: Peripheral Arterial Disease, Type II Weeks Of Treatment: 8 Diabetes Clustered Wound: No Photos Photo Uploaded By: Curtis Sites on 10/28/2015 15:26:20 Wound Measurements Length: (cm) 0.3 Width: (cm) 0.3 Depth: (cm) 0.1 Area: (cm) 0.071 Volume: (cm) 0.007 % Reduction in Area: 63.8% % Reduction in Volume: 92.9% Epithelialization: None Tunneling: No Undermining: No Wound Description Classification: Grade 1 Wound Margin: Flat and Intact Exudate Amount: Large Exudate Type: Serous Exudate Color: amber Foul Odor After Cleansing: No Wound Bed Granulation Amount: Small (1-33%) Exposed Structure Granulation Quality: Pink Fascia Exposed: No Necrotic Amount: Large (67-100%) Fat Layer Exposed: No Beadles, Christiane ([id number]) Necrotic Quality: Adherent Slough Tendon Exposed: No Muscle Exposed: No Joint Exposed: No Bone Exposed: No Limited to Skin Breakdown Periwound Skin Texture Texture Color No Abnormalities Noted: No No Abnormalities Noted: No Callus: No Atrophie Blanche: No Crepitus: No Cyanosis: No Excoriation: No Ecchymosis: No Fluctuance: No Erythema: No Friable: No Hemosiderin Staining: No Induration: No Mottled: No Localized Edema: Yes Pallor: No Rash: No Rubor: No Scarring: No Temperature / Pain Moisture Tenderness on Palpation: Yes No Abnormalities Noted: No Dry /  Scaly: No Maceration: Yes Moist: Yes Wound Preparation Ulcer Cleansing: Rinsed/Irrigated with Saline Topical Anesthetic Applied: Other: lidocaine 4%, Treatment Notes Wound #10 (Right, Distal, Anterior Lower Leg) 1. Cleansed with: Clean wound with Normal Saline 2. Anesthetic Topical Lidocaine 4% cream to wound bed prior to debridement 4. Dressing Applied: Aquacel Ag 5. Secondary Dressing Applied ABD and Kerlix/Conform 7. Secured with Secretary/administrator) Signed: 10/28/2015 4:04:05 PM By: Curtis Sites Entered By: Curtis Sites on 10/28/2015 10:26:14 [name] ([id number]) -------------------------------------------------------------------------------- Wound  Assessment Details Patient Name: JANNELLE, NOTARO Date of Service: 10/28/2015 10:00 AM Medical Record Number: [id number] Patient Account Number: [id number] Date of Birth/Sex: Jan 07, 1921 (82 y.o. Female) Treating RN: Curtis Sites Primary Care Physician: Dorothey Baseman Other Clinician: Referring Physician: Terance Hart, DAVID Treating Physician/Extender: Rudene Re in Treatment: 37 Wound Status Wound Number: 6 Primary Arterial Insufficiency Ulcer Etiology: Wound Location: Right Lower Leg - Posterior Wound Open Wounding Event: Blister Status: Date Acquired: 06/12/2015 Comorbid Arrhythmia, Deep Vein Thrombosis, Weeks Of Treatment: 19 History: Peripheral Arterial Disease, Type II Clustered Wound: No Diabetes Photos Photo Uploaded By: Curtis Sites on 10/28/2015 15:26:47 Wound Measurements Length: (cm) 9 Width: (cm) 9 Depth: (cm) 0.2 Area: (cm) 63.617 Volume: (cm) 12.723 % Reduction in Area: -8004.1% % Reduction in Volume: -16005.1% Epithelialization: None Tunneling: No Undermining: No Wound Description Full Thickness Without Foul Odor Aft Classification: Exposed Support Structures Diabetic Severity Grade 1 (Wagner): Wound Margin: Flat and Intact Exudate Amount: Large Exudate  Type: Serous Exudate Color: amber er Cleansing: No Wound Bed Granulation Amount: Medium (34-66%) Exposed Structure Yaw, Jennette ([id number]) Granulation Quality: Pink Fascia Exposed: No Necrotic Amount: Medium (34-66%) Fat Layer Exposed: No Necrotic Quality: Adherent Slough Tendon Exposed: No Muscle Exposed: No Joint Exposed: No Bone Exposed: No Limited to Skin Breakdown Periwound Skin Texture Texture Color No Abnormalities Noted: No No Abnormalities Noted: No Callus: No Atrophie Blanche: No Crepitus: No Cyanosis: No Excoriation: No Ecchymosis: No Fluctuance: No Erythema: No Friable: No Hemosiderin Staining: No Induration: No Mottled: No Localized Edema: Yes Pallor: No Rash: No Rubor: No Scarring: No Temperature / Pain Moisture Temperature: No Abnormality No Abnormalities Noted: No Tenderness on Palpation: Yes Dry / Scaly: No Maceration: No Moist: Yes Wound Preparation Ulcer Cleansing: Rinsed/Irrigated with Saline Topical Anesthetic Applied: Other: lidocaine 4%, Treatment Notes Wound #6 (Right, Posterior Lower Leg) 1. Cleansed with: Clean wound with Normal Saline 2. Anesthetic Topical Lidocaine 4% cream to wound bed prior to debridement 4. Dressing Applied: Aquacel Ag 5. Secondary Dressing Applied ABD and Kerlix/Conform 7. Secured with Secretary/administrator) Signed: 10/28/2015 4:04:05 PM By: Curtis Sites Entered By: Curtis Sites on 10/28/2015 10:29:30 [name] ([id number]) Marita [place], Eyvonne Left ([id number]) -------------------------------------------------------------------------------- Wound Assessment Details Patient Name: [name] Date of Service: 10/28/2015 10:00 AM Medical Record Number: [id number] Patient Account Number: [id number] Date of Birth/Sex: [birth year] (82 y.o. Female) Treating RN: Curtis Sites Primary Care Physician: Dorothey Baseman Other Clinician: Referring Physician: Terance Hart, DAVID Treating  Physician/Extender: Rudene Re in Treatment: 37 Wound Status Wound Number: 9 Primary Diabetic Wound/Ulcer of the Lower Etiology: Extremity Wound Location: Right Lower Leg - Anterior Wound Open Wounding Event: Gradually Appeared Status: Date Acquired: 08/31/2015 Comorbid Arrhythmia, Deep Vein Thrombosis, Weeks Of Treatment: 8 History: Peripheral Arterial Disease, Type II Clustered Wound: No Diabetes Photos Photo Uploaded By: Curtis Sites on 10/28/2015 15:26:21 Wound Measurements Length: (cm) 0.7 Width: (cm) 0.7 Depth: (cm) 0.2 Area: (cm) 0.385 Volume: (cm) 0.077 % Reduction in Area: -22.6% % Reduction in Volume: -22.2% Epithelialization: None Tunneling: No Undermining: No Wound Description Classification: Grade 1 Wound Margin: Flat and Intact Exudate Amount: Large Exudate Type: Serous Exudate Color: amber Foul Odor After Cleansing: No Wound Bed Granulation Amount: Large (67-100%) Exposed Structure Granulation Quality: Pink Fascia Exposed: No Necrotic Amount: Small (1-33%) Fat Layer Exposed: No Necrotic Quality: Adherent Slough Tendon Exposed: No Riddell, Daniella ([id number]) Muscle Exposed: No Joint Exposed: No Bone Exposed: No Limited to Skin Breakdown Periwound Skin Texture Texture Color No Abnormalities Noted: No No Abnormalities Noted: No Callus: No Atrophie Blanche: No  Crepitus: No Cyanosis: No Excoriation: No Ecchymosis: No Fluctuance: No Erythema: Yes Friable: No Erythema Location: Circumferential Induration: No Hemosiderin Staining: No Localized Edema: Yes Mottled: No Rash: No Pallor: No Scarring: No Rubor: No Moisture Temperature / Pain No Abnormalities Noted: No Tenderness on Palpation: Yes Dry / Scaly: No Maceration: Yes Moist: Yes Wound Preparation Ulcer Cleansing: Rinsed/Irrigated with Saline Topical Anesthetic Applied: Other: lidocaine 4%, Treatment Notes Wound #9 (Right, Anterior Lower Leg) 1. Cleansed  with: Clean wound with Normal Saline 2. Anesthetic Topical Lidocaine 4% cream to wound bed prior to debridement 4. Dressing Applied: Aquacel Ag 5. Secondary Dressing Applied ABD and Kerlix/Conform 7. Secured with Secretary/administrator) Signed: 10/28/2015 4:04:05 PM By: Curtis Sites Entered By: Curtis Sites on 10/28/2015 10:24:07 [name] ([id number]) -------------------------------------------------------------------------------- Vitals Details Patient Name: [name] Date of Service: 10/28/2015 10:00 AM Medical Record Number: [id number] Patient Account Number: [id number] Date of Birth/Sex: 03-21-1921 (82 y.o. Female) Treating RN: Curtis Sites Primary Care Physician: Dorothey Baseman Other Clinician: Referring Physician: Terance Hart, DAVID Treating Physician/Extender: Rudene Re in Treatment: 37 Vital Signs Time Taken: 10:12 Temperature (F): 98.0 Height (in): 62 Pulse (bpm): 65 Weight (lbs): 155 Respiratory Rate (breaths/min): 16 Body Mass Index (BMI): 28.3 Blood Pressure (mmHg): 111/42 Reference Range: 80 - 120 mg / dl Electronic Signature(s) Signed: 10/28/2015 4:04:05 PM By: Curtis Sites Entered By: Curtis Sites on 10/28/2015 10:14:40

## 2015-10-29 NOTE — Progress Notes (Signed)
KYNDAL, HERINGER ([id number]) Visit Report for 10/28/2015 Chief Complaint Document Details Patient Name: [name], [name] Date of Service: 10/28/2015 10:00 AM Medical Record Number: [id number] Patient Account Number: [id number] Date of Birth/Sex: Jan 27, 1921 (82 y.o. Female) Treating RN: Curtis Sites Primary Care Physician: Dorothey Baseman Other Clinician: Referring Physician: Dorothey Baseman Treating Physician/Extender: Rudene Re in Treatment: 37 Information Obtained from: Patient Chief Complaint Patient presents to the wound care [name] for a consult due non healing wound. 82 year old patient with comes with a history of a ulcerated area to the right third toe and the right heel which she's had for about 2 months. Electronic Signature(s) Signed: 10/28/2015 11:14:26 AM By: Evlyn Kanner MD, FACS Entered By: Evlyn Kanner on 10/28/2015 11:14:26 [name] ([id number]) -------------------------------------------------------------------------------- HPI Details Patient Name: [name] Date of Service: 10/28/2015 10:00 AM Medical Record Number: [id number] Patient Account Number: [id number] Date of Birth/Sex: 1920/10/01 (82 y.o. Female) Treating RN: Curtis Sites Primary Care Physician: Dorothey Baseman Other Clinician: Referring Physician: Dorothey Baseman Treating Physician/Extender: Rudene Re in Treatment: 37 History of Present Illness Location: right medial heel and right third toe Quality: Patient reports experiencing a dull pain to affected area(s). Severity: Patient states wound are getting worse. Duration: Patient has had the wound for > 3 months prior to seeking treatment at the wound [name] Timing: Pain in wound is Intermittent (comes and goes Context: The wound appeared gradually over time Modifying Factors: Consults to this date include:vascular surgeon and several recent surgeries. Associated Signs and Symptoms: Patient reports having foul odor  and drainage from the [name]. HPI Description: A pleasant 82 year old patient who is known to have diabetes mellitus for several years has recently gone through a series of operations with the vascular surgeon Dr. Gilda Crease. In January and February she had several surgeries on [name]. 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 On 01/07/2015 she also had a right lower extremity distal runoff with a angioplasty of the right anterior tibial artery to maximize her blood flow to the foot. She recently had her staples removed at Dr. Marijean Heath office on 01/31/2015 and at that time a right lower extremity duplex was done which showed patent vessels and a patent stent with distal occluded posterior tibial artery. Though the duplex was noncritical the recommendations from the PA at the vascular surgery office was that of a angiogram to be done but the patient said she would rather try some bone care before. The patient has received doxycycline and Cipro in the recent past and she takes oral medications for her diabetes. Other than that I reviewed her list of all her medications. No recent hemoglobin A1c has been done and no recent x-rays of the right foot have been done. 02/18/2015 -- x-ray of the right foot was done on 02/11/2015 and it shows no evidence of acute osteomyelitis of the third toe or of the calcaneus. She had gone to the vascular surgery office and they had noted that there is dehiscence of [name] of the below-knee wound and they have asked [place] to kindly take over the care of this. There've been doing dressings for the right heel and right third toe. 02/25/2015 -- we have received notes from the vascular group who saw her last on 02/13/2015 and she was seen by the PA Ms.  Cleda Daub. She had recommended that the patient continue to follow with [place] for wound care including management of [name], which has had dehiscence of the lateral part. They will consider repeating a arterial duplex or angiogram if the right lower extremity does not heal within a reasonable period of time. 03/18/2015 -- he saw the vascular surgeon Dr. Gilda Crease and he has set her up for an angioplasty sometime in the middle of July. she is doing well otherwise. [name], [name] ([id number]) 04/10/2015 -- the patient had a procedure done on 04/08/2015 and this was a right angioplasty of the dorsalis pedis, anterior tibial artery, first superficial femoral artery in its midportion. There was successful intervention with recanalization and in-line flow to the right foot. 04/22/2015 -- the patient was looking rather pale today and the daughter confirms that her hemoglobin was down to 6.9. she is being monitored by her PCP. Her Lasix dose was increased and her edema has gone down significantly. 04/29/2015 -- she had vascular studies done and the ABI on the right is 1.3 and the toe pressures were within normal limits. Her hemoglobin is still around 7 and she refuses to take any blood transfusions for religious reasons. Her potassium was normal. 06/10/2015 -- she has a new blister on her right lateral and posterior part of her leg just in the region where the Kerlix bandages were applied and this may be due to an abrasion. 06/24/2015 -- On her right lower extremity she has developed several more blisters which look like small pustules and the drain informed shallow ulcerations. 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 07/01/2015 -- she has an appointment with the PCP this Friday and her vascular surgeon on Monday. Other than that she is on doxycycline and has finished 1 week of treatment. The pustules she had all over have now resolved. 07/08/2015 -- she saw the vascular surgeon who did  studies in the office and found that there is poor circulation in the distal lower right leg and has set her up for an angiogram and possible stenting next Tuesday. 07/22/2015 -- on October 18 she was taken up for a successful revascularization of the anterior tibial vessel on the right side. a PTA and stent placement was done to the right anterior tibial artery. 08/19/2015 -- she has broken out with some linear ulceration in the region of her webspaces of her toes and this is something new. 08/26/2015 -- over the last 3 days there was a streak of redness going from her lower extremity towards her toes but she had no fever or discharge from the wounds. 09/02/2015 -- the redness and cellulitis has gone down but she continues to have a lot of edema of the right lower extremity. 10/28/2015 -- over the last week she has been draining a lot of fluid from her right lower extremities and several of the wounds which had healed in the past and now opened up again Electronic Signature(s) Signed: 10/28/2015 11:14:54 AM By: Evlyn Kanner MD, FACS Entered By: Evlyn Kanner on 10/28/2015 11:14:53 [name] ([id number]) -------------------------------------------------------------------------------- Physical Exam Details Patient Name: [name] Date of Service: 10/28/2015 10:00 AM Medical Record Number: [id number] Patient Account Number: [id number] Date of Birth/Sex: 07/29/1921 (82 y.o. Female) Treating RN: Curtis Sites Primary Care Physician: Dorothey Baseman Other Clinician: Referring Physician: Dorothey Baseman Treating Physician/Extender: Rudene Re in Treatment: 37 Constitutional . Pulse regular. Respirations normal and unlabored. Afebrile. . Eyes Nonicteric. Reactive to light. Ears, Nose, Mouth, and Throat Lips, teeth,  and gums WNL.Marland Kitchen Moist mucosa without lesions. Neck supple and nontender. No palpable supraclavicular or cervical adenopathy. Normal sized without  goiter. Respiratory WNL. No retractions.. Cardiovascular Pedal Pulses WNL. the right lower extremity has significant lymphedema and its +2 pitting. Chest Breasts symmetical and no nipple discharge.. Breast tissue WNL, no masses, lumps, or tenderness.. Gastrointestinal (GI) Abdomen without masses or tenderness.. No liver or spleen enlargement or tenderness.. Lymphatic No adneopathy. No adenopathy. No adenopathy. Musculoskeletal Adexa without tenderness or enlargement.. Digits and nails w/o clubbing, cyanosis, infection, petechiae, ischemia, or inflammatory conditions.. Integumentary (Hair, Skin) No suspicious lesions. No crepitus or fluctuance. No peri-wound warmth or erythema. No masses.Marland Kitchen Psychiatric Judgement and insight Intact.. No evidence of depression, anxiety, or agitation.. Notes some of the wounds have a thin film of debris which I washed out with saline and some of it is rather excoriated and tender. There is no definite cellulitis but the foot is quite shiny thinned out skin due to the edema Electronic Signature(s) Signed: 10/28/2015 11:15:57 AM By: Evlyn Kanner MD, FACS Entered By: Evlyn Kanner on 10/28/2015 11:15:56 [name] ([id number]) [name], [name] ([id number]) -------------------------------------------------------------------------------- Physician Orders Details Patient Name: [name] Date of Service: 10/28/2015 10:00 AM Medical Record Number: [id number] Patient Account Number: [id number] Date of Birth/Sex: 08-06-1921 (82 y.o. Female) Treating RN: Curtis Sites Primary Care Physician: Dorothey Baseman Other Clinician: Referring Physician: Dorothey Baseman Treating Physician/Extender: Rudene Re in Treatment: 76 Verbal / Phone Orders: Yes Clinician: Curtis Sites Read Back and Verified: Yes Diagnosis Coding Wound Cleansing Wound #10 Right,Distal,Anterior Lower Leg o Cleanse wound with mild soap and water o May Shower, gently pat  wound dry prior to applying new dressing. Wound #6 Right,Posterior Lower Leg o Cleanse wound with mild soap and water o May Shower, gently pat wound dry prior to applying new dressing. Wound #9 Right,Anterior Lower Leg o Cleanse wound with mild soap and water o May Shower, gently pat wound dry prior to applying new dressing. Anesthetic Wound #10 Right,Distal,Anterior Lower Leg o Topical Lidocaine 4% cream applied to wound bed prior to debridement Wound #6 Right,Posterior Lower Leg o Topical Lidocaine 4% cream applied to wound bed prior to debridement Wound #9 Right,Anterior Lower Leg o Topical Lidocaine 4% cream applied to wound bed prior to debridement Primary Wound Dressing Wound #10 Right,Distal,Anterior Lower Leg o Aquacel Ag Wound #6 Right,Posterior Lower Leg o Aquacel Ag Wound #9 Right,Anterior Lower Leg o Aquacel Ag Secondary Dressing Wound #10 Right,Distal,Anterior Lower Leg o Gauze, ABD and Kerlix/Conform [name], [name] ([id number]) Wound #6 Right,Posterior Lower Leg o Gauze, ABD and Kerlix/Conform Wound #9 Right,Anterior Lower Leg o Gauze, ABD and Kerlix/Conform Dressing Change Frequency Wound #10 Right,Distal,Anterior Lower Leg o Change dressing every day. Wound #6 Right,Posterior Lower Leg o Change dressing every day. Wound #9 Right,Anterior Lower Leg o Change dressing every day. Follow-up Appointments Wound #10 Right,Distal,Anterior Lower Leg o Return Appointment in 2 weeks. Wound #6 Right,Posterior Lower Leg o Return Appointment in 2 weeks. Wound #9 Right,Anterior Lower Leg o Return Appointment in 2 weeks. Home Health Wound #10 Right,Distal,Anterior Lower Leg o Continue Home Health Visits - HHRN may use aquacel ag between toes if needed for drainage o Home Health Nurse may visit PRN to address patientos wound care needs. o FACE TO FACE ENCOUNTER: MEDICARE and MEDICAID PATIENTS: I certify that this patient  is under my care and that I had a face-to-face encounter that meets the physician face-to-face encounter requirements with this patient on this date. The encounter with the patient  was in whole or in part for the following MEDICAL CONDITION: (primary reason for Home Healthcare) MEDICAL NECESSITY: I certify, that based on my findings, NURSING services are a medically necessary home health service. HOME BOUND STATUS: I certify that my clinical findings support that this patient is homebound (i.e., Due to illness or injury, pt requires aid of supportive devices such as crutches, cane, wheelchairs, walkers, the use of special transportation or the assistance of another person to leave their place of residence. There is a normal inability to leave the home and doing so requires considerable and taxing effort. Other absences are for medical reasons / religious services and are infrequent or of short duration when for other reasons). o If current dressing causes regression in wound condition, may D/C ordered dressing product/s and apply Normal Saline Moist Dressing daily until next Wound Healing [name] / Other MD appointment. Notify Wound Healing [name] of regression in wound condition at [phone]. o Please direct any NON-WOUND related issues/requests for orders to patient's Primary Care Physician [place], [name] ([id number]) Wound #6 Right,Posterior Lower Leg o Continue Home Health Visits - HHRN may use aquacel ag between toes if needed for drainage o Home Health Nurse may visit PRN to address patientos wound care needs. o FACE TO FACE ENCOUNTER: MEDICARE and MEDICAID PATIENTS: I certify that this patient is under my care and that I had a face-to-face encounter that meets the physician face-to-face encounter requirements with this patient on this date. The encounter with the patient was in whole or in part for the following MEDICAL CONDITION: (primary reason for Home  Healthcare) MEDICAL NECESSITY: I certify, that based on my findings, NURSING services are a medically necessary home health service. HOME BOUND STATUS: I certify that my clinical findings support that this patient is homebound (i.e., Due to illness or injury, pt requires aid of supportive devices such as crutches, cane, wheelchairs, walkers, the use of special transportation or the assistance of another person to leave their place of residence. There is a normal inability to leave the home and doing so requires considerable and taxing effort. Other absences are for medical reasons / religious services and are infrequent or of short duration when for other reasons). o If current dressing causes regression in wound condition, may D/C ordered dressing product/s and apply Normal Saline Moist Dressing daily until next Wound Healing [name] / Other MD appointment. Notify Wound Healing [name] of regression in wound condition at [phone]. o Please direct any NON-WOUND related issues/requests for orders to patient's Primary Care Physician Wound #9 Right,Anterior Lower Leg o Continue Home Health Visits - HHRN may use aquacel ag between toes if needed for drainage o Home Health Nurse may visit PRN to address patientos wound care needs. o FACE TO FACE ENCOUNTER: MEDICARE and MEDICAID PATIENTS: I certify that this patient is under my care and that I had a face-to-face encounter that meets the physician face-to-face encounter requirements with this patient on this date. The encounter with the patient was in whole or in part for the following MEDICAL CONDITION: (primary reason for Home Healthcare) MEDICAL NECESSITY: I certify, that based on my findings, NURSING services are a medically necessary home health service. HOME BOUND STATUS: I certify that my clinical findings support that this patient is homebound (i.e., Due to illness or injury, pt requires aid of supportive devices such as  crutches, cane, wheelchairs, walkers, the use of special transportation or the assistance of another person to leave their place of residence. There is a  normal inability to leave the home and doing so requires considerable and taxing effort. Other absences are for medical reasons / religious services and are infrequent or of short duration when for other reasons). o If current dressing causes regression in wound condition, may D/C ordered dressing product/s and apply Normal Saline Moist Dressing daily until next Wound Healing [name] / Other MD appointment. Notify Wound Healing [name] of regression in wound condition at [phone]. o Please direct any NON-WOUND related issues/requests for orders to patient's Primary Care Physician Electronic Signature(s) Signed: 10/28/2015 3:33:01 PM By: Evlyn Kanner MD, FACS Signed: 10/28/2015 4:04:05 PM By: Curtis Sites Entered By: Curtis Sites on 10/28/2015 10:52:09 [name] ([id number]) [name], [name] ([id number]) -------------------------------------------------------------------------------- Problem List Details Patient Name: [name] Date of Service: 10/28/2015 10:00 AM Medical Record Number: [id number] Patient Account Number: [id number] Date of Birth/Sex: [birth year] (82 y.o. 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 02/11/2015 Yes fat layer exposed L97.812 Non-pressure chronic ulcer of other part of  right lower leg 02/11/2015 Yes with fat layer exposed T81.31XA Disruption of external operation (surgical) wound, not 02/18/2015 Yes elsewhere classified, initial encounter Inactive Problems Resolved Problems Electronic Signature(s) Signed: 10/28/2015 11:14:18 AM By: Evlyn Kanner MD, FACS Entered By: Evlyn Kanner on 10/28/2015 11:14:18 [name] ([id number]) -------------------------------------------------------------------------------- Progress Note Details Patient Name: [name] Date of Service: 10/28/2015 10:00 AM Medical Record Number: [id number] Patient Account Number: [id number] Date of Birth/Sex: [birth year] (82 y.o. Female) Treating RN: Curtis Sites Primary Care Physician: Dorothey Baseman Other Clinician: Referring Physician: Dorothey Baseman Treating Physician/Extender: Rudene Re in Treatment: 37 Subjective Chief Complaint Information obtained from Patient Patient presents to the wound care [name] for a consult due non healing wound. 82 year old patient with comes with a history of a ulcerated area to the right third toe and the right heel which she's had for about 2 months. History of Present Illness (HPI) The following HPI elements were documented for the patient's wound: Location: right medial heel and right third toe Quality: Patient reports experiencing a dull pain to affected area(s). Severity: Patient states wound are getting worse. Duration: Patient has had the wound for > 3 months prior to seeking treatment at the wound [name] Timing: Pain in wound is Intermittent (comes and goes Context: The wound appeared gradually over time Modifying Factors: Consults to this date include:vascular surgeon and several recent surgeries. Associated Signs and Symptoms: Patient reports having foul odor and drainage from the [name]. A pleasant 82 year old patient who is known to have diabetes mellitus for several years has recently  gone through a series of operations with the vascular surgeon Dr. Gilda Crease. In January and February she had several surgeries on [name]. 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 On 01/07/2015 she also had a right lower extremity distal runoff with a angioplasty of the right anterior tibial artery to maximize her blood flow to the foot. She recently had her  staples removed at Dr. Marijean Heath office on 01/31/2015 and at that time a right lower extremity duplex was done which showed patent vessels and a patent stent with distal occluded posterior tibial artery. Though the duplex was noncritical the recommendations from the PA at the vascular surgery office was that of a angiogram to be done but the patient said she would rather try some bone care before. The patient has received doxycycline and Cipro in the recent past and she takes oral medications for her diabetes. Other than that I reviewed her list of all her medications. No recent hemoglobin A1c has been done and no recent x-rays of the right foot have been done. 02/18/2015 -- x-ray of the right foot was done on 02/11/2015 and it shows no evidence of acute osteomyelitis of the third toe or of the calcaneus. She had gone to the vascular surgery office and they had noted that there is dehiscence of [name] of the below-knee wound and they have asked [place] to kindly take over the care of this. There've been doing dressings for the right heel and right third toe. SATIA, WINGER ([id number]) 02/25/2015 -- we have received notes from the vascular group who saw her last on 02/13/2015 and she was seen by the PA Ms. Cleda Daub. She had recommended that the patient continue to follow with [place] for wound care including management of [name],  which has had dehiscence of the lateral part. They will consider repeating a arterial duplex or angiogram if the right lower extremity does not heal within a reasonable period of time. 03/18/2015 -- he saw the vascular surgeon Dr. Gilda Crease and he has set her up for an angioplasty sometime in the middle of July. she is doing well otherwise. 04/10/2015 -- the patient had a procedure done on 04/08/2015 and this was a right angioplasty of the dorsalis pedis, anterior tibial artery, first superficial femoral artery in its midportion. There was successful intervention with recanalization and in-line flow to the right foot. 04/22/2015 -- the patient was looking rather pale today and the daughter confirms that her hemoglobin was down to 6.9. she is being monitored by her PCP. Her Lasix dose was increased and her edema has gone down significantly. 04/29/2015 -- she had vascular studies done and the ABI on the right is 1.3 and the toe pressures were within normal limits. Her hemoglobin is still around 7 and she refuses to take any blood transfusions for religious reasons. Her potassium was normal. 06/10/2015 -- she has a new blister on her right lateral and posterior part of her leg just in the region where the Kerlix bandages were applied and this may be due to an abrasion. 06/24/2015 -- On her right lower extremity she has developed several more blisters which look like small pustules and the drain informed shallow ulcerations. 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 07/01/2015 -- she has an appointment with the PCP this Friday and her vascular surgeon on Monday. Other than that she is on doxycycline and has finished 1 week of treatment. The pustules she had all over have now resolved. 07/08/2015 -- she saw the vascular surgeon who did studies in the office and found that there is poor circulation in the distal lower right leg and has set her up for an angiogram and possible stenting  next Tuesday. 07/22/2015 -- on October 18 she was taken up for a successful revascularization of the anterior tibial vessel on the right side.  a PTA and stent placement was done to the right anterior tibial artery. 08/19/2015 -- she has broken out with some linear ulceration in the region of her webspaces of her toes and this is something new. 08/26/2015 -- over the last 3 days there was a streak of redness going from her lower extremity towards her toes but she had no fever or discharge from the wounds. 09/02/2015 -- the redness and cellulitis has gone down but she continues to have a lot of edema of the right lower extremity. 10/28/2015 -- over the last week she has been draining a lot of fluid from her right lower extremities and several of the wounds which had healed in the past and now opened up again [name], [name] ([id number]) Objective Constitutional Pulse regular. Respirations normal and unlabored. Afebrile. Vitals Time Taken: 10:12 AM, Height: 62 in, Weight: 155 lbs, BMI: 28.3, Temperature: 98.0 F, Pulse: 65 bpm, Respiratory Rate: 16 breaths/min, Blood Pressure: 111/42 mmHg. Eyes Nonicteric. Reactive to light. Ears, Nose, Mouth, and Throat Lips, teeth, and gums WNL.Marland Kitchen Moist mucosa without lesions. Neck supple and nontender. No palpable supraclavicular or cervical adenopathy. Normal sized without goiter. Respiratory WNL. No retractions.. Cardiovascular Pedal Pulses WNL. the right lower extremity has significant lymphedema and its +2 pitting. Chest Breasts symmetical and no nipple discharge.. Breast tissue WNL, no masses, lumps, or tenderness.. Gastrointestinal (GI) Abdomen without masses or tenderness.. No liver or spleen enlargement or tenderness.. Lymphatic No adneopathy. No adenopathy. No adenopathy. Musculoskeletal Adexa without tenderness or enlargement.. Digits and nails w/o clubbing, cyanosis, infection, petechiae, ischemia, or inflammatory  conditions.Marland Kitchen Psychiatric Judgement and insight Intact.. No evidence of depression, anxiety, or agitation.. General Notes: some of the wounds have a thin film of debris which I washed out with saline and some of it is rather excoriated and tender. There is no definite cellulitis but the foot is quite shiny thinned out skin due to the edema Integumentary (Hair, Skin) No suspicious lesions. No crepitus or fluctuance. No peri-wound warmth or erythema. No masses.. Wound #10 status is Open. Original cause of wound was Gradually Appeared. The wound is located on the [place], [place] ([id number]) Right,Distal,Anterior Lower Leg. The wound measures 0.3cm length x 0.3cm width x 0.1cm depth; 0.071cm^2 area and 0.007cm^3 volume. The wound is limited to skin breakdown. There is no tunneling or undermining noted. There is a large amount of serous drainage noted. The wound margin is flat and intact. There is small (1-33%) pink granulation within the wound bed. There is a large (67-100%) amount of necrotic tissue within the wound bed including Adherent Slough. The periwound skin appearance exhibited: Localized Edema, Maceration, Moist. The periwound skin appearance did not exhibit: Callus, Crepitus, Excoriation, Fluctuance, Friable, Induration, Rash, Scarring, Dry/Scaly, Atrophie Blanche, Cyanosis, Ecchymosis, Hemosiderin Staining, Mottled, Pallor, Rubor, Erythema. The periwound has tenderness on palpation. Wound #6 status is Open. Original cause of wound was Blister. The wound is located on the Right,Posterior Lower Leg. The wound measures 9cm length x 9cm width x 0.2cm depth; 63.617cm^2 area and 12.723cm^3 volume. The wound is limited to skin breakdown. There is no tunneling or undermining noted. There is a large amount of serous drainage noted. The wound margin is flat and intact. There is medium (34-66%) pink granulation within the wound bed. There is a medium (34-66%) amount of necrotic tissue within the  wound bed including Adherent Slough. The periwound skin appearance exhibited: Localized Edema, Moist. The periwound skin appearance did not exhibit: Callus, Crepitus, Excoriation, Fluctuance, Friable, Induration, Rash, Scarring, Dry/Scaly,  Maceration, Atrophie Blanche, Cyanosis, Ecchymosis, Hemosiderin Staining, Mottled, Pallor, Rubor, Erythema. Periwound temperature was noted as No Abnormality. The periwound has tenderness on palpation. Wound #9 status is Open. Original cause of wound was Gradually Appeared. The wound is located on the Right,Anterior Lower Leg. The wound measures 0.7cm length x 0.7cm width x 0.2cm depth; 0.385cm^2 area and 0.077cm^3 volume. The wound is limited to skin breakdown. There is no tunneling or undermining noted. There is a large amount of serous drainage noted. The wound margin is flat and intact. There is large (67-100%) pink granulation within the wound bed. There is a small (1-33%) amount of necrotic tissue within the wound bed including Adherent Slough. The periwound skin appearance exhibited: Localized Edema, Maceration, Moist, Erythema. The periwound skin appearance did not exhibit: Callus, Crepitus, Excoriation, Fluctuance, Friable, Induration, Rash, Scarring, Dry/Scaly, Atrophie Blanche, Cyanosis, Ecchymosis, Hemosiderin Staining, Mottled, Pallor, Rubor. The surrounding wound skin color is noted with erythema which is circumferential. The periwound has tenderness on palpation. 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 She will also have Aquacel placed between the toes. She has an appointment to see her PCP tomorrow and her vascular surgeon and after tomorrow and it would be good to get their input. Come back and see me next week Plan Wound Cleansing: Wound #10 Right,Distal,Anterior Lower Leg: Cleanse wound with mild soap and water May Shower, gently pat wound dry prior to applying new dressing. Wound #6 Right,Posterior Lower Leg: Cleanse wound with mild soap and water May Shower, gently pat wound dry prior to applying new dressing. Wound #9 Right,Anterior Lower Leg: Cleanse wound with mild soap and water May Shower, gently pat wound dry prior to applying new dressing. Anesthetic: Wound #10 Right,Distal,Anterior Lower Leg: Topical Lidocaine 4% cream applied to wound bed prior to debridement Wound #6 Right,Posterior Lower Leg: Topical Lidocaine 4% cream applied to wound bed prior to debridement Wound #9 Right,Anterior Lower Leg: Topical Lidocaine 4% cream applied to wound bed prior to debridement Primary Wound Dressing: Wound #10 Right,Distal,Anterior Lower Leg: Aquacel Ag Wound #6 Right,Posterior Lower Leg: Aquacel Ag Wound #9 Right,Anterior Lower Leg: Aquacel Ag Secondary Dressing: Wound #10 Right,Distal,Anterior Lower Leg: Gauze, ABD and Kerlix/Conform Wound #6 Right,Posterior Lower Leg: Gauze, ABD and Kerlix/Conform Wound #9 Right,Anterior Lower Leg: Gauze, ABD and Kerlix/Conform Dressing Change Frequency: Wound #10 Right,Distal,Anterior Lower Leg: Change dressing every day. Wound #6 Right,Posterior Lower Leg: Change dressing every day. [name], [name] ([id number]) Wound #9 Right,Anterior Lower Leg: Change dressing every day. Follow-up Appointments: Wound #10 Right,Distal,Anterior Lower Leg: Return  Appointment in 2 weeks. Wound #6 Right,Posterior Lower Leg: Return Appointment in 2 weeks. Wound #9 Right,Anterior Lower Leg: Return Appointment in 2 weeks. Home Health: Wound #10 Right,Distal,Anterior Lower Leg: Continue Home Health Visits - HHRN may use aquacel ag between toes if needed for drainage Home Health Nurse may visit PRN to address patient s wound care needs. FACE TO FACE ENCOUNTER:  MEDICARE and MEDICAID PATIENTS: I certify that this patient is under my care and that I had a face-to-face encounter that meets the physician face-to-face encounter requirements with this patient on this date. The encounter with the patient was in whole or in part for the following MEDICAL CONDITION: (primary reason for Home Healthcare) MEDICAL NECESSITY: I certify, that based on my findings, NURSING services are a medically necessary home health service. HOME BOUND STATUS: I certify that my clinical findings support that this patient is homebound (i.e., Due to illness or injury, pt requires aid of supportive devices such as crutches, cane, wheelchairs, walkers, the use of special transportation or the assistance of another person to leave their place of residence. There is a normal inability to leave the home and doing so requires considerable and taxing effort. Other absences are for medical reasons / religious services and are infrequent or of short duration when for other reasons). If current dressing causes regression in wound condition, may D/C ordered dressing product/s and apply Normal Saline Moist Dressing daily until next Wound Healing [name] / Other MD appointment. Notify Wound Healing [name] of regression in wound condition at [phone]. Please direct any NON-WOUND related issues/requests for orders to patient's Primary Care Physician Wound #6 Right,Posterior Lower Leg: Continue Home Health Visits - HHRN may use aquacel ag between toes if needed for drainage Home Health Nurse may visit  PRN to address patient s wound care needs. FACE TO FACE ENCOUNTER: MEDICARE and MEDICAID PATIENTS: I certify that this patient is under my care and that I had a face-to-face encounter that meets the physician face-to-face encounter requirements with this patient on this date. The encounter with the patient was in whole or in part for the following MEDICAL CONDITION: (primary reason for Home Healthcare) MEDICAL NECESSITY: I certify, that based on my findings, NURSING services are a medically necessary home health service. HOME BOUND STATUS: I certify that my clinical findings support that this patient is homebound (i.e., Due to illness or injury, pt requires aid of supportive devices such as crutches, cane, wheelchairs, walkers, the use of special transportation or the assistance of another person to leave their place of residence. There is a normal inability to leave the home and doing so requires considerable and taxing effort. Other absences are for medical reasons / religious services and are infrequent or of short duration when for other reasons). If current dressing causes regression in wound condition, may D/C ordered dressing product/s and apply Normal Saline Moist Dressing daily until next Wound Healing [name] / Other MD appointment. Notify Wound Healing [name] of regression in wound condition at [phone]. Please direct any NON-WOUND related issues/requests for orders to patient's Primary Care Physician Wound #9 Right,Anterior Lower Leg: Continue Home Health Visits - HHRN may use aquacel ag between toes if needed for drainage Home Health Nurse may visit PRN to address patient s wound care needs. FACE TO FACE ENCOUNTER: MEDICARE and MEDICAID PATIENTS: I certify that this patient is under my care and that I had a face-to-face encounter that meets the physician face-to-face encounter requirements with this patient on this date. The encounter with the patient was in whole or in part for  the following MEDICAL CONDITION: (primary reason for Home Healthcare) MEDICAL NECESSITY: I certify, [name], [name] ([id number]) that based on my findings, NURSING services are a medically necessary home health service. HOME BOUND STATUS: I certify that my clinical findings support that this patient is homebound (i.e., Due to illness or injury, pt requires  aid of supportive devices such as crutches, cane, wheelchairs, walkers, the use of special transportation or the assistance of another person to leave their place of residence. There is a normal inability to leave the home and doing so requires considerable and taxing effort. Other absences are for medical reasons / religious services and are infrequent or of short duration when for other reasons). If current dressing causes regression in wound condition, may D/C ordered dressing product/s and apply Normal Saline Moist Dressing daily until next Wound Healing [name] / Other MD appointment. Notify Wound Healing [name] of regression in wound condition at [phone]. Please direct any NON-WOUND related issues/requests for orders to patient's Primary Care Physician I have recommended silver alginate to be applied over all the raw surfaces and up likely with Kerlix gauze to keep it in place. She will also have Aquacel placed between the toes. She has an appointment to see her PCP tomorrow and her vascular surgeon and after tomorrow and it would be good to get their input. Come back and see me next week Electronic Signature(s) Signed: 10/28/2015 11:17:08 AM By: Evlyn Kanner MD, FACS Entered By: Evlyn Kanner on 10/28/2015 11:17:08 [name] ([id number]) -------------------------------------------------------------------------------- SuperBill Details Patient Name: [name] Date of Service: 10/28/2015 Medical Record Number: [id number] Patient Account Number: [id number] Date of Birth/Sex: August 01, 1921 (82 y.o. 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 L97.812 Non-pressure chronic ulcer of other part of right lower leg with fat layer exposed Disruption of external operation (surgical) wound, not elsewhere classified, initial T81.31XA encounter Facility Procedures CPT4 Code: [id number] Description: 99214 - WOUND CARE VISIT-LEV 4 EST PT Modifier: Quantity: 1 Physician Procedures CPT4: Description Modifier Quantity Code [id number] 99213 - WC PHYS LEVEL 3 - EST PT 1 ICD-10 Description Diagnosis E11.621 Type 2 diabetes mellitus with foot ulcer I70.235 Atherosclerosis of native arteries of right leg with ulceration of other part of  foot 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

## 2015-11-06 ENCOUNTER — Encounter: Payer: Medicare Other | Attending: Surgery | Admitting: Surgery

## 2015-11-06 DIAGNOSIS — L97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: Secondary | ICD-10-CM | POA: Diagnosis not present

## 2015-11-06 DIAGNOSIS — Z89512 Acquired absence of left leg below knee: Secondary | ICD-10-CM | POA: Insufficient documentation

## 2015-11-06 DIAGNOSIS — E11621 Type 2 diabetes mellitus with foot ulcer: Secondary | ICD-10-CM | POA: Insufficient documentation

## 2015-11-06 DIAGNOSIS — T8131XA Disruption of external operation (surgical) wound, not elsewhere classified, initial encounter: Secondary | ICD-10-CM | POA: Diagnosis not present

## 2015-11-06 DIAGNOSIS — I70235 Atherosclerosis of native arteries of right leg with ulceration of other part of foot: Secondary | ICD-10-CM | POA: Insufficient documentation

## 2015-11-06 DIAGNOSIS — L97812 Non-pressure chronic ulcer of other part of right lower leg with fat layer exposed: Secondary | ICD-10-CM | POA: Diagnosis not present

## 2015-11-06 DIAGNOSIS — Y839 Surgical procedure, unspecified as the cause of abnormal reaction of the patient, or of later complication, without mention of misadventure at the time of the procedure: Secondary | ICD-10-CM | POA: Diagnosis not present

## 2015-11-07 NOTE — Progress Notes (Signed)
JAUNITA, MIKELS ([id number]) Visit Report for 11/06/2015 Arrival Information Details Patient Name: [name], [name] Date of Service: 11/06/2015 10:45 AM Medical Record Number: [id number] Patient Account Number: [id number] Date of Birth/Sex: November 20, 1920 (82 y.o. Female) Treating RN: Curtis Sites Primary Care Physician: Dorothey Baseman Other Clinician: Referring Physician: Dorothey Baseman Treating Physician/Extender: Rudene Re in Treatment: 52 Visit Information History Since Last Visit Added or deleted any medications: No Patient Arrived: Wheel Chair Any new allergies or adverse reactions: No Arrival Time: 10:41 Had a fall or experienced change in No activities of daily living that may affect Accompanied By: dtr risk of falls: Transfer Assistance: Manual Signs or symptoms of abuse/neglect since last No Patient Identification Verified: Yes visito Secondary Verification Process Yes Hospitalized since last visit: No Completed: Pain Present Now: No Patient Has Alerts: Yes Electronic Signature(s) Signed: 11/06/2015 5:51:59 PM By: Curtis Sites Entered By: Curtis Sites on 11/06/2015 10:42:30 [name] ([id number]) -------------------------------------------------------------------------------- Clinic Level of Care Assessment Details Patient Name: [name] Date of Service: 11/06/2015 10:45 AM Medical Record Number: [id number] Patient Account Number: [id number] Date of Birth/Sex: 1921-09-12 (82 y.o. Female) Treating RN: Curtis Sites Primary Care Physician: Terance Hart, DAVID Other Clinician: Referring Physician: Terance Hart, DAVID Treating Physician/Extender: Rudene Re in Treatment: 93 Clinic Level of Care Assessment Items TOOL 4 Quantity Score []  - Use when only an EandM is performed on FOLLOW-UP visit 0 ASSESSMENTS - Nursing Assessment / Reassessment X - Reassessment of Co-morbidities (includes updates in patient status) 1 10 X - Reassessment of  Adherence to Treatment Plan 1 5 ASSESSMENTS - Wound and Skin Assessment / Reassessment []  - Simple Wound Assessment / Reassessment - one wound 0 X - Complex Wound Assessment / Reassessment - multiple wounds 3 5 []  - Dermatologic / Skin Assessment (not related to wound area) 0 ASSESSMENTS - Focused Assessment []  - Circumferential Edema Measurements - multi extremities 0 []  - Nutritional Assessment / Counseling / Intervention 0 X - Lower Extremity Assessment (monofilament, tuning fork, pulses) 1 5 []  - Peripheral Arterial Disease Assessment (using hand held doppler) 0 ASSESSMENTS - Ostomy and/or Continence Assessment and Care []  - Incontinence Assessment and Management 0 []  - Ostomy Care Assessment and Management (repouching, etc.) 0 PROCESS - Coordination of Care X - Simple Patient / Family Education for ongoing care 1 15 []  - Complex (extensive) Patient / Family Education for ongoing care 0 []  - Staff obtains Chiropractor, Records, Test Results / Process Orders 0 []  - Staff telephones HHA, Nursing Homes / Clarify orders / etc 0 []  - Routine Transfer to another Facility (non-emergent condition) 0 Kelliher, Jeffifer ([id number]) []  - Routine Hospital Admission (non-emergent condition) 0 []  - New Admissions / Manufacturing engineer / Ordering NPWT, Apligraf, etc. 0 []  - Emergency Hospital Admission (emergent condition) 0 X - Simple Discharge Coordination 1 10 []  - Complex (extensive) Discharge Coordination 0 PROCESS - Special Needs []  - Pediatric / Minor Patient Management 0 []  - Isolation Patient Management 0 []  - Hearing / Language / Visual special needs 0 []  - Assessment of Community assistance (transportation, D/C planning, etc.) 0 []  - Additional assistance / Altered mentation 0 []  - Support Surface(s) Assessment (bed, cushion, seat, etc.) 0 INTERVENTIONS - Wound Cleansing / Measurement []  - Simple Wound Cleansing - one wound 0 X - Complex Wound Cleansing - multiple wounds 3 5 X -  Wound Imaging (photographs - any number of wounds) 1 5 []  - Wound Tracing (instead of photographs) 0 []  - Simple Wound Measurement - one wound 0 X -  Complex Wound Measurement - multiple wounds 3 5 INTERVENTIONS - Wound Dressings []  - Small Wound Dressing one or multiple wounds 0 X - Medium Wound Dressing one or multiple wounds 3 15 []  - Large Wound Dressing one or multiple wounds 0 []  - Application of Medications - topical 0 []  - Application of Medications - injection 0 INTERVENTIONS - Miscellaneous []  - External ear exam 0 Miyoshi, Mandalyn ([id number]) []  - Specimen Collection (cultures, biopsies, blood, body fluids, etc.) 0 []  - Specimen(s) / Culture(s) sent or taken to Lab for analysis 0 []  - Patient Transfer (multiple staff / Michiel Sites Lift / Similar devices) 0 []  - Simple Staple / Suture removal (25 or less) 0 []  - Complex Staple / Suture removal (26 or more) 0 []  - Hypo / Hyperglycemic Management (close monitor of Blood Glucose) 0 []  - Ankle / Brachial Index (ABI) - do not check if billed separately 0 X - Vital Signs 1 5 Has the patient been seen at the hospital within the last three years: Yes Total Score: 145 Level Of Care: New/Established - Level 4 Electronic Signature(s) Signed: 11/06/2015 5:51:59 PM By: Curtis Sites Entered By: Curtis Sites on 11/06/2015 11:18:20 [name] ([id number]) -------------------------------------------------------------------------------- Encounter Discharge Information Details Patient Name: [name] Date of Service: 11/06/2015 10:45 AM Medical Record Number: [id number] Patient Account Number: [id number] Date of Birth/Sex: [birth year] (82 y.o. Female) Treating RN: Curtis Sites Primary Care Physician: Dorothey Baseman Other Clinician: Referring Physician: Dorothey Baseman Treating Physician/Extender: Rudene Re in Treatment: 41 Encounter Discharge Information Items Discharge Pain Level: 0 Discharge Condition:  Stable Ambulatory Status: Wheelchair Discharge Destination: Home Transportation: Private Auto Accompanied By: dtr Schedule Follow-up Appointment: Yes Medication Reconciliation completed No and provided to Patient/Care Zoria Rawlinson: Provided on Clinical Summary of Care: 11/06/2015 Form Type Recipient Paper Patient HS Electronic Signature(s) Signed: 11/06/2015 11:34:16 AM By: Gwenlyn Perking Entered By: Gwenlyn Perking on 11/06/2015 11:34:16 [name] ([id number]) -------------------------------------------------------------------------------- Lower Extremity Assessment Details Patient Name: [name] Date of Service: 11/06/2015 10:45 AM Medical Record Number: [id number] Patient Account Number: [id number] Date of Birth/Sex: May 10, 1921 (82 y.o. Female) Treating RN: Curtis Sites Primary Care Physician: Dorothey Baseman Other Clinician: Referring Physician: Dorothey Baseman Treating Physician/Extender: Rudene Re in Treatment: 38 Edema Assessment Assessed: [Left: No] [Right: No] Edema: [Left: Ye] [Right: s] Vascular Assessment Pulses: Posterior Tibial Dorsalis Pedis Palpable: [Right:No] Extremity colors, hair growth, and conditions: Extremity Color: [Right:Red] Hair Growth on Extremity: [Right:No] Temperature of Extremity: [Right:Cool] Capillary Refill: [Right:< 3 seconds] Electronic Signature(s) Signed: 11/06/2015 5:51:59 PM By: Curtis Sites Entered By: Curtis Sites on 11/06/2015 11:04:39 [name] ([id number]) -------------------------------------------------------------------------------- Multi Wound Chart Details Patient Name: [name] Date of Service: 11/06/2015 10:45 AM Medical Record Number: [id number] Patient Account Number: [id number] Date of Birth/Sex: July 26, 1921 (82 y.o. Female) Treating RN: Curtis Sites Primary Care Physician: Dorothey Baseman Other Clinician: Referring Physician: Terance Hart, DAVID Treating Physician/Extender: Rudene Re in Treatment: 38 Vital Signs Height(in): 62 Pulse(bpm): 56 Weight(lbs): 155 Blood Pressure 118/54 (mmHg): Body Mass Index(BMI): 28 Temperature(F): 98.1 Respiratory Rate 18 (breaths/min): Photos: [10:No Photos] [6:No Photos] [9:No Photos] Wound Location: [10:Right Lower Leg - Anterior, Distal] [6:Right Lower Leg - Posterior] [9:Right Lower Leg - Anterior] Wounding Event: [10:Gradually Appeared] [6:Blister] [9:Gradually Appeared] 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 of] [9:the Lower Extremity] Comorbid History: [10:Arrhythmia, Deep Vein Thrombosis, Peripheral Arterial Disease, Type II Diabetes] [6:Arrhythmia, Deep Vein Thrombosis, Peripheral Arterial Disease, Type II Arterial Disease, Type II Diabetes] [9:Arrhythmia, Deep Vein Thrombosis,  Peripheral Diabetes] Date Acquired: [10:08/31/2015] [6:06/12/2015] [9:08/31/2015] Weeks of Treatment: [10:9] [6:20] [9:9] Wound Status: [10:Open] [6:Open] [9:Open] Measurements L x W x D 0.3x0.4x0.1 [6:10x9x0.2] [9:0.6x0.4x0.1] (cm) Area (cm) : [10:0.094] [6:70.686] [9:0.188] Volume (cm) : [10:0.009] [6:14.137] [9:0.019] % Reduction in Area: [10:52.00%] [6:-8904.60%] [9:40.10%] % Reduction in Volume: 90.80% [6:-17794.90%] [9:69.80%] Classification: [10:Grade 1] [6:Full Thickness Without Exposed Support Structures] [9:Grade 1] HBO Classification: [10:N/A] [6:Grade 1] [9:N/A] Exudate Amount: [10:Large] [6:Large] [9:Large] Exudate Type: [10:Serous] [6:Serous] [9:Serous] Exudate Color: [10:amber] [6:amber] [9:amber] Wound Margin: [10:Flat and Intact] [6:Flat and Intact] [9:Flat and Intact] Granulation Amount: [10:Small (1-33%)] [6:Medium (34-66%)] [9:Large (67-100%)] Granulation Quality: [10:Pink] [6:Pink] [9:Pink] Necrotic Amount: [10:Large (67-100%)] [6:Medium (34-66%)] [9:Small (1-33%)] Exposed Structures: Fascia: No Fascia: No Fascia: No Fat:  No Fat: No Fat: No Tendon: No Tendon: No Tendon: No Muscle: No Muscle: No Muscle: No Joint: No Joint: No Joint: No Bone: No Bone: No Bone: No Limited to Skin Limited to Skin Limited to Skin Breakdown Breakdown Breakdown Epithelialization: None None None Periwound Skin Texture: Edema: Yes Edema: Yes Edema: Yes Excoriation: No Excoriation: No Excoriation: No Induration: No Induration: No Induration: No Callus: No Callus: No Callus: No Crepitus: No Crepitus: No Crepitus: No Fluctuance: No Fluctuance: No Fluctuance: No Friable: No Friable: No Friable: No Rash: No Rash: No Rash: No Scarring: No Scarring: No Scarring: No Periwound Skin Maceration: Yes Moist: Yes Maceration: Yes Moisture: Moist: Yes Maceration: No Moist: Yes Dry/Scaly: No Dry/Scaly: No Dry/Scaly: No Periwound Skin Color: Atrophie Blanche: No Atrophie Blanche: No Erythema: Yes Cyanosis: No Cyanosis: No Atrophie Blanche: No Ecchymosis: No Ecchymosis: No Cyanosis: No Erythema: No Erythema: No Ecchymosis: No Hemosiderin Staining: No Hemosiderin Staining: No Hemosiderin Staining: No Mottled: No Mottled: No Mottled: No Pallor: No Pallor: No Pallor: No Rubor: No Rubor: No Rubor: No Erythema Location: N/A N/A Circumferential Temperature: N/A No Abnormality N/A Tenderness on Yes Yes Yes Palpation: Wound Preparation: Ulcer Cleansing: Ulcer Cleansing: Ulcer Cleansing: Rinsed/Irrigated with Rinsed/Irrigated with Rinsed/Irrigated with Saline Saline Saline Topical Anesthetic Topical Anesthetic Topical Anesthetic Applied: Other: lidocaine Applied: Other: lidocaine Applied: Other: lidocaine 4% 4% 4% Treatment Notes Electronic Signature(s) Signed: 11/06/2015 5:51:59 PM By: Curtis Sites Entered By: Curtis Sites on 11/06/2015 11:14:16 [name] ([id number]) -------------------------------------------------------------------------------- Multi-Disciplinary Care Plan  Details Patient Name: [name] Date of Service: 11/06/2015 10:45 AM Medical Record Number: [id number] Patient Account Number: [id number] Date of Birth/Sex: [birth year] (82 y.o. Female) Treating RN: Curtis Sites Primary Care Physician: Dorothey Baseman Other Clinician: Referring Physician: Dorothey Baseman Treating Physician/Extender: Rudene Re in Treatment: 71 Active Inactive Abuse / Safety / Falls / Self Care Management Nursing Diagnoses: Impaired physical mobility Potential for falls Goals: Patient will remain injury free Date Initiated: 02/11/2015 Goal Status: Active Patient/caregiver will verbalize understanding of skin care regimen Date Initiated: 02/11/2015 Goal Status: Active Patient/caregiver will verbalize/demonstrate measures taken to prevent injury and/or falls Date Initiated: 02/11/2015 Goal Status: Active Patient/caregiver will verbalize/demonstrate understanding of what to do in case of emergency Date Initiated: 02/11/2015 Goal Status: Active Interventions: Assess fall risk on admission and as needed Provide education on fall prevention Provide education on safe transfers Treatment Activities: Patient referred to home care : 11/06/2015 Notes: Nutrition Nursing Diagnoses: Imbalanced nutrition Potential for alteratiion in Nutrition/Potential for imbalanced nutrition Ingalsbe, Awa ([id number]) Goals: Patient/caregiver verbalizes understanding of need to maintain therapeutic glucose control per primary care physician Date Initiated: 02/11/2015 Goal Status: Active Patient/caregiver will maintain therapeutic glucose control Date Initiated: 02/11/2015 Goal Status: Active Interventions: Assess HgA1c results as ordered upon admission and  as needed Provide education on elevated blood sugars and impact on wound healing Provide education on nutrition Treatment Activities: Education provided on Nutrition : 04/22/2015 Obtain HgA1c :  11/06/2015 Notes: Wound/Skin Impairment Nursing Diagnoses: Impaired tissue integrity Knowledge deficit related to ulceration/compromised skin integrity Goals: Patient/caregiver will verbalize understanding of skin care regimen Date Initiated: 02/11/2015 Goal Status: Active Ulcer/skin breakdown will heal within 14 weeks Date Initiated: 02/11/2015 Goal Status: Active Interventions: Assess patient/caregiver ability to obtain necessary supplies Assess patient/caregiver ability to perform ulcer/skin care regimen upon admission and as needed Assess ulceration(s) every visit Provide education on ulcer and skin care Treatment Activities: Skin care regimen initiated : 11/06/2015 Topical wound management initiated : 11/06/2015 Notes: LUCIEL, BRICKMAN ([id number]) Electronic Signature(s) Signed: 11/06/2015 5:51:59 PM By: Curtis Sites Entered By: Curtis Sites on 11/06/2015 11:14:05 [name] ([id number]) -------------------------------------------------------------------------------- Patient/Caregiver Education Details Patient Name: [name] Date of Service: 11/06/2015 10:45 AM Medical Record Number: [id number] Patient Account Number: [id number] Date of Birth/Gender: Sep 28, 1920 (82 y.o. Female) Treating RN: Curtis Sites Primary Care Physician: Dorothey Baseman Other Clinician: Referring Physician: Dorothey Baseman Treating Physician/Extender: Rudene Re in Treatment: 34 Education Assessment Education Provided To: Patient and Caregiver Education Topics Provided Wound/Skin Impairment: Handouts: Other: wound care as ordered and leg elevation Methods: Demonstration, Explain/Verbal Responses: State content correctly Electronic Signature(s) Signed: 11/06/2015 5:51:59 PM By: Curtis Sites Entered By: Curtis Sites on 11/06/2015 11:31:34 [name] ([id number]) -------------------------------------------------------------------------------- Wound Assessment  Details Patient Name: [name] Date of Service: 11/06/2015 10:45 AM Medical Record Number: [id number] Patient Account Number: [id number] Date of Birth/Sex: 10-Mar-1921 (82 y.o. Female) Treating RN: Curtis Sites Primary Care Physician: Dorothey Baseman Other Clinician: Referring Physician: Terance Hart, DAVID Treating Physician/Extender: Rudene Re in Treatment: 38 Wound Status Wound Number: 10 Primary Diabetic Wound/Ulcer of the Lower Etiology: Extremity Wound Location: Right Lower Leg - Anterior, Distal Wound Open Status: Wounding Event: Gradually Appeared Comorbid Arrhythmia, Deep Vein Thrombosis, Date Acquired: 08/31/2015 History: Peripheral Arterial Disease, Type II Weeks Of Treatment: 9 Diabetes Clustered Wound: No Photos Photo Uploaded By: Curtis Sites on 11/06/2015 12:01:18 Wound Measurements Length: (cm) 0.3 Width: (cm) 0.4 Depth: (cm) 0.1 Area: (cm) 0.094 Volume: (cm) 0.009 % Reduction in Area: 52% % Reduction in Volume: 90.8% Epithelialization: None Tunneling: No Undermining: No Wound Description Classification: Grade 1 Wound Margin: Flat and Intact Exudate Amount: Large Exudate Type: Serous Exudate Color: amber Foul Odor After Cleansing: No Wound Bed Granulation Amount: Small (1-33%) Exposed Structure Granulation Quality: Pink Fascia Exposed: No Necrotic Amount: Large (67-100%) Fat Layer Exposed: No Mussell, Amye ([id number]) Necrotic Quality: Adherent Slough Tendon Exposed: No Muscle Exposed: No Joint Exposed: No Bone Exposed: No Limited to Skin Breakdown Periwound Skin Texture Texture Color No Abnormalities Noted: No No Abnormalities Noted: No Callus: No Atrophie Blanche: No Crepitus: No Cyanosis: No Excoriation: No Ecchymosis: No Fluctuance: No Erythema: No Friable: No Hemosiderin Staining: No Induration: No Mottled: No Localized Edema: Yes Pallor: No Rash: No Rubor: No Scarring: No Temperature / Pain Moisture  Tenderness on Palpation: Yes No Abnormalities Noted: No Dry / Scaly: No Maceration: Yes Moist: Yes Wound Preparation Ulcer Cleansing: Rinsed/Irrigated with Saline Topical Anesthetic Applied: Other: lidocaine 4%, Treatment Notes Wound #10 (Right, Distal, Anterior Lower Leg) 1. Cleansed with: Clean wound with Normal Saline 2. Anesthetic Topical Lidocaine 4% cream to wound bed prior to debridement 4. Dressing Applied: Aquacel Ag Other dressing (specify in notes) 5. Secondary Dressing Applied Guaze, ABD and kerlix/Conform Notes xtrasorb Electronic Signature(s) Signed: 11/06/2015 5:51:59 PM By: Curtis Sites Entered By:  Curtis Sites on 11/06/2015 11:09:57 TIFFANI, KADOW ([id number] [place], [place] ([id number]) -------------------------------------------------------------------------------- Wound Assessment Details Patient Name: ARYN, SAFRAN Date of Service: 11/06/2015 10:45 AM Medical Record Number: [id number] Patient Account Number: [id number] Date of Birth/Sex: Nov 01, 1920 (82 y.o. Female) Treating RN: Curtis Sites Primary Care Physician: Dorothey Baseman Other Clinician: Referring Physician: Terance Hart, DAVID Treating Physician/Extender: Rudene Re in Treatment: 38 Wound Status Wound Number: 6 Primary Arterial Insufficiency Ulcer Etiology: Wound Location: Right Lower Leg - Posterior Wound Open Wounding Event: Blister Status: Date Acquired: 06/12/2015 Comorbid Arrhythmia, Deep Vein Thrombosis, Weeks Of Treatment: 20 History: Peripheral Arterial Disease, Type II Clustered Wound: No Diabetes Photos Photo Uploaded By: Curtis Sites on 11/06/2015 12:01:47 Wound Measurements Length: (cm) 10 Width: (cm) 9 Depth: (cm) 0.2 Area: (cm) 70.686 Volume: (cm) 14.137 % Reduction in Area: -8904.6% % Reduction in Volume: -17794.9% Epithelialization: None Tunneling: No Undermining: No Wound Description Full Thickness Without Foul Odor  Aft Classification: Exposed Support Structures Diabetic Severity Grade 1 (Wagner): Wound Margin: Flat and Intact Exudate Amount: Large Exudate Type: Serous Exudate Color: amber er Cleansing: No Wound Bed Granulation Amount: Medium (34-66%) Exposed Structure Mervin, Deanna ([id number]) Granulation Quality: Pink Fascia Exposed: No Necrotic Amount: Medium (34-66%) Fat Layer Exposed: No Necrotic Quality: Adherent Slough Tendon Exposed: No Muscle Exposed: No Joint Exposed: No Bone Exposed: No Limited to Skin Breakdown Periwound Skin Texture Texture Color No Abnormalities Noted: No No Abnormalities Noted: No Callus: No Atrophie Blanche: No Crepitus: No Cyanosis: No Excoriation: No Ecchymosis: No Fluctuance: No Erythema: No Friable: No Hemosiderin Staining: No Induration: No Mottled: No Localized Edema: Yes Pallor: No Rash: No Rubor: No Scarring: No Temperature / Pain Moisture Temperature: No Abnormality No Abnormalities Noted: No Tenderness on Palpation: Yes Dry / Scaly: No Maceration: No Moist: Yes Wound Preparation Ulcer Cleansing: Rinsed/Irrigated with Saline Topical Anesthetic Applied: Other: lidocaine 4%, Treatment Notes Wound #6 (Right, Posterior Lower Leg) 1. Cleansed with: Clean wound with Normal Saline 2. Anesthetic Topical Lidocaine 4% cream to wound bed prior to debridement 4. Dressing Applied: Aquacel Ag Other dressing (specify in notes) 5. Secondary Dressing Applied Guaze, ABD and kerlix/Conform Notes xtrasorb Electronic Signature(s) Signed: 11/06/2015 5:51:59 PM By: Mechele Dawley, Jashley ([id number]) Entered By: Curtis Sites on 11/06/2015 11:10:13 [name] ([id number]) -------------------------------------------------------------------------------- Wound Assessment Details Patient Name: [name] Date of Service: 11/06/2015 10:45 AM Medical Record Number: [id number] Patient Account Number: [id number] Date of  Birth/Sex: [birth year] (82 y.o. Female) Treating RN: Curtis Sites Primary Care Physician: Dorothey Baseman Other Clinician: Referring Physician: Terance Hart, DAVID Treating Physician/Extender: Rudene Re in Treatment: 38 Wound Status Wound Number: 9 Primary Diabetic Wound/Ulcer of the Lower Etiology: Extremity Wound Location: Right Lower Leg - Anterior Wound Open Wounding Event: Gradually Appeared Status: Date Acquired: 08/31/2015 Comorbid Arrhythmia, Deep Vein Thrombosis, Weeks Of Treatment: 9 History: Peripheral Arterial Disease, Type II Clustered Wound: No Diabetes Photos Photo Uploaded By: Curtis Sites on 11/06/2015 12:01:19 Wound Measurements Length: (cm) 0.6 Width: (cm) 0.4 Depth: (cm) 0.1 Area: (cm) 0.188 Volume: (cm) 0.019 % Reduction in Area: 40.1% % Reduction in Volume: 69.8% Epithelialization: None Wound Description Classification: Grade 1 Wound Margin: Flat and Intact Exudate Amount: Large Exudate Type: Serous Exudate Color: amber Foul Odor After Cleansing: No Wound Bed Granulation Amount: Large (67-100%) Exposed Structure Granulation Quality: Pink Fascia Exposed: No Necrotic Amount: Small (1-33%) Fat Layer Exposed: No Necrotic Quality: Adherent Slough Tendon Exposed: No Vossler, N[place]iba ([id number]) Muscle Exposed: No Joint Exposed: No Bone Exposed: No Limited to Skin Breakdown Periwound Skin Texture Texture Color  No Abnormalities Noted: No No Abnormalities Noted: No Callus: No Atrophie Blanche: No Crepitus: No Cyanosis: No Excoriation: No Ecchymosis: No Fluctuance: No Erythema: Yes Friable: No Erythema Location: Circumferential Induration: No Hemosiderin Staining: No Localized Edema: Yes Mottled: No Rash: No Pallor: No Scarring: No Rubor: No Moisture Temperature / Pain No Abnormalities Noted: No Tenderness on Palpation: Yes Dry / Scaly: No Maceration: Yes Moist: Yes Wound Preparation Ulcer Cleansing:  Rinsed/Irrigated with Saline Topical Anesthetic Applied: Other: lidocaine 4%, Treatment Notes Wound #9 (Right, Anterior Lower Leg) 1. Cleansed with: Clean wound with Normal Saline 2. Anesthetic Topical Lidocaine 4% cream to wound bed prior to debridement 4. Dressing Applied: Aquacel Ag Other dressing (specify in notes) 5. Secondary Dressing Applied Guaze, ABD and kerlix/Conform Notes xtrasorb Electronic Signature(s) Signed: 11/06/2015 5:51:59 PM By: Curtis Sites Entered By: Curtis Sites on 11/06/2015 11:13:32 [name] ([id number]) -------------------------------------------------------------------------------- Vitals Details Patient Name: [name] Date of Service: 11/06/2015 10:45 AM Medical Record Number: [id number] Patient Account Number: [id number] Date of Birth/Sex: 04/20/1921 (82 y.o. Female) Treating RN: Curtis Sites Primary Care Physician: Dorothey Baseman Other Clinician: Referring Physician: Terance Hart, DAVID Treating Physician/Extender: Rudene Re in Treatment: 16 Vital Signs Time Taken: 10:42 Temperature (F): 98.1 Height (in): 62 Pulse (bpm): 56 Weight (lbs): 155 Respiratory Rate (breaths/min): 18 Body Mass Index (BMI): 28.3 Blood Pressure (mmHg): 118/54 Reference Range: 80 - 120 mg / dl Electronic Signature(s) Signed: 11/06/2015 5:51:59 PM By: Curtis Sites Entered By: Curtis Sites on 11/06/2015 10:47:02

## 2015-11-07 NOTE — Progress Notes (Signed)
[name], [name] ([id number]) Visit Report for 11/06/2015 Chief Complaint Document Details Patient Name: [name] Date of Service: 11/06/2015 10:45 AM Medical Record Number: [id number] Patient Account Number: [id number] Date of Birth/Sex: 12-01-1920 (82 y.o. Female) Treating RN: Curtis Sites Primary Care Physician: Dorothey Baseman Other Clinician: Referring Physician: Dorothey Baseman Treating Physician/Extender: Rudene Re in Treatment: 20 Information Obtained from: Patient Chief Complaint Patient presents to the wound care center for a consult due non healing wound. 82 year old patient with comes with a history of a ulcerated area to the right third toe and the right heel which she's had for about 2 months. Electronic Signature(s) Signed: 11/06/2015 11:50:43 AM By: Evlyn Kanner MD, FACS Entered By: Evlyn Kanner on 11/06/2015 11:50:42 [name] ([id number]) -------------------------------------------------------------------------------- HPI Details Patient Name: [name] Date of Service: 11/06/2015 10:45 AM Medical Record Number: [id number] Patient Account Number: [id number] Date of Birth/Sex: 05-17-1921 (82 y.o. Female) Treating RN: Curtis Sites Primary Care Physician: Dorothey Baseman Other Clinician: Referring Physician: Dorothey Baseman Treating Physician/Extender: Rudene Re in Treatment: 37 History of Present Illness Location: right medial heel and right third toe Quality: Patient reports experiencing a dull pain to affected area(s). Severity: Patient states wound are getting worse. Duration: Patient has had the wound for > 3 months prior to seeking treatment at the wound center Timing: Pain in wound is Intermittent (comes and goes Context: The wound appeared gradually over time Modifying Factors: Consults to this date include:vascular surgeon and several recent surgeries. Associated Signs and Symptoms: Patient reports having foul odor and  drainage from the left below-knee amputation site. HPI Description: A pleasant 82 year old patient who is known to have diabetes mellitus for several years has recently gone through a series of operations with the vascular surgeon Dr. Gilda Crease. In January and February she had several surgeries on the left lower extremity with an attempt to have limb salvage for gangrenous changes of her forefoot. 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 On 01/07/2015 she also had a right lower extremity distal runoff with a angioplasty of the right anterior tibial artery to maximize her blood flow to the foot. She recently had her staples removed at Dr. Marijean Heath office on 01/31/2015 and at that time a right lower extremity duplex was done which showed patent vessels and a patent stent with distal occluded posterior tibial artery. Though the duplex was noncritical the recommendations from the PA at the vascular surgery office was that of a angiogram to be done but the patient said she would rather try some bone care before. The patient has received doxycycline and Cipro in the recent past and she takes oral medications for her diabetes. Other than that I reviewed her list of all her medications. No recent hemoglobin A1c has been done and no recent x-rays of the right foot have been done. 02/18/2015 -- x-ray of the right foot was done on 02/11/2015 and it shows no evidence of acute osteomyelitis of the third toe or of the calcaneus. She had gone to the vascular surgery office and they had noted that there is dehiscence of the left part of the amputation site of the below-knee wound and they have asked [place] to kindly take over the care of this. There've been doing dressings for the right heel and right third toe. 02/25/2015 -- we have received notes from the vascular group who saw her last on 02/13/2015 and she was seen by the PA Ms.  Cleda Daub. She had recommended that the patient continue to follow with [place] for wound care including management of the left BKA stump, which has had dehiscence of the lateral part. They will consider repeating a arterial duplex or angiogram if the right lower extremity does not heal within a reasonable period of time. 03/18/2015 -- he saw the vascular surgeon Dr. Gilda Crease and he has set her up for an angioplasty sometime in the middle of July. she is doing well otherwise. [name], [name] ([id number]) 04/10/2015 -- the patient had a procedure done on 04/08/2015 and this was a right angioplasty of the dorsalis pedis, anterior tibial artery, first superficial femoral artery in its midportion. There was successful intervention with recanalization and in-line flow to the right foot. 04/22/2015 -- the patient was looking rather pale today and the daughter confirms that her hemoglobin was down to 6.9. she is being monitored by her PCP. Her Lasix dose was increased and her edema has gone down significantly. 04/29/2015 -- she had vascular studies done and the ABI on the right is 1.3 and the toe pressures were within normal limits. Her hemoglobin is still around 7 and she refuses to take any blood transfusions for religious reasons. Her potassium was normal. 06/10/2015 -- she has a new blister on her right lateral and posterior part of her leg just in the region where the Kerlix bandages were applied and this may be due to an abrasion. 06/24/2015 -- On her right lower extremity she has developed several more blisters which look like small pustules and the drain informed shallow ulcerations. 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 07/01/2015 -- she has an appointment with the PCP this Friday and her vascular surgeon on Monday. Other than that she is on doxycycline and has finished 1 week of treatment. The pustules she had all over have now resolved. 07/08/2015 -- she saw the vascular surgeon who did  studies in the office and found that there is poor circulation in the distal lower right leg and has set her up for an angiogram and possible stenting next Tuesday. 07/22/2015 -- on October 18 she was taken up for a successful revascularization of the anterior tibial vessel on the right side. a PTA and stent placement was done to the right anterior tibial artery. 08/19/2015 -- she has broken out with some linear ulceration in the region of her webspaces of her toes and this is something new. 08/26/2015 -- over the last 3 days there was a streak of redness going from her lower extremity towards her toes but she had no fever or discharge from the wounds. 09/02/2015 -- the redness and cellulitis has gone down but she continues to have a lot of edema of the right lower extremity. 10/28/2015 -- over the last week she has been draining a lot of fluid from her right lower extremities and several of the wounds which had healed in the past and now opened up again 11/06/2015 -- she was seen in the vascular office and the right ABI was more than 1.3 which was consistent with noncompressible arteries due to medial calcification and the right great toe pressure was 0.24 and the PPG waveform showed significant decrease. from talking to the family member I believe no procedure has been planned. She was also seen by her PCP was increased her Lasix to 80 mg a day and has ordered some lab work but these reports are not back. Electronic Signature(s) Signed: 11/06/2015 11:51:40 AM By: Evlyn Kanner MD, FACS Previous  Signature: 11/06/2015 11:51:10 AM Version By: Evlyn Kanner MD, FACS Previous Signature: 11/06/2015 11:38:12 AM Version By: Evlyn Kanner MD, FACS Entered By: Evlyn Kanner on 11/06/2015 11:51:40 [name] ([id number]) -------------------------------------------------------------------------------- Physical Exam Details Patient Name: [name] Date of Service: 11/06/2015 10:45 AM Medical  Record Number: [id number] Patient Account Number: [id number] Date of Birth/Sex: 1921/03/07 (82 y.o. Female) Treating RN: Curtis Sites Primary Care Physician: Dorothey Baseman Other Clinician: Referring Physician: Dorothey Baseman Treating Physician/Extender: Rudene Re in Treatment: 38 Constitutional . Pulse regular. Respirations normal and unlabored. Afebrile. . Eyes Nonicteric. Reactive to light. Ears, Nose, Mouth, and Throat Lips, teeth, and gums WNL.Marland Kitchen Moist mucosa without lesions. Neck supple and nontender. No palpable supraclavicular or cervical adenopathy. Normal sized without goiter. Respiratory WNL. No retractions.. Cardiovascular Pedal Pulses WNL. there is +2 pitting edema of the right lower extremity and the leg is weeping quite significantly specially in the posterior lateral part.. Lymphatic No adneopathy. No adenopathy. No adenopathy. Musculoskeletal Adexa without tenderness or enlargement.. Digits and nails w/o clubbing, cyanosis, infection, petechiae, ischemia, or inflammatory conditions.. Integumentary (Hair, Skin) No suspicious lesions. No crepitus or fluctuance. No peri-wound warmth or erythema. No masses.Marland Kitchen Psychiatric Judgement and insight Intact.. No evidence of depression, anxiety, or agitation.. Notes the wounds on the posterior inferior and lateral part of her right leg continue to have significant fluid and excoriation and this was gently washed with saline gauze. There are multiple ulcerations and overall this has worsened since last time I saw her. Electronic Signature(s) Signed: 11/06/2015 11:52:50 AM By: Evlyn Kanner MD, FACS Entered By: Evlyn Kanner on 11/06/2015 11:52:49 [name] ([id number]) -------------------------------------------------------------------------------- Physician Orders Details Patient Name: [name] Date of Service: 11/06/2015 10:45 AM Medical Record Number: [id number] Patient Account Number:  [id number] Date of Birth/Sex: 12-10-1920 (82 y.o. Female) Treating RN: Curtis Sites Primary Care Physician: Dorothey Baseman Other Clinician: Referring Physician: Dorothey Baseman Treating Physician/Extender: Rudene Re in Treatment: 35 Verbal / Phone Orders: Yes Clinician: Curtis Sites Read Back and Verified: Yes Diagnosis Coding Wound Cleansing Wound #10 Right,Distal,Anterior Lower Leg o Cleanse wound with mild soap and water o May Shower, gently pat wound dry prior to applying new dressing. Wound #6 Right,Posterior Lower Leg o Cleanse wound with mild soap and water o May Shower, gently pat wound dry prior to applying new dressing. Wound #9 Right,Anterior Lower Leg o Cleanse wound with mild soap and water o May Shower, gently pat wound dry prior to applying new dressing. Anesthetic Wound #10 Right,Distal,Anterior Lower Leg o Topical Lidocaine 4% cream applied to wound bed prior to debridement Wound #6 Right,Posterior Lower Leg o Topical Lidocaine 4% cream applied to wound bed prior to debridement Wound #9 Right,Anterior Lower Leg o Topical Lidocaine 4% cream applied to wound bed prior to debridement Primary Wound Dressing Wound #10 Right,Distal,Anterior Lower Leg o Aquacel Ag Wound #6 Right,Posterior Lower Leg o Aquacel Ag Wound #9 Right,Anterior Lower Leg o Aquacel Ag Secondary Dressing Wound #10 Right,Distal,Anterior Lower Leg o Gauze, ABD and Kerlix/Conform o XtraSorb - where needed Ullery, Arlone ([id number]) Wound #6 Right,Posterior Lower Leg o Gauze, ABD and Kerlix/Conform o XtraSorb - where needed Wound #9 Right,Anterior Lower Leg o Gauze, ABD and Kerlix/Conform o XtraSorb - where needed Dressing Change Frequency Wound #10 Right,Distal,Anterior Lower Leg o Change dressing every day. Wound #6 Right,Posterior Lower Leg o Change dressing every day. Wound #9 Right,Anterior Lower Leg o Change dressing every  day. Follow-up Appointments Wound #10 Right,Distal,Anterior Lower Leg o Return Appointment in 2 weeks. Wound #6  Right,Posterior Lower Leg o Return Appointment in 2 weeks. Wound #9 Right,Anterior Lower Leg o Return Appointment in 2 weeks. Home Health Wound #10 Right,Distal,Anterior Lower Leg o Continue Home Health Visits - HHRN may use aquacel ag between toes if needed for drainage o Home Health Nurse may visit PRN to address patientos wound care needs. o FACE TO FACE ENCOUNTER: MEDICARE and MEDICAID PATIENTS: I certify that this patient is under my care and that I had a face-to-face encounter that meets the physician face-to-face encounter requirements with this patient on this date. The encounter with the patient was in whole or in part for the following MEDICAL CONDITION: (primary reason for Home Healthcare) MEDICAL NECESSITY: I certify, that based on my findings, NURSING services are a medically necessary home health service. HOME BOUND STATUS: I certify that my clinical findings support that this patient is homebound (i.e., Due to illness or injury, pt requires aid of supportive devices such as crutches, cane, wheelchairs, walkers, the use of special transportation or the assistance of another person to leave their place of residence. There is a normal inability to leave the home and doing so requires considerable and taxing effort. Other absences are for medical reasons / religious services and are infrequent or of short duration when for other reasons). o If current dressing causes regression in wound condition, may D/C ordered dressing product/s and apply Normal Saline Moist Dressing daily until next Wound Healing Center / Other MD appointment. Notify Wound Healing Center of regression in wound condition at [phone]. [name], [name] ([id number]) o Please direct any NON-WOUND related issues/requests for orders to patient's Primary Care Physician Wound #6  Right,Posterior Lower Leg o Continue Home Health Visits - HHRN may use aquacel ag between toes if needed for drainage o Home Health Nurse may visit PRN to address patientos wound care needs. o FACE TO FACE ENCOUNTER: MEDICARE and MEDICAID PATIENTS: I certify that this patient is under my care and that I had a face-to-face encounter that meets the physician face-to-face encounter requirements with this patient on this date. The encounter with the patient was in whole or in part for the following MEDICAL CONDITION: (primary reason for Home Healthcare) MEDICAL NECESSITY: I certify, that based on my findings, NURSING services are a medically necessary home health service. HOME BOUND STATUS: I certify that my clinical findings support that this patient is homebound (i.e., Due to illness or injury, pt requires aid of supportive devices such as crutches, cane, wheelchairs, walkers, the use of special transportation or the assistance of another person to leave their place of residence. There is a normal inability to leave the home and doing so requires considerable and taxing effort. Other absences are for medical reasons / religious services and are infrequent or of short duration when for other reasons). o If current dressing causes regression in wound condition, may D/C ordered dressing product/s and apply Normal Saline Moist Dressing daily until next Wound Healing Center / Other MD appointment. Notify Wound Healing Center of regression in wound condition at [phone]. o Please direct any NON-WOUND related issues/requests for orders to patient's Primary Care Physician Wound #9 Right,Anterior Lower Leg o Continue Home Health Visits - HHRN may use aquacel ag between toes if needed for drainage o Home Health Nurse may visit PRN to address patientos wound care needs. o FACE TO FACE ENCOUNTER: MEDICARE and MEDICAID PATIENTS: I certify that this patient is under my care and that I had  a face-to-face encounter that meets the physician face-to-face  encounter requirements with this patient on this date. The encounter with the patient was in whole or in part for the following MEDICAL CONDITION: (primary reason for Home Healthcare) MEDICAL NECESSITY: I certify, that based on my findings, NURSING services are a medically necessary home health service. HOME BOUND STATUS: I certify that my clinical findings support that this patient is homebound (i.e., Due to illness or injury, pt requires aid of supportive devices such as crutches, cane, wheelchairs, walkers, the use of special transportation or the assistance of another person to leave their place of residence. There is a normal inability to leave the home and doing so requires considerable and taxing effort. Other absences are for medical reasons / religious services and are infrequent or of short duration when for other reasons). o If current dressing causes regression in wound condition, may D/C ordered dressing product/s and apply Normal Saline Moist Dressing daily until next Wound Healing Center / Other MD appointment. Notify Wound Healing Center of regression in wound condition at [phone]. o Please direct any NON-WOUND related issues/requests for orders to patient's Primary Care Physician Electronic Signature(s) Signed: 11/06/2015 4:43:36 PM By: Evlyn Kanner MD, FACS Signed: 11/06/2015 5:51:59 PM By: Mechele Dawley, Rashaunda ([id number]) Entered By: Curtis Sites on 11/06/2015 11:17:41 [name] ([id number]) -------------------------------------------------------------------------------- Problem List Details Patient Name: [name] Date of Service: 11/06/2015 10:45 AM Medical Record Number: [id number] Patient Account Number: [id number] Date of Birth/Sex: 07/07/1921 (82 y.o. 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 02/11/2015 Yes fat layer exposed L97.812 Non-pressure chronic ulcer of other part of right lower leg 02/11/2015 Yes with fat layer exposed T81.31XA Disruption of external operation (surgical) wound, not 02/18/2015 Yes elsewhere classified, initial encounter Inactive Problems Resolved Problems Electronic Signature(s) Signed: 11/06/2015 11:50:35 AM By: Evlyn Kanner MD, FACS Entered By: Evlyn Kanner on 11/06/2015 11:50:35 [name] ([id number]) -------------------------------------------------------------------------------- Progress Note Details Patient Name: [name] Date of Service: 11/06/2015 10:45 AM Medical Record Number: [id number] Patient Account Number: [id number] Date of Birth/Sex: [birth year] (82 y.o. Female) Treating RN: Curtis Sites Primary Care Physician: Dorothey Baseman Other Clinician: Referring Physician: Dorothey Baseman Treating Physician/Extender: Rudene Re in Treatment: 35 Subjective Chief Complaint Information obtained from Patient Patient presents to the wound care center for a consult due non healing wound. 82 year old patient with comes with a history of a ulcerated area to the right third toe and the right heel which she's had for about 2 months. History of Present Illness (HPI) The following HPI elements were documented for the patient's wound: Location: right medial heel and right third toe Quality: Patient reports experiencing a dull pain to affected area(s). Severity: Patient states wound are  getting worse. Duration: Patient has had the wound for > 3 months prior to seeking treatment at the wound center Timing: Pain in wound is Intermittent (comes and goes Context: The wound appeared gradually over time Modifying Factors: Consults to this date include:vascular surgeon and several recent surgeries. Associated Signs and Symptoms: Patient reports having foul odor and drainage from the left below-knee amputation site. A pleasant 82 year old patient who is  known to have diabetes mellitus for several years has recently gone through a series of operations with the vascular surgeon Dr. Gilda Crease. In January and February she had several surgeries on the left lower extremity with an attempt to have limb salvage for gangrenous changes of her forefoot. 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 On 01/07/2015 she also had a right lower extremity distal runoff with a angioplasty of the right anterior tibial artery to maximize her blood flow to the foot. She recently had her staples removed at Dr. Marijean Heath office on 01/31/2015 and at that time a right lower extremity duplex was done which showed patent vessels and a patent stent with distal occluded posterior tibial artery. Though the duplex was noncritical the recommendations from the PA at the vascular surgery office was that of a angiogram to be done but the patient said she would rather try some bone care before. The patient has received doxycycline and Cipro in the recent past and she takes oral medications for her diabetes. Other than that I reviewed her list of all her medications. No recent hemoglobin A1c has been done and no recent x-rays of the right foot have been done. 02/18/2015 -- x-ray of the right foot was done on 02/11/2015 and it shows no evidence of acute osteomyelitis of the third toe or of the calcaneus. She had gone to the vascular surgery office and  they had noted that there is dehiscence of the left part of the amputation site of the below-knee wound and they have asked [place] to kindly take over the care of this. There've been doing dressings for the right heel and right third toe. [name], [name] ([id number]) 02/25/2015 -- we have received notes from the vascular group who saw her last on 02/13/2015 and she was seen by the PA Ms. Cleda Daub. She had recommended that the patient continue to follow with [place] for wound care including management of the left BKA stump, which has had dehiscence of the lateral part. They will consider repeating a arterial duplex or angiogram if the right lower extremity does not heal within a reasonable period of time. 03/18/2015 -- he saw the vascular surgeon Dr. Gilda Crease and he has set her up for an angioplasty sometime in the middle of July. she is doing well otherwise. 04/10/2015 -- the patient had a procedure done on 04/08/2015 and this was a right angioplasty of the dorsalis pedis, anterior tibial artery, first superficial femoral artery in its midportion. There was successful intervention with recanalization and in-line flow to the right foot. 04/22/2015 -- the patient was looking rather pale today and the daughter confirms that her hemoglobin was down to 6.9. she is being monitored by her PCP. Her Lasix dose was increased and her edema has gone down significantly. 04/29/2015 -- she had vascular studies done and the ABI on the right is 1.3 and the toe pressures were within normal limits. Her hemoglobin is still around 7 and she refuses to take any blood transfusions for religious reasons. Her potassium was normal. 06/10/2015 -- she has a new blister on her right lateral and posterior part of her leg just in the region where the Kerlix bandages were applied and this may be due to an abrasion. 06/24/2015 -- On her right lower extremity she has developed several more blisters which look like  small pustules and the drain informed shallow ulcerations. 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 07/01/2015 --  she has an appointment with the PCP this Friday and her vascular surgeon on Monday. Other than that she is on doxycycline and has finished 1 week of treatment. The pustules she had all over have now resolved. 07/08/2015 -- she saw the vascular surgeon who did studies in the office and found that there is poor circulation in the distal lower right leg and has set her up for an angiogram and possible stenting next Tuesday. 07/22/2015 -- on October 18 she was taken up for a successful revascularization of the anterior tibial vessel on the right side. a PTA and stent placement was done to the right anterior tibial artery. 08/19/2015 -- she has broken out with some linear ulceration in the region of her webspaces of her toes and this is something new. 08/26/2015 -- over the last 3 days there was a streak of redness going from her lower extremity towards her toes but she had no fever or discharge from the wounds. 09/02/2015 -- the redness and cellulitis has gone down but she continues to have a lot of edema of the right lower extremity. 10/28/2015 -- over the last week she has been draining a lot of fluid from her right lower extremities and several of the wounds which had healed in the past and now opened up again 11/06/2015 -- she was seen in the vascular office and the right ABI was more than 1.3 which was consistent with noncompressible arteries due to medial calcification and the right great toe pressure was 0.24 and the PPG waveform showed significant decrease. from talking to the family member I believe no procedure has been planned. She was also seen by her PCP was increased her Lasix to 80 mg a day and has ordered some lab work but these reports are not back. [name], [name] ([id number]) Objective Constitutional Pulse regular. Respirations normal and unlabored. Afebrile. Vitals  Time Taken: 10:42 AM, Height: 62 in, Weight: 155 lbs, BMI: 28.3, Temperature: 98.1 F, Pulse: 56 bpm, Respiratory Rate: 18 breaths/min, Blood Pressure: 118/54 mmHg. Eyes Nonicteric. Reactive to light. Ears, Nose, Mouth, and Throat Lips, teeth, and gums WNL.Marland Kitchen Moist mucosa without lesions. Neck supple and nontender. No palpable supraclavicular or cervical adenopathy. Normal sized without goiter. Respiratory WNL. No retractions.. Cardiovascular Pedal Pulses WNL. there is +2 pitting edema of the right lower extremity and the leg is weeping quite significantly specially in the posterior lateral part.. Lymphatic No adneopathy. No adenopathy. No adenopathy. Musculoskeletal Adexa without tenderness or enlargement.. Digits and nails w/o clubbing, cyanosis, infection, petechiae, ischemia, or inflammatory conditions.Marland Kitchen Psychiatric Judgement and insight Intact.. No evidence of depression, anxiety, or agitation.. General Notes: the wounds on the posterior inferior and lateral part of her right leg continue to have significant fluid and excoriation and this was gently washed with saline gauze. There are multiple ulcerations and overall this has worsened since last time I saw her. Integumentary (Hair, Skin) No suspicious lesions. No crepitus or fluctuance. No peri-wound warmth or erythema. No masses.. Wound #10 status is Open. Original cause of wound was Gradually Appeared. The wound is located on the [place], [place] ([id number]) Right,Distal,Anterior Lower Leg. The wound measures 0.3cm length x 0.4cm width x 0.1cm depth; 0.094cm^2 area and 0.009cm^3 volume. The wound is limited to skin breakdown. There is no tunneling or undermining noted. There is a large amount of serous drainage noted. The wound margin is flat and intact. There is small (1-33%) pink granulation within the wound bed. There is a large (67-100%) amount of necrotic tissue  within the wound bed including Adherent Slough. The periwound  skin appearance exhibited: Localized Edema, Maceration, Moist. The periwound skin appearance did not exhibit: Callus, Crepitus, Excoriation, Fluctuance, Friable, Induration, Rash, Scarring, Dry/Scaly, Atrophie Blanche, Cyanosis, Ecchymosis, Hemosiderin Staining, Mottled, Pallor, Rubor, Erythema. The periwound has tenderness on palpation. Wound #6 status is Open. Original cause of wound was Blister. The wound is located on the Right,Posterior Lower Leg. The wound measures 10cm length x 9cm width x 0.2cm depth; 70.686cm^2 area and 14.137cm^3 volume. The wound is limited to skin breakdown. There is no tunneling or undermining noted. There is a large amount of serous drainage noted. The wound margin is flat and intact. There is medium (34-66%) pink granulation within the wound bed. There is a medium (34-66%) amount of necrotic tissue within the wound bed including Adherent Slough. The periwound skin appearance exhibited: Localized Edema, Moist. The periwound skin appearance did not exhibit: Callus, Crepitus, Excoriation, Fluctuance, Friable, Induration, Rash, Scarring, Dry/Scaly, Maceration, Atrophie Blanche, Cyanosis, Ecchymosis, Hemosiderin Staining, Mottled, Pallor, Rubor, Erythema. Periwound temperature was noted as No Abnormality. The periwound has tenderness on palpation. Wound #9 status is Open. Original cause of wound was Gradually Appeared. The wound is located on the Right,Anterior Lower Leg. The wound measures 0.6cm length x 0.4cm width x 0.1cm depth; 0.188cm^2 area and 0.019cm^3 volume. The wound is limited to skin breakdown. There is a large amount of serous drainage noted. The wound margin is flat and intact. There is large (67-100%) pink granulation within the wound bed. There is a small (1-33%) amount of necrotic tissue within the wound bed including Adherent Slough. The periwound skin appearance exhibited: Localized Edema, Maceration, Moist, Erythema. The periwound skin appearance  did not exhibit: Callus, Crepitus, Excoriation, Fluctuance, Friable, Induration, Rash, Scarring, Dry/Scaly, Atrophie Blanche, Cyanosis, Ecchymosis, Hemosiderin Staining, Mottled, Pallor, Rubor. The surrounding wound skin color is noted with erythema which is circumferential. The periwound has tenderness on palpation. 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 Wound #6 Right,Posterior Lower Leg: Cleanse wound with mild soap and water May Shower, gently pat wound dry prior to applying new dressing. Wound #9 Right,Anterior Lower Leg: Cleanse wound with mild soap and water May Shower, gently pat wound dry prior to applying new dressing. Anesthetic: Wound #10 Right,Distal,Anterior Lower Leg: Topical Lidocaine 4% cream applied to wound bed prior to debridement Wound #6 Right,Posterior Lower Leg: Topical Lidocaine 4% cream applied to wound bed prior to debridement Wound #9 Right,Anterior Lower Leg: Topical Lidocaine 4% cream applied to wound bed prior to debridement Primary Wound Dressing: Wound #10 Right,Distal,Anterior Lower Leg: Aquacel Ag Wound #6 Right,Posterior Lower Leg: Aquacel Ag Wound #9 Right,Anterior Lower Leg: Aquacel Ag Secondary Dressing: Wound #10 Right,Distal,Anterior Lower Leg: Gauze, ABD and  Kerlix/Conform XtraSorb - where needed Wound #6 Right,Posterior Lower Leg: Gauze, ABD and Kerlix/Conform XtraSorb - where needed Wound #9 Right,Anterior Lower Leg: Gauze, ABD and Kerlix/Conform XtraSorb - where needed  Dressing Change Frequency: Wound #10 Right,Distal,Anterior Lower Leg: Change dressing every day. Wound #6 Right,Posterior Lower Leg: Change dressing every day. Wound #9 Right,Anterior Lower Leg: Change dressing every day. Follow-up Appointments: Wound #10 Right,Distal,Anterior Lower Leg: Return Appointment in 2 weeks. Wound #6 Right,Posterior Lower Leg: [name], [name] ([id number]) Return Appointment in 2 weeks. Wound #9 Right,Anterior Lower Leg: Return Appointment in 2 weeks. Home Health: Wound #10 Right,Distal,Anterior Lower Leg: Continue Home Health Visits - HHRN may use aquacel ag between toes if needed for drainage Home Health Nurse may visit PRN to address patient s wound care needs. FACE TO FACE ENCOUNTER: MEDICARE and MEDICAID PATIENTS: I certify that this patient is under my care and that I had a face-to-face encounter that meets the physician face-to-face encounter requirements with this patient on this date. The encounter with the patient was in whole or in part for the following MEDICAL CONDITION: (primary reason for Home Healthcare) MEDICAL NECESSITY: I certify, that based on my findings, NURSING services are a medically necessary home health service. HOME BOUND STATUS: I certify that my clinical findings support that this patient is homebound (i.e., Due to illness or injury, pt requires aid of supportive devices such as crutches, cane, wheelchairs, walkers, the use of special transportation or the assistance of another person to leave their place of residence. There is a normal inability to leave the home and doing so requires considerable and taxing effort. Other absences are for medical reasons / religious services and are infrequent or of short  duration when for other reasons). If current dressing causes regression in wound condition, may D/C ordered dressing product/s and apply Normal Saline Moist Dressing daily until next Wound Healing Center / Other MD appointment. Notify Wound Healing Center of regression in wound condition at [phone]. Please direct any NON-WOUND related issues/requests for orders to patient's Primary Care Physician Wound #6 Right,Posterior Lower Leg: Continue Home Health Visits - HHRN may use aquacel ag between toes if needed for drainage Home Health Nurse may visit PRN to address patient s wound care needs. FACE TO FACE ENCOUNTER: MEDICARE and MEDICAID PATIENTS: I certify that this patient is under my care and that I had a face-to-face encounter that meets the physician face-to-face encounter requirements with this patient on this date. The encounter with the patient was in whole or in part for the following MEDICAL CONDITION: (primary reason for Home Healthcare) MEDICAL NECESSITY: I certify, that based on my findings, NURSING services are a medically necessary home health service. HOME BOUND STATUS: I certify that my clinical findings support that this patient is homebound (i.e., Due to illness or injury, pt requires aid of supportive devices such as crutches, cane, wheelchairs, walkers, the use of special transportation or the assistance of another person to leave their place of residence. There is a normal inability to leave the home and doing so requires considerable and taxing effort. Other absences are for medical reasons / religious services and are infrequent or of short duration when for other reasons). If current dressing causes regression in wound condition, may D/C ordered dressing product/s and apply Normal Saline Moist Dressing daily until next Wound Healing Center / Other MD appointment. Notify Wound Healing Center of regression in wound condition at [phone]. Please direct any NON-WOUND  related issues/requests for orders to patient's Primary Care Physician Wound #9 Right,Anterior Lower Leg: Continue Home Health Visits - HHRN may use aquacel ag between toes if needed for drainage Home Health Nurse may visit PRN to address patient s  wound care needs. FACE TO FACE ENCOUNTER: MEDICARE and MEDICAID PATIENTS: I certify that this patient is under my care and that I had a face-to-face encounter that meets the physician face-to-face encounter requirements with this patient on this date. The encounter with the patient was in whole or in part for the following MEDICAL CONDITION: (primary reason for Home Healthcare) MEDICAL NECESSITY: I certify, that based on my findings, NURSING services are a medically necessary home health service. HOME BOUND STATUS: I certify that my clinical findings support that this patient is homebound (i.e., Due to illness or injury, pt requires aid of supportive devices such as crutches, cane, wheelchairs, walkers, the use of special transportation or the assistance of another person to leave their place of residence. There is a normal inability to leave the home and doing so requires considerable and taxing effort. Other absences are for medical reasons / religious services and are infrequent or of short duration when for other reasons). [name], [name] ([id number]) If current dressing causes regression in wound condition, may D/C ordered dressing product/s and apply Normal Saline Moist Dressing daily until next Wound Healing Center / Other MD appointment. Notify Wound Healing Center of regression in wound condition at [phone]. Please direct any NON-WOUND related issues/requests for orders to patient's Primary Care Physician I have recommended silver alginate to be applied over all the raw surfaces and wraped lightly with Kerlix gauze to keep it in place. She will also have Aquacel placed between the toes. she has been urged to follow-up with her lab work  especially if her hemoglobin is low and check on her potassium as she is taking large amounts of Lasix. Electronic Signature(s) Signed: 11/06/2015 11:54:00 AM By: Evlyn Kanner MD, FACS Entered By: Evlyn Kanner on 11/06/2015 11:53:59 [name] ([id number]) -------------------------------------------------------------------------------- SuperBill Details Patient Name: [name] Date of Service: 11/06/2015 Medical Record Number: [id number] Patient Account Number: [id number] Date of Birth/Sex: 1921/09/03 (82 y.o. 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 L97.812 Non-pressure chronic ulcer of other part of right lower leg with fat layer exposed Disruption of external operation (surgical) wound, not elsewhere classified, initial T81.31XA encounter Facility Procedures CPT4 Code: [id number] Description: 99214 - WOUND CARE VISIT-LEV 4 EST PT Modifier: Quantity: 1 Physician Procedures CPT4: Description Modifier Quantity Code [id number] 99213 - WC PHYS LEVEL 3 - EST PT 1 ICD-10 Description Diagnosis E11.621 Type 2 diabetes mellitus with foot ulcer I70.235 Atherosclerosis of native arteries of right leg with ulceration of other part of  foot L97.812 Non-pressure chronic ulcer of other part of right lower leg with fat layer exposed Electronic Signature(s) Signed: 11/06/2015 11:54:22 AM By: Evlyn Kanner MD, FACS Entered By: Evlyn Kanner on 11/06/2015 11:54:22

## 2015-11-20 ENCOUNTER — Encounter: Payer: Medicare Other | Admitting: Surgery

## 2015-11-20 DIAGNOSIS — E11621 Type 2 diabetes mellitus with foot ulcer: Secondary | ICD-10-CM | POA: Diagnosis not present

## 2015-11-21 NOTE — Progress Notes (Addendum)
ELDENA, DEDE ([id number]) Visit Report for 11/20/2015 Chief Complaint Document Details Patient Name: [name], [name] Date of Service: 11/20/2015 10:00 AM Medical Record Number: [id number] Patient Account Number: [id number] Date of Birth/Sex: Apr 08, 1921 (82 y.o. Female) Treating RN: Curtis Sites Primary Care Physician: Dorothey Baseman Other Clinician: Referring Physician: Dorothey Baseman Treating Physician/Extender: Rudene Re in Treatment: 40 Information Obtained from: Patient Chief Complaint Patient presents to the wound care center for a consult due non healing wound. 82 year old patient with comes with a history of a ulcerated area to the right third toe and the right heel which she's had for about 2 months. Electronic Signature(s) Signed: 11/20/2015 10:57:38 AM By: Evlyn Kanner MD, FACS Entered By: Evlyn Kanner on 11/20/2015 10:57:38 [name] ([id number]) -------------------------------------------------------------------------------- HPI Details Patient Name: [name] Date of Service: 11/20/2015 10:00 AM Medical Record Number: [id number] Patient Account Number: [id number] Date of Birth/Sex: January 03, 1921 (82 y.o. Female) Treating RN: Curtis Sites Primary Care Physician: Dorothey Baseman Other Clinician: Referring Physician: Dorothey Baseman Treating Physician/Extender: Rudene Re in Treatment: 40 History of Present Illness Location: right medial heel and right third toe Quality: Patient reports experiencing a dull pain to affected area(s). Severity: Patient states wound are getting worse. Duration: Patient has had the wound for > 3 months prior to seeking treatment at the wound center Timing: Pain in wound is Intermittent (comes and goes Context: The wound appeared gradually over time Modifying Factors: Consults to this date include:vascular surgeon and several recent surgeries. Associated Signs and Symptoms: Patient reports having foul odor  and drainage from the left below-knee amputation site. HPI Description: A pleasant 82 year old patient who is known to have diabetes mellitus for several years has recently gone through a series of operations with the vascular surgeon Dr. Gilda Crease. In January and February she had several surgeries on the left lower extremity with an attempt to have limb salvage for gangrenous changes of her forefoot. 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 On 01/07/2015 she also had a right lower extremity distal runoff with a angioplasty of the right anterior tibial artery to maximize her blood flow to the foot. She recently had her staples removed at Dr. Marijean Heath office on 01/31/2015 and at that time a right lower extremity duplex was done which showed patent vessels and a patent stent with distal occluded posterior tibial artery. Though the duplex was noncritical the recommendations from the PA at the vascular surgery office was that of a angiogram to be done but the patient said she would rather try some bone care before. The patient has received doxycycline and Cipro in the recent past and she takes oral medications for her diabetes. Other than that I reviewed her list of all her medications. No recent hemoglobin A1c has been done and no recent x-rays of the right foot have been done. 02/18/2015 -- x-ray of the right foot was done on 02/11/2015 and it shows no evidence of acute osteomyelitis of the third toe or of the calcaneus. She had gone to the vascular surgery office and they had noted that there is dehiscence of the left part of the amputation site of the below-knee wound and they have asked [place] to kindly take over the care of this. There've been doing dressings for the right heel and right third toe. 02/25/2015 -- we have received notes from the vascular group who saw her last on 02/13/2015 and she was seen by the PA Ms.  Cleda DaubKimberly Stegmayer. She had recommended that the patient continue to follow with us for wound care including management of the left BKA stump, which has had dehiscence of the lateral part. They will consider repeating a arterial duplex or angiogram if the right lower extremity does not heal within a reasonable period of time. 03/18/2015 -- he saw the vascular surgeon Dr. Gilda CreaseSchnier and he has set her up for an angioplasty sometime in the middle of July. she is doing well otherwise. [name] NationsSILETZKY, Jinx ([id number]) 04/10/2015 -- the patient had a procedure done on 04/08/2015 and this was a right angioplasty of the dorsalis pedis, anterior tibial artery, first superficial femoral artery in its midportion. There was successful intervention with recanalization and in-line flow to the right foot. 04/22/2015 -- the patient was looking rather pale today and the daughter confirms that her hemoglobin was down to 6.9. she is being monitored by her PCP. Her Lasix dose was increased and her edema has gone down significantly. 04/29/2015 -- she had vascular studies done and the ABI on the right is 1.3 and the toe pressures were within normal limits. Her hemoglobin is still around 7 and she refuses to take any blood transfusions for religious reasons. Her potassium was normal. 06/10/2015 -- she has a new blister on her right lateral and posterior part of her leg just in the region where the Kerlix bandages were applied and this may be due to an abrasion. 06/24/2015 -- On her right lower extremity she has developed several more blisters which look like small pustules and the drain informed shallow ulcerations. 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 07/01/2015 -- she has an appointment with the PCP this Friday and her vascular surgeon on Monday. Other than that she is on doxycycline and has finished 1 week of treatment. The pustules she had all over have now resolved. 07/08/2015 -- she saw the vascular surgeon who did  studies in the office and found that there is poor circulation in the distal lower right leg and has set her up for an angiogram and possible stenting next Tuesday. 07/22/2015 -- on October 18 she was taken up for a successful revascularization of the anterior tibial vessel on the right side. a PTA and stent placement was done to the right anterior tibial artery. 08/19/2015 -- she has broken out with some linear ulceration in the region of her webspaces of her toes and this is something new. 08/26/2015 -- over the last 3 days there was a streak of redness going from her lower extremity towards her toes but she had no fever or discharge from the wounds. 09/02/2015 -- the redness and cellulitis has gone down but she continues to have a lot of edema of the right lower extremity. 10/28/2015 -- over the last week she has been draining a lot of fluid from her right lower extremities and several of the wounds which had healed in the past and now opened up again 11/06/2015 -- she was seen in the vascular office and the right ABI was more than 1.3 which was consistent with noncompressible arteries due to medial calcification and the right great toe pressure was 0.24 and the PPG waveform showed significant decrease. from talking to the family member I believe no procedure has been planned. She was also seen by her PCP was increased her Lasix to 80 mg a day and has ordered some lab work but these reports are not back. 11/20/2015 -- Recent labs done hemoglobin A1c was 6.2, INR was .93,  iron-binding capacity was 310, BMP was within normal limits except for the BUN which was 40 and albumin was 3.1. Her hemoglobin is up to 10.7 with a hematocrit of 34.2 he was advised lymphedema pumps by Dr. Gilda Crease and she has not been wearing these. Her daughter-in- law also says that she has been very noncompliant about elevation of her limbs. Electronic Signature(s) Signed: 11/20/2015 10:58:35 AM By: Evlyn Kanner MD,  FACS Previous Signature: 11/20/2015 10:26:37 AM Version By: Evlyn Kanner MD, FACS Miller, Alivia ([id number]) Entered By: Evlyn Kanner on 11/20/2015 10:58:35 [name] ([id number]) -------------------------------------------------------------------------------- Physical Exam Details Patient Name: [name] Date of Service: 11/20/2015 10:00 AM Medical Record Number: [id number] Patient Account Number: [id number] Date of Birth/Sex: 04-Feb-1921 (82 y.o. Female) Treating RN: Curtis Sites Primary Care Physician: Dorothey Baseman Other Clinician: Referring Physician: Dorothey Baseman Treating Physician/Extender: Rudene Re in Treatment: 40 Constitutional . Pulse regular. Respirations normal and unlabored. Afebrile. . Eyes Nonicteric. Reactive to light. Ears, Nose, Mouth, and Throat Lips, teeth, and gums WNL.Marland Kitchen Moist mucosa without lesions. Neck supple and nontender. No palpable supraclavicular or cervical adenopathy. Normal sized without goiter. Respiratory WNL. No retractions.. Breath sounds WNL, No rubs, rales, rhonchi, or wheeze.. Cardiovascular Heart rhythm and rate regular, no murmur or gallop.. Pedal Pulses WNL. No clubbing, cyanosis or edema. Chest Breasts symmetical and no nipple discharge.. Breast tissue WNL, no masses, lumps, or tenderness.. Lymphatic No adneopathy. No adenopathy. No adenopathy. Musculoskeletal Adexa without tenderness or enlargement.. Digits and nails w/o clubbing, cyanosis, infection, petechiae, ischemia, or inflammatory conditions.. Integumentary (Hair, Skin) No suspicious lesions. No crepitus or fluctuance. No peri-wound warmth or erythema. No masses.Marland Kitchen Psychiatric Judgement and insight Intact.. No evidence of depression, anxiety, or agitation.. Notes her lymphedema has increased quite significantly and there is extensive excoriation of the posterior lateral part of her lower third of the right leg. Even washing this with moist saline  gauze is extremely painful and debridement is not possible. There are also multiple ulcerations in the region of her forefoot and between her toes and this is a result of extensive lymphedema which has worsened over the last 2 weeks. Electronic Signature(s) Signed: 11/20/2015 10:59:41 AM By: Evlyn Kanner MD, FACS Entered By: Evlyn Kanner on 11/20/2015 10:59:40 [name] ([id number]) -------------------------------------------------------------------------------- Physician Orders Details Patient Name: [name] Date of Service: 11/20/2015 10:00 AM Medical Record Number: [id number] Patient Account Number: [id number] Date of Birth/Sex: 1921/03/01 (82 y.o. Female) Treating RN: Curtis Sites Primary Care Physician: Dorothey Baseman Other Clinician: Referring Physician: Dorothey Baseman Treating Physician/Extender: Rudene Re in Treatment: 97 Verbal / Phone Orders: Yes Clinician: Curtis Sites Read Back and Verified: Yes Diagnosis Coding Wound Cleansing Wound #6 Right,Posterior Lower Leg o Cleanse wound with mild soap and water o May Shower, gently pat wound dry prior to applying new dressing. Wound #9 Right,Anterior Lower Leg o Cleanse wound with mild soap and water o May Shower, gently pat wound dry prior to applying new dressing. Anesthetic Wound #6 Right,Posterior Lower Leg o Topical Lidocaine 4% cream applied to wound bed prior to debridement Wound #9 Right,Anterior Lower Leg o Topical Lidocaine 4% cream applied to wound bed prior to debridement Primary Wound Dressing Wound #6 Right,Posterior Lower Leg o Aquacel Ag Wound #9 Right,Anterior Lower Leg o Aquacel Ag Secondary Dressing Wound #6 Right,Posterior Lower Leg o Gauze, ABD and Kerlix/Conform o XtraSorb - where needed Wound #9 Right,Anterior Lower Leg o Gauze, ABD and Kerlix/Conform o XtraSorb - where needed Dressing Change Frequency Wound #6 Right,Posterior Lower Leg o  Change dressing every day. Wahlquist, Alene ([id number]) Wound #9 Right,Anterior Lower Leg o Change dressing every day. Follow-up Appointments Wound #6 Right,Posterior Lower Leg o Return Appointment in 2 weeks. Wound #9 Right,Anterior Lower Leg o Return Appointment in 2 weeks. Home Health Wound #6 Right,Posterior Lower Leg o Continue Home Health Visits - HHRN may use aquacel ag between toes if needed for drainage o Home Health Nurse may visit PRN to address patientos wound care needs. o FACE TO FACE ENCOUNTER: MEDICARE and MEDICAID PATIENTS: I certify that this patient is under my care and that I had a face-to-face encounter that meets the physician face-to-face encounter requirements with this patient on this date. The encounter with the patient was in whole or in part for the following MEDICAL CONDITION: (primary reason for Home Healthcare) MEDICAL NECESSITY: I certify, that based on my findings, NURSING services are a medically necessary home health service. HOME BOUND STATUS: I certify that my clinical findings support that this patient is homebound (i.e., Due to illness or injury, pt requires aid of supportive devices such as crutches, cane, wheelchairs, walkers, the use of special transportation or the assistance of another person to leave their place of residence. There is a normal inability to leave the home and doing so requires considerable and taxing effort. Other absences are for medical reasons / religious services and are infrequent or of short duration when for other reasons). o If current dressing causes regression in wound condition, may D/C ordered dressing product/s and apply Normal Saline Moist Dressing daily until next Wound Healing Center / Other MD appointment. Notify Wound Healing Center of regression in wound condition at [phone]. o Please direct any NON-WOUND related issues/requests for orders to patient's Primary Care Physician Wound #9  Right,Anterior Lower Leg o Continue Home Health Visits - HHRN may use aquacel ag between toes if needed for drainage o Home Health Nurse may visit PRN to address patientos wound care needs. o FACE TO FACE ENCOUNTER: MEDICARE and MEDICAID PATIENTS: I certify that this patient is under my care and that I had a face-to-face encounter that meets the physician face-to-face encounter requirements with this patient on this date. The encounter with the patient was in whole or in part for the following MEDICAL CONDITION: (primary reason for Home Healthcare) MEDICAL NECESSITY: I certify, that based on my findings, NURSING services are a medically necessary home health service. HOME BOUND STATUS: I certify that my clinical findings support that this patient is homebound (i.e., Due to illness or injury, pt requires aid of supportive devices such as crutches, cane, wheelchairs, walkers, the use of special transportation or the assistance of another person to leave their place of residence. There is a normal inability to leave the home and doing so requires considerable and taxing effort. Other absences are for medical reasons / religious services and are infrequent or of short duration when for other reasons). DISCH, Jacky ([id number]) o If current dressing causes regression in wound condition, may D/C ordered dressing product/s and apply Normal Saline Moist Dressing daily until next Wound Healing Center / Other MD appointment. Notify Wound Healing Center of regression in wound condition at [phone]. o Please direct any NON-WOUND related issues/requests for orders to patient's Primary Care Physician Electronic Signature(s) Signed: 11/20/2015 1:24:37 PM By: Evlyn Kanner MD, FACS Signed: 11/20/2015 5:02:34 PM By: Curtis Sites Entered By: Curtis Sites on 11/20/2015 10:42:37 [name]  ([id number]) -------------------------------------------------------------------------------- Problem List Details Patient Name: [name] Date of Service: 11/20/2015 10:00 AM Medical Record  Number: [id number] Patient Account Number: [id number] Date of Birth/Sex: August 26, 1921 (82 y.o. 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 02/11/2015 Yes fat layer exposed L97.812 Non-pressure chronic ulcer of other part of right lower leg 02/11/2015 Yes with fat layer exposed T81.31XA Disruption of external operation (surgical) wound, not 02/18/2015 Yes elsewhere classified, initial encounter Inactive Problems Resolved Problems Electronic Signature(s) Signed: 11/20/2015 10:57:32 AM By: Evlyn Kanner MD, FACS Entered By: Evlyn Kanner on 11/20/2015 10:57:31 [name] ([id number]) -------------------------------------------------------------------------------- Progress Note Details Patient Name: [name] Date of Service: 11/20/2015 10:00 AM Medical Record Number: [id number] Patient Account Number: [id number] Date of Birth/Sex: 1921-05-13 (82 y.o. Female) Treating RN: Curtis Sites Primary Care Physician: Dorothey Baseman Other Clinician: Referring Physician: Dorothey Baseman Treating Physician/Extender: Rudene Re in Treatment: 40 Subjective Chief Complaint Information obtained from Patient Patient presents to the wound care center for a consult due non  healing wound. 82 year old patient with comes with a history of a ulcerated area to the right third toe and the right heel which she's had for about 2 months. History of Present Illness (HPI) The following HPI elements were documented for the patient's wound: Location: right medial heel and right third toe Quality: Patient reports experiencing a dull pain to affected area(s). Severity: Patient states wound are getting worse. Duration: Patient has had the wound for > 3 months prior to seeking treatment at the wound center Timing: Pain in wound is Intermittent (comes and goes Context: The wound appeared gradually over time Modifying Factors: Consults to this date include:vascular surgeon and several recent surgeries. Associated Signs and Symptoms: Patient reports having foul odor and drainage from the left below-knee amputation site. A pleasant 82 year old patient who is known to have diabetes mellitus for several years has recently gone through a series of operations with the vascular surgeon Dr. Gilda Crease. In January and February she had several surgeries on the left lower extremity with an attempt to have limb salvage for gangrenous changes of her forefoot. 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 On 01/07/2015 she also had a right lower extremity distal runoff with a angioplasty of the right anterior tibial artery to maximize her blood flow to the foot. She recently had her staples removed at Dr. Marijean Heath office on 01/31/2015 and at that time a right lower extremity duplex was done which showed patent vessels and a patent stent with distal occluded posterior tibial artery. Though the duplex was noncritical the recommendations from the PA at the vascular surgery office was that of a angiogram to be done but the patient said she would rather try some bone care before. The patient has received doxycycline and Cipro  in the recent past and she takes oral medications for her diabetes. Other than that I reviewed her list of all her medications. No recent hemoglobin A1c has been done and no recent x-rays of the right foot have been done. 02/18/2015 -- x-ray of the right foot was done on 02/11/2015 and it shows no evidence of acute osteomyelitis of  the third toe or of the calcaneus. She had gone to the vascular surgery office and they had noted that there is dehiscence of the left part of the amputation site of the below-knee wound and they have asked [place] to kindly take over the care of this. There've been doing dressings for the right heel and right third toe. CHEYAN, FREES ([id number]) 02/25/2015 -- we have received notes from the vascular group who saw her last on 02/13/2015 and she was seen by the PA Ms. Cleda Daub. She had recommended that the patient continue to follow with [place] for wound care including management of the left BKA stump, which has had dehiscence of the lateral part. They will consider repeating a arterial duplex or angiogram if the right lower extremity does not heal within a reasonable period of time. 03/18/2015 -- he saw the vascular surgeon Dr. Gilda Crease and he has set her up for an angioplasty sometime in the middle of July. she is doing well otherwise. 04/10/2015 -- the patient had a procedure done on 04/08/2015 and this was a right angioplasty of the dorsalis pedis, anterior tibial artery, first superficial femoral artery in its midportion. There was successful intervention with recanalization and in-line flow to the right foot. 04/22/2015 -- the patient was looking rather pale today and the daughter confirms that her hemoglobin was down to 6.9. she is being monitored by her PCP. Her Lasix dose was increased and her edema has gone down significantly. 04/29/2015 -- she had vascular studies done and the ABI on the right is 1.3 and the toe pressures were within normal limits. Her  hemoglobin is still around 7 and she refuses to take any blood transfusions for religious reasons. Her potassium was normal. 06/10/2015 -- she has a new blister on her right lateral and posterior part of her leg just in the region where the Kerlix bandages were applied and this may be due to an abrasion. 06/24/2015 -- On her right lower extremity she has developed several more blisters which look like small pustules and the drain informed shallow ulcerations. 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 07/01/2015 -- she has an appointment with the PCP this Friday and her vascular surgeon on Monday. Other than that she is on doxycycline and has finished 1 week of treatment. The pustules she had all over have now resolved. 07/08/2015 -- she saw the vascular surgeon who did studies in the office and found that there is poor circulation in the distal lower right leg and has set her up for an angiogram and possible stenting next Tuesday. 07/22/2015 -- on October 18 she was taken up for a successful revascularization of the anterior tibial vessel on the right side. a PTA and stent placement was done to the right anterior tibial artery. 08/19/2015 -- she has broken out with some linear ulceration in the region of her webspaces of her toes and this is something new. 08/26/2015 -- over the last 3 days there was a streak of redness going from her lower extremity towards her toes but she had no fever or discharge from the wounds. 09/02/2015 -- the redness and cellulitis has gone down but she continues to have a lot of edema of the right lower extremity. 10/28/2015 -- over the last week she has been draining a lot of fluid from her right lower extremities and several of the wounds which had healed in the past and now opened up again 11/06/2015 -- she was seen in the vascular office and the  right ABI was more than 1.3 which was consistent with noncompressible arteries due to medial calcification and the right  great toe pressure was 0.24 and the PPG waveform showed significant decrease. from talking to the family member I believe no procedure has been planned. She was also seen by her PCP was increased her Lasix to 80 mg a day and has ordered some lab work but these reports are not back. INZA, MIKRUT ([id number]) 11/20/2015 -- Recent labs done hemoglobin A1c was 6.2, INR was .93, iron-binding capacity was 310, BMP was within normal limits except for the BUN which was 40 and albumin was 3.1. Her hemoglobin is up to 10.7 with a hematocrit of 34.2 he was advised lymphedema pumps by Dr. Gilda Crease and she has not been wearing these. Her daughter-in- law also says that she has been very noncompliant about elevation of her limbs. Objective Constitutional Pulse regular. Respirations normal and unlabored. Afebrile. Vitals Time Taken: 10:05 AM, Height: 62 in, Weight: 155 lbs, BMI: 28.3, Temperature: 98.2 F, Pulse: 43 bpm, Respiratory Rate: 18 breaths/min, Blood Pressure: 107/45 mmHg. Eyes Nonicteric. Reactive to light. Ears, Nose, Mouth, and Throat Lips, teeth, and gums WNL.Marland Kitchen Moist mucosa without lesions. Neck supple and nontender. No palpable supraclavicular or cervical adenopathy. Normal sized without goiter. Respiratory WNL. No retractions.. Breath sounds WNL, No rubs, rales, rhonchi, or wheeze.. Cardiovascular Heart rhythm and rate regular, no murmur or gallop.. Pedal Pulses WNL. No clubbing, cyanosis or edema. Chest Breasts symmetical and no nipple discharge.. Breast tissue WNL, no masses, lumps, or tenderness.. Lymphatic No adneopathy. No adenopathy. No adenopathy. Musculoskeletal Adexa without tenderness or enlargement.. Digits and nails w/o clubbing, cyanosis, infection, petechiae, ischemia, or inflammatory conditions.Marland Kitchen Psychiatric Judgement and insight Intact.. No evidence of depression, anxiety, or agitation.. General Notes: her lymphedema has increased quite significantly and there is  extensive excoriation of the Rys, [name] ([id number]) posterior lateral part of her lower third of the right leg. Even washing this with moist saline gauze is extremely painful and debridement is not possible. There are also multiple ulcerations in the region of her forefoot and between her toes and this is a result of extensive lymphedema which has worsened over the last 2 weeks. Integumentary (Hair, Skin) No suspicious lesions. No crepitus or fluctuance. No peri-wound warmth or erythema. No masses.. Wound #10 status is Open. Original cause of wound was Gradually Appeared. The wound is located on the Right,Distal,Anterior Lower Leg. The wound measures 0cm length x 0cm width x 0cm depth; 0cm^2 area and 0cm^3 volume. Wound #6 status is Open. Original cause of wound was Blister. The wound is located on the Right,Posterior Lower Leg. The wound measures 10.5cm length x 14cm width x 0.4cm depth; 115.454cm^2 area and 46.181cm^3 volume. The wound is limited to skin breakdown. There is no tunneling or undermining noted. There is a large amount of serous drainage noted. The wound margin is flat and intact. There is medium (34-66%) pink granulation within the wound bed. There is a medium (34-66%) amount of necrotic tissue within the wound bed including Adherent Slough. The periwound skin appearance exhibited: Localized Edema, Moist. The periwound skin appearance did not exhibit: Callus, Crepitus, Excoriation, Fluctuance, Friable, Induration, Rash, Scarring, Dry/Scaly, Maceration, Atrophie Blanche, Cyanosis, Ecchymosis, Hemosiderin Staining, Mottled, Pallor, Rubor, Erythema. Periwound temperature was noted as No Abnormality. The periwound has tenderness on palpation. Wound #9 status is Open. Original cause of wound was Gradually Appeared. The wound is located on the Right,Anterior Lower Leg. The wound measures 0.5cm length x 0.6cm  width x 0.1cm depth; 0.236cm^2 area and 0.024cm^3 volume. The wound is  limited to skin breakdown. There is no tunneling or undermining noted. There is a large amount of serous drainage noted. The wound margin is flat and intact. There is large (67-100%) pink granulation within the wound bed. There is a small (1-33%) amount of necrotic tissue within the wound bed including Adherent Slough. The periwound skin appearance exhibited: Localized Edema, Maceration, Moist, Erythema. The periwound skin appearance did not exhibit: Callus, Crepitus, Excoriation, Fluctuance, Friable, Induration, Rash, Scarring, Dry/Scaly, Atrophie Blanche, Cyanosis, Ecchymosis, Hemosiderin Staining, Mottled, Pallor, Rubor. The surrounding wound skin color is noted with erythema which is circumferential. The periwound has tenderness on palpation. 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 She will also have Aquacel placed between the toes. I have encouraged her to elevate her limb as much as possible and also use her lymphedema pumps which have been prescribed for her by her vascular surgeon. her most recent lab work has been reviewed with her and she has been urged to follow-up with her lab work and check on her potassium as she is taking large amounts of Lasix. Plan Wound Cleansing: Wound #6 Right,Posterior Lower Leg: Cleanse wound with mild soap and water May  Shower, gently pat wound dry prior to applying new dressing. Wound #9 Right,Anterior Lower Leg: Cleanse wound with mild soap and water May Shower, gently pat wound dry prior to applying new dressing. Anesthetic: Wound #6 Right,Posterior Lower Leg: Topical Lidocaine 4% cream applied to wound bed prior to debridement Wound #9 Right,Anterior Lower Leg: Topical Lidocaine 4% cream applied to wound bed prior to debridement Primary Wound Dressing: Wound #6 Right,Posterior Lower Leg: Aquacel Ag Wound #9 Right,Anterior Lower Leg: Aquacel Ag Secondary Dressing: Wound #6 Right,Posterior Lower Leg: Gauze, ABD and Kerlix/Conform XtraSorb - where needed Wound #9 Right,Anterior Lower Leg: Gauze, ABD and Kerlix/Conform XtraSorb - where needed Dressing Change Frequency: Wound #6 Right,Posterior Lower Leg: Change dressing every day. Wound #9 Right,Anterior Lower Leg: Change dressing every day. Follow-up Appointments: Wound #6 Right,Posterior Lower Leg: Return Appointment in 2 weeks. Wound #9 Right,Anterior Lower Leg: Return Appointment in 2 weeks. SYDNEY, Nithila ([id number]) Home Health: Wound #6 Right,Posterior Lower Leg: Continue Home Health Visits - HHRN may use aquacel ag between toes if needed for drainage Home Health Nurse may visit PRN to address patient s wound care needs. FACE TO FACE ENCOUNTER: MEDICARE and MEDICAID PATIENTS: I certify that this patient is under my care and that I had a face-to-face encounter that meets the physician face-to-face encounter requirements with this patient on this date. The encounter with the patient was in whole or in part for the following MEDICAL CONDITION: (primary reason for Home Healthcare) MEDICAL NECESSITY: I certify, that based on my findings, NURSING services are a medically necessary home health service. HOME BOUND STATUS: I certify that my clinical findings support that this patient is homebound (i.e.,  Due to illness or injury, pt requires  aid of supportive devices such as crutches, cane, wheelchairs, walkers, the use of special transportation or the assistance of another person to leave their place of residence. There is a normal inability to leave the home and doing so requires considerable and taxing effort. Other absences are for medical reasons / religious services and are infrequent or of short duration when for other reasons). If current dressing causes regression in wound condition, may D/C ordered dressing product/s and apply Normal Saline Moist Dressing daily until next Wound Healing Center / Other MD appointment. Notify Wound Healing Center of regression in wound condition at [phone]. Please direct any NON-WOUND related issues/requests for orders to patient's Primary Care Physician Wound #9 Right,Anterior Lower Leg: Continue Home Health Visits - HHRN may use aquacel ag between toes if needed for drainage Home Health Nurse may visit PRN to address patient s wound care needs. FACE TO FACE ENCOUNTER: MEDICARE and MEDICAID PATIENTS: I certify that this patient is under my care and that I had a face-to-face encounter that meets the physician face-to-face encounter requirements with this patient on this date. The encounter with the patient was in whole or in part for the following MEDICAL CONDITION: (primary reason for Home Healthcare) MEDICAL NECESSITY: I certify, that based on my findings, NURSING services are a medically necessary home health service. HOME BOUND STATUS: I certify that my clinical findings support that this patient is homebound (i.e., Due to illness or injury, pt requires aid of supportive devices such as crutches, cane, wheelchairs, walkers, the use of special transportation or the assistance of another person to leave their place of residence. There is a normal inability to leave the home and doing so requires considerable and taxing effort. Other absences are for medical reasons / religious services  and are infrequent or of short duration when for other reasons). If current dressing causes regression in wound condition, may D/C ordered dressing product/s and apply Normal Saline Moist Dressing daily until next Wound Healing Center / Other MD appointment. Notify Wound Healing Center of regression in wound condition at [phone]. Please direct any NON-WOUND related issues/requests for orders to patient's Primary Care Physician I have recommended silver alginate to be applied over all the raw surfaces and wraped lightly with Kerlix gauze to keep it in place. She will also have Aquacel placed between the toes. I have encouraged her to elevate her limb as much as possible and also use her lymphedema pumps which have been prescribed for her by her vascular surgeon. her most recent lab work has been reviewed with her and she has been urged to follow-up with her lab work and check on her potassium as she is taking large amounts of Lasix. ALIZZA, SACRA ([id number]) Electronic Signature(s) Signed: 11/21/2015 4:57:59 PM By: Evlyn Kanner MD, FACS Previous Signature: 11/20/2015 11:01:28 AM Version By: Evlyn Kanner MD, FACS Entered By: Evlyn Kanner on 11/21/2015 16:57:59 [name] ([id number]) -------------------------------------------------------------------------------- SuperBill Details Patient Name: [name] Date of Service: 11/20/2015 Medical Record Number: [id number] Patient Account Number: [id number] Date of Birth/Sex: [birth year] (82 y.o. Female) Treating RN: Curtis Sites Primary Care Physician: Dorothey Baseman Other Clinician: Referring Physician: Dorothey Baseman Treating Physician/Extender: Rudene Re in Treatment: 40 Diagnosis Coding ICD-10 Codes Code Description E11.621 Type 2 diabetes mellitus with foot ulcer I70.235 Atherosclerosis of native arteries of right leg with ulceration of other part of foot Z89.512 Acquired absence of left leg below  knee L97.412 Non-pressure chronic ulcer of right heel  and midfoot with fat layer exposed L97.812 Non-pressure chronic ulcer of other part of right lower leg with fat layer exposed Disruption of external operation (surgical) wound, not elsewhere classified, initial T81.31XA encounter Facility Procedures CPT4 Code: 16109604 Description: 99214 - WOUND CARE VISIT-LEV 4 EST PT Modifier: Quantity: 1 Physician Procedures CPT4: Description Modifier Quantity Code [id number] 99213 - WC PHYS LEVEL 3 - EST PT 1 ICD-10 Description Diagnosis E11.621 Type 2 diabetes mellitus with foot ulcer I70.235 Atherosclerosis of native arteries of right leg with ulceration of other part of  foot 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

## 2015-11-21 NOTE — Progress Notes (Signed)
[name], [name] ([id number]) Visit Report for 11/20/2015 Arrival Information Details Patient Name: [name], [name] Date of Service: 11/20/2015 10:00 AM Medical Record Number: [id number] Patient Account Number: [id number] Date of Birth/Sex: 04/10/1921 (82 y.o. Female) Treating RN: Curtis Sites Primary Care Physician: Dorothey Baseman Other Clinician: Referring Physician: Dorothey Baseman Treating Physician/Extender: Rudene Re in Treatment: 40 Visit Information History Since Last Visit Added or deleted any medications: No Patient Arrived: Wheel Chair Any new allergies or adverse reactions: No Arrival Time: 10:02 Had a fall or experienced change in No activities of daily living that may affect Accompanied By: dtr risk of falls: Transfer Assistance: Manual Signs or symptoms of abuse/neglect since last No Patient Identification Verified: Yes visito Secondary Verification Process Yes Hospitalized since last visit: No Completed: Pain Present Now: No Patient Has Alerts: Yes Electronic Signature(s) Signed: 11/20/2015 5:02:34 PM By: Curtis Sites Entered By: Curtis Sites on 11/20/2015 10:03:45 [name] ([id number]) -------------------------------------------------------------------------------- Clinic Level of Care Assessment Details Patient Name: [name] Date of Service: 11/20/2015 10:00 AM Medical Record Number: [id number] Patient Account Number: [id number] Date of Birth/Sex: 20-Oct-1920 (82 y.o. Female) Treating RN: Curtis Sites Primary Care Physician: Terance Hart, DAVID Other Clinician: Referring Physician: Terance Hart, DAVID Treating Physician/Extender: Rudene Re in Treatment: 40 Clinic Level of Care Assessment Items TOOL 4 Quantity Score  - Use when only an EandM is performed on FOLLOW-UP visit 0 ASSESSMENTS - Nursing Assessment / Reassessment X - Reassessment of Co-morbidities (includes updates in patient status) 1 10 X - Reassessment of  Adherence to Treatment Plan 1 5 ASSESSMENTS - Wound and Skin Assessment / Reassessment  - Simple Wound Assessment / Reassessment - one wound 0 X - Complex Wound Assessment / Reassessment - multiple wounds 2 5  - Dermatologic / Skin Assessment (not related to wound area) 0 ASSESSMENTS - Focused Assessment  - Circumferential Edema Measurements - multi extremities 0  - Nutritional Assessment / Counseling / Intervention 0 X - Lower Extremity Assessment (monofilament, tuning fork, pulses) 1 5 X - Peripheral Arterial Disease Assessment (using hand held doppler) 1 10 ASSESSMENTS - Ostomy and/or Continence Assessment and Care  - Incontinence Assessment and Management 0  - Ostomy Care Assessment and Management (repouching, etc.) 0 PROCESS - Coordination of Care X - Simple Patient / Family Education for ongoing care 1 15  - Complex (extensive) Patient / Family Education for ongoing care 0  - Staff obtains Chiropractor, Records, Test Results / Process Orders 0  - Staff telephones HHA, Nursing Homes / Clarify orders / etc 0  - Routine Transfer to another Facility (non-emergent condition) 0 [name], [name] ([id number])  - Routine Hospital Admission (non-emergent condition) 0  - New Admissions / Manufacturing engineer / Ordering NPWT, Apligraf, etc. 0  - Emergency Hospital Admission (emergent condition) 0 X - Simple Discharge Coordination 1 10  - Complex (extensive) Discharge Coordination 0 PROCESS - Special Needs  - Pediatric / Minor Patient Management 0  - Isolation Patient Management 0  - Hearing / Language / Visual special needs 0  - Assessment of Community assistance (transportation, D/C planning, etc.) 0  - Additional assistance / Altered mentation 0  - Support Surface(s) Assessment (bed, cushion, seat, etc.) 0 INTERVENTIONS - Wound Cleansing / Measurement  - Simple Wound Cleansing - one wound 0 X - Complex Wound Cleansing - multiple wounds 2 5 X -  Wound Imaging (photographs - any number of wounds) 1 5  - Wound Tracing (instead of photographs) 0  - Simple Wound Measurement - one wound 0  X - Complex Wound Measurement - multiple wounds 2 5 INTERVENTIONS - Wound Dressings []  - Small Wound Dressing one or multiple wounds 0 X - Medium Wound Dressing one or multiple wounds 2 15 []  - Large Wound Dressing one or multiple wounds 0 []  - Application of Medications - topical 0 []  - Application of Medications - injection 0 INTERVENTIONS - Miscellaneous []  - External ear exam 0 [name], [name] ([id number]) []  - Specimen Collection (cultures, biopsies, blood, body fluids, etc.) 0 []  - Specimen(s) / Culture(s) sent or taken to Lab for analysis 0 []  - Patient Transfer (multiple staff / Michiel Sites Lift / Similar devices) 0 []  - Simple Staple / Suture removal (25 or less) 0 []  - Complex Staple / Suture removal (26 or more) 0 []  - Hypo / Hyperglycemic Management (close monitor of Blood Glucose) 0 []  - Ankle / Brachial Index (ABI) - do not check if billed separately 0 X - Vital Signs 1 5 Has the patient been seen at the hospital within the last three years: Yes Total Score: 125 Level Of Care: New/Established - Level 4 Electronic Signature(s) Signed: 11/20/2015 5:02:34 PM By: Curtis Sites Entered By: Curtis Sites on 11/20/2015 10:43:17 [name] ([id number]) -------------------------------------------------------------------------------- Encounter Discharge Information Details Patient Name: [name] Date of Service: 11/20/2015 10:00 AM Medical Record Number: [id number] Patient Account Number: [id number] Date of Birth/Sex: 10/25/1920 (82 y.o. Female) Treating RN: Curtis Sites Primary Care Physician: Dorothey Baseman Other Clinician: Referring Physician: Dorothey Baseman Treating Physician/Extender: Rudene Re in Treatment: 89 Encounter Discharge Information Items Discharge Pain Level: 0 Discharge Condition:  Stable Ambulatory Status: Wheelchair Discharge Destination: Home Transportation: Private Auto Accompanied By: dtr Schedule Follow-up Appointment: Yes Medication Reconciliation completed and provided to Patient/Care No Elbert Polyakov: Provided on Clinical Summary of Care: 11/20/2015 Form Type Recipient Paper Patient HS Electronic Signature(s) Signed: 11/20/2015 10:58:03 AM By: Gwenlyn Perking Entered By: Gwenlyn Perking on 11/20/2015 10:58:03 [name] ([id number]) -------------------------------------------------------------------------------- Lower Extremity Assessment Details Patient Name: [name] Date of Service: 11/20/2015 10:00 AM Medical Record Number: [id number] Patient Account Number: [id number] Date of Birth/Sex: 1920/12/16 (82 y.o. Female) Treating RN: Curtis Sites Primary Care Physician: Dorothey Baseman Other Clinician: Referring Physician: Dorothey Baseman Treating Physician/Extender: Rudene Re in Treatment: 40 Edema Assessment Assessed: [Left: No] [Right: No] Edema: [Left: Ye] [Right: s] Vascular Assessment Pulses: Posterior Tibial Palpable: [Right:No] Doppler: [Right:Monophasic] Dorsalis Pedis Palpable: [Right:No] Doppler: [Right:Monophasic] Extremity colors, hair growth, and conditions: Extremity Color: [Right:Red] Hair Growth on Extremity: [Right:No] Temperature of Extremity: [Right:Cool] Capillary Refill: [Right:< 3 seconds] Electronic Signature(s) Signed: 11/20/2015 5:02:34 PM By: Curtis Sites Entered By: Curtis Sites on 11/20/2015 10:18:02 [name] ([id number]) -------------------------------------------------------------------------------- Multi Wound Chart Details Patient Name: [name] Date of Service: 11/20/2015 10:00 AM Medical Record Number: [id number] Patient Account Number: [id number] Date of Birth/Sex: July 31, 1921 (82 y.o. Female) Treating RN: Curtis Sites Primary Care Physician: Dorothey Baseman Other  Clinician: Referring Physician: Terance Hart, DAVID Treating Physician/Extender: Rudene Re in Treatment: 40 Vital Signs Height(in): 62 Pulse(bpm): 43 Weight(lbs): 155 Blood Pressure 107/45 (mmHg): Body Mass Index(BMI): 28 Temperature(F): 98.2 Respiratory Rate 18 (breaths/min): Photos: [10:No Photos] [6:No Photos] [9:No Photos] Wound Location: [10:Right, Distal, Anterior Lower Leg] [6:Right Lower Leg - Posterior] [9:Right Lower Leg - Anterior] Wounding Event: [10:Gradually Appeared] [6:Blister] [9:Gradually Appeared] 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 of] [9:the Lower Extremity] Comorbid History: [10:N/A] [6:Arrhythmia, Deep Vein Thrombosis, Peripheral Arterial Disease, Type II Arterial Disease, Type II Diabetes] [9:Arrhythmia, Deep Vein Thrombosis, Peripheral Diabetes] Date  Acquired: [10:08/31/2015] [6:06/12/2015] [9:08/31/2015] Weeks of Treatment: [10:11] [6:22] [9:11] Wound Status: [10:Open] [6:Open] [9:Open] Measurements L x W x D 0x0x0 [6:10.5x14x0.4] [9:0.5x0.6x0.1] (cm) Area (cm) : [10:0] [6:115.454] [9:0.236] Volume (cm) : [10:0] [6:46.181] [9:0.024] % Reduction in Area: [10:100.00%] [6:-14607.50%] [9:24.80%] % Reduction in Volume: 100.00% [6:-58357.00%] [9:61.90%] Classification: [10:Grade 1] [6:Full Thickness Without Exposed Support Structures] [9:Grade 1] HBO Classification: [10:N/A] [6:Grade 1] [9:N/A] Exudate Amount: [10:N/A] [6:Large] [9:Large] Exudate Type: [10:N/A] [6:Serous] [9:Serous] Exudate Color: [10:N/A] [6:amber] [9:amber] Wound Margin: [10:N/A] [6:Flat and Intact] [9:Flat and Intact] Granulation Amount: [10:N/A] [6:Medium (34-66%)] [9:Large (67-100%)] Granulation Quality: [10:N/A] [6:Pink] [9:Pink] Necrotic Amount: [10:N/A] [6:Medium (34-66%)] [9:Small (1-33%)] Epithelialization: N/A None None Periwound Skin Texture: No Abnormalities Noted Edema: Yes Edema:  Yes Excoriation: No Excoriation: No Induration: No Induration: No Callus: No Callus: No Crepitus: No Crepitus: No Fluctuance: No Fluctuance: No Friable: No Friable: No Rash: No Rash: No Scarring: No Scarring: No Periwound Skin No Abnormalities Noted Moist: Yes Maceration: Yes Moisture: Maceration: No Moist: Yes Dry/Scaly: No Dry/Scaly: No Periwound Skin Color: No Abnormalities Noted Atrophie Blanche: No Erythema: Yes Cyanosis: No Atrophie Blanche: No Ecchymosis: No Cyanosis: No Erythema: No Ecchymosis: No Hemosiderin Staining: No Hemosiderin Staining: No Mottled: No Mottled: No Pallor: No Pallor: No Rubor: No Rubor: No Erythema Location: N/A N/A Circumferential Temperature: N/A No Abnormality N/A Tenderness on No Yes Yes Palpation: Wound Preparation: N/A Ulcer Cleansing: Ulcer Cleansing: Rinsed/Irrigated with Rinsed/Irrigated with Saline Saline Topical Anesthetic Topical Anesthetic Applied: Other: lidocaine Applied: Other: lidocaine 4% 4% Treatment Notes Electronic Signature(s) Signed: 11/20/2015 5:02:34 PM By: Curtis Sites Entered By: Curtis Sites on 11/20/2015 10:39:12 [name] ([id number]) -------------------------------------------------------------------------------- Multi-Disciplinary Care Plan Details Patient Name: [name] Date of Service: 11/20/2015 10:00 AM Medical Record Number: [id number] Patient Account Number: [id number] Date of Birth/Sex: 03-14-1921 (82 y.o. Female) Treating RN: Curtis Sites Primary Care Physician: Dorothey Baseman Other Clinician: Referring Physician: Dorothey Baseman Treating Physician/Extender: Rudene Re in Treatment: 40 Active Inactive Abuse / Safety / Falls / Self Care Management Nursing Diagnoses: Impaired physical mobility Potential for falls Goals: Patient will remain injury free Date Initiated: 02/11/2015 Goal Status: Active Patient/caregiver will verbalize understanding of  skin care regimen Date Initiated: 02/11/2015 Goal Status: Active Patient/caregiver will verbalize/demonstrate measures taken to prevent injury and/or falls Date Initiated: 02/11/2015 Goal Status: Active Patient/caregiver will verbalize/demonstrate understanding of what to do in case of emergency Date Initiated: 02/11/2015 Goal Status: Active Interventions: Assess fall risk on admission and as needed Provide education on fall prevention Provide education on safe transfers Treatment Activities: Patient referred to home care : 11/20/2015 Notes: Nutrition Nursing Diagnoses: Imbalanced nutrition Potential for alteratiion in Nutrition/Potential for imbalanced nutrition [name], [name] ([id number]) Goals: Patient/caregiver verbalizes understanding of need to maintain therapeutic glucose control per primary care physician Date Initiated: 02/11/2015 Goal Status: Active Patient/caregiver will maintain therapeutic glucose control Date Initiated: 02/11/2015 Goal Status: Active Interventions: Assess HgA1c results as ordered upon admission and as needed Provide education on elevated blood sugars and impact on wound healing Provide education on nutrition Treatment Activities: Education provided on Nutrition : 04/22/2015 Obtain HgA1c : 11/20/2015 Notes: Wound/Skin Impairment Nursing Diagnoses: Impaired tissue integrity Knowledge deficit related to ulceration/compromised skin integrity Goals: Patient/caregiver will verbalize understanding of skin care regimen Date Initiated: 02/11/2015 Goal Status: Active Ulcer/skin breakdown will heal within 14 weeks Date Initiated: 02/11/2015 Goal Status: Active Interventions: Assess patient/caregiver ability to obtain necessary supplies Assess patient/caregiver ability to perform ulcer/skin care regimen upon admission and as needed Assess ulceration(s) every visit Provide education on  ulcer and skin care Treatment Activities: Skin care regimen initiated  : 11/20/2015 Topical wound management initiated : 11/20/2015 Notes: [name], [name] ([id number]) Electronic Signature(s) Signed: 11/20/2015 5:02:34 PM By: Curtis Sites Entered By: Curtis Sites on 11/20/2015 10:39:04 [name] ([id number]) -------------------------------------------------------------------------------- Pain Assessment Details Patient Name: [name] Date of Service: 11/20/2015 10:00 AM Medical Record Number: [id number] Patient Account Number: [id number] Date of Birth/Sex: [birth year] (82 y.o. Female) Treating RN: Curtis Sites Primary Care Physician: Dorothey Baseman Other Clinician: Referring Physician: Dorothey Baseman Treating Physician/Extender: Rudene Re in Treatment: 40 Active Problems Location of Pain Severity and Description of Pain Patient Has Paino Yes Site Locations Pain Location: Pain in Ulcers With Dressing Change: Yes Duration of the Pain. Constant / Intermittento Constant Pain Management and Medication Current Pain Management: Electronic Signature(s) Signed: 11/20/2015 5:02:34 PM By: Curtis Sites Entered By: Curtis Sites on 11/20/2015 10:05:02 [name] ([id number]) -------------------------------------------------------------------------------- Patient/Caregiver Education Details Patient Name: [name] Date of Service: 11/20/2015 10:00 AM Medical Record Number: [id number] Patient Account Number: [id number] Date of Birth/Gender: 1921-02-03 (82 y.o. Female) Treating RN: Curtis Sites Primary Care Physician: Dorothey Baseman Other Clinician: Referring Physician: Dorothey Baseman Treating Physician/Extender: Rudene Re in Treatment: 32 Education Assessment Education Provided To: Patient and Caregiver Education Topics Provided Venous: Handouts: Other: leg elevation and compression pumps Methods: Demonstration, Explain/Verbal Responses: State content correctly Electronic Signature(s) Signed:  11/20/2015 5:02:34 PM By: Curtis Sites Entered By: Curtis Sites on 11/20/2015 10:58:06 [name] ([id number]) -------------------------------------------------------------------------------- Wound Assessment Details Patient Name: [name] Date of Service: 11/20/2015 10:00 AM Medical Record Number: [id number] Patient Account Number: [id number] Date of Birth/Sex: July 20, 1921 (82 y.o. Female) Treating RN: Curtis Sites Primary Care Physician: Dorothey Baseman Other Clinician: Referring Physician: Terance Hart, DAVID Treating Physician/Extender: Rudene Re in Treatment: 40 Wound Status Wound Number: 10 Primary Diabetic Wound/Ulcer of the Lower Etiology: Extremity Wound Location: Right, Distal, Anterior Lower Leg Wound Status: Open Wounding Event: Gradually Appeared Date Acquired: 08/31/2015 Weeks Of Treatment: 11 Clustered Wound: No Photos Photo Uploaded By: Curtis Sites on 11/20/2015 16:47:30 Wound Measurements Length: (cm) 0 % Reduction i Width: (cm) 0 % Reduction i Depth: (cm) 0 Area: (cm) 0 Volume: (cm) 0 n Area: 100% n Volume: 100% Wound Description Classification: Grade 1 Periwound Skin Texture Texture Color No Abnormalities Noted: No No Abnormalities Noted: No Moisture No Abnormalities Noted: No Electronic Signature(s) Signed: 11/20/2015 5:02:34 PM By: Mechele Dawley, Casie ([id number]) Entered By: Curtis Sites on 11/20/2015 10:21:07 [name] ([id number]) -------------------------------------------------------------------------------- Wound Assessment Details Patient Name: [name] Date of Service: 11/20/2015 10:00 AM Medical Record Number: [id number] Patient Account Number: [id number] Date of Birth/Sex: Nov 14, 1920 (82 y.o. Female) Treating RN: Curtis Sites Primary Care Physician: Dorothey Baseman Other Clinician: Referring Physician: Terance Hart, DAVID Treating Physician/Extender: Rudene Re in Treatment:  40 Wound Status Wound Number: 6 Primary Arterial Insufficiency Ulcer Etiology: Wound Location: Right Lower Leg - Posterior Wound Open Wounding Event: Blister Status: Date Acquired: 06/12/2015 Comorbid Arrhythmia, Deep Vein Thrombosis, Weeks Of Treatment: 22 History: Peripheral Arterial Disease, Type II Clustered Wound: No Diabetes Photos Wound Measurements Length: (cm) 10.5 Width: (cm) 14 Depth: (cm) 0.4 Area: (cm) 115.454 Volume: (cm) 46.181 % Reduction in Area: -14607.5% % Reduction in Volume: -58357% Epithelialization: None Tunneling: No Undermining: No Wound Description Full Thickness Without Foul Odor Aft Classification: Exposed Support Structures Diabetic Severity Grade 1 (Wagner): Wound Margin: Flat and Intact Exudate Amount: Large Exudate Type: Serous Exudate Color: amber er Cleansing: No Wound Bed Granulation Amount: Medium (34-66%) Exposed Structure Granulation Quality: Pink  Fascia Exposed: No [name], [name] ([id number]) Necrotic Amount: Medium (34-66%) Fat Layer Exposed: No Necrotic Quality: Adherent Slough Tendon Exposed: No Muscle Exposed: No Joint Exposed: No Bone Exposed: No Limited to Skin Breakdown Periwound Skin Texture Texture Color No Abnormalities Noted: No No Abnormalities Noted: No Callus: No Atrophie Blanche: No Crepitus: No Cyanosis: No Excoriation: No Ecchymosis: No Fluctuance: No Erythema: No Friable: No Hemosiderin Staining: No Induration: No Mottled: No Localized Edema: Yes Pallor: No Rash: No Rubor: No Scarring: No Temperature / Pain Moisture Temperature: No Abnormality No Abnormalities Noted: No Tenderness on Palpation: Yes Dry / Scaly: No Maceration: No Moist: Yes Wound Preparation Ulcer Cleansing: Rinsed/Irrigated with Saline Topical Anesthetic Applied: Other: lidocaine 4%, Treatment Notes Wound #6 (Right, Posterior Lower Leg) 1. Cleansed with: Clean wound with Normal Saline 2.  Anesthetic Topical Lidocaine 4% cream to wound bed prior to debridement 4. Dressing Applied: Aquacel Ag Other dressing (specify in notes) 5. Secondary Dressing Applied Kerlix/Conform 7. Secured with Tape Notes xtrasorb Electronic Signature(s) [name], [name] ([id number]) Signed: 11/20/2015 4:49:25 PM By: Curtis Sites Entered By: Curtis Sites on 11/20/2015 16:49:25 [name] ([id number]) -------------------------------------------------------------------------------- Wound Assessment Details Patient Name: [name] Date of Service: 11/20/2015 10:00 AM Medical Record Number: [id number] Patient Account Number: [id number] Date of Birth/Sex: Feb 05, 1921 (82 y.o. Female) Treating RN: Curtis Sites Primary Care Physician: Dorothey Baseman Other Clinician: Referring Physician: Terance Hart, DAVID Treating Physician/Extender: Rudene Re in Treatment: 40 Wound Status Wound Number: 9 Primary Diabetic Wound/Ulcer of the Lower Etiology: Extremity Wound Location: Right Lower Leg - Anterior Wound Open Wounding Event: Gradually Appeared Status: Date Acquired: 08/31/2015 Comorbid Arrhythmia, Deep Vein Thrombosis, Weeks Of Treatment: 11 History: Peripheral Arterial Disease, Type II Clustered Wound: No Diabetes Photos Photo Uploaded By: Curtis Sites on 11/20/2015 16:48:13 Wound Measurements Length: (cm) 0.5 Width: (cm) 0.6 Depth: (cm) 0.1 Area: (cm) 0.236 Volume: (cm) 0.024 % Reduction in Area: 24.8% % Reduction in Volume: 61.9% Epithelialization: None Tunneling: No Undermining: No Wound Description Classification: Grade 1 Wound Margin: Flat and Intact Exudate Amount: Large Exudate Type: Serous Exudate Color: amber Foul Odor After Cleansing: No Wound Bed Granulation Amount: Large (67-100%) Exposed Structure Granulation Quality: Pink Fascia Exposed: No Necrotic Amount: Small (1-33%) Fat Layer Exposed: No Necrotic Quality: Adherent Slough Tendon  Exposed: No [name], [name] ([id number]) Muscle Exposed: No Joint Exposed: No Bone Exposed: No Limited to Skin Breakdown Periwound Skin Texture Texture Color No Abnormalities Noted: No No Abnormalities Noted: No Callus: No Atrophie Blanche: No Crepitus: No Cyanosis: No Excoriation: No Ecchymosis: No Fluctuance: No Erythema: Yes Friable: No Erythema Location: Circumferential Induration: No Hemosiderin Staining: No Localized Edema: Yes Mottled: No Rash: No Pallor: No Scarring: No Rubor: No Moisture Temperature / Pain No Abnormalities Noted: No Tenderness on Palpation: Yes Dry / Scaly: No Maceration: Yes Moist: Yes Wound Preparation Ulcer Cleansing: Rinsed/Irrigated with Saline Topical Anesthetic Applied: Other: lidocaine 4%, Treatment Notes Wound #9 (Right, Anterior Lower Leg) 1. Cleansed with: Clean wound with Normal Saline 2. Anesthetic Topical Lidocaine 4% cream to wound bed prior to debridement 4. Dressing Applied: Aquacel Ag Other dressing (specify in notes) 5. Secondary Dressing Applied Kerlix/Conform 7. Secured with Tape Notes Armed forces operational officer) Signed: 11/20/2015 5:02:34 PM By: Curtis Sites Entered By: Curtis Sites on 11/20/2015 10:38:47 [name] ([id number]) [name] [place], Eyvonne Left ([id number]) -------------------------------------------------------------------------------- Vitals Details Patient Name: [name] Date of Service: 11/20/2015 10:00 AM Medical Record Number: [id number] Patient Account Number: [id number] Date of Birth/Sex: [birth year] (82 y.o. Female) Treating RN: Curtis Sites Primary Care Physician: Terance Hart, DAVID Other  Clinician: Referring Physician: Terance Hart, DAVID Treating Physician/Extender: Rudene Re in Treatment: 40 Vital Signs Time Taken: 10:05 Temperature (F): 98.2 Height (in): 62 Pulse (bpm): 43 Weight (lbs): 155 Respiratory Rate (breaths/min): 18 Body Mass Index (BMI): 28.3 Blood  Pressure (mmHg): 107/45 Reference Range: 80 - 120 mg / dl Electronic Signature(s) Signed: 11/20/2015 5:02:34 PM By: Curtis Sites Entered By: Curtis Sites on 11/20/2015 10:07:52

## 2015-12-04 ENCOUNTER — Encounter: Payer: Medicare Other | Attending: Surgery | Admitting: Surgery

## 2015-12-04 DIAGNOSIS — L97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: Secondary | ICD-10-CM | POA: Diagnosis not present

## 2015-12-04 DIAGNOSIS — E11621 Type 2 diabetes mellitus with foot ulcer: Secondary | ICD-10-CM | POA: Diagnosis not present

## 2015-12-04 DIAGNOSIS — I70235 Atherosclerosis of native arteries of right leg with ulceration of other part of foot: Secondary | ICD-10-CM | POA: Diagnosis not present

## 2015-12-04 DIAGNOSIS — Z89512 Acquired absence of left leg below knee: Secondary | ICD-10-CM | POA: Diagnosis not present

## 2015-12-04 DIAGNOSIS — T8131XA Disruption of external operation (surgical) wound, not elsewhere classified, initial encounter: Secondary | ICD-10-CM | POA: Insufficient documentation

## 2015-12-04 DIAGNOSIS — L97812 Non-pressure chronic ulcer of other part of right lower leg with fat layer exposed: Secondary | ICD-10-CM | POA: Diagnosis not present

## 2015-12-04 NOTE — Progress Notes (Signed)
WINEFRED, HILLESHEIM ([id number]) Visit Report for 12/04/2015 Chief Complaint Document Details Patient Name: [name], [name] Date of Service: 12/04/2015 10:00 AM Medical Record Number: [id number] Patient Account Number: [id number] Date of Birth/Sex: 09/19/1921 (82 y.o. Female) Treating RN: Curtis Sites Primary Care Physician: Dorothey Baseman Other Clinician: Referring Physician: Dorothey Baseman Treating Physician/Extender: Rudene Re in Treatment: 28 Information Obtained from: Patient Chief Complaint Patient presents to the wound care center for a consult due non healing wound. 82 year old patient with comes with a history of a ulcerated area to the right third toe and the right heel which she's had for about 2 months. Electronic Signature(s) Signed: 12/04/2015 11:01:34 AM By: Evlyn Kanner MD, FACS Entered By: Evlyn Kanner on 12/04/2015 11:01:34 [name] ([id number]) -------------------------------------------------------------------------------- HPI Details Patient Name: [name] Date of Service: 12/04/2015 10:00 AM Medical Record Number: [id number] Patient Account Number: [id number] Date of Birth/Sex: 01/07/1921 (82 y.o. Female) Treating RN: Curtis Sites Primary Care Physician: Dorothey Baseman Other Clinician: Referring Physician: Dorothey Baseman Treating Physician/Extender: Rudene Re in Treatment: 35 History of Present Illness Location: right medial heel and right third toe Quality: Patient reports experiencing a dull pain to affected area(s). Severity: Patient states wound are getting worse. Duration: Patient has had the wound for > 3 months prior to seeking treatment at the wound center Timing: Pain in wound is Intermittent (comes and goes Context: The wound appeared gradually over time Modifying Factors: Consults to this date include:vascular surgeon and several recent surgeries. Associated Signs and Symptoms: Patient reports having foul odor and  drainage from the left below-knee amputation site. HPI Description: A pleasant 82 year old patient who is known to have diabetes mellitus for several years has recently gone through a series of operations with the vascular surgeon Dr. Gilda Crease. In January and February she had several surgeries on the left lower extremity with an attempt to have limb salvage for gangrenous changes of her forefoot. 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 On 01/07/2015 she also had a right lower extremity distal runoff with a angioplasty of the right anterior tibial artery to maximize her blood flow to the foot. She recently had her staples removed at Dr. Marijean Heath office on 01/31/2015 and at that time a right lower extremity duplex was done which showed patent vessels and a patent stent with distal occluded posterior tibial artery. Though the duplex was noncritical the recommendations from the PA at the vascular surgery office was that of a angiogram to be done but the patient said she would rather try some bone care before. The patient has received doxycycline and Cipro in the recent past and she takes oral medications for her diabetes. Other than that I reviewed her list of all her medications. No recent hemoglobin A1c has been done and no recent x-rays of the right foot have been done. 02/18/2015 -- x-ray of the right foot was done on 02/11/2015 and it shows no evidence of acute osteomyelitis of the third toe or of the calcaneus. She had gone to the vascular surgery office and they had noted that there is dehiscence of the left part of the amputation site of the below-knee wound and they have asked [place] to kindly take over the care of this. There've been doing dressings for the right heel and right third toe. 02/25/2015 -- we have received notes from the vascular group who saw her last on 02/13/2015 and she was seen by the PA Ms.  Cleda DaubKimberly Stegmayer. She had recommended that the patient continue to follow with us for wound care including management of the left BKA stump, which has had dehiscence of the lateral part. They will consider repeating a arterial duplex or angiogram if the right lower extremity does not heal within a reasonable period of time. 03/18/2015 -- he saw the vascular surgeon Dr. Gilda CreaseSchnier and he has set her up for an angioplasty sometime in the middle of July. she is doing well otherwise. [name] NationsSILETZKY, [name] ([id number]) 04/10/2015 -- the patient had a procedure done on 04/08/2015 and this was a right angioplasty of the dorsalis pedis, anterior tibial artery, first superficial femoral artery in its midportion. There was successful intervention with recanalization and in-line flow to the right foot. 04/22/2015 -- the patient was looking rather pale today and the daughter confirms that her hemoglobin was down to 6.9. she is being monitored by her PCP. Her Lasix dose was increased and her edema has gone down significantly. 04/29/2015 -- she had vascular studies done and the ABI on the right is 1.3 and the toe pressures were within normal limits. Her hemoglobin is still around 7 and she refuses to take any blood transfusions for religious reasons. Her potassium was normal. 06/10/2015 -- she has a new blister on her right lateral and posterior part of her leg just in the region where the Kerlix bandages were applied and this may be due to an abrasion. 06/24/2015 -- On her right lower extremity she has developed several more blisters which look like small pustules and the drain informed shallow ulcerations. 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 07/01/2015 -- she has an appointment with the PCP this Friday and her vascular surgeon on Monday. Other than that she is on doxycycline and has finished 1 week of treatment. The pustules she had all over have now resolved. 07/08/2015 -- she saw the vascular surgeon who did  studies in the office and found that there is poor circulation in the distal lower right leg and has set her up for an angiogram and possible stenting next Tuesday. 07/22/2015 -- on October 18 she was taken up for a successful revascularization of the anterior tibial vessel on the right side. a PTA and stent placement was done to the right anterior tibial artery. 08/19/2015 -- she has broken out with some linear ulceration in the region of her webspaces of her toes and this is something new. 08/26/2015 -- over the last 3 days there was a streak of redness going from her lower extremity towards her toes but she had no fever or discharge from the wounds. 09/02/2015 -- the redness and cellulitis has gone down but she continues to have a lot of edema of the right lower extremity. 10/28/2015 -- over the last week she has been draining a lot of fluid from her right lower extremities and several of the wounds which had healed in the past and now opened up again 11/06/2015 -- she was seen in the vascular office and the right ABI was more than 1.3 which was consistent with noncompressible arteries due to medial calcification and the right great toe pressure was 0.24 and the PPG waveform showed significant decrease. from talking to the family member I believe no procedure has been planned. She was also seen by her PCP was increased her Lasix to 80 mg a day and has ordered some lab work but these reports are not back. 11/20/2015 -- Recent labs done hemoglobin A1c was 6.2, INR was .93,  iron-binding capacity was 310, BMP was within normal limits except for the BUN which was 40 and albumin was 3.1. Her hemoglobin is up to 10.7 with a hematocrit of 34.2 he was advised lymphedema pumps by Dr. Gilda Crease and she has not been wearing these. Her daughter-in- law also says that she has been very noncompliant about elevation of her limbs. Electronic Signature(s) Signed: 12/04/2015 11:01:42 AM By: Evlyn Kanner MD,  FACS Entered By: Evlyn Kanner on 12/04/2015 11:01:42 [name] ([id number] [place], Eyvonne Left ([id number]) -------------------------------------------------------------------------------- Physical Exam Details Patient Name: [name] Date of Service: 12/04/2015 10:00 AM Medical Record Number: [id number] Patient Account Number: [id number] Date of Birth/Sex: Feb 20, 1921 (82 y.o. Female) Treating RN: Curtis Sites Primary Care Physician: Dorothey Baseman Other Clinician: Referring Physician: Dorothey Baseman Treating Physician/Extender: Rudene Re in Treatment: 42 Constitutional . Pulse regular. Respirations normal and unlabored. Afebrile. . Eyes Nonicteric. Reactive to light. Ears, Nose, Mouth, and Throat Lips, teeth, and gums WNL.Marland Kitchen Moist mucosa without lesions. Neck supple and nontender. No palpable supraclavicular or cervical adenopathy. Normal sized without goiter. Respiratory WNL. No retractions.. Cardiovascular Pedal Pulses WNL. No clubbing, cyanosis or edema. Lymphatic No adneopathy. No adenopathy. No adenopathy. Musculoskeletal Adexa without tenderness or enlargement.. Digits and nails w/o clubbing, cyanosis, infection, petechiae, ischemia, or inflammatory conditions.. Integumentary (Hair, Skin) No suspicious lesions. No crepitus or fluctuance. No peri-wound warmth or erythema. No masses.Marland Kitchen Psychiatric Judgement and insight Intact.. No evidence of depression, anxiety, or agitation.. Notes the right lower extremity is looking much worse than the last time I saw her with increased lymphedema and a lot of subcutaneous debris which I can only washout with moist saline and is too tender for any debridement. He is also edema for foot and cellulitis which is new. Electronic Signature(s) Signed: 12/04/2015 11:03:51 AM By: Evlyn Kanner MD, FACS Entered By: Evlyn Kanner on 12/04/2015 11:03:51 [name]  ([id number]) -------------------------------------------------------------------------------- Physician Orders Details Patient Name: [name] Date of Service: 12/04/2015 10:00 AM Medical Record Number: [id number] Patient Account Number: [id number] Date of Birth/Sex: [birth year] (82 y.o. Female) Treating RN: Curtis Sites Primary Care Physician: Dorothey Baseman Other Clinician: Referring Physician: Dorothey Baseman Treating Physician/Extender: Rudene Re in Treatment: 67 Verbal / Phone Orders: Yes Clinician: Curtis Sites Read Back and Verified: Yes Diagnosis Coding Wound Cleansing Wound #6 Right,Posterior Lower Leg o Cleanse wound with mild soap and water o May Shower, gently pat wound dry prior to applying new dressing. Wound #9 Right,Anterior Lower Leg o Cleanse wound with mild soap and water o May Shower, gently pat wound dry prior to applying new dressing. Anesthetic Wound #6 Right,Posterior Lower Leg o Topical Lidocaine 4% cream applied to wound bed prior to debridement Wound #9 Right,Anterior Lower Leg o Topical Lidocaine 4% cream applied to wound bed prior to debridement Primary Wound Dressing Wound #6 Right,Posterior Lower Leg o Aquacel Ag Wound #9 Right,Anterior Lower Leg o Aquacel Ag Secondary Dressing Wound #6 Right,Posterior Lower Leg o Gauze, ABD and Kerlix/Conform o XtraSorb - where needed Wound #9 Right,Anterior Lower Leg o Gauze, ABD and Kerlix/Conform o XtraSorb - where needed Dressing Change Frequency Wound #6 Right,Posterior Lower Leg o Change dressing every day. Blase, Jailey ([id number]) Wound #9 Right,Anterior Lower Leg o Change dressing every day. Follow-up Appointments Wound #6 Right,Posterior Lower Leg o Return Appointment in 2 weeks. Wound #9 Right,Anterior Lower Leg o Return Appointment in 2 weeks. Home Health Wound #6 Right,Posterior Lower Leg o Continue Home Health Visits - HHRN may use  aquacel ag between toes if needed  for drainage o Home Health Nurse may visit PRN to address patientos wound care needs. o FACE TO FACE ENCOUNTER: MEDICARE and MEDICAID PATIENTS: I certify that this patient is under my care and that I had a face-to-face encounter that meets the physician face-to-face encounter requirements with this patient on this date. The encounter with the patient was in whole or in part for the following MEDICAL CONDITION: (primary reason for Home Healthcare) MEDICAL NECESSITY: I certify, that based on my findings, NURSING services are a medically necessary home health service. HOME BOUND STATUS: I certify that my clinical findings support that this patient is homebound (i.e., Due to illness or injury, pt requires aid of supportive devices such as crutches, cane, wheelchairs, walkers, the use of special transportation or the assistance of another person to leave their place of residence. There is a normal inability to leave the home and doing so requires considerable and taxing effort. Other absences are for medical reasons / religious services and are infrequent or of short duration when for other reasons). o If current dressing causes regression in wound condition, may D/C ordered dressing product/s and apply Normal Saline Moist Dressing daily until next Wound Healing Center / Other MD appointment. Notify Wound Healing Center of regression in wound condition at [phone]. o Please direct any NON-WOUND related issues/requests for orders to patient's Primary Care Physician Wound #9 Right,Anterior Lower Leg o Continue Home Health Visits - HHRN may use aquacel ag between toes if needed for drainage o Home Health Nurse may visit PRN to address patientos wound care needs. o FACE TO FACE ENCOUNTER: MEDICARE and MEDICAID PATIENTS: I certify that this patient is under my care and that I had a face-to-face encounter that meets the physician face-to-face encounter  requirements with this patient on this date. The encounter with the patient was in whole or in part for the following MEDICAL CONDITION: (primary reason for Home Healthcare) MEDICAL NECESSITY: I certify, that based on my findings, NURSING services are a medically necessary home health service. HOME BOUND STATUS: I certify that my clinical findings support that this patient is homebound (i.e., Due to illness or injury, pt requires aid of supportive devices such as crutches, cane, wheelchairs, walkers, the use of special transportation or the assistance of another person to leave their place of residence. There is a normal inability to leave the home and doing so requires considerable and taxing effort. Other absences are for medical reasons / religious services and are infrequent or of short duration when for other reasons). ALCALDE, Lailyn ([id number]) o If current dressing causes regression in wound condition, may D/C ordered dressing product/s and apply Normal Saline Moist Dressing daily until next Wound Healing Center / Other MD appointment. Notify Wound Healing Center of regression in wound condition at [phone]. o Please direct any NON-WOUND related issues/requests for orders to patient's Primary Care Physician Patient Medications Allergies: cephalexin, tramadol, amoxicillin, prednisolone Notifications Medication Indication Start End doxycycline hyclate 12/04/2015 DOSE 1 - oral 100 mg capsule - 1 capsule oral bid Electronic Signature(s) Signed: 12/04/2015 11:01:08 AM By: Evlyn Kanner MD, FACS Entered By: Evlyn Kanner on 12/04/2015 11:01:08 [name] ([id number]) -------------------------------------------------------------------------------- Problem List Details Patient Name: [name] Date of Service: 12/04/2015 10:00 AM Medical Record Number: [id number] Patient Account Number: [id number] Date of Birth/Sex: June 27, 1921 (82 y.o. 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 with 02/11/2015 Yes fat layer exposed L97.812 Non-pressure chronic ulcer of other part of right lower leg 02/11/2015 Yes with fat layer exposed T81.31XA Disruption of external operation (surgical) wound, not 02/18/2015 Yes elsewhere classified, initial encounter Inactive Problems Resolved Problems Electronic Signature(s) Signed: 12/04/2015 11:01:26 AM By: Evlyn Kanner MD, FACS Entered By: Evlyn Kanner on 12/04/2015 11:01:26 [name] ([id number]) -------------------------------------------------------------------------------- Progress Note Details Patient Name: [name] Date of Service: 12/04/2015 10:00 AM Medical Record Number: [id number] Patient Account Number: [id number] Date of Birth/Sex: 1921-03-14 (82 y.o. Female) Treating RN: Curtis Sites Primary Care Physician: Dorothey Baseman Other Clinician: Referring Physician: Dorothey Baseman Treating Physician/Extender: Rudene Re in Treatment: 74 Subjective Chief Complaint Information obtained from Patient Patient presents to the wound care center for a consult due non healing wound. 82 year old patient with comes with a history of a ulcerated area to the right third toe and the right heel which she's had for about 2 months. History of Present Illness (HPI) The following HPI elements were documented for the patient's wound: Location: right medial heel and right third toe Quality:  Patient reports experiencing a dull pain to affected area(s). Severity: Patient states wound are getting worse. Duration: Patient has had the wound for > 3 months prior to seeking treatment at the wound center Timing: Pain in wound is Intermittent (comes and goes Context: The wound appeared gradually over time Modifying Factors: Consults to this date include:vascular surgeon and several recent surgeries. Associated Signs and Symptoms: Patient reports having foul odor and drainage from the left below-knee amputation site. A pleasant 82 year old patient who is known to have diabetes mellitus for several years has recently gone through a series of operations with the vascular surgeon Dr. Gilda Crease. In January and February she had several surgeries on the left lower extremity with an attempt to have limb salvage for gangrenous changes of her forefoot. 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 On 01/07/2015 she also had a right lower extremity distal runoff with a angioplasty of the right anterior tibial artery to maximize her blood flow to the foot. She recently had her staples removed at Dr. Marijean Heath office on 01/31/2015 and at that time a right lower extremity duplex was done which showed patent vessels and a patent stent with distal occluded posterior tibial artery. Though the duplex was noncritical the recommendations from the PA at the vascular surgery office was that of a angiogram to be done but the patient said she would rather try some bone care before. The patient has received doxycycline and Cipro in the recent past and she takes oral medications for her diabetes. Other than that I reviewed her list of all her medications. No recent hemoglobin A1c has been done and no recent x-rays of the right foot have been done. 02/18/2015 -- x-ray of the right foot was done on 02/11/2015 and it shows no evidence of  acute osteomyelitis of the third toe or of the calcaneus. She had gone to the vascular surgery office and they had noted that there is dehiscence of the left part of the amputation site of the below-knee wound and they have asked [place] to kindly take  over the care of this. There've been doing dressings for the right heel and right third toe. SHAKINA, CHOY ([id number]) 02/25/2015 -- we have received notes from the vascular group who saw her last on 02/13/2015 and she was seen by the PA Ms. Cleda Daub. She had recommended that the patient continue to follow with [place] for wound care including management of the left BKA stump, which has had dehiscence of the lateral part. They will consider repeating a arterial duplex or angiogram if the right lower extremity does not heal within a reasonable period of time. 03/18/2015 -- he saw the vascular surgeon Dr. Gilda Crease and he has set her up for an angioplasty sometime in the middle of July. she is doing well otherwise. 04/10/2015 -- the patient had a procedure done on 04/08/2015 and this was a right angioplasty of the dorsalis pedis, anterior tibial artery, first superficial femoral artery in its midportion. There was successful intervention with recanalization and in-line flow to the right foot. 04/22/2015 -- the patient was looking rather pale today and the daughter confirms that her hemoglobin was down to 6.9. she is being monitored by her PCP. Her Lasix dose was increased and her edema has gone down significantly. 04/29/2015 -- she had vascular studies done and the ABI on the right is 1.3 and the toe pressures were within normal limits. Her hemoglobin is still around 7 and she refuses to take any blood transfusions for religious reasons. Her potassium was normal. 06/10/2015 -- she has a new blister on her right lateral and posterior part of her leg just in the region where the Kerlix bandages were applied and this may be due to an  abrasion. 06/24/2015 -- On her right lower extremity she has developed several more blisters which look like small pustules and the drain informed shallow ulcerations. 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 07/01/2015 -- she has an appointment with the PCP this Friday and her vascular surgeon on Monday. Other than that she is on doxycycline and has finished 1 week of treatment. The pustules she had all over have now resolved. 07/08/2015 -- she saw the vascular surgeon who did studies in the office and found that there is poor circulation in the distal lower right leg and has set her up for an angiogram and possible stenting next Tuesday. 07/22/2015 -- on October 18 she was taken up for a successful revascularization of the anterior tibial vessel on the right side. a PTA and stent placement was done to the right anterior tibial artery. 08/19/2015 -- she has broken out with some linear ulceration in the region of her webspaces of her toes and this is something new. 08/26/2015 -- over the last 3 days there was a streak of redness going from her lower extremity towards her toes but she had no fever or discharge from the wounds. 09/02/2015 -- the redness and cellulitis has gone down but she continues to have a lot of edema of the right lower extremity. 10/28/2015 -- over the last week she has been draining a lot of fluid from her right lower extremities and several of the wounds which had healed in the past and now opened up again 11/06/2015 -- she was seen in the vascular office and the right ABI was more than 1.3 which was consistent with noncompressible arteries due to medial calcification and the right great toe pressure was 0.24 and the PPG waveform showed significant decrease. from talking to the family member I believe no procedure has been  planned. She was also seen by her PCP was increased her Lasix to 80 mg a day and has ordered some lab work but these reports are not back. [name] NationsSILETZKY, Shamanda  ([id number]) 11/20/2015 -- Recent labs done hemoglobin A1c was 6.2, INR was .93, iron-binding capacity was 310, BMP was within normal limits except for the BUN which was 40 and albumin was 3.1. Her hemoglobin is up to 10.7 with a hematocrit of 34.2 he was advised lymphedema pumps by Dr. Gilda CreaseSchnier and she has not been wearing these. Her daughter-in- law also says that she has been very noncompliant about elevation of her limbs. Objective Constitutional Pulse regular. Respirations normal and unlabored. Afebrile. Vitals Time Taken: 10:05 AM, Height: 62 in, Weight: 155 lbs, BMI: 28.3, Temperature: 97.8 F, Pulse: 54 bpm, Respiratory Rate: 18 breaths/min, Blood Pressure: 115/53 mmHg. Eyes Nonicteric. Reactive to light. Ears, Nose, Mouth, and Throat Lips, teeth, and gums WNL.Marland Kitchen. Moist mucosa without lesions. Neck supple and nontender. No palpable supraclavicular or cervical adenopathy. Normal sized without goiter. Respiratory WNL. No retractions.. Cardiovascular Pedal Pulses WNL. No clubbing, cyanosis or edema. Lymphatic No adneopathy. No adenopathy. No adenopathy. Musculoskeletal Adexa without tenderness or enlargement.. Digits and nails w/o clubbing, cyanosis, infection, petechiae, ischemia, or inflammatory conditions.Marland Kitchen. Psychiatric Judgement and insight Intact.. No evidence of depression, anxiety, or agitation.. General Notes: the right lower extremity is looking much worse than the last time I saw her with increased lymphedema and a lot of subcutaneous debris which I can only washout with moist saline and is too tender for any debridement. He is also edema for foot and cellulitis which is new. Venable, Orville ([id number]) Integumentary (Hair, Skin) No suspicious lesions. No crepitus or fluctuance. No peri-wound warmth or erythema. No masses.. Wound #6 status is Open. Original cause of wound was Blister. The wound is located on the Right,Posterior Lower Leg. The wound measures 15cm length x  14cm width x 0.3cm depth; 164.934cm^2 area and 49.48cm^3 volume. The wound is limited to skin breakdown. There is no tunneling or undermining noted. There is a large amount of serous drainage noted. The wound margin is flat and intact. There is small (1-33%) pink granulation within the wound bed. There is a large (67-100%) amount of necrotic tissue within the wound bed including Adherent Slough. The periwound skin appearance exhibited: Localized Edema, Moist. The periwound skin appearance did not exhibit: Callus, Crepitus, Excoriation, Fluctuance, Friable, Induration, Rash, Scarring, Dry/Scaly, Maceration, Atrophie Blanche, Cyanosis, Ecchymosis, Hemosiderin Staining, Mottled, Pallor, Rubor, Erythema. Periwound temperature was noted as No Abnormality. The periwound has tenderness on palpation. Wound #9 status is Open. Original cause of wound was Gradually Appeared. The wound is located on the Right,Anterior Lower Leg. The wound measures 0.5cm length x 0.4cm width x 0.1cm depth; 0.157cm^2 area and 0.016cm^3 volume. The wound is limited to skin breakdown. There is no tunneling or undermining noted. There is a large amount of serous drainage noted. The wound margin is flat and intact. There is large (67-100%) pink granulation within the wound bed. There is a small (1-33%) amount of necrotic tissue within the wound bed including Adherent Slough. The periwound skin appearance exhibited: Localized Edema, Maceration, Moist, Erythema. The periwound skin appearance did not exhibit: Callus, Crepitus, Excoriation, Fluctuance, Friable, Induration, Rash, Scarring, Dry/Scaly, Atrophie Blanche, Cyanosis, Ecchymosis, Hemosiderin Staining, Mottled, Pallor, Rubor. The surrounding wound skin color is noted with erythema which is circumferential. The periwound has tenderness on palpation. 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 Wound #9 Right,Anterior Lower Leg: Cleanse wound with mild soap and water May Shower, gently pat wound dry prior to applying new dressing. Anesthetic: Wound #6 Right,Posterior Lower Leg: Topical Lidocaine 4% cream applied to wound bed prior to debridement Wound #9 Right,Anterior Lower Leg: Topical Lidocaine 4% cream applied to wound bed prior to debridement Primary Wound Dressing: Wound #6 Right,Posterior Lower Leg: Aquacel Ag Wound #9 Right,Anterior Lower Leg: Aquacel Ag Secondary Dressing: Wound #6 Right,Posterior Lower Leg: Gauze, ABD and Kerlix/Conform XtraSorb - where needed Wound #9 Right,Anterior Lower Leg: Gauze, ABD and Kerlix/Conform XtraSorb - where needed Dressing Change Frequency: Wound #6 Right,Posterior Lower Leg: Change dressing every day. Wound #9 Right,Anterior Lower Leg: Change dressing every day. Follow-up Appointments: Wound #6 Right,Posterior Lower Leg: Return Appointment in 2 weeks. Wound #9 Right,Anterior Lower Leg: Return Appointment in 2 weeks. Home Health: Wound #6 Right,Posterior Lower Leg: Continue Home Health Visits - HHRN may use aquacel ag between toes if needed for drainage Home Health Nurse may visit PRN to address patient s wound care needs. FACE TO FACE ENCOUNTER: MEDICARE and MEDICAID PATIENTS: I certify that this patient is under my care and that I had a  face-to-face encounter that meets the physician face-to-face encounter requirements with this patient on this date. The encounter with the patient was in whole or in part for the following MEDICAL CONDITION: (primary reason for Home Healthcare) MEDICAL NECESSITY: I certify, that based on my findings, NURSING services are a medically necessary home health service. HOME BOUND STATUS: I certify that my clinical findings support that this patient is homebound (i.e., Due to illness or injury, pt requires aid of supportive devices such as crutches, cane, wheelchairs, walkers, the use of special transportation or the assistance of another person to leave their place of residence. There is a normal inability to leave the home and doing so requires considerable and taxing effort. Other absences are for medical reasons / religious services and are infrequent or of short duration when for other reasons). If current dressing causes regression in wound condition, may D/C ordered dressing product/s and apply Normal Saline Moist Dressing daily until next Wound Healing Center / Other MD appointment. Notify Wound Healing Center of regression in wound condition at [phone]. Please direct any NON-WOUND related issues/requests for orders to patient's Primary Care Physician Wound #9 Right,Anterior Lower Leg: Continue Home Health Visits - HHRN may use aquacel ag between toes if needed for drainage FERNANDEZ, Mahlet ([id number]) Home Health Nurse may visit PRN to address patient s wound care needs. FACE TO FACE ENCOUNTER: MEDICARE and MEDICAID PATIENTS: I certify that this patient is under my care and that I had a face-to-face encounter that meets the physician face-to-face encounter requirements with this patient on this date. The encounter with the patient was in whole or in part for the following MEDICAL CONDITION: (primary reason for Home Healthcare) MEDICAL NECESSITY: I certify, that based on my findings,  NURSING services  are a medically necessary home health service. HOME BOUND STATUS: I certify that my clinical findings support that this patient is homebound (i.e., Due to illness or injury, pt requires aid of supportive devices such as crutches, cane, wheelchairs, walkers, the use of special transportation or the assistance of another person to leave their place of residence. There is a normal inability to leave the home and doing so requires considerable and taxing effort. Other absences are for medical reasons / religious services and are infrequent or of short duration when for other reasons). If current dressing causes regression in wound condition, may D/C ordered dressing product/s and apply Normal Saline Moist Dressing daily until next Wound Healing Center / Other MD appointment. Notify Wound Healing Center of regression in wound condition at [phone]. Please direct any NON-WOUND related issues/requests for orders to patient's Primary Care Physician The following medication(s) was prescribed: doxycycline hyclate oral 100 mg capsule 1 1 capsule oral bid starting 12/04/2015 I have recommended: 1. silver alginate to be applied over all the raw surfaces and wraped lightly with Kerlix gauze to keep it in place. She will also have Aquacel placed between the toes. 2. Doxycycline 100 Mg Twice a Day for 14 Days Empirically 3. see Dr. Gilda Crease back for review as I believe there may be a element of arterial insufficiency which is worsened. 4. I have encouraged her to elevate her limb as much as possible and also use her lymphedema pumps which have been prescribed for her by her vascular surgeon. Electronic Signature(s) Signed: 12/04/2015 11:05:52 AM By: Evlyn Kanner MD, FACS Entered By: Evlyn Kanner on 12/04/2015 11:05:52 [name] ([id number]) -------------------------------------------------------------------------------- SuperBill Details Patient Name: [name] Date of  Service: 12/04/2015 Medical Record Number: [id number] Patient Account Number: [id number] Date of Birth/Sex: 08/22/1921 (82 y.o. 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 L97.812 Non-pressure chronic ulcer of other part of right lower leg with fat layer exposed Disruption of external operation (surgical) wound, not elsewhere classified, initial T81.31XA encounter Facility Procedures CPT4 Code: [id number] Description: 99214 - WOUND CARE VISIT-LEV 4 EST PT Modifier: Quantity: 1 Physician Procedures CPT4: Description Modifier Quantity Code [id number] 99213 - WC PHYS LEVEL 3 - EST PT 1 ICD-10 Description Diagnosis E11.621 Type 2 diabetes mellitus with foot ulcer I70.235 Atherosclerosis of native arteries of right leg with ulceration of other part of  foot Z89.512 Acquired absence of left leg below knee L97.812 Non-pressure chronic ulcer of other part of right lower leg with fat layer exposed Electronic Signature(s) Signed: 12/04/2015 11:06:08 AM By: Evlyn Kanner MD, FACS Entered By: Evlyn Kanner on 12/04/2015 11:06:08

## 2015-12-05 NOTE — Progress Notes (Signed)
[name], [name] ([id number]) Visit Report for 12/04/2015 Arrival Information Details Patient Name: [name], [name] Date of Service: 12/04/2015 10:00 AM Medical Record Number: [id number] Patient Account Number: [id number] Date of Birth/Sex: January 06, 1921 (82 y.o. Female) Treating RN: Curtis Sites Primary Care Physician: Dorothey Baseman Other Clinician: Referring Physician: Dorothey Baseman Treating Physician/Extender: Rudene Re in Treatment: 80 Visit Information History Since Last Visit Added or deleted any medications: No Patient Arrived: Wheel Chair Any new allergies or adverse reactions: No Arrival Time: 09:59 Had a fall or experienced change in No activities of daily living that may affect Accompanied By: dtr risk of falls: Transfer Assistance: Manual Signs or symptoms of abuse/neglect since last No Patient Identification Verified: Yes visito Secondary Verification Process Yes Hospitalized since last visit: No Completed: Pain Present Now: Yes Patient Has Alerts: Yes Electronic Signature(s) Signed: 12/04/2015 5:35:02 PM By: Curtis Sites Entered By: Curtis Sites on 12/04/2015 10:02:26 [name] ([id number]) -------------------------------------------------------------------------------- Clinic Level of Care Assessment Details Patient Name: [name] Date of Service: 12/04/2015 10:00 AM Medical Record Number: [id number] Patient Account Number: [id number] Date of Birth/Sex: Feb 12, 1921 (82 y.o. Female) Treating RN: Curtis Sites Primary Care Physician: Terance Hart, DAVID Other Clinician: Referring Physician: Terance Hart, DAVID Treating Physician/Extender: Rudene Re in Treatment: 42 Clinic Level of Care Assessment Items TOOL 4 Quantity Score  - Use when only an EandM is performed on FOLLOW-UP visit 0 ASSESSMENTS - Nursing Assessment / Reassessment X - Reassessment of Co-morbidities (includes updates in patient status) 1 10 X - Reassessment of  Adherence to Treatment Plan 1 5 ASSESSMENTS - Wound and Skin Assessment / Reassessment  - Simple Wound Assessment / Reassessment - one wound 0 X - Complex Wound Assessment / Reassessment - multiple wounds 2 5  - Dermatologic / Skin Assessment (not related to wound area) 0 ASSESSMENTS - Focused Assessment  - Circumferential Edema Measurements - multi extremities 0  - Nutritional Assessment / Counseling / Intervention 0 X - Lower Extremity Assessment (monofilament, tuning fork, pulses) 1 5 X - Peripheral Arterial Disease Assessment (using hand held doppler) 1 10 ASSESSMENTS - Ostomy and/or Continence Assessment and Care  - Incontinence Assessment and Management 0  - Ostomy Care Assessment and Management (repouching, etc.) 0 PROCESS - Coordination of Care X - Simple Patient / Family Education for ongoing care 1 15  - Complex (extensive) Patient / Family Education for ongoing care 0  - Haematologist obtains Chiropractor, Records, Test Results / Process Orders 0  - Staff telephones HHA, Nursing Homes / Clarify orders / etc 0  - Routine Transfer to another Facility (non-emergent condition) 0 [name], [name] ([id number])  - Routine Hospital Admission (non-emergent condition) 0  - New Admissions / Manufacturing engineer / Ordering NPWT, Apligraf, etc. 0  - Emergency Hospital Admission (emergent condition) 0 X - Simple Discharge Coordination 1 10  - Complex (extensive) Discharge Coordination 0 PROCESS - Special Needs  - Pediatric / Minor Patient Management 0  - Isolation Patient Management 0  - Hearing / Language / Visual special needs 0  - Assessment of Community assistance (transportation, D/C planning, etc.) 0  - Additional assistance / Altered mentation 0  - Support Surface(s) Assessment (bed, cushion, seat, etc.) 0 INTERVENTIONS - Wound Cleansing / Measurement  - Simple Wound Cleansing - one wound 0 X - Complex Wound Cleansing - multiple wounds 2 5 X -  Wound Imaging (photographs - any number of wounds) 1 5  - Wound Tracing (instead of photographs) 0  - Simple Wound Measurement - one wound 0  X - Complex Wound Measurement - multiple wounds 2 5 INTERVENTIONS - Wound Dressings  - Small Wound Dressing one or multiple wounds 0 X - Medium Wound Dressing one or multiple wounds 2 15  - Large Wound Dressing one or multiple wounds 0  - Application of Medications - topical 0  - Application of Medications - injection 0 INTERVENTIONS - Miscellaneous  - External ear exam 0 [name], [name] ([id number])  - Specimen Collection (cultures, biopsies, blood, body fluids, etc.) 0  - Specimen(s) / Culture(s) sent or taken to Lab for analysis 0  - Patient Transfer (multiple staff / Michiel Sites Lift / Similar devices) 0  - Simple Staple / Suture removal (25 or less) 0  - Complex Staple / Suture removal (26 or more) 0  - Hypo / Hyperglycemic Management (close monitor of Blood Glucose) 0  - Ankle / Brachial Index (ABI) - do not check if billed separately 0 X - Vital Signs 1 5 Has the patient been seen at the hospital within the last three years: Yes Total Score: 125 Level Of Care: New/Established - Level 4 Electronic Signature(s) Signed: 12/04/2015 5:35:02 PM By: Curtis Sites Entered By: Curtis Sites on 12/04/2015 10:47:36 [name] ([id number]) -------------------------------------------------------------------------------- Encounter Discharge Information Details Patient Name: [name] Date of Service: 12/04/2015 10:00 AM Medical Record Number: [id number] Patient Account Number: [id number] Date of Birth/Sex: 10-29-1920 (82 y.o. Female) Treating RN: Curtis Sites Primary Care Physician: Dorothey Baseman Other Clinician: Referring Physician: Dorothey Baseman Treating Physician/Extender: Rudene Re in Treatment: 67 Encounter Discharge Information Items Discharge Pain Level: 0 Discharge Condition:  Stable Ambulatory Status: Wheelchair Discharge Destination: Home Transportation: Private Auto Accompanied By: dtr Schedule Follow-up Appointment: Yes Medication Reconciliation completed No and provided to Patient/Care Lateshia Schmoker: Provided on Clinical Summary of Care: 12/04/2015 Form Type Recipient Paper Patient HS Electronic Signature(s) Signed: 12/04/2015 11:02:15 AM By: Francie Massing Entered By: Francie Massing on 12/04/2015 11:02:14 [name] ([id number]) -------------------------------------------------------------------------------- Lower Extremity Assessment Details Patient Name: [name] Date of Service: 12/04/2015 10:00 AM Medical Record Number: [id number] Patient Account Number: [id number] Date of Birth/Sex: [birth year] (82 y.o. Female) Treating RN: Curtis Sites Primary Care Physician: Dorothey Baseman Other Clinician: Referring Physician: Dorothey Baseman Treating Physician/Extender: Rudene Re in Treatment: 42 Edema Assessment Assessed: [Left: No] [Right: No] Edema: [Left: Ye] [Right: s] Vascular Assessment Pulses: Posterior Tibial Dorsalis Pedis Palpable: [Right:No] Doppler: [Right:Monophasic] Extremity colors, hair growth, and conditions: Extremity Color: [Right:Pale] Hair Growth on Extremity: [Right:No] Temperature of Extremity: [Right:Cool] Capillary Refill: [Right:< 3 seconds] Electronic Signature(s) Signed: 12/04/2015 5:35:02 PM By: Curtis Sites Entered By: Curtis Sites on 12/04/2015 10:14:07 [name] ([id number]) -------------------------------------------------------------------------------- Multi Wound Chart Details Patient Name: [name] Date of Service: 12/04/2015 10:00 AM Medical Record Number: [id number] Patient Account Number: [id number] Date of Birth/Sex: September 12, 1921 (82 y.o. 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] [9:the Lower Extremity] Comorbid History: [6:Arrhythmia, Deep Vein Thrombosis, Peripheral Arterial Disease, Type II Arterial Disease, Type II Diabetes] [9:Arrhythmia, Deep Vein Thrombosis, Peripheral Diabetes] [N/A:N/A] Date Acquired: [6:06/12/2015] [9:08/31/2015] [N/A:N/A] Weeks of Treatment: [6:24] [9:13] [N/A:N/A] Wound Status: [6:Open] [9:Open] [N/A:N/A]  Measurements L x W x D 15x14x0.3 [9:0.5x0.4x0.1] [N/A:N/A] (cm) Area (cm) : [1:324.401][6:164.934] [9:0.157] [N/A:N/A] Volume (cm) : [6:49.48] [9:0.016] [N/A:N/A] % Reduction in Area: [6:-20910.70%] [9:50.00%] [N/A:N/A] % Reduction in Volume: -62532.90% [9:74.60%] [N/A:N/A] Classification: [6:Full Thickness Without Exposed Support Structures] [9:Grade 1] [N/A:N/A] HBO Classification: [6:Grade 1] [9:N/A] [N/A:N/A] Exudate Amount: [6:Large] [9:Large] [N/A:N/A] Exudate Type: [6:Serous] [9:Serous] [N/A:N/A] Exudate Color: [6:amber] [9:amber] [N/A:N/A] Wound Margin: [6:Flat and Intact] [9:Flat and Intact] [N/A:N/A] Granulation Amount: [6:Small (1-33%)] [9:Large (67-100%)] [N/A:N/A] Granulation Quality: [6:Pink] [9:Pink] [N/A:N/A] Necrotic Amount: [6:Large (67-100%)] [9:Small (1-33%)] [N/A:N/A] Exposed Structures: Fascia: No Fascia: No N/A Fat: No Fat: No Tendon: No Tendon: No Muscle: No Muscle: No Joint: No Joint: No Bone: No Bone: No Limited to Skin Limited to Skin Breakdown Breakdown Epithelialization: None None N/A Periwound Skin  Texture: Edema: Yes Edema: Yes N/A Excoriation: No Excoriation: No Induration: No Induration: No Callus: No Callus: No Crepitus: No Crepitus: No Fluctuance: No Fluctuance: No Friable: No Friable: No Rash: No Rash: No Scarring: No Scarring: No Periwound Skin Moist: Yes Maceration: Yes N/A Moisture: Maceration: No Moist: Yes Dry/Scaly: No Dry/Scaly: No Periwound Skin Color: Atrophie Blanche: No Erythema: Yes N/A Cyanosis: No Atrophie Blanche: No Ecchymosis: No Cyanosis: No Erythema: No Ecchymosis: No Hemosiderin Staining: No Hemosiderin Staining: No Mottled: No Mottled: No Pallor: No Pallor: No Rubor: No Rubor: No Erythema Location: N/A Circumferential N/A Temperature: No Abnormality N/A N/A Tenderness on Yes Yes N/A Palpation: Wound Preparation: Ulcer Cleansing: Ulcer Cleansing: N/A Rinsed/Irrigated with Rinsed/Irrigated with Saline Saline Topical Anesthetic Topical Anesthetic Applied: Other: lidocaine Applied: Other: lidocaine 4% 4% Treatment Notes Electronic Signature(s) Signed: 12/04/2015 5:35:02 PM By: Curtis Sitesorthy, Joanna Entered By: Curtis Sitesorthy, Joanna on 12/04/2015 10:46:06 [name] NationsSILETZKY, [name] ([id number]) -------------------------------------------------------------------------------- Multi-Disciplinary Care Plan Details Patient Name: [name] NationsSILETZKY, [name] Date of Service: 12/04/2015 10:00 AM Medical Record Number: [id number] Patient Account Number: [id number] Date of Birth/Sex: 11/20/1920 (82 y.o. Female) Treating RN: Curtis Sitesorthy, Joanna Primary Care Physician: Dorothey BasemanBRONSTEIN, DAVID Other Clinician: Referring Physician: Dorothey BasemanBRONSTEIN, DAVID Treating Physician/Extender: Rudene ReBritto, Errol Weeks in Treatment: 142 Active Inactive Abuse / Safety / Falls / Self Care Management Nursing Diagnoses: Impaired physical mobility Potential for falls Goals: Patient will remain injury free Date Initiated: 02/11/2015 Goal Status: Active Patient/caregiver will verbalize understanding of skin care  regimen Date Initiated: 02/11/2015 Goal Status: Active Patient/caregiver will verbalize/demonstrate measures taken to prevent injury and/or falls Date Initiated: 02/11/2015 Goal Status: Active Patient/caregiver will verbalize/demonstrate understanding of what to do in case of emergency Date Initiated: 02/11/2015 Goal Status: Active Interventions: Assess fall risk on admission and as needed Provide education on fall prevention Provide education on safe transfers Treatment Activities: Patient referred to home care : 12/04/2015 Notes: Nutrition Nursing Diagnoses: Imbalanced nutrition Potential for alteratiion in Nutrition/Potential for imbalanced nutrition Himmelberger, Essa ([id number]) Goals: Patient/caregiver verbalizes understanding of need to maintain therapeutic glucose control per primary care physician Date Initiated: 02/11/2015 Goal Status: Active Patient/caregiver will maintain therapeutic glucose control Date Initiated: 02/11/2015 Goal Status: Active Interventions: Assess HgA1c results as ordered upon admission and as needed Provide education on elevated blood sugars and impact on wound healing Provide education on nutrition Treatment Activities: Education provided on Nutrition : 04/22/2015 Obtain HgA1c : 12/04/2015 Notes: Wound/Skin Impairment Nursing Diagnoses: Impaired tissue integrity Knowledge deficit related to ulceration/compromised skin integrity Goals: Patient/caregiver will verbalize understanding of skin care regimen Date Initiated: 02/11/2015 Goal Status: Active Ulcer/skin breakdown will heal within 14 weeks Date Initiated: 02/11/2015 Goal Status: Active Interventions: Assess patient/caregiver ability to obtain necessary supplies Assess patient/caregiver ability to perform ulcer/skin care  regimen upon admission and as needed Assess ulceration(s) every visit Provide education on ulcer and skin care Treatment Activities: Skin care regimen initiated :  12/04/2015 Topical wound management initiated : 12/04/2015 Notes: [name], [name] ([id number]) Electronic Signature(s) Signed: 12/04/2015 5:35:02 PM By: Curtis Sites Entered By: Curtis Sites on 12/04/2015 10:45:42 [name] ([id number]) -------------------------------------------------------------------------------- Pain Assessment Details Patient Name: [name] Date of Service: 12/04/2015 10:00 AM Medical Record Number: [id number] Patient Account Number: [id number] Date of Birth/Sex: 19-Dec-1920 (82 y.o. Female) Treating RN: Curtis Sites Primary Care Physician: Dorothey Baseman Other Clinician: Referring Physician: Dorothey Baseman Treating Physician/Extender: Rudene Re in Treatment: 42 Active Problems Location of Pain Severity and Description of Pain Patient Has Paino Yes Site Locations Pain Location: Pain in Ulcers With Dressing Change: Yes Duration of the Pain. Constant / Intermittento Constant Pain Management and Medication Current Pain Management: Electronic Signature(s) Signed: 12/04/2015 5:35:02 PM By: Curtis Sites Entered By: Curtis Sites on 12/04/2015 10:05:50 [name] ([id number]) -------------------------------------------------------------------------------- Patient/Caregiver Education Details Patient Name: [name] Date of Service: 12/04/2015 10:00 AM Medical Record Number: [id number] Patient Account Number: [id number] Date of Birth/Gender: 22-Oct-1920 (82 y.o. Female) Treating RN: Curtis Sites Primary Care Physician: Dorothey Baseman Other Clinician: Referring Physician: Dorothey Baseman Treating Physician/Extender: Rudene Re in Treatment: 70 Education Assessment Education Provided To: Patient and Caregiver Education Topics Provided Venous: Handouts: Other: elevation for edema Methods: Explain/Verbal Responses: State content correctly Electronic Signature(s) Signed: 12/04/2015 5:35:02 PM By: Curtis Sites Entered By: Curtis Sites on 12/04/2015 11:00:57 [name] ([id number]) -------------------------------------------------------------------------------- Wound Assessment Details Patient Name: [name] Date of Service: 12/04/2015 10:00 AM Medical Record Number: [id number] Patient Account Number: [id number] Date of Birth/Sex: 15-Jun-1921 (82 y.o. Female) Treating RN: Curtis Sites Primary Care Physician: Dorothey Baseman Other Clinician: Referring Physician: Terance Hart, DAVID Treating Physician/Extender: Rudene Re in Treatment: 42 Wound Status Wound Number: 6 Primary Arterial Insufficiency Ulcer Etiology: Wound Location: Right Lower Leg - Posterior Wound Open Wounding Event: Blister Status: Date Acquired: 06/12/2015 Comorbid Arrhythmia, Deep Vein Thrombosis, Weeks Of Treatment: 24 History: Peripheral Arterial Disease, Type II Clustered Wound: No Diabetes Photos Photo Uploaded By: Curtis Sites on 12/04/2015 13:39:48 Wound Measurements Length: (cm) 15 Width: (cm) 14 Depth: (cm) 0.3 Area: (cm) 164.934 Volume: (cm) 49.48 % Reduction in Area: -20910.7% % Reduction in Volume: -62532.9% Epithelialization: None Tunneling: No Undermining: No Wound Description Full Thickness Without Foul Odor Aft Classification: Exposed Support Structures Diabetic Severity Grade 1 (Wagner): Wound Margin: Flat and Intact Exudate Amount: Large Exudate Type: Serous Exudate Color: amber er Cleansing: No Wound Bed Granulation Amount: Small (1-33%) Exposed Structure [name], [name] ([id number]) Granulation Quality: Pink Fascia Exposed: No Necrotic Amount: Large (67-100%) Fat Layer Exposed: No Necrotic Quality: Adherent Slough Tendon Exposed: No Muscle Exposed: No Joint Exposed: No Bone Exposed: No Limited to Skin Breakdown Periwound Skin Texture Texture Color No Abnormalities Noted: No No Abnormalities Noted: No Callus: No Atrophie Blanche:  No Crepitus: No Cyanosis: No Excoriation: No Ecchymosis: No Fluctuance: No Erythema: No Friable: No Hemosiderin Staining: No Induration: No Mottled: No Localized Edema: Yes Pallor: No Rash: No Rubor: No Scarring: No Temperature / Pain Moisture Temperature: No Abnormality No Abnormalities Noted: No Tenderness on Palpation: Yes Dry / Scaly: No Maceration: No Moist: Yes Wound Preparation Ulcer Cleansing: Rinsed/Irrigated with Saline Topical Anesthetic Applied: Other: lidocaine 4%, Treatment Notes Wound #6 (Right, Posterior Lower Leg) 1. Cleansed with: Clean wound with Normal Saline 2. Anesthetic Topical Lidocaine 4% cream to wound bed prior to debridement 4. Dressing Applied: Aquacel Ag Other  dressing (specify in notes) 5. Secondary Dressing Applied ABD and Kerlix/Conform 7. Secured with Secretary/administrator) Signed: 12/04/2015 5:35:02 PM By: [name], Annis ([id number]) Entered By: Curtis Sites on 12/04/2015 10:44:55 [name] ([id number]) -------------------------------------------------------------------------------- Wound Assessment Details Patient Name: [name] Date of Service: 12/04/2015 10:00 AM Medical Record Number: [id number] Patient Account Number: [id number] Date of Birth/Sex: May 18, 1921 (82 y.o. Female) Treating RN: Curtis Sites Primary Care Physician: Dorothey Baseman Other Clinician: Referring Physician: Terance Hart, DAVID Treating Physician/Extender: Rudene Re in Treatment: 42 Wound Status Wound Number: 9 Primary Diabetic Wound/Ulcer of the Lower Etiology: Extremity Wound Location: Right Lower Leg - Anterior Wound Open Wounding Event: Gradually Appeared Status: Date Acquired: 08/31/2015 Comorbid Arrhythmia, Deep Vein Thrombosis, Weeks Of Treatment: 13 History: Peripheral Arterial Disease, Type II Clustered Wound: No Diabetes Photos Photo Uploaded By: Curtis Sites on 12/04/2015 13:38:53 Wound  Measurements Length: (cm) 0.5 Width: (cm) 0.4 Depth: (cm) 0.1 Area: (cm) 0.157 Volume: (cm) 0.016 % Reduction in Area: 50% % Reduction in Volume: 74.6% Epithelialization: None Tunneling: No Undermining: No Wound Description Classification: Grade 1 Wound Margin: Flat and Intact Exudate Amount: Large Exudate Type: Serous Exudate Color: amber Foul Odor After Cleansing: No Wound Bed Granulation Amount: Large (67-100%) Exposed Structure Granulation Quality: Pink Fascia Exposed: No Necrotic Amount: Small (1-33%) Fat Layer Exposed: No Necrotic Quality: Adherent Slough Tendon Exposed: No [name], [name] ([id number]) Muscle Exposed: No Joint Exposed: No Bone Exposed: No Limited to Skin Breakdown Periwound Skin Texture Texture Color No Abnormalities Noted: No No Abnormalities Noted: No Callus: No Atrophie Blanche: No Crepitus: No Cyanosis: No Excoriation: No Ecchymosis: No Fluctuance: No Erythema: Yes Friable: No Erythema Location: Circumferential Induration: No Hemosiderin Staining: No Localized Edema: Yes Mottled: No Rash: No Pallor: No Scarring: No Rubor: No Moisture Temperature / Pain No Abnormalities Noted: No Tenderness on Palpation: Yes Dry / Scaly: No Maceration: Yes Moist: Yes Wound Preparation Ulcer Cleansing: Rinsed/Irrigated with Saline Topical Anesthetic Applied: Other: lidocaine 4%, Treatment Notes Wound #9 (Right, Anterior Lower Leg) 1. Cleansed with: Clean wound with Normal Saline 2. Anesthetic Topical Lidocaine 4% cream to wound bed prior to debridement 4. Dressing Applied: Aquacel Ag Other dressing (specify in notes) 5. Secondary Dressing Applied ABD and Kerlix/Conform 7. Secured with Secretary/administrator) Signed: 12/04/2015 5:35:02 PM By: Curtis Sites Entered By: Curtis Sites on 12/04/2015 10:45:23 [name] ([id number]) -------------------------------------------------------------------------------- Vitals  Details Patient Name: [name] Date of Service: 12/04/2015 10:00 AM Medical Record Number: [id number] Patient Account Number: [id number] Date of Birth/Sex: 1921/07/24 (82 y.o. Female) Treating RN: Curtis Sites Primary Care Physician: Dorothey Baseman Other Clinician: Referring Physician: Terance Hart, DAVID Treating Physician/Extender: Rudene Re in Treatment: 42 Vital Signs Time Taken: 10:05 Temperature (F): 97.8 Height (in): 62 Pulse (bpm): 54 Weight (lbs): 155 Respiratory Rate (breaths/min): 18 Body Mass Index (BMI): 28.3 Blood Pressure (mmHg): 115/53 Reference Range: 80 - 120 mg / dl Electronic Signature(s) Signed: 12/04/2015 5:35:02 PM By: Curtis Sites Entered By: Curtis Sites on 12/04/2015 10:06:18

## 2015-12-18 ENCOUNTER — Encounter: Payer: Medicare Other | Admitting: Surgery

## 2015-12-18 DIAGNOSIS — E11621 Type 2 diabetes mellitus with foot ulcer: Secondary | ICD-10-CM | POA: Diagnosis not present

## 2015-12-20 NOTE — Progress Notes (Signed)
[name], [name] ([id number]) Visit Report for 12/18/2015 Chief Complaint Document Details Patient Name: [name], [name] Date of Service: 12/18/2015 10:00 AM Medical Record Number: [id number] Patient Account Number: [id number] Date of Birth/Sex: 1921-05-26 (82 y.o. Female) Treating RN: Curtis Sites Primary Care Physician: Dorothey Baseman Other Clinician: Referring Physician: Dorothey Baseman Treating Physician/Extender: Rudene Re in Treatment: 59 Information Obtained from: Patient Chief Complaint Patient presents to the wound care center for a consult due non healing wound. 82 year old patient with comes with a history of a ulcerated area to the right third toe and the right heel which she's had for about 2 months. Electronic Signature(s) Signed: 12/18/2015 10:39:04 AM By: Evlyn Kanner MD, FACS Entered By: Evlyn Kanner on 12/18/2015 10:39:03 [name] ([id number]) -------------------------------------------------------------------------------- HPI Details Patient Name: [name] Date of Service: 12/18/2015 10:00 AM Medical Record Number: [id number] Patient Account Number: [id number] Date of Birth/Sex: [birth year] (82 y.o. Female) Treating RN: Curtis Sites Primary Care Physician: Dorothey Baseman Other Clinician: Referring Physician: Dorothey Baseman Treating Physician/Extender: Rudene Re in Treatment: 25 History of Present Illness Location: right medial heel and right third toe Quality: Patient reports experiencing a dull pain to affected area(s). Severity: Patient states wound are getting worse. Duration: Patient has had the wound for > 3 months prior to seeking treatment at the wound center Timing: Pain in wound is Intermittent (comes and goes Context: The wound appeared gradually over time Modifying Factors: Consults to this date include:vascular surgeon and several recent surgeries. Associated Signs and Symptoms: Patient reports having foul odor  and drainage from the left below-knee amputation site. HPI Description: A pleasant 82 year old patient who is known to have diabetes mellitus for several years has recently gone through a series of operations with the vascular surgeon Dr. Gilda Crease. In January and February she had several surgeries on the left lower extremity with an attempt to have limb salvage for gangrenous changes of her forefoot. 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 On 01/07/2015 she also had a right lower extremity distal runoff with a angioplasty of the right anterior tibial artery to maximize her blood flow to the foot. She recently had her staples removed at Dr. Marijean Heath office on 01/31/2015 and at that time a right lower extremity duplex was done which showed patent vessels and a patent stent with distal occluded posterior tibial artery. Though the duplex was noncritical the recommendations from the PA at the vascular surgery office was that of a angiogram to be done but the patient said she would rather try some bone care before. The patient has received doxycycline and Cipro in the recent past and she takes oral medications for her diabetes. Other than that I reviewed her list of all her medications. No recent hemoglobin A1c has been done and no recent x-rays of the right foot have been done. 02/18/2015 -- x-ray of the right foot was done on 02/11/2015 and it shows no evidence of acute osteomyelitis of the third toe or of the calcaneus. She had gone to the vascular surgery office and they had noted that there is dehiscence of the left part of the amputation site of the below-knee wound and they have asked [place] to kindly take over the care of this. There've been doing dressings for the right heel and right third toe. 02/25/2015 -- we have received notes from the vascular group who saw her last on 02/13/2015 and she was seen by the PA Ms.  Cleda DaubKimberly Stegmayer. She had recommended that the patient continue to follow with us for wound care including management of the left BKA stump, which has had dehiscence of the lateral part. They will consider repeating a arterial duplex or angiogram if the right lower extremity does not heal within a reasonable period of time. 03/18/2015 -- he saw the vascular surgeon Dr. Gilda CreaseSchnier and he has set her up for an angioplasty sometime in the middle of July. she is doing well otherwise. [name] NationsSILETZKY, [name] ([id number]) 04/10/2015 -- the patient had a procedure done on 04/08/2015 and this was a right angioplasty of the dorsalis pedis, anterior tibial artery, first superficial femoral artery in its midportion. There was successful intervention with recanalization and in-line flow to the right foot. 04/22/2015 -- the patient was looking rather pale today and the daughter confirms that her hemoglobin was down to 6.9. she is being monitored by her PCP. Her Lasix dose was increased and her edema has gone down significantly. 04/29/2015 -- she had vascular studies done and the ABI on the right is 1.3 and the toe pressures were within normal limits. Her hemoglobin is still around 7 and she refuses to take any blood transfusions for religious reasons. Her potassium was normal. 06/10/2015 -- she has a new blister on her right lateral and posterior part of her leg just in the region where the Kerlix bandages were applied and this may be due to an abrasion. 06/24/2015 -- On her right lower extremity she has developed several more blisters which look like small pustules and the drain informed shallow ulcerations. 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 07/01/2015 -- she has an appointment with the PCP this Friday and her vascular surgeon on Monday. Other than that she is on doxycycline and has finished 1 week of treatment. The pustules she had all over have now resolved. 07/08/2015 -- she saw the vascular surgeon who did  studies in the office and found that there is poor circulation in the distal lower right leg and has set her up for an angiogram and possible stenting next [name]. 07/22/2015 -- on October 18 she was taken up for a successful revascularization of the anterior tibial vessel on the right side. a PTA and stent placement was done to the right anterior tibial artery. 08/19/2015 -- she has broken out with some linear ulceration in the region of her webspaces of her toes and this is something new. 08/26/2015 -- over the last 3 days there was a streak of redness going from her lower extremity towards her toes but she had no fever or discharge from the wounds. 09/02/2015 -- the redness and cellulitis has gone down but she continues to have a lot of edema of the right lower extremity. 10/28/2015 -- over the last week she has been draining a lot of fluid from her right lower extremities and several of the wounds which had healed in the past and now opened up again 11/06/2015 -- she was seen in the vascular office and the right ABI was more than 1.3 which was consistent with noncompressible arteries due to medial calcification and the right great toe pressure was 0.24 and the PPG waveform showed significant decrease. from talking to the family member I believe no procedure has been planned. She was also seen by her PCP was increased her Lasix to 80 mg a day and has ordered some lab work but these reports are not back. 11/20/2015 -- Recent labs done hemoglobin A1c was 6.2, INR was .93,  iron-binding capacity was 310, BMP was within normal limits except for the BUN which was 40 and albumin was 3.1. Her hemoglobin is up to 10.7 with a hematocrit of 34.2 he was advised lymphedema pumps by Dr. Gilda Crease and she has not been wearing these. Her daughter-in- law also says that she has been very noncompliant about elevation of her limbs. 12/18/2015 -- she was seen by Dr. Gilda Crease who agreed with the management and  did not think she required any intervention. He discussed elevation and use of lymph pump and will see her back as required. [name], [name] ([id number]) Electronic Signature(s) Signed: 12/18/2015 10:40:03 AM By: Evlyn Kanner MD, FACS Entered By: Evlyn Kanner on 12/18/2015 10:40:03 [name] ([id number]) -------------------------------------------------------------------------------- Physical Exam Details Patient Name: [name] Date of Service: 12/18/2015 10:00 AM Medical Record Number: [id number] Patient Account Number: [id number] Date of Birth/Sex: 1920/10/31 (82 y.o. Female) Treating RN: Curtis Sites Primary Care Physician: Dorothey Baseman Other Clinician: Referring Physician: Dorothey Baseman Treating Physician/Extender: Rudene Re in Treatment: 44 Constitutional . Pulse regular. Respirations normal and unlabored. Afebrile. . Eyes Nonicteric. Reactive to light. Ears, Nose, Mouth, and Throat Lips, teeth, and gums WNL.Marland Kitchen Moist mucosa without lesions. Neck supple and nontender. No palpable supraclavicular or cervical adenopathy. Normal sized without goiter. Respiratory WNL. No retractions.. Cardiovascular Pedal Pulses WNL. No clubbing, cyanosis or edema. Lymphatic No adneopathy. No adenopathy. No adenopathy. Musculoskeletal Adexa without tenderness or enlargement.. Digits and nails w/o clubbing, cyanosis, infection, petechiae, ischemia, or inflammatory conditions.. Integumentary (Hair, Skin) No suspicious lesions. No crepitus or fluctuance. No peri-wound warmth or erythema. No masses.Marland Kitchen Psychiatric Judgement and insight Intact.. No evidence of depression, anxiety, or agitation.. Notes her lymphedema has has gotten significantly better and the cellulitis has gone. She continues to have shallow ulcers with some slough but it's too tender to debridement with a curette. I views moist saline gauze and wiped out as much as I could. Electronic  Signature(s) Signed: 12/18/2015 10:40:52 AM By: Evlyn Kanner MD, FACS Entered By: Evlyn Kanner on 12/18/2015 10:40:51 [name] ([id number]) -------------------------------------------------------------------------------- Physician Orders Details Patient Name: [name] Date of Service: 12/18/2015 10:00 AM Medical Record Number: [id number] Patient Account Number: [id number] Date of Birth/Sex: 01-19-1921 (82 y.o. Female) Treating RN: Curtis Sites Primary Care Physician: Dorothey Baseman Other Clinician: Referring Physician: Dorothey Baseman Treating Physician/Extender: Rudene Re in Treatment: 84 Verbal / Phone Orders: Yes Clinician: Curtis Sites Read Back and Verified: Yes Diagnosis Coding Wound Cleansing Wound #6 Right,Posterior Lower Leg o Cleanse wound with mild soap and water o May Shower, gently pat wound dry prior to applying new dressing. Wound #9 Right,Anterior Lower Leg o Cleanse wound with mild soap and water o May Shower, gently pat wound dry prior to applying new dressing. Anesthetic Wound #6 Right,Posterior Lower Leg o Topical Lidocaine 4% cream applied to wound bed prior to debridement Wound #9 Right,Anterior Lower Leg o Topical Lidocaine 4% cream applied to wound bed prior to debridement Primary Wound Dressing Wound #6 Right,Posterior Lower Leg o Aquacel Ag Wound #9 Right,Anterior Lower Leg o Aquacel Ag Secondary Dressing Wound #6 Right,Posterior Lower Leg o Gauze, ABD and Kerlix/Conform o XtraSorb - where needed Wound #9 Right,Anterior Lower Leg o Gauze, ABD and Kerlix/Conform o XtraSorb - where needed Dressing Change Frequency Wound #6 Right,Posterior Lower Leg o Change dressing every day. [name], [name] ([id number]) Wound #9 Right,Anterior Lower Leg o Change dressing every day. Follow-up Appointments Wound #6 Right,Posterior Lower Leg o Return Appointment in 2 weeks. Wound #9 Right,Anterior Lower  Leg o Return Appointment in 2 weeks. Home Health Wound #6 Right,Posterior Lower Leg o Continue Home Health Visits - HHRN may use aquacel ag between toes if needed for drainage o Home Health Nurse may visit PRN to address patientos wound care needs. o FACE TO FACE ENCOUNTER: MEDICARE and MEDICAID PATIENTS: I certify that this patient is under my care and that I had a face-to-face encounter that meets the physician face-to-face encounter requirements with this patient on this date. The encounter with the patient was in whole or in part for the following MEDICAL CONDITION: (primary reason for Home Healthcare) MEDICAL NECESSITY: I certify, that based on my findings, NURSING services are a medically necessary home health service. HOME BOUND STATUS: I certify that my clinical findings support that this patient is homebound (i.e., Due to illness or injury, pt requires aid of supportive devices such as crutches, cane, wheelchairs, walkers, the use of special transportation or the assistance of another person to leave their place of residence. There is a normal inability to leave the home and doing so requires considerable and taxing effort. Other absences are for medical reasons / religious services and are infrequent or of short duration when for other reasons). o If current dressing causes regression in wound condition, may D/C ordered dressing product/s and apply Normal Saline Moist Dressing daily until next Wound Healing Center / Other MD appointment. Notify Wound Healing Center of regression in wound condition at [phone]. o Please direct any NON-WOUND related issues/requests for orders to patient's Primary Care Physician Wound #9 Right,Anterior Lower Leg o Continue Home Health Visits - HHRN may use aquacel ag between toes if needed for drainage o Home Health Nurse may visit PRN to address patientos wound care needs. o FACE TO FACE ENCOUNTER: MEDICARE and MEDICAID  PATIENTS: I certify that this patient is under my care and that I had a face-to-face encounter that meets the physician face-to-face encounter requirements with this patient on this date. The encounter with the patient was in whole or in part for the following MEDICAL CONDITION: (primary reason for Home Healthcare) MEDICAL NECESSITY: I certify, that based on my findings, NURSING services are a medically necessary home health service. HOME BOUND STATUS: I certify that my clinical findings support that this patient is homebound (i.e., Due to illness or injury, pt requires aid of supportive devices such as crutches, cane, wheelchairs, walkers, the use of special transportation or the assistance of another person to leave their place of residence. There is a normal inability to leave the home and doing so requires considerable and taxing effort. Other absences are for medical reasons / religious services and are infrequent or of short duration when for other reasons). [name], [name] ([id number]) o If current dressing causes regression in wound condition, may D/C ordered dressing product/s and apply Normal Saline Moist Dressing daily until next Wound Healing Center / Other MD appointment. Notify Wound Healing Center of regression in wound condition at [phone]. o Please direct any NON-WOUND related issues/requests for orders to patient's Primary Care Physician Electronic Signature(s) Signed: 12/18/2015 3:56:22 PM By: Evlyn Kanner MD, FACS Signed: 12/18/2015 5:19:20 PM By: Curtis Sites Entered By: Curtis Sites on 12/18/2015 10:35:16 [name] ([id number]) -------------------------------------------------------------------------------- Problem List Details Patient Name: [name] Date of Service: 12/18/2015 10:00 AM Medical Record Number: [id number] Patient Account Number: [id number] Date of Birth/Sex: [birth year] (82 y.o. 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 02/11/2015 Yes fat layer exposed L97.812 Non-pressure chronic ulcer of other part of right lower leg 02/11/2015 Yes with fat layer exposed T81.31XA Disruption of external operation (surgical) wound, not 02/18/2015 Yes elsewhere classified, initial encounter Inactive Problems Resolved Problems Electronic Signature(s) Signed: 12/18/2015 10:38:40 AM By: Evlyn Kanner MD, FACS Entered By: Evlyn Kanner on 12/18/2015 10:38:40 [name] ([id number]) -------------------------------------------------------------------------------- Progress Note Details Patient Name: [name] Date of Service: 12/18/2015 10:00 AM Medical Record Number: [id number] Patient Account Number: [id number] Date of Birth/Sex: January 24, 1921 (82 y.o. Female) Treating RN: Curtis Sites Primary Care Physician: Dorothey Baseman Other Clinician: Referring Physician: Dorothey Baseman Treating Physician/Extender: Rudene Re in Treatment: 38 Subjective Chief Complaint Information obtained from Patient Patient presents to the wound care center for a consult due non healing wound. 82 year old patient with comes with a history of a ulcerated area to the right third toe and the right heel which she's had for about 2 months. History of Present Illness (HPI) The following HPI elements were documented for the patient's wound: Location: right medial heel and right third toe Quality: Patient reports  experiencing a dull pain to affected area(s). Severity: Patient states wound are getting worse. Duration: Patient has had the wound for > 3 months prior to seeking treatment at the wound center Timing: Pain in wound is Intermittent (comes and goes Context: The wound appeared gradually over time Modifying Factors: Consults to this date include:vascular surgeon and several recent surgeries. Associated Signs and Symptoms: Patient reports having foul odor and drainage from the left below-knee amputation site. A pleasant 82 year old patient who is known to have diabetes mellitus for several years has recently gone through a series of operations with the vascular surgeon Dr. Gilda Crease. In January and February she had several surgeries on the left lower extremity with an attempt to have limb salvage for gangrenous changes of her forefoot. 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 On 01/07/2015 she also had a right lower extremity distal runoff with a angioplasty of the right anterior tibial artery to maximize her blood flow to the foot. She recently had her staples removed at Dr. Marijean Heath office on 01/31/2015 and at that time a right lower extremity duplex was done which showed patent vessels and a patent stent with distal occluded posterior tibial artery. Though the duplex was noncritical the recommendations from the PA at the vascular surgery office was that of a angiogram to be done but the patient said she would rather try some bone care before. The patient has received doxycycline and Cipro in the recent past and she takes oral medications for her diabetes. Other than that I reviewed her list of all her medications. No recent hemoglobin A1c has been done and no recent x-rays of the right foot have been done. 02/18/2015 -- x-ray of the right foot was done on 02/11/2015 and it shows no evidence of acute osteomyelitis of  the third toe or of the calcaneus. She had gone to the vascular surgery office and they had noted that there is dehiscence of the left part of the amputation site of the below-knee  wound and they have asked [place] to kindly take over the care of this. There've been doing dressings for the right heel and right third toe. [name], [name] ([id number]) 02/25/2015 -- we have received notes from the vascular group who saw her last on 02/13/2015 and she was seen by the PA Ms. Cleda Daub. She had recommended that the patient continue to follow with [place] for wound care including management of the left BKA stump, which has had dehiscence of the lateral part. They will consider repeating a arterial duplex or angiogram if the right lower extremity does not heal within a reasonable period of time. 03/18/2015 -- he saw the vascular surgeon Dr. Gilda Crease and he has set her up for an angioplasty sometime in the middle of July. she is doing well otherwise. 04/10/2015 -- the patient had a procedure done on 04/08/2015 and this was a right angioplasty of the dorsalis pedis, anterior tibial artery, first superficial femoral artery in its midportion. There was successful intervention with recanalization and in-line flow to the right foot. 04/22/2015 -- the patient was looking rather pale today and the daughter confirms that her hemoglobin was down to 6.9. she is being monitored by her PCP. Her Lasix dose was increased and her edema has gone down significantly. 04/29/2015 -- she had vascular studies done and the ABI on the right is 1.3 and the toe pressures were within normal limits. Her hemoglobin is still around 7 and she refuses to take any blood transfusions for religious reasons. Her potassium was normal. 06/10/2015 -- she has a new blister on her right lateral and posterior part of her leg just in the region where the Kerlix bandages were applied and this may be due to an abrasion. 06/24/2015 -- On her right  lower extremity she has developed several more blisters which look like small pustules and the drain informed shallow ulcerations. 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 07/01/2015 -- she has an appointment with the PCP this Friday and her vascular surgeon on Monday. Other than that she is on doxycycline and has finished 1 week of treatment. The pustules she had all over have now resolved. 07/08/2015 -- she saw the vascular surgeon who did studies in the office and found that there is poor circulation in the distal lower right leg and has set her up for an angiogram and possible stenting next [name]. 07/22/2015 -- on October 18 she was taken up for a successful revascularization of the anterior tibial vessel on the right side. a PTA and stent placement was done to the right anterior tibial artery. 08/19/2015 -- she has broken out with some linear ulceration in the region of her webspaces of her toes and this is something new. 08/26/2015 -- over the last 3 days there was a streak of redness going from her lower extremity towards her toes but she had no fever or discharge from the wounds. 09/02/2015 -- the redness and cellulitis has gone down but she continues to have a lot of edema of the right lower extremity. 10/28/2015 -- over the last week she has been draining a lot of fluid from her right lower extremities and several of the wounds which had healed in the past and now opened up again 11/06/2015 -- she was seen in the vascular office and the right ABI was more than 1.3 which was consistent with noncompressible arteries due to medial calcification and the right great toe pressure was 0.24 and the PPG waveform showed significant decrease. from talking to  the family member I believe no procedure has been planned. She was also seen by her PCP was increased her Lasix to 80 mg a day and has ordered some lab work but these reports are not back. [name], [name] ([id number]) 11/20/2015 -- Recent labs  done hemoglobin A1c was 6.2, INR was .93, iron-binding capacity was 310, BMP was within normal limits except for the BUN which was 40 and albumin was 3.1. Her hemoglobin is up to 10.7 with a hematocrit of 34.2 he was advised lymphedema pumps by Dr. Gilda Crease and she has not been wearing these. Her daughter-in- law also says that she has been very noncompliant about elevation of her limbs. 12/18/2015 -- she was seen by Dr. Gilda Crease who agreed with the management and did not think she required any intervention. He discussed elevation and use of lymph pump and will see her back as required. Objective Constitutional Pulse regular. Respirations normal and unlabored. Afebrile. Vitals Time Taken: 10:10 AM, Height: 62 in, Weight: 155 lbs, BMI: 28.3, Temperature: 97.3 F, Pulse: 50 bpm, Respiratory Rate: 16 breaths/min, Blood Pressure: 115/44 mmHg. Eyes Nonicteric. Reactive to light. Ears, Nose, Mouth, and Throat Lips, teeth, and gums WNL.Marland Kitchen Moist mucosa without lesions. Neck supple and nontender. No palpable supraclavicular or cervical adenopathy. Normal sized without goiter. Respiratory WNL. No retractions.. Cardiovascular Pedal Pulses WNL. No clubbing, cyanosis or edema. Lymphatic No adneopathy. No adenopathy. No adenopathy. Musculoskeletal Adexa without tenderness or enlargement.. Digits and nails w/o clubbing, cyanosis, infection, petechiae, ischemia, or inflammatory conditions.Marland Kitchen Psychiatric Judgement and insight Intact.. No evidence of depression, anxiety, or agitation.. General Notes: her lymphedema has has gotten significantly better and the cellulitis has gone. She [name], [name] ([id number]) continues to have shallow ulcers with some slough but it's too tender to debridement with a curette. I views moist saline gauze and wiped out as much as I could. Integumentary (Hair, Skin) No suspicious lesions. No crepitus or fluctuance. No peri-wound warmth or erythema. No masses.. Wound #6  status is Open. Original cause of wound was Blister. The wound is located on the Right,Posterior Lower Leg. The wound measures 14cm length x 13cm width x 0.2cm depth; 142.942cm^2 area and 28.588cm^3 volume. The wound is limited to skin breakdown. There is no tunneling or undermining noted. There is a large amount of serous drainage noted. The wound margin is flat and intact. There is no granulation within the wound bed. There is a large (67-100%) amount of necrotic tissue within the wound bed including Adherent Slough. The periwound skin appearance exhibited: Localized Edema, Moist. The periwound skin appearance did not exhibit: Callus, Crepitus, Excoriation, Fluctuance, Friable, Induration, Rash, Scarring, Dry/Scaly, Maceration, Atrophie Blanche, Cyanosis, Ecchymosis, Hemosiderin Staining, Mottled, Pallor, Rubor, Erythema. Periwound temperature was noted as No Abnormality. The periwound has tenderness on palpation. Wound #9 status is Open. Original cause of wound was Gradually Appeared. The wound is located on the Right,Anterior Lower Leg. The wound measures 0.3cm length x 0.3cm width x 0.1cm depth; 0.071cm^2 area and 0.007cm^3 volume. The wound is limited to skin breakdown. There is no tunneling or undermining noted. There is a small amount of serous drainage noted. The wound margin is flat and intact. There is large (67-100%) pink granulation within the wound bed. There is a small (1-33%) amount of necrotic tissue within the wound bed including Adherent Slough. The periwound skin appearance exhibited: Localized Edema, Maceration, Moist, Erythema. The periwound skin appearance did not exhibit: Callus, Crepitus, Excoriation, Fluctuance, Friable, Induration, Rash, Scarring, Dry/Scaly, Atrophie Blanche, Cyanosis, Ecchymosis, Hemosiderin Staining,  Mottled, Pallor, Rubor. The surrounding wound skin color is noted with erythema which is circumferential. The periwound has tenderness on  palpation. 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 Wound #9 Right,Anterior Lower Leg: Cleanse wound with mild soap and water May Shower, gently pat wound dry prior to applying new dressing. Anesthetic: Wound #6 Right,Posterior Lower Leg: Topical Lidocaine 4% cream applied to wound bed prior to debridement Wound #9 Right,Anterior Lower Leg: Topical Lidocaine 4% cream applied to wound bed prior to debridement Primary Wound Dressing: Wound #6 Right,Posterior Lower Leg: Aquacel Ag Wound #9 Right,Anterior Lower Leg: Aquacel Ag Secondary Dressing: Wound #6 Right,Posterior Lower Leg: Gauze, ABD and Kerlix/Conform XtraSorb - where needed Wound #9 Right,Anterior Lower Leg: Gauze, ABD and Kerlix/Conform XtraSorb - where needed Dressing Change Frequency: Wound #6 Right,Posterior Lower Leg: Change dressing every day. Wound #9 Right,Anterior Lower Leg: Change dressing every day. Follow-up Appointments: Wound #6 Right,Posterior Lower Leg: Return Appointment in 2 weeks. Wound #9 Right,Anterior Lower Leg: Return Appointment in 2 weeks. Home Health: Wound #6 Right,Posterior Lower Leg: Continue Home Health Visits - HHRN may use aquacel ag between toes if needed for drainage Home Health Nurse may visit PRN to address patient s wound  care needs. FACE TO FACE ENCOUNTER: MEDICARE and MEDICAID PATIENTS: I certify that this patient is under my care and that I had a face-to-face encounter that meets the physician face-to-face encounter requirements with this patient on this date. The encounter with the patient was in whole or in part for the following MEDICAL CONDITION: (primary reason for Home Healthcare) MEDICAL NECESSITY: I certify, that based on my findings, NURSING services are a medically necessary home health service. HOME BOUND STATUS: I certify that my clinical findings support that this patient is homebound (i.e., Due to illness or injury, pt requires aid of supportive devices such as crutches, cane, wheelchairs, walkers, the use of special transportation or the assistance of another person to leave their place of residence. There is a normal inability to leave the home and doing so requires considerable and taxing effort. Other absences are for medical reasons / religious services and are infrequent or of short duration when for other reasons). If current dressing causes regression in wound condition, may D/C ordered dressing product/s and apply Normal Saline Moist Dressing daily until next Wound Healing Center / Other MD appointment. Notify Wound [name], [name] ([id number]) Healing Center of regression in wound condition at [phone]. Please direct any NON-WOUND related issues/requests for orders to patient's Primary Care Physician Wound #9 Right,Anterior Lower Leg: Continue Home Health Visits - HHRN may use aquacel ag between toes if needed for drainage Home Health Nurse may visit PRN to address patient s wound care needs. FACE TO FACE ENCOUNTER: MEDICARE and MEDICAID PATIENTS: I certify that this patient is under my care and that I had a face-to-face encounter that meets the physician face-to-face encounter requirements with this patient  on this date. The encounter with the patient was in whole or in part for  the following MEDICAL CONDITION: (primary reason for Home Healthcare) MEDICAL NECESSITY: I certify, that based on my findings, NURSING services are a medically necessary home health service. HOME BOUND STATUS: I certify that my clinical findings support that this patient is homebound (i.e., Due to illness or injury, pt requires aid of supportive devices such as crutches, cane, wheelchairs, walkers, the use of special transportation or the assistance of another person to leave their place of residence. There is a normal inability to leave the home and doing so requires considerable and taxing effort. Other absences are for medical reasons / religious services and are infrequent or of short duration when for other reasons). If current dressing causes regression in wound condition, may D/C ordered dressing product/s and apply Normal Saline Moist Dressing daily until next Wound Healing Center / Other MD appointment. Notify Wound Healing Center of regression in wound condition at [phone]. Please direct any NON-WOUND related issues/requests for orders to patient's Primary Care Physician I have recommended: 1. silver alginate to be applied over all the raw surfaces and wraped lightly with Kerlix gauze to keep it in place. She will also have Aquacel placed between the toes. 2. I have encouraged her to elevate her limb as much as possible and also use her lymphedema pumps which have been prescribed for her by her vascular surgeon. 3. she will come back and see me in about 2 weeks to continue monitoring her palliative care. Electronic Signature(s) Signed: 12/18/2015 10:41:55 AM By: Evlyn KannerBritto, Dezarae Mcclaran MD, FACS Entered By: Evlyn KannerBritto, Aleena Kirkeby on 12/18/2015 10:41:55 [name] NationsSILETZKY, [name] ([id number]) -------------------------------------------------------------------------------- SuperBill Details Patient Name: [name] NationsSILETZKY, [name] Date of Service: 12/18/2015 Medical Record Number: [id number] Patient Account Number:  [id number] Date of Birth/Sex: 11/22/1920 (82 y.o. 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 L97.812 Non-pressure chronic ulcer of other part of right lower leg with fat layer exposed Disruption of external operation (surgical) wound, not elsewhere classified, initial T81.31XA encounter Facility Procedures CPT4 Code: [id number] Description: 99213 - WOUND CARE VISIT-LEV 3 EST PT Modifier: Quantity: 1 Physician Procedures CPT4: Description Modifier Quantity Code [id number] 99213 - WC PHYS LEVEL 3 - EST PT 1 ICD-10 Description Diagnosis E11.621 Type 2 diabetes mellitus with foot ulcer I70.235 Atherosclerosis of native arteries of right leg with ulceration of other part of  foot 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

## 2015-12-20 NOTE — Progress Notes (Signed)
[name], [name] ([id number]) Visit Report for 12/18/2015 Arrival Information Details Patient Name: [name], [name] Date of Service: 12/18/2015 10:00 AM Medical Record Number: [id number] Patient Account Number: [id number] Date of Birth/Sex: 27-Sep-1921 (82 y.o. Female) Treating RN: Curtis Sites Primary Care Physician: Dorothey Baseman Other Clinician: Referring Physician: Dorothey Baseman Treating Physician/Extender: Rudene Re in Treatment: 61 Visit Information History Since Last Visit Added or deleted any medications: No Patient Arrived: Wheel Chair Any new allergies or adverse reactions: No Arrival Time: 10:08 Had a fall or experienced change in No activities of daily living that may affect Accompanied By: dtr risk of falls: Transfer Assistance: Manual Signs or symptoms of abuse/neglect since last No Patient Identification Verified: Yes visito Secondary Verification Process Yes Hospitalized since last visit: No Completed: Pain Present Now: Yes Patient Has Alerts: Yes Electronic Signature(s) Signed: 12/18/2015 5:19:20 PM By: Curtis Sites Entered By: Curtis Sites on 12/18/2015 10:08:38 [name] ([id number]) -------------------------------------------------------------------------------- Clinic Level of Care Assessment Details Patient Name: [name] Date of Service: 12/18/2015 10:00 AM Medical Record Number: [id number] Patient Account Number: [id number] Date of Birth/Sex: 11-12-1920 (82 y.o. Female) Treating RN: Curtis Sites Primary Care Physician: Terance Hart, DAVID Other Clinician: Referring Physician: Terance Hart, DAVID Treating Physician/Extender: Rudene Re in Treatment: 81 Clinic Level of Care Assessment Items TOOL 4 Quantity Score  - Use when only an EandM is performed on FOLLOW-UP visit 0 ASSESSMENTS - Nursing Assessment / Reassessment X - Reassessment of Co-morbidities (includes updates in patient status) 1 10 X - Reassessment of  Adherence to Treatment Plan 1 5 ASSESSMENTS - Wound and Skin Assessment / Reassessment  - Simple Wound Assessment / Reassessment - one wound 0 X - Complex Wound Assessment / Reassessment - multiple wounds 2 5  - Dermatologic / Skin Assessment (not related to wound area) 0 ASSESSMENTS - Focused Assessment  - Circumferential Edema Measurements - multi extremities 0  - Nutritional Assessment / Counseling / Intervention 0 X - Lower Extremity Assessment (monofilament, tuning fork, pulses) 1 5  - Peripheral Arterial Disease Assessment (using hand held doppler) 0 ASSESSMENTS - Ostomy and/or Continence Assessment and Care  - Incontinence Assessment and Management 0  - Ostomy Care Assessment and Management (repouching, etc.) 0 PROCESS - Coordination of Care X - Simple Patient / Family Education for ongoing care 1 15  - Complex (extensive) Patient / Family Education for ongoing care 0  - Staff obtains Chiropractor, Records, Test Results / Process Orders 0  - Staff telephones HHA, Nursing Homes / Clarify orders / etc 0  - Routine Transfer to another Facility (non-emergent condition) 0 Cuthbertson, Tanija ([id number])  - Routine Hospital Admission (non-emergent condition) 0  - New Admissions / Manufacturing engineer / Ordering NPWT, Apligraf, etc. 0  - Emergency Hospital Admission (emergent condition) 0 X - Simple Discharge Coordination 1 10  - Complex (extensive) Discharge Coordination 0 PROCESS - Special Needs  - Pediatric / Minor Patient Management 0  - Isolation Patient Management 0  - Hearing / Language / Visual special needs 0  - Assessment of Community assistance (transportation, D/C planning, etc.) 0  - Additional assistance / Altered mentation 0  - Support Surface(s) Assessment (bed, cushion, seat, etc.) 0 INTERVENTIONS - Wound Cleansing / Measurement  - Simple Wound Cleansing - one wound 0 X - Complex Wound Cleansing - multiple wounds 2 5 X -  Wound Imaging (photographs - any number of wounds) 1 5  - Wound Tracing (instead of photographs) 0  - Simple Wound Measurement - one wound 0 X -  Complex Wound Measurement - multiple wounds 2 5 INTERVENTIONS - Wound Dressings []  - Small Wound Dressing one or multiple wounds 0 X - Medium Wound Dressing one or multiple wounds 2 15 []  - Large Wound Dressing one or multiple wounds 0 []  - Application of Medications - topical 0 []  - Application of Medications - injection 0 INTERVENTIONS - Miscellaneous []  - External ear exam 0 Whalley, Denya ([id number]) []  - Specimen Collection (cultures, biopsies, blood, body fluids, etc.) 0 []  - Specimen(s) / Culture(s) sent or taken to Lab for analysis 0 []  - Patient Transfer (multiple staff / Michiel SitesHoyer Lift / Similar devices) 0 []  - Simple Staple / Suture removal (25 or less) 0 []  - Complex Staple / Suture removal (26 or more) 0 []  - Hypo / Hyperglycemic Management (close monitor of Blood Glucose) 0 []  - Ankle / Brachial Index (ABI) - do not check if billed separately 0 X - Vital Signs 1 5 Has the patient been seen at the hospital within the last three years: Yes Total Score: 115 Level Of Care: New/Established - Level 3 Electronic Signature(s) Signed: 12/18/2015 5:19:20 PM By: Curtis Sitesorthy, Joanna Entered By: Curtis Sitesorthy, Joanna on 12/18/2015 10:36:55 [name] NationsSILETZKY, [name] ([id number]) -------------------------------------------------------------------------------- Encounter Discharge Information Details Patient Name: [name] NationsSILETZKY, [name] Date of Service: 12/18/2015 10:00 AM Medical Record Number: [id number] Patient Account Number: [id number] Date of Birth/Sex: 06/10/1921 (82 y.o. Female) Treating RN: Curtis Sitesorthy, Joanna Primary Care Physician: Dorothey BasemanBRONSTEIN, DAVID Other Clinician: Referring Physician: Dorothey BasemanBRONSTEIN, DAVID Treating Physician/Extender: Rudene ReBritto, Errol Weeks in Treatment: 1944 Encounter Discharge Information Items Discharge Pain Level: 0 Discharge Condition:  Stable Ambulatory Status: Wheelchair Discharge Destination: Home Transportation: Private Auto Accompanied By: dtr Schedule Follow-up Appointment: Yes Medication Reconciliation completed and provided to Patient/Care No Asiel Chrostowski: Provided on Clinical Summary of Care: 12/18/2015 Form Type Recipient Paper Patient HS Electronic Signature(s) Signed: 12/18/2015 10:55:05 AM By: Gwenlyn PerkingMoore, Shelia Entered By: Gwenlyn PerkingMoore, Shelia on 12/18/2015 10:55:04 [name] NationsSILETZKY, [name] ([id number]) -------------------------------------------------------------------------------- Lower Extremity Assessment Details Patient Name: [name] NationsSILETZKY, [name] Date of Service: 12/18/2015 10:00 AM Medical Record Number: [id number] Patient Account Number: [id number] Date of Birth/Sex: 04/25/1921 (82 y.o. Female) Treating RN: Curtis Sitesorthy, Joanna Primary Care Physician: Dorothey BasemanBRONSTEIN, DAVID Other Clinician: Referring Physician: Dorothey BasemanBRONSTEIN, DAVID Treating Physician/Extender: Rudene ReBritto, Errol Weeks in Treatment: 44 Edema Assessment Assessed: [Left: No] [Right: No] Edema: [Left: Ye] [Right: s] Vascular Assessment Pulses: Posterior Tibial Dorsalis Pedis Palpable: [Right:No] Doppler: [Right:Monophasic] Extremity colors, hair growth, and conditions: Extremity Color: [Right:Pale] Hair Growth on Extremity: [Right:No] Temperature of Extremity: [Right:Cool] Capillary Refill: [Right:< 3 seconds] Electronic Signature(s) Signed: 12/18/2015 5:19:20 PM By: Curtis Sitesorthy, Joanna Entered By: Curtis Sitesorthy, Joanna on 12/18/2015 10:26:28 [name] NationsSILETZKY, [name] ([id number]) -------------------------------------------------------------------------------- Multi Wound Chart Details Patient Name: [name] NationsSILETZKY, [name] Date of Service: 12/18/2015 10:00 AM Medical Record Number: [id number] Patient Account Number: [id number] Date of Birth/Sex: 05/03/1921 (82 y.o. Female) Treating RN: Curtis Sitesorthy, Joanna Primary Care Physician: Dorothey BasemanBRONSTEIN, DAVID Other Clinician: Referring Physician: Terance HartBRONSTEIN,  DAVID Treating Physician/Extender: Rudene ReBritto, Errol Weeks in Treatment: 44 Vital Signs Height(in): 62 Pulse(bpm): 50 Weight(lbs): 155 Blood Pressure 115/44 (mmHg): Body Mass Index(BMI): 28 Temperature(F): 97.3 Respiratory Rate 16 (breaths/min): Photos: [6:No Photos] [9:No Photos] [N/A:N/A] Wound Location: [6:Right Lower Leg - Posterior] [9:Right Lower Leg - Anterior N/A] Wounding Event: [6:Blister] [9:Gradually Appeared] [N/A:N/A] 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] [9:the Lower Extremity] Comorbid History: [6:Arrhythmia, Deep Vein Thrombosis, Peripheral Arterial Disease, Type II Arterial Disease, Type II Diabetes] [9:Arrhythmia, Deep Vein Thrombosis, Peripheral Diabetes] [N/A:N/A] Date Acquired: [6:06/12/2015] [9:08/31/2015] [N/A:N/A] Weeks of Treatment: [6:26] [9:15] [N/A:N/A] Wound Status: [6:Open] [9:Open] [N/A:N/A] Measurements L  x W x D 14x13x0.2 [9:0.3x0.3x0.1] [N/A:N/A] (cm) Area (cm) : [6:142.942] [9:0.071] [N/A:N/A] Volume (cm) : [6:28.588] [9:0.007] [N/A:N/A] % Reduction in Area: [6:-18109.20%] [9:77.40%] [N/A:N/A] % Reduction in Volume: -36087.30% [9:88.90%] [N/A:N/A] Classification: [6:Full Thickness Without Exposed Support Structures] [9:Grade 1] [N/A:N/A] HBO Classification: [6:Grade 1] [9:N/A] [N/A:N/A] Exudate Amount: [6:Large] [9:Small] [N/A:N/A] Exudate Type: [6:Serous] [9:Serous] [N/A:N/A] Exudate Color: [6:amber] [9:amber] [N/A:N/A] Wound Margin: [6:Flat and Intact] [9:Flat and Intact] [N/A:N/A] Granulation Amount: [6:None Present (0%)] [9:Large (67-100%)] [N/A:N/A] Granulation Quality: [6:N/A] [9:Pink] [N/A:N/A] Necrotic Amount: [6:Large (67-100%)] [9:Small (1-33%)] [N/A:N/A] Exposed Structures: Fascia: No Fascia: No N/A Fat: No Fat: No Tendon: No Tendon: No Muscle: No Muscle: No Joint: No Joint: No Bone: No Bone: No Limited to Skin Limited to Skin Breakdown Breakdown Epithelialization: None None  N/A Periwound Skin Texture: Edema: Yes Edema: Yes N/A Excoriation: No Excoriation: No Induration: No Induration: No Callus: No Callus: No Crepitus: No Crepitus: No Fluctuance: No Fluctuance: No Friable: No Friable: No Rash: No Rash: No Scarring: No Scarring: No Periwound Skin Moist: Yes Maceration: Yes N/A Moisture: Maceration: No Moist: Yes Dry/Scaly: No Dry/Scaly: No Periwound Skin Color: Atrophie Blanche: No Erythema: Yes N/A Cyanosis: No Atrophie Blanche: No Ecchymosis: No Cyanosis: No Erythema: No Ecchymosis: No Hemosiderin Staining: No Hemosiderin Staining: No Mottled: No Mottled: No Pallor: No Pallor: No Rubor: No Rubor: No Erythema Location: N/A Circumferential N/A Temperature: No Abnormality N/A N/A Tenderness on Yes Yes N/A Palpation: Wound Preparation: Ulcer Cleansing: Ulcer Cleansing: N/A Rinsed/Irrigated with Rinsed/Irrigated with Saline Saline Topical Anesthetic Topical Anesthetic Applied: Other: lidocaine Applied: Other: lidocaine 4% 4% Treatment Notes Electronic Signature(s) Signed: 12/18/2015 5:19:20 PM By: Curtis Sites Entered By: Curtis Sites on 12/18/2015 10:32:40 [name] ([id number]) -------------------------------------------------------------------------------- Multi-Disciplinary Care Plan Details Patient Name: [name] Date of Service: 12/18/2015 10:00 AM Medical Record Number: [id number] Patient Account Number: [id number] Date of Birth/Sex: 1921-08-26 (82 y.o. Female) Treating RN: Curtis Sites Primary Care Physician: Dorothey Baseman Other Clinician: Referring Physician: Dorothey Baseman Treating Physician/Extender: Rudene Re in Treatment: 28 Active Inactive Abuse / Safety / Falls / Self Care Management Nursing Diagnoses: Impaired physical mobility Potential for falls Goals: Patient will remain injury free Date Initiated: 02/11/2015 Goal Status: Active Patient/caregiver will verbalize  understanding of skin care regimen Date Initiated: 02/11/2015 Goal Status: Active Patient/caregiver will verbalize/demonstrate measures taken to prevent injury and/or falls Date Initiated: 02/11/2015 Goal Status: Active Patient/caregiver will verbalize/demonstrate understanding of what to do in case of emergency Date Initiated: 02/11/2015 Goal Status: Active Interventions: Assess fall risk on admission and as needed Provide education on fall prevention Provide education on safe transfers Treatment Activities: Patient referred to home care : 12/18/2015 Notes: Nutrition Nursing Diagnoses: Imbalanced nutrition Potential for alteratiion in Nutrition/Potential for imbalanced nutrition Safer, Yeimi ([id number]) Goals: Patient/caregiver verbalizes understanding of need to maintain therapeutic glucose control per primary care physician Date Initiated: 02/11/2015 Goal Status: Active Patient/caregiver will maintain therapeutic glucose control Date Initiated: 02/11/2015 Goal Status: Active Interventions: Assess HgA1c results as ordered upon admission and as needed Provide education on elevated blood sugars and impact on wound healing Provide education on nutrition Treatment Activities: Education provided on Nutrition : 04/22/2015 Obtain HgA1c : 12/18/2015 Notes: Wound/Skin Impairment Nursing Diagnoses: Impaired tissue integrity Knowledge deficit related to ulceration/compromised skin integrity Goals: Patient/caregiver will verbalize understanding of skin care regimen Date Initiated: 02/11/2015 Goal Status: Active Ulcer/skin breakdown will heal within 14 weeks Date Initiated: 02/11/2015 Goal Status: Active Interventions: Assess patient/caregiver ability to obtain necessary supplies Assess patient/caregiver ability to perform ulcer/skin care regimen  upon admission and as needed Assess ulceration(s) every visit Provide education on ulcer and skin care Treatment Activities: Skin care  regimen initiated : 12/18/2015 Topical wound management initiated : 12/18/2015 Notes: LUTIE, PICKLER ([id number]) Electronic Signature(s) Signed: 12/18/2015 5:19:20 PM By: Curtis Sites Entered By: Curtis Sites on 12/18/2015 10:32:17 [name] ([id number]) -------------------------------------------------------------------------------- Pain Assessment Details Patient Name: [name] Date of Service: 12/18/2015 10:00 AM Medical Record Number: [id number] Patient Account Number: [id number] Date of Birth/Sex: [birth year] (82 y.o. Female) Treating RN: Curtis Sites Primary Care Physician: Dorothey Baseman Other Clinician: Referring Physician: Dorothey Baseman Treating Physician/Extender: Rudene Re in Treatment: 65 Active Problems Location of Pain Severity and Description of Pain Patient Has Paino Yes Site Locations Pain Location: Pain in Ulcers With Dressing Change: Yes Duration of the Pain. Constant / Intermittento Constant Pain Management and Medication Current Pain Management: Electronic Signature(s) Signed: 12/18/2015 5:19:20 PM By: Curtis Sites Entered By: Curtis Sites on 12/18/2015 10:09:00 [name] ([id number]) -------------------------------------------------------------------------------- Patient/Caregiver Education Details Patient Name: [name] Date of Service: 12/18/2015 10:00 AM Medical Record Number: [id number] Patient Account Number: [id number] Date of Birth/Gender: 01-22-1921 (82 y.o. Female) Treating RN: Curtis Sites Primary Care Physician: Dorothey Baseman Other Clinician: Referring Physician: Dorothey Baseman Treating Physician/Extender: Rudene Re in Treatment: 64 Education Assessment Education Provided To: Patient Education Topics Provided Venous: Handouts: Other: continue elevation Methods: Explain/Verbal Responses: State content correctly Electronic Signature(s) Signed: 12/18/2015 5:19:20 PM By: Curtis Sites Entered By: Curtis Sites on 12/18/2015 10:53:30 [name] ([id number]) -------------------------------------------------------------------------------- Wound Assessment Details Patient Name: [name] Date of Service: 12/18/2015 10:00 AM Medical Record Number: [id number] Patient Account Number: [id number] Date of Birth/Sex: 04-16-1921 (82 y.o. Female) Treating RN: Curtis Sites Primary Care Physician: Dorothey Baseman Other Clinician: Referring Physician: Dorothey Baseman Treating Physician/Extender: Rudene Re in Treatment: 44 Wound Status Wound Number: 6 Primary Arterial Insufficiency Ulcer Etiology: Wound Location: Right Lower Leg - Posterior Wound Open Wounding Event: Blister Status: Date Acquired: 06/12/2015 Comorbid Arrhythmia, Deep Vein Thrombosis, Weeks Of Treatment: 26 History: Peripheral Arterial Disease, Type II Clustered Wound: No Diabetes Photos Photo Uploaded By: Curtis Sites on 12/18/2015 17:11:19 Wound Measurements Length: (cm) 14 Width: (cm) 13 Depth: (cm) 0.2 Area: (cm) 142.942 Volume: (cm) 28.588 % Reduction in Area: -18109.2% % Reduction in Volume: -36087.3% Epithelialization: None Tunneling: No Undermining: No Wound Description Full Thickness Without Foul Odor Aft Classification: Exposed Support Structures Diabetic Severity Grade 1 (Wagner): Wound Margin: Flat and Intact Exudate Amount: Large Exudate Type: Serous Exudate Color: amber er Cleansing: No Wound Bed Granulation Amount: None Present (0%) Exposed Structure Ritsema, Meaghan ([id number]) Necrotic Amount: Large (67-100%) Fascia Exposed: No Necrotic Quality: Adherent Slough Fat Layer Exposed: No Tendon Exposed: No Muscle Exposed: No Joint Exposed: No Bone Exposed: No Limited to Skin Breakdown Periwound Skin Texture Texture Color No Abnormalities Noted: No No Abnormalities Noted: No Callus: No Atrophie Blanche: No Crepitus: No Cyanosis:  No Excoriation: No Ecchymosis: No Fluctuance: No Erythema: No Friable: No Hemosiderin Staining: No Induration: No Mottled: No Localized Edema: Yes Pallor: No Rash: No Rubor: No Scarring: No Temperature / Pain Moisture Temperature: No Abnormality No Abnormalities Noted: No Tenderness on Palpation: Yes Dry / Scaly: No Maceration: No Moist: Yes Wound Preparation Ulcer Cleansing: Rinsed/Irrigated with Saline Topical Anesthetic Applied: Other: lidocaine 4%, Treatment Notes Wound #6 (Right, Posterior Lower Leg) 1. Cleansed with: Clean wound with Normal Saline 2. Anesthetic Topical Lidocaine 4% cream to wound bed prior to debridement 4. Dressing Applied: Aquacel Ag Other dressing (specify in notes) 5. Secondary  Dressing Applied ABD and Kerlix/Conform Notes xtrasorb Electronic Signature(s) Signed: 12/18/2015 5:19:20 PM By: Mechele Dawley, [name] ([id number]) Entered By: Curtis Sites on 12/18/2015 10:22:55 [name] ([id number]) -------------------------------------------------------------------------------- Wound Assessment Details Patient Name: [name] Date of Service: 12/18/2015 10:00 AM Medical Record Number: [id number] Patient Account Number: [id number] Date of Birth/Sex: 20-Jun-1921 (82 y.o. Female) Treating RN: Curtis Sites Primary Care Physician: Dorothey Baseman Other Clinician: Referring Physician: Terance Hart, DAVID Treating Physician/Extender: Rudene Re in Treatment: 44 Wound Status Wound Number: 9 Primary Diabetic Wound/Ulcer of the Lower Etiology: Extremity Wound Location: Right Lower Leg - Anterior Wound Open Wounding Event: Gradually Appeared Status: Date Acquired: 08/31/2015 Comorbid Arrhythmia, Deep Vein Thrombosis, Weeks Of Treatment: 15 History: Peripheral Arterial Disease, Type II Clustered Wound: No Diabetes Photos Photo Uploaded By: Curtis Sites on 12/18/2015 17:11:32 Wound Measurements Length: (cm)  0.3 Width: (cm) 0.3 Depth: (cm) 0.1 Area: (cm) 0.071 Volume: (cm) 0.007 % Reduction in Area: 77.4% % Reduction in Volume: 88.9% Epithelialization: None Tunneling: No Undermining: No Wound Description Classification: Grade 1 Wound Margin: Flat and Intact Exudate Amount: Small Exudate Type: Serous Exudate Color: amber Foul Odor After Cleansing: No Wound Bed Granulation Amount: Large (67-100%) Exposed Structure Granulation Quality: Pink Fascia Exposed: No Necrotic Amount: Small (1-33%) Fat Layer Exposed: No Necrotic Quality: Adherent Slough Tendon Exposed: No Hitson, Rocquel ([id number]) Muscle Exposed: No Joint Exposed: No Bone Exposed: No Limited to Skin Breakdown Periwound Skin Texture Texture Color No Abnormalities Noted: No No Abnormalities Noted: No Callus: No Atrophie Blanche: No Crepitus: No Cyanosis: No Excoriation: No Ecchymosis: No Fluctuance: No Erythema: Yes Friable: No Erythema Location: Circumferential Induration: No Hemosiderin Staining: No Localized Edema: Yes Mottled: No Rash: No Pallor: No Scarring: No Rubor: No Moisture Temperature / Pain No Abnormalities Noted: No Tenderness on Palpation: Yes Dry / Scaly: No Maceration: Yes Moist: Yes Wound Preparation Ulcer Cleansing: Rinsed/Irrigated with Saline Topical Anesthetic Applied: Other: lidocaine 4%, Treatment Notes Wound #9 (Right, Anterior Lower Leg) 1. Cleansed with: Clean wound with Normal Saline 2. Anesthetic Topical Lidocaine 4% cream to wound bed prior to debridement 4. Dressing Applied: Aquacel Ag Other dressing (specify in notes) 5. Secondary Dressing Applied ABD and Kerlix/Conform Notes xtrasorb Electronic Signature(s) Signed: 12/18/2015 5:19:20 PM By: Curtis Sites Entered By: Curtis Sites on 12/18/2015 10:26:08 [name] ([id number]) -------------------------------------------------------------------------------- Vitals Details Patient Name: [name] Date of Service: 12/18/2015 10:00 AM Medical Record Number: [id number] Patient Account Number: [id number] Date of Birth/Sex: [birth year] (82 y.o. Female) Treating RN: Curtis Sites Primary Care Physician: Dorothey Baseman Other Clinician: Referring Physician: Terance Hart, DAVID Treating Physician/Extender: Rudene Re in Treatment: 14 Vital Signs Time Taken: 10:10 Temperature (F): 97.3 Height (in): 62 Pulse (bpm): 50 Weight (lbs): 155 Respiratory Rate (breaths/min): 16 Body Mass Index (BMI): 28.3 Blood Pressure (mmHg): 115/44 Reference Range: 80 - 120 mg / dl Electronic Signature(s) Signed: 12/18/2015 5:19:20 PM By: Curtis Sites Entered By: Curtis Sites on 12/18/2015 10:12:21

## 2016-01-01 ENCOUNTER — Encounter: Payer: Medicare Other | Attending: Surgery | Admitting: Surgery

## 2016-01-01 DIAGNOSIS — L97812 Non-pressure chronic ulcer of other part of right lower leg with fat layer exposed: Secondary | ICD-10-CM | POA: Insufficient documentation

## 2016-01-01 DIAGNOSIS — T8131XA Disruption of external operation (surgical) wound, not elsewhere classified, initial encounter: Secondary | ICD-10-CM | POA: Diagnosis not present

## 2016-01-01 DIAGNOSIS — I70235 Atherosclerosis of native arteries of right leg with ulceration of other part of foot: Secondary | ICD-10-CM | POA: Insufficient documentation

## 2016-01-01 DIAGNOSIS — L97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: Secondary | ICD-10-CM | POA: Insufficient documentation

## 2016-01-01 DIAGNOSIS — Y839 Surgical procedure, unspecified as the cause of abnormal reaction of the patient, or of later complication, without mention of misadventure at the time of the procedure: Secondary | ICD-10-CM | POA: Diagnosis not present

## 2016-01-01 DIAGNOSIS — E11621 Type 2 diabetes mellitus with foot ulcer: Secondary | ICD-10-CM | POA: Insufficient documentation

## 2016-01-01 DIAGNOSIS — Z89512 Acquired absence of left leg below knee: Secondary | ICD-10-CM | POA: Insufficient documentation

## 2016-01-02 NOTE — Progress Notes (Signed)
SUHANA, WILNER ([id number]) Visit Report for 01/01/2016 Arrival Information Details Patient Name: [name], [name] Date of Service: 01/01/2016 10:45 AM Medical Record Number: [id number] Patient Account Number: [id number] Date of Birth/Sex: September 28, 1920 (82 y.o. Female) Treating RN: Curtis Sites Primary Care Physician: Dorothey Baseman Other Clinician: Referring Physician: Dorothey Baseman Treating Physician/Extender: Rudene Re in Treatment: 48 Visit Information History Since Last Visit Added or deleted any medications: No Patient Arrived: Wheel Chair Any new allergies or adverse reactions: No Arrival Time: 10:43 Had a fall or experienced change in No activities of daily living that may affect Accompanied By: dtr in law risk of falls: Transfer Assistance: Manual Signs or symptoms of abuse/neglect since last No Patient Identification Verified: Yes visito Secondary Verification Process Yes Hospitalized since last visit: No Completed: Pain Present Now: Yes Patient Has Alerts: Yes Electronic Signature(s) Signed: 01/01/2016 5:50:07 PM By: Curtis Sites Entered By: Curtis Sites on 01/01/2016 10:45:35 [name] ([id number]) -------------------------------------------------------------------------------- Clinic Level of Care Assessment Details Patient Name: [name] Date of Service: 01/01/2016 10:45 AM Medical Record Number: [id number] Patient Account Number: [id number] Date of Birth/Sex: Aug 12, 1921 (82 y.o. Female) Treating RN: Curtis Sites Primary Care Physician: Terance Hart, DAVID Other Clinician: Referring Physician: Terance Hart, DAVID Treating Physician/Extender: Rudene Re in Treatment: 65 Clinic Level of Care Assessment Items TOOL 4 Quantity Score  - Use when only an EandM is performed on FOLLOW-UP visit 0 ASSESSMENTS - Nursing Assessment / Reassessment X - Reassessment of Co-morbidities (includes updates in patient status) 1 10 X - Reassessment of  Adherence to Treatment Plan 1 5 ASSESSMENTS - Wound and Skin Assessment / Reassessment  - Simple Wound Assessment / Reassessment - one wound 0 X - Complex Wound Assessment / Reassessment - multiple wounds 2 5  - Dermatologic / Skin Assessment (not related to wound area) 0 ASSESSMENTS - Focused Assessment  - Circumferential Edema Measurements - multi extremities 0  - Nutritional Assessment / Counseling / Intervention 0 X - Lower Extremity Assessment (monofilament, tuning fork, pulses) 1 5  - Peripheral Arterial Disease Assessment (using hand held doppler) 0 ASSESSMENTS - Ostomy and/or Continence Assessment and Care  - Incontinence Assessment and Management 0  - Ostomy Care Assessment and Management (repouching, etc.) 0 PROCESS - Coordination of Care X - Simple Patient / Family Education for ongoing care 1 15  - Complex (extensive) Patient / Family Education for ongoing care 0  - Staff obtains Chiropractor, Records, Test Results / Process Orders 0  - Staff telephones HHA, Nursing Homes / Clarify orders / etc 0  - Routine Transfer to another Facility (non-emergent condition) 0 Bronder, Chinelo ([id number])  - Routine Hospital Admission (non-emergent condition) 0  - New Admissions / Manufacturing engineer / Ordering NPWT, Apligraf, etc. 0  - Emergency Hospital Admission (emergent condition) 0 X - Simple Discharge Coordination 1 10  - Complex (extensive) Discharge Coordination 0 PROCESS - Special Needs  - Pediatric / Minor Patient Management 0  - Isolation Patient Management 0  - Hearing / Language / Visual special needs 0  - Assessment of Community assistance (transportation, D/C planning, etc.) 0  - Additional assistance / Altered mentation 0  - Support Surface(s) Assessment (bed, cushion, seat, etc.) 0 INTERVENTIONS - Wound Cleansing / Measurement  - Simple Wound Cleansing - one wound 0 X - Complex Wound Cleansing - multiple wounds 2 5 X -  Wound Imaging (photographs - any number of wounds) 1 5  - Wound Tracing (instead of photographs) 0  - Simple Wound Measurement - one wound  0 X - Complex Wound Measurement - multiple wounds 2 5 INTERVENTIONS - Wound Dressings []  - Small Wound Dressing one or multiple wounds 0 X - Medium Wound Dressing one or multiple wounds 1 15 []  - Large Wound Dressing one or multiple wounds 0 X - Application of Medications - topical 1 5 []  - Application of Medications - injection 0 INTERVENTIONS - Miscellaneous []  - External ear exam 0 Nissen, Viridiana ([id number]) []  - Specimen Collection (cultures, biopsies, blood, body fluids, etc.) 0 []  - Specimen(s) / Culture(s) sent or taken to Lab for analysis 0 []  - Patient Transfer (multiple staff / Michiel SitesHoyer Lift / Similar devices) 0 []  - Simple Staple / Suture removal (25 or less) 0 []  - Complex Staple / Suture removal (26 or more) 0 []  - Hypo / Hyperglycemic Management (close monitor of Blood Glucose) 0 []  - Ankle / Brachial Index (ABI) - do not check if billed separately 0 X - Vital Signs 1 5 Has the patient been seen at the hospital within the last three years: Yes Total Score: 105 Level Of Care: New/Established - Level 3 Electronic Signature(s) Signed: 01/01/2016 12:05:57 PM By: Curtis Sitesorthy, Joanna Entered By: Curtis Sitesorthy, Joanna on 01/01/2016 12:05:56 [name] NationsSILETZKY, [name] ([id number]) -------------------------------------------------------------------------------- Encounter Discharge Information Details Patient Name: [name] NationsSILETZKY, [name] Date of Service: 01/01/2016 10:45 AM Medical Record Number: [id number] Patient Account Number: [id number] Date of Birth/Sex: 10/22/1920 (82 y.o. Female) Treating RN: Curtis Sitesorthy, Joanna Primary Care Physician: Dorothey BasemanBRONSTEIN, DAVID Other Clinician: Referring Physician: Dorothey BasemanBRONSTEIN, DAVID Treating Physician/Extender: Rudene ReBritto, Errol Weeks in Treatment: 3846 Encounter Discharge Information Items Discharge Pain Level: 0 Discharge Condition:  Stable Ambulatory Status: Wheelchair Discharge Destination: Home Transportation: Private Auto Accompanied By: dtr in law Schedule Follow-up Appointment: Yes Medication Reconciliation completed No and provided to Patient/Care Carinne Brandenburger: Provided on Clinical Summary of Care: 01/01/2016 Form Type Recipient Paper Patient HS Electronic Signature(s) Signed: 01/01/2016 12:07:12 PM By: Curtis Sitesorthy, Joanna Previous Signature: 01/01/2016 11:40:21 AM Version By: Gwenlyn PerkingMoore, Shelia Entered By: Curtis Sitesorthy, Joanna on 01/01/2016 12:07:12 [name] NationsSILETZKY, [name] ([id number]) -------------------------------------------------------------------------------- Lower Extremity Assessment Details Patient Name: [name] NationsSILETZKY, [name] Date of Service: 01/01/2016 10:45 AM Medical Record Number: [id number] Patient Account Number: [id number] Date of Birth/Sex: 02/18/1921 (82 y.o. Female) Treating RN: Curtis Sitesorthy, Joanna Primary Care Physician: Dorothey BasemanBRONSTEIN, DAVID Other Clinician: Referring Physician: Dorothey BasemanBRONSTEIN, DAVID Treating Physician/Extender: Rudene ReBritto, Errol Weeks in Treatment: 46 Edema Assessment Assessed: [Left: No] [Right: No] Edema: [Left: Ye] [Right: s] Vascular Assessment Pulses: Posterior Tibial Dorsalis Pedis Palpable: [Right:No] Doppler: [Right:Monophasic] Extremity colors, hair growth, and conditions: Extremity Color: [Right:Red] Hair Growth on Extremity: [Right:No] Temperature of Extremity: [Right:Cool] Capillary Refill: [Right:< 3 seconds] Electronic Signature(s) Signed: 01/01/2016 5:50:07 PM By: Curtis Sitesorthy, Joanna Entered By: Curtis Sitesorthy, Joanna on 01/01/2016 10:58:43 [name] NationsSILETZKY, Neyra ([id number]) -------------------------------------------------------------------------------- Multi Wound Chart Details Patient Name: [name] NationsSILETZKY, Jayce Date of Service: 01/01/2016 10:45 AM Medical Record Number: [id number] Patient Account Number: [id number] Date of Birth/Sex: 01/28/1921 (82 y.o. Female) Treating RN: Curtis Sitesorthy, Joanna Primary Care Physician:  Dorothey BasemanBRONSTEIN, DAVID Other Clinician: Referring Physician: Terance HartBRONSTEIN, DAVID Treating Physician/Extender: Rudene ReBritto, Errol Weeks in Treatment: 46 Vital Signs Height(in): 62 Pulse(bpm): 62 Weight(lbs): 155 Blood Pressure 101/53 (mmHg): Body Mass Index(BMI): 28 Temperature(F): 97.7 Respiratory Rate 16 (breaths/min): Photos: [6:No Photos] [9:No Photos] [N/A:N/A] Wound Location: [6:Right Lower Leg - Posterior] [9:Right, Anterior Lower Leg] [N/A:N/A] Wounding Event: [6:Blister] [9:Gradually Appeared] [N/A:N/A] 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] [N/A:N/A] Comorbid History: [6:Arrhythmia, Deep Vein Thrombosis, Peripheral Arterial Disease, Type II Diabetes] [9:N/A] [N/A:N/A] Date Acquired: [6:06/12/2015] [9:08/31/2015] [N/A:N/A] Weeks of Treatment: [6:28] [9:17] [N/A:N/A] Wound Status: [  6:Open] [9:Healed - Epithelialized] [N/A:N/A] Measurements L x W x D 11x15x0.2 [9:0x0x0] [N/A:N/A] (cm) Area (cm) : [6:129.591] [9:0] [N/A:N/A] Volume (cm) : [6:25.918] [9:0] [N/A:N/A] % Reduction in Area: [6:-16408.40%] [9:100.00%] [N/A:N/A] % Reduction in Volume: -32707.60% [9:100.00%] [N/A:N/A] Classification: [6:Full Thickness Without Exposed Support Structures] [9:Grade 1] [N/A:N/A] HBO Classification: [6:Grade 1] [9:N/A] [N/A:N/A] Exudate Amount: [6:Large] [9:N/A] [N/A:N/A] Exudate Type: [6:Serous] [9:N/A] [N/A:N/A] Exudate Color: [6:amber] [9:N/A] [N/A:N/A] Wound Margin: [6:Flat and Intact] [9:N/A] [N/A:N/A] Granulation Amount: [6:None Present (0%)] [9:N/A] [N/A:N/A] Necrotic Amount: [6:Large (67-100%)] [9:N/A] [N/A:N/A] Exposed Structures: [9:N/A] [N/A:N/A] Fascia: No Fat: No Tendon: No Muscle: No Joint: No Bone: No Limited to Skin Breakdown Epithelialization: None N/A N/A Periwound Skin Texture: Edema: Yes No Abnormalities Noted N/A Excoriation: No Induration: No Callus: No Crepitus: No Fluctuance: No Friable: No Rash:  No Scarring: No Periwound Skin Moist: Yes No Abnormalities Noted N/A Moisture: Maceration: No Dry/Scaly: No Periwound Skin Color: Atrophie Blanche: No No Abnormalities Noted N/A Cyanosis: No Ecchymosis: No Erythema: No Hemosiderin Staining: No Mottled: No Pallor: No Rubor: No Temperature: No Abnormality N/A N/A Tenderness on Yes No N/A Palpation: Wound Preparation: Ulcer Cleansing: N/A N/A Rinsed/Irrigated with Saline Topical Anesthetic Applied: Other: lidocaine 4% Treatment Notes Electronic Signature(s) Signed: 01/01/2016 5:50:07 PM By: Curtis Sites Entered By: Curtis Sites on 01/01/2016 10:59:39 [name] ([id number]) -------------------------------------------------------------------------------- Multi-Disciplinary Care Plan Details Patient Name: [name] Date of Service: 01/01/2016 10:45 AM Medical Record Number: [id number] Patient Account Number: [id number] Date of Birth/Sex: 19-Sep-1921 (82 y.o. Female) Treating RN: Curtis Sites Primary Care Physician: Dorothey Baseman Other Clinician: Referring Physician: Dorothey Baseman Treating Physician/Extender: Rudene Re in Treatment: 37 Active Inactive Abuse / Safety / Falls / Self Care Management Nursing Diagnoses: Impaired physical mobility Potential for falls Goals: Patient will remain injury free Date Initiated: 02/11/2015 Goal Status: Active Patient/caregiver will verbalize understanding of skin care regimen Date Initiated: 02/11/2015 Goal Status: Active Patient/caregiver will verbalize/demonstrate measures taken to prevent injury and/or falls Date Initiated: 02/11/2015 Goal Status: Active Patient/caregiver will verbalize/demonstrate understanding of what to do in case of emergency Date Initiated: 02/11/2015 Goal Status: Active Interventions: Assess fall risk on admission and as needed Provide education on fall prevention Provide education on safe transfers Treatment  Activities: Patient referred to home care : 01/01/2016 Notes: Nutrition Nursing Diagnoses: Imbalanced nutrition Potential for alteratiion in Nutrition/Potential for imbalanced nutrition Condron, Tylena ([id number]) Goals: Patient/caregiver verbalizes understanding of need to maintain therapeutic glucose control per primary care physician Date Initiated: 02/11/2015 Goal Status: Active Patient/caregiver will maintain therapeutic glucose control Date Initiated: 02/11/2015 Goal Status: Active Interventions: Assess HgA1c results as ordered upon admission and as needed Provide education on elevated blood sugars and impact on wound healing Provide education on nutrition Treatment Activities: Education provided on Nutrition : 04/22/2015 Obtain HgA1c : 01/01/2016 Notes: Wound/Skin Impairment Nursing Diagnoses: Impaired tissue integrity Knowledge deficit related to ulceration/compromised skin integrity Goals: Patient/caregiver will verbalize understanding of skin care regimen Date Initiated: 02/11/2015 Goal Status: Active Ulcer/skin breakdown will heal within 14 weeks Date Initiated: 02/11/2015 Goal Status: Active Interventions: Assess patient/caregiver ability to obtain necessary supplies Assess patient/caregiver ability to perform ulcer/skin care regimen upon admission and as needed Assess ulceration(s) every visit Provide education on ulcer and skin care Treatment Activities: Skin care regimen initiated : 01/01/2016 Topical wound management initiated : 01/01/2016 Notes: MAIRIM, BADE ([id number]) Electronic Signature(s) Signed: 01/01/2016 5:50:07 PM By: Curtis Sites Entered By: Curtis Sites on 01/01/2016 10:58:58 [name] ([id number]) -------------------------------------------------------------------------------- Pain Assessment Details Patient Name: [name] Date of Service: 01/01/2016  10:45 AM Medical Record Number: [id number] Patient Account Number: [id number] Date  of Birth/Sex: 09/17/1921 (82 y.o. Female) Treating RN: Curtis Sites Primary Care Physician: Dorothey Baseman Other Clinician: Referring Physician: Dorothey Baseman Treating Physician/Extender: Rudene Re in Treatment: 46 Active Problems Location of Pain Severity and Description of Pain Patient Has Paino Yes Site Locations Pain Location: Pain in Ulcers With Dressing Change: Yes Duration of the Pain. Constant / Intermittento Constant Pain Management and Medication Current Pain Management: Electronic Signature(s) Signed: 01/01/2016 5:50:07 PM By: Curtis Sites Entered By: Curtis Sites on 01/01/2016 10:45:53 [name] ([id number]) -------------------------------------------------------------------------------- Patient/Caregiver Education Details Patient Name: [name] Date of Service: 01/01/2016 10:45 AM Medical Record Number: [id number] Patient Account Number: [id number] Date of Birth/Gender: 03/09/1921 (82 y.o. Female) Treating RN: Curtis Sites Primary Care Physician: Dorothey Baseman Other Clinician: Referring Physician: Dorothey Baseman Treating Physician/Extender: Rudene Re in Treatment: 9 Education Assessment Education Provided To: Patient and Caregiver Education Topics Provided Wound/Skin Impairment: Handouts: Other: call for earlier apt if needed r/t change in wound care Methods: Explain/Verbal Responses: State content correctly Electronic Signature(s) Signed: 01/01/2016 12:07:36 PM By: Curtis Sites Entered By: Curtis Sites on 01/01/2016 12:07:36 [name] ([id number]) -------------------------------------------------------------------------------- Wound Assessment Details Patient Name: [name] Date of Service: 01/01/2016 10:45 AM Medical Record Number: [id number] Patient Account Number: [id number] Date of Birth/Sex: 03/20/1921 (82 y.o. Female) Treating RN: Curtis Sites Primary Care Physician: Dorothey Baseman Other  Clinician: Referring Physician: Dorothey Baseman Treating Physician/Extender: Rudene Re in Treatment: 46 Wound Status Wound Number: 6 Primary Arterial Insufficiency Ulcer Etiology: Wound Location: Right Lower Leg - Posterior Wound Open Wounding Event: Blister Status: Date Acquired: 06/12/2015 Comorbid Arrhythmia, Deep Vein Thrombosis, Weeks Of Treatment: 28 History: Peripheral Arterial Disease, Type II Clustered Wound: No Diabetes Photos Photo Uploaded By: Curtis Sites on 01/01/2016 12:09:09 Wound Measurements Length: (cm) 11 Width: (cm) 15 Depth: (cm) 0.2 Area: (cm) 129.591 Volume: (cm) 25.918 % Reduction in Area: -16408.4% % Reduction in Volume: -32707.6% Epithelialization: None Tunneling: No Undermining: No Wound Description Full Thickness Without Foul Odor Aft Classification: Exposed Support Structures Diabetic Severity Grade 1 (Wagner): Wound Margin: Flat and Intact Exudate Amount: Large Exudate Type: Serous Exudate Color: amber er Cleansing: No Wound Bed Granulation Amount: None Present (0%) Exposed Structure Bou, Ellenor ([id number]) Necrotic Amount: Large (67-100%) Fascia Exposed: No Necrotic Quality: Adherent Slough Fat Layer Exposed: No Tendon Exposed: No Muscle Exposed: No Joint Exposed: No Bone Exposed: No Limited to Skin Breakdown Periwound Skin Texture Texture Color No Abnormalities Noted: No No Abnormalities Noted: No Callus: No Atrophie Blanche: No Crepitus: No Cyanosis: No Excoriation: No Ecchymosis: No Fluctuance: No Erythema: No Friable: No Hemosiderin Staining: No Induration: No Mottled: No Localized Edema: Yes Pallor: No Rash: No Rubor: No Scarring: No Temperature / Pain Moisture Temperature: No Abnormality No Abnormalities Noted: No Tenderness on Palpation: Yes Dry / Scaly: No Maceration: No Moist: Yes Wound Preparation Ulcer Cleansing: Rinsed/Irrigated with Saline Topical Anesthetic  Applied: Other: lidocaine 4%, Treatment Notes Wound #6 (Right, Posterior Lower Leg) 1. Cleansed with: Clean wound with Normal Saline 2. Anesthetic Topical Lidocaine 4% cream to wound bed prior to debridement 4. Dressing Applied: Medihoney Alginate Other dressing (specify in notes) 5. Secondary Dressing Applied ABD and Kerlix/Conform Notes xtrasorb Electronic Signature(s) Signed: 01/01/2016 5:50:07 PM By: Mechele Dawley, Versia ([id number]) Entered By: Curtis Sites on 01/01/2016 10:59:29 [name] ([id number]) -------------------------------------------------------------------------------- Wound Assessment Details Patient Name: [name] Date of Service: 01/01/2016 10:45 AM Medical Record Number: [id number] Patient Account Number: [id number] Date of  Birth/Sex: 01-Oct-1920 (82 y.o. Female) Treating RN: Curtis Sites Primary Care Physician: Dorothey Baseman Other Clinician: Referring Physician: Terance Hart, DAVID Treating Physician/Extender: Rudene Re in Treatment: 46 Wound Status Wound Number: 9 Primary Diabetic Wound/Ulcer of the Lower Etiology: Extremity Wound Location: Right, Anterior Lower Leg Wound Status: Healed - Epithelialized Wounding Event: Gradually Appeared Date Acquired: 08/31/2015 Weeks Of Treatment: 17 Clustered Wound: No Photos Photo Uploaded By: Curtis Sites on 01/01/2016 12:09:22 Wound Measurements Length: (cm) 0 % Reduction Width: (cm) 0 % Reduction Depth: (cm) 0 Area: (cm) 0 Volume: (cm) 0 in Area: 100% in Volume: 100% Wound Description Classification: Grade 1 Periwound Skin Texture Texture Color No Abnormalities Noted: No No Abnormalities Noted: No Moisture No Abnormalities Noted: No Electronic Signature(s) Signed: 01/01/2016 5:50:07 PM By: Mechele Dawley, Shevonne ([id number]) Entered By: Curtis Sites on 01/01/2016 10:58:19 [name]  ([id number]) -------------------------------------------------------------------------------- Vitals Details Patient Name: [name] Date of Service: 01/01/2016 10:45 AM Medical Record Number: [id number] Patient Account Number: [id number] Date of Birth/Sex: 10-19-1920 (82 y.o. Female) Treating RN: Curtis Sites Primary Care Physician: Dorothey Baseman Other Clinician: Referring Physician: Terance Hart, DAVID Treating Physician/Extender: Rudene Re in Treatment: 83 Vital Signs Time Taken: 10:47 Temperature (F): 97.7 Height (in): 62 Pulse (bpm): 62 Weight (lbs): 155 Respiratory Rate (breaths/min): 16 Body Mass Index (BMI): 28.3 Blood Pressure (mmHg): 101/53 Reference Range: 80 - 120 mg / dl Electronic Signature(s) Signed: 01/01/2016 5:50:07 PM By: Curtis Sites Entered By: Curtis Sites on 01/01/2016 10:49:03

## 2016-01-02 NOTE — Progress Notes (Signed)
TENIOLA, TSENG ([id number]) Visit Report for 01/01/2016 Chief Complaint Document Details Patient Name: [name], [name] Date of Service: 01/01/2016 10:45 AM Medical Record Number: [id number] Patient Account Number: [id number] Date of Birth/Sex: 05/23/1921 (82 y.o. Female) Treating RN: Curtis Sites Primary Care Physician: Dorothey Baseman Other Clinician: Referring Physician: Dorothey Baseman Treating Physician/Extender: Rudene Re in Treatment: 106 Information Obtained from: Patient Chief Complaint Patient presents to the wound care center for a consult due non healing wound. 82 year old patient with comes with a history of a ulcerated area to the right third toe and the right heel which she's had for about 2 months. Electronic Signature(s) Signed: 01/01/2016 11:48:36 AM By: Evlyn Kanner MD, FACS Entered By: Evlyn Kanner on 01/01/2016 11:48:36 [name] ([id number]) -------------------------------------------------------------------------------- HPI Details Patient Name: [name] Date of Service: 01/01/2016 10:45 AM Medical Record Number: [id number] Patient Account Number: [id number] Date of Birth/Sex: 1920/11/14 (82 y.o. Female) Treating RN: Curtis Sites Primary Care Physician: Dorothey Baseman Other Clinician: Referring Physician: Dorothey Baseman Treating Physician/Extender: Rudene Re in Treatment: 24 History of Present Illness Location: right medial heel and right third toe Quality: Patient reports experiencing a dull pain to affected area(s). Severity: Patient states wound are getting worse. Duration: Patient has had the wound for > 3 months prior to seeking treatment at the wound center Timing: Pain in wound is Intermittent (comes and goes Context: The wound appeared gradually over time Modifying Factors: Consults to this date include:vascular surgeon and several recent surgeries. Associated Signs and Symptoms: Patient reports having foul odor and  drainage from the left below-knee amputation site. HPI Description: A pleasant 82 year old patient who is known to have diabetes mellitus for several years has recently gone through a series of operations with the vascular surgeon Dr. Gilda Crease. In January and February she had several surgeries on the left lower extremity with an attempt to have limb salvage for gangrenous changes of her forefoot. 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 On 01/07/2015 she also had a right lower extremity distal runoff with a angioplasty of the right anterior tibial artery to maximize her blood flow to the foot. She recently had her staples removed at Dr. Marijean Heath office on 01/31/2015 and at that time a right lower extremity duplex was done which showed patent vessels and a patent stent with distal occluded posterior tibial artery. Though the duplex was noncritical the recommendations from the PA at the vascular surgery office was that of a angiogram to be done but the patient said she would rather try some bone care before. The patient has received doxycycline and Cipro in the recent past and she takes oral medications for her diabetes. Other than that I reviewed her list of all her medications. No recent hemoglobin A1c has been done and no recent x-rays of the right foot have been done. 02/18/2015 -- x-ray of the right foot was done on 02/11/2015 and it shows no evidence of acute osteomyelitis of the third toe or of the calcaneus. She had gone to the vascular surgery office and they had noted that there is dehiscence of the left part of the amputation site of the below-knee wound and they have asked [place] to kindly take over the care of this. There've been doing dressings for the right heel and right third toe. 02/25/2015 -- we have received notes from the vascular group who saw her last on 02/13/2015 and she was seen by the PA Ms.  Cleda DaubKimberly Stegmayer. She had recommended that the patient continue to follow with us for wound care including management of the left BKA stump, which has had dehiscence of the lateral part. They will consider repeating a arterial duplex or angiogram if the right lower extremity does not heal within a reasonable period of time. 03/18/2015 -- he saw the vascular surgeon Dr. Gilda CreaseSchnier and he has set her up for an angioplasty sometime in the middle of July. she is doing well otherwise. [name] NationsSILETZKY, [name] ([id number]) 04/10/2015 -- the patient had a procedure done on 04/08/2015 and this was a right angioplasty of the dorsalis pedis, anterior tibial artery, first superficial femoral artery in its midportion. There was successful intervention with recanalization and in-line flow to the right foot. 04/22/2015 -- the patient was looking rather pale today and the daughter confirms that her hemoglobin was down to 6.9. she is being monitored by her PCP. Her Lasix dose was increased and her edema has gone down significantly. 04/29/2015 -- she had vascular studies done and the ABI on the right is 1.3 and the toe pressures were within normal limits. Her hemoglobin is still around 7 and she refuses to take any blood transfusions for religious reasons. Her potassium was normal. 06/10/2015 -- she has a new blister on her right lateral and posterior part of her leg just in the region where the Kerlix bandages were applied and this may be due to an abrasion. 06/24/2015 -- On her right lower extremity she has developed several more blisters which look like small pustules and the drain informed shallow ulcerations. 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 07/01/2015 -- she has an appointment with the PCP this Friday and her vascular surgeon on Monday. Other than that she is on doxycycline and has finished 1 week of treatment. The pustules she had all over have now resolved. 07/08/2015 -- she saw the vascular surgeon who did  studies in the office and found that there is poor circulation in the distal lower right leg and has set her up for an angiogram and possible stenting next Tuesday. 07/22/2015 -- on October 18 she was taken up for a successful revascularization of the anterior tibial vessel on the right side. a PTA and stent placement was done to the right anterior tibial artery. 08/19/2015 -- she has broken out with some linear ulceration in the region of her webspaces of her toes and this is something new. 08/26/2015 -- over the last 3 days there was a streak of redness going from her lower extremity towards her toes but she had no fever or discharge from the wounds. 09/02/2015 -- the redness and cellulitis has gone down but she continues to have a lot of edema of the right lower extremity. 10/28/2015 -- over the last week she has been draining a lot of fluid from her right lower extremities and several of the wounds which had healed in the past and now opened up again 11/06/2015 -- she was seen in the vascular office and the right ABI was more than 1.3 which was consistent with noncompressible arteries due to medial calcification and the right great toe pressure was 0.24 and the PPG waveform showed significant decrease. from talking to the family member I believe no procedure has been planned. She was also seen by her PCP was increased her Lasix to 80 mg a day and has ordered some lab work but these reports are not back. 11/20/2015 -- Recent labs done hemoglobin A1c was 6.2, INR was .93,  iron-binding capacity was 310, BMP was within normal limits except for the BUN which was 40 and albumin was 3.1. Her hemoglobin is up to 10.7 with a hematocrit of 34.2 he was advised lymphedema pumps by Dr. Gilda CreaseSchnier and she has not been wearing these. Her daughter-in- law also says that she has been very noncompliant about elevation of her limbs. 12/18/2015 -- she was seen by Dr. Gilda CreaseSchnier who agreed with the management and  did not think she required any intervention. He discussed elevation and use of lymph pump and will see her back as required. [name] NationsSILETZKY, [name] ([id number]) Electronic Signature(s) Signed: 01/01/2016 11:48:44 AM By: Evlyn KannerBritto, Loletta Harper MD, FACS Entered By: Evlyn KannerBritto, Aleigha Gilani on 01/01/2016 11:48:43 [name] NationsSILETZKY, [name] ([id number]) -------------------------------------------------------------------------------- Physical Exam Details Patient Name: [name] NationsSILETZKY, [name] Date of Service: 01/01/2016 10:45 AM Medical Record Number: [id number] Patient Account Number: [id number] Date of Birth/Sex: 01/28/1921 (82 y.o. Female) Treating RN: Curtis Sitesorthy, Joanna Primary Care Physician: Dorothey BasemanBRONSTEIN, DAVID Other Clinician: Referring Physician: Dorothey BasemanBRONSTEIN, DAVID Treating Physician/Extender: Rudene ReBritto, Alvy Alsop Weeks in Treatment: 46 Constitutional . Pulse regular. Respirations normal and unlabored. Afebrile. . Eyes Nonicteric. Reactive to light. Ears, Nose, Mouth, and Throat Lips, teeth, and gums WNL.Marland Kitchen. Moist mucosa without lesions. Neck supple and nontender. No palpable supraclavicular or cervical adenopathy. Normal sized without goiter. Respiratory WNL. No retractions.. Cardiovascular Pedal Pulses WNL. No clubbing, cyanosis or edema. Lymphatic No adneopathy. No adenopathy. No adenopathy. Musculoskeletal Adexa without tenderness or enlargement.. Digits and nails w/o clubbing, cyanosis, infection, petechiae, ischemia, or inflammatory conditions.. Integumentary (Hair, Skin) No suspicious lesions. No crepitus or fluctuance. No peri-wound warmth or erythema. No masses.Marland Kitchen. Psychiatric Judgement and insight Intact.. No evidence of depression, anxiety, or agitation.. Notes her lymphedema is better but she continues to have a lot of ulcerated areas with some debris which has been washed out with moist saline gauze and debridement is not possible because it's extremely tender. Between her toes she may have some fungal infection and we will give  her some antifungal powder. Electronic Signature(s) Signed: 01/01/2016 11:49:35 AM By: Evlyn KannerBritto, Daemon Dowty MD, FACS Entered By: Evlyn KannerBritto, Joyann Spidle on 01/01/2016 11:49:34 [name] NationsSILETZKY, Liela ([id number]) -------------------------------------------------------------------------------- Physician Orders Details Patient Name: [name] NationsSILETZKY, Shayley Date of Service: 01/01/2016 10:45 AM Medical Record Number: [id number] Patient Account Number: [id number] Date of Birth/Sex: 01/24/1921 (82 y.o. Female) Treating RN: Curtis Sitesorthy, Joanna Primary Care Physician: Dorothey BasemanBRONSTEIN, DAVID Other Clinician: Referring Physician: Terance HartBRONSTEIN, DAVID Treating Physician/Extender: Rudene ReBritto, Viney Acocella Weeks in Treatment: 8046 Verbal / Phone Orders: No Diagnosis Coding Wound Cleansing Wound #6 Right,Posterior Lower Leg o Cleanse wound with mild soap and water o May Shower, gently pat wound dry prior to applying new dressing. Anesthetic Wound #6 Right,Posterior Lower Leg o Topical Lidocaine 4% cream applied to wound bed prior to debridement Primary Wound Dressing Wound #6 Right,Posterior Lower Leg o Medihoney hydrocolloid o Other: - nystatin powder to foot and between the toes Secondary Dressing Wound #6 Right,Posterior Lower Leg o Gauze, ABD and Kerlix/Conform o XtraSorb - where needed Dressing Change Frequency Wound #6 Right,Posterior Lower Leg o Change dressing every day. Follow-up Appointments Wound #6 Right,Posterior Lower Leg o Return Appointment in 2 weeks. Home Health Wound #6 Right,Posterior Lower Leg o [facility] for patient asap o Home Health Nurse may visit PRN to address patientos wound care needs. o FACE TO FACE ENCOUNTER: MEDICARE and MEDICAID PATIENTS: I certify that this patient is under my care and that I had a face-to-face encounter that meets the physician face-to-face encounter requirements with this patient on this  date. The encounter with the  patient was in whole or in part for the following MEDICAL CONDITION: (primary reason for Home Healthcare) CASANDRA, DALLAIRE ([id number]) MEDICAL NECESSITY: I certify, that based on my findings, NURSING services are a medically necessary home health service. HOME BOUND STATUS: I certify that my clinical findings support that this patient is homebound (i.e., Due to illness or injury, pt requires aid of supportive devices such as crutches, cane, wheelchairs, walkers, the use of special transportation or the assistance of another person to leave their place of residence. There is a normal inability to leave the home and doing so requires considerable and taxing effort. Other absences are for medical reasons / religious services and are infrequent or of short duration when for other reasons). o If current dressing causes regression in wound condition, may D/C ordered dressing product/s and apply Normal Saline Moist Dressing daily until next Wound Healing Center / Other MD appointment. Notify Wound Healing Center of regression in wound condition at [phone]. o Please direct any NON-WOUND related issues/requests for orders to patient's Primary Care Physician Patient Medications Allergies: cephalexin, tramadol, amoxicillin, prednisolone Notifications Medication Indication Start End nystatin 01/01/2016 DOSE topical 100,000 unit/gram powder - powder topical Electronic Signature(s) Signed: 01/01/2016 11:48:07 AM By: Evlyn Kanner MD, FACS Entered By: Evlyn Kanner on 01/01/2016 11:48:07 [name] ([id number]) -------------------------------------------------------------------------------- Problem List Details Patient Name: [name] Date of Service: 01/01/2016 10:45 AM Medical Record Number: [id number] Patient Account Number: [id number] Date of Birth/Sex: 1921-09-14 (82 y.o. 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 with 02/11/2015 Yes fat layer exposed L97.812 Non-pressure chronic ulcer of other part of right lower leg 02/11/2015 Yes with fat layer exposed T81.31XA Disruption of external operation (surgical) wound, not 02/18/2015 Yes elsewhere classified, initial encounter Inactive Problems Resolved Problems Electronic Signature(s) Signed: 01/01/2016 11:48:26 AM By: Evlyn Kanner MD, FACS Entered By: Evlyn Kanner on 01/01/2016 11:48:26 [name] ([id number]) -------------------------------------------------------------------------------- Progress Note Details Patient Name: [name] Date of Service: 01/01/2016 10:45 AM Medical Record Number: [id number] Patient Account Number: [id number] Date of Birth/Sex: [birth year] (82 y.o. Female) Treating RN: Curtis Sites Primary Care Physician: Dorothey Baseman Other Clinician: Referring Physician: Dorothey Baseman Treating Physician/Extender: Rudene Re in Treatment: 48 Subjective Chief Complaint Information obtained from Patient Patient presents to the wound care center for a consult due non healing wound. 82 year old patient with comes with a history of a ulcerated area to the right third toe and the right heel which she's had for about 2 months. History of Present Illness (HPI) The following HPI elements were documented for the patient's wound: Location: right medial heel and right third toe Quality: Patient reports experiencing a dull pain to affected area(s). Severity:  Patient states wound are getting worse. Duration: Patient has had the wound for > 3 months prior to seeking treatment at the wound center Timing: Pain in wound is Intermittent (comes and goes Context: The wound appeared gradually over time Modifying Factors: Consults to this date include:vascular surgeon and several recent surgeries. Associated Signs and Symptoms: Patient reports having foul odor and drainage from the left  below-knee amputation site. A pleasant 82 year old patient who is known to have diabetes mellitus for several years has recently gone through a series of operations with the vascular surgeon Dr. Gilda Crease. In January and February she had several surgeries on the left lower extremity with an attempt to have limb salvage for gangrenous changes of her forefoot. 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 On 01/07/2015 she also had a right lower extremity distal runoff with a angioplasty of the right anterior tibial artery to maximize her blood flow to the foot. She recently had her staples removed at Dr. Marijean Heath office on 01/31/2015 and at that time a right lower extremity duplex was done which showed patent vessels and a patent stent with distal occluded posterior tibial artery. Though the duplex was noncritical the recommendations from the PA at the vascular surgery office was that of a angiogram to be done but the patient said she would rather try some bone care before. The patient has received doxycycline and Cipro in the recent past and she takes oral medications for her diabetes. Other than that I reviewed her list of all her medications. No recent hemoglobin A1c has been done and no recent x-rays of the right foot have been done. 02/18/2015 -- x-ray of the right foot was done on 02/11/2015 and it shows no evidence of acute osteomyelitis of the third toe or of the calcaneus. She had gone to the  vascular surgery office and they had noted that there is dehiscence of the left part of the amputation site of the below-knee wound and they have asked [place] to kindly take over the care of this. There've been doing dressings for the right heel and right third toe. [name], [name] ([id number]) 02/25/2015 -- we have received notes from the vascular group who saw her last on 02/13/2015 and she was seen by the PA Ms. Cleda Daub. She had recommended that the patient continue to follow with [place] for wound care including management of the left BKA stump, which has had dehiscence of the lateral part. They will consider repeating a arterial duplex or angiogram if the right lower extremity does not heal within a reasonable period of time. 03/18/2015 -- he saw the vascular surgeon Dr. Gilda Crease and he has set her up for an angioplasty sometime in the middle of July. she is doing well otherwise. 04/10/2015 -- the patient had a procedure done on 04/08/2015 and this was a right angioplasty of the dorsalis pedis, anterior tibial artery, first superficial femoral artery in its midportion. There was successful intervention with recanalization and in-line flow to the right foot. 04/22/2015 -- the patient was looking rather pale today and the daughter confirms that her hemoglobin was down to 6.9. she is being monitored by her PCP. Her Lasix dose was increased and her edema has gone down significantly. 04/29/2015 -- she had vascular studies done and the ABI on the right is 1.3 and the toe pressures were within normal limits. Her hemoglobin is still around 7 and she refuses to take any blood transfusions for religious reasons. Her potassium was normal. 06/10/2015 -- she has a new blister on her right lateral and posterior part of her leg just in the region where the Kerlix bandages were applied and this may be due to an abrasion. 06/24/2015 -- On her right lower extremity she has developed several more blisters  which look like small pustules and the drain informed  shallow ulcerations. 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 07/01/2015 -- she has an appointment with the PCP this Friday and her vascular surgeon on Monday. Other than that she is on doxycycline and has finished 1 week of treatment. The pustules she had all over have now resolved. 07/08/2015 -- she saw the vascular surgeon who did studies in the office and found that there is poor circulation in the distal lower right leg and has set her up for an angiogram and possible stenting next Tuesday. 07/22/2015 -- on October 18 she was taken up for a successful revascularization of the anterior tibial vessel on the right side. a PTA and stent placement was done to the right anterior tibial artery. 08/19/2015 -- she has broken out with some linear ulceration in the region of her webspaces of her toes and this is something new. 08/26/2015 -- over the last 3 days there was a streak of redness going from her lower extremity towards her toes but she had no fever or discharge from the wounds. 09/02/2015 -- the redness and cellulitis has gone down but she continues to have a lot of edema of the right lower extremity. 10/28/2015 -- over the last week she has been draining a lot of fluid from her right lower extremities and several of the wounds which had healed in the past and now opened up again 11/06/2015 -- she was seen in the vascular office and the right ABI was more than 1.3 which was consistent with noncompressible arteries due to medial calcification and the right great toe pressure was 0.24 and the PPG waveform showed significant decrease. from talking to the family member I believe no procedure has been planned. She was also seen by her PCP was increased her Lasix to 80 mg a day and has ordered some lab work but these reports are not back. [name], [name] ([id number]) 11/20/2015 -- Recent labs done hemoglobin A1c was 6.2, INR was .93, iron-binding  capacity was 310, BMP was within normal limits except for the BUN which was 40 and albumin was 3.1. Her hemoglobin is up to 10.7 with a hematocrit of 34.2 he was advised lymphedema pumps by Dr. Gilda Crease and she has not been wearing these. Her daughter-in- law also says that she has been very noncompliant about elevation of her limbs. 12/18/2015 -- she was seen by Dr. Gilda Crease who agreed with the management and did not think she required any intervention. He discussed elevation and use of lymph pump and will see her back as required. Objective Constitutional Pulse regular. Respirations normal and unlabored. Afebrile. Vitals Time Taken: 10:47 AM, Height: 62 in, Weight: 155 lbs, BMI: 28.3, Temperature: 97.7 F, Pulse: 62 bpm, Respiratory Rate: 16 breaths/min, Blood Pressure: 101/53 mmHg. Eyes Nonicteric. Reactive to light. Ears, Nose, Mouth, and Throat Lips, teeth, and gums WNL.Marland Kitchen Moist mucosa without lesions. Neck supple and nontender. No palpable supraclavicular or cervical adenopathy. Normal sized without goiter. Respiratory WNL. No retractions.. Cardiovascular Pedal Pulses WNL. No clubbing, cyanosis or edema. Lymphatic No adneopathy. No adenopathy. No adenopathy. Musculoskeletal Adexa without tenderness or enlargement.. Digits and nails w/o clubbing, cyanosis, infection, petechiae, ischemia, or inflammatory conditions.Marland Kitchen Psychiatric Judgement and insight Intact.. No evidence of depression, anxiety, or agitation.. General Notes: her lymphedema is better but she continues to have a lot of ulcerated areas with some [name], [name] ([id number]) debris which has been washed out with moist saline gauze and debridement is not possible because it's extremely tender. Between her toes she may have some fungal infection  and we will give her some antifungal powder. Integumentary (Hair, Skin) No suspicious lesions. No crepitus or fluctuance. No peri-wound warmth or erythema. No masses.. Wound #6  status is Open. Original cause of wound was Blister. The wound is located on the Right,Posterior Lower Leg. The wound measures 11cm length x 15cm width x 0.2cm depth; 129.591cm^2 area and 25.918cm^3 volume. The wound is limited to skin breakdown. There is no tunneling or undermining noted. There is a large amount of serous drainage noted. The wound margin is flat and intact. There is no granulation within the wound bed. There is a large (67-100%) amount of necrotic tissue within the wound bed including Adherent Slough. The periwound skin appearance exhibited: Localized Edema, Moist. The periwound skin appearance did not exhibit: Callus, Crepitus, Excoriation, Fluctuance, Friable, Induration, Rash, Scarring, Dry/Scaly, Maceration, Atrophie Blanche, Cyanosis, Ecchymosis, Hemosiderin Staining, Mottled, Pallor, Rubor, Erythema. Periwound temperature was noted as No Abnormality. The periwound has tenderness on palpation. Wound #9 status is Healed - Epithelialized. Original cause of wound was Gradually Appeared. The wound is located on the Right,Anterior Lower Leg. The wound measures 0cm length x 0cm width x 0cm depth; 0cm^2 area and 0cm^3 volume. 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 Anesthetic: Wound #6 Right,Posterior Lower Leg: [name], [name]  ([id number]) Topical Lidocaine 4% cream applied to wound bed prior to debridement Primary Wound Dressing: Wound #6 Right,Posterior Lower Leg: Medihoney hydrocolloid Other: - nystatin powder to foot and between the toes Secondary Dressing: Wound #6 Right,Posterior Lower Leg: Gauze, ABD and Kerlix/Conform XtraSorb - where needed Dressing Change Frequency: Wound #6 Right,Posterior Lower Leg: Change dressing every day. Follow-up Appointments: Wound #6 Right,Posterior Lower Leg: Return Appointment in 2 weeks. Home Health: Wound #6 Right,Posterior Lower Leg: Continue Home Health Visits - [facility] please provide medihoney pads for patient asap Home Health Nurse may visit PRN to address patient s wound care needs. FACE TO FACE ENCOUNTER: MEDICARE and MEDICAID PATIENTS: I certify that this patient is under my care and that I had a face-to-face encounter that meets the physician face-to-face encounter requirements with this patient on this date. The encounter with the patient was in whole or in part for the following MEDICAL CONDITION: (primary reason for Home Healthcare) MEDICAL NECESSITY: I certify, that based on my findings, NURSING services are a medically necessary home health service. HOME BOUND STATUS: I certify that my clinical findings support that this patient is homebound (i.e., Due to illness or injury, pt requires aid of supportive devices such as crutches, cane, wheelchairs, walkers, the use of special transportation or the assistance of another person to leave their place of residence. There is a normal inability to leave the home and doing so requires considerable and taxing effort. Other absences are for medical reasons / religious services and are infrequent or of short duration when for other reasons). If current dressing causes regression in wound condition, may D/C ordered dressing product/s and  apply Normal Saline Moist Dressing daily until next Wound Healing Center / Other MD  appointment. Notify Wound Healing Center of regression in wound condition at [phone]. Please direct any NON-WOUND related issues/requests for orders to patient's Primary Care Physician The following medication(s) was prescribed: nystatin topical 100,000 unit/gram powder powder topical starting 01/01/2016 I have recommended some nystatin powder to be applied between her toes and on the dorsum of forefoot. For the right lower extremity we will use Medihoney pads to be applied and changed on alternate days. Her daughter-in-law who was her caregiver will keep [place] posted as to how she does over the next week. Electronic Signature(s) Signed: 01/01/2016 11:50:49 AM By: Evlyn Kanner MD, FACS [name], [name] ([id number]) Entered By: Evlyn Kanner on 01/01/2016 11:50:49 [name] ([id number]) -------------------------------------------------------------------------------- SuperBill Details Patient Name: [name] Date of Service: 01/01/2016 Medical Record Number: [id number] Patient Account Number: [id number] Date of Birth/Sex: 03-Oct-1920 (82 y.o. 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 L97.812 Non-pressure chronic ulcer of other part of right lower leg with fat layer exposed Disruption of external operation (surgical) wound, not elsewhere classified, initial T81.31XA encounter Facility Procedures CPT4 Code: [id number] Description: 99213 - WOUND CARE VISIT-LEV 3 EST PT Modifier: Quantity: 1 Physician  Procedures CPT4: Description Modifier Quantity Code [id number] 99213 - WC PHYS LEVEL 3 - EST PT 1 ICD-10 Description Diagnosis E11.621 Type 2 diabetes mellitus with foot ulcer I70.235 Atherosclerosis of native arteries of right leg with ulceration of other part of  foot 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

## 2016-01-15 ENCOUNTER — Encounter: Payer: Medicare Other | Admitting: Surgery

## 2016-01-15 DIAGNOSIS — E11621 Type 2 diabetes mellitus with foot ulcer: Secondary | ICD-10-CM | POA: Diagnosis not present

## 2016-01-15 NOTE — Progress Notes (Addendum)
EMMAJANE, ALTAMURA ([id number]) Visit Report for 01/15/2016 Chief Complaint Document Details Patient Name: [name], [name] Date of Service: 01/15/2016 10:00 AM Medical Record Number: [id number] Patient Account Number: [id number] Date of Birth/Sex: [birth year] (82 y.o. Female) Treating RN: Curtis Sites Primary Care Physician: Dorothey Baseman Other Clinician: Referring Physician: Dorothey Baseman Treating Physician/Extender: Rudene Re in Treatment: 30 Information Obtained from: Patient Chief Complaint Patient presents to the wound care center for a consult due non healing wound. 82 year old patient with comes with a history of a ulcerated area to the right third toe and the right heel which she's had for about 2 months. Electronic Signature(s) Signed: 01/15/2016 11:04:01 AM By: Evlyn Kanner MD, FACS Entered By: Evlyn Kanner on 01/15/2016 11:04:01 [name] ([id number]) -------------------------------------------------------------------------------- HPI Details Patient Name: [name] Date of Service: 01/15/2016 10:00 AM Medical Record Number: [id number] Patient Account Number: [id number] Date of Birth/Sex: 1921/02/11 (82 y.o. Female) Treating RN: Curtis Sites Primary Care Physician: Dorothey Baseman Other Clinician: Referring Physician: Dorothey Baseman Treating Physician/Extender: Rudene Re in Treatment: 48 History of Present Illness Location: right medial heel and right third toe Quality: Patient reports experiencing a dull pain to affected area(s). Severity: Patient states wound are getting worse. Duration: Patient has had the wound for > 3 months prior to seeking treatment at the wound center Timing: Pain in wound is Intermittent (comes and goes Context: The wound appeared gradually over time Modifying Factors: Consults to this date include:vascular surgeon and several recent surgeries. Associated Signs and Symptoms: Patient reports having foul odor  and drainage from the [name]. HPI Description: A pleasant 82 year old patient who is known to have diabetes mellitus for several years has recently gone through a series of operations with the vascular surgeon Dr. Gilda Crease. In January and February she had several surgeries on [name]. 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 On 01/07/2015 she also had a right lower extremity distal runoff with a angioplasty of the right anterior tibial artery to maximize her blood flow to the foot. She recently had her staples removed at Dr. Marijean Heath office on 01/31/2015 and at that time a right lower extremity duplex was done which showed patent vessels and a patent stent with distal occluded posterior tibial artery. Though the duplex was noncritical the recommendations from the PA at the vascular surgery office was that of a angiogram to be done but the patient said she would rather try some bone care before. The patient has received doxycycline and Cipro in the recent past and she takes oral medications for her diabetes. Other than that I reviewed her list of all her medications. No recent hemoglobin A1c has been done and no recent x-rays of the right foot have been done. 02/18/2015 -- x-ray of the right foot was done on 02/11/2015 and it shows no evidence of acute osteomyelitis of the third toe or of the calcaneus. She had gone to the vascular surgery office and they had noted that there is dehiscence of [name] of the below-knee wound and they have asked [place] to kindly take over the care of this. There've been doing dressings for the right heel and right third toe. 02/25/2015 -- we have received notes from the vascular group who saw her last on 02/13/2015 and she was seen by the PA Ms.  Cleda DaubKimberly Stegmayer. She had recommended that the patient continue to follow with us for wound care including management of [name], which has had dehiscence of the lateral part. They will consider repeating a arterial duplex or angiogram if the right lower extremity does not heal within a reasonable period of time. 03/18/2015 -- he saw the vascular surgeon Dr. Gilda CreaseSchnier and he has set her up for an angioplasty sometime in the middle of July. she is doing well otherwise. [name], [name] ([id number]) 04/10/2015 -- the patient had a procedure done on 04/08/2015 and this was a right angioplasty of the dorsalis pedis, anterior tibial artery, first superficial femoral artery in its midportion. There was successful intervention with recanalization and in-line flow to the right foot. 04/22/2015 -- the patient was looking rather pale today and the daughter confirms that her hemoglobin was down to 6.9. she is being monitored by her PCP. Her Lasix dose was increased and her edema has gone down significantly. 04/29/2015 -- she had vascular studies done and the ABI on the right is 1.3 and the toe pressures were within normal limits. Her hemoglobin is still around 7 and she refuses to take any blood transfusions for religious reasons. Her potassium was normal. 06/10/2015 -- she has a new blister on her right lateral and posterior part of her leg just in the region where the Kerlix bandages were applied and this may be due to an abrasion. 06/24/2015 -- On her right lower extremity she has developed several more blisters which look like small pustules and the drain informed shallow ulcerations. 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 07/01/2015 -- she has an appointment with the PCP this Friday and her vascular surgeon on Monday. Other than that she is on doxycycline and has finished 1 week of treatment. The pustules she had all over have now resolved. 07/08/2015 -- she saw the vascular surgeon who did  studies in the office and found that there is poor circulation in the distal lower right leg and has set her up for an angiogram and possible stenting next Tuesday. 07/22/2015 -- on October 18 she was taken up for a successful revascularization of the anterior tibial vessel on the right side. a PTA and stent placement was done to the right anterior tibial artery. 08/19/2015 -- she has broken out with some linear ulceration in the region of her webspaces of her toes and this is something new. 08/26/2015 -- over the last 3 days there was a streak of redness going from her lower extremity towards her toes but she had no fever or discharge from the wounds. 09/02/2015 -- the redness and cellulitis has gone down but she continues to have a lot of edema of the right lower extremity. 10/28/2015 -- over the last week she has been draining a lot of fluid from her right lower extremities and several of the wounds which had healed in the past and now opened up again 11/06/2015 -- she was seen in the vascular office and the right ABI was more than 1.3 which was consistent with noncompressible arteries due to medial calcification and the right great toe pressure was 0.24 and the PPG waveform showed significant decrease. from talking to the family member I believe no procedure has been planned. She was also seen by her PCP was increased her Lasix to 80 mg a day and has ordered some lab work but these reports are not back. 11/20/2015 -- Recent labs done hemoglobin A1c was 6.2, INR was .93,  iron-binding capacity was 310, BMP was within normal limits except for the BUN which was 40 and albumin was 3.1. Her hemoglobin is up to 10.7 with a hematocrit of 34.2 he was advised lymphedema pumps by Dr. Gilda Crease and she has not been wearing these. Her daughter-in- law also says that she has been very noncompliant about elevation of her limbs. 12/18/2015 -- she was seen by Dr. Gilda Crease who agreed with the management and  did not think she required any intervention. He discussed elevation and use of lymph pump and will see her back as required. 01/15/2016 -- we have been seeing help me every other week and from what I understand from the family [name], [name] ([id number]) member she is quite stubborn and noncompliant and does not use any elevation of follow the advice saying most medications and applications are causing her pain. Electronic Signature(s) Signed: 01/15/2016 11:04:46 AM By: Evlyn Kanner MD, FACS Entered By: Evlyn Kanner on 01/15/2016 11:04:46 [name] ([id number]) -------------------------------------------------------------------------------- Physical Exam Details Patient Name: [name] Date of Service: 01/15/2016 10:00 AM Medical Record Number: [id number] Patient Account Number: [id number] Date of Birth/Sex: [birth year] (82 y.o. Female) Treating RN: Curtis Sites Primary Care Physician: Dorothey Baseman Other Clinician: Referring Physician: Dorothey Baseman Treating Physician/Extender: Rudene Re in Treatment: 48 Constitutional . Pulse regular. Respirations normal and unlabored. Afebrile. . Eyes Nonicteric. Reactive to light. Ears, Nose, Mouth, and Throat Lips, teeth, and gums WNL.Marland Kitchen Moist mucosa without lesions. Neck supple and nontender. No palpable supraclavicular or cervical adenopathy. Normal sized without goiter. Respiratory WNL. No retractions.. Breath sounds WNL, No rubs, rales, rhonchi, or wheeze.. Cardiovascular Heart rhythm and rate regular, no murmur or gallop.. Pedal Pulses WNL. No clubbing, cyanosis or edema. Chest Breasts symmetical and no nipple discharge.. Breast tissue WNL, no masses, lumps, or tenderness.. Lymphatic No adneopathy. No adenopathy. No adenopathy. Musculoskeletal Adexa without tenderness or enlargement.. Digits and nails w/o clubbing, cyanosis, infection, petechiae, ischemia, or inflammatory conditions.. Integumentary (Hair,  Skin) No suspicious lesions. No crepitus or fluctuance. No peri-wound warmth or erythema. No masses.Marland Kitchen Psychiatric Judgement and insight Intact.. No evidence of depression, anxiety, or agitation.. Notes she continues to have a lot of lymphedema and weeping from the lower extremity on the right side with significant amount of micro-ulcerations and debris specially in the posterior lower third. She also has a florid fungal infection and she has not been treating this as she says nystatin powder causes her discomfort Electronic Signature(s) Signed: 01/15/2016 11:05:42 AM By: Evlyn Kanner MD, FACS Entered By: Evlyn Kanner on 01/15/2016 11:05:42 [name] ([id number]) -------------------------------------------------------------------------------- Physician Orders Details Patient Name: [name] Date of Service: 01/15/2016 10:00 AM Medical Record Number: [id number] Patient Account Number: [id number] Date of Birth/Sex: 27-Apr-1921 (82 y.o. Female) Treating RN: Curtis Sites Primary Care Physician: Dorothey Baseman Other Clinician: Referring Physician: Dorothey Baseman Treating Physician/Extender: Rudene Re in Treatment: 33 Verbal / Phone Orders: Yes Clinician: Curtis Sites Read Back and Verified: Yes Diagnosis Coding Wound Cleansing Wound #11 Right Toe Second o Cleanse wound with mild soap and water o May Shower, gently pat wound dry prior to applying new dressing. Wound #6 Right,Posterior Lower Leg o Cleanse wound with mild soap and water o May Shower, gently pat wound dry prior to applying new dressing. Anesthetic Wound #11 Right Toe Second o Topical Lidocaine 4% cream applied to wound bed prior to debridement Wound #6 Right,Posterior Lower Leg o Topical Lidocaine 4% cream applied to wound bed prior to debridement Primary Wound Dressing Wound #11 Right Toe  Second o Aquacel Ag o Other: - lotrisone cream Wound #6 Right,Posterior Lower Leg o  Aquacel Ag o Other: - lotrisone cream Secondary Dressing Wound #11 Right Toe Second o Gauze, ABD and Kerlix/Conform o XtraSorb - where needed Wound #6 Right,Posterior Lower Leg o Gauze, ABD and Kerlix/Conform o XtraSorb - where needed Dressing Change Frequency Wound #11 Right Toe Second Reynolds, Safiyya ([id number]) o Change dressing every day. Wound #6 Right,Posterior Lower Leg o Change dressing every day. Follow-up Appointments Wound #11 Right Toe Second o Return Appointment in 1 week. Wound #6 Right,Posterior Lower Leg o Return Appointment in 1 week. Home Health Wound #11 Right Toe Second o Continue Home Health Visits - [facility] please provide appropriate supplies for patient o Home Health Nurse may visit PRN to address patientos wound care needs. o FACE TO FACE ENCOUNTER: MEDICARE and MEDICAID PATIENTS: I certify that this patient is under my care and that I had a face-to-face encounter that meets the physician face-to-face encounter requirements with this patient on this date. The encounter with the patient was in whole or in part for the following MEDICAL CONDITION: (primary reason for Home Healthcare) MEDICAL NECESSITY: I certify, that based on my findings, NURSING services are a medically necessary home health service. HOME BOUND STATUS: I certify that my clinical findings support that this patient is homebound (i.e., Due to illness or injury, pt requires aid of supportive devices such as crutches, cane, wheelchairs, walkers, the use of special transportation or the assistance of another person to leave their place of residence. There is a normal inability to leave the home and doing so requires considerable and taxing effort. Other absences are for medical reasons / religious services and are infrequent or of short duration when for other reasons). o If current dressing causes regression in wound condition, may D/C ordered dressing product/s and apply  Normal Saline Moist Dressing daily until next Wound Healing Center / Other MD appointment. Notify Wound Healing Center of regression in wound condition at [phone]. o Please direct any NON-WOUND related issues/requests for orders to patient's Primary Care Physician Wound #6 Right,Posterior Lower Leg o Continue Home Health Visits - [facility] please provide appropriate supplies for patient o Home Health Nurse may visit PRN to address patientos wound care needs. o FACE TO FACE ENCOUNTER: MEDICARE and MEDICAID PATIENTS: I certify that this patient is under my care and that I had a face-to-face encounter that meets the physician face-to-face encounter requirements with this patient on this date. The encounter with the patient was in whole or in part for the following MEDICAL CONDITION: (primary reason for Home Healthcare) MEDICAL NECESSITY: I certify, that based on my findings, NURSING services are a medically necessary home health service. HOME BOUND STATUS: I certify that my clinical findings support that this patient is homebound (i.e., Due to illness or injury, pt requires aid of supportive devices such as crutches, cane, wheelchairs, walkers, the use of special transportation or the assistance of another person to leave their place of residence. There is a normal inability to leave the home and doing so requires considerable and taxing effort. Other absences are for medical reasons / religious services and are infrequent or of short duration when for other reasons). [name], [name] ([id number]) o If current dressing causes regression in wound condition, may D/C ordered dressing product/s and apply Normal Saline Moist Dressing daily until next Wound Healing Center / Other MD appointment. Notify Wound Healing Center of regression in wound condition at [phone]. o Please direct  any NON-WOUND related issues/requests for orders to patient's Primary Care Physician Patient  Medications Allergies: cephalexin, tramadol, amoxicillin, prednisolone Notifications Medication Indication Start End Lotrisone 01/15/2016 DOSE topical 1 %-0.05 % cream - cream topical aplly as directed daily Electronic Signature(s) Signed: 01/15/2016 11:03:35 AM By: Evlyn Kanner MD, FACS Entered By: Evlyn Kanner on 01/15/2016 11:03:33 [name] ([id number]) -------------------------------------------------------------------------------- Problem List Details Patient Name: [name] Date of Service: 01/15/2016 10:00 AM Medical Record Number: [id number] Patient Account Number: [id number] Date of Birth/Sex: [birth year] (82 y.o. 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 02/11/2015 Yes fat layer exposed L97.812 Non-pressure chronic ulcer of other part of right lower leg 02/11/2015 Yes with fat layer exposed T81.31XA Disruption of external operation (surgical) wound, not 02/18/2015 Yes elsewhere classified, initial encounter Inactive Problems Resolved Problems Electronic Signature(s) Signed: 01/15/2016 11:03:53 AM By: Evlyn Kanner MD, FACS Entered By: Evlyn Kanner on 01/15/2016 11:03:53 [name] ([id number]) -------------------------------------------------------------------------------- Progress Note Details Patient Name: [name] Date of Service: 01/15/2016 10:00 AM Medical Record Number: [id number] Patient Account Number: [id number] Date of  Birth/Sex: Oct 26, 1920 (82 y.o. Female) Treating RN: Curtis Sites Primary Care Physician: Dorothey Baseman Other Clinician: Referring Physician: Dorothey Baseman Treating Physician/Extender: Rudene Re in Treatment: 54 Subjective Chief Complaint Information obtained from Patient Patient presents to the wound care center for a consult due non healing wound. 82 year old patient with comes with a history of a ulcerated area to the right third toe and the right heel which she's had for about 2 months. History of Present Illness (HPI) The following HPI elements were documented for the patient's wound: Location: right medial heel and right third toe Quality: Patient reports experiencing a dull pain to affected area(s). Severity: Patient states wound are getting worse. Duration: Patient has had the wound for > 3 months prior to seeking treatment at the wound center Timing: Pain in wound is Intermittent (comes and goes Context: The wound appeared gradually over time Modifying Factors: Consults to this date include:vascular surgeon and several recent surgeries. Associated Signs and Symptoms: Patient reports having foul odor and drainage from the [name]. A pleasant 82 year old patient who is known to have diabetes mellitus for several years has recently gone through a series of operations with the vascular surgeon Dr. Gilda Crease. In January and February she had several surgeries on [name]. 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 On 01/07/2015 she also had a right lower extremity distal runoff with a angioplasty of the right anterior tibial artery to maximize her blood flow to the foot. She recently had her staples removed at Dr. Marijean Heath office on 01/31/2015 and at that time a right  lower extremity duplex was done which showed patent vessels and a patent stent with distal occluded posterior tibial artery. Though the duplex was noncritical the recommendations from the PA at the vascular surgery office was that of a angiogram to be done but the patient said  she would rather try some bone care before. The patient has received doxycycline and Cipro in the recent past and she takes oral medications for her diabetes. Other than that I reviewed her list of all her medications. No recent hemoglobin A1c has been done and no recent x-rays of the right foot have been done. 02/18/2015 -- x-ray of the right foot was done on 02/11/2015 and it shows no evidence of acute osteomyelitis of the third toe or of the calcaneus. She had gone to the vascular surgery office and they had noted that there is dehiscence of [name] of the below-knee wound and they have asked [place] to kindly take over the care of this. There've been doing dressings for the right heel and right third toe. [name], [name] ([id number]) 02/25/2015 -- we have received notes from the vascular group who saw her last on 02/13/2015 and she was seen by the PA Ms. Cleda Daub. She had recommended that the patient continue to follow with [place] for wound care including management of [name], which has had dehiscence of the lateral part. They will consider repeating a arterial duplex or angiogram if the right lower extremity does not heal within a reasonable period of time. 03/18/2015 -- he saw the vascular surgeon Dr. Gilda Crease and he has set her up for an angioplasty sometime in the middle of July. she is doing well otherwise. 04/10/2015 -- the patient had a procedure done on 04/08/2015 and this was a right angioplasty of the dorsalis pedis, anterior tibial artery, first superficial femoral artery in its midportion. There was successful intervention with recanalization and in-line flow to the  right foot. 04/22/2015 -- the patient was looking rather pale today and the daughter confirms that her hemoglobin was down to 6.9. she is being monitored by her PCP. Her Lasix dose was increased and her edema has gone down significantly. 04/29/2015 -- she had vascular studies done and the ABI on the right is 1.3 and the toe pressures were within normal limits. Her hemoglobin is still around 7 and she refuses to take any blood transfusions for religious reasons. Her potassium was normal. 06/10/2015 -- she has a new blister on her right lateral and posterior part of her leg just in the region where the Kerlix bandages were applied and this may be due to an abrasion. 06/24/2015 -- On her right lower extremity she has developed several more blisters which look like small pustules and the drain informed shallow ulcerations. 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 07/01/2015 -- she has an appointment with the PCP this Friday and her vascular surgeon on Monday. Other than that she is on doxycycline and has finished 1 week of treatment. The pustules she had all over have now resolved. 07/08/2015 -- she saw the vascular surgeon who did studies in the office and found that there is poor circulation in the distal lower right leg and has set her up for an angiogram and possible stenting next Tuesday. 07/22/2015 -- on October 18 she was taken up for a successful revascularization of the anterior tibial vessel on the right side. a PTA and stent placement was done to the right anterior tibial artery. 08/19/2015 -- she has broken out with some linear ulceration in the region of her webspaces of her toes and this is something new. 08/26/2015 -- over the last 3 days there was a streak of redness going from her lower extremity towards her toes but she had  no fever or discharge from the wounds. 09/02/2015 -- the redness and cellulitis has gone down but she continues to have a lot of edema of the right lower  extremity. 10/28/2015 -- over the last week she has been draining a lot of fluid from her right lower extremities and several of the wounds which had healed in the past and now opened up again 11/06/2015 -- she was seen in the vascular office and the right ABI was more than 1.3 which was consistent with noncompressible arteries due to medial calcification and the right great toe pressure was 0.24 and the PPG waveform showed significant decrease. from talking to the family member I believe no procedure has been planned. She was also seen by her PCP was increased her Lasix to 80 mg a day and has ordered some lab work but these reports are not back. [name], [name] ([id number]) 11/20/2015 -- Recent labs done hemoglobin A1c was 6.2, INR was .93, iron-binding capacity was 310, BMP was within normal limits except for the BUN which was 40 and albumin was 3.1. Her hemoglobin is up to 10.7 with a hematocrit of 34.2 he was advised lymphedema pumps by Dr. Gilda Crease and she has not been wearing these. Her daughter-in- law also says that she has been very noncompliant about elevation of her limbs. 12/18/2015 -- she was seen by Dr. Gilda Crease who agreed with the management and did not think she required any intervention. He discussed elevation and use of lymph pump and will see her back as required. 01/15/2016 -- we have been seeing help me every other week and from what I understand from the family member she is quite stubborn and noncompliant and does not use any elevation of follow the advice saying most medications and applications are causing her pain. Objective Constitutional Pulse regular. Respirations normal and unlabored. Afebrile. Vitals Time Taken: 10:19 AM, Height: 62 in, Weight: 155 lbs, BMI: 28.3, Pulse: 44 bpm, Respiratory Rate: 16 breaths/min, Blood Pressure: 131/48 mmHg. Eyes Nonicteric. Reactive to light. Ears, Nose, Mouth, and Throat Lips, teeth, and gums WNL.Marland Kitchen Moist mucosa without  lesions. Neck supple and nontender. No palpable supraclavicular or cervical adenopathy. Normal sized without goiter. Respiratory WNL. No retractions.. Breath sounds WNL, No rubs, rales, rhonchi, or wheeze.. Cardiovascular Heart rhythm and rate regular, no murmur or gallop.. Pedal Pulses WNL. No clubbing, cyanosis or edema. Chest Breasts symmetical and no nipple discharge.. Breast tissue WNL, no masses, lumps, or tenderness.. Lymphatic No adneopathy. No adenopathy. No adenopathy. Musculoskeletal Adexa without tenderness or enlargement.. Digits and nails w/o clubbing, cyanosis, infection, petechiae, Poteet, Brandice ([id number]) ischemia, or inflammatory conditions.Marland Kitchen Psychiatric Judgement and insight Intact.. No evidence of depression, anxiety, or agitation.. General Notes: she continues to have a lot of lymphedema and weeping from the lower extremity on the right side with significant amount of micro-ulcerations and debris specially in the posterior lower third. She also has a florid fungal infection and she has not been treating this as she says nystatin powder causes her discomfort Integumentary (Hair, Skin) No suspicious lesions. No crepitus or fluctuance. No peri-wound warmth or erythema. No masses.. Wound #11 status is Open. Original cause of wound was Not Known. The wound is located on the Right Toe Second. The wound measures 0.7cm length x 0.8cm width x 0.2cm depth; 0.44cm^2 area and 0.088cm^3 volume. The wound is limited to skin breakdown. There is no tunneling or undermining noted. There is a large amount of serous drainage noted. The wound margin is flat and intact. There  is no granulation within the wound bed. There is a large (67-100%) amount of necrotic tissue within the wound bed including Eschar and Adherent Slough. The periwound skin appearance exhibited: Localized Edema, Maceration, Moist, Erythema. The periwound skin appearance did not exhibit: Callus, Crepitus,  Excoriation, Fluctuance, Friable, Induration, Rash, Scarring, Dry/Scaly, Atrophie Blanche, Cyanosis, Ecchymosis, Hemosiderin Staining, Mottled, Pallor, Rubor. The surrounding wound skin color is noted with erythema which is circumferential. Periwound temperature was noted as No Abnormality. The periwound has tenderness on palpation. Wound #6 status is Open. Original cause of wound was Blister. The wound is located on the Right,Posterior Lower Leg. The wound measures 12cm length x 15cm width x 0.2cm depth; 141.372cm^2 area and 28.274cm^3 volume. The wound is limited to skin breakdown. There is no tunneling or undermining noted. There is a large amount of serous drainage noted. The wound margin is flat and intact. There is no granulation within the wound bed. There is a large (67-100%) amount of necrotic tissue within the wound bed including Adherent Slough. The periwound skin appearance exhibited: Localized Edema, Moist. The periwound skin appearance did not exhibit: Callus, Crepitus, Excoriation, Fluctuance, Friable, Induration, Rash, Scarring, Dry/Scaly, Maceration, Atrophie Blanche, Cyanosis, Ecchymosis, Hemosiderin Staining, Mottled, Pallor, Rubor, Erythema. Periwound temperature was noted as No Abnormality. The periwound has tenderness on palpation. 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 Again discussed with her the need for elevation of her limb and to follow our advice. I have also given her an option to get a second opinion from another wound clinic possibly [facility]. She says she is not interested in this. I have recommended Lotrisone ointment to be applied between her toes and on the dorsum of forefoot and the lower leg. For the right lower extremity we will use Medihoney pads all she does not like this Aquacel Ag will be fine to be applied and changed on alternate days. Her daughter-in-law who was her caregiver will keep [place] posted as to how she does over the next week.I have asked her to return in a week Plan Wound Cleansing: Wound #11 Right Toe Second: Cleanse wound with mild soap and water May Shower, gently pat wound dry prior to applying new dressing. Wound #6 Right,Posterior Lower Leg: Cleanse wound with mild soap and water May Shower, gently pat wound dry prior to applying new dressing. Anesthetic: Wound #11 Right Toe Second: Topical Lidocaine 4% cream applied to wound bed prior to debridement Wound #6 Right,Posterior Lower Leg: Topical Lidocaine 4% cream applied to wound bed prior to debridement Primary Wound Dressing: Wound #11 Right Toe Second: Aquacel Ag Other: - lotrisone cream Wound #6 Right,Posterior Lower Leg: Aquacel Ag Other: - lotrisone cream Secondary Dressing: [name], [name] ([id number]) Wound #11 Right Toe Second: Gauze, ABD and Kerlix/Conform XtraSorb - where needed Wound #6 Right,Posterior Lower Leg: Gauze, ABD and Kerlix/Conform XtraSorb - where needed Dressing Change Frequency: Wound #11 Right Toe Second: Change dressing  every day. Wound #6 Right,Posterior Lower Leg: Change dressing every day. Follow-up Appointments: Wound #11 Right Toe Second: Return Appointment in 1 week. Wound #6 Right,Posterior Lower Leg: Return Appointment in 1 week. Home Health: Wound #11 Right Toe  Second: Continue Home Health Visits - [facility] please provide appropriate supplies for patient Home Health Nurse may visit PRN to address patient s wound care needs. FACE TO FACE ENCOUNTER: MEDICARE and MEDICAID PATIENTS: I certify that this patient is under my care and that I had a face-to-face encounter that meets the physician face-to-face encounter requirements with this patient on this date. The encounter with the patient was in whole or in part for the following MEDICAL CONDITION: (primary reason for Home Healthcare) MEDICAL NECESSITY: I certify, that based on my findings, NURSING services are a medically necessary home health service. HOME BOUND STATUS: I certify that my clinical findings support that this patient is homebound (i.e., Due to illness or injury, pt requires aid of supportive devices such as crutches, cane, wheelchairs, walkers, the use of special transportation or the assistance of another person to leave their place of residence. There is a normal inability to leave the home and doing so requires considerable and taxing effort. Other absences are for medical reasons / religious services and are infrequent or of short duration when for other reasons). If current dressing causes regression in wound condition, may D/C ordered dressing product/s and apply Normal Saline Moist Dressing daily until next Wound Healing Center / Other MD appointment. Notify Wound Healing Center of regression in wound condition at [phone]. Please direct any NON-WOUND related issues/requests for orders to patient's Primary Care Physician Wound #6 Right,Posterior Lower Leg: Continue Home Health Visits - [facility] please provide appropriate supplies for patient Home Health Nurse may visit PRN to address patient s wound care needs. FACE TO FACE ENCOUNTER: MEDICARE and MEDICAID PATIENTS: I certify that this patient is under my care and that I had a face-to-face encounter that meets the physician face-to-face  encounter requirements with this patient on this date. The encounter with the patient was in whole or in part for the following MEDICAL CONDITION: (primary reason for Home Healthcare) MEDICAL NECESSITY: I certify, that based on my findings, NURSING services are a medically necessary home health service. HOME BOUND STATUS: I certify that my clinical findings support that this patient is homebound (i.e., Due to illness or injury, pt requires aid of supportive devices such as crutches, cane, wheelchairs, walkers, the use of special transportation or the assistance of another person to leave their place of residence. There is a normal inability to leave the home and doing so requires considerable and taxing effort. Other absences are for medical reasons / religious services and are infrequent or of short duration when for other reasons). If current dressing causes regression in wound condition, may D/C ordered dressing product/s and apply Normal Saline Moist Dressing daily until next Wound Healing Center / Other MD appointment. Notify Wound Healing Center of regression in wound condition at [phone]. Please direct any NON-WOUND related issues/requests for orders to patient's Primary Care Physician [name], [name] ([id number]) The following medication(s) was prescribed: Lotrisone topical 1 %-0.05 % cream cream topical aplly as directed daily starting 01/15/2016 We have been treating helmet for a long while for palliative care and as usual she's been stubborn and noncompliant. Again discussed with her the need for elevation of her limb and to follow our advice. I have also given her an option to get a second opinion from  another wound clinic possibly [facility]. She says she is not interested in this. I have recommended Lotrisone ointment to be applied between her toes and on the dorsum of forefoot and the lower leg. For the right lower extremity we will use Medihoney pads all  she does not like this Aquacel Ag will be fine to be applied and changed on alternate days. Her daughter-in-law who was her caregiver will keep [place] posted as to how she does over the next week.I have asked her to return in a week Electronic Signature(s) Signed: 01/16/2016 9:22:11 AM By: Evlyn Kanner MD, FACS Previous Signature: 01/15/2016 11:08:13 AM Version By: Evlyn Kanner MD, FACS Entered By: Evlyn Kanner on 01/16/2016 09:22:11 [name] ([id number]) -------------------------------------------------------------------------------- SuperBill Details Patient Name: [name] Date of Service: 01/15/2016 Medical Record Number: [id number] Patient Account Number: [id number] Date of Birth/Sex: [birth year] (82 y.o. 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 L97.812 Non-pressure chronic ulcer of other part of right lower leg with fat layer exposed Disruption of external operation (surgical) wound, not elsewhere classified, initial T81.31XA encounter Facility Procedures CPT4 Code: [id number] Description: 99213 - WOUND CARE VISIT-LEV 3 EST PT Modifier: Quantity: 1 Physician Procedures CPT4: Description Modifier Quantity Code [id number] 99213 - WC PHYS LEVEL 3 - EST PT 1 ICD-10 Description Diagnosis E11.621 Type 2 diabetes mellitus with foot ulcer I70.235 Atherosclerosis of native arteries of right leg with ulceration of other part of  foot L97.412 Non-pressure chronic ulcer of right heel and midfoot with fat layer  exposed L97.812 Non-pressure chronic ulcer of other part of right lower leg with fat layer exposed Electronic Signature(s) Signed: 01/15/2016 11:08:31 AM By: Evlyn Kanner MD, FACS Entered By: Evlyn Kanner on 01/15/2016 11:08:31

## 2016-01-16 NOTE — Progress Notes (Signed)
[name], [name] ([id number]) Visit Report for 01/15/2016 Arrival Information Details Patient Name: [name], [name] Date of Service: 01/15/2016 10:00 AM Medical Record Number: [id number] Patient Account Number: [id number] Date of Birth/Sex: 1921-08-06 (82 y.o. Female) Treating RN: Curtis Sites Primary Care Physician: Dorothey Baseman Other Clinician: Referring Physician: Dorothey Baseman Treating Physician/Extender: Rudene Re in Treatment: 48 Visit Information History Since Last Visit Added or deleted any medications: No Patient Arrived: Wheel Chair Any new allergies or adverse reactions: No Arrival Time: 10:02 Had a fall or experienced change in No activities of daily living that may affect Accompanied By: dtr risk of falls: Transfer Assistance: Manual Signs or symptoms of abuse/neglect since last No Patient Identification Verified: Yes visito Secondary Verification Process Yes Hospitalized since last visit: No Completed: Pain Present Now: Yes Patient Has Alerts: Yes Electronic Signature(s) Signed: 01/15/2016 5:12:38 PM By: Curtis Sites Entered By: Curtis Sites on 01/15/2016 10:03:24 [name] ([id number]) -------------------------------------------------------------------------------- Clinic Level of Care Assessment Details Patient Name: [name] Date of Service: 01/15/2016 10:00 AM Medical Record Number: [id number] Patient Account Number: [id number] Date of Birth/Sex: [birth year] (82 y.o. Female) Treating RN: Curtis Sites Primary Care Physician: Terance Hart, DAVID Other Clinician: Referring Physician: Terance Hart, DAVID Treating Physician/Extender: Rudene Re in Treatment: 48 Clinic Level of Care Assessment Items TOOL 4 Quantity Score []  - Use when only an EandM is performed on FOLLOW-UP visit 0 ASSESSMENTS - Nursing Assessment / Reassessment X - Reassessment of Co-morbidities (includes updates in patient status) 1 10 X - Reassessment of  Adherence to Treatment Plan 1 5 ASSESSMENTS - Wound and Skin Assessment / Reassessment []  - Simple Wound Assessment / Reassessment - one wound 0 X - Complex Wound Assessment / Reassessment - multiple wounds 2 5 []  - Dermatologic / Skin Assessment (not related to wound area) 0 ASSESSMENTS - Focused Assessment []  - Circumferential Edema Measurements - multi extremities 0 []  - Nutritional Assessment / Counseling / Intervention 0 X - Lower Extremity Assessment (monofilament, tuning fork, pulses) 1 5 []  - Peripheral Arterial Disease Assessment (using hand held doppler) 0 ASSESSMENTS - Ostomy and/or Continence Assessment and Care []  - Incontinence Assessment and Management 0 []  - Ostomy Care Assessment and Management (repouching, etc.) 0 PROCESS - Coordination of Care X - Simple Patient / Family Education for ongoing care 1 15 []  - Complex (extensive) Patient / Family Education for ongoing care 0 []  - Staff obtains Chiropractor, Records, Test Results / Process Orders 0 []  - Staff telephones HHA, Nursing Homes / Clarify orders / etc 0 []  - Routine Transfer to another Facility (non-emergent condition) 0 [name], [name] ([id number]) []  - Routine Hospital Admission (non-emergent condition) 0 []  - New Admissions / Manufacturing engineer / Ordering NPWT, Apligraf, etc. 0 []  - Emergency Hospital Admission (emergent condition) 0 X - Simple Discharge Coordination 1 10 []  - Complex (extensive) Discharge Coordination 0 PROCESS - Special Needs []  - Pediatric / Minor Patient Management 0 []  - Isolation Patient Management 0 []  - Hearing / Language / Visual special needs 0 []  - Assessment of Community assistance (transportation, D/C planning, etc.) 0 []  - Additional assistance / Altered mentation 0 []  - Support Surface(s) Assessment (bed, cushion, seat, etc.) 0 INTERVENTIONS - Wound Cleansing / Measurement []  - Simple Wound Cleansing - one wound 0 X - Complex Wound Cleansing - multiple wounds 2 5 X -  Wound Imaging (photographs - any number of wounds) 1 5 []  - Wound Tracing (instead of photographs) 0 []  - Simple Wound Measurement - one wound 0 X -  Complex Wound Measurement - multiple wounds 2 5 INTERVENTIONS - Wound Dressings  - Small Wound Dressing one or multiple wounds 0 X - Medium Wound Dressing one or multiple wounds 2 15  - Large Wound Dressing one or multiple wounds 0  - Application of Medications - topical 0  - Application of Medications - injection 0 INTERVENTIONS - Miscellaneous  - External ear exam 0 [name], [name] ([id number])  - Specimen Collection (cultures, biopsies, blood, body fluids, etc.) 0  - Specimen(s) / Culture(s) sent or taken to Lab for analysis 0  - Patient Transfer (multiple staff / Michiel Sites Lift / Similar devices) 0  - Simple Staple / Suture removal (25 or less) 0  - Complex Staple / Suture removal (26 or more) 0  - Hypo / Hyperglycemic Management (close monitor of Blood Glucose) 0  - Ankle / Brachial Index (ABI) - do not check if billed separately 0 X - Vital Signs 1 5 Has the patient been seen at the hospital within the last three years: Yes Total Score: 115 Level Of Care: New/Established - Level 3 Electronic Signature(s) Signed: 01/15/2016 5:12:38 PM By: Curtis Sites Entered By: Curtis Sites on 01/15/2016 10:49:33 [name] ([id number]) -------------------------------------------------------------------------------- Encounter Discharge Information Details Patient Name: [name] Date of Service: 01/15/2016 10:00 AM Medical Record Number: [id number] Patient Account Number: [id number] Date of Birth/Sex: [birth year] (82 y.o. Female) Treating RN: Curtis Sites Primary Care Physician: Dorothey Baseman Other Clinician: Referring Physician: Dorothey Baseman Treating Physician/Extender: Rudene Re in Treatment: 57 Encounter Discharge Information Items Discharge Pain Level: 0 Discharge Condition:  Stable Ambulatory Status: Wheelchair Discharge Destination: Home Transportation: Private Auto Accompanied By: dtr in law Schedule Follow-up Appointment: Yes Medication Reconciliation completed and provided to Patient/Care No [name]: Provided on Clinical Summary of Care: 01/15/2016 Form Type Recipient Paper Patient HS Electronic Signature(s) Signed: 01/15/2016 12:40:34 PM By: Curtis Sites Previous Signature: 01/15/2016 11:08:35 AM Version By: Gwenlyn Perking Entered By: Curtis Sites on 01/15/2016 12:40:34 [name] ([id number]) -------------------------------------------------------------------------------- Multi Wound Chart Details Patient Name: [name] Date of Service: 01/15/2016 10:00 AM Medical Record Number: [id number] Patient Account Number: [id number] Date of Birth/Sex: 1920-12-21 (82 y.o. 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] Comorbid History: [11:Arrhythmia, Deep Vein Thrombosis, Peripheral Arterial Disease, Type II Arterial Disease, Type II Diabetes] [6:Arrhythmia, Deep Vein Thrombosis, Peripheral Diabetes] [N/A:N/A] Date Acquired: [11:01/15/2016] [6:06/12/2015] [N/A:N/A] Weeks of Treatment: [11:0] [6:30] [N/A:N/A] Wound Status: [11:Open] [6:Open] [N/A:N/A] Measurements L x W x D 0.7x0.8x0.2 [6:12x15x0.2] [N/A:N/A] (cm) Area (cm) : [11:0.44] [6:141.372]  [N/A:N/A] Volume (cm) : [11:0.088] [6:28.274] [N/A:N/A] % Reduction in Area: [11:N/A] [6:-17909.20%] [N/A:N/A] % Reduction in Volume: N/A [6:-35689.90%] [N/A:N/A] Classification: [11:Full Thickness Without Exposed Support Structures] [6:Full Thickness Without Exposed Support Structures] [N/A:N/A] HBO Classification: [11:Grade 1] [6:Grade 1] [N/A:N/A] Exudate Amount: [11:Large] [6:Large] [N/A:N/A] Exudate Type: [11:Serous] [6:Serous] [N/A:N/A] Exudate Color: [11:amber] [6:amber] [N/A:N/A] Foul Odor After [11:Yes] [6:No] [N/A:N/A] Cleansing: Odor Anticipated Due to No [6:N/A] [N/A:N/A] Product Use: Wound Margin: [11:Flat and Intact] [6:Flat and Intact] [N/A:N/A] Granulation Amount: None Present (0%) None Present (0%) N/A Necrotic Amount: Large (67-100%) Large (67-100%) N/A Necrotic Tissue:  Eschar, Adherent [employer] N/A Exposed Structures: Fascia: No Fascia: No N/A Fat: No Fat: No Tendon: No Tendon: No Muscle: No Muscle: No Joint: No Joint: No Bone: No Bone: No Limited to Skin Limited to Skin Breakdown Breakdown Epithelialization: None None N/A Periwound Skin Texture: Edema: Yes Edema: Yes N/A Excoriation: No Excoriation: No Induration: No Induration: No Callus: No Callus: No Crepitus: No Crepitus: No Fluctuance: No Fluctuance: No Friable: No Friable: No Rash: No Rash: No Scarring: No Scarring: No Periwound Skin Maceration: Yes Moist: Yes N/A Moisture: Moist: Yes Maceration: No Dry/Scaly: No Dry/Scaly: No Periwound Skin Color: Erythema: Yes Atrophie Blanche: No N/A Atrophie Blanche: No Cyanosis: No Cyanosis: No Ecchymosis: No Ecchymosis: No Erythema: No Hemosiderin Staining: No Hemosiderin Staining: No Mottled: No Mottled: No Pallor: No Pallor: No Rubor: No Rubor: No Erythema Location: Circumferential N/A N/A Temperature: No Abnormality No Abnormality N/A Tenderness on Yes Yes N/A Palpation: Wound Preparation: Ulcer  Cleansing: Ulcer Cleansing: N/A Rinsed/Irrigated with Rinsed/Irrigated with Saline Saline Topical Anesthetic Topical Anesthetic Applied: Other: lidocaine Applied: Other: lidocaine 4% 4% Treatment Notes Electronic Signature(s) Signed: 01/15/2016 5:12:38 PM By: Curtis Sites Entered By: Curtis Sites on 01/15/2016 10:36:42 [name] ([id number]) [name], [name] ([id number]) -------------------------------------------------------------------------------- Multi-Disciplinary Care Plan Details Patient Name: [name] Date of Service: 01/15/2016 10:00 AM Medical Record Number: [id number] Patient Account Number: [id number] Date of Birth/Sex: 1921-07-24 (82 y.o. Female) Treating RN: Curtis Sites Primary Care Physician: Dorothey Baseman Other Clinician: Referring Physician: Dorothey Baseman Treating Physician/Extender: Rudene Re in Treatment: 78 Active Inactive Abuse / Safety / Falls / Self Care Management Nursing Diagnoses: Impaired physical mobility Potential for falls Goals: Patient will remain injury free Date Initiated: 02/11/2015 Goal Status: Active Patient/caregiver will verbalize understanding of skin care regimen Date Initiated: 02/11/2015 Goal Status: Active Patient/caregiver will verbalize/demonstrate measures taken to prevent injury and/or falls Date Initiated: 02/11/2015 Goal Status: Active Patient/caregiver will verbalize/demonstrate understanding of what to do in case of emergency Date Initiated: 02/11/2015 Goal Status: Active Interventions: Assess fall risk on admission and as needed Provide education on fall prevention Provide education on safe transfers Treatment Activities: Patient referred to home care : 01/15/2016 Notes: Nutrition Nursing Diagnoses: Imbalanced nutrition Potential for alteratiion in Nutrition/Potential for imbalanced nutrition Baldo, Kyrstyn ([id number]) Goals: Patient/caregiver verbalizes understanding of need to  maintain therapeutic glucose control per primary care physician Date Initiated: 02/11/2015 Goal Status: Active Patient/caregiver will maintain therapeutic glucose control Date Initiated: 02/11/2015 Goal Status: Active Interventions: Assess HgA1c results as ordered upon admission and as needed Provide education on elevated blood sugars and impact on wound healing Provide education on nutrition Treatment Activities: Education provided on Nutrition : 04/22/2015 Obtain HgA1c : 01/15/2016 Notes: Wound/Skin Impairment Nursing Diagnoses: Impaired tissue integrity Knowledge deficit related to ulceration/compromised skin integrity Goals: Patient/caregiver will verbalize understanding of skin care regimen Date Initiated: 02/11/2015 Goal Status: Active Ulcer/skin breakdown will heal within 14 weeks Date Initiated: 02/11/2015 Goal Status: Active Interventions: Assess patient/caregiver ability to obtain necessary supplies Assess patient/caregiver ability to perform ulcer/skin care regimen upon admission and as needed Assess ulceration(s) every visit Provide education on ulcer and skin care Treatment Activities: Skin care regimen initiated : 01/15/2016 Topical wound management initiated : 01/15/2016 Notes: [name], [name] ([id number]) Electronic Signature(s) Signed: 01/15/2016 5:12:38 PM By: Curtis Sites Entered By: Curtis Sites on 01/15/2016 10:36:10 [name] ([id number]) -------------------------------------------------------------------------------- Pain Assessment Details Patient Name: [name] Date of Service: 01/15/2016 10:00 AM Medical Record Number: [id number] Patient Account Number: [id number] Date of Birth/Sex: 12-28-1920 (82 y.o. Female) Treating RN:  Curtis Sites Primary Care Physician: Terance Hart, DAVID Other Clinician: Referring Physician: Dorothey Baseman Treating Physician/Extender: Rudene Re in Treatment: 57 Active Problems Location of Pain  Severity and Description of Pain Patient Has Paino Yes Site Locations Pain Location: Pain in Ulcers With Dressing Change: Yes Duration of the Pain. Constant / Intermittento Constant Pain Management and Medication Current Pain Management: Electronic Signature(s) Signed: 01/15/2016 5:12:38 PM By: Curtis Sites Entered By: Curtis Sites on 01/15/2016 10:03:42 [name] ([id number]) -------------------------------------------------------------------------------- Patient/Caregiver Education Details Patient Name: [name] Date of Service: 01/15/2016 10:00 AM Medical Record Number: [id number] Patient Account Number: [id number] Date of Birth/Gender: November 09, 1920 (82 y.o. Female) Treating RN: Curtis Sites Primary Care Physician: Dorothey Baseman Other Clinician: Referring Physician: Dorothey Baseman Treating Physician/Extender: Rudene Re in Treatment: 74 Education Assessment Education Provided To: Patient Education Topics Provided Wound/Skin Impairment: Handouts: Other: wound care as ordered Methods: Demonstration, Explain/Verbal Responses: State content correctly Electronic Signature(s) Signed: 01/15/2016 12:41:18 PM By: Curtis Sites Entered By: Curtis Sites on 01/15/2016 12:41:18 [name] ([id number]) -------------------------------------------------------------------------------- Wound Assessment Details Patient Name: [name] Date of Service: 01/15/2016 10:00 AM Medical Record Number: [id number] Patient Account Number: [id number] Date of Birth/Sex: 10-23-1920 (82 y.o. 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 Second Wound Open Wounding Event: Not Known Status: Date Acquired:  01/15/2016 Comorbid Arrhythmia, Deep Vein Thrombosis, Weeks Of Treatment: 0 History: Peripheral Arterial Disease, Type II Clustered Wound: No Diabetes Photos Photo Uploaded By: Curtis Sites on 01/15/2016 16:04:35 Wound Measurements Length: (cm) 0.7 Width: (cm) 0.8 Depth: (cm) 0.2 Area: (cm) 0.44 Volume: (cm) 0.088 % Reduction in Area: % Reduction in Volume: Epithelialization: None Tunneling: No Undermining: No Wound Description Full Thickness Without Classification: Exposed Support Structures Diabetic Severity Grade 1 (Wagner): Wound Margin: Flat and Intact Exudate Amount: Large Exudate Type: Serous Exudate Color: amber Foul Odor After Cleansing: Yes Due to Product Use: No Wound Bed Granulation Amount: None Present (0%) Exposed Structure Archuletta, Zaylee ([id number]) Necrotic Amount: Large (67-100%) Fascia Exposed: No Necrotic Quality: Eschar, Adherent Slough Fat Layer Exposed: No Tendon Exposed: No Muscle Exposed: No Joint Exposed: No Bone Exposed: No Limited to Skin Breakdown Periwound Skin Texture Texture Color No Abnormalities Noted: No No Abnormalities Noted: No Callus: No Atrophie Blanche: No Crepitus: No Cyanosis: No Excoriation: No Ecchymosis: No Fluctuance: No Erythema: Yes Friable: No Erythema Location: Circumferential Induration: No Hemosiderin Staining: No Localized Edema: Yes Mottled: No Rash: No Pallor: No Scarring: No Rubor: No Moisture Temperature / Pain No Abnormalities Noted: No Temperature: No Abnormality Dry / Scaly: No Tenderness on Palpation: Yes Maceration: Yes Moist: Yes Wound Preparation Ulcer Cleansing: Rinsed/Irrigated with Saline Topical Anesthetic Applied: Other: lidocaine 4%, Treatment Notes Wound #11 (Right Toe Second) 1. Cleansed with: Clean wound with Normal Saline 4. Dressing Applied: Aquacel Ag Other dressing (specify in notes) 5. Secondary Dressing Applied ABD and  Kerlix/Conform Notes xtrasorb Electronic Signature(s) Signed: 01/15/2016 5:12:38 PM By: Curtis Sites Entered By: Curtis Sites on 01/15/2016 10:20:24 [name] ([id number] [name], [name] ([id number]) -------------------------------------------------------------------------------- Wound Assessment Details Patient Name: [name] Date of Service: 01/15/2016 10:00 AM Medical Record Number: [id number] Patient Account Number: [id number] Date of Birth/Sex: December 17, 1920 (82 y.o. Female) Treating RN: Curtis Sites Primary Care Physician: Dorothey Baseman Other Clinician: Referring Physician: Dorothey Baseman Treating Physician/Extender: Rudene Re in Treatment: 48 Wound Status Wound Number: 6 Primary Arterial  Insufficiency Ulcer Etiology: Wound Location: Right Lower Leg - Posterior Wound Open Wounding Event: Blister Status: Date Acquired: 06/12/2015 Comorbid Arrhythmia, Deep Vein Thrombosis, Weeks Of Treatment: 30 History: Peripheral Arterial Disease, Type II Clustered Wound: No Diabetes Photos Wound Measurements Length: (cm) 12 Width: (cm) 15 Depth: (cm) 0.2 Area: (cm) 141.372 Volume: (cm) 28.274 % Reduction in Area: -17909.2% % Reduction in Volume: -35689.9% Epithelialization: None Tunneling: No Undermining: No Wound Description Full Thickness Without Foul Odor Aft Classification: Exposed Support Structures Diabetic Severity Grade 1 (Wagner): Wound Margin: Flat and Intact Exudate Amount: Large Exudate Type: Serous Exudate Color: amber er Cleansing: No Wound Bed Granulation Amount: None Present (0%) Exposed Structure Necrotic Amount: Large (67-100%) Fascia Exposed: No [name], Zeva ([id number]) Necrotic Quality: Adherent Slough Fat Layer Exposed: No Tendon Exposed: No Muscle Exposed: No Joint Exposed: No Bone Exposed: No Limited to Skin Breakdown Periwound Skin Texture Texture Color No Abnormalities Noted: No No Abnormalities  Noted: No Callus: No Atrophie Blanche: No Crepitus: No Cyanosis: No Excoriation: No Ecchymosis: No Fluctuance: No Erythema: No Friable: No Hemosiderin Staining: No Induration: No Mottled: No Localized Edema: Yes Pallor: No Rash: No Rubor: No Scarring: No Temperature / Pain Moisture Temperature: No Abnormality No Abnormalities Noted: No Tenderness on Palpation: Yes Dry / Scaly: No Maceration: No Moist: Yes Wound Preparation Ulcer Cleansing: Rinsed/Irrigated with Saline Topical Anesthetic Applied: Other: lidocaine 4%, Treatment Notes Wound #6 (Right, Posterior Lower Leg) 1. Cleansed with: Clean wound with Normal Saline 4. Dressing Applied: Aquacel Ag Other dressing (specify in notes) 5. Secondary Dressing Applied ABD and Kerlix/Conform Notes xtrasorb Electronic Signature(s) Signed: 01/15/2016 4:07:06 PM By: Curtis Sitesorthy, Joanna Entered By: Curtis Sitesorthy, Joanna on 01/15/2016 16:07:06 [name] NationsSILETZKY, [name] ([id number]) -------------------------------------------------------------------------------- Vitals Details Patient Name: [name] NationsSILETZKY, [name] Date of Service: 01/15/2016 10:00 AM Medical Record Number: [id number] Patient Account Number: [id number] Date of Birth/Sex: 10/06/1920 (82 y.o. Female) Treating RN: Curtis Sitesorthy, Joanna Primary Care Physician: Dorothey BasemanBRONSTEIN, DAVID Other Clinician: Referring Physician: Terance HartBRONSTEIN, DAVID Treating Physician/Extender: Rudene ReBritto, Errol Weeks in Treatment: 48 Vital Signs Time Taken: 10:19 Pulse (bpm): 44 Height (in): 62 Respiratory Rate (breaths/min): 16 Weight (lbs): 155 Blood Pressure (mmHg): 131/48 Body Mass Index (BMI): 28.3 Reference Range: 80 - 120 mg / dl Electronic Signature(s) Signed: 01/15/2016 5:12:38 PM By: Curtis Sitesorthy, Joanna Entered By: Curtis Sitesorthy, Joanna on 01/15/2016 10:19:37

## 2016-01-22 ENCOUNTER — Other Ambulatory Visit: Payer: Self-pay | Admitting: Surgery

## 2016-01-22 ENCOUNTER — Ambulatory Visit
Admission: RE | Admit: 2016-01-22 | Discharge: 2016-01-22 | Disposition: A | Discharge status: Home / Self Care | Payer: Medicare Other | Source: Ambulatory Visit | Attending: Surgery | Admitting: Surgery

## 2016-01-22 ENCOUNTER — Encounter: Payer: Medicare Other | Admitting: Surgery

## 2016-01-22 DIAGNOSIS — X58XXXA Exposure to other specified factors, initial encounter: Secondary | ICD-10-CM | POA: Insufficient documentation

## 2016-01-22 DIAGNOSIS — S81809A Unspecified open wound, unspecified lower leg, initial encounter: Secondary | ICD-10-CM

## 2016-01-22 DIAGNOSIS — S91109A Unspecified open wound of unspecified toe(s) without damage to nail, initial encounter: Secondary | ICD-10-CM | POA: Diagnosis present

## 2016-01-22 DIAGNOSIS — R938 Abnormal findings on diagnostic imaging of other specified body structures: Secondary | ICD-10-CM | POA: Diagnosis not present

## 2016-01-22 DIAGNOSIS — E11621 Type 2 diabetes mellitus with foot ulcer: Secondary | ICD-10-CM | POA: Diagnosis not present

## 2016-01-22 NOTE — Progress Notes (Addendum)
JAMEE, KEACH ([id number]) Visit Report for 01/22/2016 Chief Complaint Document Details Patient Name: [name], [name] Date of Service: 01/22/2016 2:15 PM Medical Record Number: [id number] Patient Account Number: [id number] Date of Birth/Sex: 1921/09/12 (82 y.o. Female) Treating RN: Curtis Sites Primary Care Physician: Dorothey Baseman Other Clinician: Referring Physician: Dorothey Baseman Treating Physician/Extender: Rudene Re in Treatment: 73 Information Obtained from: Patient Chief Complaint Patient presents to the wound care center for a consult due non healing wound. 82 year old patient with comes with a history of a ulcerated area to the right third toe and the right heel which she's had for about 2 months. Electronic Signature(s) Signed: 01/22/2016 3:05:45 PM By: Evlyn Kanner MD, FACS Entered By: Evlyn Kanner on 01/22/2016 15:05:44 [name] ([id number]) -------------------------------------------------------------------------------- HPI Details Patient Name: [name] Date of Service: 01/22/2016 2:15 PM Medical Record Number: [id number] Patient Account Number: [id number] Date of Birth/Sex: 1920/12/23 (82 y.o. Female) Treating RN: Curtis Sites Primary Care Physician: Dorothey Baseman Other Clinician: Referring Physician: Dorothey Baseman Treating Physician/Extender: Rudene Re in Treatment: 19 History of Present Illness Location: right medial heel and right third toe Quality: Patient reports experiencing a dull pain to affected area(s). Severity: Patient states wound are getting worse. Duration: Patient has had the wound for > 3 months prior to seeking treatment at the wound center Timing: Pain in wound is Intermittent (comes and goes Context: The wound appeared gradually over time Modifying Factors: Consults to this date include:vascular surgeon and several recent surgeries. Associated Signs and Symptoms: Patient reports having foul odor and  drainage from the left below-knee amputation site. HPI Description: A pleasant 82 year old patient who is known to have diabetes mellitus for several years has recently gone through a series of operations with the vascular surgeon Dr. Gilda Crease. In January and February she had several surgeries on the left lower extremity with an attempt to have limb salvage for gangrenous changes of her forefoot. 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 On 01/07/2015 she also had a right lower extremity distal runoff with a angioplasty of the right anterior tibial artery to maximize her blood flow to the foot. She recently had her staples removed at Dr. Marijean Heath office on 01/31/2015 and at that time a right lower extremity duplex was done which showed patent vessels and a patent stent with distal occluded posterior tibial artery. Though the duplex was noncritical the recommendations from the PA at the vascular surgery office was that of a angiogram to be done but the patient said she would rather try some bone care before. The patient has received doxycycline and Cipro in the recent past and she takes oral medications for her diabetes. Other than that I reviewed her list of all her medications. No recent hemoglobin A1c has been done and no recent x-rays of the right foot have been done. 02/18/2015 -- x-ray of the right foot was done on 02/11/2015 and it shows no evidence of acute osteomyelitis of the third toe or of the calcaneus. She had gone to the vascular surgery office and they had noted that there is dehiscence of the left part of the amputation site of the below-knee wound and they have asked [place] to kindly take over the care of this. There've been doing dressings for the right heel and right third toe. 02/25/2015 -- we have received notes from the vascular group who saw her last on 02/13/2015 and she was seen by the PA Ms.  Cleda DaubKimberly Stegmayer. She had recommended that the patient continue to follow with us for wound care including management of the left BKA stump, which has had dehiscence of the lateral part. They will consider repeating a arterial duplex or angiogram if the right lower extremity does not heal within a reasonable period of time. 03/18/2015 -- he saw the vascular surgeon Dr. Gilda CreaseSchnier and he has set her up for an angioplasty sometime in the middle of July. she is doing well otherwise. [name] NationsSILETZKY, [name] ([id number]) 04/10/2015 -- the patient had a procedure done on 04/08/2015 and this was a right angioplasty of the dorsalis pedis, anterior tibial artery, first superficial femoral artery in its midportion. There was successful intervention with recanalization and in-line flow to the right foot. 04/22/2015 -- the patient was looking rather pale today and the daughter confirms that her hemoglobin was down to 6.9. she is being monitored by her PCP. Her Lasix dose was increased and her edema has gone down significantly. 04/29/2015 -- she had vascular studies done and the ABI on the right is 1.3 and the toe pressures were within normal limits. Her hemoglobin is still around 7 and she refuses to take any blood transfusions for religious reasons. Her potassium was normal. 06/10/2015 -- she has a new blister on her right lateral and posterior part of her leg just in the region where the Kerlix bandages were applied and this may be due to an abrasion. 06/24/2015 -- On her right lower extremity she has developed several more blisters which look like small pustules and the drain informed shallow ulcerations. 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 07/01/2015 -- she has an appointment with the PCP this Friday and her vascular surgeon on Monday. Other than that she is on doxycycline and has finished 1 week of treatment. The pustules she had all over have now resolved. 07/08/2015 -- she saw the vascular surgeon who did  studies in the office and found that there is poor circulation in the distal lower right leg and has set her up for an angiogram and possible stenting next Tuesday. 07/22/2015 -- on October 18 she was taken up for a successful revascularization of the anterior tibial vessel on the right side. a PTA and stent placement was done to the right anterior tibial artery. 08/19/2015 -- she has broken out with some linear ulceration in the region of her webspaces of her toes and this is something new. 08/26/2015 -- over the last 3 days there was a streak of redness going from her lower extremity towards her toes but she had no fever or discharge from the wounds. 09/02/2015 -- the redness and cellulitis has gone down but she continues to have a lot of edema of the right lower extremity. 10/28/2015 -- over the last week she has been draining a lot of fluid from her right lower extremities and several of the wounds which had healed in the past and now opened up again 11/06/2015 -- she was seen in the vascular office and the right ABI was more than 1.3 which was consistent with noncompressible arteries due to medial calcification and the right great toe pressure was 0.24 and the PPG waveform showed significant decrease. from talking to the family member I believe no procedure has been planned. She was also seen by her PCP was increased her Lasix to 80 mg a day and has ordered some lab work but these reports are not back. 11/20/2015 -- Recent labs done hemoglobin A1c was 6.2, INR was .93,  iron-binding capacity was 310, BMP was within normal limits except for the BUN which was 40 and albumin was 3.1. Her hemoglobin is up to 10.7 with a hematocrit of 34.2 he was advised lymphedema pumps by Dr. Gilda Crease and she has not been wearing these. Her daughter-in- law also says that she has been very noncompliant about elevation of her limbs. 12/18/2015 -- she was seen by Dr. Gilda Crease who agreed with the management and  did not think she required any intervention. He discussed elevation and use of lymph pump and will see her back as required. 01/15/2016 -- we have been seeing help me every other week and from what I understand from the family [name], [name] ([id number]) member she is quite stubborn and noncompliant and does not use any elevation of follow the advice saying most medications and applications are causing her pain. Electronic Signature(s) Signed: 01/22/2016 3:05:49 PM By: Evlyn Kanner MD, FACS Entered By: Evlyn Kanner on 01/22/2016 15:05:49 [name] ([id number]) -------------------------------------------------------------------------------- Physical Exam Details Patient Name: [name] Date of Service: 01/22/2016 2:15 PM Medical Record Number: [id number] Patient Account Number: [id number] Date of Birth/Sex: December 05, 1920 (82 y.o. Female) Treating RN: Curtis Sites Primary Care Physician: Dorothey Baseman Other Clinician: Referring Physician: Dorothey Baseman Treating Physician/Extender: Rudene Re in Treatment: 49 Constitutional . Pulse regular. Respirations normal and unlabored. Afebrile. . Eyes Nonicteric. Reactive to light. Ears, Nose, Mouth, and Throat Lips, teeth, and gums WNL.Marland Kitchen Moist mucosa without lesions. Neck supple and nontender. No palpable supraclavicular or cervical adenopathy. Normal sized without goiter. Respiratory WNL. No retractions.. Cardiovascular Pedal Pulses WNL. No clubbing, cyanosis or edema. Lymphatic No adneopathy. No adenopathy. No adenopathy. Musculoskeletal Adexa without tenderness or enlargement.. Digits and nails w/o clubbing, cyanosis, infection, petechiae, ischemia, or inflammatory conditions.. Integumentary (Hair, Skin) No suspicious lesions. No crepitus or fluctuance. No peri-wound warmth or erythema. No masses.Marland Kitchen Psychiatric Judgement and insight Intact.. No evidence of depression, anxiety, or agitation.. Notes the  lymphedema has gone down and the weeping is much better today and the posterior part of the lower third of her leg continues to have a lot of debris which is too tender to remove with a curette. The antifungal ointment has helped control the florid fungal infection and the ulcerated area on her right second toe on the dorsum is much worse and I would recommend an x-ray to rule out osteomyelitis. Electronic Signature(s) Signed: 01/22/2016 3:07:01 PM By: Evlyn Kanner MD, FACS Entered By: Evlyn Kanner on 01/22/2016 15:07:00 [name] ([id number]) -------------------------------------------------------------------------------- Physician Orders Details Patient Name: [name] Date of Service: 01/22/2016 2:15 PM Medical Record Number: [id number] Patient Account Number: [id number] Date of Birth/Sex: 05/02/1921 (82 y.o. Female) Treating RN: Curtis Sites Primary Care Physician: Dorothey Baseman Other Clinician: Referring Physician: Dorothey Baseman Treating Physician/Extender: Rudene Re in Treatment: 50 Verbal / Phone Orders: Yes Clinician: Curtis Sites Read Back and Verified: Yes Diagnosis Coding Wound Cleansing Wound #11 Right Toe Second o Cleanse wound with mild soap and water o May Shower, gently pat wound dry prior to applying new dressing. Wound #6 Right,Posterior Lower Leg o Cleanse wound with mild soap and water o May Shower, gently pat wound dry prior to applying new dressing. Anesthetic Wound #11 Right Toe Second o Topical Lidocaine 4% cream applied to wound bed prior to debridement Wound #6 Right,Posterior Lower Leg o Topical Lidocaine 4% cream applied to wound bed prior to debridement Primary Wound Dressing Wound #11 Right Toe Second o Aquacel Ag o Other: - lotrisone cream Wound #6 Right,Posterior  Lower Leg o Medihoney hydrocolloid o Other: - lotrisone cream Secondary Dressing Wound #11 Right Toe Second o Gauze, ABD and  Kerlix/Conform o XtraSorb - where needed Wound #6 Right,Posterior Lower Leg o Gauze, ABD and Kerlix/Conform o XtraSorb - where needed Dressing Change Frequency Wound #11 Right Toe Second Perona, Dianah ([id number]) o Change dressing every day. Wound #6 Right,Posterior Lower Leg o Change dressing every day. Follow-up Appointments Wound #11 Right Toe Second o Return Appointment in 1 week. Wound #6 Right,Posterior Lower Leg o Return Appointment in 1 week. Home Health Wound #11 Right Toe Second o Continue Home Health Visits - [facility] please provide appropriate supplies for patient o Home Health Nurse may visit PRN to address patientos wound care needs. o FACE TO FACE ENCOUNTER: MEDICARE and MEDICAID PATIENTS: I certify that this patient is under my care and that I had a face-to-face encounter that meets the physician face-to-face encounter requirements with this patient on this date. The encounter with the patient was in whole or in part for the following MEDICAL CONDITION: (primary reason for Home Healthcare) MEDICAL NECESSITY: I certify, that based on my findings, NURSING services are a medically necessary home health service. HOME BOUND STATUS: I certify that my clinical findings support that this patient is homebound (i.e., Due to illness or injury, pt requires aid of supportive devices such as crutches, cane, wheelchairs, walkers, the use of special transportation or the assistance of another person to leave their place of residence. There is a normal inability to leave the home and doing so requires considerable and taxing effort. Other absences are for medical reasons / religious services and are infrequent or of short duration when for other reasons). o If current dressing causes regression in wound condition, may D/C ordered dressing product/s and apply Normal Saline Moist Dressing daily until next Wound Healing Center / Other MD appointment. Notify Wound  Healing Center of regression in wound condition at [phone]. o Please direct any NON-WOUND related issues/requests for orders to patient's Primary Care Physician Wound #6 Right,Posterior Lower Leg o Continue Home Health Visits - [facility] please provide appropriate supplies for patient o Home Health Nurse may visit PRN to address patientos wound care needs. o FACE TO FACE ENCOUNTER: MEDICARE and MEDICAID PATIENTS: I certify that this patient is under my care and that I had a face-to-face encounter that meets the physician face-to-face encounter requirements with this patient on this date. The encounter with the patient was in whole or in part for the following MEDICAL CONDITION: (primary reason for Home Healthcare) MEDICAL NECESSITY: I certify, that based on my findings, NURSING services are a medically necessary home health service. HOME BOUND STATUS: I certify that my clinical findings support that this patient is homebound (i.e., Due to illness or injury, pt requires aid of supportive devices such as crutches, cane, wheelchairs, walkers, the use of special transportation or the assistance of another person to leave their place of residence. There is a normal inability to leave the home and doing so requires considerable and taxing effort. Other absences are for medical reasons / religious services and are infrequent or of short duration when for other reasons). MORMINO, Blinda ([id number]) o If current dressing causes regression in wound condition, may D/C ordered dressing product/s and apply Normal Saline Moist Dressing daily until next Wound Healing Center / Other MD appointment. Notify Wound Healing Center of regression in wound condition at [phone]. o Please direct any NON-WOUND related issues/requests for orders to patient's Primary Care Physician Radiology   o X-ray, foot - right Electronic Signature(s) Signed: 01/22/2016 4:28:22 PM By: Evlyn Kanner MD,  FACS Signed: 01/22/2016 6:41:05 PM By: Curtis Sites Entered By: Curtis Sites on 01/22/2016 15:24:30 [name] ([id number]) -------------------------------------------------------------------------------- Problem List Details Patient Name: [name] Date of Service: 01/22/2016 2:15 PM Medical Record Number: [id number] Patient Account Number: [id number] Date of Birth/Sex: 1920-10-01 (82 y.o. 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 with 02/11/2015 Yes fat layer exposed L97.812 Non-pressure chronic ulcer of other part of right lower leg 02/11/2015 Yes with fat layer exposed T81.31XA Disruption of external operation (surgical) wound, not 02/18/2015 Yes elsewhere classified, initial encounter Inactive Problems Resolved Problems Electronic Signature(s) Signed: 01/22/2016 3:05:38 PM By: Evlyn Kanner MD, FACS Entered By: Evlyn Kanner on 01/22/2016 15:05:37 [name] ([id number]) -------------------------------------------------------------------------------- Progress Note Details Patient Name: [name] Date of Service: 01/22/2016 2:15 PM Medical Record Number: [id number] Patient Account Number: [id number] Date of Birth/Sex: 1921-04-11 (82 y.o. Female) Treating RN: Curtis Sites Primary Care Physician: Dorothey Baseman Other Clinician: Referring Physician: Dorothey Baseman Treating Physician/Extender: Rudene Re in Treatment:  75 Subjective Chief Complaint Information obtained from Patient Patient presents to the wound care center for a consult due non healing wound. 82 year old patient with comes with a history of a ulcerated area to the right third toe and the right heel which she's had for about 2 months. History of Present Illness (HPI) The following HPI elements were documented for the patient's wound: Location: right medial heel and right third toe Quality: Patient reports experiencing a dull pain to affected area(s). Severity: Patient states wound are getting worse. Duration: Patient has had the wound for > 3 months prior to seeking treatment at the wound center Timing: Pain in wound is Intermittent (comes and goes Context: The wound appeared gradually over time Modifying Factors: Consults to this date include:vascular surgeon and several recent surgeries. Associated Signs and Symptoms: Patient reports having foul odor and drainage from the left below-knee amputation site. A pleasant 82 year old patient who is known to have diabetes mellitus for several years has recently gone through a series of operations with the vascular surgeon Dr. Gilda Crease. In January and February she had several surgeries on the left lower extremity with an attempt to have limb salvage for gangrenous changes of her forefoot. 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 On 01/07/2015 she also had a right lower extremity distal runoff with a angioplasty of the right anterior tibial artery to maximize her blood flow to the foot. She recently had her staples removed at Dr. Marijean Heath office on 01/31/2015 and at that time a right lower extremity duplex was done which showed patent vessels and a patent stent with distal occluded posterior tibial artery. Though the duplex was noncritical the recommendations from the PA at the vascular surgery office was that of a  angiogram to be done but the patient said she would rather try some bone care before. The patient has received doxycycline and Cipro in the recent past and she takes oral medications for her  diabetes. Other than that I reviewed her list of all her medications. No recent hemoglobin A1c has been done and no recent x-rays of the right foot have been done. 02/18/2015 -- x-ray of the right foot was done on 02/11/2015 and it shows no evidence of acute osteomyelitis of the third toe or of the calcaneus. She had gone to the vascular surgery office and they had noted that there is dehiscence of the left part of the amputation site of the below-knee wound and they have asked [place] to kindly take over the care of this. There've been doing dressings for the right heel and right third toe. [name], [name] ([id number]) 02/25/2015 -- we have received notes from the vascular group who saw her last on 02/13/2015 and she was seen by the PA Ms. Cleda Daub. She had recommended that the patient continue to follow with [place] for wound care including management of the left BKA stump, which has had dehiscence of the lateral part. They will consider repeating a arterial duplex or angiogram if the right lower extremity does not heal within a reasonable period of time. 03/18/2015 -- he saw the vascular surgeon Dr. Gilda Crease and he has set her up for an angioplasty sometime in the middle of July. she is doing well otherwise. 04/10/2015 -- the patient had a procedure done on 04/08/2015 and this was a right angioplasty of the dorsalis pedis, anterior tibial artery, first superficial femoral artery in its midportion. There was successful intervention with recanalization and in-line flow to the right foot. 04/22/2015 -- the patient was looking rather pale today and the daughter confirms that her hemoglobin was down to 6.9. she is being monitored by her PCP. Her Lasix dose was increased and her edema has gone down  significantly. 04/29/2015 -- she had vascular studies done and the ABI on the right is 1.3 and the toe pressures were within normal limits. Her hemoglobin is still around 7 and she refuses to take any blood transfusions for religious reasons. Her potassium was normal. 06/10/2015 -- she has a new blister on her right lateral and posterior part of her leg just in the region where the Kerlix bandages were applied and this may be due to an abrasion. 06/24/2015 -- On her right lower extremity she has developed several more blisters which look like small pustules and the drain informed shallow ulcerations. 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 07/01/2015 -- she has an appointment with the PCP this Friday and her vascular surgeon on Monday. Other than that she is on doxycycline and has finished 1 week of treatment. The pustules she had all over have now resolved. 07/08/2015 -- she saw the vascular surgeon who did studies in the office and found that there is poor circulation in the distal lower right leg and has set her up for an angiogram and possible stenting next Tuesday. 07/22/2015 -- on October 18 she was taken up for a successful revascularization of the anterior tibial vessel on the right side. a PTA and stent placement was done to the right anterior tibial artery. 08/19/2015 -- she has broken out with some linear ulceration in the region of her webspaces of her toes and this is something new. 08/26/2015 -- over the last 3 days there was a streak of redness going from her lower extremity towards her toes but she had no fever or discharge from the wounds. 09/02/2015 -- the redness and cellulitis has gone down but she continues to have a lot of edema of  the right lower extremity. 10/28/2015 -- over the last week she has been draining a lot of fluid from her right lower extremities and several of the wounds which had healed in the past and now opened up again 11/06/2015 -- she was seen in the  vascular office and the right ABI was more than 1.3 which was consistent with noncompressible arteries due to medial calcification and the right great toe pressure was 0.24 and the PPG waveform showed significant decrease. from talking to the family member I believe no procedure has been planned. She was also seen by her PCP was increased her Lasix to 80 mg a day and has ordered some lab work but these reports are not back. [name], [name] ([id number]) 11/20/2015 -- Recent labs done hemoglobin A1c was 6.2, INR was .93, iron-binding capacity was 310, BMP was within normal limits except for the BUN which was 40 and albumin was 3.1. Her hemoglobin is up to 10.7 with a hematocrit of 34.2 he was advised lymphedema pumps by Dr. Gilda Crease and she has not been wearing these. Her daughter-in- law also says that she has been very noncompliant about elevation of her limbs. 12/18/2015 -- she was seen by Dr. Gilda Crease who agreed with the management and did not think she required any intervention. He discussed elevation and use of lymph pump and will see her back as required. 01/15/2016 -- we have been seeing help me every other week and from what I understand from the family member she is quite stubborn and noncompliant and does not use any elevation of follow the advice saying most medications and applications are causing her pain. Objective Constitutional Pulse regular. Respirations normal and unlabored. Afebrile. Vitals Time Taken: 2:19 PM, Height: 62 in, Weight: 155 lbs, BMI: 28.3, Pulse: 60 bpm, Respiratory Rate: 16 breaths/min, Blood Pressure: 111/57 mmHg. Eyes Nonicteric. Reactive to light. Ears, Nose, Mouth, and Throat Lips, teeth, and gums WNL.Marland Kitchen Moist mucosa without lesions. Neck supple and nontender. No palpable supraclavicular or cervical adenopathy. Normal sized without goiter. Respiratory WNL. No retractions.. Cardiovascular Pedal Pulses WNL. No clubbing, cyanosis or edema. Lymphatic No  adneopathy. No adenopathy. No adenopathy. Musculoskeletal Adexa without tenderness or enlargement.. Digits and nails w/o clubbing, cyanosis, infection, petechiae, ischemia, or inflammatory conditions.Marland Kitchen Psychiatric [name], [name] ([id number]) Judgement and insight Intact.. No evidence of depression, anxiety, or agitation.. General Notes: the lymphedema has gone down and the weeping is much better today and the posterior part of the lower third of her leg continues to have a lot of debris which is too tender to remove with a curette. The antifungal ointment has helped control the florid fungal infection and the ulcerated area on her right second toe on the dorsum is much worse and I would recommend an x-ray to rule out osteomyelitis. Integumentary (Hair, Skin) No suspicious lesions. No crepitus or fluctuance. No peri-wound warmth or erythema. No masses.. Wound #11 status is Open. Original cause of wound was Not Known. The wound is located on the Right Toe Second. The wound measures 0.7cm length x 0.8cm width x 0.2cm depth; 0.44cm^2 area and 0.088cm^3 volume. The wound is limited to skin breakdown. There is no tunneling or undermining noted. There is a large amount of serous drainage noted. The wound margin is flat and intact. There is no granulation within the wound bed. There is a large (67-100%) amount of necrotic tissue within the wound bed including Eschar and Adherent Slough. The periwound skin appearance exhibited: Localized Edema, Maceration, Moist, Erythema. The periwound skin  appearance did not exhibit: Callus, Crepitus, Excoriation, Fluctuance, Friable, Induration, Rash, Scarring, Dry/Scaly, Atrophie Blanche, Cyanosis, Ecchymosis, Hemosiderin Staining, Mottled, Pallor, Rubor. The surrounding wound skin color is noted with erythema which is circumferential. Periwound temperature was noted as No Abnormality. The periwound has tenderness on palpation. Wound #6 status is Open. Original  cause of wound was Blister. The wound is located on the Right,Posterior Lower Leg. The wound measures 10cm length x 11.5cm width x 0.2cm depth; 90.321cm^2 area and 18.064cm^3 volume. The wound is limited to skin breakdown. There is no tunneling or undermining noted. There is a large amount of serous drainage noted. The wound margin is flat and intact. There is no granulation within the wound bed. There is a large (67-100%) amount of necrotic tissue within the wound bed including Adherent Slough. The periwound skin appearance exhibited: Localized Edema, Moist. The periwound skin appearance did not exhibit: Callus, Crepitus, Excoriation, Fluctuance, Friable, Induration, Rash, Scarring, Dry/Scaly, Maceration, Atrophie Blanche, Cyanosis, Ecchymosis, Hemosiderin Staining, Mottled, Pallor, Rubor, Erythema. Periwound temperature was noted as No Abnormality. The periwound has tenderness on palpation. 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 Wound #6 Right,Posterior Lower Leg: Cleanse wound with mild soap and water May Shower, gently pat wound dry prior to applying new dressing. Anesthetic: Wound #11 Right Toe Second: Topical Lidocaine 4% cream applied to wound bed prior to debridement Wound #6 Right,Posterior Lower Leg: Topical Lidocaine 4% cream  applied to wound bed prior to debridement Primary Wound Dressing: Wound #11 Right Toe Second: Aquacel Ag Other: - lotrisone cream Wound #6 Right,Posterior Lower Leg: Medihoney hydrocolloid Other: - lotrisone cream Secondary Dressing: Wound #11 Right Toe Second: Gauze, ABD and Kerlix/Conform XtraSorb - where needed Wound #6 Right,Posterior Lower Leg: Gauze, ABD and Kerlix/Conform XtraSorb - where needed Dressing Change Frequency: Wound #11 Right Toe Second: Change dressing every day. Wound #6 Right,Posterior Lower Leg: Change dressing every day. Follow-up Appointments: Wound #11 Right Toe Second: Return Appointment in 1 week. Wound #6 Right,Posterior Lower Leg: Return Appointment in 1 week. Home Health: Wound #11 Right Toe Second: Continue Home Health Visits - [facility] please provide appropriate supplies for patient Home Health Nurse may visit PRN to address patient s wound care needs. FACE TO FACE ENCOUNTER: MEDICARE and MEDICAID PATIENTS: I certify that this patient is under my care and that I had a face-to-face encounter that meets the physician face-to-face encounter requirements with this patient on this date. The encounter with the patient was in whole or in part for the following MEDICAL CONDITION: (primary reason for Home Healthcare) MEDICAL NECESSITY: I certify, that based on my findings, NURSING services are a medically necessary home health service. HOME [name], [name] ([id number]) BOUND STATUS: I certify that my clinical findings support that this patient is homebound (i.e., Due to illness or injury, pt requires aid of supportive devices such as crutches, cane, wheelchairs, walkers, the use of special transportation or the  assistance of another person to leave their place of residence. There is a normal inability to leave the home and doing so requires considerable and taxing effort. Other absences are for medical reasons / religious services and are infrequent or of  short duration when for other reasons). If current dressing causes regression in wound condition, may D/C ordered dressing product/s and apply Normal Saline Moist Dressing daily until next Wound Healing Center / Other MD appointment. Notify Wound Healing Center of regression in wound condition at [phone]. Please direct any NON-WOUND related issues/requests for orders to patient's Primary Care Physician Wound #6 Right,Posterior Lower Leg: Continue Home Health Visits - [facility] please provide appropriate supplies for patient Home Health Nurse may visit PRN to address patient s wound care needs. FACE TO FACE ENCOUNTER: MEDICARE and MEDICAID PATIENTS: I certify that this patient is under my care and that I had a face-to-face encounter that meets the physician face-to-face encounter requirements with this patient on this date. The encounter with the patient was in whole or in part for the following MEDICAL CONDITION: (primary reason for Home Healthcare) MEDICAL NECESSITY: I certify, that based on my findings, NURSING services are a medically necessary home health service. HOME BOUND STATUS: I certify that my clinical findings support that this patient is homebound (i.e., Due to illness or injury, pt requires aid of supportive devices such as crutches, cane, wheelchairs, walkers, the use of special transportation or the assistance of another person to leave their place of residence. There is a normal inability to leave the home and doing so requires considerable and taxing effort. Other absences are for medical reasons / religious services and are infrequent or of short duration when for other reasons). If current dressing causes regression in wound condition, may D/C ordered dressing product/s and apply Normal Saline Moist Dressing daily until next Wound Healing Center / Other MD appointment. Notify Wound Healing Center of regression in wound condition at [phone]. Please direct any NON-WOUND  related issues/requests for orders to patient's Primary Care Physician Radiology ordered were: X-ray, foot - right I have recommended an x-ray of the right foot with concentration on the right second toe to look for osteomyelitis. I have also recommended Lotrisone ointment to be applied between her toes and on the dorsum of forefoot and the lower leg. For the right lower extremity we will use Medihoney pads all she does not like this Aquacel Ag will be fine to be applied and changed on alternate days. Her daughter-in-law who was her caregiver tells me that she was able to use her lymphedema pumps for about 5 minutes and then pain was too much but we will keep trying to increase the timeframe Electronic Signature(s) Signed: 01/23/2016 12:41:00 PM By: Evlyn Kanner MD, FACS Previous Signature: 01/23/2016 12:40:44 PM Version By: Evlyn Kanner MD, FACS Previous Signature: 01/22/2016 3:08:30 PM Version By: Evlyn Kanner MD, FACS Entered By: Evlyn Kanner on 01/23/2016 12:41:00 [name] ([id number]) [name], [name] ([id number]) -------------------------------------------------------------------------------- SuperBill Details Patient Name: [name] Date of Service: 01/22/2016 Medical Record Number: [id number] Patient Account Number: [id number] Date of Birth/Sex: October 24, 1920 (82 y.o. Female) Treating RN: Curtis Sites Primary Care Physician: Dorothey Baseman Other Clinician: Referring Physician: Dorothey Baseman Treating Physician/Extender: Rudene Re in Treatment: 49 Diagnosis Coding ICD-10 Codes Code Description E11.621 Type 2 diabetes mellitus with foot ulcer I70.235 Atherosclerosis of native arteries of right leg with ulceration of other part of foot Z89.512 Acquired absence of left leg below knee L97.412 Non-pressure chronic ulcer of right  heel and midfoot with fat layer exposed L97.812 Non-pressure chronic ulcer of other part of right lower leg with fat layer  exposed Disruption of external operation (surgical) wound, not elsewhere classified, initial T81.31XA encounter Facility Procedures CPT4 Code: [id number] Description: [id number] - WOUND CARE VISIT-LEV 2 EST PT Modifier: Quantity: 1 Physician Procedures CPT4: Description Modifier Quantity Code [id number] - WC PHYS LEVEL 3 - EST PT 1 ICD-10 Description Diagnosis E11.621 Type 2 diabetes mellitus with foot ulcer I70.235 Atherosclerosis of native arteries of right leg with ulceration of other part of  foot L97.812 Non-pressure chronic ulcer of other part of right lower leg with fat layer exposed Electronic Signature(s) Signed: 01/22/2016 6:10:51 PM By: Curtis Sitesorthy, Joanna Signed: 01/23/2016 12:43:21 PM By: Evlyn KannerBritto, Rashmi Tallent MD, FACS Previous Signature: 01/22/2016 3:08:53 PM Version By: Evlyn KannerBritto, Dail Lerew MD, FACS Entered By: Curtis Sitesorthy, Joanna on 01/22/2016 18:10:51

## 2016-01-23 NOTE — Progress Notes (Addendum)
[name], [name] ([id number]) Visit Report for 01/22/2016 Arrival Information Details Patient Name: [name], [name] Date of Service: 01/22/2016 2:15 PM Medical Record Number: [id number] Patient Account Number: [id number] Date of Birth/Sex: 10-Sep-1921 (82 y.o. Female) Treating RN: Curtis Sites Primary Care Physician: Dorothey Baseman Other Clinician: Referring Physician: Dorothey Baseman Treating Physician/Extender: Rudene Re in Treatment: 14 Visit Information History Since Last Visit Added or deleted any medications: No Patient Arrived: Wheel Chair Any new allergies or adverse reactions: No Arrival Time: 14:17 Had a fall or experienced change in No activities of daily living that may affect Accompanied By: dtr risk of falls: Transfer Assistance: Manual Signs or symptoms of abuse/neglect since last No Patient Identification Verified: Yes visito Secondary Verification Process Yes Hospitalized since last visit: No Completed: Pain Present Now: Yes Patient Has Alerts: Yes Electronic Signature(s) Signed: 01/22/2016 6:41:05 PM By: Curtis Sites Entered By: Curtis Sites on 01/22/2016 14:17:53 [name] ([id number]) -------------------------------------------------------------------------------- Clinic Level of Care Assessment Details Patient Name: [name] Date of Service: 01/22/2016 2:15 PM Medical Record Number: [id number] Patient Account Number: [id number] Date of Birth/Sex: [birth year] (82 y.o. Female) Treating RN: Curtis Sites Primary Care Physician: Terance Hart, DAVID Other Clinician: Referring Physician: Terance Hart, DAVID Treating Physician/Extender: Rudene Re in Treatment: 61 Clinic Level of Care Assessment Items TOOL 4 Quantity Score []  - Use when only an EandM is performed on FOLLOW-UP visit 0 ASSESSMENTS - Nursing Assessment / Reassessment X - Reassessment of Co-morbidities (includes updates in patient status) 1 10 X - Reassessment of  Adherence to Treatment Plan 1 5 ASSESSMENTS - Wound and Skin Assessment / Reassessment []  - Simple Wound Assessment / Reassessment - one wound 0 X - Complex Wound Assessment / Reassessment - multiple wounds 2 5 []  - Dermatologic / Skin Assessment (not related to wound area) 0 ASSESSMENTS - Focused Assessment []  - Circumferential Edema Measurements - multi extremities 0 []  - Nutritional Assessment / Counseling / Intervention 0 X - Lower Extremity Assessment (monofilament, tuning fork, pulses) 1 5 []  - Peripheral Arterial Disease Assessment (using hand held doppler) 0 ASSESSMENTS - Ostomy and/or Continence Assessment and Care []  - Incontinence Assessment and Management 0 []  - Ostomy Care Assessment and Management (repouching, etc.) 0 PROCESS - Coordination of Care X - Simple Patient / Family Education for ongoing care 1 15 []  - Complex (extensive) Patient / Family Education for ongoing care 0 []  - Staff obtains Chiropractor, Records, Test Results / Process Orders 0 []  - Staff telephones HHA, Nursing Homes / Clarify orders / etc 0 []  - Routine Transfer to another Facility (non-emergent condition) 0 Rhyne, Donald ([id number]) []  - Routine Hospital Admission (non-emergent condition) 0 []  - New Admissions / Manufacturing engineer / Ordering NPWT, Apligraf, etc. 0 []  - Emergency Hospital Admission (emergent condition) 0 X - Simple Discharge Coordination 1 10 []  - Complex (extensive) Discharge Coordination 0 PROCESS - Special Needs []  - Pediatric / Minor Patient Management 0 []  - Isolation Patient Management 0 []  - Hearing / Language / Visual special needs 0 []  - Assessment of Community assistance (transportation, D/C planning, etc.) 0 []  - Additional assistance / Altered mentation 0 []  - Support Surface(s) Assessment (bed, cushion, seat, etc.) 0 INTERVENTIONS - Wound Cleansing / Measurement []  - Simple Wound Cleansing - one wound 0 X - Complex Wound Cleansing - multiple wounds 2 5 X -  Wound Imaging (photographs - any number of wounds) 1 5 []  - Wound Tracing (instead of photographs) 0 []  - Simple Wound Measurement - one wound 0 X -  Complex Wound Measurement - multiple wounds 2 5 INTERVENTIONS - Wound Dressings []  - Small Wound Dressing one or multiple wounds 0 X - Medium Wound Dressing one or multiple wounds 2 15 []  - Large Wound Dressing one or multiple wounds 0 []  - Application of Medications - topical 0 []  - Application of Medications - injection 0 INTERVENTIONS - Miscellaneous []  - External ear exam 0 [name], [name] ([id number]) []  - Specimen Collection (cultures, biopsies, blood, body fluids, etc.) 0 []  - Specimen(s) / Culture(s) sent or taken to Lab for analysis 0 []  - Patient Transfer (multiple staff / Michiel SitesHoyer Lift / Similar devices) 0 []  - Simple Staple / Suture removal (25 or less) 0 []  - Complex Staple / Suture removal (26 or more) 0 []  - Hypo / Hyperglycemic Management (close monitor of Blood Glucose) 0 []  - Ankle / Brachial Index (ABI) - do not check if billed separately 0 X - Vital Signs 1 5 Has the patient been seen at the hospital within the last three years: Yes Total Score: 115 Level Of Care: New/Established - Level 3 Electronic Signature(s) Signed: 01/22/2016 6:10:41 PM By: Curtis Sitesorthy, Joanna Entered By: Curtis Sitesorthy, Joanna on 01/22/2016 18:10:41 [name] NationsSILETZKY, [name] ([id number]) -------------------------------------------------------------------------------- Encounter Discharge Information Details Patient Name: [name] NationsSILETZKY, [name] Date of Service: 01/22/2016 2:15 PM Medical Record Number: [id number] Patient Account Number: [id number] Date of Birth/Sex: 11/07/1920 (82 y.o. Female) Treating RN: Curtis Sitesorthy, Joanna Primary Care Physician: Dorothey BasemanBRONSTEIN, DAVID Other Clinician: Referring Physician: Dorothey BasemanBRONSTEIN, DAVID Treating Physician/Extender: Rudene ReBritto, Errol Weeks in Treatment: 3449 Encounter Discharge Information Items Discharge Pain Level: 0 Discharge Condition:  Stable Ambulatory Status: Wheelchair Discharge Destination: Home Transportation: Private Auto Accompanied By: dtr in law Schedule Follow-up Appointment: Yes Medication Reconciliation completed and provided to Patient/Care No Tremaine Fuhriman: Provided on Clinical Summary of Care: 01/22/2016 Form Type Recipient Paper Patient HS Electronic Signature(s) Signed: 01/22/2016 6:12:39 PM By: Curtis Sitesorthy, Joanna Previous Signature: 01/22/2016 3:25:42 PM Version By: Gwenlyn PerkingMoore, Shelia Entered By: Curtis Sitesorthy, Joanna on 01/22/2016 18:12:39 [name] NationsSILETZKY, [name] ([id number]) -------------------------------------------------------------------------------- Multi Wound Chart Details Patient Name: [name] NationsSILETZKY, [name] Date of Service: 01/22/2016 2:15 PM Medical Record Number: [id number] Patient Account Number: [id number] Date of Birth/Sex: 01/20/1921 (82 y.o. 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] Comorbid History: [11:Arrhythmia, Deep Vein Thrombosis, Peripheral Arterial Disease, Type II Arterial Disease, Type II Diabetes] [6:Arrhythmia, Deep Vein Thrombosis, Peripheral Diabetes] [N/A:N/A] Date Acquired: [11:01/15/2016] [6:06/12/2015] [N/A:N/A] Weeks of Treatment: [11:1] [6:31] [N/A:N/A] Wound Status: [11:Open] [6:Open] [N/A:N/A] Measurements L x W x D 0.7x0.8x0.2 [6:10x11.5x0.2] [N/A:N/A] (cm) Area (cm) : [11:0.44] [6:90.321]  [N/A:N/A] Volume (cm) : [11:0.088] [6:18.064] [N/A:N/A] % Reduction in Area: [11:0.00%] [6:-11405.90%] [N/A:N/A] % Reduction in Volume: 0.00% [6:-22765.80%] [N/A:N/A] Classification: [11:Full Thickness Without Exposed Support Structures] [6:Full Thickness Without Exposed Support Structures] [N/A:N/A] HBO Classification: [11:Grade 1] [6:Grade 1] [N/A:N/A] Exudate Amount: [11:Large] [6:Large] [N/A:N/A] Exudate Type: [11:Serous] [6:Serous] [N/A:N/A] Exudate Color: [11:amber] [6:amber] [N/A:N/A] Foul Odor After [11:Yes] [6:No] [N/A:N/A] Cleansing: Odor Anticipated Due to No [6:N/A] [N/A:N/A] Product Use: Wound Margin: [11:Flat and Intact] [6:Flat and Intact] [N/A:N/A] Granulation Amount: None Present (0%) None Present (0%) N/A Necrotic Amount: Large (67-100%) Large (67-100%) N/A Necrotic Tissue:  Eschar, Adherent [employer] N/A Exposed Structures: Fascia: No Fascia: No N/A Fat: No Fat: No Tendon: No Tendon: No Muscle: No Muscle: No Joint: No Joint: No Bone: No Bone: No Limited to Skin Limited to Skin Breakdown Breakdown Epithelialization: None Small (1-33%) N/A Periwound Skin Texture: Edema: Yes Edema: Yes N/A Excoriation: No Excoriation: No Induration: No Induration: No Callus: No Callus: No Crepitus: No Crepitus: No Fluctuance: No Fluctuance: No Friable: No Friable: No Rash: No Rash: No Scarring: No Scarring: No Periwound Skin Maceration: Yes Moist: Yes N/A Moisture: Moist: Yes Maceration: No Dry/Scaly: No Dry/Scaly: No Periwound Skin Color: Erythema: Yes Atrophie Blanche: No N/A Atrophie Blanche: No Cyanosis: No Cyanosis: No Ecchymosis: No Ecchymosis: No Erythema: No Hemosiderin Staining: No Hemosiderin Staining: No Mottled: No Mottled: No Pallor: No Pallor: No Rubor: No Rubor: No Erythema Location: Circumferential N/A N/A Temperature: No Abnormality No Abnormality N/A Tenderness on Yes Yes N/A Palpation: Wound  Preparation: Ulcer Cleansing: Ulcer Cleansing: N/A Rinsed/Irrigated with Rinsed/Irrigated with Saline Saline Topical Anesthetic Topical Anesthetic Applied: Other: lidocaine Applied: Other: lidocaine 4% 4% Treatment Notes Electronic Signature(s) Signed: 01/22/2016 6:41:05 PM By: Curtis Sites Entered By: Curtis Sites on 01/22/2016 15:04:29 [name] ([id number]) [name], [name] ([id number]) -------------------------------------------------------------------------------- Multi-Disciplinary Care Plan Details Patient Name: [name] Date of Service: 01/22/2016 2:15 PM Medical Record Number: [id number] Patient Account Number: [id number] Date of Birth/Sex: 02/23/1921 (82 y.o. Female) Treating RN: Curtis Sites Primary Care Physician: Dorothey Baseman Other Clinician: Referring Physician: Dorothey Baseman Treating Physician/Extender: Rudene Re in Treatment: 35 Active Inactive Electronic Signature(s) Signed: 02/19/2016 4:56:07 PM By: Elliot Gurney RN, BSN, Kim RN, BSN Signed: 05/25/2016 4:02:52 PM By: Curtis Sites Previous Signature: 01/22/2016 6:41:05 PM Version By: Curtis Sites Entered By: Elliot Gurney RN, BSN, Kim on 02/19/2016 12:42:51 [name] ([id number]) -------------------------------------------------------------------------------- Pain Assessment Details Patient Name: [name] Date of Service: 01/22/2016 2:15 PM Medical Record Number: [id number] Patient Account Number: [id number] Date of Birth/Sex: 1920-12-23 (82 y.o. Female) Treating RN: Curtis Sites Primary Care Physician: Dorothey Baseman Other Clinician: Referring Physician: Dorothey Baseman Treating Physician/Extender: Rudene Re in Treatment: 20 Active Problems Location of Pain Severity and Description of Pain Patient Has Paino Yes Site Locations Pain Location: Pain in Ulcers With Dressing Change: Yes Duration of the Pain. Constant / Intermittento Constant Pain Management and  Medication Current Pain Management: Electronic Signature(s) Signed: 01/22/2016 6:41:05 PM By: Curtis Sites Entered By: Curtis Sites on 01/22/2016 14:18:20 [name] ([id number]) -------------------------------------------------------------------------------- Patient/Caregiver Education Details Patient Name: [name] Date of Service: 01/22/2016 2:15 PM Medical Record Number: [id number] Patient Account Number: [id number] Date of Birth/Gender: Jun 12, 1921 (82 y.o. Female) Treating RN: Curtis Sites Primary Care Physician: Dorothey Baseman Other Clinician: Referring Physician: Dorothey Baseman Treating Physician/Extender: Rudene Re in Treatment: 61 Education Assessment Education Provided To: Patient and Caregiver Education Topics Provided Wound/Skin Impairment: Handouts: Other: continue washing with soap and water daily and using lotrisone cream Methods: Explain/Verbal Responses: State content correctly Electronic Signature(s) Signed: 01/22/2016 6:13:09 PM By: Curtis Sites Entered By: Curtis Sites on 01/22/2016 18:13:09 [name] ([id number]) -------------------------------------------------------------------------------- Wound Assessment Details Patient Name: [name] Date of Service: 01/22/2016 2:15 PM Medical Record Number: [id number] Patient Account Number: [id number] Date of Birth/Sex: 01-May-1921 (82 y.o. 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 Second Wound Open Wounding Event: Not Known Status: Date Acquired: 01/15/2016 Comorbid Arrhythmia, Deep  Vein Thrombosis, Weeks Of Treatment: 1 History: Peripheral Arterial Disease, Type II Clustered Wound: No Diabetes Photos Photo  Uploaded By: Curtis Sites on 01/22/2016 18:20:50 Wound Measurements Length: (cm) 0.7 Width: (cm) 0.8 Depth: (cm) 0.2 Area: (cm) 0.44 Volume: (cm) 0.088 % Reduction in Area: 0% % Reduction in Volume: 0% Epithelialization: None Tunneling: No Undermining: No Wound Description Full Thickness Without Classification: Exposed Support Structures Diabetic Severity Grade 1 (Wagner): Wound Margin: Flat and Intact Exudate Amount: Large Exudate Type: Serous Exudate Color: amber Foul Odor After Cleansing: Yes Due to Product Use: No Wound Bed Granulation Amount: None Present (0%) Exposed Structure Feldpausch, Kayton ([id number]) Necrotic Amount: Large (67-100%) Fascia Exposed: No Necrotic Quality: Eschar, Adherent Slough Fat Layer Exposed: No Tendon Exposed: No Muscle Exposed: No Joint Exposed: No Bone Exposed: No Limited to Skin Breakdown Periwound Skin Texture Texture Color No Abnormalities Noted: No No Abnormalities Noted: No Callus: No Atrophie Blanche: No Crepitus: No Cyanosis: No Excoriation: No Ecchymosis: No Fluctuance: No Erythema: Yes Friable: No Erythema Location: Circumferential Induration: No Hemosiderin Staining: No Localized Edema: Yes Mottled: No Rash: No Pallor: No Scarring: No Rubor: No Moisture Temperature / Pain No Abnormalities Noted: No Temperature: No Abnormality Dry / Scaly: No Tenderness on Palpation: Yes Maceration: Yes Moist: Yes Wound Preparation Ulcer Cleansing: Rinsed/Irrigated with Saline Topical Anesthetic Applied: Other: lidocaine 4%, Electronic Signature(s) Signed: 01/22/2016 6:41:05 PM By: Curtis Sites Entered By: Curtis Sites on 01/22/2016 15:01:53 [name] ([id number]) -------------------------------------------------------------------------------- Wound Assessment Details Patient Name: [name] Date of Service: 01/22/2016 2:15 PM Medical Record Number: [id number] Patient Account Number:  [id number] Date of Birth/Sex: [birth year] (82 y.o. Female) Treating RN: Curtis Sites Primary Care Physician: Dorothey Baseman Other Clinician: Referring Physician: Terance Hart, DAVID Treating Physician/Extender: Rudene Re in Treatment: 49 Wound Status Wound Number: 6 Primary Arterial Insufficiency Ulcer Etiology: Wound Location: Right Lower Leg - Posterior Wound Open Wounding Event: Blister Status: Date Acquired: 06/12/2015 Comorbid Arrhythmia, Deep Vein Thrombosis, Weeks Of Treatment: 31 History: Peripheral Arterial Disease, Type II Clustered Wound: No Diabetes Photos Photo Uploaded By: Curtis Sites on 01/22/2016 18:21:27 Wound Measurements Length: (cm) 10 Width: (cm) 11.5 Depth: (cm) 0.2 Area: (cm) 90.321 Volume: (cm) 18.064 % Reduction in Area: -11405.9% % Reduction in Volume: -22765.8% Epithelialization: Small (1-33%) Tunneling: No Undermining: No Wound Description Full Thickness Without Foul Odor Aft Classification: Exposed Support Structures Diabetic Severity Grade 1 (Wagner): Wound Margin: Flat and Intact Exudate Amount: Large Exudate Type: Serous Exudate Color: amber er Cleansing: No Wound Bed Granulation Amount: None Present (0%) Exposed Structure [name], [name] ([id number]) Necrotic Amount: Large (67-100%) Fascia Exposed: No Necrotic Quality: Adherent Slough Fat Layer Exposed: No Tendon Exposed: No Muscle Exposed: No Joint Exposed: No Bone Exposed: No Limited to Skin Breakdown Periwound Skin Texture Texture Color No Abnormalities Noted: No No Abnormalities Noted: No Callus: No Atrophie Blanche: No Crepitus: No Cyanosis: No Excoriation: No Ecchymosis: No Fluctuance: No Erythema: No Friable: No Hemosiderin Staining: No Induration: No Mottled: No Localized Edema: Yes Pallor: No Rash: No Rubor: No Scarring: No Temperature / Pain Moisture Temperature: No Abnormality No Abnormalities Noted: No Tenderness on Palpation:  Yes Dry / Scaly: No Maceration: No Moist: Yes Wound Preparation Ulcer Cleansing: Rinsed/Irrigated with Saline Topical Anesthetic Applied: Other: lidocaine 4%, Electronic Signature(s) Signed: 01/22/2016 6:41:05 PM By: Curtis Sites Entered By: Curtis Sites on 01/22/2016 15:03:59 [name] ([id number]) -------------------------------------------------------------------------------- Vitals Details Patient Name: [name] Date of Service: 01/22/2016 2:15 PM Medical Record Number: [id number] Patient Account Number: [id number] Date of Birth/Sex: 10/20/1920 (82 y.o.  Female) Treating RN: Curtis Sites Primary Care Physician: Dorothey Baseman Other Clinician: Referring Physician: Terance Hart, DAVID Treating Physician/Extender: Rudene Re in Treatment: 27 Vital Signs Time Taken: 14:19 Pulse (bpm): 60 Height (in): 62 Respiratory Rate (breaths/min): 16 Weight (lbs): 155 Blood Pressure (mmHg): 111/57 Body Mass Index (BMI): 28.3 Reference Range: 80 - 120 mg / dl Electronic Signature(s) Signed: 01/22/2016 6:41:05 PM By: Curtis Sites Entered By: Curtis Sites on 01/22/2016 14:21:09

## 2016-02-03 ENCOUNTER — Encounter: Payer: Self-pay | Admitting: *Deleted

## 2016-02-03 ENCOUNTER — Emergency Department: Payer: Medicare Other

## 2016-02-03 ENCOUNTER — Inpatient Hospital Stay
Admission: EM | Admit: 2016-02-03 | Discharge: 2016-02-13 | DRG: 854 | Disposition: A | Discharge status: Skilled Nursing Facility | Payer: Medicare Other | Attending: Internal Medicine | Admitting: Internal Medicine

## 2016-02-03 DIAGNOSIS — L22 Diaper dermatitis: Secondary | ICD-10-CM | POA: Diagnosis not present

## 2016-02-03 DIAGNOSIS — I739 Peripheral vascular disease, unspecified: Secondary | ICD-10-CM | POA: Diagnosis present

## 2016-02-03 DIAGNOSIS — E11622 Type 2 diabetes mellitus with other skin ulcer: Secondary | ICD-10-CM | POA: Diagnosis present

## 2016-02-03 DIAGNOSIS — I248 Other forms of acute ischemic heart disease: Secondary | ICD-10-CM | POA: Diagnosis present

## 2016-02-03 DIAGNOSIS — K5900 Constipation, unspecified: Secondary | ICD-10-CM | POA: Diagnosis not present

## 2016-02-03 DIAGNOSIS — I4891 Unspecified atrial fibrillation: Secondary | ICD-10-CM | POA: Diagnosis present

## 2016-02-03 DIAGNOSIS — Z66 Do not resuscitate: Secondary | ICD-10-CM | POA: Diagnosis present

## 2016-02-03 DIAGNOSIS — I509 Heart failure, unspecified: Secondary | ICD-10-CM | POA: Diagnosis present

## 2016-02-03 DIAGNOSIS — E11621 Type 2 diabetes mellitus with foot ulcer: Secondary | ICD-10-CM | POA: Diagnosis present

## 2016-02-03 DIAGNOSIS — I480 Paroxysmal atrial fibrillation: Secondary | ICD-10-CM | POA: Diagnosis present

## 2016-02-03 DIAGNOSIS — Z881 Allergy status to other antibiotic agents status: Secondary | ICD-10-CM | POA: Diagnosis not present

## 2016-02-03 DIAGNOSIS — I10 Essential (primary) hypertension: Secondary | ICD-10-CM | POA: Diagnosis not present

## 2016-02-03 DIAGNOSIS — A4189 Other specified sepsis: Secondary | ICD-10-CM | POA: Diagnosis present

## 2016-02-03 DIAGNOSIS — R000 Tachycardia, unspecified: Secondary | ICD-10-CM | POA: Diagnosis not present

## 2016-02-03 DIAGNOSIS — Z880 Allergy status to penicillin: Secondary | ICD-10-CM | POA: Diagnosis not present

## 2016-02-03 DIAGNOSIS — A419 Sepsis, unspecified organism: Secondary | ICD-10-CM

## 2016-02-03 DIAGNOSIS — E1151 Type 2 diabetes mellitus with diabetic peripheral angiopathy without gangrene: Secondary | ICD-10-CM | POA: Diagnosis present

## 2016-02-03 DIAGNOSIS — L97919 Non-pressure chronic ulcer of unspecified part of right lower leg with unspecified severity: Secondary | ICD-10-CM | POA: Diagnosis present

## 2016-02-03 DIAGNOSIS — L03115 Cellulitis of right lower limb: Secondary | ICD-10-CM | POA: Diagnosis present

## 2016-02-03 DIAGNOSIS — T474X5A Adverse effect of other laxatives, initial encounter: Secondary | ICD-10-CM | POA: Diagnosis not present

## 2016-02-03 DIAGNOSIS — Z96651 Presence of right artificial knee joint: Secondary | ICD-10-CM | POA: Diagnosis present

## 2016-02-03 DIAGNOSIS — I110 Hypertensive heart disease with heart failure: Secondary | ICD-10-CM | POA: Diagnosis present

## 2016-02-03 DIAGNOSIS — Z794 Long term (current) use of insulin: Secondary | ICD-10-CM | POA: Diagnosis not present

## 2016-02-03 DIAGNOSIS — L97509 Non-pressure chronic ulcer of other part of unspecified foot with unspecified severity: Secondary | ICD-10-CM | POA: Diagnosis present

## 2016-02-03 DIAGNOSIS — Z89512 Acquired absence of left leg below knee: Secondary | ICD-10-CM

## 2016-02-03 DIAGNOSIS — A4101 Sepsis due to Methicillin susceptible Staphylococcus aureus: Secondary | ICD-10-CM | POA: Diagnosis present

## 2016-02-03 DIAGNOSIS — L0390 Cellulitis, unspecified: Secondary | ICD-10-CM | POA: Diagnosis present

## 2016-02-03 DIAGNOSIS — R197 Diarrhea, unspecified: Secondary | ICD-10-CM | POA: Diagnosis not present

## 2016-02-03 DIAGNOSIS — A4151 Sepsis due to Escherichia coli [E. coli]: Principal | ICD-10-CM | POA: Diagnosis present

## 2016-02-03 HISTORY — DX: Gangrene, not elsewhere classified: I96

## 2016-02-03 HISTORY — DX: Non-pressure chronic ulcer of other part of right foot with unspecified severity: L97.519

## 2016-02-03 LAB — COMPREHENSIVE METABOLIC PANEL
ALT: 17 U/L (ref 14–54)
AST: 36 U/L (ref 15–41)
Albumin: 2.4 g/dL — ABNORMAL LOW (ref 3.5–5.0)
Alkaline Phosphatase: 73 U/L (ref 38–126)
Anion gap: 5 (ref 5–15)
BILIRUBIN TOTAL: 0.8 mg/dL (ref 0.3–1.2)
BUN: 37 mg/dL — AB (ref 6–20)
CHLORIDE: 97 mmol/L — AB (ref 101–111)
CO2: 31 mmol/L (ref 22–32)
CREATININE: 1.03 mg/dL — AB (ref 0.44–1.00)
Calcium: 8.5 mg/dL — ABNORMAL LOW (ref 8.9–10.3)
GFR calc Af Amer: 53 mL/min — ABNORMAL LOW (ref >60)
GFR, EST NON AFRICAN AMERICAN: 46 mL/min — AB (ref >60)
Glucose, Bld: 172 mg/dL — ABNORMAL HIGH (ref 65–99)
Potassium: 3.8 mmol/L (ref 3.5–5.1)
Sodium: 133 mmol/L — ABNORMAL LOW (ref 135–145)
Total Protein: 6.2 g/dL — ABNORMAL LOW (ref 6.5–8.1)

## 2016-02-03 LAB — URINALYSIS COMPLETE WITH MICROSCOPIC (ARMC ONLY)
Bilirubin Urine: NEGATIVE
Glucose, UA: NEGATIVE mg/dL
Ketones, ur: NEGATIVE mg/dL
LEUKOCYTES UA: NEGATIVE
NITRITE: NEGATIVE
PH: 5 (ref 5.0–8.0)
PROTEIN: NEGATIVE mg/dL
Specific Gravity, Urine: 1.014 (ref 1.005–1.030)

## 2016-02-03 LAB — CBC WITH DIFFERENTIAL/PLATELET
Basophils Absolute: 0.1 10*3/uL (ref 0–0.1)
Basophils Relative: 0 %
EOS ABS: 0 10*3/uL (ref 0–0.7)
Eosinophils Relative: 0 %
HEMATOCRIT: 30.3 % — AB (ref 35.0–47.0)
Hemoglobin: 10.3 g/dL — ABNORMAL LOW (ref 12.0–16.0)
LYMPHS ABS: 1.2 10*3/uL (ref 1.0–3.6)
Lymphocytes Relative: 5 %
MCH: 30.4 pg (ref 26.0–34.0)
MCHC: 33.9 g/dL (ref 32.0–36.0)
MCV: 89.7 fL (ref 80.0–100.0)
Monocytes Absolute: 0.7 10*3/uL (ref 0.2–0.9)
Neutro Abs: 23.8 10*3/uL — ABNORMAL HIGH (ref 1.4–6.5)
Neutrophils Relative %: 92 %
Platelets: 199 10*3/uL (ref 150–440)
RBC: 3.38 MIL/uL — AB (ref 3.80–5.20)
RDW: 17.2 % — ABNORMAL HIGH (ref 11.5–14.5)
WBC: 25.7 10*3/uL — AB (ref 3.6–11.0)

## 2016-02-03 LAB — LIPASE, BLOOD: Lipase: 18 U/L (ref 11–51)

## 2016-02-03 LAB — LACTIC ACID, PLASMA: Lactic Acid, Venous: 1.6 mmol/L (ref 0.5–2.0)

## 2016-02-03 LAB — APTT: APTT: 51 s — AB (ref 24–36)

## 2016-02-03 LAB — TROPONIN I: TROPONIN I: 0.38 ng/mL — AB (ref <0.031)

## 2016-02-03 LAB — PROTIME-INR
INR: 2.03
PROTHROMBIN TIME: 22.8 s — AB (ref 11.4–15.0)

## 2016-02-03 MED ORDER — ACETAMINOPHEN 325 MG PO TABS
650.0000 mg | ORAL_TABLET | Freq: Four times a day (QID) | ORAL | Status: DC | PRN
  Administered 2016-02-03 – 2016-02-04 (×2): 650 mg via ORAL
  Filled 2016-02-03 (×2): qty 2

## 2016-02-03 MED ORDER — SODIUM CHLORIDE 0.9 % IV BOLUS (SEPSIS)
500.0000 mL | INTRAVENOUS | Status: AC
  Administered 2016-02-03: 500 mL via INTRAVENOUS

## 2016-02-03 MED ORDER — METRONIDAZOLE IN NACL 5-0.79 MG/ML-% IV SOLN
500.0000 mg | Freq: Three times a day (TID) | INTRAVENOUS | Status: DC
Start: 2016-02-04 — End: 2016-02-12
  Administered 2016-02-04 – 2016-02-12 (×25): 500 mg via INTRAVENOUS
  Filled 2016-02-03 (×27): qty 100

## 2016-02-03 MED ORDER — SODIUM CHLORIDE 0.9 % IV SOLN
INTRAVENOUS | Status: DC
  Administered 2016-02-03: 21:00:00 via INTRAVENOUS

## 2016-02-03 MED ORDER — DEXTROSE 5 % IV SOLN
1.0000 g | Freq: Three times a day (TID) | INTRAVENOUS | Status: DC
  Administered 2016-02-04 – 2016-02-12 (×25): 1 g via INTRAVENOUS
  Filled 2016-02-03 (×28): qty 1

## 2016-02-03 MED ORDER — VANCOMYCIN HCL IN DEXTROSE 750-5 MG/150ML-% IV SOLN
750.0000 mg | INTRAVENOUS | Status: DC
  Administered 2016-02-04 – 2016-02-06 (×3): 750 mg via INTRAVENOUS
  Filled 2016-02-03 (×3): qty 150

## 2016-02-03 MED ORDER — ACETAMINOPHEN 650 MG RE SUPP: 650.0000 mg | Freq: Four times a day (QID) | RECTAL | Status: DC | PRN

## 2016-02-03 MED ORDER — DEXTROSE 5 % IV SOLN
2.0000 g | Freq: Once | INTRAVENOUS | Status: AC
  Administered 2016-02-03: 2 g via INTRAVENOUS
  Filled 2016-02-03: qty 2

## 2016-02-03 MED ORDER — SODIUM CHLORIDE 0.9 % IV BOLUS (SEPSIS): 1000.0000 mL | Freq: Once | INTRAVENOUS | Status: DC

## 2016-02-03 MED ORDER — SODIUM CHLORIDE 0.9 % IV BOLUS (SEPSIS)
1000.0000 mL | Freq: Once | INTRAVENOUS | Status: AC
  Administered 2016-02-03: 1000 mL via INTRAVENOUS

## 2016-02-03 MED ORDER — VANCOMYCIN HCL IN DEXTROSE 1-5 GM/200ML-% IV SOLN
1000.0000 mg | Freq: Once | INTRAVENOUS | Status: AC
  Administered 2016-02-03: 1000 mg via INTRAVENOUS
  Filled 2016-02-03: qty 200

## 2016-02-03 MED ORDER — METRONIDAZOLE IN NACL 5-0.79 MG/ML-% IV SOLN
500.0000 mg | Freq: Once | INTRAVENOUS | Status: AC
  Administered 2016-02-03: 500 mg via INTRAVENOUS
  Filled 2016-02-03: qty 100

## 2016-02-03 NOTE — ED Notes (Signed)
Pts leg was wrapped in gauze and tape per wound care nurse. Dressing is clean at this time, no drainage noted.

## 2016-02-03 NOTE — Progress Notes (Signed)
Pharmacy Antibiotic Note  [name] is a 82 y.o. female admitted on 02/03/2016 with cellulitis/diabetic foot with no evidence of osteo.  Pharmacy has been consulted for aztreonam, metronidazole, and vancomycin dosing.  Plan: 1. Aztreonam 2 gm IV x 1 in ED followed by 1 gm IV Q8H 2. Metronidazole 500 mg IV Q8H 3. Vancomycin 1 gm IV x 1 in ED followed in approximately 18 hours (stacked dosing) by vancomycin 750 mg IV Q24H, predicted trough 17 mcg/mL. Pharmacy will continue to follow and adjust as needed to maintain trough 15 to 20 mcg/mL.   Vd 39.8 L, Ke 0.03 hr-1, T1/2 22.8 hr  Height: 5' 2.99" (160 cm) Weight: 141 lb 15.6 oz (64.4 kg) IBW/kg (Calculated) : 52.38  Temp (24hrs), Avg:98.1 F (36.7 C), Min:98.1 F (36.7 C), Max:98.1 F (36.7 C)   Recent Labs Lab 02/03/16 2003 02/03/16 2135  WBC 25.7*  --   CREATININE 1.03*  --   LATICACIDVEN  --  1.6    Estimated Creatinine Clearance: 31.5 mL/min (by C-G formula based on Cr of 1.03).    Allergies  Allergen Reactions  . Amoxicillin   . Cephalexin   . Prednisolone   . Prednisone   . Toradol [Ketorolac Tromethamine]   . Tramadol     Thank you for allowing pharmacy to be a part of this patient's care.  Carola FrostNathan A Hoa Deriso, Pharm.D., BCPS Clinical Pharmacist 02/03/2016 11:56 PM

## 2016-02-03 NOTE — ED Notes (Signed)
Pt arrived to ED from home after family reports pt became red and "swollen" in the face and began sweating suddenly this evening. Family reports breathing was never labored and airway is intact upon arrival. Pt has cellulitis of the right leg that is being treated currently. Pt was prescribed Keflex by PCP but family reports pt is allergic to this medication. Family administered keflex and verbalized feeling as though this precipitated the event this evening. Pt is in no acute distress at this time and other than reporting chronic foot pain pt denies having a complaint at this time.

## 2016-02-03 NOTE — ED Provider Notes (Signed)
[facility] Emergency Department Provider Note  ____________________________________________  Time seen: Approximately 8:35 PM  I have reviewed the triage vital signs and the nursing notes.   HISTORY  Chief Complaint Wound Infection and Allergic Reaction    HPI [name] is a 82 y.o. 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  She presents for evaluation after developing cellulitis within the last couple of days on her right lower extremity.  She was seen by wound care couple of days ago and was told that she has developing cellulitis in her right lower leg.  She is also followed by Dr. Gilda CreaseSchnier, so the family called the office and they called in a prescription for Keflex and schedule an appointment for Thursday.  They gave the patient a first dose of Keflex today, but the daughter-in-law, who is her primary caregiver, did not notice it was Keflex and reports that the patient has had an adverse reaction to Keflex in the past.  She ran an errand and then came back and found that the patient's face was red and "swollen" and she was sweating and felt very warm, but she did not have any difficulty breathing or shortness of breath or stridorous respirations.  She brought her to the emergency department for further evaluation of her leg in addition to concern about the antibiotic choice, because the caregiver reports that the redness in the leg has spread just over the course of today from just the lower part of the leg to now going well up past the knee.  Patient does report feeling subjective fever and had a measured temperature at home of 99.6.  She is afebrile upon arrival to the emergency department.  She states that she feels generally weak and fatigued but does not have any specific symptoms.  She denies chest pain, shortness of breath, nausea,  vomiting, diarrhea, dysuria.  She endorses a gradual onset and worsening pain in her right lower extremity over the last couple days which is different than normal for her.  Gradual onset, moderate in severity.  Of note, she takes Eliquis and has not missed any doses.   Past Medical History  Diagnosis Date  . Peripheral vascular disease (HCC)   . Dysrhythmia   . Diabetes mellitus without complication (HCC)   . Hypertension   . CHF (congestive heart failure) (HCC)   . Atrial fibrillation (HCC)   . Gout   . Foot ulcer, right (HCC)   . Gangrene of toe [facility](HCC)     Patient Active Problem List   Diagnosis Date Noted  . DM foot (HCC) 05/27/2015  . Ulcer of foot due to diabetes mellitus (HCC) 05/27/2015  . Diabetic foot ulcer (HCC) 05/27/2015    Past Surgical History  Procedure Laterality Date  . Leg amputation through knee Left   . Peripheral vascular catheterization Right 04/08/2015    Procedure: Lower Extremity Angiography;  Surgeon: Renford DillsGregory G Schnier, MD;  Location: [facility] INVASIVE CV LAB;  Service: Cardiovascular;  Laterality: Right;  . Peripheral vascular catheterization  04/08/2015    Procedure: Lower Extremity Intervention;  Surgeon: Renford DillsGregory G Schnier, MD;  Location: [facility] INVASIVE CV LAB;  Service: Cardiovascular;;  . Joint replacement    . Right knee replacement    . Eye surgery    . Peripheral vascular catheterization N/A 07/15/2015    Procedure: Abdominal Aortogram w/Lower Extremity;  Surgeon:  Renford Dills, MD;  Location: [facility] INVASIVE CV LAB;  Service: Cardiovascular;  Laterality: N/A;  . Peripheral vascular catheterization  07/15/2015    Procedure: Lower Extremity Intervention;  Surgeon: Renford Dills, MD;  Location: [facility] INVASIVE CV LAB;  Service: Cardiovascular;;    Current Outpatient Rx  Name  Route  Sig  Dispense  Refill  . apixaban (ELIQUIS) 5 MG TABS tablet   Oral   Take 5 mg by mouth 1 day or 1 dose.          . Cholecalciferol 2000 UNITS CAPS   Oral    Take 5,000 Units by mouth every morning.          . collagenase (SANTYL) ointment   Topical   Apply 1 application topically daily.         . Copper Gluconate 2 MG TABS   Oral   Take 2 mg by mouth 1 day or 1 dose.         . digoxin (LANOXIN) 0.125 MG tablet   Oral   Take 0.125 mg by mouth daily.          Marland Kitchen docusate sodium (COLACE) 100 MG capsule   Oral   Take 100 mg by mouth 2 (two) times daily.         . ferrous sulfate 325 (65 FE) MG tablet   Oral   Take 325 mg by mouth 3 (three) times daily with meals.         . furosemide (LASIX) 20 MG tablet   Oral   Take 20 mg by mouth 2 (two) times daily.          . metoprolol succinate (TOPROL-XL) 50 MG 24 hr tablet   Oral   Take 50 mg by mouth 2 (two) times daily. Take with or immediately following a meal.         . Multiple Vitamin (MULTIVITAMIN) capsule   Oral   Take 1 capsule by mouth daily.         . Nutritional Supplements (VIT GLUCOSAMINE COMPLEX PO)   Oral   Take by mouth 1 day or 1 dose.         Marland Kitchen oxycodone (OXY-IR) 5 MG capsule   Oral   Take 5 mg by mouth every 4 (four) hours as needed.         . potassium chloride (K-DUR) 10 MEQ tablet   Oral   Take 10 mEq by mouth daily.         . quinapril (ACCUPRIL) 10 MG tablet   Oral   Take 10 mg by mouth daily.         Marland Kitchen selenium 50 MCG TABS tablet   Oral   Take 200 mcg by mouth daily.         Marland Kitchen zinc gluconate 50 MG tablet   Oral   Take 50 mg by mouth daily.           Allergies Amoxicillin; Cephalexin; Prednisolone; Prednisone; Toradol; and Tramadol  History reviewed. No pertinent family history.  Social History Social History  Substance Use Topics  . Smoking status: Former Smoker    Quit date: 12/02/1954  . Smokeless tobacco: None  . Alcohol Use: No    Review of Systems Constitutional: Subjective fever/chills Eyes: No visual changes. ENT: No sore throat. Cardiovascular: Denies chest pain. Respiratory: Denies shortness  of breath. Gastrointestinal: No abdominal pain.  No nausea, no vomiting.  No diarrhea.  No constipation. Genitourinary: Negative  for dysuria. Musculoskeletal: Worsening pain throughout right lower extremity Skin: Negative for rash. Neurological: Negative for headaches, focal weakness or numbness.  10-point ROS otherwise negative.  ____________________________________________   PHYSICAL EXAM:  VITAL SIGNS: ED Triage Vitals  Enc Vitals Group     BP 02/03/16 2016 114/49 mmHg     Pulse Rate 02/03/16 2016 95     Resp 02/03/16 2016 16     Temp 02/03/16 2016 98.1 F (36.7 C)     Temp Source 02/03/16 2016 Oral     SpO2 02/03/16 2016 99 %     Weight --      Height --      Head Cir --      Peak Flow --      Pain Score --      Pain Loc --      Pain Edu? --      Excl. in GC? --     Constitutional: Alert and oriented. Well appearing and in no acute distress. Eyes: Conjunctivae are normal. PERRL. EOMI. Head: Atraumatic. Nose: No congestion/rhinnorhea. Mouth/Throat: Mucous membranes are moist.  Oropharynx non-erythematous. Neck: No stridor.  No meningeal signs.   Cardiovascular: Normal rate, regular rhythm. Good peripheral circulation. Grossly normal heart sounds.   Respiratory: Normal respiratory effort.  No retractions. Lungs CTAB. Gastrointestinal: Soft and nontender. No distention.  Musculoskeletal: No lower extremity tenderness nor edema. No gross deformities of extremities. Neurologic:  Normal speech and language. No gross focal neurologic deficits are appreciated.  Skin:  Skin is warm, dry.  Extensive chronic wounds in the posterior right lower leg as well as an apparently more acute wound on the top of her second toe.  There is a minimal amount of purulence that pulled away on the dressings when I removed them.  There is extensive erythema consistent with cellulitis throughout the right lower extremity and it has moved proximal to her knee, covering most of her thigh and  approximately 75% of the entire right lower extremity.  It is warm to the touch and her leg is generally tender but the compartments are soft. Psychiatric: Mood and affect are normal. Speech and behavior are normal.  ____________________________________________   LABS (all labs ordered are listed, but only abnormal results are displayed)  Labs Reviewed  COMPREHENSIVE METABOLIC PANEL - Abnormal; Notable for the following:    Sodium 133 (*)    Chloride 97 (*)    Glucose, Bld 172 (*)    BUN 37 (*)    Creatinine, Ser 1.03 (*)    Calcium 8.5 (*)    Total Protein 6.2 (*)    Albumin 2.4 (*)    GFR calc non Af Amer 46 (*)    GFR calc Af Amer 53 (*)    All other components within normal limits  CBC WITH DIFFERENTIAL/PLATELET - Abnormal; Notable for the following:    WBC 25.7 (*)    RBC 3.38 (*)    Hemoglobin 10.3 (*)    HCT 30.3 (*)    RDW 17.2 (*)    Neutro Abs 23.8 (*)    All other components within normal limits  URINALYSIS COMPLETEWITH MICROSCOPIC ([facility] ONLY) - Abnormal; Notable for the following:    Color, Urine YELLOW (*)    APPearance HAZY (*)    Hgb urine dipstick 1+ (*)    Bacteria, UA RARE (*)    Squamous Epithelial / LPF 0-5 (*)    All other components within normal limits  APTT - Abnormal; Notable for  the following:    aPTT 51 (*)    All other components within normal limits  PROTIME-INR - Abnormal; Notable for the following:    Prothrombin Time 22.8 (*)    All other components within normal limits  CULTURE, BLOOD (ROUTINE X 2)  URINE CULTURE  WOUND CULTURE  ANAEROBIC CULTURE  LACTIC ACID, PLASMA  LACTIC ACID, PLASMA  LIPASE, BLOOD  TROPONIN I   ____________________________________________  EKG  None ____________________________________________  RADIOLOGY   No results found.  ____________________________________________   PROCEDURES  Procedure(s) performed: None  Critical Care performed: Yes, see critical care note(s)   CRITICAL  CARE Performed by: Loleta Rose   Total critical care time: 45 minutes  Critical care time was exclusive of separately billable procedures and treating other patients.  Critical care was necessary to treat or prevent imminent or life-threatening deterioration.  Critical care was time spent personally by me on the following activities: development of treatment plan with patient and/or surrogate as well as nursing, discussions with consultants, evaluation of patient's response to treatment, examination of patient, obtaining history from patient or surrogate, ordering and performing treatments and interventions, ordering and review of laboratory studies, ordering and review of radiographic studies, pulse oximetry and re-evaluation of patient's condition.  ____________________________________________   INITIAL IMPRESSION / ASSESSMENT AND PLAN / ED COURSE  Pertinent labs & imaging results that were available during my care of the patient were reviewed by me and considered in my medical decision making (see chart for details).  Saline was covering about 75% of the right lower extremity.  Her caregiver reports that his progressed from below the knee all the way to the middle of the thigh just over the course of the day today.  She apparently has a questionable allergic reaction to cephalosporins (although it is possible that she may have simply been febrile the time).  She also has a reported allergy to penicillin.  Even all that, I am giving her vancomycin, aztreonam, and Flagyl, which is the Hospital recommendation for cellulitis treatment for penicillin allergic patients.  She does not meet sepsis criteria at this time although if she has a significant leukocytosis she may.  Her blood pressure is on the low side but the patient is alert and oriented and in no acute distress.  I am giving her a 500 mL normal saline bolus and starting 100 mL per hour saline drip.  Cultures were definitely obtained  prior to starting empiric antibiotics.  ----------------------------------------- 9:56 PM on 02/03/2016 -----------------------------------------  Her CBC has resulted and she has a white count of nearly 26.  That, combined with her pulse is greater than 90, qualifies as sepsis criteria along with her known source of right lower extremity cellulitis.  The patient is a 30 receive 500 mL normal saline, and I added on an additional 1.5 L to reflect 30 mL/kg.  However this may be adjusted if her lactate is within normal limits, it is currently still pending.  ----------------------------------------- 10:39 PM on 02/03/2016 -----------------------------------------  This documentation is delayed, but the patient's lactate was normal but I continued with 30 mL/kg of IV fluid given the degree of leukocytosis.  Given the very mild tenderness in the right lower leg and no crepitus, I strongly doubt that her infection represents necrotizing fasciitis.  I believe that she has rapidly progressing cellulitis and she is being covered adequately with the broad-spectrum antibiotics given.  I have discussed the case with the hospitalist who will admit.  She remains hemodynamically  stable.   ____________________________________________  FINAL CLINICAL IMPRESSION(S) / ED DIAGNOSES  Final diagnoses:  Sepsis, due to unspecified organism (HCC)  Cellulitis of right lower extremity     MEDICATIONS GIVEN DURING THIS VISIT:  Medications  0.9 %  sodium chloride infusion ( Intravenous New Bag/Given 02/03/16 2101)  vancomycin (VANCOCIN) IVPB 1000 mg/200 mL premix (1,000 mg Intravenous New Bag/Given 02/03/16 2127)  aztreonam (AZACTAM) 2 g in dextrose 5 % 50 mL IVPB   metroNIDAZOLE (FLAGYL) IVPB 500 mg (500 mg Intravenous New Bag/Given 02/03/16 2128)  sodium chloride 0.9 % bolus 1,000 mL   sodium chloride 0.9 % bolus 500 mL   sodium chloride 0.9 % bolus 500 mL (500 mLs Intravenous New Bag/Given 02/03/16 2135)     NEW  OUTPATIENT MEDICATIONS STARTED DURING THIS VISIT:  New Prescriptions   No medications on file      Note:  This document was prepared using Dragon voice recognition software and may include unintentional dictation errors.   Loleta Rose, MD 02/03/16 2240

## 2016-02-03 NOTE — ED Notes (Signed)
PT verbalized having right leg pain. PRN tylenol used at this time.

## 2016-02-03 NOTE — H&P (Signed)
[facility] [place] [facility]   PATIENT NAME: [name] NationsHelmi [name]    MR#:  [id number]  DATE OF BIRTH:  06/12/1921  DATE OF ADMISSION:  02/03/2016  PRIMARY CARE PHYSICIAN: Dorothey BasemanAVID BRONSTEIN, MD   REQUESTING/REFERRING PHYSICIAN: York CeriseForbach, MD  CHIEF COMPLAINT:   Chief Complaint  Patient presents with  . Wound Infection  . Allergic Reaction    HISTORY OF PRESENT ILLNESS:  [name] who presents with 3 days of progressive cellulitis of the right lower extremity. She went to her physician today and got outpatient antibiotics, but had a subsequent reaction to the by mouth Keflex she was prescribed. Also, her cellulitis progressed throughout the course of the day from mid calf to lower thigh. She was also found to have a white count of 25,000. In light of all this, hospitalists were called for admission  PAST MEDICAL HISTORY:   Past Medical History  Diagnosis Date  . Peripheral vascular disease (HCC)   . Dysrhythmia   . Diabetes mellitus without complication (HCC)   . Hypertension   . CHF (congestive heart failure) (HCC)   . Atrial fibrillation (HCC)   . Gout   . Foot ulcer, right (HCC)   . Gangrene of toe (HCC)     PAST SURGICAL HISTORY:   Past Surgical History  Procedure Laterality Date  . Leg amputation through knee Left   . Peripheral vascular catheterization Right 04/08/2015    Procedure: Lower Extremity Angiography;  Surgeon: Renford DillsGregory G Schnier, MD;  Location: [facility] INVASIVE CV LAB;  Service: Cardiovascular;  Laterality: Right;  . Peripheral vascular catheterization  04/08/2015    Procedure: Lower Extremity Intervention;  Surgeon: Renford DillsGregory G Schnier, MD;  Location: [facility] INVASIVE CV LAB;  Service: Cardiovascular;;  . Joint replacement    . Right knee replacement    . Eye surgery    . Peripheral vascular catheterization N/A 07/15/2015    Procedure: Abdominal Aortogram w/Lower Extremity;  Surgeon: Renford DillsGregory G Schnier, MD;   Location: [facility] INVASIVE CV LAB;  Service: Cardiovascular;  Laterality: N/A;  . Peripheral vascular catheterization  07/15/2015    Procedure: Lower Extremity Intervention;  Surgeon: Renford DillsGregory G Schnier, MD;  Location: [facility] INVASIVE CV LAB;  Service: Cardiovascular;;    SOCIAL HISTORY:   Social History  Substance Use Topics  . Smoking status: Former Smoker    Quit date: 12/02/1954  . Smokeless tobacco: Not on file  . Alcohol Use: No    FAMILY HISTORY:  History reviewed. No pertinent family history.  DRUG ALLERGIES:   Allergies  Allergen Reactions  . Amoxicillin   . Cephalexin   . Prednisolone   . Prednisone   . Toradol [Ketorolac Tromethamine]   . Tramadol     MEDICATIONS AT HOME:   Prior to Admission medications   Medication Sig Start Date End Date Taking? Authorizing Provider  apixaban (ELIQUIS) 5 MG TABS tablet Take 5 mg by mouth 1 day or 1 dose.     Historical Provider, MD  Cholecalciferol 2000 UNITS CAPS Take 5,000 Units by mouth every morning.     Historical Provider, MD  collagenase (SANTYL) ointment Apply 1 application topically daily.    Historical Provider, MD  Copper Gluconate 2 MG TABS Take 2 mg by mouth 1 day or 1 dose.    Historical Provider, MD  digoxin (LANOXIN) 0.125 MG tablet Take 0.125 mg by mouth daily.     Historical Provider, MD  docusate sodium (COLACE) 100 MG capsule  Take 100 mg by mouth 2 (two) times daily.    Historical Provider, MD  ferrous sulfate 325 (65 FE) MG tablet Take 325 mg by mouth 3 (three) times daily with meals.    Historical Provider, MD  furosemide (LASIX) 20 MG tablet Take 20 mg by mouth 2 (two) times daily.     Historical Provider, MD  metoprolol succinate (TOPROL-XL) 50 MG 24 hr tablet Take 50 mg by mouth 2 (two) times daily. Take with or immediately following a meal.    Historical Provider, MD  Multiple Vitamin (MULTIVITAMIN) capsule Take 1 capsule by mouth daily.    Historical Provider, MD  Nutritional Supplements (VIT GLUCOSAMINE  COMPLEX PO) Take by mouth 1 day or 1 dose.    Historical Provider, MD  oxycodone (OXY-IR) 5 MG capsule Take 5 mg by mouth every 4 (four) hours as needed.    Historical Provider, MD  potassium chloride (K-DUR) 10 MEQ tablet Take 10 mEq by mouth daily.    Historical Provider, MD  quinapril (ACCUPRIL) 10 MG tablet Take 10 mg by mouth daily.    Historical Provider, MD  selenium 50 MCG TABS tablet Take 200 mcg by mouth daily.    Historical Provider, MD  zinc gluconate 50 MG tablet Take 50 mg by mouth daily.    Historical Provider, MD    REVIEW OF SYSTEMS:  Review of Systems  Constitutional: Negative for fever, chills, weight loss and malaise/fatigue.  HENT: Negative for ear pain, hearing loss and tinnitus.   Eyes: Negative for blurred vision, double vision, pain and redness.  Respiratory: Negative for cough, hemoptysis and shortness of breath.   Cardiovascular: Negative for chest pain, palpitations, orthopnea and leg swelling.  Gastrointestinal: Negative for nausea, vomiting, abdominal pain, diarrhea and constipation.  Genitourinary: Negative for dysuria, frequency and hematuria.  Musculoskeletal: Negative for back pain, joint pain and neck pain.       Right lower extremity pain  Skin:       Right lower extremity erythema, warmth, tenderness, multiple small ulcerations  Neurological: Negative for dizziness, tremors, focal weakness and weakness.  Endo/Heme/Allergies: Negative for polydipsia. Does not bruise/bleed easily.  Psychiatric/Behavioral: Negative for depression. The patient is not nervous/anxious and does not have insomnia.      VITAL SIGNS:   Filed Vitals:   02/03/16 2016 02/03/16 2227  BP: 114/49 109/50  Pulse: 95 100  Temp: 98.1 F (36.7 C)   TempSrc: Oral   Resp: 16 18  SpO2: 99% 100%   Wt Readings from Last 3 Encounters:  07/15/15 64.411 kg (142 lb)  04/08/15 64.864 kg (143 lb)    PHYSICAL EXAMINATION:  Physical Exam  Vitals reviewed. Constitutional: She is  oriented to person, place, and time. She appears well-developed and well-nourished. No distress.  HENT:  Head: Normocephalic and atraumatic.  Mouth/Throat: Oropharynx is clear and moist.  Eyes: Conjunctivae and EOM are normal. Pupils are equal, round, and reactive to light. No scleral icterus.  Neck: Normal range of motion. Neck supple. No JVD present. No thyromegaly present.  Cardiovascular: Normal rate and intact distal pulses.  Exam reveals no gallop and no friction rub.   No murmur heard. Irregular rhythm  Respiratory: Effort normal and breath sounds normal. No respiratory distress. She has no wheezes. She has no rales.  GI: Soft. Bowel sounds are normal. She exhibits no distension. There is no tenderness.  Musculoskeletal: Normal range of motion. She exhibits edema and tenderness (right lower extremity).  No arthritis, no gout  Lymphadenopathy:  She has no cervical adenopathy.  Neurological: She is alert and oriented to person, place, and time. No cranial nerve deficit.  No dysarthria, no aphasia  Skin: Skin is warm and dry. No rash noted. There is erythema (Right lower extremity).  Psychiatric: She has a normal mood and affect. Her behavior is normal. Judgment and thought content normal.    LABORATORY PANEL:   CBC  Recent Labs Lab 02/03/16 2003  WBC 25.7*  HGB 10.3*  HCT 30.3*  PLT 199   ------------------------------------------------------------------------------------------------------------------  Chemistries   Recent Labs Lab 02/03/16 2003  NA 133*  K 3.8  CL 97*  CO2 31  GLUCOSE 172*  BUN 37*  CREATININE 1.03*  CALCIUM 8.5*  AST 36  ALT 17  ALKPHOS 73  BILITOT 0.8   ------------------------------------------------------------------------------------------------------------------  Cardiac Enzymes No results for input(s): TROPONINI in the last 168  hours. ------------------------------------------------------------------------------------------------------------------  RADIOLOGY:  Dg Chest Port 1 View  02/03/2016  CLINICAL DATA:  Facial swelling and erythema. Reese's. Sudden onset this evening. EXAM: PORTABLE CHEST 1 VIEW COMPARISON:  12/26/2014 FINDINGS: A single AP portable view of the chest demonstrates no focal airspace consolidation or alveolar edema. The lungs are grossly clear. There is no large effusion or pneumothorax. There is unchanged cardiomegaly. Cardiac and mediastinal contours are otherwise unremarkable. IMPRESSION: Unchanged cardiomegaly.  No acute cardiopulmonary findings. Electronically Signed   By: Ellery Plunk M.D.   On: 02/03/2016 22:21    EKG:   Orders placed or performed in visit on 11/21/14  . EKG 12-Lead    IMPRESSION AND PLAN:  Principal Problem:   Cellulitis - see history of present illness for details, we'll continue IV antibiotics that were started in the ED. Blood and wound cultures pending. Active Problems:   Diabetic foot ulcer (HCC) - antibiotics and cultures as above. Sliding scale insulin with corresponding glucose checks before meals and at bedtime and a carb modified diet   PVD (peripheral vascular disease) (HCC) - continue home meds including anticoagulation   HTN (hypertension) - stable, low and normal, will hold home antihypertensives for now except those that her rate controlling for her A. fib.   A-fib (HCC) - continue rate controlling medications and anticoagulation   CHF (congestive heart failure) (HCC) - continue home meds  All the records are reviewed and case discussed with ED provider. Management plans discussed with the patient and/or family.  DVT PROPHYLAXIS: Systemic anticoagulation  GI PROPHYLAXIS: None  ADMISSION STATUS: Inpatient  CODE STATUS: Full Code Status History    Date Active Date Inactive Code Status Order ID Comments User Context   07/15/2015  4:09 PM  07/15/2015  9:52 PM Full Code [id number]  Renford Dills, MD Inpatient   04/08/2015  9:38 AM 04/08/2015  3:24 PM Full Code [id number]  Renford Dills, MD Inpatient      TOTAL TIME TAKING CARE OF THIS PATIENT: 45 minutes.    Marquavion Venhuizen FIELDING 02/03/2016, 10:36 PM  [facility]  Office  [phone]  CC: Primary care physician; Dorothey Baseman, MD

## 2016-02-03 NOTE — ED Notes (Signed)
Pt rolled from left side to right after family reports pt has been lying on left side since Sunday. No skin break down noted at this time. One small 3 inch by 1 inch are is red but is blanching when pressed. Pt has pillow behind back and is lying on right side comfortably at this time.

## 2016-02-04 LAB — GLUCOSE, CAPILLARY
GLUCOSE-CAPILLARY: 93 mg/dL (ref 65–99)
GLUCOSE-CAPILLARY: 141 mg/dL — AB (ref 65–99)
GLUCOSE-CAPILLARY: 181 mg/dL — AB (ref 65–99)
Glucose-Capillary: 113 mg/dL — ABNORMAL HIGH (ref 65–99)
Glucose-Capillary: 130 mg/dL — ABNORMAL HIGH (ref 65–99)

## 2016-02-04 LAB — CBC
HEMATOCRIT: 30.3 % — AB (ref 35.0–47.0)
HEMOGLOBIN: 10.1 g/dL — AB (ref 12.0–16.0)
MCH: 30.8 pg (ref 26.0–34.0)
MCHC: 33.5 g/dL (ref 32.0–36.0)
MCV: 92 fL (ref 80.0–100.0)
Platelets: 178 10*3/uL (ref 150–440)
RBC: 3.29 MIL/uL — AB (ref 3.80–5.20)
RDW: 16.7 % — ABNORMAL HIGH (ref 11.5–14.5)
WBC: 24.7 10*3/uL — ABNORMAL HIGH (ref 3.6–11.0)

## 2016-02-04 LAB — TROPONIN I
TROPONIN I: 0.17 ng/mL — AB (ref <0.031)
Troponin I: 0.25 ng/mL — ABNORMAL HIGH (ref <0.031)

## 2016-02-04 LAB — BASIC METABOLIC PANEL
ANION GAP: 7 (ref 5–15)
BUN: 31 mg/dL — ABNORMAL HIGH (ref 6–20)
CO2: 28 mmol/L (ref 22–32)
Calcium: 8.1 mg/dL — ABNORMAL LOW (ref 8.9–10.3)
Chloride: 103 mmol/L (ref 101–111)
Creatinine, Ser: 0.75 mg/dL (ref 0.44–1.00)
GLUCOSE: 103 mg/dL — AB (ref 65–99)
POTASSIUM: 3.6 mmol/L (ref 3.5–5.1)
Sodium: 138 mmol/L (ref 135–145)

## 2016-02-04 MED ORDER — DILTIAZEM HCL 30 MG PO TABS
60.0000 mg | ORAL_TABLET | Freq: Three times a day (TID) | ORAL | Status: DC
  Administered 2016-02-04 – 2016-02-06 (×7): 60 mg via ORAL
  Filled 2016-02-04 (×7): qty 2

## 2016-02-04 MED ORDER — FUROSEMIDE 20 MG PO TABS
20.0000 mg | ORAL_TABLET | Freq: Two times a day (BID) | ORAL | Status: DC
  Administered 2016-02-04 – 2016-02-08 (×10): 20 mg via ORAL
  Filled 2016-02-04 (×10): qty 1

## 2016-02-04 MED ORDER — INSULIN ASPART 100 UNIT/ML ~~LOC~~ SOLN: 0.0000 [IU] | Freq: Every day | SUBCUTANEOUS | Status: DC

## 2016-02-04 MED ORDER — SODIUM CHLORIDE 0.9% FLUSH
3.0000 mL | Freq: Two times a day (BID) | INTRAVENOUS | Status: DC
  Administered 2016-02-04 – 2016-02-12 (×15): 3 mL via INTRAVENOUS

## 2016-02-04 MED ORDER — DOCUSATE SODIUM 100 MG PO CAPS
200.0000 mg | ORAL_CAPSULE | Freq: Two times a day (BID) | ORAL | Status: DC
  Administered 2016-02-04 – 2016-02-12 (×16): 200 mg via ORAL
  Filled 2016-02-04 (×16): qty 2

## 2016-02-04 MED ORDER — APIXABAN 5 MG PO TABS: 5.0000 mg | ORAL_TABLET | ORAL | Status: DC

## 2016-02-04 MED ORDER — INSULIN ASPART 100 UNIT/ML ~~LOC~~ SOLN
0.0000 [IU] | Freq: Three times a day (TID) | SUBCUTANEOUS | Status: DC
  Administered 2016-02-04: 1 [IU] via SUBCUTANEOUS
  Administered 2016-02-04 – 2016-02-10 (×6): 2 [IU] via SUBCUTANEOUS
  Administered 2016-02-10 – 2016-02-12 (×3): 1 [IU] via SUBCUTANEOUS
  Administered 2016-02-12 – 2016-02-13 (×2): 2 [IU] via SUBCUTANEOUS
  Filled 2016-02-04: qty 2
  Filled 2016-02-04: qty 1
  Filled 2016-02-04: qty 2
  Filled 2016-02-04: qty 1
  Filled 2016-02-04 (×6): qty 2
  Filled 2016-02-04: qty 1
  Filled 2016-02-04: qty 2

## 2016-02-04 MED ORDER — ONDANSETRON HCL 4 MG/2ML IJ SOLN
4.0000 mg | Freq: Four times a day (QID) | INTRAMUSCULAR | Status: DC | PRN
  Administered 2016-02-12: 4 mg via INTRAVENOUS
  Filled 2016-02-04: qty 2

## 2016-02-04 MED ORDER — DIGOXIN 125 MCG PO TABS
0.1250 mg | ORAL_TABLET | Freq: Every day | ORAL | Status: DC
  Administered 2016-02-04 – 2016-02-13 (×10): 0.125 mg via ORAL
  Filled 2016-02-04 (×10): qty 1

## 2016-02-04 MED ORDER — DIGOXIN 0.25 MG/ML IJ SOLN: 0.2500 mg | Freq: Once | INTRAMUSCULAR | Status: DC

## 2016-02-04 MED ORDER — ONDANSETRON HCL 4 MG PO TABS: 4.0000 mg | ORAL_TABLET | Freq: Four times a day (QID) | ORAL | Status: DC | PRN

## 2016-02-04 MED ORDER — METOPROLOL SUCCINATE ER 50 MG PO TB24
50.0000 mg | ORAL_TABLET | Freq: Two times a day (BID) | ORAL | Status: DC
  Administered 2016-02-04 – 2016-02-08 (×8): 50 mg via ORAL
  Filled 2016-02-04 (×9): qty 1

## 2016-02-04 MED ORDER — OXYCODONE HCL 5 MG PO TABS
5.0000 mg | ORAL_TABLET | ORAL | Status: DC | PRN
  Administered 2016-02-04 – 2016-02-07 (×16): 5 mg via ORAL
  Filled 2016-02-04 (×16): qty 1

## 2016-02-04 MED ORDER — APIXABAN 5 MG PO TABS
5.0000 mg | ORAL_TABLET | Freq: Two times a day (BID) | ORAL | Status: DC
  Administered 2016-02-04 – 2016-02-13 (×18): 5 mg via ORAL
  Filled 2016-02-04 (×18): qty 1

## 2016-02-04 NOTE — Progress Notes (Signed)
[facility] [place] at [facility]                                                                                                                                                                                            Patient Demographics   [name], is a 82 y.o. female, DOB - 01-29-1921, ZOX:[id number]  Admit date - 02/03/2016   Admitting Physician Oralia Manis, MD  Outpatient Primary MD for the patient is DAVID Terance Hart, MD   LOS - 1  Subjective:Patient still has significant pain in the right leg. Heart rate elevated overnight. Denies any fevers chills no chest pains     Review of Systems:   CONSTITUTIONAL: Positive fever. No fatigue, weakness. No weight gain, no weight loss.  EYES: No blurry or double vision.  ENT: No tinnitus. No postnasal drip. No redness of the oropharynx.  RESPIRATORY: No cough, no wheeze, no hemoptysis. No dyspnea.  CARDIOVASCULAR: No chest pain. No orthopnea. No palpitations. No syncope.  GASTROINTESTINAL: No nausea, no vomiting or diarrhea. No abdominal pain. No melena or hematochezia.  GENITOURINARY: No dysuria or hematuria.  ENDOCRINE: No polyuria or nocturia. No heat or cold intolerance.  HEMATOLOGY: No anemia. No bruising. No bleeding.  INTEGUMENTARY: Right leg swelling and redness.  MUSCULOSKELETAL: No arthritis. No swelling. No gout.  NEUROLOGIC: No numbness, tingling, or ataxia. No seizure-type activity.  PSYCHIATRIC: No anxiety. No insomnia. No ADD.    Vitals:   Filed Vitals:   02/03/16 2300 02/04/16 0018 02/04/16 0030 02/04/16 0505  BP:  110/59  104/51  Pulse:  129 112 103  Temp:  98.2 F (36.8 C)  98.3 F (36.8 C)  TempSrc:  Oral  Oral  Resp:  17  20  Height: 5' 2.99" (1.6 m)     Weight: 64.4 kg (141 lb 15.6 oz)     SpO2:  95%  96%    Wt Readings from Last 3 Encounters:  02/03/16 64.4 kg (141 lb 15.6 oz)  07/15/15 64.411 kg (142 lb)  04/08/15 64.864 kg (143 lb)     Intake/Output Summary  (Last 24 hours) at 02/04/16 1026 Last data filed at 02/04/16 0900  Gross per 24 hour  Intake    240 ml  Output    150 ml  Net     90 ml    Physical Exam:   GENERAL: Pleasant-appearing in no apparent distress.  HEAD, EYES, EARS, NOSE AND THROAT: Atraumatic, normocephalic. Extraocular muscles are intact. Pupils equal and reactive to light. Sclerae anicteric. No conjunctival injection. No oro-pharyngeal erythema.  NECK: Supple. There is no jugular venous distention. No bruits,  no lymphadenopathy, no thyromegaly.  HEART: Regular rate and rhythm,. No murmurs, no rubs, no clicks.  LUNGS: Clear to auscultation bilaterally. No rales or rhonchi. No wheezes.  ABDOMEN: Soft, flat, nontender, nondistended. Has good bowel sounds. No hepatosplenomegaly appreciated.  EXTREMITIES: Left BKA , right foot with erythema extending up higher to the leg dressing in place  NEUROLOGIC: The patient is alert, awake, and oriented x3 with no focal motor or sensory deficits appreciated bilaterally.  SKIN: Moist and warm with no rashes appreciated.  Psych: Not anxious, depressed LN: No inguinal LN enlargement    Antibiotics   Anti-infectives    Start     Dose/Rate Route Frequency Ordered Stop   02/04/16 1500  vancomycin (VANCOCIN) IVPB 750 mg/150 ml premix     750 mg 150 mL/hr over 60 Minutes Intravenous Every 24 hours 02/03/16 2356     02/04/16 0600  aztreonam (AZACTAM) 1 g in dextrose 5 % 50 mL IVPB     1 g 100 mL/hr over 30 Minutes Intravenous Every 8 hours 02/03/16 2356     02/04/16 0600  metroNIDAZOLE (FLAGYL) IVPB 500 mg     500 mg 100 mL/hr over 60 Minutes Intravenous Every 8 hours 02/03/16 2356     02/03/16 2100  vancomycin (VANCOCIN) IVPB 1000 mg/200 mL premix     1,000 mg 200 mL/hr over 60 Minutes Intravenous  Once 02/03/16 2047 02/03/16 2239   02/03/16 2100  aztreonam (AZACTAM) 2 g in dextrose 5 % 50 mL IVPB     2 g 100 mL/hr over 30 Minutes Intravenous  Once 02/03/16 2050 02/03/16 2309    02/03/16 2100  metroNIDAZOLE (FLAGYL) IVPB 500 mg     500 mg 100 mL/hr over 60 Minutes Intravenous  Once 02/03/16 2050 02/03/16 2239      Medications   Scheduled Meds: . apixaban  5 mg Oral 1 day or 1 dose  . aztreonam  1 g Intravenous Q8H  . digoxin  0.125 mg Oral Daily  . diltiazem  60 mg Oral Q8H  . furosemide  20 mg Oral BID  . insulin aspart  0-5 Units Subcutaneous QHS  . insulin aspart  0-9 Units Subcutaneous TID WC  . metoprolol succinate  50 mg Oral BID  . metronidazole  500 mg Intravenous Q8H  . sodium chloride flush  3 mL Intravenous Q12H  . vancomycin  750 mg Intravenous Q24H   Continuous Infusions:  PRN Meds:.acetaminophen **OR** acetaminophen, ondansetron **OR** ondansetron (ZOFRAN) IV, oxyCODONE   Data Review:   Micro Results Recent Results (from the past 240 hour(s))  Urine culture     Status: None (Preliminary result)   Collection Time: 02/03/16  9:14 PM  Result Value Ref Range Status   Specimen Description URINE, RANDOM  Final   Special Requests Normal  Final   Culture NO GROWTH < 12 HOURS  Final   Report Status PENDING  Incomplete  Wound culture     Status: None (Preliminary result)   Collection Time: 02/03/16  9:26 PM  Result Value Ref Range Status   Specimen Description WOUND  Final   Special Requests Normal  Final   Gram Stain PENDING  Incomplete   Culture TOO YOUNG TO READ  Final   Report Status PENDING  Incomplete    Radiology Reports Dg Chest Port 1 View  02/03/2016  CLINICAL DATA:  Facial swelling and erythema. Reese's. Sudden onset this evening. EXAM: PORTABLE CHEST 1 VIEW COMPARISON:  12/26/2014 FINDINGS: A single AP portable view  of the chest demonstrates no focal airspace consolidation or alveolar edema. The lungs are grossly clear. There is no large effusion or pneumothorax. There is unchanged cardiomegaly. Cardiac and mediastinal contours are otherwise unremarkable. IMPRESSION: Unchanged cardiomegaly.  No acute cardiopulmonary findings.  Electronically Signed   By: Ellery Plunk M.D.   On: 02/03/2016 22:21   Dg Foot Complete Right  01/22/2016  CLINICAL DATA:  Nonhealing wound over the second toe of the right foot; images were obtained with foot in bandages. The patient has known diabetes and peripheral vascular disease. EXAM: RIGHT FOOT COMPLETE - 3+ VIEW COMPARISON:  Right foot series of Feb 11, 2015. FINDINGS: The bones of the right foot are diffusely osteopenic which appears more severe overall on today's study than on the study of 11 months ago. No definite loss of cortical bone is observed in the second toe. The joint spaces are preserved. The other phalanges appear intact as well as do the metatarsals. The bones of the hindfoot are unremarkable. There is soft tissue swelling of the second toe. IMPRESSION: Findings compatible with cellulitis of the second toe. No objective evidence of osteomyelitis but there is severe diffuse osteopenia which limits sensitivity of the study. Electronically Signed   By: David  [place] M.D.   On: 01/22/2016 16:22     CBC  Recent Labs Lab 02/03/16 2003 02/04/16 0638  WBC 25.7* 24.7*  HGB 10.3* 10.1*  HCT 30.3* 30.3*  PLT 199 178  MCV 89.7 92.0  MCH 30.4 30.8  MCHC 33.9 33.5  RDW 17.2* 16.7*  LYMPHSABS 1.2  --   MONOABS 0.7  --   EOSABS 0.0  --   BASOSABS 0.1  --     Chemistries   Recent Labs Lab 02/03/16 2003 02/04/16 0638  NA 133* 138  K 3.8 3.6  CL 97* 103  CO2 31 28  GLUCOSE 172* 103*  BUN 37* 31*  CREATININE 1.03* 0.75  CALCIUM 8.5* 8.1*  AST 36  --   ALT 17  --   ALKPHOS 73  --   BILITOT 0.8  --    ------------------------------------------------------------------------------------------------------------------ estimated creatinine clearance is 40.5 mL/min (by C-G formula based on Cr of 0.75). ------------------------------------------------------------------------------------------------------------------ No results for input(s): HGBA1C in the last 72  hours. ------------------------------------------------------------------------------------------------------------------ No results for input(s): CHOL, HDL, LDLCALC, TRIG, CHOLHDL, LDLDIRECT in the last 72 hours. ------------------------------------------------------------------------------------------------------------------ No results for input(s): TSH, T4TOTAL, T3FREE, THYROIDAB in the last 72 hours.  Invalid input(s): FREET3 ------------------------------------------------------------------------------------------------------------------ No results for input(s): VITAMINB12, FOLATE, FERRITIN, TIBC, IRON, RETICCTPCT in the last 72 hours.  Coagulation profile  Recent Labs Lab 02/03/16 2003  INR 2.03    No results for input(s): DDIMER in the last 72 hours.  Cardiac Enzymes  Recent Labs Lab 02/03/16 2003 02/04/16 0023 02/04/16 0638  TROPONINI 0.38* 0.25* 0.17*   ------------------------------------------------------------------------------------------------------------------ Invalid input(s): POCBNP    Assessment & Plan  Patient is a 82 year old white female being admitted with cellulitis  1.  Right lower extremity Cellulitis  :  Patient with multiple allergies to antibiotics. Continue vancomycin and aztreonam and Flagyl. Wound care consult for chronic lower extremity wound  2.  Diabetic foot ulcer (HCC) - wound care consult  3. Paroxysmal atrial fibrillation with intermittent sinus tachycardia at this time I will start patient on Cardizem due to heart rate being under poor control continue Toprol XL Continue anticoagulation with the Eliquis  4. Diabetes type 2 continue sliding scale insulin blood glucose is stable  5. PVD (peripheral vascular disease) (HCC) -  continue home meds including anticoagulation  6.  HTN (hypertension) - continue therapy with Toprol   7. Chronic congestive heart failure type unknown discontinue IV fluids patient is not dehydrated  continue oral Lasix  8. Elevated troponin due to demand ischemia  9. DVT prophylaxis patient RD on therapy with Eliquis    Code Status Orders        Start     Ordered   02/04/16 0029  Do not attempt resuscitation (DNR)   Continuous    Question Answer Comment  In the event of cardiac or respiratory ARREST Do not call a "code blue"   In the event of cardiac or respiratory ARREST Do not perform Intubation, CPR, defibrillation or ACLS   In the event of cardiac or respiratory ARREST Use medication by any route, position, wound care, and other measures to relive pain and suffering. May use oxygen, suction and manual treatment of airway obstruction as needed for comfort.      02/04/16 0028    Code Status History    Date Active Date Inactive Code Status Order ID Comments User Context   02/04/2016 12:17 AM 02/04/2016 12:28 AM Full Code [id number]  Oralia Manisavid Willis, MD Inpatient   07/15/2015  4:09 PM 07/15/2015  9:52 PM Full Code [id number]  Renford DillsGregory G Schnier, MD Inpatient   04/08/2015  9:38 AM 04/08/2015  3:24 PM Full Code [id number]  Renford DillsGregory G Schnier, MD Inpatient    Advance Directive Documentation        Most Recent Value   Type of Advance Directive  Out of facility DNR (pink MOST or yellow form)   Pre-existing out of facility DNR order (yellow form or pink MOST form)  Physician notified to receive inpatient order   "MOST" Form in Place?             Consults  Wound care nurse DVT Prophylaxis  Eliquis  Lab Results  Component Value Date   PLT 178 02/04/2016     Time Spent in minutes   35min  Greater than 50% of time spent in care coordination and counseling patient regarding the condition and plan of care.   Auburn BilberryPATEL, Dannon Nguyenthi M.D on 02/04/2016 at 10:26 AM  Between 7am to 6pm - Pager - [phone]  After 6pm go to [email or web address] - password [email or web address] [email or web address]  [facility]   Office  [phone]

## 2016-02-04 NOTE — Plan of Care (Signed)
Problem: Skin Integrity: Goal: Skin integrity will improve Outcome: Progressing Wound consult placed this AM. Awaiting consultation.

## 2016-02-04 NOTE — Consult Note (Signed)
WOC wound consult note Reason for Consult: Cellulitis to right posterior lower leg.  [facility]  Not on formulary here in house and patient's daughter reports she does not have any.  Will implement antimicrobial absorbent hydrofiber due to serous weeping.  Wound type:Infectious  Began as blistering, per daughter.  Pressure Ulcer POA: N/A Measurement:3 lesions 2 cm x 1 cm x 0.2 cm ruddy red Wound ZOX:WRUEAbed:ruddy red with some fibrin slough present.  Drainage (amount, consistency, odor) Minimal serous weeping.  Periwound:Erythema and edema.  Very tender to touch.  Dressing procedure/placement/frequency:Cleanse wounds to right posterior leg with soap and water.  Apply Aquacel Ag to wound bed. Cover with 4x4 gauze and kerlix/tape.  Change Mon/Wed/Fri Will not follow at this time.  Please re-consult if needed.  Maple HudsonKaren Aleria Maheu RN BSN CWON Pager [phone]

## 2016-02-04 NOTE — Care Management (Signed)
Patient presents with cellulitis of right leg.  She had AKA on the left in 2016.  Patient lies with her son and daughter in law and says she uwses a wheelchair to get around."  Says that someone comes to the house to wrap her leg.  She does not know the name of the agency.  Have ruled out [facility].

## 2016-02-05 ENCOUNTER — Ambulatory Visit: Payer: Medicare Other | Admitting: Surgery

## 2016-02-05 DIAGNOSIS — I10 Essential (primary) hypertension: Secondary | ICD-10-CM | POA: Diagnosis not present

## 2016-02-05 LAB — URINE CULTURE
CULTURE: NO GROWTH
SPECIAL REQUESTS: NORMAL

## 2016-02-05 LAB — GLUCOSE, CAPILLARY
GLUCOSE-CAPILLARY: 165 mg/dL — AB (ref 65–99)
Glucose-Capillary: 106 mg/dL — ABNORMAL HIGH (ref 65–99)
Glucose-Capillary: 180 mg/dL — ABNORMAL HIGH (ref 65–99)
Glucose-Capillary: 112 mg/dL — ABNORMAL HIGH (ref 65–99)

## 2016-02-05 LAB — MAGNESIUM: Magnesium: 1.9 mg/dL (ref 1.7–2.4)

## 2016-02-05 LAB — BRAIN NATRIURETIC PEPTIDE: B Natriuretic Peptide: 587 pg/mL — ABNORMAL HIGH (ref 0.0–100.0)

## 2016-02-05 NOTE — Care Management (Signed)
Found that [facility].  Vascular consult is pending.  At present, there is no indication of need for long term IV antibiotics

## 2016-02-05 NOTE — Consult Note (Signed)
[facility] Vascular Consult Note  MRN : [id number]  [name] is a 82 y.o. (09/25/1921) female who presents with chief complaint of  Chief Complaint  Patient presents with  . Wound Infection  . Allergic Reaction   History of Present Illness:  Patient well know to our practice. We have been treating her for PAD and lymphedema. Asked to consult by primary team, Dr. Allena Katz for ulcer recommendations.   Patient has chronic right lower extremity ulcerations located primarily on the calf. Recently, she has been treated at the wound center by Dr. Meyer Russel with a "antifungal pulse something else" cream and honey patches. Daughter who is at bedside states slow improvement in the wounds.   Patient states an overall discomfort with the right lower extremity which seems to be slowly becoming more bothersome. Patient doesn't ambulate much but when she does there is discomfort.   Patient has been notoriously non-compliant with wearing compression stockings and elevating her leg in regard to controlling her lymphedema. She has been admitted for a lymphedema exacerbation with cellulitis of the right lower extremity. Patient is already refusing unna boots.   Patient has a PMHx of PAD and is s/p BKA.  Current Facility-Administered Medications  Medication Dose Route Frequency Provider Last Rate Last Dose  . acetaminophen (TYLENOL) tablet 650 mg  650 mg Oral Q6H PRN Oralia Manis, MD   650 mg at 02/04/16 0348   Or  . acetaminophen (TYLENOL) suppository 650 mg  650 mg Rectal Q6H PRN Oralia Manis, MD      . apixaban Everlene Balls) tablet 5 mg  5 mg Oral BID Oralia Manis, MD   5 mg at 02/05/16 1003  . aztreonam (AZACTAM) 1 g in dextrose 5 % 50 mL IVPB  1 g Intravenous Q8H Oralia Manis, MD   1 g at 02/05/16 1423  . digoxin (LANOXIN) tablet 0.125 mg  0.125 mg Oral Daily Oralia Manis, MD   0.125 mg at 02/05/16 1003  . diltiazem (CARDIZEM) tablet 60 mg  60 mg Oral Q8H Auburn Bilberry, MD   60  mg at 02/05/16 1423  . docusate sodium (COLACE) capsule 200 mg  200 mg Oral BID Auburn Bilberry, MD   200 mg at 02/05/16 1004  . furosemide (LASIX) tablet 20 mg  20 mg Oral BID Oralia Manis, MD   20 mg at 02/05/16 1703  . insulin aspart (novoLOG) injection 0-5 Units  0-5 Units Subcutaneous QHS Oralia Manis, MD   0 Units at 02/04/16 0128  . insulin aspart (novoLOG) injection 0-9 Units  0-9 Units Subcutaneous TID WC Oralia Manis, MD   2 Units at 02/05/16 1704  . metoprolol succinate (TOPROL-XL) 24 hr tablet 50 mg  50 mg Oral BID Oralia Manis, MD   50 mg at 02/05/16 1003  . metroNIDAZOLE (FLAGYL) IVPB 500 mg  500 mg Intravenous Q8H Oralia Manis, MD   500 mg at 02/05/16 1427  . ondansetron (ZOFRAN) tablet 4 mg  4 mg Oral Q6H PRN Oralia Manis, MD       Or  . ondansetron [facility]) injection 4 mg  4 mg Intravenous Q6H PRN Oralia Manis, MD      . oxyCODONE (Oxy IR/ROXICODONE) immediate release tablet 5 mg  5 mg Oral Q4H PRN Oralia Manis, MD   5 mg at 02/05/16 1423  . sodium chloride flush (NS) 0.9 % injection 3 mL  3 mL Intravenous Q12H Oralia Manis, MD   3 mL at 02/05/16 1004  . vancomycin (VANCOCIN)  IVPB 750 mg/150 ml premix  750 mg Intravenous Q24H Oralia Manis, MD   750 mg at 02/05/16 1513    Past Medical History  Diagnosis Date  . Peripheral vascular disease (HCC)   . Dysrhythmia   . Diabetes mellitus without complication (HCC)   . Hypertension   . CHF (congestive heart failure) (HCC)   . Atrial fibrillation (HCC)   . Gout   . Foot ulcer, right (HCC)   . Gangrene of toe [facility])    Past Surgical History  Procedure Laterality Date  . Leg amputation through knee Left   . Peripheral vascular catheterization Right 04/08/2015    Procedure: Lower Extremity Angiography;  Surgeon: Renford Dills, MD;  Location: [facility] INVASIVE CV LAB;  Service: Cardiovascular;  Laterality: Right;  . Peripheral vascular catheterization  04/08/2015    Procedure: Lower Extremity Intervention;  Surgeon: Renford Dills, MD;  Location: [facility] INVASIVE CV LAB;  Service: Cardiovascular;;  . Joint replacement    . Right knee replacement    . Eye surgery    . Peripheral vascular catheterization N/A 07/15/2015    Procedure: Abdominal Aortogram w/Lower Extremity;  Surgeon: Renford Dills, MD;  Location: [facility] INVASIVE CV LAB;  Service: Cardiovascular;  Laterality: N/A;  . Peripheral vascular catheterization  07/15/2015    Procedure: Lower Extremity Intervention;  Surgeon: Renford Dills, MD;  Location: [facility] INVASIVE CV LAB;  Service: Cardiovascular;;   Social History Social History  Substance Use Topics  . Smoking status: Former Smoker    Quit date: 12/02/1954  . Smokeless tobacco: None  . Alcohol Use: No   Family History History reviewed. No pertinent family history.  Patient denies a family history of clotting disorders, PAD or renal disease.   Allergies  Allergen Reactions  . Amoxicillin   . Cephalexin   . Prednisolone   . Prednisone   . Toradol [Ketorolac Tromethamine]   . Tramadol    REVIEW OF SYSTEMS (Negative unless checked)  Constitutional: [] Weight loss  [] Fever  [] Chills Cardiac: [] Chest pain   [] Chest pressure   [] Palpitations   [] Shortness of breath when laying flat   [] Shortness of breath at rest   [] Shortness of breath with exertion. Vascular:  [x] Pain in legs with walking   [x] Pain in legs at rest   [x] Pain in legs when laying flat   [x] Claudication   [x] Pain in feet when walking  [x] Pain in feet at rest  [x] Pain in feet when laying flat   [] History of DVT   [] Phlebitis   [x] Swelling in legs   [] Varicose veins   [x] Non-healing ulcers Pulmonary:   [] Uses home oxygen   [] Productive cough   [] Hemoptysis   [] Wheeze  [] COPD   [] Asthma Neurologic:  [] Dizziness  [] Blackouts   [] Seizures   [] History of stroke   [] History of TIA  [] Aphasia   [] Temporary blindness   [] Dysphagia   [] Weakness or numbness in arms   [] Weakness or numbness in legs Musculoskeletal:  [] Arthritis   [] Joint  swelling   [] Joint pain   [] Low back pain Hematologic:  [] Easy bruising  [] Easy bleeding   [] Hypercoagulable state   [] Anemic  [] Hepatitis Gastrointestinal:  [] Blood in stool   [] Vomiting blood  [] Gastroesophageal reflux/heartburn   [] Difficulty swallowing. Genitourinary:  [] Chronic kidney disease   [] Difficult urination  [] Frequent urination  [] Burning with urination   [] Blood in urine Skin:  [] Rashes   [x] Ulcers   [x] Wounds Psychological:  [] History of anxiety   []  History of major depression.  Physical  Examination  Filed Vitals:   02/05/16 0532 02/05/16 1001 02/05/16 1003 02/05/16 1341  BP: 119/64 133/53  115/65  Pulse: 87  92 80  Temp: 97.8 F (36.6 C)   98 F (36.7 C)  TempSrc: Oral   Oral  Resp: 18     Height:      Weight:      SpO2: 94%   96%   Body mass index is 25.16 kg/(m^2).  Gen:  WD/WN, NAD Head: [place]/AT, No temporalis wasting. Prominent temp pulse not noted. Ear/Nose/Throat: Hearing grossly intact, nares w/o erythema or drainage, oropharynx w/o Erythema/Exudate Eyes: PERRLA, EOMI.  Neck: Supple, no nuchal rigidity.  No bruit or JVD.  Pulmonary:  Good air movement, clear to auscultation bilaterally.  Cardiac: RRR, normal S1, S2, no Murmurs, rubs or gallops. Vascular:  Vessel Right Left  Radial Palpable Palpable  Ulnar Palpable Palpable  Brachial Palpable Palpable  Carotid Palpable, without bruit Palpable, without bruit  Aorta Not palpable N/A  Femoral Palpable Palpable  Popliteal Palpable BKA  PT Non-Palpable BKA  DP Non-Palpable BKA   Right Lower Extremity: Moderate edema from knee distally. Three ulcerations noted with some fibrin slough present. Minimal serous weeping.   Gastrointestinal: soft, non-tender/non-distended. No guarding/reflex. No masses, surgical incisions, or scars. Musculoskeletal: M/S 5/5 throughout.  Extremities without ischemic changes.  No deformity or atrophy. No edema. Neurologic: CN 2-12 intact. Pain and light touch intact in  extremities.  Symmetrical.  Speech is fluent. Motor exam as listed above. Psychiatric: Judgment intact, Mood & affect appropriate for pt's clinical situation. Dermatologic: No rashes or ulcers noted.  No cellulitis or open wounds. Lymph : No Cervical, Axillary, or Inguinal lymphadenopathy.  CBC Lab Results  Component Value Date   WBC 24.7* 02/04/2016   HGB 10.1* 02/04/2016   HCT 30.3* 02/04/2016   MCV 92.0 02/04/2016   PLT 178 02/04/2016   BMET    Component Value Date/Time   NA 138 02/04/2016 0638   NA 138 01/05/2015 0607   K 3.6 02/04/2016 0638   K 4.0 01/05/2015 0607   CL 103 02/04/2016 0638   CL 103 01/05/2015 0607   CO2 28 02/04/2016 0638   CO2 31 01/05/2015 0607   GLUCOSE 103* 02/04/2016 0638   GLUCOSE 104* 01/05/2015 0607   BUN 31* 02/04/2016 0638   BUN 15 01/05/2015 0607   CREATININE 0.75 02/04/2016 0638   CREATININE 0.63 01/05/2015 0607   CALCIUM 8.1* 02/04/2016 0638   CALCIUM 8.0* 01/05/2015 0607   GFRNONAA >60 02/04/2016 0638   GFRNONAA >60 01/05/2015 0607   GFRNONAA >60 10/31/2014 0326   GFRAA >60 02/04/2016 0638   GFRAA >60 01/05/2015 0607   GFRAA >60 10/31/2014 0326   Estimated Creatinine Clearance: 40.5 mL/min (by C-G formula based on Cr of 0.75).  COAG Lab Results  Component Value Date   INR 2.03 02/03/2016   INR 1.3 12/26/2014   INR 1.3 10/25/2014   Radiology Dg Chest Port 1 View  02/03/2016  CLINICAL DATA:  Facial swelling and erythema. Reese's. Sudden onset this evening. EXAM: PORTABLE CHEST 1 VIEW COMPARISON:  12/26/2014 FINDINGS: A single AP portable view of the chest demonstrates no focal airspace consolidation or alveolar edema. The lungs are grossly clear. There is no large effusion or pneumothorax. There is unchanged cardiomegaly. Cardiac and mediastinal contours are otherwise unremarkable. IMPRESSION: Unchanged cardiomegaly.  No acute cardiopulmonary findings. Electronically Signed   By: Ellery Plunkaniel R Mitchell M.D.   On: 02/03/2016 22:21   Dg  Foot Complete Right  01/22/2016  CLINICAL DATA:  Nonhealing wound over the second toe of the right foot; images were obtained with foot in bandages. The patient has known diabetes and peripheral vascular disease. EXAM: RIGHT FOOT COMPLETE - 3+ VIEW COMPARISON:  Right foot series of Feb 11, 2015. FINDINGS: The bones of the right foot are diffusely osteopenic which appears more severe overall on today's study than on the study of 11 months ago. No definite loss of cortical bone is observed in the second toe. The joint spaces are preserved. The other phalanges appear intact as well as do the metatarsals. The bones of the hindfoot are unremarkable. There is soft tissue swelling of the second toe. IMPRESSION: Findings compatible with cellulitis of the second toe. No objective evidence of osteomyelitis but there is severe diffuse osteopenia which limits sensitivity of the study. Electronically Signed   By: David  [place]az[place] M.D.   On: 01/22/2016 16:22   Assessment/Plan 82 year old female well know to our practice with a PMHx of PAD and Lymphedema. Admitted with right lower extremity lymphedema exacerbation and cellulitis.  1) With the patients PMHx of PAD s/p LLE BKA, increasing pain in the RLE with ulcer formation recommend a right lower extremity angiogram in an attempt to revascularize the extremity. 2) This will happen either Monday or Tuesday with Dr. Wyn Quaker 3) Procedure, risks and benefits discussed with patient. All questions answered. Patient and daughter who is at bedside wish to proceed, 4) Continue local wound care. 5) Continue IV ABX 6) Discussed with Dr. Weldon Inches, PA-C  02/05/2016 5:26 PM

## 2016-02-05 NOTE — Progress Notes (Signed)
Reported to Dr Amado CoeGouru that patient had 2.26 pause on heart monitor. MD acknowledged, ordered EKG, serum magnesium and BNP

## 2016-02-05 NOTE — Progress Notes (Signed)
[facility] - [place] at [facility]                                                                                                                                                                                            Patient Demographics   [name], is a 82 y.o. female, DOB - [birth year], BJY:[id number]  Admit date - 02/03/2016   Admitting Physician Oralia Manis, MD  Outpatient Primary MD for the patient is DAVID BRONSTEIN, MD   LOS - 2  Subjective:patient erythema extending highter up to leg,  And thigh denies any cp or sob     Review of Systems:   CONSTITUTIONAL: Positive fever. No fatigue, weakness. No weight gain, no weight loss.  EYES: No blurry or double vision.  ENT: No tinnitus. No postnasal drip. No redness of the oropharynx.  RESPIRATORY: No cough, no wheeze, no hemoptysis. No dyspnea.  CARDIOVASCULAR: No chest pain. No orthopnea. No palpitations. No syncope.  GASTROINTESTINAL: No nausea, no vomiting or diarrhea. No abdominal pain. No melena or hematochezia.  GENITOURINARY: No dysuria or hematuria.  ENDOCRINE: No polyuria or nocturia. No heat or cold intolerance.  HEMATOLOGY: No anemia. No bruising. No bleeding.  INTEGUMENTARY: Right leg swelling and redness pain.  MUSCULOSKELETAL: No arthritis. No swelling. No gout.  NEUROLOGIC: No numbness, tingling, or ataxia. No seizure-type activity.  PSYCHIATRIC: No anxiety. No insomnia. No ADD.    Vitals:   Filed Vitals:   02/04/16 1932 02/05/16 0532 02/05/16 1001 02/05/16 1003  BP: 111/43 119/64 133/53   Pulse: 91 87  92  Temp: 98.2 F (36.8 C) 97.8 F (36.6 C)    TempSrc: Oral Oral    Resp: 20 18    Height:      Weight:      SpO2: 97% 94%      Wt Readings from Last 3 Encounters:  02/03/16 64.4 kg (141 lb 15.6 oz)  07/15/15 64.411 kg (142 lb)  04/08/15 64.864 kg (143 lb)     Intake/Output Summary (Last 24 hours) at 02/05/16 1143 Last data filed at 02/05/16 0949  Gross per 24  hour  Intake    840 ml  Output    750 ml  Net     90 ml    Physical Exam:   GENERAL: Pleasant-appearing in no apparent distress.  HEAD, EYES, EARS, NOSE AND THROAT: Atraumatic, normocephalic. Extraocular muscles are intact. Pupils equal and reactive to light. Sclerae anicteric. No conjunctival injection. No oro-pharyngeal erythema.  NECK: Supple. There is no jugular venous distention. No bruits, no lymphadenopathy, no thyromegaly.  HEART: Regular rate and rhythm,. No murmurs,  no rubs, no clicks.  LUNGS: Clear to auscultation bilaterally. No rales or rhonchi. No wheezes.  ABDOMEN: Soft, flat, nontender, nondistended. Has good bowel sounds. No hepatosplenomegaly appreciated.  EXTREMITIES: Left BKA , right foot with erythema extending up higher to the leg dressing in place  Now there erythema is in thigh as well NEUROLOGIC: The patient is alert, awake, and oriented x3 with no focal motor or sensory deficits appreciated bilaterally.  SKIN: Moist and warm with no rashes appreciated.  Psych: Not anxious, depressed LN: No inguinal LN enlargement    Antibiotics   Anti-infectives    Start     Dose/Rate Route Frequency Ordered Stop   02/04/16 1500  vancomycin (VANCOCIN) IVPB 750 mg/150 ml premix     750 mg 150 mL/hr over 60 Minutes Intravenous Every 24 hours 02/03/16 2356     02/04/16 0600  aztreonam (AZACTAM) 1 g in dextrose 5 % 50 mL IVPB     1 g 100 mL/hr over 30 Minutes Intravenous Every 8 hours 02/03/16 2356     02/04/16 0600  metroNIDAZOLE (FLAGYL) IVPB 500 mg     500 mg 100 mL/hr over 60 Minutes Intravenous Every 8 hours 02/03/16 2356     02/03/16 2100  vancomycin (VANCOCIN) IVPB 1000 mg/200 mL premix     1,000 mg 200 mL/hr over 60 Minutes Intravenous  Once 02/03/16 2047 02/03/16 2239   02/03/16 2100  aztreonam (AZACTAM) 2 g in dextrose 5 % 50 mL IVPB     2 g 100 mL/hr over 30 Minutes Intravenous  Once 02/03/16 2050 02/03/16 2309   02/03/16 2100  metroNIDAZOLE (FLAGYL) IVPB 500  mg     500 mg 100 mL/hr over 60 Minutes Intravenous  Once 02/03/16 2050 02/03/16 2239      Medications   Scheduled Meds: . apixaban  5 mg Oral BID  . aztreonam  1 g Intravenous Q8H  . digoxin  0.125 mg Oral Daily  . diltiazem  60 mg Oral Q8H  . docusate sodium  200 mg Oral BID  . furosemide  20 mg Oral BID  . insulin aspart  0-5 Units Subcutaneous QHS  . insulin aspart  0-9 Units Subcutaneous TID WC  . metoprolol succinate  50 mg Oral BID  . metronidazole  500 mg Intravenous Q8H  . sodium chloride flush  3 mL Intravenous Q12H  . vancomycin  750 mg Intravenous Q24H   Continuous Infusions:  PRN Meds:.acetaminophen **OR** acetaminophen, ondansetron **OR** ondansetron (ZOFRAN) IV, oxyCODONE   Data Review:   Micro Results Recent Results (from the past 240 hour(s))  Urine culture     Status: None   Collection Time: 02/03/16  9:14 PM  Result Value Ref Range Status   Specimen Description URINE, RANDOM  Final   Special Requests Normal  Final   Culture NO GROWTH 2 DAYS  Final   Report Status 02/05/2016 FINAL  Final  Culture, blood (routine x 2)     Status: None (Preliminary result)   Collection Time: 02/03/16  9:15 PM  Result Value Ref Range Status   Specimen Description BLOOD LEFT HAND  Final   Special Requests BOTTLES DRAWN AEROBIC AND ANAEROBIC 10ML  Final   Culture NO GROWTH 2 DAYS  Final   Report Status PENDING  Incomplete  Wound culture     Status: None (Preliminary result)   Collection Time: 02/03/16  9:26 PM  Result Value Ref Range Status   Specimen Description WOUND  Final   Special Requests Normal  Final   Gram Stain   Final    NO WBC SEEN RARE GRAM POSITIVE COCCI FEW GRAM NEGATIVE RODS    Culture   Final    MULTIPLE ORGANISMS PRESENT, NONE PREDOMINANT TRYING TO ISOLATE MULTIPLE ORGANISMS    Report Status PENDING  Incomplete  Anaerobic culture     Status: None (Preliminary result)   Collection Time: 02/03/16  9:26 PM  Result Value Ref Range Status    Specimen Description LEG  Final   Special Requests Normal  Final   Culture   Final    NO ANAEROBES ISOLATED; CULTURE IN PROGRESS FOR 5 DAYS   Report Status PENDING  Incomplete    Radiology Reports Dg Chest Port 1 View  02/03/2016  CLINICAL DATA:  Facial swelling and erythema. Reese's. Sudden onset this evening. EXAM: PORTABLE CHEST 1 VIEW COMPARISON:  12/26/2014 FINDINGS: A single AP portable view of the chest demonstrates no focal airspace consolidation or alveolar edema. The lungs are grossly clear. There is no large effusion or pneumothorax. There is unchanged cardiomegaly. Cardiac and mediastinal contours are otherwise unremarkable. IMPRESSION: Unchanged cardiomegaly.  No acute cardiopulmonary findings. Electronically Signed   By: Ellery Plunk M.D.   On: 02/03/2016 22:21   Dg Foot Complete Right  01/22/2016  CLINICAL DATA:  Nonhealing wound over the second toe of the right foot; images were obtained with foot in bandages. The patient has known diabetes and peripheral vascular disease. EXAM: RIGHT FOOT COMPLETE - 3+ VIEW COMPARISON:  Right foot series of Feb 11, 2015. FINDINGS: The bones of the right foot are diffusely osteopenic which appears more severe overall on today's study than on the study of 11 months ago. No definite loss of cortical bone is observed in the second toe. The joint spaces are preserved. The other phalanges appear intact as well as do the metatarsals. The bones of the hindfoot are unremarkable. There is soft tissue swelling of the second toe. IMPRESSION: Findings compatible with cellulitis of the second toe. No objective evidence of osteomyelitis but there is severe diffuse osteopenia which limits sensitivity of the study. Electronically Signed   By: David  [place] M.D.   On: 01/22/2016 16:22     CBC  Recent Labs Lab 02/03/16 2003 02/04/16 0638  WBC 25.7* 24.7*  HGB 10.3* 10.1*  HCT 30.3* 30.3*  PLT 199 178  MCV 89.7 92.0  MCH 30.4 30.8  MCHC 33.9 33.5  RDW  17.2* 16.7*  LYMPHSABS 1.2  --   MONOABS 0.7  --   EOSABS 0.0  --   BASOSABS 0.1  --     Chemistries   Recent Labs Lab 02/03/16 2003 02/04/16 0638  NA 133* 138  K 3.8 3.6  CL 97* 103  CO2 31 28  GLUCOSE 172* 103*  BUN 37* 31*  CREATININE 1.03* 0.75  CALCIUM 8.5* 8.1*  AST 36  --   ALT 17  --   ALKPHOS 73  --   BILITOT 0.8  --    ------------------------------------------------------------------------------------------------------------------ estimated creatinine clearance is 40.5 mL/min (by C-G formula based on Cr of 0.75). ------------------------------------------------------------------------------------------------------------------ No results for input(s): HGBA1C in the last 72 hours. ------------------------------------------------------------------------------------------------------------------ No results for input(s): CHOL, HDL, LDLCALC, TRIG, CHOLHDL, LDLDIRECT in the last 72 hours. ------------------------------------------------------------------------------------------------------------------ No results for input(s): TSH, T4TOTAL, T3FREE, THYROIDAB in the last 72 hours.  Invalid input(s): FREET3 ------------------------------------------------------------------------------------------------------------------ No results for input(s): VITAMINB12, FOLATE, FERRITIN, TIBC, IRON, RETICCTPCT in the last 72 hours.  Coagulation profile  Recent Labs Lab 02/03/16 2003  INR 2.03    No results for input(s): DDIMER in the last 72 hours.  Cardiac Enzymes  Recent Labs Lab 02/03/16 2003 02/04/16 0023 02/04/16 0638  TROPONINI 0.38* 0.25* 0.17*   ------------------------------------------------------------------------------------------------------------------ Invalid input(s): POCBNP    Assessment & Plan  Patient is a 82 year old white female being admitted with cellulitis  1.  Right lower extremity Cellulitis  :  Patient with multiple allergies to  antibiotics. Continue vancomycin and aztreonam and Flagyl. Wound care consult for chronic lower extremity wound Failure to improve I will ask id to see   2.  Diabetic foot ulcer (HCC) - wound care consult  3. Paroxysmal atrial fibrillation with intermittent sinus tachycardia at this time I will start patient on C heart rate improved with Cardizem and metoprolol therapy Continue anticoagulation with the Eliquis  4. Diabetes type 2 continue sliding scale insulin blood glucose is little labile but overall stable continue sliding scale  5. PVD (peripheral vascular disease) (HCC) - continue home meds including anticoagulationvascular consult as abo  6.  HTN (hypertension) - continue therapy with Toprol   7. Chronic congestive heart failure type unknown  compensated continue oral Lasix  8. Elevated troponin due to demand ischemia  9. DVT prophylaxis patient RD on therapy with Eliquis    Code Status Orders        Start     Ordered   02/04/16 0029  Do not attempt resuscitation (DNR)   Continuous    Question Answer Comment  In the event of cardiac or respiratory ARREST Do not call a "code blue"   In the event of cardiac or respiratory ARREST Do not perform Intubation, CPR, defibrillation or ACLS   In the event of cardiac or respiratory ARREST Use medication by any route, position, wound care, and other measures to relive pain and suffering. May use oxygen, suction and manual treatment of airway obstruction as needed for comfort.      02/04/16 0028    Code Status History    Date Active Date Inactive Code Status Order ID Comments User Context   02/04/2016 12:17 AM 02/04/2016 12:28 AM Full Code [id number]  Oralia Manis, MD Inpatient   07/15/2015  4:09 PM 07/15/2015  9:52 PM Full Code [id number]  Renford Dills, MD Inpatient   04/08/2015  9:38 AM 04/08/2015  3:24 PM Full Code [id number]  Renford Dills, MD Inpatient    Advance Directive Documentation        Most Recent Value   Type  of Advance Directive  Out of facility DNR (pink MOST or yellow form)   Pre-existing out of facility DNR order (yellow form or pink MOST form)  Physician notified to receive inpatient order   "MOST" Form in Place?             Consults  Wound care nurse DVT Prophylaxis  Eliquis  Lab Results  Component Value Date   PLT 178 02/04/2016     Time Spent in minutes    Greater than 50% of time spent in care coordination and counseling patient regarding the condition and plan of care.   Auburn Bilberry M.D on 02/05/2016 at 11:43 AM  Between 7am to 6pm - Pager - [phone]  After 6pm go to [email or web address] - password [email or web address]  [facility]   Office  [phone]

## 2016-02-05 NOTE — Care Management Important Message (Signed)
Important Message  Patient Details  Name: Nolon NationsHelmi Tietje MRN: [id number] Date of Birth: 09/12/1921   Medicare Important Message Given:  Yes    Olegario MessierKathy A Nikkol Pai 02/05/2016, 10:53 AM

## 2016-02-06 LAB — GLUCOSE, CAPILLARY
GLUCOSE-CAPILLARY: 89 mg/dL (ref 65–99)
Glucose-Capillary: 114 mg/dL — ABNORMAL HIGH (ref 65–99)
Glucose-Capillary: 114 mg/dL — ABNORMAL HIGH (ref 65–99)
Glucose-Capillary: 138 mg/dL — ABNORMAL HIGH (ref 65–99)

## 2016-02-06 LAB — BASIC METABOLIC PANEL
Anion gap: 7 (ref 5–15)
BUN: 32 mg/dL — AB (ref 6–20)
CO2: 27 mmol/L (ref 22–32)
CREATININE: 0.77 mg/dL (ref 0.44–1.00)
Calcium: 8.5 mg/dL — ABNORMAL LOW (ref 8.9–10.3)
Chloride: 99 mmol/L — ABNORMAL LOW (ref 101–111)
Glucose, Bld: 89 mg/dL (ref 65–99)
POTASSIUM: 4 mmol/L (ref 3.5–5.1)
SODIUM: 133 mmol/L — AB (ref 135–145)

## 2016-02-06 LAB — CBC
HCT: 28.2 % — ABNORMAL LOW (ref 35.0–47.0)
HEMOGLOBIN: 9.6 g/dL — AB (ref 12.0–16.0)
MCH: 30.5 pg (ref 26.0–34.0)
MCHC: 33.8 g/dL (ref 32.0–36.0)
MCV: 90 fL (ref 80.0–100.0)
PLATELETS: 238 10*3/uL (ref 150–440)
RBC: 3.14 MIL/uL — AB (ref 3.80–5.20)
RDW: 16.7 % — ABNORMAL HIGH (ref 11.5–14.5)
WBC: 11.9 10*3/uL — ABNORMAL HIGH (ref 3.6–11.0)

## 2016-02-06 LAB — VANCOMYCIN, TROUGH: VANCOMYCIN TR: 9 ug/mL — AB (ref 10–20)

## 2016-02-06 LAB — DIGOXIN LEVEL: Digoxin Level: 1.2 ng/mL (ref 0.8–2.0)

## 2016-02-06 MED ORDER — VANCOMYCIN HCL IN DEXTROSE 750-5 MG/150ML-% IV SOLN
750.0000 mg | Freq: Two times a day (BID) | INTRAVENOUS | Status: DC
  Administered 2016-02-07 – 2016-02-08 (×3): 750 mg via INTRAVENOUS
  Filled 2016-02-06 (×3): qty 150

## 2016-02-06 NOTE — Progress Notes (Signed)
[facility] - [place] at [facility]                                                                                                                                                                                            Patient Demographics   [name], is a 81 y.o. female, DOB - October 13, 1920, ZOX:[id number]  Admit date - 02/03/2016   Admitting Physician Oralia Manis, MD  Outpatient Primary MD for the patient is DAVID Terance Hart, MD   LOS - 3  Subjective: Some improvement in the cellulitis. Patient noticed to have 2.6 second pause yesterday. She is still not feeling well. Patient was seen by vascular that recommend angiogram Monday or Tuesday     Review of Systems:   CONSTITUTIONAL: Positive fever. No fatigue, weakness. No weight gain, no weight loss.  EYES: No blurry or double vision.  ENT: No tinnitus. No postnasal drip. No redness of the oropharynx.  RESPIRATORY: No cough, no wheeze, no hemoptysis. No dyspnea.  CARDIOVASCULAR: No chest pain. No orthopnea. No palpitations. No syncope.  GASTROINTESTINAL: No nausea, no vomiting or diarrhea. No abdominal pain. No melena or hematochezia.  GENITOURINARY: No dysuria or hematuria.  ENDOCRINE: No polyuria or nocturia. No heat or cold intolerance.  HEMATOLOGY: No anemia. No bruising. No bleeding.  INTEGUMENTARY: Right leg swelling and redness pain.  MUSCULOSKELETAL: No arthritis. No swelling. No gout.  NEUROLOGIC: No numbness, tingling, or ataxia. No seizure-type activity.  PSYCHIATRIC: No anxiety. No insomnia. No ADD.    Vitals:   Filed Vitals:   02/06/16 0000 02/06/16 0444 02/06/16 0922 02/06/16 0927  BP:  116/59 100/37   Pulse: 69 79 65 65  Temp:  97.8 F (36.6 C)    TempSrc:  Oral    Resp:  18    Height:      Weight:      SpO2:  95%      Wt Readings from Last 3 Encounters:  02/03/16 64.4 kg (141 lb 15.6 oz)  07/15/15 64.411 kg (142 lb)  04/08/15 64.864 kg (143 lb)     Intake/Output  Summary (Last 24 hours) at 02/06/16 1101 Last data filed at 02/06/16 0900  Gross per 24 hour  Intake    480 ml  Output      0 ml  Net    480 ml    Physical Exam:   GENERAL: Pleasant-appearing in no apparent distress.  HEAD, EYES, EARS, NOSE AND THROAT: Atraumatic, normocephalic. Extraocular muscles are intact. Pupils equal and reactive to light. Sclerae anicteric. No conjunctival injection. No oro-pharyngeal erythema.  NECK: Supple. There is no jugular venous distention.  No bruits, no lymphadenopathy, no thyromegaly.  HEART: Regular rate and rhythm,. No murmurs, no rubs, no clicks.  LUNGS: Clear to auscultation bilaterally. No rales or rhonchi. No wheezes.  ABDOMEN: Soft, flat, nontender, nondistended. Has good bowel sounds. No hepatosplenomegaly appreciated.  EXTREMITIES: Left BKA , right foot with erythema extending up to the knee some improvement dressing in place  Now there erythema is in thigh as well NEUROLOGIC: The patient is alert, awake, and oriented x3 with no focal motor or sensory deficits appreciated bilaterally.  SKIN: Moist and warm with no rashes appreciated.  Psych: Not anxious, depressed LN: No inguinal LN enlargement    Antibiotics   Anti-infectives    Start     Dose/Rate Route Frequency Ordered Stop   02/04/16 1500  vancomycin (VANCOCIN) IVPB 750 mg/150 ml premix     750 mg 150 mL/hr over 60 Minutes Intravenous Every 24 hours 02/03/16 2356     02/04/16 0600  aztreonam (AZACTAM) 1 g in dextrose 5 % 50 mL IVPB     1 g 100 mL/hr over 30 Minutes Intravenous Every 8 hours 02/03/16 2356     02/04/16 0600  metroNIDAZOLE (FLAGYL) IVPB 500 mg     500 mg 100 mL/hr over 60 Minutes Intravenous Every 8 hours 02/03/16 2356     02/03/16 2100  vancomycin (VANCOCIN) IVPB 1000 mg/200 mL premix     1,000 mg 200 mL/hr over 60 Minutes Intravenous  Once 02/03/16 2047 02/03/16 2239   02/03/16 2100  aztreonam (AZACTAM) 2 g in dextrose 5 % 50 mL IVPB     2 g 100 mL/hr over 30  Minutes Intravenous  Once 02/03/16 2050 02/03/16 2309   02/03/16 2100  metroNIDAZOLE (FLAGYL) IVPB 500 mg     500 mg 100 mL/hr over 60 Minutes Intravenous  Once 02/03/16 2050 02/03/16 2239      Medications   Scheduled Meds: . apixaban  5 mg Oral BID  . aztreonam  1 g Intravenous Q8H  . digoxin  0.125 mg Oral Daily  . diltiazem  60 mg Oral Q8H  . docusate sodium  200 mg Oral BID  . furosemide  20 mg Oral BID  . insulin aspart  0-5 Units Subcutaneous QHS  . insulin aspart  0-9 Units Subcutaneous TID WC  . metoprolol succinate  50 mg Oral BID  . metronidazole  500 mg Intravenous Q8H  . sodium chloride flush  3 mL Intravenous Q12H  . vancomycin  750 mg Intravenous Q24H   Continuous Infusions:  PRN Meds:.acetaminophen **OR** acetaminophen, ondansetron **OR** ondansetron (ZOFRAN) IV, oxyCODONE   Data Review:   Micro Results Recent Results (from the past 240 hour(s))  Urine culture     Status: None   Collection Time: 02/03/16  9:14 PM  Result Value Ref Range Status   Specimen Description URINE, RANDOM  Final   Special Requests Normal  Final   Culture NO GROWTH 2 DAYS  Final   Report Status 02/05/2016 FINAL  Final  Culture, blood (routine x 2)     Status: None (Preliminary result)   Collection Time: 02/03/16  9:15 PM  Result Value Ref Range Status   Specimen Description BLOOD LEFT HAND  Final   Special Requests BOTTLES DRAWN AEROBIC AND ANAEROBIC  Final   Culture NO GROWTH 2 DAYS  Final   Report Status PENDING  Incomplete  Wound culture     Status: None (Preliminary result)   Collection Time: 02/03/16  9:26 PM  Result Value  Ref Range Status   Specimen Description WOUND  Final   Special Requests Normal  Final   Gram Stain   Final    NO WBC SEEN RARE GRAM POSITIVE COCCI FEW GRAM NEGATIVE RODS    Culture   Final    MULTIPLE ORGANISMS PRESENT, NONE PREDOMINANT TRYING TO ISOLATE MULTIPLE ORGANISMS    Report Status PENDING  Incomplete  Anaerobic culture     Status:  None (Preliminary result)   Collection Time: 02/03/16  9:26 PM  Result Value Ref Range Status   Specimen Description LEG  Final   Special Requests Normal  Final   Culture   Final    NO ANAEROBES ISOLATED; CULTURE IN PROGRESS FOR 5 DAYS   Report Status PENDING  Incomplete    Radiology Reports Dg Chest Port 1 View  02/03/2016  CLINICAL DATA:  Facial swelling and erythema. Reese's. Sudden onset this evening. EXAM: PORTABLE CHEST 1 VIEW COMPARISON:  12/26/2014 FINDINGS: A single AP portable view of the chest demonstrates no focal airspace consolidation or alveolar edema. The lungs are grossly clear. There is no large effusion or pneumothorax. There is unchanged cardiomegaly. Cardiac and mediastinal contours are otherwise unremarkable. IMPRESSION: Unchanged cardiomegaly.  No acute cardiopulmonary findings. Electronically Signed   By: Ellery Plunk M.D.   On: 02/03/2016 22:21   Dg Foot Complete Right  01/22/2016  CLINICAL DATA:  Nonhealing wound over the second toe of the right foot; images were obtained with foot in bandages. The patient has known diabetes and peripheral vascular disease. EXAM: RIGHT FOOT COMPLETE - 3+ VIEW COMPARISON:  Right foot series of Feb 11, 2015. FINDINGS: The bones of the right foot are diffusely osteopenic which appears more severe overall on today's study than on the study of 11 months ago. No definite loss of cortical bone is observed in the second toe. The joint spaces are preserved. The other phalanges appear intact as well as do the metatarsals. The bones of the hindfoot are unremarkable. There is soft tissue swelling of the second toe. IMPRESSION: Findings compatible with cellulitis of the second toe. No objective evidence of osteomyelitis but there is severe diffuse osteopenia which limits sensitivity of the study. Electronically Signed   By: David  [place] M.D.   On: 01/22/2016 16:22     CBC  Recent Labs Lab 02/03/16 2003 02/04/16 0638 02/06/16 0406  WBC 25.7*  24.7* 11.9*  HGB 10.3* 10.1* 9.6*  HCT 30.3* 30.3* 28.2*  PLT 199 178 238  MCV 89.7 92.0 90.0  MCH 30.4 30.8 30.5  MCHC 33.9 33.5 33.8  RDW 17.2* 16.7* 16.7*  LYMPHSABS 1.2  --   --   MONOABS 0.7  --   --   EOSABS 0.0  --   --   BASOSABS 0.1  --   --     Chemistries   Recent Labs Lab 02/03/16 2003 02/04/16 0638 02/05/16 1923 02/06/16 0406  NA 133* 138  --  133*  K 3.8 3.6  --  4.0  CL 97* 103  --  99*  CO2 31 28  --  27  GLUCOSE 172* 103*  --  89  BUN 37* 31*  --  32*  CREATININE 1.03* 0.75  --  0.77  CALCIUM 8.5* 8.1*  --  8.5*  MG  --   --  1.9  --   AST 36  --   --   --   ALT 17  --   --   --  ALKPHOS 73  --   --   --   BILITOT 0.8  --   --   --    ------------------------------------------------------------------------------------------------------------------ estimated creatinine clearance is 40.5 mL/min (by C-G formula based on Cr of 0.77). ------------------------------------------------------------------------------------------------------------------ No results for input(s): HGBA1C in the last 72 hours. ------------------------------------------------------------------------------------------------------------------ No results for input(s): CHOL, HDL, LDLCALC, TRIG, CHOLHDL, LDLDIRECT in the last 72 hours. ------------------------------------------------------------------------------------------------------------------ No results for input(s): TSH, T4TOTAL, T3FREE, THYROIDAB in the last 72 hours.  Invalid input(s): FREET3 ------------------------------------------------------------------------------------------------------------------ No results for input(s): VITAMINB12, FOLATE, FERRITIN, TIBC, IRON, RETICCTPCT in the last 72 hours.  Coagulation profile  Recent Labs Lab 02/03/16 2003  INR 2.03    No results for input(s): DDIMER in the last 72 hours.  Cardiac Enzymes  Recent Labs Lab 02/03/16 2003 02/04/16 0023 02/04/16 0638  TROPONINI 0.38*  0.25* 0.17*   ------------------------------------------------------------------------------------------------------------------ Invalid input(s): POCBNP    Assessment & Plan  Patient is a 82 year old white female being admitted with cellulitis  1.  Right lower extremity Cellulitis  :  Patient with multiple allergies to antibiotics. Continue vancomycin and aztreonam and Flagyl. Wound care consult for chronic lower extremity wound WBC trending down continue current antibiotics Patient seen by vascular surgery she will have arteriogram on Monday or Tuesday   2.  Diabetic foot ulcer (HCC) - wound care consult  3. Paroxysmal atrial fibrillation with intermittent sinus tachycardia at this time I will start patient on patient had some sinus pauses yesterday. The Cardizem continue metoprolol and digoxin will check dig level Continue anticoagulation with the Eliquis   4. Diabetes type 2 continue sliding scale insulin  overall stable continue sliding scale   5. PVD (peripheral vascular disease) (HCC) - continue home meds including anticoagulationvascular consult as abo  6.  HTN (hypertension) - continue therapy with Toprol   7. Chronic congestive heart failure type unknown  compensated continue oral Lasix  8. Elevated troponin due to demand ischemia  9. DVT prophylaxis patient RD on therapy with Eliquis    Code Status Orders        Start     Ordered   02/04/16 0029  Do not attempt resuscitation (DNR)   Continuous    Question Answer Comment  In the event of cardiac or respiratory ARREST Do not call a "code blue"   In the event of cardiac or respiratory ARREST Do not perform Intubation, CPR, defibrillation or ACLS   In the event of cardiac or respiratory ARREST Use medication by any route, position, wound care, and other measures to relive pain and suffering. May use oxygen, suction and manual treatment of airway obstruction as needed for comfort.      02/04/16 0028    Code  Status History    Date Active Date Inactive Code Status Order ID Comments User Context   02/04/2016 12:17 AM 02/04/2016 12:28 AM Full Code [id number]  Oralia Manis, MD Inpatient   07/15/2015  4:09 PM 07/15/2015  9:52 PM Full Code [id number]  Renford Dills, MD Inpatient   04/08/2015  9:38 AM 04/08/2015  3:24 PM Full Code [id number]  Renford Dills, MD Inpatient    Advance Directive Documentation        Most Recent Value   Type of Advance Directive  Out of facility DNR (pink MOST or yellow form)   Pre-existing out of facility DNR order (yellow form or pink MOST form)  Physician notified to receive inpatient order   "MOST" Form in Place?  Consults  Wound care nurse DVT Prophylaxis  Eliquis  Lab Results  Component Value Date   PLT 238 02/06/2016     Time Spent in minutes   32min  Greater than 50% of time spent in care coordination and counseling patient regarding the condition and plan of care.   Auburn BilberryPATEL, Neliah Cuyler M.D on 02/06/2016 at 11:01 AM  Between 7am to 6pm - Pager - [phone]  After 6pm go to [email or web address] - password [email or web address] [facility]  [facility]   Office  [phone]

## 2016-02-06 NOTE — Progress Notes (Signed)
Pharmacy Antibiotic Note  [name] is a 82 y.o. female admitted on 02/03/2016 with cellulitis/diabetic foot with no evidence of osteo.  Pharmacy has been consulted for aztreonam, metronidazole, and vancomycin dosing.  Plan: 1. Aztreonam 2 gm IV x 1 in ED followed by 1 gm IV Q8H 2. Metronidazole 500 mg IV Q8H 3. Vancomycin 1 gm IV x 1 in ED followed in approximately 18 hours (stacked dosing) by vancomycin 750 mg IV Q24H, predicted trough 17 mcg/mL. Pharmacy will continue to follow and adjust as needed to maintain trough 15 to 20 mcg/mL.   5/12:  Vanc trough @ 14:23 = 9 mcg/mL. Will increase dose to Vancomycin 750 mg IV Q12H to start 5/13 @ 4:00. Will draw next trough before 3rd new dose on 5/14 @ 3:30.   Vd 39.8 L, Ke 0.03 hr-1, T1/2 22.8 hr  Height: 5' 2.99" (160 cm) Weight: 141 lb 15.6 oz (64.4 kg) IBW/kg (Calculated) : 52.38  Temp (24hrs), Avg:97.9 F (36.6 C), Min:97.6 F (36.4 C), Max:98.4 F (36.9 C)   Recent Labs Lab 02/03/16 2003 02/03/16 2135 02/04/16 0638 02/06/16 0406 02/06/16 1423  WBC 25.7*  --  24.7* 11.9*  --   CREATININE 1.03*  --  0.75 0.77  --   LATICACIDVEN  --  1.6  --   --   --   VANCOTROUGH  --   --   --   --  9*    Estimated Creatinine Clearance: 40.5 mL/min (by C-G formula based on Cr of 0.77).    Allergies  Allergen Reactions  . Amoxicillin   . Cephalexin   . Prednisolone   . Prednisone   . Toradol [Ketorolac Tromethamine]   . Tramadol     Thank you for allowing pharmacy to be a part of this patient's care.  Analissa Bayless D, Pharm.D Clinical Pharmacist 02/06/2016 6:14 PM

## 2016-02-07 LAB — BASIC METABOLIC PANEL
ANION GAP: 6 (ref 5–15)
BUN: 24 mg/dL — ABNORMAL HIGH (ref 6–20)
CHLORIDE: 101 mmol/L (ref 101–111)
CO2: 27 mmol/L (ref 22–32)
Calcium: 8.4 mg/dL — ABNORMAL LOW (ref 8.9–10.3)
Creatinine, Ser: 0.62 mg/dL (ref 0.44–1.00)
GFR calc Af Amer: >60 mL/min (ref >60)
Glucose, Bld: 116 mg/dL — ABNORMAL HIGH (ref 65–99)
POTASSIUM: 3.8 mmol/L (ref 3.5–5.1)
SODIUM: 134 mmol/L — AB (ref 135–145)

## 2016-02-07 LAB — ANAEROBIC CULTURE: Special Requests: NORMAL

## 2016-02-07 LAB — CBC
HCT: 28.2 % — ABNORMAL LOW (ref 35.0–47.0)
HEMOGLOBIN: 9.5 g/dL — AB (ref 12.0–16.0)
MCH: 30.7 pg (ref 26.0–34.0)
MCHC: 33.5 g/dL (ref 32.0–36.0)
MCV: 91.5 fL (ref 80.0–100.0)
PLATELETS: 274 10*3/uL (ref 150–440)
RBC: 3.08 MIL/uL — AB (ref 3.80–5.20)
RDW: 16.6 % — ABNORMAL HIGH (ref 11.5–14.5)
WBC: 12.3 10*3/uL — AB (ref 3.6–11.0)

## 2016-02-07 LAB — GLUCOSE, CAPILLARY
GLUCOSE-CAPILLARY: 98 mg/dL (ref 65–99)
GLUCOSE-CAPILLARY: 136 mg/dL — AB (ref 65–99)
GLUCOSE-CAPILLARY: 139 mg/dL — AB (ref 65–99)
Glucose-Capillary: 155 mg/dL — ABNORMAL HIGH (ref 65–99)

## 2016-02-07 LAB — WOUND CULTURE
Gram Stain: NONE SEEN
SPECIAL REQUESTS: NORMAL

## 2016-02-07 MED ORDER — MAGNESIUM HYDROXIDE 400 MG/5ML PO SUSP
30.0000 mL | Freq: Every day | ORAL | Status: DC | PRN
  Administered 2016-02-07: 13:00:00 30 mL via ORAL
  Filled 2016-02-07: qty 30

## 2016-02-07 MED ORDER — BISACODYL 5 MG PO TBEC
10.0000 mg | DELAYED_RELEASE_TABLET | Freq: Every day | ORAL | Status: DC | PRN
  Administered 2016-02-07 – 2016-02-10 (×2): 10 mg via ORAL
  Filled 2016-02-07 (×2): qty 2

## 2016-02-07 MED ORDER — OXYCODONE HCL 5 MG PO TABS
5.0000 mg | ORAL_TABLET | ORAL | Status: DC | PRN
  Administered 2016-02-08 – 2016-02-10 (×5): 10 mg via ORAL
  Administered 2016-02-10: 20:00:00 5 mg via ORAL
  Administered 2016-02-11 – 2016-02-13 (×5): 10 mg via ORAL
  Filled 2016-02-07: qty 1
  Filled 2016-02-07 (×2): qty 2
  Filled 2016-02-07 (×2): qty 1
  Filled 2016-02-07 (×7): qty 2

## 2016-02-07 NOTE — Progress Notes (Signed)
[facility] - [place] at [facility]                                                                                                                                                                                            Patient Demographics   [name], is a 82 y.o. female, DOB - Nov 10, 1920, ZOX:[id number]  Admit date - 02/03/2016   Admitting Physician Oralia Manis, MD  Outpatient Primary MD for the patient is DAVID Terance Hart, MD   LOS - 4  Subjective: improving  Cellulitis.afebrile.Decreased by mouth intake. No BM for 1 week.    Review of Systems:   CONSTITUTIONAL;no :Fever fatigue, weakness. No weight gain, no weight loss.  EYES: No blurry or double vision.  ENT: No tinnitus. No postnasal drip. No redness of the oropharynx.  RESPIRATORY: No cough, no wheeze, no hemoptysis. No dyspnea.  CARDIOVASCULAR: No chest pain. No orthopnea. No palpitations. No syncope.  GASTROINTESTINAL: No nausea, no vomiting or diarrhea. No abdominal pain. No melena or hematochezia.  GENITOURINARY: No dysuria or hematuria.  ENDOCRINE: No polyuria or nocturia. No heat or cold intolerance.  HEMATOLOGY: No anemia. No bruising. No bleeding.  INTEGUMENTARY: Right leg swelling and redness ,pain.  MUSCULOSKELETAL: No arthritis. No swelling. No gout.  NEUROLOGIC: No numbness, tingling, or ataxia. No seizure-type activity.  PSYCHIATRIC: No anxiety. No insomnia. No ADD.    Vitals:   Filed Vitals:   02/06/16 1300 02/06/16 2101 02/07/16 0514 02/07/16 0829  BP: 124/55 140/57 131/69 123/71  Pulse: 79 79 107 108  Temp: 97.6 F (36.4 C) 98.5 F (36.9 C) 98 F (36.7 C)   TempSrc: Oral Oral Oral   Resp:  16 16 20   Height:      Weight:      SpO2: 93% 95% 95% 93%    Wt Readings from Last 3 Encounters:  02/03/16 64.4 kg (141 lb 15.6 oz)  07/15/15 64.411 kg (142 lb)  04/08/15 64.864 kg (143 lb)     Intake/Output Summary (Last 24 hours) at 02/07/16 0957 Last data filed at  02/07/16 0944  Gross per 24 hour  Intake    123 ml  Output      0 ml  Net    123 ml    Physical Exam:   GENERAL: Pleasant-appearing in no apparent distress.  HEAD, EYES, EARS, NOSE AND THROAT: Atraumatic, normocephalic. Extraocular muscles are intact. Pupils equal and reactive to light. Sclerae anicteric. No conjunctival injection. No oro-pharyngeal erythema.  NECK: Supple. There is no jugular venous distention. No bruits, no lymphadenopathy, no thyromegaly.  HEART: Regular rate and rhythm,. No murmurs, no rubs,  no clicks.  LUNGS: Clear to auscultation bilaterally. No rales or rhonchi. No wheezes.  ABDOMEN: Soft, flat, nontender, nondistended. Has good bowel sounds. No hepatosplenomegaly appreciated.  EXTREMITIES: Left BKA , right foot with erythema extending up to the knee some improvement dressing in place  Now there erythema is in thigh as well NEUROLOGIC: The patient is alert, awake, and oriented x3 with no focal motor or sensory deficits appreciated bilaterally.  SKIN: Moist and warm with no rashes appreciated.  Psych: Not anxious, depressed LN: No inguinal LN enlargement    Antibiotics   Anti-infectives    Start     Dose/Rate Route Frequency Ordered Stop   02/07/16 0400  vancomycin (VANCOCIN) IVPB 750 mg/150 ml premix     750 mg 150 mL/hr over 60 Minutes Intravenous Every 12 hours 02/06/16 1813     02/04/16 1500  vancomycin (VANCOCIN) IVPB 750 mg/150 ml premix  Status:  Discontinued     750 mg 150 mL/hr over 60 Minutes Intravenous Every 24 hours 02/03/16 2356 02/06/16 1812   02/04/16 0600  aztreonam (AZACTAM) 1 g in dextrose 5 % 50 mL IVPB     1 g 100 mL/hr over 30 Minutes Intravenous Every 8 hours 02/03/16 2356     02/04/16 0600  metroNIDAZOLE (FLAGYL) IVPB 500 mg     500 mg 100 mL/hr over 60 Minutes Intravenous Every 8 hours 02/03/16 2356     02/03/16 2100  vancomycin (VANCOCIN) IVPB 1000 mg/200 mL premix     1,000 mg 200 mL/hr over 60 Minutes Intravenous  Once  02/03/16 2047 02/03/16 2239   02/03/16 2100  aztreonam (AZACTAM) 2 g in dextrose 5 % 50 mL IVPB     2 g 100 mL/hr over 30 Minutes Intravenous  Once 02/03/16 2050 02/03/16 2309   02/03/16 2100  metroNIDAZOLE (FLAGYL) IVPB 500 mg     500 mg 100 mL/hr over 60 Minutes Intravenous  Once 02/03/16 2050 02/03/16 2239      Medications   Scheduled Meds: . apixaban  5 mg Oral BID  . aztreonam  1 g Intravenous Q8H  . digoxin  0.125 mg Oral Daily  . docusate sodium  200 mg Oral BID  . furosemide  20 mg Oral BID  . insulin aspart  0-5 Units Subcutaneous QHS  . insulin aspart  0-9 Units Subcutaneous TID WC  . metoprolol succinate  50 mg Oral BID  . metronidazole  500 mg Intravenous Q8H  . sodium chloride flush  3 mL Intravenous Q12H  . vancomycin  750 mg Intravenous Q12H   Continuous Infusions:  PRN Meds:.acetaminophen **OR** acetaminophen, ondansetron **OR** ondansetron (ZOFRAN) IV, oxyCODONE   Data Review:   Micro Results Recent Results (from the past 240 hour(s))  Urine culture     Status: None   Collection Time: 02/03/16  9:14 PM  Result Value Ref Range Status   Specimen Description URINE, RANDOM  Final   Special Requests Normal  Final   Culture NO GROWTH 2 DAYS  Final   Report Status 02/05/2016 FINAL  Final  Culture, blood (routine x 2)     Status: None (Preliminary result)   Collection Time: 02/03/16  9:15 PM  Result Value Ref Range Status   Specimen Description BLOOD LEFT HAND  Final   Special Requests BOTTLES DRAWN AEROBIC AND ANAEROBIC  Final   Culture NO GROWTH 4 DAYS  Final   Report Status PENDING  Incomplete  Wound culture     Status: None  Collection Time: 02/03/16  9:26 PM  Result Value Ref Range Status   Specimen Description WOUND  Final   Special Requests Normal  Final   Gram Stain   Final    NO WBC SEEN RARE GRAM POSITIVE COCCI FEW GRAM NEGATIVE RODS    Culture   Final    MODERATE GROWTH PROTEUS MIRABILIS MODERATE GROWTH STAPHYLOCOCCUS  AUREUS MODERATE GROWTH ESCHERICHIA COLI    Report Status 02/07/2016 FINAL  Final   Organism ID, Bacteria ESCHERICHIA COLI  Final   Organism ID, Bacteria PROTEUS MIRABILIS  Final   Organism ID, Bacteria STAPHYLOCOCCUS AUREUS  Final      Susceptibility   Staphylococcus aureus - MIC*    CIPROFLOXACIN Value in next row Resistant      RESISTANT>=8    ERYTHROMYCIN Value in next row Resistant      RESISTANT>=8    GENTAMICIN Value in next row Sensitive      SENSITIVE<0.5    OXACILLIN Value in next row Sensitive      SENSITIVE0.5    TETRACYCLINE Value in next row Sensitive      SENSITIVE2    VANCOMYCIN Value in next row Sensitive      SENSITIVE1    TRIMETH/SULFA Value in next row Sensitive      SENSITIVE<=10    CLINDAMYCIN Value in next row Resistant      RESISTANT>=8    RIFAMPIN Value in next row Sensitive      SENSITIVE<=0.5    Inducible Clindamycin Value in next row Sensitive      SENSITIVE<=0.5    * MODERATE GROWTH STAPHYLOCOCCUS AUREUS   Escherichia coli - MIC*    AMPICILLIN Value in next row Resistant      SENSITIVE<=0.5    CEFAZOLIN Value in next row Sensitive      SENSITIVE<=0.5    CEFEPIME Value in next row Sensitive      SENSITIVE<=0.5    CEFTAZIDIME Value in next row Sensitive      SENSITIVE<=0.5    CEFTRIAXONE Value in next row Sensitive      SENSITIVE<=0.5    CIPROFLOXACIN Value in next row Resistant      SENSITIVE<=0.5    GENTAMICIN Value in next row Sensitive      SENSITIVE<=0.5    IMIPENEM Value in next row Sensitive      SENSITIVE<=0.5    TRIMETH/SULFA Value in next row Resistant      SENSITIVE<=0.5    AMPICILLIN/SULBACTAM Value in next row Intermediate      SENSITIVE<=0.5    PIP/TAZO Value in next row Sensitive      SENSITIVE<=0.5    Extended ESBL Value in next row Sensitive      SENSITIVE<=0.5    * MODERATE GROWTH ESCHERICHIA COLI   Proteus mirabilis - MIC*    AMPICILLIN Value in next row Resistant      RESISTANT>=32    CEFAZOLIN Value in next row  Sensitive      SENSITIVE<=4    CEFEPIME Value in next row Sensitive      SENSITIVE<=1    CEFTAZIDIME Value in next row Sensitive      SENSITIVE<=1    CEFTRIAXONE Value in next row Sensitive      SENSITIVE<=1    CIPROFLOXACIN Value in next row Sensitive      SENSITIVE1    GENTAMICIN Value in next row Resistant      RESISTANT>=16    IMIPENEM Value in next row Sensitive      SENSITIVE2  TRIMETH/SULFA Value in next row Resistant      RESISTANT>=320    AMPICILLIN/SULBACTAM Value in next row Sensitive      SENSITIVE8    PIP/TAZO Value in next row Sensitive      SENSITIVE<=4    * MODERATE GROWTH PROTEUS MIRABILIS  Anaerobic culture     Status: None   Collection Time: 02/03/16  9:26 PM  Result Value Ref Range Status   Specimen Description LEG  Final   Special Requests Normal  Final   Culture NO ANAEROBES ISOLATED  Final   Report Status 02/07/2016 FINAL  Final    Radiology Reports Dg Chest Port 1 View  02/03/2016  CLINICAL DATA:  Facial swelling and erythema. Reese's. Sudden onset this evening. EXAM: PORTABLE CHEST 1 VIEW COMPARISON:  12/26/2014 FINDINGS: A single AP portable view of the chest demonstrates no focal airspace consolidation or alveolar edema. The lungs are grossly clear. There is no large effusion or pneumothorax. There is unchanged cardiomegaly. Cardiac and mediastinal contours are otherwise unremarkable. IMPRESSION: Unchanged cardiomegaly.  No acute cardiopulmonary findings. Electronically Signed   By: Ellery Plunk M.D.   On: 02/03/2016 22:21   Dg Foot Complete Right  01/22/2016  CLINICAL DATA:  Nonhealing wound over the second toe of the right foot; images were obtained with foot in bandages. The patient has known diabetes and peripheral vascular disease. EXAM: RIGHT FOOT COMPLETE - 3+ VIEW COMPARISON:  Right foot series of Feb 11, 2015. FINDINGS: The bones of the right foot are diffusely osteopenic which appears more severe overall on today's study than on the study  of 11 months ago. No definite loss of cortical bone is observed in the second toe. The joint spaces are preserved. The other phalanges appear intact as well as do the metatarsals. The bones of the hindfoot are unremarkable. There is soft tissue swelling of the second toe. IMPRESSION: Findings compatible with cellulitis of the second toe. No objective evidence of osteomyelitis but there is severe diffuse osteopenia which limits sensitivity of the study. Electronically Signed   By: David  [place] M.D.   On: 01/22/2016 16:22     CBC  Recent Labs Lab 02/03/16 2003 02/04/16 0638 02/06/16 0406 02/07/16 0417  WBC 25.7* 24.7* 11.9* 12.3*  HGB 10.3* 10.1* 9.6* 9.5*  HCT 30.3* 30.3* 28.2* 28.2*  PLT 199 178 238 274  MCV 89.7 92.0 90.0 91.5  MCH 30.4 30.8 30.5 30.7  MCHC 33.9 33.5 33.8 33.5  RDW 17.2* 16.7* 16.7* 16.6*  LYMPHSABS 1.2  --   --   --   MONOABS 0.7  --   --   --   EOSABS 0.0  --   --   --   BASOSABS 0.1  --   --   --     Chemistries   Recent Labs Lab 02/03/16 2003 02/04/16 0638 02/05/16 1923 02/06/16 0406 02/07/16 0417  NA 133* 138  --  133* 134*  K 3.8 3.6  --  4.0 3.8  CL 97* 103  --  99* 101  CO2 31 28  --  27 27  GLUCOSE 172* 103*  --  89 116*  BUN 37* 31*  --  32* 24*  CREATININE 1.03* 0.75  --  0.77 0.62  CALCIUM 8.5* 8.1*  --  8.5* 8.4*  MG  --   --  1.9  --   --   AST 36  --   --   --   --   ALT 17  --   --   --   --  ALKPHOS 73  --   --   --   --   BILITOT 0.8  --   --   --   --    ------------------------------------------------------------------------------------------------------------------ estimated creatinine clearance is 40.5 mL/min (by C-G formula based on Cr of 0.62). ------------------------------------------------------------------------------------------------------------------ No results for input(s): HGBA1C in the last 72  hours. ------------------------------------------------------------------------------------------------------------------ No results for input(s): CHOL, HDL, LDLCALC, TRIG, CHOLHDL, LDLDIRECT in the last 72 hours. ------------------------------------------------------------------------------------------------------------------ No results for input(s): TSH, T4TOTAL, T3FREE, THYROIDAB in the last 72 hours.  Invalid input(s): FREET3 ------------------------------------------------------------------------------------------------------------------ No results for input(s): VITAMINB12, FOLATE, FERRITIN, TIBC, IRON, RETICCTPCT in the last 72 hours.  Coagulation profile  Recent Labs Lab 02/03/16 2003  INR 2.03    No results for input(s): DDIMER in the last 72 hours.  Cardiac Enzymes  Recent Labs Lab 02/03/16 2003 02/04/16 0023 02/04/16 0638  TROPONINI 0.38* 0.25* 0.17*   ------------------------------------------------------------------------------------------------------------------ Invalid input(s): POCBNP    Assessment & Plan  Patient is a 82 year old white female being admitted with cellulitis  1.  Right lower extremity Cellulitis  :  Patient with multiple allergies to antibiotics. Continue vancomycin and aztreonam and Flagyl. Wound care consult for chronic lower extremity wound WBC trending down continue current antibiotics Patient seen by vascular surgery she will have arteriogram on Monday or Tuesday Wound culture showed Proteus, Escherichia coli, staph aureus. Wbc is coming down 2.  Diabetic foot ulcer (HCC) - wound care consult  3. Paroxysmal atrial fibrillation ; continue digoxin, Toprol-XL 50 mg by mouth twice a day, and eliquis. 4. Diabetes type 2 continue sliding scale insulin  overall stable continue sliding scale   5. PVD (peripheral vascular disease) (HCC) - continue home meds including anticoagulationvascular consult as abo  6.  HTN (hypertension) -  continue therapy with Toprol   7. Chronic congestive heart failure type unknown  compensated continue oral Lasix  8. Elevated troponin due to demand ischemia  9. DVT prophylaxis patient RD on therapy with Eliquis #10: Constipation: Continue Colace, and milk of magnesia, Dulcolax suppositories as needed  D/w daughter     Code Status Orders        Start     Ordered   02/04/16 0029  Do not attempt resuscitation (DNR)   Continuous    Question Answer Comment  In the event of cardiac or respiratory ARREST Do not call a "code blue"   In the event of cardiac or respiratory ARREST Do not perform Intubation, CPR, defibrillation or ACLS   In the event of cardiac or respiratory ARREST Use medication by any route, position, wound care, and other measures to relive pain and suffering. May use oxygen, suction and manual treatment of airway obstruction as needed for comfort.      02/04/16 0028    Code Status History    Date Active Date Inactive Code Status Order ID Comments User Context   02/04/2016 12:17 AM 02/04/2016 12:28 AM Full Code [id number]  Oralia Manisavid Willis, MD Inpatient   07/15/2015  4:09 PM 07/15/2015  9:52 PM Full Code [id number]  Renford DillsGregory G Schnier, MD Inpatient   04/08/2015  9:38 AM 04/08/2015  3:24 PM Full Code [id number]  Renford DillsGregory G Schnier, MD Inpatient    Advance Directive Documentation        Most Recent Value   Type of Advance Directive  Out of facility DNR (pink MOST or yellow form)   Pre-existing out of facility DNR order (yellow form or pink MOST form)  Physician notified to receive inpatient order   "  MOST" Form in Place?             Consults  Wound care nurse DVT Prophylaxis  Eliquis  Lab Results  Component Value Date   PLT 274 02/07/2016     Time Spent in minutes    Greater than 50% of time spent in care coordination and counseling patient regarding the condition and plan of care.   Katha Hamming M.D on 02/07/2016 at 9:57 AM  Between 7am to 6pm - Pager  - [phone]  After 6pm go to [email or web address] - password [email or web address]  [facility] [place] [facility]   Office  [phone]

## 2016-02-08 LAB — CULTURE, BLOOD (ROUTINE X 2): CULTURE: NO GROWTH

## 2016-02-08 LAB — GLUCOSE, CAPILLARY
GLUCOSE-CAPILLARY: 111 mg/dL — AB (ref 65–99)
Glucose-Capillary: 110 mg/dL — ABNORMAL HIGH (ref 65–99)
Glucose-Capillary: 159 mg/dL — ABNORMAL HIGH (ref 65–99)
Glucose-Capillary: 124 mg/dL — ABNORMAL HIGH (ref 65–99)

## 2016-02-08 LAB — VANCOMYCIN, TROUGH: VANCOMYCIN TR: 14 ug/mL (ref 10–20)

## 2016-02-08 MED ORDER — SODIUM CHLORIDE 0.9 % IV SOLN
INTRAVENOUS | Status: DC
Start: 2016-02-09 — End: 2016-02-12
  Administered 2016-02-09 – 2016-02-11 (×4): via INTRAVENOUS

## 2016-02-08 MED ORDER — VANCOMYCIN HCL IN DEXTROSE 1-5 GM/200ML-% IV SOLN
1000.0000 mg | Freq: Two times a day (BID) | INTRAVENOUS | Status: DC
  Administered 2016-02-08 – 2016-02-10 (×5): 1000 mg via INTRAVENOUS
  Filled 2016-02-08 (×7): qty 200

## 2016-02-08 MED ORDER — METOPROLOL SUCCINATE ER 50 MG PO TB24
100.0000 mg | ORAL_TABLET | Freq: Two times a day (BID) | ORAL | Status: DC
  Administered 2016-02-08 – 2016-02-12 (×7): 100 mg via ORAL
  Filled 2016-02-08 (×8): qty 2

## 2016-02-08 NOTE — Plan of Care (Signed)
Problem: Pain Managment: Goal: General experience of comfort will improve Outcome: Not Progressing Pt c/o pain to right foot not being controlled with current PRN pain medication. Dr. Anne HahnWillis notified of patients c/o uncontrolled pain, new order entered by MD. PRN medication given per new order, noted improvement. Pt resting, no signs or symptoms of pain at present.

## 2016-02-08 NOTE — Progress Notes (Signed)
Pharmacy Antibiotic Note  [name] is a 82 y.o. female admitted on 02/03/2016 with cellulitis/diabetic foot with no evidence of osteo.  Pharmacy has been consulted for aztreonam, metronidazole, and vancomycin dosing.  Plan: Vancomycin trough 14 mcg/mL. Increase to 1 gm IV Q12H. Will recheck trough before fifth dose to target 15 to 20 mcg/mL. Pharmacy will continue to follow and adjust.   Height: 5' 2.99" (160 cm) Weight: 141 lb 15.6 oz (64.4 kg) IBW/kg (Calculated) : 52.38  Temp (24hrs), Avg:98.5 F (36.9 C), Min:98 F (36.7 C), Max:98.9 F (37.2 C)   Recent Labs Lab 02/03/16 2003 02/03/16 2135 02/04/16 0638 02/06/16 0406 02/06/16 1423 02/07/16 0417 02/08/16 0318  WBC 25.7*  --  24.7* 11.9*  --  12.3*  --   CREATININE 1.03*  --  0.75 0.77  --  0.62  --   LATICACIDVEN  --  1.6  --   --   --   --   --   VANCOTROUGH  --   --   --   --  9*  --  14    Estimated Creatinine Clearance: 40.5 mL/min (by C-G formula based on Cr of 0.62).    Allergies  Allergen Reactions  . Amoxicillin   . Cephalexin   . Prednisolone   . Prednisone   . Toradol [Ketorolac Tromethamine]   . Tramadol     Thank you for allowing pharmacy to be a part of this patient's care.  Carola FrostNathan A Vitaliy [name], Pharm.D., BCPS Clinical Pharmacist 02/08/2016 4:53 AM

## 2016-02-08 NOTE — Progress Notes (Signed)
[facility] -  at [facility]                                                                                                                                                                                            Patient Demographics   [name], is a 82 y.o. female, DOB - November 15, 1920, ZOX:[id number]  Admit date - 02/03/2016   Admitting Physician Oralia Manis, MD  Outpatient Primary MD for the patient is DAVID Terance Hart, MD   LOS - 5  Subjective: Still has a lot of leg pain. Admitted for right leg cellulitis.  Review of Systems:   CONSTITUTIONAL;no :Fever fatigue, weakness. No weight gain, no weight loss.  EYES: No blurry or double vision.  ENT: No tinnitus. No postnasal drip. No redness of the oropharynx.  RESPIRATORY: No cough, no wheeze, no hemoptysis. No dyspnea.  CARDIOVASCULAR: No chest pain. No orthopnea. No palpitations. No syncope.  GASTROINTESTINAL: No nausea, no vomiting or diarrhea. No abdominal pain. No melena or hematochezia.  GENITOURINARY: No dysuria or hematuria.  ENDOCRINE: No polyuria or nocturia. No heat or cold intolerance.  HEMATOLOGY: No anemia. No bruising. No bleeding.  INTEGUMENTARY: Right leg swelling and redness ,pain.  MUSCULOSKELETAL: No arthritis. No swelling. No gout.  NEUROLOGIC: No numbness, tingling, or ataxia. No seizure-type activity.  PSYCHIATRIC: No anxiety. No insomnia. No ADD.    Vitals:   Filed Vitals:   02/07/16 0829 02/07/16 1313 02/07/16 2027 02/08/16 0514  BP: 123/71 131/59 151/76 141/65  Pulse: 108 108 97 127  Temp:  98.9 F (37.2 C) 98.6 F (37 C) 97.9 F (36.6 C)  TempSrc:  Oral Oral Oral  Resp: 20 20    Height:      Weight:      SpO2: 93% 94% 92% 92%    Wt Readings from Last 3 Encounters:  02/03/16 64.4 kg (141 lb 15.6 oz)  07/15/15 64.411 kg (142 lb)  04/08/15 64.864 kg (143 lb)     Intake/Output Summary (Last 24 hours) at 02/08/16 0957 Last data filed at 02/08/16 0551  Gross  per 24 hour  Intake    870 ml  Output      0 ml  Net    870 ml    Physical Exam:   GENERAL: Pleasant-appearing in no apparent distress.  HEAD, EYES, EARS, NOSE AND THROAT: Atraumatic, normocephalic. Extraocular muscles are intact. Pupils equal and reactive to light. Sclerae anicteric. No conjunctival injection. No oro-pharyngeal erythema.  NECK: Supple. There is no jugular venous distention. No bruits, no lymphadenopathy, no thyromegaly.  HEART: Regular rate and rhythm,. No murmurs, no rubs, no  clicks.  LUNGS: Clear to auscultation bilaterally. No rales or rhonchi. No wheezes.  ABDOMEN: Soft, flat, nontender, nondistended. Has good bowel sounds. No hepatosplenomegaly appreciated.  EXTREMITIES: Left BKA , right foot with erythema extending up to the knee some improvement dressing in place  Now there erythema is in thigh as well NEUROLOGIC: The patient is alert, awake, and oriented x3 with no focal motor or sensory deficits appreciated bilaterally.  SKIN: Moist and warm with no rashes appreciated.  Psych: Not anxious, depressed LN: No inguinal LN enlargement    Antibiotics   Anti-infectives    Start     Dose/Rate Route Frequency Ordered Stop   02/08/16 1600  vancomycin (VANCOCIN) IVPB 1000 mg/200 mL premix     1,000 mg 200 mL/hr over 60 Minutes Intravenous Every 12 hours 02/08/16 0452     02/07/16 0400  vancomycin (VANCOCIN) IVPB 750 mg/150 ml premix  Status:  Discontinued     750 mg 150 mL/hr over 60 Minutes Intravenous Every 12 hours 02/06/16 1813 02/08/16 0542   02/04/16 1500  vancomycin (VANCOCIN) IVPB 750 mg/150 ml premix  Status:  Discontinued     750 mg 150 mL/hr over 60 Minutes Intravenous Every 24 hours 02/03/16 2356 02/06/16 1812   02/04/16 0600  aztreonam (AZACTAM) 1 g in dextrose 5 % 50 mL IVPB     1 g 100 mL/hr over 30 Minutes Intravenous Every 8 hours 02/03/16 2356     02/04/16 0600  metroNIDAZOLE (FLAGYL) IVPB 500 mg     500 mg 100 mL/hr over 60 Minutes Intravenous  Every 8 hours 02/03/16 2356     02/03/16 2100  vancomycin (VANCOCIN) IVPB 1000 mg/200 mL premix     1,000 mg 200 mL/hr over 60 Minutes Intravenous  Once 02/03/16 2047 02/03/16 2239   02/03/16 2100  aztreonam (AZACTAM) 2 g in dextrose 5 % 50 mL IVPB     2 g 100 mL/hr over 30 Minutes Intravenous  Once 02/03/16 2050 02/03/16 2309   02/03/16 2100  metroNIDAZOLE (FLAGYL) IVPB 500 mg     500 mg 100 mL/hr over 60 Minutes Intravenous  Once 02/03/16 2050 02/03/16 2239      Medications   Scheduled Meds: . apixaban  5 mg Oral BID  . aztreonam  1 g Intravenous Q8H  . digoxin  0.125 mg Oral Daily  . docusate sodium  200 mg Oral BID  . furosemide  20 mg Oral BID  . insulin aspart  0-5 Units Subcutaneous QHS  . insulin aspart  0-9 Units Subcutaneous TID WC  . metoprolol succinate  50 mg Oral BID  . metronidazole  500 mg Intravenous Q8H  . sodium chloride flush  3 mL Intravenous Q12H  . vancomycin  1,000 mg Intravenous Q12H   Continuous Infusions:  PRN Meds:.acetaminophen **OR** acetaminophen, bisacodyl, magnesium hydroxide, ondansetron **OR** ondansetron (ZOFRAN) IV, oxyCODONE   Data Review:   Micro Results Recent Results (from the past 240 hour(s))  Urine culture     Status: None   Collection Time: 02/03/16  9:14 PM  Result Value Ref Range Status   Specimen Description URINE, RANDOM  Final   Special Requests Normal  Final   Culture NO GROWTH 2 DAYS  Final   Report Status 02/05/2016 FINAL  Final  Culture, blood (routine x 2)     Status: None   Collection Time: 02/03/16  9:15 PM  Result Value Ref Range Status   Specimen Description BLOOD LEFT HAND  Final   Special Requests  BOTTLES DRAWN AEROBIC AND ANAEROBIC  Final   Culture NO GROWTH 5 DAYS  Final   Report Status 02/08/2016 FINAL  Final  Wound culture     Status: None   Collection Time: 02/03/16  9:26 PM  Result Value Ref Range Status   Specimen Description WOUND  Final   Special Requests Normal  Final   Gram Stain    Final    NO WBC SEEN RARE GRAM POSITIVE COCCI FEW GRAM NEGATIVE RODS    Culture   Final    MODERATE GROWTH PROTEUS MIRABILIS MODERATE GROWTH STAPHYLOCOCCUS AUREUS MODERATE GROWTH ESCHERICHIA COLI    Report Status 02/07/2016 FINAL  Final   Organism ID, Bacteria ESCHERICHIA COLI  Final   Organism ID, Bacteria PROTEUS MIRABILIS  Final   Organism ID, Bacteria STAPHYLOCOCCUS AUREUS  Final      Susceptibility   Staphylococcus aureus - MIC*    CIPROFLOXACIN Value in next row Resistant      RESISTANT>=8    ERYTHROMYCIN Value in next row Resistant      RESISTANT>=8    GENTAMICIN Value in next row Sensitive      SENSITIVE<0.5    OXACILLIN Value in next row Sensitive      SENSITIVE0.5    TETRACYCLINE Value in next row Sensitive      SENSITIVE2    VANCOMYCIN Value in next row Sensitive      SENSITIVE1    TRIMETH/SULFA Value in next row Sensitive      SENSITIVE<=10    CLINDAMYCIN Value in next row Resistant      RESISTANT>=8    RIFAMPIN Value in next row Sensitive      SENSITIVE<=0.5    Inducible Clindamycin Value in next row Sensitive      SENSITIVE<=0.5    * MODERATE GROWTH STAPHYLOCOCCUS AUREUS   Escherichia coli - MIC*    AMPICILLIN Value in next row Resistant      SENSITIVE<=0.5    CEFAZOLIN Value in next row Sensitive      SENSITIVE<=0.5    CEFEPIME Value in next row Sensitive      SENSITIVE<=0.5    CEFTAZIDIME Value in next row Sensitive      SENSITIVE<=0.5    CEFTRIAXONE Value in next row Sensitive      SENSITIVE<=0.5    CIPROFLOXACIN Value in next row Resistant      SENSITIVE<=0.5    GENTAMICIN Value in next row Sensitive      SENSITIVE<=0.5    IMIPENEM Value in next row Sensitive      SENSITIVE<=0.5    TRIMETH/SULFA Value in next row Resistant      SENSITIVE<=0.5    AMPICILLIN/SULBACTAM Value in next row Intermediate      SENSITIVE<=0.5    PIP/TAZO Value in next row Sensitive      SENSITIVE<=0.5    Extended ESBL Value in next row Sensitive      SENSITIVE<=0.5     * MODERATE GROWTH ESCHERICHIA COLI   Proteus mirabilis - MIC*    AMPICILLIN Value in next row Resistant      RESISTANT>=32    CEFAZOLIN Value in next row Sensitive      SENSITIVE<=4    CEFEPIME Value in next row Sensitive      SENSITIVE<=1    CEFTAZIDIME Value in next row Sensitive      SENSITIVE<=1    CEFTRIAXONE Value in next row Sensitive      SENSITIVE<=1    CIPROFLOXACIN Value in next row Sensitive  SENSITIVE1    GENTAMICIN Value in next row Resistant      RESISTANT>=16    IMIPENEM Value in next row Sensitive      SENSITIVE2    TRIMETH/SULFA Value in next row Resistant      RESISTANT>=320    AMPICILLIN/SULBACTAM Value in next row Sensitive      SENSITIVE8    PIP/TAZO Value in next row Sensitive      SENSITIVE<=4    * MODERATE GROWTH PROTEUS MIRABILIS  Anaerobic culture     Status: None   Collection Time: 02/03/16  9:26 PM  Result Value Ref Range Status   Specimen Description LEG  Final   Special Requests Normal  Final   Culture NO ANAEROBES ISOLATED  Final   Report Status 02/07/2016 FINAL  Final    Radiology Reports Dg Chest Port 1 View  02/03/2016  CLINICAL DATA:  Facial swelling and erythema. Reese's. Sudden onset this evening. EXAM: PORTABLE CHEST 1 VIEW COMPARISON:  12/26/2014 FINDINGS: A single AP portable view of the chest demonstrates no focal airspace consolidation or alveolar edema. The lungs are grossly clear. There is no large effusion or pneumothorax. There is unchanged cardiomegaly. Cardiac and mediastinal contours are otherwise unremarkable. IMPRESSION: Unchanged cardiomegaly.  No acute cardiopulmonary findings. Electronically Signed   By: Ellery Plunk M.D.   On: 02/03/2016 22:21   Dg Foot Complete Right  01/22/2016  CLINICAL DATA:  Nonhealing wound over the second toe of the right foot; images were obtained with foot in bandages. The patient has known diabetes and peripheral vascular disease. EXAM: RIGHT FOOT COMPLETE - 3+ VIEW COMPARISON:  Right  foot series of Feb 11, 2015. FINDINGS: The bones of the right foot are diffusely osteopenic which appears more severe overall on today's study than on the study of 11 months ago. No definite loss of cortical bone is observed in the second toe. The joint spaces are preserved. The other phalanges appear intact as well as do the metatarsals. The bones of the hindfoot are unremarkable. There is soft tissue swelling of the second toe. IMPRESSION: Findings compatible with cellulitis of the second toe. No objective evidence of osteomyelitis but there is severe diffuse osteopenia which limits sensitivity of the study. Electronically Signed   By: David  [place] M.D.   On: 01/22/2016 16:22     CBC  Recent Labs Lab 02/03/16 2003 02/04/16 0638 02/06/16 0406 02/07/16 0417  WBC 25.7* 24.7* 11.9* 12.3*  HGB 10.3* 10.1* 9.6* 9.5*  HCT 30.3* 30.3* 28.2* 28.2*  PLT 199 178 238 274  MCV 89.7 92.0 90.0 91.5  MCH 30.4 30.8 30.5 30.7  MCHC 33.9 33.5 33.8 33.5  RDW 17.2* 16.7* 16.7* 16.6*  LYMPHSABS 1.2  --   --   --   MONOABS 0.7  --   --   --   EOSABS 0.0  --   --   --   BASOSABS 0.1  --   --   --     Chemistries   Recent Labs Lab 02/03/16 2003 02/04/16 0638 02/05/16 1923 02/06/16 0406 02/07/16 0417  NA 133* 138  --  133* 134*  K 3.8 3.6  --  4.0 3.8  CL 97* 103  --  99* 101  CO2 31 28  --  27 27  GLUCOSE 172* 103*  --  89 116*  BUN 37* 31*  --  32* 24*  CREATININE 1.03* 0.75  --  0.77 0.62  CALCIUM 8.5* 8.1*  --  8.5*  8.4*  MG  --   --  1.9  --   --   AST 36  --   --   --   --   ALT 17  --   --   --   --   ALKPHOS 73  --   --   --   --   BILITOT 0.8  --   --   --   --    ------------------------------------------------------------------------------------------------------------------ estimated creatinine clearance is 40.5 mL/min (by C-G formula based on Cr of 0.62). ------------------------------------------------------------------------------------------------------------------ No  results for input(s): HGBA1C in the last 72 hours. ------------------------------------------------------------------------------------------------------------------ No results for input(s): CHOL, HDL, LDLCALC, TRIG, CHOLHDL, LDLDIRECT in the last 72 hours. ------------------------------------------------------------------------------------------------------------------ No results for input(s): TSH, T4TOTAL, T3FREE, THYROIDAB in the last 72 hours.  Invalid input(s): FREET3 ------------------------------------------------------------------------------------------------------------------ No results for input(s): VITAMINB12, FOLATE, FERRITIN, TIBC, IRON, RETICCTPCT in the last 72 hours.  Coagulation profile  Recent Labs Lab 02/03/16 2003  INR 2.03    No results for input(s): DDIMER in the last 72 hours.  Cardiac Enzymes  Recent Labs Lab 02/03/16 2003 02/04/16 0023 02/04/16 0638  TROPONINI 0.38* 0.25* 0.17*   ------------------------------------------------------------------------------------------------------------------ Invalid input(s): POCBNP    Assessment & Plan  Patient is a 82 year old white female being admitted with cellulitis  1.  Right lower extremity Cellulitis  :  Patient with multiple allergies to antibiotics. Continue vancomycin and aztreonam and Flagyl. Wound care consult for chronic lower extremity wound WBC trending down continue current antibiotics Patient seen by vascular surgery she will have arteriogram on Monday or Tuesday Wound culture showed Proteus, Escherichia coli, staph aureus. Wbc is coming down, Continue pain medicine. 2.  Diabetic foot ulcer (HCC) - wound care consult  3. Paroxysmal atrial fibrillation ; continue digoxin, increase her Toprol-XL 100 mg twice a day because patient is tachycardic, and continue eliquis. 4. Diabetes type 2; c;ontinue sliding scale insulin  overall stable   5. PVD (peripheral vascular disease) (HCC) - continue  home meds including anticoagulationvascular consult as abo  6.  HTN (hypertension) - continue therapy with Toprol   7. Chronic congestive heart failure type unknown  compensated continue oral Lasix  8. Elevated troponin due to demand ischemia  9. DVT prophylaxis patient RD on therapy with Eliquis #10: Constipation: Continue Colace, and milk of magnesia, Dulcolax suppositories as needed  D/w daughter     Code Status Orders        Start     Ordered   02/04/16 0029  Do not attempt resuscitation (DNR)   Continuous    Question Answer Comment  In the event of cardiac or respiratory ARREST Do not call a "code blue"   In the event of cardiac or respiratory ARREST Do not perform Intubation, CPR, defibrillation or ACLS   In the event of cardiac or respiratory ARREST Use medication by any route, position, wound care, and other measures to relive pain and suffering. May use oxygen, suction and manual treatment of airway obstruction as needed for comfort.      02/04/16 0028    Code Status History    Date Active Date Inactive Code Status Order ID Comments User Context   02/04/2016 12:17 AM 02/04/2016 12:28 AM Full Code [id number]  Oralia Manisavid Willis, MD Inpatient   07/15/2015  4:09 PM 07/15/2015  9:52 PM Full Code [id number]  Renford DillsGregory G Schnier, MD Inpatient   04/08/2015  9:38 AM 04/08/2015  3:24 PM Full Code [id number]  Renford DillsGregory G Schnier, MD Inpatient  Advance Directive Documentation        Most Recent Value   Type of Advance Directive  Out of facility DNR (pink MOST or yellow form)   Pre-existing out of facility DNR order (yellow form or pink MOST form)  Physician notified to receive inpatient order   "MOST" Form in Place?             Consults  Wound care nurse DVT Prophylaxis  Eliquis  Lab Results  Component Value Date   PLT 274 02/07/2016     Time Spent in minutes   25 min  Greater than 50% of time spent in care coordination and counseling patient regarding the condition and plan of  care.   Katha Hamming M.D on 02/08/2016 at 9:57 AM  Between 7am to 6pm - Pager - [phone]  After 6pm go to [email or web address] - password [email or web address] [facility]  [facility] [place] [facility]   Office  [phone]

## 2016-02-09 ENCOUNTER — Encounter
Admission: EM | Disposition: A | Discharge status: Skilled Nursing Facility | Payer: Self-pay | Source: Home / Self Care | Attending: Internal Medicine

## 2016-02-09 HISTORY — PX: PERIPHERAL VASCULAR CATHETERIZATION: SHX172C

## 2016-02-09 LAB — CBC
HEMATOCRIT: 28.8 % — AB (ref 35.0–47.0)
HEMOGLOBIN: 9.6 g/dL — AB (ref 12.0–16.0)
MCH: 30.7 pg (ref 26.0–34.0)
MCHC: 33.5 g/dL (ref 32.0–36.0)
MCV: 91.7 fL (ref 80.0–100.0)
Platelets: 379 10*3/uL (ref 150–440)
RBC: 3.14 MIL/uL — ABNORMAL LOW (ref 3.80–5.20)
RDW: 16.9 % — ABNORMAL HIGH (ref 11.5–14.5)
WBC: 15 10*3/uL — ABNORMAL HIGH (ref 3.6–11.0)

## 2016-02-09 LAB — BASIC METABOLIC PANEL
Anion gap: 7 (ref 5–15)
BUN: 23 mg/dL — AB (ref 6–20)
CHLORIDE: 98 mmol/L — AB (ref 101–111)
CO2: 28 mmol/L (ref 22–32)
CREATININE: 0.58 mg/dL (ref 0.44–1.00)
Calcium: 8.2 mg/dL — ABNORMAL LOW (ref 8.9–10.3)
GFR calc Af Amer: >60 mL/min (ref >60)
GFR calc non Af Amer: >60 mL/min (ref >60)
Glucose, Bld: 99 mg/dL (ref 65–99)
Potassium: 4.1 mmol/L (ref 3.5–5.1)
Sodium: 133 mmol/L — ABNORMAL LOW (ref 135–145)

## 2016-02-09 LAB — GLUCOSE, CAPILLARY
Glucose-Capillary: 113 mg/dL — ABNORMAL HIGH (ref 65–99)
Glucose-Capillary: 92 mg/dL (ref 65–99)
Glucose-Capillary: 103 mg/dL — ABNORMAL HIGH (ref 65–99)
Glucose-Capillary: 195 mg/dL — ABNORMAL HIGH (ref 65–99)

## 2016-02-09 SURGERY — LOWER EXTREMITY ANGIOGRAPHY
Anesthesia: Moderate Sedation | Laterality: Right

## 2016-02-09 MED ORDER — MIDAZOLAM HCL 2 MG/2ML IJ SOLN
INTRAMUSCULAR | Status: AC
Start: 2016-02-09 — End: 2016-02-09
  Filled 2016-02-09: qty 2

## 2016-02-09 MED ORDER — MIDAZOLAM HCL 2 MG/2ML IJ SOLN
INTRAMUSCULAR | Status: AC
  Filled 2016-02-09: qty 2

## 2016-02-09 MED ORDER — MIDAZOLAM HCL 2 MG/2ML IJ SOLN
INTRAMUSCULAR | Status: DC | PRN
  Administered 2016-02-09: 2 mg via INTRAVENOUS
  Administered 2016-02-09: 1 mg via INTRAVENOUS

## 2016-02-09 MED ORDER — LIDOCAINE-EPINEPHRINE (PF) 1 %-1:200000 IJ SOLN
INTRAMUSCULAR | Status: AC
Start: 2016-02-09 — End: 2016-02-09
  Filled 2016-02-09: qty 30

## 2016-02-09 MED ORDER — FENTANYL CITRATE (PF) 100 MCG/2ML IJ SOLN
INTRAMUSCULAR | Status: AC
  Filled 2016-02-09: qty 2

## 2016-02-09 MED ORDER — HEPARIN (PORCINE) IN NACL 2-0.9 UNIT/ML-% IJ SOLN
INTRAMUSCULAR | Status: AC
  Filled 2016-02-09: qty 1000

## 2016-02-09 MED ORDER — IOPAMIDOL (ISOVUE-300) INJECTION 61%
INTRAVENOUS | Status: DC | PRN
  Administered 2016-02-09: 70 mL via INTRA_ARTERIAL

## 2016-02-09 MED ORDER — FENTANYL CITRATE (PF) 100 MCG/2ML IJ SOLN
INTRAMUSCULAR | Status: DC | PRN
  Administered 2016-02-09: 50 ug via INTRAVENOUS
  Administered 2016-02-09: 25 ug via INTRAVENOUS

## 2016-02-09 MED ORDER — HEPARIN SODIUM (PORCINE) 1000 UNIT/ML IJ SOLN
INTRAMUSCULAR | Status: AC
  Filled 2016-02-09: qty 1

## 2016-02-09 MED ORDER — HEPARIN SODIUM (PORCINE) 1000 UNIT/ML IJ SOLN
INTRAMUSCULAR | Status: DC | PRN
  Administered 2016-02-09: 3500 [IU] via INTRAVENOUS

## 2016-02-09 SURGICAL SUPPLY — 24 items
BALLN ULTRVRSE 2.5X220X150 (BALLOONS) ×4
BALLN ULTRVRSE 2X220X150 (BALLOONS) ×4
BALLN ULTRVRSE 3X80X150 (BALLOONS) ×2
BALLN ULTRVRSE 3X80X150 OTW (BALLOONS) ×2
BALLOON ULTRVRSE 2.5X220X150 (BALLOONS) ×2 IMPLANT
BALLOON ULTRVRSE 2X220X150 (BALLOONS) ×2 IMPLANT
BALLOON ULTRVRSE 3X80X150 OTW (BALLOONS) ×2 IMPLANT
CATH CXI SUPP ANG 4FR 135 (MICROCATHETER) ×2 IMPLANT
CATH CXI SUPP ANG 4FR 135CM (MICROCATHETER) ×4
CATH PIG 70CM (CATHETERS) ×4 IMPLANT
CATH RIM 65CM (CATHETERS) ×4 IMPLANT
CATH VERT 100CM (CATHETERS) ×4 IMPLANT
DEVICE PRESTO INFLATION (MISCELLANEOUS) ×4 IMPLANT
DEVICE STARCLOSE SE CLOSURE (Vascular Products) ×4 IMPLANT
GUIDEWIRE PFTE-COATED .018X300 (WIRE) ×4 IMPLANT
GUIDEWIRE SUPER STIFF .035X180 (WIRE) ×4 IMPLANT
PACK ANGIOGRAPHY (CUSTOM PROCEDURE TRAY) ×4 IMPLANT
SHEATH BRITE TIP 5FRX11 (SHEATH) ×4 IMPLANT
SHEATH RAABE 6FRX70 (SHEATH) ×4 IMPLANT
SYR MEDRAD MARK V 150ML (SYRINGE) ×4 IMPLANT
TUBING CONTRAST HIGH PRESS 72 (TUBING) ×4 IMPLANT
WIRE G V18X300CM (WIRE) ×8 IMPLANT
WIRE J 3MM .035X145CM (WIRE) ×4 IMPLANT
WIRE MAGIC TORQUE 260C (WIRE) ×4 IMPLANT

## 2016-02-09 NOTE — Progress Notes (Signed)
Pt transported to vascular.  °

## 2016-02-09 NOTE — Progress Notes (Signed)
[facility] [place] at [facility]                                                                                                                                                                                            Patient Demographics   [name] [name], is a 82 y.o. female, DOB - 12/20/1920, ZOX:[id number]  Admit date - 02/03/2016   Admitting Physician Oralia Manisavid Willis, MD  Outpatient Primary MD for the patient is [name] Terance HartBRONSTEIN, MD   LOS - 6  Subjective: Does have right leg pain, scheduled for angiogram today. No fever.  Review of Systems:   CONSTITUTIONAL;no :Fever fatigue, weakness. No weight gain, no weight loss.  EYES: No blurry or double vision.  ENT: No tinnitus. No postnasal drip. No redness of the oropharynx.  RESPIRATORY: No cough, no wheeze, no hemoptysis. No dyspnea.  CARDIOVASCULAR: No chest pain. No orthopnea. No palpitations. No syncope.  GASTROINTESTINAL: No nausea, no vomiting or diarrhea. No abdominal pain. No melena or hematochezia.  GENITOURINARY: No dysuria or hematuria.  ENDOCRINE: No polyuria or nocturia. No heat or cold intolerance.  HEMATOLOGY: No anemia. No bruising. No bleeding.  INTEGUMENTARY: Right leg swelling and redness ,pain.  MUSCULOSKELETAL: No arthritis. No swelling. No gout.  NEUROLOGIC: No numbness, tingling, or ataxia. No seizure-type activity.  PSYCHIATRIC: No anxiety. No insomnia. No ADD.    Vitals:   Filed Vitals:   02/07/16 2027 02/08/16 0514 02/08/16 2034 02/09/16 0442  BP: 151/76 141/65 114/66 120/44  Pulse: 97 127 114 103  Temp: 98.6 F (37 C) 97.9 F (36.6 C) 98 F (36.7 C) 98.1 F (36.7 C)  TempSrc: Oral Oral Oral Oral  Resp:      Height:      Weight:      SpO2: 92% 92% 95% 93%    Wt Readings from Last 3 Encounters:  02/03/16 64.4 kg (141 lb 15.6 oz)  07/15/15 64.411 kg (142 lb)  04/08/15 64.864 kg (143 lb)     Intake/Output Summary (Last 24 hours) at 02/09/16 0929 Last data filed at  02/09/16 0550  Gross per 24 hour  Intake 556.25 ml  Output      0 ml  Net 556.25 ml    Physical Exam:   GENERAL: Pleasant-appearing in no apparent distress.  HEAD, EYES, EARS, NOSE AND THROAT: Atraumatic, normocephalic. Extraocular muscles are intact. Pupils equal and reactive to light. Sclerae anicteric. No conjunctival injection. No oro-pharyngeal erythema.  NECK: Supple. There is no jugular venous distention. No bruits, no lymphadenopathy, no thyromegaly.  HEART: Regular rate and rhythm,. No murmurs, no rubs, no clicks.  LUNGS: Clear  to auscultation bilaterally. No rales or rhonchi. No wheezes.  ABDOMEN: Soft, flat, nontender, nondistended. Has good bowel sounds. No hepatosplenomegaly appreciated.  EXTREMITIES: Left BKA , right foot with erythema extending up to the knee some improvement d NEUROLOGIC: The patient is alert, awake, and oriented x3 with no focal motor or sensory deficits appreciated bilaterally.  SKIN: Moist and warm with no rashes appreciated.  Psych: Not anxious, depressed LN: No inguinal LN enlargement    Antibiotics   Anti-infectives    Start     Dose/Rate Route Frequency Ordered Stop   02/08/16 1600  vancomycin (VANCOCIN) IVPB 1000 mg/200 mL premix     1,000 mg 200 mL/hr over 60 Minutes Intravenous Every 12 hours 02/08/16 0452     02/07/16 0400  vancomycin (VANCOCIN) IVPB 750 mg/150 ml premix  Status:  Discontinued     750 mg 150 mL/hr over 60 Minutes Intravenous Every 12 hours 02/06/16 1813 02/08/16 0542   02/04/16 1500  vancomycin (VANCOCIN) IVPB 750 mg/150 ml premix  Status:  Discontinued     750 mg 150 mL/hr over 60 Minutes Intravenous Every 24 hours 02/03/16 2356 02/06/16 1812   02/04/16 0600  aztreonam (AZACTAM) 1 g in dextrose 5 % 50 mL IVPB     1 g 100 mL/hr over 30 Minutes Intravenous Every 8 hours 02/03/16 2356     02/04/16 0600  metroNIDAZOLE (FLAGYL) IVPB 500 mg     500 mg 100 mL/hr over 60 Minutes Intravenous Every 8 hours 02/03/16 2356      02/03/16 2100  vancomycin (VANCOCIN) IVPB 1000 mg/200 mL premix     1,000 mg 200 mL/hr over 60 Minutes Intravenous  Once 02/03/16 2047 02/03/16 2239   02/03/16 2100  aztreonam (AZACTAM) 2 g in dextrose 5 % 50 mL IVPB     2 g 100 mL/hr over 30 Minutes Intravenous  Once 02/03/16 2050 02/03/16 2309   02/03/16 2100  metroNIDAZOLE (FLAGYL) IVPB 500 mg     500 mg 100 mL/hr over 60 Minutes Intravenous  Once 02/03/16 2050 02/03/16 2239      Medications   Scheduled Meds: . apixaban  5 mg Oral BID  . aztreonam  1 g Intravenous Q8H  . digoxin  0.125 mg Oral Daily  . docusate sodium  200 mg Oral BID  . furosemide  20 mg Oral BID  . insulin aspart  0-5 Units Subcutaneous QHS  . insulin aspart  0-9 Units Subcutaneous TID WC  . metoprolol succinate  100 mg Oral BID  . metronidazole  500 mg Intravenous Q8H  . sodium chloride flush  3 mL Intravenous Q12H  . vancomycin  1,000 mg Intravenous Q12H   Continuous Infusions: . sodium chloride 75 mL/hr at 02/09/16 0105   PRN Meds:.acetaminophen **OR** acetaminophen, bisacodyl, magnesium hydroxide, ondansetron **OR** ondansetron (ZOFRAN) IV, oxyCODONE   Data Review:   Micro Results Recent Results (from the past 240 hour(s))  Urine culture     Status: None   Collection Time: 02/03/16  9:14 PM  Result Value Ref Range Status   Specimen Description URINE, RANDOM  Final   Special Requests Normal  Final   Culture NO GROWTH 2 DAYS  Final   Report Status 02/05/2016 FINAL  Final  Culture, blood (routine x 2)     Status: None   Collection Time: 02/03/16  9:15 PM  Result Value Ref Range Status   Specimen Description BLOOD LEFT HAND  Final   Special Requests BOTTLES DRAWN AEROBIC AND ANAEROBIC  Final   Culture NO GROWTH 5 DAYS  Final   Report Status 02/08/2016 FINAL  Final  Wound culture     Status: None   Collection Time: 02/03/16  9:26 PM  Result Value Ref Range Status   Specimen Description WOUND  Final   Special Requests Normal  Final    Gram Stain   Final    NO WBC SEEN RARE GRAM POSITIVE COCCI FEW GRAM NEGATIVE RODS    Culture   Final    MODERATE GROWTH PROTEUS MIRABILIS MODERATE GROWTH STAPHYLOCOCCUS AUREUS MODERATE GROWTH ESCHERICHIA COLI    Report Status 02/07/2016 FINAL  Final   Organism ID, Bacteria ESCHERICHIA COLI  Final   Organism ID, Bacteria PROTEUS MIRABILIS  Final   Organism ID, Bacteria STAPHYLOCOCCUS AUREUS  Final      Susceptibility   Staphylococcus aureus - MIC*    CIPROFLOXACIN Value in next row Resistant      RESISTANT>=8    ERYTHROMYCIN Value in next row Resistant      RESISTANT>=8    GENTAMICIN Value in next row Sensitive      SENSITIVE<0.5    OXACILLIN Value in next row Sensitive      SENSITIVE0.5    TETRACYCLINE Value in next row Sensitive      SENSITIVE2    VANCOMYCIN Value in next row Sensitive      SENSITIVE1    TRIMETH/SULFA Value in next row Sensitive      SENSITIVE<=10    CLINDAMYCIN Value in next row Resistant      RESISTANT>=8    RIFAMPIN Value in next row Sensitive      SENSITIVE<=0.5    Inducible Clindamycin Value in next row Sensitive      SENSITIVE<=0.5    * MODERATE GROWTH STAPHYLOCOCCUS AUREUS   Escherichia coli - MIC*    AMPICILLIN Value in next row Resistant      SENSITIVE<=0.5    CEFAZOLIN Value in next row Sensitive      SENSITIVE<=0.5    CEFEPIME Value in next row Sensitive      SENSITIVE<=0.5    CEFTAZIDIME Value in next row Sensitive      SENSITIVE<=0.5    CEFTRIAXONE Value in next row Sensitive      SENSITIVE<=0.5    CIPROFLOXACIN Value in next row Resistant      SENSITIVE<=0.5    GENTAMICIN Value in next row Sensitive      SENSITIVE<=0.5    IMIPENEM Value in next row Sensitive      SENSITIVE<=0.5    TRIMETH/SULFA Value in next row Resistant      SENSITIVE<=0.5    AMPICILLIN/SULBACTAM Value in next row Intermediate      SENSITIVE<=0.5    PIP/TAZO Value in next row Sensitive      SENSITIVE<=0.5    Extended ESBL Value in next row Sensitive       SENSITIVE<=0.5    * MODERATE GROWTH ESCHERICHIA COLI   Proteus mirabilis - MIC*    AMPICILLIN Value in next row Resistant      RESISTANT>=32    CEFAZOLIN Value in next row Sensitive      SENSITIVE<=4    CEFEPIME Value in next row Sensitive      SENSITIVE<=1    CEFTAZIDIME Value in next row Sensitive      SENSITIVE<=1    CEFTRIAXONE Value in next row Sensitive      SENSITIVE<=1    CIPROFLOXACIN Value in next row Sensitive      SENSITIVE1  GENTAMICIN Value in next row Resistant      RESISTANT>=16    IMIPENEM Value in next row Sensitive      SENSITIVE2    TRIMETH/SULFA Value in next row Resistant      RESISTANT>=320    AMPICILLIN/SULBACTAM Value in next row Sensitive      SENSITIVE8    PIP/TAZO Value in next row Sensitive      SENSITIVE<=4    * MODERATE GROWTH PROTEUS MIRABILIS  Anaerobic culture     Status: None   Collection Time: 02/03/16  9:26 PM  Result Value Ref Range Status   Specimen Description LEG  Final   Special Requests Normal  Final   Culture NO ANAEROBES ISOLATED  Final   Report Status 02/07/2016 FINAL  Final    Radiology Reports Dg Chest Port 1 View  02/03/2016  CLINICAL DATA:  Facial swelling and erythema. Reese's. Sudden onset this evening. EXAM: PORTABLE CHEST 1 VIEW COMPARISON:  12/26/2014 FINDINGS: A single AP portable view of the chest demonstrates no focal airspace consolidation or alveolar edema. The lungs are grossly clear. There is no large effusion or pneumothorax. There is unchanged cardiomegaly. Cardiac and mediastinal contours are otherwise unremarkable. IMPRESSION: Unchanged cardiomegaly.  No acute cardiopulmonary findings. Electronically Signed   By: Ellery Plunk M.D.   On: 02/03/2016 22:21   Dg Foot Complete Right  01/22/2016  CLINICAL DATA:  Nonhealing wound over the second toe of the right foot; images were obtained with foot in bandages. The patient has known diabetes and peripheral vascular disease. EXAM: RIGHT FOOT COMPLETE - 3+ VIEW  COMPARISON:  Right foot series of Feb 11, 2015. FINDINGS: The bones of the right foot are diffusely osteopenic which appears more severe overall on today's study than on the study of 11 months ago. No definite loss of cortical bone is observed in the second toe. The joint spaces are preserved. The other phalanges appear intact as well as do the metatarsals. The bones of the hindfoot are unremarkable. There is soft tissue swelling of the second toe. IMPRESSION: Findings compatible with cellulitis of the second toe. No objective evidence of osteomyelitis but there is severe diffuse osteopenia which limits sensitivity of the study. Electronically Signed   By: [name]  [place] M.D.   On: 01/22/2016 16:22     CBC  Recent Labs Lab 02/03/16 2003 02/04/16 4098 02/06/16 0406 02/07/16 0417 02/09/16 0443  WBC 25.7* 24.7* 11.9* 12.3* 15.0*  HGB 10.3* 10.1* 9.6* 9.5* 9.6*  HCT 30.3* 30.3* 28.2* 28.2* 28.8*  PLT 199 178 238 274 379  MCV 89.7 92.0 90.0 91.5 91.7  MCH 30.4 30.8 30.5 30.7 30.7  MCHC 33.9 33.5 33.8 33.5 33.5  RDW 17.2* 16.7* 16.7* 16.6* 16.9*  LYMPHSABS 1.2  --   --   --   --   MONOABS 0.7  --   --   --   --   EOSABS 0.0  --   --   --   --   BASOSABS 0.1  --   --   --   --     Chemistries   Recent Labs Lab 02/03/16 2003 02/04/16 0638 02/05/16 1923 02/06/16 0406 02/07/16 0417 02/09/16 0443  NA 133* 138  --  133* 134* 133*  K 3.8 3.6  --  4.0 3.8 4.1  CL 97* 103  --  99* 101 98*  CO2 31 28  --  27 27 28   GLUCOSE 172* 103*  --  89 116* 99  BUN 37* 31*  --  32* 24* 23*  CREATININE 1.03* 0.75  --  0.77 0.62 0.58  CALCIUM 8.5* 8.1*  --  8.5* 8.4* 8.2*  MG  --   --  1.9  --   --   --   AST 36  --   --   --   --   --   ALT 17  --   --   --   --   --   ALKPHOS 73  --   --   --   --   --   BILITOT 0.8  --   --   --   --   --    ------------------------------------------------------------------------------------------------------------------ estimated creatinine clearance is 40.5  mL/min (by C-G formula based on Cr of 0.58). ------------------------------------------------------------------------------------------------------------------ No results for input(s): HGBA1C in the last 72 hours. ------------------------------------------------------------------------------------------------------------------ No results for input(s): CHOL, HDL, LDLCALC, TRIG, CHOLHDL, LDLDIRECT in the last 72 hours. ------------------------------------------------------------------------------------------------------------------ No results for input(s): TSH, T4TOTAL, T3FREE, THYROIDAB in the last 72 hours.  Invalid input(s): FREET3 ------------------------------------------------------------------------------------------------------------------ No results for input(s): VITAMINB12, FOLATE, FERRITIN, TIBC, IRON, RETICCTPCT in the last 72 hours.  Coagulation profile  Recent Labs Lab 02/03/16 2003  INR 2.03    No results for input(s): DDIMER in the last 72 hours.  Cardiac Enzymes  Recent Labs Lab 02/03/16 2003 02/04/16 0023 02/04/16 0638  TROPONINI 0.38* 0.25* 0.17*   ------------------------------------------------------------------------------------------------------------------ Invalid input(s): POCBNP    Assessment & Plan  Patient is a 82 year old white female being admitted with cellulitis  1.  Right lower extremity Cellulitis  :  Patient with multiple allergies to antibiotics. Continue vancomycin and aztreonam and Flagyl. Wound care consult for chronic lower extremity wound WBC trending down ,continue current antibiotics Patient seen by vascular surgery she will have arteriogram this week,. Wound culture showed Proteus, Escherichia coli, staph aureus. Wbc is coming down, Continue pain medicine.  2.  Diabetic foot ulcer (HCC) - wound care consulted.  3. Paroxysmal atrial fibrillation ; continue digoxin, Toprol-XL, and eliquis. 4. Diabetes type 2; c;ontinue  sliding scale insulin  overall stable . Nothing by mouth now for a vascular procedure today.  5. PVD (peripheral vascular disease) (HCC) - continue home meds including anticoagulationvascular consult as abo  6.  HTN (hypertension) - continue therapy with Toprol   7. Chronic congestive heart failure type unknown  compensated continue oral Lasix  8. Elevated troponin due to demand ischemia  9. DVT prophylaxis patient RD on therapy with Eliquis #10: Constipation: Continue Colace, and milk of magnesia, Dulcolax suppositories as needed  D/w daughter     Code Status Orders        Start     Ordered   02/04/16 0029  Do not attempt resuscitation (DNR)   Continuous    Question Answer Comment  In the event of cardiac or respiratory ARREST Do not call a "code blue"   In the event of cardiac or respiratory ARREST Do not perform Intubation, CPR, defibrillation or ACLS   In the event of cardiac or respiratory ARREST Use medication by any route, position, wound care, and other measures to relive pain and suffering. May use oxygen, suction and manual treatment of airway obstruction as needed for comfort.      02/04/16 0028    Code Status History    Date Active Date Inactive Code Status Order ID Comments User Context   02/04/2016 12:17 AM 02/04/2016 12:28 AM Full Code [id number]  Oralia Manis, MD Inpatient  07/15/2015  4:09 PM 07/15/2015  9:52 PM Full Code [id number]  Renford Dills, MD Inpatient   04/08/2015  9:38 AM 04/08/2015  3:24 PM Full Code [id number]  Renford Dills, MD Inpatient    Advance Directive Documentation        Most Recent Value   Type of Advance Directive  Out of facility DNR (pink MOST or yellow form)   Pre-existing out of facility DNR order (yellow form or pink MOST form)  Physician notified to receive inpatient order   "MOST" Form in Place?             Consults  Wound care nurse DVT Prophylaxis  Eliquis  Lab Results  Component Value Date   PLT 379 02/09/2016      Time Spent in minutes   25 min  Greater than 50% of time spent in care coordination and counseling patient regarding the condition and plan of care.   Katha Hamming M.D on 02/09/2016 at 9:29 AM  Between 7am to 6pm - Pager - [phone]  After 6pm go to [email or web address] - password [email or web address] [facility]  [facility] [place] [facility]   Office  [phone]

## 2016-02-09 NOTE — Progress Notes (Signed)
Report to danielle.  Check right groin for bleeding or hematoma.  Patient will be on bedrest for 2 hours post sheath pull---out of bed at 119:15.  pulses are  Doppler DP on the right lower extremity

## 2016-02-09 NOTE — Op Note (Signed)
[facility] Percutaneous Study/Intervention Procedural Note   Date of Surgery: 02/03/2016 - 02/09/2016  Surgeon(s):Rasool Rommel   Assistants:none  Pre-operative Diagnosis: PAD with ulceration right lower extremity  Post-operative diagnosis: Same  Procedure(s) Performed: 1. Ultrasound guidance for vascular access left femoral artery 2. Catheter placement into right anterior tibial artery, peroneal artery, and posterior tibial artery from left femoral approach 3. Aortogram and selective right lower extremity angiogram including selective images of the anterior tibial artery, peroneal artery, and posterior tibial arteries 4. Percutaneous transluminal angioplasty of proximal right anterior tibial artery with 3 mm diameter by 8 cm length angioplasty balloon 5. Percutaneous transluminal angioplasty of proximal to mid right posterior tibial artery with 2.5 mm diameter by 22 cm length angioplasty balloon  6.  Percutaneous transluminal angioplasty of the peroneal artery from the tibioperoneal trunk through the mid to distal peroneal artery with 2 mm diameter by 22 cm length angioplasty balloon 7. StarClose closure device left femoral artery  EBL: 25 cc  Contrast: 70 cc  Fluoro Time: 14 minutes  Moderate Conscious Sedation Time: approximately 60 minutes using 3 mg of Versed and 75 mcg of Fentanyl  Indications: Patient is a 82 y.o.female with nonhealing ulcerations on the right lower extremity. The patient has previous history of peripheral vascular disease and previous interventions. She is artery status post amputation on the left leg. The patient is brought in for angiography for further evaluation and potential treatment. Risks and benefits are discussed and informed consent is obtained  Procedure: The patient was identified and appropriate procedural time out was performed.  The patient was then placed supine on the table and prepped and draped in the usual sterile fashion.Moderate conscious sedation was administered during a face to face encounter with the patient throughout the procedure with my supervision of the RN administering medicines and monitoring the patient's vital signs, pulse oximetry, telemetry and mental status throughout from the start of the procedure until the patient was taken to the recovery room. Ultrasound was used to evaluate the left common femoral artery. It was patent . A digital ultrasound image was acquired. A Seldinger needle was used to access the left common femoral artery under direct ultrasound guidance and a permanent image was performed. A 0.035 J wire was advanced without resistance and a 5Fr sheath was placed. Pigtail catheter was placed into the aorta and an AP aortogram was performed. This demonstrated normal renal arteries and normal aorta and iliac segments without significant stenosis. I then crossed the aortic bifurcation and advanced to the right femoral head. Selective right lower extremity angiogram was then performed. This demonstrated 30% stenosis in the mid to distal SFA, 30% stenosis in the above-knee popliteal artery, 70-80% stenosis in the proximal anterior tibial artery which was the dominant runoff to the foot. The peroneal artery was small with multiple areas of high-grade stenosis or occlusion throughout. The posterior tibial artery was small with multiple areas of occlusion throughout. This was somewhat difficult to opacify initially, and later selective injections of the tibial vessels were performed for full delineation which agreed with the above conclusion. The patient was systemically heparinized and a 6 French 70 cm sheath was then placed over the Magic torque wire. I then used a Kumpe catheter and the Magic torque wire to get down into the tibioperoneal trunk. Initial imaging confirmed both vessels to have multiple  areas of stenosis and occlusion. The anterior tibial artery was the best runoff distally. I then exchanged for a CXI catheter and  a V 18 wire and selectively cannulated the anterior tibial artery crossing the proximal stenosis and confirming intraluminal flow distally. I then removed the diagnostic catheter and started treatment. A 3 mm diameter by 8 cm length angioplasty balloon was used to treat the proximal anterior tibial artery. This was inflated to 12 atm for 1 minute. Completion angiogram showed less than 20% residual stenosis in the anterior tibial artery. I then turned my attention to the posterior tibial artery. This was diffusely diseased. I was able to gain wire and catheter access to the distal posterior tibial artery but there was really no target at the ankle or foot that was amenable to treatment. I decided to balloon the proximal and mid posterior tibial artery to help collateral flow distally a 2.5 mm diameter by 22 cm length angioplasty balloon in the proximal to mid posterior tibial artery. Completion angiogram showed no greater than 20% residual stenosis in the area treated, but residual occlusion distally that we were unable to cross with a wire. I then turned my attention to the peroneal artery. This was also a small diseased vessel. In crossing the occlusion a connection to the venous system was at one point identified. I was able to then pull the CXI catheter and V 18 wire back and redirect a 0.018 advantage wire in the peroneal artery across the occlusion and all the way to the lateral tarsal artery area I then used a 2 mm diameter by 22 cm length angioplasty balloon to treat the tibioperoneal trunk, proximal and mid peroneal artery down to the distal peroneal artery not far above it split which was typical anatomy. This was inflated to 12 atm for 1 minute. Completion angiogram showed no greater than 40% residual stenosis with only faint connection to the venous system identified after  angioplasty of the peroneal artery. We had significantly improved her flow distally and her anterior tibial artery was now continuous to the foot. I elected to terminate the procedure. The sheath was removed and StarClose closure device was deployed in the left femoral artery with excellent hemostatic result. The patient was taken to the recovery room in stable condition having tolerated the procedure well.  Findings:  Aortogram: highly calcific and tortuous aorta and iliac arteries without stenosis, normal renal arteries Right Lower Extremity: 30% stenosis in the mid to distal SFA, 30% stenosis in the above-knee popliteal artery, 70-80% stenosis in the proximal anterior tibial artery which was the dominant runoff to the foot. The peroneal artery was small with multiple areas of high-grade stenosis or occlusion throughout. The posterior tibial artery was small with multiple areas of occlusion throughout.   Disposition: Patient was taken to the recovery room in stable condition having tolerated the procedure well.  Complications: None  [name] 02/09/2016 5:01 PM

## 2016-02-09 NOTE — Progress Notes (Signed)
Spoke with vascular, pt to have procedure this afternoon around 3.  Per vascular, ok to give pt's metoprolol.

## 2016-02-09 NOTE — H&P (Signed)
  [facility] History & Physical Update  The patient was interviewed and re-examined.  The patient's previous History and Physical has been reviewed and is unchanged.  There is no change in the plan of care. We plan to proceed with the scheduled procedure.  DEW,JASON, MD  02/09/2016, 3:04 PM

## 2016-02-10 LAB — CBC
HEMATOCRIT: 28.8 % — AB (ref 35.0–47.0)
HEMOGLOBIN: 9.7 g/dL — AB (ref 12.0–16.0)
MCH: 30.8 pg (ref 26.0–34.0)
MCHC: 33.8 g/dL (ref 32.0–36.0)
MCV: 91.1 fL (ref 80.0–100.0)
Platelets: 444 10*3/uL — ABNORMAL HIGH (ref 150–440)
RBC: 3.17 MIL/uL — ABNORMAL LOW (ref 3.80–5.20)
RDW: 16.9 % — AB (ref 11.5–14.5)
WBC: 13.7 10*3/uL — ABNORMAL HIGH (ref 3.6–11.0)

## 2016-02-10 LAB — GLUCOSE, CAPILLARY
GLUCOSE-CAPILLARY: 194 mg/dL — AB (ref 65–99)
GLUCOSE-CAPILLARY: 159 mg/dL — AB (ref 65–99)
GLUCOSE-CAPILLARY: 159 mg/dL — AB (ref 65–99)
Glucose-Capillary: 148 mg/dL — ABNORMAL HIGH (ref 65–99)
Glucose-Capillary: 151 mg/dL — ABNORMAL HIGH (ref 65–99)

## 2016-02-10 LAB — VANCOMYCIN, TROUGH: Vancomycin Tr: 61 ug/mL (ref 10–20)

## 2016-02-10 MED ORDER — NYSTATIN 100000 UNIT/GM EX POWD
Freq: Three times a day (TID) | CUTANEOUS | Status: DC
  Administered 2016-02-10 – 2016-02-13 (×9): via TOPICAL
  Filled 2016-02-10: qty 15

## 2016-02-10 MED ORDER — LACTULOSE 10 GM/15ML PO SOLN
30.0000 g | Freq: Once | ORAL | Status: AC
  Administered 2016-02-10: 20:00:00 30 g via ORAL
  Filled 2016-02-10: qty 60

## 2016-02-10 NOTE — Clinical Social Work Placement (Signed)
   CLINICAL SOCIAL WORK PLACEMENT  NOTE  Date:  02/10/2016  Patient Details  Name: [name] MRN: [id number] Date of Birth: 12/17/1920  Clinical Social Work is seeking post-discharge placement for this patient at the Skilled  Nursing Facility level of care (*CSW will initial, date and re-position this form in  chart as items are completed):  Yes   Patient/family provided with [facility] Clinical Social Work Department's list of facilities offering this level of care within the geographic area requested by the patient (or if unable, by the patient's family).  Yes   Patient/family informed of their freedom to choose among providers that offer the needed level of care, that participate in Medicare, Medicaid or managed care program needed by the patient, have an available bed and are willing to accept the patient.  Yes   Patient/family informed of [facility]'s ownership interest in [facility] and [facility], as well as of the fact that they are under no obligation to receive care at these facilities.  PASRR submitted to EDS on       PASRR number received on       Existing PASRR number confirmed on 02/10/16     FL2 transmitted to all facilities in geographic area requested by pt/family on 02/10/16     FL2 transmitted to all facilities within larger geographic area on       Patient informed that his/her managed care company has contracts with or will negotiate with certain facilities, including the following:            Patient/family informed of bed offers received.  Patient chooses bed at       Physician recommends and patient chooses bed at      Patient to be transferred to   on  .  Patient to be transferred to facility by       Patient family notified on   of transfer.  Name of family member notified:        PHYSICIAN       Additional Comment:    _______________________________________________ [name], [name] Zebrowski G, LCSW 02/10/2016, 2:53 PM

## 2016-02-10 NOTE — Progress Notes (Signed)
[facility] [place] at [facility]                                                                                                                                                                                            Patient Demographics   [name], is a 82 y.o. female, DOB - 1921/04/23, ZOX:[id number]  Admit date - 02/03/2016   Admitting Physician Oralia Manis, MD  Outpatient Primary MD for the patient is DAVID Terance Hart, MD   LOS - 7  Subjective: Does have right leg pain, Better than yesterday. Has a lot of diaper rash  Review of Systems:   CONSTITUTIONAL;no :Fever fatigue, weakness. No weight gain, no weight loss.  EYES: No blurry or double vision.  ENT: No tinnitus. No postnasal drip. No redness of the oropharynx.  RESPIRATORY: No cough, no wheeze, no hemoptysis. No dyspnea.  CARDIOVASCULAR: No chest pain. No orthopnea. No palpitations. No syncope.  GASTROINTESTINAL: No nausea, no vomiting or diarrhea. No abdominal pain. No melena or hematochezia.  GENITOURINARY: No dysuria or hematuria.  ENDOCRINE: No polyuria or nocturia. No heat or cold intolerance.  HEMATOLOGY: No anemia. No bruising. No bleeding.  INTEGUMENTARY: Right leg swelling and redness ,pain.  MUSCULOSKELETAL: No arthritis. No swelling. No gout.  NEUROLOGIC: No numbness, tingling, or ataxia. No seizure-type activity.  PSYCHIATRIC: No anxiety. No insomnia. No ADD.    Vitals:   Filed Vitals:   02/09/16 2049 02/10/16 0448 02/10/16 0822 02/10/16 0827  BP: 137/68 122/70 138/67 138/67  Pulse: 96 105 112 112  Temp: 97.6 F (36.4 C) 98.8 F (37.1 C)    TempSrc: Oral Oral    Resp: 18 16  16   Height:      Weight:      SpO2: 98% 96%  97%    Wt Readings from Last 3 Encounters:  02/03/16 64.4 kg (141 lb 15.6 oz)  07/15/15 64.411 kg (142 lb)  04/08/15 64.864 kg (143 lb)     Intake/Output Summary (Last 24 hours) at 02/10/16 1256 Last data filed at 02/10/16 1000  Gross per 24  hour  Intake 2902.5 ml  Output      0 ml  Net 2902.5 ml    Physical Exam:   GENERAL: Pleasant-appearing in no apparent distress.  HEAD, EYES, EARS, NOSE AND THROAT: Atraumatic, normocephalic. Extraocular muscles are intact. Pupils equal and reactive to light. Sclerae anicteric. No conjunctival injection. No oro-pharyngeal erythema.  NECK: Supple. There is no jugular venous distention. No bruits, no lymphadenopathy, no thyromegaly.  HEART: Regular rate and rhythm,. No murmurs, no rubs, no clicks.  LUNGS: Clear to auscultation bilaterally.  No rales or rhonchi. No wheezes.  ABDOMEN: Soft, flat, nontender, nondistended. Has good bowel sounds. No hepatosplenomegaly appreciated.  EXTREMITIES: Left BKA , improvement in the right leg cellulitis. Patient had angiogram yesterday. NEUROLOGIC: The patient is alert, awake, and oriented x3 with no focal motor or sensory deficits appreciated bilaterally.  SKIN: Moist and warm with no rashes appreciated.  Psych: Not anxious, depressed LN: No inguinal LN enlargement    Antibiotics   Anti-infectives    Start     Dose/Rate Route Frequency Ordered Stop   02/08/16 1600  vancomycin (VANCOCIN) IVPB 1000 mg/200 mL premix     1,000 mg 200 mL/hr over 60 Minutes Intravenous Every 12 hours 02/08/16 0452     02/07/16 0400  vancomycin (VANCOCIN) IVPB 750 mg/150 ml premix  Status:  Discontinued     750 mg 150 mL/hr over 60 Minutes Intravenous Every 12 hours 02/06/16 1813 02/08/16 0542   02/04/16 1500  vancomycin (VANCOCIN) IVPB 750 mg/150 ml premix  Status:  Discontinued     750 mg 150 mL/hr over 60 Minutes Intravenous Every 24 hours 02/03/16 2356 02/06/16 1812   02/04/16 0600  aztreonam (AZACTAM) 1 g in dextrose 5 % 50 mL IVPB     1 g 100 mL/hr over 30 Minutes Intravenous Every 8 hours 02/03/16 2356     02/04/16 0600  metroNIDAZOLE (FLAGYL) IVPB 500 mg     500 mg 100 mL/hr over 60 Minutes Intravenous Every 8 hours 02/03/16 2356     02/03/16 2100   vancomycin (VANCOCIN) IVPB 1000 mg/200 mL premix     1,000 mg 200 mL/hr over 60 Minutes Intravenous  Once 02/03/16 2047 02/03/16 2239   02/03/16 2100  aztreonam (AZACTAM) 2 g in dextrose 5 % 50 mL IVPB     2 g 100 mL/hr over 30 Minutes Intravenous  Once 02/03/16 2050 02/03/16 2309   02/03/16 2100  metroNIDAZOLE (FLAGYL) IVPB 500 mg     500 mg 100 mL/hr over 60 Minutes Intravenous  Once 02/03/16 2050 02/03/16 2239      Medications   Scheduled Meds: . apixaban  5 mg Oral BID  . aztreonam  1 g Intravenous Q8H  . digoxin  0.125 mg Oral Daily  . docusate sodium  200 mg Oral BID  . insulin aspart  0-5 Units Subcutaneous QHS  . insulin aspart  0-9 Units Subcutaneous TID WC  . metoprolol succinate  100 mg Oral BID  . metronidazole  500 mg Intravenous Q8H  . sodium chloride flush  3 mL Intravenous Q12H  . vancomycin  1,000 mg Intravenous Q12H   Continuous Infusions: . sodium chloride 75 mL/hr at 02/09/16 2041   PRN Meds:.acetaminophen **OR** acetaminophen, bisacodyl, magnesium hydroxide, ondansetron **OR** ondansetron (ZOFRAN) IV, oxyCODONE   Data Review:   Micro Results Recent Results (from the past 240 hour(s))  Urine culture     Status: None   Collection Time: 02/03/16  9:14 PM  Result Value Ref Range Status   Specimen Description URINE, RANDOM  Final   Special Requests Normal  Final   Culture NO GROWTH 2 DAYS  Final   Report Status 02/05/2016 FINAL  Final  Culture, blood (routine x 2)     Status: None   Collection Time: 02/03/16  9:15 PM  Result Value Ref Range Status   Specimen Description BLOOD LEFT HAND  Final   Special Requests BOTTLES DRAWN AEROBIC AND ANAEROBIC 10ML  Final   Culture NO GROWTH 5 DAYS  Final  Report Status 02/08/2016 FINAL  Final  Wound culture     Status: None   Collection Time: 02/03/16  9:26 PM  Result Value Ref Range Status   Specimen Description WOUND  Final   Special Requests Normal  Final   Gram Stain   Final    NO WBC SEEN RARE GRAM  POSITIVE COCCI FEW GRAM NEGATIVE RODS    Culture   Final    MODERATE GROWTH PROTEUS MIRABILIS MODERATE GROWTH STAPHYLOCOCCUS AUREUS MODERATE GROWTH ESCHERICHIA COLI    Report Status 02/07/2016 FINAL  Final   Organism ID, Bacteria ESCHERICHIA COLI  Final   Organism ID, Bacteria PROTEUS MIRABILIS  Final   Organism ID, Bacteria STAPHYLOCOCCUS AUREUS  Final      Susceptibility   Staphylococcus aureus - MIC*    CIPROFLOXACIN Value in next row Resistant      RESISTANT>=8    ERYTHROMYCIN Value in next row Resistant      RESISTANT>=8    GENTAMICIN Value in next row Sensitive      SENSITIVE<0.5    OXACILLIN Value in next row Sensitive      SENSITIVE0.5    TETRACYCLINE Value in next row Sensitive      SENSITIVE2    VANCOMYCIN Value in next row Sensitive      SENSITIVE1    TRIMETH/SULFA Value in next row Sensitive      SENSITIVE<=10    CLINDAMYCIN Value in next row Resistant      RESISTANT>=8    RIFAMPIN Value in next row Sensitive      SENSITIVE<=0.5    Inducible Clindamycin Value in next row Sensitive      SENSITIVE<=0.5    * MODERATE GROWTH STAPHYLOCOCCUS AUREUS   Escherichia coli - MIC*    AMPICILLIN Value in next row Resistant      SENSITIVE<=0.5    CEFAZOLIN Value in next row Sensitive      SENSITIVE<=0.5    CEFEPIME Value in next row Sensitive      SENSITIVE<=0.5    CEFTAZIDIME Value in next row Sensitive      SENSITIVE<=0.5    CEFTRIAXONE Value in next row Sensitive      SENSITIVE<=0.5    CIPROFLOXACIN Value in next row Resistant      SENSITIVE<=0.5    GENTAMICIN Value in next row Sensitive      SENSITIVE<=0.5    IMIPENEM Value in next row Sensitive      SENSITIVE<=0.5    TRIMETH/SULFA Value in next row Resistant      SENSITIVE<=0.5    AMPICILLIN/SULBACTAM Value in next row Intermediate      SENSITIVE<=0.5    PIP/TAZO Value in next row Sensitive      SENSITIVE<=0.5    Extended ESBL Value in next row Sensitive      SENSITIVE<=0.5    * MODERATE GROWTH  ESCHERICHIA COLI   Proteus mirabilis - MIC*    AMPICILLIN Value in next row Resistant      RESISTANT>=32    CEFAZOLIN Value in next row Sensitive      SENSITIVE<=4    CEFEPIME Value in next row Sensitive      SENSITIVE<=1    CEFTAZIDIME Value in next row Sensitive      SENSITIVE<=1    CEFTRIAXONE Value in next row Sensitive      SENSITIVE<=1    CIPROFLOXACIN Value in next row Sensitive      SENSITIVE1    GENTAMICIN Value in next row Resistant  RESISTANT>=16    IMIPENEM Value in next row Sensitive      SENSITIVE2    TRIMETH/SULFA Value in next row Resistant      RESISTANT>=320    AMPICILLIN/SULBACTAM Value in next row Sensitive      SENSITIVE8    PIP/TAZO Value in next row Sensitive      SENSITIVE<=4    * MODERATE GROWTH PROTEUS MIRABILIS  Anaerobic culture     Status: None   Collection Time: 02/03/16  9:26 PM  Result Value Ref Range Status   Specimen Description LEG  Final   Special Requests Normal  Final   Culture NO ANAEROBES ISOLATED  Final   Report Status 02/07/2016 FINAL  Final    Radiology Reports Dg Chest Port 1 View  02/03/2016  CLINICAL DATA:  Facial swelling and erythema. Reese's. Sudden onset this evening. EXAM: PORTABLE CHEST 1 VIEW COMPARISON:  12/26/2014 FINDINGS: A single AP portable view of the chest demonstrates no focal airspace consolidation or alveolar edema. The lungs are grossly clear. There is no large effusion or pneumothorax. There is unchanged cardiomegaly. Cardiac and mediastinal contours are otherwise unremarkable. IMPRESSION: Unchanged cardiomegaly.  No acute cardiopulmonary findings. Electronically Signed   By: Ellery Plunk M.D.   On: 02/03/2016 22:21   Dg Foot Complete Right  01/22/2016  CLINICAL DATA:  Nonhealing wound over the second toe of the right foot; images were obtained with foot in bandages. The patient has known diabetes and peripheral vascular disease. EXAM: RIGHT FOOT COMPLETE - 3+ VIEW COMPARISON:  Right foot series of Feb 11, 2015. FINDINGS: The bones of the right foot are diffusely osteopenic which appears more severe overall on today's study than on the study of 11 months ago. No definite loss of cortical bone is observed in the second toe. The joint spaces are preserved. The other phalanges appear intact as well as do the metatarsals. The bones of the hindfoot are unremarkable. There is soft tissue swelling of the second toe. IMPRESSION: Findings compatible with cellulitis of the second toe. No objective evidence of osteomyelitis but there is severe diffuse osteopenia which limits sensitivity of the study. Electronically Signed   By: David  [place] M.D.   On: 01/22/2016 16:22     CBC  Recent Labs Lab 02/03/16 2003 02/04/16 1610 02/06/16 0406 02/07/16 0417 02/09/16 0443 02/10/16 0439  WBC 25.7* 24.7* 11.9* 12.3* 15.0* 13.7*  HGB 10.3* 10.1* 9.6* 9.5* 9.6* 9.7*  HCT 30.3* 30.3* 28.2* 28.2* 28.8* 28.8*  PLT 199 178 238 274 379 444*  MCV 89.7 92.0 90.0 91.5 91.7 91.1  MCH 30.4 30.8 30.5 30.7 30.7 30.8  MCHC 33.9 33.5 33.8 33.5 33.5 33.8  RDW 17.2* 16.7* 16.7* 16.6* 16.9* 16.9*  LYMPHSABS 1.2  --   --   --   --   --   MONOABS 0.7  --   --   --   --   --   EOSABS 0.0  --   --   --   --   --   BASOSABS 0.1  --   --   --   --   --     Chemistries   Recent Labs Lab 02/03/16 2003 02/04/16 0638 02/05/16 1923 02/06/16 0406 02/07/16 0417 02/09/16 0443  NA 133* 138  --  133* 134* 133*  K 3.8 3.6  --  4.0 3.8 4.1  CL 97* 103  --  99* 101 98*  CO2 31 28  --  27 27 28  GLUCOSE 172* 103*  --  89 116* 99  BUN 37* 31*  --  32* 24* 23*  CREATININE 1.03* 0.75  --  0.77 0.62 0.58  CALCIUM 8.5* 8.1*  --  8.5* 8.4* 8.2*  MG  --   --  1.9  --   --   --   AST 36  --   --   --   --   --   ALT 17  --   --   --   --   --   ALKPHOS 73  --   --   --   --   --   BILITOT 0.8  --   --   --   --   --     ------------------------------------------------------------------------------------------------------------------ estimated creatinine clearance is 40.5 mL/min (by C-G formula based on Cr of 0.58). ------------------------------------------------------------------------------------------------------------------ No results for input(s): HGBA1C in the last 72 hours. ------------------------------------------------------------------------------------------------------------------ No results for input(s): CHOL, HDL, LDLCALC, TRIG, CHOLHDL, LDLDIRECT in the last 72 hours. ------------------------------------------------------------------------------------------------------------------ No results for input(s): TSH, T4TOTAL, T3FREE, THYROIDAB in the last 72 hours.  Invalid input(s): FREET3 ------------------------------------------------------------------------------------------------------------------ No results for input(s): VITAMINB12, FOLATE, FERRITIN, TIBC, IRON, RETICCTPCT in the last 72 hours.  Coagulation profile  Recent Labs Lab 02/03/16 2003  INR 2.03    No results for input(s): DDIMER in the last 72 hours.  Cardiac Enzymes  Recent Labs Lab 02/03/16 2003 02/04/16 0023 02/04/16 0638  TROPONINI 0.38* 0.25* 0.17*   ------------------------------------------------------------------------------------------------------------------ Invalid input(s): POCBNP    Assessment & Plan  Patient is a 82 year old white female being admitted with cellulitis  1.  Right lower extremity Cellulitis  : Admitted on May 9; wound cultures are showing Proteus, Escherichia coli, staph aureus. ID is consulted today for antibiotic duration and choice for discharge planning Patient with multiple allergies to antibiotics., Patient has history of peripheral vascular disease, seen by vascular, had right leg angiogram done, angioplasty is done for proximal right anterior tibial artery, mid right  posterior tibial artery Continue vancomycin and aztreonam and Flagyl. WBC  Is down from 25-13.7. No fever. Wound care consult for chronic lower extremity wound WBC trending down ,continue current antibiotics Patient seen by vascular surgery she will have arteriogram this week,. Wound culture showed Proteus, Escherichia coli, staph aureus. Wbc is coming down, Continue pain medicine.  2.  Diabetic foot ulcer (HCC) - wound care consulted.   3. Paroxysmal atrial fibrillation ; continue digoxin, Toprol-XL, and eliquis., tachycardic this morning,but received morning dig and toprol xl,will recheck  HR before adjusting meds , 4. Diabetes type 2; c;ontinue sliding scale insulin  overall stable .   5. PVD (peripheral vascular disease) (HCC) - continue home meds including anticoagulationvascular consult as abo  6.  HTN (hypertension) - continue therapy with Toprol   7. Chronic congestive heart failure type unknown  compensated continue oral Lasix  8. Elevated troponin due to demand ischemia  9. DVT prophylaxis patient RD on therapy with Eliquis  #10: Constipation: Continue Colace, and milk of magnesia, Dulcolax suppositories as needed  D/w daughter     Code Status Orders        Start     Ordered   02/04/16 0029  Do not attempt resuscitation (DNR)   Continuous    Question Answer Comment  In the event of cardiac or respiratory ARREST Do not call a "code blue"   In the event of cardiac or respiratory ARREST Do not perform Intubation, CPR, defibrillation or ACLS   In the  event of cardiac or respiratory ARREST Use medication by any route, position, wound care, and other measures to relive pain and suffering. May use oxygen, suction and manual treatment of airway obstruction as needed for comfort.      02/04/16 0028    Code Status History    Date Active Date Inactive Code Status Order ID Comments User Context   02/04/2016 12:17 AM 02/04/2016 12:28 AM Full Code [id number]  Oralia Manis, MD  Inpatient   07/15/2015  4:09 PM 07/15/2015  9:52 PM Full Code [id number]  Renford Dills, MD Inpatient   04/08/2015  9:38 AM 04/08/2015  3:24 PM Full Code [id number]  Renford Dills, MD Inpatient    Advance Directive Documentation        Most Recent Value   Type of Advance Directive  Out of facility DNR (pink MOST or yellow form)   Pre-existing out of facility DNR order (yellow form or pink MOST form)  Physician notified to receive inpatient order   "MOST" Form in Place?             Consults  Wound care nurse DVT Prophylaxis  Eliquis  Lab Results  Component Value Date   PLT 444* 02/10/2016     Time Spent in minutes   25 min  Greater than 50% of time spent in care coordination and counseling patient regarding the condition and plan of care.   Katha Hamming M.D on 02/10/2016 at 12:56 PM  Between 7am to 6pm - Pager - [phone]  After 6pm go to [email or web address] - password [email or web address] [facility]  [facility]   Office  [phone]

## 2016-02-10 NOTE — Care Management (Addendum)
Spoke with MD about LTAC and per MD patient will not be appropriate for LTAC. Patient may need PICC line and IV ABX. ID consult pending. She is already followed by [facility]. I have notified Clydie BraunKaren with [facility] that [facility] pharmacy will also follow patient. Per MD patient is wheelchair bound. Spoke with patient's daughter Lanora Manislizabeth she is concerned about giving IV antibiotics. Her sister is a nurse-in [place]. Daughter wants [facility] if IV ABX needed. CSW notified.

## 2016-02-10 NOTE — Progress Notes (Signed)
[facility]  Daily Progress Note   Subjective  - 1 Day Post-Op  Lots of somatic complaints, but no access site problems and leg is stable. No events overnight  Objective Filed Vitals:   02/09/16 2049 02/10/16 0448 02/10/16 0822 02/10/16 0827  BP: 137/68 122/70 138/67 138/67  Pulse: 96 105 112 112  Temp: 97.6 F (36.4 C) 98.8 F (37.1 C)    TempSrc: Oral Oral    Resp: 18 16  16   Height:      Weight:      SpO2: 98% 96%  97%    Intake/Output Summary (Last 24 hours) at 02/10/16 0911 Last data filed at 02/10/16 0600  Gross per 24 hour  Intake 2782.5 ml  Output      0 ml  Net 2782.5 ml    PULM  CTAB CV  RRR VASC  Foot warm, wounds stable  Laboratory CBC    Component Value Date/Time   WBC 13.7* 02/10/2016 0439   WBC 8.9 01/04/2015 0554   HGB 9.7* 02/10/2016 0439   HGB 7.3* 01/05/2015 0607   HCT 28.8* 02/10/2016 0439   HCT 23.9* 01/04/2015 0554   PLT 444* 02/10/2016 0439   PLT 254 01/04/2015 0554    BMET    Component Value Date/Time   NA 133* 02/09/2016 0443   NA 138 01/05/2015 0607   K 4.1 02/09/2016 0443   K 4.0 01/05/2015 0607   CL 98* 02/09/2016 0443   CL 103 01/05/2015 0607   CO2 28 02/09/2016 0443   CO2 31 01/05/2015 0607   GLUCOSE 99 02/09/2016 0443   GLUCOSE 104* 01/05/2015 0607   BUN 23* 02/09/2016 0443   BUN 15 01/05/2015 0607   CREATININE 0.58 02/09/2016 0443   CREATININE 0.63 01/05/2015 0607   CALCIUM 8.2* 02/09/2016 0443   CALCIUM 8.0* 01/05/2015 0607   GFRNONAA >60 02/09/2016 0443   GFRNONAA >60 01/05/2015 0607   GFRNONAA >60 10/31/2014 0326   GFRAA >60 02/09/2016 0443   GFRAA >60 01/05/2015 0607   GFRAA >60 10/31/2014 0326    Assessment/Planning: POD #1 s/p tibial angioplasty of the right leg   Foot warm, perfusion improved.  Complains about her BMs, her appetite, and somewhat her pain  No other recs from vascular POV.   Can follow up in office in 3-4 weeks with Raul DelKim Stegmayer, PA-C for wound  check    [name]  02/10/2016, 9:11 AM

## 2016-02-10 NOTE — Care Management Important Message (Signed)
Important Message  Patient Details  Name: Nolon NationsHelmi Liburd MRN: [id number] Date of Birth: 11/03/1920   Medicare Important Message Given:  Yes    Olegario MessierKathy A Ireland Chagnon 02/10/2016, 3:40 PM

## 2016-02-10 NOTE — Progress Notes (Addendum)
Pt with severe constipation.  Given bisacodyl earlier with minimal results.  Called Dr Patrecia PaceVachhini with order for lactulose 30 ml once.  Henriette CombsSarah Shaterrica Territo RN Pt with 3 very large solid stool with liquid stool and three bed changes.  Cleaned with incontinence spray, wipes, soap and water, applied barrier cream and antifungal to creases and groin bilaterally. Pt with moisture associated contact dermatitis and maceration to bilateral buttocks, gluteal fold and groin folds. Henriette CombsSarah Maymie Brunke RN

## 2016-02-10 NOTE — Clinical Social Work Note (Signed)
Clinical Social Work Assessment  Patient Details  Name: [name] MRN: [id number] Date of Birth: 08-09-1921  Date of referral:  02/10/16               Reason for consult:  Facility Placement                Permission sought to share information with:  Chartered certified accountant granted to share information::  Yes, Verbal Permission Granted  Name::      [name]::   [name]   Relationship::     Contact Information:     Housing/Transportation Living arrangements for the past 2 months:  [place] of Information:  Patient Patient Interpreter Needed:  None Criminal Activity/Legal Involvement Pertinent to Current Situation/Hospitalization:  No - Comment as needed Significant Relationships:  Adult Children Lives with:  Adult Children Do you feel safe going back to the place where you live?  Yes Need for family participation in patient care:  Yes (Comment)  Care giving concerns:  Patient lives with her son [name] and daughter in law [name] in [place].    Social Worker assessment / plan:  Holiday representative (CSW) received verbal consult from RN Case Manager that if patient requires IV Abx the family cannot manage that at home. CSW met with patient alone at bedside. Patient was alert and oriented and was laying in the bed. CSW introduced self and explained role of CSW department. Per patient she is wheel chair bound and lives in [place] with her son and daughter in [employer]. Patient's personal wheel chair was at bedside. CSW explained that patient may need IV Abx. Per patient she does not think she can manage IV Abx at home and requested to go to [facility]. Patient is agreeable to going home if she does not need IV Abx.   FL2 complete and faxed out. CSW will continue to follow and assist as needed.   Employment status:  Disabled (Comment on whether or not currently receiving Disability), Retired Forensic scientist:   Medicare PT Recommendations:  Not assessed at this time Information / Referral to community resources:  [place]  Patient/Family's Response to care:  Patient is agreeable to SNF search in case she requires IV Abx.   Patient/Family's Understanding of and Emotional Response to Diagnosis, Current Treatment, and Prognosis:  Patient was pleasant and thanked CSW for visit.   Emotional Assessment Appearance:  Appears stated age Attitude/Demeanor/Rapport:    Affect (typically observed):  Accepting, Adaptable, Pleasant Orientation:  Oriented to Self, Oriented to Place, Oriented to  Time, Oriented to Situation Alcohol / Substance use:  Not Applicable Psych involvement (Current and /or in the community):  No (Comment)  Discharge Needs  Concerns to be addressed:  Discharge Planning Concerns Readmission within the last 30 days:  No Current discharge risk:  Dependent with Mobility Barriers to Discharge:  Continued Medical Work up   Loralyn Freshwater, LCSW 02/10/2016, 2:54 PM

## 2016-02-10 NOTE — Progress Notes (Signed)
Pharmacy Antibiotic Note  [name] is a 82 y.o. female admitted on 02/03/2016 with cellulitis/diabetic foot with no evidence of osteo.  Pharmacy has been consulted for aztreonam, metronidazole, and vancomycin dosing.  Plan: Vancomycin trough 14 mcg/mL. Increase to 1 gm IV Q12H. Will recheck trough before fifth dose to target 15 to 20 mcg/mL. Pharmacy will continue to follow and adjust.  Vancomycin level resulted @ 61 mcg/ml; however trough level was drawn within one hour of infusion; therefore will reorder trough level @ 05:00 which is 12 hours post last dose.     Height: 5' 2.99" (160 cm) Weight: 141 lb 15.6 oz (64.4 kg) IBW/kg (Calculated) : 52.38  Temp (24hrs), Avg:98 F (36.7 C), Min:97.6 F (36.4 C), Max:98.8 F (37.1 C)   Recent Labs Lab 02/03/16 2003 02/03/16 2135 02/04/16 [id number] 02/06/16 0406  02/07/16 0417 02/08/16 0318 02/09/16 0443 02/10/16 0439 02/10/16 1818  WBC 25.7*  --  24.7* 11.9*  --  12.3*  --  15.0* 13.7*  --   CREATININE 1.03*  --  0.75 0.77  --  0.62  --  0.58  --   --   LATICACIDVEN  --  1.6  --   --   --   --   --   --   --   --   VANCOTROUGH  --   --   --   --   < >  --  14  --   --  61*  < > = values in this interval not displayed.  Estimated Creatinine Clearance: 40.5 mL/min (by C-G formula based on Cr of 0.58).    Allergies  Allergen Reactions  . Amoxicillin   . Cephalexin   . Prednisolone   . Prednisone   . Toradol [Ketorolac Tromethamine]   . Tramadol     Thank you for allowing pharmacy to be a part of this patient's care.  Naidelyn Parrella D, Pharm.D., BCPS Clinical Pharmacist 02/10/2016 7:26 PM

## 2016-02-10 NOTE — [place] FL2 (Signed)
[place] MEDICAID FL2 LEVEL OF CARE SCREENING TOOL     IDENTIFICATION  Patient Name: [name] Birthdate: 12/07/1920 Sex: female Admission Date (Current Location): 02/03/2016  [place]   Facility and Address:  [facility], [address], [place], [place] [address]      Provider Number: [id number]  Attending Physician Name and Address:  Katha HammingSnehalatha Konidena, MD  Relative Name and Phone Number:       Current Level of Care: Hospital Recommended Level of Care: Skilled Nursing Facility Prior Approval Number:    Date Approved/Denied:   PASRR Number:  ( [id number] )  Discharge Plan: SNF    Current Diagnoses: Patient Active Problem List   Diagnosis Date Noted  . Cellulitis 02/03/2016  . PVD (peripheral vascular disease) (HCC) 02/03/2016  . HTN (hypertension) 02/03/2016  . A-fib (HCC) 02/03/2016  . CHF (congestive heart failure) (HCC) 02/03/2016  . DM foot (HCC) 05/27/2015  . Ulcer of foot due to diabetes mellitus (HCC) 05/27/2015  . Diabetic foot ulcer (HCC) 05/27/2015    Orientation RESPIRATION BLADDER Height & Weight     Self, Time, Situation, Place  Normal Incontinent Weight: 141 lb 15.6 oz (64.4 kg) Height:  5' 2.99" (160 cm)  BEHAVIORAL SYMPTOMS/MOOD NEUROLOGICAL BOWEL NUTRITION STATUS   (none)  (none) Continent Diet (Diet: Heart Healthy/ Carb Modified )  AMBULATORY STATUS COMMUNICATION OF NEEDS Skin   Extensive Assist (Wheel Chair Bound ) Verbally Other (Comment) (Right Lower Leg: Cellulities )                       Personal Care Assistance Level of Assistance  Bathing, Feeding, Dressing Bathing Assistance: Limited assistance Feeding assistance: Independent Dressing Assistance: Limited assistance     Functional Limitations Info  Sight, Hearing, Speech Sight Info: Impaired Hearing Info: Adequate Speech Info: Adequate    SPECIAL CARE FACTORS FREQUENCY  PT (By licensed PT)     PT  Frequency:  (5)              Contractures      Additional Factors Info  Code Status, Allergies, Insulin Sliding Scale Code Status Info:  (DNR) Allergies Info:  (Amoxicillin, Cephalexin, Prednisolone, Prednisone, Toradol, Tramadol)   Insulin Sliding Scale Info:  (NovoLog Insulin Injections 3 times daily )       Current Medications (02/10/2016):  This is the current hospital active medication list Current Facility-Administered Medications  Medication Dose Route Frequency Provider Last Rate Last Dose  . 0.9 %  sodium chloride infusion   Intravenous Continuous Ranae PlumberKimberly A Stegmayer, PA-C 75 mL/hr at 02/09/16 2041    . acetaminophen (TYLENOL) tablet 650 mg  650 mg Oral Q6H PRN Oralia Manisavid Willis, MD   650 mg at 02/04/16 0348   Or  . acetaminophen (TYLENOL) suppository 650 mg  650 mg Rectal Q6H PRN Oralia Manisavid Willis, MD      . apixaban Everlene Balls(ELIQUIS) tablet 5 mg  5 mg Oral BID Oralia Manisavid Willis, MD   5 mg at 02/10/16 [id number]  . aztreonam (AZACTAM) 1 g in dextrose 5 % 50 mL IVPB  1 g Intravenous Q8H Oralia Manisavid Willis, MD   1 g at 02/10/16 1252  . bisacodyl (DULCOLAX) EC tablet 10 mg  10 mg Oral Daily PRN Katha HammingSnehalatha Konidena, MD   10 mg at 02/07/16 2031  . digoxin (LANOXIN) tablet 0.125 mg  0.125 mg Oral Daily Oralia Manisavid Willis, MD   0.125 mg at 02/10/16 [id number]  . docusate sodium (COLACE)  capsule 200 mg  200 mg Oral BID Auburn Bilberry, MD   200 mg at 02/10/16 0834  . insulin aspart (novoLOG) injection 0-5 Units  0-5 Units Subcutaneous QHS Oralia Manis, MD   0 Units at 02/04/16 0128  . insulin aspart (novoLOG) injection 0-9 Units  0-9 Units Subcutaneous TID WC Oralia Manis, MD   2 Units at 02/10/16 1251  . magnesium hydroxide (MILK OF MAGNESIA) suspension 30 mL  30 mL Oral Daily PRN Katha Hamming, MD   30 mL at 02/07/16 1309  . metoprolol succinate (TOPROL-XL) 24 hr tablet 100 mg  100 mg Oral BID Katha Hamming, MD   100 mg at 02/10/16 4696  . metroNIDAZOLE (FLAGYL) IVPB 500 mg  500 mg Intravenous Q8H Oralia Manis,  MD   500 mg at 02/10/16 1440  . nystatin (MYCOSTATIN) powder   Topical TID Katha Hamming, MD      . ondansetron [facility]) tablet 4 mg  4 mg Oral Q6H PRN Oralia Manis, MD       Or  . ondansetron [facility]) injection 4 mg  4 mg Intravenous Q6H PRN Oralia Manis, MD      . oxyCODONE (Oxy IR/ROXICODONE) immediate release tablet 5-10 mg  5-10 mg Oral Q4H PRN Oralia Manis, MD   10 mg at 02/10/16 0322  . sodium chloride flush (NS) 0.9 % injection 3 mL  3 mL Intravenous Q12H Oralia Manis, MD   3 mL at 02/10/16 1000  . vancomycin (VANCOCIN) IVPB 1000 mg/200 mL premix  1,000 mg Intravenous Q12H Katha Hamming, MD   1,000 mg at 02/10/16 2952     Discharge Medications: Please see discharge summary for a list of discharge medications.  Relevant Imaging Results:  Relevant Lab Results:   Additional Information  (SSN: [id number]. Patient will need IV ABX. )  Haig Prophet, LCSW

## 2016-02-11 ENCOUNTER — Encounter: Payer: Self-pay | Admitting: Vascular Surgery

## 2016-02-11 DIAGNOSIS — R000 Tachycardia, unspecified: Secondary | ICD-10-CM | POA: Diagnosis not present

## 2016-02-11 LAB — CREATININE, SERUM
CREATININE: 0.56 mg/dL (ref 0.44–1.00)
GFR calc Af Amer: >60 mL/min (ref >60)

## 2016-02-11 LAB — GLUCOSE, CAPILLARY
GLUCOSE-CAPILLARY: 107 mg/dL — AB (ref 65–99)
GLUCOSE-CAPILLARY: 109 mg/dL — AB (ref 65–99)
Glucose-Capillary: 125 mg/dL — ABNORMAL HIGH (ref 65–99)
Glucose-Capillary: 150 mg/dL — ABNORMAL HIGH (ref 65–99)

## 2016-02-11 LAB — VANCOMYCIN, TROUGH
VANCOMYCIN TR: 31 ug/mL — AB (ref 10–20)
Vancomycin Tr: 20 ug/mL (ref 10–20)

## 2016-02-11 MED ORDER — VANCOMYCIN HCL IN DEXTROSE 1-5 GM/200ML-% IV SOLN
1000.0000 mg | INTRAVENOUS | Status: DC
  Administered 2016-02-11: 18:00:00 1000 mg via INTRAVENOUS
  Filled 2016-02-11 (×2): qty 200

## 2016-02-11 MED ORDER — ENSURE ENLIVE PO LIQD
237.0000 mL | Freq: Two times a day (BID) | ORAL | Status: DC
  Administered 2016-02-11 – 2016-02-13 (×3): 237 mL via ORAL

## 2016-02-11 MED ORDER — DILTIAZEM HCL 25 MG/5ML IV SOLN
10.0000 mg | INTRAVENOUS | Status: DC | PRN
  Administered 2016-02-11: 05:00:00 10 mg via INTRAVENOUS
  Filled 2016-02-11 (×2): qty 5

## 2016-02-11 NOTE — Progress Notes (Signed)
[facility] - [place] at [facility]                                                                                                                                                                                            Patient Demographics   [name], is a 82 y.o. female, DOB - Feb 04, 1921, VWU:[id number]  Admit date - 02/03/2016   Admitting Physician Oralia Manis, MD  Outpatient Primary MD for the patient is DAVID Terance Hart, MD   LOS - 8  Subjective:has left leg pain.no fever.  Review of Systems:   CONSTITUTIONAL;no :Fever fatigue, weakness. No weight gain, no weight loss.  EYES: No blurry or double vision.  ENT: No tinnitus. No postnasal drip. No redness of the oropharynx.  RESPIRATORY: No cough, no wheeze, no hemoptysis. No dyspnea.  CARDIOVASCULAR: No chest pain. No orthopnea. No palpitations. No syncope.  GASTROINTESTINAL: No nausea, no vomiting or diarrhea. No abdominal pain. No melena or hematochezia.  GENITOURINARY: No dysuria or hematuria.  ENDOCRINE: No polyuria or nocturia. No heat or cold intolerance.  HEMATOLOGY: No anemia. No bruising. No bleeding.  INTEGUMENTARY: Right leg swelling and redness ,pain.  MUSCULOSKELETAL: No arthritis. No swelling. No gout.  NEUROLOGIC: No numbness, tingling, or ataxia. No seizure-type activity.  PSYCHIATRIC: No anxiety. No insomnia. No ADD.    Vitals:   Filed Vitals:   02/11/16 0454 02/11/16 0507 02/11/16 0855 02/11/16 0856  BP: 135/55 116/81 103/50   Pulse: 113 50 47 128  Temp:   97.9 F (36.6 C)   TempSrc:   Oral   Resp:      Height:      Weight:      SpO2: 95% 92% 93%     Wt Readings from Last 3 Encounters:  02/03/16 64.4 kg (141 lb 15.6 oz)  07/15/15 64.411 kg (142 lb)  04/08/15 64.864 kg (143 lb)     Intake/Output Summary (Last 24 hours) at 02/11/16 1147 Last data filed at 02/11/16 1005  Gross per 24 hour  Intake    390 ml  Output      0 ml  Net    390 ml    Physical Exam:    GENERAL: Pleasant-appearing in no apparent distress.  HEAD, EYES, EARS, NOSE AND THROAT: Atraumatic, normocephalic. Extraocular muscles are intact. Pupils equal and reactive to light. Sclerae anicteric. No conjunctival injection. No oro-pharyngeal erythema.  NECK: Supple. There is no jugular venous distention. No bruits, no lymphadenopathy, no thyromegaly.  HEART: Regular rate and rhythm,. No murmurs, no rubs, no clicks.  LUNGS: Clear to auscultation bilaterally. No rales or rhonchi. No wheezes.  ABDOMEN: Soft, flat, nontender, nondistended. Has good bowel sounds. No hepatosplenomegaly appreciated.  EXTREMITIES: Left BKA , improvement in the right leg cellulitis. Patient had angiogram  NEUROLOGIC: The patient is alert, awake, and oriented x3 with no focal motor or sensory deficits appreciated bilaterally.  SKIN: Moist and warm with no rashes appreciated.  Psych: Not anxious, depressed LN: No inguinal LN enlargement    Antibiotics   Anti-infectives    Start     Dose/Rate Route Frequency Ordered Stop   02/11/16 1800  vancomycin (VANCOCIN) IVPB 1000 mg/200 mL premix     1,000 mg 200 mL/hr over 60 Minutes Intravenous Every 24 hours 02/11/16 0453     02/08/16 1600  vancomycin (VANCOCIN) IVPB 1000 mg/200 mL premix  Status:  Discontinued     1,000 mg 200 mL/hr over 60 Minutes Intravenous Every 12 hours 02/08/16 0452 02/11/16 0453   02/07/16 0400  vancomycin (VANCOCIN) IVPB 750 mg/150 ml premix  Status:  Discontinued     750 mg 150 mL/hr over 60 Minutes Intravenous Every 12 hours 02/06/16 1813 02/08/16 0542   02/04/16 1500  vancomycin (VANCOCIN) IVPB 750 mg/150 ml premix  Status:  Discontinued     750 mg 150 mL/hr over 60 Minutes Intravenous Every 24 hours 02/03/16 2356 02/06/16 1812   02/04/16 0600  aztreonam (AZACTAM) 1 g in dextrose 5 % 50 mL IVPB     1 g 100 mL/hr over 30 Minutes Intravenous Every 8 hours 02/03/16 2356     02/04/16 0600  metroNIDAZOLE (FLAGYL) IVPB 500 mg     500  mg 100 mL/hr over 60 Minutes Intravenous Every 8 hours 02/03/16 2356     02/03/16 2100  vancomycin (VANCOCIN) IVPB 1000 mg/200 mL premix     1,000 mg 200 mL/hr over 60 Minutes Intravenous  Once 02/03/16 2047 02/03/16 2239   02/03/16 2100  aztreonam (AZACTAM) 2 g in dextrose 5 % 50 mL IVPB     2 g 100 mL/hr over 30 Minutes Intravenous  Once 02/03/16 2050 02/03/16 2309   02/03/16 2100  metroNIDAZOLE (FLAGYL) IVPB 500 mg     500 mg 100 mL/hr over 60 Minutes Intravenous  Once 02/03/16 2050 02/03/16 2239      Medications   Scheduled Meds: . apixaban  5 mg Oral BID  . aztreonam  1 g Intravenous Q8H  . digoxin  0.125 mg Oral Daily  . docusate sodium  200 mg Oral BID  . insulin aspart  0-5 Units Subcutaneous QHS  . insulin aspart  0-9 Units Subcutaneous TID WC  . metoprolol succinate  100 mg Oral BID  . metronidazole  500 mg Intravenous Q8H  . nystatin   Topical TID  . sodium chloride flush  3 mL Intravenous Q12H  . vancomycin  1,000 mg Intravenous Q24H   Continuous Infusions: . sodium chloride 75 mL/hr at 02/10/16 1620   PRN Meds:.acetaminophen **OR** acetaminophen, bisacodyl, diltiazem, magnesium hydroxide, ondansetron **OR** ondansetron (ZOFRAN) IV, oxyCODONE   Data Review:   Micro Results Recent Results (from the past 240 hour(s))  Urine culture     Status: None   Collection Time: 02/03/16  9:14 PM  Result Value Ref Range Status   Specimen Description URINE, RANDOM  Final   Special Requests Normal  Final   Culture NO GROWTH 2 DAYS  Final   Report Status 02/05/2016 FINAL  Final  Culture, blood (routine x 2)     Status: None   Collection Time: 02/03/16  9:15 PM  Result  Value Ref Range Status   Specimen Description BLOOD LEFT HAND  Final   Special Requests BOTTLES DRAWN AEROBIC AND ANAEROBIC  Final   Culture NO GROWTH 5 DAYS  Final   Report Status 02/08/2016 FINAL  Final  Wound culture     Status: None   Collection Time: 02/03/16  9:26 PM  Result Value Ref Range  Status   Specimen Description WOUND  Final   Special Requests Normal  Final   Gram Stain   Final    NO WBC SEEN RARE GRAM POSITIVE COCCI FEW GRAM NEGATIVE RODS    Culture   Final    MODERATE GROWTH PROTEUS MIRABILIS MODERATE GROWTH STAPHYLOCOCCUS AUREUS MODERATE GROWTH ESCHERICHIA COLI    Report Status 02/07/2016 FINAL  Final   Organism ID, Bacteria ESCHERICHIA COLI  Final   Organism ID, Bacteria PROTEUS MIRABILIS  Final   Organism ID, Bacteria STAPHYLOCOCCUS AUREUS  Final      Susceptibility   Staphylococcus aureus - MIC*    CIPROFLOXACIN Value in next row Resistant      RESISTANT>=8    ERYTHROMYCIN Value in next row Resistant      RESISTANT>=8    GENTAMICIN Value in next row Sensitive      SENSITIVE<0.5    OXACILLIN Value in next row Sensitive      SENSITIVE0.5    TETRACYCLINE Value in next row Sensitive      SENSITIVE2    VANCOMYCIN Value in next row Sensitive      SENSITIVE1    TRIMETH/SULFA Value in next row Sensitive      SENSITIVE<=10    CLINDAMYCIN Value in next row Resistant      RESISTANT>=8    RIFAMPIN Value in next row Sensitive      SENSITIVE<=0.5    Inducible Clindamycin Value in next row Sensitive      SENSITIVE<=0.5    * MODERATE GROWTH STAPHYLOCOCCUS AUREUS   Escherichia coli - MIC*    AMPICILLIN Value in next row Resistant      SENSITIVE<=0.5    CEFAZOLIN Value in next row Sensitive      SENSITIVE<=0.5    CEFEPIME Value in next row Sensitive      SENSITIVE<=0.5    CEFTAZIDIME Value in next row Sensitive      SENSITIVE<=0.5    CEFTRIAXONE Value in next row Sensitive      SENSITIVE<=0.5    CIPROFLOXACIN Value in next row Resistant      SENSITIVE<=0.5    GENTAMICIN Value in next row Sensitive      SENSITIVE<=0.5    IMIPENEM Value in next row Sensitive      SENSITIVE<=0.5    TRIMETH/SULFA Value in next row Resistant      SENSITIVE<=0.5    AMPICILLIN/SULBACTAM Value in next row Intermediate      SENSITIVE<=0.5    PIP/TAZO Value in next row  Sensitive      SENSITIVE<=0.5    Extended ESBL Value in next row Sensitive      SENSITIVE<=0.5    * MODERATE GROWTH ESCHERICHIA COLI   Proteus mirabilis - MIC*    AMPICILLIN Value in next row Resistant      RESISTANT>=32    CEFAZOLIN Value in next row Sensitive      SENSITIVE<=4    CEFEPIME Value in next row Sensitive      SENSITIVE<=1    CEFTAZIDIME Value in next row Sensitive      SENSITIVE<=1    CEFTRIAXONE Value in next row  Sensitive      SENSITIVE<=1    CIPROFLOXACIN Value in next row Sensitive      SENSITIVE1    GENTAMICIN Value in next row Resistant      RESISTANT>=16    IMIPENEM Value in next row Sensitive      SENSITIVE2    TRIMETH/SULFA Value in next row Resistant      RESISTANT>=320    AMPICILLIN/SULBACTAM Value in next row Sensitive      SENSITIVE8    PIP/TAZO Value in next row Sensitive      SENSITIVE<=4    * MODERATE GROWTH PROTEUS MIRABILIS  Anaerobic culture     Status: None   Collection Time: 02/03/16  9:26 PM  Result Value Ref Range Status   Specimen Description LEG  Final   Special Requests Normal  Final   Culture NO ANAEROBES ISOLATED  Final   Report Status 02/07/2016 FINAL  Final    Radiology Reports Dg Chest Port 1 View  02/03/2016  CLINICAL DATA:  Facial swelling and erythema. Reese's. Sudden onset this evening. EXAM: PORTABLE CHEST 1 VIEW COMPARISON:  12/26/2014 FINDINGS: A single AP portable view of the chest demonstrates no focal airspace consolidation or alveolar edema. The lungs are grossly clear. There is no large effusion or pneumothorax. There is unchanged cardiomegaly. Cardiac and mediastinal contours are otherwise unremarkable. IMPRESSION: Unchanged cardiomegaly.  No acute cardiopulmonary findings. Electronically Signed   By: Ellery Plunk M.D.   On: 02/03/2016 22:21   Dg Foot Complete Right  01/22/2016  CLINICAL DATA:  Nonhealing wound over the second toe of the right foot; images were obtained with foot in bandages. The patient has  known diabetes and peripheral vascular disease. EXAM: RIGHT FOOT COMPLETE - 3+ VIEW COMPARISON:  Right foot series of Feb 11, 2015. FINDINGS: The bones of the right foot are diffusely osteopenic which appears more severe overall on today's study than on the study of 11 months ago. No definite loss of cortical bone is observed in the second toe. The joint spaces are preserved. The other phalanges appear intact as well as do the metatarsals. The bones of the hindfoot are unremarkable. There is soft tissue swelling of the second toe. IMPRESSION: Findings compatible with cellulitis of the second toe. No objective evidence of osteomyelitis but there is severe diffuse osteopenia which limits sensitivity of the study. Electronically Signed   By: David  [place] M.D.   On: 01/22/2016 16:22     CBC  Recent Labs Lab 02/06/16 0406 02/07/16 0417 02/09/16 0443 02/10/16 0439  WBC 11.9* 12.3* 15.0* 13.7*  HGB 9.6* 9.5* 9.6* 9.7*  HCT 28.2* 28.2* 28.8* 28.8*  PLT 238 274 379 444*  MCV 90.0 91.5 91.7 91.1  MCH 30.5 30.7 30.7 30.8  MCHC 33.8 33.5 33.5 33.8  RDW 16.7* 16.6* 16.9* 16.9*    Chemistries   Recent Labs Lab 02/05/16 1923 02/06/16 0406 02/07/16 0417 02/09/16 0443 02/11/16 0407  NA  --  133* 134* 133*  --   K  --  4.0 3.8 4.1  --   CL  --  99* 101 98*  --   CO2  --  27 27 28   --   GLUCOSE  --  89 116* 99  --   BUN  --  32* 24* 23*  --   CREATININE  --  0.77 0.62 0.58 0.56  CALCIUM  --  8.5* 8.4* 8.2*  --   MG 1.9  --   --   --   --    ------------------------------------------------------------------------------------------------------------------  estimated creatinine clearance is 40.5 mL/min (by C-G formula based on Cr of 0.56). ------------------------------------------------------------------------------------------------------------------ No results for input(s): HGBA1C in the last 72  hours. ------------------------------------------------------------------------------------------------------------------ No results for input(s): CHOL, HDL, LDLCALC, TRIG, CHOLHDL, LDLDIRECT in the last 72 hours. ------------------------------------------------------------------------------------------------------------------ No results for input(s): TSH, T4TOTAL, T3FREE, THYROIDAB in the last 72 hours.  Invalid input(s): FREET3 ------------------------------------------------------------------------------------------------------------------ No results for input(s): VITAMINB12, FOLATE, FERRITIN, TIBC, IRON, RETICCTPCT in the last 72 hours.  Coagulation profile No results for input(s): INR, PROTIME in the last 168 hours.  No results for input(s): DDIMER in the last 72 hours.  Cardiac Enzymes No results for input(s): CKMB, TROPONINI, MYOGLOBIN in the last 168 hours.  Invalid input(s): CK ------------------------------------------------------------------------------------------------------------------ Invalid input(s): POCBNP    Assessment & Plan  Patient is a 82 year old white female being admitted with cellulitis  1.  Right lower extremity Cellulitis  : Admitted on May 9; wound cultures are showing Proteus, Escherichia coli, staph aureus. ID is consulted today for antibiotic duration and choice for discharge planning Patient with multiple allergies to antibiotics., Patient has history of peripheral vascular disease, seen by vascular, had right leg angiogram done, angioplasty is done for proximal right anterior tibial artery, mid right posterior tibial artery Continue vancomycin and aztreonam and Flagyl. WBC  Is down from 25-13.7. No fever. Wound care consult for chronic lower extremity wound WBC trending down ,continue current antibiotics oob to chair today,wheel  Chair bound at baseline. Patient seen by vascular surgery she will have arteriogram this week,. Wound culture showed  Proteus, Escherichia coli, staph aureus. Wbc is coming down, Continue pain medicine.  2.  Diabetic foot ulcer (HCC) - wound care consulted.   3. Paroxysmal atrial fibrillation ; continue digoxin, Toprol-XL, and eliquis., tachycardic this morning,but received morning dig and toprol xl,will recheck  HR before adjusting meds , 4. Diabetes type 2; c;ontinue sliding scale insulin  overall stable .   5. PVD (peripheral vascular disease) (HCC) - continue home meds including anticoagulationvascular consult as abo  6.  HTN (hypertension) - continue therapy with Toprol   7. Chronic congestive heart failure type unknown  compensated continue oral Lasix  8. Elevated troponin due to demand ischemia  9. DVT prophylaxis patient RD on therapy with Eliquis  #10: Constipation: Continue Colace, and milk of magnesia, Dulcolax suppositories as needed  D/w daughter     Code Status Orders        Start     Ordered   02/04/16 0029  Do not attempt resuscitation (DNR)   Continuous    Question Answer Comment  In the event of cardiac or respiratory ARREST Do not call a "code blue"   In the event of cardiac or respiratory ARREST Do not perform Intubation, CPR, defibrillation or ACLS   In the event of cardiac or respiratory ARREST Use medication by any route, position, wound care, and other measures to relive pain and suffering. May use oxygen, suction and manual treatment of airway obstruction as needed for comfort.      02/04/16 0028    Code Status History    Date Active Date Inactive Code Status Order ID Comments User Context   02/04/2016 12:17 AM 02/04/2016 12:28 AM Full Code [id number]  Oralia Manis, MD Inpatient   07/15/2015  4:09 PM 07/15/2015  9:52 PM Full Code [id number]  Renford Dills, MD Inpatient   04/08/2015  9:38 AM 04/08/2015  3:24 PM Full Code [id number]  Renford Dills, MD Inpatient    Advance Directive Documentation  Most Recent Value   Type of Advance Directive  Out of  facility DNR (pink MOST or yellow form)   Pre-existing out of facility DNR order (yellow form or pink MOST form)  Physician notified to receive inpatient order   "MOST" Form in Place?             Consults  Wound care nurse DVT Prophylaxis  Eliquis  Lab Results  Component Value Date   PLT 444* 02/10/2016     Time Spent in minutes   25 min  Greater than 50% of time spent in care coordination and counseling patient regarding the condition and plan of care.   Katha Hamming M.D on 02/11/2016 at 11:47 AM  Between 7am to 6pm - Pager - [phone]  After 6pm go to [email or web address] - password [email or web address] [facility]  [facility]   Office  [phone]

## 2016-02-11 NOTE — Care Management (Signed)
Discussed plan of care with attending.  ID consult is pending.  Patient remains on IV antibiotics and duration of this therapy is pending ID consult.  Patient does not have a PICC line.  Possible discharge dispositions are return home with resumption of  home health with or without IVT or skilled nursing.  A bed search has been initiated

## 2016-02-11 NOTE — Progress Notes (Signed)
Pharmacy Antibiotic Note  Bibiana [name] is a 82 y.o. female admitted on 02/03/2016 with cellulitis/diabetic foot with no evidence of osteo.  Pharmacy has been consulted for aztreonam, metronidazole, and vancomycin dosing.  Plan: Vancomycin trough 14 mcg/mL. Increase to 1 gm IV Q12H. Will recheck trough before fifth dose to target 15 to 20 mcg/mL. Pharmacy will continue to follow and adjust.  Vancomycin level resulted @ 61 mcg/ml; however trough level was drawn within one hour of infusion; therefore will reorder trough level @ 05:00 which is 12 hours post last dose.   5/17 AM vanc level 31. Changed to 1 gram q 24 hours. Level before next dose is due to ensure clearance.    Height: 5' 2.99" (160 cm) Weight: 141 lb 15.6 oz (64.4 kg) IBW/kg (Calculated) : 52.38  Temp (24hrs), Avg:97.8 F (36.6 C), Min:97.7 F (36.5 C), Max:97.9 F (36.6 C)   Recent Labs Lab 02/04/16 0638 02/06/16 0406  02/07/16 0417  02/09/16 0443 02/10/16 0439 02/10/16 1818 02/11/16 0407  WBC 24.7* 11.9*  --  12.3*  --  15.0* 13.7*  --   --   CREATININE 0.75 0.77  --  0.62  --  0.58  --   --  0.56  VANCOTROUGH  --   --   < >  --   < >  --   --  61* 31*  < > = values in this interval not displayed.  Estimated Creatinine Clearance: 40.5 mL/min (by C-G formula based on Cr of 0.56).    Allergies  Allergen Reactions  . Amoxicillin   . Cephalexin   . Prednisolone   . Prednisone   . Toradol [Ketorolac Tromethamine]   . Tramadol     Thank you for allowing pharmacy to be a part of this patient's care.  Elon Lomeli S, Pharm.D., BCPS Clinical Pharmacist 02/11/2016 4:54 AM

## 2016-02-11 NOTE — Progress Notes (Signed)
Initial Nutrition Assessment  DOCUMENTATION CODES:   Not applicable  INTERVENTION:   -Will recommend liberalizing diet order and trying Dysphagia III diet order for ease of eating meats and softer fruits/vegetables -Will also recommend Ensure Enlive po BID, each supplement provides 350 kcal and 20 grams of protein -Continue to provide encouragement at meal times as pt a feeder/requires assistance, discussed with Nsg -Recommend new weight, last weight on admission   NUTRITION DIAGNOSIS: Ca  Inadequate oral intake related to poor appetite as evidenced by  (dietitian consult, per Nsg report).  GOAL:   Patient will meet greater than or equal to 90% of their needs  MONITOR:   PO intake  REASON FOR ASSESSMENT:   Consult Poor PO  ASSESSMENT:   Pt admitted with cellulitis, pt with h/o PVD and s/p tibial angioplasty of the right leg.  I&D consulted. Pt with severe constipatioh however now resolved with bowel regimen.   Past Medical History  Diagnosis Date  . Peripheral vascular disease (HCC)   . Dysrhythmia   . Diabetes mellitus without complication (HCC)   . Hypertension   . CHF (congestive heart failure) (HCC)   . Atrial fibrillation (HCC)   . Gout   . Foot ulcer, right (HCC)   . Gangrene of toe (HCC)     Diet Order:  Diet heart healthy/carb modified Room service appropriate?: Yes; Fluid consistency:: Thin    Pt sleeping on her side on visit. Upon entering room, pt looked at RD and went back to sleep after multiple attempts to wake pt.   Per Nsg pt eating very poorly since admission. CNA Chelsie reports a few days ago pt would not eat any of chicken, mashed potatoes and carrots. CNA reports today daughter-in-law fed pt however RN reporting pt not eating hardly anything. No family present at bedside.    Medications: NS at 7475mL/hr, SS novolog, Colace Labs:  Na 133, Mg WDL, K WDL   Gastrointestinal Profile: multiple loose stools s/p bowel regimen Last BM:   02/11/2016   Nutrition-Focused Physical Exam Findings:  Unable to complete Nutrition-Focused physical exam at this time.    Weight Change: Per [facility] weight trends pt weight stable   Skin:   (cellulitis of right leg)   Height:   Ht Readings from Last 1 Encounters:  02/03/16 5' 2.99" (1.6 m)    Weight:   Wt Readings from Last 1 Encounters:  02/03/16 141 lb 15.6 oz (64.4 kg)    Wt Readings from Last 10 Encounters:  02/03/16 141 lb 15.6 oz (64.4 kg)  07/15/15 142 lb (64.411 kg)  04/08/15 143 lb (64.864 kg)    BMI:  Body mass index is 25.16 kg/(m^2).  Estimated Nutritional Needs:   Kcal:  1600-1920kcals  Protein:  76-90g protein  Fluid:  1.7-1.9L fluid  EDUCATION NEEDS:   Education needs no appropriate at this time  Leda QuailAllyson Candela Krul, RD, LDN Pager [phone] Weekend/On-Call Pager [phone]

## 2016-02-11 NOTE — Progress Notes (Signed)
Receiving several calls from central tele that pt HR elevating to 140-159.  Called Dr Tobi BastosPyreddy and received order for Cardizem 10 mg ivp q 4 hours for HR greater than 110.  HR fluctuating and reads in 90's to 120's.  Gave 10 mg Cardizem ivp slow over 2 minutes and continue to monitor. Henriette CombsSarah Braxton Weisbecker RN

## 2016-02-11 NOTE — Clinical Social Work Note (Signed)
CSW met with pt and daughter in law, Liz, to provide bed offers. Pt and daughter in law chose, [facility]. In the even pt will need IV ABX, pt will got to [facility]. If pt need PO ABX, pt will return home. Awaiting determination of ABX course. CSW updated facility on potential discharge plan. CSW will continue to follow.   Sarah McNulty, MSW, LCSW  Clinical Social Worker  [phone] 

## 2016-02-11 NOTE — Progress Notes (Signed)
BP 97/47 during day, 109/59 now.  HR varying from 47 to 128.  Spoke with Dr Clint GuyHower and will hold Toprol XL 100 mg this evening.  Pt also more lethargic tonight than last night. Henriette CombsSarah Dynisha Due RN

## 2016-02-11 NOTE — Progress Notes (Signed)
Pharmacy Antibiotic Note  [name] is a 82 y.o. female admitted on 02/03/2016 with cellulitis/diabetic foot with no evidence of osteo.  Pharmacy has been consulted for aztreonam, metronidazole, and vancomycin dosing.  Plan: Vancomycin trough 14 mcg/mL. Increase to 1 gm IV Q12H. Will recheck trough before fifth dose to target 15 to 20 mcg/mL. Pharmacy will continue to follow and adjust.  Vancomycin level resulted @ 61 mcg/ml; however trough level was drawn within one hour of infusion; therefore will reorder trough level @ 05:00 which is 12 hours post last dose.   5/17 AM vanc level 31. Changed to 1 gram q 24 hours. Level before next dose is due to ensure clearance.  5/17 1736 VT=20 Continue vanc 1 g q 24 hours. Per ID- recommended transitioning to oral meds, cipro and doxy     Height: 5' 2.99" (160 cm) Weight: 141 lb 15.6 oz (64.4 kg) IBW/kg (Calculated) : 52.38  Temp (24hrs), Avg:97.9 F (36.6 C), Min:97.7 F (36.5 C), Max:97.9 F (36.6 C)   Recent Labs Lab 02/06/16 0406  02/07/16 0417  02/09/16 0443 02/10/16 0439  02/11/16 0407 02/11/16 1736  WBC 11.9*  --  12.3*  --  15.0* 13.7*  --   --   --   CREATININE 0.77  --  0.62  --  0.58  --   --  0.56  --   VANCOTROUGH  --   < >  --   < >  --   --   < > 31* 20  < > = values in this interval not displayed.  Estimated Creatinine Clearance: 40.5 mL/min (by C-G formula based on Cr of 0.56).    Allergies  Allergen Reactions  . Amoxicillin   . Cephalexin   . Prednisolone   . Prednisone   . Toradol [Ketorolac Tromethamine]   . Tramadol     Thank you for allowing pharmacy to be a part of this patient's care.  Olene FlossMelissa D Joya Willmott, Pharm.D., BCPS Clinical Pharmacist 02/11/2016 7:02 PM

## 2016-02-11 NOTE — Consult Note (Signed)
[facility] Infectious Disease     Reason for Consult: LE cellulitis and wound    Referring Physician: Vianne Bulls,  Date of Admission:  02/03/2016   Principal Problem:   Cellulitis Active Problems:   Diabetic foot ulcer ([place])   PVD (peripheral vascular disease) (HCC)   HTN (hypertension)   A-fib (HCC)   CHF (congestive heart failure) (HCC)   HPI: [name] is a 82 y.o. female with PVD and lymphedema admitted 5/9 with worsening of chronic RLE ulcer. Had been following with wound center but wound cellulitis progressed over 3 days prior to admission. On admission wbc 25K.  She underwent 5/15 tibial angioplasty.  Treated with IV aztreonam, flagyl and vanco and wbc down to 13.  Wound cx growing MSSA, Proteus and E coli, She is allergic to amox, keflex but she cannot recall the reaction. History is limited from the patient  Past Medical History  Diagnosis Date  . Peripheral vascular disease ([place])   . Dysrhythmia   . Diabetes mellitus without complication ([place])   . Hypertension   . CHF (congestive heart failure) ([place])   . Atrial fibrillation ([place])   . Gout   . Foot ulcer, right ([place])   . Gangrene of toe [facility])    Past Surgical History  Procedure Laterality Date  . Leg amputation through knee Left   . Peripheral vascular catheterization Right 04/08/2015    Procedure: Lower Extremity Angiography;  Surgeon: Katha Cabal, MD;  Location: [place] [facility];  Service: Cardiovascular;  Laterality: Right;  . Peripheral vascular catheterization  04/08/2015    Procedure: Lower Extremity Intervention;  Surgeon: Katha Cabal, MD;  Location: [place] [facility];  Service: Cardiovascular;;  . Joint replacement    . Right knee replacement    . Eye surgery    . Peripheral vascular catheterization N/A 07/15/2015    Procedure: Abdominal Aortogram w/Lower Extremity;  Surgeon: Katha Cabal, MD;  Location: [place] [facility];  Service: Cardiovascular;  Laterality: N/A;  .  Peripheral vascular catheterization  07/15/2015    Procedure: Lower Extremity Intervention;  Surgeon: Katha Cabal, MD;  Location: [place] [facility];  Service: Cardiovascular;;  . Peripheral vascular catheterization Right 02/09/2016    Procedure: Lower Extremity Angiography;  Surgeon: Algernon Huxley, MD;  Location: [place] [facility];  Service: Cardiovascular;  Laterality: Right;  . Peripheral vascular catheterization  02/09/2016    Procedure: Lower Extremity Intervention;  Surgeon: Algernon Huxley, MD;  Location: [place] [facility];  Service: Cardiovascular;;   Social History  Substance Use Topics  . Smoking status: Former Smoker    Quit date: 12/02/1954  . Smokeless tobacco: None  . Alcohol Use: No   History reviewed. No pertinent family history.  Allergies:  Allergies  Allergen Reactions  . Amoxicillin   . Cephalexin   . Prednisolone   . Prednisone   . Toradol [Ketorolac Tromethamine]   . Tramadol     Current antibiotics: Antibiotics Given (last 72 hours)    Date/Time Action Medication Dose Rate   02/08/16 1540 Given   vancomycin (VANCOCIN) IVPB 1000 mg/200 mL premix 1,000 mg 200 mL/hr   02/08/16 2037 Given  [Pt preference]   metroNIDAZOLE (FLAGYL) IVPB 500 mg 500 mg 100 mL/hr   02/08/16 2038 Given  [Pt preference]   aztreonam (AZACTAM) 1 g in dextrose 5 % 50 mL IVPB 1 g 100 mL/hr   02/09/16 0450 Given   vancomycin (VANCOCIN) IVPB 1000 mg/200 mL premix  1,000 mg 200 mL/hr   02/09/16 0549 Given   aztreonam (AZACTAM) 1 g in dextrose 5 % 50 mL IVPB 1 g 100 mL/hr   02/09/16 0610 Given   metroNIDAZOLE (FLAGYL) IVPB 500 mg 500 mg 100 mL/hr   02/09/16 1442 Given   aztreonam (AZACTAM) 1 g in dextrose 5 % 50 mL IVPB 1 g 100 mL/hr   02/09/16 1442 Given   metroNIDAZOLE (FLAGYL) IVPB 500 mg 500 mg 100 mL/hr   02/09/16 1946 Given   vancomycin (VANCOCIN) IVPB 1000 mg/200 mL premix 1,000 mg 200 mL/hr   02/09/16 2102 Given   aztreonam (AZACTAM) 1 g in dextrose 5 % 50 mL IVPB 1  g 100 mL/hr   02/09/16 2142 Given   metroNIDAZOLE (FLAGYL) IVPB 500 mg 500 mg 100 mL/hr   02/10/16 0519 Given   aztreonam (AZACTAM) 1 g in dextrose 5 % 50 mL IVPB 1 g 100 mL/hr   02/10/16 0549 Given   metroNIDAZOLE (FLAGYL) IVPB 500 mg 500 mg 100 mL/hr   02/10/16 1017 Given   vancomycin (VANCOCIN) IVPB 1000 mg/200 mL premix 1,000 mg 200 mL/hr   02/10/16 1252 Given   aztreonam (AZACTAM) 1 g in dextrose 5 % 50 mL IVPB 1 g 100 mL/hr   02/10/16 1440 Given   metroNIDAZOLE (FLAGYL) IVPB 500 mg 500 mg 100 mL/hr   02/10/16 1717 Given   vancomycin (VANCOCIN) IVPB 1000 mg/200 mL premix 1,000 mg 200 mL/hr   02/10/16 2156 Given   aztreonam (AZACTAM) 1 g in dextrose 5 % 50 mL IVPB 1 g 100 mL/hr   02/10/16 2240 Given   metroNIDAZOLE (FLAGYL) IVPB 500 mg 500 mg 100 mL/hr   02/11/16 0505 Given   aztreonam (AZACTAM) 1 g in dextrose 5 % 50 mL IVPB 1 g 100 mL/hr   02/11/16 0537 Given   metroNIDAZOLE (FLAGYL) IVPB 500 mg 500 mg 100 mL/hr   02/11/16 1440 Given   aztreonam (AZACTAM) 1 g in dextrose 5 % 50 mL IVPB 1 g 100 mL/hr   02/11/16 1518 Given   metroNIDAZOLE (FLAGYL) IVPB 500 mg 500 mg 100 mL/hr      MEDICATIONS: . apixaban  5 mg Oral BID  . aztreonam  1 g Intravenous Q8H  . digoxin  0.125 mg Oral Daily  . docusate sodium  200 mg Oral BID  . feeding supplement (ENSURE ENLIVE)  237 mL Oral BID BM  . insulin aspart  0-5 Units Subcutaneous QHS  . insulin aspart  0-9 Units Subcutaneous TID WC  . metoprolol succinate  100 mg Oral BID  . metronidazole  500 mg Intravenous Q8H  . nystatin   Topical TID  . sodium chloride flush  3 mL Intravenous Q12H  . vancomycin  1,000 mg Intravenous Q24H    Review of Systems - 11 systems reviewed and negative per HPI   OBJECTIVE: Temp:  [97.7 F (36.5 C)-97.9 F (36.6 C)] 97.7 F (36.5 C) (05/17 1428) Pulse Rate:  [47-128] 116 (05/17 1428) Resp:  [18-20] 20 (05/17 0443) BP: (97-136)/(47-96) 97/47 mmHg (05/17 1428) SpO2:  [92 %-95 %] 95 % (05/17  1428) Physical Exam  Constitutional:  Frail, elderly HENT: [place]/AT, PERRLA, no scleral icterus Mouth/Throat: Oropharynx is clear and moist.  Cardiovascular: Normal rate, regular rhythm and normal heart sounds. Pulmonary/Chest: Effort normal and breath sounds normal. No respiratory distress.  has no wheezes.  Abdominal: Soft. Bowel sounds are normal.  exhibits no distension. There is no tenderness.  Lymphadenopathy: no cervical adenopathy. No axillary  adenopathy Neurological: sleepy RLE with 1+ edema.  Skin: post calf with linear ulcers with some mild surrounding erythema and mild exudate. Psychiatric: a normal mood and affect.  behavior is normal.    LABS: Results for orders placed or performed during the hospital encounter of 02/03/16 (from the past 48 hour(s))  Glucose, capillary     Status: Abnormal   Collection Time: 02/09/16  6:21 PM  Result Value Ref Range   Glucose-Capillary 103 (H) 65 - 99 mg/dL  Glucose, capillary     Status: Abnormal   Collection Time: 02/09/16  8:49 PM  Result Value Ref Range   Glucose-Capillary 195 (H) 65 - 99 mg/dL  CBC     Status: Abnormal   Collection Time: 02/10/16  4:39 AM  Result Value Ref Range   WBC 13.7 (H) 3.6 - 11.0 K/uL   RBC 3.17 (L) 3.80 - 5.20 MIL/uL   Hemoglobin 9.7 (L) 12.0 - 16.0 g/dL   HCT 28.8 (L) 35.0 - 47.0 %   MCV 91.1 80.0 - 100.0 fL   MCH 30.8 26.0 - 34.0 pg   MCHC 33.8 32.0 - 36.0 g/dL   RDW 16.9 (H) 11.5 - 14.5 %   Platelets 444 (H) 150 - 440 K/uL  Glucose, capillary     Status: Abnormal   Collection Time: 02/10/16  7:42 AM  Result Value Ref Range   Glucose-Capillary 148 (H) 65 - 99 mg/dL  Glucose, capillary     Status: Abnormal   Collection Time: 02/10/16 11:07 AM  Result Value Ref Range   Glucose-Capillary 194 (H) 65 - 99 mg/dL  Glucose, capillary     Status: Abnormal   Collection Time: 02/10/16 12:35 PM  Result Value Ref Range   Glucose-Capillary 159 (H) 65 - 99 mg/dL  Glucose, capillary     Status: Abnormal    Collection Time: 02/10/16  4:23 PM  Result Value Ref Range   Glucose-Capillary 159 (H) 65 - 99 mg/dL  Vancomycin, trough     Status: Abnormal   Collection Time: 02/10/16  6:18 PM  Result Value Ref Range   Vancomycin Tr 61 (HH) 10 - 20 ug/mL    Comment: RESULT CONFIRMED BY MANUAL DILUTION CRITICAL RESULT CALLED TO, READ BACK BY AND VERIFIED WITH TELDRIN JAMES AT 8242 02/10/16 MLZ   Glucose, capillary     Status: Abnormal   Collection Time: 02/10/16  9:50 PM  Result Value Ref Range   Glucose-Capillary 151 (H) 65 - 99 mg/dL  Vancomycin, trough     Status: Abnormal   Collection Time: 02/11/16  4:07 AM  Result Value Ref Range   Vancomycin Tr 31 (HH) 10 - 20 ug/mL    Comment: CRITICAL RESULT CALLED TO, READ BACK BY AND VERIFIED WITH MATT MCBANE ON 02/11/16 AT 0445 BY TLB   Creatinine, serum     Status: None   Collection Time: 02/11/16  4:07 AM  Result Value Ref Range   Creatinine, Ser 0.56 0.44 - 1.00 mg/dL   GFR calc non Af Amer >60 >60 mL/min   GFR calc Af Amer >60 >60 mL/min    Comment: (NOTE) The eGFR has been calculated using the CKD EPI equation. This calculation has not been validated in all clinical situations. eGFR's persistently <60 mL/min signify possible Chronic Kidney Disease.   Glucose, capillary     Status: Abnormal   Collection Time: 02/11/16  7:59 AM  Result Value Ref Range   Glucose-Capillary 125 (H) 65 - 99 mg/dL  Glucose, capillary  Status: Abnormal   Collection Time: 02/11/16 11:17 AM  Result Value Ref Range   Glucose-Capillary 107 (H) 65 - 99 mg/dL   No components found for: ESR, C REACTIVE PROTEIN MICRO: Recent Results (from the past 720 hour(s))  Urine culture     Status: None   Collection Time: 02/03/16  9:14 PM  Result Value Ref Range Status   Specimen Description URINE, RANDOM  Final   Special Requests Normal  Final   Culture NO GROWTH 2 DAYS  Final   Report Status 02/05/2016 FINAL  Final  Culture, blood (routine x 2)     Status: None    Collection Time: 02/03/16  9:15 PM  Result Value Ref Range Status   Specimen Description BLOOD LEFT HAND  Final   Special Requests BOTTLES DRAWN AEROBIC AND ANAEROBIC 10ML  Final   Culture NO GROWTH 5 DAYS  Final   Report Status 02/08/2016 FINAL  Final  Wound culture     Status: None   Collection Time: 02/03/16  9:26 PM  Result Value Ref Range Status   Specimen Description WOUND  Final   Special Requests Normal  Final   Gram Stain   Final    NO WBC SEEN RARE GRAM POSITIVE COCCI FEW GRAM NEGATIVE RODS    Culture   Final    MODERATE GROWTH PROTEUS MIRABILIS MODERATE GROWTH STAPHYLOCOCCUS AUREUS MODERATE GROWTH ESCHERICHIA COLI    Report Status 02/07/2016 FINAL  Final   Organism ID, Bacteria ESCHERICHIA COLI  Final   Organism ID, Bacteria PROTEUS MIRABILIS  Final   Organism ID, Bacteria STAPHYLOCOCCUS AUREUS  Final      Susceptibility   Staphylococcus aureus - MIC*    CIPROFLOXACIN Value in next row Resistant      RESISTANT>=8    ERYTHROMYCIN Value in next row Resistant      RESISTANT>=8    GENTAMICIN Value in next row Sensitive      SENSITIVE<0.5    OXACILLIN Value in next row Sensitive      SENSITIVE0.5    TETRACYCLINE Value in next row Sensitive      SENSITIVE2    VANCOMYCIN Value in next row Sensitive      SENSITIVE1    TRIMETH/SULFA Value in next row Sensitive      SENSITIVE<=10    CLINDAMYCIN Value in next row Resistant      RESISTANT>=8    RIFAMPIN Value in next row Sensitive      SENSITIVE<=0.5    Inducible Clindamycin Value in next row Sensitive      SENSITIVE<=0.5    * MODERATE GROWTH STAPHYLOCOCCUS AUREUS   Escherichia coli - MIC*    AMPICILLIN Value in next row Resistant      SENSITIVE<=0.5    CEFAZOLIN Value in next row Sensitive      SENSITIVE<=0.5    CEFEPIME Value in next row Sensitive      SENSITIVE<=0.5    CEFTAZIDIME Value in next row Sensitive      SENSITIVE<=0.5    CEFTRIAXONE Value in next row Sensitive      SENSITIVE<=0.5    CIPROFLOXACIN  Value in next row Resistant      SENSITIVE<=0.5    GENTAMICIN Value in next row Sensitive      SENSITIVE<=0.5    IMIPENEM Value in next row Sensitive      SENSITIVE<=0.5    TRIMETH/SULFA Value in next row Resistant      SENSITIVE<=0.5    AMPICILLIN/SULBACTAM Value in next row Intermediate  SENSITIVE<=0.5    PIP/TAZO Value in next row Sensitive      SENSITIVE<=0.5    Extended ESBL Value in next row Sensitive      SENSITIVE<=0.5    * MODERATE GROWTH ESCHERICHIA COLI   Proteus mirabilis - MIC*    AMPICILLIN Value in next row Resistant      RESISTANT>=32    CEFAZOLIN Value in next row Sensitive      SENSITIVE<=4    CEFEPIME Value in next row Sensitive      SENSITIVE<=1    CEFTAZIDIME Value in next row Sensitive      SENSITIVE<=1    CEFTRIAXONE Value in next row Sensitive      SENSITIVE<=1    CIPROFLOXACIN Value in next row Sensitive      SENSITIVE1    GENTAMICIN Value in next row Resistant      RESISTANT>=16    IMIPENEM Value in next row Sensitive      SENSITIVE2    TRIMETH/SULFA Value in next row Resistant      RESISTANT>=320    AMPICILLIN/SULBACTAM Value in next row Sensitive      SENSITIVE8    PIP/TAZO Value in next row Sensitive      SENSITIVE<=4    * MODERATE GROWTH PROTEUS MIRABILIS  Anaerobic culture     Status: None   Collection Time: 02/03/16  9:26 PM  Result Value Ref Range Status   Specimen Description LEG  Final   Special Requests Normal  Final   Culture NO ANAEROBES ISOLATED  Final   Report Status 02/07/2016 FINAL  Final    IMAGING: Dg Chest Port 1 View  02/03/2016  CLINICAL DATA:  Facial swelling and erythema. Reese's. Sudden onset this evening. EXAM: PORTABLE CHEST 1 VIEW COMPARISON:  12/26/2014 FINDINGS: A single AP portable view of the chest demonstrates no focal airspace consolidation or alveolar edema. The lungs are grossly clear. There is no large effusion or pneumothorax. There is unchanged cardiomegaly. Cardiac and mediastinal contours are  otherwise unremarkable. IMPRESSION: Unchanged cardiomegaly.  No acute cardiopulmonary findings. Electronically Signed   By: Andreas Newport M.D.   On: 02/03/2016 22:21   Dg Foot Complete Right  01/22/2016  CLINICAL DATA:  Nonhealing wound over the second toe of the right foot; images were obtained with foot in bandages. The patient has known diabetes and peripheral vascular disease. EXAM: RIGHT FOOT COMPLETE - 3+ VIEW COMPARISON:  Right foot series of Feb 11, 2015. FINDINGS: The bones of the right foot are diffusely osteopenic which appears more severe overall on today's study than on the study of 11 months ago. No definite loss of cortical bone is observed in the second toe. The joint spaces are preserved. The other phalanges appear intact as well as do the metatarsals. The bones of the hindfoot are unremarkable. There is soft tissue swelling of the second toe. IMPRESSION: Findings compatible with cellulitis of the second toe. No objective evidence of osteomyelitis but there is severe diffuse osteopenia which limits sensitivity of the study. Electronically Signed   By: Shauntay Brunelli  [place] M.D.   On: 01/22/2016 16:22    Assessment:   [name] is a 82 y.o. female with PAD, R post calf chronic ulceration admitted with cellulitis, now s/p revascularization. She has received 9 days of abx with vanco, aztreonam and flagyl and wound is improving.  SHe is listed as pcn and keflex allergic but unclear reaction  GIven her allergies to pcn and cephalosporins and the resistance pattern of the mixed  infection it is difficult to provide an oral regimen to cover all organisms.  It is also difficult to know what was the cause of the cellulitis vs what is colonization of the wound.  The wound seems to be improving after 9 days of IV abx.  Recommendations Cipro 500 po bid for 7 more  days (will cover the proteus) Doxy 100 po bid for 7 more days (will cover the Staph aureus and may cover the E coli but not tested) She  should follow up with the wound care center who can repeat wound cultures if worsening and fax to me at my office if assistance needed in abx management. No need for her to come see me in clinic unless wound care center requests it given mobility issues.   Thank you very much for allowing me to participate in the care of this patient. Please call with questions.   Cheral Marker. Ola Spurr, MD

## 2016-02-12 LAB — GASTROINTESTINAL PANEL BY PCR, STOOL (REPLACES STOOL CULTURE)
ADENOVIRUS F40/41: NOT DETECTED
Astrovirus: NOT DETECTED
CAMPYLOBACTER SPECIES: NOT DETECTED
Cryptosporidium: NOT DETECTED
Cyclospora cayetanensis: NOT DETECTED
E. coli O157: NOT DETECTED
ENTEROAGGREGATIVE E COLI (EAEC): NOT DETECTED
ENTEROPATHOGENIC E COLI (EPEC): NOT DETECTED
ENTEROTOXIGENIC E COLI (ETEC): NOT DETECTED
Entamoeba histolytica: NOT DETECTED
GIARDIA LAMBLIA: NOT DETECTED
NOROVIRUS GI/GII: NOT DETECTED
PLESIMONAS SHIGELLOIDES: NOT DETECTED
Rotavirus A: NOT DETECTED
SHIGELLA/ENTEROINVASIVE E COLI (EIEC): NOT DETECTED
Salmonella species: NOT DETECTED
Sapovirus (I, II, IV, and V): NOT DETECTED
Shiga like toxin producing E coli (STEC): NOT DETECTED
Vibrio cholerae: NOT DETECTED
Vibrio species: NOT DETECTED
Yersinia enterocolitica: NOT DETECTED

## 2016-02-12 LAB — C DIFFICILE QUICK SCREEN W PCR REFLEX
C DIFFICLE (CDIFF) ANTIGEN: NEGATIVE
C Diff interpretation: NEGATIVE
C Diff toxin: NEGATIVE

## 2016-02-12 LAB — GLUCOSE, CAPILLARY
GLUCOSE-CAPILLARY: 90 mg/dL (ref 65–99)
GLUCOSE-CAPILLARY: 178 mg/dL — AB (ref 65–99)
Glucose-Capillary: 148 mg/dL — ABNORMAL HIGH (ref 65–99)
Glucose-Capillary: 165 mg/dL — ABNORMAL HIGH (ref 65–99)

## 2016-02-12 MED ORDER — METOPROLOL SUCCINATE ER 50 MG PO TB24: 50.0000 mg | ORAL_TABLET | Freq: Two times a day (BID) | ORAL | Status: DC

## 2016-02-12 MED ORDER — DOXYCYCLINE HYCLATE 100 MG PO TABS
100.0000 mg | ORAL_TABLET | Freq: Two times a day (BID) | ORAL | Status: DC
  Administered 2016-02-12 – 2016-02-13 (×3): 100 mg via ORAL
  Filled 2016-02-12 (×3): qty 1

## 2016-02-12 MED ORDER — METOPROLOL TARTRATE 50 MG PO TABS
50.0000 mg | ORAL_TABLET | Freq: Two times a day (BID) | ORAL | Status: DC
  Administered 2016-02-12 – 2016-02-13 (×2): 50 mg via ORAL
  Filled 2016-02-12 (×2): qty 1

## 2016-02-12 MED ORDER — CIPROFLOXACIN HCL 500 MG PO TABS
500.0000 mg | ORAL_TABLET | Freq: Two times a day (BID) | ORAL | Status: DC
  Administered 2016-02-12 – 2016-02-13 (×3): 500 mg via ORAL
  Filled 2016-02-12 (×3): qty 1

## 2016-02-12 NOTE — Progress Notes (Addendum)
Pt is alert with confusion, family visiting at bedside, pain experienced only with movement, denies need for pain intervention, bed bound, IV fluids discontinued, wound care evaluated skin break down on bottom, special bed ordered for patient, multiple bm throughout shift negative for c. Diff., barrier cream applied with each bowel movement, [facility].

## 2016-02-12 NOTE — Plan of Care (Signed)
Problem: Bowel/Gastric: Goal: Will not experience complications related to bowel motility Outcome: Progressing BM this AM  Problem: Skin Integrity: Goal: Skin integrity will improve Outcome: Not Progressing Continues to have significant moisture associated contact dermititis.  Used incontinence spray with each bowel or bladder movement, barrier cream and nystatin powder.

## 2016-02-12 NOTE — Care Management Important Message (Signed)
Important Message  Patient Details  Name: [name] NationsHelmi [name] MRN: [id number] Date of Birth: 08/12/1921   Medicare Important Message Given:  Yes    Eber HongGreene, Hodge Stachnik R, RN 02/12/2016, 9:50 AM

## 2016-02-12 NOTE — Progress Notes (Signed)
[facility] INFECTIOUS DISEASE PROGRESS NOTE Date of Admission:  02/03/2016     ID: [name] is a 82 y.o. female with PVD and leg ulceration   Principal Problem:   Cellulitis Active Problems:   Diabetic foot ulcer (HCC)   PVD (peripheral vascular disease) (HCC)   HTN (hypertension)   A-fib (HCC)   CHF (congestive heart failure) (HCC)   Subjective: Still with pain, no fevers  ROS  Unable to obtain  Medications:  Antibiotics Given (last 72 hours)    Date/Time Action Medication Dose Rate   02/09/16 1442 Given   aztreonam (AZACTAM) 1 g in dextrose 5 % 50 mL IVPB 1 g 100 mL/hr   02/09/16 1442 Given   metroNIDAZOLE (FLAGYL) IVPB 500 mg 500 mg 100 mL/hr   02/09/16 1946 Given   vancomycin (VANCOCIN) IVPB 1000 mg/200 mL premix 1,000 mg 200 mL/hr   02/09/16 2102 Given   aztreonam (AZACTAM) 1 g in dextrose 5 % 50 mL IVPB 1 g 100 mL/hr   02/09/16 2142 Given   metroNIDAZOLE (FLAGYL) IVPB 500 mg 500 mg 100 mL/hr   02/10/16 0519 Given   aztreonam (AZACTAM) 1 g in dextrose 5 % 50 mL IVPB 1 g 100 mL/hr   02/10/16 0549 Given   metroNIDAZOLE (FLAGYL) IVPB 500 mg 500 mg 100 mL/hr   02/10/16 0643 Given   vancomycin (VANCOCIN) IVPB 1000 mg/200 mL premix 1,000 mg 200 mL/hr   02/10/16 1252 Given   aztreonam (AZACTAM) 1 g in dextrose 5 % 50 mL IVPB 1 g 100 mL/hr   02/10/16 1440 Given   metroNIDAZOLE (FLAGYL) IVPB 500 mg 500 mg 100 mL/hr   02/10/16 1717 Given   vancomycin (VANCOCIN) IVPB 1000 mg/200 mL premix 1,000 mg 200 mL/hr   02/10/16 2156 Given   aztreonam (AZACTAM) 1 g in dextrose 5 % 50 mL IVPB 1 g 100 mL/hr   02/10/16 2240 Given   metroNIDAZOLE (FLAGYL) IVPB 500 mg 500 mg 100 mL/hr   02/11/16 0505 Given   aztreonam (AZACTAM) 1 g in dextrose 5 % 50 mL IVPB 1 g 100 mL/hr   02/11/16 0537 Given   metroNIDAZOLE (FLAGYL) IVPB 500 mg 500 mg 100 mL/hr   02/11/16 1440 Given   aztreonam (AZACTAM) 1 g in dextrose 5 % 50 mL IVPB 1 g 100 mL/hr   02/11/16 1518 Given   metroNIDAZOLE  (FLAGYL) IVPB 500 mg 500 mg 100 mL/hr   02/11/16 1801 Given   vancomycin (VANCOCIN) IVPB 1000 mg/200 mL premix 1,000 mg 200 mL/hr   02/11/16 2128 Given   metroNIDAZOLE (FLAGYL) IVPB 500 mg 500 mg 100 mL/hr   02/11/16 2301 Given   aztreonam (AZACTAM) 1 g in dextrose 5 % 50 mL IVPB 1 g 100 mL/hr   02/12/16 0506 Given   aztreonam (AZACTAM) 1 g in dextrose 5 % 50 mL IVPB 1 g 100 mL/hr   02/12/16 0532 Given   metroNIDAZOLE (FLAGYL) IVPB 500 mg 500 mg 100 mL/hr     . apixaban  5 mg Oral BID  . aztreonam  1 g Intravenous Q8H  . digoxin  0.125 mg Oral Daily  . docusate sodium  200 mg Oral BID  . feeding supplement (ENSURE ENLIVE)  237 mL Oral BID BM  . insulin aspart  0-5 Units Subcutaneous QHS  . insulin aspart  0-9 Units Subcutaneous TID WC  . metoprolol succinate  100 mg Oral BID  . metronidazole  500 mg Intravenous Q8H  . nystatin   Topical  TID  . sodium chloride flush  3 mL Intravenous Q12H  . vancomycin  1,000 mg Intravenous Q24H    Objective: Vital signs in last 24 hours: Temp:  [97.5 F (36.4 C)-98.2 F (36.8 C)] 98.2 F (36.8 C) (05/18 0449) Pulse Rate:  [69-126] 69 (05/18 0449) Resp:  [16-20] 16 (05/18 0449) BP: (97-115)/(47-78) 115/78 mmHg (05/18 0449) SpO2:  [95 %-100 %] 98 % (05/18 0449) Weight:  [81.103 kg (178 lb 12.8 oz)] 81.103 kg (178 lb 12.8 oz) (05/18 0449) Constitutional: Frail, elderly HENT: [place]/AT, PERRLA, no scleral icterus Mouth/Throat: Oropharynx is clear and moist.  Cardiovascular: Normal rate, regular rhythm and normal heart sounds. Pulmonary/Chest: Effort normal and breath sounds normal. No respiratory distress. has no wheezes.  Abdominal: Soft. Bowel sounds are normal. exhibits no distension. There is no tenderness.  Lymphadenopathy: no cervical adenopathy. No axillary adenopathy Neurological: sleepy RLE with 1+ edema.  Skin: post calf with linear ulcers with some mild surrounding erythema and mild exudate. Psychiatric: a normal mood and  affect. behavior is normal.   Lab Results  Recent Labs  02/10/16 0439 02/11/16 0407  WBC 13.7*  --   HGB 9.7*  --   HCT 28.8*  --   CREATININE  --  0.56    Microbiology: Results for orders placed or performed during the hospital encounter of 02/03/16  Urine culture     Status: None   Collection Time: 02/03/16  9:14 PM  Result Value Ref Range Status   Specimen Description URINE, RANDOM  Final   Special Requests Normal  Final   Culture NO GROWTH 2 DAYS  Final   Report Status 02/05/2016 FINAL  Final  Culture, blood (routine x 2)     Status: None   Collection Time: 02/03/16  9:15 PM  Result Value Ref Range Status   Specimen Description BLOOD LEFT HAND  Final   Special Requests BOTTLES DRAWN AEROBIC AND ANAEROBIC  Final   Culture NO GROWTH 5 DAYS  Final   Report Status 02/08/2016 FINAL  Final  Wound culture     Status: None   Collection Time: 02/03/16  9:26 PM  Result Value Ref Range Status   Specimen Description WOUND  Final   Special Requests Normal  Final   Gram Stain   Final    NO WBC SEEN RARE GRAM POSITIVE COCCI FEW GRAM NEGATIVE RODS    Culture   Final    MODERATE GROWTH PROTEUS MIRABILIS MODERATE GROWTH STAPHYLOCOCCUS AUREUS MODERATE GROWTH ESCHERICHIA COLI    Report Status 02/07/2016 FINAL  Final   Organism ID, Bacteria ESCHERICHIA COLI  Final   Organism ID, Bacteria PROTEUS MIRABILIS  Final   Organism ID, Bacteria STAPHYLOCOCCUS AUREUS  Final      Susceptibility   Staphylococcus aureus - MIC*    CIPROFLOXACIN Value in next row Resistant      RESISTANT>=8    ERYTHROMYCIN Value in next row Resistant      RESISTANT>=8    GENTAMICIN Value in next row Sensitive      SENSITIVE<0.5    OXACILLIN Value in next row Sensitive      SENSITIVE0.5    TETRACYCLINE Value in next row Sensitive      SENSITIVE2    VANCOMYCIN Value in next row Sensitive      SENSITIVE1    TRIMETH/SULFA Value in next row Sensitive      SENSITIVE<=10    CLINDAMYCIN Value in next  row Resistant      RESISTANT>=8  RIFAMPIN Value in next row Sensitive      SENSITIVE<=0.5    Inducible Clindamycin Value in next row Sensitive      SENSITIVE<=0.5    * MODERATE GROWTH STAPHYLOCOCCUS AUREUS   Escherichia coli - MIC*    AMPICILLIN Value in next row Resistant      SENSITIVE<=0.5    CEFAZOLIN Value in next row Sensitive      SENSITIVE<=0.5    CEFEPIME Value in next row Sensitive      SENSITIVE<=0.5    CEFTAZIDIME Value in next row Sensitive      SENSITIVE<=0.5    CEFTRIAXONE Value in next row Sensitive      SENSITIVE<=0.5    CIPROFLOXACIN Value in next row Resistant      SENSITIVE<=0.5    GENTAMICIN Value in next row Sensitive      SENSITIVE<=0.5    IMIPENEM Value in next row Sensitive      SENSITIVE<=0.5    TRIMETH/SULFA Value in next row Resistant      SENSITIVE<=0.5    AMPICILLIN/SULBACTAM Value in next row Intermediate      SENSITIVE<=0.5    PIP/TAZO Value in next row Sensitive      SENSITIVE<=0.5    Extended ESBL Value in next row Sensitive      SENSITIVE<=0.5    * MODERATE GROWTH ESCHERICHIA COLI   Proteus mirabilis - MIC*    AMPICILLIN Value in next row Resistant      RESISTANT>=32    CEFAZOLIN Value in next row Sensitive      SENSITIVE<=4    CEFEPIME Value in next row Sensitive      SENSITIVE<=1    CEFTAZIDIME Value in next row Sensitive      SENSITIVE<=1    CEFTRIAXONE Value in next row Sensitive      SENSITIVE<=1    CIPROFLOXACIN Value in next row Sensitive      SENSITIVE1    GENTAMICIN Value in next row Resistant      RESISTANT>=16    IMIPENEM Value in next row Sensitive      SENSITIVE2    TRIMETH/SULFA Value in next row Resistant      RESISTANT>=320    AMPICILLIN/SULBACTAM Value in next row Sensitive      SENSITIVE8    PIP/TAZO Value in next row Sensitive      SENSITIVE<=4    * MODERATE GROWTH PROTEUS MIRABILIS  Anaerobic culture     Status: None   Collection Time: 02/03/16  9:26 PM  Result Value Ref Range Status   Specimen  Description LEG  Final   Special Requests Normal  Final   Culture NO ANAEROBES ISOLATED  Final   Report Status 02/07/2016 FINAL  Final    Studies/Results: No results found.  Assessment/Plan: Mattalyn Tome is a 82 y.o. female with PAD, R post calf chronic ulceration admitted with cellulitis, now s/p revascularization. She has received 9 days of abx with vanco, aztreonam and flagyl and wound is improving. SHe is listed as pcn and keflex allergic but unclear reaction  GIven her allergies to pcn and cephalosporins and the resistance pattern of the mixed infection it is difficult to provide an oral regimen to cover all organisms. It is also difficult to know what was the cause of the cellulitis vs what is colonization of the wound.  The wound seems to be improving after 9 days of IV abx.  Recommendations Cipro 500 po bid for 7 more days (will cover the proteus) Doxy 100 po bid for  7 more days (will cover the Staph aureus and may cover the E coli but not tested) She should follow up with the wound care center who can repeat wound cultures if worsening and fax to me at my office if assistance needed in abx management. No need for her to come see me in clinic unless wound care center requests it given mobility issues. Thank you very much for the consult. Will follow with you.  Samy Ryner P   02/12/2016, 10:49 AM

## 2016-02-12 NOTE — Consult Note (Signed)
[facility] wound consult note Reason for Consult:Moisture Associated Skin Damage to sacrum, bilateral gluteal folds.  Loose stools.  Intense pain with incontinence care and ongoing severe right lower leg pain.   Wound type:MASD to sacrum and bilateral gluteal folds from stool incontinence.   Pressure Ulcer POA: N/A Measurement:3 lesions to right lower leg.  2 cm x 1 cm x 0.2 cm ruddy red Sacrum/buttocks 10 cm x 4 cm bilateral buttocks erythema and denuded skin.  Very tender to touch.  Very loose stools present and care provided. Pink moist wound bed.  Wound ZOX:WRUEAbed:ruddy red with some fibrin slough present. Leg  Drainage (amount, consistency, odor) Minimal serosanguinous to leg.  Periwound:Inense erythema to right lower leg.  Using silver hydrofiber antimicrobial dressings and systemic antibiotic therapy.  Will begin moisture barrier cream, no disposable pads or briefs.  Will order a bed with low air loss feature.  Dressing procedure/placement/frequency:Cleanse buttocks with soap and water.  Apply barrier cream twice daily and PRN incontinence.  NO disposable briefs or underpads.   Continue Aquacel AG orders to right leg.  Will not follow at this time.  Please re-consult if needed.  Maple HudsonKaren Shelly Spenser RN BSN [facility] Pager [phone]

## 2016-02-12 NOTE — Progress Notes (Signed)
[facility] [place] [facility]                                                                                                                                                                                            Patient Demographics   [name], is a 82 y.o. female, DOB - Oct 20, 1920, ZOX:[id number]  Admit date - 02/03/2016   Admitting Physician Oralia Manis, MD  Outpatient Primary MD for the patient is DAVID Terance Hart, MD   LOS - 9  Subjective:Patient has diarrhea, stool incontinence, continued right leg pain, pain in the back. Has buttock erythema, tenderness to touch. Seen by wound care nurse.  Review of Systems:   CONSTITUTIONAL;no :Fever fatigue, weakness. No weight gain, no weight loss.  EYES: No blurry or double vision.  ENT: No tinnitus. No postnasal drip. No redness of the oropharynx.  RESPIRATORY: No cough, no wheeze, no hemoptysis. No dyspnea.  CARDIOVASCULAR: No chest pain. No orthopnea. No palpitations. No syncope.  GASTROINTESTINAL: No nausea, no vomiting .Has loose stools. No abdominal pain. No melena or hematochezia.  GENITOURINARY: No dysuria or hematuria.  ENDOCRINE: No polyuria or nocturia. No heat or cold intolerance.  HEMATOLOGY: No anemia. No bruising. No bleeding.  INTEGUMENTARY: Right leg swelling and redness ,pain. Has a erythema and denuded skin in the buttock area MUSCULOSKELETAL: No arthritis. No swelling. No gout.  NEUROLOGIC: No numbness, tingling, or ataxia. No seizure-type activity.  PSYCHIATRIC: No anxiety. No insomnia. No ADD.    Vitals:   Filed Vitals:   02/11/16 1428 02/11/16 2015 02/11/16 2109 02/12/16 0449  BP: 97/47 109/59 113/70 115/78  Pulse: 116 85 126 69  Temp: 97.7 F (36.5 C)  97.5 F (36.4 C) 98.2 F (36.8 C)  TempSrc: Oral  Oral Oral  Resp:   20 16  Height:      Weight:    81.103 kg (178 lb 12.8 oz)  SpO2: 95% 100% 96% 98%    Wt Readings from Last 3 Encounters:  02/12/16 81.103 kg (178  lb 12.8 oz)  07/15/15 64.411 kg (142 lb)  04/08/15 64.864 kg (143 lb)     Intake/Output Summary (Last 24 hours) at 02/12/16 1048 Last data filed at 02/12/16 0900  Gross per 24 hour  Intake    360 ml  Output      0 ml  Net    360 ml    Physical Exam:   GENERAL: Pleasant-appearing in no apparent distress.  HEAD, EYES, EARS, NOSE AND THROAT: Atraumatic, normocephalic. Extraocular muscles are intact. Pupils equal and reactive to light. Sclerae anicteric. No conjunctival injection. No oro-pharyngeal erythema.  NECK: Supple. There is no jugular venous distention. No bruits, no lymphadenopathy, no thyromegaly.  HEART: Regular rate and rhythm,. No murmurs, no rubs, no clicks.  LUNGS: Clear to auscultation bilaterally. No rales or rhonchi. No wheezes.  ABDOMEN: Soft, flat, nontender, nondistended. Has good bowel sounds. No hepatosplenomegaly appreciated.  EXTREMITIES: Left BKA , improvement in the right leg cellulitis. Patient had angiogram  NEUROLOGIC: The patient is alert, awake, and oriented x3 with no focal motor or sensory deficits appreciated bilaterally.  SKIN: M and has decreased erythema in the right leg compared to when she came. Also has diaper rash with erythema in the sacral area. Psych: Not anxious, depressed LN: No inguinal LN enlargement    Antibiotics   Anti-infectives    Start     Dose/Rate Route Frequency Ordered Stop   02/11/16 1800  vancomycin (VANCOCIN) IVPB 1000 mg/200 mL premix     1,000 mg 200 mL/hr over 60 Minutes Intravenous Every 24 hours 02/11/16 0453     02/08/16 1600  vancomycin (VANCOCIN) IVPB 1000 mg/200 mL premix  Status:  Discontinued     1,000 mg 200 mL/hr over 60 Minutes Intravenous Every 12 hours 02/08/16 0452 02/11/16 0453   02/07/16 0400  vancomycin (VANCOCIN) IVPB 750 mg/150 ml premix  Status:  Discontinued     750 mg 150 mL/hr over 60 Minutes Intravenous Every 12 hours 02/06/16 1813 02/08/16 0542   02/04/16 1500  vancomycin (VANCOCIN) IVPB  750 mg/150 ml premix  Status:  Discontinued     750 mg 150 mL/hr over 60 Minutes Intravenous Every 24 hours 02/03/16 2356 02/06/16 1812   02/04/16 0600  aztreonam (AZACTAM) 1 g in dextrose 5 % 50 mL IVPB     1 g 100 mL/hr over 30 Minutes Intravenous Every 8 hours 02/03/16 2356     02/04/16 0600  metroNIDAZOLE (FLAGYL) IVPB 500 mg     500 mg 100 mL/hr over 60 Minutes Intravenous Every 8 hours 02/03/16 2356     02/03/16 2100  vancomycin (VANCOCIN) IVPB 1000 mg/200 mL premix     1,000 mg 200 mL/hr over 60 Minutes Intravenous  Once 02/03/16 2047 02/03/16 2239   02/03/16 2100  aztreonam (AZACTAM) 2 g in dextrose 5 % 50 mL IVPB     2 g 100 mL/hr over 30 Minutes Intravenous  Once 02/03/16 2050 02/03/16 2309   02/03/16 2100  metroNIDAZOLE (FLAGYL) IVPB 500 mg     500 mg 100 mL/hr over 60 Minutes Intravenous  Once 02/03/16 2050 02/03/16 2239      Medications   Scheduled Meds: . apixaban  5 mg Oral BID  . aztreonam  1 g Intravenous Q8H  . digoxin  0.125 mg Oral Daily  . docusate sodium  200 mg Oral BID  . feeding supplement (ENSURE ENLIVE)  237 mL Oral BID BM  . insulin aspart  0-5 Units Subcutaneous QHS  . insulin aspart  0-9 Units Subcutaneous TID WC  . metoprolol succinate  100 mg Oral BID  . metronidazole  500 mg Intravenous Q8H  . nystatin   Topical TID  . sodium chloride flush  3 mL Intravenous Q12H  . vancomycin  1,000 mg Intravenous Q24H   Continuous Infusions: . sodium chloride 75 mL/hr at 02/11/16 1801   PRN Meds:.acetaminophen **OR** acetaminophen, bisacodyl, diltiazem, magnesium hydroxide, ondansetron **OR** ondansetron (ZOFRAN) IV, oxyCODONE   Data Review:   Micro Results Recent Results (from the past 240 hour(s))  Urine culture     Status: None  Collection Time: 02/03/16  9:14 PM  Result Value Ref Range Status   Specimen Description URINE, RANDOM  Final   Special Requests Normal  Final   Culture NO GROWTH 2 DAYS  Final   Report Status 02/05/2016 FINAL  Final   Culture, blood (routine x 2)     Status: None   Collection Time: 02/03/16  9:15 PM  Result Value Ref Range Status   Specimen Description BLOOD LEFT HAND  Final   Special Requests BOTTLES DRAWN AEROBIC AND ANAEROBIC  Final   Culture NO GROWTH 5 DAYS  Final   Report Status 02/08/2016 FINAL  Final  Wound culture     Status: None   Collection Time: 02/03/16  9:26 PM  Result Value Ref Range Status   Specimen Description WOUND  Final   Special Requests Normal  Final   Gram Stain   Final    NO WBC SEEN RARE GRAM POSITIVE COCCI FEW GRAM NEGATIVE RODS    Culture   Final    MODERATE GROWTH PROTEUS MIRABILIS MODERATE GROWTH STAPHYLOCOCCUS AUREUS MODERATE GROWTH ESCHERICHIA COLI    Report Status 02/07/2016 FINAL  Final   Organism ID, Bacteria ESCHERICHIA COLI  Final   Organism ID, Bacteria PROTEUS MIRABILIS  Final   Organism ID, Bacteria STAPHYLOCOCCUS AUREUS  Final      Susceptibility   Staphylococcus aureus - MIC*    CIPROFLOXACIN Value in next row Resistant      RESISTANT>=8    ERYTHROMYCIN Value in next row Resistant      RESISTANT>=8    GENTAMICIN Value in next row Sensitive      SENSITIVE<0.5    OXACILLIN Value in next row Sensitive      SENSITIVE0.5    TETRACYCLINE Value in next row Sensitive      SENSITIVE2    VANCOMYCIN Value in next row Sensitive      SENSITIVE1    TRIMETH/SULFA Value in next row Sensitive      SENSITIVE<=10    CLINDAMYCIN Value in next row Resistant      RESISTANT>=8    RIFAMPIN Value in next row Sensitive      SENSITIVE<=0.5    Inducible Clindamycin Value in next row Sensitive      SENSITIVE<=0.5    * MODERATE GROWTH STAPHYLOCOCCUS AUREUS   Escherichia coli - MIC*    AMPICILLIN Value in next row Resistant      SENSITIVE<=0.5    CEFAZOLIN Value in next row Sensitive      SENSITIVE<=0.5    CEFEPIME Value in next row Sensitive      SENSITIVE<=0.5    CEFTAZIDIME Value in next row Sensitive      SENSITIVE<=0.5    CEFTRIAXONE Value in next  row Sensitive      SENSITIVE<=0.5    CIPROFLOXACIN Value in next row Resistant      SENSITIVE<=0.5    GENTAMICIN Value in next row Sensitive      SENSITIVE<=0.5    IMIPENEM Value in next row Sensitive      SENSITIVE<=0.5    TRIMETH/SULFA Value in next row Resistant      SENSITIVE<=0.5    AMPICILLIN/SULBACTAM Value in next row Intermediate      SENSITIVE<=0.5    PIP/TAZO Value in next row Sensitive      SENSITIVE<=0.5    Extended ESBL Value in next row Sensitive      SENSITIVE<=0.5    * MODERATE GROWTH ESCHERICHIA COLI   Proteus mirabilis - MIC*  AMPICILLIN Value in next row Resistant      RESISTANT>=32    CEFAZOLIN Value in next row Sensitive      SENSITIVE<=4    CEFEPIME Value in next row Sensitive      SENSITIVE<=1    CEFTAZIDIME Value in next row Sensitive      SENSITIVE<=1    CEFTRIAXONE Value in next row Sensitive      SENSITIVE<=1    CIPROFLOXACIN Value in next row Sensitive      SENSITIVE1    GENTAMICIN Value in next row Resistant      RESISTANT>=16    IMIPENEM Value in next row Sensitive      SENSITIVE2    TRIMETH/SULFA Value in next row Resistant      RESISTANT>=320    AMPICILLIN/SULBACTAM Value in next row Sensitive      SENSITIVE8    PIP/TAZO Value in next row Sensitive      SENSITIVE<=4    * MODERATE GROWTH PROTEUS MIRABILIS  Anaerobic culture     Status: None   Collection Time: 02/03/16  9:26 PM  Result Value Ref Range Status   Specimen Description LEG  Final   Special Requests Normal  Final   Culture NO ANAEROBES ISOLATED  Final   Report Status 02/07/2016 FINAL  Final    Radiology Reports Dg Chest Port 1 View  02/03/2016  CLINICAL DATA:  Facial swelling and erythema. Reese's. Sudden onset this evening. EXAM: PORTABLE CHEST 1 VIEW COMPARISON:  12/26/2014 FINDINGS: A single AP portable view of the chest demonstrates no focal airspace consolidation or alveolar edema. The lungs are grossly clear. There is no large effusion or pneumothorax. There is  unchanged cardiomegaly. Cardiac and mediastinal contours are otherwise unremarkable. IMPRESSION: Unchanged cardiomegaly.  No acute cardiopulmonary findings. Electronically Signed   By: Ellery Plunk M.D.   On: 02/03/2016 22:21   Dg Foot Complete Right  01/22/2016  CLINICAL DATA:  Nonhealing wound over the second toe of the right foot; images were obtained with foot in bandages. The patient has known diabetes and peripheral vascular disease. EXAM: RIGHT FOOT COMPLETE - 3+ VIEW COMPARISON:  Right foot series of Feb 11, 2015. FINDINGS: The bones of the right foot are diffusely osteopenic which appears more severe overall on today's study than on the study of 11 months ago. No definite loss of cortical bone is observed in the second toe. The joint spaces are preserved. The other phalanges appear intact as well as do the metatarsals. The bones of the hindfoot are unremarkable. There is soft tissue swelling of the second toe. IMPRESSION: Findings compatible with cellulitis of the second toe. No objective evidence of osteomyelitis but there is severe diffuse osteopenia which limits sensitivity of the study. Electronically Signed   By: David  [place] M.D.   On: 01/22/2016 16:22     CBC  Recent Labs Lab 02/06/16 0406 02/07/16 0417 02/09/16 0443 02/10/16 0439  WBC 11.9* 12.3* 15.0* 13.7*  HGB 9.6* 9.5* 9.6* 9.7*  HCT 28.2* 28.2* 28.8* 28.8*  PLT 238 274 379 444*  MCV 90.0 91.5 91.7 91.1  MCH 30.5 30.7 30.7 30.8  MCHC 33.8 33.5 33.5 33.8  RDW 16.7* 16.6* 16.9* 16.9*    Chemistries   Recent Labs Lab 02/05/16 1923 02/06/16 0406 02/07/16 0417 02/09/16 0443 02/11/16 0407  NA  --  133* 134* 133*  --   K  --  4.0 3.8 4.1  --   CL  --  99* 101 98*  --  CO2  --  27 27 28   --   GLUCOSE  --  89 116* 99  --   BUN  --  32* 24* 23*  --   CREATININE  --  0.77 0.62 0.58 0.56  CALCIUM  --  8.5* 8.4* 8.2*  --   MG 1.9  --   --   --   --     ------------------------------------------------------------------------------------------------------------------ estimated creatinine clearance is 45.3 mL/min (by C-G formula based on Cr of 0.56). ------------------------------------------------------------------------------------------------------------------ No results for input(s): HGBA1C in the last 72 hours. ------------------------------------------------------------------------------------------------------------------ No results for input(s): CHOL, HDL, LDLCALC, TRIG, CHOLHDL, LDLDIRECT in the last 72 hours. ------------------------------------------------------------------------------------------------------------------ No results for input(s): TSH, T4TOTAL, T3FREE, THYROIDAB in the last 72 hours.  Invalid input(s): FREET3 ------------------------------------------------------------------------------------------------------------------ No results for input(s): VITAMINB12, FOLATE, FERRITIN, TIBC, IRON, RETICCTPCT in the last 72 hours.  Coagulation profile No results for input(s): INR, PROTIME in the last 168 hours.  No results for input(s): DDIMER in the last 72 hours.  Cardiac Enzymes No results for input(s): CKMB, TROPONINI, MYOGLOBIN in the last 168 hours.  Invalid input(s): CK ------------------------------------------------------------------------------------------------------------------ Invalid input(s): POCBNP    Assessment & Plan  Patient is a 82 year old white female being admitted with cellulitis  1.  Right lower extremity Cellulitis  : Admitted on May 9; wound cultures are showing Proteus, Escherichia coli, staph aureus. Patient with multiple allergies to antibiotics.,Seen by ID, recommended Cipro 500 mg by mouth twice a day for 7 days, 100 doxycycline 100 mg by mouth twice a day for 7 more days Patient will go to  [facility]  Tomorrow if her diarrhea resolves and cdiff is negative.    Patient has history of peripheral vascular disease, seen by vascular, had right leg angiogram done, angioplasty is done for proximal right anterior tibial artery, mid right posterior tibial artery l. WBC  Is down from 25-13.7. No fever. Wound care consult for chronic lower extremity wound and  Wounds  in the sacral area. WBC trending down ,continue current antibiotics apply barrier cream twice daily and when necessary for incontinence, no disposable brief summary and pads, continue dressing changes to the right leg, out of bed to chair today. Patient is wheelchair bound at baseline. Likely discharge to [facility] tomorrow  Patient seen by vascular surgery she will have arteriogram this week,. Wound culture showed Proteus, Escherichia coli, staph aureus. Wbc is coming down, Continue pain medicine.  2.  Diabetic foot ulcer (HCC) - wound care consulted.   3. Paroxysmal atrial fibrillation ; continue digoxin, Toprol-XL, and Eliquis.HR low in 47  Yesterday only one time but  Has been in 60-80-120.so will continue present treatment and monitor on tele , 4. Diabetes type 2; c;ontinue sliding scale insulin  overall stable .   5. PVD (peripheral vascular disease) (HCC) - continue home meds including anticoagulationvascular consult as abo  6.  HTN (hypertension) - continue therapy with Toprol   7. Chronic congestive heart failure type unknown  compensated continue oral Lasix,d/c iv fluids  8. Elevated troponin due to demand ischemia  9. DVT prophylaxis patient RD on therapy with Eliquis  #10: Constipation:improved , now has diarrhea; discontinue stool softeners, check stool for C. difficile.    Code Status Orders        Start     Ordered   02/04/16 0029  Do not attempt resuscitation (DNR)   Continuous    Question Answer Comment  In the event of cardiac or respiratory ARREST Do  not call a "code blue"   In the event of cardiac or respiratory ARREST Do not perform  Intubation, CPR, defibrillation or ACLS   In the event of cardiac or respiratory ARREST Use medication by any route, position, wound care, and other measures to relive pain and suffering. May use oxygen, suction and manual treatment of airway obstruction as needed for comfort.      02/04/16 0028    Code Status History    Date Active Date Inactive Code Status Order ID Comments User Context   02/04/2016 12:17 AM 02/04/2016 12:28 AM Full Code [id number]  Oralia Manis, MD Inpatient   07/15/2015  4:09 PM 07/15/2015  9:52 PM Full Code [id number]  Renford Dills, MD Inpatient   04/08/2015  9:38 AM 04/08/2015  3:24 PM Full Code [id number]  Renford Dills, MD Inpatient    Advance Directive Documentation        Most Recent Value   Type of Advance Directive  Out of facility DNR (pink MOST or yellow form)   Pre-existing out of facility DNR order (yellow form or pink MOST form)  Physician notified to receive inpatient order   "MOST" Form in Place?             Consults  Wound care nurse DVT Prophylaxis  Eliquis  Lab Results  Component Value Date   PLT 444* 02/10/2016     Time Spent in minutes   35 min  Greater than 50% of time spent in care coordination and counseling patient regarding the condition and plan of care.   Katha Hamming M.D on 02/12/2016 at 10:48 AM  Between 7am to 6pm - Pager - [phone]  After 6pm go to [email or web address] - password [email or web address] [facility]  [facility]   Office  [phone]

## 2016-02-12 NOTE — Clinical Social Work Note (Signed)
CSW spoke with pt's daughter in law. Pt will be on PO ABX, however pt will need respite. Pt's daughter in law is aware that pt will be private pay. [facility] is able to accept pt in the ALF, so it will be a little less expensive. CSW will update the PASARR.   Pt is now on precautions for rule out C.Diff and facility is aware of this development. CSW sent WOC note to facility, as pt may be skillable.   CSW will continue to follow and will work with [facility] for placement.   Dede QuerySarah Lorcan Shelp, MSW, LCSW  Clinical Social Worker  [phone]

## 2016-02-12 NOTE — Progress Notes (Addendum)
Pt with significant moisture associated contact dermatitis.  With last BM noted pt has a fissure in skin at gluteal fold.  Recommend the use of Triad Cream or similar wound gel.  Spoke with Dr Sheryle Hailiamond and order for Wound Care consult. Henriette CombsSarah Libbey Duce RN CWOCN

## 2016-02-12 NOTE — Care Management (Signed)
Patient was to have discharged today but had bradycardia and tachycardia preventing discharge.  Concern for tachy brady syndrome?  Will assess one more day.  It was anticipated that patient would discharge home with resumption of home health but her daughter in law (caregiver) had a death in the family.  CSW has arranged for patient to go to [facility] under a respite plan of care in the assisted living.  Notified [facility]. Will need to determine if [facility] will/can see at the facility.  patient is a current patient of [facility]

## 2016-02-13 LAB — GLUCOSE, CAPILLARY
GLUCOSE-CAPILLARY: 155 mg/dL — AB (ref 65–99)
Glucose-Capillary: 96 mg/dL (ref 65–99)

## 2016-02-13 MED ORDER — CIPROFLOXACIN HCL 500 MG PO TABS: 500.0000 mg | ORAL_TABLET | Freq: Two times a day (BID) | ORAL | Status: DC

## 2016-02-13 MED ORDER — NYSTATIN 100000 UNIT/GM EX POWD: Freq: Three times a day (TID) | CUTANEOUS | Status: AC

## 2016-02-13 MED ORDER — ENSURE ENLIVE PO LIQD: 237.0000 mL | Freq: Two times a day (BID) | ORAL | Status: AC

## 2016-02-13 MED ORDER — MINOCYCLINE HCL 100 MG PO CAPS: 100.0000 mg | ORAL_CAPSULE | Freq: Two times a day (BID) | ORAL | Status: DC

## 2016-02-13 MED ORDER — NYSTATIN 100000 UNIT/ML MT SUSP: 5.0000 mL | Freq: Four times a day (QID) | OROMUCOSAL | Status: AC

## 2016-02-13 MED ORDER — METOPROLOL TARTRATE 50 MG PO TABS: 50.0000 mg | ORAL_TABLET | Freq: Two times a day (BID) | ORAL | Status: AC

## 2016-02-13 MED ORDER — OXYCODONE HCL 5 MG PO TABS: 5.0000 mg | ORAL_TABLET | Freq: Four times a day (QID) | ORAL | Status: DC | PRN

## 2016-02-13 MED ORDER — NYSTATIN 100000 UNIT/ML MT SUSP
5.0000 mL | Freq: Four times a day (QID) | OROMUCOSAL | Status: DC
  Administered 2016-02-13 (×2): 500000 [IU] via OROMUCOSAL
  Filled 2016-02-13 (×2): qty 5

## 2016-02-13 NOTE — Care Management Note (Addendum)
Case Management Note  Patient Details  Name: [name] MRN: [id number] Date of Birth: 03/17/1921  Subjective/Objective: LM with BJ @  [facility]  [phone]) to follow up with CM note dated 02/12/2016. Pt will be discharged to [facility] today. Plan is [facility] in the absence of the primary caregiver who is attending funeral at this time. (respite care). Will await return call.                    Action/Plan:CM will continue to follow.    Expected Discharge Date:                  Expected Discharge Plan:  Assisted Living / Rest Home (discharge to [facility] with [facility])  In-House Referral:  Clinical Social Work  Discharge planning Services  CM Consult  Post Acute Care Choice:   (Has W/C) Choice offered to:     DME Arranged:    DME Agency:     HH Arranged:    HH Agency:  [facility]  Status of Service:  In process, will continue to follow  Medicare Important Message Given:  Yes Date Medicare IM Given:    Medicare IM give by:    Date Additional Medicare IM Given:    Additional Medicare Important Message give by:     If discussed at Long Length of Stay Meetings, dates discussed:    Additional Comments: Confirmed with [name] That [facility] will follow this pt if she is in the AL side of [facility] No further CM needs at this time.    [name], [name], RN 02/13/2016, 12:22 PM

## 2016-02-13 NOTE — Progress Notes (Signed)
Pt unable to go to [facility] per CSW. Has been accepted at [facility] today. Will no longer need the services of [facility], made BJ, the liaison, aware. No further CM needs at this time.

## 2016-02-13 NOTE — Progress Notes (Addendum)
Pt is alert and oriented, bed bound, incontinent of urine, no bm throughout shift, oxycodone given in am for pain. Pt is d/c to [facility] on po antibiotics, report given to [facility], pt will be transported via EMS, daughter in law Marisue IvanLiz at bedside and updated on plan. DNR in EMS packet.

## 2016-02-13 NOTE — Discharge Summary (Addendum)
[name], is a 82 y.o. female  DOB Feb 18, 1921  MRN [id number].  Admission date:  02/03/2016  Admitting Physician  Oralia Manis, MD  Discharge Date:  02/13/2016   Primary MD  DAVID Terance Hart, MD  Recommendations for primary care physician for things to follow:      Admission Diagnosis  Cellulitis of right lower extremity [L03.115] Sepsis, due to unspecified organism [facility]) [A41.9]   Discharge Diagnosis  Cellulitis of right lower extremity [L03.115] Sepsis, due to unspecified organism [facility]) [A41.9]    Principal Problem:   Cellulitis Active Problems:   Diabetic foot ulcer (HCC)   PVD (peripheral vascular disease) (HCC)   HTN (hypertension)   A-fib (HCC)   CHF (congestive heart failure) (HCC)      Past Medical History  Diagnosis Date  . Peripheral vascular disease (HCC)   . Dysrhythmia   . Diabetes mellitus without complication (HCC)   . Hypertension   . CHF (congestive heart failure) (HCC)   . Atrial fibrillation (HCC)   . Gout   . Foot ulcer, right (HCC)   . Gangrene of toe [facility])     Past Surgical History  Procedure Laterality Date  . Leg amputation through knee Left   . Peripheral vascular catheterization Right 04/08/2015    Procedure: Lower Extremity Angiography;  Surgeon: Renford Dills, MD;  Location: [facility] INVASIVE CV LAB;  Service: Cardiovascular;  Laterality: Right;  . Peripheral vascular catheterization  04/08/2015    Procedure: Lower Extremity Intervention;  Surgeon: Renford Dills, MD;  Location: [facility] INVASIVE CV LAB;  Service: Cardiovascular;;  . Joint replacement    . Right knee replacement    . Eye surgery    . Peripheral vascular catheterization N/A 07/15/2015    Procedure: Abdominal Aortogram w/Lower Extremity;  Surgeon: Renford Dills, MD;  Location: [facility] INVASIVE CV LAB;   Service: Cardiovascular;  Laterality: N/A;  . Peripheral vascular catheterization  07/15/2015    Procedure: Lower Extremity Intervention;  Surgeon: Renford Dills, MD;  Location: [facility] INVASIVE CV LAB;  Service: Cardiovascular;;  . Peripheral vascular catheterization Right 02/09/2016    Procedure: Lower Extremity Angiography;  Surgeon: Annice Needy, MD;  Location: [facility] INVASIVE CV LAB;  Service: Cardiovascular;  Laterality: Right;  . Peripheral vascular catheterization  02/09/2016    Procedure: Lower Extremity Intervention;  Surgeon: Annice Needy, MD;  Location: [facility] INVASIVE CV LAB;  Service: Cardiovascular;;       History of present illness and  Hospital Course:     Kindly see H&P for history of present illness and admission details, please review complete Labs, Consult reports and Test reports for all details in brief  HPI  from the history and physical done on the day of admission  82 year old female patient admitted on  May 9 because of leg cellulitis getting progressively worse not improved with outpatient Keflex. WBC on admission 25,000. Admitted for cellulitis of the right leg.   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 At this present on admission secondary to right leg cellulitis. Admitted to regular floor started on aggressive hydration, IV antibiotics. All allergic to multiple antibiotics so started on vancomycin, Flagyl. Azactam. Vascular has seen the patient. Taken to angiogram of the right leg. On May 15, had angioplasty of proximal right anterior tibial artery, mid right posterior tibial artery, peroneal artery. from the tibioperoneal trunk through the mid to distal peroneal artery. And pain in the right leg is improved,  cellulitis also improved. Seen by ID, and wbc  improved from 25-13. Wound cultures showed  MSSA,Proteus, Escherichia coli . [facility] recommended Cipro 500 mg by mouth twice a day for 7 days to cover Proteus,  doxycycline 100 mg by mouth twice a day for 7 days to cover for staph and Escherichia coli. Patient can follow up with wound care center to repeat wound cultures. Patient received 10 days of IV antibiotics #2. peripheral vascular disease, chronic ulcers of the right leg status post revascularization. #3. Paroxysmal atrial fibrillation: Continue digoxin, metoprolol  tartrate, and liquids. Patient heart rate has been controlled. 4.Chronic congestive heart failure compensated continue oral Lasix. 5.Diarrhea secondary to stool softeners: Stool for C. difficile has been negative and stool cultures also negative  dis continued to stool softeners does have a lot of diaper rash patient is seen by wound care nurse, commended barrier creams.  Needs  low air loss mattress  As commended by wound care #6 CODE STATUS DO NOT RESUSCITATE   Discharge Condition: stable   Follow UP  Follow-up Information    Follow up with [facility] A STEGMAYER, PA-C In 3 weeks.   Specialty:  Physician Assistant   Why:  For wound re-check   Contact information:   [address] [place] [address] [phone]         Discharge Instructions  and  Discharge Medications        Medication List    STOP taking these medications        cephALEXin 500 MG capsule  Commonly known as:  KEFLEX      TAKE these medications        apixaban 5 MG Tabs tablet  Commonly known as:  ELIQUIS  Take 5 mg by mouth 2 (two) times daily.     ciprofloxacin 500 MG tablet  Commonly known as:  CIPRO  Take 1 tablet (500 mg total) by mouth 2 (two) times daily.     clotrimazole-betamethasone cream  Commonly known as:  LOTRISONE  Apply 1 application topically daily.     Copper Gluconate 2 MG Tabs  Take 2 mg by mouth 1 day or 1 dose.     digoxin 0.125 MG tablet  Commonly known as:  LANOXIN  Take 0.0625 mg by mouth daily.     docusate sodium 100 MG capsule  Commonly known as:  COLACE  Take 100 mg by mouth 2 (two) times daily  as needed.     feeding supplement (ENSURE ENLIVE) Liqd  Take 237 mLs by mouth 2 (two) times daily between meals.     ferrous sulfate 325 (65 FE) MG tablet  Take 325 mg by mouth daily.     furosemide 20 MG tablet  Commonly known as:  LASIX  Take 40 mg by mouth 2 (two) times daily.     metoprolol 50 MG tablet  Commonly known as:  LOPRESSOR  Take 1 tablet (50 mg total) by mouth 2 (two) times daily.     minocycline 100 MG capsule  Commonly known as:  MINOCIN,DYNACIN  Take 1 capsule (100 mg total) by mouth 2 (two) times daily.     nystatin 100000 UNIT/ML suspension  Commonly known as:  MYCOSTATIN  Use as directed 5 mLs (500,000 Units total) in the mouth or throat 4 (four) times daily.     nystatin powder  Commonly known as:  nystatin  Apply topically 3 (three) times daily.     potassium chloride 10 MEQ tablet  Commonly  known as:  K-DUR  Take 10 mEq by mouth daily.     pyridOXINE 100 MG tablet  Commonly known as:  VITAMIN B-6  Take 100 mg by mouth daily.     quinapril 10 MG tablet  Commonly known as:  ACCUPRIL  Take 10 mg by mouth daily.     selenium 50 MCG Tabs tablet  Take 200 mcg by mouth daily.     vitamin B-12 1000 MCG tablet  Commonly known as:  CYANOCOBALAMIN  Take 1,000 mcg by mouth daily.     Vitamin D3 5000 units Caps  Take 1 capsule by mouth daily.     zinc gluconate 50 MG tablet  Take 50 mg by mouth daily.          Diet and Activity recommendation: See Discharge Instructions above   Consults obtained -     ID, vascular, wound care Major procedures and Radiology Reports - PLEASE review detailed and final reports for all details, in brief -      Dg Chest Port 1 View  02/03/2016  CLINICAL DATA:  Facial swelling and erythema. Reese's. Sudden onset this evening. EXAM: PORTABLE CHEST 1 VIEW COMPARISON:  12/26/2014 FINDINGS: A single AP portable view of the chest demonstrates no focal airspace consolidation or alveolar edema. The lungs are grossly  clear. There is no large effusion or pneumothorax. There is unchanged cardiomegaly. Cardiac and mediastinal contours are otherwise unremarkable. IMPRESSION: Unchanged cardiomegaly.  No acute cardiopulmonary findings. Electronically Signed   By: Ellery Plunk M.D.   On: 02/03/2016 22:21   Dg Foot Complete Right  01/22/2016  CLINICAL DATA:  Nonhealing wound over the second toe of the right foot; images were obtained with foot in bandages. The patient has known diabetes and peripheral vascular disease. EXAM: RIGHT FOOT COMPLETE - 3+ VIEW COMPARISON:  Right foot series of Feb 11, 2015. FINDINGS: The bones of the right foot are diffusely osteopenic which appears more severe overall on today's study than on the study of 11 months ago. No definite loss of cortical bone is observed in the second toe. The joint spaces are preserved. The other phalanges appear intact as well as do the metatarsals. The bones of the hindfoot are unremarkable. There is soft tissue swelling of the second toe. IMPRESSION: Findings compatible with cellulitis of the second toe. No objective evidence of osteomyelitis but there is severe diffuse osteopenia which limits sensitivity of the study. Electronically Signed   By: David  [place] M.D.   On: 01/22/2016 16:22    Micro Results     Recent Results (from the past 240 hour(s))  Urine culture     Status: None   Collection Time: 02/03/16  9:14 PM  Result Value Ref Range Status   Specimen Description URINE, RANDOM  Final   Special Requests Normal  Final   Culture NO GROWTH 2 DAYS  Final   Report Status 02/05/2016 FINAL  Final  Culture, blood (routine x 2)     Status: None   Collection Time: 02/03/16  9:15 PM  Result Value Ref Range Status   Specimen Description BLOOD LEFT HAND  Final   Special Requests BOTTLES DRAWN AEROBIC AND ANAEROBIC  Final   Culture NO GROWTH 5 DAYS  Final   Report Status 02/08/2016 FINAL  Final  Wound culture     Status: None   Collection Time:  02/03/16  9:26 PM  Result Value Ref Range Status   Specimen Description WOUND  Final  Special Requests Normal  Final   Gram Stain   Final    NO WBC SEEN RARE GRAM POSITIVE COCCI FEW GRAM NEGATIVE RODS    Culture   Final    MODERATE GROWTH PROTEUS MIRABILIS MODERATE GROWTH STAPHYLOCOCCUS AUREUS MODERATE GROWTH ESCHERICHIA COLI    Report Status 02/07/2016 FINAL  Final   Organism ID, Bacteria ESCHERICHIA COLI  Final   Organism ID, Bacteria PROTEUS MIRABILIS  Final   Organism ID, Bacteria STAPHYLOCOCCUS AUREUS  Final      Susceptibility   Staphylococcus aureus - MIC*    CIPROFLOXACIN Value in next row Resistant      RESISTANT>=8    ERYTHROMYCIN Value in next row Resistant      RESISTANT>=8    GENTAMICIN Value in next row Sensitive      SENSITIVE<0.5    OXACILLIN Value in next row Sensitive      SENSITIVE0.5    TETRACYCLINE Value in next row Sensitive      SENSITIVE2    VANCOMYCIN Value in next row Sensitive      SENSITIVE1    TRIMETH/SULFA Value in next row Sensitive      SENSITIVE<=10    CLINDAMYCIN Value in next row Resistant      RESISTANT>=8    RIFAMPIN Value in next row Sensitive      SENSITIVE<=0.5    Inducible Clindamycin Value in next row Sensitive      SENSITIVE<=0.5    * MODERATE GROWTH STAPHYLOCOCCUS AUREUS   Escherichia coli - MIC*    AMPICILLIN Value in next row Resistant      SENSITIVE<=0.5    CEFAZOLIN Value in next row Sensitive      SENSITIVE<=0.5    CEFEPIME Value in next row Sensitive      SENSITIVE<=0.5    CEFTAZIDIME Value in next row Sensitive      SENSITIVE<=0.5    CEFTRIAXONE Value in next row Sensitive      SENSITIVE<=0.5    CIPROFLOXACIN Value in next row Resistant      SENSITIVE<=0.5    GENTAMICIN Value in next row Sensitive      SENSITIVE<=0.5    IMIPENEM Value in next row Sensitive      SENSITIVE<=0.5    TRIMETH/SULFA Value in next row Resistant      SENSITIVE<=0.5    AMPICILLIN/SULBACTAM Value in next row Intermediate       SENSITIVE<=0.5    PIP/TAZO Value in next row Sensitive      SENSITIVE<=0.5    Extended ESBL Value in next row Sensitive      SENSITIVE<=0.5    * MODERATE GROWTH ESCHERICHIA COLI   Proteus mirabilis - MIC*    AMPICILLIN Value in next row Resistant      RESISTANT>=32    CEFAZOLIN Value in next row Sensitive      SENSITIVE<=4    CEFEPIME Value in next row Sensitive      SENSITIVE<=1    CEFTAZIDIME Value in next row Sensitive      SENSITIVE<=1    CEFTRIAXONE Value in next row Sensitive      SENSITIVE<=1    CIPROFLOXACIN Value in next row Sensitive      SENSITIVE1    GENTAMICIN Value in next row Resistant      RESISTANT>=16    IMIPENEM Value in next row Sensitive      SENSITIVE2    TRIMETH/SULFA Value in next row Resistant      RESISTANT>=320    AMPICILLIN/SULBACTAM Value in next row  Sensitive      SENSITIVE8    PIP/TAZO Value in next row Sensitive      SENSITIVE<=4    * MODERATE GROWTH PROTEUS MIRABILIS  Anaerobic culture     Status: None   Collection Time: 02/03/16  9:26 PM  Result Value Ref Range Status   Specimen Description LEG  Final   Special Requests Normal  Final   Culture NO ANAEROBES ISOLATED  Final   Report Status 02/07/2016 FINAL  Final  Gastrointestinal Panel by PCR , Stool     Status: None   Collection Time: 02/12/16 12:09 PM  Result Value Ref Range Status   Campylobacter species NOT DETECTED NOT DETECTED Final   Plesimonas shigelloides NOT DETECTED NOT DETECTED Final   Salmonella species NOT DETECTED NOT DETECTED Final   Yersinia enterocolitica NOT DETECTED NOT DETECTED Final   Vibrio species NOT DETECTED NOT DETECTED Final   Vibrio cholerae NOT DETECTED NOT DETECTED Final   Enteroaggregative E coli (EAEC) NOT DETECTED NOT DETECTED Final   Enteropathogenic E coli (EPEC) NOT DETECTED NOT DETECTED Final   Enterotoxigenic E coli (ETEC) NOT DETECTED NOT DETECTED Final   Shiga like toxin producing E coli (STEC) NOT DETECTED NOT DETECTED Final   E. coli O157 NOT  DETECTED NOT DETECTED Final   Shigella/Enteroinvasive E coli (EIEC) NOT DETECTED NOT DETECTED Final   Cryptosporidium NOT DETECTED NOT DETECTED Final   Cyclospora cayetanensis NOT DETECTED NOT DETECTED Final   Entamoeba histolytica NOT DETECTED NOT DETECTED Final   Giardia lamblia NOT DETECTED NOT DETECTED Final   Adenovirus F40/41 NOT DETECTED NOT DETECTED Final   Astrovirus NOT DETECTED NOT DETECTED Final   Norovirus GI/GII NOT DETECTED NOT DETECTED Final   Rotavirus A NOT DETECTED NOT DETECTED Final   Sapovirus (I, II, IV, and V) NOT DETECTED NOT DETECTED Final  C difficile quick scan w PCR reflex     Status: None   Collection Time: 02/12/16 12:09 PM  Result Value Ref Range Status   C Diff antigen NEGATIVE NEGATIVE Final   C Diff toxin NEGATIVE NEGATIVE Final   C Diff interpretation Negative for C. difficile  Final       Today   Subjective:   [name] today has Decreased leg pain, [facility] Objective:   Blood pressure 140/71, pulse 104, temperature 98.4 F (36.9 C), temperature source Oral, resp. rate 16, height 5' 2.99" (1.6 m), weight 83.462 kg (184 lb), SpO2 96 %.   Intake/Output Summary (Last 24 hours) at 02/13/16 1000 Last data filed at 02/13/16 0900  Gross per 24 hour  Intake    360 ml  Output      0 ml  Net    360 ml    Exam Awake Alert, Oriented x 3, No new F.N deficits, Normal affect [place].AT,PERRAL Supple Neck,No JVD, No cervical lymphadenopathy appriciated.  Symmetrical Chest wall movement, Good air movement bilaterally, CTAB RRR,No Gallops,Rubs or new Murmurs, No Parasternal Heave +ve B.Sounds, Abd Soft, Non tender, No organomegaly appriciated, No rebound -guarding or rigidity. No Cyanosis, Clubbing or edema, No new Rash or bruise  Data Review   CBC w Diff: Lab Results  Component Value Date   WBC 13.7* 02/10/2016   WBC 8.9 01/04/2015   HGB 9.7* 02/10/2016   HGB 7.3* 01/05/2015   HCT 28.8* 02/10/2016   HCT 23.9*  01/04/2015   PLT 444* 02/10/2016   PLT 254 01/04/2015   LYMPHOPCT 5% 02/03/2016   LYMPHOPCT 26.2 01/04/2015  MONOPCT 3% 02/03/2016   MONOPCT 10.9 01/04/2015   EOSPCT 0% 02/03/2016   EOSPCT 1.3 01/04/2015   BASOPCT 0% 02/03/2016   BASOPCT 0.9 01/04/2015    CMP: Lab Results  Component Value Date   NA 133* 02/09/2016   NA 138 01/05/2015   K 4.1 02/09/2016   K 4.0 01/05/2015   CL 98* 02/09/2016   CL 103 01/05/2015   CO2 28 02/09/2016   CO2 31 01/05/2015   BUN 23* 02/09/2016   BUN 15 01/05/2015   CREATININE 0.56 02/11/2016   CREATININE 0.63 01/05/2015   PROT 6.2* 02/03/2016   PROT 5.4* 01/05/2015   ALBUMIN 2.4* 02/03/2016   ALBUMIN 2.3* 01/05/2015   BILITOT 0.8 02/03/2016   BILITOT 0.3 01/05/2015   ALKPHOS 73 02/03/2016   ALKPHOS 50 01/05/2015   AST 36 02/03/2016   AST 22 01/05/2015   ALT 17 02/03/2016   ALT 10* 01/05/2015  .disposition;to [employer] Pt agreed for this,  Total Time in preparing paper work, data evaluation and todays exam - 35 minutes  Stevey Stapleton M.D on 02/13/2016 at 10:00 AM    Note: This dictation was prepared with Dragon dictation along with smaller phrase technology. Any transcriptional errors that result from this process are unintentional.

## 2016-02-13 NOTE — Clinical Social Work Placement (Signed)
   CLINICAL SOCIAL WORK PLACEMENT  NOTE  Date:  02/13/2016  Patient Details  Name: [name] NationsHelmi Kassabian MRN: [id number] Date of Birth: 12/09/1920  Clinical Social Work is seeking post-discharge placement for this patient at the Skilled  Nursing Facility level of care (*CSW will initial, date and re-position this form in  chart as items are completed):  Yes   Patient/family provided with [facility] Clinical Social Work Department's list of facilities offering this level of care within the geographic area requested by the patient (or if unable, by the patient's family).  Yes   Patient/family informed of their freedom to choose among providers that offer the needed level of care, that participate in Medicare, Medicaid or managed care program needed by the patient, have an available bed and are willing to accept the patient.  Yes   Patient/family informed of [facility]'s ownership interest in [facility] and [facility], as well as of the fact that they are under no obligation to receive care at these facilities.  PASRR submitted to EDS on       PASRR number received on       Existing PASRR number confirmed on 02/10/16     FL2 transmitted to all facilities in geographic area requested by pt/family on 02/10/16     FL2 transmitted to all facilities within larger geographic area on       Patient informed that his/her managed care company has contracts with or will negotiate with certain facilities, including the following:        Yes   Patient/family informed of bed offers received.  Patient chooses bed at [facility]     Physician recommends and patient chooses bed at      [facility] to be [facility] on 02/13/16.  Patient to be [facility]     Patient family notified on 02/13/16 of transfer.  Name of family member notified:  Marisue IvanLiz, daughter in law     PHYSICIAN       Additional Comment:     _______________________________________________ Dede QuerySarah Amran Malter, LCSW 02/13/2016, 3:00 PM

## 2016-02-13 NOTE — Progress Notes (Signed)
Notified Dr. Luberta MutterKonidena that patient does not have rx for oxycodone that she is taking sporadically, MD will have someone send up RX. Verbal conversation.

## 2016-02-13 NOTE — Plan of Care (Signed)
Problem: Skin Integrity: Goal: Risk for impaired skin integrity will decrease Outcome: Progressing Skin on buttocks, groin and gluteal fold continues to be red and irritated.  Cleansing, barrier cream and nystatin powder applied as needed.

## 2016-02-13 NOTE — Clinical Social Work Note (Signed)
Pt is ready for discharge today. [facility] retracted their bed offer, however CSW was able to secure a bed at [facility]. They are able to skill her due to wounds. Facility has ordered the low air loss mattress as recommended by WOC. CSW updated pt's daughter in law, who is in agreement with new placement. Daughter in law will follow up with facility regarding admission. [facility] is able to accept pt today. RN to call report and [facility] will provide transportation. CSW is signing off as no further needs identified.   Dede QuerySarah Mirinda Monte, MSW, LCSW  Clinical Social Worker  [phone]

## 2016-03-12 ENCOUNTER — Encounter: Payer: Medicare Other | Attending: Surgery | Admitting: Surgery

## 2016-03-12 DIAGNOSIS — L97812 Non-pressure chronic ulcer of other part of right lower leg with fat layer exposed: Secondary | ICD-10-CM | POA: Diagnosis not present

## 2016-03-12 DIAGNOSIS — L97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: Secondary | ICD-10-CM | POA: Diagnosis not present

## 2016-03-12 DIAGNOSIS — I70235 Atherosclerosis of native arteries of right leg with ulceration of other part of foot: Secondary | ICD-10-CM | POA: Diagnosis not present

## 2016-03-12 DIAGNOSIS — Z89512 Acquired absence of left leg below knee: Secondary | ICD-10-CM | POA: Insufficient documentation

## 2016-03-12 DIAGNOSIS — E11622 Type 2 diabetes mellitus with other skin ulcer: Secondary | ICD-10-CM | POA: Diagnosis present

## 2016-03-13 NOTE — Progress Notes (Signed)
[name], [name] ([id number]) Visit Report for 03/12/2016 Allergy List Details Patient Name: [name], [name] Date of Service: 03/12/2016 9:30 AM Medical Record Number: [id number] Patient Account Number: [id number] Date of Birth/Sex: December 29, 1920 (82 y.o. Female) Treating RN: Phillis Haggis Primary Care Physician: Dorothey Baseman Other Clinician: Referring Physician: Dorothey Baseman Treating Physician/Extender: Rudene Re in Treatment: 0 Allergies Active Allergies cephalexin tramadol amoxicillin prednisolone Allergy Notes Electronic Signature(s) Signed: 03/12/2016 5:42:36 PM By: Alejandro Mulling Entered By: Alejandro Mulling on 03/12/2016 10:20:59 [name] ([id number]) -------------------------------------------------------------------------------- Arrival Information Details Patient Name: [name] Date of Service: 03/12/2016 9:30 AM Medical Record Number: [id number] Patient Account Number: [id number] Date of Birth/Sex: March 04, 1921 (82 y.o. Female) Treating RN: Ashok Cordia, Debi Primary Care Physician: Terance Hart, DAVID Other Clinician: Referring Physician: Terance Hart, DAVID Treating Physician/Extender: Rudene Re in Treatment: 0 Visit Information Patient Arrived: Wheel Chair Arrival Time: 09:52 Accompanied By: daughter Transfer Assistance: EasyPivot Patient Lift Patient Identification Verified: Yes Secondary Verification Process Yes Completed: Patient Requires Transmission- No Based Precautions: Patient Has Alerts: Yes Patient Alerts: Patient on Blood Thinner DM II Eliquis History Since Last Visit All ordered tests and consults were completed: No Added or deleted any medications: No Any new allergies or adverse reactions: No Had a fall or experienced change in activities of daily living that may affect risk of falls: No Signs or symptoms of abuse/neglect since last visito No Hospitalized since last visit: No Electronic Signature(s) Signed:  03/12/2016 5:42:36 PM By: Alejandro Mulling Entered By: Alejandro Mulling on 03/12/2016 09:55:22 [name] ([id number]) -------------------------------------------------------------------------------- Clinic Level of Care Assessment Details Patient Name: [name] Date of Service: 03/12/2016 9:30 AM Medical Record Number: [id number] Patient Account Number: [id number] Date of Birth/Sex: 09-24-1921 (82 y.o. Female) Treating RN: Ashok Cordia, Debi Primary Care Physician: Terance Hart, DAVID Other Clinician: Referring Physician: Dorothey Baseman Treating Physician/Extender: Rudene Re in Treatment: 0 Clinic Level of Care Assessment Items TOOL 2 Quantity Score X - Use when only an EandM is performed on the INITIAL visit 1 0 ASSESSMENTS - Nursing Assessment / Reassessment X - General Physical Exam (combine w/ comprehensive assessment (listed just 1 20 below) when performed on new pt. evals) X - Comprehensive Assessment (HX, ROS, Risk Assessments, Wounds Hx, etc.) 1 25 ASSESSMENTS - Wound and Skin Assessment / Reassessment []  - Simple Wound Assessment / Reassessment - one wound 0 X - Complex Wound Assessment / Reassessment - multiple wounds 3 5 []  - Dermatologic / Skin Assessment (not related to wound area) 0 ASSESSMENTS - Ostomy and/or Continence Assessment and Care []  - Incontinence Assessment and Management 0 []  - Ostomy Care Assessment and Management (repouching, etc.) 0 PROCESS - Coordination of Care []  - Simple Patient / Family Education for ongoing care 0 X - Complex (extensive) Patient / Family Education for ongoing care 1 20 X - Staff obtains Chiropractor, Records, Test Results / Process Orders 1 10 X - Staff telephones HHA, Nursing Homes / Clarify orders / etc 1 10 []  - Routine Transfer to another Facility (non-emergent condition) 0 []  - Routine Hospital Admission (non-emergent condition) 0 X - New Admissions / Manufacturing engineer / Ordering NPWT, Apligraf, etc. 1  15 []  - Emergency Hospital Admission (emergent condition) 0 X - Simple Discharge Coordination 1 10 Alipio, Telesha ([id number]) []  - Complex (extensive) Discharge Coordination 0 PROCESS - Special Needs []  - Pediatric / Minor Patient Management 0 []  - Isolation Patient Management 0 []  - Hearing / Language / Visual special needs 0 []  - Assessment of Community assistance (transportation, D/C planning, etc.) 0 []  -  Additional assistance / Altered mentation 0 []  - Support Surface(s) Assessment (bed, cushion, seat, etc.) 0 INTERVENTIONS - Wound Cleansing / Measurement X - Wound Imaging (photographs - any number of wounds) 1 5 []  - Wound Tracing (instead of photographs) 0 []  - Simple Wound Measurement - one wound 0 X - Complex Wound Measurement - multiple wounds 3 5 []  - Simple Wound Cleansing - one wound 0 X - Complex Wound Cleansing - multiple wounds 3 5 INTERVENTIONS - Wound Dressings []  - Small Wound Dressing one or multiple wounds 0 X - Medium Wound Dressing one or multiple wounds 1 15 []  - Large Wound Dressing one or multiple wounds 0 []  - Application of Medications - injection 0 INTERVENTIONS - Miscellaneous []  - External ear exam 0 []  - Specimen Collection (cultures, biopsies, blood, body fluids, etc.) 0 []  - Specimen(s) / Culture(s) sent or taken to Lab for analysis 0 []  - Patient Transfer (multiple staff / Nurse, adult / Similar devices) 0 []  - Simple Staple / Suture removal (25 or less) 0 []  - Complex Staple / Suture removal (26 or more) 0 Demaria, Valborg ([id number]) []  - Hypo / Hyperglycemic Management (close monitor of Blood Glucose) 0 []  - Ankle / Brachial Index (ABI) - do not check if billed separately 0 Has the patient been seen at the hospital within the last three years: Yes Total Score: 175 Level Of Care: New/Established - Level 5 Electronic Signature(s) Signed: 03/12/2016 5:42:36 PM By: Alejandro Mulling Entered By: Alejandro Mulling on 03/12/2016 11:43:51 [name] ([id number]) -------------------------------------------------------------------------------- Encounter Discharge Information Details Patient Name: [name] Date of Service: 03/12/2016 9:30 AM Medical Record Number: [id number] Patient Account Number: [id number] Date of Birth/Sex: [birth year] (82 y.o. Female) Treating RN: Ashok Cordia, Debi Primary Care Physician: Terance Hart, DAVID Other Clinician: Referring Physician: Dorothey Baseman Treating Physician/Extender: Rudene Re in Treatment: 0 Encounter Discharge Information Items Discharge Pain Level: 0 Discharge Condition: Stable Ambulatory Status: Wheelchair Discharge Destination: Home Transportation: Private Auto Accompanied By: daughter Schedule Follow-up Appointment: Yes Medication Reconciliation completed and provided to Patient/Care Yes Kindall Swaby: Provided on Clinical Summary of Care: 03/12/2016 Form Type Recipient Paper Patient HS Electronic Signature(s) Signed: 03/12/2016 5:42:36 PM By: Alejandro Mulling Previous Signature: 03/12/2016 10:55:10 AM Version By: Gwenlyn Perking Entered By: Alejandro Mulling on 03/12/2016 11:37:59 [name] ([id number]) -------------------------------------------------------------------------------- Lower Extremity Assessment Details Patient Name: [name] Date of Service: 03/12/2016 9:30 AM Medical Record Number: [id number] Patient Account Number: [id number] Date of Birth/Sex: [birth year] (82 y.o. Female) Treating RN: Phillis Haggis Primary Care Physician: Terance Hart, DAVID Other Clinician: Referring Physician: Terance Hart, DAVID Treating Physician/Extender: Rudene Re in Treatment: 0 Edema Assessment Assessed: [Left: No] [Right: No] E[Left: dema] [Right: :] Calf Left: Right: Point of Measurement: 34 cm From Medial Instep cm 33.5 cm Ankle Left: Right: Point of Measurement: 11 cm From Medial Instep cm 23.5 cm Vascular Assessment Pulses: Posterior  Tibial Dorsalis Pedis Palpable: [Right:No] Doppler: [Right:Multiphasic] Extremity colors, hair growth, and conditions: Extremity Color: [Right:Hyperpigmented] Temperature of Extremity: [Right:Warm] Capillary Refill: [Right:> 3 seconds] Electronic Signature(s) Signed: 03/12/2016 5:42:36 PM By: Alejandro Mulling Entered By: Alejandro Mulling on 03/12/2016 10:23:21 [name] ([id number]) -------------------------------------------------------------------------------- Multi Wound Chart Details Patient Name: [name] Date of Service: 03/12/2016 9:30 AM Medical Record Number: [id number] Patient Account Number: [id number] Date of Birth/Sex: 10/16/1920 (82 y.o. 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] Secondary Etiology: [11:N/A] [12:N/A] [13:Pressure Ulcer] Comorbid History: [11:Arrhythmia, Congestive Arrhythmia, Congestive Arrhythmia, Congestive Heart Failure, Deep Vein Heart Failure, Deep Vein Heart Failure, Deep Vein Thrombosis, Peripheral Arterial Disease, Type II Arterial Disease, Type II Arterial  Disease, Type II Diabetes, Gout, Osteoarthritis] [12:Thrombosis, Peripheral Thrombosis, Peripheral Diabetes, Gout, Osteoarthritis] [13:Diabetes, Gout, Osteoarthritis] Date Acquired: [11:01/15/2016] [12:01/26/2015]  [13:01/26/2015] Weeks of Treatment: [11:8] [12:0] [13:0] Wound Status: [11:Open] [12:Open] [13:Open] Measurements L x W x D 0.7x0.7x0.1 [12:0.7x0.7x0.1] [13:0.4x0.3x0.1] (cm) Area (cm) : [11:0.385] [12:0.385] [13:0.094] Volume (cm) : [11:0.038] [12:0.038] [13:0.009] % Reduction in Area: [11:12.50%] [12:N/A] [13:N/A] % Reduction in Volume: 56.80% [12:N/A] [13:N/A] Classification: [11:Partial Thickness] [12:Category/Stage II] [13:Grade 1] HBO Classification: [11:Grade 1] [12:Grade 1] [13:N/A] Exudate Amount: [11:Medium] [12:Medium] [13:Medium] Exudate Type: [11:Serous] [12:Serosanguineous] [13:Serosanguineous] Exudate Color: [11:amber] [12:red, brown] [13:red, brown] Wound Margin: [11:Flat and Intact] [12:Flat and Intact] [13:Flat and Intact] Granulation Amount: [11:None Present (0%)] [12:None Present (0%)] [13:None Present (0%)] Necrotic Amount: [11:Large (67-100%)] [12:Large (67-100%)] [13:Large (67-100%)] Necrotic Tissue: Eschar, Adherent Slough Eschar, Adherent Liberty MediaSlough Eschar, Adherent Slough Exposed Structures: Fascia: No Fascia: No Fascia: No Fat: No Fat: No Fat: No Tendon: No Tendon: No Tendon: No Muscle: No Muscle: No Muscle: No Joint: No Joint: No Joint: No Bone: No Bone: No Bone: No Limited to Skin Limited to Skin Limited to Skin Breakdown Breakdown Breakdown Epithelialization: None None None Periwound Skin Texture: Edema: Yes Edema: Yes Edema: Yes Periwound Skin No Abnormalities Noted No Abnormalities Noted No Abnormalities Noted Moisture: Periwound Skin Color: Erythema: Yes Erythema: Yes Erythema: Yes Erythema Location: Circumferential Circumferential Circumferential Temperature: No Abnormality No Abnormality No Abnormality Tenderness on Yes Yes Yes Palpation: Wound Preparation: Ulcer Cleansing: Ulcer Cleansing: Ulcer Cleansing: Rinsed/Irrigated with Rinsed/Irrigated with Rinsed/Irrigated with Saline Saline Saline Topical Anesthetic Topical Anesthetic  Topical Anesthetic Applied: Other: lidocaine Applied: Other: lidocaine Applied: Other: lidocaine 4% 4% 4% Wound Number: 14 N/A N/A Photos: No Photos N/A N/A Wound Location: Right Lower Leg - N/A N/A Posterior Wounding Event: Gradually Appeared N/A N/A Primary Etiology: Diabetic Wound/Ulcer of N/A N/A the Lower Extremity Secondary Etiology: N/A N/A N/A Comorbid History: Arrhythmia, Congestive N/A N/A Heart Failure, Deep Vein Thrombosis, Peripheral Arterial Disease, Type II Diabetes, Gout, Osteoarthritis Date Acquired: 01/26/2015 N/A N/A Weeks of Treatment: 0 N/A N/A Wound Status: Open N/A N/A Measurements L x W x D 11.5x6x0.1 N/A N/A (cm) Area (cm) : 54.192 N/A N/A Volume (cm) : 5.419 N/A N/A % Reduction in Area: N/A N/A N/A % Reduction in Volume: N/A N/A N/A Classification: Grade 1 N/A N/A Egelhoff, Katora ([id number]) HBO Classification: N/A N/A N/A Exudate Amount: Medium N/A N/A Exudate Type: Serous N/A N/A Exudate Color: amber N/A N/A Wound Margin: Flat and Intact N/A N/A Granulation Amount: None Present (0%) N/A N/A Necrotic Amount: Large (67-100%) N/A N/A Necrotic Tissue: Eschar N/A N/A Exposed Structures: Fascia: No N/A N/A Fat: No Tendon: No Muscle: No Joint: No Bone: No Limited to Skin Breakdown Epithelialization: None N/A N/A Periwound Skin Texture: Edema: Yes N/A N/A Periwound Skin No Abnormalities Noted N/A N/A Moisture: Periwound Skin Color: No Abnormalities Noted N/A N/A Erythema Location: N/A N/A N/A Temperature: No Abnormality N/A N/A Tenderness on Yes N/A N/A Palpation: Wound Preparation: Ulcer Cleansing: N/A N/A Rinsed/Irrigated  with Saline Topical Anesthetic Applied: Other: lidocaine 4% Treatment Notes Electronic Signature(s) Signed: 03/12/2016 5:42:36 PM By: Alejandro Mulling Entered By: Alejandro Mulling on 03/12/2016 10:40:26 [name]  ([id number]) -------------------------------------------------------------------------------- Pain Assessment Details Patient Name: [name] Date of Service: 03/12/2016 9:30 AM Medical Record Number: [id number] Patient Account Number: [id number] Date of Birth/Sex: [birth year] (82 y.o. Female) Treating RN: Ashok Cordia, Debi Primary Care Physician: Dorothey Baseman Other Clinician: Referring Physician: Dorothey Baseman Treating Physician/Extender: Rudene Re in Treatment: 0 Active Problems Location of Pain Severity and Description of Pain Patient Has Paino Yes Site Locations Pain Location: Pain in Ulcers With Dressing Change: Yes Duration of the Pain. Constant / Intermittento Constant Rate the pain. Current Pain Level: 4 Character of Pain Describe the Pain: Throbbing Pain Management and Medication Current Pain Management: Electronic Signature(s) Signed: 03/12/2016 5:42:36 PM By: Alejandro Mulling Entered By: Alejandro Mulling on 03/12/2016 09:56:02 [name] ([id number]) -------------------------------------------------------------------------------- Patient/Caregiver Education Details Patient Name: [name] Date of Service: 03/12/2016 9:30 AM Medical Record Number: [id number] Patient Account Number: [id number] Date of Birth/Gender: [birth year] (82 y.o. Female) Treating RN: Phillis Haggis Primary Care Physician: Terance Hart, DAVID Other Clinician: Referring Physician: Dorothey Baseman Treating Physician/Extender: Rudene Re in Treatment: 0 Education Assessment Education Provided To: Patient Education Topics Provided Wound/Skin Impairment: Handouts: Other: change dressing as ordered Methods: Demonstration, Explain/Verbal Responses: State content correctly Electronic Signature(s) Signed: 03/12/2016 5:42:36 PM By: Alejandro Mulling Entered By: Alejandro Mulling on 03/12/2016 10:44:26 [name]  ([id number]) -------------------------------------------------------------------------------- Wound Assessment Details Patient Name: [name] Date of Service: 03/12/2016 9:30 AM Medical Record Number: [id number] Patient Account Number: [id number] Date of Birth/Sex: 1921-01-24 (82 y.o. Female) Treating RN: Ashok Cordia, Debi Primary Care Physician: Terance Hart, DAVID Other Clinician: Referring Physician: Terance Hart, DAVID Treating Physician/Extender: Rudene Re in Treatment: 0 Wound Status Wound Number: 11 Primary Arterial Insufficiency Ulcer Etiology: Wound Location: Right Toe Second Wound Open Wounding Event: Not Known Status: Date Acquired: 01/15/2016 Comorbid Arrhythmia, Congestive Heart Failure, Weeks Of Treatment: 8 History: Deep Vein Thrombosis, Peripheral Clustered Wound: No Arterial Disease, Type II Diabetes, Gout, Osteoarthritis Photos Photo Uploaded By: Alejandro Mulling on 03/12/2016 17:24:11 Wound Measurements Length: (cm) 0.7 Width: (cm) 0.7 Depth: (cm) 0.1 Area: (cm) 0.385 Volume: (cm) 0.038 % Reduction in Area: 12.5% % Reduction in Volume: 56.8% Epithelialization: None Tunneling: No Undermining: No Wound Description Classification: Partial Thickness Foul Odor Aft Diabetic Severity (Wagner): Grade 1 Wound Margin: Flat and Intact Exudate Amount: Medium Exudate Type: Serous Exudate Color: amber er Cleansing: No Wound Bed Granulation Amount: None Present (0%) Exposed Structure Necrotic Amount: Large (67-100%) Fascia Exposed: No Calzada, Allaya ([id number]) Necrotic Quality: Eschar, Adherent Slough Fat Layer Exposed: No Tendon Exposed: No Muscle Exposed: No Joint Exposed: No Bone Exposed: No Limited to Skin Breakdown Periwound Skin Texture Texture Color No Abnormalities Noted: No No Abnormalities Noted: No Localized Edema: Yes Erythema: Yes Erythema Location: Circumferential Moisture No Abnormalities Noted: No Temperature /  Pain Temperature: No Abnormality Tenderness on Palpation: Yes Wound Preparation Ulcer Cleansing: Rinsed/Irrigated with Saline Topical Anesthetic Applied: Other: lidocaine 4%, Electronic Signature(s) Signed: 03/12/2016 5:42:36 PM By: Alejandro Mulling Entered By: Alejandro Mulling on 03/12/2016 10:13:09 [name] ([id number]) -------------------------------------------------------------------------------- Wound Assessment Details Patient Name: [name] Date of Service: 03/12/2016 9:30 AM Medical Record Number: [id number] Patient Account Number: [id number] Date of Birth/Sex: Jul 03, 1921 (82 y.o. Female) Treating RN: Phillis Haggis Primary Care Physician: Dorothey Baseman Other Clinician: Referring Physician: Dorothey Baseman Treating Physician/Extender: Rudene Re in Treatment: 0 Wound Status Wound Number: 12 Primary Pressure Ulcer Etiology: Wound Location:  Metatarsal head second Wound Open Wounding Event: Pressure Injury Status: Date Acquired: 01/26/2015 Comorbid Arrhythmia, Congestive Heart Failure, Weeks Of Treatment: 0 History: Deep Vein Thrombosis, Peripheral Clustered Wound: No Arterial Disease, Type II Diabetes, Gout, Osteoarthritis Photos Wound Measurements Length: (cm) 0.7 Width: (cm) 0.7 Depth: (cm) 0.1 Area: (cm) 0.385 Volume: (cm) 0.038 % Reduction in Area: 0% % Reduction in Volume: 0% Epithelialization: None Tunneling: No Undermining: No Wound Description Classification: Category/Stage II Foul Odor Aft Diabetic Severity (Wagner): Grade 1 Wound Margin: Flat and Intact Exudate Amount: Medium Exudate Type: Serosanguineous Exudate Color: red, brown er Cleansing: No Wound Bed Granulation Amount: None Present (0%) Exposed Structure Necrotic Amount: Large (67-100%) Fascia Exposed: No Necrotic Quality: Eschar, Adherent Slough Fat Layer Exposed: No Sieler, Lillian ([id number]) Tendon Exposed: No Muscle Exposed: No Joint Exposed:  No Bone Exposed: No Limited to Skin Breakdown Periwound Skin Texture Texture Color No Abnormalities Noted: No No Abnormalities Noted: No Localized Edema: Yes Erythema: Yes Erythema Location: Circumferential Moisture No Abnormalities Noted: No Temperature / Pain Temperature: No Abnormality Tenderness on Palpation: Yes Wound Preparation Ulcer Cleansing: Rinsed/Irrigated with Saline Topical Anesthetic Applied: Other: lidocaine 4%, Electronic Signature(s) Signed: 03/12/2016 5:42:36 PM By: Alejandro Mulling Entered By: Alejandro Mulling on 03/12/2016 17:26:12 [name] ([id number]) -------------------------------------------------------------------------------- Wound Assessment Details Patient Name: [name] Date of Service: 03/12/2016 9:30 AM Medical Record Number: [id number] Patient Account Number: [id number] Date of Birth/Sex: 1921/08/30 (82 y.o. Female) Treating RN: Ashok Cordia, Debi Primary Care Physician: Terance Hart, DAVID Other Clinician: Referring Physician: Terance Hart, DAVID Treating Physician/Extender: Rudene Re in Treatment: 0 Wound Status Wound Number: 13 Primary Diabetic Wound/Ulcer of the Lower Etiology: Extremity Wound Location: Right Metatarsal head first - Medial Secondary Pressure Ulcer Etiology: Wounding Event: Pressure Injury Wound Open Date Acquired: 01/26/2015 Status: Weeks Of Treatment: 0 Comorbid Arrhythmia, Congestive Heart Failure, Clustered Wound: No History: Deep Vein Thrombosis, Peripheral Arterial Disease, Type II Diabetes, Gout, Osteoarthritis Photos Photo Uploaded By: Alejandro Mulling on 03/12/2016 17:24:11 Wound Measurements Length: (cm) 0.4 Width: (cm) 0.3 Depth: (cm) 0.1 Area: (cm) 0.094 Volume: (cm) 0.009 % Reduction in Area: % Reduction in Volume: Epithelialization: None Tunneling: No Undermining: No Wound Description Classification: Grade 1 Wound Margin: Flat and Intact Exudate Amount: Medium Exudate  Type: Serosanguineous Exudate Color: red, brown Foul Odor After Cleansing: No Wound Bed Granulation Amount: None Present (0%) Exposed Structure Shadwick, Jahlia ([id number]) Necrotic Amount: Large (67-100%) Fascia Exposed: No Necrotic Quality: Eschar, Adherent Slough Fat Layer Exposed: No Tendon Exposed: No Muscle Exposed: No Joint Exposed: No Bone Exposed: No Limited to Skin Breakdown Periwound Skin Texture Texture Color No Abnormalities Noted: No No Abnormalities Noted: No Localized Edema: Yes Erythema: Yes Erythema Location: Circumferential Moisture No Abnormalities Noted: No Temperature / Pain Temperature: No Abnormality Tenderness on Palpation: Yes Wound Preparation Ulcer Cleansing: Rinsed/Irrigated with Saline Topical Anesthetic Applied: Other: lidocaine 4%, Treatment Notes Wound #13 (Right, Medial Metatarsal head first) 1. Cleansed with: Clean wound with Normal Saline 2. Anesthetic Topical Lidocaine 4% cream to wound bed prior to debridement 4. Dressing Applied: Aquacel Ag 5. Secondary Dressing Applied Gauze and Kerlix/Conform 7. Secured with Secretary/administrator) Signed: 03/12/2016 5:42:36 PM By: Alejandro Mulling Entered By: Alejandro Mulling on 03/12/2016 10:17:04 [name] ([id number]) -------------------------------------------------------------------------------- Wound Assessment Details Patient Name: [name] Date of Service: 03/12/2016 9:30 AM Medical Record Number: [id number] Patient Account Number: [id number] Date of Birth/Sex: 11/28/1920 (82 y.o. Female) Treating RN: Phillis Haggis Primary Care Physician: Dorothey Baseman Other Clinician: Referring Physician: Dorothey Baseman Treating Physician/Extender: Rudene Re in Treatment:  0 Wound Status Wound Number: 14 Primary Diabetic Wound/Ulcer of the Lower Etiology: Extremity Wound Location: Right, Posterior Lower Leg Wound Open Wounding Event: Gradually  Appeared Status: Date Acquired: 01/26/2015 Comorbid Arrhythmia, Congestive Heart Failure, Weeks Of Treatment: 0 History: Deep Vein Thrombosis, Peripheral Clustered Wound: No Arterial Disease, Type II Diabetes, Gout, Osteoarthritis Wound Measurements Length: (cm) 11.5 Width: (cm) 10 Depth: (cm) 0.1 Area: (cm) 90.321 Volume: (cm) 9.032 % Reduction in Area: -66.7% % Reduction in Volume: -66.7% Epithelialization: None Tunneling: No Undermining: No Wound Description Classification: Grade 1 Foul Odor Aft Wound Margin: Flat and Intact Exudate Amount: Medium Exudate Type: Serous Exudate Color: amber er Cleansing: No Wound Bed Granulation Amount: None Present (0%) Exposed Structure Necrotic Amount: Large (67-100%) Fascia Exposed: No Necrotic Quality: Eschar Fat Layer Exposed: No Tendon Exposed: No Muscle Exposed: No Joint Exposed: No Bone Exposed: No Limited to Skin Breakdown Periwound Skin Texture Texture Color No Abnormalities Noted: No No Abnormalities Noted: No Localized Edema: Yes Temperature / Pain Moisture Temperature: No Abnormality Malson, Tanairy ([id number]) No Abnormalities Noted: No Tenderness on Palpation: Yes Wound Preparation Ulcer Cleansing: Rinsed/Irrigated with Saline Topical Anesthetic Applied: Other: lidocaine 4%, Treatment Notes Wound #14 (Right, Posterior Lower Leg) 1. Cleansed with: Clean wound with Normal Saline 2. Anesthetic Topical Lidocaine 4% cream to wound bed prior to debridement 4. Dressing Applied: Aquacel Ag 5. Secondary Dressing Applied Gauze and Kerlix/Conform 7. Secured with Tape Notes netting Electronic Signature(s) Signed: 03/12/2016 5:42:36 PM By: Alejandro Mulling Entered By: Alejandro Mulling on 03/12/2016 11:42:55 [name] ([id number]) -------------------------------------------------------------------------------- Vitals Details Patient Name: [name] Date of Service: 03/12/2016 9:30 AM Medical  Record Number: [id number] Patient Account Number: [id number] Date of Birth/Sex: 03/08/1921 (82 y.o. Female) Treating RN: Phillis Haggis Primary Care Physician: Terance Hart, DAVID Other Clinician: Referring Physician: Terance Hart, DAVID Treating Physician/Extender: Rudene Re in Treatment: 0 Vital Signs Time Taken: 09:56 Pulse (bpm): 65 Respiratory Rate (breaths/min): 16 Blood Pressure (mmHg): 127/42 Reference Range: 80 - 120 mg / dl Electronic Signature(s) Signed: 03/12/2016 5:42:36 PM By: Alejandro Mulling Entered By: Alejandro Mulling on 03/12/2016 09:56:21

## 2016-03-13 NOTE — Progress Notes (Addendum)
JACQUETTA, POLHAMUS ([id number]) Visit Report for 03/12/2016 Chief Complaint Document Details Patient Name: [name], [name] Date of Service: 03/12/2016 9:30 AM Medical Record Number: [id number] Patient Account Number: [id number] Date of Birth/Sex: [birth year] (82 y.o. Female) Treating RN: Phillis Haggis Primary Care Physician: Terance Hart, DAVID Other Clinician: Referring Physician: Dorothey Baseman Treating Physician/Extender: Rudene Re in Treatment: 0 Information Obtained from: Patient Chief Complaint 82 year old patient who is very well-known to as returns after a recent admission to hospital and his prolonged stay in the nursing home. Electronic Signature(s) Signed: 03/12/2016 10:50:14 AM By: Evlyn Kanner MD, FACS Entered By: Evlyn Kanner on 03/12/2016 10:50:14 [name] ([id number]) -------------------------------------------------------------------------------- HPI Details Patient Name: [name] Date of Service: 03/12/2016 9:30 AM Medical Record Number: [id number] Patient Account Number: [id number] Date of Birth/Sex: September 01, 1921 (82 y.o. Female) Treating RN: Ashok Cordia, Debi Primary Care Physician: Dorothey Baseman Other Clinician: Referring Physician: Terance Hart, DAVID Treating Physician/Extender: Rudene Re in Treatment: 0 History of Present Illness Location: right medial heel and right second toe Quality: Patient reports experiencing a dull pain to affected area(s). Severity: Patient states wound are getting worse. Duration: Patient has had the wound for > 6months prior to seeking treatment at the wound center Timing: Pain in wound is Intermittent (comes and goes Context: The wound appeared gradually over time Modifying Factors: Consults to this date include:vascular surgeon and several recent surgeries. HPI Description: A pleasant 82 year old patient who is very well-known to my practice was seen about a month and a half ago until she was admitted to  hospital and is known to have diabetes mellitus for several years has recently gone through a series of operations with the vascular surgeon Dr. Gilda Crease. In January and February she had several surgeries on the left lower extremity with an attempt to have limb salvage for gangrenous changes of her forefoot. 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 On 01/07/2015 she also had a right lower extremity distal runoff with a angioplasty of the right anterior tibial artery to maximize her blood flow to the foot. She recently had her staples removed at Dr. Marijean Heath office on 01/31/2015 and at that time a right lower extremity duplex was done which showed patent vessels and a patent stent with distal occluded posterior tibial artery. Though the duplex was noncritical the recommendations from the PA at the vascular surgery office was that of a angiogram to be done but the patient said she would rather try some bone care before. The patient has received doxycycline and Cipro in the recent past and she takes oral medications for her diabetes. Other than that I reviewed her list of all her medications. No recent hemoglobin A1c has been done and no recent x-rays of the right foot have been done. 02/18/2015 -- x-ray of the right foot was done on 02/11/2015 and it shows no evidence of acute osteomyelitis of the third toe or of the calcaneus. She had gone to the vascular surgery office and they had noted that there is dehiscence of the left part of the amputation site of the below-knee wound and they have asked [place] to kindly take over the care of this. There've been doing dressings for the right heel and right third toe. 02/25/2015 -- we have received notes from the vascular group who saw her last on 02/13/2015 and she was seen by the PA Ms. Cleda Daub. She had recommended that the patient continue to follow with [place] for wound  care including management of the left BKA stump, which has had dehiscence of the lateral part. They will consider repeating a arterial duplex or angiogram if the right lower extremity does not heal within a reasonable period of time. 03/18/2015 -- he saw the vascular surgeon Dr. Gilda Crease and he has set her up for an angioplasty sometime in the middle of July. she is doing well otherwise. 04/10/2015 -- the patient had a procedure done on 04/08/2015 and this was a right angioplasty of the Tanabe, Takenya ([id number]) dorsalis pedis, anterior tibial artery, first superficial femoral artery in its midportion. There was successful intervention with recanalization and in-line flow to the right foot. 04/22/2015 -- the patient was looking rather pale today and the daughter confirms that her hemoglobin was down to 6.9. she is being monitored by her PCP. Her Lasix dose was increased and her edema has gone down significantly. 04/29/2015 -- she had vascular studies done and the ABI on the right is 1.3 and the toe pressures were within normal limits. Her hemoglobin is still around 7 and she refuses to take any blood transfusions for religious reasons. Her potassium was normal. 06/10/2015 -- she has a new blister on her right lateral and posterior part of her leg just in the region where the Kerlix bandages were applied and this may be due to an abrasion. 06/24/2015 -- On her right lower extremity she has developed several more blisters which look like small pustules and the drain informed shallow ulcerations. 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 07/01/2015 -- she has an appointment with the PCP this Friday and her vascular surgeon on Monday. Other than that she is on doxycycline and has finished 1 week of treatment. The pustules she had all over have now resolved. 07/08/2015 -- she saw the vascular surgeon who did studies in the office and found that there is poor circulation in the distal lower right leg and  has set her up for an angiogram and possible stenting next Tuesday. 07/22/2015 -- on October 18 she was taken up for a successful revascularization of the anterior tibial vessel on the right side. a PTA and stent placement was done to the right anterior tibial artery. 08/19/2015 -- she has broken out with some linear ulceration in the region of her webspaces of her toes and this is something new. 08/26/2015 -- over the last 3 days there was a streak of redness going from her lower extremity towards her toes but she had no fever or discharge from the wounds. 09/02/2015 -- the redness and cellulitis has gone down but she continues to have a lot of edema of the right lower extremity. 10/28/2015 -- over the last week she has been draining a lot of fluid from her right lower extremities and several of the wounds which had healed in the past and now opened up again 11/06/2015 -- she was seen in the vascular office and the right ABI was more than 1.3 which was consistent with noncompressible arteries due to medial calcification and the right great toe pressure was 0.24 and the PPG waveform showed significant decrease. from talking to the family member I believe no procedure has been planned. She was also seen by her PCP was increased her Lasix to 80 mg a day and has ordered some lab work but these reports are not back. 11/20/2015 -- Recent labs done hemoglobin A1c was 6.2, INR was .93, iron-binding capacity was 310, BMP was within normal limits except for the BUN which was  40 and albumin was 3.1. Her hemoglobin is up to 10.7 with a hematocrit of 34.2 he was advised lymphedema pumps by Dr. Gilda Crease and she has not been wearing these. Her daughter-in- law also says that she has been very noncompliant about elevation of her limbs. 12/18/2015 -- she was seen by Dr. Gilda Crease who agreed with the management and did not think she required any intervention. He discussed elevation and use of lymph pump and will  see her back as required. 01/15/2016 -- we have been seeing help me every other week and from what I understand from the family member she is quite stubborn and noncompliant and does not use any elevation of follow the advice saying Helder, Chong ([id number]) most medications and applications are causing her pain. 03/12/2016 -- was admitted to the hospital on 02/03/2016 with a right lower extremity cellulitis. She was put on vancomycin and Azactam and metronidazole. She was seen by Dr. Wyn Quaker who performed a aortogram and selective right lower extremity angiogram with a angioplasty of the proximal right anterior tibial artery and right posterior tibial artery and peroneal artery. She was also seen by Dr. Sampson Goon of infectious disease who recommended changing over to ciprofloxacin 500 mg by mouth twice a day for 7 more days and doxycycline 100 mg by mouth twice a day for 7 more days. She had 9 days of IV antibiotics including vancomycin, aztreonam and Flagyl and the wound was improving. He recommended that she follow-up with me at the wound center and if her leg was worsening repeat cultures could be done and he would make appropriate changes to the antibiotics. She was discharged home on 02/13/2016 with oral ciprofloxacin and doxycycline. She was seen in follow-up on 02/27/2016 at the vascular office for a postop visit status post right lower extremity angiogram and the postprocedure course was found to be uneventful. He was told to come back in a month for an ABI and right lower extremity duplex study to assess her PAD. of note recent x-rays done on April 27 and May 17 did not show any osteomyelitis of the second toe x-ray of the right foot -- IMPRESSION:Findings compatible with cellulitis of the second toe. No objective evidence of osteomyelitis but there is severe diffuse osteopenia which limits sensitivity of the study. Electronic Signature(s) Signed: 03/12/2016 10:49:38 AM By: Evlyn Kanner MD, FACS Entered By: Evlyn Kanner on 03/12/2016 10:49:37 [name] ([id number]) -------------------------------------------------------------------------------- Physical Exam Details Patient Name: [name] Date of Service: 03/12/2016 9:30 AM Medical Record Number: [id number] Patient Account Number: [id number] Date of Birth/Sex: 14-Feb-1921 (82 y.o. Female) Treating RN: Phillis Haggis Primary Care Physician: Dorothey Baseman Other Clinician: Referring Physician: Dorothey Baseman Treating Physician/Extender: Rudene Re in Treatment: 0 Constitutional . Pulse regular. Respirations normal and unlabored. Afebrile. . Eyes Nonicteric. Reactive to light. Ears, Nose, Mouth, and Throat Lips, teeth, and gums WNL.Marland Kitchen Moist mucosa without lesions. Neck supple and nontender. No palpable supraclavicular or cervical adenopathy. Normal sized without goiter. Respiratory WNL. No retractions.. Cardiovascular ABIs were not measured today as she has a upcoming arterial duplex pending and has had recent vascular surgery. the lymphedema is much better and it's a stage I. Chest Breasts symmetical and no nipple discharge.. Breast tissue WNL, no masses, lumps, or tenderness.. Gastrointestinal (GI) Abdomen without masses or tenderness.. No liver or spleen enlargement or tenderness.. Genitourinary (GU) No hydrocele, spermatocele, tenderness of the cord, or testicular mass.Marland Kitchen Penis without lesions.Renetta Chalk without lesions. No cystocele, or rectocele. Pelvic support intact, no discharge.Marland Kitchen Urethra  without masses, tenderness or scarring.Marland Kitchen Lymphatic No adneopathy. No adenopathy. No adenopathy. Musculoskeletal Adexa without tenderness or enlargement.. Digits and nails w/o clubbing, cyanosis, infection, petechiae, ischemia, or inflammatory conditions.. Integumentary (Hair, Skin) No suspicious lesions. No crepitus or fluctuance. No peri-wound warmth or erythema. No  masses.Marland Kitchen Psychiatric Judgement and insight Intact.. No evidence of depression, anxiety, or agitation.. Notes overall the right lower extremity is looking excellent with the edema and the cellulitis haven't completely gone down with prolonged inpatient care. There are a few scabs and eschars on the right lower extremity and the right heel shows a tenderness Mika, Yaritzel ([id number]) possibly due to minimal compression injury. The right second toe at the proximal PIP joint has an ulceration which has minimal necrotic debris. There is also superficial ulceration on the medial part of the first metatarsal head. She has got signs and symptoms of gout. Electronic Signature(s) Signed: 03/12/2016 10:52:13 AM By: Evlyn Kanner MD, FACS Entered By: Evlyn Kanner on 03/12/2016 10:52:13 [name] ([id number]) -------------------------------------------------------------------------------- Physician Orders Details Patient Name: [name] Date of Service: 03/12/2016 9:30 AM Medical Record Number: [id number] Patient Account Number: [id number] Date of Birth/Sex: 1920/11/09 (82 y.o. 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 Wound #13 Right,Medial  Metatarsal head first o Clean wound with Normal Saline. Wound #14 Right,Posterior Lower Leg o Clean wound with Normal Saline. Anesthetic Wound #12 Metatarsal head second o Topical Lidocaine 4% cream applied to wound bed prior to debridement - for clinic use Wound #13 Right,Medial Metatarsal head first o Topical Lidocaine 4% cream applied to wound bed prior to debridement - for clinic use Wound #14 Right,Posterior Lower Leg o Topical Lidocaine 4% cream applied to wound bed prior to debridement - for clinic use Primary Wound Dressing Wound #12 Metatarsal head second o Aquacel Ag Wound #13 Right,Medial Metatarsal head first o Aquacel Ag Wound #14 Right,Posterior Lower Leg Arington, Niti ([id number]) o Aquacel Ag Secondary Dressing Wound #12 Metatarsal head second o Gauze and Kerlix/Conform - netting Wound #13 Right,Medial Metatarsal head first o Gauze and Kerlix/Conform - netting Wound #14 Right,Posterior Lower Leg o Gauze and Kerlix/Conform - netting Dressing Change Frequency Wound #12 Metatarsal head second o Change dressing every other day. Wound #13 Right,Medial Metatarsal head first o Change dressing every other day. Wound #14 Right,Posterior Lower Leg o Change dressing every other day. Follow-up Appointments Wound #12 Metatarsal head second o Return Appointment in 1 week. Wound #13 Right,Medial Metatarsal head first o Return Appointment in 1 week. Wound #14 Right,Posterior Lower Leg o Return Appointment in 1 week. Edema Control Wound #12 Metatarsal head second o Elevate legs to the level of the heart and pump ankles as often as possible Wound #13 Right,Medial Metatarsal head first o Elevate legs to the level of the heart and pump ankles as often as possible Wound #14 Right,Posterior Lower Leg o Elevate legs to the level  of the heart and pump ankles as often as possible Off-Loading Wound #12 Metatarsal head second o Turn and  reposition every 2 hours o Other: - sage boot Hulon, Alaysiah ([id number]) Wound #13 Right,Medial Metatarsal head first o Turn and reposition every 2 hours o Other: - sage boot Wound #14 Right,Posterior Lower Leg o Turn and reposition every 2 hours o Other: - sage boot Additional Orders / Instructions Wound #12 Metatarsal head second o Increase protein intake. Wound #13 Right,Medial Metatarsal head first o Increase protein intake. Wound #14 Right,Posterior Lower Leg o Increase protein intake. Home Health Wound #12 Metatarsal head second o Continue Home Health Visits - [facility] o Home Health Nurse may visit PRN to address patientos wound care needs. o FACE TO FACE ENCOUNTER: MEDICARE and MEDICAID PATIENTS: I certify that this patient is under my care and that I had a face-to-face encounter that meets the physician face-to-face encounter requirements with this patient on this date. The encounter with the patient was in whole or in part for the following MEDICAL CONDITION: (primary reason for Home Healthcare) MEDICAL NECESSITY: I certify, that based on my findings, NURSING services are a medically necessary home health service. HOME BOUND STATUS: I certify that my clinical findings support that this patient is homebound (i.e., Due to illness or injury, pt requires aid of supportive devices such as crutches, cane, wheelchairs, walkers, the use of special transportation or the assistance of another person to leave their place of residence. There is a normal inability to leave the home and doing so requires considerable and taxing effort. Other absences are for medical reasons / religious services and are infrequent or of short duration when for other reasons). o If current dressing causes regression in wound condition, may D/C ordered dressing product/s and apply Normal Saline Moist Dressing daily until next Wound Healing Center / Other MD appointment. Notify Wound  Healing Center of regression in wound condition at [phone]. o Please direct any NON-WOUND related issues/requests for orders to patient's Primary Care Physician Wound #13 Right,Medial Metatarsal head first o Continue Home Health Visits - [facility] o Home Health Nurse may visit PRN to address patientos wound care needs. o FACE TO FACE ENCOUNTER: MEDICARE and MEDICAID PATIENTS: I certify that this patient is under my care and that I had a face-to-face encounter that meets the physician face-to-face encounter requirements with this patient on this date. The encounter with the patient was in whole or in part for the following MEDICAL CONDITION: (primary reason for Home Healthcare) MEDICAL NECESSITY: I certify, that based on my findings, NURSING services are a medically [name], Kathrina ([id number]) necessary home health service. HOME BOUND STATUS: I certify that my clinical findings support that this patient is homebound (i.e., Due to illness or injury, pt requires aid of supportive devices such as crutches, cane, wheelchairs, walkers, the use of special transportation or the assistance of another person to leave their place of residence. There is a normal inability to leave the home and doing so requires considerable and taxing effort. Other absences are for medical reasons / religious services and are infrequent or of short duration when for other reasons). o If current dressing causes regression in wound condition, may D/C ordered dressing product/s and apply Normal Saline Moist Dressing daily until next Wound Healing Center / Other MD appointment. Notify Wound Healing Center of regression in wound condition at [phone]. o Please direct any NON-WOUND related issues/requests for orders to patient's Primary Care Physician Wound #14 Right,Posterior Lower Leg o  Continue Home Health Visits - [facility] o Home Health Nurse may visit PRN to address patientos wound care  needs. o FACE TO FACE ENCOUNTER: MEDICARE and MEDICAID PATIENTS: I certify that this patient is under my care and that I had a face-to-face encounter that meets the physician face-to-face encounter requirements with this patient on this date. The encounter with the patient was in whole or in part for the following MEDICAL CONDITION: (primary reason for Home Healthcare) MEDICAL NECESSITY: I certify, that based on my findings, NURSING services are a medically necessary home health service. HOME BOUND STATUS: I certify that my clinical findings support that this patient is homebound (i.e., Due to illness or injury, pt requires aid of supportive devices such as crutches, cane, wheelchairs, walkers, the use of special transportation or the assistance of another person to leave their place of residence. There is a normal inability to leave the home and doing so requires considerable and taxing effort. Other absences are for medical reasons / religious services and are infrequent or of short duration when for other reasons). o If current dressing causes regression in wound condition, may D/C ordered dressing product/s and apply Normal Saline Moist Dressing daily until next Wound Healing Center / Other MD appointment. Notify Wound Healing Center of regression in wound condition at [phone]. o Please direct any NON-WOUND related issues/requests for orders to patient's Primary Care Physician Electronic Signature(s) Signed: 03/12/2016 4:58:26 PM By: Evlyn Kanner MD, FACS Signed: 03/12/2016 5:42:36 PM By: Alejandro Mulling Entered By: Alejandro Mulling on 03/12/2016 10:43:07 [name] ([id number]) -------------------------------------------------------------------------------- Problem List Details Patient Name: [name] Date of Service: 03/12/2016 9:30 AM Medical Record Number: [id number] Patient Account Number: [id number] Date of Birth/Sex: Aug 30, 1921 (82 y.o. Female) Treating RN:  Phillis Haggis Primary Care Physician: Dorothey Baseman Other Clinician: Referring Physician: Dorothey Baseman Treating Physician/Extender: Rudene Re in Treatment: 0 Active Problems ICD-10 Encounter Code Description Active Date Diagnosis E11.622 Type 2 diabetes mellitus with other skin ulcer 03/12/2016 Yes I70.235 Atherosclerosis of native arteries of right leg with 03/12/2016 Yes ulceration of other part of foot Z89.512 Acquired absence of left leg below knee 03/12/2016 Yes L97.412 Non-pressure chronic ulcer of right heel and midfoot with 03/12/2016 Yes fat layer exposed L97.812 Non-pressure chronic ulcer of other part of right lower leg 03/12/2016 Yes with fat layer exposed Inactive Problems Resolved Problems Electronic Signature(s) Signed: 03/12/2016 10:20:04 AM By: Evlyn Kanner MD, FACS Entered By: Evlyn Kanner on 03/12/2016 10:20:04 [name] ([id number]) -------------------------------------------------------------------------------- Progress Note Details Patient Name: [name] Date of Service: 03/12/2016 9:30 AM Medical Record Number: [id number] Patient Account Number: [id number] Date of Birth/Sex: 05/13/1921 (82 y.o. Female) Treating RN: Phillis Haggis Primary Care Physician: Terance Hart, DAVID Other Clinician: Referring Physician: Dorothey Baseman Treating Physician/Extender: Rudene Re in Treatment: 0 Subjective Chief Complaint Information obtained from Patient 82 year old patient who is very well-known to as returns after a recent admission to hospital and his prolonged stay in the nursing home. History of Present Illness (HPI) The following HPI elements were documented for the patient's wound: Location: right medial heel and right second toe Quality: Patient reports experiencing a dull pain to affected area(s). Severity: Patient states wound are getting worse. Duration: Patient has had the wound for > 6months prior to seeking treatment  at the wound center Timing: Pain in wound is Intermittent (comes and goes Context: The wound appeared gradually over time Modifying Factors: Consults to this date include:vascular surgeon and several recent surgeries. A pleasant 82 year old patient who is very well-known  to my practice was seen about a month and a half ago until she was admitted to hospital and is known to have diabetes mellitus for several years has recently gone through a series of operations with the vascular surgeon Dr. Gilda Crease. In January and February she had several surgeries on the left lower extremity with an attempt to have limb salvage for gangrenous changes of her forefoot. 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 On 01/07/2015 she also had a right lower extremity distal runoff with a angioplasty of the right anterior tibial artery to maximize her blood flow to the foot. She recently had her staples removed at Dr. Marijean Heath office on 01/31/2015 and at that time a right lower extremity duplex was done which showed patent vessels and a patent stent with distal occluded posterior tibial artery. Though the duplex was noncritical the recommendations from the PA at the vascular surgery office was that of a angiogram to be done but the patient said she would rather try some bone care before. The patient has received doxycycline and Cipro in the recent past and she takes oral medications for her diabetes. Other than that I reviewed her list of all her medications. No recent hemoglobin A1c has been done and no recent x-rays of the right foot have been done. 02/18/2015 -- x-ray of the right foot was done on 02/11/2015 and it shows no evidence of acute osteomyelitis of the third toe or of the calcaneus. She had gone to the vascular surgery office and they had noted that there is dehiscence of the left part of the amputation site of the below-knee  wound and they have asked [place] to kindly take over the care of this. There've been doing dressings for the right heel and right third toe. 02/25/2015 -- we have received notes from the vascular group who saw her last on 02/13/2015 and she Chalker, Desarai ([id number]) was seen by the PA Ms. Cleda Daub. She had recommended that the patient continue to follow with [place] for wound care including management of the left BKA stump, which has had dehiscence of the lateral part. They will consider repeating a arterial duplex or angiogram if the right lower extremity does not heal within a reasonable period of time. 03/18/2015 -- he saw the vascular surgeon Dr. Gilda Crease and he has set her up for an angioplasty sometime in the middle of July. she is doing well otherwise. 04/10/2015 -- the patient had a procedure done on 04/08/2015 and this was a right angioplasty of the dorsalis pedis, anterior tibial artery, first superficial femoral artery in its midportion. There was successful intervention with recanalization and in-line flow to the right foot. 04/22/2015 -- the patient was looking rather pale today and the daughter confirms that her hemoglobin was down to 6.9. she is being monitored by her PCP. Her Lasix dose was increased and her edema has gone down significantly. 04/29/2015 -- she had vascular studies done and the ABI on the right is 1.3 and the toe pressures were within normal limits. Her hemoglobin is still around 7 and she refuses to take any blood transfusions for religious reasons. Her potassium was normal. 06/10/2015 -- she has a new blister on her right lateral and posterior part of her leg just in the region where the Kerlix bandages were applied and this may be due to an abrasion. 06/24/2015 -- On her right lower extremity she has developed several more  blisters which look like small pustules and the drain informed shallow ulcerations. 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 07/01/2015 --  she has an appointment with the PCP this Friday and her vascular surgeon on Monday. Other than that she is on doxycycline and has finished 1 week of treatment. The pustules she had all over have now resolved. 07/08/2015 -- she saw the vascular surgeon who did studies in the office and found that there is poor circulation in the distal lower right leg and has set her up for an angiogram and possible stenting next Tuesday. 07/22/2015 -- on October 18 she was taken up for a successful revascularization of the anterior tibial vessel on the right side. a PTA and stent placement was done to the right anterior tibial artery. 08/19/2015 -- she has broken out with some linear ulceration in the region of her webspaces of her toes and this is something new. 08/26/2015 -- over the last 3 days there was a streak of redness going from her lower extremity towards her toes but she had no fever or discharge from the wounds. 09/02/2015 -- the redness and cellulitis has gone down but she continues to have a lot of edema of the right lower extremity. 10/28/2015 -- over the last week she has been draining a lot of fluid from her right lower extremities and several of the wounds which had healed in the past and now opened up again 11/06/2015 -- she was seen in the vascular office and the right ABI was more than 1.3 which was consistent with noncompressible arteries due to medial calcification and the right great toe pressure was 0.24 and the PPG waveform showed significant decrease. from talking to the family member I believe no procedure has been planned. She was also seen by her PCP was increased her Lasix to 80 mg a day and has ordered some lab work but these reports are not back. 11/20/2015 -- Recent labs done hemoglobin A1c was 6.2, INR was .93, iron-binding capacity was 310, BMP was within normal limits except for the BUN which was 40 and albumin was 3.1. Her hemoglobin is up to [facility], [place]  ([id number]) 10.7 with a hematocrit of 34.2 he was advised lymphedema pumps by Dr. Gilda Crease and she has not been wearing these. Her daughter-in- law also says that she has been very noncompliant about elevation of her limbs. 12/18/2015 -- she was seen by Dr. Gilda Crease who agreed with the management and did not think she required any intervention. He discussed elevation and use of lymph pump and will see her back as required. 01/15/2016 -- we have been seeing help me every other week and from what I understand from the family member she is quite stubborn and noncompliant and does not use any elevation of follow the advice saying most medications and applications are causing her pain. 03/12/2016 -- was admitted to the hospital on 02/03/2016 with a right lower extremity cellulitis. She was put on vancomycin and Azactam and metronidazole. She was seen by Dr. Wyn Quaker who performed a aortogram and selective right lower extremity angiogram with a angioplasty of the proximal right anterior tibial artery and right posterior tibial artery and peroneal artery. She was also seen by Dr. Sampson Goon of infectious disease who recommended changing over to ciprofloxacin 500 mg by mouth twice a day for 7 more days and doxycycline 100 mg by mouth twice a day for 7 more days. She had 9 days of IV antibiotics including vancomycin, aztreonam and Flagyl and the  wound was improving. He recommended that she follow-up with me at the wound center and if her leg was worsening repeat cultures could be done and he would make appropriate changes to the antibiotics. She was discharged home on 02/13/2016 with oral ciprofloxacin and doxycycline. She was seen in follow-up on 02/27/2016 at the vascular office for a postop visit status post right lower extremity angiogram and the postprocedure course was found to be uneventful. He was told to come back in a month for an ABI and right lower extremity duplex study to assess her PAD. of note  recent x-rays done on April 27 and May 17 did not show any osteomyelitis of the second toe x-ray of the right foot -- IMPRESSION:Findings compatible with cellulitis of the second toe. No objective evidence of osteomyelitis but there is severe diffuse osteopenia which limits sensitivity of the study. Wound History Patient presents with 1 open wound that has been present for approximately 1 year. Patient has been treating wound in the following manner: silver alginate. Laboratory tests have not been performed in the last month. Patient reportedly has not tested positive for an antibiotic resistant organism. Patient reportedly has not tested positive for osteomyelitis. Patient reportedly has had testing performed to evaluate circulation in the legs. Patient experiences the following problems associated with their wounds: swelling. Patient History Information obtained from Patient. Allergies cephalexin, tramadol, amoxicillin, prednisolone Family History Diabetes - Child, No family history of Cancer, Heart Disease, Hereditary Spherocytosis, Hypertension, Kidney Disease, Lung Disease, Seizures, Stroke, Thyroid Problems, Tuberculosis. Social History Former smoker - 50 years ago, Alcohol Use - Never, Drug Use - No History, Caffeine Use - Moderate. YOCELIN, VANLUE ([id number]) Medical History Cardiovascular Patient has history of Arrhythmia - AFIB, Congestive Heart Failure Musculoskeletal Patient has history of Gout, Osteoarthritis Medical And Surgical History Notes Musculoskeletal L BKA Review of Systems (ROS) Eyes Complains or has symptoms of Vision Changes - glasses. Ear/Nose/Mouth/Throat [facility] Hematologic/Lymphatic The patient has no complaints or symptoms. Gastrointestinal The patient has no complaints or symptoms. Genitourinary The patient has no complaints or symptoms. Immunological The patient has no complaints or symptoms. Integumentary (Skin) Complains or has symptoms of  Wounds. Neurologic The patient has no complaints or symptoms. Oncologic The patient has no complaints or symptoms. Psychiatric The patient has no complaints or symptoms. medications: I reviewed her most recent list of medications. Objective Constitutional Pulse regular. Respirations normal and unlabored. Afebrile. Vitals Time Taken: 9:56 AM, Pulse: 65 bpm, Respiratory Rate: 16 breaths/min, Blood Pressure: 127/42 mmHg. Eyes Mathieson, Sorcha ([id number]) Nonicteric. Reactive to light. Ears, Nose, Mouth, and Throat Lips, teeth, and gums WNL.Marland Kitchen Moist mucosa without lesions. Neck supple and nontender. No palpable supraclavicular or cervical adenopathy. Normal sized without goiter. Respiratory WNL. No retractions.. Cardiovascular ABIs were not measured today as she has a upcoming arterial duplex pending and has had recent vascular surgery. the lymphedema is much better and it's a stage I. Chest Breasts symmetical and no nipple discharge.. Breast tissue WNL, no masses, lumps, or tenderness.. Gastrointestinal (GI) Abdomen without masses or tenderness.. No liver or spleen enlargement or tenderness.. Genitourinary (GU) No hydrocele, spermatocele, tenderness of the cord, or testicular mass.Marland Kitchen Penis without lesions.Renetta Chalk without lesions. No cystocele, or rectocele. Pelvic support intact, no discharge.Marland Kitchen Urethra without masses, tenderness or scarring.Marland Kitchen Lymphatic No adneopathy. No adenopathy. No adenopathy. Musculoskeletal Adexa without tenderness or enlargement.. Digits and nails w/o clubbing, cyanosis, infection, petechiae, ischemia, or inflammatory conditions.Marland Kitchen Psychiatric Judgement and insight Intact.. No evidence of depression, anxiety, or agitation.. General Notes:  overall the right lower extremity is looking excellent with the edema and the cellulitis haven't completely gone down with prolonged inpatient care. There are a few scabs and eschars on the right lower extremity and the  right heel shows a tenderness possibly due to minimal compression injury. The right second toe at the proximal PIP joint has an ulceration which has minimal necrotic debris. There is also superficial ulceration on the medial part of the first metatarsal head. She has got signs and symptoms of gout. Integumentary (Hair, Skin) No suspicious lesions. No crepitus or fluctuance. No peri-wound warmth or erythema. No masses.. Wound #11 status is Open. Original cause of wound was Not Known. The wound is located on the Right Toe Second. The wound measures 0.7cm length x 0.7cm width x 0.1cm depth; 0.385cm^2 area and 0.038cm^3 volume. The wound is limited to skin breakdown. There is no tunneling or undermining noted. There is a medium amount of serous drainage noted. The wound margin is flat and intact. There is no Goodchild, Jeda ([id number]) granulation within the wound bed. There is a large (67-100%) amount of necrotic tissue within the wound bed including Eschar and Adherent Slough. The periwound skin appearance exhibited: Localized Edema, Erythema. The surrounding wound skin color is noted with erythema which is circumferential. Periwound temperature was noted as No Abnormality. The periwound has tenderness on palpation. Wound #12 status is Open. Original cause of wound was Pressure Injury. The wound is located on the Right,Dorsal Toe Second. The wound measures 0.7cm length x 0.7cm width x 0.1cm depth; 0.385cm^2 area and 0.038cm^3 volume. The wound is limited to skin breakdown. There is no tunneling or undermining noted. There is a medium amount of serosanguineous drainage noted. The wound margin is flat and intact. There is no granulation within the wound bed. There is a large (67-100%) amount of necrotic tissue within the wound bed including Eschar and Adherent Slough. The periwound skin appearance exhibited: Localized Edema, Erythema. The surrounding wound skin color is noted with erythema which is  circumferential. Periwound temperature was noted as No Abnormality. The periwound has tenderness on palpation. Wound #13 status is Open. Original cause of wound was Pressure Injury. The wound is located on the Right,Medial Metatarsal head first. The wound measures 0.4cm length x 0.3cm width x 0.1cm depth; 0.094cm^2 area and 0.009cm^3 volume. The wound is limited to skin breakdown. There is no tunneling or undermining noted. There is a medium amount of serosanguineous drainage noted. The wound margin is flat and intact. There is no granulation within the wound bed. There is a large (67-100%) amount of necrotic tissue within the wound bed including Eschar and Adherent Slough. The periwound skin appearance exhibited: Localized Edema, Erythema. The surrounding wound skin color is noted with erythema which is circumferential. Periwound temperature was noted as No Abnormality. The periwound has tenderness on palpation. Wound #14 status is Open. Original cause of wound was Gradually Appeared. The wound is located on the Right,Posterior Lower Leg. The wound measures 11.5cm length x 10cm width x 0.1cm depth; 90.321cm^2 area and 9.032cm^3 volume. The wound is limited to skin breakdown. There is no tunneling or undermining noted. There is a medium amount of serous drainage noted. The wound margin is flat and intact. There is no granulation within the wound bed. There is a large (67-100%) amount of necrotic tissue within the wound bed including Eschar. The periwound skin appearance exhibited: Localized Edema. Periwound temperature was noted as No Abnormality. The periwound has tenderness on palpation. 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 Thorough review of her notes and inpatient care I have recommended: 1. Continue with silver alginate over the wounds and a light Kerlix wrap 2. Offloading of the heel and flotation as much as possible. She may use a Sage boat from time to time 3. Adequate protein and good control of her diabetic diet 4. Vitamin K, vitamin C and zinc He is on complex care and will be seen back as needed every couple of weeks. Plan Wound Cleansing: Wound #12 Metatarsal head second: Clean wound with Normal Saline. Wound #13 Right,Medial Metatarsal head first: Clean wound with Normal Saline. Wound #14 Right,Posterior Lower Leg: Clean wound with Normal Saline. Anesthetic: Wound #12 Metatarsal head second: Topical Lidocaine 4% cream applied to wound bed prior to debridement - for clinic use Wound #13 Right,Medial Metatarsal head first: Topical Lidocaine 4% cream applied to wound bed prior to debridement - for clinic use Wound #14 Right,Posterior Lower Leg: Topical Lidocaine 4% cream applied to wound bed prior to debridement - for clinic use Primary Wound Dressing: Wound #12 Metatarsal head second: Aquacel Ag Wound #13 Right,Medial Metatarsal head first: Aquacel Ag Wound #14 Right,Posterior Lower Leg: Aquacel Ag Secondary Dressing: Wound #12 Metatarsal head second: Gauze and Kerlix/Conform - netting Wound #13 Right,Medial Metatarsal head first: Gauze and Kerlix/Conform - netting Wound #14 Right,Posterior Lower Leg: Gauze and Kerlix/Conform - netting Dressing Change Frequency: Wound #12 Metatarsal head second: Change dressing every other day. Wound #13 Right,Medial Metatarsal head first: Change dressing every other day. Wound #14 Right,Posterior Lower Leg: Change  dressing every other day. Follow-up Appointments: NAIMAH, YINGST ([id number]) Wound #12 Metatarsal head second: Return Appointment in 1 week. Wound #13 Right,Medial Metatarsal head first: Return Appointment in 1 week. Wound #14 Right,Posterior Lower Leg: Return Appointment in 1 week. Edema Control: Wound #12 Metatarsal head second: Elevate legs to the level of the heart and pump ankles as often as possible Wound #13 Right,Medial Metatarsal head first: Elevate legs to the level of the heart and pump ankles as often as possible Wound #14 Right,Posterior Lower Leg: Elevate legs to the level of the heart and pump ankles as often as possible Off-Loading: Wound #12 Metatarsal head second: Turn and reposition every 2 hours Other: - sage boot Wound #13 Right,Medial Metatarsal head first: Turn and reposition every 2 hours Other: - sage boot Wound #14 Right,Posterior Lower Leg: Turn and reposition every 2 hours Other: - sage boot Additional Orders / Instructions: Wound #12 Metatarsal head second: Increase protein intake. Wound #13 Right,Medial Metatarsal head first: Increase protein intake. Wound #14 Right,Posterior Lower Leg: Increase protein intake. Home Health: Wound #12 Metatarsal head second: Continue Home Health Visits - [facility] Nurse may visit PRN to address patient s wound care needs. FACE TO FACE ENCOUNTER: MEDICARE and MEDICAID PATIENTS: I certify that this patient is under my care and that I had a face-to-face encounter that meets the physician face-to-face encounter requirements with this patient on this date. The encounter with the  patient was in whole or in part for the following MEDICAL CONDITION: (primary reason for Home Healthcare) MEDICAL NECESSITY: I certify, that based on my findings, NURSING services are a medically necessary home health service. HOME BOUND STATUS: I certify that my clinical findings support that this patient is homebound (i.e., Due  to illness or injury, pt requires aid of supportive devices such as crutches, cane, wheelchairs, walkers, the use of special transportation or the assistance of another person to leave their place of residence. There is a normal inability to leave the home and doing so requires considerable and taxing effort. Other absences are for medical reasons / religious services and are infrequent or of short duration when for other reasons). If current dressing causes regression in wound condition, may D/C ordered dressing product/s and apply Normal Saline Moist Dressing daily until next Wound Healing Center / Other MD appointment. Notify Wound Healing Center of regression in wound condition at [phone]. Please direct any NON-WOUND related issues/requests for orders to patient's Primary Care Physician Wound #13 Right,Medial Metatarsal head first: Continue Home Health Visits - [facility] Nurse may visit PRN to address patient s wound care needs. LOBB, Damary ([id number]) FACE TO FACE ENCOUNTER: MEDICARE and MEDICAID PATIENTS: I certify that this patient is under my care and that I had a face-to-face encounter that meets the physician face-to-face encounter requirements with this patient on this date. The encounter with the patient was in whole or in part for the following MEDICAL CONDITION: (primary reason for Home Healthcare) MEDICAL NECESSITY: I certify, that based on my findings, NURSING services are a medically necessary home health service. HOME BOUND STATUS: I certify that my clinical findings support that this patient is homebound (i.e., Due to illness or injury, pt requires aid of supportive devices such as crutches, cane, wheelchairs, walkers, the use of special transportation or the assistance of another person to leave their place of residence. There is a normal inability to leave the home and doing so requires considerable and taxing effort. Other absences are for medical  reasons / religious services and are infrequent or of short duration when for other reasons). If current dressing causes regression in wound condition, may D/C ordered dressing product/s and apply Normal Saline Moist Dressing daily until next Wound Healing Center / Other MD appointment. Notify Wound Healing Center of regression in wound condition at [phone]. Please direct any NON-WOUND related issues/requests for orders to patient's Primary Care Physician Wound #14 Right,Posterior Lower Leg: Continue Home Health Visits - [facility] Nurse may visit PRN to address patient s wound care needs. FACE TO FACE ENCOUNTER: MEDICARE and MEDICAID PATIENTS: I certify that this patient is under my care and that I had a face-to-face encounter that meets the physician face-to-face encounter requirements with this patient on this date. The encounter with the patient was in whole or in part for the following MEDICAL CONDITION: (primary reason for Home Healthcare) MEDICAL NECESSITY: I certify, that based on my findings, NURSING services are a medically necessary home health service. HOME BOUND STATUS: I certify that my clinical findings support that this patient is homebound (i.e., Due to illness or injury, pt requires aid of supportive devices such as crutches, cane, wheelchairs, walkers, the use of special transportation or the assistance of another person to leave their place of residence. There is a normal inability to leave the home and doing so requires considerable and taxing effort. Other absences are for medical reasons / religious services and are infrequent  or of short duration when for other reasons). If current dressing causes regression in wound condition, may D/C ordered dressing product/s and apply Normal Saline Moist Dressing daily until next Wound Healing Center / Other MD appointment. Notify Wound Healing Center of regression in wound condition at [phone]. Please direct any  NON-WOUND related issues/requests for orders to patient's Primary Care Physician it was refreshing to see help me back and is much better since her last visit at the end of April. Thorough review of her notes and inpatient care I have recommended: 1. Continue with silver alginate over the wounds and a light Kerlix wrap 2. Offloading of the heel and flotation as much as possible. She may use a Sage boat from time to time 3. Adequate protein and good control of her diabetic diet 4. Vitamin K, vitamin C and zinc He is on complex care and will be seen back as needed every couple of weeks. AHLIYA, GLATT ([id number]) Electronic Signature(s) Signed: 03/15/2016 8:45:24 AM By: Evlyn Kanner MD, FACS Previous Signature: 03/15/2016 8:45:16 AM Version By: Evlyn Kanner MD, FACS Previous Signature: 03/12/2016 4:55:40 PM Version By: Evlyn Kanner MD, FACS Previous Signature: 03/12/2016 10:54:24 AM Version By: Evlyn Kanner MD, FACS Previous Signature: 03/12/2016 10:53:37 AM Version By: Evlyn Kanner MD, FACS Entered By: Evlyn Kanner on 03/15/2016 08:45:23 [name] ([id number]) -------------------------------------------------------------------------------- ROS/PFSH Details Patient Name: [name] Date of Service: 03/12/2016 9:30 AM Medical Record Number: [id number] Patient Account Number: [id number] Date of Birth/Sex: Nov 07, 1920 (82 y.o. Female) Treating RN: Ashok Cordia, Debi Primary Care Physician: Terance Hart, DAVID Other Clinician: Referring Physician: Terance Hart, DAVID Treating Physician/Extender: Rudene Re in Treatment: 0 Information Obtained From Patient Wound History Do you currently have one or more open woundso Yes How many open wounds do you currently haveo 1 Approximately how long have you had your woundso 1 year How have you been treating your wound(s) until nowo silver alginate Has your wound(s) ever healed and then re-openedo No Have you had any lab work done in the  past montho No Have you tested positive for an antibiotic resistant organism (MRSA, VRE)o No Have you tested positive for osteomyelitis (bone infection)o No Have you had any tests for circulation on your legso Yes Who ordered the testo Dr. Gilda Crease Have you had other problems associated with your woundso Swelling Eyes Complaints and Symptoms: Positive for: Vision Changes - glasses Medical History: Negative for: Cataracts; Glaucoma; Optic Neuritis Integumentary (Skin) Complaints and Symptoms: Positive for: Wounds Medical History: Negative for: History of Burn; History of pressure wounds Ear/Nose/Mouth/Throat Complaints and Symptoms: Review of System Notes: HOH Medical History: Negative for: Chronic sinus problems/congestion; Middle ear problems Hematologic/Lymphatic Watson, Onesti ([id number]) Complaints and Symptoms: No Complaints or Symptoms Medical History: Negative for: Anemia; Hemophilia; Human Immunodeficiency Virus; Lymphedema; Sickle Cell Disease Respiratory Medical History: Negative for: Aspiration; Asthma; Chronic Obstructive Pulmonary Disease (COPD); Pneumothorax; Sleep Apnea; Tuberculosis Cardiovascular Medical History: Positive for: Arrhythmia - AFIB; Congestive Heart Failure; Deep Vein Thrombosis; Peripheral Arterial Disease - RLE Negative for: Angina; Coronary Artery Disease; Hypertension; Hypotension; Myocardial Infarction; Peripheral Venous Disease; Phlebitis; Vasculitis Gastrointestinal Complaints and Symptoms: No Complaints or Symptoms Medical History: Negative for: Cirrhosis ; Colitis; Crohnos; Hepatitis A; Hepatitis B; Hepatitis C Endocrine Medical History: Positive for: Type II Diabetes Negative for: Type I Diabetes Time with diabetes: 20+ years Treated with: Oral agents Blood sugar tested every day: Yes Tested : QAM Genitourinary Complaints and Symptoms: No Complaints or Symptoms Medical History: Negative for: End Stage Renal  Disease Immunological Complaints and Symptoms:  No Complaints or Symptoms Medical HistoryAamirah Salmi, Fusae ([id number]) Negative for: Lupus Erythematosus; Raynaudos; Scleroderma Musculoskeletal Medical History: Positive for: Gout; Osteoarthritis Negative for: Rheumatoid Arthritis; Osteomyelitis Past Medical History Notes: L BKA Neurologic Complaints and Symptoms: No Complaints or Symptoms Medical History: Negative for: Dementia; Neuropathy; Quadriplegia; Paraplegia; Seizure Disorder Oncologic Complaints and Symptoms: No Complaints or Symptoms Medical History: Negative for: Received Chemotherapy; Received Radiation Psychiatric Complaints and Symptoms: No Complaints or Symptoms Medical History: Negative for: Anorexia/bulimia; Confinement Anxiety Family and Social History Cancer: No; Diabetes: Yes - Child; Heart Disease: No; Hereditary Spherocytosis: No; Hypertension: No; Kidney Disease: No; Lung Disease: No; Seizures: No; Stroke: No; Thyroid Problems: No; Tuberculosis: No; Former smoker - 50 years ago; Alcohol Use: Never; Drug Use: No History; Caffeine Use: Moderate; Financial Concerns: No; Food, Clothing or Shelter Needs: No; Support System Lacking: No; Transportation Concerns: No; Advanced Directives: No; Patient does not want information on Advanced Directives; Do not resuscitate: No; Living Will: No; Medical Power of Attorney: No Physician Affirmation I have reviewed and agree with the above information. Electronic Signature(s) Signed: 03/12/2016 10:46:31 AM By: Evlyn Kanner MD, FACS Signed: 03/12/2016 5:42:36 PM By: Alejandro Mulling Previous Signature: 03/12/2016 10:20:25 AM Version By: Evlyn Kanner MD, FACS Entered By: Evlyn Kanner on 03/12/2016 10:46:29 [name] ([id number]) Marita [place], Eyvonne Left ([id number]) -------------------------------------------------------------------------------- SuperBill Details Patient Name: [name] Date of Service:  03/12/2016 Medical Record Number: [id number] Patient Account Number: [id number] Date of Birth/Sex: 08-Oct-1920 (82 y.o. 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 L97.812 Non-pressure chronic ulcer of other part of right lower leg with fat layer exposed Facility Procedures CPT4 Code: 28413244 Description: 01027 - WOUND CARE VISIT-LEV 5 EST PT Modifier: Quantity: 1 Physician Procedures CPT4: Description Modifier Quantity Code 2536644 99214 - WC PHYS LEVEL 4 - EST PT 1 ICD-10 Description Diagnosis E11.622 Type 2 diabetes mellitus with other skin ulcer I70.235 Atherosclerosis of native arteries of right leg with ulceration of other part  of foot 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

## 2016-03-13 NOTE — Progress Notes (Signed)
[name], [name] ([id number]) Visit Report for 03/12/2016 Abuse/Suicide Risk Screen Details Patient Name: [name], [name] Date of Service: 03/12/2016 9:30 AM Medical Record Number: [id number] Patient Account Number: [id number] Date of Birth/Sex: 10/13/1920 (82 y.o. Female) Treating RN: Phillis Haggis Primary Care Physician: Terance Hart, DAVID Other Clinician: Referring Physician: Terance Hart, DAVID Treating Physician/Extender: Rudene Re in Treatment: 0 Abuse/Suicide Risk Screen Items Answer ABUSE/SUICIDE RISK SCREEN: Has anyone close to you tried to hurt or harm you recentlyo No Do you feel uncomfortable with anyone in your familyo No Has anyone forced you do things that you didnot want to doo No Do you have any thoughts of harming yourselfo No Patient displays signs or symptoms of abuse and/or neglect. No Electronic Signature(s) Signed: 03/12/2016 5:42:36 PM By: Alejandro Mulling Entered By: Alejandro Mulling on 03/12/2016 10:02:33 [name] ([id number]) -------------------------------------------------------------------------------- Activities of Daily Living Details Patient Name: [name] Date of Service: 03/12/2016 9:30 AM Medical Record Number: [id number] Patient Account Number: [id number] Date of Birth/Sex: 01-11-1921 (82 y.o. Female) Treating RN: Phillis Haggis Primary Care Physician: Terance Hart, DAVID Other Clinician: Referring Physician: Dorothey Baseman Treating Physician/Extender: Rudene Re in Treatment: 0 Activities of Daily Living Items Answer Activities of Daily Living (Please select one for each item) Drive Automobile Not Able Take Medications Need Assistance Use Telephone Need Assistance Care for Appearance Need Assistance Use Toilet Need Assistance Bath / Shower Need Assistance Dress Self Need Assistance Feed Self Completely Able Walk Not Able Get In / Out Bed Need Assistance Housework Not Able Prepare Meals Not Able Handle Money Not  Able Shop for Self Not Able Electronic Signature(s) Signed: 03/12/2016 5:42:36 PM By: Alejandro Mulling Entered By: Alejandro Mulling on 03/12/2016 10:03:05 [name] ([id number]) -------------------------------------------------------------------------------- Education Assessment Details Patient Name: [name] Date of Service: 03/12/2016 9:30 AM Medical Record Number: [id number] Patient Account Number: [id number] Date of Birth/Sex: 1921/07/02 (82 y.o. Female) Treating RN: Phillis Haggis Primary Care Physician: Terance Hart, DAVID Other Clinician: Referring Physician: Dorothey Baseman Treating Physician/Extender: Rudene Re in Treatment: 0 Primary Learner Assessed: Patient Learning Preferences/Education Level/Primary Language Learning Preference: Explanation Highest Education Level: High School Preferred Language: English Cognitive Barrier Assessment/Beliefs Language Barrier: No Translator Needed: No Memory Deficit: No Emotional Barrier: No Cultural/Religious Beliefs Affecting Medical No Care: Physical Barrier Assessment Impaired Vision: Yes Glasses Impaired Hearing: Yes HOH Knowledge/Comprehension Assessment Knowledge Level: High Comprehension Level: High Ability to understand written High instructions: Ability to understand verbal High instructions: Motivation Assessment Anxiety Level: Calm Cooperation: Cooperative Education Importance: Acknowledges Need Interest in Health Problems: Asks Questions Perception: Coherent Willingness to Engage in Self- High Management Activities: Readiness to Engage in Self- High Management Activities: Electronic Signature(s) Signed: 03/12/2016 5:42:36 PM By: Stacy Gardner, Kissa ([id number]) Entered By: Alejandro Mulling on 03/12/2016 10:03:45 [name] ([id number]) -------------------------------------------------------------------------------- Fall Risk Assessment Details Patient Name: [name] Date of Service: 03/12/2016 9:30 AM Medical Record Number: [id number] Patient Account Number: [id number] Date of Birth/Sex: 01/26/1921 (82 y.o. Female) Treating RN: Ashok Cordia, Debi Primary Care Physician: Terance Hart, DAVID Other Clinician: Referring Physician: Terance Hart, DAVID Treating Physician/Extender: Rudene Re in Treatment: 0 Fall Risk Assessment Items Have you had 2 or more falls in the last 12 monthso 0 Yes Have you had any fall that resulted in injury in the last 12 monthso 0 Yes FALL RISK ASSESSMENT: History of falling - immediate or within 3 months 25 Yes Secondary diagnosis 15 Yes Ambulatory aid None/bed rest/wheelchair/nurse 0 Yes Crutches/cane/walker 0 No Furniture 0 No IV Access/Saline Lock 0 No Gait/Training Normal/bed rest/immobile 0 No Weak  10 Yes Impaired 20 Yes Mental Status Oriented to own ability 0 No Electronic Signature(s) Signed: 03/12/2016 5:42:36 PM By: Alejandro MullingPinkerton, Debra Entered By: Alejandro MullingPinkerton, Debra on 03/12/2016 10:04:28 [name], [name] ([id number]) -------------------------------------------------------------------------------- Foot Assessment Details Patient Name: [name], [name] Date of Service: 03/12/2016 9:30 AM Medical Record Number: [id number] Patient Account Number: [id number] Date of Birth/Sex: 04/11/1921 (82 y.o. Female) Treating RN: Phillis HaggisPinkerton, Debi Primary Care Physician: Terance HartBRONSTEIN, DAVID Other Clinician: Referring Physician: Terance HartBRONSTEIN, DAVID Treating Physician/Extender: Rudene ReBritto, Errol Weeks in Treatment: 0 Foot Assessment Items Site Locations + = Sensation present, - = Sensation absent, C = Callus, U = Ulcer R = Redness, W = Warmth, M = Maceration, PU = Pre-ulcerative lesion F = Fissure, S = Swelling, D = Dryness Assessment Right: Left: Other Deformity: No No Prior Foot Ulcer: No No Prior Amputation: No No Charcot Joint: No No Ambulatory Status: Non-ambulatory Assistance Device: Wheelchair Gait: Unsteady Notes left  BKA Electronic Signature(s) Signed: 03/12/2016 5:42:36 PM By: Alejandro MullingPinkerton, Debra Entered By: Alejandro MullingPinkerton, Debra on 03/12/2016 10:06:09 [name], [name] ([id number]) [name], [name] ([id number]) -------------------------------------------------------------------------------- Nutrition Risk Assessment Details Patient Name: [name], [name] Date of Service: 03/12/2016 9:30 AM Medical Record Number: [id number] Patient Account Number: [id number] Date of Birth/Sex: 03/04/1921 (82 y.o. Female) Treating RN: Phillis HaggisPinkerton, Debi Primary Care Physician: Dorothey BasemanBRONSTEIN, DAVID Other Clinician: Referring Physician: Terance HartBRONSTEIN, DAVID Treating Physician/Extender: Rudene ReBritto, Errol Weeks in Treatment: 0 Height (in): Weight (lbs): Body Mass Index (BMI): Nutrition Risk Assessment Items NUTRITION RISK SCREEN: I have an illness or condition that made me change the kind and/or 2 Yes amount of food I eat I eat fewer than two meals per day 0 No I eat few fruits and vegetables, or milk products 0 No I have three or more drinks of beer, liquor or wine almost every day 0 No I have tooth or mouth problems that make it hard for me to eat 0 No I don't always have enough money to buy the food I need 0 No I eat alone most of the time 0 No I take three or more different prescribed or over-the-counter drugs a 1 Yes day Without wanting to, I have lost or gained 10 pounds in the last six 0 No months I am not always physically able to shop, cook and/or feed myself 2 Yes Nutrition Protocols Good Risk Protocol Moderate Risk Protocol Electronic Signature(s) Signed: 03/12/2016 5:42:36 PM By: Alejandro MullingPinkerton, Debra Entered By: Alejandro MullingPinkerton, Debra on 03/12/2016 10:04:59

## 2016-03-25 ENCOUNTER — Encounter: Payer: Medicare Other | Admitting: Surgery

## 2016-03-25 DIAGNOSIS — E11622 Type 2 diabetes mellitus with other skin ulcer: Secondary | ICD-10-CM | POA: Diagnosis not present

## 2016-03-25 NOTE — Progress Notes (Addendum)
BELA, [name] ([id number]) Visit Report for 03/25/2016 Chief Complaint Document Details Patient Name: [name], [name] Date of Service: 03/25/2016 10:00 AM Medical Record Number: [id number] Patient Account Number: [id number] Date of Birth/Sex: 1921/06/14 (82 y.o. Female) Treating RN: [name] Primary Care Physician: [name] Other Clinician: Referring Physician: Dorothey [name] Treating Physician/Extender: [name] in Treatment: 1 Information Obtained from: Patient Chief Complaint 82 year old patient who is very well-known to as returns after a recent admission to hospital and his prolonged stay in the nursing home. Electronic Signature(s) Signed: 03/25/2016 10:35:36 AM By: [name] Kanner MD, FACS Entered By: [name] on 03/25/2016 10:35:35 [name] ([id number]) -------------------------------------------------------------------------------- HPI Details Patient Name: [name] Date of Service: 03/25/2016 10:00 AM Medical Record Number: [id number] Patient Account Number: [id number] Date of Birth/Sex: 1920-12-17 (82 y.o. Female) Treating RN: [name] Primary Care Physician: [name] Other Clinician: Referring Physician: Dorothey [name] Treating Physician/Extender: [name] in Treatment: 1 History of Present Illness Location: right medial heel and right second toe Quality: Patient reports experiencing a dull pain to affected area(s). Severity: Patient states wound are getting worse. Duration: Patient has had the wound for > 6months prior to seeking treatment at the wound center Timing: Pain in wound is Intermittent (comes and goes Context: The wound appeared gradually over time Modifying Factors: Consults to this date include:vascular surgeon and several recent surgeries. HPI Description: A pleasant 82 year old patient who is very well-known to my practice was seen about a month and a half ago until she was admitted to hospital  and is known to have diabetes mellitus for several years has recently gone through a series of operations with the vascular surgeon Dr. Gilda [name]. In January and February she had several surgeries on [name]. 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 On 01/07/2015 she also had a right lower extremity distal runoff with a angioplasty of the right anterior tibial artery to maximize her blood flow to the foot. She recently had her staples removed at Dr. Marijean [name] office on 01/31/2015 and at that time a right lower extremity duplex was done which showed patent vessels and a patent stent with distal occluded posterior tibial artery. Though the duplex was noncritical the recommendations from the PA at the vascular surgery office was that of a angiogram to be done but the patient said she would rather try some bone care before. The patient has received doxycycline and Cipro in the recent past and she takes oral medications for her diabetes. Other than that I reviewed her list of all her medications. No recent hemoglobin A1c has been done and no recent x-rays of the right foot have been done. 02/18/2015 -- x-ray of the right foot was done on 02/11/2015 and it shows no evidence of acute osteomyelitis of the third toe or of the calcaneus. She had gone to the vascular surgery office and they had noted that there is dehiscence of [name] of the below-knee wound and they have asked [place] to kindly take over the care of this. There've been doing dressings for the right heel and right third toe. 02/25/2015 -- we have received notes from the vascular group who saw her last on 02/13/2015 and she was seen by the PA [name]. She had recommended that the patient continue to follow with [place] for wound  care  including management of [name], which has had dehiscence of the lateral part. They will consider repeating a arterial duplex or angiogram if the right lower extremity does not heal within a reasonable period of time. 03/18/2015 -- he saw the vascular surgeon Dr. Gilda [name] and he has set her up for an angioplasty sometime in the middle of July. she is doing well otherwise. 04/10/2015 -- the patient had a procedure done on 04/08/2015 and this was a right angioplasty of the [name] ([id number]) dorsalis pedis, anterior tibial artery, first superficial femoral artery in its midportion. There was successful intervention with recanalization and in-line flow to the right foot. 04/22/2015 -- the patient was looking rather pale today and the daughter confirms that her hemoglobin was down to 6.9. she is being monitored by her PCP. Her Lasix dose was increased and her edema has gone down significantly. 04/29/2015 -- she had vascular studies done and the ABI on the right is 1.3 and the toe pressures were within normal limits. Her hemoglobin is still around 7 and she refuses to take any blood transfusions for religious reasons. Her potassium was normal. 06/10/2015 -- she has a new blister on her right lateral and posterior part of her leg just in the region where the Kerlix bandages were applied and this may be due to an abrasion. 06/24/2015 -- On her right lower extremity she has developed several more blisters which look like small pustules and the drain informed shallow ulcerations. 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 07/01/2015 -- she has an appointment with the PCP this Friday and her vascular surgeon on Monday. Other than that she is on doxycycline and has finished 1 week of treatment. The pustules she had all over have now resolved. 07/08/2015 -- she saw the vascular surgeon who did studies in the office and found that there is poor circulation in the distal lower right leg and has  set her up for an angiogram and possible stenting next Tuesday. 07/22/2015 -- on October 18 she was taken up for a successful revascularization of the anterior tibial vessel on the right side. a PTA and stent placement was done to the right anterior tibial artery. 08/19/2015 -- she has broken out with some linear ulceration in the region of her webspaces of her toes and this is something new. 08/26/2015 -- over the last 3 days there was a streak of redness going from her lower extremity towards her toes but she had no fever or discharge from the wounds. 09/02/2015 -- the redness and cellulitis has gone down but she continues to have a lot of edema of the right lower extremity. 10/28/2015 -- over the last week she has been draining a lot of fluid from her right lower extremities and several of the wounds which had healed in the past and now opened up again 11/06/2015 -- she was seen in the vascular office and the right ABI was more than 1.3 which was consistent with noncompressible arteries due to medial calcification and the right great toe pressure was 0.24 and the PPG waveform showed significant decrease. from talking to the family member I believe no procedure has been planned. She was also seen by her PCP was increased her Lasix to 80 mg a day and has ordered some lab work but these reports are not back. 11/20/2015 -- Recent labs done hemoglobin A1c was 6.2, INR was .93, iron-binding capacity was 310, BMP was within normal limits except for the BUN which  was 40 and albumin was 3.1. Her hemoglobin is up to 10.7 with a hematocrit of 34.2 he was advised lymphedema pumps by Dr. Gilda [name] and she has not been wearing these. Her daughter-in- law also says that she has been very noncompliant about elevation of her limbs. 12/18/2015 -- she was seen by Dr. Gilda [name] who agreed with the management and did not think she required any intervention. He discussed elevation and use of lymph pump and will see  her back as required. 01/15/2016 -- we have been seeing help me every other week and from what I understand from the family member she is quite stubborn and noncompliant and does not use any elevation of follow the advice saying [name], [name] ([id number]) most medications and applications are causing her pain. 03/12/2016 -- was admitted to the hospital on 02/03/2016 with a right lower extremity cellulitis. She was put on vancomycin and Azactam and metronidazole. She was seen by Dr. Wyn Quaker who performed a aortogram and selective right lower extremity angiogram with a angioplasty of the proximal right anterior tibial artery and right posterior tibial artery and peroneal artery. She was also seen by Dr. Sampson Goon of infectious disease who recommended changing over to ciprofloxacin 500 mg by mouth twice a day for 7 more days and doxycycline 100 mg by mouth twice a day for 7 more days. She had 9 days of IV antibiotics including vancomycin, aztreonam and Flagyl and the wound was improving. He recommended that she follow-up with me at the wound center and if her leg was worsening repeat cultures could be done and he would make appropriate changes to the antibiotics. She was discharged home on 02/13/2016 with oral ciprofloxacin and doxycycline. She was seen in follow-up on 02/27/2016 at the vascular office for a postop visit status post right lower extremity angiogram and the postprocedure course was found to be uneventful. He was told to come back in a month for an ABI and right lower extremity duplex study to assess her PAD. of note recent x-rays done on April 27 and May 17 did not show any osteomyelitis of the second toe x-ray of the right foot -- IMPRESSION:Findings compatible with cellulitis of the second toe. No objective evidence of osteomyelitis but there is severe diffuse osteopenia which limits sensitivity of the study. Electronic Signature(s) Signed: 03/25/2016 10:35:46 AM By: [name] Kanner  MD, FACS Entered By: [name] on 03/25/2016 10:35:46 [name] ([id number]) -------------------------------------------------------------------------------- Physical Exam Details Patient Name: [name] Date of Service: 03/25/2016 10:00 AM Medical Record Number: [id number] Patient Account Number: [id number] Date of Birth/Sex: 29-Sep-1920 (82 y.o. Female) Treating RN: [name] Primary Care Physician: [name] Other Clinician: Referring Physician: Dorothey [name] Treating Physician/Extender: [name] in Treatment: 1 Constitutional . Pulse regular. Respirations normal and unlabored. Afebrile. . Eyes Nonicteric. Reactive to light. Ears, Nose, Mouth, and Throat Lips, teeth, and gums WNL.Marland Kitchen Moist mucosa without lesions. Neck supple and nontender. No palpable supraclavicular or cervical adenopathy. Normal sized without goiter. Respiratory WNL. No retractions.. Cardiovascular Pedal Pulses WNL. No clubbing, cyanosis or edema. Chest Breasts symmetical and no nipple discharge.. Breast tissue WNL, no masses, lumps, or tenderness.. Lymphatic No adneopathy. No adenopathy. No adenopathy. Musculoskeletal Adexa without tenderness or enlargement.. Digits and nails w/o clubbing, cyanosis, infection, petechiae, ischemia, or inflammatory conditions.. Integumentary (Hair, Skin) No suspicious lesions. No crepitus or fluctuance. No peri-wound warmth or erythema. No masses.Marland Kitchen Psychiatric Judgement and insight Intact.. No evidence of depression, anxiety, or agitation.. Notes overall the right lower extremity is looking  excellent with the edema and the cellulitis have completely gone down with prolonged inpatient care. There are a few scabs and eschars on the right lower extremity and the right heel shows a tenderness possibly due to minimal compression injury. The right second toe at the proximal PIP joint has completely healed and there is a dry eschar which I have not  removed. there is a small superficial ulceration on the posterior lower calf which we will dress appropriately. No Debridement was required today. Electronic Signature(s) Signed: 03/25/2016 11:39:22 AM By: [name] Kanner MD, FACS Previous Signature: 03/25/2016 10:37:27 AM Version By: [name] Kanner MD, FACS Entered By: [name] on 03/25/2016 11:39:22 [name] ([id number]) [name], [name] ([id number]) -------------------------------------------------------------------------------- Physician Orders Details Patient Name: [name] Date of Service: 03/25/2016 10:00 AM Medical Record Number: [id number] Patient Account Number: [id number] Date of Birth/Sex: [birth year] (82 y.o. 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 o Clean wound with Normal Saline. Wound #13 Right,Medial Metatarsal head first o Clean wound with Normal Saline. o Clean wound with Normal Saline. Wound #14 Right,Posterior Lower Leg o Clean wound with Normal Saline. o Clean wound with Normal Saline. Anesthetic Wound #12 Right,Dorsal Toe Second o Topical Lidocaine 4% cream applied to wound bed prior to debridement - for clinic use Wound #13 Right,Medial Metatarsal head  first o Topical Lidocaine 4% cream applied to wound bed prior to debridement - for clinic use o Topical Lidocaine 4% cream applied to wound bed prior to debridement - for clinic use Wound #14 Right,Posterior Lower Leg o Topical Lidocaine 4% cream applied to wound bed prior to debridement - for clinic use o Topical Lidocaine 4% cream applied to wound bed prior to debridement - for clinic use Primary Wound Dressing Wound #12 Right,Dorsal Toe Second o Other: - Betadine [name], [name] ([id number]) Wound #13 Right,Medial Metatarsal head first o Other: - Betadine Wound #14 Right,Posterior Lower Leg o Aquacel Ag Secondary Dressing Wound #14 Right,Posterior Lower Leg o Gauze and Kerlix/Conform Dressing Change Frequency Wound #14 Right,Posterior Lower Leg o Change dressing every other day. Follow-up Appointments Wound #12 Right,Dorsal Toe Second o Return Appointment in 1 week. Wound #13 Right,Medial Metatarsal head first o Return Appointment in 1 week. Wound #14 Right,Posterior Lower Leg o Return Appointment in 1 week. Edema Control Wound #12 Right,Dorsal Toe Second o Elevate legs to the level of the heart and pump ankles as often as possible Wound #13 Right,Medial Metatarsal head first o Elevate legs to the level of the heart and pump ankles as often as possible Wound #14 Right,Posterior Lower Leg o Elevate legs to the level of the heart and pump ankles as often as possible Off-Loading Wound #12 Right,Dorsal Toe Second o Turn and reposition every 2 hours o Other: - sage boot Wound #13 Right,Medial Metatarsal head first o Turn and reposition every 2 hours o Other: - sage boot Wound #  14 Right,Posterior Lower Leg o Turn and reposition every 2 hours o Other: - sage boot [name], [name] ([id number]) Additional Orders / Instructions Wound #12 Right,Dorsal Toe Second o Increase protein intake. Wound #13 Right,Medial Metatarsal head  first o Increase protein intake. Wound #14 Right,Posterior Lower Leg o Increase protein intake. Home Health Wound #12 Right,Dorsal Toe Second o Continue Home Health Visits - [facility] o Home Health Nurse may visit PRN to address patientos wound care needs. o FACE TO FACE ENCOUNTER: MEDICARE and MEDICAID PATIENTS: I certify that this patient is under my care and that I had a face-to-face encounter that meets the physician face-to-face encounter requirements with this patient on this date. The encounter with the patient was in whole or in part for the following MEDICAL CONDITION: (primary reason for Home Healthcare) MEDICAL NECESSITY: I certify, that based on my findings, NURSING services are a medically necessary home health service. HOME BOUND STATUS: I certify that my clinical findings support that this patient is homebound (i.e., Due to illness or injury, pt requires aid of supportive devices such as crutches, cane, wheelchairs, walkers, the use of special transportation or the assistance of another person to leave their place of residence. There is a normal inability to leave the home and doing so requires considerable and taxing effort. Other absences are for medical reasons / religious services and are infrequent or of short duration when for other reasons). o If current dressing causes regression in wound condition, may D/C ordered dressing product/s and apply Normal Saline Moist Dressing daily until next Wound Healing Center / Other MD appointment. Notify Wound Healing Center of regression in wound condition at [phone]. o Please direct any NON-WOUND related issues/requests for orders to patient's Primary Care Physician Wound #13 Right,Medial Metatarsal head first o Continue Home Health Visits - [facility] o Home Health Nurse may visit PRN to address patientos wound care needs. o FACE TO FACE ENCOUNTER: MEDICARE and MEDICAID PATIENTS: I certify that this  patient is under my care and that I had a face-to-face encounter that meets the physician face-to-face encounter requirements with this patient on this date. The encounter with the patient was in whole or in part for the following MEDICAL CONDITION: (primary reason for Home Healthcare) MEDICAL NECESSITY: I certify, that based on my findings, NURSING services are a medically necessary home health service. HOME BOUND STATUS: I certify that my clinical findings support that this patient is homebound (i.e., Due to illness or injury, pt requires aid of supportive devices such as crutches, cane, wheelchairs, walkers, the use of special transportation or the assistance of another person to leave their place of residence. There is a normal inability to leave the home and doing so requires considerable and taxing effort. Other absences are for medical reasons / religious services and are infrequent or of short duration when for other reasons). [name], [name] ([id number]) o If current dressing causes regression in wound condition, may D/C ordered dressing product/s and apply Normal Saline Moist Dressing daily until next Wound Healing Center / Other MD appointment. Notify Wound Healing Center of regression in wound condition at [phone]. o Please direct any NON-WOUND related issues/requests for orders to patient's Primary Care Physician Wound #14 Right,Posterior Lower Leg o Continue Home Health Visits - [facility] o Home Health Nurse may visit PRN to address patientos wound care needs. o FACE TO FACE ENCOUNTER: MEDICARE and MEDICAID PATIENTS: I certify that this patient is under my care and that I had a face-to-face encounter that meets the  physician face-to-face encounter requirements with this patient on this date. The encounter with the patient was in whole or in part for the following MEDICAL CONDITION: (primary reason for Home Healthcare) MEDICAL NECESSITY: I certify, that based on my  findings, NURSING services are a medically necessary home health service. HOME BOUND STATUS: I certify that my clinical findings support that this patient is homebound (i.e., Due to illness or injury, pt requires aid of supportive devices such as crutches, cane, wheelchairs, walkers, the use of special transportation or the assistance of another person to leave their place of residence. There is a normal inability to leave the home and doing so requires considerable and taxing effort. Other absences are for medical reasons / religious services and are infrequent or of short duration when for other reasons). o If current dressing causes regression in wound condition, may D/C ordered dressing product/s and apply Normal Saline Moist Dressing daily until next Wound Healing Center / Other MD appointment. Notify Wound Healing Center of regression in wound condition at [phone]. o Please direct any NON-WOUND related issues/requests for orders to patient's Primary Care Physician Electronic Signature(s) Signed: 03/25/2016 3:00:23 PM By: Elliot Gurney RN, BSN, Kim RN, BSN Signed: 03/25/2016 4:10:50 PM By: [name] Kanner MD, FACS Entered By: Elliot Gurney RN, BSN, Kim on 03/25/2016 10:56:15 [name] ([id number]) -------------------------------------------------------------------------------- Problem List Details Patient Name: [name] Date of Service: 03/25/2016 10:00 AM Medical Record Number: [id number] Patient Account Number: [id number] Date of Birth/Sex: 25-Aug-1921 (82 y.o. 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 03/12/2016 Yes L97.412 Non-pressure chronic ulcer of right heel and midfoot with 03/12/2016 Yes fat layer exposed L97.812 Non-pressure chronic ulcer of other part of right lower leg 03/12/2016 Yes with fat layer exposed Inactive Problems Resolved Problems Electronic Signature(s) Signed: 03/25/2016 10:34:57 AM By: [name] Kanner MD, FACS Entered By: [name] on 03/25/2016 10:34:57 [name] ([id number]) -------------------------------------------------------------------------------- Progress Note Details Patient Name: [name] Date of Service: 03/25/2016 10:00 AM Medical Record Number: [id number] Patient Account Number: [id number] Date of Birth/Sex: [birth year] (82 y.o. Female) Treating RN: [name] Primary Care Physician: [name] Other Clinician: Referring Physician: Dorothey [name] Treating Physician/Extender: [name] in Treatment: 1 Subjective Chief Complaint Information obtained from Patient 82 year old patient who is very well-known to as returns after a recent admission to hospital and his prolonged stay in the nursing home. History of Present Illness (HPI) The following HPI elements were documented for the patient's wound: Location: right medial heel and right second toe Quality: Patient reports experiencing a dull pain to affected area(s). Severity: Patient states wound are getting worse. Duration: Patient has had the wound for > 6months prior to seeking treatment at the wound center Timing: Pain in wound is Intermittent (comes and goes Context: The wound appeared gradually over time Modifying Factors: Consults to this date include:vascular surgeon and several recent surgeries. A pleasant 82 year old patient who is very well-known to my practice was seen about a month and a half ago until she was admitted to hospital and is known to have diabetes mellitus for several  years has recently gone through a series of operations with the vascular surgeon Dr.  Schnier. In January and February she had several surgeries on [name]. 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 On 01/07/2015 she also had a right lower extremity distal runoff with a angioplasty of the right anterior tibial artery to maximize her blood flow to the foot. She recently had her staples removed at Dr. Marijean HeathSchnier's office on 01/31/2015 and at that time a right lower extremity duplex was done which showed patent vessels and a patent stent with distal occluded posterior tibial artery. Though the duplex was noncritical the recommendations from the PA at the vascular surgery office was that of a angiogram to be done but the patient said she would rather try some bone care before. The patient has received doxycycline and Cipro in the recent past and she takes oral medications for her diabetes. Other than that I reviewed her list of all her medications. No recent hemoglobin A1c has been done and no recent x-rays of the right foot have been done. 02/18/2015 -- x-ray of the right foot was done on 02/11/2015 and it shows no evidence of acute osteomyelitis of the third toe or of the calcaneus. 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 There've been doing dressings for the right heel and right third toe. 02/25/2015 -- we have received notes from the vascular group who saw her last on 02/13/2015 and she [name], [name] ([id number]) was seen by the PA Ms. [name] DaubKimberly Stegmayer. She had recommended that the patient continue to follow with us for wound care including management of the  [name], which has had dehiscence of the lateral part. They will consider repeating a arterial duplex or angiogram if the right lower extremity does not heal within a reasonable period of time. 03/18/2015 -- he saw the vascular surgeon Dr. Gilda CreaseSchnier and he has set her up for an angioplasty sometime in the middle of July. she is doing well otherwise. 04/10/2015 -- the patient had a procedure done on 04/08/2015 and this was a right angioplasty of the dorsalis pedis, anterior tibial artery, first superficial femoral artery in its midportion. There was successful intervention with recanalization and in-line flow to the right foot. 04/22/2015 -- the patient was looking rather pale today and the daughter confirms that her hemoglobin was down to 6.9. she is being monitored by her PCP. Her Lasix dose was increased and her edema has gone down significantly. 04/29/2015 -- she had vascular studies done and the ABI on the right is 1.3 and the toe pressures were within normal limits. Her hemoglobin is still around 7 and she refuses to take any blood transfusions for religious reasons. Her potassium was normal. 06/10/2015 -- she has a new blister on her right lateral and posterior part of her leg just in the region where the Kerlix bandages were applied and this may be due to an abrasion. 06/24/2015 -- On her right lower extremity she has developed several more blisters which look like small pustules and the drain informed shallow ulcerations. 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 07/01/2015 -- she has an appointment with the PCP this Friday and her vascular surgeon on Monday. Other than that she is on  doxycycline and has finished 1 week of treatment. The pustules she had all over have now resolved. 07/08/2015 -- she saw the vascular surgeon who did studies in the office and found that there is poor circulation in the distal lower right leg and has set her up for an angiogram and possible stenting  next Tuesday. 07/22/2015 -- on October 18 she was taken up for a successful revascularization of the anterior tibial vessel on the right side. a PTA and stent placement was done to the right anterior tibial artery. 08/19/2015 -- she has broken out with some linear ulceration in the region of her webspaces of her toes and this is something new. 08/26/2015 -- over the last 3 days there was a streak of redness going from her lower extremity towards her toes but she had no fever or discharge from the wounds. 09/02/2015 -- the redness and cellulitis has gone down but she continues to have a lot of edema of the right lower extremity. 10/28/2015 -- over the last week she has been draining a lot of fluid from her right lower extremities and several of the wounds which had healed in the past and now opened up again 11/06/2015 -- she was seen in the vascular office and the right ABI was more than 1.3 which was consistent with noncompressible arteries due to medial calcification and the right great toe pressure was 0.24 and the PPG waveform showed significant decrease. from talking to the family member I believe no procedure has been planned. She was also seen by her PCP was increased her Lasix to 80 mg a day and has ordered some lab work but these reports are not back. 11/20/2015 -- Recent labs done hemoglobin A1c was 6.2, INR was .93, iron-binding capacity was 310, BMP was within normal limits except for the BUN which was 40 and albumin was 3.1. Her hemoglobin is up to [facility] [place] ([id number]) 10.7 with a hematocrit of 34.2 he was advised lymphedema pumps by Dr. Gilda [name] and she has not been wearing these. Her daughter-in- law also says that she has been very noncompliant about elevation of her limbs. 12/18/2015 -- she was seen by Dr. Gilda [name] who agreed with the management and did not think she required any intervention. He discussed elevation and use of lymph pump and will see her back as  required. 01/15/2016 -- we have been seeing help me every other week and from what I understand from the family member she is quite stubborn and noncompliant and does not use any elevation of follow the advice saying most medications and applications are causing her pain. 03/12/2016 -- was admitted to the hospital on 02/03/2016 with a right lower extremity cellulitis. She was put on vancomycin and Azactam and metronidazole. She was seen by Dr. Wyn Quaker who performed a aortogram and selective right lower extremity angiogram with a angioplasty of the proximal right anterior tibial artery and right posterior tibial artery and peroneal artery. She was also seen by Dr. Sampson Goon of infectious disease who recommended changing over to ciprofloxacin 500 mg by mouth twice a day for 7 more days and doxycycline 100 mg by mouth twice a day for 7 more days. She had 9 days of IV antibiotics including vancomycin, aztreonam and Flagyl and the wound was improving. He recommended that she follow-up with me at the wound center and if her leg was worsening repeat cultures could be done and he would make appropriate changes to the antibiotics. She was discharged home on 02/13/2016 with  oral ciprofloxacin and doxycycline. She was seen in follow-up on 02/27/2016 at the vascular office for a postop visit status post right lower extremity angiogram and the postprocedure course was found to be uneventful. He was told to come back in a month for an ABI and right lower extremity duplex study to assess her PAD. of note recent x-rays done on April 27 and May 17 did not show any osteomyelitis of the second toe x-ray of the right foot -- IMPRESSION:Findings compatible with cellulitis of the second toe. No objective evidence of osteomyelitis but there is severe diffuse osteopenia which limits sensitivity of the study. Objective Constitutional Pulse regular. Respirations normal and unlabored. Afebrile. Vitals Time Taken: 10:04 AM,  Temperature: 97.8 F, Pulse: 55 bpm, Respiratory Rate: 18 breaths/min, Blood Pressure: 135/64 mmHg. Eyes Nonicteric. Reactive to light. Ears, Nose, Mouth, and Throat Lips, teeth, and gums WNL.Marland Kitchen Moist mucosa without lesions. Neck supple and nontender. No palpable supraclavicular or cervical adenopathy. Normal sized without goiter. [name], [name] ([id number]) Respiratory WNL. No retractions.. Cardiovascular Pedal Pulses WNL. No clubbing, cyanosis or edema. Chest Breasts symmetical and no nipple discharge.. Breast tissue WNL, no masses, lumps, or tenderness.. Lymphatic No adneopathy. No adenopathy. No adenopathy. Musculoskeletal Adexa without tenderness or enlargement.. Digits and nails w/o clubbing, cyanosis, infection, petechiae, ischemia, or inflammatory conditions.Marland Kitchen Psychiatric Judgement and insight Intact.. No evidence of depression, anxiety, or agitation.. General Notes: overall the right lower extremity is looking excellent with the edema and the cellulitis have completely gone down with prolonged inpatient care. There are a few scabs and eschars on the right lower extremity and the right heel shows a tenderness possibly due to minimal compression injury. The right second toe at the proximal PIP joint has completely healed and there is a dry eschar which I have not removed. there is a small superficial ulceration on the posterior lower calf which we will dress appropriately. No Debridement was required today. Integumentary (Hair, Skin) No suspicious lesions. No crepitus or fluctuance. No peri-wound warmth or erythema. No masses.. Wound #12 status is Open. Original cause of wound was Pressure Injury. The wound is located on the Right,Dorsal Toe Second. The wound measures 0.5cm length x 0.7cm width x 0.1cm depth; 0.275cm^2 area and 0.027cm^3 volume. The wound is limited to skin breakdown. There is a medium amount of serosanguineous drainage noted. The wound margin is flat and  intact. There is medium (34-66%) granulation within the wound bed. There is a small (1-33%) amount of necrotic tissue within the wound bed including Eschar. The periwound skin appearance exhibited: Dry/Scaly. The periwound skin appearance did not exhibit: Callus, Crepitus, Excoriation, Fluctuance, Friable, Induration, Localized Edema, Rash, Scarring, Maceration, Moist, Atrophie Blanche, Cyanosis, Ecchymosis, Hemosiderin Staining, Mottled, Pallor, Rubor, Erythema. Periwound temperature was noted as No Abnormality. The periwound has tenderness on palpation. Wound #13 status is Open. Original cause of wound was Pressure Injury. The wound is located on the Right,Medial Metatarsal head first. The wound measures 0.2cm length x 0.3cm width x 0.1cm depth; 0.047cm^2 area and 0.005cm^3 volume. The wound is limited to skin breakdown. There is a medium amount of serosanguineous drainage noted. The wound margin is flat and intact. There is no granulation within the wound bed. There is a large (67-100%) amount of necrotic tissue within the wound bed including Eschar and Adherent Slough. The periwound skin appearance exhibited: Dry/Scaly. The periwound skin appearance did not exhibit: Callus, Crepitus, Excoriation, Fluctuance, Friable, Induration, Localized Edema, Rash, Scarring, Maceration, Moist, Atrophie Blanche, Cyanosis, Ecchymosis, Hemosiderin Staining, Mottled, Pallor,  Rubor, Erythema. Periwound temperature was noted as No Abnormality. The periwound Goodner, Glayds ([id number]) has tenderness on palpation. Wound #14 status is Open. Original cause of wound was Gradually Appeared. The wound is located on the Right,Posterior Lower Leg. The wound measures 0.7cm length x 0.6cm width x 0.1cm depth; 0.33cm^2 area and 0.033cm^3 volume. The wound is limited to skin breakdown. There is no tunneling or undermining noted. There is a medium amount of serous drainage noted. The wound margin is flat and intact. There  is no granulation within the wound bed. There is a large (67-100%) amount of necrotic tissue within the wound bed including Eschar. The periwound skin appearance exhibited: Moist. The periwound skin appearance did not exhibit: Callus, Crepitus, Excoriation, Fluctuance, Friable, Induration, Localized Edema, Rash, Scarring, Dry/Scaly, Maceration, Atrophie Blanche, Cyanosis, Ecchymosis, Hemosiderin Staining, Mottled, Pallor, Rubor, Erythema. Periwound temperature was noted as No Abnormality. The periwound has tenderness on palpation. 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 Continue with silver alginate over the wounds and a light Kerlix wrap 2. Offloading of the heel and flotation as much as possible. She may use a Sage boat from time to time 3. Adequate protein and good control of her diabetic diet 4. Vitamin K, vitamin C and zinc He is on complex care and will be seen back as needed every couple of weeks. Plan Wound Cleansing: Wound #12 Right,Dorsal Toe Second: Clean wound with Normal Saline. Clean wound with Normal Saline. Wound #13 Right,Medial Metatarsal head first: Clean wound with Normal Saline. [name], [name] ([id number]) Clean wound with Normal Saline. Wound #14 Right,Posterior Lower Leg: Clean wound with Normal Saline. Clean wound with Normal Saline. Anesthetic: Wound #12 Right,Dorsal Toe Second: Topical Lidocaine 4% cream applied to wound bed prior to debridement - for clinic use Wound #13 Right,Medial Metatarsal head first: Topical Lidocaine 4% cream applied to wound bed prior to  debridement - for clinic use Topical Lidocaine 4% cream applied to wound bed prior to debridement - for clinic use Wound #14 Right,Posterior Lower Leg: Topical Lidocaine 4% cream applied to wound bed prior to debridement - for clinic use Topical Lidocaine 4% cream applied to wound bed prior to debridement - for clinic use Primary Wound Dressing: Wound #12 Right,Dorsal Toe Second: Other: - Betadine Wound #13 Right,Medial Metatarsal head first: Other: - Betadine Wound #14 Right,Posterior Lower Leg: Aquacel Ag Secondary Dressing: Wound #14 Right,Posterior Lower Leg: Gauze and Kerlix/Conform Dressing Change Frequency: Wound #14 Right,Posterior Lower Leg: Change dressing every other day. Follow-up Appointments: Wound #12 Right,Dorsal Toe Second: Return Appointment in 1 week. Wound #13 Right,Medial Metatarsal head first: Return Appointment in 1 week. Wound #14 Right,Posterior Lower Leg: Return Appointment in 1 week. Edema Control: Wound #12 Right,Dorsal Toe Second: Elevate legs to the level of the heart and pump ankles as often as possible Wound #13 Right,Medial Metatarsal head first: Elevate legs to the level of the heart and pump ankles as often as possible Wound #14 Right,Posterior Lower Leg: Elevate legs to the level of the heart and pump ankles as often as possible Off-Loading: Wound #12 Right,Dorsal Toe Second: Turn and reposition every 2 hours Other: -  sage boot Wound #13 Right,Medial Metatarsal head first: Turn and reposition every 2 hours Other: - sage boot Wound #14 Right,Posterior Lower Leg: Turn and reposition every 2 hours Other: - sage boot Additional Orders / InstructionsMadisyn [name], [name] ([id number]) Wound #12 Right,Dorsal Toe Second: Increase protein intake. Wound #13 Right,Medial Metatarsal head first: Increase protein intake. Wound #14 Right,Posterior Lower Leg: Increase protein intake. Home Health: Wound #12 Right,Dorsal Toe Second: Continue Home  Health Visits - [facility] Nurse may visit PRN to address patient s wound care needs. FACE TO FACE ENCOUNTER: MEDICARE and MEDICAID PATIENTS: I certify that this patient is under my care and that I had a face-to-face encounter that meets the physician face-to-face encounter requirements with this patient on this date. The encounter with the patient was in whole or in part for the following MEDICAL CONDITION: (primary reason for Home Healthcare) MEDICAL NECESSITY: I certify, that based on my findings, NURSING services are a medically necessary home health service. HOME BOUND STATUS: I certify that my clinical findings support that this patient is homebound (i.e., Due to illness or injury, pt requires aid of supportive devices such as crutches, cane, wheelchairs, walkers, the use of special transportation or the assistance of another person to leave their place of residence. There is a normal inability to leave the home and doing so requires considerable and taxing effort. Other absences are for medical reasons / religious services and are infrequent or of short duration when for other reasons). If current dressing causes regression in wound condition, may D/C ordered dressing product/s and apply Normal Saline Moist Dressing daily until next Wound Healing Center / Other MD appointment. Notify Wound Healing Center of regression in wound condition at [phone]. Please direct any NON-WOUND related issues/requests for orders to patient's Primary Care Physician Wound #13 Right,Medial Metatarsal head first: Continue Home Health Visits - [facility] Nurse may visit PRN to address patient s wound care needs. FACE TO FACE ENCOUNTER: MEDICARE and MEDICAID PATIENTS: I certify that this patient is under my care and that I had a face-to-face encounter that meets the physician face-to-face encounter requirements with this patient on this date. The encounter with the patient was in whole or  in part for the following MEDICAL CONDITION: (primary reason for Home Healthcare) MEDICAL NECESSITY: I certify, that based on my findings, NURSING services are a medically necessary home health service. HOME BOUND STATUS: I certify that my clinical findings support that this patient is homebound (i.e., Due to illness or injury, pt requires aid of supportive devices such as crutches, cane, wheelchairs, walkers, the use of special transportation or the assistance of another person to leave their place of residence. There is a normal inability to leave the home and doing so requires considerable and taxing effort. Other absences are for medical reasons / religious services and are infrequent or of short duration when for other reasons). If current dressing causes regression in wound condition, may D/C ordered dressing product/s and apply Normal Saline Moist Dressing daily until next Wound Healing Center / Other MD appointment. Notify Wound Healing Center of regression in wound condition at [phone]. Please direct any NON-WOUND related issues/requests for orders to patient's Primary Care Physician Wound #14 Right,Posterior Lower Leg: Continue Home Health Visits - [facility] Nurse may visit PRN to address patient s wound care needs. FACE TO FACE ENCOUNTER: MEDICARE and MEDICAID PATIENTS: I certify that this patient is under my care and that I had a face-to-face encounter that meets  the physician face-to-face encounter requirements with this patient on this date. The encounter with the patient was in whole or in part for the following MEDICAL CONDITION: (primary reason for Home Healthcare) MEDICAL NECESSITY: I certify, that based on my findings, NURSING services are a medically necessary home health service. HOME BOUND STATUS: I certify that my clinical findings support that this patient is homebound (i.e., Due to illness or injury, pt requires aid of supportive devices such as crutches,  cane, wheelchairs, walkers, the use [name], [name] ([id number]) of special transportation or the assistance of another person to leave their place of residence. There is a normal inability to leave the home and doing so requires considerable and taxing effort. Other absences are for medical reasons / religious services and are infrequent or of short duration when for other reasons). If current dressing causes regression in wound condition, may D/C ordered dressing product/s and apply Normal Saline Moist Dressing daily until next Wound Healing Center / Other MD appointment. Notify Wound Healing Center of regression in wound condition at [phone]. Please direct any NON-WOUND related issues/requests for orders to patient's Primary Care Physician I have recommended: 1. Continue with silver alginate over the wounds and a light Kerlix wrap 2. Offloading of the heel and flotation as much as possible. She may use a Sage boat from time to time 3. Adequate protein and good control of her diabetic diet 4. Vitamin K, vitamin C and zinc He is on complex care and will be seen back as needed every couple of weeks. Electronic Signature(s) Signed: 03/25/2016 11:40:11 AM By: [name] Kanner MD, FACS Entered By: [name] on 03/25/2016 11:40:10 [name] ([id number]) -------------------------------------------------------------------------------- SuperBill Details Patient Name: [name] Date of Service: 03/25/2016 Medical Record Number: [id number] Patient Account Number: [id number] Date of Birth/Sex: 1921/09/26 (82 y.o. 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 L97.812 Non-pressure chronic ulcer of other part of right lower leg with fat layer exposed Physician Procedures CPT4: Description Modifier Quantity Code [id number] - WC PHYS LEVEL 3 - EST PT 1 ICD-10 Description Diagnosis E11.622 Type 2 diabetes mellitus with other skin ulcer I70.235 Atherosclerosis of native arteries of right leg with ulceration of other part  of foot 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

## 2016-03-26 NOTE — Progress Notes (Signed)
[name], [name] ([id number]) Visit Report for 03/25/2016 Arrival Information Details Patient Name: [name], [name] Date of Service: 03/25/2016 10:00 AM Medical Record Number: [id number] Patient Account Number: [id number] Date of Birth/Sex: 03/29/1921 (82 y.o. Female) Treating RN: [name] Primary Care Physician: [name] Other Clinician: Referring Physician: Dorothey BasemanBRONSTEIN, [name] Treating Physician/Extender: [name] Weeks in Treatment: 1 Visit Information History Since Last Visit Added or deleted any medications: No Patient Arrived: Wheel Chair Any new allergies or adverse reactions: No Arrival Time: 10:02 Had a fall or experienced change in No Accompanied By: daughter in law activities of daily living that may affect Transfer Assistance: Manual risk of falls: Patient Identification Verified: Yes Signs or symptoms of abuse/neglect since last No Secondary Verification Process Yes visito Completed: Hospitalized since last visit: No Patient Requires Transmission- No Has Dressing in Place as Prescribed: Yes Based Precautions: Pain Present Now: No Patient Has Alerts: Yes Patient Alerts: Patient on Blood Thinner DM II Eliquis Electronic Signature(s) Signed: 03/25/2016 3:00:23 PM By: [name] GurneyWoody, RN, BSN, Kim RN, BSN Entered By: [name] on 03/25/2016 10:04:28 [name], [name] ([id number]) -------------------------------------------------------------------------------- Encounter Discharge Information Details Patient Name: [name], [name] Date of Service: 03/25/2016 10:00 AM Medical Record Number: [id number] Patient Account Number: [id number] Date of Birth/Sex: 11/09/1920 (82 y.o. Female) Treating RN: [name] Primary Care Physician: [name] Other Clinician: Referring Physician: Dorothey BasemanBRONSTEIN, [name] Treating Physician/Extender: [name] Weeks in Treatment: 1 Encounter Discharge Information Items Schedule Follow-up Appointment: No Medication  Reconciliation completed No and provided to Patient/Care [name]: Provided on Clinical Summary of Care: 03/25/2016 Form Type Recipient Paper Patient HS Electronic Signature(s) Signed: 03/25/2016 11:04:28 AM By: [name] Entered By: [name] on 03/25/2016 11:04:27 [name], [name] ([id number]) -------------------------------------------------------------------------------- Lower Extremity Assessment Details Patient Name: [name], [name] Date of Service: 03/25/2016 10:00 AM Medical Record Number: [id number] Patient Account Number: [id number] Date of Birth/Sex: 04/27/1921 (82 y.o. Female) Treating RN: [name] Primary Care Physician: [name] Other Clinician: Referring Physician: Dorothey BasemanBRONSTEIN, [name] Treating Physician/Extender: [name] Weeks in Treatment: 1 Edema Assessment Assessed: [Left: No] [Right: No] E[Left: dema] [Right: :] Calf Left: Right: Point of Measurement: cm From Medial Instep cm 33 cm Ankle Left: Right: Point of Measurement: cm From Medial Instep cm 23.5 cm Vascular Assessment Pulses: Posterior Tibial Dorsalis Pedis Palpable: [Right:No] Doppler: [Right:Monophasic] Extremity colors, hair growth, and conditions: Extremity Color: [Right:Normal] Hair Growth on Extremity: [Right:No] Temperature of Extremity: [Right:Warm] Toe Nail Assessment Left: Right: Thick: Yes Discolored: Yes Deformed: No Improper Length and Hygiene: No Notes Pulse faint sounding with dopler Electronic Signature(s) Signed: 03/25/2016 3:00:23 PM By: [name] GurneyWoody, RN, BSN, Kim RN, BSN Entered By: [name] on 03/25/2016 10:16:34 [name], [name] ([id number]) [name], [name] ([id number]) -------------------------------------------------------------------------------- Multi Wound Chart Details Patient Name: [name], [name] Date of Service: 03/25/2016 10:00 AM Medical Record Number: [id number] Patient Account Number: [id number] Date of Birth/Sex: 01/08/1921 (82 y.o.  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 Secondary Etiology: N/A Pressure Ulcer N/A Comorbid History: Arrhythmia, Congestive Arrhythmia, Congestive Arrhythmia, Congestive Heart Failure, Deep Vein Heart Failure, Deep Vein Heart Failure,  Deep Vein Thrombosis, Peripheral Thrombosis, Peripheral Thrombosis, Peripheral Arterial Disease, Type II Arterial Disease, Type II Arterial Disease, Type II Diabetes, Gout, Diabetes, Gout, Diabetes, Gout, Osteoarthritis Osteoarthritis Osteoarthritis Date Acquired: 01/26/2015 01/26/2015 01/26/2015 Weeks of Treatment: Wound Status: Open Open Open Measurements L x W x D 0.5x0.7x0.1 0.2x0.3x0.1 0.7x0.6x0.1 (cm) Area (cm) : 0.275 0.047 0.33 Volume (cm) : 0.027 0.005 0.033 % Reduction in Area: 28.60% 50.00% 99.60% % Reduction in Volume: 28.90% 44.40% 99.60% Classification: Category/Stage II Grade 1 Grade 1 HBO Classification: Grade 1 N/A N/A Dains, [name] ([id number]) Exudate Amount: Medium Medium Medium Exudate Type: Serosanguineous Serosanguineous Serous Exudate Color: red, brown red, brown amber Wound Margin: Flat and Intact Flat and Intact Flat and Intact Granulation Amount: Medium (34-66%) None Present (0%) None Present (0%) Necrotic Amount: Small (1-33%) Large (67-100%) Large (67-100%) Necrotic Tissue: Eschar Eschar, Adherent  Slough Eschar Exposed Structures: Fascia: No Fascia: No Fascia: No Fat: No Fat: No Fat: No Tendon: No Tendon: No Tendon: No Muscle: No Muscle: No Muscle: No Joint: No Joint: No Joint: No Bone: No Bone: No Bone: No Limited to Skin Limited to Skin Limited to Skin Breakdown Breakdown Breakdown Epithelialization: Medium (34-66%) None None Periwound Skin Texture: Edema: No Edema: No Edema: No Excoriation: No Excoriation: No Excoriation: No Induration: No Induration: No Induration: No Callus: No Callus: No Callus: No Crepitus: No Crepitus: No Crepitus: No Fluctuance: No Fluctuance: No Fluctuance: No Friable: No Friable: No Friable: No Rash: No Rash: No Rash: No Scarring: No Scarring: No Scarring: No Periwound Skin Dry/Scaly: Yes Dry/Scaly: Yes Moist: Yes Moisture: Maceration: No Maceration: No Maceration: No Moist: No Moist: No Dry/Scaly: No Periwound Skin Color: Atrophie Blanche: No Atrophie Blanche: No Atrophie Blanche: No Cyanosis: No Cyanosis: No Cyanosis: No Ecchymosis: No Ecchymosis: No Ecchymosis: No Erythema: No Erythema: No Erythema: No Hemosiderin Staining: No Hemosiderin Staining: No Hemosiderin Staining: No Mottled: No Mottled: No Mottled: No Pallor: No Pallor: No Pallor: No Rubor: No Rubor: No Rubor: No Temperature: No Abnormality No Abnormality No Abnormality Tenderness on Yes Yes Yes Palpation: Wound Preparation: Ulcer Cleansing: Ulcer Cleansing: Ulcer Cleansing: Rinsed/Irrigated with Rinsed/Irrigated with Rinsed/Irrigated with Saline Saline Saline Topical Anesthetic Topical Anesthetic Topical Anesthetic Applied: Other: lidocaine Applied: Other: lidocaine Applied: Other: lidocaine 4% 4% 4% Treatment Notes HARSHINI, TRENT ([id number]) Electronic Signature(s) Signed: 03/25/2016 3:00:23 PM By: [name] Gurney, RN, BSN, Kim RN, BSN Entered By: [name] Gurney, RN, BSN, [name] on 03/25/2016 10:51:24 [name]  ([id number]) -------------------------------------------------------------------------------- Multi-Disciplinary Care Plan Details Patient Name: [name] Date of Service: 03/25/2016 10:00 AM Medical Record Number: [id number] Patient Account Number: [id number] Date of Birth/Sex: September 18, 1921 (82 y.o. Female) Treating RN: [name] Coventry Primary Care Physician: [name] Baseman Other Clinician: Referring Physician: Dorothey Baseman Treating Physician/Extender: [name] Re in Treatment: 1 Active Inactive Electronic Signature(s) Signed: 03/25/2016 3:00:23 PM By: [name] Gurney RN, BSN, Kim RN, BSN Entered By: [name] Gurney, RN, BSN, [name] on 03/25/2016 10:51:06 [name] ([id number]) -------------------------------------------------------------------------------- Pain Assessment Details Patient Name: [name] Date of Service: 03/25/2016 10:00 AM Medical Record Number: [id number] Patient Account Number: [id number] Date of Birth/Sex: 12-Feb-1921 (82 y.o. Female) Treating RN: [name] Coventry Primary Care Physician: [name] Baseman Other Clinician: Referring Physician: Dorothey Baseman Treating Physician/Extender: [name] Re in Treatment: 1 Active Problems Location of Pain Severity and Description of Pain Patient Has Paino No Site Locations With Dressing Change: No Pain Management and Medication Current Pain Management: Electronic Signature(s) Signed: 03/25/2016 3:00:23 PM By: [name] Gurney, RN, BSN, Kim RN, BSN Entered By: [name] Gurney, RN, BSN, [name] on 03/25/2016  10:04:37 CALLYN, SEVERTSON ([id number]) -------------------------------------------------------------------------------- Wound Assessment Details Patient Name: JAELIANA, LOCOCO Date of Service: 03/25/2016 10:00 AM Medical Record Number: [id number] Patient Account Number: [id number] Date of Birth/Sex: Feb 01, 1921 (82 y.o. Female) Treating RN: [name] Coventry Primary Care Physician: Terance Hart, [name] Other Clinician: Referring Physician: Dorothey Baseman Treating Physician/Extender: [name] Re in Treatment: 1 Wound Status Wound Number: 12 Primary Pressure Ulcer Etiology: Wound Location: Right Toe Second - Dorsal Wound Open Wounding Event: Pressure Injury Status: Date Acquired: 01/26/2015 Comorbid Arrhythmia, Congestive Heart Failure, Weeks Of Treatment: 1 History: Deep Vein Thrombosis, Peripheral Clustered Wound: No Arterial Disease, Type II Diabetes, Gout, Osteoarthritis Photos Wound Measurements Length: (cm) 0.5 Width: (cm) 0.7 Depth: (cm) 0.1 Area: (cm) 0.275 Volume: (cm) 0.027 % Reduction in Area: 28.6% % Reduction in Volume: 28.9% Epithelialization: Medium (34-66%) Wound Description Classification: Category/Stage II Foul Odor A Diabetic Severity (Wagner): Grade 1 Wound Margin: Flat and Intact Exudate Amount: Medium Exudate Type: Serosanguineous Exudate Color: red, brown fter Cleansing: No Wound Bed Granulation Amount: Medium (34-66%) Exposed Structure Necrotic Amount: Small (1-33%) Fascia Exposed: No Necrotic Quality: Eschar Fat Layer Exposed: No Saetern, Legaci ([id number]) Tendon Exposed: No Muscle Exposed: No Joint Exposed: No Bone Exposed: No Limited to Skin Breakdown Periwound Skin Texture Texture Color No Abnormalities Noted: No No Abnormalities Noted: No Callus: No Atrophie Blanche: No Crepitus: No Cyanosis: No Excoriation: No Ecchymosis: No Fluctuance: No Erythema: No Friable: No Hemosiderin Staining: No Induration: No Mottled: No Localized Edema: No Pallor: No Rash: No Rubor: No Scarring: No Temperature / Pain Moisture Temperature: No Abnormality No Abnormalities Noted: No Tenderness on Palpation: Yes Dry / Scaly: Yes Maceration: No Moist: No Wound Preparation Ulcer Cleansing: Rinsed/Irrigated with Saline Topical Anesthetic Applied: Other: lidocaine 4%, Treatment Notes Wound #12 (Right, Dorsal Toe Second) 1. Cleansed with: Clean wound with Normal Saline 2.  Anesthetic Topical Lidocaine 4% cream to wound bed prior to debridement 4. Dressing Applied: Aquacel Ag 5. Secondary Dressing Applied Gauze and Kerlix/Conform 7. Secured with Tape Notes Toe and foot paint with betadine; Electronic Signature(s) Signed: 03/25/2016 3:00:23 PM By: [name] Gurney, RN, BSN, Kim RN, BSN Entered By: [name] Gurney, RN, BSN, [name] on 03/25/2016 10:18:04 [name] ([id number]) [name] [place], Eyvonne Left ([id number]) -------------------------------------------------------------------------------- Wound Assessment Details Patient Name: [name] Date of Service: 03/25/2016 10:00 AM Medical Record Number: [id number] Patient Account Number: [id number] Date of Birth/Sex: 01/27/1921 (82 y.o. Female) Treating RN: [name] Coventry Primary Care Physician: Terance Hart, [name] Other Clinician: Referring Physician: Dorothey Baseman Treating Physician/Extender: [name] Re in Treatment: 1 Wound Status Wound Number: 13 Primary Diabetic Wound/Ulcer of the Lower Etiology: Extremity Wound Location: Right Metatarsal head first - Medial Secondary Pressure Ulcer Etiology: Wounding Event: Pressure Injury Wound Open Date Acquired: 01/26/2015 Status: Weeks Of Treatment: 1 Comorbid Arrhythmia, Congestive Heart Failure, Clustered Wound: No History: Deep Vein Thrombosis, Peripheral Arterial Disease, Type II Diabetes, Gout, Osteoarthritis Photos Wound Measurements Length: (cm) 0.2 Width: (cm) 0.3 Depth: (cm) 0.1 Area: (cm) 0.047 Volume: (cm) 0.005 % Reduction in Area: 50% % Reduction in Volume: 44.4% Epithelialization: None Wound Description Classification: Grade 1 Wound Margin: Flat and Intact Exudate Amount: Medium Exudate Type: Serosanguineous Exudate Color: red, brown Foul Odor After Cleansing: No Wound Bed Granulation Amount: None Present (0%) Exposed Structure Necrotic Amount: Large (67-100%) Fascia Exposed: No Ager, Aluna ([id number]) Necrotic Quality: Eschar, Adherent  Slough Fat Layer Exposed: No Tendon Exposed: No Muscle Exposed: No Joint Exposed: No Bone Exposed: No Limited to Skin Breakdown Periwound Skin Texture Texture Color No Abnormalities Noted: No No Abnormalities Noted: No  Callus: No Atrophie Blanche: No Crepitus: No Cyanosis: No Excoriation: No Ecchymosis: No Fluctuance: No Erythema: No Friable: No Hemosiderin Staining: No Induration: No Mottled: No Localized Edema: No Pallor: No Rash: No Rubor: No Scarring: No Temperature / Pain Moisture Temperature: No Abnormality No Abnormalities Noted: No Tenderness on Palpation: Yes Dry / Scaly: Yes Maceration: No Moist: No Wound Preparation Ulcer Cleansing: Rinsed/Irrigated with Saline Topical Anesthetic Applied: Other: lidocaine 4%, Treatment Notes Wound #13 (Right, Medial Metatarsal head first) 1. Cleansed with: Clean wound with Normal Saline 2. Anesthetic Topical Lidocaine 4% cream to wound bed prior to debridement 4. Dressing Applied: Aquacel Ag 5. Secondary Dressing Applied Gauze and Kerlix/Conform 7. Secured with Tape Notes Toe and foot paint with betadine; Electronic Signature(s) Signed: 03/25/2016 3:00:23 PM By: [name] Gurney, RN, BSN, Kim RN, BSN [place], Viann ([id number]) Entered By: [name] Gurney, RN, BSN, [name] on 03/25/2016 10:18:52 [name] ([id number]) -------------------------------------------------------------------------------- Wound Assessment Details Patient Name: [name] Date of Service: 03/25/2016 10:00 AM Medical Record Number: [id number] Patient Account Number: [id number] Date of Birth/Sex: Jun 30, 1921 (82 y.o. Female) Treating RN: [name] Coventry Primary Care Physician: Terance Hart, [name] Other Clinician: Referring Physician: Dorothey Baseman Treating Physician/Extender: [name] Re in Treatment: 1 Wound Status Wound Number: 14 Primary Diabetic Wound/Ulcer of the Lower Etiology: Extremity Wound Location: Right Lower Leg - Posterior Wound  Open Wounding Event: Gradually Appeared Status: Date Acquired: 01/26/2015 Comorbid Arrhythmia, Congestive Heart Failure, Weeks Of Treatment: 1 History: Deep Vein Thrombosis, Peripheral Clustered Wound: No Arterial Disease, Type II Diabetes, Gout, Osteoarthritis Photos Wound Measurements Length: (cm) 0.7 Width: (cm) 0.6 Depth: (cm) 0.1 Area: (cm) 0.33 Volume: (cm) 0.033 % Reduction in Area: 99.6% % Reduction in Volume: 99.6% Epithelialization: None Tunneling: No Undermining: No Wound Description Classification: Grade 1 Foul Odor Aft Wound Margin: Flat and Intact Exudate Amount: Medium Exudate Type: Serous Exudate Color: amber er Cleansing: No Wound Bed Granulation Amount: None Present (0%) Exposed Structure Necrotic Amount: Large (67-100%) Fascia Exposed: No Necrotic Quality: Eschar Fat Layer Exposed: No Tendon Exposed: No Brahm, Dellanira ([id number]) Muscle Exposed: No Joint Exposed: No Bone Exposed: No Limited to Skin Breakdown Periwound Skin Texture Texture Color No Abnormalities Noted: No No Abnormalities Noted: No Callus: No Atrophie Blanche: No Crepitus: No Cyanosis: No Excoriation: No Ecchymosis: No Fluctuance: No Erythema: No Friable: No Hemosiderin Staining: No Induration: No Mottled: No Localized Edema: No Pallor: No Rash: No Rubor: No Scarring: No Temperature / Pain Moisture Temperature: No Abnormality No Abnormalities Noted: No Tenderness on Palpation: Yes Dry / Scaly: No Maceration: No Moist: Yes Wound Preparation Ulcer Cleansing: Rinsed/Irrigated with Saline Topical Anesthetic Applied: Other: lidocaine 4%, Treatment Notes Wound #14 (Right, Posterior Lower Leg) 1. Cleansed with: Clean wound with Normal Saline 2. Anesthetic Topical Lidocaine 4% cream to wound bed prior to debridement 4. Dressing Applied: Aquacel Ag 5. Secondary Dressing Applied Gauze and Kerlix/Conform 7. Secured with Tape Notes Toe and foot paint with  betadine; Electronic Signature(s) Signed: 03/25/2016 3:00:23 PM By: [name] Gurney, RN, BSN, Kim RN, BSN Entered By: [name] Gurney, RN, BSN, [name] on 03/25/2016 10:19:56 [name] ([id number]) [name] [place], Eyvonne Left ([id number]) -------------------------------------------------------------------------------- Vitals Details Patient Name: [name] Date of Service: 03/25/2016 10:00 AM Medical Record Number: [id number] Patient Account Number: [id number] Date of Birth/Sex: 1921-07-17 (82 y.o. Female) Treating RN: [name] Coventry Primary Care Physician: [name] Baseman Other Clinician: Referring Physician: Dorothey Baseman Treating Physician/Extender: [name] Re in Treatment: 1 Vital Signs Time Taken: 10:04 Temperature (F): 97.8 Pulse (bpm): 55 Respiratory Rate (breaths/min): 18 Blood Pressure (mmHg): 135/64 Reference Range: 80 -  120 mg / dl Electronic Signature(s) Signed: 03/25/2016 3:00:23 PM By: [name] GurneyWoody, RN, BSN, Kim RN, BSN Entered By: [name] on 03/25/2016 10:04:57

## 2016-04-15 ENCOUNTER — Encounter: Payer: Medicare Other | Attending: Surgery | Admitting: Surgery

## 2016-04-15 DIAGNOSIS — Z89512 Acquired absence of left leg below knee: Secondary | ICD-10-CM | POA: Insufficient documentation

## 2016-04-15 DIAGNOSIS — L97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: Secondary | ICD-10-CM | POA: Insufficient documentation

## 2016-04-15 DIAGNOSIS — E11622 Type 2 diabetes mellitus with other skin ulcer: Secondary | ICD-10-CM | POA: Insufficient documentation

## 2016-04-15 DIAGNOSIS — L97812 Non-pressure chronic ulcer of other part of right lower leg with fat layer exposed: Secondary | ICD-10-CM | POA: Insufficient documentation

## 2016-04-15 DIAGNOSIS — I70235 Atherosclerosis of native arteries of right leg with ulceration of other part of foot: Secondary | ICD-10-CM | POA: Diagnosis not present

## 2016-04-16 NOTE — Progress Notes (Addendum)
SULLY, MANZI ([id number]) Visit Report for 04/15/2016 Chief Complaint Document Details Patient Name: [name], [name] Date of Service: 04/15/2016 10:00 AM Medical Record Number: [id number] Patient Account Number: [id number] Date of Birth/Sex: 07/20/1921 (82 y.o. Female) Treating RN: Huel Coventry Primary Care Physician: Dorothey Baseman Other Clinician: Referring Physician: Dorothey Baseman Treating Physician/Extender: Rudene Re in Treatment: 4 Information Obtained from: Patient Chief Complaint 82 year old patient who is very well-known to as returns after a recent admission to hospital and his prolonged stay in the nursing home. Electronic Signature(s) Signed: 04/15/2016 11:48:12 AM By: Evlyn Kanner MD, FACS Entered By: Evlyn Kanner on 04/15/2016 11:48:12 [name] ([id number]) -------------------------------------------------------------------------------- HPI Details Patient Name: [name] Date of Service: 04/15/2016 10:00 AM Medical Record Number: [id number] Patient Account Number: [id number] Date of Birth/Sex: 13-Sep-1921 (82 y.o. Female) Treating RN: Huel Coventry Primary Care Physician: Dorothey Baseman Other Clinician: Referring Physician: Dorothey Baseman Treating Physician/Extender: Rudene Re in Treatment: 4 History of Present Illness Location: right medial heel and right second toe Quality: Patient reports experiencing a dull pain to affected area(s). Severity: Patient states wound are getting worse. Duration: Patient has had the wound for > 6months prior to seeking treatment at the wound center Timing: Pain in wound is Intermittent (comes and goes Context: The wound appeared gradually over time Modifying Factors: Consults to this date include:vascular surgeon and several recent surgeries. HPI Description: A pleasant 82 year old patient who is very well-known to my practice was seen about a month and a half ago until she was admitted to hospital  and is known to have diabetes mellitus for several years has recently gone through a series of operations with the vascular surgeon Dr. Gilda Crease. In January and February she had several surgeries on the left lower extremity with an attempt to have limb salvage for gangrenous changes of her forefoot. 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 On 01/07/2015 she also had a right lower extremity distal runoff with a angioplasty of the right anterior tibial artery to maximize her blood flow to the foot. She recently had her staples removed at Dr. Marijean Heath office on 01/31/2015 and at that time a right lower extremity duplex was done which showed patent vessels and a patent stent with distal occluded posterior tibial artery. Though the duplex was noncritical the recommendations from the PA at the vascular surgery office was that of a angiogram to be done but the patient said she would rather try some bone care before. The patient has received doxycycline and Cipro in the recent past and she takes oral medications for her diabetes. Other than that I reviewed her list of all her medications. No recent hemoglobin A1c has been done and no recent x-rays of the right foot have been done. 02/18/2015 -- x-ray of the right foot was done on 02/11/2015 and it shows no evidence of acute osteomyelitis of the third toe or of the calcaneus. She had gone to the vascular surgery office and they had noted that there is dehiscence of the left part of the amputation site of the below-knee wound and they have asked [place] to kindly take over the care of this. There've been doing dressings for the right heel and right third toe. 02/25/2015 -- we have received notes from the vascular group who saw her last on 02/13/2015 and she was seen by the PA Ms. Cleda Daub. She had recommended that the patient continue to follow with [place] for wound  care  including management of the left BKA stump, which has had dehiscence of the lateral part. They will consider repeating a arterial duplex or angiogram if the right lower extremity does not heal within a reasonable period of time. 03/18/2015 -- he saw the vascular surgeon Dr. Gilda Crease and he has set her up for an angioplasty sometime in the middle of July. she is doing well otherwise. 04/10/2015 -- the patient had a procedure done on 04/08/2015 and this was a right angioplasty of the [place], [place] ([id number]) dorsalis pedis, anterior tibial artery, first superficial femoral artery in its midportion. There was successful intervention with recanalization and in-line flow to the right foot. 04/22/2015 -- the patient was looking rather pale today and the daughter confirms that her hemoglobin was down to 6.9. she is being monitored by her PCP. Her Lasix dose was increased and her edema has gone down significantly. 04/29/2015 -- she had vascular studies done and the ABI on the right is 1.3 and the toe pressures were within normal limits. Her hemoglobin is still around 7 and she refuses to take any blood transfusions for religious reasons. Her potassium was normal. 06/10/2015 -- she has a new blister on her right lateral and posterior part of her leg just in the region where the Kerlix bandages were applied and this may be due to an abrasion. 06/24/2015 -- On her right lower extremity she has developed several more blisters which look like small pustules and the drain informed shallow ulcerations. 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 07/01/2015 -- she has an appointment with the PCP this Friday and her vascular surgeon on Monday. Other than that she is on doxycycline and has finished 1 week of treatment. The pustules she had all over have now resolved. 07/08/2015 -- she saw the vascular surgeon who did studies in the office and found that there is poor circulation in the distal lower right leg and has  set her up for an angiogram and possible stenting next Tuesday. 07/22/2015 -- on October 18 she was taken up for a successful revascularization of the anterior tibial vessel on the right side. a PTA and stent placement was done to the right anterior tibial artery. 08/19/2015 -- she has broken out with some linear ulceration in the region of her webspaces of her toes and this is something new. 08/26/2015 -- over the last 3 days there was a streak of redness going from her lower extremity towards her toes but she had no fever or discharge from the wounds. 09/02/2015 -- the redness and cellulitis has gone down but she continues to have a lot of edema of the right lower extremity. 10/28/2015 -- over the last week she has been draining a lot of fluid from her right lower extremities and several of the wounds which had healed in the past and now opened up again 11/06/2015 -- she was seen in the vascular office and the right ABI was more than 1.3 which was consistent with noncompressible arteries due to medial calcification and the right great toe pressure was 0.24 and the PPG waveform showed significant decrease. from talking to the family member I believe no procedure has been planned. She was also seen by her PCP was increased her Lasix to 80 mg a day and has ordered some lab work but these reports are not back. 11/20/2015 -- Recent labs done hemoglobin A1c was 6.2, INR was .93, iron-binding capacity was 310, BMP was within normal limits except for the BUN which  was 40 and albumin was 3.1. Her hemoglobin is up to 10.7 with a hematocrit of 34.2 he was advised lymphedema pumps by Dr. Gilda Crease and she has not been wearing these. Her daughter-in- law also says that she has been very noncompliant about elevation of her limbs. 12/18/2015 -- she was seen by Dr. Gilda Crease who agreed with the management and did not think she required any intervention. He discussed elevation and use of lymph pump and will see  her back as required. 01/15/2016 -- we have been seeing help me every other week and from what I understand from the family member she is quite stubborn and noncompliant and does not use any elevation of follow the advice saying [place], [place] ([id number]) most medications and applications are causing her pain. 03/12/2016 -- was admitted to the hospital on 02/03/2016 with a right lower extremity cellulitis. She was put on vancomycin and Azactam and metronidazole. She was seen by Dr. Wyn Quaker who performed a aortogram and selective right lower extremity angiogram with a angioplasty of the proximal right anterior tibial artery and right posterior tibial artery and peroneal artery. She was also seen by Dr. Sampson Goon of infectious disease who recommended changing over to ciprofloxacin 500 mg by mouth twice a day for 7 more days and doxycycline 100 mg by mouth twice a day for 7 more days. She had 9 days of IV antibiotics including vancomycin, aztreonam and Flagyl and the wound was improving. He recommended that she follow-up with me at the wound center and if her leg was worsening repeat cultures could be done and he would make appropriate changes to the antibiotics. She was discharged home on 02/13/2016 with oral ciprofloxacin and doxycycline. She was seen in follow-up on 02/27/2016 at the vascular office for a postop visit status post right lower extremity angiogram and the postprocedure course was found to be uneventful. He was told to come back in a month for an ABI and right lower extremity duplex study to assess her PAD. of note recent x-rays done on April 27 and May 17 did not show any osteomyelitis of the second toe x-ray of the right foot -- IMPRESSION:Findings compatible with cellulitis of the second toe. No objective evidence of osteomyelitis but there is severe diffuse osteopenia which limits sensitivity of the study. 04/15/2016 -- agent has now got dependent edema and has been oozing fluid  from the lower extremity and from the right foot. He is rather stubborn about not elevating her leg. Electronic Signature(s) Signed: 04/15/2016 11:49:11 AM By: Evlyn Kanner MD, FACS Entered By: Evlyn Kanner on 04/15/2016 11:49:11 [name] ([id number]) -------------------------------------------------------------------------------- Physical Exam Details Patient Name: [name] Date of Service: 04/15/2016 10:00 AM Medical Record Number: [id number] Patient Account Number: [id number] Date of Birth/Sex: 12-29-1920 (82 y.o. Female) Treating RN: Huel Coventry Primary Care Physician: Dorothey Baseman Other Clinician: Referring Physician: Dorothey Baseman Treating Physician/Extender: Rudene Re in Treatment: 4 Constitutional . Pulse regular. Respirations normal and unlabored. Afebrile. . Eyes Nonicteric. Reactive to light. Ears, Nose, Mouth, and Throat Lips, teeth, and gums WNL.Marland Kitchen Moist mucosa without lesions. Neck supple and nontender. No palpable supraclavicular or cervical adenopathy. Normal sized without goiter. Respiratory WNL. No retractions.. Cardiovascular Pedal Pulses WNL. significant lymphedema and limb for your of the right lower extremity and foot. Chest Breasts symmetical and no nipple discharge.. Breast tissue WNL, no masses, lumps, or tenderness.. Lymphatic No adneopathy. No adenopathy. No adenopathy. Musculoskeletal Adexa without tenderness or enlargement.. Digits and nails w/o clubbing, cyanosis, infection, petechiae, ischemia, or  inflammatory conditions.. Integumentary (Hair, Skin) No suspicious lesions. No crepitus or fluctuance. No peri-wound warmth or erythema. No masses.Marland Kitchen Psychiatric Judgement and insight Intact.. No evidence of depression, anxiety, or agitation.. Notes the right lower extremity has now got dependent edema and there is evidence of lymph fluid draining. Also edema of the foot. The right lower extremity does not have cellulitis but  there are some open ulcerations. Electronic Signature(s) Signed: 04/15/2016 11:50:26 AM By: Evlyn Kanner MD, FACS Entered By: Evlyn Kanner on 04/15/2016 11:50:26 [name] ([id number]) -------------------------------------------------------------------------------- Physician Orders Details Patient Name: [name] Date of Service: 04/15/2016 10:00 AM Medical Record Number: [id number] Patient Account Number: [id number] Date of Birth/Sex: 1921-08-21 (82 y.o. Female) Treating RN: Huel Coventry Primary Care Physician: Dorothey Baseman Other Clinician: Referring Physician: Dorothey Baseman Treating Physician/Extender: Rudene Re in Treatment: 4 Verbal / Phone Orders: Yes Clinician: Huel Coventry Read Back and Verified: Yes Diagnosis Coding Wound Cleansing Wound #12 Right,Dorsal Toe Second o Clean wound with Normal Saline. o Cleanse wound with mild soap and water o May Shower, gently pat wound dry prior to applying new dressing. Wound #15 Right,Dorsal Foot o Clean wound with Normal Saline. o Cleanse wound with mild soap and water o May Shower, gently pat wound dry prior to applying new dressing. Primary Wound Dressing Wound #12 Right,Dorsal Toe Second o Calcium Alginate with Silver - between toes Wound #15 Right,Dorsal Foot o Calcium Alginate with Silver - between toes Secondary Dressing Wound #12 Right,Dorsal Toe Second o ABD pad Wound #15 Right,Dorsal Foot o ABD pad Dressing Change Frequency Wound #12 Right,Dorsal Toe Second o Change dressing every other day. - or as needed for soiled or drainage Wound #15 Right,Dorsal Foot o Change dressing every other day. - or as needed for soiled or drainage Follow-up Appointments Wound #12 Right,Dorsal Toe Second o Return Appointment in 2 weeks. [place], [place] ([id number]) Wound #15 Right,Dorsal Foot o Return Appointment in 2 weeks. Edema Control Wound #12 Right,Dorsal Toe Second o Other: -  kerlix, coban from base of toes to three finger width below knee Wound #15 Right,Dorsal Foot o Other: - kerlix, coban from base of toes to three finger width below knee Electronic Signature(s) Signed: 04/15/2016 3:15:21 PM By: Riki Sheer Signed: 04/15/2016 4:28:14 PM By: Evlyn Kanner MD, FACS Previous Signature: 04/15/2016 11:47:22 AM Version By: Riki Sheer Entered By: Riki Sheer on 04/15/2016 13:29:25 [name] ([id number]) -------------------------------------------------------------------------------- Problem List Details Patient Name: [name] Date of Service: 04/15/2016 10:00 AM Medical Record Number: [id number] Patient Account Number: [id number] Date of Birth/Sex: May 11, 1921 (82 y.o. 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/12/2016 Yes fat layer exposed L97.812 Non-pressure chronic ulcer of other part of right lower leg 03/12/2016 Yes with fat layer exposed Inactive Problems Resolved Problems Electronic Signature(s) Signed: 04/15/2016 11:48:03 AM By: Evlyn Kanner MD, FACS Entered By: Evlyn Kanner on 04/15/2016 11:48:03 [name] ([id number]) -------------------------------------------------------------------------------- Progress Note Details Patient Name: [name] Date of Service: 04/15/2016 10:00 AM Medical Record Number: [id number] Patient Account Number: [id number] Date of Birth/Sex: 26-Aug-1921 (82 y.o.  Female) Treating RN: Huel Coventry  Primary Care Physician: Terance Hart, DAVID Other Clinician: Referring Physician: Dorothey Baseman Treating Physician/Extender: Rudene Re in Treatment: 4 Subjective Chief Complaint Information obtained from Patient 82 year old patient who is very well-known to as returns after a recent admission to hospital and his prolonged stay in the nursing home. History of Present Illness (HPI) The following HPI elements were documented for the patient's wound: Location: right medial heel and right second toe Quality: Patient reports experiencing a dull pain to affected area(s). Severity: Patient states wound are getting worse. Duration: Patient has had the wound for > 6months prior to seeking treatment at the wound center Timing: Pain in wound is Intermittent (comes and goes Context: The wound appeared gradually over time Modifying Factors: Consults to this date include:vascular surgeon and several recent surgeries. A pleasant 82 year old patient who is very well-known to my practice was seen about a month and a half ago until she was admitted to hospital and is known to have diabetes mellitus for several years has recently gone through a series of operations with the vascular surgeon Dr. Gilda Crease. In January and February she had several surgeries on the left lower extremity with an attempt to have limb salvage for gangrenous changes of her forefoot. 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 On 01/07/2015 she also had a right lower extremity distal runoff with a angioplasty of the right anterior tibial artery to maximize her blood flow to the foot. She recently had her staples removed at Dr. Marijean Heath office on 01/31/2015 and at that time a right lower extremity duplex was done which showed patent vessels and a patent stent with distal occluded posterior tibial artery. Though the  duplex was noncritical the recommendations from the PA at the vascular surgery office was that of a angiogram to be done but the patient said she would rather try some bone care before. The patient has received doxycycline and Cipro in the recent past and she takes oral medications for her diabetes. Other than that I reviewed her list of all her medications. No recent hemoglobin A1c has been done and no recent x-rays of the right foot have been done. 02/18/2015 -- x-ray of the right foot was done on 02/11/2015 and it shows no evidence of acute osteomyelitis of the third toe or of the calcaneus. She had gone to the vascular surgery office and they had noted that there is dehiscence of the left part of the amputation site of the below-knee wound and they have asked [place] to kindly take over the care of this. There've been doing dressings for the right heel and right third toe. 02/25/2015 -- we have received notes from the vascular group who saw her last on 02/13/2015 and she [place], [place] ([id number]) was seen by the PA Ms. Cleda Daub. She had recommended that the patient continue to follow with [place] for wound care including management of the left BKA stump, which has had dehiscence of the lateral part. They will consider repeating a arterial duplex or angiogram if the right lower extremity does not heal within a reasonable period of time. 03/18/2015 -- he saw the vascular surgeon Dr. Gilda Crease and he has set her up for an angioplasty sometime in the middle of July. she is doing well otherwise. 04/10/2015 -- the patient had a procedure done on 04/08/2015 and this was a right angioplasty of the dorsalis pedis, anterior tibial artery, first superficial femoral artery in its midportion. There  was successful intervention with recanalization and in-line flow to the right foot. 04/22/2015 -- the patient was looking rather pale today and the daughter confirms that her hemoglobin was down to 6.9. she  is being monitored by her PCP. Her Lasix dose was increased and her edema has gone down significantly. 04/29/2015 -- she had vascular studies done and the ABI on the right is 1.3 and the toe pressures were within normal limits. Her hemoglobin is still around 7 and she refuses to take any blood transfusions for religious reasons. Her potassium was normal. 06/10/2015 -- she has a new blister on her right lateral and posterior part of her leg just in the region where the Kerlix bandages were applied and this may be due to an abrasion. 06/24/2015 -- On her right lower extremity she has developed several more blisters which look like small pustules and the drain informed shallow ulcerations. 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 07/01/2015 -- she has an appointment with the PCP this Friday and her vascular surgeon on Monday. Other than that she is on doxycycline and has finished 1 week of treatment. The pustules she had all over have now resolved. 07/08/2015 -- she saw the vascular surgeon who did studies in the office and found that there is poor circulation in the distal lower right leg and has set her up for an angiogram and possible stenting next Tuesday. 07/22/2015 -- on October 18 she was taken up for a successful revascularization of the anterior tibial vessel on the right side. a PTA and stent placement was done to the right anterior tibial artery. 08/19/2015 -- she has broken out with some linear ulceration in the region of her webspaces of her toes and this is something new. 08/26/2015 -- over the last 3 days there was a streak of redness going from her lower extremity towards her toes but she had no fever or discharge from the wounds. 09/02/2015 -- the redness and cellulitis has gone down but she continues to have a lot of edema of the right lower extremity. 10/28/2015 -- over the last week she has been draining a lot of fluid from her right lower extremities and several of the wounds  which had healed in the past and now opened up again 11/06/2015 -- she was seen in the vascular office and the right ABI was more than 1.3 which was consistent with noncompressible arteries due to medial calcification and the right great toe pressure was 0.24 and the PPG waveform showed significant decrease. from talking to the family member I believe no procedure has been planned. She was also seen by her PCP was increased her Lasix to 80 mg a day and has ordered some lab work but these reports are not back. 11/20/2015 -- Recent labs done hemoglobin A1c was 6.2, INR was .93, iron-binding capacity was 310, BMP was within normal limits except for the BUN which was 40 and albumin was 3.1. Her hemoglobin is up to [facility], [place] ([id number]) 10.7 with a hematocrit of 34.2 he was advised lymphedema pumps by Dr. Gilda Crease and she has not been wearing these. Her daughter-in- law also says that she has been very noncompliant about elevation of her limbs. 12/18/2015 -- she was seen by Dr. Gilda Crease who agreed with the management and did not think she required any intervention. He discussed elevation and use of lymph pump and will see her back as required. 01/15/2016 -- we have been seeing help me every other week and from what I  understand from the family member she is quite stubborn and noncompliant and does not use any elevation of follow the advice saying most medications and applications are causing her pain. 03/12/2016 -- was admitted to the hospital on 02/03/2016 with a right lower extremity cellulitis. She was put on vancomycin and Azactam and metronidazole. She was seen by Dr. Wyn Quaker who performed a aortogram and selective right lower extremity angiogram with a angioplasty of the proximal right anterior tibial artery and right posterior tibial artery and peroneal artery. She was also seen by Dr. Sampson Goon of infectious disease who recommended changing over to ciprofloxacin 500 mg by mouth twice a day  for 7 more days and doxycycline 100 mg by mouth twice a day for 7 more days. She had 9 days of IV antibiotics including vancomycin, aztreonam and Flagyl and the wound was improving. He recommended that she follow-up with me at the wound center and if her leg was worsening repeat cultures could be done and he would make appropriate changes to the antibiotics. She was discharged home on 02/13/2016 with oral ciprofloxacin and doxycycline. She was seen in follow-up on 02/27/2016 at the vascular office for a postop visit status post right lower extremity angiogram and the postprocedure course was found to be uneventful. He was told to come back in a month for an ABI and right lower extremity duplex study to assess her PAD. of note recent x-rays done on April 27 and May 17 did not show any osteomyelitis of the second toe x-ray of the right foot -- IMPRESSION:Findings compatible with cellulitis of the second toe. No objective evidence of osteomyelitis but there is severe diffuse osteopenia which limits sensitivity of the study. 04/15/2016 -- agent has now got dependent edema and has been oozing fluid from the lower extremity and from the right foot. He is rather stubborn about not elevating her leg. Objective Constitutional Pulse regular. Respirations normal and unlabored. Afebrile. Vitals Time Taken: 10:12 AM, Temperature: 97.84 F, Pulse: 84 bpm, Respiratory Rate: 20 breaths/min, Blood Pressure: 124/111 mmHg. Eyes Nonicteric. Reactive to light. Ears, Nose, Mouth, and Throat Lips, teeth, and gums WNL.Marland Kitchen Moist mucosa without lesions. Vanwyhe, Destany ([id number]) Neck supple and nontender. No palpable supraclavicular or cervical adenopathy. Normal sized without goiter. Respiratory WNL. No retractions.. Cardiovascular Pedal Pulses WNL. significant lymphedema and limb for your of the right lower extremity and foot. Chest Breasts symmetical and no nipple discharge.. Breast tissue WNL, no masses,  lumps, or tenderness.. Lymphatic No adneopathy. No adenopathy. No adenopathy. Musculoskeletal Adexa without tenderness or enlargement.. Digits and nails w/o clubbing, cyanosis, infection, petechiae, ischemia, or inflammatory conditions.Marland Kitchen Psychiatric Judgement and insight Intact.. No evidence of depression, anxiety, or agitation.. General Notes: the right lower extremity has now got dependent edema and there is evidence of lymph fluid draining. Also edema of the foot. The right lower extremity does not have cellulitis but there are some open ulcerations. Integumentary (Hair, Skin) No suspicious lesions. No crepitus or fluctuance. No peri-wound warmth or erythema. No masses.. Wound #12 status is Open. Original cause of wound was Pressure Injury. The wound is located on the Right,Dorsal Toe Second. The wound measures 0.3cm length x 0.5cm width x 0.1cm depth; 0.118cm^2 area and 0.012cm^3 volume. The wound is limited to skin breakdown. There is no tunneling noted. There is a medium amount of serosanguineous drainage noted. The wound margin is flat and intact. There is medium (34-66%) pink granulation within the wound bed. There is no necrotic tissue within the wound bed. The  periwound skin appearance exhibited: Localized Edema, Dry/Scaly, Maceration, Moist, Hemosiderin Staining. The periwound skin appearance did not exhibit: Callus, Crepitus, Excoriation, Fluctuance, Friable, Induration, Rash, Scarring, Atrophie Blanche, Cyanosis, Ecchymosis, Mottled, Pallor, Rubor, Erythema. Periwound temperature was noted as No Abnormality. The periwound has tenderness on palpation. Wound #13 status is Healed - Epithelialized. Original cause of wound was Pressure Injury. The wound is located on the Right,Medial Metatarsal head first. The wound measures 0cm length x 0cm width x 0cm depth; 0cm^2 area and 0cm^3 volume. The wound is limited to skin breakdown. There is a medium amount of serosanguineous drainage  noted. The wound margin is flat and intact. There is no granulation within the wound bed. There is a large (67-100%) amount of necrotic tissue within the wound bed including Eschar and Adherent Slough. The periwound skin appearance exhibited: Dry/Scaly. The periwound skin appearance did not exhibit: Callus, Crepitus, Excoriation, Fluctuance, Friable, Induration, Localized Edema, Rash, Scarring, Maceration, Moist, Atrophie Blanche, Cyanosis, Ecchymosis, Hemosiderin Staining, Mottled, Pallor, Rubor, Erythema. Periwound temperature was noted as No Abnormality. The periwound has tenderness on palpation. [name], Ismahan ([id number]) Wound #14 status is Healed - Epithelialized. Original cause of wound was Gradually Appeared. The wound is located on the Right,Posterior Lower Leg. The wound measures 0cm length x 0cm width x 0cm depth; 0cm^2 area and 0cm^3 volume. The wound is limited to skin breakdown. There is a medium amount of serous drainage noted. The wound margin is flat and intact. There is no granulation within the wound bed. There is a large (67-100%) amount of necrotic tissue within the wound bed including Eschar. The periwound skin appearance exhibited: Moist. The periwound skin appearance did not exhibit: Callus, Crepitus, Excoriation, Fluctuance, Friable, Induration, Localized Edema, Rash, Scarring, Dry/Scaly, Maceration, Atrophie Blanche, Cyanosis, Ecchymosis, Hemosiderin Staining, Mottled, Pallor, Rubor, Erythema. Periwound temperature was noted as No Abnormality. The periwound has tenderness on palpation. Wound #15 status is Open. Original cause of wound was Gradually Appeared. The wound is located on the Right,Dorsal Foot. The wound measures 13cm length x 12cm width x 0.1cm depth; 122.522cm^2 area and 12.252cm^3 volume. The wound is limited to skin breakdown. There is no tunneling or undermining noted. There is a medium amount of serosanguineous drainage noted. The wound margin is flat  and intact. There is small (1-33%) pink granulation within the wound bed. There is no necrotic tissue within the wound bed. The periwound skin appearance had no abnormalities noted for texture. The periwound skin appearance exhibited: Hemosiderin Staining, Erythema. The periwound skin appearance did not exhibit: Dry/Scaly, Maceration, Moist, Atrophie Blanche, Cyanosis, Ecchymosis, Mottled, Pallor, Rubor. The surrounding wound skin color is noted with erythema which is circumferential. Other Condition(s) Patient presents with Other Dermatologic Condition located on the Right Foot. The skin appearance exhibited: Hemosiderin Staining, Localized Edema, Maceration, Moist. The skin appearance did not exhibit: Atrophie Blanche, Callus, Crepitus, Cyanosis, Dry/Scaly, Ecchymosis, Erythema, Excoriation, Fluctuance, Friable, Induration, Mottled, Pallor, Rash, Rubor, Scarring. Skin temperature was noted as No Abnormality. 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 Cleanse wound with mild soap and water May  Shower, gently pat wound dry prior to applying new dressing. Wound #15 Right,Dorsal Foot: Clean wound with Normal Saline. Cleanse wound with mild soap and water May Shower, gently pat wound dry prior to applying new dressing. Primary Wound Dressing: Wound #12 Right,Dorsal Toe Second: Calcium Alginate with Silver - between toes Wound #15 Right,Dorsal Foot: Calcium Alginate with Silver - between toes Secondary  Dressing: Wound #12 Right,Dorsal Toe Second: ABD pad Wound #15 Right,Dorsal Foot: ABD pad Dressing Change Frequency: Wound #12 Right,Dorsal Toe Second: Change dressing every other day. - or as needed for soiled or drainage Wound #15 Right,Dorsal Foot: Change dressing every other day. - or as needed for soiled or drainage Follow-up Appointments: Wound #12 Right,Dorsal Toe Second: Return Appointment in 2 weeks. Wound #15 Right,Dorsal Foot: Return Appointment in 2 weeks. Edema Control: Wound #12 Right,Dorsal Toe Second: Other: - kerlix, coban from base of toes to three finger width below knee Wound #15 Right,Dorsal Foot: Other: - kerlix, coban from base of toes to three finger width below knee I have recommended: 1. Continue with silver alginate over the wounds and a light Kerlix wrap 2. elevation of the limbs have been emphasized and I have asked her to sit in a recliner whenever possible 3. Offloading of the heel and flotation as much as possible. She may use a Sage boat from time to time 4. Adequate protein and good control of her diabetic diet 5. Vitamin K, vitamin C and zinc He is on complex care and will be seen back as needed every couple of weeks. YAIRE, KREHER ([id number]) Electronic Signature(s) Signed: 04/15/2016 5:04:18 PM By: Evlyn Kanner MD, FACS Previous Signature: 04/15/2016 11:51:19 AM Version By: Evlyn Kanner MD, FACS Entered By: Evlyn Kanner on 04/15/2016 17:04:18 [name] ([id number]) -------------------------------------------------------------------------------- SuperBill Details Patient Name: [name] Date of Service: 04/15/2016 Medical Record Number: [id number] Patient Account Number: [id number] Date of Birth/Sex: [birth year] (82 y.o. 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 L97.812 Non-pressure chronic ulcer of other part of right lower leg with fat layer exposed Facility Procedures CPT4 Code: [id number] Description: 64403 - WOUND CARE VISIT-LEV 2 EST PT Modifier: Quantity: 1 Physician Procedures CPT4: Description Modifier Quantity Code [id number] - WC PHYS LEVEL 3 - EST PT 1 ICD-10 Description Diagnosis E11.622 Type 2 diabetes mellitus with other skin ulcer I70.235 Atherosclerosis of native arteries of right leg with ulceration of other part  of foot 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

## 2016-04-17 NOTE — Progress Notes (Signed)
JARYN, HOCUTT ([id number]) Visit Report for 04/15/2016 Arrival Information Details Patient Name: TELISSA, PALMISANO Date of Service: 04/15/2016 10:00 AM Medical Record Number: [id number] Patient Account Number: [id number] Date of Birth/Sex: [birth year] (82 y.o. Female) Treating RN: Huel Coventry Primary Care Physician: Dorothey Baseman Other Clinician: Referring Physician: Dorothey Baseman Treating Physician/Extender: Rudene Re in Treatment: 4 Visit Information History Since Last Visit All ordered tests and consults were completed: No Patient Arrived: Wheel Chair Added or deleted any medications: No Arrival Time: 10:11 Any new allergies or adverse reactions: No Accompanied By: daughter in law Had a fall or experienced change in No Transfer Assistance: None activities of daily living that may affect Patient Identification Verified: Yes risk of falls: Secondary Verification Process Yes Signs or symptoms of abuse/neglect since last No Completed: visito Patient Requires Transmission- No Hospitalized since last visit: No Based Precautions: Has Dressing in Place as Prescribed: Yes Patient Has Alerts: Yes Pain Present Now: No Patient Alerts: Patient on Blood Thinner DM II Eliquis Electronic Signature(s) Signed: 04/15/2016 10:39:04 AM By: Riki Sheer Entered By: Riki Sheer on 04/15/2016 10:12:57 [name] ([id number]) -------------------------------------------------------------------------------- Clinic Level of Care Assessment Details Patient Name: [name] Date of Service: 04/15/2016 10:00 AM Medical Record Number: [id number] Patient Account Number: [id number] Date of Birth/Sex: May 22, 1921 (82 y.o. Female) Treating RN: Huel Coventry Primary Care Physician: Terance Hart, DAVID Other Clinician: Referring Physician: Dorothey Baseman Treating Physician/Extender: Rudene Re in Treatment: 4 Clinic Level of Care Assessment Items TOOL 4 Quantity Score X -  Use when only an EandM is performed on FOLLOW-UP visit 1 0 ASSESSMENTS - Nursing Assessment / Reassessment []  - Reassessment of Co-morbidities (includes updates in patient status) 0 []  - Reassessment of Adherence to Treatment Plan 0 ASSESSMENTS - Wound and Skin Assessment / Reassessment []  - Simple Wound Assessment / Reassessment - one wound 0 X - Complex Wound Assessment / Reassessment - multiple wounds 1 5 []  - Dermatologic / Skin Assessment (not related to wound area) 0 ASSESSMENTS - Focused Assessment []  - Circumferential Edema Measurements - multi extremities 0 []  - Nutritional Assessment / Counseling / Intervention 0 []  - Lower Extremity Assessment (monofilament, tuning fork, pulses) 0 []  - Peripheral Arterial Disease Assessment (using hand held doppler) 0 ASSESSMENTS - Ostomy and/or Continence Assessment and Care []  - Incontinence Assessment and Management 0 []  - Ostomy Care Assessment and Management (repouching, etc.) 0 PROCESS - Coordination of Care X - Simple Patient / Family Education for ongoing care 1 15 []  - Complex (extensive) Patient / Family Education for ongoing care 0 []  - Staff obtains Chiropractor, Records, Test Results / Process Orders 0 []  - Staff telephones HHA, Nursing Homes / Clarify orders / etc 0 []  - Routine Transfer to another Facility (non-emergent condition) 0 [place], Makaylia ([id number]) []  - Routine Hospital Admission (non-emergent condition) 0 []  - New Admissions / Manufacturing engineer / Ordering NPWT, Apligraf, etc. 0 []  - Emergency Hospital Admission (emergent condition) 0 []  - Simple Discharge Coordination 0 []  - Complex (extensive) Discharge Coordination 0 PROCESS - Special Needs []  - Pediatric / Minor Patient Management 0 []  - Isolation Patient Management 0 []  - Hearing / Language / Visual special needs 0 []  - Assessment of Community assistance (transportation, D/C planning, etc.) 0 []  - Additional assistance / Altered mentation 0 []  - Support  Surface(s) Assessment (bed, cushion, seat, etc.) 0 INTERVENTIONS - Wound Cleansing / Measurement []  - Simple Wound Cleansing - one wound 0 X - Complex Wound Cleansing - multiple wounds 1 5 X -  Wound Imaging (photographs - any number of wounds) 1 5 []  - Wound Tracing (instead of photographs) 0 []  - Simple Wound Measurement - one wound 0 X - Complex Wound Measurement - multiple wounds 1 5 INTERVENTIONS - Wound Dressings []  - Small Wound Dressing one or multiple wounds 0 X - Medium Wound Dressing one or multiple wounds 1 15 []  - Large Wound Dressing one or multiple wounds 0 []  - Application of Medications - topical 0 []  - Application of Medications - injection 0 INTERVENTIONS - Miscellaneous []  - External ear exam 0 [place], Nhung ([id number]) []  - Specimen Collection (cultures, biopsies, blood, body fluids, etc.) 0 []  - Specimen(s) / Culture(s) sent or taken to Lab for analysis 0 []  - Patient Transfer (multiple staff / Michiel Sites Lift / Similar devices) 0 []  - Simple Staple / Suture removal (25 or less) 0 []  - Complex Staple / Suture removal (26 or more) 0 []  - Hypo / Hyperglycemic Management (close monitor of Blood Glucose) 0 []  - Ankle / Brachial Index (ABI) - do not check if billed separately 0 []  - Vital Signs 0 Has the patient been seen at the hospital within the last three years: Yes Total Score: 50 Level Of Care: New/Established - Level 2 Electronic Signature(s) Signed: 04/15/2016 11:47:22 AM By: Riki Sheer Entered By: Riki Sheer on 04/15/2016 11:06:55 [name] ([id number]) -------------------------------------------------------------------------------- Encounter Discharge Information Details Patient Name: [name] Date of Service: 04/15/2016 10:00 AM Medical Record Number: [id number] Patient Account Number: [id number] Date of Birth/Sex: September 10, 1921 (82 y.o. Female) Treating RN: Huel Coventry Primary Care Physician: Dorothey Baseman Other Clinician: Referring  Physician: Dorothey Baseman Treating Physician/Extender: Rudene Re in Treatment: 4 Encounter Discharge Information Items Discharge Pain Level: 1 Discharge Condition: Stable Ambulatory Status: Wheelchair Discharge Destination: Home Transportation: Private Auto daughtger in Accompanied By: law Schedule Follow-up Appointment: Yes Medication Reconciliation completed and provided to Patient/Care Yes Annebelle Bostic: Provided on Clinical Summary of Care: 04/15/2016 Form Type Recipient Paper Patient HS Electronic Signature(s) Signed: 04/16/2016 11:16:59 AM By: Elliot Gurney RN, BSN, Kim RN, BSN Previous Signature: 04/15/2016 11:10:14 AM Version By: Gwenlyn Perking Entered By: Elliot Gurney RN, BSN, Kim on 04/15/2016 11:11:10 [name] ([id number]) -------------------------------------------------------------------------------- Lower Extremity Assessment Details Patient Name: [name] Date of Service: 04/15/2016 10:00 AM Medical Record Number: [id number] Patient Account Number: [id number] Date of Birth/Sex: May 02, 1921 (82 y.o. Female) Treating RN: Huel Coventry Primary Care Physician: Dorothey Baseman Other Clinician: Referring Physician: Dorothey Baseman Treating Physician/Extender: Rudene Re in Treatment: 4 Edema Assessment Assessed: [Left: No] [Right: No] Edema: [Left: Ye] [Right: s] Vascular Assessment Pulses: Posterior Tibial Extremity colors, hair growth, and conditions: Hair Growth on Extremity: [Right:Yes] Temperature of Extremity: [Right:Warm] Capillary Refill: [Right:< 3 seconds] Dependent Rubor: [Right:No] Blanched when Elevated: [Right:No] Lipodermatosclerosis: [Right:No] Toe Nail Assessment Left: Right: Thick: Yes Discolored: Yes Deformed: No Improper Length and Hygiene: No Electronic Signature(s) Signed: 04/15/2016 10:39:04 AM By: Riki Sheer Signed: 04/16/2016 11:16:59 AM By: Elliot Gurney RN, BSN, Kim RN, BSN Entered By: Riki Sheer on 04/15/2016  10:17:35 [name] ([id number]) -------------------------------------------------------------------------------- Multi Wound Chart Details Patient Name: [name] Date of Service: 04/15/2016 10:00 AM Medical Record Number: [id number] Patient Account Number: [id number] Date of Birth/Sex: 12-Dec-1920 (82 y.o. Female) Treating RN: Huel Coventry Primary Care Physician: Dorothey Baseman Other Clinician: Referring Physician: Dorothey Baseman Treating Physician/Extender: Rudene Re in Treatment: 4 Vital Signs Height(in): Pulse(bpm): 84 Weight(lbs): Blood Pressure 124/111 (mmHg): Body Mass Index(BMI): Temperature(F): 97.84 Respiratory Rate 20 (breaths/min): Photos: [12:No Photos] [13:No Photos] [14:No Photos] Wound Location: [  12:Right Toe Second - Dorsal Right, Medial Metatarsal] [13:head first] [14:Right, Posterior Lower Leg] Wounding Event: [12:Pressure Injury] [13:Pressure Injury] [14:Gradually Appeared] Primary Etiology: [12:Pressure Ulcer] [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00%] [14:100.00%] % Reduction in Volume: 68.40% [13:100.00%] [14:100.00%] Classification: [12:Category/Stage II] [13:Grade 1] [14:Grade 1] HBO Classification: [12:Grade 1] [13:N/A] [14:N/A] Exudate Amount: [12:Medium] [13:N/A] [14:N/A] Exudate Type: [12:Serosanguineous] [13:N/A]  [14:N/A] Exudate Color: [12:red, brown] [13:N/A] [14:N/A] Wound Margin: [12:Flat and Intact] [13:N/A] [14:N/A] Granulation Amount: [12:Medium (34-66%)] [13:N/A] [14:N/A] Granulation Quality: [12:Pink] [13:N/A] [14:N/A] Necrotic Amount: None Present (0%) N/A N/A Exposed Structures: Fascia: No N/A N/A Fat: No Tendon: No Muscle: No Joint: No Bone: No Limited to Skin Breakdown Epithelialization: Medium (34-66%) N/A N/A Periwound Skin Texture: Edema: Yes No Abnormalities Noted No Abnormalities Noted Excoriation: No Induration: No Callus: No Crepitus: No Fluctuance: No Friable: No Rash: No Scarring: No Periwound Skin Maceration: Yes No Abnormalities Noted No Abnormalities Noted Moisture: Moist: Yes Dry/Scaly: Yes Periwound Skin Color: Hemosiderin Staining: Yes No Abnormalities Noted No Abnormalities Noted Atrophie Blanche: No Cyanosis: No Ecchymosis: No Erythema: No Mottled: No Pallor: No Rubor: No Erythema Location: N/A N/A N/A Temperature: No Abnormality N/A N/A Tenderness on Yes No No Palpation: Wound Preparation: Ulcer Cleansing: N/A N/A Rinsed/Irrigated with Saline Topical Anesthetic Applied: Other: lidocaine 4% Wound Number: 15 N/A N/A Photos: No Photos N/A N/A Wound Location: Right Foot - Dorsal N/A N/A Wounding Event: Gradually Appeared N/A N/A Primary Etiology: Diabetic Wound/Ulcer of N/A N/A the Lower Extremity Secondary Etiology: N/A N/A N/A Comorbid History: N/A N/A Mcphatter, Mitchell ([id number]) Arrhythmia, Congestive Heart Failure, Deep Vein Thrombosis, Peripheral Arterial Disease, Type II Diabetes, Gout, Osteoarthritis Date Acquired: 04/15/2016 N/A N/A Weeks of Treatment: 0 N/A N/A Wound Status: Open N/A N/A Measurements L x W x D 13x12x0.1 N/A N/A (cm) Area (cm) : 122.522 N/A N/A Volume (cm) : 12.252 N/A N/A % Reduction in Area: N/A N/A N/A % Reduction in Volume: N/A N/A N/A Classification: Grade 1 N/A N/A HBO Classification: N/A N/A  N/A Exudate Amount: Medium N/A N/A Exudate Type: Serosanguineous N/A N/A Exudate Color: red, brown N/A N/A Wound Margin: Flat and Intact N/A N/A Granulation Amount: Small (1-33%) N/A N/A Granulation Quality: Pink N/A N/A Necrotic Amount: None Present (0%) N/A N/A Exposed Structures: Fascia: No N/A N/A Fat: No Tendon: No Muscle: No Joint: No Bone: No Limited to Skin Breakdown Epithelialization: None N/A N/A Periwound Skin Texture: Edema: No N/A N/A Excoriation: No Induration: No Callus: No Crepitus: No Fluctuance: No Friable: No Rash: No Scarring: No Periwound Skin Maceration: No N/A N/A Moisture: Moist: No Dry/Scaly: No Periwound Skin Color: Erythema: Yes N/A N/A Hemosiderin Staining: Yes Atrophie Blanche: No Cyanosis: No Ecchymosis: No Bloodgood, Louiza ([id number]) Mottled: No Pallor: No Rubor: No Erythema Location: Circumferential N/A N/A Temperature: N/A N/A N/A Tenderness on No N/A N/A Palpation: Wound Preparation: Ulcer Cleansing: N/A N/A Rinsed/Irrigated with Saline Topical Anesthetic Applied: Other: lidocaine 4% Treatment Notes Electronic Signature(s) Signed: 04/15/2016 10:39:04 AM By: Riki Sheer Entered By: Riki Sheer on 04/15/2016 10:29:26 [name] ([id number]) -------------------------------------------------------------------------------- Multi-Disciplinary Care Plan Details  Patient Name: JALEYA, PEBLEY Date of Service: 04/15/2016 10:00 AM Medical Record Number: [id number] Patient Account Number: [id number] Date of Birth/Sex: Jul 30, 1921 (82 y.o. Female) Treating RN: Huel Coventry Primary Care Physician: Dorothey Baseman Other Clinician: Referring Physician: Dorothey Baseman Treating Physician/Extender: Rudene Re in Treatment: 4 Active Inactive Electronic Signature(s) Signed: 04/15/2016 11:47:22 AM By: Riki Sheer Signed: 04/16/2016 11:16:59 AM By: Elliot Gurney RN, BSN, Kim RN, BSN Previous Signature: 04/15/2016 10:39:04 AM  Version By: Riki Sheer Entered By: Riki Sheer on 04/15/2016 10:52:50 [name] ([id number]) -------------------------------------------------------------------------------- Non-Wound Condition Assessment Details Patient Name: [name] Date of Service: 04/15/2016 10:00 AM Medical Record Number: [id number] Patient Account Number: [id number] Date of Birth/Sex: 05-20-1921 (82 y.o. Female) Treating RN: Huel Coventry Primary Care Physician: Terance Hart, DAVID Other Clinician: Referring Physician: Terance Hart, DAVID Treating Physician/Extender: Rudene Re in Treatment: 4 Non-Wound Condition: Condition: Other Dermatologic Condition Location: Foot Side: Right Periwound Skin Texture Texture Color No Abnormalities Noted: No No Abnormalities Noted: No Callus: No Atrophie Blanche: No Crepitus: No Cyanosis: No Excoriation: No Ecchymosis: No Fluctuance: No Erythema: No Friable: No Hemosiderin Staining: Yes Induration: No Mottled: No Localized Edema: Yes Pallor: No Rash: No Rubor: No Scarring: No Temperature / Pain Moisture Temperature: No Abnormality No Abnormalities Noted: No Dry / Scaly: No Maceration: Yes Moist: Yes Electronic Signature(s) Signed: 04/15/2016 10:39:04 AM By: Riki Sheer Signed: 04/16/2016 11:16:59 AM By: Elliot Gurney RN, BSN, Kim RN, BSN Entered By: Riki Sheer on 04/15/2016 10:27:08 [name] ([id number]) -------------------------------------------------------------------------------- Pain Assessment Details Patient Name: [name] Date of Service: 04/15/2016 10:00 AM Medical Record Number: [id number] Patient Account Number: [id number] Date of Birth/Sex: Apr 02, 1921 (82 y.o. Female) Treating RN: Huel Coventry Primary Care Physician: Dorothey Baseman Other Clinician: Referring Physician: Dorothey Baseman Treating Physician/Extender: Rudene Re in Treatment: 4 Active Problems Location of Pain Severity and Description of  Pain Patient Has Paino Yes Site Locations Rate the pain. Current Pain Level: 7 Pain Management and Medication Current Pain Management: Electronic Signature(s) Signed: 04/15/2016 10:39:04 AM By: Riki Sheer Signed: 04/16/2016 11:16:59 AM By: Elliot Gurney, RN, BSN, Kim RN, BSN Entered By: Riki Sheer on 04/15/2016 10:13:19 [name] ([id number]) -------------------------------------------------------------------------------- Patient/Caregiver Education Details Patient Name: [name] Date of Service: 04/15/2016 10:00 AM Medical Record Number: [id number] Patient Account Number: [id number] Date of Birth/Gender: 21-Jan-1921 (82 y.o. Female) Treating RN: Huel Coventry Primary Care Physician: Dorothey Baseman Other Clinician: Referring Physician: Dorothey Baseman Treating Physician/Extender: Rudene Re in Treatment: 4 Education Assessment Education Provided To: Caregiver Education Topics Provided Wound/Skin Impairment: Handouts: Caring for Your Ulcer Methods: Demonstration, Explain/Verbal Responses: State content correctly Electronic Signature(s) Signed: 04/16/2016 11:16:59 AM By: Elliot Gurney, RN, BSN, Kim RN, BSN Entered By: Elliot Gurney, RN, BSN, Kim on 04/15/2016 11:11:32 [name] ([id number]) -------------------------------------------------------------------------------- Wound Assessment Details Patient Name: [name] Date of Service: 04/15/2016 10:00 AM Medical Record Number: [id number] Patient Account Number: [id number] Date of Birth/Sex: 11/10/1920 (82 y.o. Female) Treating RN: Huel Coventry Primary Care Physician: Dorothey Baseman Other Clinician: Referring Physician: Dorothey Baseman Treating Physician/Extender: Rudene Re in Treatment: 4 Wound Status Wound Number: 12 Primary Pressure Ulcer Etiology: Wound Location: Right Toe Second - Dorsal Wound Open Wounding Event: Pressure Injury Status: Date Acquired: 01/26/2015 Comorbid Arrhythmia, Congestive  Heart Failure, Weeks Of Treatment: 4 History: Deep Vein Thrombosis, Peripheral Clustered Wound: No Arterial Disease, Type II Diabetes, Gout, Osteoarthritis Photos Wound Measurements Length: (cm) 0.3 Width: (cm) 0.5 Depth: (cm) 0.1 Area: (cm) 0.118 Volume: (cm) 0.012 % Reduction in Area: 69.4% % Reduction in Volume: 68.4% Epithelialization: Medium (34-66%) Tunneling:  No Wound Description Classification: Category/Stage II Foul Odor Diabetic Severity (Wagner): Grade 1 Wound Margin: Flat and Intact Exudate Amount: Medium Exudate Type: Serosanguineous Exudate Color: red, brown After Cleansing: No Wound Bed Granulation Amount: Medium (34-66%) Exposed Structure Granulation Quality: Pink Fascia Exposed: No Necrotic Amount: None Present (0%) Fat Layer Exposed: No Dickman, Shweta ([id number]) Tendon Exposed: No Muscle Exposed: No Joint Exposed: No Bone Exposed: No Limited to Skin Breakdown Periwound Skin Texture Texture Color No Abnormalities Noted: No No Abnormalities Noted: No Callus: No Atrophie Blanche: No Crepitus: No Cyanosis: No Excoriation: No Ecchymosis: No Fluctuance: No Erythema: No Friable: No Hemosiderin Staining: Yes Induration: No Mottled: No Localized Edema: Yes Pallor: No Rash: No Rubor: No Scarring: No Temperature / Pain Moisture Temperature: No Abnormality No Abnormalities Noted: No Tenderness on Palpation: Yes Dry / Scaly: Yes Maceration: Yes Moist: Yes Wound Preparation Ulcer Cleansing: Rinsed/Irrigated with Saline Topical Anesthetic Applied: Other: lidocaine 4%, Treatment Notes Wound #12 (Right, Dorsal Toe Second) 1. Cleansed with: Clean wound with Normal Saline 4. Dressing Applied: Aquacel Ag 5. Secondary Dressing Applied ABD Pad Notes Kerlix and Coban to knee Electronic Signature(s) Signed: 04/15/2016 10:39:04 AM By: Riki Sheer Signed: 04/16/2016 11:16:59 AM By: Elliot Gurney RN, BSN, Kim RN, BSN Entered By: Riki Sheer  on 04/15/2016 10:31:41 [name] ([id number]) -------------------------------------------------------------------------------- Wound Assessment Details Patient Name: [name] Date of Service: 04/15/2016 10:00 AM Medical Record Number: [id number] Patient Account Number: [id number] Date of Birth/Sex: 05-Jan-1921 (82 y.o. Female) Treating RN: Huel Coventry Primary Care Physician: Terance Hart, DAVID Other Clinician: Referring Physician: Dorothey Baseman Treating Physician/Extender: Rudene Re in Treatment: 4 Wound Status Wound Number: 13 Primary Diabetic Wound/Ulcer of the Lower Etiology: Extremity Wound Location: Right Metatarsal head first - Medial Secondary Pressure Ulcer Etiology: Wounding Event: Pressure Injury Wound Healed - Epithelialized Date Acquired: 01/26/2015 Status: Weeks Of Treatment: 4 Comorbid Arrhythmia, Congestive Heart Failure, Clustered Wound: No History: Deep Vein Thrombosis, Peripheral Arterial Disease, Type II Diabetes, Gout, Osteoarthritis Photos Wound Measurements Length: (cm) 0 % Reduction i Width: (cm) 0 % Reduction i Depth: (cm) 0 Epithelializa Area: (cm) 0 Volume: (cm) 0 n Area: 100% n Volume: 100% tion: None Wound Description Classification: Grade 1 Wound Margin: Flat and Intact Exudate Amount: Medium Exudate Type: Serosanguineous Exudate Color: red, brown Foul Odor After Cleansing: No Wound Bed Granulation Amount: None Present (0%) Exposed Structure Necrotic Amount: Large (67-100%) Fascia Exposed: No Sans, Natally ([id number]) Necrotic Quality: Eschar, Adherent Slough Fat Layer Exposed: No Tendon Exposed: No Muscle Exposed: No Joint Exposed: No Bone Exposed: No Limited to Skin Breakdown Periwound Skin Texture Texture Color No Abnormalities Noted: No No Abnormalities Noted: No Callus: No Atrophie Blanche: No Crepitus: No Cyanosis: No Excoriation: No Ecchymosis: No Fluctuance: No Erythema: No Friable:  No Hemosiderin Staining: No Induration: No Mottled: No Localized Edema: No Pallor: No Rash: No Rubor: No Scarring: No Temperature / Pain Moisture Temperature: No Abnormality No Abnormalities Noted: No Tenderness on Palpation: Yes Dry / Scaly: Yes Maceration: No Moist: No Wound Preparation Ulcer Cleansing: Rinsed/Irrigated with Saline Topical Anesthetic Applied: Other: lidocaine 4%, Electronic Signature(s) Signed: 04/15/2016 10:39:04 AM By: Riki Sheer Signed: 04/16/2016 11:16:59 AM By: Elliot Gurney RN, BSN, Kim RN, BSN Entered By: Riki Sheer on 04/15/2016 10:32:56 [name] ([id number]) -------------------------------------------------------------------------------- Wound Assessment Details Patient Name: [name] Date of Service: 04/15/2016 10:00 AM Medical Record Number: [id number] Patient Account Number: [id number] Date of Birth/Sex: 10/24/1920 (82 y.o. Female) Treating RN: Huel Coventry Primary Care Physician: Dorothey Baseman Other Clinician: Referring Physician: Dorothey Baseman Treating  Physician/Extender: Rudene Re in Treatment: 4 Wound Status Wound Number: 14 Primary Diabetic Wound/Ulcer of the Lower Etiology: Extremity Wound Location: Right Lower Leg - Posterior Wound Healed - Epithelialized Wounding Event: Gradually Appeared Status: Date Acquired: 01/26/2015 Comorbid Arrhythmia, Congestive Heart Failure, Weeks Of Treatment: 4 History: Deep Vein Thrombosis, Peripheral Clustered Wound: No Arterial Disease, Type II Diabetes, Gout, Osteoarthritis Photos Wound Measurements Length: (cm) 0 Width: (cm) 0 Depth: (cm) 0 Area: (cm) 0 Volume: (cm) 0 % Reduction in Area: 100% % Reduction in Volume: 100% Epithelialization: None Wound Description Classification: Grade 1 Foul Odor Aft Wound Margin: Flat and Intact Exudate Amount: Medium Exudate Type: Serous Exudate Color: amber er Cleansing: No Wound Bed Granulation Amount: None Present  (0%) Exposed Structure Necrotic Amount: Large (67-100%) Fascia Exposed: No Necrotic Quality: Eschar Fat Layer Exposed: No Tendon Exposed: No Jabbour, Maddyn ([id number]) Muscle Exposed: No Joint Exposed: No Bone Exposed: No Limited to Skin Breakdown Periwound Skin Texture Texture Color No Abnormalities Noted: No No Abnormalities Noted: No Callus: No Atrophie Blanche: No Crepitus: No Cyanosis: No Excoriation: No Ecchymosis: No Fluctuance: No Erythema: No Friable: No Hemosiderin Staining: No Induration: No Mottled: No Localized Edema: No Pallor: No Rash: No Rubor: No Scarring: No Temperature / Pain Moisture Temperature: No Abnormality No Abnormalities Noted: No Tenderness on Palpation: Yes Dry / Scaly: No Maceration: No Moist: Yes Wound Preparation Ulcer Cleansing: Rinsed/Irrigated with Saline Topical Anesthetic Applied: Other: lidocaine 4%, Electronic Signature(s) Signed: 04/15/2016 10:39:04 AM By: Riki Sheer Signed: 04/16/2016 11:16:59 AM By: Elliot Gurney RN, BSN, Kim RN, BSN Entered By: Riki Sheer on 04/15/2016 10:33:53 [name] ([id number]) -------------------------------------------------------------------------------- Wound Assessment Details Patient Name: [name] Date of Service: 04/15/2016 10:00 AM Medical Record Number: [id number] Patient Account Number: [id number] Date of Birth/Sex: [birth year] (82 y.o. Female) Treating RN: Huel Coventry Primary Care Physician: Terance Hart, DAVID Other Clinician: Referring Physician: Dorothey Baseman Treating Physician/Extender: Rudene Re in Treatment: 4 Wound Status Wound Number: 15 Primary Diabetic Wound/Ulcer of the Lower Etiology: Extremity Wound Location: Right Foot - Dorsal Wound Open Wounding Event: Gradually Appeared Status: Date Acquired: 04/15/2016 Comorbid Arrhythmia, Congestive Heart Failure, Weeks Of Treatment: 0 History: Deep Vein Thrombosis, Peripheral Clustered Wound:  No Arterial Disease, Type II Diabetes, Gout, Osteoarthritis Photos Wound Measurements Length: (cm) 13 Width: (cm) 12 Depth: (cm) 0.1 Area: (cm) 122.522 Volume: (cm) 12.252 % Reduction in Area: 0% % Reduction in Volume: 0% Epithelialization: None Tunneling: No Undermining: No Wound Description Classification: Grade 1 Foul Odor Aft Wound Margin: Flat and Intact Exudate Amount: Medium Exudate Type: Serosanguineous Exudate Color: red, brown er Cleansing: No Wound Bed Granulation Amount: Small (1-33%) Exposed Structure Granulation Quality: Pink Fascia Exposed: No Necrotic Amount: None Present (0%) Fat Layer Exposed: No Tendon Exposed: No Myler, Navie ([id number]) Muscle Exposed: No Joint Exposed: No Bone Exposed: No Limited to Skin Breakdown Periwound Skin Texture Texture Color No Abnormalities Noted: Yes No Abnormalities Noted: No Atrophie Blanche: No Moisture Cyanosis: No No Abnormalities Noted: No Ecchymosis: No Dry / Scaly: No Erythema: Yes Maceration: No Erythema Location: Circumferential Moist: No Hemosiderin Staining: Yes Mottled: No Pallor: No Rubor: No Wound Preparation Ulcer Cleansing: Rinsed/Irrigated with Saline Topical Anesthetic Applied: Other: lidocaine 4%, Treatment Notes Wound #15 (Right, Dorsal Foot) 1. Cleansed with: Clean wound with Normal Saline 4. Dressing Applied: Aquacel Ag 5. Secondary Dressing Applied ABD Pad Notes Kerlix and Coban to knee Electronic Signature(s) Signed: 04/15/2016 10:39:04 AM By: Riki Sheer Signed: 04/16/2016 11:16:59 AM By: Elliot Gurney, RN, BSN, Kim RN, BSN Entered By: Mechele Collin,  Debra on 04/15/2016 10:32:10 NALEAH, KOFOED ([id number]) -------------------------------------------------------------------------------- Vitals Details Patient Name: DESERAY, DAPONTE Date of Service: 04/15/2016 10:00 AM Medical Record Number: [id number] Patient Account Number: [id number] Date of Birth/Sex: July 17, 1921 (82 y.o.  Female) Treating RN: Huel Coventry Primary Care Physician: Dorothey Baseman Other Clinician: Referring Physician: Dorothey Baseman Treating Physician/Extender: Rudene Re in Treatment: 4 Vital Signs Time Taken: 10:12 Temperature (F): 97.84 Pulse (bpm): 84 Respiratory Rate (breaths/min): 20 Blood Pressure (mmHg): 124/111 Reference Range: 80 - 120 mg / dl Electronic Signature(s) Signed: 04/15/2016 10:39:04 AM By: Riki Sheer Entered By: Riki Sheer on 04/15/2016 10:14:09

## 2016-04-29 ENCOUNTER — Encounter: Payer: Medicare Other | Attending: Surgery | Admitting: Surgery

## 2016-04-29 DIAGNOSIS — Z89512 Acquired absence of left leg below knee: Secondary | ICD-10-CM | POA: Diagnosis not present

## 2016-04-29 DIAGNOSIS — L97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: Secondary | ICD-10-CM | POA: Insufficient documentation

## 2016-04-29 DIAGNOSIS — L97812 Non-pressure chronic ulcer of other part of right lower leg with fat layer exposed: Secondary | ICD-10-CM | POA: Diagnosis not present

## 2016-04-29 DIAGNOSIS — E11622 Type 2 diabetes mellitus with other skin ulcer: Secondary | ICD-10-CM | POA: Diagnosis not present

## 2016-04-29 DIAGNOSIS — I70235 Atherosclerosis of native arteries of right leg with ulceration of other part of foot: Secondary | ICD-10-CM | POA: Insufficient documentation

## 2016-04-30 NOTE — Progress Notes (Signed)
[name], [name] ([id number]) Visit Report for 04/29/2016 Chief Complaint Document Details Patient Name: [name], [name] Date of Service: 04/29/2016 10:00 AM Medical Record Number: [id number] Patient Account Number: [id number] Date of Birth/Sex: 07-19-1921 (82 y.o. Female) Treating RN: Curtis Sites Primary Care Physician: Dorothey Baseman Other Clinician: Referring Physician: Dorothey Baseman Treating Physician/Extender: Rudene Re in Treatment: 6 Information Obtained from: Patient Chief Complaint 82 year old patient who is very well-known to as returns after a recent admission to hospital and his prolonged stay in the nursing home. Electronic Signature(s) Signed: 04/29/2016 10:48:07 AM By: Evlyn Kanner MD, FACS Entered By: Evlyn Kanner on 04/29/2016 10:48:06 [name] ([id number]) -------------------------------------------------------------------------------- Debridement Details Patient Name: [name] Date of Service: 04/29/2016 10:00 AM Medical Record Number: [id number] Patient Account Number: [id number] Date of Birth/Sex: 09-08-1921 (82 y.o. Female) Treating RN: Curtis Sites Primary Care Physician: Terance Hart, DAVID Other Clinician: Referring Physician: Terance Hart DAVID Treating Physician/Extender: Rudene Re in Treatment: 6 Debridement Performed for Wound #12 Right,Dorsal Toe Second Assessment: Performed By: Physician Evlyn Kanner, MD Debridement: Debridement Pre-procedure Yes Verification/Time Out Taken: Start Time: 08:37 Pain Control: Lidocaine 4% Topical Solution Level: Skin/Subcutaneous Tissue Total Area Debrided (L x 0.3 (cm) x 0.5 (cm) = 0.15 (cm) W): Tissue and other Viable, Non-Viable, Eschar, Fibrin/Slough, Skin, Subcutaneous material debrided: Instrument: Forceps Bleeding: None End Time: 08:40 Procedural Pain: 0 Post Procedural Pain: 0 Response to Treatment: Procedure was tolerated well Post Debridement Measurements of Total  Wound Length: (cm) 0.3 Stage: Category/Stage II Width: (cm) 0.5 Depth: (cm) 0.1 Volume: (cm) 0.012 Post Procedure Diagnosis Same as Pre-procedure Electronic Signature(s) Signed: 04/29/2016 10:47:59 AM By: Evlyn Kanner MD, FACS Signed: 04/29/2016 4:49:27 PM By: Curtis Sites Entered By: Evlyn Kanner on 04/29/2016 10:47:59 [name] ([id number]) -------------------------------------------------------------------------------- HPI Details Patient Name: [name] Date of Service: 04/29/2016 10:00 AM Medical Record Number: [id number] Patient Account Number: [id number] Date of Birth/Sex: 1921/09/14 (82 y.o. Female) Treating RN: Curtis Sites Primary Care Physician: Dorothey Baseman Other Clinician: Referring Physician: Dorothey Baseman Treating Physician/Extender: Rudene Re in Treatment: 6 History of Present Illness Location: right medial heel and right second toe Quality: Patient reports experiencing a dull pain to affected area(s). Severity: Patient states wound are getting worse. Duration: Patient has had the wound for > 6months prior to seeking treatment at the wound center Timing: Pain in wound is Intermittent (comes and goes Context: The wound appeared gradually over time Modifying Factors: Consults to this date include:vascular surgeon and several recent surgeries. HPI Description: A pleasant 82 year old patient who is very well-known to my practice was seen about a month and a half ago until she was admitted to hospital and is known to have diabetes mellitus for several years has recently gone through a series of operations with the vascular surgeon Dr. Gilda Crease. In January and February she had several surgeries on the left lower extremity with an attempt to have limb salvage for gangrenous changes of her forefoot. 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 On 01/07/2015 she also had  a right lower extremity distal runoff with a angioplasty of the right anterior tibial artery to maximize her blood flow to the foot. She recently had her staples removed at Dr. Marijean Heath office on 01/31/2015 and at that time a right lower extremity duplex was done which showed patent vessels and a patent stent with distal occluded posterior tibial artery. Though the duplex was noncritical the recommendations from the PA at the vascular surgery office  was that of a angiogram to be done but the patient said she would rather try some bone care before. The patient has received doxycycline and Cipro in the recent past and she takes oral medications for her diabetes. Other than that I reviewed her list of all her medications. No recent hemoglobin A1c has been done and no recent x-rays of the right foot have been done. 02/18/2015 -- x-ray of the right foot was done on 02/11/2015 and it shows no evidence of acute osteomyelitis of the third toe or of the calcaneus. She had gone to the vascular surgery office and they had noted that there is dehiscence of the left part of the amputation site of the below-knee wound and they have asked [place] to kindly take over the care of this. There've been doing dressings for the right heel and right third toe. 02/25/2015 -- we have received notes from the vascular group who saw her last on 02/13/2015 and she was seen by the PA Ms. Cleda Daub. She had recommended that the patient continue to follow with [place] for wound care including management of the left BKA stump, which has had dehiscence of the lateral part. They will consider repeating a arterial duplex or angiogram if the right lower extremity does not heal within a reasonable period of time. 03/18/2015 -- he saw the vascular surgeon Dr. Gilda Crease and he has set her up for an angioplasty sometime in the middle of July. she is doing well otherwise. 04/10/2015 -- the patient had a procedure done on 04/08/2015 and  this was a right angioplasty of the Nicoll, Luria ([id number]) dorsalis pedis, anterior tibial artery, first superficial femoral artery in its midportion. There was successful intervention with recanalization and in-line flow to the right foot. 04/22/2015 -- the patient was looking rather pale today and the daughter confirms that her hemoglobin was down to 6.9. she is being monitored by her PCP. Her Lasix dose was increased and her edema has gone down significantly. 04/29/2015 -- she had vascular studies done and the ABI on the right is 1.3 and the toe pressures were within normal limits. Her hemoglobin is still around 7 and she refuses to take any blood transfusions for religious reasons. Her potassium was normal. 06/10/2015 -- she has a new blister on her right lateral and posterior part of her leg just in the region where the Kerlix bandages were applied and this may be due to an abrasion. 06/24/2015 -- On her right lower extremity she has developed several more blisters which look like small pustules and the drain informed shallow ulcerations. 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 07/01/2015 -- she has an appointment with the PCP this Friday and her vascular surgeon on Monday. Other than that she is on doxycycline and has finished 1 week of treatment. The pustules she had all over have now resolved. 07/08/2015 -- she saw the vascular surgeon who did studies in the office and found that there is poor circulation in the distal lower right leg and has set her up for an angiogram and possible stenting next Tuesday. 07/22/2015 -- on October 18 she was taken up for a successful revascularization of the anterior tibial vessel on the right side. a PTA and stent placement was done to the right anterior tibial artery. 08/19/2015 -- she has broken out with some linear ulceration in the region of her webspaces of her toes and this is something new. 08/26/2015 -- over the last 3 days there was a streak  of redness going from her lower extremity towards her toes but she had no fever or discharge from the wounds. 09/02/2015 -- the redness and cellulitis has gone down but she continues to have a lot of edema of the right lower extremity. 10/28/2015 -- over the last week she has been draining a lot of fluid from her right lower extremities and several of the wounds which had healed in the past and now opened up again 11/06/2015 -- she was seen in the vascular office and the right ABI was more than 1.3 which was consistent with noncompressible arteries due to medial calcification and the right great toe pressure was 0.24 and the PPG waveform showed significant decrease. from talking to the family member I believe no procedure has been planned. She was also seen by her PCP was increased her Lasix to 80 mg a day and has ordered some lab work but these reports are not back. 11/20/2015 -- Recent labs done hemoglobin A1c was 6.2, INR was .93, iron-binding capacity was 310, BMP was within normal limits except for the BUN which was 40 and albumin was 3.1. Her hemoglobin is up to 10.7 with a hematocrit of 34.2 he was advised lymphedema pumps by Dr. Gilda Crease and she has not been wearing these. Her daughter-in- law also says that she has been very noncompliant about elevation of her limbs. 12/18/2015 -- she was seen by Dr. Gilda Crease who agreed with the management and did not think she required any intervention. He discussed elevation and use of lymph pump and will see her back as required. 01/15/2016 -- we have been seeing help me every other week and from what I understand from the family member she is quite stubborn and noncompliant and does not use any elevation of follow the advice saying [name], [name] ([id number]) most medications and applications are causing her pain. 03/12/2016 -- was admitted to the hospital on 02/03/2016 with a right lower extremity cellulitis. She was put on vancomycin and Azactam  and metronidazole. She was seen by Dr. Wyn Quaker who performed a aortogram and selective right lower extremity angiogram with a angioplasty of the proximal right anterior tibial artery and right posterior tibial artery and peroneal artery. She was also seen by Dr. Sampson Goon of infectious disease who recommended changing over to ciprofloxacin 500 mg by mouth twice a day for 7 more days and doxycycline 100 mg by mouth twice a day for 7 more days. She had 9 days of IV antibiotics including vancomycin, aztreonam and Flagyl and the wound was improving. He recommended that she follow-up with me at the wound center and if her leg was worsening repeat cultures could be done and he would make appropriate changes to the antibiotics. She was discharged home on 02/13/2016 with oral ciprofloxacin and doxycycline. She was seen in follow-up on 02/27/2016 at the vascular office for a postop visit status post right lower extremity angiogram and the postprocedure course was found to be uneventful. He was told to come back in a month for an ABI and right lower extremity duplex study to assess her PAD. of note recent x-rays done on April 27 and May 17 did not show any osteomyelitis of the second toe x-ray of the right foot -- IMPRESSION:Findings compatible with cellulitis of the second toe. No objective evidence of osteomyelitis but there is severe diffuse osteopenia which limits sensitivity of the study. 04/15/2016 -- agent has now got dependent edema and has been oozing fluid from the lower extremity and from the  right foot. He is rather stubborn about not elevating her leg. 04/29/2016 -- she was seen in the vascular office and at that time had a significant cellulitis and was put on oral doxycycline for 2 weeks. This seems to have helped her immensely and she was also wrapped with an wound Unna's boots. Electronic Signature(s) Signed: 04/29/2016 10:48:43 AM By: Evlyn Kanner MD, FACS Entered By: Evlyn Kanner on  04/29/2016 10:48:42 [name] ([id number]) -------------------------------------------------------------------------------- Physical Exam Details Patient Name: [name] Date of Service: 04/29/2016 10:00 AM Medical Record Number: [id number] Patient Account Number: [id number] Date of Birth/Sex: [birth year] (82 y.o. Female) Treating RN: Curtis Sites Primary Care Physician: Dorothey Baseman Other Clinician: Referring Physician: Dorothey Baseman Treating Physician/Extender: Rudene Re in Treatment: 6 Constitutional . Pulse regular. Respirations normal and unlabored. Afebrile. . Eyes Nonicteric. Reactive to light. Ears, Nose, Mouth, and Throat Lips, teeth, and gums WNL.Marland Kitchen Moist mucosa without lesions. Neck supple and nontender. No palpable supraclavicular or cervical adenopathy. Normal sized without goiter. Respiratory WNL. No retractions.. Cardiovascular Pedal Pulses WNL. No clubbing, cyanosis or edema. Lymphatic No adneopathy. No adenopathy. No adenopathy. Musculoskeletal Adexa without tenderness or enlargement.. Digits and nails w/o clubbing, cyanosis, infection, petechiae, ischemia, or inflammatory conditions.. Integumentary (Hair, Skin) No suspicious lesions. No crepitus or fluctuance. No peri-wound warmth or erythema. No masses.Marland Kitchen Psychiatric Judgement and insight Intact.. No evidence of depression, anxiety, or agitation.. Notes the overall look of the leg is much better than before with clean granulation tissue was somewhat the wounds with no of cellulitis or lymphedema with weeping. Her right second toe had an ulceration on the dorsum of the foot and this was sharply debrided with a toothed forcep. Electronic Signature(s) Signed: 04/29/2016 10:50:16 AM By: Evlyn Kanner MD, FACS Entered By: Evlyn Kanner on 04/29/2016 10:50:15 [name] ([id number]) -------------------------------------------------------------------------------- Physician Orders  Details Patient Name: [name] Date of Service: 04/29/2016 10:00 AM Medical Record Number: [id number] Patient Account Number: [id number] Date of Birth/Sex: Jul 22, 1921 (82 y.o. Female) Treating RN: Clover Mealy, RN, BSN, Kincaid Sink Primary Care Physician: Dorothey Baseman Other Clinician: Referring Physician: Terance Hart DAVID Treating Physician/Extender: Rudene Re in Treatment: 6 Verbal / Phone Orders: Yes Clinician: Afful, RN, BSN, Rita Read Back and Verified: Yes Diagnosis Coding Wound Cleansing Wound #12 Right,Dorsal Toe Second o Clean wound with Normal Saline. o Cleanse wound with mild soap and water o May Shower, gently pat wound dry prior to applying new dressing. Wound #15 Right,Dorsal Foot o Clean wound with Normal Saline. o Cleanse wound with mild soap and water o May Shower, gently pat wound dry prior to applying new dressing. Skin Barriers/Peri-Wound Care Wound #12 Right,Dorsal Toe Second o Other: - TCA Cream Wound #15 Right,Dorsal Foot o Other: - TCA Cream Primary Wound Dressing Wound #12 Right,Dorsal Toe Second o Other: - Tritec Silver in between toes Wound #15 Right,Dorsal Foot o Other: - Tritec Silver dressing Secondary Dressing Wound #12 Right,Dorsal Toe Second o ABD pad Wound #15 Right,Dorsal Foot o ABD pad Dressing Change Frequency Wound #12 Right,Dorsal Toe Second o Change dressing every other day. - or as needed for soiled or drainage [name], [name] ([id number]) Wound #15 Right,Dorsal Foot o Change dressing every other day. - or as needed for soiled or drainage Follow-up Appointments Wound #12 Right,Dorsal Toe Second o Return Appointment in: - 3 weeks Wound #15 Right,Dorsal Foot o Return Appointment in: - 3 weeks Edema Control Wound #12 Right,Dorsal Toe Second o Other: - kerlix, coban from base of toes to three finger width below knee Wound #  15 Right,Dorsal Foot o Other: - kerlix, coban from base of toes to  three finger width below knee Additional Orders / Instructions Wound #12 Right,Dorsal Toe Second o Increase protein intake. o Activity as tolerated Wound #15 Right,Dorsal Foot o Increase protein intake. o Activity as tolerated Electronic Signature(s) Signed: 04/29/2016 4:00:53 PM By: Elpidio Eric BSN, RN Signed: 04/29/2016 4:34:24 PM By: Evlyn Kanner MD, FACS Entered By: Elpidio Eric on 04/29/2016 10:46:56 [name] ([id number]) -------------------------------------------------------------------------------- Problem List Details Patient Name: [name] Date of Service: 04/29/2016 10:00 AM Medical Record Number: [id number] Patient Account Number: [id number] Date of Birth/Sex: August 16, 1921 (82 y.o. 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 03/12/2016 Yes fat layer exposed L97.812 Non-pressure chronic ulcer of other part of right lower leg 03/12/2016 Yes with fat layer exposed Inactive Problems Resolved Problems Electronic Signature(s) Signed: 04/29/2016 10:47:29 AM By: Evlyn Kanner MD, FACS Entered By: Evlyn Kanner on 04/29/2016 10:47:28 [name] ([id number]) -------------------------------------------------------------------------------- Progress Note Details Patient Name: [name] Date of Service: 04/29/2016 10:00 AM Medical Record Number: [id number] Patient Account Number: [id number] Date of Birth/Sex: November 28, 1920 (82 y.o.  Female) Treating RN: Curtis Sites Primary Care Physician: Dorothey Baseman Other Clinician: Referring Physician: Dorothey Baseman Treating Physician/Extender: Rudene Re in Treatment: 6 Subjective Chief Complaint Information obtained from Patient 82 year old patient who is very well-known to as returns after a recent admission to hospital and his prolonged stay in the nursing home. History of Present Illness (HPI) The following HPI elements were documented for the patient's wound: Location: right medial heel and right second toe Quality: Patient reports experiencing a dull pain to affected area(s). Severity: Patient states wound are getting worse. Duration: Patient has had the wound for > 6months prior to seeking treatment at the wound center Timing: Pain in wound is Intermittent (comes and goes Context: The wound appeared gradually over time Modifying Factors: Consults to this date include:vascular surgeon and several recent surgeries. A pleasant 81 year old patient who is very well-known to my practice was seen about a month and a half ago until she was admitted to hospital and is known to have diabetes mellitus for several years has recently gone through a series of operations with the vascular surgeon Dr. Gilda Crease. In January and February she had several surgeries on the left lower extremity with an attempt to have limb salvage for gangrenous changes of her forefoot. 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 On 01/07/2015 she also had a right lower extremity distal runoff with a angioplasty of the right anterior tibial artery to maximize her blood flow to the foot. She recently had her staples removed at Dr. Marijean Heath office on 01/31/2015 and at that time a right lower extremity duplex was done which showed patent vessels and a patent stent with distal occluded posterior tibial artery. Though  the duplex was noncritical the recommendations from the PA at the vascular surgery office was that of a angiogram to be done but the patient said she would rather try some bone care before. The  patient has received doxycycline and Cipro in the recent past and she takes oral medications for her diabetes. Other than that I reviewed her list of all her medications. No recent hemoglobin A1c has been done and no recent x-rays of the right foot have been done. 02/18/2015 -- x-ray of the right foot was done on 02/11/2015 and it shows no evidence of acute osteomyelitis of the third toe or of the calcaneus. She had gone to the vascular surgery office and they had noted that there is dehiscence of the left part of the amputation site of the below-knee wound and they have asked [place] to kindly take over the care of this. There've been doing dressings for the right heel and right third toe. 02/25/2015 -- we have received notes from the vascular group who saw her last on 02/13/2015 and she [name], [name] ([id number]) was seen by the PA Ms. Cleda Daub. She had recommended that the patient continue to follow with [place] for wound care including management of the left BKA stump, which has had dehiscence of the lateral part. They will consider repeating a arterial duplex or angiogram if the right lower extremity does not heal within a reasonable period of time. 03/18/2015 -- he saw the vascular surgeon Dr. Gilda Crease and he has set her up for an angioplasty sometime in the middle of July. she is doing well otherwise. 04/10/2015 -- the patient had a procedure done on 04/08/2015 and this was a right angioplasty of the dorsalis pedis, anterior tibial artery, first superficial femoral artery in its midportion. There was successful intervention with recanalization and in-line flow to the right foot. 04/22/2015 -- the patient was looking rather pale today and the daughter confirms that her hemoglobin was down to 6.9.  she is being monitored by her PCP. Her Lasix dose was increased and her edema has gone down significantly. 04/29/2015 -- she had vascular studies done and the ABI on the right is 1.3 and the toe pressures were within normal limits. Her hemoglobin is still around 7 and she refuses to take any blood transfusions for religious reasons. Her potassium was normal. 06/10/2015 -- she has a new blister on her right lateral and posterior part of her leg just in the region where the Kerlix bandages were applied and this may be due to an abrasion. 06/24/2015 -- On her right lower extremity she has developed several more blisters which look like small pustules and the drain informed shallow ulcerations. 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 07/01/2015 -- she has an appointment with the PCP this Friday and her vascular surgeon on Monday. Other than that she is on doxycycline and has finished 1 week of treatment. The pustules she had all over have now resolved. 07/08/2015 -- she saw the vascular surgeon who did studies in the office and found that there is poor circulation in the distal lower right leg and has set her up for an angiogram and possible stenting next Tuesday. 07/22/2015 -- on October 18 she was taken up for a successful revascularization of the anterior tibial vessel on the right side. a PTA and stent placement was done to the right anterior tibial artery. 08/19/2015 -- she has broken out with some linear ulceration in the region of her webspaces of her toes and this is something new. 08/26/2015 -- over the last 3 days there was a streak of redness going from her lower extremity towards her toes but she had no fever or discharge from the wounds. 09/02/2015 --  the redness and cellulitis has gone down but she continues to have a lot of edema of the right lower extremity. 10/28/2015 -- over the last week she has been draining a lot of fluid from her right lower extremities and several of the wounds  which had healed in the past and now opened up again 11/06/2015 -- she was seen in the vascular office and the right ABI was more than 1.3 which was consistent with noncompressible arteries due to medial calcification and the right great toe pressure was 0.24 and the PPG waveform showed significant decrease. from talking to the family member I believe no procedure has been planned. She was also seen by her PCP was increased her Lasix to 80 mg a day and has ordered some lab work but these reports are not back. 11/20/2015 -- Recent labs done hemoglobin A1c was 6.2, INR was .93, iron-binding capacity was 310, BMP was within normal limits except for the BUN which was 40 and albumin was 3.1. Her hemoglobin is up to [name], [name] ([id number]) 10.7 with a hematocrit of 34.2 he was advised lymphedema pumps by Dr. Gilda Crease and she has not been wearing these. Her daughter-in- law also says that she has been very noncompliant about elevation of her limbs. 12/18/2015 -- she was seen by Dr. Gilda Crease who agreed with the management and did not think she required any intervention. He discussed elevation and use of lymph pump and will see her back as required. 01/15/2016 -- we have been seeing help me every other week and from what I understand from the family member she is quite stubborn and noncompliant and does not use any elevation of follow the advice saying most medications and applications are causing her pain. 03/12/2016 -- was admitted to the hospital on 02/03/2016 with a right lower extremity cellulitis. She was put on vancomycin and Azactam and metronidazole. She was seen by Dr. Wyn Quaker who performed a aortogram and selective right lower extremity angiogram with a angioplasty of the proximal right anterior tibial artery and right posterior tibial artery and peroneal artery. She was also seen by Dr. Sampson Goon of infectious disease who recommended changing over to ciprofloxacin 500 mg by mouth twice a day  for 7 more days and doxycycline 100 mg by mouth twice a day for 7 more days. She had 9 days of IV antibiotics including vancomycin, aztreonam and Flagyl and the wound was improving. He recommended that she follow-up with me at the wound center and if her leg was worsening repeat cultures could be done and he would make appropriate changes to the antibiotics. She was discharged home on 02/13/2016 with oral ciprofloxacin and doxycycline. She was seen in follow-up on 02/27/2016 at the vascular office for a postop visit status post right lower extremity angiogram and the postprocedure course was found to be uneventful. He was told to come back in a month for an ABI and right lower extremity duplex study to assess her PAD. of note recent x-rays done on April 27 and May 17 did not show any osteomyelitis of the second toe x-ray of the right foot -- IMPRESSION:Findings compatible with cellulitis of the second toe. No objective evidence of osteomyelitis but there is severe diffuse osteopenia which limits sensitivity of the study. 04/15/2016 -- agent has now got dependent edema and has been oozing fluid from the lower extremity and from the right foot. He is rather stubborn about not elevating her leg. 04/29/2016 -- she was seen in the vascular office and  at that time had a significant cellulitis and was put on oral doxycycline for 2 weeks. This seems to have helped her immensely and she was also wrapped with an wound Unna's boots. Objective Constitutional Pulse regular. Respirations normal and unlabored. Afebrile. Vitals Time Taken: 10:09 AM, Temperature: 97.9 F, Pulse: 60 bpm, Respiratory Rate: 18 breaths/min, Blood Pressure: 120/53 mmHg. Eyes Nonicteric. Reactive to light. [name], [name] ([id number]) Ears, Nose, Mouth, and Throat Lips, teeth, and gums WNL.Marland Kitchen Moist mucosa without lesions. Neck supple and nontender. No palpable supraclavicular or cervical adenopathy. Normal sized without  goiter. Respiratory WNL. No retractions.. Cardiovascular Pedal Pulses WNL. No clubbing, cyanosis or edema. Lymphatic No adneopathy. No adenopathy. No adenopathy. Musculoskeletal Adexa without tenderness or enlargement.. Digits and nails w/o clubbing, cyanosis, infection, petechiae, ischemia, or inflammatory conditions.Marland Kitchen Psychiatric Judgement and insight Intact.. No evidence of depression, anxiety, or agitation.. General Notes: the overall look of the leg is much better than before with clean granulation tissue was somewhat the wounds with no of cellulitis or lymphedema with weeping. Her right second toe had an ulceration on the dorsum of the foot and this was sharply debrided with a toothed forcep. Integumentary (Hair, Skin) No suspicious lesions. No crepitus or fluctuance. No peri-wound warmth or erythema. No masses.. Wound #12 status is Open. Original cause of wound was Pressure Injury. The wound is located on the Right,Dorsal Toe Second. The wound measures 0.3cm length x 0.5cm width x 0.1cm depth; 0.118cm^2 area and 0.012cm^3 volume. The wound is limited to skin breakdown. There is no tunneling or undermining noted. There is a medium amount of serosanguineous drainage noted. The wound margin is flat and intact. There is no granulation within the wound bed. There is a large (67-100%) amount of necrotic tissue within the wound bed including Eschar. The periwound skin appearance exhibited: Localized Edema, Dry/Scaly, Maceration, Moist, Hemosiderin Staining. The periwound skin appearance did not exhibit: Callus, Crepitus, Excoriation, Fluctuance, Friable, Induration, Rash, Scarring, Atrophie Blanche, Cyanosis, Ecchymosis, Mottled, Pallor, Rubor, Erythema. Periwound temperature was noted as No Abnormality. The periwound has tenderness on palpation. Wound #15 status is Open. Original cause of wound was Gradually Appeared. The wound is located on the Right,Dorsal Foot. The wound measures 0cm  length x 0cm width x 0cm depth; 0cm^2 area and 0cm^3 volume. The wound is limited to skin breakdown. There is no tunneling or undermining noted. There is a medium amount of serosanguineous drainage noted. The wound margin is flat and intact. There is no granulation within the wound bed. There is no necrotic tissue within the wound bed. The periwound skin appearance had no abnormalities noted for texture. The periwound skin appearance exhibited: Hemosiderin Staining, Erythema. The periwound skin appearance did not exhibit: Dry/Scaly, Maceration, Moist, Atrophie Blanche, Cyanosis, Ecchymosis, Mottled, Pallor, Rubor. The surrounding wound skin color is noted with [name], [name] ([id number]) erythema which is circumferential. 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 There was a Skin/Subcutaneous Tissue Debridement ([id number]) debridement with total area of 0.15 sq cm performed by Evlyn Kanner, MD. with the following instrument(s): Forceps to remove  Viable and Non-Viable tissue/material including Fibrin/Slough, Eschar, Skin, and Subcutaneous after achieving pain control using Lidocaine 4% Topical Solution. A time out was conducted prior to the start of the procedure. There was no bleeding. The procedure was tolerated well with a pain level of 0 throughout and a pain level of 0 following the procedure. Post Debridement Measurements: 0.3cm length x 0.5cm width x 0.1cm depth; 0.012cm^3 volume. Post debridement Stage noted as  Category/Stage II. Post procedure Diagnosis Wound #12: Same as Pre-Procedure Plan Wound Cleansing: Wound #12 Right,Dorsal Toe Second: Clean wound with Normal Saline. Cleanse wound with mild soap and water May Shower, gently pat wound dry prior to applying new dressing. Wound #15 Right,Dorsal Foot: Clean wound with Normal Saline. Cleanse wound with mild soap and water [name], [name] ([id number]) May Shower, gently pat wound dry prior to applying new dressing. Skin Barriers/Peri-Wound Care: Wound #12 Right,Dorsal Toe Second: Other: - TCA Cream Wound #15 Right,Dorsal Foot: Other: - TCA Cream Primary Wound Dressing: Wound #12 Right,Dorsal Toe Second: Other: - Tritec Silver in between toes Wound #15 Right,Dorsal Foot: Other: - Tritec Silver dressing Secondary Dressing: Wound #12 Right,Dorsal Toe Second: ABD pad Wound #15 Right,Dorsal Foot: ABD pad Dressing Change Frequency: Wound #12 Right,Dorsal Toe Second: Change dressing every other day. - or as needed for soiled or drainage Wound #15 Right,Dorsal Foot: Change dressing every other day. - or as needed for soiled or drainage Follow-up Appointments: Wound #12 Right,Dorsal Toe Second: Return Appointment in: - 3 weeks Wound #15 Right,Dorsal Foot: Return Appointment in: - 3 weeks Edema Control: Wound #12 Right,Dorsal Toe Second: Other: - kerlix, coban from base of toes to three finger width below knee Wound #15 Right,Dorsal Foot: Other: - kerlix, coban from base of toes to three finger width below knee Additional Orders / Instructions: Wound #12 Right,Dorsal Toe Second: Increase protein intake. Activity as tolerated Wound #15 Right,Dorsal Foot: Increase protein intake. Activity as tolerated I have recommended: 1. Continue with silver alginate over the wounds and a light Kerlix/coban wrap 2. elevation of the limbs have been emphasized and I have asked her to sit in a recliner whenever possible 3. Offloading of the heel  and flotation as much as possible. She may use a Sage boat from time to time [name], [name] ([id number]) 4. Adequate protein and good control of her diabetic diet 5. Vitamin K, vitamin C and zinc He is on complex care and will be seen back as needed every couple of weeks. Electronic Signature(s) Signed: 04/29/2016 10:50:53 AM By: Evlyn Kanner MD, FACS Entered By: Evlyn Kanner on 04/29/2016 10:50:53 [name] ([id number]) -------------------------------------------------------------------------------- SuperBill Details Patient Name: [name] Date of Service: 04/29/2016 Medical Record Number: [id number] Patient Account Number: [id number] Date of Birth/Sex: 12/19/1920 (82 y.o. 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 L97.812 Non-pressure chronic ulcer of other part of right lower leg with fat layer exposed Facility Procedures CPT4: Description Modifier Quantity Code [id number] 11042 - DEB SUBQ TISSUE 20 SQ CM/< 1 ICD-10 Description Diagnosis E11.622 Type 2 diabetes mellitus with other skin ulcer I70.235 Atherosclerosis of native arteries of right leg with ulceration of  other  part of foot 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 L97.812 Non-pressure chronic ulcer of other part of right lower leg with fat layer exposed Physician Procedures CPT4: Description Modifier Quantity Code [id number] 11042 - WC PHYS SUBQ TISS 20 SQ CM 1 ICD-10 Description Diagnosis E11.622 Type  2 diabetes mellitus with other skin ulcer I70.235 Atherosclerosis of native arteries of right leg with ulceration of other  part of foot 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

## 2016-04-30 NOTE — Progress Notes (Signed)
[name], [name] ([id number]) Visit Report for 04/29/2016 Arrival Information Details Patient Name: [name], [name] Date of Service: 04/29/2016 10:00 AM Medical Record Number: [id number] Patient Account Number: [id number] Date of Birth/Sex: May 13, 1921 (82 y.o. Female) Treating RN: Curtis Sites Primary Care Physician: Dorothey Baseman Other Clinician: Referring Physician: Terance Hart, DAVID Treating Physician/Extender: Rudene Re in Treatment: 6 Visit Information History Since Last Visit Added or deleted any medications: No Patient Arrived: Wheel Chair Any new allergies or adverse reactions: No Arrival Time: 10:06 Had a fall or experienced change in No Accompanied By: dtr activities of daily living that may affect Transfer Assistance: Manual risk of falls: Patient Identification Verified: Yes Signs or symptoms of abuse/neglect since last No Secondary Verification Process Yes visito Completed: Hospitalized since last visit: No Patient Requires Transmission- No Pain Present Now: Yes Based Precautions: Patient Has Alerts: Yes Patient Alerts: Patient on Blood Thinner DM II Eliquis Electronic Signature(s) Signed: 04/29/2016 4:49:27 PM By: Curtis Sites Entered By: Curtis Sites on 04/29/2016 10:08:40 [name] ([id number]) -------------------------------------------------------------------------------- Encounter Discharge Information Details Patient Name: [name] Date of Service: 04/29/2016 10:00 AM Medical Record Number: [id number] Patient Account Number: [id number] Date of Birth/Sex: 05-29-1921 (82 y.o. Female) Treating RN: Curtis Sites Primary Care Physician: Dorothey Baseman Other Clinician: Referring Physician: Dorothey Baseman Treating Physician/Extender: Rudene Re in Treatment: 6 Encounter Discharge Information Items Discharge Pain Level: 0 Discharge Condition: Stable Ambulatory Status: Wheelchair Discharge Destination:  Home Transportation: Private Auto Accompanied By: dtr in law Schedule Follow-up Appointment: Yes Medication Reconciliation completed No and provided to Patient/Care [name]: Provided on Clinical Summary of Care: 04/29/2016 Form Type Recipient Paper Patient HS Electronic Signature(s) Signed: 04/29/2016 11:29:21 AM By: Curtis Sites Previous Signature: 04/29/2016 11:04:39 AM Version By: Gwenlyn Perking Entered By: Curtis Sites on 04/29/2016 11:29:21 [name] ([id number]) -------------------------------------------------------------------------------- Lower Extremity Assessment Details Patient Name: [name] Date of Service: 04/29/2016 10:00 AM Medical Record Number: [id number] Patient Account Number: [id number] Date of Birth/Sex: [birth year] (82 y.o. Female) Treating RN: Curtis Sites Primary Care Physician: Dorothey Baseman Other Clinician: Referring Physician: Terance Hart, DAVID Treating Physician/Extender: Rudene Re in Treatment: 6 Vascular Assessment Pulses: Posterior Tibial Extremity colors, hair growth, and conditions: Extremity Color: [Right:Hyperpigmented] Hair Growth on Extremity: [Right:Yes] Temperature of Extremity: [Right:Warm] Capillary Refill: [Right:< 3 seconds] Electronic Signature(s) Signed: 04/29/2016 4:49:27 PM By: Curtis Sites Entered By: Curtis Sites on 04/29/2016 10:17:54 [name] ([id number]) -------------------------------------------------------------------------------- Multi Wound Chart Details Patient Name: [name] Date of Service: 04/29/2016 10:00 AM Medical Record Number: [id number] Patient Account Number: [id number] Date of Birth/Sex: [birth year] (82 y.o. 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 Comorbid History: Arrhythmia, Congestive Arrhythmia, Congestive N/A Heart Failure, Deep Vein Heart Failure, Deep Vein Thrombosis, Peripheral Thrombosis, Peripheral Arterial Disease, Type II Arterial Disease, Type II Diabetes, Gout, Diabetes, Gout, Osteoarthritis Osteoarthritis Date Acquired: 01/26/2015 04/15/2016 N/A Weeks of Treatment: 6 2 N/A Wound Status: Open Open N/A Measurements L x W x D 0.3x0.5x0.1 0x0x0 N/A (cm) Area (cm) : 0.118 0 N/A Volume (cm) : 0.012 0 N/A % Reduction in Area: 69.40% 100.00% N/A % Reduction in Volume: 68.40% 100.00% N/A Classification: Category/Stage  II Grade 1 N/A HBO Classification: Grade 1 N/A N/A Exudate Amount: Medium Medium N/A Exudate Type: Serosanguineous Serosanguineous N/A Beranek, Sabreen ([id number]) Exudate Color: red, brown red, brown N/A Wound Margin: Flat and Intact Flat and Intact N/A Granulation Amount: None Present (0%) None Present (0%) N/A Necrotic Amount: Large (67-100%) None Present (0%) N/A Necrotic Tissue: Eschar N/A N/A Exposed Structures: Fascia: No Fascia: No N/A Fat: No Fat: No Tendon: No Tendon: No Muscle: No Muscle: No Joint: No Joint: No Bone: No Bone: No Limited to Skin Limited to Skin Breakdown Breakdown Epithelialization: Medium (34-66%) Large (67-100%) N/A Periwound Skin Texture: Edema: Yes No Abnormalities Noted N/A Excoriation: No Induration: No Callus: No Crepitus: No Fluctuance: No Friable: No Rash: No Scarring: No Periwound Skin Maceration: Yes Maceration: No N/A Moisture: Moist: Yes Moist: No Dry/Scaly: Yes Dry/Scaly: No Periwound Skin Color: Hemosiderin  Staining: Yes Erythema: Yes N/A Atrophie Blanche: No Hemosiderin Staining: Yes Cyanosis: No Atrophie Blanche: No Ecchymosis: No Cyanosis: No Erythema: No Ecchymosis: No Mottled: No Mottled: No Pallor: No Pallor: No Rubor: No Rubor: No Erythema Location: N/A Circumferential N/A Temperature: No Abnormality N/A N/A Tenderness on Yes No N/A Palpation: Wound Preparation: Ulcer Cleansing: Ulcer Cleansing: N/A Rinsed/Irrigated with Rinsed/Irrigated with Saline Saline Topical Anesthetic Topical Anesthetic Applied: Other: lidocaine Applied: Other: lidocaine 4% 4% Treatment Notes Electronic Signature(s) JAYANI, ROZMAN ([id number]) Signed: 04/29/2016 4:00:53 PM By: Elpidio Eric BSN, RN Entered By: Elpidio Eric on 04/29/2016 10:36:18 [name] ([id number]) -------------------------------------------------------------------------------- Multi-Disciplinary Care Plan Details Patient Name: [name] Date of Service: 04/29/2016 10:00 AM Medical Record Number: [id number] Patient Account Number: [id number] Date of Birth/Sex: 02/21/1921 (82 y.o. Female) Treating RN: Clover Mealy, RN, BSN, [place] Sink Primary Care Physician: Dorothey Baseman Other Clinician: Referring Physician: Dorothey Baseman Treating Physician/Extender: Rudene Re in Treatment: 6 Active Inactive Electronic Signature(s) Signed: 04/29/2016 4:00:53 PM By: Elpidio Eric BSN, RN Entered By: Elpidio Eric on 04/29/2016 10:35:58 [name] ([id number]) -------------------------------------------------------------------------------- Pain Assessment Details Patient Name: [name] Date of Service: 04/29/2016 10:00 AM Medical Record Number: [id number] Patient Account Number: [id number] Date of Birth/Sex: Sep 01, 1921 (82 y.o. Female) Treating RN: Curtis Sites Primary Care Physician: Dorothey Baseman Other Clinician: Referring Physician: Dorothey Baseman Treating Physician/Extender: Rudene Re in Treatment:  6 Active Problems Location of Pain Severity and Description of Pain Patient Has Paino Yes Site Locations Pain Location: Pain in Ulcers With Dressing Change: Yes Duration of the Pain. Constant / Intermittento Constant Pain Management and Medication Current Pain Management: Notes Topical or injectable lidocaine is offered to patient for acute pain when surgical debridement is performed. If needed, Patient is instructed to use over the counter pain medication for the following 24-48 hours after debridement. Wound care MDs do not prescribed pain medications. Patient has chronic pain or uncontrolled pain. Patient has been instructed to make an appointment with their Primary Care Physician for pain management. Electronic Signature(s) Signed: 04/29/2016 4:49:27 PM By: Curtis Sites Entered By: Curtis Sites on 04/29/2016 10:09:00 [name] ([id number]) -------------------------------------------------------------------------------- Patient/Caregiver Education Details Patient Name: [name] Date of Service: 04/29/2016 10:00 AM Medical Record Number: [id number] Patient Account Number: [id number] Date of Birth/Gender: [birth year] (82 y.o. Female) Treating RN: Curtis Sites Primary Care Physician: Dorothey Baseman Other Clinician: Referring Physician: Dorothey Baseman Treating Physician/Extender: Rudene Re in Treatment: 6 Education Assessment Education Provided To: Patient Education Topics Provided Wound/Skin Impairment: Handouts: Other: new wound care as ordered Methods: Demonstration, Explain/Verbal Responses: State content correctly Electronic Signature(s) Signed: 04/29/2016 4:49:27 PM By: Curtis Sites Entered By: Curtis Sites on 04/29/2016  11:29:38 AHMAYA, HORNYAK ([id number]) -------------------------------------------------------------------------------- Wound Assessment Details Patient Name: CAMERAN, GURGANUS Date of Service: 04/29/2016 10:00 AM Medical  Record Number: [id number] Patient Account Number: [id number] Date of Birth/Sex: [birth year] (82 y.o. Female) Treating RN: Curtis Sites Primary Care Physician: Dorothey Baseman Other Clinician: Referring Physician: Terance Hart, DAVID Treating Physician/Extender: Rudene Re in Treatment: 6 Wound Status Wound Number: 12 Primary Pressure Ulcer Etiology: Wound Location: Right Toe Second - Dorsal Wound Open Wounding Event: Pressure Injury Status: Date Acquired: 01/26/2015 Comorbid Arrhythmia, Congestive Heart Failure, Weeks Of Treatment: 6 History: Deep Vein Thrombosis, Peripheral Clustered Wound: No Arterial Disease, Type II Diabetes, Gout, Osteoarthritis Photos Wound Measurements Length: (cm) 0.3 Width: (cm) 0.5 Depth: (cm) 0.1 Area: (cm) 0.118 Volume: (cm) 0.012 % Reduction in Area: 69.4% % Reduction in Volume: 68.4% Epithelialization: Medium (34-66%) Tunneling: No Undermining: No Wound Description Classification: Category/Stage II Foul Odor Diabetic Severity (Wagner): Grade 1 Wound Margin: Flat and Intact Exudate Amount: Medium Exudate Type: Serosanguineous Exudate Color: red, brown After Cleansing: No Wound Bed Granulation Amount: None Present (0%) Exposed Structure Necrotic Amount: Large (67-100%) Fascia Exposed: No Necrotic Quality: Eschar Fat Layer Exposed: No Kemler, Christien ([id number]) Tendon Exposed: No Muscle Exposed: No Joint Exposed: No Bone Exposed: No Limited to Skin Breakdown Periwound Skin Texture Texture Color No Abnormalities Noted: No No Abnormalities Noted: No Callus: No Atrophie Blanche: No Crepitus: No Cyanosis: No Excoriation: No Ecchymosis: No Fluctuance: No Erythema: No Friable: No Hemosiderin Staining: Yes Induration: No Mottled: No Localized Edema: Yes Pallor: No Rash: No Rubor: No Scarring: No Temperature / Pain Moisture Temperature: No Abnormality No Abnormalities Noted: No Tenderness on Palpation: Yes Dry /  Scaly: Yes Maceration: Yes Moist: Yes Wound Preparation Ulcer Cleansing: Rinsed/Irrigated with Saline Topical Anesthetic Applied: Other: lidocaine 4%, Treatment Notes Wound #12 (Right, Dorsal Toe Second) 1. Cleansed with: Cleanse wound with antibacterial soap and water 2. Anesthetic Topical Lidocaine 4% cream to wound bed prior to debridement 3. Peri-wound Care: Other peri-wound care (specify in notes) 4. Dressing Applied: Other dressing (specify in notes) 5. Secondary Dressing Applied ABD and Kerlix/Conform 7. Secured with Tape Notes kerlix and coban wrap, tca cream Electronic Signature(s) KUN, Zarya ([id number]) Signed: 04/29/2016 4:49:27 PM By: Curtis Sites Entered By: Curtis Sites on 04/29/2016 10:28:55 [name] ([id number]) -------------------------------------------------------------------------------- Wound Assessment Details Patient Name: [name] Date of Service: 04/29/2016 10:00 AM Medical Record Number: [id number] Patient Account Number: [id number] Date of Birth/Sex: 1921-07-30 (82 y.o. Female) Treating RN: Curtis Sites Primary Care Physician: Terance Hart, DAVID Other Clinician: Referring Physician: Terance Hart, DAVID Treating Physician/Extender: Rudene Re in Treatment: 6 Wound Status Wound Number: 15 Primary Diabetic Wound/Ulcer of the Lower Etiology: Extremity Wound Location: Right Foot - Dorsal Wound Open Wounding Event: Gradually Appeared Status: Date Acquired: 04/15/2016 Comorbid Arrhythmia, Congestive Heart Failure, Weeks Of Treatment: 2 History: Deep Vein Thrombosis, Peripheral Clustered Wound: No Arterial Disease, Type II Diabetes, Gout, Osteoarthritis Photos Wound Measurements Length: (cm) 0 % Reduction i Width: (cm) 0 % Reduction i Depth: (cm) 0 Epithelializa Area: (cm) 0 Tunneling: Volume: (cm) 0 Undermining: n Area: 100% n Volume: 100% tion: Large (67-100%) No No Wound Description Classification:  Grade 1 Wound Margin: Flat and Intact Exudate Amount: Medium Exudate Type: Serosanguineous Exudate Color: red, brown Foul Odor After Cleansing: No Wound Bed Granulation Amount: None Present (0%) Exposed Structure Necrotic Amount: None Present (0%) Fascia Exposed: No Fat Layer Exposed: No Tendon Exposed: No Risinger, Telesa ([id number]) Muscle Exposed: No Joint Exposed: No Bone Exposed: No Limited to Skin Breakdown Periwound Skin Texture  Texture Color No Abnormalities Noted: Yes No Abnormalities Noted: No Atrophie Blanche: No Moisture Cyanosis: No No Abnormalities Noted: No Ecchymosis: No Dry / Scaly: No Erythema: Yes Maceration: No Erythema Location: Circumferential Moist: No Hemosiderin Staining: Yes Mottled: No Pallor: No Rubor: No Wound Preparation Ulcer Cleansing: Rinsed/Irrigated with Saline Topical Anesthetic Applied: Other: lidocaine 4%, Treatment Notes Wound #15 (Right, Dorsal Foot) 1. Cleansed with: Cleanse wound with antibacterial soap and water 2. Anesthetic Topical Lidocaine 4% cream to wound bed prior to debridement 3. Peri-wound Care: Other peri-wound care (specify in notes) 4. Dressing Applied: Other dressing (specify in notes) 5. Secondary Dressing Applied ABD and Kerlix/Conform 7. Secured with Tape Notes kerlix and coban wrap, tca cream Electronic Signature(s) Signed: 04/29/2016 4:49:27 PM By: Curtis Sites Entered By: Curtis Sites on 04/29/2016 10:29:44 [name] ([id number]) -------------------------------------------------------------------------------- Vitals Details Patient Name: [name] Date of Service: 04/29/2016 10:00 AM Medical Record Number: [id number] Patient Account Number: [id number] Date of Birth/Sex: 05/13/1921 (82 y.o. Female) Treating RN: Curtis Sites Primary Care Physician: Dorothey Baseman Other Clinician: Referring Physician: Terance Hart, DAVID Treating Physician/Extender: Rudene Re in  Treatment: 6 Vital Signs Time Taken: 10:09 Temperature (F): 97.9 Pulse (bpm): 60 Respiratory Rate (breaths/min): 18 Blood Pressure (mmHg): 120/53 Reference Range: 80 - 120 mg / dl Electronic Signature(s) Signed: 04/29/2016 4:49:27 PM By: Curtis Sites Entered By: Curtis Sites on 04/29/2016 10:12:22

## 2016-05-20 ENCOUNTER — Encounter: Payer: Medicare Other | Admitting: Surgery

## 2016-05-20 DIAGNOSIS — E11622 Type 2 diabetes mellitus with other skin ulcer: Secondary | ICD-10-CM | POA: Diagnosis not present

## 2016-05-20 NOTE — Progress Notes (Signed)
[name], [name] ([id number]) Visit Report for 05/20/2016 Chief Complaint Document Details Patient Name: [name], [name] Date of Service: 05/20/2016 10:00 AM Medical Record Number: [id number] Patient Account Number: [id number] Date of Birth/Sex: 25-May-1921 (82 y.o. Female) Treating RN: Curtis Sites Primary Care Physician: Dorothey Baseman Other Clinician: Referring Physician: Dorothey Baseman Treating Physician/Extender: Rudene Re in Treatment: 9 Information Obtained from: Patient Chief Complaint 82 year old patient who is very well-known to as returns after a recent admission to hospital and his prolonged stay in the nursing home. Electronic Signature(s) Signed: 05/20/2016 11:08:55 AM By: Evlyn Kanner MD, FACS Entered By: Evlyn Kanner on 05/20/2016 11:08:55 [name] ([id number]) -------------------------------------------------------------------------------- HPI Details Patient Name: [name] Date of Service: 05/20/2016 10:00 AM Medical Record Number: [id number] Patient Account Number: [id number] Date of Birth/Sex: March 18, 1921 (82 y.o. Female) Treating RN: Curtis Sites Primary Care Physician: Dorothey Baseman Other Clinician: Referring Physician: Dorothey Baseman Treating Physician/Extender: Rudene Re in Treatment: 9 History of Present Illness Location: right medial heel and right second toe Quality: Patient reports experiencing a dull pain to affected area(s). Severity: Patient states wound are getting worse. Duration: Patient has had the wound for > 6months prior to seeking treatment at the wound [name] Timing: Pain in wound is Intermittent (comes and goes Context: The wound appeared gradually over time Modifying Factors: Consults to this date include:vascular surgeon and several recent surgeries. HPI Description: A pleasant 82 year old patient who is very well-known to my practice was seen about a month and a half ago until she was admitted to  hospital and is known to have diabetes mellitus for several years has recently gone through a series of operations with the vascular surgeon Dr. Gilda Crease. In January and February she had several surgeries on the left lower extremity with an attempt to have limb salvage for gangrenous changes of her forefoot. 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 On 01/07/2015 she also had a right lower extremity distal runoff with a angioplasty of the right anterior tibial artery to maximize her blood flow to the foot. She recently had her staples removed at Dr. Marijean Heath office on 01/31/2015 and at that time a right lower extremity duplex was done which showed patent vessels and a patent stent with distal occluded posterior tibial artery. Though the duplex was noncritical the recommendations from the PA at the vascular surgery office was that of a angiogram to be done but the patient said she would rather try some bone care before. The patient has received doxycycline and Cipro in the recent past and she takes oral medications for her diabetes. Other than that I reviewed her list of all her medications. No recent hemoglobin A1c has been done and no recent x-rays of the right foot have been done. 02/18/2015 -- x-ray of the right foot was done on 02/11/2015 and it shows no evidence of acute osteomyelitis of the third toe or of the calcaneus. She had gone to the vascular surgery office and they had noted that there is dehiscence of the left part of the amputation site of the below-knee wound and they have asked [place] to kindly take over the care of this. There've been doing dressings for the right heel and right third toe. 02/25/2015 -- we have received notes from the vascular group who saw her last on 02/13/2015 and she was seen by the PA Ms. Cleda Daub. She had recommended that the patient continue to follow with [place] for wound  care including management of the left BKA stump, which has had dehiscence of the lateral part. They will consider repeating a arterial duplex or angiogram if the right lower extremity does not heal within a reasonable period of time. 03/18/2015 -- he saw the vascular surgeon Dr. Gilda Crease and he has set her up for an angioplasty sometime in the middle of July. she is doing well otherwise. 04/10/2015 -- the patient had a procedure done on 04/08/2015 and this was a right angioplasty of the [place], [place] ([id number]) dorsalis pedis, anterior tibial artery, first superficial femoral artery in its midportion. There was successful intervention with recanalization and in-line flow to the right foot. 04/22/2015 -- the patient was looking rather pale today and the daughter confirms that her hemoglobin was down to 6.9. she is being monitored by her PCP. Her Lasix dose was increased and her edema has gone down significantly. 04/29/2015 -- she had vascular studies done and the ABI on the right is 1.3 and the toe pressures were within normal limits. Her hemoglobin is still around 7 and she refuses to take any blood transfusions for religious reasons. Her potassium was normal. 06/10/2015 -- she has a new blister on her right lateral and posterior part of her leg just in the region where the Kerlix bandages were applied and this may be due to an abrasion. 06/24/2015 -- On her right lower extremity she has developed several more blisters which look like small pustules and the drain informed shallow ulcerations. 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 07/01/2015 -- she has an appointment with the PCP this Friday and her vascular surgeon on Monday. Other than that she is on doxycycline and has finished 1 week of treatment. The pustules she had all over have now resolved. 07/08/2015 -- she saw the vascular surgeon who did studies in the office and found that there is poor circulation in the distal lower right leg and  has set her up for an angiogram and possible stenting next Tuesday. 07/22/2015 -- on October 18 she was taken up for a successful revascularization of the anterior tibial vessel on the right side. a PTA and stent placement was done to the right anterior tibial artery. 08/19/2015 -- she has broken out with some linear ulceration in the region of her webspaces of her toes and this is something new. 08/26/2015 -- over the last 3 days there was a streak of redness going from her lower extremity towards her toes but she had no fever or discharge from the wounds. 09/02/2015 -- the redness and cellulitis has gone down but she continues to have a lot of edema of the right lower extremity. 10/28/2015 -- over the last week she has been draining a lot of fluid from her right lower extremities and several of the wounds which had healed in the past and now opened up again 11/06/2015 -- she was seen in the vascular office and the right ABI was more than 1.3 which was consistent with noncompressible arteries due to medial calcification and the right great toe pressure was 0.24 and the PPG waveform showed significant decrease. from talking to the family member I believe no procedure has been planned. She was also seen by her PCP was increased her Lasix to 80 mg a day and has ordered some lab work but these reports are not back. 11/20/2015 -- Recent labs done hemoglobin A1c was 6.2, INR was .93, iron-binding capacity was 310, BMP was within normal limits except for the BUN which was  40 and albumin was 3.1. Her hemoglobin is up to 10.7 with a hematocrit of 34.2 he was advised lymphedema pumps by Dr. Gilda Crease and she has not been wearing these. Her daughter-in- law also says that she has been very noncompliant about elevation of her limbs. 12/18/2015 -- she was seen by Dr. Gilda Crease who agreed with the management and did not think she required any intervention. He discussed elevation and use of lymph pump and will  see her back as required. 01/15/2016 -- we have been seeing help me every other week and from what I understand from the family member she is quite stubborn and noncompliant and does not use any elevation of follow the advice saying Naeem, Tosca ([id number]) most medications and applications are causing her pain. 03/12/2016 -- was admitted to the hospital on 02/03/2016 with a right lower extremity cellulitis. She was put on vancomycin and Azactam and metronidazole. She was seen by Dr. Wyn Quaker who performed a aortogram and selective right lower extremity angiogram with a angioplasty of the proximal right anterior tibial artery and right posterior tibial artery and peroneal artery. She was also seen by Dr. Sampson Goon of infectious disease who recommended changing over to ciprofloxacin 500 mg by mouth twice a day for 7 more days and doxycycline 100 mg by mouth twice a day for 7 more days. She had 9 days of IV antibiotics including vancomycin, aztreonam and Flagyl and the wound was improving. He recommended that she follow-up with me at the wound [name] and if her leg was worsening repeat cultures could be done and he would make appropriate changes to the antibiotics. She was discharged home on 02/13/2016 with oral ciprofloxacin and doxycycline. She was seen in follow-up on 02/27/2016 at the vascular office for a postop visit status post right lower extremity angiogram and the postprocedure course was found to be uneventful. He was told to come back in a month for an ABI and right lower extremity duplex study to assess her PAD. of note recent x-rays done on April 27 and May 17 did not show any osteomyelitis of the second toe x-ray of the right foot -- IMPRESSION:Findings compatible with cellulitis of the second toe. No objective evidence of osteomyelitis but there is severe diffuse osteopenia which limits sensitivity of the study. 04/15/2016 -- agent has now got dependent edema and has been oozing  fluid from the lower extremity and from the right foot. He is rather stubborn about not elevating her leg. 04/29/2016 -- she was seen in the vascular office and at that time had a significant cellulitis and was put on oral doxycycline for 2 weeks. This seems to have helped her immensely and she was also wrapped with an wound Unna's boots. 05/20/2016 -- she did very well with [employer]-tech Ultrafoam but ran out of this and used a different alginate and this kept a lot of moisture in the wound Electronic Signature(s) Signed: 05/20/2016 11:09:38 AM By: Evlyn Kanner MD, FACS Entered By: Evlyn Kanner on 05/20/2016 11:09:38 [name] ([id number]) -------------------------------------------------------------------------------- Physical Exam Details Patient Name: [name] Date of Service: 05/20/2016 10:00 AM Medical Record Number: [id number] Patient Account Number: [id number] Date of Birth/Sex: 08-19-1921 (82 y.o. Female) Treating RN: Curtis Sites Primary Care Physician: Dorothey Baseman Other Clinician: Referring Physician: Dorothey Baseman Treating Physician/Extender: Rudene Re in Treatment: 9 Constitutional . Pulse regular. Respirations normal and unlabored. Afebrile. . Eyes Nonicteric. Reactive to light. Ears, Nose, Mouth, and Throat Lips, teeth, and gums WNL.Marland Kitchen Moist mucosa without lesions. Neck supple  and nontender. No palpable supraclavicular or cervical adenopathy. Normal sized without goiter. Respiratory WNL. No retractions.. Breath sounds WNL, No rubs, rales, rhonchi, or wheeze.. Cardiovascular Pedal Pulses WNL. No clubbing, cyanosis or edema. Lymphatic No adneopathy. No adenopathy. No adenopathy. Musculoskeletal Adexa without tenderness or enlargement.. Digits and nails w/o clubbing, cyanosis, infection, petechiae, ischemia, or inflammatory conditions.. Integumentary (Hair, Skin) No suspicious lesions. No crepitus or fluctuance. No peri-wound warmth or  erythema. No masses.Marland Kitchen Psychiatric Judgement and insight Intact.. No evidence of depression, anxiety, or agitation.. Notes the lower extremity on the right looks very good but her foot has significant fungal infection with a lot of moisture between her toes and sharp debridement was required today. Electronic Signature(s) Signed: 05/20/2016 11:10:31 AM By: Evlyn Kanner MD, FACS Entered By: Evlyn Kanner on 05/20/2016 11:10:30 [name] ([id number]) -------------------------------------------------------------------------------- Physician Orders Details Patient Name: [name] Date of Service: 05/20/2016 10:00 AM Medical Record Number: [id number] Patient Account Number: [id number] Date of Birth/Sex: December 07, 1920 (82 y.o. Female) Treating RN: Curtis Sites Primary Care Physician: Dorothey Baseman Other Clinician: Referring Physician: Dorothey Baseman Treating Physician/Extender: Rudene Re in Treatment: 9 Verbal / Phone Orders: Yes Clinician: Curtis Sites Read Back and Verified: Yes Diagnosis Coding Wound Cleansing Wound #12 Right,Dorsal Toe Second o Clean wound with Normal Saline. o Cleanse wound with mild soap and water o May Shower, gently pat wound dry prior to applying new dressing. Wound #15 Right,Dorsal Foot o Clean wound with Normal Saline. o Cleanse wound with mild soap and water o May Shower, gently pat wound dry prior to applying new dressing. Skin Barriers/Peri-Wound Care Wound #12 Right,Dorsal Toe Second o Other: - nystatin cream in clinic - patient prescribed lotrisone cream HHRN may use this on red moist areas Wound #15 Right,Dorsal Foot o Other: - nystatin cream in clinic - patient prescribed lotrisone cream HHRN may use this on red moist areas Primary Wound Dressing Wound #12 Right,Dorsal Toe Second o Other: - Tritec Silver in between toes Wound #15 Right,Dorsal Foot o Other: - Tritec Silver dressing Secondary  Dressing Wound #12 Right,Dorsal Toe Second o ABD pad Wound #15 Right,Dorsal Foot o ABD pad Dressing Change Frequency Wound #12 Right,Dorsal Toe Second Barthelemy, Tanita ([id number]) o Change dressing every other day. - or as needed for soiled or drainage Wound #15 Right,Dorsal Foot o Change dressing every other day. - or as needed for soiled or drainage Follow-up Appointments Wound #12 Right,Dorsal Toe Second o Return Appointment in: - 3 weeks - or sooner if needed Wound #15 Right,Dorsal Foot o Return Appointment in: - 3 weeks - or sooner if needed Edema Control Wound #12 Right,Dorsal Toe Second o Other: - kerlix, coban from base of toes to three finger width below knee Wound #15 Right,Dorsal Foot o Other: - kerlix, coban from base of toes to three finger width below knee Additional Orders / Instructions Wound #12 Right,Dorsal Toe Second o Increase protein intake. o Activity as tolerated Wound #15 Right,Dorsal Foot o Increase protein intake. o Activity as tolerated Patient Medications Allergies: cephalexin, tramadol, amoxicillin, prednisolone Notifications Medication Indication Start End clotrimazole-betamethasone 05/20/2016 DOSE topical 1 %-0.05 % cream - cream topical Electronic Signature(s) Signed: 05/20/2016 10:43:11 AM By: Evlyn Kanner MD, FACS Entered By: Evlyn Kanner on 05/20/2016 10:43:09 [name] ([id number]) -------------------------------------------------------------------------------- Problem List Details Patient Name: [name] Date of Service: 05/20/2016 10:00 AM Medical Record Number: [id number] Patient Account Number: [id number] Date of Birth/Sex: 09/24/1921 (82 y.o. 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 03/12/2016 Yes fat layer exposed L97.812 Non-pressure chronic ulcer of other part of right lower leg 03/12/2016 Yes with fat layer exposed Inactive Problems Resolved Problems Electronic Signature(s) Signed: 05/20/2016 11:08:47 AM By: Evlyn Kanner MD, FACS Entered By: Evlyn Kanner on 05/20/2016 11:08:47 [name] ([id number]) -------------------------------------------------------------------------------- Progress Note Details Patient Name: [name] Date of Service: 05/20/2016 10:00 AM Medical Record Number: [id number] Patient Account Number: [id number] Date of Birth/Sex: 10-May-1921 (82 y.o. Female) Treating RN: Curtis Sites Primary Care Physician: Dorothey Baseman Other Clinician: Referring Physician: Dorothey Baseman Treating Physician/Extender: Rudene Re in Treatment: 9 Subjective Chief Complaint Information obtained from Patient 82 year old patient who is very well-known to as returns after a recent admission to hospital and his prolonged stay in the nursing home. History of Present Illness (HPI) The following HPI elements were documented for the patient's wound: Location: right medial heel and right second toe Quality: Patient reports experiencing a dull pain to affected area(s). Severity: Patient states wound are getting worse. Duration: Patient has had the wound for > 6months prior to seeking treatment at the wound [name] Timing: Pain in wound is Intermittent (comes and goes Context: The wound appeared gradually over time Modifying Factors: Consults to this date include:vascular surgeon and several recent surgeries. A pleasant  82 year old patient who is very well-known to my practice was seen about a month and a half ago until she was admitted to hospital and is known to have diabetes mellitus for several years has recently gone through a series of operations with the vascular surgeon Dr. Gilda Crease. In January and February she had several surgeries on the left lower extremity with an attempt to have limb salvage for gangrenous changes of her forefoot. 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 On 01/07/2015 she also had a right lower extremity distal runoff with a angioplasty of the right anterior tibial artery to maximize her blood flow to the foot. She recently had her staples removed at Dr. Marijean Heath office on 01/31/2015 and at that time a right lower extremity duplex was done which showed patent vessels and a patent stent with distal occluded posterior tibial artery. Though the duplex was noncritical the recommendations from the PA at the vascular surgery office was that of a angiogram to be done but the patient said she would rather try some bone care before. The patient has received doxycycline and Cipro in the recent past and she takes oral medications for her diabetes. Other than that I reviewed her list of all her medications. No recent hemoglobin A1c has been done and no recent x-rays of the right foot have been done. 02/18/2015 -- x-ray of the right foot was done on 02/11/2015 and it shows no evidence of acute osteomyelitis of the third toe or of the calcaneus. She had gone to the vascular surgery office and they had noted that there is dehiscence of the left part of the amputation site of the below-knee wound and they have asked [place] to kindly take over the care of this. There've been doing dressings  for the right heel and right third toe. 02/25/2015 -- we have received notes from the vascular group who saw her last on 02/13/2015 and  she [name], [name] ([id number]) was seen by the PA Ms. Cleda Daub. She had recommended that the patient continue to follow with [place] for wound care including management of the left BKA stump, which has had dehiscence of the lateral part. They will consider repeating a arterial duplex or angiogram if the right lower extremity does not heal within a reasonable period of time. 03/18/2015 -- he saw the vascular surgeon Dr. Gilda Crease and he has set her up for an angioplasty sometime in the middle of July. she is doing well otherwise. 04/10/2015 -- the patient had a procedure done on 04/08/2015 and this was a right angioplasty of the dorsalis pedis, anterior tibial artery, first superficial femoral artery in its midportion. There was successful intervention with recanalization and in-line flow to the right foot. 04/22/2015 -- the patient was looking rather pale today and the daughter confirms that her hemoglobin was down to 6.9. she is being monitored by her PCP. Her Lasix dose was increased and her edema has gone down significantly. 04/29/2015 -- she had vascular studies done and the ABI on the right is 1.3 and the toe pressures were within normal limits. Her hemoglobin is still around 7 and she refuses to take any blood transfusions for religious reasons. Her potassium was normal. 06/10/2015 -- she has a new blister on her right lateral and posterior part of her leg just in the region where the Kerlix bandages were applied and this may be due to an abrasion. 06/24/2015 -- On her right lower extremity she has developed several more blisters which look like small pustules and the drain informed shallow ulcerations. 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 07/01/2015 -- she has an appointment with the PCP this Friday and her vascular surgeon on Monday. Other than that she is on doxycycline and has finished 1 week of treatment. The pustules she had all over have now resolved. 07/08/2015 -- she saw the  vascular surgeon who did studies in the office and found that there is poor circulation in the distal lower right leg and has set her up for an angiogram and possible stenting next Tuesday. 07/22/2015 -- on October 18 she was taken up for a successful revascularization of the anterior tibial vessel on the right side. a PTA and stent placement was done to the right anterior tibial artery. 08/19/2015 -- she has broken out with some linear ulceration in the region of her webspaces of her toes and this is something new. 08/26/2015 -- over the last 3 days there was a streak of redness going from her lower extremity towards her toes but she had no fever or discharge from the wounds. 09/02/2015 -- the redness and cellulitis has gone down but she continues to have a lot of edema of the right lower extremity. 10/28/2015 -- over the last week she has been draining a lot of fluid from her right lower extremities and several of the wounds which had healed in the past and now opened up again 11/06/2015 -- she was seen in the vascular office and the right ABI was more than 1.3 which was consistent with noncompressible arteries due to medial calcification and the right great toe pressure was 0.24 and the PPG waveform showed significant decrease. from talking to the family member I believe no procedure has been planned. She was also seen by her PCP  was increased her Lasix to 80 mg a day and has ordered some lab work but these reports are not back. 11/20/2015 -- Recent labs done hemoglobin A1c was 6.2, INR was .93, iron-binding capacity was 310, BMP was within normal limits except for the BUN which was 40 and albumin was 3.1. Her hemoglobin is up to [name], [name] ([id number]) 10.7 with a hematocrit of 34.2 he was advised lymphedema pumps by Dr. Gilda Crease and she has not been wearing these. Her daughter-in- law also says that she has been very noncompliant about elevation of her limbs. 12/18/2015 -- she was  seen by Dr. Gilda Crease who agreed with the management and did not think she required any intervention. He discussed elevation and use of lymph pump and will see her back as required. 01/15/2016 -- we have been seeing help me every other week and from what I understand from the family member she is quite stubborn and noncompliant and does not use any elevation of follow the advice saying most medications and applications are causing her pain. 03/12/2016 -- was admitted to the hospital on 02/03/2016 with a right lower extremity cellulitis. She was put on vancomycin and Azactam and metronidazole. She was seen by Dr. Wyn Quaker who performed a aortogram and selective right lower extremity angiogram with a angioplasty of the proximal right anterior tibial artery and right posterior tibial artery and peroneal artery. She was also seen by Dr. Sampson Goon of infectious disease who recommended changing over to ciprofloxacin 500 mg by mouth twice a day for 7 more days and doxycycline 100 mg by mouth twice a day for 7 more days. She had 9 days of IV antibiotics including vancomycin, aztreonam and Flagyl and the wound was improving. He recommended that she follow-up with me at the wound [name] and if her leg was worsening repeat cultures could be done and he would make appropriate changes to the antibiotics. She was discharged home on 02/13/2016 with oral ciprofloxacin and doxycycline. She was seen in follow-up on 02/27/2016 at the vascular office for a postop visit status post right lower extremity angiogram and the postprocedure course was found to be uneventful. He was told to come back in a month for an ABI and right lower extremity duplex study to assess her PAD. of note recent x-rays done on April 27 and May 17 did not show any osteomyelitis of the second toe x-ray of the right foot -- IMPRESSION:Findings compatible with cellulitis of the second toe. No objective evidence of osteomyelitis but there is severe  diffuse osteopenia which limits sensitivity of the study. 04/15/2016 -- agent has now got dependent edema and has been oozing fluid from the lower extremity and from the right foot. He is rather stubborn about not elevating her leg. 04/29/2016 -- she was seen in the vascular office and at that time had a significant cellulitis and was put on oral doxycycline for 2 weeks. This seems to have helped her immensely and she was also wrapped with an wound Unna's boots. 05/20/2016 -- she did very well with [employer]-tech Ultrafoam but ran out of this and used a different alginate and this kept a lot of moisture in the wound Objective Constitutional Pulse regular. Respirations normal and unlabored. Afebrile. Vitals Time Taken: 10:09 AM, Temperature: 97.6 F, Pulse: 56 bpm, Respiratory Rate: 18 breaths/min, Blood Pressure: 118/42 mmHg. [name], [name] ([id number]) Eyes Nonicteric. Reactive to light. Ears, Nose, Mouth, and Throat Lips, teeth, and gums WNL.Marland Kitchen Moist mucosa without lesions. Neck supple and nontender. No palpable  supraclavicular or cervical adenopathy. Normal sized without goiter. Respiratory WNL. No retractions.. Breath sounds WNL, No rubs, rales, rhonchi, or wheeze.. Cardiovascular Pedal Pulses WNL. No clubbing, cyanosis or edema. Lymphatic No adneopathy. No adenopathy. No adenopathy. Musculoskeletal Adexa without tenderness or enlargement.. Digits and nails w/o clubbing, cyanosis, infection, petechiae, ischemia, or inflammatory conditions.Marland Kitchen Psychiatric Judgement and insight Intact.. No evidence of depression, anxiety, or agitation.. General Notes: the lower extremity on the right looks very good but her foot has significant fungal infection with a lot of moisture between her toes and sharp debridement was required today. Integumentary (Hair, Skin) No suspicious lesions. No crepitus or fluctuance. No peri-wound warmth or erythema. No masses.. Wound #12 status is Open. Original cause  of wound was Pressure Injury. The wound is located on the Right,Dorsal Toe Second. The wound measures 0.3cm length x 0.5cm width x 0.1cm depth; 0.118cm^2 area and 0.012cm^3 volume. The wound is limited to skin breakdown. There is no tunneling or undermining noted. There is a medium amount of serosanguineous drainage noted. The wound margin is flat and intact. There is small (1-33%) pink granulation within the wound bed. There is a large (67-100%) amount of necrotic tissue within the wound bed including Eschar. The periwound skin appearance exhibited: Localized Edema, Dry/Scaly, Maceration, Moist, Hemosiderin Staining. The periwound skin appearance did not exhibit: Callus, Crepitus, Excoriation, Fluctuance, Friable, Induration, Rash, Scarring, Atrophie Blanche, Cyanosis, Ecchymosis, Mottled, Pallor, Rubor, Erythema. Periwound temperature was noted as No Abnormality. The periwound has tenderness on palpation. Wound #15 status is Open. Original cause of wound was Gradually Appeared. The wound is located on the Right,Dorsal Foot. The wound measures 0.1cm length x 0.1cm width x 0.1cm depth; 0.008cm^2 area and 0.001cm^3 volume. The wound is limited to skin breakdown. There is no tunneling or undermining noted. There is a medium amount of serosanguineous drainage noted. The wound margin is flat and intact. There is no granulation within the wound bed. There is no necrotic tissue within the wound bed. The periwound skin appearance had no abnormalities noted for texture. The periwound skin appearance exhibited: Hemosiderin Staining, Erythema. The periwound skin appearance did not exhibit: Dry/Scaly, Maceration, Moist, Atrophie Blanche, Cyanosis, Ecchymosis, Mottled, Pallor, Rubor. The surrounding wound skin color [name], [name] ([id number]) is noted with erythema which is circumferential. 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 She will see is back next week if the problem persists. Elevation and exercise has also been emphasized. Plan Wound Cleansing: Wound #12 Right,Dorsal Toe Second: Clean wound with Normal Saline. Cleanse wound with mild soap and water May Shower, gently pat wound dry prior to applying new dressing. Wound #15 Right,Dorsal Foot: Clean wound with Normal Saline. Cleanse wound with mild soap and water May Shower, gently pat wound dry prior to applying new dressing. Skin Barriers/Peri-Wound Care: Wound #12 Right,Dorsal Toe Second: Other: - nystatin cream in clinic - patient prescribed lotrisone cream HHRN may use this on red moist areas Wound #15 Right,Dorsal Foot: Other: - nystatin cream in  clinic - patient prescribed lotrisone cream HHRN may use this on red moist areas Primary Wound Dressing: Wound #12 Right,Dorsal Toe Second: Other: - Tritec Silver in between toes Wound #15 Right,Dorsal Foot: Other: - Tritec Silver dressing Secondary Dressing: [name], [name] ([id number]) Wound #12 Right,Dorsal Toe Second: ABD pad Wound #15 Right,Dorsal Foot: ABD pad Dressing Change Frequency: Wound #12 Right,Dorsal Toe Second: Change dressing every other day. - or as needed for soiled or drainage Wound #15 Right,Dorsal Foot: Change dressing every other day. - or as needed for soiled or drainage Follow-up Appointments: Wound #12 Right,Dorsal Toe Second: Return Appointment in: - 3 weeks - or sooner if needed Wound #15  Right,Dorsal Foot: Return Appointment in: - 3 weeks - or sooner if needed Edema Control: Wound #12 Right,Dorsal Toe Second: Other: - kerlix, coban from base of toes to three finger width below knee Wound #15 Right,Dorsal Foot: Other: - kerlix, coban from base of toes to three finger width below knee Additional Orders / Instructions: Wound #12 Right,Dorsal Toe Second: Increase protein intake. Activity as tolerated Wound #15 Right,Dorsal Foot: Increase protein intake. Activity as tolerated The following medication(s) was prescribed: clotrimazole-betamethasone topical 1 %-0.05 % cream cream topical starting 05/20/2016 I have recommended Lotrisone ointment to be applied liberally daily and covered with some Drawtex, to be applied even between her toes so as to absorb the moisture. She will see is back next week if the problem persists. Elevation and exercise has also been emphasized. Electronic Signature(s) Signed: 05/20/2016 11:11:33 AM By: Evlyn KannerBritto, Brettney Ficken MD, FACS Entered By: Evlyn KannerBritto, Delwin Raczkowski on 05/20/2016 11:11:33 [name], [name] ([id number]) -------------------------------------------------------------------------------- SuperBill Details Patient Name: [name], [name] Date of Service: 05/20/2016 Medical Record Number: [id number] Patient Account Number: [id number] Date of Birth/Sex: 10/01/1920 (82 y.o. 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 L97.812 Non-pressure chronic ulcer of other part of right lower leg  with fat layer exposed Facility Procedures CPT4 Code: [id number] Description: 99214 - WOUND CARE VISIT-LEV 4 EST PT Modifier: Quantity: 1 Physician Procedures CPT4: Description Modifier Quantity Code [id number] 99213 - WC PHYS LEVEL 3 - EST PT 1 ICD-10 Description Diagnosis E11.622 Type 2 diabetes mellitus with other skin ulcer I70.235 Atherosclerosis of native arteries of right leg with ulceration of other part  of foot 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

## 2016-05-21 NOTE — Progress Notes (Signed)
ALYSON, KI ([id number]) Visit Report for 05/20/2016 Arrival Information Details Patient Name: [name], [name] Date of Service: 05/20/2016 10:00 AM Medical Record Number: [id number] Patient Account Number: [id number] Date of Birth/Sex: [birth year] (82 y.o. Female) Treating RN: Curtis Sites Primary Care Physician: Dorothey Baseman Other Clinician: Referring Physician: Terance Hart, DAVID Treating Physician/Extender: Rudene Re in Treatment: 9 Visit Information History Since Last Visit Added or deleted any medications: No Patient Arrived: Wheel Chair Any new allergies or adverse reactions: No Arrival Time: 10:05 Had a fall or experienced change in No Accompanied By: dtr in law activities of daily living that may affect Transfer Assistance: Manual risk of falls: Patient Identification Verified: Yes Signs or symptoms of abuse/neglect since last No Secondary Verification Process Yes visito Completed: Hospitalized since last visit: No Patient Requires Transmission- No Pain Present Now: Yes Based Precautions: Patient Has Alerts: Yes Patient Alerts: Patient on Blood Thinner DM II Eliquis Electronic Signature(s) Signed: 05/20/2016 4:59:26 PM By: Curtis Sites Entered By: Curtis Sites on 05/20/2016 10:08:55 [name] ([id number]) -------------------------------------------------------------------------------- Clinic Level of Care Assessment Details Patient Name: [name] Date of Service: 05/20/2016 10:00 AM Medical Record Number: [id number] Patient Account Number: [id number] Date of Birth/Sex: [birth year][name] (82 y.o. Female) Treating RN: Curtis Sites Primary Care Physician: Terance Hart, DAVID Other Clinician: Referring Physician: Terance Hart, DAVID Treating Physician/Extender: Rudene Re in Treatment: 9 Clinic Level of Care Assessment Items TOOL 4 Quantity Score []  - Use when only an EandM is performed on FOLLOW-UP visit 0 ASSESSMENTS - Nursing  Assessment / Reassessment X - Reassessment of Co-morbidities (includes updates in patient status) 1 10 X - Reassessment of Adherence to Treatment Plan 1 5 ASSESSMENTS - Wound and Skin Assessment / Reassessment []  - Simple Wound Assessment / Reassessment - one wound 0 X - Complex Wound Assessment / Reassessment - multiple wounds 2 5 []  - Dermatologic / Skin Assessment (not related to wound area) 0 ASSESSMENTS - Focused Assessment []  - Circumferential Edema Measurements - multi extremities 0 []  - Nutritional Assessment / Counseling / Intervention 0 X - Lower Extremity Assessment (monofilament, tuning fork, pulses) 1 5 []  - Peripheral Arterial Disease Assessment (using hand held doppler) 0 ASSESSMENTS - Ostomy and/or Continence Assessment and Care []  - Incontinence Assessment and Management 0 []  - Ostomy Care Assessment and Management (repouching, etc.) 0 PROCESS - Coordination of Care X - Simple Patient / Family Education for ongoing care 1 15 []  - Complex (extensive) Patient / Family Education for ongoing care 0 []  - Staff obtains Chiropractor, Records, Test Results / Process Orders 0 []  - Staff telephones HHA, Nursing Homes / Clarify orders / etc 0 []  - Routine Transfer to another Facility (non-emergent condition) 0 Twilley, Kajal ([id number]) []  - Routine Hospital Admission (non-emergent condition) 0 []  - New Admissions / Manufacturing engineer / Ordering NPWT, Apligraf, etc. 0 []  - Emergency Hospital Admission (emergent condition) 0 X - Simple Discharge Coordination 1 10 []  - Complex (extensive) Discharge Coordination 0 PROCESS - Special Needs []  - Pediatric / Minor Patient Management 0 []  - Isolation Patient Management 0 []  - Hearing / Language / Visual special needs 0 []  - Assessment of Community assistance (transportation, D/C planning, etc.) 0 []  - Additional assistance / Altered mentation 0 []  - Support Surface(s) Assessment (bed, cushion, seat, etc.) 0 INTERVENTIONS - Wound  Cleansing / Measurement []  - Simple Wound Cleansing - one wound 0 X - Complex Wound Cleansing - multiple wounds 2 5 X - Wound Imaging (photographs - any number of wounds) 1 5 []  -  Wound Tracing (instead of photographs) 0 []  - Simple Wound Measurement - one wound 0 X - Complex Wound Measurement - multiple wounds 2 5 INTERVENTIONS - Wound Dressings []  - Small Wound Dressing one or multiple wounds 0 []  - Medium Wound Dressing one or multiple wounds 0 X - Large Wound Dressing one or multiple wounds 2 20 []  - Application of Medications - topical 0 []  - Application of Medications - injection 0 INTERVENTIONS - Miscellaneous []  - External ear exam 0 Rathod, Kahlyn ([id number]) []  - Specimen Collection (cultures, biopsies, blood, body fluids, etc.) 0 []  - Specimen(s) / Culture(s) sent or taken to Lab for analysis 0 []  - Patient Transfer (multiple staff / Michiel SitesHoyer Lift / Similar devices) 0 []  - Simple Staple / Suture removal (25 or less) 0 []  - Complex Staple / Suture removal (26 or more) 0 []  - Hypo / Hyperglycemic Management (close monitor of Blood Glucose) 0 []  - Ankle / Brachial Index (ABI) - do not check if billed separately 0 X - Vital Signs 1 5 Has the patient been seen at the hospital within the last three years: Yes Total Score: 125 Level Of Care: New/Established - Level 4 Electronic Signature(s) Signed: 05/20/2016 4:59:26 PM By: Curtis Sitesorthy, Joanna Entered By: Curtis Sitesorthy, Joanna on 05/20/2016 10:36:48 [name] NationsSILETZKY, [name] ([id number]) -------------------------------------------------------------------------------- Encounter Discharge Information Details Patient Name: [name] NationsSILETZKY, [name] Date of Service: 05/20/2016 10:00 AM Medical Record Number: [id number] Patient Account Number: [id number] Date of Birth/Sex: [birth year][name] (82 y.o. Female) Treating RN: Curtis Sitesorthy, Joanna Primary Care Physician: Dorothey BasemanBRONSTEIN, DAVID Other Clinician: Referring Physician: Dorothey BasemanBRONSTEIN, DAVID Treating Physician/Extender: Rudene ReBritto,  Errol Weeks in Treatment: 9 Encounter Discharge Information Items Discharge Pain Level: 0 Discharge Condition: Stable Ambulatory Status: Wheelchair Discharge Destination: Home Transportation: Private Auto Accompanied By: dtr in law Schedule Follow-up Appointment: Yes Medication Reconciliation completed and provided to Patient/Care No Evamae Rowen: Provided on Clinical Summary of Care: 05/20/2016 Form Type Recipient Paper Patient HS Electronic Signature(s) Signed: 05/20/2016 10:55:13 AM By: Gwenlyn PerkingMoore, Shelia Entered By: Gwenlyn PerkingMoore, Shelia on 05/20/2016 10:55:13 [name] NationsSILETZKY, [name] ([id number]) -------------------------------------------------------------------------------- Lower Extremity Assessment Details Patient Name: [name] NationsSILETZKY, [name] Date of Service: 05/20/2016 10:00 AM Medical Record Number: [id number] Patient Account Number: [id number] Date of Birth/Sex: [birth year][name] (82 y.o. Female) Treating RN: Curtis Sitesorthy, Joanna Primary Care Physician: Dorothey BasemanBRONSTEIN, DAVID Other Clinician: Referring Physician: Terance HartBRONSTEIN, DAVID Treating Physician/Extender: Rudene ReBritto, Errol Weeks in Treatment: 9 Vascular Assessment Pulses: Posterior Tibial Dorsalis Pedis Palpable: [Right:Yes] Extremity colors, hair growth, and conditions: Extremity Color: [Right:Red] Hair Growth on Extremity: [Right:Yes] Temperature of Extremity: [Right:Warm] Capillary Refill: [Right:< 3 seconds] Electronic Signature(s) Signed: 05/20/2016 4:59:26 PM By: Curtis Sitesorthy, Joanna Entered By: Curtis Sitesorthy, Joanna on 05/20/2016 10:31:18 [name] NationsSILETZKY, [name] ([id number]) -------------------------------------------------------------------------------- Multi Wound Chart Details Patient Name: [name] NationsSILETZKY, [name] Date of Service: 05/20/2016 10:00 AM Medical Record Number: [id number] Patient Account Number: [id number] Date of Birth/Sex: [birth year][name] (82 y.o. 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 Comorbid History: Arrhythmia, Congestive Arrhythmia, Congestive N/A Heart Failure, Deep Vein Heart Failure, Deep Vein Thrombosis, Peripheral Thrombosis, Peripheral Arterial Disease, Type II Arterial Disease, Type II Diabetes, Gout, Diabetes, Gout, Osteoarthritis Osteoarthritis Date Acquired: 01/26/2015 04/15/2016 N/A Weeks of Treatment: 9 5 N/A  Wound Status: Open Open N/A Measurements L x W x D 0.3x0.5x0.1 0.1x0.1x0.1 N/A (cm) Area (cm) : 0.118 0.008 N/A Volume (cm) : 0.012 0.001 N/A % Reduction in Area: 69.40% 100.00% N/A % Reduction in Volume: 68.40% 100.00% N/A Classification: Category/Stage II Grade 1 N/A HBO Classification: Grade 1 N/A N/A Exudate Amount: Medium Medium N/A Exudate Type: Serosanguineous Serosanguineous N/A Kearl, Bich ([id number]) Exudate Color: red, brown red, brown N/A Wound Margin: Flat and Intact Flat and Intact N/A Granulation Amount: Small (1-33%) None Present (0%) N/A Granulation Quality: Pink N/A N/A Necrotic Amount: Large (67-100%) None Present (0%) N/A Necrotic Tissue: Eschar N/A N/A Exposed Structures: Fascia: No Fascia: No N/A Fat: No Fat: No Tendon: No Tendon: No Muscle: No Muscle: No Joint: No Joint: No Bone: No Bone: No Limited to Skin Limited to Skin Breakdown Breakdown Epithelialization: Medium (34-66%) Large (67-100%) N/A Periwound Skin Texture: Edema: Yes No Abnormalities Noted N/A Excoriation: No Induration: No Callus: No Crepitus: No Fluctuance: No Friable:  No Rash: No Scarring: No Periwound Skin Maceration: Yes Maceration: No N/A Moisture: Moist: Yes Moist: No Dry/Scaly: Yes Dry/Scaly: No Periwound Skin Color: Hemosiderin Staining: Yes Erythema: Yes N/A Atrophie Blanche: No Hemosiderin Staining: Yes Cyanosis: No Atrophie Blanche: No Ecchymosis: No Cyanosis: No Erythema: No Ecchymosis: No Mottled: No Mottled: No Pallor: No Pallor: No Rubor: No Rubor: No Erythema Location: N/A Circumferential N/A Temperature: No Abnormality N/A N/A Tenderness on Yes No N/A Palpation: Wound Preparation: Ulcer Cleansing: Ulcer Cleansing: N/A Rinsed/Irrigated with Rinsed/Irrigated with Saline Saline Topical Anesthetic Topical Anesthetic Applied: Other: lidocaine Applied: Other: lidocaine 4% 4% Treatment Notes DECLAN, ADAMSON ([id number]) Electronic Signature(s) Signed: 05/20/2016 4:59:26 PM By: Curtis Sites Entered By: Curtis Sites on 05/20/2016 10:32:01 [name] ([id number]) -------------------------------------------------------------------------------- Multi-Disciplinary Care Plan Details Patient Name: [name] Date of Service: 05/20/2016 10:00 AM Medical Record Number: [id number] Patient Account Number: [id number] Date of Birth/Sex: [birth year][name] (82 y.o. Female) Treating RN: Curtis Sites Primary Care Physician: Dorothey Baseman Other Clinician: Referring Physician: Dorothey Baseman Treating Physician/Extender: Rudene Re in Treatment: 9 Active Inactive Wound/Skin Impairment Nursing Diagnoses: Impaired tissue integrity Goals: Patient/caregiver will verbalize understanding of skin care regimen Date Initiated: 05/20/2016 Goal Status: Active Ulcer/skin breakdown will have a volume reduction of 30% by week 4 Date Initiated: 05/20/2016 Goal Status: Active Ulcer/skin breakdown will have a volume reduction of 50% by week 8 Date Initiated: 05/20/2016 Goal Status: Active Ulcer/skin breakdown will have a volume  reduction of 80% by week 12 Date Initiated: 05/20/2016 Goal Status: Active Ulcer/skin breakdown will heal within 14 weeks Date Initiated: 05/20/2016 Goal Status: Active Interventions: Assess patient/caregiver ability to obtain necessary supplies Assess patient/caregiver ability to perform ulcer/skin care regimen upon admission and as needed Assess ulceration(s) every visit Notes: Electronic Signature(s) Signed: 05/20/2016 4:59:26 PM By: Curtis Sites Entered By: Curtis Sites on 05/20/2016 10:31:45 [name] ([id number]) -------------------------------------------------------------------------------- Pain Assessment Details Patient Name: [name] Date of Service: 05/20/2016 10:00 AM Medical Record Number: [id number] Patient Account Number: [id number] Date of Birth/Sex: [birth year] (82 y.o. Female) Treating RN: Curtis Sites Primary Care Physician: Dorothey Baseman Other Clinician: Referring Physician: Dorothey Baseman Treating Physician/Extender: Rudene Re in Treatment: 9 Active Problems Location of Pain Severity and Description of Pain Patient Has Paino Yes Site Locations Pain Location: Pain in Ulcers With Dressing Change: Yes Duration of the Pain. Constant / Intermittento Constant Pain Management and Medication Current Pain Management: Notes Topical or injectable lidocaine is offered to patient for acute pain when surgical debridement is performed. If needed, Patient is instructed  to use over the counter pain medication for the following 24-48 hours after debridement. Wound care MDs do not prescribed pain medications. Patient has chronic pain or uncontrolled pain. Patient has been instructed to make an appointment with their Primary Care Physician for pain management. Electronic Signature(s) Signed: 05/20/2016 4:59:26 PM By: Curtis Sites Entered By: Curtis Sites on 05/20/2016 10:09:09 [name]  ([id number]) -------------------------------------------------------------------------------- Patient/Caregiver Education Details Patient Name: [name] Date of Service: 05/20/2016 10:00 AM Medical Record Number: [id number] Patient Account Number: [id number] Date of Birth/Gender: [name], [name] (82 y.o. Female) Treating RN: Curtis Sites Primary Care Physician: Dorothey Baseman Other Clinician: Referring Physician: Dorothey Baseman Treating Physician/Extender: Rudene Re in Treatment: 9 Education Assessment Education Provided To: Patient and Caregiver Education Topics Provided Wound/Skin Impairment: Handouts: Other: wound carea s ordered Methods: Demonstration, Explain/Verbal Responses: State content correctly Electronic Signature(s) Signed: 05/20/2016 4:59:26 PM By: Curtis Sites Entered By: Curtis Sites on 05/20/2016 10:54:46 [name] ([id number]) -------------------------------------------------------------------------------- Wound Assessment Details Patient Name: [name] Date of Service: 05/20/2016 10:00 AM Medical Record Number: [id number] Patient Account Number: [id number] Date of Birth/Sex: [birth year][name] (82 y.o. Female) Treating RN: Curtis Sites Primary Care Physician: Dorothey Baseman Other Clinician: Referring Physician: Terance Hart, DAVID Treating Physician/Extender: Rudene Re in Treatment: 9 Wound Status Wound Number: 12 Primary Pressure Ulcer Etiology: Wound Location: Right Toe Second - Dorsal Wound Open Wounding Event: Pressure Injury Status: Date Acquired: 01/26/2015 Comorbid Arrhythmia, Congestive Heart Failure, Weeks Of Treatment: 9 History: Deep Vein Thrombosis, Peripheral Clustered Wound: No Arterial Disease, Type II Diabetes, Gout, Osteoarthritis Photos Wound Measurements Length: (cm) 0.3 Width: (cm) 0.5 Depth: (cm) 0.1 Area: (cm) 0.118 Volume: (cm) 0.012 % Reduction in Area: 69.4% % Reduction in Volume:  68.4% Epithelialization: Medium (34-66%) Tunneling: No Undermining: No Wound Description Classification: Category/Stage II Foul Odor A Diabetic Severity (Wagner): Grade 1 Wound Margin: Flat and Intact Exudate Amount: Medium Exudate Type: Serosanguineous Exudate Color: red, brown fter Cleansing: No Wound Bed Granulation Amount: Small (1-33%) Exposed Structure Granulation Quality: Pink Fascia Exposed: No Necrotic Amount: Large (67-100%) Fat Layer Exposed: No Kimes, [name] ([id number]) Necrotic Quality: Eschar Tendon Exposed: No Muscle Exposed: No Joint Exposed: No Bone Exposed: No Limited to Skin Breakdown Periwound Skin Texture Texture Color No Abnormalities Noted: No No Abnormalities Noted: No Callus: No Atrophie Blanche: No Crepitus: No Cyanosis: No Excoriation: No Ecchymosis: No Fluctuance: No Erythema: No Friable: No Hemosiderin Staining: Yes Induration: No Mottled: No Localized Edema: Yes Pallor: No Rash: No Rubor: No Scarring: No Temperature / Pain Moisture Temperature: No Abnormality No Abnormalities Noted: No Tenderness on Palpation: Yes Dry / Scaly: Yes Maceration: Yes Moist: Yes Wound Preparation Ulcer Cleansing: Rinsed/Irrigated with Saline Topical Anesthetic Applied: Other: lidocaine 4%, Treatment Notes Wound #12 (Right, Dorsal Toe Second) 1. Cleansed with: Clean wound with Normal Saline Cleanse wound with antibacterial soap and water 2. Anesthetic Topical Lidocaine 4% cream to wound bed prior to debridement 3. Peri-wound Care: Antifungal cream 4. Dressing Applied: Other dressing (specify in notes) 5. Secondary Dressing Applied ABD Pad 7. Secured with Tape Other (specify in notes) Notes tritec silver, kerlix and coban wrap Skaff, [name] ([id number]) Electronic Signature(s) Signed: 05/20/2016 4:59:26 PM By: Curtis Sites Entered By: Curtis Sites on 05/20/2016 10:[name]:07 [name]  ([id number]) -------------------------------------------------------------------------------- Wound Assessment Details Patient Name: [name] Date of Service: 05/20/2016 10:00 AM Medical Record Number: [id number] Patient Account Number: [id number] Date of Birth/Sex: [birth year][name] (82 y.o. Female) Treating RN: Curtis Sites Primary Care Physician: Dorothey Baseman Other Clinician: Referring Physician: Dorothey Baseman Treating Physician/Extender: Meyer Russel,  Errol Weeks in Treatment: 9 Wound Status Wound Number: 15 Primary Diabetic Wound/Ulcer of the Lower Etiology: Extremity Wound Location: Right Foot - Dorsal Wound Open Wounding Event: Gradually Appeared Status: Date Acquired: 04/15/2016 Comorbid Arrhythmia, Congestive Heart Failure, Weeks Of Treatment: 5 History: Deep Vein Thrombosis, Peripheral Clustered Wound: No Arterial Disease, Type II Diabetes, Gout, Osteoarthritis Photos Wound Measurements Length: (cm) 0.1 Width: (cm) 0.1 Depth: (cm) 0.1 Area: (cm) 0.008 Volume: (cm) 0.001 % Reduction in Area: 100% % Reduction in Volume: 100% Epithelialization: Large (67-100%) Tunneling: No Undermining: No Wound Description Classification: Grade 1 Foul Odor Aft Wound Margin: Flat and Intact Exudate Amount: Medium Exudate Type: Serosanguineous Exudate Color: red, brown er Cleansing: No Wound Bed Granulation Amount: None Present (0%) Exposed Structure Necrotic Amount: None Present (0%) Fascia Exposed: No Fat Layer Exposed: No Tendon Exposed: No Mcjunkins, [name] ([id number]) Muscle Exposed: No Joint Exposed: No Bone Exposed: No Limited to Skin Breakdown Periwound Skin Texture Texture Color No Abnormalities Noted: Yes No Abnormalities Noted: No Atrophie Blanche: No Moisture Cyanosis: No No Abnormalities Noted: No Ecchymosis: No Dry / Scaly: No Erythema: Yes Maceration: No Erythema Location: Circumferential Moist: No Hemosiderin Staining: Yes Mottled:  No Pallor: No Rubor: No Wound Preparation Ulcer Cleansing: Rinsed/Irrigated with Saline Topical Anesthetic Applied: Other: lidocaine 4%, Treatment Notes Wound #15 (Right, Dorsal Foot) 1. Cleansed with: Clean wound with Normal Saline Cleanse wound with antibacterial soap and water 2. Anesthetic Topical Lidocaine 4% cream to wound bed prior to debridement 3. Peri-wound Care: Antifungal cream 4. Dressing Applied: Other dressing (specify in notes) 5. Secondary Dressing Applied ABD Pad 7. Secured with Tape Other (specify in notes) Notes tritec silver, kerlix and coban wrap Electronic Signature(s) Signed: 05/20/2016 4:59:26 PM By: Curtis Sites Entered By: Curtis Sites on 05/20/2016 10:30:02 [name] ([id number]) -------------------------------------------------------------------------------- Vitals Details Patient Name: [name] Date of Service: 05/20/2016 10:00 AM Medical Record Number: [id number] Patient Account Number: [id number] Date of Birth/Sex: [birth year][name] (82 y.o. Female) Treating RN: Curtis Sites Primary Care Physician: Dorothey Baseman Other Clinician: Referring Physician: Terance Hart, DAVID Treating Physician/Extender: Rudene Re in Treatment: 9 Vital Signs Time Taken: 10:09 Temperature (F): 97.6 Pulse (bpm): 56 Respiratory Rate (breaths/min): 18 Blood Pressure (mmHg): 118/42 Reference Range: 80 - 120 mg / dl Electronic Signature(s) Signed: 05/20/2016 4:59:26 PM By: Curtis Sites Entered By: Curtis Sites on 05/20/2016 10:11:10

## 2016-06-03 ENCOUNTER — Encounter: Payer: Medicare Other | Attending: Surgery | Admitting: Surgery

## 2016-06-03 DIAGNOSIS — Z89512 Acquired absence of left leg below knee: Secondary | ICD-10-CM | POA: Diagnosis not present

## 2016-06-03 DIAGNOSIS — L97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: Secondary | ICD-10-CM | POA: Diagnosis not present

## 2016-06-03 DIAGNOSIS — L97812 Non-pressure chronic ulcer of other part of right lower leg with fat layer exposed: Secondary | ICD-10-CM | POA: Insufficient documentation

## 2016-06-03 DIAGNOSIS — I70235 Atherosclerosis of native arteries of right leg with ulceration of other part of foot: Secondary | ICD-10-CM | POA: Diagnosis not present

## 2016-06-03 DIAGNOSIS — E11622 Type 2 diabetes mellitus with other skin ulcer: Secondary | ICD-10-CM | POA: Insufficient documentation

## 2016-06-04 NOTE — Progress Notes (Signed)
NAIROBI, GUSTAFSON ([id number]) Visit Report for 06/03/2016 Chief Complaint Document Details Patient Name: [name], [name] Date of Service: 06/03/2016 10:00 AM Medical Record Number: [id number] Patient Account Number: [id number] Date of Birth/Sex: 29-Jul-1921 (82 y.o. Female) Treating RN: Curtis Sites Primary Care Physician: Dorothey Baseman Other Clinician: Referring Physician: Dorothey Baseman Treating Physician/Extender: Rudene Re in Treatment: 11 Information Obtained from: Patient Chief Complaint 82 year old patient who is very well-known to as returns after a recent admission to hospital and his prolonged stay in the nursing home. Electronic Signature(s) Signed: 06/03/2016 11:01:04 AM By: Evlyn Kanner MD, FACS Entered By: Evlyn Kanner on 06/03/2016 11:01:04 [name] ([id number]) -------------------------------------------------------------------------------- HPI Details Patient Name: [name] Date of Service: 06/03/2016 10:00 AM Medical Record Number: [id number] Patient Account Number: [id number] Date of Birth/Sex: 12/11/1920 (82 y.o. Female) Treating RN: Curtis Sites Primary Care Physician: Dorothey Baseman Other Clinician: Referring Physician: Dorothey Baseman Treating Physician/Extender: Rudene Re in Treatment: 11 History of Present Illness Location: right medial heel and right second toe Quality: Patient reports experiencing a dull pain to affected area(s). Severity: Patient states wound are getting worse. Duration: Patient has had the wound for > 6months prior to seeking treatment at the wound center Timing: Pain in wound is Intermittent (comes and goes Context: The wound appeared gradually over time Modifying Factors: Consults to this date include:vascular surgeon and several recent surgeries. HPI Description: A pleasant 82 year old patient who is very well-known to my practice was seen about a month and a half ago until she was admitted to  hospital and is known to have diabetes mellitus for several years has recently gone through a series of operations with the vascular surgeon Dr. Gilda Crease. In January and February she had several surgeries on the left lower extremity with an attempt to have limb salvage for gangrenous changes of her forefoot. 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 On 01/07/2015 she also had a right lower extremity distal runoff with a angioplasty of the right anterior tibial artery to maximize her blood flow to the foot. She recently had her staples removed at Dr. Marijean Heath office on 01/31/2015 and at that time a right lower extremity duplex was done which showed patent vessels and a patent stent with distal occluded posterior tibial artery. Though the duplex was noncritical the recommendations from the PA at the vascular surgery office was that of a angiogram to be done but the patient said she would rather try some bone care before. The patient has received doxycycline and Cipro in the recent past and she takes oral medications for her diabetes. Other than that I reviewed her list of all her medications. No recent hemoglobin A1c has been done and no recent x-rays of the right foot have been done. 02/18/2015 -- x-ray of the right foot was done on 02/11/2015 and it shows no evidence of acute osteomyelitis of the third toe or of the calcaneus. She had gone to the vascular surgery office and they had noted that there is dehiscence of the left part of the amputation site of the below-knee wound and they have asked [place] to kindly take over the care of this. There've been doing dressings for the right heel and right third toe. 02/25/2015 -- we have received notes from the vascular group who saw her last on 02/13/2015 and she was seen by the PA Ms. Cleda Daub. She had recommended that the patient continue to follow with [place] for wound  care including management of the left BKA stump, which has had dehiscence of the lateral part. They will consider repeating a arterial duplex or angiogram if the right lower extremity does not heal within a reasonable period of time. 03/18/2015 -- he saw the vascular surgeon Dr. Gilda Crease and he has set her up for an angioplasty sometime in the middle of July. she is doing well otherwise. 04/10/2015 -- the patient had a procedure done on 04/08/2015 and this was a right angioplasty of the Trageser, Susanna ([id number]) dorsalis pedis, anterior tibial artery, first superficial femoral artery in its midportion. There was successful intervention with recanalization and in-line flow to the right foot. 04/22/2015 -- the patient was looking rather pale today and the daughter confirms that her hemoglobin was down to 6.9. she is being monitored by her PCP. Her Lasix dose was increased and her edema has gone down significantly. 04/29/2015 -- she had vascular studies done and the ABI on the right is 1.3 and the toe pressures were within normal limits. Her hemoglobin is still around 7 and she refuses to take any blood transfusions for religious reasons. Her potassium was normal. 06/10/2015 -- she has a new blister on her right lateral and posterior part of her leg just in the region where the Kerlix bandages were applied and this may be due to an abrasion. 06/24/2015 -- On her right lower extremity she has developed several more blisters which look like small pustules and the drain informed shallow ulcerations. 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 07/01/2015 -- she has an appointment with the PCP this Friday and her vascular surgeon on Monday. Other than that she is on doxycycline and has finished 1 week of treatment. The pustules she had all over have now resolved. 07/08/2015 -- she saw the vascular surgeon who did studies in the office and found that there is poor circulation in the distal lower right leg and  has set her up for an angiogram and possible stenting next Tuesday. 07/22/2015 -- on October 18 she was taken up for a successful revascularization of the anterior tibial vessel on the right side. a PTA and stent placement was done to the right anterior tibial artery. 08/19/2015 -- she has broken out with some linear ulceration in the region of her webspaces of her toes and this is something new. 08/26/2015 -- over the last 3 days there was a streak of redness going from her lower extremity towards her toes but she had no fever or discharge from the wounds. 09/02/2015 -- the redness and cellulitis has gone down but she continues to have a lot of edema of the right lower extremity. 10/28/2015 -- over the last week she has been draining a lot of fluid from her right lower extremities and several of the wounds which had healed in the past and now opened up again 11/06/2015 -- she was seen in the vascular office and the right ABI was more than 1.3 which was consistent with noncompressible arteries due to medial calcification and the right great toe pressure was 0.24 and the PPG waveform showed significant decrease. from talking to the family member I believe no procedure has been planned. She was also seen by her PCP was increased her Lasix to 80 mg a day and has ordered some lab work but these reports are not back. 11/20/2015 -- Recent labs done hemoglobin A1c was 6.2, INR was .93, iron-binding capacity was 310, BMP was within normal limits except for the BUN which was  40 and albumin was 3.1. Her hemoglobin is up to 10.7 with a hematocrit of 34.2 he was advised lymphedema pumps by Dr. Gilda Crease and she has not been wearing these. Her daughter-in- law also says that she has been very noncompliant about elevation of her limbs. 12/18/2015 -- she was seen by Dr. Gilda Crease who agreed with the management and did not think she required any intervention. He discussed elevation and use of lymph pump and will  see her back as required. 01/15/2016 -- we have been seeing help me every other week and from what I understand from the family member she is quite stubborn and noncompliant and does not use any elevation of follow the advice saying Chittum, Deshannon ([id number]) most medications and applications are causing her pain. 03/12/2016 -- was admitted to the hospital on 02/03/2016 with a right lower extremity cellulitis. She was put on vancomycin and Azactam and metronidazole. She was seen by Dr. Wyn Quaker who performed a aortogram and selective right lower extremity angiogram with a angioplasty of the proximal right anterior tibial artery and right posterior tibial artery and peroneal artery. She was also seen by Dr. Sampson Goon of infectious disease who recommended changing over to ciprofloxacin 500 mg by mouth twice a day for 7 more days and doxycycline 100 mg by mouth twice a day for 7 more days. She had 9 days of IV antibiotics including vancomycin, aztreonam and Flagyl and the wound was improving. He recommended that she follow-up with me at the wound center and if her leg was worsening repeat cultures could be done and he would make appropriate changes to the antibiotics. She was discharged home on 02/13/2016 with oral ciprofloxacin and doxycycline. She was seen in follow-up on 02/27/2016 at the vascular office for a postop visit status post right lower extremity angiogram and the postprocedure course was found to be uneventful. He was told to come back in a month for an ABI and right lower extremity duplex study to assess her PAD. of note recent x-rays done on April 27 and May 17 did not show any osteomyelitis of the second toe x-ray of the right foot -- IMPRESSION:Findings compatible with cellulitis of the second toe. No objective evidence of osteomyelitis but there is severe diffuse osteopenia which limits sensitivity of the study. 04/15/2016 -- agent has now got dependent edema and has been oozing  fluid from the lower extremity and from the right foot. He is rather stubborn about not elevating her leg. 04/29/2016 -- she was seen in the vascular office and at that time had a significant cellulitis and was put on oral doxycycline for 2 weeks. This seems to have helped her immensely and she was also wrapped with an wound Unna's boots. 05/20/2016 -- she did very well with tri-tech Ultrafoam but ran out of this and used a different alginate and this kept a lot of moisture in the wound Electronic Signature(s) Signed: 06/03/2016 11:01:18 AM By: Evlyn Kanner MD, FACS Entered By: Evlyn Kanner on 06/03/2016 11:01:17 [name] ([id number]) -------------------------------------------------------------------------------- Physical Exam Details Patient Name: [name] Date of Service: 06/03/2016 10:00 AM Medical Record Number: [id number] Patient Account Number: [id number] Date of Birth/Sex: 1921-03-07 (82 y.o. Female) Treating RN: Curtis Sites Primary Care Physician: Dorothey Baseman Other Clinician: Referring Physician: Dorothey Baseman Treating Physician/Extender: Rudene Re in Treatment: 11 Constitutional . Pulse regular. Respirations normal and unlabored. Afebrile. . Eyes Nonicteric. Reactive to light. Ears, Nose, Mouth, and Throat Lips, teeth, and gums WNL.Marland Kitchen Moist mucosa without lesions. Neck supple  and nontender. No palpable supraclavicular or cervical adenopathy. Normal sized without goiter. Respiratory WNL. No retractions.. Cardiovascular Pedal Pulses WNL. No clubbing, cyanosis or edema. Lymphatic No adneopathy. No adenopathy. No adenopathy. Musculoskeletal Adexa without tenderness or enlargement.. Digits and nails w/o clubbing, cyanosis, infection, petechiae, ischemia, or inflammatory conditions.. Integumentary (Hair, Skin) No suspicious lesions. No crepitus or fluctuance. No peri-wound warmth or erythema. No masses.Marland Kitchen Psychiatric Judgement and insight  Intact.. No evidence of depression, anxiety, or agitation.. Notes right lower extremity is looking excellent today with no evidence of fungal infection and the moisture between her toes has gone down significantly. Except for a small area which is open on the right second toe on the dorsum everything else has healed. Electronic Signature(s) Signed: 06/03/2016 11:02:01 AM By: Evlyn Kanner MD, FACS Entered By: Evlyn Kanner on 06/03/2016 11:02:00 [name] ([id number]) -------------------------------------------------------------------------------- Physician Orders Details Patient Name: [name] Date of Service: 06/03/2016 10:00 AM Medical Record Number: [id number] Patient Account Number: [id number] Date of Birth/Sex: 01/05/1921 (82 y.o. Female) Treating RN: Clover Mealy, RN, BSN, [place] Primary Care Physician: Dorothey Baseman Other Clinician: Referring Physician: Terance Hart DAVID Treating Physician/Extender: Rudene Re in Treatment: 61 Verbal / Phone Orders: Yes Clinician: Afful, RN, BSN, Rita Read Back and Verified: Yes Diagnosis Coding Wound Cleansing Wound #12 Right,Dorsal Toe Second o Clean wound with Normal Saline. o Cleanse wound with mild soap and water o May Shower, gently pat wound dry prior to applying new dressing. Primary Wound Dressing Wound #12 Right,Dorsal Toe Second o Other: - Tritec Silver in between toes . If not able to obtain can use aquacel ag. Secondary Dressing Wound #12 Right,Dorsal Toe Second o Gauze and Kerlix/Conform Dressing Change Frequency Wound #12 Right,Dorsal Toe Second o Change dressing every other day. - or as needed for soiled or drainage Follow-up Appointments Wound #12 Right,Dorsal Toe Second o Return Appointment in: - 3 weeks - or sooner if needed Edema Control Wound #12 Right,Dorsal Toe Second o Other: - kerlix, coban from base of toes to three finger width below knee Additional Orders / Instructions Wound #12  Right,Dorsal Toe Second o Increase protein intake. o Activity as tolerated Electronic Signature(s) Signed: 06/03/2016 4:26:59 PM By: Evlyn Kanner MD, FACS Signed: 06/03/2016 5:31:48 PM By: Elpidio Eric BSN, RN [place], Elika ([id number]) Entered By: Elpidio Eric on 06/03/2016 10:55:09 [name] ([id number]) -------------------------------------------------------------------------------- Problem List Details Patient Name: [name] Date of Service: 06/03/2016 10:00 AM Medical Record Number: [id number] Patient Account Number: [id number] Date of Birth/Sex: April 13, 1921 (82 y.o. 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 03/12/2016 Yes fat layer exposed L97.812 Non-pressure chronic ulcer of other part of right lower leg 03/12/2016 Yes with fat layer exposed Inactive Problems Resolved Problems Electronic Signature(s) Signed: 06/03/2016 11:00:48 AM By: Evlyn Kanner MD, FACS Entered By: Evlyn Kanner on 06/03/2016 11:00:47 [name] ([id number]) -------------------------------------------------------------------------------- Progress Note Details Patient Name: [name] Date of Service: 06/03/2016 10:00 AM Medical Record Number: [id number] Patient Account Number: [id number] Date of Birth/Sex: [birth year] (82 y.o. Female) Treating RN: Curtis Sites Primary Care Physician: Dorothey Baseman Other Clinician: Referring Physician: Terance Hart  DAVID Treating Physician/Extender: Rudene Re in Treatment: 11 Subjective Chief Complaint Information obtained from Patient 82 year old patient who is very well-known to as returns after a recent admission to hospital and his prolonged stay in the nursing home. History of Present Illness (HPI) The following HPI elements were documented for the patient's wound: Location: right medial heel and right second toe Quality: Patient reports experiencing a dull pain to affected area(s). Severity: Patient states wound are getting worse. Duration: Patient has had the wound for > 6months prior to seeking treatment at the wound center Timing: Pain in wound is Intermittent (comes and goes Context: The wound appeared gradually over time Modifying Factors: Consults to this date include:vascular surgeon and several recent surgeries. A pleasant 82 year old patient who is very well-known to my practice was seen about a month and a half ago until she was admitted to hospital and is known to have diabetes mellitus for several years has recently gone through a series of operations with the vascular surgeon Dr. Gilda Crease. In January and February she had several surgeries on the left lower extremity with an attempt to have limb salvage for gangrenous changes of her forefoot. 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 On 01/07/2015 she also had a right lower extremity distal runoff with a angioplasty of the right anterior tibial artery to maximize her blood flow to the foot. She recently had her staples removed at Dr. Marijean Heath office on 01/31/2015 and at that time a right lower extremity duplex was done which showed patent vessels and a patent stent with distal occluded posterior tibial artery. Though the duplex was noncritical the recommendations from the PA at the vascular surgery office was that of a angiogram to be done but  the patient said she would rather try some bone care before. The patient has received doxycycline and Cipro in the recent past and she takes oral medications for her diabetes. Other than that I reviewed her list of all her medications. No recent hemoglobin A1c has been done and no recent x-rays of the right foot have been done. 02/18/2015 -- x-ray of the right foot was done on 02/11/2015 and it shows no evidence of acute osteomyelitis of the third toe or of the calcaneus. She had gone to the vascular surgery office and they had noted that there is dehiscence of the left part of the amputation site of the below-knee wound and they have asked [place] to kindly take over the care of this. There've been doing dressings for the right heel and right third toe. 02/25/2015 -- we have received notes from the vascular group who saw her last on 02/13/2015 and she Lumbra, Ellis ([id number]) was seen by the PA Ms. Cleda Daub. She had recommended that the patient continue to follow with [place] for wound care including management of the left BKA stump, which has had dehiscence of the lateral part. They will consider repeating a arterial duplex or angiogram if the right lower extremity does not heal within a reasonable period of time. 03/18/2015 -- he saw the vascular surgeon Dr. Gilda Crease and he has set her up for an angioplasty sometime in the middle of July. she is doing well otherwise. 04/10/2015 -- the patient had a procedure done on 04/08/2015 and this was a right angioplasty of the dorsalis pedis, anterior tibial artery, first superficial femoral artery in its midportion. There was successful intervention with recanalization and in-line flow to the  right foot. 04/22/2015 -- the patient was looking rather pale today and the daughter confirms that her hemoglobin was down to 6.9. she is being monitored by her PCP. Her Lasix dose was increased and her edema has gone down significantly. 04/29/2015 -- she had  vascular studies done and the ABI on the right is 1.3 and the toe pressures were within normal limits. Her hemoglobin is still around 7 and she refuses to take any blood transfusions for religious reasons. Her potassium was normal. 06/10/2015 -- she has a new blister on her right lateral and posterior part of her leg just in the region where the Kerlix bandages were applied and this may be due to an abrasion. 06/24/2015 -- On her right lower extremity she has developed several more blisters which look like small pustules and the drain informed shallow ulcerations. 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 07/01/2015 -- she has an appointment with the PCP this Friday and her vascular surgeon on Monday. Other than that she is on doxycycline and has finished 1 week of treatment. The pustules she had all over have now resolved. 07/08/2015 -- she saw the vascular surgeon who did studies in the office and found that there is poor circulation in the distal lower right leg and has set her up for an angiogram and possible stenting next Tuesday. 07/22/2015 -- on October 18 she was taken up for a successful revascularization of the anterior tibial vessel on the right side. a PTA and stent placement was done to the right anterior tibial artery. 08/19/2015 -- she has broken out with some linear ulceration in the region of her webspaces of her toes and this is something new. 08/26/2015 -- over the last 3 days there was a streak of redness going from her lower extremity towards her toes but she had no fever or discharge from the wounds. 09/02/2015 -- the redness and cellulitis has gone down but she continues to have a lot of edema of the right lower extremity. 10/28/2015 -- over the last week she has been draining a lot of fluid from her right lower extremities and several of the wounds which had healed in the past and now opened up again 11/06/2015 -- she was seen in the vascular office and the right ABI was more  than 1.3 which was consistent with noncompressible arteries due to medial calcification and the right great toe pressure was 0.24 and the PPG waveform showed significant decrease. from talking to the family member I believe no procedure has been planned. She was also seen by her PCP was increased her Lasix to 80 mg a day and has ordered some lab work but these reports are not back. 11/20/2015 -- Recent labs done hemoglobin A1c was 6.2, INR was .93, iron-binding capacity was 310, BMP was within normal limits except for the BUN which was 40 and albumin was 3.1. Her [facility] ([id number]) 10.7 with a hematocrit of 34.2 he was advised lymphedema pumps by Dr. Gilda CreaseSchnier and she has not been wearing these. Her daughter-in- law also says that she has been very noncompliant about elevation of her limbs. 12/18/2015 -- she was seen by Dr. Gilda CreaseSchnier who agreed with the management and did not think she required any intervention. He discussed elevation and use of lymph pump and will see her back as required. 01/15/2016 -- we have been seeing help me every other week and from what I understand from the family member she is quite stubborn and  noncompliant and does not use any elevation of follow the advice saying most medications and applications are causing her pain. 03/12/2016 -- was admitted to the hospital on 02/03/2016 with a right lower extremity cellulitis. She was put on vancomycin and Azactam and metronidazole. She was seen by Dr. Wyn Quaker who performed a aortogram and selective right lower extremity angiogram with a angioplasty of the proximal right anterior tibial artery and right posterior tibial artery and peroneal artery. She was also seen by Dr. Sampson Goon of infectious disease who recommended changing over to ciprofloxacin 500 mg by mouth twice a day for 7 more days and doxycycline 100 mg by mouth twice a day for 7 more days. She had 9 days of IV antibiotics including  vancomycin, aztreonam and Flagyl and the wound was improving. He recommended that she follow-up with me at the wound center and if her leg was worsening repeat cultures could be done and he would make appropriate changes to the antibiotics. She was discharged home on 02/13/2016 with oral ciprofloxacin and doxycycline. She was seen in follow-up on 02/27/2016 at the vascular office for a postop visit status post right lower extremity angiogram and the postprocedure course was found to be uneventful. He was told to come back in a month for an ABI and right lower extremity duplex study to assess her PAD. of note recent x-rays done on April 27 and May 17 did not show any osteomyelitis of the second toe x-ray of the right foot -- IMPRESSION:Findings compatible with cellulitis of the second toe. No objective evidence of osteomyelitis but there is severe diffuse osteopenia which limits sensitivity of the study. 04/15/2016 -- agent has now got dependent edema and has been oozing fluid from the lower extremity and from the right foot. He is rather stubborn about not elevating her leg. 04/29/2016 -- she was seen in the vascular office and at that time had a significant cellulitis and was put on oral doxycycline for 2 weeks. This seems to have helped her immensely and she was also wrapped with an wound Unna's boots. 05/20/2016 -- she did very well with tri-tech Ultrafoam but ran out of this and used a different alginate and this kept a lot of moisture in the wound Objective Constitutional Pulse regular. Respirations normal and unlabored. Afebrile. Vitals Time Taken: 10:35 AM, Temperature: 97.9 F, Pulse: 45 bpm, Respiratory Rate: 16 breaths/min, Blood Pressure: 112/40 mmHg. Ouzts, Jaydynn ([id number]) Eyes Nonicteric. Reactive to light. Ears, Nose, Mouth, and Throat Lips, teeth, and gums WNL.Marland Kitchen Moist mucosa without lesions. Neck supple and nontender. No palpable supraclavicular or cervical  adenopathy. Normal sized without goiter. Respiratory WNL. No retractions.. Cardiovascular Pedal Pulses WNL. No clubbing, cyanosis or edema. Lymphatic No adneopathy. No adenopathy. No adenopathy. Musculoskeletal Adexa without tenderness or enlargement.. Digits and nails w/o clubbing, cyanosis, infection, petechiae, ischemia, or inflammatory conditions.Marland Kitchen Psychiatric Judgement and insight Intact.. No evidence of depression, anxiety, or agitation.. General Notes: right lower extremity is looking excellent today with no evidence of fungal infection and the moisture between her toes has gone down significantly. Except for a small area which is open on the right second toe on the dorsum everything else has healed. Integumentary (Hair, Skin) No suspicious lesions. No crepitus or fluctuance. No peri-wound warmth or erythema. No masses.. Wound #12 status is Open. Original cause of wound was Pressure Injury. The wound is located on the Right,Dorsal Toe Second. The wound measures 0.2cm length x 0.4cm width x 0.1cm depth; 0.063cm^2 area and 0.006cm^3 volume. The wound  is limited to skin breakdown. There is no tunneling or undermining noted. There is a medium amount of serosanguineous drainage noted. The wound margin is flat and intact. There is large (67-100%) pink granulation within the wound bed. There is no necrotic tissue within the wound bed. The periwound skin appearance exhibited: Localized Edema, Dry/Scaly, Maceration, Moist, Hemosiderin Staining. The periwound skin appearance did not exhibit: Callus, Crepitus, Excoriation, Fluctuance, Friable, Induration, Rash, Scarring, Atrophie Blanche, Cyanosis, Ecchymosis, Mottled, Pallor, Rubor, Erythema. Periwound temperature was noted as No Abnormality. The periwound has tenderness on palpation. Wound #15 status is Healed - Epithelialized. Original cause of wound was Gradually Appeared. The wound is located on the Right,Dorsal Foot. The wound measures  0cm length x 0cm width x 0cm depth; 0cm^2 area and 0cm^3 volume. The wound is limited to skin breakdown. There is no tunneling or undermining noted. There is a none present amount of drainage noted. The wound margin is flat and intact. There is no granulation within the wound bed. There is no necrotic tissue within the wound bed. The periwound skin appearance had no abnormalities noted for texture. The periwound skin appearance exhibited: Dry/Scaly. The periwound skin appearance did not exhibit: Maceration, Moist, Atrophie Blanche, Cyanosis, Ahlquist, Lenae ([id number]) Ecchymosis, Hemosiderin Staining, Mottled, Pallor, Rubor, Erythema. Periwound temperature was noted as No Abnormality. 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 Cleanse wound with mild soap and water May Shower, gently pat wound dry prior to applying new dressing. Primary Wound Dressing: Wound #12 Right,Dorsal Toe Second: Other: - Tritec Silver in between toes . If not able to obtain can use aquacel ag. Secondary Dressing: Wound #12 Right,Dorsal Toe Second: Gauze and Kerlix/Conform Dressing Change Frequency: Wound #12 Right,Dorsal Toe Second: Change dressing every other day. - or as needed for soiled or drainage Follow-up Appointments: Wound #12 Right,Dorsal Toe Second: Return Appointment in: - 3 weeks - or sooner if needed Edema Control: Wound #12 Right,Dorsal Toe Second: Other: - kerlix, coban from base of toes to three finger width below knee Additional  Orders / Instructions: Wound #12 Right,Dorsal Toe Second: Increase protein intake. Activity as tolerated Horton, Shaneya ([id number]) I have recommended: 1. stop the Lotrisone ointment. 2. Tritec Ultra foam to be applied daily between her toes so as to absorb the moisture. 3. She will see is back next two week if the problem resolve we can discharge her from the wound care services. 4. Elevation and exercise has also been emphasized. 5. Mild compression with Kerlix and Coban Electronic Signature(s) Signed: 06/03/2016 11:04:35 AM By: Evlyn Kanner MD, FACS Entered By: Evlyn Kanner on 06/03/2016 11:04:35 [name] ([id number]) -------------------------------------------------------------------------------- SuperBill Details Patient Name: [name] Date of Service: 06/03/2016 Medical Record Number: [id number] Patient Account Number: [id number] Date of Birth/Sex: Jan 14, 1921 (82 y.o. 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 L97.812 Non-pressure chronic ulcer of other part of right lower leg with fat layer exposed Facility Procedures CPT4 Code: [id number] Description: [phone] - WOUND CARE VISIT-LEV 2 EST PT Modifier: Quantity: 1 Physician Procedures CPT4: Description Modifier Quantity Code [id number] - WC PHYS LEVEL 3 - EST PT 1 ICD-10 Description Diagnosis E11.622 Type 2 diabetes mellitus with other skin ulcer I70.235 Atherosclerosis of native arteries of right leg with ulceration of other part  of  foot 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

## 2016-06-04 NOTE — Progress Notes (Signed)
EDRIE, EHRICH ([id number]) Visit Report for 06/03/2016 Arrival Information Details Patient Name: [name] Date of Service: 06/03/2016 10:00 AM Medical Record Number: [id number] Patient Account Number: [id number] Date of Birth/Sex: [birth year] (82 y.o. Female) Treating RN: [name] Primary Care Physician: [name] Other Clinician: Referring Physician: Terance Hart, [name] Treating Physician/Extender: [name] in Treatment: 11 Visit Information History Since Last Visit Added or deleted any medications: No Patient Arrived: Wheel Chair Any new allergies or adverse reactions: No Arrival Time: 10:29 Had a fall or experienced change in No Accompanied By: dtr in law activities of daily living that may affect Transfer Assistance: Manual risk of falls: Patient Identification Verified: Yes Signs or symptoms of abuse/neglect since last No Secondary Verification Process Yes visito Completed: Hospitalized since last visit: No Patient Requires Transmission- No Pain Present Now: Yes Based Precautions: Patient Has Alerts: Yes Patient Alerts: Patient on Blood Thinner DM II Eliquis Electronic Signature(s) Signed: 06/03/2016 5:53:50 PM By: [name] Entered By: [name] on 06/03/2016 10:29:37 [name] ([id number]) -------------------------------------------------------------------------------- Clinic Level of Care Assessment Details Patient Name: [name] Date of Service: 06/03/2016 10:00 AM Medical Record Number: [id number] Patient Account Number: [id number] Date of Birth/Sex: [birth year] (82 y.o. Female) Treating RN: [name] Primary Care Physician: [name] Other Clinician: Referring Physician: Terance Hart, [name] Treating Physician/Extender: [name] in Treatment: 11 Clinic Level of Care Assessment Items TOOL 4 Quantity Score []  - Use when only an EandM is performed on FOLLOW-UP visit 0 ASSESSMENTS - Nursing  Assessment / Reassessment X - Reassessment of Co-morbidities (includes updates in patient status) 1 10 X - Reassessment of Adherence to Treatment Plan 1 5 ASSESSMENTS - Wound and Skin Assessment / Reassessment X - Simple Wound Assessment / Reassessment - one wound 1 5 []  - Complex Wound Assessment / Reassessment - multiple wounds 0 []  - Dermatologic / Skin Assessment (not related to wound area) 0 ASSESSMENTS - Focused Assessment []  - Circumferential Edema Measurements - multi extremities 0 []  - Nutritional Assessment / Counseling / Intervention 0 X - Lower Extremity Assessment (monofilament, tuning fork, pulses) 1 5 []  - Peripheral Arterial Disease Assessment (using hand held doppler) 0 ASSESSMENTS - Ostomy and/or Continence Assessment and Care []  - Incontinence Assessment and Management 0 []  - Ostomy Care Assessment and Management (repouching, etc.) 0 PROCESS - Coordination of Care X - Simple Patient / Family Education for ongoing care 1 15 []  - Complex (extensive) Patient / Family Education for ongoing care 0 []  - Staff obtains Chiropractor, Records, Test Results / Process Orders 0 []  - Staff telephones HHA, Nursing Homes / Clarify orders / etc 0 []  - Routine Transfer to another Facility (non-emergent condition) 0 [name] ([id number]) []  - Routine Hospital Admission (non-emergent condition) 0 []  - New Admissions / Manufacturing engineer / Ordering NPWT, Apligraf, etc. 0 []  - Emergency Hospital Admission (emergent condition) 0 []  - Simple Discharge Coordination 0 []  - Complex (extensive) Discharge Coordination 0 PROCESS - Special Needs []  - Pediatric / Minor Patient Management 0 []  - Isolation Patient Management 0 []  - Hearing / Language / Visual special needs 0 []  - Assessment of Community assistance (transportation, D/C planning, etc.) 0 []  - Additional assistance / Altered mentation 0 []  - Support Surface(s) Assessment (bed, cushion, seat, etc.) 0 INTERVENTIONS - Wound  Cleansing / Measurement X - Simple Wound Cleansing - one wound 1 5 []  - Complex Wound Cleansing - multiple wounds 0 X - Wound Imaging (photographs - any number of wounds) 1 5 []  -  Wound Tracing (instead of photographs) 0 X - Simple Wound Measurement - one wound 1 5 []  - Complex Wound Measurement - multiple wounds 0 INTERVENTIONS - Wound Dressings X - Small Wound Dressing one or multiple wounds 1 10 []  - Medium Wound Dressing one or multiple wounds 0 []  - Large Wound Dressing one or multiple wounds 0 []  - Application of Medications - topical 0 []  - Application of Medications - injection 0 INTERVENTIONS - Miscellaneous []  - External ear exam 0 [name] ([id number]) []  - Specimen Collection (cultures, biopsies, blood, body fluids, etc.) 0 []  - Specimen(s) / Culture(s) sent or taken to Lab for analysis 0 []  - Patient Transfer (multiple staff / Michiel [name] Lift / Similar devices) 0 []  - Simple Staple / Suture removal (25 or less) 0 []  - Complex Staple / Suture removal (26 or more) 0 []  - Hypo / Hyperglycemic Management (close monitor of Blood Glucose) 0 []  - Ankle / Brachial Index (ABI) - do not check if billed separately 0 X - Vital Signs 1 5 Has the patient been seen at the hospital within the last three years: Yes Total Score: 70 Level Of Care: New/Established - Level 2 Electronic Signature(s) Signed: 06/03/2016 6:01:46 PM By: [name] BSN, RN Entered By: [name] on 06/03/2016 17:38:23 [name] ([id number]) -------------------------------------------------------------------------------- Encounter Discharge Information Details Patient Name: [name] Date of Service: 06/03/2016 10:00 AM Medical Record Number: [id number] Patient Account Number: [id number] Date of Birth/Sex: 03-10-1921 (82 y.o. Female) Treating RN: [name] Primary Care Physician: [name] Other Clinician: Referring Physician: Dorothey [name] Treating Physician/Extender: [name] in Treatment: 11 Encounter Discharge Information Items Discharge Pain Level: 0 Discharge Condition: Stable Ambulatory Status: Wheelchair Discharge Destination: Home Transportation: Private Auto Accompanied By: dtr in law Schedule Follow-up Appointment: Yes Medication Reconciliation completed No and provided to Patient/Care [place] Zou: Provided on Clinical Summary of Care: 06/03/2016 Form Type Recipient Paper Patient HS Electronic Signature(s) Signed: 06/03/2016 11:04:46 AM By: Gwenlyn Perking Entered By: Gwenlyn Perking on 06/03/2016 11:04:46 [name] ([id number]) -------------------------------------------------------------------------------- Lower Extremity Assessment Details Patient Name: [name] Date of Service: 06/03/2016 10:00 AM Medical Record Number: [id number] Patient Account Number: [id number] Date of Birth/Sex: 07-19-1921 (82 y.o. Female) Treating RN: [name] Primary Care Physician: [name] Other Clinician: Referring Physician: Dorothey [name] Treating Physician/Extender: [name] in Treatment: 11 Vascular Assessment Pulses: Posterior Tibial Dorsalis Pedis Palpable: [Right:Yes] Extremity colors, hair growth, and conditions: Extremity Color: [Right:Hyperpigmented] Hair Growth on Extremity: [Right:Yes] Temperature of Extremity: [Right:Warm] Capillary Refill: [Right:< 3 seconds] Electronic Signature(s) Signed: 06/03/2016 5:53:50 PM By: [name] Entered By: [name] on 06/03/2016 10:41:46 [name] ([id number]) -------------------------------------------------------------------------------- Multi Wound Chart Details Patient Name: [name] Date of Service: 06/03/2016 10:00 AM Medical Record Number: [id number] Patient Account Number: [id number] Date of Birth/Sex: [birth year] (82 y.o. 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 Comorbid History: Arrhythmia, Congestive Arrhythmia, Congestive N/A Heart Failure, Deep Vein Heart Failure, Deep Vein Thrombosis, Peripheral Thrombosis, Peripheral Arterial Disease, Type II Arterial Disease, Type II Diabetes, Gout, Diabetes, Gout, Osteoarthritis Osteoarthritis Date Acquired: 01/26/2015 04/15/2016 N/A Weeks of  Treatment: 11 7 N/A Wound Status: Open Healed - Epithelialized N/A Measurements L x W x D 0.2x0.4x0.1 0x0x0 N/A (cm) Area (cm) : 0.063 0 N/A Volume (cm) : 0.006 0 N/A % Reduction in Area: 83.60% 100.00% N/A % Reduction in Volume: 84.20% 100.00% N/A Classification: Category/Stage II Grade 1 N/A HBO Classification: Grade 1 N/A N/A Exudate Amount: Medium None Present N/A Exudate Type: Serosanguineous N/A N/A Foss, [name] ([id number]) Exudate Color: red, brown N/A N/A Wound Margin: Flat and Intact Flat and Intact N/A Granulation Amount: Large (67-100%) None Present (0%) N/A Granulation Quality: Pink N/A N/A Necrotic Amount: None Present (0%) None Present (0%) N/A Exposed Structures: Fascia: No Fascia: No N/A Fat: No Fat: No Tendon: No Tendon: No Muscle: No Muscle: No Joint: No Joint: No Bone: No Bone: No Limited to Skin Limited to Skin Breakdown Breakdown Epithelialization: Medium (34-66%) Large (67-100%) N/A Periwound Skin Texture: Edema: Yes Edema: No N/A Excoriation: No Excoriation: No Induration: No Induration: No Callus: No Callus: No Crepitus: No Crepitus:  No Fluctuance: No Fluctuance: No Friable: No Friable: No Rash: No Rash: No Scarring: No Scarring: No Periwound Skin Maceration: Yes Dry/Scaly: Yes N/A Moisture: Moist: Yes Maceration: No Dry/Scaly: Yes Moist: No Periwound Skin Color: Hemosiderin Staining: Yes Atrophie Blanche: No N/A Atrophie Blanche: No Cyanosis: No Cyanosis: No Ecchymosis: No Ecchymosis: No Erythema: No Erythema: No Hemosiderin Staining: No Mottled: No Mottled: No Pallor: No Pallor: No Rubor: No Rubor: No Temperature: No Abnormality No Abnormality N/A Tenderness on Yes No N/A Palpation: Wound Preparation: Ulcer Cleansing: Ulcer Cleansing: N/A Rinsed/Irrigated with Rinsed/Irrigated with Saline Saline Topical Anesthetic Topical Anesthetic Applied: Other: lidocaine Applied: None 4% Treatment Notes Electronic Signature(s) Signed: 06/03/2016 5:31:48 PM By: [name] BSN, RN [place], Erik ([id number]) Entered By: [name] on 06/03/2016 10:52:49 [name] ([id number]) -------------------------------------------------------------------------------- Multi-Disciplinary Care Plan Details Patient Name: [name] Date of Service: 06/03/2016 10:00 AM Medical Record Number: [id number] Patient Account Number: [id number] Date of Birth/Sex: 20-Feb-1921 (82 y.o. Female) Treating RN: Afful, RN, BSN, [place] [name] Primary Care Physician: [name] Other Clinician: Referring Physician: Terance Hart, [name] Treating Physician/Extender: [name] in Treatment: 11 Active Inactive Wound/Skin Impairment Nursing Diagnoses: Impaired tissue integrity Goals: Patient/caregiver will verbalize understanding of skin care regimen Date Initiated: 05/20/2016 Goal Status: Active Ulcer/skin breakdown will have a volume reduction of 30% by week 4 Date Initiated: 05/20/2016 Goal Status: Active Ulcer/skin breakdown will have a volume reduction of 50% by week 8 Date Initiated: 05/20/2016 Goal Status:  Active Ulcer/skin breakdown will have a volume reduction of 80% by week 12 Date Initiated: 05/20/2016 Goal Status: Active Ulcer/skin breakdown will heal within 14 weeks Date Initiated: 05/20/2016 Goal Status: Active Interventions: Assess patient/caregiver ability to obtain necessary supplies Assess patient/caregiver ability to perform ulcer/skin care regimen upon admission and as needed Assess ulceration(s) every visit Notes: Electronic Signature(s) Signed: 06/03/2016 5:31:48 PM By: [name] BSN, RN Entered By: [name] on 06/03/2016 10:52:33 [name] ([id number]) -------------------------------------------------------------------------------- Pain Assessment Details Patient Name: [name] Date of Service: 06/03/2016 10:00 AM Medical Record Number: [id number] Patient Account Number: [id number] Date of Birth/Sex: 1921/06/14 (82 y.o. Female) Treating RN: [name] Primary Care Physician: [name] Other Clinician: Referring Physician: Dorothey [name] Treating Physician/Extender: [name] in Treatment: 11 Active Problems Location of Pain Severity and Description of Pain Patient Has Paino Yes Site Locations Pain Location: Pain in Ulcers With Dressing Change: Yes Duration of the Pain. Constant / Intermittento Constant Pain Management and Medication Current Pain Management: Notes Topical or injectable lidocaine  is offered to patient for acute pain when surgical debridement is performed. If needed, Patient is instructed to use over the counter pain medication for the following 24-48 hours after debridement. Wound care MDs do not prescribed pain medications. Patient has chronic pain or uncontrolled pain. Patient has been instructed to make an appointment with their Primary Care Physician for pain management. Electronic Signature(s) Signed: 06/03/2016 5:53:50 PM By: [name] Sitesorthy, Joanna Entered By: [name] Sitesorthy, Joanna on 06/03/2016 10:30:06 [name] NationsSILETZKY, Jalesia  ([id number]) -------------------------------------------------------------------------------- Patient/Caregiver Education Details Patient Name: [name] NationsSILETZKY, Perrin Date of Service: 06/03/2016 10:00 AM Medical Record Number: [id number] Patient Account Number: [id number] Date of Birth/Gender: 12/04/1920 (82 y.o. Female) Treating RN: [name] Sitesorthy, Joanna Primary Care Physician: [name] BasemanBRONSTEIN, [name] Other Clinician: Referring Physician: Dorothey BasemanBRONSTEIN, [name] Treating Physician/Extender: [name] ReBritto, Errol Weeks in Treatment: 11 Education Assessment Education Provided To: Patient and Caregiver Education Topics Provided Venous: Handouts: Other: continue leg elevation Methods: Explain/Verbal Responses: State content correctly Electronic Signature(s) Signed: 06/03/2016 5:53:50 PM By: [name] Sitesorthy, Joanna Entered By: [name] Sitesorthy, Joanna on 06/03/2016 11:04:39 [name] NationsSILETZKY, Audrina ([id number]) -------------------------------------------------------------------------------- Wound Assessment Details Patient Name: [name] NationsSILETZKY, Harmoney Date of Service: 06/03/2016 10:00 AM Medical Record Number: [id number] Patient Account Number: [id number] Date of Birth/Sex: 04/10/1921 (82 y.o. Female) Treating RN: [name] Sitesorthy, Joanna Primary Care Physician: [name] BasemanBRONSTEIN, [name] Other Clinician: Referring Physician: Terance HartBRONSTEIN, [name] Treating Physician/Extender: [name] ReBritto, Errol Weeks in Treatment: 11 Wound Status Wound Number: 12 Primary Pressure Ulcer Etiology: Wound Location: Right Toe Second - Dorsal Wound Open Wounding Event: Pressure Injury Status: Date Acquired: 01/26/2015 Comorbid Arrhythmia, Congestive Heart Failure, Weeks Of Treatment: 11 History: Deep Vein Thrombosis, Peripheral Clustered Wound: No Arterial Disease, Type II Diabetes, Gout, Osteoarthritis Photos Wound Measurements Length: (cm) 0.2 Width: (cm) 0.4 Depth: (cm) 0.1 Area: (cm) 0.063 Volume: (cm) 0.006 % Reduction in Area: 83.6% % Reduction in Volume: 84.2% Epithelialization: Medium  (34-66%) Tunneling: No Undermining: No Wound Description Classification: Category/Stage II Foul Odor Diabetic Severity (Wagner): Grade 1 Wound Margin: Flat and Intact Exudate Amount: Medium Exudate Type: Serosanguineous Exudate Color: red, brown After Cleansing: No Wound Bed Granulation Amount: Large (67-100%) Exposed Structure Granulation Quality: Pink Fascia Exposed: No Necrotic Amount: None Present (0%) Fat Layer Exposed: No Heigl, Reizel ([id number]) Tendon Exposed: No Muscle Exposed: No Joint Exposed: No Bone Exposed: No Limited to Skin Breakdown Periwound Skin Texture Texture Color No Abnormalities Noted: No No Abnormalities Noted: No Callus: No Atrophie Blanche: No Crepitus: No Cyanosis: No Excoriation: No Ecchymosis: No Fluctuance: No Erythema: No Friable: No Hemosiderin Staining: Yes Induration: No Mottled: No Localized Edema: Yes Pallor: No Rash: No Rubor: No Scarring: No Temperature / Pain Moisture Temperature: No Abnormality No Abnormalities Noted: No Tenderness on Palpation: Yes Dry / Scaly: Yes Maceration: Yes Moist: Yes Wound Preparation Ulcer Cleansing: Rinsed/Irrigated with Saline Topical Anesthetic Applied: Other: lidocaine 4%, Treatment Notes Wound #12 (Right, Dorsal Toe Second) 1. Cleansed with: Clean wound with Normal Saline 2. Anesthetic Topical Lidocaine 4% cream to wound bed prior to debridement 4. Dressing Applied: Other dressing (specify in notes) 5. Secondary Dressing Applied ABD and Kerlix/Conform Notes tritec silver, kerlix and coban wrap Electronic Signature(s) Signed: 06/03/2016 5:53:50 PM By: [name] Sitesorthy, Joanna Entered By: [name] Sitesorthy, Joanna on 06/03/2016 10:43:07 [name] NationsSILETZKY, Stephine ([id number]) -------------------------------------------------------------------------------- Wound Assessment Details Patient Name: [name] NationsSILETZKY, Aaira Date of Service: 06/03/2016 10:00 AM Medical Record Number: [id number] Patient Account Number:  [id number] Date of Birth/Sex: 08/05/1921 (82 y.o. Female) Treating RN: Clover MealyAfful, RN, BSN, [place] Primary Care Physician: [name] BasemanBRONSTEIN, [name] Other Clinician: Referring Physician: Terance HartBRONSTEIN, [name] Treating Physician/Extender: [name] ReBritto, Errol Weeks in Treatment: 11 Wound Status Wound Number:  15 Primary Diabetic Wound/Ulcer of the Lower Etiology: Extremity Wound Location: Right Foot - Dorsal Wound Healed - Epithelialized Wounding Event: Gradually Appeared Status: Date Acquired: 04/15/2016 Comorbid Arrhythmia, Congestive Heart Failure, Weeks Of Treatment: 7 History: Deep Vein Thrombosis, Peripheral Clustered Wound: No Arterial Disease, Type II Diabetes, Gout, Osteoarthritis Photos Wound Measurements Length: (cm) 0 % Reduction i Width: (cm) 0 % Reduction i Depth: (cm) 0 Epithelializa Area: (cm) 0 Tunneling: Volume: (cm) 0 Undermining: n Area: 100% n Volume: 100% tion: Large (67-100%) No No Wound Description Classification: Grade 1 Foul Odor Aft Wound Margin: Flat and Intact Exudate Amount: None Present er Cleansing: No Wound Bed Granulation Amount: None Present (0%) Exposed Structure Necrotic Amount: None Present (0%) Fascia Exposed: No Fat Layer Exposed: No Tendon Exposed: No Muscle Exposed: No Joint Exposed: No Nicodemus, Avanna ([id number]) Bone Exposed: No Limited to Skin Breakdown Periwound Skin Texture Texture Color No Abnormalities Noted: Yes No Abnormalities Noted: No Atrophie Blanche: No Moisture Cyanosis: No No Abnormalities Noted: No Ecchymosis: No Dry / Scaly: Yes Erythema: No Maceration: No Hemosiderin Staining: No Moist: No Mottled: No Pallor: No Rubor: No Temperature / Pain Temperature: No Abnormality Wound Preparation Ulcer Cleansing: Rinsed/Irrigated with Saline Topical Anesthetic Applied: None Electronic Signature(s) Signed: 06/03/2016 5:31:48 PM By: [name] BSN, RN Entered By: [name] on 06/03/2016 10:52:24 [name]  ([id number]) -------------------------------------------------------------------------------- Vitals Details Patient Name: [name] Date of Service: 06/03/2016 10:00 AM Medical Record Number: [id number] Patient Account Number: [id number] Date of Birth/Sex: Mar 20, 1921 (82 y.o. Female) Treating RN: [name] Primary Care Physician: [name] Other Clinician: Referring Physician: Terance Hart, [name] Treating Physician/Extender: [name] in Treatment: 11 Vital Signs Time Taken: 10:35 Temperature (F): 97.9 Pulse (bpm): 45 Respiratory Rate (breaths/min): 16 Blood Pressure (mmHg): 112/40 Reference Range: 80 - 120 mg / dl Electronic Signature(s) Signed: 06/03/2016 5:53:50 PM By: [name] Entered By: [name] on 06/03/2016 10:35:46

## 2016-06-24 ENCOUNTER — Encounter: Payer: Medicare Other | Admitting: Surgery

## 2016-06-24 DIAGNOSIS — E11622 Type 2 diabetes mellitus with other skin ulcer: Secondary | ICD-10-CM | POA: Diagnosis not present

## 2016-06-25 NOTE — Progress Notes (Signed)
[name], [name] ([id number]) Visit Report for 06/24/2016 Chief Complaint Document Details Patient Name: [name] Date of Service: 06/24/2016 10:45 AM Medical Record Number: [id number] Patient Account Number: [id number] Date of Birth/Sex: Oct 26, 1920 (82 y.o. Female) Treating RN: Huel Coventry Primary Care Physician: Dorothey Baseman Other Clinician: Referring Physician: Dorothey Baseman Treating Physician/Extender: Rudene Re in Treatment: 14 Information Obtained from: Patient Chief Complaint 82 year old patient who is very well-known to as returns after a recent admission to hospital and his prolonged stay in the nursing home. Electronic Signature(s) Signed: 06/24/2016 11:11:26 AM By: Evlyn Kanner MD, FACS Entered By: Evlyn Kanner on 06/24/2016 11:11:26 [name] ([id number]) -------------------------------------------------------------------------------- HPI Details Patient Name: [name] Date of Service: 06/24/2016 10:45 AM Medical Record Number: [id number] Patient Account Number: [id number] Date of Birth/Sex: [birth year] (82 y.o. Female) Treating RN: Huel Coventry Primary Care Physician: Dorothey Baseman Other Clinician: Referring Physician: Dorothey Baseman Treating Physician/Extender: Rudene Re in Treatment: 14 History of Present Illness Location: right medial heel and right second toe Quality: Patient reports experiencing a dull pain to affected area(s). Severity: Patient states wound are getting worse. Duration: Patient has had the wound for > 6months prior to seeking treatment at the wound center Timing: Pain in wound is Intermittent (comes and goes Context: The wound appeared gradually over time Modifying Factors: Consults to this date include:vascular surgeon and several recent surgeries. HPI Description: A pleasant 82 year old patient who is very well-known to my practice was seen about a month and a half ago until she was admitted to  hospital and is known to have diabetes mellitus for several years has recently gone through a series of operations with the vascular surgeon Dr. Gilda Crease. In January and February she had several surgeries on the left lower extremity with an attempt to have limb salvage for gangrenous changes of her forefoot. 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 On 01/07/2015 she also had a right lower extremity distal runoff with a angioplasty of the right anterior tibial artery to maximize her blood flow to the foot. She recently had her staples removed at Dr. Marijean Heath office on 01/31/2015 and at that time a right lower extremity duplex was done which showed patent vessels and a patent stent with distal occluded posterior tibial artery. Though the duplex was noncritical the recommendations from the PA at the vascular surgery office was that of a angiogram to be done but the patient said she would rather try some bone care before. The patient has received doxycycline and Cipro in the recent past and she takes oral medications for her diabetes. Other than that I reviewed her list of all her medications. No recent hemoglobin A1c has been done and no recent x-rays of the right foot have been done. 02/18/2015 -- x-ray of the right foot was done on 02/11/2015 and it shows no evidence of acute osteomyelitis of the third toe or of the calcaneus. She had gone to the vascular surgery office and they had noted that there is dehiscence of the left part of the amputation site of the below-knee wound and they have asked [place] to kindly take over the care of this. There've been doing dressings for the right heel and right third toe. 02/25/2015 -- we have received notes from the vascular group who saw her last on 02/13/2015 and she was seen by the PA Ms. Cleda Daub. She had recommended that the patient continue to follow with [place] for wound  care including management of the left BKA stump, which has had dehiscence of the lateral part. They will consider repeating a arterial duplex or angiogram if the right lower extremity does not heal within a reasonable period of time. 03/18/2015 -- he saw the vascular surgeon Dr. Gilda Crease and he has set her up for an angioplasty sometime in the middle of July. she is doing well otherwise. 04/10/2015 -- the patient had a procedure done on 04/08/2015 and this was a right angioplasty of the [place], Charlsey ([id number]) dorsalis pedis, anterior tibial artery, first superficial femoral artery in its midportion. There was successful intervention with recanalization and in-line flow to the right foot. 04/22/2015 -- the patient was looking rather pale today and the daughter confirms that her hemoglobin was down to 6.9. she is being monitored by her PCP. Her Lasix dose was increased and her edema has gone down significantly. 04/29/2015 -- she had vascular studies done and the ABI on the right is 1.3 and the toe pressures were within normal limits. Her hemoglobin is still around 7 and she refuses to take any blood transfusions for religious reasons. Her potassium was normal. 06/10/2015 -- she has a new blister on her right lateral and posterior part of her leg just in the region where the Kerlix bandages were applied and this may be due to an abrasion. 06/24/2015 -- On her right lower extremity she has developed several more blisters which look like small pustules and the drain informed shallow ulcerations. 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 07/01/2015 -- she has an appointment with the PCP this Friday and her vascular surgeon on Monday. Other than that she is on doxycycline and has finished 1 week of treatment. The pustules she had all over have now resolved. 07/08/2015 -- she saw the vascular surgeon who did studies in the office and found that there is poor circulation in the distal lower right leg and  has set her up for an angiogram and possible stenting next Tuesday. 07/22/2015 -- on October 18 she was taken up for a successful revascularization of the anterior tibial vessel on the right side. a PTA and stent placement was done to the right anterior tibial artery. 08/19/2015 -- she has broken out with some linear ulceration in the region of her webspaces of her toes and this is something new. 08/26/2015 -- over the last 3 days there was a streak of redness going from her lower extremity towards her toes but she had no fever or discharge from the wounds. 09/02/2015 -- the redness and cellulitis has gone down but she continues to have a lot of edema of the right lower extremity. 10/28/2015 -- over the last week she has been draining a lot of fluid from her right lower extremities and several of the wounds which had healed in the past and now opened up again 11/06/2015 -- she was seen in the vascular office and the right ABI was more than 1.3 which was consistent with noncompressible arteries due to medial calcification and the right great toe pressure was 0.24 and the PPG waveform showed significant decrease. from talking to the family member I believe no procedure has been planned. She was also seen by her PCP was increased her Lasix to 80 mg a day and has ordered some lab work but these reports are not back. 11/20/2015 -- Recent labs done hemoglobin A1c was 6.2, INR was .93, iron-binding capacity was 310, BMP was within normal limits except for the BUN which was  40 and albumin was 3.1. Her hemoglobin is up to 10.7 with a hematocrit of 34.2 he was advised lymphedema pumps by Dr. Gilda Crease and she has not been wearing these. Her daughter-in- law also says that she has been very noncompliant about elevation of her limbs. 12/18/2015 -- she was seen by Dr. Gilda Crease who agreed with the management and did not think she required any intervention. He discussed elevation and use of lymph pump and will  see her back as required. 01/15/2016 -- we have been seeing help me every other week and from what I understand from the family member she is quite stubborn and noncompliant and does not use any elevation of follow the advice saying [place], Marylon ([id number]) most medications and applications are causing her pain. 03/12/2016 -- was admitted to the hospital on 02/03/2016 with a right lower extremity cellulitis. She was put on vancomycin and Azactam and metronidazole. She was seen by Dr. Wyn Quaker who performed a aortogram and selective right lower extremity angiogram with a angioplasty of the proximal right anterior tibial artery and right posterior tibial artery and peroneal artery. She was also seen by Dr. Sampson Goon of infectious disease who recommended changing over to ciprofloxacin 500 mg by mouth twice a day for 7 more days and doxycycline 100 mg by mouth twice a day for 7 more days. She had 9 days of IV antibiotics including vancomycin, aztreonam and Flagyl and the wound was improving. He recommended that she follow-up with me at the wound center and if her leg was worsening repeat cultures could be done and he would make appropriate changes to the antibiotics. She was discharged home on 02/13/2016 with oral ciprofloxacin and doxycycline. She was seen in follow-up on 02/27/2016 at the vascular office for a postop visit status post right lower extremity angiogram and the postprocedure course was found to be uneventful. He was told to come back in a month for an ABI and right lower extremity duplex study to assess her PAD. of note recent x-rays done on April 27 and May 17 did not show any osteomyelitis of the second toe x-ray of the right foot -- IMPRESSION:Findings compatible with cellulitis of the second toe. No objective evidence of osteomyelitis but there is severe diffuse osteopenia which limits sensitivity of the study. 04/15/2016 -- agent has now got dependent edema and has been oozing  fluid from the lower extremity and from the right foot. He is rather stubborn about not elevating her leg. 04/29/2016 -- she was seen in the vascular office and at that time had a significant cellulitis and was put on oral doxycycline for 2 weeks. This seems to have helped her immensely and she was also wrapped with an wound Unna's boots. 05/20/2016 -- she did very well with tri-tech Ultrafoam but ran out of this and used a different alginate and this kept a lot of moisture in the wound Electronic Signature(s) Signed: 06/24/2016 11:11:38 AM By: Evlyn Kanner MD, FACS Entered By: Evlyn Kanner on 06/24/2016 11:11:38 [name] ([id number]) -------------------------------------------------------------------------------- Physical Exam Details Patient Name: [name] Date of Service: 06/24/2016 10:45 AM Medical Record Number: [id number] Patient Account Number: [id number] Date of Birth/Sex: Feb 10, 1921 (82 y.o. Female) Treating RN: Huel Coventry Primary Care Physician: Dorothey Baseman Other Clinician: Referring Physician: Dorothey Baseman Treating Physician/Extender: Rudene Re in Treatment: 14 Constitutional . Pulse regular. Respirations normal and unlabored. Afebrile. . Eyes Nonicteric. Reactive to light. Ears, Nose, Mouth, and Throat Lips, teeth, and gums WNL.Marland Kitchen Moist mucosa without lesions. Neck supple  and nontender. No palpable supraclavicular or cervical adenopathy. Normal sized without goiter. Respiratory WNL. No retractions.. Cardiovascular Pedal Pulses WNL. No clubbing, cyanosis or edema. Lymphatic No adneopathy. No adenopathy. No adenopathy. Musculoskeletal Adexa without tenderness or enlargement.. Digits and nails w/o clubbing, cyanosis, infection, petechiae, ischemia, or inflammatory conditions.. Integumentary (Hair, Skin) No suspicious lesions. No crepitus or fluctuance. No peri-wound warmth or erythema. No masses.Marland Kitchen Psychiatric Judgement and insight  Intact.. No evidence of depression, anxiety, or agitation.. Notes her right lower extremity is completely healed and the area on the second toe has a small eschar which when removed shows healing epithelium over a shallow ulcer. No drainage or cellulitis or fungal infection. Electronic Signature(s) Signed: 06/24/2016 11:12:32 AM By: Evlyn Kanner MD, FACS Entered By: Evlyn Kanner on 06/24/2016 11:12:32 [name] ([id number]) -------------------------------------------------------------------------------- Physician Orders Details Patient Name: [name] Date of Service: 06/24/2016 10:45 AM Medical Record Number: [id number] Patient Account Number: [id number] Date of Birth/Sex: 1921-07-13 (82 y.o. Female) Treating RN: Clover Mealy, RN, BSN, [place] Primary Care Physician: Dorothey Baseman Other Clinician: Referring Physician: Terance Hart DAVID Treating Physician/Extender: Rudene Re in Treatment: 47 Verbal / Phone Orders: Yes Clinician: Afful, RN, BSN, Rita Read Back and Verified: Yes Diagnosis Coding Home Health o D/C Home Health Services - Patient discharged from wound care services. HH may continue if seeing patient for other home health purposes. Discharge From [facility] Services o Discharge from Wound Care Center - Treatment Completed. Patient may come back as needed for excess drainage. Instructed by MD to cover with bandaid. Electronic Signature(s) Signed: 06/24/2016 4:25:16 PM By: Evlyn Kanner MD, FACS Signed: 06/24/2016 5:32:18 PM By: Elpidio Eric BSN, RN Entered By: Elpidio Eric on 06/24/2016 11:03:21 [name] ([id number]) -------------------------------------------------------------------------------- Problem List Details Patient Name: [name] Date of Service: 06/24/2016 10:45 AM Medical Record Number: [id number] Patient Account Number: [id number] Date of Birth/Sex: 1921/09/16 (82 y.o. 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 03/12/2016 Yes fat layer exposed L97.812 Non-pressure chronic ulcer of other part of right lower leg 03/12/2016 Yes with fat layer exposed Inactive Problems Resolved Problems Electronic Signature(s) Signed: 06/24/2016 11:11:14 AM By: Evlyn Kanner MD, FACS Entered By: Evlyn Kanner on 06/24/2016 11:11:13 [name] ([id number]) -------------------------------------------------------------------------------- Progress Note Details Patient Name: [name] Date of Service: 06/24/2016 10:45 AM Medical Record Number: [id number] Patient Account Number: [id number] Date of Birth/Sex: 1920/12/12 (82 y.o. Female) Treating RN: Huel Coventry Primary Care Physician: Dorothey Baseman Other Clinician: Referring Physician: Dorothey Baseman Treating Physician/Extender: Rudene Re in Treatment: 14 Subjective Chief Complaint Information obtained from Patient 82 year old patient who is very well-known to as returns after a recent admission to hospital and his prolonged stay in the nursing home. History of Present Illness (HPI) The following HPI elements were documented for the patient's wound: Location: right medial heel and right second toe Quality: Patient reports experiencing a dull pain to affected area(s). Severity: Patient states wound are getting worse. Duration: Patient has had the wound for > 6months prior to seeking treatment at the wound center Timing: Pain in wound is  Intermittent (comes and goes Context: The  wound appeared gradually over time Modifying Factors: Consults to this date include:vascular surgeon and several recent surgeries. A pleasant 82 year old patient who is very well-known to my practice was seen about a month and a half ago until she was admitted to hospital and is known to have diabetes mellitus for several years has recently gone through a series of operations with the vascular surgeon Dr. Gilda Crease. In January and February she had several surgeries on the left lower extremity with an attempt to have limb salvage for gangrenous changes of her forefoot. 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 On 01/07/2015 she also had a right lower extremity distal runoff with a angioplasty of the right anterior tibial artery to maximize her blood flow to the foot. She recently had her staples removed at Dr. Marijean Heath office on 01/31/2015 and at that time a right lower extremity duplex was done which showed patent vessels and a patent stent with distal occluded posterior tibial artery. Though the duplex was noncritical the recommendations from the PA at the vascular surgery office was that of a angiogram to be done but the patient said she would rather try some bone care before. The patient has received doxycycline and Cipro in the recent past and she takes oral medications for her diabetes. Other than that I reviewed her list of all her medications. No recent hemoglobin A1c has been done and no recent x-rays of the right foot have been done. 02/18/2015 -- x-ray of the right foot was done on 02/11/2015 and it shows no evidence of acute osteomyelitis of the third toe or of the calcaneus. She had gone to the vascular surgery office and they had noted that there is dehiscence of the left part of the amputation site of the below-knee wound and they have asked [place] to kindly take over  the care of this. There've been doing dressings for the right heel and right third toe. 02/25/2015 -- we have received notes from the vascular group who saw her last on 02/13/2015 and she [name], [name] ([id number]) was seen by the PA Ms. Cleda Daub. She had recommended that the patient continue to follow with [place] for wound care including management of the left BKA stump, which has had dehiscence of the lateral part. They will consider repeating a arterial duplex or angiogram if the right lower extremity does not heal within a reasonable period of time. 03/18/2015 -- he saw the vascular surgeon Dr. Gilda Crease and he has set her up for an angioplasty sometime in the middle of July. she is doing well otherwise. 04/10/2015 -- the patient had a procedure done on 04/08/2015 and this was a right angioplasty of the dorsalis pedis, anterior tibial artery, first superficial femoral artery in its midportion. There was successful intervention with recanalization and in-line flow to the right foot. 04/22/2015 -- the patient was looking rather pale today and the daughter confirms that her hemoglobin was down to 6.9. she is being monitored by her PCP. Her Lasix dose was increased and her edema has gone down significantly. 04/29/2015 -- she had vascular studies done and the ABI on the right is 1.3 and the toe pressures were within normal limits. Her hemoglobin is still around 7 and she refuses to take any blood transfusions for religious reasons. Her potassium was normal. 06/10/2015 -- she has a new blister on her right lateral and posterior part of her leg just in the region where  the Kerlix bandages were applied and this may be due to an abrasion. 06/24/2015 -- On her right lower extremity she has developed several more blisters which look like small pustules and the drain informed shallow ulcerations. 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 07/01/2015 -- she has an appointment with the PCP this Friday and  her vascular surgeon on Monday. Other than that she is on doxycycline and has finished 1 week of treatment. The pustules she had all over have now resolved. 07/08/2015 -- she saw the vascular surgeon who did studies in the office and found that there is poor circulation in the distal lower right leg and has set her up for an angiogram and possible stenting next Tuesday. 07/22/2015 -- on October 18 she was taken up for a successful revascularization of the anterior tibial vessel on the right side. a PTA and stent placement was done to the right anterior tibial artery. 08/19/2015 -- she has broken out with some linear ulceration in the region of her webspaces of her toes and this is something new. 08/26/2015 -- over the last 3 days there was a streak of redness going from her lower extremity towards her toes but she had no fever or discharge from the wounds. 09/02/2015 -- the redness and cellulitis has gone down but she continues to have a lot of edema of the right lower extremity. 10/28/2015 -- over the last week she has been draining a lot of fluid from her right lower extremities and several of the wounds which had healed in the past and now opened up again 11/06/2015 -- she was seen in the vascular office and the right ABI was more than 1.3 which was consistent with noncompressible arteries due to medial calcification and the right great toe pressure was 0.24 and the PPG waveform showed significant decrease. from talking to the family member I believe no procedure has been planned. She was also seen by her PCP was increased her Lasix to 80 mg a day and has ordered some lab work but these reports are not back. 11/20/2015 -- Recent labs done hemoglobin A1c was 6.2, INR was .93, iron-binding capacity was 310, BMP was within normal limits except for the BUN which was 40 and albumin was 3.1. Her hemoglobin is up to [name], [name] ([id number]) 10.7 with a hematocrit of 34.2 he was advised  lymphedema pumps by Dr. Gilda Crease and she has not been wearing these. Her daughter-in- law also says that she has been very noncompliant about elevation of her limbs. 12/18/2015 -- she was seen by Dr. Gilda Crease who agreed with the management and did not think she required any intervention. He discussed elevation and use of lymph pump and will see her back as required. 01/15/2016 -- we have been seeing help me every other week and from what I understand from the family member she is quite stubborn and noncompliant and does not use any elevation of follow the advice saying most medications and applications are causing her pain. 03/12/2016 -- was admitted to the hospital on 02/03/2016 with a right lower extremity cellulitis. She was put on vancomycin and Azactam and metronidazole. She was seen by Dr. Wyn Quaker who performed a aortogram and selective right lower extremity angiogram with a angioplasty of the proximal right anterior tibial artery and right posterior tibial artery and peroneal artery. She was also seen by Dr. Sampson Goon of infectious disease who recommended changing over to ciprofloxacin 500 mg by mouth twice a day for 7 more days and doxycycline  100 mg by mouth twice a day for 7 more days. She had 9 days of IV antibiotics including vancomycin, aztreonam and Flagyl and the wound was improving. He recommended that she follow-up with me at the wound center and if her leg was worsening repeat cultures could be done and he would make appropriate changes to the antibiotics. She was discharged home on 02/13/2016 with oral ciprofloxacin and doxycycline. She was seen in follow-up on 02/27/2016 at the vascular office for a postop visit status post right lower extremity angiogram and the postprocedure course was found to be uneventful. He was told to come back in a month for an ABI and right lower extremity duplex study to assess her PAD. of note recent x-rays done on April 27 and May 17 did not show any  osteomyelitis of the second toe x-ray of the right foot -- IMPRESSION:Findings compatible with cellulitis of the second toe. No objective evidence of osteomyelitis but there is severe diffuse osteopenia which limits sensitivity of the study. 04/15/2016 -- agent has now got dependent edema and has been oozing fluid from the lower extremity and from the right foot. He is rather stubborn about not elevating her leg. 04/29/2016 -- she was seen in the vascular office and at that time had a significant cellulitis and was put on oral doxycycline for 2 weeks. This seems to have helped her immensely and she was also wrapped with an wound Unna's boots. 05/20/2016 -- she did very well with tri-tech Ultrafoam but ran out of this and used a different alginate and this kept a lot of moisture in the wound Objective Constitutional Pulse regular. Respirations normal and unlabored. Afebrile. Vitals Time Taken: 10:45 AM, Temperature: 97.7 F, Pulse: 68 bpm, Respiratory Rate: 16 breaths/min, Blood Pressure: 104/67 mmHg. [name], [name] ([id number]) Eyes Nonicteric. Reactive to light. Ears, Nose, Mouth, and Throat Lips, teeth, and gums WNL.Marland Kitchen Moist mucosa without lesions. Neck supple and nontender. No palpable supraclavicular or cervical adenopathy. Normal sized without goiter. Respiratory WNL. No retractions.. Cardiovascular Pedal Pulses WNL. No clubbing, cyanosis or edema. Lymphatic No adneopathy. No adenopathy. No adenopathy. Musculoskeletal Adexa without tenderness or enlargement.. Digits and nails w/o clubbing, cyanosis, infection, petechiae, ischemia, or inflammatory conditions.Marland Kitchen Psychiatric Judgement and insight Intact.. No evidence of depression, anxiety, or agitation.. General Notes: her right lower extremity is completely healed and the area on the second toe has a small eschar which when removed shows healing epithelium over a shallow ulcer. No drainage or cellulitis or fungal  infection. Integumentary (Hair, Skin) No suspicious lesions. No crepitus or fluctuance. No peri-wound warmth or erythema. No masses.. Wound #12 status is Healed - Epithelialized. Original cause of wound was Pressure Injury. The wound is located on the Right,Dorsal Toe Second. The wound measures 0cm length x 0cm width x 0cm depth; 0cm^2 area and 0cm^3 volume. The wound is limited to skin breakdown. There is no tunneling or undermining noted. There is a none present amount of drainage noted. The wound margin is flat and intact. There is no granulation within the wound bed. There is no necrotic tissue within the wound bed. The periwound skin appearance exhibited: Localized Edema, Dry/Scaly, Hemosiderin Staining. The periwound skin appearance did not exhibit: Callus, Crepitus, Excoriation, Fluctuance, Friable, Induration, Rash, Scarring, Maceration, Moist, Atrophie Blanche, Cyanosis, Ecchymosis, Mottled, Pallor, Rubor, Erythema. Periwound temperature was noted as No Abnormality. 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 HH may continue if seeing patient for other home health purposes. [facility] Discharge from [facility] - Treatment Completed. Patient may come back as needed for excess drainage. Instructed by MD to cover with bandaid. I have recommended: 1. inset completely healed and I have discharged her from the wound care services. 2. Elevation and exercise has also been  emphasized. 3. Mild compression with Kerlix and Coban. Electronic Signature(s) Signed: 06/24/2016 11:13:50 AM By: Evlyn KannerBritto, Korin Setzler MD, FACS Entered By: Evlyn KannerBritto, Ilyse Tremain on 06/24/2016 11:13:49 [name] NationsSILETZKY, [name] ([id number]) -------------------------------------------------------------------------------- SuperBill Details Patient Name: [name] NationsSILETZKY, [name] Date of Service: 06/24/2016 Medical Record Number: [id number] Patient Account Number: [id number] Date of Birth/Sex: 08/20/1921 (82 y.o. 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 L97.812 Non-pressure chronic ulcer of other part of right lower leg with fat layer exposed Facility Procedures CPT4 Code: [id number] Description: [id number] - WOUND CARE VISIT-LEV 2 EST PT Modifier: Quantity: 1 Physician Procedures CPT4: Description Modifier Quantity Code [id number] - WC PHYS LEVEL 2 - EST PT 1 ICD-10 Description Diagnosis E11.622 Type 2 diabetes mellitus with other skin ulcer I70.235 Atherosclerosis of native arteries of right leg with ulceration of other part  of foot 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

## 2016-06-25 NOTE — Progress Notes (Signed)
[name], [name] ([id number]) Visit Report for 06/24/2016 Arrival Information Details Patient Name: [name] Date of Service: 06/24/2016 10:45 AM Medical Record Number: [id number] Patient Account Number: [id number] Date of Birth/Sex: 1921/06/04 (82 y.o. Female) Treating RN: Huel Coventry Primary Care Physician: Dorothey Baseman Other Clinician: Referring Physician: Dorothey Baseman Treating Physician/Extender: Rudene Re in Treatment: 14 Visit Information History Since Last Visit Added or deleted any medications: No Patient Arrived: Wheel Chair Any new allergies or adverse reactions: No Arrival Time: 10:42 Had a fall or experienced change in No Accompanied By: daughter in law activities of daily living that may affect Transfer Assistance: Manual risk of falls: Patient Identification Verified: Yes Signs or symptoms of abuse/neglect since last No Secondary Verification Process Yes visito Completed: Hospitalized since last visit: No Patient Requires Transmission- No Has Dressing in Place as Prescribed: Yes Based Precautions: Has Compression in Place as Prescribed: Yes Patient Has Alerts: Yes Pain Present Now: No Patient Alerts: Patient on Blood Thinner DM II Eliquis Electronic Signature(s) Signed: 06/24/2016 1:56:10 PM By: Elliot Gurney, RN, BSN, Kim RN, BSN Entered By: Elliot Gurney, RN, BSN, Kim on 06/24/2016 10:43:28 [name] ([id number]) -------------------------------------------------------------------------------- Clinic Level of Care Assessment Details Patient Name: [name] Date of Service: 06/24/2016 10:45 AM Medical Record Number: [id number] Patient Account Number: [id number] Date of Birth/Sex: 1921/03/22 (82 y.o. Female) Treating RN: Afful, RN, BSN, [place] Sink Primary Care Physician: Terance Hart, DAVID Other Clinician: Referring Physician: Terance Hart, DAVID Treating Physician/Extender: Rudene Re in Treatment: 14 Clinic Level of Care Assessment  Items TOOL 4 Quantity Score []  - Use when only an EandM is performed on FOLLOW-UP visit 0 ASSESSMENTS - Nursing Assessment / Reassessment X - Reassessment of Co-morbidities (includes updates in patient status) 1 10 X - Reassessment of Adherence to Treatment Plan 1 5 ASSESSMENTS - Wound and Skin Assessment / Reassessment X - Simple Wound Assessment / Reassessment - one wound 1 5 []  - Complex Wound Assessment / Reassessment - multiple wounds 0 []  - Dermatologic / Skin Assessment (not related to wound area) 0 ASSESSMENTS - Focused Assessment []  - Circumferential Edema Measurements - multi extremities 0 []  - Nutritional Assessment / Counseling / Intervention 0 X - Lower Extremity Assessment (monofilament, tuning fork, pulses) 1 5 []  - Peripheral Arterial Disease Assessment (using hand held doppler) 0 ASSESSMENTS - Ostomy and/or Continence Assessment and Care []  - Incontinence Assessment and Management 0 []  - Ostomy Care Assessment and Management (repouching, etc.) 0 PROCESS - Coordination of Care X - Simple Patient / Family Education for ongoing care 1 15 []  - Complex (extensive) Patient / Family Education for ongoing care 0 []  - Staff obtains Chiropractor, Records, Test Results / Process Orders 0 []  - Staff telephones HHA, Nursing Homes / Clarify orders / etc 0 []  - Routine Transfer to another Facility (non-emergent condition) 0 [name], [name] ([id number]) []  - Routine Hospital Admission (non-emergent condition) 0 []  - New Admissions / Manufacturing engineer / Ordering NPWT, Apligraf, etc. 0 []  - Emergency Hospital Admission (emergent condition) 0 []  - Simple Discharge Coordination 0 []  - Complex (extensive) Discharge Coordination 0 PROCESS - Special Needs []  - Pediatric / Minor Patient Management 0 []  - Isolation Patient Management 0 []  - Hearing / Language / Visual special needs 0 []  - Assessment of Community assistance (transportation, D/C planning, etc.) 0 []  - Additional assistance  / Altered mentation 0 []  - Support Surface(s) Assessment (bed, cushion, seat, etc.) 0 INTERVENTIONS - Wound Cleansing / Measurement []  - Simple Wound Cleansing - one wound 0 []  -  Complex Wound Cleansing - multiple wounds 0 X - Wound Imaging (photographs - any number of wounds) 1 5 []  - Wound Tracing (instead of photographs) 0 []  - Simple Wound Measurement - one wound 0 []  - Complex Wound Measurement - multiple wounds 0 INTERVENTIONS - Wound Dressings []  - Small Wound Dressing one or multiple wounds 0 []  - Medium Wound Dressing one or multiple wounds 0 []  - Large Wound Dressing one or multiple wounds 0 []  - Application of Medications - topical 0 []  - Application of Medications - injection 0 INTERVENTIONS - Miscellaneous []  - External ear exam 0 [name], [name] ([id number]) []  - Specimen Collection (cultures, biopsies, blood, body fluids, etc.) 0 []  - Specimen(s) / Culture(s) sent or taken to Lab for analysis 0 []  - Patient Transfer (multiple staff / Nurse, adult / Similar devices) 0 []  - Simple Staple / Suture removal (25 or less) 0 []  - Complex Staple / Suture removal (26 or more) 0 []  - Hypo / Hyperglycemic Management (close monitor of Blood Glucose) 0 []  - Ankle / Brachial Index (ABI) - do not check if billed separately 0 X - Vital Signs 1 5 Has the patient been seen at the hospital within the last three years: Yes Total Score: 50 Level Of Care: New/Established - Level 2 Electronic Signature(s) Signed: 06/24/2016 5:32:18 PM By: Elpidio Eric BSN, RN Entered By: Elpidio Eric on 06/24/2016 11:00:28 [name] ([id number]) -------------------------------------------------------------------------------- Encounter Discharge Information Details Patient Name: [name] Date of Service: 06/24/2016 10:45 AM Medical Record Number: [id number] Patient Account Number: [id number] Date of Birth/Sex: July 03, 1921 (82 y.o. Female) Treating RN: Huel Coventry Primary Care Physician: Dorothey Baseman Other Clinician: Referring Physician: Dorothey Baseman Treating Physician/Extender: Rudene Re in Treatment: 14 Encounter Discharge Information Items Discharge Pain Level: 0 Discharge Condition: Stable Ambulatory Status: Wheelchair Discharge Destination: Home Transportation: Private Auto Accompanied By: self Schedule Follow-up Appointment: No Medication Reconciliation completed and provided to Patient/Care Yes Deborahann Poteat: Provided on Clinical Summary of Care: 06/24/2016 Form Type Recipient Paper Patient HS Electronic Signature(s) Signed: 06/24/2016 11:11:04 AM By: Gwenlyn Perking Entered By: Gwenlyn Perking on 06/24/2016 11:11:04 [name] ([id number]) -------------------------------------------------------------------------------- Lower Extremity Assessment Details Patient Name: [name] Date of Service: 06/24/2016 10:45 AM Medical Record Number: [id number] Patient Account Number: [id number] Date of Birth/Sex: 1920-10-24 (82 y.o. Female) Treating RN: Huel Coventry Primary Care Physician: Dorothey Baseman Other Clinician: Referring Physician: Dorothey Baseman Treating Physician/Extender: Rudene Re in Treatment: 14 Vascular Assessment Pulses: Posterior Tibial Palpable: [Right:Yes] Dorsalis Pedis Palpable: [Right:Yes] Extremity colors, hair growth, and conditions: Extremity Color: [Right:Hyperpigmented] Hair Growth on Extremity: [Right:Yes] Temperature of Extremity: [Right:Warm] Capillary Refill: [Right:< 3 seconds] Dependent Rubor: [Right:No] Blanched when Elevated: [Right:No] Lipodermatosclerosis: [Right:No] Toe Nail Assessment Left: Right: Thick: Yes Discolored: Yes Deformed: No Improper Length and Hygiene: No Electronic Signature(s) Signed: 06/24/2016 1:56:10 PM By: Elliot Gurney, RN, BSN, Kim RN, BSN Entered By: Elliot Gurney, RN, BSN, Kim on 06/24/2016 10:48:20 [name]  ([id number]) -------------------------------------------------------------------------------- Multi Wound Chart Details Patient Name: [name] Date of Service: 06/24/2016 10:45 AM Medical Record Number: [id number] Patient Account Number: [id number] Date of Birth/Sex: [birth year] (82 y.o. Female) Treating RN: Clover Mealy, RN, BSN, [place] Sink Primary Care Physician: Dorothey Baseman Other Clinician: Referring Physician: Terance Hart, DAVID Treating Physician/Extender: Rudene Re in Treatment: 14 Vital Signs Height(in): Pulse(bpm): 68 Weight(lbs): Blood Pressure 104/67 (mmHg): Body Mass Index(BMI): Temperature(F): 97.7 Respiratory Rate 16 (breaths/min): Photos: [N/A:N/A] Wound Location: Right Toe Second - Dorsal N/A N/A Wounding Event: Pressure Injury N/A N/A Primary Etiology: Pressure Ulcer N/A  N/A Comorbid History: Arrhythmia, Congestive N/A N/A Heart Failure, Deep Vein Thrombosis, Peripheral Arterial Disease, Type II Diabetes, Gout, Osteoarthritis Date Acquired: 01/26/2015 N/A N/A Weeks of Treatment: 14 N/A N/A Wound Status: Open N/A N/A Measurements L x W x D 0.2x0.4x0.1 N/A N/A (cm) Area (cm) : 0.063 N/A N/A Volume (cm) : 0.006 N/A N/A % Reduction in Area: 83.60% N/A N/A % Reduction in Volume: 84.20% N/A N/A Classification: Category/Stage II N/A N/A HBO Classification: Grade 1 N/A N/A Exudate Amount: Medium N/A N/A Exudate Type: Serosanguineous N/A N/A Exudate Color: red, brown N/A N/A Wound Margin: Flat and Intact N/A N/A [name], [name] ([id number]) Granulation Amount: Large (67-100%) N/A N/A Granulation Quality: Pink N/A N/A Necrotic Amount: Small (1-33%) N/A N/A Exposed Structures: Fascia: No N/A N/A Fat: No Tendon: No Muscle: No Joint: No Bone: No Limited to Skin Breakdown Epithelialization: Large (67-100%) N/A N/A Periwound Skin Texture: Edema: Yes N/A N/A Excoriation: No Induration: No Callus: No Crepitus: No Fluctuance: No Friable:  No Rash: No Scarring: No Periwound Skin Maceration: Yes N/A N/A Moisture: Moist: Yes Dry/Scaly: Yes Periwound Skin Color: Hemosiderin Staining: Yes N/A N/A Atrophie Blanche: No Cyanosis: No Ecchymosis: No Erythema: No Mottled: No Pallor: No Rubor: No Temperature: No Abnormality N/A N/A Tenderness on Yes N/A N/A Palpation: Wound Preparation: Ulcer Cleansing: N/A N/A Rinsed/Irrigated with Saline Topical Anesthetic Applied: Other: lidocaine 4% Treatment Notes Electronic Signature(s) Signed: 06/24/2016 5:32:18 PM By: Elpidio EricAfful, Rita BSN, RN Entered By: Elpidio EricAfful, Rita on 06/24/2016 10:58:34 [name] NationsSILETZKY, [name] ([id number]) [name] NationsSILETZKY, [name] ([id number]) -------------------------------------------------------------------------------- Multi-Disciplinary Care Plan Details Patient Name: [name] NationsSILETZKY, [name] Date of Service: 06/24/2016 10:45 AM Medical Record Number: [id number] Patient Account Number: [id number] Date of Birth/Sex: 01/12/1921 (82 y.o. Female) Treating RN: Clover MealyAfful, RN, BSN, [place] Sink[place] Primary Care Physician: Dorothey BasemanBRONSTEIN, DAVID Other Clinician: Referring Physician: Dorothey BasemanBRONSTEIN, DAVID Treating Physician/Extender: Rudene ReBritto, Errol Weeks in Treatment: 14 Active Inactive Electronic Signature(s) Signed: 06/24/2016 11:14:12 AM By: Elpidio EricAfful, Rita BSN, RN Entered By: Elpidio EricAfful, Rita on 06/24/2016 11:14:09 [name] NationsSILETZKY, [name] ([id number]) -------------------------------------------------------------------------------- Pain Assessment Details Patient Name: [name] NationsSILETZKY, [name] Date of Service: 06/24/2016 10:45 AM Medical Record Number: [id number] Patient Account Number: [id number] Date of Birth/Sex: 03/13/1921 (82 y.o. Female) Treating RN: Huel CoventryWoody, Kim Primary Care Physician: Dorothey BasemanBRONSTEIN, DAVID Other Clinician: Referring Physician: Dorothey BasemanBRONSTEIN, DAVID Treating Physician/Extender: Rudene ReBritto, Errol Weeks in Treatment: 14 Active Problems Location of Pain Severity and Description of Pain Patient Has Paino No Site Locations With  Dressing Change: No Pain Management and Medication Current Pain Management: Electronic Signature(s) Signed: 06/24/2016 1:56:10 PM By: Elliot GurneyWoody, RN, BSN, Kim RN, BSN Entered By: Elliot GurneyWoody, RN, BSN, Kim on 06/24/2016 10:45:10 [name] NationsSILETZKY, [name] ([id number]) -------------------------------------------------------------------------------- Patient/Caregiver Education Details Patient Name: [name] NationsSILETZKY, [name] Date of Service: 06/24/2016 10:45 AM Medical Record Number: [id number] Patient Account Number: [id number] Date of Birth/Gender: 01/13/1921 (82 y.o. Female) Treating RN: Huel CoventryWoody, Kim Primary Care Physician: Dorothey BasemanBRONSTEIN, DAVID Other Clinician: Referring Physician: Dorothey BasemanBRONSTEIN, DAVID Treating Physician/Extender: Rudene ReBritto, Errol Weeks in Treatment: 14 Education Assessment Education Provided To: Patient Education Topics Provided Wound/Skin Impairment: Psychologist, prison and probation serviceslectronic Signature(s) Signed: 06/24/2016 1:56:10 PM By: Elliot GurneyWoody, RN, BSN, Kim RN, BSN Entered By: Elliot GurneyWoody, RN, BSN, Kim on 06/24/2016 11:10:35 [name] NationsSILETZKY, [name] ([id number]) -------------------------------------------------------------------------------- Wound Assessment Details Patient Name: [name] NationsSILETZKY, Kaisa Date of Service: 06/24/2016 10:45 AM Medical Record Number: [id number] Patient Account Number: [id number] Date of Birth/Sex: 09/27/1920 (82 y.o. Female) Treating RN: Clover MealyAfful, RN, BSN, [place] Sinkita Primary Care Physician: Dorothey BasemanBRONSTEIN, DAVID Other Clinician: Referring Physician: Terance HartBRONSTEIN, DAVID Treating Physician/Extender: Rudene ReBritto, Errol Weeks in Treatment: 14 Wound Status Wound Number: 12 Primary Pressure Ulcer Etiology: Wound Location: Right Toe Second -  Dorsal Wound Healed - Epithelialized Wounding Event: Pressure Injury Status: Date Acquired: 01/26/2015 Comorbid Arrhythmia, Congestive Heart Failure, Weeks Of Treatment: 14 History: Deep Vein Thrombosis, Peripheral Clustered Wound: No Arterial Disease, Type II Diabetes, Gout, Osteoarthritis Photos Wound  Measurements Length: (cm) 0 % Reduction Width: (cm) 0 % Reduction Depth: (cm) 0 Epitheliali Area: (cm) 0 Tunneling: Volume: (cm) 0 Underminin in Area: 100% in Volume: 100% zation: Large (67-100%) No g: No Wound Description Classification: Category/Stage II Foul Odor Diabetic Severity (Wagner): Grade 1 Wound Margin: Flat and Intact Exudate Amount: None Present After Cleansing: No Wound Bed Granulation Amount: None Present (0%) Exposed Structure Necrotic Amount: None Present (0%) Fascia Exposed: No Fat Layer Exposed: No Tendon Exposed: No Muscle Exposed: No Joint Exposed: No Bone Exposed: No Nutting, Mandisa ([id number]) Limited to Skin Breakdown Periwound Skin Texture Texture Color No Abnormalities Noted: No No Abnormalities Noted: No Callus: No Atrophie Blanche: No Crepitus: No Cyanosis: No Excoriation: No Ecchymosis: No Fluctuance: No Erythema: No Friable: No Hemosiderin Staining: Yes Induration: No Mottled: No Localized Edema: Yes Pallor: No Rash: No Rubor: No Scarring: No Temperature / Pain Moisture Temperature: No Abnormality No Abnormalities Noted: No Dry / Scaly: Yes Maceration: No Moist: No Wound Preparation Ulcer Cleansing: Rinsed/Irrigated with Saline Topical Anesthetic Applied: None Electronic Signature(s) Signed: 06/24/2016 5:32:18 PM By: Elpidio Eric BSN, RN Entered By: Elpidio Eric on 06/24/2016 11:04:09 [name] ([id number]) -------------------------------------------------------------------------------- Vitals Details Patient Name: [name] Date of Service: 06/24/2016 10:45 AM Medical Record Number: [id number] Patient Account Number: [id number] Date of Birth/Sex: 1921-03-30 (82 y.o. Female) Treating RN: Huel Coventry Primary Care Physician: Terance Hart, DAVID Other Clinician: Referring Physician: Dorothey Baseman Treating Physician/Extender: Rudene Re in Treatment: 14 Vital Signs Time Taken: 10:45 Temperature  (F): 97.7 Pulse (bpm): 68 Respiratory Rate (breaths/min): 16 Blood Pressure (mmHg): 104/67 Reference Range: 80 - 120 mg / dl Electronic Signature(s) Signed: 06/24/2016 1:56:10 PM By: Elliot Gurney, RN, BSN, Kim RN, BSN Entered By: Elliot Gurney, RN, BSN, Kim on 06/24/2016 10:46:12

## 2016-07-15 ENCOUNTER — Encounter: Payer: Medicare Other | Attending: Surgery | Admitting: Surgery

## 2016-07-15 DIAGNOSIS — I70235 Atherosclerosis of native arteries of right leg with ulceration of other part of foot: Secondary | ICD-10-CM | POA: Diagnosis not present

## 2016-07-15 DIAGNOSIS — L97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: Secondary | ICD-10-CM | POA: Insufficient documentation

## 2016-07-15 DIAGNOSIS — Z89512 Acquired absence of left leg below knee: Secondary | ICD-10-CM | POA: Diagnosis not present

## 2016-07-15 DIAGNOSIS — E11622 Type 2 diabetes mellitus with other skin ulcer: Secondary | ICD-10-CM | POA: Insufficient documentation

## 2016-07-15 DIAGNOSIS — L97812 Non-pressure chronic ulcer of other part of right lower leg with fat layer exposed: Secondary | ICD-10-CM | POA: Insufficient documentation

## 2016-07-16 NOTE — Progress Notes (Signed)
[name], [name] ([id number]) Visit Report for 07/15/2016 Arrival Information Details Patient Name: [name], [name] 07/15/2016 12:45 Date of Service: PM Medical Record [id number] Number: Patient Account Number: [id number] 1920/10/24 (82 y.o. Treating RN: [name] Date of Birth/Sex: Female) Other Clinician: Primary Care Physician: [name] Treating [name] Referring Physician: Dorothey [name] Physician/Extender: [name] in Treatment: 17 Visit Information History Since Last Visit Added or deleted any medications: No Patient Arrived: Wheel Chair Any new allergies or adverse reactions: No Arrival Time: 12:58 Had a fall or experienced change in No Accompanied By: dtr in law activities of daily living that may affect Transfer Assistance: Manual risk of falls: Patient Identification Verified: Yes Signs or symptoms of abuse/neglect since last No Secondary Verification Process Yes visito Completed: Hospitalized since last visit: No Patient Requires Transmission- No Pain Present Now: No Based Precautions: Patient Has Alerts: Yes Patient Alerts: Patient on Blood Thinner DM II Eliquis Electronic Signature(s) Signed: 07/15/2016 5:26:42 PM By: [name] Entered By: [name] on 07/15/2016 13:05:23 [name] Nations ([id number]) -------------------------------------------------------------------------------- Clinic Level of Care Assessment Details Patient Name: [name], [name] 07/15/2016 12:45 Date of Service: PM Medical Record [id number] Number: Patient Account Number: [id number] 07/01/1921 (82 y.o. Treating RN: [name], [name] Date of Birth/Sex: Female) Other Clinician: Primary Care Physician: [name] Treating [name] Referring Physician: Terance Hart, [name] Physician/Extender: Weeks in Treatment: 17 Clinic Level of Care Assessment Items TOOL 4 Quantity Score []  - Use when only an EandM is performed on FOLLOW-UP visit 0 ASSESSMENTS  - Nursing Assessment / Reassessment X - Reassessment of Co-morbidities (includes updates in patient status) 1 10 X - Reassessment of Adherence to Treatment Plan 1 5 ASSESSMENTS - Wound and Skin Assessment / Reassessment []  - Simple Wound Assessment / Reassessment - one wound 0 []  - Complex Wound Assessment / Reassessment - multiple wounds 0 []  - Dermatologic / Skin Assessment (not related to wound area) 0 ASSESSMENTS - Focused Assessment []  - Circumferential Edema Measurements - multi extremities 0 []  - Nutritional Assessment / Counseling / Intervention 0 X - Lower Extremity Assessment (monofilament, tuning fork, pulses) 1 5 []  - Peripheral Arterial Disease Assessment (using hand held doppler) 0 ASSESSMENTS - Ostomy and/or Continence Assessment and Care []  - Incontinence Assessment and Management 0 []  - Ostomy Care Assessment and Management (repouching, etc.) 0 PROCESS - Coordination of Care X - Simple Patient / Family Education for ongoing care 1 15 []  - Complex (extensive) Patient / Family Education for ongoing care 0 []  - Staff obtains Chiropractor, Records, Test Results / Process Orders 0 []  - Staff telephones HHA, Nursing Homes / Clarify orders / etc 0 [name], [name] ([id number]) []  - Routine Transfer to another Facility (non-emergent condition) 0 []  - Routine Hospital Admission (non-emergent condition) 0 []  - New Admissions / Manufacturing engineer / Ordering NPWT, Apligraf, etc. 0 []  - Emergency Hospital Admission (emergent condition) 0 []  - Simple Discharge Coordination 0 []  - Complex (extensive) Discharge Coordination 0 PROCESS - Special Needs []  - Pediatric / Minor Patient Management 0 []  - Isolation Patient Management 0 []  - Hearing / Language / Visual special needs 0 []  - Assessment of Community assistance (transportation, D/C planning, etc.) 0 []  - Additional assistance / Altered mentation 0 []  - Support Surface(s) Assessment (bed, cushion, seat, etc.) 0 INTERVENTIONS -  Wound Cleansing / Measurement []  - Simple Wound Cleansing - one wound 0 []  - Complex Wound Cleansing - multiple wounds 0 []  - Wound Imaging (photographs - any number of wounds) 0 []  - Wound  Tracing (instead of photographs) 0 []  - Simple Wound Measurement - one wound 0 []  - Complex Wound Measurement - multiple wounds 0 INTERVENTIONS - Wound Dressings []  - Small Wound Dressing one or multiple wounds 0 []  - Medium Wound Dressing one or multiple wounds 0 []  - Large Wound Dressing one or multiple wounds 0 []  - Application of Medications - topical 0 []  - Application of Medications - injection 0 [name], [name] ([id number]) INTERVENTIONS - Miscellaneous []  - External ear exam 0 []  - Specimen Collection (cultures, biopsies, blood, body fluids, etc.) 0 []  - Specimen(s) / Culture(s) sent or taken to Lab for analysis 0 X - Patient Transfer (multiple staff / Nurse, adultHoyer Lift / Similar devices) 1 10 []  - Simple Staple / Suture removal (25 or less) 0 []  - Complex Staple / Suture removal (26 or more) 0 []  - Hypo / Hyperglycemic Management (close monitor of Blood Glucose) 0 []  - Ankle / Brachial Index (ABI) - do not check if billed separately 0 X - Vital Signs 1 5 Has the patient been seen at the hospital within the last three years: Yes Total Score: 50 Level Of Care: New/Established - Level 2 Electronic Signature(s) Signed: 07/15/2016 5:42:32 PM By: [name] BSN, RN Entered By: [name] on 07/15/2016 13:20:06 [name] NationsSILETZKY, [name] ([id number]) -------------------------------------------------------------------------------- Encounter Discharge Information Details Patient Name: [name] NationsSILETZKY, [name] 07/15/2016 12:45 Date of Service: PM Medical Record [id number] Number: Patient Account Number: [id number] 09/18/1921 (82 y.o. Treating RN: [name] Sitesorthy, Joanna Date of Birth/Sex: Female) Other Clinician: Primary Care Physician: [name] HartBRONSTEIN, [name] Treating [name], [name] Referring Physician: Dorothey BasemanBRONSTEIN,  [name] Physician/Extender: Weeks in Treatment: 17 Encounter Discharge Information Items Discharge Pain Level: 0 Discharge Condition: Stable Ambulatory Status: Wheelchair Discharge Destination: Home Transportation: Private Auto Accompanied By: dtr in law Schedule Follow-up Appointment: No Medication Reconciliation completed and provided to Patient/Care No Takashi Korol: Provided on Clinical Summary of Care: 07/15/2016 Form Type Recipient Paper Patient HS Electronic Signature(s) Signed: 07/15/2016 2:43:30 PM By: [name] Sitesorthy, Joanna Previous Signature: 07/15/2016 1:31:40 PM Version By: Gwenlyn PerkingMoore, Shelia Entered By: [name] Sitesorthy, Joanna on 07/15/2016 14:43:30 [name] NationsSILETZKY, [name] ([id number]) -------------------------------------------------------------------------------- Lower Extremity Assessment Details Patient Name: [name] NationsSILETZKY, [name] 07/15/2016 12:45 Date of Service: PM Medical Record [id number] Number: Patient Account Number: [id number] 12/22/1920 (82 y.o. Treating RN: [name] Sitesorthy, Joanna Date of Birth/Sex: Female) Other Clinician: Primary Care Physician: [name] HartBRONSTEIN, [name] Treating [name] Referring Physician: Dorothey BasemanBRONSTEIN, [name] Physician/Extender: [name] AdeWeeks in Treatment: 17 Edema Assessment Assessed: [Left: No] [Right: No] Edema: [Left: N] [Right: o] Vascular Assessment Pulses: Posterior Tibial Dorsalis Pedis Palpable: [Right:Yes] Extremity colors, hair growth, and conditions: Extremity Color: [Right:Hyperpigmented] Hair Growth on Extremity: [Right:Yes] Temperature of Extremity: [Right:Warm] Capillary Refill: [Right:< 3 seconds] Electronic Signature(s) Signed: 07/15/2016 5:26:42 PM By: [name] Sitesorthy, Joanna Entered By: [name] Sitesorthy, Joanna on 07/15/2016 13:06:52 [name] NationsSILETZKY, [name] ([id number]) -------------------------------------------------------------------------------- Pain Assessment Details Patient Name: [name] NationsSILETZKY, [name] 07/15/2016 12:45 Date of Service: PM Medical Record [id number] Number: Patient  Account Number: [id number] 11/11/1920 (82 y.o. Treating RN: [name] Sitesorthy, Joanna Date of Birth/Sex: Female) Other Clinician: Primary Care Physician: [name] HartBRONSTEIN, [name] Treating [name] Referring Physician: Dorothey BasemanBRONSTEIN, [name] Physician/Extender: Weeks in Treatment: 17 Active Problems Location of Pain Severity and Description of Pain Patient Has Paino No Site Locations Pain Management and Medication Current Pain Management: Notes Topical or injectable lidocaine is offered to patient for acute pain when surgical debridement is performed. If needed, Patient is instructed to use over the counter pain medication for the following 24-48 hours after debridement. Wound care MDs do not prescribed pain medications. Patient has chronic pain or uncontrolled pain. Patient  has been instructed to make an appointment with their Primary Care Physician for pain management. Electronic Signature(s) Signed: 07/15/2016 5:26:42 PM By: [name] Entered By: [name] on 07/15/2016 13:05:32 [name] Nations ([id number]) -------------------------------------------------------------------------------- Patient/Caregiver Education Details Patient Name: [name], [name] 07/15/2016 12:45 Date of Service: PM Medical Record [id number] Number: Patient Account Number: [id number] 02/14/1921 (82 y.o. Treating RN: [name] Date of Birth/Gender: Female) Other Clinician: Primary Care Physician: [name] Treating [name] Kanner Referring Physician: Dorothey [name] Physician/Extender: [name] in Treatment: 17 Education Assessment Education Provided To: Patient and Caregiver Education Topics Provided Basic Hygiene: Handouts: Other: continue skin care as you have been Methods: Explain/Verbal Responses: State content correctly Electronic Signature(s) Signed: 07/15/2016 5:26:42 PM By: [name] Entered By: [name] on 07/15/2016 14:44:01 [name] Nations  ([id number]) -------------------------------------------------------------------------------- Vitals Details Patient Name: [name], [name] 07/15/2016 12:45 Date of Service: PM Medical Record [id number] Number: Patient Account Number: [id number] 1920-11-08 (82 y.o. Treating RN: [name] Date of Birth/Sex: Female) Other Clinician: Primary Care Physician: [name] Treating [name] Referring Physician: Dorothey [name] Physician/Extender: Weeks in Treatment: 17 Vital Signs Time Taken: 13:05 Temperature (F): 97.8 Pulse (bpm): 47 Respiratory Rate (breaths/min): 16 Blood Pressure (mmHg): 91/29 Reference Range: 80 - 120 mg / dl Notes MD notified, 62/95 on recheck Electronic Signature(s) Signed: 07/15/2016 1:33:11 PM By: [name] Entered By: [name] on 07/15/2016 13:33:11

## 2016-07-16 NOTE — Progress Notes (Signed)
Midg<MEASelect [facility]ty Hospital [facility] Battle CMidge Pharmacist, [name]. 02/25/2015 -- we have received notes from the vascular group who saw her last on 02/13/2015 and she was seen by the PA Ms. [name]. She haOlando Va Medical Centerecommended that theEndoscopy Center Of Pennsylania Hospitalpatient continue to follow [id number] <MEOAthens [id number] y [id number] r<BAD09.8EXTTAG> 111.9.9rt St. SaSartori Memorial Hospitalke Murray [facility] LLCdoscopy 7mi93mTery SanfilippKendrick Friesidge D .[id number] info09.8mation <[id number] re ma09.8agLoma L11.9nda UniversGrove City Medical Center [facility] Center-MurrLangley Holdings LLCetai tyAlleen [id number] io94809.8[place] 11.9t.SaberuntVa [id number] thcar09.8 Syst 11.9 [id number] llbro09.8k Hosp 11.9strict SkiKosair Children'S [id number] Nursi09.8g Faci i11.9yineTexas Peacehealth United [facility] Hospitaleral [facility] Re iPalm [id number] ASURE09.8ENT>s M 11.909.8cal CeRegina Medical CenterDTEXTTAG>t11.9rnSt Peters Ascl MedicSundanOThe Center For Digestive And Liver H alth And [id number] copy 09.8enterli 11.9SaberaAtlanta Va Health Medical CenternResurgens East Surgery C<BThe [facility] L [id number] C098109.[address]765[facility] Centerrovidence [place] ISt Vincent [facility] Hospital Incland M dical [id number] copy 09.8enter C<BADTEXHenry Ford Wyandotte HospitalG>a11.9ylio SabertTahoe Republic County Hospitalaci ic HospitOArcadia O tpatient [id number] enter09.8LPlio 1 911.95815mTery SRedingtonDesert [facility] Centerir[facility] LPADTEXTTAG>[id number] Advanced Surgery C nterlio [id number] W. Un09.8versit [facility]9r.NT>s St.[facility] M<BADTEXTSelect Specialty Hospital - Sioux FallsAG>dical [id number]  wound care including management of the left BKA stump, which has had dehiscence of the lateral part. They will consider repeating a arterial duplex or angiogram if the right lower extremity does not heal within a reasonable period of time. 03/18/2015 -- he saw the vascular surgeon Dr. Gilda Crease and he has set her up for an angioplasty sometime in the middle of July. she is doing well otherwise. [name], [name] ([id number]) 04/10/2015 -- the patient had a procedure done on 04/08/2015 and this was a right angioplasty of the dorsalis pedis, anterior tibial artery, first superficial femoral artery in its midportion. There was successful intervention with recanalization and in-line flow to the right foot. 04/22/2015 -- the patient was looking rather pale today and the daughter confirms that her hemoglobin was down to 6.9. she is being monitored by her PCP. Her Lasix dose was increased and her edema has gone down significantly. 04/29/2015 -- she had vascular studies done and the ABI on the right is 1.3 and the toe pressures were within normal limits. Her hemoglobin is still around 7 and she refuses to take any blood transfusions for religious reasons. Her potassium was normal. 06/10/2015 -- she has a new blister on her right lateral and posterior part of her leg just in the region where the Kerlix bandages were applied and this may be due to an abrasion. 06/24/2015 -- On her right lower extremity she has developed several more blisters which look like small pustules and the drain informed shallow ulcerations. 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 07/01/2015 -- she has an appointment with the PCP this Friday and her vascular surgeon on Monday. Other than that she is on doxycycline and has finished 1 week of treatment. The pustules she had all over have now resolved. 07/08/2015 -- she saw the vascular surgeon who did studies in the office and found that there is poor circulation in the distal lower  right leg and has set her up for an angiogram and possible stenting next Tuesday. 07/22/2015 -- on October 18 she was taken up for a successful revascularization of the anterior tibial vessel on the right side. a PTA and stent placement was done to the right anterior tibial artery. 08/19/2015 -- she has broken out with some linear ulceration in the region of her webspaces of her toes and this is something new. 08/26/2015 -- over the last 3 days there was a streak of redness going from her lower extremity towards her toes but she had no fever or discharge from the wounds. 09/02/2015 -- the redness and cellulitis has gone down but she continues to have a lot of edema of the right lower extremity. 10/28/2015 -- over the last week she has been draining a lot of fluid from her right lower extremities and several of the wounds which had healed in the past and now opened up again 11/06/2015 -- she was seen in the vascular office and the right ABI was more than 1.3 which was consistent with noncompressible arteries due to medial calcification and the right great toe pressure was 0.24 and the PPG waveform showed significant decrease. from talking to the family member I believe no procedure has been planned. She was also seen by her PCP was increased her Lasix to 80 mg a day and has ordered some lab work but these reports are not back. 11/20/2015 -- Recent labs done hemoglobin A1c was 6.2, INR was .93, iron-binding capacity was 310, BMP was within normal limits except for the BUN which  was 40 and albumin was 3.1. Her hemoglobin is up to 10.7 with a hematocrit of 34.2 he was advised lymphedema pumps by Dr. Gilda Crease and she has not been wearing these. Her daughter-in- law also says that she has been very noncompliant about elevation of her limbs. 12/18/2015 -- she was seen by Dr. Gilda Crease who agreed with the management and did not think she required any intervention. He discussed elevation and use of lymph  pump and will see her back as required. [name], [name] ([id number]) 01/15/2016 -- we have been seeing help me every other week and from what I understand from the family member she is quite stubborn and noncompliant and does not use any elevation of follow the advice saying most medications and applications are causing her pain. 03/12/2016 -- was admitted to the hospital on 02/03/2016 with a right lower extremity cellulitis. She was put on vancomycin and Azactam and metronidazole. She was seen by Dr. Wyn Quaker who performed a aortogram and selective right lower extremity angiogram with a angioplasty of the proximal right anterior tibial artery and right posterior tibial artery and peroneal artery. She was also seen by Dr. Sampson Goon of infectious disease who recommended changing over to ciprofloxacin 500 mg by mouth twice a day for 7 more days and doxycycline 100 mg by mouth twice a day for 7 more days. She had 9 days of IV antibiotics including vancomycin, aztreonam and Flagyl and the wound was improving. He recommended that she follow-up with me at the wound center and if her leg was worsening repeat cultures could be done and he would make appropriate changes to the antibiotics. She was discharged home on 02/13/2016 with oral ciprofloxacin and doxycycline. She was seen in follow-up on 02/27/2016 at the vascular office for a postop visit status post right lower extremity angiogram and the postprocedure course was found to be uneventful. He was told to come back in a month for an ABI and right lower extremity duplex study to assess her PAD. of note recent x-rays done on April 27 and May 17 did not show any osteomyelitis of the second toe x-ray of the right foot -- IMPRESSION:Findings compatible with cellulitis of the second toe. No objective evidence of osteomyelitis but there is severe diffuse osteopenia which limits sensitivity of the study. 04/15/2016 -- agent has now got dependent edema and has  been oozing fluid from the lower extremity and from the right foot. He is rather stubborn about not elevating her leg. 04/29/2016 -- she was seen in the vascular office and at that time had a significant cellulitis and was put on oral doxycycline for 2 weeks. This seems to have helped her immensely and she was also wrapped with an wound Unna's boots. 05/20/2016 -- she did very well with tri-tech Ultrafoam but ran out of this and used a different alginate and this kept a lot of moisture in the wound 07/15/2016 -- the home health nurse was concerned about some drainage in the right forefoot and hence asked them to come for review. she has been recently discharged from the wound clinic 3 weeks ago. Electronic Signature(s) Signed: 07/15/2016 1:27:39 PM By: Evlyn Kanner MD, FACS Entered By: Evlyn Kanner on 07/15/2016 13:27:39 [name] ([id number]) -------------------------------------------------------------------------------- Physical Exam Details Patient Name: [name], [name] 07/15/2016 12:45 Date of Service: PM Medical Record [id number] Number: Patient Account Number: [id number] 1921/03/14 (82 y.o. Treating RN: Curtis Sites Date of Birth/Sex: Female) Other Clinician: Primary Care Physician: Terance Hart, DAVID Treating Evlyn Kanner Referring Physician: Dorothey Baseman  Physician/Extender: Weeks in Treatment: 17 Constitutional . Pulse regular. Respirations normal and unlabored. Afebrile. . Eyes Nonicteric. Reactive to light. Ears, Nose, Mouth, and Throat Lips, teeth, and gums WNL.Marland Kitchen Moist mucosa without lesions. Neck supple and nontender. No palpable supraclavicular or cervical adenopathy. Normal sized without goiter. Respiratory WNL. No retractions.. Breath sounds WNL, No rubs, rales, rhonchi, or wheeze.. Cardiovascular Heart rhythm and rate regular, no murmur or gallop.. Pedal Pulses WNL. No clubbing, cyanosis or edema. Chest Breasts symmetical and no nipple discharge..  Breast tissue WNL, no masses, lumps, or tenderness.. Lymphatic No adneopathy. No adenopathy. No adenopathy. Musculoskeletal Adexa without tenderness or enlargement.. Digits and nails w/o clubbing, cyanosis, infection, petechiae, ischemia, or inflammatory conditions.. Integumentary (Hair, Skin) No suspicious lesions. No crepitus or fluctuance. No peri-wound warmth or erythema. No masses.Marland Kitchen Psychiatric Judgement and insight Intact.. No evidence of depression, anxiety, or agitation.. Notes the right lower extremity continues to have minimal lymphedema and there is no evidence of open ulceration or discharge from any of her toes or webspaces. Electronic Signature(s) Signed: 07/15/2016 1:28:15 PM By: Evlyn Kanner MD, FACS Entered By: Evlyn Kanner on 07/15/2016 13:28:13 [name] ([id number]) -------------------------------------------------------------------------------- Physician Orders Details Patient Name: MALEIA, WEEMS 07/15/2016 12:45 Date of Service: PM Medical Record [id number] Number: Patient Account Number: [id number] 07/24/1921 (82 y.o. Treating RN: Clover Mealy, RN, BSN,  Sink Date of Birth/Sex: Female) Other Clinician: Primary Care Physician: Terance Hart, DAVID Treating Deisy Ozbun Referring Physician: Dorothey Baseman Physician/Extender: Tania Ade in Treatment: 5 Verbal / Phone Orders: Yes Clinician: Afful, RN, BSN, Rita Read Back and Verified: Yes Diagnosis Coding Discharge From [facility] Services o Discharge from Wound Care Center - Will continue supportive care. Patient to call and return with any new changes. Treatment Completed Electronic Signature(s) Signed: 07/15/2016 3:58:39 PM By: Evlyn Kanner MD, FACS Signed: 07/15/2016 5:42:32 PM By: Elpidio Eric BSN, RN Entered By: Elpidio Eric on 07/15/2016 13:19:20 [name] ([id number]) -------------------------------------------------------------------------------- Problem List Details Patient Name: [name], [name]  07/15/2016 12:45 Date of Service: PM Medical Record [id number] Number: Patient Account Number: [id number] 12-27-1920 (82 y.o. 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 Treating RN: Curtis Sites Date of Birth/Sex: Female) Other Clinician: Primary Care Physician: Terance Hart, DAVID Treating Dayanara Sherrill Referring Physician: Dorothey Baseman Physician/Extender: Tania Ade in Treatment: 17 Subjective Chief Complaint Information obtained from Patient 82 year old patient who is very well-known to as returns after a recent admission to hospital and his prolonged stay in the nursing home. History of Present Illness (HPI) The following HPI elements were documented for the patient's wound: Location: right medial heel and right second toe Quality: Patient  reports experiencing a dull pain to affected area(s). Severity: Patient states  wound are getting worse. Duration: Patient has had the wound for > 6months prior to seeking treatment at the wound center Timing: Pain in wound is Intermittent (comes and goes Context: The wound appeared gradually over time Modifying Factors: Consults to this date include:vascular surgeon and several recent surgeries. A pleasant 82 year old patient who is very well-known to my practice was seen about a month and a half ago until she was admitted to hospital and is known to have diabetes mellitus for several years has recently gone through a series of operations with the vascular surgeon Dr. Gilda Crease. In January and February she had several surgeries on the left lower extremity with an attempt to have limb salvage for gangrenous changes of her forefoot. 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 On 01/07/2015 she also had a right lower extremity distal runoff with a angioplasty of the right anterior tibial artery to maximize her blood flow to the foot. She recently had her staples removed at Dr. Marijean Heath office on 01/31/2015 and at that time a right lower extremity duplex was done which showed patent vessels and a patent stent with distal occluded posterior tibial artery. Though the duplex was noncritical the recommendations from the PA at the vascular surgery office was that of a angiogram to be done but the patient said she would rather try some bone care before. The patient has received doxycycline and Cipro in the recent past and she takes oral medications for her diabetes. Other than that I reviewed her list of all her medications. No recent hemoglobin A1c has been done and no recent x-rays of the right foot have been done. 02/18/2015 -- x-ray of the right foot was done on 02/11/2015 and it shows no evidence of acute osteomyelitis of the  third toe or of the calcaneus. She had gone to the vascular surgery office and they had noted that there is dehiscence of the left part of the amputation site of the below-knee wound and they have asked [place] to kindly take over the care of this. There've been doing dressings for the right heel and right third toe. JASLYNNE, DAHAN ([id number]) 02/25/2015 -- we have received notes from the vascular group who saw her last on 02/13/2015 and she was seen by the PA Ms. Cleda Daub. She had recommended that the patient continue to follow with [place] for wound care including management of the left BKA stump, which has had dehiscence of the lateral part. They will consider repeating a arterial duplex or angiogram if the right lower extremity does not heal within a reasonable period of time. 03/18/2015 -- he saw the vascular surgeon Dr. Gilda Crease and he has set her up for an angioplasty sometime in the middle of July. she is doing well otherwise. 04/10/2015 -- the patient had a procedure done on 04/08/2015 and this was a right angioplasty of the dorsalis pedis, anterior tibial artery, first superficial femoral artery in its midportion. There was successful intervention with recanalization and in-line flow to the right foot. 04/22/2015 -- the patient was looking rather pale today and the daughter confirms that her hemoglobin was down to 6.9. she is being monitored by her PCP. Her Lasix dose was increased and her edema has gone down significantly. 04/29/2015 -- she had vascular studies done and the ABI on the right is 1.3 and the toe pressures were within normal limits. Her hemoglobin is still around 7 and she refuses to take  any blood transfusions for religious reasons. Her potassium was normal. 06/10/2015 -- she has a new blister on her right lateral and posterior part of her leg just in the region where the Kerlix bandages were applied and this may be due to an abrasion. 06/24/2015 -- On her right lower  extremity she has developed several more blisters which look like small pustules and the drain informed shallow ulcerations. 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 07/01/2015 -- she has an appointment with the PCP this Friday and her vascular surgeon on Monday. Other than that she is on doxycycline and has finished 1 week of treatment. The pustules she had all over have now resolved. 07/08/2015 -- she saw the vascular surgeon who did studies in the office and found that there is poor circulation in the distal lower right leg and has set her up for an angiogram and possible stenting next Tuesday. 07/22/2015 -- on October 18 she was taken up for a successful revascularization of the anterior tibial vessel on the right side. a PTA and stent placement was done to the right anterior tibial artery. 08/19/2015 -- she has broken out with some linear ulceration in the region of her webspaces of her toes and this is something new. 08/26/2015 -- over the last 3 days there was a streak of redness going from her lower extremity towards her toes but she had no fever or discharge from the wounds. 09/02/2015 -- the redness and cellulitis has gone down but she continues to have a lot of edema of the right lower extremity. 10/28/2015 -- over the last week she has been draining a lot of fluid from her right lower extremities and several of the wounds which had healed in the past and now opened up again 11/06/2015 -- she was seen in the vascular office and the right ABI was more than 1.3 which was consistent with noncompressible arteries due to medial calcification and the right great toe pressure was 0.24 and the PPG waveform showed significant decrease. from talking to the family member I believe no procedure has been planned. She was also seen by her PCP was increased her Lasix to 80 mg a day and has ordered some lab work but these reports are not back. [name], [name] ([id number]) 11/20/2015 -- Recent labs done  hemoglobin A1c was 6.2, INR was .93, iron-binding capacity was 310, BMP was within normal limits except for the BUN which was 40 and albumin was 3.1. Her hemoglobin is up to 10.7 with a hematocrit of 34.2 he was advised lymphedema pumps by Dr. Gilda Crease and she has not been wearing these. Her daughter-in- law also says that she has been very noncompliant about elevation of her limbs. 12/18/2015 -- she was seen by Dr. Gilda Crease who agreed with the management and did not think she required any intervention. He discussed elevation and use of lymph pump and will see her back as required. 01/15/2016 -- we have been seeing help me every other week and from what I understand from the family member she is quite stubborn and noncompliant and does not use any elevation of follow the advice saying most medications and applications are causing her pain. 03/12/2016 -- was admitted to the hospital on 02/03/2016 with a right lower extremity cellulitis. She was put on vancomycin and Azactam and metronidazole. She was seen by Dr. Wyn Quaker who performed a aortogram and selective right lower extremity angiogram with a angioplasty of the proximal right anterior tibial artery and right posterior tibial artery  and peroneal artery. She was also seen by Dr. Sampson Goon of infectious disease who recommended changing over to ciprofloxacin 500 mg by mouth twice a day for 7 more days and doxycycline 100 mg by mouth twice a day for 7 more days. She had 9 days of IV antibiotics including vancomycin, aztreonam and Flagyl and the wound was improving. He recommended that she follow-up with me at the wound center and if her leg was worsening repeat cultures could be done and he would make appropriate changes to the antibiotics. She was discharged home on 02/13/2016 with oral ciprofloxacin and doxycycline. She was seen in follow-up on 02/27/2016 at the vascular office for a postop visit status post right lower extremity angiogram and the  postprocedure course was found to be uneventful. He was told to come back in a month for an ABI and right lower extremity duplex study to assess her PAD. of note recent x-rays done on April 27 and May 17 did not show any osteomyelitis of the second toe x-ray of the right foot -- IMPRESSION:Findings compatible with cellulitis of the second toe. No objective evidence of osteomyelitis but there is severe diffuse osteopenia which limits sensitivity of the study. 04/15/2016 -- agent has now got dependent edema and has been oozing fluid from the lower extremity and from the right foot. He is rather stubborn about not elevating her leg. 04/29/2016 -- she was seen in the vascular office and at that time had a significant cellulitis and was put on oral doxycycline for 2 weeks. This seems to have helped her immensely and she was also wrapped with an wound Unna's boots. 05/20/2016 -- she did very well with tri-tech Ultrafoam but ran out of this and used a different alginate and this kept a lot of moisture in the wound 07/15/2016 -- the home health nurse was concerned about some drainage in the right forefoot and hence asked them to come for review. she has been recently discharged from the wound clinic 3 weeks ago. Objective Constitutional [name], [name] ([id number]) Pulse regular. Respirations normal and unlabored. Afebrile. Vitals Time Taken: 1:05 PM, Temperature: 97.8 F, Pulse: 47 bpm, Respiratory Rate: 16 breaths/min, Blood Pressure: 91/29 mmHg. General Notes: MD notified, 98/44 on recheck Eyes Nonicteric. Reactive to light. Ears, Nose, Mouth, and Throat Lips, teeth, and gums WNL.Marland Kitchen Moist mucosa without lesions. Neck supple and nontender. No palpable supraclavicular or cervical adenopathy. Normal sized without goiter. Respiratory WNL. No retractions.. Breath sounds WNL, No rubs, rales, rhonchi, or wheeze.. Cardiovascular Heart rhythm and rate regular, no murmur or gallop.. Pedal Pulses WNL.  No clubbing, cyanosis or edema. Chest Breasts symmetical and no nipple discharge.. Breast tissue WNL, no masses, lumps, or tenderness.. Lymphatic No adneopathy. No adenopathy. No adenopathy. Musculoskeletal Adexa without tenderness or enlargement.. Digits and nails w/o clubbing, cyanosis, infection, petechiae, ischemia, or inflammatory conditions.Marland Kitchen Psychiatric Judgement and insight Intact.. No evidence of depression, anxiety, or agitation.. General Notes: the right lower extremity continues to have minimal lymphedema and there is no evidence of open ulceration or discharge from any of her toes or webspaces. Integumentary (Hair, Skin) No suspicious lesions. No crepitus or fluctuance. No peri-wound warmth or erythema. No masses.. 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 Patient to call and return with any new changes. Treatment Completed Symptomatic treatment has been recommended and I have asked them to use mild compression and occasional application of silver alginate if there is some discharge between the webspaces. she can see is back on a when necessary basis. Electronic Signature(s) Signed: 07/15/2016 4:01:29 PM By: Evlyn KannerBritto, Fitzhugh Vizcarrondo MD, FACS Previous Signature: 07/15/2016 1:29:10 PM Version By: Evlyn KannerBritto, Maiko Salais MD, FACS Entered By: Evlyn KannerBritto, Charleigh Correnti on 07/15/2016 16:01:29 [name], [name] ([id number]) -------------------------------------------------------------------------------- SuperBill  Details Patient Name: [name], [name] Date of Service: 07/15/2016 Medical Record Number: [id number] Patient Account Number: [id number] Date of Birth/Sex: 12/30/1920 (82 y.o. 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 L97.812 Non-pressure chronic ulcer of other part of right lower leg with fat layer exposed Facility Procedures CPT4 Code: [id number] Description: [id number] - WOUND CARE VISIT-LEV 2 EST PT Modifier: Quantity: 1 Physician Procedures CPT4: Description Modifier Quantity Code [id number] 99212 - WC PHYS LEVEL 2 - EST PT 1 ICD-10 Description Diagnosis E11.622 Type 2 diabetes mellitus with other skin ulcer 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  L97.812 Non-pressure chronic ulcer of other part of right lower leg with fat layer exposed Electronic Signature(s) Signed: 07/15/2016 1:29:28 PM By: Evlyn KannerBritto, Tyshauna Finkbiner MD, FACS Entered By: Evlyn KannerBritto, Waymond Meador on 07/15/2016 10:27:2513:29:28

## 2016-12-13 ENCOUNTER — Ambulatory Visit (INDEPENDENT_AMBULATORY_CARE_PROVIDER_SITE_OTHER): Payer: Medicare Other | Admitting: Vascular Surgery

## 2016-12-13 VITALS — BP 142/72 | HR 63 | Resp 16 | Ht 65.0 in | Wt 172.0 lb

## 2016-12-13 DIAGNOSIS — I4891 Unspecified atrial fibrillation: Secondary | ICD-10-CM

## 2016-12-13 DIAGNOSIS — I10 Essential (primary) hypertension: Secondary | ICD-10-CM | POA: Diagnosis not present

## 2016-12-13 DIAGNOSIS — I739 Peripheral vascular disease, unspecified: Secondary | ICD-10-CM

## 2016-12-15 NOTE — Progress Notes (Signed)
MRN : [id number]  [name] is a 83 y.o. (02/05/1921) female who presents with chief complaint of  Chief Complaint  Patient presents with  . Re-evaluation    6 month follow up  .  History of Present Illness: The patient returns to the office for followup and review of the noninvasive studies. There have been no interval changes in lower extremity symptoms. No interval shortening of the patient's claudication distance or development of rest pain symptoms. No new ulcers or wounds have occurred since the last visit.  There have been no significant changes to the patient's overall health care.  The patient denies amaurosis fugax or recent TIA symptoms. There are no recent neurological changes noted. The patient denies history of DVT, PE or superficial thrombophlebitis. The patient denies recent episodes of angina or shortness of breath.    Current Meds  Medication Sig  . apixaban (ELIQUIS) 5 MG TABS tablet Take 5 mg by mouth 2 (two) times daily.   . Cholecalciferol (VITAMIN D3) 5000 units CAPS Take 1 capsule by mouth daily.  . ciprofloxacin (CIPRO) 500 MG tablet Take 1 tablet (500 mg total) by mouth 2 (two) times daily.  . clotrimazole-betamethasone (LOTRISONE) cream Apply 1 application topically daily.  . Copper Gluconate 2 MG TABS Take 2 mg by mouth 1 day or 1 dose.  . digoxin (LANOXIN) 0.125 MG tablet Take 0.0625 mg by mouth daily.   Marland Kitchen. docusate sodium (COLACE) 100 MG capsule Take 100 mg by mouth 2 (two) times daily as needed.   . feeding supplement, ENSURE ENLIVE, (ENSURE ENLIVE) LIQD Take 237 mLs by mouth 2 (two) times daily between meals.  . ferrous sulfate 325 (65 FE) MG tablet Take 325 mg by mouth daily.   . furosemide (LASIX) 20 MG tablet Take 40 mg by mouth 2 (two) times daily.   Marland Kitchen. lisinopril (PRINIVIL,ZESTRIL) 10 MG tablet Take by mouth.  . metoprolol (LOPRESSOR) 50 MG tablet Take 1 tablet (50 mg total) by mouth 2 (two) times daily.  . minocycline (MINOCIN,DYNACIN) 100  MG capsule Take 1 capsule (100 mg total) by mouth 2 (two) times daily.  Marland Kitchen. nystatin (MYCOSTATIN) 100000 UNIT/ML suspension Use as directed 5 mLs (500,000 Units total) in the mouth or throat 4 (four) times daily.  Marland Kitchen. nystatin (NYSTATIN) powder Apply topically 3 (three) times daily.  Marland Kitchen. oxyCODONE (OXY IR/ROXICODONE) 5 MG immediate release tablet Take 1 tablet (5 mg total) by mouth every 6 (six) hours as needed for moderate pain or severe pain.  . potassium chloride (K-DUR) 10 MEQ tablet Take 10 mEq by mouth daily.  Marland Kitchen. pyridOXINE (VITAMIN B-6) 100 MG tablet Take 100 mg by mouth daily.  . quinapril (ACCUPRIL) 10 MG tablet Take 10 mg by mouth daily.  Marland Kitchen. selenium 50 MCG TABS tablet Take 200 mcg by mouth daily.  . vitamin B-12 (CYANOCOBALAMIN) 1000 MCG tablet Take 1,000 mcg by mouth daily.  Marland Kitchen. zinc gluconate 50 MG tablet Take 50 mg by mouth daily.    Past Medical History:  Diagnosis Date  . Atrial fibrillation (HCC)   . CHF (congestive heart failure) (HCC)   . Diabetes mellitus without complication (HCC)   . Dysrhythmia   . Foot ulcer, right (HCC)   . Gangrene of toe (HCC)   . Gout   . Hypertension   . Peripheral vascular disease [facility](HCC)     Past Surgical History:  Procedure Laterality Date  . EYE SURGERY    . JOINT REPLACEMENT    .  LEG AMPUTATION THROUGH KNEE Left   . PERIPHERAL VASCULAR CATHETERIZATION Right 04/08/2015   Procedure: Lower Extremity Angiography;  Surgeon: Renford Dills, MD;  Location: [facility] INVASIVE CV LAB;  Service: Cardiovascular;  Laterality: Right;  . PERIPHERAL VASCULAR CATHETERIZATION  04/08/2015   Procedure: Lower Extremity Intervention;  Surgeon: Renford Dills, MD;  Location: [facility] INVASIVE CV LAB;  Service: Cardiovascular;;  . PERIPHERAL VASCULAR CATHETERIZATION N/A 07/15/2015   Procedure: Abdominal Aortogram w/Lower Extremity;  Surgeon: Renford Dills, MD;  Location: [facility] INVASIVE CV LAB;  Service: Cardiovascular;  Laterality: N/A;  . PERIPHERAL VASCULAR  CATHETERIZATION  07/15/2015   Procedure: Lower Extremity Intervention;  Surgeon: Renford Dills, MD;  Location: [facility] INVASIVE CV LAB;  Service: Cardiovascular;;  . PERIPHERAL VASCULAR CATHETERIZATION Right 02/09/2016   Procedure: Lower Extremity Angiography;  Surgeon: Annice Needy, MD;  Location: [facility] INVASIVE CV LAB;  Service: Cardiovascular;  Laterality: Right;  . PERIPHERAL VASCULAR CATHETERIZATION  02/09/2016   Procedure: Lower Extremity Intervention;  Surgeon: Annice Needy, MD;  Location: [facility] INVASIVE CV LAB;  Service: Cardiovascular;;  . Right Knee replacement      Social History Social History  Substance Use Topics  . Smoking status: Former Smoker    Quit date: 12/02/1954  . Smokeless tobacco: Not on file  . Alcohol use No    Family History No family history on file. No family history of bleeding/clotting disorders, porphyria or autoimmune disease  Allergies  Allergen Reactions  . Amoxicillin   . Cephalexin     Other reaction(s): Unknown  . Prednisolone   . Prednisone   . Toradol [Ketorolac Tromethamine]   . Tramadol      REVIEW OF SYSTEMS (Negative unless checked)  Constitutional: [] Weight loss  [] Fever  [] Chills Cardiac: [] Chest pain   [] Chest pressure   [] Palpitations   [] Shortness of breath when laying flat   [] Shortness of breath with exertion. Vascular:  [] Pain in legs with walking   [x] Pain in legs at rest  [] History of DVT   [] Phlebitis   [x] Swelling in legs   [] Varicose veins   [] Non-healing ulcers Pulmonary:   [] Uses home oxygen   [] Productive cough   [] Hemoptysis   [] Wheeze  [] COPD   [] Asthma Neurologic:  [] Dizziness   [] Seizures   [] History of stroke   [] History of TIA  [] Aphasia   [] Vissual changes   [] Weakness or numbness in arm   [] Weakness or numbness in leg Musculoskeletal:   [] Joint swelling   [] Joint pain   [] Low back pain Hematologic:  [] Easy bruising  [] Easy bleeding   [] Hypercoagulable state   [] Anemic Gastrointestinal:  [] Diarrhea   [] Vomiting   [] Gastroesophageal reflux/heartburn   [] Difficulty swallowing. Genitourinary:  [] Chronic kidney disease   [] Difficult urination  [] Frequent urination   [] Blood in urine Skin:  [] Rashes   [] Ulcers  Psychological:  [] History of anxiety   []  History of major depression.  Physical Examination  Vitals:   12/13/16 1329  BP: (!) 142/72  Pulse: 63  Resp: 16  Weight: 78 kg (172 lb)  Height: 5\' 5"  (1.651 m)   Body mass index is 28.62 kg/m. Gen: WD/WN, NAD Head: [place]/AT, No temporalis wasting.  Ear/Nose/Throat: Hearing grossly intact, nares w/o erythema or drainage, poor dentition Eyes: PER, EOMI, sclera nonicteric.  Neck: Supple, no masses.  No bruit or JVD.  Pulmonary:  Good air movement, clear to auscultation bilaterally, no use of accessory muscles.  Cardiac: RRR, normal S1, S2, no Murmurs. Vascular: all wounds healed right foot healed  left stump is intact Vessel Right Left  Radial Palpable Palpable  Ulnar Palpable Palpable  Brachial Palpable Palpable  Carotid Palpable Palpable  Femoral Palpable Palpable  Popliteal Not Palpable BKA  PT Not Palpable BKA  DP Not Palpable BKA   Gastrointestinal: soft, non-distended. No guarding/no peritoneal signs.  Musculoskeletal: M/S 5/5 throughout.  No atrophy. Left BKA Neurologic: CN 2-12 intact. Pain and light touch intact in extremities.  Symmetrical.  Speech is fluent. Motor exam as listed above. Psychiatric: Judgment intact, Mood & affect appropriate for pt's clinical situation. Dermatologic: No rashes or ulcers noted.  No changes consistent with cellulitis. Lymph : No Cervical lymphadenopathy, no lichenification or skin changes of chronic lymphedema.  CBC Lab Results  Component Value Date   WBC 13.7 (H) 02/10/2016   HGB 9.7 (L) 02/10/2016   HCT 28.8 (L) 02/10/2016   MCV 91.1 02/10/2016   PLT 444 (H) 02/10/2016    BMET    Component Value Date/Time   NA 133 (L) 02/09/2016 0443   NA 138 01/05/2015 0607   K 4.1 02/09/2016 0443   K  4.0 01/05/2015 0607   CL 98 (L) 02/09/2016 0443   CL 103 01/05/2015 0607   CO2 28 02/09/2016 0443   CO2 31 01/05/2015 0607   GLUCOSE 99 02/09/2016 0443   GLUCOSE 104 (H) 01/05/2015 0607   BUN 23 (H) 02/09/2016 0443   BUN 15 01/05/2015 0607   CREATININE 0.56 02/11/2016 0407   CREATININE 0.63 01/05/2015 0607   CALCIUM 8.2 (L) 02/09/2016 0443   CALCIUM 8.0 (L) 01/05/2015 0607   GFRNONAA >60 02/11/2016 0407   GFRNONAA >60 01/05/2015 0607   GFRAA >60 02/11/2016 0407   GFRAA >60 01/05/2015 0607   CrCl cannot be calculated (Patient's most recent lab result is older than the maximum 21 days allowed.).  COAG Lab Results  Component Value Date   INR 2.03 02/03/2016   INR 1.3 12/26/2014   INR 1.3 10/25/2014    Radiology No results found.  Assessment/Plan 1. PVD (peripheral vascular disease) (HCC)  Recommend:  The patient has evidence of atherosclerosis of the lower extremities with claudication.  The patient does not voice lifestyle limiting changes at this point in time.  No invasive studies, angiography or surgery at this time The patient should continue walking and begin a more formal exercise program.  The patient should continue antiplatelet therapy and aggressive treatment of the lipid abnormalities  No changes in the patient's medications at this time  The patient should continue wearing graduated compression socks 10-15 mmHg strength to control the mild edema.   - VAS [place] ABI WITH/WO TBI; Future  2. Essential hypertension Continue antihypertensive medications as already ordered, these medications have been reviewed and there are no changes at this time.   3. Atrial fibrillation, unspecified type (HCC) Continue antiarrhythmia medications as already ordered, these medications have been reviewed and there are no changes at this time.  Continue anticoagulation as ordered by Cardiology Service  Levora Dredge, MD  12/15/2016 10:16 PM

## 2017-01-19 ENCOUNTER — Emergency Department: Payer: Medicare Other

## 2017-01-19 ENCOUNTER — Inpatient Hospital Stay
Admission: EM | Admit: 2017-01-19 | Discharge: 2017-01-22 | DRG: 065 | Disposition: A | Discharge status: Skilled Nursing Facility | Payer: Medicare Other | Attending: Internal Medicine | Admitting: Internal Medicine

## 2017-01-19 ENCOUNTER — Encounter: Payer: Self-pay | Admitting: Emergency Medicine

## 2017-01-19 ENCOUNTER — Inpatient Hospital Stay (HOSPITAL_COMMUNITY)
Admit: 2017-01-19 | Discharge: 2017-01-19 | Disposition: A | Discharge status: Home / Self Care | Payer: Medicare Other | Attending: Internal Medicine | Admitting: Internal Medicine

## 2017-01-19 DIAGNOSIS — M109 Gout, unspecified: Secondary | ICD-10-CM | POA: Diagnosis present

## 2017-01-19 DIAGNOSIS — I110 Hypertensive heart disease with heart failure: Secondary | ICD-10-CM | POA: Diagnosis present

## 2017-01-19 DIAGNOSIS — R1310 Dysphagia, unspecified: Secondary | ICD-10-CM | POA: Diagnosis present

## 2017-01-19 DIAGNOSIS — Z66 Do not resuscitate: Secondary | ICD-10-CM | POA: Diagnosis present

## 2017-01-19 DIAGNOSIS — Z96651 Presence of right artificial knee joint: Secondary | ICD-10-CM | POA: Diagnosis present

## 2017-01-19 DIAGNOSIS — R29810 Facial weakness: Secondary | ICD-10-CM | POA: Diagnosis present

## 2017-01-19 DIAGNOSIS — I480 Paroxysmal atrial fibrillation: Secondary | ICD-10-CM | POA: Diagnosis present

## 2017-01-19 DIAGNOSIS — E1151 Type 2 diabetes mellitus with diabetic peripheral angiopathy without gangrene: Secondary | ICD-10-CM | POA: Diagnosis present

## 2017-01-19 DIAGNOSIS — R471 Dysarthria and anarthria: Secondary | ICD-10-CM | POA: Diagnosis present

## 2017-01-19 DIAGNOSIS — Z87891 Personal history of nicotine dependence: Secondary | ICD-10-CM

## 2017-01-19 DIAGNOSIS — Z7901 Long term (current) use of anticoagulants: Secondary | ICD-10-CM

## 2017-01-19 DIAGNOSIS — I639 Cerebral infarction, unspecified: Secondary | ICD-10-CM

## 2017-01-19 DIAGNOSIS — D509 Iron deficiency anemia, unspecified: Secondary | ICD-10-CM | POA: Diagnosis present

## 2017-01-19 DIAGNOSIS — I5032 Chronic diastolic (congestive) heart failure: Secondary | ICD-10-CM | POA: Diagnosis present

## 2017-01-19 DIAGNOSIS — I63431 Cerebral infarction due to embolism of right posterior cerebral artery: Principal | ICD-10-CM | POA: Diagnosis present

## 2017-01-19 DIAGNOSIS — I6522 Occlusion and stenosis of left carotid artery: Secondary | ICD-10-CM | POA: Diagnosis present

## 2017-01-19 DIAGNOSIS — G8191 Hemiplegia, unspecified affecting right dominant side: Secondary | ICD-10-CM | POA: Diagnosis present

## 2017-01-19 DIAGNOSIS — I63319 Cerebral infarction due to thrombosis of unspecified middle cerebral artery: Secondary | ICD-10-CM | POA: Diagnosis not present

## 2017-01-19 DIAGNOSIS — G458 Other transient cerebral ischemic attacks and related syndromes: Secondary | ICD-10-CM

## 2017-01-19 DIAGNOSIS — Z79899 Other long term (current) drug therapy: Secondary | ICD-10-CM

## 2017-01-19 LAB — COMPREHENSIVE METABOLIC PANEL
ALK PHOS: 63 U/L (ref 38–126)
ALT: 16 U/L (ref 14–54)
AST: 29 U/L (ref 15–41)
Albumin: 3.5 g/dL (ref 3.5–5.0)
Anion gap: 7 (ref 5–15)
BILIRUBIN TOTAL: 0.8 mg/dL (ref 0.3–1.2)
BUN: 21 mg/dL — AB (ref 6–20)
CHLORIDE: 103 mmol/L (ref 101–111)
CO2: 30 mmol/L (ref 22–32)
CREATININE: 0.72 mg/dL (ref 0.44–1.00)
Calcium: 9.2 mg/dL (ref 8.9–10.3)
GFR calc Af Amer: >60 mL/min (ref >60)
Glucose, Bld: 120 mg/dL — ABNORMAL HIGH (ref 65–99)
Potassium: 4.1 mmol/L (ref 3.5–5.1)
Sodium: 140 mmol/L (ref 135–145)
TOTAL PROTEIN: 6.4 g/dL — AB (ref 6.5–8.1)

## 2017-01-19 LAB — URINALYSIS, ROUTINE W REFLEX MICROSCOPIC
BILIRUBIN URINE: NEGATIVE
Glucose, UA: NEGATIVE mg/dL
HGB URINE DIPSTICK: NEGATIVE
Ketones, ur: NEGATIVE mg/dL
Leukocytes, UA: NEGATIVE
Nitrite: NEGATIVE
Protein, ur: NEGATIVE mg/dL
SPECIFIC GRAVITY, URINE: 1.006 (ref 1.005–1.030)
pH: 7 (ref 5.0–8.0)

## 2017-01-19 LAB — URINE DRUG SCREEN, QUALITATIVE (ARMC ONLY)
AMPHETAMINES, UR SCREEN: NOT DETECTED
BARBITURATES, UR SCREEN: NOT DETECTED
BENZODIAZEPINE, UR SCRN: NOT DETECTED
Cannabinoid 50 Ng, Ur ~~LOC~~: NOT DETECTED
Cocaine Metabolite,Ur ~~LOC~~: NOT DETECTED
MDMA (Ecstasy)Ur Screen: NOT DETECTED
METHADONE SCREEN, URINE: NOT DETECTED
OPIATE, UR SCREEN: NOT DETECTED
Phencyclidine (PCP) Ur S: NOT DETECTED
TRICYCLIC, UR SCREEN: NOT DETECTED

## 2017-01-19 LAB — CBC
HEMATOCRIT: 38.2 % (ref 35.0–47.0)
HEMATOCRIT: 39.6 % (ref 35.0–47.0)
HEMOGLOBIN: 13.1 g/dL (ref 12.0–16.0)
HEMOGLOBIN: 13.5 g/dL (ref 12.0–16.0)
MCH: 33.2 pg (ref 26.0–34.0)
MCH: 33.1 pg (ref 26.0–34.0)
MCHC: 34.2 g/dL (ref 32.0–36.0)
MCHC: 34.1 g/dL (ref 32.0–36.0)
MCV: 97.1 fL (ref 80.0–100.0)
MCV: 97 fL (ref 80.0–100.0)
Platelets: 235 10*3/uL (ref 150–440)
Platelets: 241 10*3/uL (ref 150–440)
RBC: 3.94 MIL/uL (ref 3.80–5.20)
RBC: 4.08 MIL/uL (ref 3.80–5.20)
RDW: 15.1 % — ABNORMAL HIGH (ref 11.5–14.5)
RDW: 15.2 % — ABNORMAL HIGH (ref 11.5–14.5)
WBC: 8 10*3/uL (ref 3.6–11.0)
WBC: 7.4 10*3/uL (ref 3.6–11.0)

## 2017-01-19 LAB — DIFFERENTIAL
BASOS ABS: 0.1 10*3/uL (ref 0–0.1)
Basophils Relative: 1 %
Eosinophils Absolute: 0.2 10*3/uL (ref 0–0.7)
Eosinophils Relative: 2 %
LYMPHS ABS: 2.1 10*3/uL (ref 1.0–3.6)
LYMPHS PCT: 26 %
MONOS PCT: 9 %
Monocytes Absolute: 0.7 10*3/uL (ref 0.2–0.9)
NEUTROS ABS: 5 10*3/uL (ref 1.4–6.5)
Neutrophils Relative %: 62 %

## 2017-01-19 LAB — APTT: APTT: 37 s — AB (ref 24–36)

## 2017-01-19 LAB — CREATININE, SERUM
CREATININE: 0.66 mg/dL (ref 0.44–1.00)
GFR calc Af Amer: >60 mL/min (ref >60)
GFR calc non Af Amer: >60 mL/min (ref >60)

## 2017-01-19 LAB — GLUCOSE, CAPILLARY
GLUCOSE-CAPILLARY: 124 mg/dL — AB (ref 65–99)
Glucose-Capillary: 97 mg/dL (ref 65–99)

## 2017-01-19 LAB — ETHANOL: Alcohol, Ethyl (B): <5 mg/dL (ref <5)

## 2017-01-19 LAB — TROPONIN I

## 2017-01-19 LAB — PROTIME-INR
INR: 1.11
Prothrombin Time: 14.3 seconds (ref 11.4–15.2)

## 2017-01-19 MED ORDER — SELENIUM 50 MCG PO TABS
200.0000 ug | ORAL_TABLET | Freq: Every day | ORAL | Status: DC
  Administered 2017-01-20 – 2017-01-22 (×3): 200 ug via ORAL
  Filled 2017-01-19 (×4): qty 4

## 2017-01-19 MED ORDER — VITAMIN D 1000 UNITS PO TABS
5000.0000 [IU] | ORAL_TABLET | Freq: Every day | ORAL | Status: DC
Start: 2017-01-19 — End: 2017-01-22
  Administered 2017-01-20 – 2017-01-22 (×3): 5000 [IU] via ORAL
  Filled 2017-01-19 (×3): qty 5

## 2017-01-19 MED ORDER — ENSURE ENLIVE PO LIQD
237.0000 mL | Freq: Two times a day (BID) | ORAL | Status: DC
  Administered 2017-01-19 – 2017-01-22 (×5): 237 mL via ORAL

## 2017-01-19 MED ORDER — NYSTATIN 100000 UNIT/ML MT SUSP: 5.0000 mL | Freq: Four times a day (QID) | OROMUCOSAL | Status: DC

## 2017-01-19 MED ORDER — VITAMIN B-6 50 MG PO TABS
100.0000 mg | ORAL_TABLET | Freq: Every day | ORAL | Status: DC
  Administered 2017-01-20 – 2017-01-22 (×3): 100 mg via ORAL
  Filled 2017-01-19: qty 2
  Filled 2017-01-19: qty 1
  Filled 2017-01-19 (×2): qty 2

## 2017-01-19 MED ORDER — ACETAMINOPHEN 160 MG/5ML PO SOLN: 650.0000 mg | ORAL | Status: DC | PRN

## 2017-01-19 MED ORDER — ACETAMINOPHEN 650 MG RE SUPP: 650.0000 mg | RECTAL | Status: DC | PRN

## 2017-01-19 MED ORDER — INSULIN ASPART 100 UNIT/ML ~~LOC~~ SOLN
0.0000 [IU] | Freq: Three times a day (TID) | SUBCUTANEOUS | Status: DC
  Administered 2017-01-20: 14:00:00 1 [IU] via SUBCUTANEOUS
  Administered 2017-01-21: 2 [IU] via SUBCUTANEOUS
  Filled 2017-01-19: qty 1
  Filled 2017-01-19: qty 2

## 2017-01-19 MED ORDER — ENOXAPARIN SODIUM 40 MG/0.4ML ~~LOC~~ SOLN: 40.0000 mg | SUBCUTANEOUS | Status: DC

## 2017-01-19 MED ORDER — ZINC SULFATE 220 (50 ZN) MG PO CAPS
220.0000 mg | ORAL_CAPSULE | Freq: Every day | ORAL | Status: DC
  Administered 2017-01-19 – 2017-01-22 (×4): 220 mg via ORAL
  Filled 2017-01-19 (×5): qty 1

## 2017-01-19 MED ORDER — VITAMIN B-12 1000 MCG PO TABS
1000.0000 ug | ORAL_TABLET | Freq: Every day | ORAL | Status: DC
  Administered 2017-01-19 – 2017-01-22 (×4): 1000 ug via ORAL
  Filled 2017-01-19 (×4): qty 1

## 2017-01-19 MED ORDER — ACETAMINOPHEN 325 MG PO TABS: 650.0000 mg | ORAL_TABLET | ORAL | Status: DC | PRN

## 2017-01-19 MED ORDER — FERROUS SULFATE 325 (65 FE) MG PO TABS
325.0000 mg | ORAL_TABLET | Freq: Every day | ORAL | Status: DC
  Administered 2017-01-19 – 2017-01-22 (×4): 325 mg via ORAL
  Filled 2017-01-19 (×4): qty 1

## 2017-01-19 MED ORDER — OXYCODONE HCL 5 MG PO TABS: 5.0000 mg | ORAL_TABLET | Freq: Four times a day (QID) | ORAL | Status: DC | PRN

## 2017-01-19 MED ORDER — INSULIN ASPART 100 UNIT/ML ~~LOC~~ SOLN: 0.0000 [IU] | Freq: Every day | SUBCUTANEOUS | Status: DC

## 2017-01-19 MED ORDER — HYDRALAZINE HCL 20 MG/ML IJ SOLN: 10.0000 mg | Freq: Four times a day (QID) | INTRAMUSCULAR | Status: DC | PRN

## 2017-01-19 MED ORDER — STROKE: EARLY STAGES OF RECOVERY BOOK
Freq: Once | Status: AC
  Administered 2017-01-19: 17:00:00

## 2017-01-19 MED ORDER — DIGOXIN 125 MCG PO TABS
0.0625 mg | ORAL_TABLET | Freq: Every day | ORAL | Status: DC
  Administered 2017-01-20 – 2017-01-22 (×3): 0.0625 mg via ORAL
  Filled 2017-01-19 (×3): qty 1

## 2017-01-19 MED ORDER — APIXABAN 5 MG PO TABS
5.0000 mg | ORAL_TABLET | Freq: Two times a day (BID) | ORAL | Status: DC
  Administered 2017-01-19 – 2017-01-22 (×6): 5 mg via ORAL
  Filled 2017-01-19 (×6): qty 1

## 2017-01-19 MED ORDER — SENNOSIDES-DOCUSATE SODIUM 8.6-50 MG PO TABS: 1.0000 | ORAL_TABLET | Freq: Every evening | ORAL | Status: DC | PRN

## 2017-01-19 NOTE — ED Triage Notes (Addendum)
Pt to ed with c/o left sided facial drooping and slurred speech that started at 10 am today.  Pt from home via ems. DR williams at bedside. Pt alert and oriented.  Grips equal bilat.  Pt placed on CM.  IV started labs drawn and sent.

## 2017-01-19 NOTE — ED Notes (Signed)
Pt to CT scan.

## 2017-01-19 NOTE — ED Notes (Signed)
Pt placed on bedpan, small amount clear yellow urine out.

## 2017-01-19 NOTE — ED Provider Notes (Signed)
[facility] Emergency Department Provider Note       Time seen: ----------------------------------------- 2:22 PM on 01/19/2017 -----------------------------------------     I have reviewed the triage vital signs and the nursing notes.   HISTORY   Chief Complaint Weakness    HPI [name] is a 83 y.o. female who presents to the ED for left-sided facial drooping and slurred speech that started at 10 AM today. Patient presents from home via EMS. She arrives alert and oriented with no neurologic deficits but symptoms again started around 3 hours ago or so. She has not had this before. Currently she takes Eliquis for anticoagulation. She denies recent illness or other complaints.   Past Medical History:  Diagnosis Date  . Atrial fibrillation (HCC)   . CHF (congestive heart failure) (HCC)   . Diabetes mellitus without complication (HCC)   . Dysrhythmia   . Foot ulcer, right (HCC)   . Gangrene of toe (HCC)   . Gout   . Hypertension   . Peripheral vascular disease [facility])     Patient Active Problem List   Diagnosis Date Noted  . Cellulitis 02/03/2016  . PVD (peripheral vascular disease) (HCC) 02/03/2016  . HTN (hypertension) 02/03/2016  . A-fib (HCC) 02/03/2016  . CHF (congestive heart failure) (HCC) 02/03/2016  . DM foot (HCC) 05/27/2015  . Ulcer of foot due to diabetes mellitus (HCC) 05/27/2015  . Diabetic foot ulcer (HCC) 05/27/2015    Past Surgical History:  Procedure Laterality Date  . EYE SURGERY    . JOINT REPLACEMENT    . LEG AMPUTATION THROUGH KNEE Left   . PERIPHERAL VASCULAR CATHETERIZATION Right 04/08/2015   Procedure: Lower Extremity Angiography;  Surgeon: Renford Dills, MD;  Location: [facility] INVASIVE CV LAB;  Service: Cardiovascular;  Laterality: Right;  . PERIPHERAL VASCULAR CATHETERIZATION  04/08/2015   Procedure: Lower Extremity Intervention;  Surgeon: Renford Dills, MD;  Location: [facility] INVASIVE CV LAB;  Service:  Cardiovascular;;  . PERIPHERAL VASCULAR CATHETERIZATION N/A 07/15/2015   Procedure: Abdominal Aortogram w/Lower Extremity;  Surgeon: Renford Dills, MD;  Location: [facility] INVASIVE CV LAB;  Service: Cardiovascular;  Laterality: N/A;  . PERIPHERAL VASCULAR CATHETERIZATION  07/15/2015   Procedure: Lower Extremity Intervention;  Surgeon: Renford Dills, MD;  Location: [facility] INVASIVE CV LAB;  Service: Cardiovascular;;  . PERIPHERAL VASCULAR CATHETERIZATION Right 02/09/2016   Procedure: Lower Extremity Angiography;  Surgeon: Annice Needy, MD;  Location: [facility] INVASIVE CV LAB;  Service: Cardiovascular;  Laterality: Right;  . PERIPHERAL VASCULAR CATHETERIZATION  02/09/2016   Procedure: Lower Extremity Intervention;  Surgeon: Annice Needy, MD;  Location: [facility] INVASIVE CV LAB;  Service: Cardiovascular;;  . Right Knee replacement      Allergies Amoxicillin; Cephalexin; Prednisolone; Prednisone; Toradol [ketorolac tromethamine]; and Tramadol  Social History Social History  Substance Use Topics  . Smoking status: Former Smoker    Quit date: 12/02/1954  . Smokeless tobacco: Never Used  . Alcohol use No    Review of Systems Constitutional: Negative for fever. Eyes: Negative for vision changes ENT:  Negative for congestion, sore throat Cardiovascular: Negative for chest pain. Respiratory: Negative for shortness of breath. Gastrointestinal: Negative for abdominal pain, vomiting and diarrhea. Genitourinary: Negative for dysuria. Musculoskeletal: Negative for back pain. Skin: Negative for rash. Neurological: Positive for speech disturbance, facial droop  All systems negative/normal/unremarkable except as stated in the HPI  ____________________________________________   PHYSICAL EXAM:  VITAL SIGNS: ED Triage Vitals  Enc Vitals Group     BP  01/19/17 1337 (!) 143/72     Pulse Rate 01/19/17 1337 (!) 59     Resp 01/19/17 1337 20     Temp 01/19/17 1337 98 F (36.7 C)     Temp Source 01/19/17  1337 Oral     SpO2 01/19/17 1337 93 %     Weight 01/19/17 1338 172 lb (78 kg)     Height --      Head Circumference --      Peak Flow --      Pain Score --      Pain Loc --      Pain Edu? --      Excl. in GC? --     Constitutional: Alert and oriented. Lethargic, no distress Eyes: Conjunctivae are normal. PERRL. Normal extraocular movements. ENT   Head: Normocephalic and atraumatic.   Nose: No congestion/rhinnorhea.   Mouth/Throat: Mucous membranes are moist.   Neck: No stridor. Cardiovascular: Slow rate, irregular rhythm. No murmurs, rubs, or gallops. Respiratory: Normal respiratory effort without tachypnea nor retractions. Breath sounds are clear and equal bilaterally. No wheezes/rales/rhonchi. Gastrointestinal: Soft and nontender. Normal bowel sounds Musculoskeletal: Nontender with normal range of motion in extremities. No lower extremity tenderness nor edema. Neurologic:  Expressive aphasia is noted, left facial drooping appears intermittent. Strength, sensation, cerebellar function appears be normal at this time. Skin:  Skin is warm, dry and intact. No rash noted. Psychiatric: Mood and affect are normal. Speech and behavior are normal.  ____________________________________________  EKG: Interpreted by me. Atrial fibrillation with a rate of 61 bpm, normal PR interval, normal QRS, normal QT.  ____________________________________________  ED COURSE:  Pertinent labs & imaging results that were available during my care of the patient were reviewed by me and considered in my medical decision making (see chart for details). Patient presents for strokelike symptoms, we will assess with labs and imaging as indicated.   Procedures ____________________________________________   LABS (pertinent positives/negatives)  Labs Reviewed  ETHANOL  PROTIME-INR  APTT  CBC  DIFFERENTIAL  COMPREHENSIVE METABOLIC PANEL  RAPID URINE DRUG SCREEN, HOSP PERFORMED  URINALYSIS,  ROUTINE W REFLEX MICROSCOPIC  TROPONIN I    RADIOLOGY  CT head is unremarkable  ____________________________________________  FINAL ASSESSMENT AND PLAN  TIA  Plan: Patient's labs and imaging were dictated above. Patient had presented for strokelike symptoms which have resolved at the moment. Patient has been evaluated by telling neurology who agrees with the assessment. She is currently taking Eliquis and will need a full stroke workup in the hospital. She is stable for admission at this time.   Emily Filbert, MD   Note: This note was generated in part or whole with voice recognition software. Voice recognition is usually quite accurate but there are transcription errors that can and very often do occur. I apologize for any typographical errors that were not detected and corrected.     Emily Filbert, MD 01/19/17 [phone]

## 2017-01-19 NOTE — Progress Notes (Signed)
Chaplain responded to a code stroke. Patient's son worked in [place] as a Veterinary surgeon. Chaplain provided care to patient's son and daughter while the medical team was providing treatments to the patient. The family was not interested in prayer.   01/19/17 1500  Clinical Encounter Type  Visited With Patient and family together  Visit Type Initial  Referral From Nurse  Consult/Referral To Chaplain  Spiritual Encounters  Spiritual Needs Other (Comment)

## 2017-01-19 NOTE — H&P (Signed)
[facility] [place] at [facility]   PATIENT NAME: [name]    MR#:  [id number]  DATE OF BIRTH:  Oct 24, 1920  DATE OF ADMISSION:  01/19/2017  PRIMARY CARE PHYSICIAN: Dorothey Baseman, MD   REQUESTING/REFERRING PHYSICIAN: dr Mayford Knife  CHIEF COMPLAINT:   Slurred speech HISTORY OF PRESENT ILLNESS:  [name]  is a 83 y.o. female with a known history of Atrial fibrillation on anticoagulation and diabetes who presents with above complaint. Daughter reports that this morning at approximately 10 AM patient started having left-sided facial droop with slurred speech. She was also feeling dizzy and was leaning towards the left side. EMS was called and patient was brought to the ER for further evaluation. Patient continues to have some slowing of her speech was slurred speech and decreased sensation on left side of her face with decreased strength in left upper extremity. CT the head was negative for acute CVA  PAST MEDICAL HISTORY:   Past Medical History:  Diagnosis Date  . Atrial fibrillation (HCC)   . CHF (congestive heart failure) (HCC)   . Diabetes mellitus without complication (HCC)   . Dysrhythmia   . Foot ulcer, right (HCC)   . Gangrene of toe (HCC)   . Gout   . Hypertension   . Peripheral vascular disease (HCC)     PAST SURGICAL HISTORY:   Past Surgical History:  Procedure Laterality Date  . EYE SURGERY    . JOINT REPLACEMENT    . LEG AMPUTATION THROUGH KNEE Left   . PERIPHERAL VASCULAR CATHETERIZATION Right 04/08/2015   Procedure: Lower Extremity Angiography;  Surgeon: Renford Dills, MD;  Location: [facility] INVASIVE CV [facility];  Service: Cardiovascular;  Laterality: Right;  . PERIPHERAL VASCULAR CATHETERIZATION  04/08/2015   Procedure: Lower Extremity Intervention;  Surgeon: Renford Dills, MD;  Location: [facility];  Service: Cardiovascular;;  . PERIPHERAL VASCULAR CATHETERIZATION N/A 07/15/2015   Procedure: Abdominal Aortogram w/Lower  Extremity;  Surgeon: Renford Dills, MD;  Location: [facility] CV [facility];  Service: Cardiovascular;  Laterality: N/A;  . PERIPHERAL VASCULAR CATHETERIZATION  07/15/2015   Procedure: Lower Extremity Intervention;  Surgeon: Renford Dills, MD;  Location: [facility];  Service: Cardiovascular;;  . PERIPHERAL VASCULAR CATHETERIZATION Right 02/09/2016   Procedure: Lower Extremity Angiography;  Surgeon: Annice Needy, MD;  Location: [facility];  Service: Cardiovascular;  Laterality: Right;  . PERIPHERAL VASCULAR CATHETERIZATION  02/09/2016   Procedure: Lower Extremity Intervention;  Surgeon: Annice Needy, MD;  Location: [facility];  Service: Cardiovascular;;  . Right Knee replacement      SOCIAL HISTORY:   Social History  Substance Use Topics  . Smoking status: Former Smoker    Quit date: 12/02/1954  . Smokeless tobacco: Never Used  . Alcohol use No    FAMILY HISTORY:  History reviewed. No pertinent family history.  DRUG ALLERGIES:   Allergies  Allergen Reactions  . Amoxicillin   . Cephalexin     Other reaction(s): Unknown  . Prednisolone   . Prednisone   . Toradol [Ketorolac Tromethamine]   . Tramadol     REVIEW OF SYSTEMS:   Review of Systems  Constitutional: Negative.  Negative for chills, fever and malaise/fatigue.  HENT: Negative.  Negative for ear discharge, ear pain, hearing loss, nosebleeds and sore throat.   Eyes: Negative.  Negative for blurred vision and pain.  Respiratory: Negative.  Negative for cough, hemoptysis, shortness of breath and wheezing.  Cardiovascular: Negative.  Negative for chest pain, palpitations and leg swelling.  Gastrointestinal: Negative.  Negative for abdominal pain, blood in stool, diarrhea, nausea and vomiting.  Genitourinary: Negative.  Negative for dysuria.  Musculoskeletal: Negative.  Negative for back pain.  Skin: Negative.   Neurological: Positive for sensory change, speech change and focal weakness.  Negative for dizziness, tremors, seizures and headaches.  Endo/Heme/Allergies: Negative.  Does not bruise/bleed easily.  Psychiatric/Behavioral: Negative.  Negative for depression, hallucinations and suicidal ideas.    MEDICATIONS AT HOME:   Prior to Admission medications   Medication Sig Start Date End Date Taking? Authorizing Provider  apixaban (ELIQUIS) 5 MG TABS tablet Take 5 mg by mouth 2 (two) times daily.    Yes Historical Provider, MD  Cholecalciferol (VITAMIN D3) 5000 units CAPS Take 1 capsule by mouth daily.   Yes Historical Provider, MD  clotrimazole-betamethasone (LOTRISONE) cream Apply 1 application topically daily.   Yes Historical Provider, MD  Copper Gluconate 2 MG TABS Take 2 mg by mouth 1 day or 1 dose.   Yes Historical Provider, MD  digoxin (LANOXIN) 0.125 MG tablet Take 0.0625 mg by mouth daily.    Yes Historical Provider, MD  docusate sodium (COLACE) 100 MG capsule Take 100 mg by mouth 2 (two) times daily as needed.    Yes Historical Provider, MD  ferrous sulfate 325 (65 FE) MG tablet Take 325 mg by mouth daily.    Yes Historical Provider, MD  furosemide (LASIX) 40 MG tablet Take 40 mg by mouth 2 (two) times daily.    Yes Historical Provider, MD  metoprolol (LOPRESSOR) 50 MG tablet Take 1 tablet (50 mg total) by mouth 2 (two) times daily. Patient taking differently: Take 100 mg by mouth 2 (two) times daily.  02/13/16  Yes Katha Hamming, MD  potassium chloride (K-DUR) 10 MEQ tablet Take 10 mEq by mouth daily.   Yes Historical Provider, MD  pyridOXINE (VITAMIN B-6) 100 MG tablet Take 100 mg by mouth daily.   Yes Historical Provider, MD  selenium 50 MCG TABS tablet Take 200 mcg by mouth daily.   Yes Historical Provider, MD  vitamin B-12 (CYANOCOBALAMIN) 1000 MCG tablet Take 1,000 mcg by mouth daily.   Yes Historical Provider, MD  zinc gluconate 50 MG tablet Take 50 mg by mouth daily.   Yes Historical Provider, MD  feeding supplement, ENSURE ENLIVE, (ENSURE ENLIVE) LIQD  Take 237 mLs by mouth 2 (two) times daily between meals. Patient not taking: Reported on 01/19/2017 02/13/16   Katha Hamming, MD  nystatin (MYCOSTATIN) 100000 UNIT/ML suspension Use as directed 5 mLs (500,000 Units total) in the mouth or throat 4 (four) times daily. Patient not taking: Reported on 01/19/2017 02/13/16   Katha Hamming, MD  nystatin (NYSTATIN) powder Apply topically 3 (three) times daily. Patient not taking: Reported on 01/19/2017 02/13/16   Katha Hamming, MD  oxyCODONE (OXY IR/ROXICODONE) 5 MG immediate release tablet Take 1 tablet (5 mg total) by mouth every 6 (six) hours as needed for moderate pain or severe pain. Patient not taking: Reported on 01/19/2017 02/13/16   Altamese Dilling, MD      VITAL SIGNS:  Blood pressure (!) 184/71, pulse 68, temperature 98 F (36.7 C), resp. rate 15, weight 78 kg (172 lb), SpO2 96 %.  PHYSICAL EXAMINATION:   Physical Exam  Constitutional: She is oriented to person, place, and time and well-developed, well-nourished, and in no distress. No distress.  HENT:  Head: Normocephalic.  Eyes: No scleral icterus.  Neck:  Normal range of motion. Neck supple. No JVD present. No tracheal deviation present.  Cardiovascular: Normal rate and normal heart sounds.  Exam reveals no gallop and no friction rub.   No murmur heard. Irregular, irregular  Pulmonary/Chest: Effort normal and breath sounds normal. No respiratory distress. She has no wheezes. She has no rales. She exhibits no tenderness.  Abdominal: Soft. Bowel sounds are normal. She exhibits no distension and no mass. There is no tenderness. There is no rebound and no guarding.  Musculoskeletal: Normal range of motion. She exhibits no edema.  Left AKA  Neurological: She is alert and oriented to person, place, and time.  Decreased sensation left cheek Left upper extremity 4+ out of 5  Skin: Skin is warm. No rash noted. No erythema.  Psychiatric: Affect and judgment normal.       LABORATORY PANEL:   CBC  Recent Labs Lab 01/19/17 1336  WBC 8.0  HGB 13.1  HCT 38.2  PLT 235   ------------------------------------------------------------------------------------------------------------------  Chemistries   Recent Labs Lab 01/19/17 1336  NA 140  K 4.1  CL 103  CO2 30  GLUCOSE 120*  BUN 21*  CREATININE 0.72  CALCIUM 9.2  AST 29  ALT 16  ALKPHOS 63  BILITOT 0.8   ------------------------------------------------------------------------------------------------------------------  Cardiac Enzymes  Recent Labs Lab 01/19/17 1336  TROPONINI <0.03   ------------------------------------------------------------------------------------------------------------------  RADIOLOGY:  Ct Head Code Stroke W/o Cm  Result Date: 01/19/2017 CLINICAL DATA:  Code stroke. LEFT-sided facial drooping and slurred speech which began earlier today. aphasia. EXAM: CT HEAD WITHOUT CONTRAST TECHNIQUE: Contiguous axial images were obtained from the base of the skull through the vertex without intravenous contrast. COMPARISON:  11/21/2014. FINDINGS: Brain: No evidence for acute infarction, hemorrhage, mass lesion, hydrocephalus, or extra-axial fluid. Moderate cerebral and cerebellar atrophy, not unexpected for age. Hypoattenuation of white matter consistent with chronic microvascular ischemic change. Partial empty sella. Vascular: Vascular calcification affecting the skull base internal carotid arteries as well as the distal basilar artery. No findings conclusive for intracranial large vessel occlusion. Skull: Negative for fracture or focal skull abnormality. Degenerative changes prominent at radiologic the C1-2 articulation. Sinuses/Orbits: No acute finding. Other: None. ASPECTS [facility] Stroke Program Early CT Score) - Ganglionic level infarction (caudate, lentiform nuclei, internal capsule, insula, M1-M3 cortex): 7 - Supraganglionic infarction (M4-M6 cortex): 3 Total score  (0-10 with 10 being normal): 10 IMPRESSION: 1. Atrophy and small vessel disease. No acute intracranial findings. 2. ASPECTS is 10. These results were called by telephone at the time of interpretation on 01/19/2017 at 1:59 pm to Dr. Daryel November , who verbally acknowledged these results. Electronically Signed   By: Elsie Stain M.D.   On: 01/19/2017 14:01    EKG:   Orders placed or performed during the hospital encounter of 01/19/17  . ED EKG  . ED EKG    IMPRESSION AND PLAN:   83 year old female with atrial fibrillation on anticoagulation who presents with slurred speech, dizziness and left sided weakness.  1. Acute CVA with ongoing left-sided weakness and decreased sensation on left face Continue CVA workup with MRI/MRA brain, carotid Doppler and echocardiogram Continue anticoagulation with Eliquis Patient needs physical therapy, occupational therapy, speech and neurology consultation Continue neuro checks  2. Essential hypertension: I will hold metoprolol for now to allow brain perfusion When necessary hydralazine ordered  3. Atrial fibrillation: Continue digoxin and Eliquis  4. Iron deficiency anemia on ferrous sulfate   All the records are reviewed and case discussed with ED provider. Management plans  discussed with the patient's daughter and she is in agreement  CODE STATUS: DNR  TOTAL TIME TAKING CARE OF THIS PATIENT: 40 minutes.    Vester Balthazor M.D on 01/19/2017 at 3:10 PM  Between 7am to 6pm - Pager - [phone]  After 6pm go to [email or web address] - Social research officer, government  [facility]  Office  [phone]  CC: Primary care physician; Dorothey Baseman, MD

## 2017-01-19 NOTE — ED Notes (Signed)
Neuro MD assessment via computer.

## 2017-01-19 NOTE — Progress Notes (Signed)
At 1910, oncoming and offgoing nurses went to assess Mrs. [name], she had a blank stare for about 1 minute before she would respond to our commands. Her speech was slurring worse that the 1800 neuro check, her grips were weaker in both hands, she could not hold up her arms, which was a change from the previous check as well. She is on the way to have her echo done now, prime doc paged, waiting for call back

## 2017-01-20 ENCOUNTER — Inpatient Hospital Stay: Payer: Medicare Other

## 2017-01-20 DIAGNOSIS — I63319 Cerebral infarction due to thrombosis of unspecified middle cerebral artery: Secondary | ICD-10-CM

## 2017-01-20 LAB — GLUCOSE, CAPILLARY
GLUCOSE-CAPILLARY: 88 mg/dL (ref 65–99)
GLUCOSE-CAPILLARY: 146 mg/dL — AB (ref 65–99)
Glucose-Capillary: 137 mg/dL — ABNORMAL HIGH (ref 65–99)
Glucose-Capillary: 100 mg/dL — ABNORMAL HIGH (ref 65–99)

## 2017-01-20 LAB — ECHOCARDIOGRAM COMPLETE
HEIGHTINCHES: 69 in
Weight: 2606.4 oz

## 2017-01-20 LAB — HEMOGLOBIN A1C
HEMOGLOBIN A1C: 6.1 % — AB (ref 4.8–5.6)
Mean Plasma Glucose: 128 mg/dL

## 2017-01-20 LAB — LIPID PANEL
Cholesterol: 197 mg/dL (ref 0–200)
HDL: 40 mg/dL — AB (ref >40)
LDL CALC: 144 mg/dL — AB (ref 0–99)
Total CHOL/HDL Ratio: 4.9 RATIO
Triglycerides: 66 mg/dL (ref <150)
VLDL: 13 mg/dL (ref 0–40)

## 2017-01-20 MED ORDER — ATORVASTATIN CALCIUM 20 MG PO TABS
10.0000 mg | ORAL_TABLET | Freq: Every day | ORAL | Status: DC
  Administered 2017-01-20 – 2017-01-21 (×2): 10 mg via ORAL
  Filled 2017-01-20: qty 1
  Filled 2017-01-20: qty 2

## 2017-01-20 MED ORDER — HYDRALAZINE HCL 20 MG/ML IJ SOLN: 10.0000 mg | Freq: Four times a day (QID) | INTRAMUSCULAR | Status: DC | PRN

## 2017-01-20 NOTE — Consult Note (Addendum)
Reason for Consult:slurry speech Referring Physician: Dr. Elpidio Anis  CC: Slurry speech   HPI: [name] is an 83 y.o. female with a known history of Atrial fibrillation on anticoagulation with eliquis and diabetes who presents with slurry speech and L facial droop. Daughter reports that last morning at approximately 10 AM patient started having left-sided facial droop with slurred speech. She was also feeling dizzy and was leaning towards the left side. EMS was called and patient was brought to the ER for further evaluation. Patient continues to have some slowing of her speech was slurred speech and decreased sensation on left side of her face with decreased strength in left upper extremity. CT the head was negative for acute infarct   Past Medical History:  Diagnosis Date  . Atrial fibrillation (HCC)   . CHF (congestive heart failure) (HCC)   . Diabetes mellitus without complication (HCC)   . Dysrhythmia   . Foot ulcer, right (HCC)   . Gangrene of toe (HCC)   . Gout   . Hypertension   . Peripheral vascular disease [facility])     Past Surgical History:  Procedure Laterality Date  . EYE SURGERY    . JOINT REPLACEMENT    . LEG AMPUTATION THROUGH KNEE Left   . PERIPHERAL VASCULAR CATHETERIZATION Right 04/08/2015   Procedure: Lower Extremity Angiography;  Surgeon: Renford Dills, MD;  Location: [facility] INVASIVE CV [facility];  Service: Cardiovascular;  Laterality: Right;  . PERIPHERAL VASCULAR CATHETERIZATION  04/08/2015   Procedure: Lower Extremity Intervention;  Surgeon: Renford Dills, MD;  Location: [facility] INVAS[facility] CV [facility];  Service: Cardiovascular;;  . PERIPHERAL VASCULAR CATHETERIZATION N/A 07/15/2015   Procedure: Abdominal Aortogram w/Lower Extremity;  Surgeon: Renford Dills, MD;  Location: [facility] CV [facility];  Service: Cardiovascular;  Laterality: N/A;  . PERIPHERAL VASCULAR CATHETERIZATION  07/15/2015   Procedure: Lower Extremity Intervention;  Surgeon: Renford Dills, MD;   Location: [facility] CV [facility];  Service: Cardiovascular;;  . PERIPHERAL VASCULAR CATHETERIZATION Right 02/09/2016   Procedure: Lower Extremity Angiography;  Surgeon: Annice Needy, MD;  Location: [facility];  Service: Cardiovascular;  Laterality: Right;  . PERIPHERAL VASCULAR CATHETERIZATION  02/09/2016   Procedure: Lower Extremity Intervention;  Surgeon: Annice Needy, MD;  Location: [facility] CV [facility];  Service: Cardiovascular;;  . Right Knee replacement      History reviewed. No pertinent family history.  Social History:  reports that she quit smoking about 62 years ago. She has never used smokeless tobacco. She reports that she does not drink alcohol or use drugs.  Allergies  Allergen Reactions  . Amoxicillin   . Cephalexin     Other reaction(s): Unknown  . Prednisolone   . Prednisone   . Toradol [Ketorolac Tromethamine]   . Tramadol     Medications: I have reviewed the patient's current medications.  ROS: History obtained from daughter, chart review and the patient  General ROS: negative for - chills, fatigue, fever, night sweats, weight gain or weight loss Psychological ROS: negative for - behavioral disorder, hallucinations, memory difficulties, mood swings or suicidal ideation Ophthalmic ROS: negative for - blurry vision, double vision, eye pain or loss of vision ENT ROS: negative for - epistaxis, nasal discharge, oral lesions, sore throat, tinnitus or vertigo Allergy and Immunology ROS: negative for - hives or itchy/watery eyes Hematological and Lymphatic ROS: negative for - bleeding problems, bruising or swollen lymph nodes Endocrine ROS: negative for - galactorrhea, hair pattern changes, polydipsia/polyuria or temperature intolerance Respiratory  ROS: negative for - cough, hemoptysis, shortness of breath or wheezing Cardiovascular ROS: negative for - chest pain, dyspnea on exertion, edema or irregular heartbeat Gastrointestinal ROS: negative for - abdominal pain,  diarrhea, hematemesis, nausea/vomiting or stool incontinence Genito-Urinary ROS: negative for - dysuria, hematuria, incontinence or urinary frequency/urgency Musculoskeletal ROS: negative for - joint swelling or muscular weakness Neurological ROS: as noted in HPI Dermatological ROS: negative for rash and skin lesion changes  Physical Examination: Blood pressure (!) 145/72, pulse 91, temperature 98.3 F (36.8 C), temperature source Oral, resp. rate 16, height  (1.753 m), weight 73.9 kg (162 lb 14.4 oz), SpO2 93 %.    Neurological Examination   Mental Status: Alert to name and place. Very clear dysarthria, very slow speech  Cranial Nerves: II: Discs flat bilaterally; Visual fields grossly normal, pupils equal, round, reactive to light and accommodation III,IV, VI: ptosis not present, extra-ocular motions intact bilaterally V,VII: L facial droop present  VIII: hearing normal bilaterally IX,X: gag reflex present XI: bilateral shoulder shrug XII: midline tongue extension Motor: Right : Upper extremity   5/5    Left:     Upper extremity   4/5  Lower extremity   5/5     Lower extremity   4/5 Tone and bulk:normal tone throughout; no atrophy noted Sensory: decreased to light touch L side of body Deep Tendon Reflexes: 1+ and symmetric throughout Plantars: Right: downgoing   Left: downgoing Cerebellar: Not tested Gait: not tested      Laboratory Studies:   Basic Metabolic Panel:  Recent Labs Lab 01/19/17 1336 01/19/17 1646  NA 140  --   K 4.1  --   CL 103  --   CO2 30  --   GLUCOSE 120*  --   BUN 21*  --   CREATININE 0.72 0.66  CALCIUM 9.2  --     Liver Function Tests:  Recent Labs Lab 01/19/17 1336  AST 29  ALT 16  ALKPHOS 63  BILITOT 0.8  PROT 6.4*  ALBUMIN 3.5   No results for input(s): LIPASE, AMYLASE in the last 168 hours. No results for input(s): AMMONIA in the last 168 hours.  CBC:  Recent Labs Lab 01/19/17 1336 01/19/17 1646  WBC 8.0 7.4   NEUTROABS 5.0  --   HGB 13.1 13.5  HCT 38.2 39.6  MCV 97.1 97.0  PLT 235 241    Cardiac Enzymes:  Recent Labs Lab 01/19/17 1336  TROPONINI <0.03    BNP: Invalid input(s): POCBNP  CBG:  Recent Labs Lab 01/19/17 1629 01/19/17 2013 01/20/17 0746  GLUCAP 97 124* 88    Microbiology: Results for orders placed or performed during the hospital encounter of 02/03/16  Urine culture     Status: None   Collection Time: 02/03/16  9:14 PM  Result Value Ref Range Status   Specimen Description URINE, RANDOM  Final   Special Requests Normal  Final   Culture NO GROWTH 2 DAYS  Final   Report Status 02/05/2016 FINAL  Final  Culture, blood (routine x 2)     Status: None   Collection Time: 02/03/16  9:15 PM  Result Value Ref Range Status   Specimen Description BLOOD LEFT HAND  Final   Special Requests BOTTLES DRAWN AEROBIC AND ANAEROBIC  Final   Culture NO GROWTH 5 DAYS  Final   Report Status 02/08/2016 FINAL  Final  Wound culture     Status: None   Collection Time: 02/03/16  9:26 PM  Result Value Ref Range Status   Specimen Description WOUND  Final   Special Requests Normal  Final   Gram Stain   Final    NO WBC SEEN RARE GRAM POSITIVE COCCI FEW GRAM NEGATIVE RODS    Culture   Final    MODERATE GROWTH PROTEUS MIRABILIS MODERATE GROWTH STAPHYLOCOCCUS AUREUS MODERATE GROWTH ESCHERICHIA COLI    Report Status 02/07/2016 FINAL  Final   Organism ID, Bacteria ESCHERICHIA COLI  Final   Organism ID, Bacteria PROTEUS MIRABILIS  Final   Organism ID, Bacteria STAPHYLOCOCCUS AUREUS  Final      Susceptibility   Staphylococcus aureus - MIC*    CIPROFLOXACIN Value in next row Resistant      RESISTANT>=8    ERYTHROMYCIN Value in next row Resistant      RESISTANT>=8    GENTAMICIN Value in next row Sensitive      SENSITIVE<0.5    OXACILLIN Value in next row Sensitive      SENSITIVE0.5    TETRACYCLINE Value in next row Sensitive      SENSITIVE2    VANCOMYCIN Value in next row  Sensitive      SENSITIVE1    TRIMETH/SULFA Value in next row Sensitive      SENSITIVE<=10    CLINDAMYCIN Value in next row Resistant      RESISTANT>=8    RIFAMPIN Value in next row Sensitive      SENSITIVE<=0.5    Inducible Clindamycin Value in next row Sensitive      SENSITIVE<=0.5    * MODERATE GROWTH STAPHYLOCOCCUS AUREUS   Escherichia coli - MIC*    AMPICILLIN Value in next row Resistant      SENSITIVE<=0.5    CEFAZOLIN Value in next row Sensitive      SENSITIVE<=0.5    CEFEPIME Value in next row Sensitive      SENSITIVE<=0.5    CEFTAZIDIME Value in next row Sensitive      SENSITIVE<=0.5    CEFTRIAXONE Value in next row Sensitive      SENSITIVE<=0.5    CIPROFLOXACIN Value in next row Resistant      SENSITIVE<=0.5    GENTAMICIN Value in next row Sensitive      SENSITIVE<=0.5    IMIPENEM Value in next row Sensitive      SENSITIVE<=0.5    TRIMETH/SULFA Value in next row Resistant      SENSITIVE<=0.5    AMPICILLIN/SULBACTAM Value in next row Intermediate      SENSITIVE<=0.5    PIP/TAZO Value in next row Sensitive      SENSITIVE<=0.5    Extended ESBL Value in next row Sensitive      SENSITIVE<=0.5    * MODERATE GROWTH ESCHERICHIA COLI   Proteus mirabilis - MIC*    AMPICILLIN Value in next row Resistant      RESISTANT>=32    CEFAZOLIN Value in next row Sensitive      SENSITIVE<=4    CEFEPIME Value in next row Sensitive      SENSITIVE<=1    CEFTAZIDIME Value in next row Sensitive      SENSITIVE<=1    CEFTRIAXONE Value in next row Sensitive      SENSITIVE<=1    CIPROFLOXACIN Value in next row Sensitive      SENSITIVE1    GENTAMICIN Value in next row Resistant      RESISTANT>=16    IMIPENEM Value in next row Sensitive      SENSITIVE2    TRIMETH/SULFA Value in next row Resistant  RESISTANT>=320    AMPICILLIN/SULBACTAM Value in next row Sensitive      SENSITIVE8    PIP/TAZO Value in next row Sensitive      SENSITIVE<=4    * MODERATE GROWTH PROTEUS MIRABILIS   Anaerobic culture     Status: None   Collection Time: 02/03/16  9:26 PM  Result Value Ref Range Status   Specimen Description LEG  Final   Special Requests Normal  Final   Culture NO ANAEROBES ISOLATED  Final   Report Status 02/07/2016 FINAL  Final  Gastrointestinal Panel by PCR , Stool     Status: None   Collection Time: 02/12/16 12:09 PM  Result Value Ref Range Status   Campylobacter species NOT DETECTED NOT DETECTED Final   Plesimonas shigelloides NOT DETECTED NOT DETECTED Final   Salmonella species NOT DETECTED NOT DETECTED Final   Yersinia enterocolitica NOT DETECTED NOT DETECTED Final   Vibrio species NOT DETECTED NOT DETECTED Final   Vibrio cholerae NOT DETECTED NOT DETECTED Final   Enteroaggregative E coli (EAEC) NOT DETECTED NOT DETECTED Final   Enteropathogenic E coli (EPEC) NOT DETECTED NOT DETECTED Final   Enterotoxigenic E coli (ETEC) NOT DETECTED NOT DETECTED Final   Shiga like toxin producing E coli (STEC) NOT DETECTED NOT DETECTED Final   E. coli O157 NOT DETECTED NOT DETECTED Final   Shigella/Enteroinvasive E coli (EIEC) NOT DETECTED NOT DETECTED Final   Cryptosporidium NOT DETECTED NOT DETECTED Final   Cyclospora cayetanensis NOT DETECTED NOT DETECTED Final   Entamoeba histolytica NOT DETECTED NOT DETECTED Final   Giardia lamblia NOT DETECTED NOT DETECTED Final   Adenovirus F40/41 NOT DETECTED NOT DETECTED Final   Astrovirus NOT DETECTED NOT DETECTED Final   Norovirus GI/GII NOT DETECTED NOT DETECTED Final   Rotavirus A NOT DETECTED NOT DETECTED Final   Sapovirus (I, II, IV, and V) NOT DETECTED NOT DETECTED Final  C difficile quick scan w PCR reflex     Status: None   Collection Time: 02/12/16 12:09 PM  Result Value Ref Range Status   C Diff antigen NEGATIVE NEGATIVE Final   C Diff toxin NEGATIVE NEGATIVE Final   C Diff interpretation Negative for C. difficile  Final    Coagulation Studies:  Recent Labs  01/19/17 1336  LABPROT 14.3  INR 1.11     Urinalysis:  Recent Labs Lab 01/19/17 1431  COLORURINE STRAW*  LABSPEC 1.006  PHURINE 7.0  GLUCOSEU NEGATIVE  HGBUR NEGATIVE  BILIRUBINUR NEGATIVE  KETONESUR NEGATIVE  PROTEINUR NEGATIVE  NITRITE NEGATIVE  LEUKOCYTESUR NEGATIVE    Lipid Panel:     Component Value Date/Time   CHOL 197 01/20/2017 0416   TRIG 66 01/20/2017 0416   HDL 40 (L) 01/20/2017 0416   CHOLHDL 4.9 01/20/2017 0416   VLDL 13 01/20/2017 0416   LDLCALC 144 (H) 01/20/2017 0416    HgbA1C:  Lab Results  Component Value Date   HGBA1C 6.1 (H) 01/19/2017    Urine Drug Screen:     Component Value Date/Time   LABOPIA NONE DETECTED 01/19/2017 1431   COCAINSCRNUR NONE DETECTED 01/19/2017 1431   LABBENZ NONE DETECTED 01/19/2017 1431   AMPHETMU NONE DETECTED 01/19/2017 1431   THCU NONE DETECTED 01/19/2017 1431   LABBARB NONE DETECTED 01/19/2017 1431    Alcohol Level:  Recent Labs Lab 01/19/17 1646  ETH <5    Other results: A-fib rate controlled  Imaging: Ct Head Code Stroke W/o Cm  Result Date: 01/19/2017 CLINICAL DATA:  Code stroke. LEFT-sided facial drooping  and slurred speech which began earlier today. aphasia. EXAM: CT HEAD WITHOUT CONTRAST TECHNIQUE: Contiguous axial images were obtained from the base of the skull through the vertex without intravenous contrast. COMPARISON:  11/21/2014. FINDINGS: Brain: No evidence for acute infarction, hemorrhage, mass lesion, hydrocephalus, or extra-axial fluid. Moderate cerebral and cerebellar atrophy, not unexpected for age. Hypoattenuation of white matter consistent with chronic microvascular ischemic change. Partial empty sella. Vascular: Vascular calcification affecting the skull base internal carotid arteries as well as the distal basilar artery. No findings conclusive for intracranial large vessel occlusion. Skull: Negative for fracture or focal skull abnormality. Degenerative changes prominent at radiologic the C1-2 articulation. Sinuses/Orbits: No acute  finding. Other: None. ASPECTS [facility] Stroke Program Early CT Score) - Ganglionic level infarction (caudate, lentiform nuclei, internal capsule, insula, M1-M3 cortex): 7 - Supraganglionic infarction (M4-M6 cortex): 3 Total score (0-10 with 10 being normal): 10 IMPRESSION: 1. Atrophy and small vessel disease. No acute intracranial findings. 2. ASPECTS is 10. These results were called by telephone at the time of interpretation on 01/19/2017 at 1:59 pm to Dr. Daryel November , who verbally acknowledged these results. Electronically Signed   By: Elsie Stain M.D.   On: 01/19/2017 14:01     Assessment/Plan:  83 y.o. female with a known history of Atrial fibrillation on anticoagulation with eliquis and diabetes who presents with slurry speech and L facial droop. Daughter reports that last morning at approximately 10 AM patient started having left-sided facial droop with slurred speech. She was also feeling dizzy and was leaning towards the left side. EMS was called and patient was brought to the ER for further evaluation. Patient continues to have some slowing of her speech was slurred speech and decreased sensation on left side of her face with decreased strength in left upper extremity. CT the head was negative for acute infarct   - Likely subcortical ischemia on R side.  - Pt has dysarthria, R hemiplegia and R sensory deficits - Please continue her anticoagulation. Would not consider failure as anticoagulation reduces risk of stroke by 50 % - pt is going for MRI now - Pt/ot - will likely need rehab.   01/20/2017, 10:50 AM    Addendum: MRI PCA infarct.  Likely embolic Con't anticoagulation.  Rest of work up as above

## 2017-01-20 NOTE — Care Management (Signed)
Admitted to this facility with the diagnosis of acute CVA. Lives with daughter-in-law Lanora Manis [phone]). Dr. Terance Hart is listed as primary care physician.  Followed by [facility]. [facility] and [facility] in the past. Uses wheelchair to aid in transportation.  [facility] 2016.  Gwenette Greet RN MSN CCM Care Management [phone]

## 2017-01-20 NOTE — Clinical Social Work Placement (Signed)
   CLINICAL SOCIAL WORK PLACEMENT  NOTE  Date:  01/20/2017  Patient Details  Name: [name] MRN: [id number] Date of Birth: March 26, 1921  Clinical Social Work is seeking post-discharge placement for this patient at the Skilled  Nursing Facility level of care (*CSW will initial, date and re-position this form in  chart as items are completed):  Yes   Patient/family provided with [place] Clinical Social Work Department's list of facilities offering this level of care within the geographic area requested by the patient (or if unable, by the patient's family).  Yes   Patient/family informed of their freedom to choose among providers that offer the needed level of care, that participate in Medicare, Medicaid or managed care program needed by the patient, have an available bed and are willing to accept the patient.  Yes   Patient/family informed of 's ownership interest in [facility] and [facility], as well as of the fact that they are under no obligation to receive care at these facilities.  PASRR submitted to EDS on       PASRR number received on       Existing PASRR number confirmed on 01/20/17     FL2 transmitted to all facilities in geographic area requested by pt/family on 01/20/17     FL2 transmitted to all facilities within larger geographic area on       Patient informed that his/her managed care company has contracts with or will negotiate with certain facilities, including the following:            Patient/family informed of bed offers received.  Patient chooses bed at       Physician recommends and patient chooses bed at      Patient to be transferred to   on  .  Patient to be transferred to facility by       Patient family notified on   of transfer.  Name of family member notified:        PHYSICIAN       Additional Comment:    _______________________________________________ Dede Query, LCSW 01/20/2017, 3:16 PM

## 2017-01-20 NOTE — Evaluation (Signed)
Physical Therapy Evaluation Patient Details Name: [name] MRN: [id number] DOB: 03-Jul-1921 Today's Date: 01/20/2017   History of Present Illness  83 y.o.femalewith a known history of Atrial fibrillation on anticoagulation with eliquis, DM, CHF, dysrhythmia, HTN, PVD, and previous LLE AKA who presented to ED on 4/25 with slurry speech and L facial droop and feeling dizzy and leaning towards L side.   Clinical Impression  Pt with some confusion and impulsiveness with PT exam and though she was able to show some effort with getting to EOB and with attempt at standing she was very limited.  Apparently she is usually able to transfer from w/c w/o assist and has some degree of independence in her w/c at home - today she was functionally very limited and likely would need considerable assist with all functional tasks.  Unsure how her physical and mental status compares to baseline but regardless is seems that going to short term rehab is the safest and best option at this time.     Follow Up Recommendations SNF    Equipment Recommendations       Recommendations for Other Services       Precautions / Restrictions Precautions Precautions: Fall Restrictions Weight Bearing Restrictions: Yes LLE Weight Bearing:  (L AKA)      Mobility  Bed Mobility Overal bed mobility: Needs Assistance Bed Mobility: Supine to Sit     Supine to sit: Min assist     General bed mobility comments: Pt showed good effort in getting to EOB, needed only limited assist, able to use UEs to assist   Transfers Overall transfer level: Needs assistance Equipment used: Rolling walker (2 wheeled) Transfers: Sit to/from Stand Sit to Stand: Max assist         General transfer comment: Pt showed good effort in attempting getting to standing, but ultimately was very weak and unable to get to fully upright even with heavy assist.  Pt did not wish to attempt a second effort  Ambulation/Gait                 Stairs            Wheelchair Mobility    Modified Rankin (Stroke Patients Only)       Balance Overall balance assessment: Needs assistance Sitting-balance support: Bilateral upper extremity supported Sitting balance-Leahy Scale: Fair Sitting balance - Comments: Pt initially leaning L on getting to sitting, with min assist to adjust position at EOB she was able to maintain sitting balance with good relative confidence Postural control: Left lateral lean   Standing balance-Leahy Scale: Zero                               Pertinent Vitals/Pain Pain Assessment: No/denies pain    Home Living Family/patient expects to be discharged to:: Private residence Living Arrangements: Children Available Help at Discharge: Family;Available 24 hours/day Type of Home: House Home Access: Ramped entrance     Home Layout: One level Home Equipment: Wheelchair - manual;Bedside commode;Other (comment);Hospital bed (bed pan, slide board) Additional Comments: information gathered from chart, pt indicates that she can drive, walk all she needs, states she lives with her mother, etc    Prior Function Level of Independence: Needs assistance   Gait / Transfers Assistance Needed: pt generally indep from w/c level, able to perform wc<>elevated toilet transfers indep with PRN assist for slide board transfers when going wc<>electric shopping carts at the grocery store  ADL's / Homemaking Assistance Needed: pt needs some assist with LB dressing (rolling side to side in bed), bathing, can do light meal prep indep from wc, manages meds indep, shops for her own food once in the electric shopping cart, indep with bed pan use overnight and then empties it into the BSC.  Comments: apparently pt is able to transfer to/from w/c and do some basic ADLs     Hand Dominance        Extremity/Trunk Assessment   Upper Extremity Assessment Upper Extremity Assessment: Defer to OT evaluation RUE  Deficits / Details: grossly 2+/5 LUE Deficits / Details: grossly 2/5, requires cueing to attend and attempt to use LUE LUE Coordination: decreased gross motor;decreased fine motor    Lower Extremity Assessment Lower Extremity Assessment: Generalized weakness;Difficult to assess due to impaired cognition (R LE grossly 3-/5, L residual limb did seem roughly = to R)       Communication   Communication: Expressive difficulties  Cognition Arousal/Alertness: Awake/alert Behavior During Therapy: Restless Overall Cognitive Status: Difficult to assess Area of Impairment: Orientation                 Orientation Level: Disoriented to;Time;Place             General Comments: Pt with inconsistent ability to follow instruction and stay on task, unsure of baseline      General Comments      Exercises     Assessment/Plan    PT Assessment Patient needs continued PT services  PT Problem List Decreased strength;Decreased range of motion;Decreased activity tolerance;Decreased balance;Decreased mobility;Decreased coordination;Decreased cognition;Decreased knowledge of use of DME;Decreased safety awareness;Pain       PT Treatment Interventions DME instruction;Gait training;Functional mobility training;Therapeutic activities;Therapeutic exercise;Balance training;Wheelchair mobility training;Patient/family education;Neuromuscular re-education    PT Goals (Current goals can be found in the Care Plan section)  Acute Rehab PT Goals Patient Stated Goal: go home PT Goal Formulation: Patient unable to participate in goal setting Time For Goal Achievement: 02/03/17 Potential to Achieve Goals: Fair    Frequency Min 2X/week   Barriers to discharge        Co-evaluation               End of Session Equipment Utilized During Treatment: Gait belt Activity Tolerance: Patient limited by fatigue (limited secondary to ability to follow instructions) Patient left: with bed alarm  set;with call bell/phone within reach   PT Visit Diagnosis: Muscle weakness (generalized) (M62.81);Difficulty in walking, not elsewhere classified (R26.2)    Time: 9629-5284 PT Time Calculation (min) (ACUTE ONLY): 21 min   Charges:   PT Evaluation $PT Eval Low Complexity: 1 Procedure     PT G Codes:        Malachi Pro, DPT 01/20/2017, 3:07 PM

## 2017-01-20 NOTE — Progress Notes (Signed)
[employer] - [place] [facility]   PATIENT NAME: [name]    MR#:  [id number]  DATE OF BIRTH:  Feb 09, 1921  SUBJECTIVE:  CHIEF COMPLAINT:   Chief Complaint  Patient presents with  . Weakness   Still has dysarthria. Sleepy Also dysphagia  REVIEW OF SYSTEMS:    Review of Systems  Unable to perform ROS: Mental status change    DRUG ALLERGIES:   Allergies  Allergen Reactions  . Amoxicillin   . Cephalexin     Other reaction(s): Unknown  . Prednisolone   . Prednisone   . Toradol [Ketorolac Tromethamine]   . Tramadol     VITALS:  Blood pressure (!) 172/61, pulse 66, temperature 98.7 F (37.1 C), temperature source Oral, resp. rate 18, height  (1.753 m), weight 73.9 kg (162 lb 14.4 oz), SpO2 97 %.  PHYSICAL EXAMINATION:   Physical Exam  GENERAL:  83 y.o.-year-old patient lying in the bed with no acute distress.  EYES: Pupils equal, round, reactive to light and accommodation. No scleral icterus. Extraocular muscles intact.  HEENT: Head atraumatic, normocephalic. Oropharynx and nasopharynx clear.  NECK:  Supple, no jugular venous distention. No thyroid enlargement, no tenderness.  LUNGS: Normal breath sounds bilaterally, no wheezing, rales, rhonchi. No use of accessory muscles of respiration.  CARDIOVASCULAR: S1, S2 normal. No murmurs, rubs, or gallops.  ABDOMEN: Soft, nontender, nondistended. Bowel sounds present. No organomegaly or mass.  EXTREMITIES: No cyanosis, clubbing or edema b/l.    NEUROLOGIC: L facial droop. LUE and LLE 4/5 motor.Dysarthria   PSYCHIATRIC: The patient is sleepy SKIN: No obvious rash, lesion, or ulcer.   LABORATORY PANEL:   CBC  Recent Labs Lab 01/19/17 1646  WBC 7.4  HGB 13.5  HCT 39.6  PLT 241   ------------------------------------------------------------------------------------------------------------------ Chemistries   Recent Labs Lab 01/19/17 1336 01/19/17 1646  NA 140  --   K 4.1  --   CL  103  --   CO2 30  --   GLUCOSE 120*  --   BUN 21*  --   CREATININE 0.72 0.66  CALCIUM 9.2  --   AST 29  --   ALT 16  --   ALKPHOS 63  --   BILITOT 0.8  --    ------------------------------------------------------------------------------------------------------------------  Cardiac Enzymes  Recent Labs Lab 01/19/17 1336  TROPONINI <0.03   ------------------------------------------------------------------------------------------------------------------  RADIOLOGY:  Mr Brain Wo Contrast  Result Date: 01/20/2017 CLINICAL DATA:  Left facial droop.  Slurred speech. EXAM: MRI HEAD WITHOUT CONTRAST TECHNIQUE: Multiplanar, multiecho pulse sequences of the brain and surrounding structures were obtained without intravenous contrast. COMPARISON:  Head CT from yesterday FINDINGS: Sagittal T1 weighted imaging and axial and coronal diffusion imaging was acquired. Subsequent to this, patient terminated the exam. DWI hyperintensities in the right thalamus, posteromedial right temporal lobe, inferior right occipital cortex, and likely along the atrium of the right lateral ventricle. These are convincingly dark on ADC map, allowing for motion artifact. No gross hemorrhage or collection.  Atrophy that is mild for age. Moderate ligamentous thickening behind the dens. No cervicomedullary compression. IMPRESSION: 1. Small acute right PCA infarcts in the right thalamus, medial right temporal lobe, and right occipital lobe. 2. Incomplete motion degraded study. Only T1 and diffusion sequences were acquired before the patient terminated exam. Electronically Signed   By: Marnee Spring M.D.   On: 01/20/2017 11:13   US Carotid Bilateral (at [facility] Only)  Result Date: 01/20/2017 CLINICAL DATA:  Acute CVA. EXAM:  BILATERAL CAROTID DUPLEX ULTRASOUND TECHNIQUE: Wallace Cullens scale imaging, color Doppler and duplex ultrasound were performed of bilateral carotid and vertebral arteries in the neck. COMPARISON:  MRI 01/20/2017  FINDINGS: Criteria: Quantification of carotid stenosis is based on velocity parameters that correlate the residual internal carotid diameter with NASCET-based stenosis levels, using the diameter of the distal internal carotid lumen as the denominator for stenosis measurement. The following velocity measurements were obtained: RIGHT ICA:  47 cm/sec (distal only) CCA:  66 cm/sec SYSTOLIC ICA/CCA RATIO:  0.7 DIASTOLIC ICA/CCA RATIO:  3.5 ECA:  70 cm/sec LEFT ICA:  307 cm/sec CCA:  95 cm/sec SYSTOLIC ICA/CCA RATIO:  3.2 DIASTOLIC ICA/CCA RATIO:  3.9 ECA:  100 cm/sec RIGHT CAROTID ARTERY: High resistant waveforms in the right common carotid artery concerning for a distal occlusion or stenosis. Large amount of echogenic plaque at the right carotid bulb. Echogenic plaque in the proximal external carotid artery. Right external carotid artery is patent. There is no flow identified in the proximal or mid right internal carotid artery but there is extensive acoustic shadowing in this area and which limits evaluation. There is flow in the distal right internal iliac artery but waveform has a delayed upstroke. RIGHT VERTEBRAL ARTERY: Antegrade flow and normal waveform in the right vertebral artery. LEFT CAROTID ARTERY: Large amount of plaque in the left common carotid artery. Large amount of plaque at the left carotid bulb. Extensive shadowing due to calcified plaque at the left carotid bulb and proximal internal carotid artery. Shadowing in the proximal external carotid artery. Markedly elevated peak systolic velocity in the left internal carotid artery measuring up to 307 cm/sec. LEFT VERTEBRAL ARTERY: Antegrade flow and normal waveform in the left vertebral artery. IMPRESSION: Severe atherosclerotic disease involving bilateral carotid arteries. No flow identified in the proximal and mid right internal carotid artery but difficult to evaluate due to shadowing plaque. Findings are concerning for a critical stenosis or segmental  occlusion in the proximal right internal carotid artery. High-grade stenosis in the left internal carotid artery measuring greater than 70%. Difficult to assess the disease in the left internal carotid artery due to extensive shadowing plaque. Carotid arteries could be better characterized with MRA or CTA of the neck if needed. Patent vertebral arteries. These results will be called to the ordering clinician or representative by the Radiologist Assistant, and communication documented in the PACS or zVision Dashboard. Electronically Signed   By: Richarda Overlie M.D.   On: 01/20/2017 12:32   Ct Head Code Stroke W/o Cm  Result Date: 01/19/2017 CLINICAL DATA:  Code stroke. LEFT-sided facial drooping and slurred speech which began earlier today. aphasia. EXAM: CT HEAD WITHOUT CONTRAST TECHNIQUE: Contiguous axial images were obtained from the base of the skull through the vertex without intravenous contrast. COMPARISON:  11/21/2014. FINDINGS: Brain: No evidence for acute infarction, hemorrhage, mass lesion, hydrocephalus, or extra-axial fluid. Moderate cerebral and cerebellar atrophy, not unexpected for age. Hypoattenuation of white matter consistent with chronic microvascular ischemic change. Partial empty sella. Vascular: Vascular calcification affecting the skull base internal carotid arteries as well as the distal basilar artery. No findings conclusive for intracranial large vessel occlusion. Skull: Negative for fracture or focal skull abnormality. Degenerative changes prominent at radiologic the C1-2 articulation. Sinuses/Orbits: No acute finding. Other: None. ASPECTS [facility] Stroke Program Early CT Score) - Ganglionic level infarction (caudate, lentiform nuclei, internal capsule, insula, M1-M3 cortex): 7 - Supraganglionic infarction (M4-M6 cortex): 3 Total score (0-10 with 10 being normal): 10 IMPRESSION: 1. Atrophy and small vessel  disease. No acute intracranial findings. 2. ASPECTS is 10. These results were called  by telephone at the time of interpretation on 01/19/2017 at 1:59 pm to Dr. Daryel November , who verbally acknowledged these results. Electronically Signed   By: Elsie Stain M.D.   On: 01/19/2017 14:01     ASSESSMENT AND PLAN:   * Acute right PCA territory CVAs -  Carotid dopplers, Echo - aspirin and statin. - Lovenox for DVT prophylaxis. - PT/OT/Speech consult as needed per symptoms - Neuro checks every 4 hours for 24 hours. - Consult neurology. Appreciate input SNF  * Left carotid stenosis Will need medical management due to advanced age. Will refer to vascular as OP  * HTN Continue home meds  * pAfib On eliquis    All the records are reviewed and case discussed with Care Management/Social Workerr. Management plans discussed with the patient, family and they are in agreement.  CODE STATUS: DNR  DVT Prophylaxis: SCDs  TOTAL TIME TAKING CARE OF THIS PATIENT: 35 minutes.   POSSIBLE D/C IN 1-2 DAYS, DEPENDING ON CLINICAL CONDITION.  Milagros Loll R M.D on 01/20/2017 at 5:55 PM  Between 7am to 6pm - Pager - [phone]  After 6pm go to [email or web address] - password [email or web address] [facility]  [facility]  Office  [phone]  CC: Primary care physician; Dorothey Baseman, MD  Note: This dictation was prepared with Dragon dictation along with smaller phrase technology. Any transcriptional errors that result from this process are unintentional.

## 2017-01-20 NOTE — Evaluation (Signed)
Occupational Therapy Evaluation Patient Details Name: [name] MRN: [id number] DOB: 1920-11-29 Today's Date: 01/20/2017    History of Present Illness 83 y.o.femalewith a known history of Atrial fibrillation on anticoagulation with eliquis, DM, CHF, dysrhythmia, HTN, PVD, and previous LLE AKA who presents to ED on 4/25 with slurry speech and L facial droop and feeling dizzy and leaning towards L side.    Clinical Impression   Pt seen for OT evaluation this date. Pt was fairly independent from wc level at baseline living with daughter in law and son with ramp to enter home, a hospital bed for pt, and pt able to manage medications, do light meal prep, and toilet transfers independently for the most part. Pt required assist from family for LB dressing, using slide board to get into electric grocery carts. Pt presents with possible L neglect vs L eye visual field impairment, decreased strength L>R, decreased coordination, activity tolerance, safety awareness, and HOH. Pt able to put in dentures after set up from OT using R hand. Pt had difficulty with attending to anything presented to her on her L side, requiring verbal cues to turn head and look for items. Pt with slight lateral L lean in bed requiring pillows to proper up, as well as L facial droop, and some slurring speech. 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 OT spoke with daughter in law and son over the phone to obtain pt history due to pt poor historian. Pt originally from [place]. She enjoys sudoku puzzles and playing gin rummy. Pt will benefit from skilled OT services to address noted impairments and functional deficits in order to maximize return to PLOF.     Follow Up Recommendations  SNF    Equipment Recommendations       Recommendations for Other Services       Precautions / Restrictions Precautions Precautions: Fall Restrictions Weight Bearing Restrictions: Yes LLE Weight Bearing: Non weight bearing (L AKA)       Mobility Bed Mobility Overal bed mobility: Needs Assistance Bed Mobility: Supine to Sit     Supine to sit: Min assist        Transfers                 General transfer comment: deferred due to being taken to MRI    Balance Overall balance assessment: Needs assistance     Sitting balance - Comments: L lateral lean while in long sitting in bed with HOB elevated and requiring pillows to support upright posture and limit L lateral lean                                   ADL either performed or assessed with clinical judgement   ADL Overall ADL's : Needs assistance/impaired Eating/Feeding: NPO Eating/Feeding Details (indicate cue type and reason): pt noted to cough when given sip of water from RN, tolerated crushed meds in applesause with no overt s/s of choking; notified dining services to hold off on meal trays until SLP has chance to evaluate Grooming: Minimal assistance;Cueing for sequencing;Bed level   Upper Body Bathing: Moderate assistance;Bed level;Cueing for sequencing   Lower Body Bathing: Maximal assistance;Cueing for sequencing;Bed level   Upper Body Dressing : Moderate assistance;Bed level;Cueing for sequencing   Lower Body Dressing: Maximal assistance;Bed level;Cueing for sequencing     Toilet Transfer Details (indicate cue type and reason): did not assess, deferred due to safety  and being taken for MRI           General ADL Comments: pt generally max assist for LB  ADL, mod assist for UB ADL with cueing for L side participation/attention     Vision   Eye Alignment: Within Functional Limits Ocular Range of Motion: Impaired-to be further tested in functional context Alignment/Gaze Preference: Gaze right (gaze slightly right) Tracking/Visual Pursuits: Impaired - to be further tested in functional context (requires cues to continue, particularly on L side)     Perception     Praxis      Pertinent Vitals/Pain Pain Assessment:  No/denies pain     Hand Dominance     Extremity/Trunk Assessment Upper Extremity Assessment Upper Extremity Assessment: LUE deficits/detail;RUE deficits/detail RUE Deficits / Details: grossly 2+/5 LUE Deficits / Details: grossly 2/5, requires cueing to attend and attempt to use LUE LUE Coordination: decreased gross motor;decreased fine motor   Lower Extremity Assessment Lower Extremity Assessment: Defer to PT evaluation       Communication Communication Communication: Expressive difficulties (L facial droop)   Cognition Arousal/Alertness: Awake/alert Behavior During Therapy: WFL for tasks assessed/performed Overall Cognitive Status: Impaired/Different from baseline Area of Impairment: Orientation                 Orientation Level: Disoriented to;Time;Place             General Comments: pt will possible L neglect vs L eye visual impairment, decreased attention to L side, requires cueing to turn head to find therapist on L side   General Comments       Exercises     Shoulder Instructions      Home Living Family/patient expects to be discharged to:: Private residence Living Arrangements: Children (lives w/ son and daughter in Social worker) Available Help at Discharge: Family;Available 24 hours/day Type of Home: House Home Access: Ramped entrance     Home Layout: One level     Bathroom Shower/Tub: Walk-in shower (handicap accessible)   Bathroom Toilet: Handicapped height Bathroom Accessibility: Yes How Accessible: Accessible via walker;Accessible via wheelchair Home Equipment: Wheelchair - manual;Bedside commode;Other (comment) (slide board, bed pan, hospital bed)          Prior Functioning/Environment Level of Independence: Needs assistance  Gait / Transfers Assistance Needed: pt generally indep from w/c level, able to perform wc<>elevated toilet transfers indep with PRN assist for slide board transfers when going wc<>electric shopping carts at the grocery  store ADL's / Homemaking Assistance Needed: pt needs some assist with LB dressing (rolling side to side in bed), bathing, can do light meal prep indep from wc, manages meds indep, shops for her own food once in the electric shopping cart, indep with bed pan use overnight and then empties it into the BSC.            OT Problem List: Decreased strength;Decreased coordination;Decreased cognition;Decreased range of motion;Decreased safety awareness;Decreased activity tolerance;Impaired balance (sitting and/or standing);Decreased knowledge of use of DME or AE;Impaired UE functional use      OT Treatment/Interventions: Self-care/ADL training;Energy conservation;Patient/family education;DME and/or AE instruction;Balance training    OT Goals(Current goals can be found in the care plan section) Acute Rehab OT Goals Patient Stated Goal: feel better OT Goal Formulation: With patient Time For Goal Achievement: 02/03/17 Potential to Achieve Goals: Good  OT Frequency: Min 1X/week   Barriers to D/C:            Co-evaluation  End of Session    Activity Tolerance: Patient tolerated treatment well Patient left: in bed;with call bell/phone within reach;with bed alarm set;with nursing/sitter in room (staff to transport to MRI)  OT Visit Diagnosis: Muscle weakness (generalized) (M62.81);Hemiplegia and hemiparesis;Other symptoms and signs involving cognitive function Hemiplegia - Right/Left: Left Hemiplegia - dominant/non-dominant: Non-Dominant Hemiplegia - caused by: Unspecified                Time: 0935-1005 OT Time Calculation (min): 30 min Charges:  OT General Charges $OT Visit: 1 Procedure OT Evaluation $OT Eval Moderate Complexity: 1 Procedure OT Treatments $Self Care/Home Management : 8-22 mins G-Codes:     Richrd Prime, MPH, MS, OTR/L ascom [phone] 01/20/17, 1:50 PM

## 2017-01-20 NOTE — [place] FL2 (Signed)
MEDICAID FL2 LEVEL OF CARE SCREENING TOOL     IDENTIFICATION  Patient Name: Stephani Shewmake Birthdate: [birth year] Sex: female Admission Date (Current Location): 01/19/2017  [place] and [place] Number:  Chiropodist and Address:  [facility], [address] [place], [place] [address]      Provider Number: [id number]  Attending Physician Name and Address:  Milagros Loll, MD  Relative Name and Phone Number:       Current Level of Care: Hospital Recommended Level of Care: Skilled Nursing Facility Prior Approval Number:    Date Approved/Denied:   PASRR Number: [id number]  Discharge Plan: SNF    Current Diagnoses: Patient Active Problem List   Diagnosis Date Noted  . Acute CVA (cerebrovascular accident) (HCC) 01/19/2017  . Cellulitis 02/03/2016  . PVD (peripheral vascular disease) (HCC) 02/03/2016  . HTN (hypertension) 02/03/2016  . A-fib (HCC) 02/03/2016  . CHF (congestive heart failure) (HCC) 02/03/2016  . DM foot (HCC) 05/27/2015  . Ulcer of foot due to diabetes mellitus (HCC) 05/27/2015  . Diabetic foot ulcer (HCC) 05/27/2015    Orientation RESPIRATION BLADDER Height & Weight     Self, Place  Normal Incontinent Weight: 162 lb 14.4 oz (73.9 kg) Height:   (175.3 cm)  BEHAVIORAL SYMPTOMS/MOOD NEUROLOGICAL BOWEL NUTRITION STATUS      Continent Diet  AMBULATORY STATUS COMMUNICATION OF NEEDS Skin   Extensive Assist Verbally Normal                       Personal Care Assistance Level of Assistance  Bathing, Feeding, Dressing Bathing Assistance: Limited assistance Feeding assistance: Independent Dressing Assistance: Limited assistance     Functional Limitations Info  Sight, Hearing, Speech Sight Info: Adequate Hearing Info: Adequate Speech Info: Adequate    SPECIAL CARE FACTORS FREQUENCY  PT (By licensed PT), OT (By licensed OT), Speech therapy     PT Frequency: 5 OT Frequency: 5     Speech Therapy  Frequency: 5      Contractures Contractures Info: Not present    Additional Factors Info  Code Status, Allergies, Insulin Sliding Scale Code Status Info: DNR Allergies Info: Amoxicillin, Cephalexin, Prednisolone, Prednisone, Toradol Ketorolac Tromethamine, Tramadol   Insulin Sliding Scale Info: 4x/day       Current Medications (01/20/2017):  This is the current hospital active medication list Current Facility-Administered Medications  Medication Dose Route Frequency Provider Last Rate Last Dose  . acetaminophen (TYLENOL) tablet 650 mg  650 mg Oral Q4H PRN Adrian Saran, MD       Or  . acetaminophen (TYLENOL) solution 650 mg  650 mg Per Tube Q4H PRN Adrian Saran, MD       Or  . acetaminophen (TYLENOL) suppository 650 mg  650 mg Rectal Q4H PRN Adrian Saran, MD      . apixaban (ELIQUIS) tablet 5 mg  5 mg Oral BID Adrian Saran, MD   5 mg at 01/20/17 0929  . cholecalciferol (VITAMIN D) tablet 5,000 Units  5,000 Units Oral Daily Adrian Saran, MD   5,000 Units at 01/20/17 0931  . digoxin (LANOXIN) tablet 0.0625 mg  0.0625 mg Oral Daily Adrian Saran, MD   0.0625 mg at 01/20/17 0929  . feeding supplement (ENSURE ENLIVE) (ENSURE ENLIVE) liquid 237 mL  237 mL Oral BID BM Sital Mody, MD   237 mL at 01/20/17 1000  . ferrous sulfate tablet 325 mg  325 mg Oral Daily Adrian Saran, MD   325 mg  at 01/20/17 0931  . hydrALAZINE (APRESOLINE) injection 10 mg  10 mg Intravenous Q6H PRN Sital Mody, MD      . insulin aspart (novoLOG) injection 0-5 Units  0-5 Units Subcutaneous QHS Sital Mody, MD      . insulin aspart (novoLOG) injection 0-9 Units  0-9 Units Subcutaneous TID WC Sital Mody, MD      . pyridOXINE (VITAMIN B-6) tablet 100 mg  100 mg Oral Daily Adrian Saran, MD   100 mg at 01/20/17 0932  . selenium tablet 200 mcg  200 mcg Oral Daily Adrian Saran, MD   200 mcg at 01/20/17 0937  . senna-docusate (Senokot-S) tablet 1 tablet  1 tablet Oral QHS PRN Adrian Saran, MD      . vitamin B-12 (CYANOCOBALAMIN) tablet 1,000 mcg   1,000 mcg Oral Daily Adrian Saran, MD   1,000 mcg at 01/20/17 0932  . zinc sulfate capsule 220 mg  220 mg Oral Daily Adrian Saran, MD   220 mg at 01/20/17 0932     Discharge Medications: Please see discharge summary for a list of discharge medications.  Relevant Imaging Results:  Relevant Lab Results:   Additional Information SSN:  [id number]  Dede Query, LCSW

## 2017-01-20 NOTE — Evaluation (Signed)
Clinical/Bedside Swallow Evaluation Patient Details  Name: [name] MRN: [id number] Date of Birth: [birth year]  Today's Date: 01/20/2017 Time: SLP Start Time (ACUTE ONLY): 1400 SLP Stop Time (ACUTE ONLY): 1500 SLP Time Calculation (min) (ACUTE ONLY): 60 min  Past Medical History:  Past Medical History:  Diagnosis Date  . Atrial fibrillation (HCC)   . CHF (congestive heart failure) (HCC)   . Diabetes mellitus without complication (HCC)   . Dysrhythmia   . Foot ulcer, right (HCC)   . Gangrene of toe (HCC)   . Gout   . Hypertension   . Peripheral vascular disease [facility])    Past Surgical History:  Past Surgical History:  Procedure Laterality Date  . EYE SURGERY    . JOINT REPLACEMENT    . LEG AMPUTATION THROUGH KNEE Left   . PERIPHERAL VASCULAR CATHETERIZATION Right 04/08/2015   Procedure: Lower Extremity Angiography;  Surgeon: Renford Dills, MD;  Location: [facility] INVASIVE CV LAB;  Service: Cardiovascular;  Laterality: Right;  . PERIPHERAL VASCULAR CATHETERIZATION  04/08/2015   Procedure: Lower Extremity Intervention;  Surgeon: Renford Dills, MD;  Location: [facility] INVASIVE CV LAB;  Service: Cardiovascular;;  . PERIPHERAL VASCULAR CATHETERIZATION N/A 07/15/2015   Procedure: Abdominal Aortogram w/Lower Extremity;  Surgeon: Renford Dills, MD;  Location: [facility] INVASIVE CV LAB;  Service: Cardiovascular;  Laterality: N/A;  . PERIPHERAL VASCULAR CATHETERIZATION  07/15/2015   Procedure: Lower Extremity Intervention;  Surgeon: Renford Dills, MD;  Location: [facility] INVASIVE CV LAB;  Service: Cardiovascular;;  . PERIPHERAL VASCULAR CATHETERIZATION Right 02/09/2016   Procedure: Lower Extremity Angiography;  Surgeon: Annice Needy, MD;  Location: [facility] INVASIVE CV LAB;  Service: Cardiovascular;  Laterality: Right;  . PERIPHERAL VASCULAR CATHETERIZATION  02/09/2016   Procedure: Lower Extremity Intervention;  Surgeon: Annice Needy, MD;  Location: [facility] INVASIVE CV LAB;  Service: Cardiovascular;;   . Right Knee replacement     HPI:  Noted MRI results indicating Small acute right PCA infarcts in the right thalamus, medial right temporal and right occipital infarcts; Carotid study indicated Severe atherosclerotic disease involving bilateral carotid arteries.   Assessment / Plan / Recommendation Clinical Impression  Pt appears at min increased risk for aspiration moreso w/ thin liquids d/t pharyngeal phase dysphagia; min facial/labial weakness on Left side; pt also exhibits LUE weakness. Pt does have some dysarthria but moreso in sentence length communication(connected words); articulation/intelligibility most adequate at word level. Pt exhibited no overt oral phase deficits w/ solids; immeidate, strong cough(post audible, hard swallow) w/ trial of thin liquids x1/6. She appeared to easily tolerate the Nectar consistency liquids w/ no overt defiits noted. Pt fed self but required moderate assistance for support d/t LUE weakness(which is improving per staff). Noted Carotid study and MRI results.  SLP Visit Diagnosis: Dysphagia, pharyngeal phase (R13.13)    Aspiration Risk  Mild aspiration risk;Moderate aspiration risk    Diet Recommendation  Dysphagia level 2(minced meats/foods) moistened well; NECTAR consistency liquids. General aspiration precautions; feeding support d/t LUE weakness.  Medication Administration: Crushed with puree    Other  Recommendations Recommended Consults:  (Dietician f/u) Oral Care Recommendations: Oral care BID;Staff/trained caregiver to provide oral care Other Recommendations: Order thickener from pharmacy;Prohibited food (jello, ice cream, thin soups);Remove water pitcher;Have oral suction available   Follow up Recommendations Skilled Nursing facility      Frequency and Duration min 3x week  2 weeks       Prognosis Prognosis for Safe Diet Advancement: Fair Barriers to Reach Goals:  (older-elderly age)  Swallow Study   General Date of Onset:  01/19/17 HPI: Noted MRI results indicating Small acute right PCA infarcts in the right thalamus, medial right temporal and right occipital infarcts; Carotid study indicated Severe atherosclerotic disease involving bilateral carotid arteries. Type of Study: Bedside Swallow Evaluation Previous Swallow Assessment: none indicated Diet Prior to this Study:  (unknown - suspect regular as ordered at admission) Temperature Spikes Noted: No (wbc not elevated) Respiratory Status: Room air History of Recent Intubation: No Behavior/Cognition: Alert;Cooperative;Pleasant mood;Distractible;Requires cueing (vision and Left UE deficits) Oral Cavity Assessment: Within Functional Limits Oral Care Completed by SLP: Recent completion by staff Oral Cavity - Dentition: Dentures, top;Dentures, bottom Vision:  (min impaired) Self-Feeding Abilities: Able to feed self;Able to feed self with adaptive devices;Needs assist;Needs set up;Total assist Patient Positioning: Upright in bed Baseline Vocal Quality: Normal (slightly mumlbed; more dysarthric at sentence level) Volitional Cough: Strong Volitional Swallow: Able to elicit    Oral/Motor/Sensory Function Overall Oral Motor/Sensory Function: Mild impairment Facial ROM: Reduced left (min) Facial Symmetry: Abnormal symmetry left (min) Lingual ROM: Within Functional Limits Lingual Symmetry: Within Functional Limits Lingual Strength: Within Functional Limits Velum: Within Functional Limits Mandible: Within Functional Limits   Ice Chips Ice chips: Within functional limits Presentation: Spoon (fed; 3 trials)   Thin Liquid Thin Liquid: Impaired Presentation: Cup;Self Fed (6 trials total) Oral Phase Impairments:  (none) Oral Phase Functional Implications:  (none) Pharyngeal  Phase Impairments: Cough - Immediate (x1/6 trials; audible) Other Comments: less controlled    Nectar Thick Nectar Thick Liquid: Within functional limits Presentation: Cup;Self Fed Other  Comments: consumed ~4 ozs total   Honey Thick Honey Thick Liquid: Not tested   Puree Puree: Within functional limits Presentation: Self Fed;Spoon (assisted; ~6 ozs)   Solid   GO   Solid: Within functional limits (mech soft trials - softened well) Presentation: Self Fed;Spoon (assisted; 8 trials)         Jerilynn Som, MS, CCC-SLP Watson,Katherine 01/20/2017,5:37 PM

## 2017-01-21 LAB — HEMOGLOBIN A1C
Hgb A1c MFr Bld: 6.1 % — ABNORMAL HIGH (ref 4.8–5.6)
Mean Plasma Glucose: 128 mg/dL

## 2017-01-21 LAB — GLUCOSE, CAPILLARY
GLUCOSE-CAPILLARY: 100 mg/dL — AB (ref 65–99)
GLUCOSE-CAPILLARY: 152 mg/dL — AB (ref 65–99)
GLUCOSE-CAPILLARY: 108 mg/dL — AB (ref 65–99)
Glucose-Capillary: 100 mg/dL — ABNORMAL HIGH (ref 65–99)

## 2017-01-21 MED ORDER — METOPROLOL TARTRATE 50 MG PO TABS
50.0000 mg | ORAL_TABLET | Freq: Two times a day (BID) | ORAL | Status: DC
  Administered 2017-01-21 – 2017-01-22 (×3): 50 mg via ORAL
  Filled 2017-01-21 (×3): qty 1

## 2017-01-21 MED ORDER — ATORVASTATIN CALCIUM 10 MG PO TABS: 10.0000 mg | ORAL_TABLET | Freq: Every day | ORAL | Status: AC

## 2017-01-21 MED ORDER — FUROSEMIDE 40 MG PO TABS: 40.0000 mg | ORAL_TABLET | Freq: Every day | ORAL | 0 refills | Status: AC

## 2017-01-21 MED ORDER — METOPROLOL TARTRATE 25 MG PO TABS: 25.0000 mg | ORAL_TABLET | Freq: Two times a day (BID) | ORAL | Status: DC

## 2017-01-21 NOTE — Plan of Care (Signed)
Problem: Education: Goal: Knowledge of [facility] Education information/materials will improve Outcome: Progressing VSS, free of falls during shift.  Neuro checks stable, NIH 5.  Denies pain, except when moving in bed.  No needs overnight.  Bed in low position, call bell within reach.  WCTM.

## 2017-01-21 NOTE — Discharge Summary (Addendum)
[employer] [facility] [place] [facility]   PATIENT NAME: [name]    MR#:  [id number]  DATE OF BIRTH:  03/16/1921  DATE OF ADMISSION:  01/19/2017 ADMITTING PHYSICIAN: Adrian Saran, MD  DATE OF DISCHARGE: 01/22/2017  PRIMARY CARE PHYSICIAN: Dorothey Baseman, MD   ADMISSION DIAGNOSIS:  Other specified transient cerebral ischemias [G45.8] Stroke (cerebrum) (HCC) [I63.9]  DISCHARGE DIAGNOSIS:  Active Problems:   Acute CVA (cerebrovascular accident) (HCC)   SECONDARY DIAGNOSIS:   Past Medical History:  Diagnosis Date  . Atrial fibrillation (HCC)   . CHF (congestive heart failure) (HCC)   . Diabetes mellitus without complication (HCC)   . Dysrhythmia   . Foot ulcer, right (HCC)   . Gangrene of toe (HCC)   . Gout   . Hypertension   . Peripheral vascular disease (HCC)      ADMITTING HISTORY  HISTORY OF PRESENT ILLNESS:  [name]  is a 83 y.o. female with a known history of Atrial fibrillation on anticoagulation and diabetes who presents with above complaint. Daughter reports that this morning at approximately 10 AM patient started having left-sided facial droop with slurred speech. She was also feeling dizzy and was leaning towards the left side. EMS was called and patient was brought to the ER for further evaluation. Patient continues to have some slowing of her speech was slurred speech and decreased sensation on left side of her face with decreased strength in left upper extremity. CT the head was negative for acute CVA   HOSPITAL COURSE:   * Acute right PCA territory CVAs Secondary to atrial fibrillation Echocardiogram showed no intramural thrombus - aspirin and statin, Eliquis PT and OT work with patient. Will need skilled nursing facility. - Discussed with neurology. SNF at discharge  * Left carotid stenosis Will need medical management due to advanced age. Will refer to vascular as OP regarding need for intervention Dr. Gilda Crease  *  HTN Continue home meds  * pAfib On eliquis  * Dysphagia due to CVA. On dysphagia 2 diet with nectar thickened liquids  Stable for discharge to SNF  Will need PT/OT/Speech  CONSULTS OBTAINED:  Treatment Team:  Pauletta Browns, MD Kym Groom, MD  DRUG ALLERGIES:   Allergies  Allergen Reactions  . Amoxicillin   . Cephalexin     Other reaction(s): Unknown  . Prednisolone   . Prednisone   . Toradol [Ketorolac Tromethamine]   . Tramadol     DISCHARGE MEDICATIONS:   Current Discharge Medication List    START taking these medications   Details  atorvastatin (LIPITOR) 10 MG tablet Take 1 tablet (10 mg total) by mouth daily at 6 PM.      CONTINUE these medications which have CHANGED   Details  furosemide (LASIX) 40 MG tablet Take 1 tablet (40 mg total) by mouth daily. Qty: 30 tablet, Refills: 0      CONTINUE these medications which have NOT CHANGED   Details  apixaban (ELIQUIS) 5 MG TABS tablet Take 5 mg by mouth 2 (two) times daily.     Cholecalciferol (VITAMIN D3) 5000 units CAPS Take 1 capsule by mouth daily.    clotrimazole-betamethasone (LOTRISONE) cream Apply 1 application topically daily.    Copper Gluconate 2 MG TABS Take 2 mg by mouth 1 day or 1 dose.    digoxin (LANOXIN) 0.125 MG tablet Take 0.0625 mg by mouth daily.     docusate sodium (COLACE) 100 MG capsule Take 100 mg by mouth 2 (two) times daily  as needed.     ferrous sulfate 325 (65 FE) MG tablet Take 325 mg by mouth daily.     metoprolol (LOPRESSOR) 50 MG tablet Take 1 tablet (50 mg total) by mouth 2 (two) times daily. Qty: 30 tablet, Refills: 0    potassium chloride (K-DUR) 10 MEQ tablet Take 10 mEq by mouth daily.    pyridOXINE (VITAMIN B-6) 100 MG tablet Take 100 mg by mouth daily.    selenium 50 MCG TABS tablet Take 200 mcg by mouth daily.    vitamin B-12 (CYANOCOBALAMIN) 1000 MCG tablet Take 1,000 mcg by mouth daily.    zinc gluconate 50 MG tablet Take 50 mg by mouth daily.     feeding supplement, ENSURE ENLIVE, (ENSURE ENLIVE) LIQD Take 237 mLs by mouth 2 (two) times daily between meals. Qty: 237 mL, Refills: 12    nystatin (MYCOSTATIN) 100000 UNIT/ML suspension Use as directed 5 mLs (500,000 Units total) in the mouth or throat 4 (four) times daily. Qty: 60 mL, Refills: 0    nystatin (NYSTATIN) powder Apply topically 3 (three) times daily. Qty: 15 g, Refills: 0    oxyCODONE (OXY IR/ROXICODONE) 5 MG immediate release tablet Take 1 tablet (5 mg total) by mouth every 6 (six) hours as needed for moderate pain or severe pain. Qty: 20 tablet, Refills: 0        Today   VITAL SIGNS:  Blood pressure 138/68, pulse (!) 49, temperature 98 F (36.7 C), temperature source Oral, resp. rate 20, height 5\' 9"  (1.753 m), weight 65.2 kg (143 lb 12.8 oz), SpO2 98 %.  I/O:    Intake/Output Summary (Last 24 hours) at 01/22/17 1610 Last data filed at 01/21/17 1846  Gross per 24 hour  Intake              600 ml  Output                0 ml  Net              600 ml    PHYSICAL EXAMINATION:  Physical Exam  GENERAL:  83 y.o.-year-old patient lying in the bed with no acute distress.  LUNGS: Normal breath sounds bilaterally, no wheezing, rales,rhonchi or crepitation. No use of accessory muscles of respiration.  CARDIOVASCULAR: S1, S2 normal. No murmurs, rubs, or gallops.  ABDOMEN: Soft, non-tender, non-distended. Bowel sounds present. No organomegaly or mass.  NEUROLOGIC: LUE weakness. PSYCHIATRIC: The patient is alert and oriented x 3.  SKIN: No obvious rash, lesion, or ulcer.   DATA REVIEW:   CBC  Recent Labs Lab 01/19/17 1646  WBC 7.4  HGB 13.5  HCT 39.6  PLT 241    Chemistries   Recent Labs Lab 01/19/17 1336 01/19/17 1646  NA 140  --   K 4.1  --   CL 103  --   CO2 30  --   GLUCOSE 120*  --   BUN 21*  --   CREATININE 0.72 0.66  CALCIUM 9.2  --   AST 29  --   ALT 16  --   ALKPHOS 63  --   BILITOT 0.8  --     Cardiac Enzymes  Recent  Labs Lab 01/19/17 1336  TROPONINI <0.03    Microbiology Results  Results for orders placed or performed during the hospital encounter of 02/03/16  Urine culture     Status: None   Collection Time: 02/03/16  9:14 PM  Result Value Ref Range Status   Specimen  Description URINE, RANDOM  Final   Special Requests Normal  Final   Culture NO GROWTH 2 DAYS  Final   Report Status 02/05/2016 FINAL  Final  Culture, blood (routine x 2)     Status: None   Collection Time: 02/03/16  9:15 PM  Result Value Ref Range Status   Specimen Description BLOOD LEFT HAND  Final   Special Requests BOTTLES DRAWN AEROBIC AND ANAEROBIC  Final   Culture NO GROWTH 5 DAYS  Final   Report Status 02/08/2016 FINAL  Final  Wound culture     Status: None   Collection Time: 02/03/16  9:26 PM  Result Value Ref Range Status   Specimen Description WOUND  Final   Special Requests Normal  Final   Gram Stain   Final    NO WBC SEEN RARE GRAM POSITIVE COCCI FEW GRAM NEGATIVE RODS    Culture   Final    MODERATE GROWTH PROTEUS MIRABILIS MODERATE GROWTH STAPHYLOCOCCUS AUREUS MODERATE GROWTH ESCHERICHIA COLI    Report Status 02/07/2016 FINAL  Final   Organism ID, Bacteria ESCHERICHIA COLI  Final   Organism ID, Bacteria PROTEUS MIRABILIS  Final   Organism ID, Bacteria STAPHYLOCOCCUS AUREUS  Final      Susceptibility   Staphylococcus aureus - MIC*    CIPROFLOXACIN Value in next row Resistant      RESISTANT>=8    ERYTHROMYCIN Value in next row Resistant      RESISTANT>=8    GENTAMICIN Value in next row Sensitive      SENSITIVE<0.5    OXACILLIN Value in next row Sensitive      SENSITIVE0.5    TETRACYCLINE Value in next row Sensitive      SENSITIVE2    VANCOMYCIN Value in next row Sensitive      SENSITIVE1    TRIMETH/SULFA Value in next row Sensitive      SENSITIVE<=10    CLINDAMYCIN Value in next row Resistant      RESISTANT>=8    RIFAMPIN Value in next row Sensitive      SENSITIVE<=0.5    Inducible  Clindamycin Value in next row Sensitive      SENSITIVE<=0.5    * MODERATE GROWTH STAPHYLOCOCCUS AUREUS   Escherichia coli - MIC*    AMPICILLIN Value in next row Resistant      SENSITIVE<=0.5    CEFAZOLIN Value in next row Sensitive      SENSITIVE<=0.5    CEFEPIME Value in next row Sensitive      SENSITIVE<=0.5    CEFTAZIDIME Value in next row Sensitive      SENSITIVE<=0.5    CEFTRIAXONE Value in next row Sensitive      SENSITIVE<=0.5    CIPROFLOXACIN Value in next row Resistant      SENSITIVE<=0.5    GENTAMICIN Value in next row Sensitive      SENSITIVE<=0.5    IMIPENEM Value in next row Sensitive      SENSITIVE<=0.5    TRIMETH/SULFA Value in next row Resistant      SENSITIVE<=0.5    AMPICILLIN/SULBACTAM Value in next row Intermediate      SENSITIVE<=0.5    PIP/TAZO Value in next row Sensitive      SENSITIVE<=0.5    Extended ESBL Value in next row Sensitive      SENSITIVE<=0.5    * MODERATE GROWTH ESCHERICHIA COLI   Proteus mirabilis - MIC*    AMPICILLIN Value in next row Resistant      RESISTANT>=32  CEFAZOLIN Value in next row Sensitive      SENSITIVE<=4    CEFEPIME Value in next row Sensitive      SENSITIVE<=1    CEFTAZIDIME Value in next row Sensitive      SENSITIVE<=1    CEFTRIAXONE Value in next row Sensitive      SENSITIVE<=1    CIPROFLOXACIN Value in next row Sensitive      SENSITIVE1    GENTAMICIN Value in next row Resistant      RESISTANT>=16    IMIPENEM Value in next row Sensitive      SENSITIVE2    TRIMETH/SULFA Value in next row Resistant      RESISTANT>=320    AMPICILLIN/SULBACTAM Value in next row Sensitive      SENSITIVE8    PIP/TAZO Value in next row Sensitive      SENSITIVE<=4    * MODERATE GROWTH PROTEUS MIRABILIS  Anaerobic culture     Status: None   Collection Time: 02/03/16  9:26 PM  Result Value Ref Range Status   Specimen Description LEG  Final   Special Requests Normal  Final   Culture NO ANAEROBES ISOLATED  Final   Report Status  02/07/2016 FINAL  Final  Gastrointestinal Panel by PCR , Stool     Status: None   Collection Time: 02/12/16 12:09 PM  Result Value Ref Range Status   Campylobacter species NOT DETECTED NOT DETECTED Final   Plesimonas shigelloides NOT DETECTED NOT DETECTED Final   Salmonella species NOT DETECTED NOT DETECTED Final   Yersinia enterocolitica NOT DETECTED NOT DETECTED Final   Vibrio species NOT DETECTED NOT DETECTED Final   Vibrio cholerae NOT DETECTED NOT DETECTED Final   Enteroaggregative E coli (EAEC) NOT DETECTED NOT DETECTED Final   Enteropathogenic E coli (EPEC) NOT DETECTED NOT DETECTED Final   Enterotoxigenic E coli (ETEC) NOT DETECTED NOT DETECTED Final   Shiga like toxin producing E coli (STEC) NOT DETECTED NOT DETECTED Final   E. coli O157 NOT DETECTED NOT DETECTED Final   Shigella/Enteroinvasive E coli (EIEC) NOT DETECTED NOT DETECTED Final   Cryptosporidium NOT DETECTED NOT DETECTED Final   Cyclospora cayetanensis NOT DETECTED NOT DETECTED Final   Entamoeba histolytica NOT DETECTED NOT DETECTED Final   Giardia lamblia NOT DETECTED NOT DETECTED Final   Adenovirus F40/41 NOT DETECTED NOT DETECTED Final   Astrovirus NOT DETECTED NOT DETECTED Final   Norovirus GI/GII NOT DETECTED NOT DETECTED Final   Rotavirus A NOT DETECTED NOT DETECTED Final   Sapovirus (I, II, IV, and V) NOT DETECTED NOT DETECTED Final  C difficile quick scan w PCR reflex     Status: None   Collection Time: 02/12/16 12:09 PM  Result Value Ref Range Status   C Diff antigen NEGATIVE NEGATIVE Final   C Diff toxin NEGATIVE NEGATIVE Final   C Diff interpretation Negative for C. difficile  Final    RADIOLOGY:  Mr Brain Wo Contrast  Result Date: 01/20/2017 CLINICAL DATA:  Left facial droop.  Slurred speech. EXAM: MRI HEAD WITHOUT CONTRAST TECHNIQUE: Multiplanar, multiecho pulse sequences of the brain and surrounding structures were obtained without intravenous contrast. COMPARISON:  Head CT from yesterday  FINDINGS: Sagittal T1 weighted imaging and axial and coronal diffusion imaging was acquired. Subsequent to this, patient terminated the exam. DWI hyperintensities in the right thalamus, posteromedial right temporal lobe, inferior right occipital cortex, and likely along the atrium of the right lateral ventricle. These are convincingly dark on ADC map, allowing for motion artifact. No  gross hemorrhage or collection.  Atrophy that is mild for age. Moderate ligamentous thickening behind the dens. No cervicomedullary compression. IMPRESSION: 1. Small acute right PCA infarcts in the right thalamus, medial right temporal lobe, and right occipital lobe. 2. Incomplete motion degraded study. Only T1 and diffusion sequences were acquired before the patient terminated exam. Electronically Signed   By: Marnee Spring M.D.   On: 01/20/2017 11:13   US Carotid Bilateral (at [facility] Only)  Result Date: 01/20/2017 CLINICAL DATA:  Acute CVA. EXAM: BILATERAL CAROTID DUPLEX ULTRASOUND TECHNIQUE: Wallace Cullens scale imaging, color Doppler and duplex ultrasound were performed of bilateral carotid and vertebral arteries in the neck. COMPARISON:  MRI 01/20/2017 FINDINGS: Criteria: Quantification of carotid stenosis is based on velocity parameters that correlate the residual internal carotid diameter with NASCET-based stenosis levels, using the diameter of the distal internal carotid lumen as the denominator for stenosis measurement. The following velocity measurements were obtained: RIGHT ICA:  47 cm/sec (distal only) CCA:  66 cm/sec SYSTOLIC ICA/CCA RATIO:  0.7 DIASTOLIC ICA/CCA RATIO:  3.5 ECA:  70 cm/sec LEFT ICA:  307 cm/sec CCA:  95 cm/sec SYSTOLIC ICA/CCA RATIO:  3.2 DIASTOLIC ICA/CCA RATIO:  3.9 ECA:  100 cm/sec RIGHT CAROTID ARTERY: High resistant waveforms in the right common carotid artery concerning for a distal occlusion or stenosis. Large amount of echogenic plaque at the right carotid bulb. Echogenic plaque in the proximal  external carotid artery. Right external carotid artery is patent. There is no flow identified in the proximal or mid right internal carotid artery but there is extensive acoustic shadowing in this area and which limits evaluation. There is flow in the distal right internal iliac artery but waveform has a delayed upstroke. RIGHT VERTEBRAL ARTERY: Antegrade flow and normal waveform in the right vertebral artery. LEFT CAROTID ARTERY: Large amount of plaque in the left common carotid artery. Large amount of plaque at the left carotid bulb. Extensive shadowing due to calcified plaque at the left carotid bulb and proximal internal carotid artery. Shadowing in the proximal external carotid artery. Markedly elevated peak systolic velocity in the left internal carotid artery measuring up to 307 cm/sec. LEFT VERTEBRAL ARTERY: Antegrade flow and normal waveform in the left vertebral artery. IMPRESSION: Severe atherosclerotic disease involving bilateral carotid arteries. No flow identified in the proximal and mid right internal carotid artery but difficult to evaluate due to shadowing plaque. Findings are concerning for a critical stenosis or segmental occlusion in the proximal right internal carotid artery. High-grade stenosis in the left internal carotid artery measuring greater than 70%. Difficult to assess the disease in the left internal carotid artery due to extensive shadowing plaque. Carotid arteries could be better characterized with MRA or CTA of the neck if needed. Patent vertebral arteries. These results will be called to the ordering clinician or representative by the Radiologist Assistant, and communication documented in the PACS or zVision Dashboard. Electronically Signed   By: Richarda Overlie M.D.   On: 01/20/2017 12:32    Follow up with PCP in 1 week.  Management plans discussed with the patient, family and they are in agreement.  CODE STATUS:     Code Status Orders        Start     Ordered   01/19/17  1547  Do not attempt resuscitation (DNR)  Continuous    Question Answer Comment  In the event of cardiac or respiratory ARREST Do not call a "code blue"   In the event of cardiac or  respiratory ARREST Do not perform Intubation, CPR, defibrillation or ACLS   In the event of cardiac or respiratory ARREST Use medication by any route, position, wound care, and other measures to relive pain and suffering. May use oxygen, suction and manual treatment of airway obstruction as needed for comfort.      01/19/17 1547    Code Status History    Date Active Date Inactive Code Status Order ID Comments User Context   02/04/2016 12:28 AM 02/13/2016  7:24 PM DNR [id number]  Oralia Manis, MD Inpatient   02/04/2016 12:17 AM 02/04/2016 12:28 AM Full Code [id number]  Oralia Manis, MD Inpatient   07/15/2015  4:09 PM 07/15/2015  9:52 PM Full Code [id number]  Renford Dills, MD Inpatient   04/08/2015  9:38 AM 04/08/2015  3:24 PM Full Code [id number]  Renford Dills, MD Inpatient      TOTAL TIME TAKING CARE OF THIS PATIENT ON DAY OF DISCHARGE: more than 30 minutes.   Milagros Loll R M.D on 01/22/2017 at 8:23 AM  Between 7am to 6pm - Pager - [phone]  After 6pm go to [email or web address] - password [email or web address] [facility]  [facility] [place] [facility]  Office  [phone]  CC: Primary care physician; Dorothey Baseman, MD  Note: This dictation was prepared with Dragon dictation along with smaller phrase technology. Any transcriptional errors that result from this process are unintentional.

## 2017-01-21 NOTE — Clinical Social Work Note (Addendum)
LATE ENTRY.  PT SEEN ON 4/26 AT 3 PM  Clinical Social Work Assessment  Patient Details  Name: [name] MRN: [id number] Date of Birth: September 13, 1921  Date of referral:  01/20/17               Reason for consult:  Facility Placement, Discharge Planning                Permission sought to share information with:  Family Supports Permission granted to share information::  Yes, Verbal Permission Granted  Name::     Kashina Mecum  Relationship::  daughter in Financial trader Information:  [phone]  Housing/Transportation Living arrangements for the past 2 months:  [place] of Information:  Adult Children Patient Interpreter Needed:  None Criminal Activity/Legal Involvement Pertinent to Current Situation/Hospitalization:  No - Comment as needed Significant Relationships:  Adult Children Lives with:  Adult Children Do you feel safe going back to the place where you live?  Yes Need for family participation in patient care:  Yes (Comment)  Care giving concerns:  No care giving concerns identified.   Social Worker assessment / plan:  CSW met with pt and family to address consult for New SNF as recommended by PT. CSW introduced herself and explained role of social work. CSW also explained the process of discharging to SNF. Pt was sleeping soundly, however pt has been to SNF in the past. Pt's daughter in law would like for pt to return to [facility]. CSW initiated SNF search and will follow upw with bed bed offers. CSW will all reach out to [facility]. CSW will continue to follow.   Employment status:  Retired Forensic scientist:  Medicare PT Recommendations:  [place] / Referral to community resources:  [place]  Patient/Family's Response to care:  Pt's daughter in Sports coach was appreciative of CSW support.   Patient/Family's Understanding of and Emotional Response to Diagnosis, Current Treatment, and Prognosis:  Pt's  daughter in law understands that pt would benefit from STR prior to returning home.   Emotional Assessment Appearance:  Appears stated age Attitude/Demeanor/Rapport:  Other (Sleeping ) Affect (typically observed):  Other (Sleeping) Orientation:  Oriented to Self, Oriented to  Time, Oriented to Place Alcohol / Substance use:  Not Applicable Psych involvement (Current and /or in the community):  No (Comment)  Discharge Needs  Concerns to be addressed:  Adjustment to Illness Readmission within the last 30 days:  No Current discharge risk:  Chronically ill Barriers to Discharge:  Continued Medical Work up   [employer], LCSW 01/21/2017, 12:09 PM

## 2017-01-21 NOTE — Progress Notes (Signed)
[facility]   PATIENT NAME: Paxton Kanaan    MR#:  [id number]  DATE OF BIRTH:  1920/12/05  SUBJECTIVE:  CHIEF COMPLAINT:   Chief Complaint  Patient presents with  . Weakness   Dysarthria is better. Continues to have dysphagia and some left upper extremity weakness.  Family at bedside  REVIEW OF SYSTEMS:    Review of Systems  Constitutional: Negative for chills and fever.  HENT: Negative for sore throat.   Eyes: Negative for blurred vision, double vision and pain.  Respiratory: Negative for cough, hemoptysis, shortness of breath and wheezing.   Cardiovascular: Negative for chest pain, palpitations, orthopnea and leg swelling.  Gastrointestinal: Negative for abdominal pain, constipation, diarrhea, heartburn, nausea and vomiting.  Genitourinary: Negative for dysuria and hematuria.  Musculoskeletal: Negative for back pain and joint pain.  Skin: Negative for rash.  Neurological: Positive for focal weakness. Negative for sensory change, speech change and headaches.  Endo/Heme/Allergies: Does not bruise/bleed easily.  Psychiatric/Behavioral: Negative for depression. The patient is not nervous/anxious.     DRUG ALLERGIES:   Allergies  Allergen Reactions  . Amoxicillin   . Cephalexin     Other reaction(s): Unknown  . Prednisolone   . Prednisone   . Toradol [Ketorolac Tromethamine]   . Tramadol     VITALS:  Blood pressure (!) 126/58, pulse 96, temperature 97.5 F (36.4 C), temperature source Oral, resp. rate 16, height  (1.753 m), weight 73.9 kg (162 lb 14.4 oz), SpO2 94 %.  PHYSICAL EXAMINATION:   Physical Exam  GENERAL:  83 y.o.-year-old patient lying in the bed with no acute distress.  EYES: Pupils equal, round, reactive to light and accommodation. No scleral icterus. Extraocular muscles intact.  HEENT: Head atraumatic, normocephalic. Oropharynx and nasopharynx clear.  NECK:  Supple, no jugular venous distention. No  thyroid enlargement, no tenderness.  LUNGS: Normal breath sounds bilaterally, no wheezing, rales, rhonchi. No use of accessory muscles of respiration.  CARDIOVASCULAR: S1, S2 normal. No murmurs, rubs, or gallops.  ABDOMEN: Soft, nontender, nondistended. Bowel sounds present. No organomegaly or mass.  EXTREMITIES: No cyanosis, clubbing or edema b/l.    NEUROLOGIC: L facial droop. LUE  4/5 motor.Dysarthria. PSYCHIATRIC: The patient is Awake and alert SKIN: No obvious rash, lesion, or ulcer.   LABORATORY PANEL:   CBC  Recent Labs Lab 01/19/17 1646  WBC 7.4  HGB 13.5  HCT 39.6  PLT 241   ------------------------------------------------------------------------------------------------------------------ Chemistries   Recent Labs Lab 01/19/17 1336 01/19/17 1646  NA 140  --   K 4.1  --   CL 103  --   CO2 30  --   GLUCOSE 120*  --   BUN 21*  --   CREATININE 0.72 0.66  CALCIUM 9.2  --   AST 29  --   ALT 16  --   ALKPHOS 63  --   BILITOT 0.8  --    ------------------------------------------------------------------------------------------------------------------  Cardiac Enzymes  Recent Labs Lab 01/19/17 1336  TROPONINI <0.03   ------------------------------------------------------------------------------------------------------------------  RADIOLOGY:  Mr Brain Wo Contrast  Result Date: 01/20/2017 CLINICAL DATA:  Left facial droop.  Slurred speech. EXAM: MRI HEAD WITHOUT CONTRAST TECHNIQUE: Multiplanar, multiecho pulse sequences of the brain and surrounding structures were obtained without intravenous contrast. COMPARISON:  Head CT from yesterday FINDINGS: Sagittal T1 weighted imaging and axial and coronal diffusion imaging was acquired. Subsequent to this, patient terminated the exam. DWI hyperintensities in the right thalamus, posteromedial right temporal lobe, inferior right occipital  cortex, and likely along the atrium of the right lateral ventricle. These are  convincingly dark on ADC map, allowing for motion artifact. No gross hemorrhage or collection.  Atrophy that is mild for age. Moderate ligamentous thickening behind the dens. No cervicomedullary compression. IMPRESSION: 1. Small acute right PCA infarcts in the right thalamus, medial right temporal lobe, and right occipital lobe. 2. Incomplete motion degraded study. Only T1 and diffusion sequences were acquired before the patient terminated exam. Electronically Signed   By: Marnee Spring M.D.   On: 01/20/2017 11:13   US Carotid Bilateral (at [facility] Only)  Result Date: 01/20/2017 CLINICAL DATA:  Acute CVA. EXAM: BILATERAL CAROTID DUPLEX ULTRASOUND TECHNIQUE: Wallace Cullens scale imaging, color Doppler and duplex ultrasound were performed of bilateral carotid and vertebral arteries in the neck. COMPARISON:  MRI 01/20/2017 FINDINGS: Criteria: Quantification of carotid stenosis is based on velocity parameters that correlate the residual internal carotid diameter with NASCET-based stenosis levels, using the diameter of the distal internal carotid lumen as the denominator for stenosis measurement. The following velocity measurements were obtained: RIGHT ICA:  47 cm/sec (distal only) CCA:  66 cm/sec SYSTOLIC ICA/CCA RATIO:  0.7 DIASTOLIC ICA/CCA RATIO:  3.5 ECA:  70 cm/sec LEFT ICA:  307 cm/sec CCA:  95 cm/sec SYSTOLIC ICA/CCA RATIO:  3.2 DIASTOLIC ICA/CCA RATIO:  3.9 ECA:  100 cm/sec RIGHT CAROTID ARTERY: High resistant waveforms in the right common carotid artery concerning for a distal occlusion or stenosis. Large amount of echogenic plaque at the right carotid bulb. Echogenic plaque in the proximal external carotid artery. Right external carotid artery is patent. There is no flow identified in the proximal or mid right internal carotid artery but there is extensive acoustic shadowing in this area and which limits evaluation. There is flow in the distal right internal iliac artery but waveform has a delayed upstroke.  RIGHT VERTEBRAL ARTERY: Antegrade flow and normal waveform in the right vertebral artery. LEFT CAROTID ARTERY: Large amount of plaque in the left common carotid artery. Large amount of plaque at the left carotid bulb. Extensive shadowing due to calcified plaque at the left carotid bulb and proximal internal carotid artery. Shadowing in the proximal external carotid artery. Markedly elevated peak systolic velocity in the left internal carotid artery measuring up to 307 cm/sec. LEFT VERTEBRAL ARTERY: Antegrade flow and normal waveform in the left vertebral artery. IMPRESSION: Severe atherosclerotic disease involving bilateral carotid arteries. No flow identified in the proximal and mid right internal carotid artery but difficult to evaluate due to shadowing plaque. Findings are concerning for a critical stenosis or segmental occlusion in the proximal right internal carotid artery. High-grade stenosis in the left internal carotid artery measuring greater than 70%. Difficult to assess the disease in the left internal carotid artery due to extensive shadowing plaque. Carotid arteries could be better characterized with MRA or CTA of the neck if needed. Patent vertebral arteries. These results will be called to the ordering clinician or representative by the Radiologist Assistant, and communication documented in the PACS or zVision Dashboard. Electronically Signed   By: Richarda Overlie M.D.   On: 01/20/2017 12:32     ASSESSMENT AND PLAN:   * Acute right PCA territory CVAs Secondary to atrial fibrillation Echocardiogram showed no intramural thrombus - aspirin and statin, Eliquis PT and OT work with patient. Will need skilled nursing facility. - Discussed with neurology. SNF at discharge  * Left carotid stenosis Will need medical management due to advanced age. Will refer to vascular  as OP regarding need for intervention  * HTN Continue home meds  * pAfib On eliquis  * Dysphagia due to CVA. On dysphagia 2  diet with nectar thickened liquids  All the records are reviewed and case discussed with Care Management/Social Worker Management plans discussed with the patient, family and they are in agreement.  CODE STATUS: DNR  DVT Prophylaxis: SCDs  TOTAL TIME TAKING CARE OF THIS PATIENT: 35 minutes.   POSSIBLE D/C IN 1-2 DAYS, DEPENDING ON CLINICAL CONDITION.  Milagros Loll R M.D on 01/21/2017 at 2:09 PM  Between 7am to 6pm - Pager - [phone]  After 6pm go to [email or web address] - password [email or web address] [facility]  [facility]  Office  [phone]  CC: Primary care physician; Dorothey Baseman, MD  Note: This dictation was prepared with Dragon dictation along with smaller phrase technology. Any transcriptional errors that result from this process are unintentional.

## 2017-01-21 NOTE — Progress Notes (Signed)
Occupational Therapy Treatment Patient Details Name: Keyona Emrich MRN: [id number] DOB: 1920-12-24 Today's Date: 01/21/2017    History of present illness 83 y.o.femalewith a known history of Atrial fibrillation on anticoagulation with eliquis, DM, CHF, dysrhythmia, HTN, PVD, and previous LLE AKA who presented to ED on 4/25 with slurry speech and L facial droop and feeling dizzy and leaning towards L side.    OT comments  Patient was seen this date for left sided awareness activities, self care tasks with grooming with min assistance.  Patient states, "That's not mine" when her left arm was in her field of vision, decreased proprioception in the left UE, decreased muscle control with attempts at movement and pain noted in left shoulder.  Patient also had difficulty with using right arm for self care tasks as well and required assistance to reach to comb hair. Will continue to work towards goals to maximize safety and independence in daily tasks.   Follow Up Recommendations  SNF    Equipment Recommendations       Recommendations for Other Services      Precautions / Restrictions Precautions Precautions: Fall Restrictions Weight Bearing Restrictions: Yes       Mobility Bed Mobility                  Transfers                      Balance                                           ADL either performed or assessed with clinical judgement   ADL Overall ADL's : Needs assistance/impaired     Grooming: Minimal assistance;Cueing for sequencing;Bed level Grooming Details (indicate cue type and reason): Patient demostrates difficulty with reaching to perform hair care, complains of right elbow pain, left shoulder pain and weakness in both arms.                                General ADL Comments: Patient seen for left sided neglect, cues for turning head to left, attending to left side.  Patient looked at her left arm with a puzzled  expression and states, "That's not mine" referrring to her left arm.       Vision   Tracking/Visual Pursuits: Impaired - to be further tested in functional context Additional Comments: Decreased attention to left side both visually and with proprioception, decreased ability to recognize left arm and control left arm movements.    Perception     Praxis      Cognition Arousal/Alertness: Awake/alert Behavior During Therapy: Restless Overall Cognitive Status: Difficult to assess                                 General Comments: Patient following simple commands today 1 step only.  Cues for attention to left side.         Exercises     Shoulder Instructions       General Comments      Pertinent Vitals/ Pain       Pain Assessment: 0-10 Pain Score: 4  Pain Location: right elbow and left shoulder Pain Descriptors / Indicators: Aching Pain Intervention(s): Limited activity within patient's tolerance;Repositioned;Monitored during session  Home Living                                          Prior Functioning/Environment              Frequency  Min 1X/week        Progress Toward Goals  OT Goals(current goals can now be found in the care plan section)  Progress towards OT goals: Progressing toward goals  Acute Rehab OT Goals Patient Stated Goal: go home and do as much for myself as I can OT Goal Formulation: With patient Time For Goal Achievement: 02/03/17 Potential to Achieve Goals: Good  Plan Discharge plan remains appropriate    Co-evaluation                 End of Session    OT Visit Diagnosis: Muscle weakness (generalized) (M62.81);Hemiplegia and hemiparesis;Other symptoms and signs involving cognitive function Hemiplegia - Right/Left: Left Hemiplegia - dominant/non-dominant: Non-Dominant Hemiplegia - caused by: Unspecified   Activity Tolerance Patient tolerated treatment well   Patient Left in bed;with call  bell/phone within reach;with bed alarm set   Nurse Communication          Time: 1610-9604 OT Time Calculation (min): 20 min  Charges: OT General Charges $OT Visit: 1 Procedure OT Treatments $Self Care/Home Management : 8-22 mins  Amy T Lovett, OTR/L, CLT    Lovett,Amy 01/21/2017, 2:23 PM

## 2017-01-21 NOTE — Progress Notes (Signed)
Speech Therapy Note: reviewed chart notes; pt continues to present w/ s/s of Dysarthria. Recommend f/u upon discharge for full assessment and tx of such. ST services will continue to follow pt for dysphagia tx while admitted. Pt is able to communicate verbally wants and needs; encourage pt to slow down and speak slowly during any verbal interaction.    Jerilynn Som, MS, CCC-SLP

## 2017-01-21 NOTE — Clinical Social Work Note (Signed)
CSW spoke with pt's daughter in law for bed choice. Pt's daughter in law chose [facility]. CSW updated facility and they are able to accept pt at discharge. Per MD, pt will discharge tomorrow (Saturday, 4/28), however MD will complete discharge summary. CSW will send discharge summary to facility when complete. CSW will continue to follow.   Dede Query, MSW, LCSW  Clinical Social Worker  [phone]

## 2017-01-22 LAB — GLUCOSE, CAPILLARY: GLUCOSE-CAPILLARY: 108 mg/dL — AB (ref 65–99)

## 2017-01-22 NOTE — Progress Notes (Signed)
Patient discharged to [facility] per MD order. Report called to Rosey Bath at facility. EMS called for transport.

## 2017-01-22 NOTE — Plan of Care (Signed)
Problem: Education: Goal: Knowledge of [facility] Education information/materials will improve Outcome: Progressing VSS, free of falls during shift.  Neuro checks stable, NIH 4.  Reports bilateral hand pain R>L, refused intervention.  No other complaints overnight.  Bed in low position, call bell within reach.  WCTM.

## 2017-01-22 NOTE — Clinical Social Work Note (Signed)
Patient will dc to [facility] today. The facility is aware. The CSW attempted to contact the patient's family member by phone with no answer; left voice mail. CSW will con't to follow to confirm EMS transport. Packet delivered to chart and documentation sent to facility.  Argentina Ponder, MSW, Theresia Majors [phone]

## 2017-02-10 ENCOUNTER — Encounter (HOSPITAL_COMMUNITY): Payer: Self-pay

## 2017-02-10 ENCOUNTER — Emergency Department (HOSPITAL_COMMUNITY): Payer: Medicare Other

## 2017-02-10 ENCOUNTER — Inpatient Hospital Stay (HOSPITAL_COMMUNITY)
Admission: EM | Admit: 2017-02-10 | Discharge: 2017-02-17 | DRG: 065 | Disposition: A | Discharge status: Skilled Nursing Facility | Payer: Medicare Other | Attending: Neurology | Admitting: Neurology

## 2017-02-10 DIAGNOSIS — I4891 Unspecified atrial fibrillation: Secondary | ICD-10-CM | POA: Diagnosis present

## 2017-02-10 DIAGNOSIS — I482 Chronic atrial fibrillation: Secondary | ICD-10-CM | POA: Diagnosis not present

## 2017-02-10 DIAGNOSIS — I615 Nontraumatic intracerebral hemorrhage, intraventricular: Secondary | ICD-10-CM | POA: Diagnosis not present

## 2017-02-10 DIAGNOSIS — I618 Other nontraumatic intracerebral hemorrhage: Principal | ICD-10-CM | POA: Diagnosis present

## 2017-02-10 DIAGNOSIS — Z87891 Personal history of nicotine dependence: Secondary | ICD-10-CM | POA: Diagnosis not present

## 2017-02-10 DIAGNOSIS — I619 Nontraumatic intracerebral hemorrhage, unspecified: Secondary | ICD-10-CM | POA: Diagnosis present

## 2017-02-10 DIAGNOSIS — M25412 Effusion, left shoulder: Secondary | ICD-10-CM

## 2017-02-10 DIAGNOSIS — M7989 Other specified soft tissue disorders: Secondary | ICD-10-CM | POA: Diagnosis not present

## 2017-02-10 DIAGNOSIS — I160 Hypertensive urgency: Secondary | ICD-10-CM | POA: Diagnosis not present

## 2017-02-10 DIAGNOSIS — G8190 Hemiplegia, unspecified affecting unspecified side: Secondary | ICD-10-CM | POA: Diagnosis present

## 2017-02-10 DIAGNOSIS — I6523 Occlusion and stenosis of bilateral carotid arteries: Secondary | ICD-10-CM | POA: Diagnosis present

## 2017-02-10 DIAGNOSIS — M109 Gout, unspecified: Secondary | ICD-10-CM | POA: Diagnosis present

## 2017-02-10 DIAGNOSIS — I161 Hypertensive emergency: Secondary | ICD-10-CM | POA: Diagnosis present

## 2017-02-10 DIAGNOSIS — Z79899 Other long term (current) drug therapy: Secondary | ICD-10-CM

## 2017-02-10 DIAGNOSIS — I509 Heart failure, unspecified: Secondary | ICD-10-CM | POA: Diagnosis present

## 2017-02-10 DIAGNOSIS — I5021 Acute systolic (congestive) heart failure: Secondary | ICD-10-CM | POA: Diagnosis not present

## 2017-02-10 DIAGNOSIS — I110 Hypertensive heart disease with heart failure: Secondary | ICD-10-CM | POA: Diagnosis present

## 2017-02-10 DIAGNOSIS — I639 Cerebral infarction, unspecified: Secondary | ICD-10-CM

## 2017-02-10 DIAGNOSIS — E1151 Type 2 diabetes mellitus with diabetic peripheral angiopathy without gangrene: Secondary | ICD-10-CM | POA: Diagnosis present

## 2017-02-10 DIAGNOSIS — I481 Persistent atrial fibrillation: Secondary | ICD-10-CM | POA: Diagnosis not present

## 2017-02-10 DIAGNOSIS — I10 Essential (primary) hypertension: Secondary | ICD-10-CM | POA: Diagnosis not present

## 2017-02-10 DIAGNOSIS — I169 Hypertensive crisis, unspecified: Secondary | ICD-10-CM | POA: Diagnosis not present

## 2017-02-10 DIAGNOSIS — I610 Nontraumatic intracerebral hemorrhage in hemisphere, subcortical: Secondary | ICD-10-CM | POA: Diagnosis not present

## 2017-02-10 DIAGNOSIS — Z8673 Personal history of transient ischemic attack (TIA), and cerebral infarction without residual deficits: Secondary | ICD-10-CM | POA: Diagnosis not present

## 2017-02-10 DIAGNOSIS — E785 Hyperlipidemia, unspecified: Secondary | ICD-10-CM | POA: Diagnosis present

## 2017-02-10 DIAGNOSIS — Z881 Allergy status to other antibiotic agents status: Secondary | ICD-10-CM

## 2017-02-10 DIAGNOSIS — R509 Fever, unspecified: Secondary | ICD-10-CM | POA: Diagnosis not present

## 2017-02-10 DIAGNOSIS — Z887 Allergy status to serum and vaccine status: Secondary | ICD-10-CM

## 2017-02-10 LAB — I-STAT CHEM 8, ED
BUN: 18 mg/dL (ref 6–20)
CHLORIDE: 107 mmol/L (ref 101–111)
Calcium, Ion: 1.12 mmol/L — ABNORMAL LOW (ref 1.15–1.40)
Creatinine, Ser: 0.6 mg/dL (ref 0.44–1.00)
Glucose, Bld: 159 mg/dL — ABNORMAL HIGH (ref 65–99)
HEMATOCRIT: 40 % (ref 36.0–46.0)
HEMOGLOBIN: 13.6 g/dL (ref 12.0–15.0)
Potassium: 3.6 mmol/L (ref 3.5–5.1)
Sodium: 143 mmol/L (ref 135–145)
TCO2: 26 mmol/L (ref 0–100)

## 2017-02-10 LAB — DIFFERENTIAL
Basophils Absolute: 0.1 10*3/uL (ref 0.0–0.1)
Basophils Relative: 1 %
EOS PCT: 2 %
Eosinophils Absolute: 0.2 10*3/uL (ref 0.0–0.7)
LYMPHS PCT: 27 %
Lymphs Abs: 2.3 10*3/uL (ref 0.7–4.0)
Monocytes Absolute: 0.8 10*3/uL (ref 0.1–1.0)
Monocytes Relative: 10 %
NEUTROS ABS: 5.1 10*3/uL (ref 1.7–7.7)
NEUTROS PCT: 60 %

## 2017-02-10 LAB — COMPREHENSIVE METABOLIC PANEL
ALBUMIN: 3.2 g/dL — AB (ref 3.5–5.0)
ALK PHOS: 73 U/L (ref 38–126)
ALT: 13 U/L — ABNORMAL LOW (ref 14–54)
ANION GAP: 9 (ref 5–15)
AST: 27 U/L (ref 15–41)
BUN: 16 mg/dL (ref 6–20)
CALCIUM: 9.2 mg/dL (ref 8.9–10.3)
CO2: 24 mmol/L (ref 22–32)
Chloride: 108 mmol/L (ref 101–111)
Creatinine, Ser: 0.7 mg/dL (ref 0.44–1.00)
GFR calc Af Amer: >60 mL/min (ref >60)
GFR calc non Af Amer: >60 mL/min (ref >60)
GLUCOSE: 155 mg/dL — AB (ref 65–99)
Potassium: 3.7 mmol/L (ref 3.5–5.1)
SODIUM: 141 mmol/L (ref 135–145)
Total Bilirubin: 0.6 mg/dL (ref 0.3–1.2)
Total Protein: 6.7 g/dL (ref 6.5–8.1)

## 2017-02-10 LAB — PROTIME-INR
INR: 1.18
PROTHROMBIN TIME: 15.1 s (ref 11.4–15.2)

## 2017-02-10 LAB — I-STAT TROPONIN, ED: Troponin i, poc: 0.01 ng/mL (ref 0.00–0.08)

## 2017-02-10 LAB — CBC
HCT: 40.6 % (ref 36.0–46.0)
Hemoglobin: 13.3 g/dL (ref 12.0–15.0)
MCH: 32.2 pg (ref 26.0–34.0)
MCHC: 32.8 g/dL (ref 30.0–36.0)
MCV: 98.3 fL (ref 78.0–100.0)
PLATELETS: 236 10*3/uL (ref 150–400)
RBC: 4.13 MIL/uL (ref 3.87–5.11)
RDW: 15.2 % (ref 11.5–15.5)
WBC: 8.4 10*3/uL (ref 4.0–10.5)

## 2017-02-10 LAB — CBG MONITORING, ED: GLUCOSE-CAPILLARY: 132 mg/dL — AB (ref 65–99)

## 2017-02-10 LAB — APTT: aPTT: 34 seconds (ref 24–36)

## 2017-02-10 MED ORDER — PROTHROMBIN COMPLEX CONC HUMAN 500 UNITS IV KIT
3551.0000 [IU] | PACK | Status: AC
  Administered 2017-02-10: 3551 [IU] via INTRAVENOUS
  Filled 2017-02-10: qty 142

## 2017-02-10 MED ORDER — PANTOPRAZOLE SODIUM 40 MG IV SOLR
40.0000 mg | Freq: Every day | INTRAVENOUS | Status: DC
  Administered 2017-02-11 – 2017-02-13 (×4): 40 mg via INTRAVENOUS
  Filled 2017-02-10 (×4): qty 40

## 2017-02-10 MED ORDER — SENNOSIDES-DOCUSATE SODIUM 8.6-50 MG PO TABS
1.0000 | ORAL_TABLET | Freq: Two times a day (BID) | ORAL | Status: DC
  Administered 2017-02-12 – 2017-02-17 (×8): 1 via ORAL
  Filled 2017-02-10 (×8): qty 1

## 2017-02-10 MED ORDER — NICARDIPINE HCL IN NACL 20-0.86 MG/200ML-% IV SOLN
3.0000 mg/h | Freq: Once | INTRAVENOUS | Status: AC
  Administered 2017-02-10: 5 mg/h via INTRAVENOUS

## 2017-02-10 MED ORDER — STROKE: EARLY STAGES OF RECOVERY BOOK
Freq: Once | Status: AC
  Administered 2017-02-11: 1
  Filled 2017-02-10: qty 1

## 2017-02-10 MED ORDER — NICARDIPINE HCL IN NACL 20-0.86 MG/200ML-% IV SOLN
3.0000 mg/h | Freq: Once | INTRAVENOUS | Status: DC
  Filled 2017-02-10: qty 200

## 2017-02-10 MED ORDER — ONDANSETRON HCL 4 MG/2ML IJ SOLN
INTRAMUSCULAR | Status: AC
  Administered 2017-02-10: 4 mg
  Filled 2017-02-10: qty 2

## 2017-02-10 MED ORDER — NICARDIPINE HCL IN NACL 20-0.86 MG/200ML-% IV SOLN
0.0000 mg/h | INTRAVENOUS | Status: DC
  Administered 2017-02-11: 2.5 mg/h via INTRAVENOUS
  Administered 2017-02-11: 10 mg/h via INTRAVENOUS
  Filled 2017-02-10 (×2): qty 200

## 2017-02-10 NOTE — ED Provider Notes (Signed)
[facility]PT Provider Note   CSN: [id number] Arrival date & time: 02/10/17  2116     History   Chief Complaint No chief complaint on file.   HPI [name] is a 83 y.o. female.  The history is provided by the patient, the EMS personnel and a relative. The history is limited by the condition of the patient.  Cerebrovascular Accident  This is a new problem. Episode onset: started around 8pm. The problem occurs constantly. The problem has not changed since onset.Pertinent negatives include no chest pain. Nothing aggravates the symptoms. Nothing relieves the symptoms. She has tried nothing for the symptoms. The treatment provided no relief.    Past Medical History:  Diagnosis Date  . Atrial fibrillation (HCC)   . CHF (congestive heart failure) (HCC)   . Diabetes mellitus without complication (HCC)   . Dysrhythmia   . Foot ulcer, right (HCC)   . Gangrene of toe (HCC)   . Gout   . Hypertension   . Peripheral vascular disease [facility])     Patient Active Problem List   Diagnosis Date Noted  . Acute CVA (cerebrovascular accident) (HCC) 01/19/2017  . Cellulitis 02/03/2016  . PVD (peripheral vascular disease) (HCC) 02/03/2016  . HTN (hypertension) 02/03/2016  . A-fib (HCC) 02/03/2016  . CHF (congestive heart failure) (HCC) 02/03/2016  . DM foot (HCC) 05/27/2015  . Ulcer of foot due to diabetes mellitus (HCC) 05/27/2015  . Diabetic foot ulcer (HCC) 05/27/2015    Past Surgical History:  Procedure Laterality Date  . EYE SURGERY    . JOINT REPLACEMENT    . LEG AMPUTATION THROUGH KNEE Left   . PERIPHERAL VASCULAR CATHETERIZATION Right 04/08/2015   Procedure: Lower Extremity Angiography;  Surgeon: Renford Dills, MD;  Location: [facility] INVASIVE CV LAB;  Service: Cardiovascular;  Laterality: Right;  . PERIPHERAL VASCULAR CATHETERIZATION  04/08/2015   Procedure: Lower Extremity Intervention;  Surgeon: Renford Dills, MD;  Location: [facility] INVASIVE CV LAB;  Service:  Cardiovascular;;  . PERIPHERAL VASCULAR CATHETERIZATION N/A 07/15/2015   Procedure: Abdominal Aortogram w/Lower Extremity;  Surgeon: Renford Dills, MD;  Location: [facility] INVASIVE CV LAB;  Service: Cardiovascular;  Laterality: N/A;  . PERIPHERAL VASCULAR CATHETERIZATION  07/15/2015   Procedure: Lower Extremity Intervention;  Surgeon: Renford Dills, MD;  Location: [facility] INVASIVE CV LAB;  Service: Cardiovascular;;  . PERIPHERAL VASCULAR CATHETERIZATION Right 02/09/2016   Procedure: Lower Extremity Angiography;  Surgeon: Annice Needy, MD;  Location: [facility] INVASIVE CV LAB;  Service: Cardiovascular;  Laterality: Right;  . PERIPHERAL VASCULAR CATHETERIZATION  02/09/2016   Procedure: Lower Extremity Intervention;  Surgeon: Annice Needy, MD;  Location: [facility] INVASIVE CV LAB;  Service: Cardiovascular;;  . Right Knee replacement      OB History    No data available       Home Medications    Prior to Admission medications   Medication Sig Start Date End Date Taking? Authorizing Provider  apixaban (ELIQUIS) 5 MG TABS tablet Take 5 mg by mouth 2 (two) times daily.     [provider]  atorvastatin (LIPITOR) 10 MG tablet Take 1 tablet (10 mg total) by mouth daily at 6 PM. 01/21/17   Milagros Loll, MD  Cholecalciferol (VITAMIN D3) 5000 units CAPS Take 1 capsule by mouth daily.    [provider]  clotrimazole-betamethasone (LOTRISONE) cream Apply 1 application topically daily.    [provider]  Copper Gluconate 2 MG TABS Take 2 mg by mouth 1 day or 1  dose.    [provider]  digoxin (LANOXIN) 0.125 MG tablet Take 0.0625 mg by mouth daily.     [provider]  docusate sodium (COLACE) 100 MG capsule Take 100 mg by mouth 2 (two) times daily as needed.     [provider]  feeding supplement, ENSURE ENLIVE, (ENSURE ENLIVE) LIQD Take 237 mLs by mouth 2 (two) times daily between meals. Patient not taking: Reported on 01/19/2017 02/13/16   Katha HammingKonidena,  Snehalatha, MD  ferrous sulfate 325 (65 FE) MG tablet Take 325 mg by mouth daily.     [provider]  furosemide (LASIX) 40 MG tablet Take 1 tablet (40 mg total) by mouth daily. 01/21/17   Milagros LollSudini, Srikar, MD  metoprolol (LOPRESSOR) 50 MG tablet Take 1 tablet (50 mg total) by mouth 2 (two) times daily. Patient taking differently: Take 100 mg by mouth 2 (two) times daily.  02/13/16   Katha HammingKonidena, Snehalatha, MD  nystatin (MYCOSTATIN) 100000 UNIT/ML suspension Use as directed 5 mLs (500,000 Units total) in the mouth or throat 4 (four) times daily. Patient not taking: Reported on 01/19/2017 02/13/16   Katha HammingKonidena, Snehalatha, MD  nystatin (NYSTATIN) powder Apply topically 3 (three) times daily. Patient not taking: Reported on 01/19/2017 02/13/16   Katha HammingKonidena, Snehalatha, MD  oxyCODONE (OXY IR/ROXICODONE) 5 MG immediate release tablet Take 1 tablet (5 mg total) by mouth every 6 (six) hours as needed for moderate pain or severe pain. Patient not taking: Reported on 01/19/2017 02/13/16   Altamese DillingVachhani, Vaibhavkumar, MD  potassium chloride (K-DUR) 10 MEQ tablet Take 10 mEq by mouth daily.    [provider]  pyridOXINE (VITAMIN B-6) 100 MG tablet Take 100 mg by mouth daily.    [provider]  selenium 50 MCG TABS tablet Take 200 mcg by mouth daily.    [provider]  vitamin B-12 (CYANOCOBALAMIN) 1000 MCG tablet Take 1,000 mcg by mouth daily.    [provider]  zinc gluconate 50 MG tablet Take 50 mg by mouth daily.    [provider]    Family History No family history on file.  Social History Social History  Substance Use Topics  . Smoking status: Former Smoker    Quit date: 12/02/1954  . Smokeless tobacco: Never Used  . Alcohol use No     Allergies   Amoxicillin; Cephalexin; Prednisolone; Prednisone; Toradol [ketorolac tromethamine]; and Tramadol   Review of Systems Review of Systems  Unable to perform ROS: Acuity of condition  Cardiovascular:  Negative for chest pain.     Physical Exam Updated Vital Signs There were no vitals taken for this visit. ED Triage Vitals  Enc Vitals Group     BP 02/10/17 2135 (!) 222/144     Pulse Rate 02/10/17 2135 (!) 127     Resp 02/10/17 2135 (!) 22     Temp 02/10/17 2135 98.1 F (36.7 C)     Temp Source 02/10/17 2135 Oral     SpO2 02/10/17 2135 97 %     Weight 02/10/17 2134 160 lb 0.9 oz (72.6 kg)     Height 02/10/17 2134 5\' 9"  (1.753 m)     Head Circumference --      Peak Flow --      Pain Score 02/10/17 2347 0     Pain Loc --      Pain Edu? --      Excl. in GC? --     Physical Exam  Constitutional: No distress.  Elderly-appearing  female  HENT:  Head: Normocephalic and atraumatic.  Marked left facial droop  Eyes: Conjunctivae are normal.  Neck: Neck supple.  Head tilted to the right  Cardiovascular: Normal heart sounds and intact distal pulses.   Irregularly irregular  Pulmonary/Chest: Effort normal and breath sounds normal. No respiratory distress.  Abdominal: Soft. There is no tenderness. There is no guarding.  Musculoskeletal: She exhibits no edema.  Left above-the-knee amputation  Neurological: She is alert. A cranial nerve deficit is present. GCS eye subscore is 4. GCS verbal subscore is 5. GCS motor subscore is 6.  Skin: Skin is warm and dry.  Psychiatric: She has a normal mood and affect.  Nursing note and vitals reviewed.    ED Treatments / Results  Labs (all labs ordered are listed, but only abnormal results are displayed) Labs Reviewed  CBG MONITORING, ED - Abnormal; Notable for the following:       Result Value   Glucose-Capillary 132 (*)    All other components within normal limits  PROTIME-INR  APTT  CBC  DIFFERENTIAL  COMPREHENSIVE METABOLIC PANEL  I-STAT TROPOININ, ED  I-STAT CHEM 8, ED    EKG  EKG Interpretation None       Radiology No results found.  Procedures Procedures (including critical care time)  Medications Ordered in  ED Medications - No data to display   Initial Impression / Assessment and Plan / ED Course  I have reviewed the triage vital signs and the nursing notes.  Pertinent labs & imaging results that were available during my care of the patient were reviewed by me and considered in my medical decision making (see chart for details).     Patient is a 83 year old female by past medical history as well as recent CVA on eliquis  who presents with sudden onset of left-sided facial droop as well as left-sided weakness. She is hypertensive and has an irregularly irregular rhythm and tachycardic. Head CT immediately obtained work shows intracranial hemorrhage. Neurology at bedside. Patient will be started on Cardene drip to control blood pressures to below 160 systolic and we will reverse eliquis. She is DO NOT RESUSCITATE and DO NOT INTUBATE. Patient will be admitted to the ICU for further management and evaluation  Final Clinical Impressions(s) / ED Diagnoses   Final diagnoses:  None    New Prescriptions New Prescriptions   No medications on file     Marijean Niemann, MD 02/11/17 1459    Eber Hong, MD 02/16/17 [phone]

## 2017-02-10 NOTE — Consult Note (Signed)
Admission H&P    Chief Complaint: Acute onset of left-sided weakness and slurred speech.  HPI: [name] is an 83 y.o. female with a history of hypertension, peripheral vascular disease, atrial fibrillation on anticoagulation, diabetes mellitus, CHF and recent right CVA with left-sided weakness, presenting with recurrent acute left side weakness with facial droop, slurred speech and left extremity weakness. Onset of symptoms was at 8:00 PM today. She has been taking Eliquis daily for anticoagulation. CT scan of the head showed right 3.4 x 3.1 cm thalamic hemorrhage with ventricular extension and mild mass effect with a 4 mm right to left midline shift. Blood pressure was markedly elevated at 222/144. Cardene drip IV was started for acute management of hypertension.  LSN: 8:00 PM on 02/10/2017 tPA Given: No: Acute ICH, recent stroke, on anticoagulation with Eliquis mRankin:  Past Medical History:  Diagnosis Date  . Atrial fibrillation ([place])   . CHF (congestive heart failure) ([place])   . Diabetes mellitus without complication ([place])   . Dysrhythmia   . Foot ulcer, right ([place])   . Gangrene of toe ([place])   . Gout   . Hypertension   . Peripheral vascular disease [facility])     Past Surgical History:  Procedure Laterality Date  . EYE SURGERY    . JOINT REPLACEMENT    . LEG AMPUTATION THROUGH KNEE Left   . PERIPHERAL VASCULAR CATHETERIZATION Right 04/08/2015   Procedure: Lower Extremity Angiography;  Surgeon: Katha Cabal, MD;  Location: [place] CV LAB;  Service: Cardiovascular;  Laterality: Right;  . PERIPHERAL VASCULAR CATHETERIZATION  04/08/2015   Procedure: Lower Extremity Intervention;  Surgeon: Katha Cabal, MD;  Location: [place] CV LAB;  Service: Cardiovascular;;  . PERIPHERAL VASCULAR CATHETERIZATION N/A 07/15/2015   Procedure: Abdominal Aortogram w/Lower Extremity;  Surgeon: Katha Cabal, MD;  Location: [place] CV LAB;  Service: Cardiovascular;   Laterality: N/A;  . PERIPHERAL VASCULAR CATHETERIZATION  07/15/2015   Procedure: Lower Extremity Intervention;  Surgeon: Katha Cabal, MD;  Location: [place] CV LAB;  Service: Cardiovascular;;  . PERIPHERAL VASCULAR CATHETERIZATION Right 02/09/2016   Procedure: Lower Extremity Angiography;  Surgeon: Algernon Huxley, MD;  Location: [place] CV LAB;  Service: Cardiovascular;  Laterality: Right;  . PERIPHERAL VASCULAR CATHETERIZATION  02/09/2016   Procedure: Lower Extremity Intervention;  Surgeon: Algernon Huxley, MD;  Location: [place] CV LAB;  Service: Cardiovascular;;  . Right Knee replacement      History reviewed. No pertinent family history. Social History:  reports that she quit smoking about 62 years ago. She has never used smokeless tobacco. She reports that she does not drink alcohol or use drugs.  Allergies:  Allergies  Allergen Reactions  . Amoxicillin   . Cephalexin     Other reaction(s): Unknown  . Prednisolone   . Prednisone   . Toradol [Ketorolac Tromethamine]   . Tramadol     Medications: Preadmission medications were reviewed by me.  ROS: Unavailable due to patient's lethargic state.  Physical Examination: Blood pressure (!) 222/144, pulse (!) 127, temperature 98.1 F (36.7 C), temperature source Oral, resp. rate (!) 22, height _0  (1.676 m), weight 72.6 kg (160 lb 0.9 oz), SpO2 97 %.  HEENT-  Normocephalic, no lesions, without obvious abnormality.  Normal external eye and conjunctiva.  Normal TM's bilaterally.  Normal auditory canals and external ears. Normal external nose, mucus membranes and septum.  Normal pharynx. Neck supple with no masses, nodes, nodules or enlargement. Cardiovascular - irregularly  irregular rhythm, S1, S2 normal and no S3 or S4 Lungs - chest clear, no wheezing, rales, normal symmetric air entry Abdomen - soft, non-tender; bowel sounds normal; no masses,  no organomegaly Extremities - left BK amputation; mild to moderate  edema of distal right lower extremity  Neurologic Examination: Mental Status: Lethargic, slurred speech and neglect of left side. Able to follow commands with use of right extremities. Cranial Nerves: Dense left homonymous hemianopsia. III/IV/VI-Pupils were equal and reacted. Extraocular movements were limited with conjugate gaze to the right sid.    VII-moderate left lower facial weakness facial weakness. VIII-normal. X-moderately severe dysarthria. Motor: Flaccid left hemiplegia noted; normal strength and tone of right extremities. Sensory: Neglect of left side. Deep Tendon Reflexes: 1+ and right knee. Plantars: Flexor on the right. Carotid auscultation: Normal  Results for orders placed or performed during the hospital encounter of 02/10/17 (from the past 48 hour(s))  Protime-INR     Status: None   Collection Time: 02/10/17  9:20 PM  Result Value Ref Range   Prothrombin Time 15.1 11.4 - 15.2 seconds   INR 1.18   APTT     Status: None   Collection Time: 02/10/17  9:20 PM  Result Value Ref Range   aPTT 34 24 - 36 seconds  CBC     Status: None   Collection Time: 02/10/17  9:20 PM  Result Value Ref Range   WBC 8.4 4.0 - 10.5 K/uL   RBC 4.13 3.87 - 5.11 MIL/uL   Hemoglobin 13.3 12.0 - 15.0 g/dL   HCT 40.6 36.0 - 46.0 %   MCV 98.3 78.0 - 100.0 fL   MCH 32.2 26.0 - 34.0 pg   MCHC 32.8 30.0 - 36.0 g/dL   RDW 15.2 11.5 - 15.5 %   Platelets 236 150 - 400 K/uL  Differential     Status: None   Collection Time: 02/10/17  9:20 PM  Result Value Ref Range   Neutrophils Relative % 60 %   Neutro Abs 5.1 1.7 - 7.7 K/uL   Lymphocytes Relative 27 %   Lymphs Abs 2.3 0.7 - 4.0 K/uL   Monocytes Relative 10 %   Monocytes Absolute 0.8 0.1 - 1.0 K/uL   Eosinophils Relative 2 %   Eosinophils Absolute 0.2 0.0 - 0.7 K/uL   Basophils Relative 1 %   Basophils Absolute 0.1 0.0 - 0.1 K/uL  Comprehensive metabolic panel     Status: Abnormal   Collection Time: 02/10/17  9:20 PM  Result Value Ref  Range   Sodium 141 135 - 145 mmol/L   Potassium 3.7 3.5 - 5.1 mmol/L   Chloride 108 101 - 111 mmol/L   CO2 24 22 - 32 mmol/L   Glucose, Bld 155 (H) 65 - 99 mg/dL   BUN 16 6 - 20 mg/dL   Creatinine, Ser 0.70 0.44 - 1.00 mg/dL   Calcium 9.2 8.9 - 10.3 mg/dL   Total Protein 6.7 6.5 - 8.1 g/dL   Albumin 3.2 (L) 3.5 - 5.0 g/dL   AST 27 15 - 41 U/L   ALT 13 (L) 14 - 54 U/L   Alkaline Phosphatase 73 38 - 126 U/L   Total Bilirubin 0.6 0.3 - 1.2 mg/dL   GFR calc non Af Amer >60 >60 mL/min   GFR calc Af Amer >60 >60 mL/min    Comment: (NOTE) The eGFR has been calculated using the CKD EPI equation. This calculation has not been validated in all clinical situations. eGFR's persistently <  60 mL/min signify possible Chronic Kidney Disease.    Anion gap 9 5 - 15  CBG monitoring, ED     Status: Abnormal   Collection Time: 02/10/17  9:20 PM  Result Value Ref Range   Glucose-Capillary 132 (H) 65 - 99 mg/dL  I-stat troponin, ED     Status: None   Collection Time: 02/10/17  9:26 PM  Result Value Ref Range   Troponin i, poc 0.01 0.00 - 0.08 ng/mL   Comment 3            Comment: Due to the release kinetics of cTnI, a negative result within the first hours of the onset of symptoms does not rule out myocardial infarction with certainty. If myocardial infarction is still suspected, repeat the test at appropriate intervals.   I-Stat Chem 8, ED     Status: Abnormal   Collection Time: 02/10/17  9:28 PM  Result Value Ref Range   Sodium 143 135 - 145 mmol/L   Potassium 3.6 3.5 - 5.1 mmol/L   Chloride 107 101 - 111 mmol/L   BUN 18 6 - 20 mg/dL   Creatinine, Ser 0.60 0.44 - 1.00 mg/dL   Glucose, Bld 159 (H) 65 - 99 mg/dL   Calcium, Ion 1.12 (L) 1.15 - 1.40 mmol/L   TCO2 26 0 - 100 mmol/L   Hemoglobin 13.6 12.0 - 15.0 g/dL   HCT 40.0 36.0 - 46.0 %   Ct Head Code Stroke W/o Cm  Result Date: 02/10/2017 CLINICAL DATA:  Code stroke. LEFT facial droop, LEFT-sided weakness. History of atrial  fibrillation, hypertension, diabetes. EXAM: CT HEAD WITHOUT CONTRAST TECHNIQUE: Contiguous axial images were obtained from the base of the skull through the vertex without intravenous contrast. COMPARISON:  CT and MRI of the head January 21, 2007 FINDINGS: Mild motion degraded examination. BRAIN: 3.4 x 3.1 cm RIGHT thalamic intraparenchymal hematoma with intraventricular extension. Blood products distending the RIGHT occipital horn. Small amount of blood products LEFT frontal horn. 4 mm RIGHT to LEFT midline shift. No ventricular entrapment. No acute large vascular territory infarct. Patchy white matter hypodensities unchanged compatible with moderate chronic small vessel ischemic disease. No abnormal extra-axial fluid collections. 7 mm rounded density unchanged, within the RIGHT hemispheric fissure (axial 15/35). VASCULAR: Moderate to severe calcific atherosclerosis of the carotid siphons. SKULL: No skull fracture. Stable vascular [place] LEFT parietal calvarium. No significant scalp soft tissue swelling. SINUSES/ORBITS: The mastoid air-cells and included paranasal sinuses are well-aerated.The included ocular globes and orbital contents are non-suspicious. OTHER: Patient is edentulous. 1 cm nodular density RIGHT parotid gland likely represents vein. ASPECTS [facility] Stroke Program Early CT Score) - Ganglionic level infarction (caudate, lentiform nuclei, internal capsule, insula, M1-M3 cortex): 7 - Supraganglionic infarction (M4-M6 cortex): 3 Total score (0-10 with 10 being normal): 10 IMPRESSION: 1. Acute 3.4 x 3.1 cm RIGHT thalamic hemorrhage with intraventricular extension, RIGHT occipital horn entrapment. 4 mm RIGHT to LEFT midline shift. 2. 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 3. ASPECTS is 10. Critical Value/emergent results were called by telephone at the time of interpretation on 02/10/2017 at 9:44 pm to Dr.  Nicole Kindred, Neurology , who verbally acknowledged these results. Electronically Signed   By: Elon Alas M.D.   On: 02/10/2017 21:50    Assessment: 83 y.o. female with multiple risk factors for stroke including atrial fibrillation on anticoagulation, presenting with acute right thalamic hemorrhage with ventricular extension and mild  mass effect, as well as hypertensive urgency.  Stroke Risk Factors - atrial fibrillation, diabetes mellitus and hypertension  Plan: 1. HgbA1c, fasting lipid panel 2. MRI, MRA  of the brain without contrast 3. PT consult, OT consult, Speech consult 4. Echocardiogram 5. Carotid dopplers 6. Prophylactic therapy-None 7. Risk factor modification 8. Telemetry monitoring  This patient is critically ill and at significant risk of neurological worsening or death, and care requires constant monitoring of vital signs, hemodynamics,respiratory and cardiac monitoring, neurological assessment, discussion with family, other specialists and medical decision making of high complexity. Total critical care time was 60 minutes.  C.R. Nicole Kindred, MD [facility] [phone]  02/10/2017, 9:56 PM

## 2017-02-10 NOTE — ED Triage Notes (Signed)
Pt was at home when family noticed a change in her speech was slurred,L facial droop, left sided weakness, left sided poll. PT has a previous stroke resulting of dysphagia and limited mobility has hx a fib. Pt is on eliquis. Has a LBKA.

## 2017-02-10 NOTE — Progress Notes (Signed)
   02/10/17 2100  Clinical Encounter Type  Visited With Patient and family together  Visit Type Initial;Spiritual support;Code  Referral From Nurse  Consult/Referral To Chaplain  Spiritual Encounters  Spiritual Needs Emotional  Stress Factors  Patient Stress Factors None identified  Family Stress Factors Lack of knowledge   Escorted family to trauma A. Provided ministry of hospitality and emotional support. [employer]

## 2017-02-10 NOTE — ED Provider Notes (Signed)
The patient is a 83 year old female who is had a recent ischemic stroke due to a posterior artery, was placed on anticoagulants, had some improvement of the left sided upper extremity weakness and her slurred speech however this evening approximately 8:00 the patient had a headache which was acute in onset followed by severe dense hemiparesis on the left. She was brought by ambulance emergently to the stroke center. On exam the patient has a very dense left-sided hemiparesis. She is unable to speak very well because of her difficulty speaking and facial droop. She is hardly able to squeeze with her left hand. She is able to follow commands on the right. She is alert. Cardiac exam shows no acute abnormalities, abdomen is soft, conjunctiva are clear, oropharynx is clear.  The patient is having an acute hemorrhagic stroke based on CT scan findings. She is severely hypertensive. We will try to reverse anticoagulants in conjunction with neurology discussion, we will also use Cardene as an IV drip to help control the severe hypertension. The patient is critically ill, we'll need to go to the intensive care unit.   EKG Interpretation  Date/Time:  Thursday Feb 10 2017 21:35:07 EDT Ventricular Rate:  136 PR Interval:    QRS Duration: 71 QT Interval:  259 QTC Calculation: 378 R Axis:   90 Text Interpretation:  Atrial fibrillation Borderline right axis deviation Borderline low voltage, extremity leads Anteroseptal infarct, old Repolarization abnormality, prob rate related Since last tracing rate faster Confirmed by Eber HongMiller, Haelyn Forgey ([id number]) on 02/10/2017 11:18:43 PM Also confirmed by Eber HongMiller, Monta Maiorana ([id number]), editor Elita QuickWatlington, [place] ([id number])  on 02/11/2017 7:06:55 AM        CRITICAL CARE Performed by: Vida RollerBrian D Ricca Melgarejo Total critical care time: 35 minutes Critical care time was exclusive of separately billable procedures and treating other patients. Critical care was necessary to treat or prevent imminent or  life-threatening deterioration. Critical care was time spent personally by me on the following activities: development of treatment plan with patient and/or surrogate as well as nursing, discussions with consultants, evaluation of patient's response to treatment, examination of patient, obtaining history from patient or surrogate, ordering and performing treatments and interventions, ordering and review of laboratory studies, ordering and review of radiographic studies, pulse oximetry and re-evaluation of patient's condition.  I saw and evaluated the patient, reviewed the resident's note and I agree with the findings and plan.   I personally interpreted the EKG as well as the resident and agree with the interpretation on the resident's chart.  Final diagnoses:  Right-sided nontraumatic intracerebral hemorrhage, unspecified cerebral location [facility](HCC)  Hypertensive crisis      Eber HongMiller, Ellesse Antenucci, MD 02/16/17 [phone]

## 2017-02-10 NOTE — ED Notes (Signed)
Clothing, watch, necklace given to pt daughter. Unable to remove rings on L hand d/t swelling

## 2017-02-10 NOTE — Progress Notes (Addendum)
Mrs. [name] is a 83 yo female from home where she lives with son and daughter in law. Per family pt up to bathroom at 2000 when family found pt to have Left sided weakness, leaning to Left, slurred speech, Left side facial droop, headache and Left eye pain. Pt had a previous stroke January 19, 2017 resulting in dysphasia and limited mobility. Pt returned home today at 1600 from Rehab from previous stroke. She has a hx stroke, A-Fib, HTN, PVD. Stroke cancelled due to acute hemorrhagic stroke.  LKW 2000, CBG, 132, NIH 22

## 2017-02-11 ENCOUNTER — Inpatient Hospital Stay (HOSPITAL_COMMUNITY): Payer: Medicare Other

## 2017-02-11 DIAGNOSIS — I4891 Unspecified atrial fibrillation: Secondary | ICD-10-CM

## 2017-02-11 DIAGNOSIS — I6523 Occlusion and stenosis of bilateral carotid arteries: Secondary | ICD-10-CM

## 2017-02-11 DIAGNOSIS — E785 Hyperlipidemia, unspecified: Secondary | ICD-10-CM

## 2017-02-11 DIAGNOSIS — I610 Nontraumatic intracerebral hemorrhage in hemisphere, subcortical: Secondary | ICD-10-CM

## 2017-02-11 DIAGNOSIS — I481 Persistent atrial fibrillation: Secondary | ICD-10-CM

## 2017-02-11 DIAGNOSIS — Z8673 Personal history of transient ischemic attack (TIA), and cerebral infarction without residual deficits: Secondary | ICD-10-CM

## 2017-02-11 LAB — GLUCOSE, CAPILLARY: GLUCOSE-CAPILLARY: 121 mg/dL — AB (ref 65–99)

## 2017-02-11 LAB — MRSA PCR SCREENING: MRSA BY PCR: NEGATIVE

## 2017-02-11 MED ORDER — METOPROLOL TARTRATE 5 MG/5ML IV SOLN
5.0000 mg | Freq: Once | INTRAVENOUS | Status: AC
  Administered 2017-02-11: 5 mg via INTRAVENOUS

## 2017-02-11 MED ORDER — SODIUM CHLORIDE 0.9 % IV SOLN
INTRAVENOUS | Status: DC
  Administered 2017-02-11 (×2): via INTRAVENOUS

## 2017-02-11 MED ORDER — CLEVIDIPINE BUTYRATE 0.5 MG/ML IV EMUL
0.0000 mg/h | INTRAVENOUS | Status: DC
  Administered 2017-02-11: 2 mg/h via INTRAVENOUS
  Administered 2017-02-12 (×2): 3 mg/h via INTRAVENOUS
  Administered 2017-02-12: 2 mg/h via INTRAVENOUS
  Filled 2017-02-11 (×5): qty 50

## 2017-02-11 MED ORDER — METOPROLOL TARTRATE 5 MG/5ML IV SOLN
INTRAVENOUS | Status: AC
  Filled 2017-02-11: qty 5

## 2017-02-11 MED ORDER — CHLORHEXIDINE GLUCONATE 0.12 % MT SOLN
15.0000 mL | Freq: Two times a day (BID) | OROMUCOSAL | Status: DC
  Administered 2017-02-11 – 2017-02-17 (×13): 15 mL via OROMUCOSAL
  Filled 2017-02-11 (×9): qty 15

## 2017-02-11 MED ORDER — ORAL CARE MOUTH RINSE
15.0000 mL | Freq: Two times a day (BID) | OROMUCOSAL | Status: DC
  Administered 2017-02-11 – 2017-02-17 (×14): 15 mL via OROMUCOSAL

## 2017-02-11 MED ORDER — INSULIN ASPART 100 UNIT/ML ~~LOC~~ SOLN
0.0000 [IU] | Freq: Three times a day (TID) | SUBCUTANEOUS | Status: DC
  Administered 2017-02-12: 2 [IU] via SUBCUTANEOUS
  Administered 2017-02-12 – 2017-02-13 (×4): 1 [IU] via SUBCUTANEOUS
  Administered 2017-02-14 – 2017-02-15 (×3): 2 [IU] via SUBCUTANEOUS
  Administered 2017-02-16: 1 [IU] via SUBCUTANEOUS
  Administered 2017-02-16: 3 [IU] via SUBCUTANEOUS
  Administered 2017-02-16 – 2017-02-17 (×2): 1 [IU] via SUBCUTANEOUS
  Administered 2017-02-17: 2 [IU] via SUBCUTANEOUS

## 2017-02-11 MED ORDER — IOPAMIDOL (ISOVUE-370) INJECTION 76%
INTRAVENOUS | Status: AC
  Administered 2017-02-11: 50 mL via INTRAVENOUS
  Filled 2017-02-11: qty 50

## 2017-02-11 MED ORDER — METOPROLOL TARTRATE 5 MG/5ML IV SOLN
5.0000 mg | INTRAVENOUS | Status: DC | PRN
  Administered 2017-02-11 – 2017-02-13 (×6): 5 mg via INTRAVENOUS
  Filled 2017-02-11 (×7): qty 5

## 2017-02-11 NOTE — Evaluation (Addendum)
Clinical/Bedside Swallow Evaluation Patient Details  Name: [name] MRN: [id number] Date of Birth: 07-14-1921  Today's Date: 02/11/2017 Time: SLP Start Time (ACUTE ONLY): 0914 SLP Stop Time (ACUTE ONLY): 0924 SLP Time Calculation (min) (ACUTE ONLY): 10 min  Past Medical History:  Past Medical History:  Diagnosis Date  . Atrial fibrillation (HCC)   . CHF (congestive heart failure) (HCC)   . Diabetes mellitus without complication (HCC)   . Dysrhythmia   . Foot ulcer, right (HCC)   . Gangrene of toe (HCC)   . Gout   . Hypertension   . Peripheral vascular disease [facility])    Past Surgical History:  Past Surgical History:  Procedure Laterality Date  . EYE SURGERY    . JOINT REPLACEMENT    . LEG AMPUTATION THROUGH KNEE Left   . PERIPHERAL VASCULAR CATHETERIZATION Right 04/08/2015   Procedure: Lower Extremity Angiography;  Surgeon: Renford Dills, MD;  Location: [facility] INVASIVE CV LAB;  Service: Cardiovascular;  Laterality: Right;  . PERIPHERAL VASCULAR CATHETERIZATION  04/08/2015   Procedure: Lower Extremity Intervention;  Surgeon: Renford Dills, MD;  Location: [facility] INVASIVE CV [facility];  Service: Cardiovascular;;  . PERIPHERAL VASCULAR CATHETERIZATION N/A 07/15/2015   Procedure: Abdominal Aortogram w/Lower Extremity;  Surgeon: Renford Dills, MD;  Location: [facility] INVASIVE CV [facility];  Service: Cardiovascular;  Laterality: N/A;  . PERIPHERAL VASCULAR CATHETERIZATION  07/15/2015   Procedure: Lower Extremity Intervention;  Surgeon: Renford Dills, MD;  Location: [facility] INV[facility] CV [facility];  Service: Cardiovascular;;  . PERIPHERAL VASCULAR CATHETERIZATION Right 02/09/2016   Procedure: Lower Extremity Angiography;  Surgeon: Annice Needy, MD;  Location: [facility] INVASIVE CV [facility];  Service: Cardiovascular;  Laterality: Right;  . PERIPHERAL VASCULAR CATHETERIZATION  02/09/2016   Procedure: Lower Extremity Intervention;  Surgeon: Annice Needy, MD;  Location: [facility] INVASIVE CV LAB;  Service: Cardiovascular;;   . Right Knee replacement     HPI:  Pt is a 83 yo female who presented with recurrent left sided weakness (pt was admitted in April 2018 for stroke). CT Head showed an acute 3.4 x 3.1 cm RIGHT thalamic hemorrhage with intraventricular extension, RIGHT occipital horn entrapment. 4 mmRIGHT to LEFT midline shift. BSE 01/20/17 showed mild left-sided weakness and cough x1 with thin liquids, recommending Dys 2 diet and nectar thick liquids. PMH includes: recent CVA, HTN, gout, L BKA, DM, CHF, afib    Assessment / Plan / Recommendation Clinical Impression  Pt has moderate left-sided weakness, which seems to be worsened compared to description of minimal weakness in swallow evaluation last month. Although she does not have anterior spillage, I am concerned for premature spillage into the pharynx given her mildly prolonged oral transit, subtle increase in WOB, and coughing post-swallow. Coughing is primarily observed with pureed solids, which given her impaired sensation could be indicative of delayed and/or silent aspiration of thin liquids. Recommend that pt remain NPO for today with SLP f/u on next date to assess for improvements versus likely need for MBS. SLP Visit Diagnosis: Dysphagia, unspecified (R13.10)    Aspiration Risk  Moderate aspiration risk    Diet Recommendation NPO   Medication Administration: Via alternative means    Other  Recommendations Oral Care Recommendations: Oral care QID   Follow up Recommendations Skilled Nursing facility      Frequency and Duration min 2x/week  2 weeks       Prognosis Prognosis for Safe Diet Advancement: Fair Barriers to Reach Goals: Severity of deficits;Other (Comment) (?PLOF - MBS not completed in  April)      Swallow Study   General HPI: Pt is a 83 yo female who presented with recurrent left sided weakness (pt was admitted in April 2018 for stroke). CT Head showed an acute 3.4 x 3.1 cm RIGHT thalamic hemorrhage with Type of Study: Bedside  Swallow Evaluation Previous Swallow Assessment: BSE 01/20/17 recommending Dys 2 diet, nectar thick liquids Diet Prior to this Study: NPO Temperature Spikes Noted: No Respiratory Status: Room air History of Recent Intubation: No Behavior/Cognition: Alert;Cooperative;Requires cueing Oral Cavity Assessment: Within Functional Limits Oral Cavity - Dentition: Dentures, not available;Edentulous Self-Feeding Abilities: Total assist Patient Positioning: Upright in bed (poor head control) Baseline Vocal Quality: Normal Volitional Cough: Weak Volitional Swallow: Able to elicit    Oral/Motor/Sensory Function Overall Oral Motor/Sensory Function: Moderate impairment Facial ROM: Reduced left;Suspected CN VII (facial) dysfunction Facial Symmetry: Abnormal symmetry left;Suspected CN VII (facial) dysfunction Facial Strength: Reduced left;Suspected CN VII (facial) dysfunction Facial Sensation: Reduced left;Suspected CN V (Trigeminal) dysfunction Lingual ROM: Within Functional Limits Lingual Symmetry: Within Functional Limits Lingual Strength: Within Functional Limits Velum:  (difficult to visualize but appears symmetrical) Mandible: Within Functional Limits   Ice Chips Ice chips: Within functional limits Presentation: Spoon   Thin Liquid Thin Liquid: Impaired Presentation: Cup;Spoon Oral Phase Functional Implications: Prolonged oral transit Pharyngeal  Phase Impairments: Cough - Delayed;Change in Vital Signs;Multiple swallows;Suspected delayed Swallow    Nectar Thick Nectar Thick Liquid: Not tested   Honey Thick Honey Thick Liquid: Not tested   Puree Puree: Impaired Presentation: Spoon Pharyngeal Phase Impairments: Cough - Immediate;Cough - Delayed   Solid   GO   Solid: Not tested        [name], Lizbeth Feijoo 02/11/2017,10:03 AM  [name], M.A. CCC-SLP [phone]

## 2017-02-11 NOTE — Progress Notes (Signed)
PT Cancellation Note  Patient Details Name: [name] NationsHelmi [name] MRN: [id number] DOB: 09/26/1921   Cancelled Treatment:    Reason Eval/Treat Not Completed: Patient not medically ready  Will hold until tomorrow per MD. Will follow up.   [name] DivineShauna A Selmer [name] 02/11/2017, 10:52 AM [name] Pratyush [name], PT, DPT [phone]

## 2017-02-11 NOTE — Progress Notes (Signed)
PT Cancellation Note  Patient Details Name: [name] NationsHelmi [name] MRN: [id number] DOB: 05/22/1921   Cancelled Treatment:    Reason Eval/Treat Not Completed: Patient not medically ready Pt on bedrest. Will await increase in activity orders prior to PT evaluation.   Blake DivineShauna A Jayni Prescher 02/11/2017, 9:21 AM [name] Tiawanna Luchsinger, PT, DPT [phone]

## 2017-02-11 NOTE — Progress Notes (Signed)
OT Cancellation Note  Patient Details Name: [name] NationsHelmi Carraway MRN: [id number] DOB: 08/26/1921   Cancelled Treatment:    Reason Eval/Treat Not Completed: Medical issues which prohibited therapy.  Will reattmpt.  Quintavia Rogstad King Salmononarpe, OTR/L [phone]   Jeani HawkingConarpe, Labrenda Lasky M 02/11/2017, 1:39 PM

## 2017-02-11 NOTE — Progress Notes (Signed)
STROKE TEAM PROGRESS NOTE   HISTORY OF PRESENT ILLNESS (per record) [name] is an 83 y.o. female with a history of hypertension, peripheral vascular disease, atrial fibrillation on anticoagulation, diabetes mellitus, CHF and recent right CVA with left-sided weakness, presenting with recurrent acute left side weakness with facial droop, slurred speech and left extremity weakness. Onset of symptoms was at 8:00 PM today 02/10/2017. She has been taking Eliquis daily for anticoagulation. CT scan of the head showed right 3.4 x 3.1 cm thalamic hemorrhage with ventricular extension and mild mass effect with a 4 mm right to left midline shift. Blood pressure was markedly elevated at 222/144. Cardene drip IV was started for acute management of hypertension. She was admitted to the neuro ICU for further evaluation and treatment.   SUBJECTIVE (INTERVAL HISTORY) RN at bedside, no family at bedside. Pt lethargic and sleepy but easily arousable. Orientated to self, age and place, not to time. Able to follow all simple commands. Left hemiplegia and left facial droop with right gaze preference. Still in afib with RVR. On cardizem drip and metoprolol PRN.    OBJECTIVE Temp:  [98.1 F (36.7 C)-98.9 F (37.2 C)] 98.2 F (36.8 C) (05/18 0800) Pulse Rate:  [37-130] 89 (05/18 0800) Cardiac Rhythm: Atrial fibrillation (05/18 0800) Resp:  [11-32] 20 (05/18 0800) BP: (79-222)/(45-144) 128/63 (05/18 0800) SpO2:  [82 %-97 %] 95 % (05/18 0800) FiO2 (%):  [0 %] 0 % (05/17 2342) Weight:  [72.6 kg (160 lb 0.9 oz)] 72.6 kg (160 lb 0.9 oz) (05/17 2136)  CBC:  Recent Labs Lab 02/10/17 2120 02/10/17 2128  WBC 8.4  --   NEUTROABS 5.1  --   HGB 13.3 13.6  HCT 40.6 40.0  MCV 98.3  --   PLT 236  --     Basic Metabolic Panel:  Recent Labs Lab 02/10/17 2120 02/10/17 2128  NA 141 143  K 3.7 3.6  CL 108 107  CO2 24  --   GLUCOSE 155* 159*  BUN 16 18  CREATININE 0.70 0.60  CALCIUM 9.2  --     Lipid  Panel:    Component Value Date/Time   CHOL 197 01/20/2017 0416   TRIG 66 01/20/2017 0416   HDL 40 (L) 01/20/2017 0416   CHOLHDL 4.9 01/20/2017 0416   VLDL 13 01/20/2017 0416   LDLCALC 144 (H) 01/20/2017 0416   HgbA1c:  Lab Results  Component Value Date   HGBA1C 6.1 (H) 01/20/2017   Urine Drug Screen:    Component Value Date/Time   LABOPIA NONE DETECTED 01/19/2017 1431   COCAINSCRNUR NONE DETECTED 01/19/2017 1431   LABBENZ NONE DETECTED 01/19/2017 1431   AMPHETMU NONE DETECTED 01/19/2017 1431   THCU NONE DETECTED 01/19/2017 1431   LABBARB NONE DETECTED 01/19/2017 1431    Alcohol Level     Component Value Date/Time   ETH <5 01/19/2017 1646    IMAGING I have personally reviewed the radiological images below and agree with the radiology interpretations.  Ct Head Code Stroke W/o Cm 02/10/2017 1. Acute 3.4 x 3.1 cm RIGHT thalamic hemorrhage with intraventricular extension, RIGHT occipital horn entrapment. 4 mm RIGHT to LEFT midline shift. 2. 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 3. ASPECTS is 10.   CTA head and neck pending  MRI 01/20/17 -  1. Small acute right PCA infarcts in the right thalamus, medial right temporal lobe, and right occipital lobe. 2. Incomplete  motion degraded study. Only T1 and diffusion sequences were acquired before the patient terminated exam.  CUS 01/20/17 -  Severe atherosclerotic disease involving bilateral carotid arteries. No flow identified in the proximal and mid right internal carotid artery but difficult to evaluate due to shadowing plaque. Findings are concerning for a critical stenosis or segmental occlusion in the proximal right internal carotid artery. High-grade stenosis in the left internal carotid artery measuring greater than 70%. Difficult to assess the disease in the left internal carotid artery due to extensive shadowing plaque.  Carotid arteries could be better characterized with MRA or CTA of the neck if needed. Patent vertebral arteries.  TTE - 01/19/17 - Left ventricle: The cavity size was normal. There was mild   concentric hypertrophy. Systolic function was normal. The   estimated ejection fraction was in the range of 55% to 60%. Wall   motion was normal; there were no regional wall motion   abnormalities. The study was not technically sufficient to allow   evaluation of LV diastolic dysfunction due to atrial   fibrillation. - Aortic valve: There was moderate stenosis. There was trivial   regurgitation. Mean gradient (S): 15 mm Hg. Valve area (VTI):   1.39 cm^2. - Mitral valve: Calcified annulus. Mildly thickened leaflets .   There was moderate regurgitation. - Left atrium: The atrium was severely dilated. - Right atrium: The atrium was moderately dilated. - Pulmonic valve: There was moderate regurgitation. - Pulmonary arteries: Systolic pressure was moderately increased.   PA peak pressure: 50 mm Hg (S).   PHYSICAL EXAM  Temp:  [98.1 F (36.7 C)-99.6 F (37.6 C)] 99.6 F (37.6 C) (05/18 1120) Pulse Rate:  [37-130] 108 (05/18 1100) Resp:  [11-32] 23 (05/18 1100) BP: (79-222)/(45-144) 148/71 (05/18 1100) SpO2:  [82 %-98 %] 98 % (05/18 1100) FiO2 (%):  [0 %] 0 % (05/17 2342) Weight:  [160 lb 0.9 oz (72.6 kg)] 160 lb 0.9 oz (72.6 kg) (05/17 2136)  General - Well nourished, well developed, lethargic and sleepy.  Ophthalmologic - Fundi not visualized due to noncooperation.  Cardiovascular - irregularly irregular heart rate and rhythm with tachycardia.  Neuro - lethargic and sleepy but easily arousable. Following simple commands, both central and peripheral commands, orientated to self, age and place but not to time. Severe dysarthria. PERRL, eyes right gaze preference, left neglect, barely cross midline, not blinking to visual threat on the left. Left facial droop. Left UE 0/5, LLE BKA but 2/5  proximal on pain stimulation. RUE spontaneous movement against gravity, RLE strong withdraw to pain. RUE intact FTN. Sensation not cooperative and gait not tested.   ASSESSMENT/PLAN [name] is a 83 y.o. female with history of hypertension, peripheral vascular disease, atrial fibrillation on anticoagulation, diabetes mellitus, CHF and recent right CVA with left-sided weakness, presenting with recurrent left side weakness with facial droop, slurred speech in setting of extreme hypertension. CT showed a R thalamic hemorrhage.   Stroke:   R thalamic hemorrhage with IVH secondary to hypertensive emergency and on anticoagulation s/p Kcentral  Resultant  Lethargy, right gaze, left neglect, left hemiplegia  Code Stroke CT R thalamic hmg w/ IVH,  R occipital horn entrapment w/ 4mm R to L shift. 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 No source of embolus   LDL  144  HgbA1c 6.1  SCDs for VTE prophylaxis  Diet NPO time specified. Was on D2 diet PTA from stroke in April  Eliquis (apixaban) daily prior to admission, now on no antithrombotics  Ongoing aggressive stroke risk factor management  Therapy recommendations:  pending   Disposition:  pending  (lives w/ son and DIL)  Hypertensive Emergency  BP 222/144 on arrival  Started on cardene for BP control, now off  SBP goal < 160 due to b/l ICA stenosis  Avoid hypotensive given critical stenosis  Atrial Fibrillation  Home anticoagulation:  Eliquis (apixaban) daily   Not an AC d/t hmg  Bilateral carotid stenosis  01/20/17 CUS right ICA occlusion and left ICA 70% stenosis  CTA head and neck pending  avoid hypotension  BP goal 130-160  Not a candidate for intervention at this time  Hx stroke/TIA  12/2016 - R PCA scattered infarcts with  atrial fibrillation while on eliquis. Found to have B ICA stenosis, R ICA critical stenosis/occlusion, L ICA >70%. EF 55-60% - continued on eliquis  Hyperlipidemia  Home meds:  lipitor 10  LDL 144, goal < 70  Plan to continue statin at discharge  Diabetes type II  HgbA1c 6.1, at goal < 7.0  Controlled  SSI  CBG monitoring  Other Stroke Risk Factors  Advanced age  Former Cigarette smoker  hx CHF per record - EF 55-60%  PVD  Hospital day # 1  This patient is critically ill due to right ICH with IVH, b/l carotid stenosis, recent stroke, advanced age and at significant risk of neurological worsening, death form recurrent bleeding, ischemic stroke, seizure, heart failure. This patient's care requires constant monitoring of vital signs, hemodynamics, respiratory and cardiac monitoring, review of multiple databases, neurological assessment, discussion with family, other specialists and medical decision making of high complexity. I spent 40 minutes of neurocritical care time in the care of this patient.  Marvel PlanJindong Tacora Athanas, MD PhD Stroke Neurology 02/11/2017 12:55 PM   To contact Stroke Continuity provider, please refer to [email or web address]. After hours, contact General Neurology

## 2017-02-11 NOTE — Care Management Note (Addendum)
Case Management Note  Patient Details  Name: [name] MRN: [id number] Date of Birth: 05/17/1921  Subjective/Objective:     Pt admitted on 02/10/17 s/p Rt thalamic hemorrhage with IVH.  PTA, [facility] previous recent [facility].                 Action/Plan: Will follow progress. Plan return to SNF upon medical stability; will consult CSW.    Expected Discharge Date:                  Expected Discharge Plan: Clinical Social Worker In-House Referral:     Discharge planning Services  CM Consult  Post Acute Care Choice:    Choice offered to:     DME Arranged:    DME Agency:     HH Arranged:    HH Agency:     Status of Service:  In process, will continue to follow  If discussed at Long Length of Stay Meetings, dates discussed:    Additional Comments:  Quintella BatonJulie W. Tremon Sainvil, RN, BSN  Trauma/Neuro ICU Case Manager [phone]

## 2017-02-11 NOTE — Evaluation (Addendum)
Speech Language Pathology Evaluation Patient Details Name: [name] MRN: [id number] DOB: 02/28/1921 Today's Date: 02/11/2017 Time: [id number] SLP Time Calculation (min) (ACUTE ONLY): 18 min  Problem List:  Patient Active Problem List   Diagnosis Date Noted  . ICH (intracerebral hemorrhage) (HCC) 02/10/2017  . Acute CVA (cerebrovascular accident) (HCC) 01/19/2017  . Cellulitis 02/03/2016  . PVD (peripheral vascular disease) (HCC) 02/03/2016  . HTN (hypertension) 02/03/2016  . A-fib (HCC) 02/03/2016  . CHF (congestive heart failure) (HCC) 02/03/2016  . DM foot (HCC) 05/27/2015  . Ulcer of foot due to diabetes mellitus (HCC) 05/27/2015  . Diabetic foot ulcer (HCC) 05/27/2015   Past Medical History:  Past Medical History:  Diagnosis Date  . Atrial fibrillation (HCC)   . CHF (congestive heart failure) (HCC)   . Diabetes mellitus without complication (HCC)   . Dysrhythmia   . Foot ulcer, right (HCC)   . Gangrene of toe (HCC)   . Gout   . Hypertension   . Peripheral vascular disease [facility](HCC)    Past Surgical History:  Past Surgical History:  Procedure Laterality Date  . EYE SURGERY    . JOINT REPLACEMENT    . LEG AMPUTATION THROUGH KNEE Left   . PERIPHERAL VASCULAR CATHETERIZATION Right 04/08/2015   Procedure: Lower Extremity Angiography;  Surgeon: Renford DillsGregory G Schnier, MD;  Location: [facility] INVASIVE CV LAB;  Service: Cardiovascular;  Laterality: Right;  . PERIPHERAL VASCULAR CATHETERIZATION  04/08/2015   Procedure: Lower Extremity Intervention;  Surgeon: Renford DillsGregory G Schnier, MD;  Location: [facility] INVASIVE CV LAB;  Service: Cardiovascular;;  . PERIPHERAL VASCULAR CATHETERIZATION N/A 07/15/2015   Procedure: Abdominal Aortogram w/Lower Extremity;  Surgeon: Renford DillsGregory G Schnier, MD;  Location: [facility] INVASIVE CV LAB;  Service: Cardiovascular;  Laterality: N/A;  . PERIPHERAL VASCULAR CATHETERIZATION  07/15/2015   Procedure: Lower Extremity Intervention;  Surgeon: Renford DillsGregory G Schnier, MD;  Location:  [facility] INVASIVE CV LAB;  Service: Cardiovascular;;  . PERIPHERAL VASCULAR CATHETERIZATION Right 02/09/2016   Procedure: Lower Extremity Angiography;  Surgeon: Annice NeedyJason S Dew, MD;  Location: [facility] INVASIVE CV LAB;  Service: Cardiovascular;  Laterality: Right;  . PERIPHERAL VASCULAR CATHETERIZATION  02/09/2016   Procedure: Lower Extremity Intervention;  Surgeon: Annice NeedyJason S Dew, MD;  Location: [facility] INVASIVE CV LAB;  Service: Cardiovascular;;  . Right Knee replacement     HPI:  Pt is a 83 yo female who presented with recurrent left sided weakness (pt was admitted in April 2018 for stroke). CT Head showed an acute 3.4 x 3.1 cm RIGHT thalamic hemorrhage with intraventricular extension, RIGHT occipital horn entrapment. 4 mmRIGHT to LEFT midline shift. BSE 01/20/17 showed mild left-sided weakness and cough x1 with thin liquids, recommending Dys 2 diet and nectar thick liquids. PMH includes: recent CVA, HTN, gout, L BKA, DM, CHF, afib   Assessment / Plan / Recommendation Clinical Impression  Pt has moderate left-sided weakness and sensory changes that impacts her intelligibility of speech at the phrase level, which appears to be an acute exacerbation of her prior dysarthria. Cognitively she is oriented to person only and requires Mod cues for basic problem solving, recall of new information, and intellectual awareness of deficits. She does not turn her head away from the right, and she does not appear to see me on her left even when her eyes went to the left x1. She will benefit from SLP f/u to maximize functional communication and safety.    SLP Assessment  SLP Recommendation/Assessment: Patient needs continued Speech Lanaguage Pathology Services SLP Visit Diagnosis: Dysarthria and  anarthria (R47.1);Cognitive communication deficit (R41.841)    Follow Up Recommendations  Skilled Nursing facility    Frequency and Duration min 2x/week  2 weeks      SLP Evaluation Cognition  Overall Cognitive Status: No  family/caregiver present to determine baseline cognitive functioning Arousal/Alertness: Awake/alert Orientation Level: Oriented to person;Disoriented to place;Disoriented to time;Disoriented to situation Attention: Sustained Sustained Attention: Appears intact Memory: Impaired Memory Impairment: Decreased recall of new information Awareness: Impaired Awareness Impairment: Intellectual impairment;Emergent impairment;Anticipatory impairment Problem Solving: Impaired Problem Solving Impairment: Verbal basic Safety/Judgment: Impaired       Comprehension  Auditory Comprehension Overall Auditory Comprehension: Appears within functional limits for tasks assessed    Expression Expression Primary Mode of Expression: Verbal Verbal Expression Overall Verbal Expression: Appears within functional limits for tasks assessed   Oral / Motor  Oral Motor/Sensory Function Overall Oral Motor/Sensory Function: Moderate impairment Facial ROM: Reduced left;Suspected CN VII (facial) dysfunction Facial Symmetry: Abnormal symmetry left;Suspected CN VII (facial) dysfunction Facial Strength: Reduced left;Suspected CN VII (facial) dysfunction Facial Sensation: Reduced left;Suspected CN V (Trigeminal) dysfunction Lingual ROM: Within Functional Limits Lingual Symmetry: Within Functional Limits Lingual Strength: Within Functional Limits Velum:  (difficult to visualize but appears symmetrical) Mandible: Within Functional Limits Motor Speech Overall Motor Speech: Impaired Respiration: Within functional limits Phonation: Low vocal intensity Resonance: Within functional limits Articulation: Impaired Level of Impairment: Phrase Intelligibility: Intelligibility reduced Phrase: 50-74% accurate Motor Planning: Witnin functional limits Motor Speech Errors: Not applicable Interfering Components: Premorbid status (dentures not available)   GO                    Maxcine Ham 02/11/2017, 10:11 AM  Maxcine Ham, M.A. CCC-SLP [phone]

## 2017-02-12 ENCOUNTER — Inpatient Hospital Stay (HOSPITAL_COMMUNITY): Payer: Medicare Other

## 2017-02-12 DIAGNOSIS — I482 Chronic atrial fibrillation: Secondary | ICD-10-CM

## 2017-02-12 DIAGNOSIS — I10 Essential (primary) hypertension: Secondary | ICD-10-CM

## 2017-02-12 LAB — GLUCOSE, CAPILLARY
GLUCOSE-CAPILLARY: 100 mg/dL — AB (ref 65–99)
GLUCOSE-CAPILLARY: 154 mg/dL — AB (ref 65–99)
Glucose-Capillary: 126 mg/dL — ABNORMAL HIGH (ref 65–99)
Glucose-Capillary: 197 mg/dL — ABNORMAL HIGH (ref 65–99)

## 2017-02-12 MED ORDER — ATORVASTATIN CALCIUM 10 MG PO TABS
10.0000 mg | ORAL_TABLET | Freq: Every day | ORAL | Status: DC
  Administered 2017-02-12 – 2017-02-16 (×5): 10 mg via ORAL
  Filled 2017-02-12 (×5): qty 1

## 2017-02-12 MED ORDER — RESOURCE THICKENUP CLEAR PO POWD
ORAL | Status: DC | PRN
  Filled 2017-02-12: qty 125

## 2017-02-12 MED ORDER — FERROUS SULFATE 325 (65 FE) MG PO TABS
325.0000 mg | ORAL_TABLET | Freq: Every day | ORAL | Status: DC
  Administered 2017-02-12 – 2017-02-17 (×6): 325 mg via ORAL
  Filled 2017-02-12 (×6): qty 1

## 2017-02-12 MED ORDER — VITAMIN B-12 1000 MCG PO TABS
1000.0000 ug | ORAL_TABLET | Freq: Every day | ORAL | Status: DC
  Administered 2017-02-12 – 2017-02-17 (×6): 1000 ug via ORAL
  Filled 2017-02-12 (×6): qty 1

## 2017-02-12 MED ORDER — VITAMIN D 1000 UNITS PO TABS
5000.0000 [IU] | ORAL_TABLET | Freq: Every day | ORAL | Status: DC
  Administered 2017-02-12 – 2017-02-17 (×6): 5000 [IU] via ORAL
  Filled 2017-02-12 (×6): qty 5

## 2017-02-12 MED ORDER — POTASSIUM CHLORIDE CRYS ER 10 MEQ PO TBCR
10.0000 meq | EXTENDED_RELEASE_TABLET | Freq: Every day | ORAL | Status: DC
  Administered 2017-02-12 – 2017-02-13 (×2): 10 meq via ORAL
  Filled 2017-02-12 (×2): qty 1

## 2017-02-12 MED ORDER — ZINC SULFATE 220 (50 ZN) MG PO CAPS
220.0000 mg | ORAL_CAPSULE | Freq: Every day | ORAL | Status: DC
  Administered 2017-02-12 – 2017-02-17 (×6): 220 mg via ORAL
  Filled 2017-02-12 (×7): qty 1

## 2017-02-12 MED ORDER — DIGOXIN 125 MCG PO TABS
0.1250 mg | ORAL_TABLET | Freq: Every day | ORAL | Status: DC
Start: 2017-02-12 — End: 2017-02-17
  Administered 2017-02-12 – 2017-02-17 (×6): 0.125 mg via ORAL
  Filled 2017-02-12 (×6): qty 1

## 2017-02-12 MED ORDER — ZINC GLUCONATE 50 MG PO TABS: 50.0000 mg | ORAL_TABLET | Freq: Every day | ORAL | Status: DC

## 2017-02-12 MED ORDER — FUROSEMIDE 40 MG PO TABS
40.0000 mg | ORAL_TABLET | Freq: Every day | ORAL | Status: DC
  Administered 2017-02-12 – 2017-02-13 (×2): 40 mg via ORAL
  Filled 2017-02-12 (×2): qty 1

## 2017-02-12 MED ORDER — VITAMIN B-6 100 MG PO TABS
100.0000 mg | ORAL_TABLET | Freq: Every day | ORAL | Status: DC
Start: 2017-02-12 — End: 2017-02-17
  Administered 2017-02-12 – 2017-02-17 (×6): 100 mg via ORAL
  Filled 2017-02-12 (×6): qty 1

## 2017-02-12 MED ORDER — METOPROLOL TARTRATE 50 MG PO TABS
50.0000 mg | ORAL_TABLET | Freq: Two times a day (BID) | ORAL | Status: DC
  Administered 2017-02-12: 50 mg via ORAL
  Filled 2017-02-12 (×3): qty 1

## 2017-02-12 MED ORDER — SELENIUM 50 MCG PO TABS
200.0000 ug | ORAL_TABLET | Freq: Every day | ORAL | Status: DC
  Administered 2017-02-12 – 2017-02-17 (×6): 200 ug via ORAL
  Filled 2017-02-12 (×6): qty 4

## 2017-02-12 NOTE — Progress Notes (Signed)
  PT Cancellation Note  Patient Details Name: [name] NationsHelmi [name] MRN: [id number] DOB: 06/15/1921   Cancelled Treatment:    Reason Eval/Treat Not Completed: Medical issues which prohibited therapy (pt remains on bedrest and await increased activity order)   [name] 02/12/2017, 7:12 AM  [name], PT [phone]

## 2017-02-12 NOTE — Progress Notes (Signed)
STROKE TEAM PROGRESS NOTE   HISTORY OF PRESENT ILLNESS (per record) [name] is an 83 y.o. female with a history of hypertension, peripheral vascular disease, atrial fibrillation on anticoagulation, diabetes mellitus, CHF and recent right CVA with left-sided weakness, presenting with recurrent acute left side weakness with facial droop, slurred speech and left extremity weakness. Onset of symptoms was at 8:00 PM today 02/10/2017. She has been taking Eliquis daily for anticoagulation. CT scan of the head showed right 3.4 x 3.1 cm thalamic hemorrhage with ventricular extension and mild mass effect with a 4 mm right to left midline shift. Blood pressure was markedly elevated at 222/144. Cardene drip IV was started for acute management of hypertension. She was admitted to the neuro ICU for further evaluation and treatment.   SUBJECTIVE (INTERVAL HISTORY) RN at bedside, no family at bedside. Pt much awake alert today. Orientated to self, age and place, not to time. Able to follow all simple commands. Still has left hemiplegia and left facial droop with right gaze preference. Still in afib with RVR. Passed swallow, will switch from cleviprex to po meds.    OBJECTIVE Temp:  [97.3 F (36.3 C)-99.3 F (37.4 C)] 97.9 F (36.6 C) (05/19 1132) Pulse Rate:  [42-135] 75 (05/19 1500) Cardiac Rhythm: Atrial fibrillation (05/19 0745) Resp:  [14-33] 26 (05/19 1500) BP: (103-173)/(42-108) 155/95 (05/19 1500) SpO2:  [90 %-100 %] 99 % (05/19 1500)  CBC:   Recent Labs Lab 02/10/17 2120 02/10/17 2128  WBC 8.4  --   NEUTROABS 5.1  --   HGB 13.3 13.6  HCT 40.6 40.0  MCV 98.3  --   PLT 236  --     Basic Metabolic Panel:   Recent Labs Lab 02/10/17 2120 02/10/17 2128  NA 141 143  K 3.7 3.6  CL 108 107  CO2 24  --   GLUCOSE 155* 159*  BUN 16 18  CREATININE 0.70 0.60  CALCIUM 9.2  --     Lipid Panel:     Component Value Date/Time   CHOL 197 01/20/2017 0416   TRIG 66 01/20/2017 0416   HDL 40 (L) 01/20/2017 0416   CHOLHDL 4.9 01/20/2017 0416   VLDL 13 01/20/2017 0416   LDLCALC 144 (H) 01/20/2017 0416   HgbA1c:  Lab Results  Component Value Date   HGBA1C 6.1 (H) 01/20/2017   Urine Drug Screen:     Component Value Date/Time   LABOPIA NONE DETECTED 01/19/2017 1431   COCAINSCRNUR NONE DETECTED 01/19/2017 1431   LABBENZ NONE DETECTED 01/19/2017 1431   AMPHETMU NONE DETECTED 01/19/2017 1431   THCU NONE DETECTED 01/19/2017 1431   LABBARB NONE DETECTED 01/19/2017 1431    Alcohol Level     Component Value Date/Time   ETH <5 01/19/2017 1646    IMAGING I have personally reviewed the radiological images below and agree with the radiology interpretations.  Ct Head Code Stroke W/o Cm 02/10/2017 1. Acute 3.4 x 3.1 cm RIGHT thalamic hemorrhage with intraventricular extension, RIGHT occipital horn entrapment. 4 mm RIGHT to LEFT midline shift. 2. 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 3. ASPECTS is 10.   CTA Head and Neck 02/11/2017 No enlargement of the right thalamic intraparenchymal hematoma. Ventricular size remains stable. Aortic atherosclerosis without aneurysm or dissection. 30% stenosis of the left common carotid artery origin.  40% stenosis of the left subclavian artery origin. Severe atherosclerotic disease in the right ICA bulb.  Serial stenoses 80% or greater. Severe atherosclerotic disease in the left ICA bulb. Stenosis as severe as 80%. Circumferential calcification in the carotid siphon regions with stenosis estimated at 50% bilaterally. 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 Severe right vertebral artery origin stenosis, 80% or greater. Estimated 50% stenosis of the left vertebral artery origin.  Focal stenoses along the cervical course of both vertebral arteries up to 50%.  Stenoses of the vertebral  arteries at the foramen magnum level, 70% on the right and 50% on the left. No basilar stenosis. No sign of vascular lesion to explain the right the right thalamic hemorrhage.  MRI  01/20/17  1. Small acute right PCA infarcts in the right thalamus, medial right temporal lobe, and right occipital lobe. 2. Incomplete motion degraded study. Only T1 and diffusion sequences were acquired before the patient terminated exam.  CUS  01/20/17  Severe atherosclerotic disease involving bilateral carotid arteries. No flow identified in the proximal and mid right internal carotid artery but difficult to evaluate due to shadowing plaque. Findings are concerning for a critical stenosis or segmental occlusion in the proximal right internal carotid artery. High-grade stenosis in the left internal carotid artery measuring greater than 70%. Difficult to assess the disease in the left internal carotid artery due to extensive shadowing plaque. Carotid arteries could be better characterized with MRA or CTA of the neck if needed. Patent vertebral arteries.  TTE  01/19/17 - Left ventricle: The cavity size was normal. There was mild   concentric hypertrophy. Systolic function was normal. The   estimated ejection fraction was in the range of 55% to 60%. Wall   motion was normal; there were no regional wall motion   abnormalities. The study was not technically sufficient to allow   evaluation of LV diastolic dysfunction due to atrial   fibrillation. - Aortic valve: There was moderate stenosis. There was trivial   regurgitation. Mean gradient (S): 15 mm Hg. Valve area (VTI):   1.39 cm^2. - Mitral valve: Calcified annulus. Mildly thickened leaflets .   There was moderate regurgitation. - Left atrium: The atrium was severely dilated. - Right atrium: The atrium was moderately dilated. - Pulmonic valve: There was moderate regurgitation. - Pulmonary arteries: Systolic pressure was moderately increased.   PA  peak pressure: 50 mm Hg (S).   PHYSICAL EXAM  Temp:  [97.3 F (36.3 C)-99.3 F (37.4 C)] 97.9 F (36.6 C) (05/19 1132) Pulse Rate:  [42-135] 75 (05/19 1500) Resp:  [14-33] 26 (05/19 1500) BP: (103-173)/(42-108) 155/95 (05/19 1500) SpO2:  [90 %-100 %] 99 % (05/19 1500)  General - Well nourished, well developed, lethargic.  Ophthalmologic - Fundi not visualized due to noncooperation.  Cardiovascular - irregularly irregular heart rate and rhythm with intermittent tachycardia.  Neuro - lethargic but awake alert. Following simple commands, both central and peripheral commands, orientated to self, age and place but not to time. Moderate dysarthria. PERRL, eyes right gaze preference, left neglect, not cross midline, not blinking to visual threat on the left. Left facial droop. Left UE 0/5, LLE BKA but 2+/5 proximal on pain stimulation. RUE spontaneous movement against gravity, and withdraw to pain. RUE intact FTN. Sensation not cooperative and gait not tested.   ASSESSMENT/PLAN Ms. Antonina Deziel is a 83 y.o. female with history of hypertension, peripheral vascular disease, atrial fibrillation on anticoagulation, diabetes mellitus, CHF and recent right CVA with left-sided weakness, presenting with recurrent left side weakness with facial droop, slurred  speech in setting of extreme hypertension.  CT showed a R thalamic hemorrhage.   Stroke:   R thalamic hemorrhage with IVH secondary to hypertensive emergency and on anticoagulation s/p [facility]  Resultant  Lethargy, right gaze, left neglect, left hemiplegia  Code Stroke CT R thalamic hmg w/ IVH,  R occipital horn entrapment w/ 4mm R to L shift. 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 6 mm right ACA aneurysm.  Carotid Doppler  12/2016 B ICA stenosis, R ICA critical stenosis/occlusion, L ICA >70%  2D  Echo 12/2016 EF 55-60%. No source of embolus   LDL 144  HgbA1c 6.1  SCDs for VTE prophylaxis  DIET DYS 2 Room service appropriate? Yes with Assist; Fluid consistency: Nectar Thick.  Eliquis (apixaban) daily prior to admission, now on no antithrombotics.  Ongoing aggressive stroke risk factor management  Therapy recommendations:  pending   Disposition:  pending  (lives w/ son and DIL)  Hypertensive Emergency  BP 222/144 on arrival  Started on cardene for BP control - switched to cleviprex for volume control  Resume home meds - metoprolol, lasix  Off cleviprex as able  SBP goal < 160 due to b/l ICA stenosis  Avoid hypotensive given critical stenosis  Atrial Fibrillation  Home anticoagulation:  Eliquis (apixaban) daily   Not an AC d/t hmg  Bilateral carotid stenosis  01/20/17 CUS right ICA occlusion and left ICA 70% stenosis  CTA head and neck - b/l ICA proximal 80% stenosis  Avoid hypotension  BP goal 130-160  Not a candidate for intervention at this time  Hx stroke/TIA  12/2016 - R PCA scattered infarcts with atrial fibrillation while on eliquis. Found to have B ICA stenosis, R ICA critical stenosis/occlusion, L ICA >70%. EF 55-60% - continued on eliquis  Hyperlipidemia  Home meds:  lipitor 10  LDL 144, goal < 70  Resumed lipitor this admission  Plan to continue statin at discharge  Diabetes type II  HgbA1c 6.1, at goal < 7.0  Controlled  SSI  CBG monitoring  Other Stroke Risk Factors  Advanced age  Former Cigarette smoker  hx CHF per record - EF 55-60%  PVD  Hospital day # 2  This patient is critically ill due to right ICH with IVH, b/l carotid stenosis, recent stroke, advanced age and at significant risk of neurological worsening, death form recurrent bleeding, ischemic stroke, seizure, heart failure. This patient's care requires constant monitoring of vital signs, hemodynamics, respiratory and cardiac monitoring, review of multiple  databases, neurological assessment, discussion with family, other specialists and medical decision making of high complexity. I spent 40 minutes of neurocritical care time in the care of this patient.  Marvel PlanJindong Karsen Nakanishi, MD PhD Stroke Neurology 02/12/2017 3:41 PM    To contact Stroke Continuity provider, please refer to [email or web address]. After hours, contact General Neurology

## 2017-02-12 NOTE — Evaluation (Signed)
Physical Therapy Evaluation Patient Details Name: [name] MRN: [id number] DOB: [birth year] Today's Date: 02/12/2017   History of Present Illness  83 y.o.femalewith a known history of Atrial fibrillation on anticoagulation with eliquis, DM, CHF, dysrhythmia, HTN, PVD, and previous LLE BKA who presented to ED on 4/25 with Right CVA, D/C to SNF and returned home 5/17 with increased left weakness same day and return to ER with right thalamic hemorrhage  Clinical Impression  Pt pleasant throughout session with maintained right gaze preference but able to turn head to the left and cross midline with cues. Pt with no AROM of LUE at this time and apparent non-purposeful hip flexion of LLE. Pt with poor balance requiring assist to sit EOB and unable to progress further with transfers today. Pt incontinent on arrival with nursing assist for pericare during mobility. No family present to state function since CVA 4/25 and pt an unreliable historian. Pt with decreased strength, balance, transfers, and cognition who will benefit from acute therapy to maximize mobility, balance, activity tolerance and transfers to decrease burden of care.   Pt with HR 135 with activity with BP 169/98 sitting EOB sats 88-98% on RA throughout    Follow Up Recommendations SNF;Supervision/Assistance - 24 hour    Equipment Recommendations  None recommended by PT    Recommendations for Other Services       Precautions / Restrictions Precautions Precautions: Fall Precaution Comments: L BKA, left hemiplegia, right gaze preference Restrictions Weight Bearing Restrictions: Yes LLE Weight Bearing: Non weight bearing      Mobility  Bed Mobility Overal bed mobility: Needs Assistance Bed Mobility: Rolling;Sidelying to Sit;Sit to Supine Rolling: Mod assist Sidelying to sit: Max assist Supine to sit: Max assist     General bed mobility comments: cues for sequence with assist to rotate pelvis and trunk to roll, assist  to bring leg off of bed and elevate trunk to sit. Assist to bring legs onto surface and control descent  Transfers                 General transfer comment: unable due to poor insight, impaired balance and strength  Ambulation/Gait                Stairs            Wheelchair Mobility    Modified Rankin (Stroke Patients Only)       Balance Overall balance assessment: Needs assistance   Sitting balance-Leahy Scale: Zero Sitting balance - Comments: pt with flexed trunk with significant left lean. pt unable to correct with assist and verbal cues with mod-max assist for midline posture grossly 3 min EOB Postural control: Left lateral lean                                   Pertinent Vitals/Pain Pain Assessment: Faces Pain Score: 5  Faces Pain Scale: No hurt Pain Location: right knee with touch or movement Pain Descriptors / Indicators: Aching Pain Intervention(s): Limited activity within patient's tolerance;Monitored during session    Home Living Family/patient expects to be discharged to:: Private residence Living Arrangements: Children Available Help at Discharge: Family Type of Home: House Home Access: Ramped entrance     Home Layout: One level Home Equipment: Wheelchair - manual;Bedside commode;Other (comment);Hospital bed Additional Comments: home setup from chart. Pt lives with Son and daughter in law    Prior Function  Comments: [facility] Pt states she returne to that level after SNF but she is confused and no family present to confirm PLOF or home setup     Hand Dominance        Extremity/Trunk Assessment   Upper Extremity Assessment RUE Deficits / Details: grossly 2/5 LUE Deficits / Details: no active rOM    Lower Extremity Assessment Lower Extremity Assessment: RLE deficits/detail;LLE deficits/detail RLE Deficits / Details: grossly 2/5 LLE Deficits /  Details: pt with non-volitional hip flexion and knee flexion, does not move on command    Cervical / Trunk Assessment Cervical / Trunk Assessment: Kyphotic  Communication   Communication: HOH  Cognition Arousal/Alertness: Awake/alert Behavior During Therapy: WFL for tasks assessed/performed Overall Cognitive Status: No family/caregiver present to determine baseline cognitive functioning Area of Impairment: Orientation;Memory;Following commands;Safety/judgement                 Orientation Level: Disoriented to;Time;Place   Memory: Decreased short-term memory Following Commands: Follows one step commands inconsistently Safety/Judgement: Decreased awareness of deficits;Decreased awareness of safety     General Comments: pt stating it is january, monday [facility]      General Comments      Exercises     Assessment/Plan    PT Assessment Patient needs continued PT services  PT Problem List Decreased strength;Decreased mobility;Decreased safety awareness;Decreased activity tolerance;Decreased cognition;Cardiopulmonary status limiting activity;Decreased balance;Decreased knowledge of use of DME;Decreased coordination       PT Treatment Interventions DME instruction;Gait training;Functional mobility training;Therapeutic activities;Therapeutic exercise;Balance training;Wheelchair mobility training;Patient/family education;Neuromuscular re-education;Cognitive remediation    PT Goals (Current goals can be found in the Care Plan section)  Acute Rehab PT Goals Patient Stated Goal: be able to get up PT Goal Formulation: With patient Time For Goal Achievement: 02/26/17 Potential to Achieve Goals: Fair    Frequency Min 3X/week   Barriers to discharge Decreased caregiver support      Co-evaluation               AM-PAC PT "6 Clicks" Daily Activity  Outcome Measure Difficulty turning over in bed (including adjusting bedclothes, sheets and  blankets)?: Total Difficulty moving from lying on back to sitting on the side of the bed? : Total Difficulty sitting down on and standing up from a chair with arms (e.g., wheelchair, bedside commode, etc,.)?: Total Help needed moving to and from a bed to chair (including a wheelchair)?: Total Help needed walking in hospital room?: Total Help needed climbing 3-5 steps with a railing? : Total 6 Click Score: 6    End of Session   Activity Tolerance: Patient tolerated treatment well Patient left: in bed;with bed alarm set;with call bell/phone within reach;with nursing/sitter in room Nurse Communication: Mobility status;Need for lift equipment PT Visit Diagnosis: Muscle weakness (generalized) (M62.81);Other abnormalities of gait and mobility (R26.89);Hemiplegia and hemiparesis Hemiplegia - Right/Left: Left Hemiplegia - dominant/non-dominant: Non-dominant Hemiplegia - caused by: Nontraumatic intracerebral hemorrhage    Time: 1003-1025 PT Time Calculation (min) (ACUTE ONLY): 22 min   Charges:   PT Evaluation $PT Eval Moderate Complexity: 1 Procedure     PT G Codes:        Delaney MeigsMaija Tabor Jorell Agne, PT [phone]   Rogue Pautler B Quame Spratlin 02/12/2017, 11:28 AM

## 2017-02-12 NOTE — Progress Notes (Signed)
OT Cancellation Note  Patient Details Name: [name] MRN: [id number] DOB: 08/23/1921   Cancelled Treatment:    Reason Eval/Treat Not Completed: Medical issues which prohibited therapy (active bedrest orders).  Gaye AlkenBailey A Rida Loudin M.S., OTR/L Pager: [phone]  02/12/2017, 8:08 AM

## 2017-02-12 NOTE — Progress Notes (Signed)
Modified Barium Swallow Progress Note  Patient Details  Name: [name] MRN: [id number] Date of Birth: 06/12/1921  Today's Date: 02/12/2017  Modified Barium Swallow completed.  Full report located under Chart Review in the Imaging Section.  Brief recommendations include the following:  Clinical Impression  Pt with a moderate oral, mild pharyngeal dysphagia which is sensorimotor in nature. Swallow is characterized by delayed oral transit and premature spill of thin liquids resulting in silent penetration before swallow to the level of the vocal folds with suspected aspiration, although this was difficult to confirm due to shoulder impeding view. With nectar thick liquids, premature spill was to the level of the vallecula and no penetration/ aspiration noted with the exception of one trial during which pt sneezed while liquid was in oral cavity and pt then aspirated small amount of liquid. Across consistencies pt had anterior spillage on L side and mild residuals under tongue, some of which were cleared when verbally cued. Pt is at an increased risk of aspiration given these findings and decreased cognitive status. Recommend initiating dysphagia 2 diet/ nectar thick liquids (straws ok), meds crushed in puree. Full supervision to assist with feeding, cue for small bites/ sips, minimize distractions, check for pocketing on L side and under tongue. Will continue to follow for diet tolerance/ consider advancement.   Swallow Evaluation Recommendations       SLP Diet Recommendations: Dysphagia 2 (Fine chop) solids;Nectar thick liquid   Liquid Administration via: Cup;Straw   Medication Administration: Crushed with puree   Supervision: Staff to assist with self feeding;Full supervision/cueing for compensatory strategies   Compensations: Minimize environmental distractions;Slow rate;Small sips/bites;Lingual sweep for clearance of pocketing;Monitor for anterior loss   Postural Changes: Seated  upright at 90 degrees   Oral Care Recommendations: Oral care BID   Other Recommendations: Order thickener from pharmacy;Have oral suction available    Amy Cecille AverK Oleksi[place], MA, CCC-SLP 02/12/2017,1:49 PM

## 2017-02-12 NOTE — Progress Notes (Signed)
  Speech Language Pathology Treatment: Dysphagia  Patient Details Name: Nolon NationsHelmi Erkkila MRN: [id number] DOB: 07/10/1921 Today's Date: 02/12/2017 Time: [id number] SLP Time Calculation (min) (ACUTE ONLY): 15 min  Assessment / Plan / Recommendation Clinical Impression  Dysphagia treatment provided for PO trials. Pt showed immediate and delayed s/s of aspiration (cough) following ~25% of trials of both dysphagia 1 and thin liquid consistencies. Pt continues to show significant L side droop, leaning to L side making upright positioning for intake difficult, oriented to self only. Pt is at high risk of aspiration given cognitive status and s/s observed at bedside. Recommend that pt remain NPO with frequent oral care pending MBS to objectively evaluate swallow function. Plan for MBS later today.    HPI HPI: Pt is a 83 yo female who presented with recurrent left sided weakness (pt was admitted in April 2018 for stroke). CT Head showed an acute 3.4 x 3.1 cm RIGHT thalamic hemorrhage with intraventricular extension, RIGHT occipital horn entrapment. 4 mmRIGHT to LEFT midline shift. BSE 01/20/17 showed mild left-sided weakness and cough x1 with thin liquids, recommending Dys 2 diet and nectar thick liquids. PMH includes: recent CVA, HTN, gout, L BKA, DM, CHF, afib      SLP Plan  MBS       Recommendations  Diet recommendations: NPO Medication Administration: Via alternative means                Oral Care Recommendations: Oral care QID Follow up Recommendations: Skilled Nursing facility SLP Visit Diagnosis: Dysphagia, unspecified (R13.10) Plan: MBS       GO                Loye Vento Cecille AverK Abryanna Musolino, MA, CCC-SLP 02/12/2017, 9:05 AM

## 2017-02-13 ENCOUNTER — Inpatient Hospital Stay (HOSPITAL_COMMUNITY): Payer: Medicare Other

## 2017-02-13 DIAGNOSIS — R509 Fever, unspecified: Secondary | ICD-10-CM

## 2017-02-13 DIAGNOSIS — I4891 Unspecified atrial fibrillation: Secondary | ICD-10-CM

## 2017-02-13 DIAGNOSIS — I6523 Occlusion and stenosis of bilateral carotid arteries: Secondary | ICD-10-CM

## 2017-02-13 LAB — CBC WITH DIFFERENTIAL/PLATELET
BASOS ABS: 0 10*3/uL (ref 0.0–0.1)
Basophils Relative: 0 %
Eosinophils Absolute: 0 10*3/uL (ref 0.0–0.7)
Eosinophils Relative: 0 %
HEMATOCRIT: 37.2 % (ref 36.0–46.0)
HEMOGLOBIN: 12.3 g/dL (ref 12.0–15.0)
LYMPHS PCT: 8 %
Lymphs Abs: 1.2 10*3/uL (ref 0.7–4.0)
MCH: 32.3 pg (ref 26.0–34.0)
MCHC: 33.1 g/dL (ref 30.0–36.0)
MCV: 97.6 fL (ref 78.0–100.0)
MONO ABS: 1.3 10*3/uL — AB (ref 0.1–1.0)
MONOS PCT: 8 %
Neutro Abs: 12.7 10*3/uL — ABNORMAL HIGH (ref 1.7–7.7)
Neutrophils Relative %: 84 %
Platelets: 203 10*3/uL (ref 150–400)
RBC: 3.81 MIL/uL — AB (ref 3.87–5.11)
RDW: 14.9 % (ref 11.5–15.5)
WBC: 15.1 10*3/uL — ABNORMAL HIGH (ref 4.0–10.5)

## 2017-02-13 LAB — URINALYSIS, ROUTINE W REFLEX MICROSCOPIC
BILIRUBIN URINE: NEGATIVE
Glucose, UA: NEGATIVE mg/dL
KETONES UR: NEGATIVE mg/dL
Nitrite: NEGATIVE
PH: 5 (ref 5.0–8.0)
PROTEIN: NEGATIVE mg/dL
Specific Gravity, Urine: 1.013 (ref 1.005–1.030)

## 2017-02-13 LAB — BASIC METABOLIC PANEL
ANION GAP: 8 (ref 5–15)
BUN: 11 mg/dL (ref 6–20)
CHLORIDE: 102 mmol/L (ref 101–111)
CO2: 26 mmol/L (ref 22–32)
Calcium: 8.7 mg/dL — ABNORMAL LOW (ref 8.9–10.3)
Creatinine, Ser: 0.61 mg/dL (ref 0.44–1.00)
GFR calc Af Amer: >60 mL/min (ref >60)
GLUCOSE: 121 mg/dL — AB (ref 65–99)
POTASSIUM: 3.1 mmol/L — AB (ref 3.5–5.1)
Sodium: 136 mmol/L (ref 135–145)

## 2017-02-13 LAB — GLUCOSE, CAPILLARY
GLUCOSE-CAPILLARY: 124 mg/dL — AB (ref 65–99)
Glucose-Capillary: 140 mg/dL — ABNORMAL HIGH (ref 65–99)
Glucose-Capillary: 142 mg/dL — ABNORMAL HIGH (ref 65–99)
Glucose-Capillary: 124 mg/dL — ABNORMAL HIGH (ref 65–99)

## 2017-02-13 MED ORDER — ACETAMINOPHEN 325 MG PO TABS
650.0000 mg | ORAL_TABLET | Freq: Four times a day (QID) | ORAL | Status: DC | PRN
  Administered 2017-02-13 – 2017-02-15 (×2): 650 mg via ORAL
  Filled 2017-02-13 (×2): qty 2

## 2017-02-13 MED ORDER — METOPROLOL TARTRATE 50 MG PO TABS
75.0000 mg | ORAL_TABLET | Freq: Two times a day (BID) | ORAL | Status: DC
  Administered 2017-02-13 (×2): 75 mg via ORAL
  Filled 2017-02-13 (×3): qty 1

## 2017-02-13 MED ORDER — FUROSEMIDE 10 MG/ML IJ SOLN
40.0000 mg | Freq: Once | INTRAMUSCULAR | Status: AC
  Administered 2017-02-13: 40 mg via INTRAVENOUS
  Filled 2017-02-13: qty 4

## 2017-02-13 NOTE — Progress Notes (Signed)
STROKE TEAM PROGRESS NOTE   SUBJECTIVE (INTERVAL HISTORY) RN at bedside, no family at bedside. Pt awake alert today, but febrile 100.5, likely due to aspiration. She passed swallow yesterday on nectar thick liquid. Mild wheezing, CXR showed vascular congestion, will give lasix one dose. Will do blood culture and put on unasyn. UA pending.    OBJECTIVE Temp:  [97.9 F (36.6 C)-100.5 F (38.1 C)] 100.5 F (38.1 C) (05/20 0723) Pulse Rate:  [54-139] 119 (05/20 0700) Cardiac Rhythm: Atrial fibrillation (05/19 2000) Resp:  [17-31] 25 (05/20 0700) BP: (97-173)/(49-128) 132/98 (05/20 0700) SpO2:  [91 %-100 %] 93 % (05/20 0700)  CBC:   Recent Labs Lab 02/10/17 2120 02/10/17 2128  WBC 8.4  --   NEUTROABS 5.1  --   HGB 13.3 13.6  HCT 40.6 40.0  MCV 98.3  --   PLT 236  --     Basic Metabolic Panel:   Recent Labs Lab 02/10/17 2120 02/10/17 2128  NA 141 143  K 3.7 3.6  CL 108 107  CO2 24  --   GLUCOSE 155* 159*  BUN 16 18  CREATININE 0.70 0.60  CALCIUM 9.2  --     Lipid Panel:     Component Value Date/Time   CHOL 197 01/20/2017 0416   TRIG 66 01/20/2017 0416   HDL 40 (L) 01/20/2017 0416   CHOLHDL 4.9 01/20/2017 0416   VLDL 13 01/20/2017 0416   LDLCALC 144 (H) 01/20/2017 0416   HgbA1c:  Lab Results  Component Value Date   HGBA1C 6.1 (H) 01/20/2017   Urine Drug Screen:     Component Value Date/Time   LABOPIA NONE DETECTED 01/19/2017 1431   COCAINSCRNUR NONE DETECTED 01/19/2017 1431   LABBENZ NONE DETECTED 01/19/2017 1431   AMPHETMU NONE DETECTED 01/19/2017 1431   THCU NONE DETECTED 01/19/2017 1431   LABBARB NONE DETECTED 01/19/2017 1431    Alcohol Level     Component Value Date/Time   ETH <5 01/19/2017 1646    IMAGING I have personally reviewed the radiological images below and agree with the radiology interpretations.  Ct Head Code Stroke W/o Cm 02/10/2017 1. Acute 3.4 x 3.1 cm RIGHT thalamic hemorrhage with intraventricular extension, RIGHT  occipital horn entrapment. 4 mm RIGHT to LEFT midline shift.  2. 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  3. ASPECTS is 10.    CTA Head and Neck 02/11/2017 No enlargement of the right thalamic intraparenchymal hematoma. Ventricular size remains stable. Aortic atherosclerosis without aneurysm or dissection. 30% stenosis of the left common carotid artery origin.  40% stenosis of the left subclavian artery origin. Severe atherosclerotic disease in the right ICA bulb. Serial stenoses 80% or greater. Severe atherosclerotic disease in the left ICA bulb. Stenosis as severe as 80%. Circumferential calcification in the carotid siphon regions with stenosis estimated at 50% bilaterally. 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 Severe right vertebral artery origin stenosis, 80% or greater. Estimated 50% stenosis of the left vertebral artery origin.  Focal stenoses along the cervical course of both vertebral arteries up to 50%.  Stenoses of the vertebral arteries at the foramen magnum level, 70% on the right and 50% on the left. No basilar stenosis. No sign of vascular lesion to explain the right the right thalamic hemorrhage.  MRI  01/20/17  1. Small acute right PCA infarcts in the right thalamus, medial right  temporal lobe, and right occipital lobe. 2. Incomplete motion degraded study. Only T1 and diffusion sequences were acquired before the patient terminated exam.  CUS  01/20/17  Severe atherosclerotic disease involving bilateral carotid arteries. No flow identified in the proximal and mid right internal carotid artery but difficult to evaluate due to shadowing plaque. Findings are concerning for a critical stenosis or segmental occlusion in the proximal right internal carotid artery. High-grade stenosis in the left internal carotid artery  measuring greater than 70%. Difficult to assess the disease in the left internal carotid artery due to extensive shadowing plaque. Carotid arteries could be better characterized with MRA or CTA of the neck if needed. Patent vertebral arteries.  TTE  01/19/17 - Left ventricle: The cavity size was normal. There was mild   concentric hypertrophy. Systolic function was normal. The   estimated ejection fraction was in the range of 55% to 60%. Wall   motion was normal; there were no regional wall motion   abnormalities. The study was not technically sufficient to allow   evaluation of LV diastolic dysfunction due to atrial   fibrillation. - Aortic valve: There was moderate stenosis. There was trivial   regurgitation. Mean gradient (S): 15 mm Hg. Valve area (VTI):   1.39 cm^2. - Mitral valve: Calcified annulus. Mildly thickened leaflets .   There was moderate regurgitation. - Left atrium: The atrium was severely dilated. - Right atrium: The atrium was moderately dilated. - Pulmonic valve: There was moderate regurgitation. - Pulmonary arteries: Systolic pressure was moderately increased.   PA peak pressure: 50 mm Hg (S).  Dg Chest Port 1 View 02/13/2017 IMPRESSION: Cardiomegaly, vascular congestion.    PHYSICAL EXAM  Temp:  [97.9 F (36.6 C)-100.5 F (38.1 C)] 100.5 F (38.1 C) (05/20 0723) Pulse Rate:  [54-139] 119 (05/20 0700) Resp:  [17-31] 25 (05/20 0700) BP: (97-173)/(49-128) 132/98 (05/20 0700) SpO2:  [91 %-100 %] 93 % (05/20 0700)  General - Well nourished, well developed, not in acute distress but febrile.  Ophthalmologic - Fundi not visualized due to noncooperation.  Cardiovascular - irregularly irregular heart rate and rhythm with tachycardia.  Neuro - awake alert, febrile. Following simple commands, both central and peripheral commands, orientated to self, age and place but not to time. Moderate dysarthria. PERRL, eyes right gaze preference, left neglect, not cross  midline, not blinking to visual threat on the left. Left facial droop. Left UE 0/5, LLE BKA but 2+/5 proximal on pain stimulation. RUE spontaneous movement against gravity, and withdraw to pain. RUE intact FTN. Sensation not cooperative and gait not tested.   ASSESSMENT/PLAN [name] is a 83 y.o. female with history of hypertension, peripheral vascular disease, atrial fibrillation on anticoagulation, diabetes mellitus, CHF and recent right CVA with left-sided weakness, presenting with recurrent left side weakness with facial droop, slurred speech in setting of extreme hypertension.  CT showed a R thalamic hemorrhage.   Stroke:   R thalamic hemorrhage with IVH secondary to hypertensive emergency and on anticoagulation s/p Kcentral  Resultant  Lethargy, right gaze, left neglect, left hemiplegia  Code Stroke CT R thalamic hmg w/ IVH,  R occipital horn entrapment w/ 4mm R to L shift. 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 6 mm right ACA aneurysm.  Carotid Doppler  12/2016 B ICA stenosis, R ICA critical stenosis/occlusion, L ICA >70%  2D Echo 12/2016 EF 55-60%. No source of embolus   LDL 144  HgbA1c 6.1  SCDs for VTE prophylaxis  DIET DYS 2 Room service appropriate? Yes with Assist; Fluid consistency: Nectar Thick.  Eliquis (apixaban) daily prior to admission, now on no antithrombotics.  Ongoing aggressive stroke risk factor management  Therapy recommendations:  PT recommends SNF  Disposition:  pending  (lives w/ son and DIL)  Hypertensive Emergency  BP 222/144 on arrival  on cleviprex intermittently  Increase metoprolol to 75mg  bid  Off cleviprex as able  SBP goal 130-180 due to b/l ICA stenosis  Avoid hypotensive given critical stenosis  Atrial Fibrillation with RVR  Home anticoagulation:  Eliquis (apixaban) daily    Not an AC d/t hmg  HR 100-120s  Increase metoprolol to 75mg  bid  Bilateral carotid stenosis  01/20/17 CUS right ICA occlusion and left ICA 70% stenosis  CTA head and neck - b/l ICA proximal 80% stenosis  Avoid hypotension  BP goal 130-180   Not a candidate for intervention at this time  Hx stroke/TIA  12/2016 - R PCA scattered infarcts with atrial fibrillation while on eliquis. Found to have B ICA stenosis, R ICA critical stenosis/occlusion, L ICA >70%. EF 55-60% - continued on eliquis  Febrile - likely due to aspiration / dysphagia  Tmax 100.5  WBC 8.4  Dysphagia - on nectar thick liquid  Blood culture pending  UA pending  CXR - vascular congestion  Unasyn trial  Hyperlipidemia  Home meds:  lipitor 10  LDL 144, goal < 70  Resumed lipitor this admission  Plan to continue statin at discharge  Diabetes type II  HgbA1c 6.1, at goal < 7.0  Controlled  SSI  CBG monitoring  Other Stroke Risk Factors  Advanced age  Former Cigarette smoker  hx CHF per record - EF 55-60%  PVD    Hospital day # 3  This patient is critically ill due to right ICH with IVH, b/l carotid stenosis, recent stroke, advanced age and at significant risk of neurological worsening, death form recurrent bleeding, ischemic stroke, seizure, heart failure. This patient's care requires constant monitoring of vital signs, hemodynamics, respiratory and cardiac monitoring, review of multiple databases, neurological assessment, discussion with family, other specialists and medical decision making of high complexity. I spent 40 minutes of neurocritical care time in the care of this patient.  Marvel Plan, MD PhD Stroke Neurology 02/13/2017 9:46 AM   To contact Stroke Continuity provider, please refer to [email or web address]. After hours, contact General Neurology

## 2017-02-13 NOTE — Progress Notes (Signed)
Physical Therapy Treatment Patient Details Name: [name] MRN: [id number] DOB: [birth year] Today's Date: 02/13/2017    History of Present Illness 83 y.o.femalewith a known history of Atrial fibrillation on anticoagulation with eliquis, DM, CHF, dysrhythmia, HTN, PVD, and previous LLE BKA who presented to ED on 4/25 with Right CVA, D/C to SNF and returned home 5/17 with increased left weakness same day and return to ER with right thalamic hemorrhage    PT Comments    Patient seen for OOB progression. Lethargic today, could not attempt sit <> stand or pivot. Utilized +2 dependent lateral scoot transfer with draw sheet. Patient tolerated activity well despite fatigue. Did notice increased redness and edema LUE (shoulder) nsg aware. VSS throughout session. Will continue to see and progress as tolerated.   Follow Up Recommendations  SNF;Supervision/Assistance - 24 hour     Equipment Recommendations  None recommended by PT    Recommendations for Other Services       Precautions / Restrictions Precautions Precautions: Fall Precaution Comments: L BKA, left hemiplegia, right gaze preference Restrictions Weight Bearing Restrictions: Yes LLE Weight Bearing: Non weight bearing    Mobility  Bed Mobility Overal bed mobility: Needs Assistance Bed Mobility: Rolling           General bed mobility comments: patient max to toal assist due to fatigue  Transfers Overall transfer level: Needs assistance Equipment used: Rolling walker (2 wheeled) Transfers: Lateral/Scoot Transfers Sit to Stand: Total assist;+2 physical assistance         General transfer comment: Unable to attempt sit <> stand or pivot today, opted for lateral dependent transfer for OOB to chair activity  Ambulation/Gait                 Stairs            Wheelchair Mobility    Modified Rankin (Stroke Patients Only) Modified Rankin (Stroke Patients Only) Pre-Morbid Rankin Score: Moderate  disability Modified Rankin: Severe disability     Balance Overall balance assessment: Needs assistance Sitting-balance support: Bilateral upper extremity supported Sitting balance-Leahy Scale: Zero Sitting balance - Comments: patient able to initat some forward flexed positioning to sit edge of chair without posterior support with min guard to min assist. fatigues easily Postural control: Posterior lean;Left lateral lean   Standing balance-Leahy Scale: Zero                              Cognition Arousal/Alertness: Lethargic;Suspect due to medications Behavior During Therapy: [facility] for tasks assessed/performed Overall Cognitive Status: No family/caregiver present to determine baseline cognitive functioning Area of Impairment: Orientation;Memory;Following commands;Safety/judgement                 Orientation Level: Disoriented to;Time;Place   Memory: Decreased short-term memory Following Commands: Follows one step commands inconsistently Safety/Judgement: Decreased awareness of deficits;Decreased awareness of safety     General Comments: very minimally communicative today      Exercises      General Comments        Pertinent Vitals/Pain Pain Assessment: Faces Faces Pain Scale: Hurts little more Pain Location: left shoulder Pain Descriptors / Indicators: Grimacing;Guarding Pain Intervention(s): Monitored during session    Home Living Family/patient expects to be discharged to:: Private residence Living Arrangements: Children Available Help at Discharge: Family Type of Home: House Home Access: Ramped entrance   Home Layout: One level Home Equipment: Wheelchair - manual;Bedside commode;Other (comment);Hospital bed Additional Comments: home setup from chart.  Pt lives with Son and daughter in law    Prior Function            PT Goals (current goals can now be found in the care plan section) Acute Rehab PT Goals Patient Stated Goal: be able to get  up PT Goal Formulation: With patient Time For Goal Achievement: 02/26/17 Potential to Achieve Goals: Fair Progress towards PT goals: Progressing toward goals    Frequency    Min 3X/week      PT Plan Current plan remains appropriate    Co-evaluation PT/OT/SLP Co-Evaluation/Treatment: Yes Reason for Co-Treatment: Complexity of the patient's impairments (multi-system involvement);For patient/therapist safety PT goals addressed during session: Mobility/safety with mobility        AM-PAC PT "6 Clicks" Daily Activity  Outcome Measure  Difficulty turning over in bed (including adjusting bedclothes, sheets and blankets)?: Total Difficulty moving from lying on back to sitting on the side of the bed? : Total Difficulty sitting down on and standing up from a chair with arms (e.g., wheelchair, bedside commode, etc,.)?: Total Help needed moving to and from a bed to chair (including a wheelchair)?: Total Help needed walking in hospital room?: Total Help needed climbing 3-5 steps with a railing? : Total 6 Click Score: 6    End of Session Equipment Utilized During Treatment: Gait belt Activity Tolerance: Patient tolerated treatment well Patient left: in chair;with call bell/phone within reach Nurse Communication: Mobility status;Need for lift equipment PT Visit Diagnosis: Muscle weakness (generalized) (M62.81);Other abnormalities of gait and mobility (R26.89);Hemiplegia and hemiparesis Hemiplegia - Right/Left: Left Hemiplegia - dominant/non-dominant: Non-dominant Hemiplegia - caused by: Nontraumatic intracerebral hemorrhage     Time: [id number] PT Time Calculation (min) (ACUTE ONLY): 19 min  Charges:  $Therapeutic Activity: 8-22 mins                    G Codes:       Charlotte Crumbevon Dayra Rapley, PT DPT  [phone]    Fabio Asaevon J Selen Smucker 02/13/2017, 1:05 PM

## 2017-02-13 NOTE — Evaluation (Signed)
Occupational Therapy Evaluation Patient Details Name: [name] MRN: [id number] DOB: Jul 30, 1921 Today's Date: 02/13/2017    History of Present Illness 83 y.o.femalewith a known history of Atrial fibrillation on anticoagulation with eliquis, DM, CHF, dysrhythmia, HTN, PVD, and previous LLE BKA who presented to ED on 4/25 with Right CVA, D/C to SNF and returned home 5/17 with increased left weakness same day and return to ER with right thalamic hemorrhage   Clinical Impression   Unsure of PLOF; no family present and pt unable to provide at this time. Currently pt total assist for ADL and lateral scoot transfers. Pt lethargic this session; occasionally responding to questions and following simple one step commands but mostly with eyes closed and difficult to arouse. Pt presenting with L UE weakness, L shoulder edema/redness/hot to touch, R gaze preference but does track past midline with cues, impaired balance, impaired communication, ?impaired cognition impacting her independence and safety with ADL and functional mobility. Recommending SNF for follow up to maximize independence and safety with ADL and functional mobility prior to return home with family. Pt would benefit from continued skilled OT to address established goals.    Follow Up Recommendations  SNF;Supervision/Assistance - 24 hour    Equipment Recommendations  Other (comment) (TBD at next venue)    Recommendations for Other Services       Precautions / Restrictions Precautions Precautions: Fall Precaution Comments: L BKA, left hemiplegia, right gaze preference Restrictions Weight Bearing Restrictions: Yes LLE Weight Bearing: Non weight bearing      Mobility Bed Mobility Overal bed mobility: Needs Assistance Bed Mobility: Rolling           General bed mobility comments: patient max to toal assist due to fatigue  Transfers Overall transfer level: Needs assistance Equipment used: Rolling walker (2  wheeled) Transfers: Lateral/Scoot Transfers Sit to Stand: Total assist;+2 physical assistance         General transfer comment: Unable to attempt sit <> stand or pivot today, opted for lateral dependent transfer for OOB to chair activity    Balance Overall balance assessment: Needs assistance Sitting-balance support: Bilateral upper extremity supported Sitting balance-Leahy Scale: Zero Sitting balance - Comments: patient able to initat some forward flexed positioning to sit edge of chair without posterior support with min guard to min assist. fatigues easily Postural control: Posterior lean;Left lateral lean   Standing balance-Leahy Scale: Zero                             ADL either performed or assessed with clinical judgement   ADL Overall ADL's : Needs assistance/impaired                                       General ADL Comments: Pt currently total assist for ADL. Pt lethargic, minimal following of commands this session; did perform ROM on RUE to command.      Vision   Vision Assessment?: Yes Ocular Range of Motion: Impaired-to be further tested in functional context Alignment/Gaze Preference: Gaze right Tracking/Visual Pursuits: Impaired - to be further tested in functional context (does track past midline on L) Additional Comments: Decreased attention to L side visually; with cues can track past midline but does not sustain     Perception     Praxis      Pertinent Vitals/Pain Pain Assessment: Faces Faces Pain Scale: Hurts little  more Pain Location: left shoulder Pain Descriptors / Indicators: Grimacing;Guarding Pain Intervention(s): Monitored during session     Hand Dominance     Extremity/Trunk Assessment Upper Extremity Assessment Upper Extremity Assessment: LUE deficits/detail LUE Deficits / Details: No active movement noted. Does appear pt is in pain with passive range. Increased edema, redness, and heat at shoulder-RN  notified. LUE Coordination: decreased fine motor;decreased gross motor   Lower Extremity Assessment Lower Extremity Assessment: Defer to PT evaluation   Cervical / Trunk Assessment Cervical / Trunk Assessment: Kyphotic   Communication Communication Communication: HOH;Expressive difficulties   Cognition Arousal/Alertness: Lethargic;Suspect due to medications Behavior During Therapy: [facility] for tasks assessed/performed Overall Cognitive Status: No family/caregiver present to determine baseline cognitive functioning Area of Impairment: Orientation;Memory;Following commands;Safety/judgement                 Orientation Level: Disoriented to;Time;Place   Memory: Decreased short-term memory Following Commands: Follows one step commands inconsistently Safety/Judgement: Decreased awareness of deficits;Decreased awareness of safety     General Comments: very minimally communicative today   General Comments       Exercises     Shoulder Instructions      Home Living Family/patient expects to be discharged to:: Private residence Living Arrangements: Children Available Help at Discharge: Family Type of Home: House Home Access: Ramped entrance     Home Layout: One level     Bathroom Shower/Tub: Producer, television/film/video: Handicapped height Bathroom Accessibility: Yes   Home Equipment: Wheelchair - manual;Bedside commode;Other (comment);Hospital bed   Additional Comments: home setup from chart. Pt lives with Son and daughter in law  Lives With: Family    Prior Functioning/Environment          Comments: Unsure, pt unable to provide and no family present        OT Problem List: Decreased strength;Decreased range of motion;Decreased activity tolerance;Impaired balance (sitting and/or standing);Impaired vision/perception;Decreased coordination;Decreased cognition;Impaired sensation;Impaired tone;Obesity;Impaired UE functional use;Pain;Increased edema      OT  Treatment/Interventions: Self-care/ADL training;Therapeutic exercise;Neuromuscular education;Energy conservation;DME and/or AE instruction;Therapeutic activities;Cognitive remediation/compensation;Visual/perceptual remediation/compensation;Patient/family education;Balance training    OT Goals(Current goals can be found in the care plan section) Acute Rehab OT Goals Patient Stated Goal: none stated OT Goal Formulation: Patient unable to participate in goal setting Time For Goal Achievement: 02/27/17 Potential to Achieve Goals: Fair ADL Goals Pt Will Perform Grooming: with min assist;sitting Pt Will Transfer to Toilet: with max assist;stand pivot transfer;bedside commode Additional ADL Goal #1: Pt will follow one step command 75% of the time in a minimally distracting environment. Additional ADL Goal #2: Pt will sit EOB with min guard assist x10 minutes as precursor to ADL.  OT Frequency: Min 2X/week   Barriers to D/C:            Co-evaluation PT/OT/SLP Co-Evaluation/Treatment: Yes Reason for Co-Treatment: Complexity of the patient's impairments (multi-system involvement);For patient/therapist safety PT goals addressed during session: Mobility/safety with mobility OT goals addressed during session: ADL's and self-care      AM-PAC PT "6 Clicks" Daily Activity     Outcome Measure Help from another person eating meals?: Total Help from another person taking care of personal grooming?: Total Help from another person toileting, which includes using toliet, bedpan, or urinal?: Total Help from another person bathing (including washing, rinsing, drying)?: Total Help from another person to put on and taking off regular upper body clothing?: Total Help from another person to put on and taking off regular lower body clothing?: Total 6 Click  Score: 6   End of Session Nurse Communication: Mobility status;Other (comment) (L shoulder edema and redness)  Activity Tolerance: Patient limited by  lethargy Patient left: in chair;with call bell/phone within reach  OT Visit Diagnosis: Muscle weakness (generalized) (M62.81);Hemiplegia and hemiparesis;Pain                Time: [id number] OT Time Calculation (min): 19 min Charges:  OT General Charges $OT Visit: 1 Procedure OT Evaluation $OT Eval Moderate Complexity: 1 Procedure G-Codes:     Tashan Kreitzer A. Brett Albinooffey, M.S., OTR/L Pager: [phone]  Gaye AlkenBailey A Elmire Amrein 02/13/2017, 1:32 PM

## 2017-02-13 NOTE — Progress Notes (Signed)
Pt's left shoulder seemingly more swollen than the R.  When moving the shoulder, she complains of some pain.  Shoulder also seems a little red and is warm to the touch.  Notified Dr. Roda ShuttersXu of this.  He will assess when he arrives back to the unit.  Will continue to monitor pt.

## 2017-02-14 ENCOUNTER — Inpatient Hospital Stay (HOSPITAL_COMMUNITY): Payer: Medicare Other

## 2017-02-14 DIAGNOSIS — M7989 Other specified soft tissue disorders: Secondary | ICD-10-CM

## 2017-02-14 LAB — GLUCOSE, CAPILLARY
GLUCOSE-CAPILLARY: 107 mg/dL — AB (ref 65–99)
GLUCOSE-CAPILLARY: 98 mg/dL (ref 65–99)
GLUCOSE-CAPILLARY: 117 mg/dL — AB (ref 65–99)
Glucose-Capillary: 190 mg/dL — ABNORMAL HIGH (ref 65–99)
Glucose-Capillary: 165 mg/dL — ABNORMAL HIGH (ref 65–99)

## 2017-02-14 LAB — BASIC METABOLIC PANEL
Anion gap: 9 (ref 5–15)
BUN: 12 mg/dL (ref 6–20)
CHLORIDE: 102 mmol/L (ref 101–111)
CO2: 28 mmol/L (ref 22–32)
CREATININE: 0.63 mg/dL (ref 0.44–1.00)
Calcium: 8.4 mg/dL — ABNORMAL LOW (ref 8.9–10.3)
GFR calc non Af Amer: >60 mL/min (ref >60)
Glucose, Bld: 106 mg/dL — ABNORMAL HIGH (ref 65–99)
Potassium: 3 mmol/L — ABNORMAL LOW (ref 3.5–5.1)
SODIUM: 139 mmol/L (ref 135–145)

## 2017-02-14 LAB — CBC
HCT: 34.6 % — ABNORMAL LOW (ref 36.0–46.0)
HEMOGLOBIN: 11.4 g/dL — AB (ref 12.0–15.0)
MCH: 32.2 pg (ref 26.0–34.0)
MCHC: 32.9 g/dL (ref 30.0–36.0)
MCV: 97.7 fL (ref 78.0–100.0)
Platelets: 167 10*3/uL (ref 150–400)
RBC: 3.54 MIL/uL — ABNORMAL LOW (ref 3.87–5.11)
RDW: 15 % (ref 11.5–15.5)
WBC: 12.8 10*3/uL — ABNORMAL HIGH (ref 4.0–10.5)

## 2017-02-14 MED ORDER — LABETALOL HCL 5 MG/ML IV SOLN: 10.0000 mg | INTRAVENOUS | Status: DC | PRN

## 2017-02-14 MED ORDER — POTASSIUM CHLORIDE 20 MEQ PO PACK
20.0000 meq | PACK | Freq: Two times a day (BID) | ORAL | Status: AC
  Administered 2017-02-14 – 2017-02-16 (×5): 20 meq via ORAL
  Filled 2017-02-14 (×6): qty 1

## 2017-02-14 MED ORDER — FUROSEMIDE 10 MG/ML IJ SOLN
40.0000 mg | Freq: Once | INTRAMUSCULAR | Status: AC
  Administered 2017-02-14: 40 mg via INTRAVENOUS
  Filled 2017-02-14: qty 4

## 2017-02-14 MED ORDER — METOPROLOL TARTRATE 100 MG PO TABS
100.0000 mg | ORAL_TABLET | Freq: Two times a day (BID) | ORAL | Status: DC
  Administered 2017-02-14 – 2017-02-16 (×5): 100 mg via ORAL
  Filled 2017-02-14: qty 1
  Filled 2017-02-14: qty 2
  Filled 2017-02-14: qty 1
  Filled 2017-02-14 (×2): qty 2

## 2017-02-14 MED ORDER — PANTOPRAZOLE SODIUM 40 MG PO TBEC
40.0000 mg | DELAYED_RELEASE_TABLET | Freq: Every day | ORAL | Status: DC
  Administered 2017-02-14 – 2017-02-17 (×3): 40 mg via ORAL
  Filled 2017-02-14 (×3): qty 1

## 2017-02-14 MED ORDER — POTASSIUM CHLORIDE 20 MEQ PO PACK
20.0000 meq | PACK | Freq: Every day | ORAL | Status: DC
  Administered 2017-02-17: 20 meq via ORAL
  Filled 2017-02-14: qty 1

## 2017-02-14 NOTE — Progress Notes (Signed)
STROKE TEAM PROGRESS NOTE   SUBJECTIVE (INTERVAL HISTORY) RN at bedside, no family at bedside. Pt awake alert today, afebrile. Left arm warm and swollen, concerning for left upper DVT. She still has mild wheezing, will continue lasix and discontinue IVF. Still has AFib with RVR, will increase metoprolol dose.    OBJECTIVE Temp:  [97.7 F (36.5 C)-99.7 F (37.6 C)] 99 F (37.2 C) (05/21 0350) Pulse Rate:  [41-129] 109 (05/21 0800) Resp:  [19-33] 22 (05/21 0800) BP: (107-166)/(52-111) 118/58 (05/21 0800) SpO2:  [94 %-99 %] 97 % (05/21 0800)  CBC:   Recent Labs Lab 02/10/17 2120 02/10/17 2128 02/13/17 0918  WBC 8.4  --  15.1*  NEUTROABS 5.1  --  12.7*  HGB 13.3 13.6 12.3  HCT 40.6 40.0 37.2  MCV 98.3  --  97.6  PLT 236  --  203    Basic Metabolic Panel:   Recent Labs Lab 02/10/17 2120 02/10/17 2128 02/13/17 0918  NA 141 143 136  K 3.7 3.6 3.1*  CL 108 107 102  CO2 24  --  26  GLUCOSE 155* 159* 121*  BUN 16 18 11   CREATININE 0.70 0.60 0.61  CALCIUM 9.2  --  8.7*    Lipid Panel:     Component Value Date/Time   CHOL 197 01/20/2017 0416   TRIG 66 01/20/2017 0416   HDL 40 (L) 01/20/2017 0416   CHOLHDL 4.9 01/20/2017 0416   VLDL 13 01/20/2017 0416   LDLCALC 144 (H) 01/20/2017 0416   HgbA1c:  Lab Results  Component Value Date   HGBA1C 6.1 (H) 01/20/2017   Urine Drug Screen:     Component Value Date/Time   LABOPIA NONE DETECTED 01/19/2017 1431   COCAINSCRNUR NONE DETECTED 01/19/2017 1431   LABBENZ NONE DETECTED 01/19/2017 1431   AMPHETMU NONE DETECTED 01/19/2017 1431   THCU NONE DETECTED 01/19/2017 1431   LABBARB NONE DETECTED 01/19/2017 1431    Alcohol Level     Component Value Date/Time   ETH <5 01/19/2017 1646    IMAGING I have personally reviewed the radiological images below and agree with the radiology interpretations.  Ct Head Code Stroke W/o Cm 02/10/2017 1. Acute 3.4 x 3.1 cm RIGHT thalamic hemorrhage with intraventricular extension,  RIGHT occipital horn entrapment. 4 mm RIGHT to LEFT midline shift.  2. 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  3. ASPECTS is 10.    CTA Head and Neck 02/11/2017 No enlargement of the right thalamic intraparenchymal hematoma. Ventricular size remains stable. Aortic atherosclerosis without aneurysm or dissection. 30% stenosis of the left common carotid artery origin.  40% stenosis of the left subclavian artery origin. Severe atherosclerotic disease in the right ICA bulb. Serial stenoses 80% or greater. Severe atherosclerotic disease in the left ICA bulb. Stenosis as severe as 80%. Circumferential calcification in the carotid siphon regions with stenosis estimated at 50% bilaterally. 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 Severe right vertebral artery origin stenosis, 80% or greater. Estimated 50% stenosis of the left vertebral artery origin.  Focal stenoses along the cervical course of both vertebral arteries up to 50%.  Stenoses of the vertebral arteries at the foramen magnum level, 70% on the right and 50% on the left. No basilar stenosis. No sign of vascular lesion to explain the right the right thalamic hemorrhage.  MRI  01/20/17  1. Small acute right PCA  infarcts in the right thalamus, medial right temporal lobe, and right occipital lobe. 2. Incomplete motion degraded study. Only T1 and diffusion sequences were acquired before the patient terminated exam.  CUS  01/20/17  Severe atherosclerotic disease involving bilateral carotid arteries. No flow identified in the proximal and mid right internal carotid artery but difficult to evaluate due to shadowing plaque. Findings are concerning for a critical stenosis or segmental occlusion in the proximal right internal carotid artery. High-grade stenosis in the left internal carotid  artery measuring greater than 70%. Difficult to assess the disease in the left internal carotid artery due to extensive shadowing plaque. Carotid arteries could be better characterized with MRA or CTA of the neck if needed. Patent vertebral arteries.  TTE  01/19/17 - Left ventricle: The cavity size was normal. There was mild   concentric hypertrophy. Systolic function was normal. The   estimated ejection fraction was in the range of 55% to 60%. Wall   motion was normal; there were no regional wall motion   abnormalities. The study was not technically sufficient to allow   evaluation of LV diastolic dysfunction due to atrial   fibrillation. - Aortic valve: There was moderate stenosis. There was trivial   regurgitation. Mean gradient (S): 15 mm Hg. Valve area (VTI):   1.39 cm^2. - Mitral valve: Calcified annulus. Mildly thickened leaflets .   There was moderate regurgitation. - Left atrium: The atrium was severely dilated. - Right atrium: The atrium was moderately dilated. - Pulmonic valve: There was moderate regurgitation. - Pulmonary arteries: Systolic pressure was moderately increased.   PA peak pressure: 50 mm Hg (S).  Dg Chest Port 1 View 02/13/2017 IMPRESSION: Cardiomegaly, vascular congestion.  02/14/2017 IMPRESSION: 1. Cardiomegaly. Improvement pulmonary vascular congestion. Small bilateral pleural effusions. 2. Low lung volumes.   Head CT repeat pending  LUE venous doppler pending    PHYSICAL EXAM  Temp:  [97.7 F (36.5 C)-99.7 F (37.6 C)] 99 F (37.2 C) (05/21 0350) Pulse Rate:  [41-129] 109 (05/21 0800) Resp:  [19-33] 22 (05/21 0800) BP: (107-166)/(52-111) 118/58 (05/21 0800) SpO2:  [94 %-99 %] 97 % (05/21 0800)  General - Well nourished, well developed, not in acute distress but febrile.  Ophthalmologic - Fundi not visualized due to noncooperation.  Cardiovascular - irregularly irregular heart rate and rhythm with tachycardia.  Neuro - awake alert,  afebrile. Following simple commands, both central and peripheral commands, orientated to self, age and place but not to time. Severe dysarthria. PERRL, eyes right gaze preference, left neglect, not cross midline, not blinking to visual threat on the left. Left facial droop. Left UE 0/5, LLE BKA but 2+/5 proximal on pain stimulation. LUE swollen and warm at hand and shoulder and distal forearm. RUE spontaneous movement against gravity, and withdraw to pain. RUE intact FTN. Sensation not cooperative and gait not tested.   ASSESSMENT/PLAN Ms. Nolon NationsHelmi Montesinos is a 83 y.o. female with history of hypertension, peripheral vascular disease, atrial fibrillation on anticoagulation, diabetes mellitus, CHF and recent right CVA with left-sided weakness, presenting with recurrent left side weakness with facial droop, slurred speech in setting of extreme hypertension.  CT showed a R thalamic hemorrhage.   Stroke:   R thalamic hemorrhage with IVH secondary to hypertensive emergency and on anticoagulation s/p Kcentral  Resultant  Lethargy, right gaze, left neglect, left hemiplegia  Code Stroke CT R thalamic hmg w/ IVH,  R occipital horn entrapment w/ 4mm R to L shift. 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 6 mm right ACA aneurysm.  Carotid Doppler  12/2016 B ICA stenosis, R ICA critical stenosis/occlusion, L ICA >70%  2D Echo 12/2016 EF 55-60%. No source of embolus   LDL 144  HgbA1c 6.1  SCDs for VTE prophylaxis  DIET DYS 2 Room service appropriate? Yes with Assist; Fluid consistency: Nectar Thick.  Eliquis (apixaban) daily prior to admission, now on no antithrombotics.  Ongoing aggressive stroke risk factor management  Therapy recommendations:  SNF  Disposition:  pending  (lives w/ son and DIL)  Hypertensive Emergency  BP 222/144 on  arrival  on cleviprex intermittently  Increase metoprolol to 75mg  bid  Off cleviprex as able  SBP goal 130-180 due to b/l ICA stenosis  Avoid hypotension given critical stenosis  Atrial Fibrillation with RVR  Home anticoagulation:  Eliquis (apixaban) daily   Not an AC d/t hmg  HR 100-120s  Increase metoprolol to 100mg  bid  Bilateral carotid stenosis  01/20/17 CUS right ICA occlusion and left ICA 70% stenosis  CTA head and neck - b/l ICA proximal 80% stenosis  Avoid hypotension - 118/58 Monday AM - other pressures better - monitor  BP goal 130-180   Not a candidate for intervention at this time  Hx stroke/TIA  12/2016 - R PCA scattered infarcts with atrial fibrillation while on eliquis. Found to have B ICA stenosis, R ICA critical stenosis/occlusion, L ICA >70%. EF 55-60% - continued on eliquis  ? LUE DVT  LUE warm and swollen  Venous doppler pending  LUE elevation  Febrile - likely due to aspiration / dysphagia  Tmax 100.5 -> 97.7  WBC 8.4 -> 15.1 - 12.8  Dysphagia - on nectar thick liquid  Blood culture - NGTD  UA WBC 6-30  CXR repeat - vascular congestion improved  CHF  Wheezing improved  CXR vascular congestion improved  Lasix 40mg  today  D/C IVF  Encourage PO intake  Hyperlipidemia  Home meds:  lipitor 10  LDL 144, goal < 70  Resumed lipitor this admission  continue statin at discharge  Diabetes type II  HgbA1c 6.1, at goal < 7.0  Controlled  SSI  CBG monitoring  Other Stroke Risk Factors  Advanced age  Former Cigarette smoker  Hx CHF per record - EF 55-60%  PVD  Hypokalemia - 3.1 (on lasix 40 mg daily) -> supplement further and monitor.    Hospital day # 4  This patient is critically ill due to right ICH with IVH, b/l carotid stenosis, recent stroke, advanced age and at significant risk of neurological worsening, death form recurrent bleeding, ischemic stroke, seizure, heart failure. This patient's care  requires constant monitoring of vital signs, hemodynamics, respiratory and cardiac monitoring, review of multiple databases, neurological assessment, discussion with family, other specialists and medical decision making of high complexity. I spent 40 minutes of neurocritical care time in the care of this patient.  Marvel Plan, MD PhD Stroke Neurology 02/14/2017 10:35 AM   To contact Stroke Continuity provider, please refer to [email or web address]. After hours, contact General Neurology

## 2017-02-14 NOTE — Progress Notes (Signed)
Initial Nutrition Assessment  DOCUMENTATION CODES:   Not applicable  INTERVENTION:   Magic cup TID with meals, each supplement provides 290 kcal and 9 grams of protein  NUTRITION DIAGNOSIS:   Inadequate oral intake related to acute illness, dysphagia as evidenced by meal completion < 25%.  GOAL:   Patient will meet greater than or equal to 90% of their needs  MONITOR:   PO intake, Supplement acceptance, Labs, Weight trends  REASON FOR ASSESSMENT:   Low Braden    ASSESSMENT:   83 y.o. female with history of hypertension, peripheral vascular disease, atrial fibrillation on anticoagulation, diabetes mellitus, CHF and recent right CVA with left-sided weakness, presenting with recurrent left side weakness with facial droop, slurred speech in setting of extreme hypertension.    Unable to communicate with pt. Pt had meal tray on side table at time of RD visit which was <25% eaten. Pt documented to be eating 25% meals. Pt on dysphagia 1 diet/nectar thick diet; RD will order Magic Cups. Per chart, pt has lost 7% in one year which is not significant. Continue to encourage intake of meals and supplements. Recommend nutrition support via cortrak if pt unable to meet estimated needs in 3-4 days and pending pt's goals of care.   Medications reviewed and include: Vit D, ferrous sulfate, lasix, insulin, protonix, KCl, Vit B-6, selenium, senokot, Vit B-12, zinc sulfate  Labs reviewed: K 3.1(L), Ca 8.7(L) Wbc- 12.8(H)  Nutrition-Focused physical exam completed. Findings are mild fat depletion in chest, moderate muscle depletion in clavicles and severe depletion in hands, and moderate edema in LUE.   Diet Order:  DIET - DYS 1 Room service appropriate? Yes; Fluid consistency: Nectar Thick  Skin:  Reviewed, no issues  Last BM:  none since admit  Height:   Ht Readings from Last 1 Encounters:  02/10/17 5\' 6"  (1.676 m)    Weight:   Wt Readings from Last 1 Encounters:  02/14/17 171 lb  11.2 oz (77.9 kg)    Ideal Body Weight:  59 kg  BMI:  Body mass index is 27.71 kg/m.  Estimated Nutritional Needs:   Kcal:  1600-1800kcal/day   Protein:  73-87g/day   Fluid:  >1.6L/day   EDUCATION NEEDS:   No education needs identified at this time  Betsey Holidayasey Thornton Dohrmann MS, RD, LDN Pager #- [phone]

## 2017-02-14 NOTE — Progress Notes (Signed)
Today the patient's L arm noted to be warm, swollen, redness (mainly in the hand joints). Daughter-in-law stated that the patient had a recent flare up of gout in the R hand and it was treated for about 3 weeks (unsure of which medication) at the rehab facility with resolution of the symptoms prior to discharge. She stated that today the L hand is looking the same way and is concerned for possible flare up of gout (hand and/or shoulder). MD paged to make aware.

## 2017-02-14 NOTE — Progress Notes (Signed)
  Speech Language Pathology Treatment: Dysphagia  Patient Details Name: [name] MRN: [id number] DOB: 06/26/1921 Today's Date: 02/14/2017 Time: [id number] SLP Time Calculation (min) (ACUTE ONLY): 15 min  Assessment / Plan / Recommendation Clinical Impression  Pt was seen for f/u during breakfast meal. Dentures were placed to optimize mastication, although she still had significantly prolonged bolus preparation with them in place. SLP attempted bolus manipulation by cutting the food into smaller pieces and using extra sauce to soften it. Mod-Max cues were also provided for lingual sweep and use of puree/liquid bolus, still with prolonged bolus formation and left buccal pocketing observed. After only a few bites she reported that she was too fatigued to continue. She consumed a few more bites of pureed solids with decreased oral transit time and decreased residue. One instance of wet vocal quality was noted s/p cup sip of nectar thick liquids, but pt spontaneously cleared it with a throat clear. Recommend changing diet to Dys 1 textures, continuing nectar thick liquids, to increase safety and conserve energy.    HPI HPI: Pt is a 83 yo female who presented with recurrent left sided weakness (pt was admitted in April 2018 for stroke). CT Head showed an acute 3.4 x 3.1 cm RIGHT thalamic hemorrhage with intraventricular extension, RIGHT occipital horn entrapment. 4 mmRIGHT to LEFT midline shift. BSE 01/20/17 showed mild left-sided weakness and cough x1 with thin liquids, recommending Dys 2 diet and nectar thick liquids. PMH includes: recent CVA, HTN, gout, L BKA, DM, CHF, afib      SLP Plan  Continue with current plan of care       Recommendations  Diet recommendations: Dysphagia 1 (puree);Nectar-thick liquid Liquids provided via: Cup;Straw Medication Administration: Crushed with puree Supervision: Staff to assist with self feeding;Full supervision/cueing for compensatory  strategies Compensations: Minimize environmental distractions;Slow rate;Small sips/bites;Lingual sweep for clearance of pocketing;Monitor for anterior loss Postural Changes and/or Swallow Maneuvers: Seated upright 90 degrees;Upright 30-60 min after meal                Oral Care Recommendations: Oral care QID;Oral care before and after PO Follow up Recommendations: Skilled Nursing facility SLP Visit Diagnosis: Dysphagia, oropharyngeal phase (R13.12) Plan: Continue with current plan of care       GO                [name], [name] 02/14/2017, 9:56 AM  [name], M.A. CCC-SLP [phone]

## 2017-02-14 NOTE — Progress Notes (Signed)
**  Preliminary report by tech**  Left upper extremity venous duplex complete. There is no obvious evidence of deep or superficial vein thrombosis involving the left upper extremity. All clearly visualized vessels appear patent and compressible.  02/14/17 5:07 PM Olen CordialGreg Kaitlinn Iversen RVT

## 2017-02-15 ENCOUNTER — Inpatient Hospital Stay (HOSPITAL_COMMUNITY): Payer: Medicare Other

## 2017-02-15 DIAGNOSIS — I509 Heart failure, unspecified: Secondary | ICD-10-CM

## 2017-02-15 LAB — BASIC METABOLIC PANEL
ANION GAP: 8 (ref 5–15)
BUN: 16 mg/dL (ref 6–20)
CO2: 29 mmol/L (ref 22–32)
CREATININE: 0.62 mg/dL (ref 0.44–1.00)
Calcium: 8.6 mg/dL — ABNORMAL LOW (ref 8.9–10.3)
Chloride: 101 mmol/L (ref 101–111)
Glucose, Bld: 124 mg/dL — ABNORMAL HIGH (ref 65–99)
Potassium: 3.5 mmol/L (ref 3.5–5.1)
SODIUM: 138 mmol/L (ref 135–145)

## 2017-02-15 LAB — GLUCOSE, CAPILLARY
GLUCOSE-CAPILLARY: 157 mg/dL — AB (ref 65–99)
Glucose-Capillary: 120 mg/dL — ABNORMAL HIGH (ref 65–99)
Glucose-Capillary: 180 mg/dL — ABNORMAL HIGH (ref 65–99)
Glucose-Capillary: 185 mg/dL — ABNORMAL HIGH (ref 65–99)

## 2017-02-15 LAB — CBC
HCT: 35.2 % — ABNORMAL LOW (ref 36.0–46.0)
Hemoglobin: 11.4 g/dL — ABNORMAL LOW (ref 12.0–15.0)
MCH: 32.2 pg (ref 26.0–34.0)
MCHC: 32.4 g/dL (ref 30.0–36.0)
MCV: 99.4 fL (ref 78.0–100.0)
PLATELETS: 212 10*3/uL (ref 150–400)
RBC: 3.54 MIL/uL — ABNORMAL LOW (ref 3.87–5.11)
RDW: 15 % (ref 11.5–15.5)
WBC: 10.3 10*3/uL (ref 4.0–10.5)

## 2017-02-15 MED ORDER — SODIUM CHLORIDE 0.9 % IV SOLN
INTRAVENOUS | Status: DC
  Administered 2017-02-15 (×2): via INTRAVENOUS

## 2017-02-15 MED ORDER — DILTIAZEM HCL 30 MG PO TABS
30.0000 mg | ORAL_TABLET | Freq: Three times a day (TID) | ORAL | Status: DC
  Administered 2017-02-15 – 2017-02-17 (×7): 30 mg via ORAL
  Filled 2017-02-15 (×7): qty 1

## 2017-02-15 NOTE — Progress Notes (Signed)
STROKE TEAM PROGRESS NOTE   SUBJECTIVE (INTERVAL HISTORY) RN at bedside, no family at bedside. Pt awake alert today, afebrile. LUE venous doppler negative. Repeat CT showed evolving ICH, no hydrocephalus.     OBJECTIVE Temp:  [97 F (36.1 C)-99.2 F (37.3 C)] 98.4 F (36.9 C) (05/22 0800) Pulse Rate:  [33-123] 56 (05/22 0900) Cardiac Rhythm: Atrial fibrillation (05/22 0800) Resp:  [18-35] 20 (05/22 0900) BP: (106-166)/(47-101) 150/69 (05/22 0900) SpO2:  [92 %-100 %] 92 % (05/22 0900)  CBC:   Recent Labs Lab 02/10/17 2120  02/13/17 0918 02/14/17 0848 02/15/17 0928  WBC 8.4  --  15.1* 12.8* 10.3  NEUTROABS 5.1  --  12.7*  --   --   HGB 13.3  < > 12.3 11.4* 11.4*  HCT 40.6  < > 37.2 34.6* 35.2*  MCV 98.3  --  97.6 97.7 99.4  PLT 236  --  203 167 212  < > = values in this interval not displayed.  Basic Metabolic Panel:   Recent Labs Lab 02/14/17 0848 02/15/17 0928  NA 139 138  K 3.0* 3.5  CL 102 101  CO2 28 29  GLUCOSE 106* 124*  BUN 12 16  CREATININE 0.63 0.62  CALCIUM 8.4* 8.6*    Lipid Panel:     Component Value Date/Time   CHOL 197 01/20/2017 0416   TRIG 66 01/20/2017 0416   HDL 40 (L) 01/20/2017 0416   CHOLHDL 4.9 01/20/2017 0416   VLDL 13 01/20/2017 0416   LDLCALC 144 (H) 01/20/2017 0416   HgbA1c:  Lab Results  Component Value Date   HGBA1C 6.1 (H) 01/20/2017   Urine Drug Screen:     Component Value Date/Time   LABOPIA NONE DETECTED 01/19/2017 1431   COCAINSCRNUR NONE DETECTED 01/19/2017 1431   LABBENZ NONE DETECTED 01/19/2017 1431   AMPHETMU NONE DETECTED 01/19/2017 1431   THCU NONE DETECTED 01/19/2017 1431   LABBARB NONE DETECTED 01/19/2017 1431    Alcohol Level     Component Value Date/Time   ETH <5 01/19/2017 1646    IMAGING I have personally reviewed the radiological images below and agree with the radiology interpretations.  Ct Head Code Stroke W/o Cm 02/10/2017 1. Acute 3.4 x 3.1 cm RIGHT thalamic hemorrhage with  intraventricular extension, RIGHT occipital horn entrapment. 4 mm RIGHT to LEFT midline shift.  2. 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  3. ASPECTS is 10.    CTA Head and Neck 02/11/2017 No enlargement of the right thalamic intraparenchymal hematoma. Ventricular size remains stable. Aortic atherosclerosis without aneurysm or dissection. 30% stenosis of the left common carotid artery origin.  40% stenosis of the left subclavian artery origin. Severe atherosclerotic disease in the right ICA bulb. Serial stenoses 80% or greater. Severe atherosclerotic disease in the left ICA bulb. Stenosis as severe as 80%. Circumferential calcification in the carotid siphon regions with stenosis estimated at 50% bilaterally. 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 Severe right vertebral artery origin stenosis, 80% or greater. Estimated 50% stenosis of the left vertebral artery origin.  Focal stenoses along the cervical course of both vertebral arteries up to 50%.  Stenoses of the vertebral arteries at the foramen magnum level, 70% on the right and 50% on the left. No basilar stenosis. No sign of vascular lesion to explain the right the right thalamic hemorrhage.  MRI  01/20/17  1. Small acute right PCA infarcts in the right thalamus, medial right temporal lobe, and right occipital lobe. 2. Incomplete motion degraded study. Only T1 and diffusion sequences were acquired before the patient terminated exam.  CUS  01/20/17  Severe atherosclerotic disease involving bilateral carotid arteries. No flow identified in the proximal and mid right internal carotid artery but difficult to evaluate due to shadowing plaque. Findings are concerning for a critical stenosis or segmental occlusion in the proximal right internal carotid artery. High-grade stenosis in the  left internal carotid artery measuring greater than 70%. Difficult to assess the disease in the left internal carotid artery due to extensive shadowing plaque. Carotid arteries could be better characterized with MRA or CTA of the neck if needed. Patent vertebral arteries.  TTE  01/19/17 - Left ventricle: The cavity size was normal. There was mild   concentric hypertrophy. Systolic function was normal. The   estimated ejection fraction was in the range of 55% to 60%. Wall   motion was normal; there were no regional wall motion   abnormalities. The study was not technically sufficient to allow   evaluation of LV diastolic dysfunction due to atrial   fibrillation. - Aortic valve: There was moderate stenosis. There was trivial   regurgitation. Mean gradient (S): 15 mm Hg. Valve area (VTI):   1.39 cm^2. - Mitral valve: Calcified annulus. Mildly thickened leaflets .   There was moderate regurgitation. - Left atrium: The atrium was severely dilated. - Right atrium: The atrium was moderately dilated. - Pulmonic valve: There was moderate regurgitation. - Pulmonary arteries: Systolic pressure was moderately increased.   PA peak pressure: 50 mm Hg (S).  Dg Chest Port 1 View 02/13/2017 IMPRESSION: Cardiomegaly, vascular congestion.  02/14/2017 IMPRESSION: 1. Cardiomegaly. Improvement pulmonary vascular congestion. Small bilateral pleural effusions.  2. Low lung volumes.   Head CT  02/15/2017 3.4 x 3.3 x 3.9 cm thalamic hemorrhage with mild surrounding vasogenic edema causing mild mass effect upon the right lateral aspect of third ventricle (4 mm of midline shift to left) with breakthrough of hemorrhage into the right lateral ventricle and tiny amount of blood within the left lateral ventricle appears relatively similar to the prior exam. Tiny amount of blood within sulcus of right cerebellum may represent subarachnoid blood secondary to the interventricular blood. Otherwise no evidence of new  intracranial hemorrhage. Ventricular size stable. Chronic microvascular changes.  LUE venous doppler  02/14/2017 No evidence of deep vein or superficial thrombosis involving the left upper extremity and right subclavian vein.    PHYSICAL EXAM  Temp:  [97 F (36.1 C)-99.2 F (37.3 C)] 98.4 F (36.9 C) (05/22 0800) Pulse Rate:  [33-123] 56 (05/22 0900) Resp:  [18-35] 20 (05/22 0900) BP: (106-166)/(47-101) 150/69 (05/22 0900) SpO2:  [92 %-100 %] 92 % (05/22 0900)  General - Well nourished, well developed, not in acute distress but febrile.  Ophthalmologic - Fundi not visualized due to noncooperation.  Cardiovascular - irregularly irregular heart rate and rhythm with tachycardia.  Neuro - awake alert, afebrile. Following simple commands, both central and peripheral commands, orientated to self, age and place but not to time. Severe dysarthria. PERRL, eyes right gaze preference, left neglect, not cross midline, not blinking to visual threat on the left. Left facial droop. Left UE 0/5, LLE BKA but 2+/5 proximal on pain stimulation. LUE swollen and warm at hand and shoulder and distal forearm. RUE spontaneous movement against gravity, and withdraw to pain. RUE intact FTN. Sensation not cooperative and gait  not tested.   ASSESSMENT/PLAN Ms. Nolon NationsHelmi Weisner is a 83 y.o. female with history of hypertension, peripheral vascular disease, atrial fibrillation on anticoagulation, diabetes mellitus, CHF and recent right CVA with left-sided weakness, presenting with recurrent left side weakness with facial droop, slurred speech in setting of extreme hypertension.  CT showed a R thalamic hemorrhage.   Stroke:   R thalamic hemorrhage with IVH secondary to hypertensive emergency and on anticoagulation s/p [facility]  Resultant  Lethargy, right gaze, left neglect, left hemiplegia  Code Stroke CT R thalamic hmg w/ IVH,  R occipital horn entrapment w/ 4mm R to L shift. 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 6 mm right ACA aneurysm.  Carotid Doppler  12/2016 B ICA stenosis, R ICA critical stenosis/occlusion, L ICA >70%  2D Echo 12/2016 EF 55-60%. No source of embolus   LDL 144  HgbA1c 6.1  SCDs for VTE prophylaxis  DIET - DYS 1 Room service appropriate? Yes; Fluid consistency: Nectar Thick.  Eliquis (apixaban) daily prior to admission, now on no antithrombotics.  Ongoing aggressive stroke risk factor management  Therapy recommendations:  SNF  Disposition:  pending  (lives w/ son and DIL)  Hypertensive Emergency  BP 222/144 on arrival  on cleviprex intermittently  Increase metoprolol to 75mg  bid  Off cleviprex as able  SBP goal 130-180 due to b/l ICA stenosis  Avoid hypotension given critical stenosis  Atrial Fibrillation with RVR  Home anticoagulation:  Eliquis (apixaban) daily   Not an AC d/t hmg  HR 100-120s  Increase metoprolol to 100mg  bid  Bilateral carotid stenosis  01/20/17 CUS right ICA occlusion and left ICA 70% stenosis  CTA head and neck - b/l ICA proximal 80% stenosis  Avoid hypotension - 150/69 Tuesday AM   BP goal 130-180   Not a candidate for intervention at this time  Hx stroke/TIA  12/2016 - R PCA scattered infarcts with atrial fibrillation while on eliquis. Found to have B ICA stenosis, R ICA critical stenosis/occlusion, L ICA >70%. EF 55-60% - continued on eliquis  LUE swollen  LUE warm and swollen  Venous doppler - negative for DVT  LUE elevation  May consider gout empiric treatment as pt does have hx of gout  Left shoulder X-ray - no fracture or dislocation  Febrile - likely due to aspiration / dysphagia  Tmax 100.5 -> 97.7 -> 98.4 axillary  WBC 8.4 -> 15.1 -> 12.8 -> 10.3  Dysphagia - on nectar thick liquid  Blood culture - NGTD  UA WBC 6-30  CXR repeat - pending  tomorrow  CHF  Wheezing improved  CXR vascular congestion improved  Lasix 40mg  02/14/17  Gentle IVF @ 25cc  Encourage PO intake  Hyperlipidemia  Home meds:  lipitor 10  LDL 144, goal < 70  Resumed lipitor this admission  continue statin at discharge  Diabetes type II  HgbA1c 6.1, at goal < 7.0  Controlled  SSI  CBG monitoring  Other Stroke Risk Factors  Advanced age  Former Cigarette smoker  Hx CHF per record - EF 55-60%  PVD  Hypokalemia - 3.1 (on lasix 40 mg daily) -> supplement further and monitor -> 3.5  Hospital day # 5  This patient is critically ill due to right ICH with IVH, b/l carotid stenosis, recent stroke, advanced age and at significant risk of  neurological worsening, death form recurrent bleeding, ischemic stroke, seizure, heart failure. This patient's care requires constant monitoring of vital signs, hemodynamics, respiratory and cardiac monitoring, review of multiple databases, neurological assessment, discussion with family, other specialists and medical decision making of high complexity. I spent 40 minutes of neurocritical care time in the care of this patient.  Marvel Plan, MD PhD Stroke Neurology 02/15/2017 11:36 AM   To contact Stroke Continuity provider, please refer to [email or web address]. After hours, contact General Neurology

## 2017-02-15 NOTE — Progress Notes (Signed)
  Speech Language Pathology Treatment: Dysphagia;Cognitive-Linquistic  Patient Details Name: [name] MRN: [id number] DOB: 09/04/1921 Today's Date: 02/15/2017 Time: 1410-1446 SLP Time Calculation (min) (ACUTE ONLY): 36 min  Assessment / Plan / Recommendation Clinical Impression  SLP provided skilled observation and feeding assistance during lunch meal, with Mod cues provided for management of left buccal pocketing and less so for left-sided anterior loss. Alternating bites/sips was the most effective strategy to keep her oral cavity clear. Initially she did not show any signs concerning for airway compromise; however, once she got through approximately half of her tray she started to show signs of fatigue. Once she became tired she started to have increasingly prolonged bolus formation, oral holding, and throat clearing. At that point, meal was ended to allow for rest. Would continue with current diet for now. Throughout meal pt also required Max cues to attend to the left side of her body and environment, with her gaze getting to midline and once even past that. Her daughter-in-law present says that dysarthria from prior stroke had completely resolved PTA. Will continue to follow.   HPI HPI: Pt is a 83 yo female who presented with recurrent left sided weakness (pt was admitted in April 2018 for stroke). CT Head showed an acute 3.4 x 3.1 cm RIGHT thalamic hemorrhage with intraventricular extension, RIGHT occipital horn entrapment. 4 mmRIGHT to LEFT midline shift. BSE 01/20/17 showed mild left-sided weakness and cough x1 with thin liquids, recommending Dys 2 diet and nectar thick liquids. PMH includes: recent CVA, HTN, gout, L BKA, DM, CHF, afib      SLP Plan  Continue with current plan of care       Recommendations  Diet recommendations: Dysphagia 1 (puree);Nectar-thick liquid Liquids provided via: Cup;Straw Medication Administration: Crushed with puree Supervision: Staff to assist with  self feeding;Full supervision/cueing for compensatory strategies Compensations: Minimize environmental distractions;Slow rate;Small sips/bites;Lingual sweep for clearance of pocketing;Monitor for anterior loss Postural Changes and/or Swallow Maneuvers: Seated upright 90 degrees;Upright 30-60 min after meal                Oral Care Recommendations: Oral care QID;Oral care before and after PO Follow up Recommendations: Skilled Nursing facility SLP Visit Diagnosis: Dysphagia, oropharyngeal phase (R13.12);Cognitive communication deficit (K44.010(R41.841) Plan: Continue with current plan of care       GO                [name], Kandas Oliveto 02/15/2017, 3:50 PM  [name], M.A. CCC-SLP [phone]

## 2017-02-15 NOTE — Progress Notes (Signed)
Occupational Therapy Treatment Patient Details Name: Tenelle Andreason MRN: [id number] DOB: 1921-05-16 Today's Date: 02/15/2017    History of present illness 83 y.o.femalewith a known history of Atrial fibrillation on anticoagulation with eliquis, DM, CHF, dysrhythmia, HTN, PVD, and previous LLE BKA who presented to ED on 4/25 with Right CVA, D/C to SNF and returned home 5/17 with increased left weakness same day and return to ER with right thalamic hemorrhage   OT comments  Pt tolerated EOB sitting x ~25 mins with max A - periods of min A. She is able to perform simple grooming with min - mod A.   Lt hand remains red and swollen.  Recommend SNF.  Follow Up Recommendations  SNF;Supervision/Assistance - 24 hour    Equipment Recommendations  None recommended by OT    Recommendations for Other Services      Precautions / Restrictions Precautions Precautions: Fall Precaution Comments: L BKA, left hemiplegia, right gaze preference Restrictions Weight Bearing Restrictions: Yes LLE Weight Bearing: Non weight bearing       Mobility Bed Mobility Overal bed mobility: Needs Assistance Bed Mobility: Supine to Sit;Sit to Supine     Supine to sit: Total assist;+2 for physical assistance Sit to supine: Total assist;+2 for physical assistance   General bed mobility comments: Pt attempted to assist with lifting and lowering trunk   Transfers                 General transfer comment: Did not attempt     Balance Overall balance assessment: Needs assistance Sitting-balance support: Feet supported;Single extremity supported Sitting balance-Leahy Scale: Poor Sitting balance - Comments: Pt sat EOB x ~25 mins with max A - periods of min A.  Pt leans to lt and posteriorly as she fatigues  Postural control: Posterior lean;Right lateral lean                                 ADL either performed or assessed with clinical judgement   ADL Overall ADL's : Needs  assistance/impaired Eating/Feeding: Total assistance;Sitting   Grooming: Wash/dry face;Brushing hair;Moderate assistance;Sitting Grooming Details (indicate cue type and reason): sitting EOB.  Required assist for thoroughness and sitting balance                                      Vision   Additional Comments: Pt with Rt gaze preference.  She occasionally closes Rt eye.  Pt will turn and look to left with mod cues.  She will look to midline to spontaneously locate items.    Perception     Praxis      Cognition Arousal/Alertness: Awake/alert;Lethargic Behavior During Therapy: Flat affect Overall Cognitive Status: Impaired/Different from baseline Area of Impairment: Orientation;Attention;Following commands;Problem solving;Awareness                 Orientation Level: Disoriented to;Time;Place Current Attention Level: Sustained   Following Commands: Follows one step commands consistently;Follows one step commands with increased time Safety/Judgement: Decreased awareness of deficits;Decreased awareness of safety Awareness: Intellectual Problem Solving: Slow processing;Decreased initiation;Requires verbal cues;Requires tactile cues General Comments: pt requires 15+ seconds to initiate activity and follow commands         Exercises     Shoulder Instructions       General Comments daughter present.  Lt hand with redeness at joints and edema.  Pt denies  pain, but also indicates that she is unable to feel Lt hand     Pertinent Vitals/ Pain       Pain Assessment: Faces Faces Pain Scale: No hurt  Home Living                                          Prior Functioning/Environment              Frequency  Min 2X/week        Progress Toward Goals  OT Goals(current goals can now be found in the care plan section)  Progress towards OT goals: Progressing toward goals     Plan Discharge plan remains appropriate     Co-evaluation    PT/OT/SLP Co-Evaluation/Treatment: Yes Reason for Co-Treatment: Complexity of the patient's impairments (multi-system involvement);For patient/therapist safety   OT goals addressed during session: ADL's and self-care      AM-PAC PT "6 Clicks" Daily Activity     Outcome Measure   Help from another person eating meals?: Total Help from another person taking care of personal grooming?: A Lot Help from another person toileting, which includes using toliet, bedpan, or urinal?: Total Help from another person bathing (including washing, rinsing, drying)?: Total Help from another person to put on and taking off regular upper body clothing?: Total Help from another person to put on and taking off regular lower body clothing?: Total 6 Click Score: 7    End of Session    OT Visit Diagnosis: Hemiplegia and hemiparesis Hemiplegia - Right/Left: Left Hemiplegia - dominant/non-dominant: Non-Dominant Hemiplegia - caused by: Other cerebrovascular disease   Activity Tolerance Patient limited by lethargy   Patient Left in bed;with call bell/phone within reach;with family/visitor present   Nurse Communication Mobility status        Time: [id number] OT Time Calculation (min): 27 min  Charges: OT General Charges $OT Visit: 1 Procedure OT Treatments $Therapeutic Activity: 8-22 mins  Reynolds AmericanWendi Honor Fairbank, OTR/L [phone]    Jeani HawkingConarpe, Gladine Plude M 02/15/2017, 2:08 PM

## 2017-02-15 NOTE — Progress Notes (Signed)
Physical Therapy Treatment Patient Details Name: [name] NationsHelmi Westbrooks MRN: [id number] DOB: 03/23/1921 Today's Date: 02/15/2017    History of Present Illness 83 y.o.femalewith a known history of Atrial fibrillation on anticoagulation with eliquis, DM, CHF, dysrhythmia, HTN, PVD, and previous LLE BKA who presented to ED on 4/25 with Right CVA, D/C to SNF and returned home 5/17 with increased left weakness same day and return to ER with right thalamic hemorrhage    PT Comments    Patient progressing slowly towards PT goals. Pt with flat affect and very slow to respond to questions or follow commands (~15 sec or more). Tolerated EOB activities working on ADL tasks and sitting balance/posture. Fatigues quickly. Pt with decreased attention and awareness. Able to gaze to midline and toward left with cues. Appropriate for SNF. Will follow.    Follow Up Recommendations  SNF;Supervision/Assistance - 24 hour     Equipment Recommendations  None recommended by PT    Recommendations for Other Services       Precautions / Restrictions Precautions Precautions: Fall Precaution Comments: L BKA, left hemiplegia, right gaze preference Restrictions Weight Bearing Restrictions: Yes LLE Weight Bearing: Non weight bearing    Mobility  Bed Mobility Overal bed mobility: Needs Assistance Bed Mobility: Supine to Sit;Sit to Supine     Supine to sit: Total assist;+2 for physical assistance Sit to supine: Total assist;+2 for physical assistance   General bed mobility comments: Pt attempted to assist with lifting and lowering trunk   Transfers                 General transfer comment: Did not attempt   Ambulation/Gait                 Stairs            Wheelchair Mobility    Modified Rankin (Stroke Patients Only) Modified Rankin (Stroke Patients Only) Pre-Morbid Rankin Score: Moderate disability Modified Rankin: Severe disability     Balance Overall balance assessment: Needs  assistance Sitting-balance support: Feet supported;Single extremity supported Sitting balance-Leahy Scale: Poor Sitting balance - Comments: Pt sat EOB x ~25 mins with max A - periods of min A.  Pt leans to left and posteriorly as she fatigues. Worked on sitting balance and upright posture. Postural control: Posterior lean;Right lateral lean                                  Cognition Arousal/Alertness: Awake/alert;Lethargic Behavior During Therapy: Flat affect Overall Cognitive Status: Impaired/Different from baseline Area of Impairment: Orientation;Attention;Following commands;Problem solving;Awareness                 Orientation Level: Disoriented to;Time;Place Current Attention Level: Sustained   Following Commands: Follows one step commands consistently;Follows one step commands with increased time Safety/Judgement: Decreased awareness of deficits;Decreased awareness of safety Awareness: Intellectual Problem Solving: Slow processing;Decreased initiation;Requires verbal cues;Requires tactile cues General Comments: pt requires 15+ seconds to initiate activity and follow commands       Exercises      General Comments General comments (skin integrity, edema, etc.): Daughter present during session. Left hand redness and edema in MCP joints.      Pertinent Vitals/Pain Pain Assessment: Faces Faces Pain Scale: No hurt    Home Living                      Prior Function  PT Goals (current goals can now be found in the care plan section) Progress towards PT goals: Progressing toward goals    Frequency    Min 3X/week      PT Plan Current plan remains appropriate    Co-evaluation PT/OT/SLP Co-Evaluation/Treatment: Yes Reason for Co-Treatment: Complexity of the patient's impairments (multi-system involvement);For patient/therapist safety PT goals addressed during session: Mobility/safety with mobility OT goals addressed during  session: ADL's and self-care      AM-PAC PT "6 Clicks" Daily Activity  Outcome Measure  Difficulty turning over in bed (including adjusting bedclothes, sheets and blankets)?: Total Difficulty moving from lying on back to sitting on the side of the bed? : Total Difficulty sitting down on and standing up from a chair with arms (e.g., wheelchair, bedside commode, etc,.)?: Total Help needed moving to and from a bed to chair (including a wheelchair)?: Total Help needed walking in hospital room?: Total Help needed climbing 3-5 steps with a railing? : Total 6 Click Score: 6    End of Session   Activity Tolerance: Patient limited by fatigue Patient left: in bed;with call bell/phone within reach;with family/visitor present;with SCD's reapplied Nurse Communication: Mobility status;Need for lift equipment PT Visit Diagnosis: Muscle weakness (generalized) (M62.81);Other abnormalities of gait and mobility (R26.89);Hemiplegia and hemiparesis Hemiplegia - Right/Left: Left Hemiplegia - dominant/non-dominant: Non-dominant Hemiplegia - caused by: Nontraumatic intracerebral hemorrhage     Time: 1319-1346 PT Time Calculation (min) (ACUTE ONLY): 27 min  Charges:  $Therapeutic Activity: 8-22 mins                    G Codes:       Mylo Red, PT, DPT [phone]     Blake Divine A Amybeth Sieg 02/15/2017, 2:14 PM

## 2017-02-16 ENCOUNTER — Inpatient Hospital Stay (HOSPITAL_COMMUNITY): Payer: Medicare Other

## 2017-02-16 DIAGNOSIS — I5021 Acute systolic (congestive) heart failure: Secondary | ICD-10-CM

## 2017-02-16 LAB — BASIC METABOLIC PANEL
ANION GAP: 9 (ref 5–15)
BUN: 19 mg/dL (ref 6–20)
CHLORIDE: 104 mmol/L (ref 101–111)
CO2: 27 mmol/L (ref 22–32)
Calcium: 8.6 mg/dL — ABNORMAL LOW (ref 8.9–10.3)
Creatinine, Ser: 0.66 mg/dL (ref 0.44–1.00)
GFR calc non Af Amer: >60 mL/min (ref >60)
GLUCOSE: 137 mg/dL — AB (ref 65–99)
Potassium: 3.7 mmol/L (ref 3.5–5.1)
Sodium: 140 mmol/L (ref 135–145)

## 2017-02-16 LAB — CBC
HEMATOCRIT: 32.1 % — AB (ref 36.0–46.0)
Hemoglobin: 10.5 g/dL — ABNORMAL LOW (ref 12.0–15.0)
MCH: 32.4 pg (ref 26.0–34.0)
MCHC: 32.7 g/dL (ref 30.0–36.0)
MCV: 99.1 fL (ref 78.0–100.0)
Platelets: 231 10*3/uL (ref 150–400)
RBC: 3.24 MIL/uL — ABNORMAL LOW (ref 3.87–5.11)
RDW: 15 % (ref 11.5–15.5)
WBC: 9.3 10*3/uL (ref 4.0–10.5)

## 2017-02-16 LAB — GLUCOSE, CAPILLARY
GLUCOSE-CAPILLARY: 136 mg/dL — AB (ref 65–99)
GLUCOSE-CAPILLARY: 121 mg/dL — AB (ref 65–99)
GLUCOSE-CAPILLARY: 152 mg/dL — AB (ref 65–99)
GLUCOSE-CAPILLARY: 119 mg/dL — AB (ref 65–99)

## 2017-02-16 MED ORDER — HYDRALAZINE HCL 20 MG/ML IJ SOLN: 10.0000 mg | INTRAMUSCULAR | Status: DC | PRN

## 2017-02-16 MED ORDER — METOPROLOL TARTRATE 50 MG PO TABS
50.0000 mg | ORAL_TABLET | Freq: Two times a day (BID) | ORAL | Status: DC
  Administered 2017-02-16 – 2017-02-17 (×2): 50 mg via ORAL
  Filled 2017-02-16 (×2): qty 1

## 2017-02-16 NOTE — Care Management Important Message (Signed)
Important Message  Patient Details  Name: Nolon NationsHelmi Wambolt MRN: [id number] Date of Birth: 02/05/1921   Medicare Important Message Given:  Yes    Gustie Bobb 02/16/2017, 11:07 AM

## 2017-02-16 NOTE — Progress Notes (Signed)
STROKE TEAM PROGRESS NOTE   SUBJECTIVE (INTERVAL HISTORY) RN at bedside, no family at bedside. Pt lethargic but afebrile. LUE venous doppler negative. did not eat breakfast much but able to eat 80% of her lunch.      OBJECTIVE Temp:  [97.6 F (36.4 C)-99.5 F (37.5 C)] 97.6 F (36.4 C) (05/23 0519) Pulse Rate:  [45-109] 68 (05/23 0519) Cardiac Rhythm: Atrial fibrillation (05/22 2100) Resp:  [18-24] 18 (05/23 0519) BP: (127-178)/(66-112) 156/88 (05/23 0519) SpO2:  [92 %-97 %] 97 % (05/23 0519)  CBC:   Recent Labs Lab 02/10/17 2120  02/13/17 0918  02/15/17 0928 02/16/17 0701  WBC 8.4  --  15.1*  < > 10.3 9.3  NEUTROABS 5.1  --  12.7*  --   --   --   HGB 13.3  < > 12.3  < > 11.4* 10.5*  HCT 40.6  < > 37.2  < > 35.2* 32.1*  MCV 98.3  --  97.6  < > 99.4 99.1  PLT 236  --  203  < > 212 231  < > = values in this interval not displayed.  Basic Metabolic Panel:   Recent Labs Lab 02/14/17 0848 02/15/17 0928  NA 139 138  K 3.0* 3.5  CL 102 101  CO2 28 29  GLUCOSE 106* 124*  BUN 12 16  CREATININE 0.63 0.62  CALCIUM 8.4* 8.6*    Lipid Panel:     Component Value Date/Time   CHOL 197 01/20/2017 0416   TRIG 66 01/20/2017 0416   HDL 40 (L) 01/20/2017 0416   CHOLHDL 4.9 01/20/2017 0416   VLDL 13 01/20/2017 0416   LDLCALC 144 (H) 01/20/2017 0416   HgbA1c:  Lab Results  Component Value Date   HGBA1C 6.1 (H) 01/20/2017   Urine Drug Screen:     Component Value Date/Time   LABOPIA NONE DETECTED 01/19/2017 1431   COCAINSCRNUR NONE DETECTED 01/19/2017 1431   LABBENZ NONE DETECTED 01/19/2017 1431   AMPHETMU NONE DETECTED 01/19/2017 1431   THCU NONE DETECTED 01/19/2017 1431   LABBARB NONE DETECTED 01/19/2017 1431    Alcohol Level     Component Value Date/Time   ETH <5 01/19/2017 1646    IMAGING I have personally reviewed the radiological images below and agree with the radiology interpretations.  Ct Head Code Stroke W/o Cm 02/10/2017 1. Acute 3.4 x 3.1 cm  RIGHT thalamic hemorrhage with intraventricular extension, RIGHT occipital horn entrapment. 4 mm RIGHT to LEFT midline shift.  2. 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  3. ASPECTS is 10.    CTA Head and Neck 02/11/2017 No enlargement of the right thalamic intraparenchymal hematoma. Ventricular size remains stable. Aortic atherosclerosis without aneurysm or dissection. 30% stenosis of the left common carotid artery origin.  40% stenosis of the left subclavian artery origin. Severe atherosclerotic disease in the right ICA bulb. Serial stenoses 80% or greater. Severe atherosclerotic disease in the left ICA bulb. Stenosis as severe as 80%. Circumferential calcification in the carotid siphon regions with stenosis estimated at 50% bilaterally. 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 Severe right vertebral artery origin stenosis, 80% or greater. Estimated 50% stenosis of the left vertebral artery origin.  Focal stenoses along the cervical course of both vertebral arteries up to 50%.  Stenoses of the vertebral arteries at the foramen magnum level, 70% on the right and  50% on the left. No basilar stenosis. No sign of vascular lesion to explain the right the right thalamic hemorrhage.  MRI  01/20/17  1. Small acute right PCA infarcts in the right thalamus, medial right temporal lobe, and right occipital lobe. 2. Incomplete motion degraded study. Only T1 and diffusion sequences were acquired before the patient terminated exam.  CUS  01/20/17  Severe atherosclerotic disease involving bilateral carotid arteries. No flow identified in the proximal and mid right internal carotid artery but difficult to evaluate due to shadowing plaque. Findings are concerning for a critical stenosis or segmental occlusion in the proximal right internal carotid  artery. High-grade stenosis in the left internal carotid artery measuring greater than 70%. Difficult to assess the disease in the left internal carotid artery due to extensive shadowing plaque. Carotid arteries could be better characterized with MRA or CTA of the neck if needed. Patent vertebral arteries.  TTE  01/19/17 - Left ventricle: The cavity size was normal. There was mild   concentric hypertrophy. Systolic function was normal. The   estimated ejection fraction was in the range of 55% to 60%. Wall   motion was normal; there were no regional wall motion   abnormalities. The study was not technically sufficient to allow   evaluation of LV diastolic dysfunction due to atrial   fibrillation. - Aortic valve: There was moderate stenosis. There was trivial   regurgitation. Mean gradient (S): 15 mm Hg. Valve area (VTI):   1.39 cm^2. - Mitral valve: Calcified annulus. Mildly thickened leaflets .   There was moderate regurgitation. - Left atrium: The atrium was severely dilated. - Right atrium: The atrium was moderately dilated. - Pulmonic valve: There was moderate regurgitation. - Pulmonary arteries: Systolic pressure was moderately increased.   PA peak pressure: 50 mm Hg (S).  Dg Chest Port 1 View 02/13/2017 IMPRESSION: Cardiomegaly, vascular congestion.  02/14/2017 IMPRESSION: 1. Cardiomegaly. Improvement pulmonary vascular congestion. Small bilateral pleural effusions.  2. Low lung volumes.   Head CT  02/15/2017 3.4 x 3.3 x 3.9 cm thalamic hemorrhage with mild surrounding vasogenic edema causing mild mass effect upon the right lateral aspect of third ventricle (4 mm of midline shift to left) with breakthrough of hemorrhage into the right lateral ventricle and tiny amount of blood within the left lateral ventricle appears relatively similar to the prior exam. Tiny amount of blood within sulcus of right cerebellum may represent subarachnoid blood secondary to the interventricular  blood. Otherwise no evidence of new intracranial hemorrhage. Ventricular size stable. Chronic microvascular changes.  LUE venous doppler  02/14/2017 No evidence of deep vein or superficial thrombosis involving the left upper extremity and right subclavian vein.    PHYSICAL EXAM  Temp:  [97.6 F (36.4 C)-99.5 F (37.5 C)] 97.6 F (36.4 C) (05/23 0519) Pulse Rate:  [45-109] 68 (05/23 0519) Resp:  [18-24] 18 (05/23 0519) BP: (127-178)/(66-112) 156/88 (05/23 0519) SpO2:  [92 %-97 %] 97 % (05/23 0519)  General - Well nourished, well developed, not in acute distress but febrile.  Ophthalmologic - Fundi not visualized due to noncooperation.  Cardiovascular - irregularly irregular heart rate and rhythm with tachycardia.  Neuro - awake alert, afebrile. Following simple commands, both central and peripheral commands, orientated to self, age and place but not to time. Severe dysarthria. PERRL, eyes right gaze preference, left neglect, not cross midline, not blinking to visual threat on the left. Left facial droop. Left UE 0/5, LLE BKA but 2+/5 proximal on pain stimulation. LUE swollen and  warm at hand and shoulder and distal forearm, but seems improved over the last several days. RUE spontaneous movement against gravity, and withdraw to pain. RUE intact FTN. Sensation not cooperative and gait not tested.   ASSESSMENT/PLAN [name] is a 83 y.o. female with history of hypertension, peripheral vascular disease, atrial fibrillation on anticoagulation, diabetes mellitus, CHF and recent right CVA with left-sided weakness, presenting with recurrent left side weakness with facial droop, slurred speech in setting of extreme hypertension.  CT showed a R thalamic hemorrhage.   Stroke:   R thalamic hemorrhage with IVH secondary to hypertensive emergency and on anticoagulation s/p K[facility]  Resultant  Lethargy, right gaze, left neglect, left hemiplegia  Code Stroke CT R thalamic hmg w/  IVH,  R occipital horn entrapment w/ 4mm R to L shift. 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 6 mm right ACA aneurysm.  Carotid Doppler  12/2016 B ICA stenosis, R ICA critical stenosis/occlusion, L ICA >70%  2D Echo 12/2016 EF 55-60%. No source of embolus   LDL 144  HgbA1c 6.1  SCDs for VTE prophylaxis  DIET - DYS 1 Room service appropriate? Yes; Fluid consistency: Nectar Thick.  Eliquis (apixaban) daily prior to admission, now on no antithrombotics.  Ongoing aggressive stroke risk factor management  Therapy recommendations:  SNF  Disposition:  pending  (lives w/ son and DIL)  Hypertensive Emergency  BP 222/144 on arrival  on cleviprex intermittently  Increase metoprolol to 75mg  bid  Off cleviprex as able  SBP goal 130-180 due to b/l ICA stenosis  Avoid hypotension given critical stenosis  Atrial Fibrillation with RVR  Home anticoagulation:  Eliquis (apixaban) daily   Not an AC d/t hmg  HR goal < 110  Increase metoprolol to 100mg  bid  Add cardizem 30mg    Bilateral carotid stenosis  01/20/17 CUS right ICA occlusion and left ICA 70% stenosis  CTA head and neck - b/l ICA proximal 80% stenosis  Avoid hypotension  BP goal 130-180   Not a candidate for intervention at this time  Hx stroke/TIA  12/2016 - R PCA scattered infarcts with atrial fibrillation while on eliquis. Found to have B ICA stenosis, R ICA critical stenosis/occlusion, L ICA >70%. EF 55-60% - continued on eliquis  LUE swollen  LUE warm and swollen - improved  Venous doppler - negative for DVT  LUE elevation  Pt allergic to prednisone  Left shoulder X-ray - no fracture or dislocation  Febrile - likely due to aspiration / dysphagia, now resolved  Tmax 100.5 -> 97.7 -> 98.4 axillary  WBC 8.4 -> 15.1 -> 12.8 -> 10.3->9.3  Dysphagia - on  nectar thick liquid  Blood culture - NGTD  UA WBC 6-30  CXR repeat - persistent mild pulmonary edema  CHF  Wheezing improved  CXR vascular congestion improved  Lasix 40mg  02/14/17  Off IVF  Encourage PO intake - ate lunch 80%  Hyperlipidemia  Home meds:  lipitor 10  LDL 144, goal < 70  Resumed lipitor this admission  continue statin at discharge  Diabetes type II  HgbA1c 6.1, at goal < 7.0  Controlled  SSI  CBG monitoring  Other Stroke Risk Factors  Advanced age  Former Cigarette smoker  Hx CHF per record - EF 55-60%  PVD  Hypokalemia - 3.1 (on lasix 40 mg daily) ->  supplement further and monitor -> 3.5  Hospital day # 6  The patient is with advance age, lethargic with ICH, afib RVR, at risk for recurrent strokes and TIAs and neurological worsening and she needs ongoing stroke evaluation and aggressive risk factor modification.   Marvel Plan, MD PhD Stroke Neurology 02/16/2017 2:55 PM     To contact Stroke Continuity provider, please refer to [email or web address]. After hours, contact General Neurology

## 2017-02-16 NOTE — Progress Notes (Signed)
CCMD notified RN that patient's heart rate is brady in the 30s-40s. Patient's HR currently 45.VSS, neuro assessment unchanged.  Dr. Roda ShuttersXu paged. See new orders.

## 2017-02-16 NOTE — Clinical Social Work Note (Signed)
Clinical Social Work Assessment  Patient Details  Name: [name] MRN: [id number] Date of Birth: 03/02/1921  Date of referral:  02/16/17               Reason for consult:  Facility Placement, Discharge Planning                Permission sought to share information with:  Facility Medical sales representativeContact Representative, Family Supports Permission granted to share information::  Yes, Verbal Permission Granted  Name::     [name], [name]  Agency::  SNF  Relationship::  Daughter-in-law, Son  SolicitorContact Information:     Housing/Transportation Living arrangements for the past 2 months:  Single Family Home Source of Information:  Adult Children Patient Interpreter Needed:  None Criminal Activity/Legal Involvement Pertinent to Current Situation/Hospitalization:  No - Comment as needed Significant Relationships:  Adult Children Lives with:  Self, Adult Children Do you feel safe going back to the place where you live?  Yes Need for family participation in patient care:  Yes (Comment) (pt not fully oriented)  Care giving concerns:  Prior to admission, pt had returned home with her son and daughter-in-law from SNF [facility] and was doing better from a previous stroke. Pt had another stroke upon returning home, and now requires skilled care again to recover and gain back some independence before returning home.   Social Worker assessment / plan:  CSW explained recommendation for SNF. Pt's daughter in law agreed with recommendation and requested placement at [facility]. Pt's daughter in law described how the previous placement was successful and the pt had returned home after the rehab stay at her baseline level of functioning.   Employment status:  Retired Health and safety inspectornsurance information:  Medicare PT Recommendations:  Skilled Nursing Facility Information / Referral to community resources:  Skilled Nursing Facility  Patient/Family's Response to care:  Pt's response unable to be assessed; pt's daughter in law  agreeable to SNF placement.  Patient/Family's Understanding of and Emotional Response to Diagnosis, Current Treatment, and Prognosis:  Pt's emotional understanding unable to be assessed. Pt's daughter in law appeared hopeful that the pt would be able to recover with a short rehab stay and be able to return home.  Emotional Assessment Appearance:  Appears stated age Attitude/Demeanor/Rapport:    Affect (typically observed):  Appropriate Orientation:  Oriented to Place, Oriented to Self Alcohol / Substance use:  Not Applicable Psych involvement (Current and /or in the community):  No (Comment)  Discharge Needs  Concerns to be addressed:  Care Coordination, Discharge Planning Concerns Readmission within the last 30 days:  Yes Current discharge risk:  Physical Impairment Barriers to Discharge:  Continued Medical Work up   Dollar GeneralElizabeth M Alayla Dethlefs, LCSW 02/16/2017, 4:17 PM

## 2017-02-16 NOTE — Care Management Note (Signed)
Case Management Note  Patient Details  Name: Nolon NationsHelmi Shadle MRN: [id number] Date of Birth: 01/01/1921  Subjective/Objective:                    Action/Plan: Plan is for SNF when medically ready. CM following.  Expected Discharge Date:                  Expected Discharge Plan:  Skilled Nursing Facility  In-House Referral:  Clinical Social Work  Discharge planning Services  CM Consult  Post Acute Care Choice:    Choice offered to:     DME Arranged:    DME Agency:     HH Arranged:    HH Agency:     Status of Service:  In process, will continue to follow  If discussed at Long Length of Stay Meetings, dates discussed:    Additional Comments:  Kermit BaloKelli F Jordanna Hendrie, RN 02/16/2017, 11:34 AM

## 2017-02-16 NOTE — Plan of Care (Signed)
Problem: Safety: Goal: Ability to remain free from injury will improve Outcome: Progressing No incidence of falls during this admission. Bed alarm on. 3/4 siderails in place. Nonskid footwear being utilized. Clean and clear environment maintained.  Problem: Nutrition: Goal: Dietary intake will improve Outcome: Progressing Patient did well with eating her dinner tonight. She does require assistance to eat and she tends to pocket her food if you allow it. Patient tolerates her diet well and has no problems drinking thickened liquids.

## 2017-02-17 ENCOUNTER — Inpatient Hospital Stay (HOSPITAL_COMMUNITY): Payer: Medicare Other

## 2017-02-17 LAB — BASIC METABOLIC PANEL
ANION GAP: 11 (ref 5–15)
BUN: 17 mg/dL (ref 6–20)
CALCIUM: 8.9 mg/dL (ref 8.9–10.3)
CO2: 25 mmol/L (ref 22–32)
Chloride: 104 mmol/L (ref 101–111)
Creatinine, Ser: 0.65 mg/dL (ref 0.44–1.00)
GFR calc Af Amer: >60 mL/min (ref >60)
GFR calc non Af Amer: >60 mL/min (ref >60)
GLUCOSE: 118 mg/dL — AB (ref 65–99)
Potassium: 4.3 mmol/L (ref 3.5–5.1)
Sodium: 140 mmol/L (ref 135–145)

## 2017-02-17 LAB — CBC
HEMATOCRIT: 35.4 % — AB (ref 36.0–46.0)
Hemoglobin: 11.3 g/dL — ABNORMAL LOW (ref 12.0–15.0)
MCH: 32 pg (ref 26.0–34.0)
MCHC: 31.9 g/dL (ref 30.0–36.0)
MCV: 100.3 fL — AB (ref 78.0–100.0)
Platelets: 274 10*3/uL (ref 150–400)
RBC: 3.53 MIL/uL — ABNORMAL LOW (ref 3.87–5.11)
RDW: 15.2 % (ref 11.5–15.5)
WBC: 9.2 10*3/uL (ref 4.0–10.5)

## 2017-02-17 LAB — GLUCOSE, CAPILLARY
GLUCOSE-CAPILLARY: 180 mg/dL — AB (ref 65–99)
Glucose-Capillary: 141 mg/dL — ABNORMAL HIGH (ref 65–99)
Glucose-Capillary: 145 mg/dL — ABNORMAL HIGH (ref 65–99)

## 2017-02-17 MED ORDER — FUROSEMIDE 40 MG PO TABS
40.0000 mg | ORAL_TABLET | Freq: Every day | ORAL | Status: DC
  Administered 2017-02-17: 40 mg via ORAL
  Filled 2017-02-17: qty 1

## 2017-02-17 MED ORDER — POTASSIUM CHLORIDE 20 MEQ PO PACK: 20.0000 meq | PACK | Freq: Every day | ORAL | 6 refills | Status: AC

## 2017-02-17 MED ORDER — COLCHICINE 0.6 MG PO TABS
1.2000 mg | ORAL_TABLET | Freq: Once | ORAL | Status: AC
  Administered 2017-02-17: 1.2 mg via ORAL
  Filled 2017-02-17: qty 2

## 2017-02-17 MED ORDER — DILTIAZEM HCL 30 MG PO TABS: 30.0000 mg | ORAL_TABLET | Freq: Three times a day (TID) | ORAL | 6 refills | Status: AC

## 2017-02-17 MED ORDER — RESOURCE THICKENUP CLEAR PO POWD: 1.0000 | ORAL | 6 refills | Status: AC | PRN

## 2017-02-17 MED ORDER — ACETAMINOPHEN 325 MG PO TABS: 650.0000 mg | ORAL_TABLET | Freq: Four times a day (QID) | ORAL | Status: AC | PRN

## 2017-02-17 MED ORDER — COLCHICINE 0.6 MG PO TABS
0.6000 mg | ORAL_TABLET | Freq: Once | ORAL | Status: AC
  Administered 2017-02-17: 0.6 mg via ORAL
  Filled 2017-02-17: qty 1

## 2017-02-17 NOTE — Plan of Care (Signed)
Problem: Health Behavior/Discharge Planning: Goal: Ability to manage health-related needs will improve Outcome: Not Met (add Reason) Patient is physically unable to manage ADLs and health related needs. Patient is aware of the need for assistance.  Problem: Nutrition: Goal: Risk of aspiration will decrease Outcome: Progressing Patient currently on a dysphasia 1/nectar thick liquid diet. Patient is aware of pocketing food and liquids in left side of mouth and demonstrates the ability to check and/or clear left side of mouth with tongue following each bite and sip. RN will continue to monitor.

## 2017-02-17 NOTE — Progress Notes (Signed)
  Speech Language Pathology Treatment: Dysphagia  Patient Details Name: [name] MRN: [id number] DOB: 10/03/1920 Today's Date: 02/17/2017 Time: [id number] SLP Time Calculation (min) (ACUTE ONLY): 10 min  Assessment / Plan / Recommendation Clinical Impression  SLP attempted advanced trials of thin liquids with intermittent cough response noted. Although this is concerning for aspiration, MBS indicated silent penetration/aspiration of liquids. This could indicate aspiration of a larger volume or it could mean that her sensation is improving. Note that she may be discharging - will plan on repeating MBS on next date if she is still in house. Otherwise, [facility]   HPI HPI: Pt is a 83 yo female who presented with recurrent left sided weakness (pt was admitted in April 2018 for stroke). CT Head showed an acute 3.4 x 3.1 cm RIGHT thalamic hemorrhage with intraventricular extension, RIGHT occipital horn entrapment. 4 mmRIGHT to LEFT midline shift. BSE 01/20/17 showed mild left-sided weakness and cough x1 with thin liquids, recommending Dys 2 diet and nectar thick liquids. PMH includes: recent CVA, HTN, gout, L BKA, DM, CHF, afib      SLP Plan  Continue with current plan of care       Recommendations  Diet recommendations: Dysphagia 1 (puree);Nectar-thick liquid Liquids provided via: Cup;Straw Medication Administration: Crushed with puree Supervision: Staff to assist with self feeding;Full supervision/cueing for compensatory strategies Compensations: Minimize environmental distractions;Slow rate;Small sips/bites;Lingual sweep for clearance of pocketing;Monitor for anterior loss Postural Changes and/or Swallow Maneuvers: Seated upright 90 degrees;Upright 30-60 min after meal                Oral Care Recommendations: Oral care BID Follow up Recommendations: Skilled Nursing facility SLP Visit Diagnosis: Dysphagia, oropharyngeal phase (R13.12) Plan: Continue  with current plan of care       GO                [name], [name] 02/17/2017, 4:09 PM  [name] HamLaura [name], M.A. CCC-SLP [phone]

## 2017-02-17 NOTE — Progress Notes (Signed)
Report called to [place] at [facility]. Await PTAR.

## 2017-02-17 NOTE — [place] FL2 (Signed)
[place] MEDICAID FL2 LEVEL OF CARE SCREENING TOOL     IDENTIFICATION  Patient Name: [name] Birthdate: 06/12/1921 Sex: female Admission Date (Current Location): 02/10/2017  [facility] [place]   Facility and Address:  The [facility], [address], [place], [place] [address]      Provider Number: [id number]  Attending Physician Name and Address:  Marvel PlanXu, Jindong, MD  Relative Name and Phone Number:       Current Level of Care: Hospital Recommended Level of Care: Skilled Nursing Facility Prior Approval Number:    Date Approved/Denied:   PASRR Number: [id number] [id number]  Discharge Plan: SNF    Current Diagnoses: Patient Active Problem List   Diagnosis Date Noted  . Atrial fibrillation with RVR (HCC) 02/13/2017  . Carotid stenosis, bilateral 02/13/2017  . ICH (intracerebral hemorrhage) (HCC) 02/10/2017  . Acute CVA (cerebrovascular accident) (HCC) 01/19/2017  . Cellulitis 02/03/2016  . PVD (peripheral vascular disease) (HCC) 02/03/2016  . HTN (hypertension) 02/03/2016  . A-fib (HCC) 02/03/2016  . CHF (congestive heart failure) (HCC) 02/03/2016  . DM foot (HCC) 05/27/2015  . Ulcer of foot due to diabetes mellitus (HCC) 05/27/2015  . Diabetic foot ulcer (HCC) 05/27/2015    Orientation RESPIRATION BLADDER Height & Weight     Self, Time, Situation, Place  Normal Incontinent Weight: 171 lb 11.2 oz (77.9 kg) (bed weight) Height:  5\' 6"  (167.6 cm)  BEHAVIORAL SYMPTOMS/MOOD NEUROLOGICAL BOWEL NUTRITION STATUS      Incontinent Diet (thickened liquids)  AMBULATORY STATUS COMMUNICATION OF NEEDS Skin   Extensive Assist Verbally Normal                       Personal Care Assistance Level of Assistance  Bathing, Dressing, Feeding Bathing Assistance: Maximum assistance Feeding assistance: Limited assistance Dressing Assistance: Maximum assistance     Functional Limitations Info             SPECIAL CARE FACTORS FREQUENCY   PT (By licensed PT), OT (By licensed OT)     PT Frequency: 5x/wk OT Frequency: 5x/wk            Contractures      Additional Factors Info  Code Status, Allergies, Insulin Sliding Scale Code Status Info: DNR Allergies Info: Amoxicillin, Cephalexin, Prednisolone, Prednisone, Tape, Toradol Ketorolac Tromethamine, Tramadol   Insulin Sliding Scale Info: 3x/day       Current Medications (02/17/2017):  This is the current hospital active medication list Current Facility-Administered Medications  Medication Dose Route Frequency Provider Last Rate Last Dose  . acetaminophen (TYLENOL) tablet 650 mg  650 mg Oral Q6H PRN Marvel PlanXu, Jindong, MD   650 mg at 02/15/17 1538  . atorvastatin (LIPITOR) tablet 10 mg  10 mg Oral q1800 Marvel PlanXu, Jindong, MD   10 mg at 02/16/17 1714  . chlorhexidine (PERIDEX) 0.12 % solution 15 mL  15 mL Mouth Rinse BID Marvel PlanXu, Jindong, MD   15 mL at 02/16/17 2308  . cholecalciferol (VITAMIN D) tablet 5,000 Units  5,000 Units Oral Daily Marvel PlanXu, Jindong, MD   5,000 Units at 02/16/17 0955  . digoxin (LANOXIN) tablet 0.125 mg  0.125 mg Oral Daily Marvel PlanXu, Jindong, MD   0.125 mg at 02/16/17 0955  . diltiazem (CARDIZEM) tablet 30 mg  30 mg Oral Q8H Marvel PlanXu, Jindong, MD   30 mg at 02/17/17 0528  . ferrous sulfate tablet 325 mg  325 mg Oral Daily Marvel PlanXu, Jindong, MD   325 mg at 02/16/17 0955  .  hydrALAZINE (APRESOLINE) injection 10 mg  10 mg Intravenous Q4H PRN Marvel Plan, MD      . insulin aspart (novoLOG) injection 0-9 Units  0-9 Units Subcutaneous TID WC Marvel Plan, MD   1 Units at 02/17/17 [phone]  . labetalol (NORMODYNE,TRANDATE) injection 10 mg  10 mg Intravenous Q2H PRN Marvel Plan, MD      . MEDLINE mouth rinse  15 mL Mouth Rinse q12n4p Marvel Plan, MD   15 mL at 02/16/17 1714  . metoprolol tartrate (LOPRESSOR) tablet 50 mg  50 mg Oral BID Marvel Plan, MD   50 mg at 02/16/17 2308  . pantoprazole (PROTONIX) EC tablet 40 mg  40 mg Oral Daily Marvel Plan, MD   40 mg at 02/16/17 0955  . potassium chloride  (KLOR-CON) packet 20 mEq  20 mEq Oral BID Rinehuls, David L, PA-C   20 mEq at 02/16/17 0956  . potassium chloride (KLOR-CON) packet 20 mEq  20 mEq Oral Daily Rinehuls, David L, PA-C      . pyridOXINE (VITAMIN B-6) tablet 100 mg  100 mg Oral Daily Marvel Plan, MD   100 mg at 02/16/17 0956  . RESOURCE THICKENUP CLEAR   Oral PRN Marvel Plan, MD      . selenium tablet 200 mcg  200 mcg Oral Daily Marvel Plan, MD   200 mcg at 02/16/17 0956  . senna-docusate (Senokot-S) tablet 1 tablet  1 tablet Oral BID Noel Christmas   1 tablet at 02/16/17 2307  . vitamin B-12 (CYANOCOBALAMIN) tablet 1,000 mcg  1,000 mcg Oral Daily Marvel Plan, MD   1,000 mcg at 02/16/17 0955  . zinc sulfate capsule 220 mg  220 mg Oral Daily Marvel Plan, MD   220 mg at 02/16/17 9604     Discharge Medications: Please see discharge summary for a list of discharge medications.  Relevant Imaging Results:  Relevant Lab Results:   Additional Information SS#: [id number]  Baldemar Lenis, LCSW

## 2017-02-17 NOTE — Discharge Summary (Signed)
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 Active Problems:   ICH (intracerebral hemorrhage) (HCC)   Atrial fibrillation with RVR (HCC)   Carotid stenosis, bilateral     BMI: Body mass index is 27.71 kg/m.  Past Medical History:  Diagnosis Date  . Atrial fibrillation (HCC)   . CHF (congestive heart failure) (HCC)   . Diabetes mellitus without complication (HCC)   . Dysrhythmia   . Foot ulcer, right (HCC)   . Gangrene of toe (HCC)   . Gout   . Hypertension   . Peripheral vascular disease [facility])    Past Surgical History:  Procedure Laterality Date  . EYE SURGERY    . JOINT REPLACEMENT    . LEG AMPUTATION THROUGH KNEE Left   . PERIPHERAL VASCULAR CATHETERIZATION Right 04/08/2015   Procedure: Lower Extremity Angiography;  Surgeon: Renford Dills, MD;  Location: [facility];  Service: Cardiovascular;  Laterality: Right;  . PERIPHERAL VASCULAR CATHETERIZATION  04/08/2015   Procedure: Lower Extremity Intervention;  Surgeon: Renford Dills, MD;  Location: [facility];  Service: Cardiovascular;;  . PERIPHERAL VASCULAR CATHETERIZATION N/A 07/15/2015   Procedure: Abdominal Aortogram w/Lower Extremity;  Surgeon: Renford Dills, MD;  Location: [facility];  Service: Cardiovascular;  Laterality: N/A;  . PERIPHERAL VASCULAR CATHETERIZATION  07/15/2015   Procedure: Lower Extremity Intervention;  Surgeon: Renford Dills, MD;  Location: [facility];  Service: Cardiovascular;;  . PERIPHERAL VASCULAR CATHETERIZATION Right 02/09/2016   Procedure: Lower Extremity Angiography;  Surgeon: Annice Needy, MD;   Location: [facility];  Service: Cardiovascular;  Laterality: Right;  . PERIPHERAL VASCULAR CATHETERIZATION  02/09/2016   Procedure: Lower Extremity Intervention;  Surgeon: Annice Needy, MD;  Location: [facility];  Service: Cardiovascular;;  . Right Knee replacement      Allergies as of 02/17/2017      Reactions   Amoxicillin Other (See Comments)   Per MAR   Cephalexin Other (See Comments)   Per MAR   Prednisolone Other (See Comments)   Unknown   Prednisone Other (See Comments)   Unknown   Tape Other (See Comments)   SKIN IS VERY THIN; PLEASE USE EITHER WRAPPED GAUZE OR COBAN WRAP; SKIN WILL TEAR!!   Toradol [ketorolac Tromethamine] Other (See Comments)   Per MAR   Tramadol Other (See Comments)   Per MAR      Medication List    STOP taking these medications   apixaban 5 MG Tabs tablet Commonly known as:  ELIQUIS   oxyCODONE 5 MG immediate release tablet Commonly known as:  Oxy IR/ROXICODONE   potassium chloride 10 MEQ tablet Commonly known as:  K-DUR Replaced by:  potassium chloride 20 MEQ packet     TAKE these medications   acetaminophen 325 MG tablet Commonly known as:  TYLENOL Take 2 tablets (650 mg total) by mouth every 6 (six) hours as needed for mild pain or fever.   atorvastatin 10 MG tablet Commonly known as:  LIPITOR Take 1 tablet (10 mg total) by mouth daily at 6 PM.   clotrimazole-betamethasone  cream Commonly known as:  LOTRISONE Apply 1 application topically daily.   digoxin 0.125 MG tablet Commonly known as:  LANOXIN Take 0.125 mg by mouth daily.   diltiazem 30 MG tablet Commonly known as:  CARDIZEM Take 1 tablet (30 mg total) by mouth every 8 (eight) hours.   docusate sodium 100 MG capsule Commonly known as:  COLACE Take 100 mg by mouth 2 (two) times daily as needed.   feeding supplement (ENSURE ENLIVE) Liqd Take 237 mLs by mouth 2 (two) times daily between meals.   ferrous sulfate 325 (65 FE) MG tablet Take 325 mg by  mouth daily.   furosemide 40 MG tablet Commonly known as:  LASIX Take 1 tablet (40 mg total) by mouth daily.   metoprolol tartrate 50 MG tablet Commonly known as:  LOPRESSOR Take 1 tablet (50 mg total) by mouth 2 (two) times daily.   nystatin 100000 UNIT/ML suspension Commonly known as:  MYCOSTATIN Use as directed 5 mLs (500,000 Units total) in the mouth or throat 4 (four) times daily.   nystatin powder Commonly known as:  nystatin Apply topically 3 (three) times daily.   potassium chloride 20 MEQ packet Commonly known as:  KLOR-CON Take 20 mEq by mouth daily. Replaces:  potassium chloride 10 MEQ tablet   pyridOXINE 100 MG tablet Commonly known as:  VITAMIN B-6 Take 100 mg by mouth daily.   RESOURCE THICKENUP CLEAR Powd Take 120 g by mouth as needed. Use to thicken liquids as needed   selenium 50 MCG Tabs tablet Take 200 mcg by mouth daily.   vitamin B-12 1000 MCG tablet Commonly known as:  CYANOCOBALAMIN Take 1,000 mcg by mouth daily.   Vitamin D3 5000 units Caps Take 5,000 Units by mouth daily.   zinc gluconate 50 MG tablet Take 50 mg by mouth daily.       LABORATORY STUDIES CBC    Component Value Date/Time   WBC 9.2 02/17/2017 0422   RBC 3.53 (L) 02/17/2017 0422   HGB 11.3 (L) 02/17/2017 0422   HGB 7.3 (L) 01/05/2015 0607   HCT 35.4 (L) 02/17/2017 0422   HCT 23.9 (L) 01/04/2015 0554   PLT 274 02/17/2017 0422   PLT 254 01/04/2015 0554   MCV 100.3 (H) 02/17/2017 0422   MCV 76 (L) 01/04/2015 0554   MCH 32.0 02/17/2017 0422   MCHC 31.9 02/17/2017 0422   RDW 15.2 02/17/2017 0422   RDW 27.4 (H) 01/04/2015 0554   LYMPHSABS 1.2 02/13/2017 0918   LYMPHSABS 2.3 01/04/2015 0554   MONOABS 1.3 (H) 02/13/2017 0918   MONOABS 1.0 (H) 01/04/2015 0554   EOSABS 0.0 02/13/2017 0918   EOSABS 0.1 01/04/2015 0554   BASOSABS 0.0 02/13/2017 0918   BASOSABS 0.1 01/04/2015 0554   CMP    Component Value Date/Time   NA 140 02/17/2017 0422   NA 138 01/05/2015 0607    K 4.3 02/17/2017 0422   K 4.0 01/05/2015 0607   CL 104 02/17/2017 0422   CL 103 01/05/2015 0607   CO2 25 02/17/2017 0422   CO2 31 01/05/2015 0607   GLUCOSE 118 (H) 02/17/2017 0422   GLUCOSE 104 (H) 01/05/2015 0607   BUN 17 02/17/2017 0422   BUN 15 01/05/2015 0607   CREATININE 0.65 02/17/2017 0422   CREATININE 0.63 01/05/2015 0607   CALCIUM 8.9 02/17/2017 0422   CALCIUM 8.0 (L) 01/05/2015 0607   PROT 6.7 02/10/2017 2120   PROT 5.4 (L) 01/05/2015 0607   ALBUMIN 3.2 (L) 02/10/2017 2120  ALBUMIN 2.3 (L) 01/05/2015 0607   AST 27 02/10/2017 2120   AST 22 01/05/2015 0607   ALT 13 (L) 02/10/2017 2120   ALT 10 (L) 01/05/2015 0607   ALKPHOS 73 02/10/2017 2120   ALKPHOS 50 01/05/2015 0607   BILITOT 0.6 02/10/2017 2120   BILITOT 0.3 01/05/2015 0607   GFRNONAA >60 02/17/2017 0422   GFRNONAA >60 01/05/2015 0607   GFRAA >60 02/17/2017 0422   GFRAA >60 01/05/2015 0607   COAGS Lab Results  Component Value Date   INR 1.18 02/10/2017   INR 1.11 01/19/2017   INR 2.03 02/03/2016   Lipid Panel    Component Value Date/Time   CHOL 197 01/20/2017 0416   TRIG 66 01/20/2017 0416   HDL 40 (L) 01/20/2017 0416   CHOLHDL 4.9 01/20/2017 0416   VLDL 13 01/20/2017 0416   LDLCALC 144 (H) 01/20/2017 0416   HgbA1C  Lab Results  Component Value Date   HGBA1C 6.1 (H) 01/20/2017   Urinalysis    Component Value Date/Time   COLORURINE YELLOW 02/13/2017 1100   APPEARANCEUR HAZY (A) 02/13/2017 1100   LABSPEC 1.013 02/13/2017 1100   PHURINE 5.0 02/13/2017 1100   GLUCOSEU NEGATIVE 02/13/2017 1100   HGBUR SMALL (A) 02/13/2017 1100   BILIRUBINUR NEGATIVE 02/13/2017 1100   KETONESUR NEGATIVE 02/13/2017 1100   PROTEINUR NEGATIVE 02/13/2017 1100   NITRITE NEGATIVE 02/13/2017 1100   LEUKOCYTESUR TRACE (A) 02/13/2017 1100   Urine Drug Screen     Component Value Date/Time   LABOPIA NONE DETECTED 01/19/2017 1431   COCAINSCRNUR NONE DETECTED 01/19/2017 1431   LABBENZ NONE DETECTED 01/19/2017 1431    AMPHETMU NONE DETECTED 01/19/2017 1431   THCU NONE DETECTED 01/19/2017 1431   LABBARB NONE DETECTED 01/19/2017 1431    Alcohol Level    Component Value Date/Time   ETH <5 01/19/2017 1646     SIGNIFICANT DIAGNOSTIC STUDIES I have personally reviewed the radiological images below and agree with the radiology interpretations.  Ct Head Code Stroke W/o Cm 02/10/2017 1. Acute 3.4 x 3.1 cm RIGHT thalamic hemorrhage with intraventricular extension, RIGHT occipital horn entrapment. 4 mm RIGHT to LEFT midline shift.  2. 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  3. ASPECTS is 10.   CTA Head and Neck 02/11/2017 No enlargement of the right thalamic intraparenchymal hematoma. Ventricular size remains stable. Aortic atherosclerosis without aneurysm or dissection. 30% stenosis of the left common carotid artery origin.  40% stenosis of the left subclavian artery origin. Severe atherosclerotic disease in the right ICA bulb. Serial stenoses 80% or greater. Severe atherosclerotic disease in the left ICA bulb. Stenosis as severe as 80%. Circumferential calcification in the carotid siphon regions with stenosis estimated at 50% bilaterally. 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 Severe right vertebral artery origin stenosis, 80% or greater. Estimated 50% stenosis of the left vertebral artery origin.  Focal stenoses along the cervical course of both vertebral arteries up to 50%.  Stenoses of the vertebral arteries at the foramen magnum level, 70% on the right and 50% on the left. No basilar stenosis. No sign of vascular lesion to explain the right the right thalamic hemorrhage.  MRI  01/20/17  1. Small acute right PCA infarcts in the right thalamus, medial right temporal lobe, and right occipital lobe. 2. Incomplete motion degraded study. Only T1  and diffusion sequences were acquired before the patient  terminated exam.  CUS  01/20/17  Severe atherosclerotic disease involving bilateral carotid arteries. No flow identified in the proximal and mid right internal carotid artery but difficult to evaluate due to shadowing plaque. Findings are concerning for a critical stenosis or segmental occlusion in the proximal right internal carotid artery. High-grade stenosis in the left internal carotid artery measuring greater than 70%. Difficult to assess the disease in the left internal carotid artery due to extensive shadowing plaque. Carotid arteries could be better characterized with MRA or CTA of the neck if needed. Patent vertebral arteries.  TTE  01/19/17 - Left ventricle: The cavity size was normal. There was mild concentric hypertrophy. Systolic function was normal. The estimated ejection fraction was in the range of 55% to 60%. Wall motion was normal; there were no regional wall motion abnormalities. The study was not technically sufficient to allow evaluation of LV diastolic dysfunction due to atrial fibrillation. - Aortic valve: There was moderate stenosis. There was trivial regurgitation. Mean gradient (S): 15 mm Hg. Valve area (VTI): 1.39 cm^2. - Mitral valve: Calcified annulus. Mildly thickened leaflets . There was moderate regurgitation. - Left atrium: The atrium was severely dilated. - Right atrium: The atrium was moderately dilated. - Pulmonic valve: There was moderate regurgitation. - Pulmonary arteries: Systolic pressure was moderately increased. PA peak pressure: 50 mm Hg (S).  Dg Chest Port 1 View 02/13/2017 IMPRESSION: Cardiomegaly, vascular congestion.  02/14/2017 IMPRESSION: 1. Cardiomegaly. Improvement pulmonary vascular congestion. Small bilateral pleural effusions.  2. Low lung volumes.   02/17/2017 IMPRESSION: Mild pulmonary edema. Aortic atherosclerosis.   Head CT   02/15/2017 3.4 x 3.3 x 3.9 cm thalamic hemorrhage with mild surrounding vasogenic edema causing mild mass effect upon the right lateral aspect of third ventricle (4 mm of midline shift to left) with breakthrough of hemorrhage into the right lateral ventricle and tiny amount of blood within the left lateral ventricle appears relatively similar to the prior exam. Tiny amount of blood within sulcus of right cerebellum may represent subarachnoid blood secondary to the interventricular blood. Otherwise no evidence of new intracranial hemorrhage. Ventricular size stable. Chronic microvascular changes.  LUE venous doppler  02/14/2017 No evidence of deep vein or superficial thrombosis involving the left upper extremity and right subclavian vein.   HISTORY OF PRESENT ILLNESS [name] is a 83 y.o. female with a history of hypertension, peripheral vascular disease, atrial fibrillation on anticoagulation, diabetes mellitus, CHF and recent right CVA with left-sided weakness, presenting with recurrent acute left side weakness with facial droop, slurred speech and left extremity weakness. Onset of symptoms was at 8:00 PM today. She has been taking Eliquis daily for anticoagulation. CT scan of the head showed right 3.4 x 3.1 cm thalamic hemorrhage with ventricular extension and mild mass effect with a 4 mm right to left midline shift. Blood pressure was markedly elevated at 222/144. Cardene drip IV was started for acute management of hypertension.  LSN: 8:00 PM on 02/10/2017 tPA Given: No: Acute ICH, recent stroke, on anticoagulation with Eliquis mRankin:  Past Medical History:  Diagnosis Date  . Atrial fibrillation (HCC)   . CHF (congestive heart failure) (HCC)   . Diabetes mellitus without complication (HCC)   . Dysrhythmia   . Foot ulcer, right (HCC)   . Gangrene of toe (HCC)   . Gout   . Hypertension   . Peripheral vascular disease [facility])          Past Surgical History:   Procedure Laterality Date  . EYE  SURGERY    . JOINT REPLACEMENT    . LEG AMPUTATION THROUGH KNEE Left   . PERIPHERAL VASCULAR CATHETERIZATION Right 04/08/2015   Procedure: Lower Extremity Angiography;  Surgeon: Renford Dills, MD;  Location: [facility];  Service: Cardiovascular;  Laterality: Right;  . PERIPHERAL VASCULAR CATHETERIZATION  04/08/2015   Procedure: Lower Extremity Intervention;  Surgeon: Renford Dills, MD;  Location: [facility];  Service: Cardiovascular;;  . PERIPHERAL VASCULAR CATHETERIZATION N/A 07/15/2015   Procedure: Abdominal Aortogram w/Lower Extremity;  Surgeon: Renford Dills, MD;  Location: [facility];  Service: Cardiovascular;  Laterality: N/A;  . PERIPHERAL VASCULAR CATHETERIZATION  07/15/2015   Procedure: Lower Extremity Intervention;  Surgeon: Renford Dills, MD;  Location: [facility];  Service: Cardiovascular;;  . PERIPHERAL VASCULAR CATHETERIZATION Right 02/09/2016   Procedure: Lower Extremity Angiography;  Surgeon: Annice Needy, MD;  Location: [facility];  Service: Cardiovascular;  Laterality: Right;  . PERIPHERAL VASCULAR CATHETERIZATION  02/09/2016   Procedure: Lower Extremity Intervention;  Surgeon: Annice Needy, MD;  Location: [facility];  Service: Cardiovascular;;  . Right Knee replacement      HOSPITAL COURSE [name] is a 83 y.o. female with history of hypertension, peripheral vascular disease, atrial fibrillation on anticoagulation, diabetes mellitus, CHF and recent right CVA with left-sided weakness, presenting with recurrent left side weakness with facial droop, slurred speech in setting of extreme hypertension.  CT showed a R thalamic hemorrhage.   Stroke:   R thalamic hemorrhage with IVH secondary to hypertensive emergency and on anticoagulation s/p Kcentral  Resultant  Lethargy, right gaze, left neglect, left hemiplegia  Code Stroke CT R thalamic hmg w/  IVH,  R occipital horn entrapment w/ 4mm R to L shift. 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 6 mm right ACA aneurysm.  Carotid Doppler  12/2016 B ICA stenosis, R ICA critical stenosis/occlusion, L ICA >70%  2D Echo 12/2016 EF 55-60%. No source of embolus   LDL 144  HgbA1c 6.1  SCDs for VTE prophylaxis  DIET - DYS 1 Room service appropriate? Yes; Fluid consistency: Nectar Thick.  Eliquis (apixaban) daily prior to admission, now on no antithrombotics.  Ongoing aggressive stroke risk factor management  Therapy recommendations:  SNF  Disposition:  Discharge to SNF  Hypertensive Emergency  BP 222/144 on arrival  on cleviprex intermittently  Increase metoprolol to 75mg  bid  Off cleviprex as able  SBP goal 130-160 due to b/l ICA stenosis  Avoid hypotension given critical stenosis  Atrial Fibrillation with RVR  Home anticoagulation:  Eliquis (apixaban) daily   Not an AC d/t hmg  HR under control  decrease metoprolol from 100mg  to 50mg  bid due to bradycardia  Continue cardizem 30mg    Bilateral carotid stenosis  01/20/17 CUS right ICA occlusion and left ICA 70% stenosis  CTA head and neck - b/l ICA proximal 80% stenosis  Avoid hypotension  BP goal 130-160   Not a candidate for intervention at this time  Hx stroke/TIA  12/2016 - R PCA scattered infarcts with atrial fibrillation while on eliquis. Found to have B ICA stenosis, R ICA critical stenosis/occlusion, L ICA >70%. EF 55-60% - continued on eliquis  LUE swollen  LUE warm and swollen - improved  Venous doppler - negative for DVT  LUE elevation  Pt allergic to prednisone  Completed colchicine 1.2mg /0.6mg   Left shoulder X-ray - no fracture or dislocation  Febrile - likely due to aspiration / dysphagia, now resolved  Tmax 100.5 -> 97.7 -> 98.4  axillary  WBC 8.4 -> 15.1 -> 12.8 -> 10.3->9.3  Dysphagia - on nectar thick liquid  Blood culture - NGTD  UA WBC 6-30  CXR repeat - mild pulmonary edema  CHF  Wheezing improved  CXR vascular congestion improved  Lasix 40mg  po resumed  Off IVF  Encourage PO intake - ate lunch 80%  Hyperlipidemia  Home meds:  lipitor 10  LDL 144, goal < 70  Resumed lipitor this admission  continue statin at discharge  Diabetes type II  HgbA1c 6.1, at goal < 7.0  Controlled  SSI  CBG monitoring  Other Stroke Risk Factors  Advanced age  Former Cigarette smoker  Hx CHF per record - EF 55-60%  PVD  Hypokalemia - 3.1 (on lasix 40 mg daily) -> supplement further and monitor -> 3.5   DISCHARGE EXAM Blood pressure (!) 166/54, pulse 79, temperature 98.5 F (36.9 C), temperature source Oral, resp. rate 18, height 5\' 6"  (1.676 m), weight 77.9 kg (171 lb 11.2 oz), SpO2 97 %.  General - Well nourished, well developed, not in acute distress but febrile.  Ophthalmologic - Fundi not visualized due to noncooperation.  Cardiovascular - irregularly irregular heart rate and rhythm with tachycardia.  Neuro - awake alert, afebrile. Following simple commands, both central and peripheral commands, orientated to self, age and place but not to time. Severe dysarthria. PERRL, eyes right gaze preference, left neglect, not cross midline, not blinking to visual threat on the left. Left facial droop. Left UE 0/5, LLE BKA but 2+/5 proximal on pain stimulation. LUE swollen and warm at hand and shoulder and distal forearm, but seems improved over the last several days. RUE spontaneous movement against gravity, and withdraw to pain. RUE intact FTN. Sensation not cooperative and gait not tested.  Discharge Diet   DIET - DYS 1 Room service appropriate? Yes; Fluid consistency: Nectar Thick liquids  DISCHARGE PLAN  Disposition:  Discharged to a skilled nursing facility  No antithrombotic for  secondary stroke prevention.  Ongoing risk factor control by Primary Care Physician at time of discharge  Follow-up Dorothey Baseman, MD in 2 weeks.  Follow-up with Dr. Marvel Plan in 4 weeks  40 minutes were spent preparing discharge.  Marvel Plan, MD PhD Stroke Neurology 02/17/2017 10:49 PM

## 2017-02-17 NOTE — Care Management Note (Signed)
Case Management Note  Patient Details  Name: Nolon NationsHelmi Salvino MRN: [id number] Date of Birth: 04/27/1921  Subjective/Objective:                    Action/Plan: [facility]. No further needs per CM.  Expected Discharge Date:  02/17/17               Expected Discharge Plan:  Skilled Nursing Facility  In-House Referral:  Clinical Social Work  Discharge planning Services  CM Consult  Post Acute Care Choice:    Choice offered to:     DME Arranged:    DME Agency:     HH Arranged:    HH Agency:     Status of Service:  Completed, signed off  If discussed at [employer] Meetings, dates discussed:    Additional Comments:  Kermit BaloKelli F Vaughan Garfinkle, RN 02/17/2017, 4:34 PM

## 2017-02-17 NOTE — Progress Notes (Signed)
Physical Therapy Treatment Patient Details Name: [name] MRN: [id number] DOB: [birth year] Today's Date: 02/17/2017    History of Present Illness 83 y.o.femalewith a known history of Atrial fibrillation on anticoagulation with eliquis, DM, CHF, dysrhythmia, HTN, PVD, and previous LLE BKA who presented to ED on 4/25 with Right CVA, D/C to SNF and returned home 5/17 with increased left weakness same day and return to ER with right thalamic hemorrhage    PT Comments    Pt making slow progress.  Emphasis on sitting balance/tolerance and scanning to the Left   Follow Up Recommendations  SNF;Supervision/Assistance - 24 hour     Equipment Recommendations  None recommended by PT    Recommendations for Other Services       Precautions / Restrictions Precautions Precautions: Fall Precaution Comments: L BKA, left hemiplegia, right gaze preference    Mobility  Bed Mobility Overal bed mobility: Needs Assistance Bed Mobility: Rolling;Sidelying to Sit;Sit to Sidelying Rolling: Mod assist (Left) Sidelying to sit: Max assist     Sit to sidelying: Total assist;+2 for physical assistance General bed mobility comments: minimal amount of assist on bed with R UE, significant truncal assist to sit up from Left side.  Transfers                 General transfer comment: did not attempt, ambulance coming to take pt to [facility]  Ambulation/Gait                 Stairs            Wheelchair Mobility    Modified Rankin (Stroke Patients Only) Modified Rankin (Stroke Patients Only) Pre-Morbid Rankin Score: Moderate disability Modified Rankin: Severe disability     Balance Overall balance assessment: Needs assistance Sitting-balance support: Feet supported;Single extremity supported Sitting balance-Leahy Scale: Poor Sitting balance - Comments: pt sat EOB>20 min working on scanning R to command or following object.  Sitting EOB with min assist generally, min  guard for 5-10 secs.  Pt lost balance with each loss of focus.  pt sat is posterior tilt, but could be facilitated to almost pelvic neutral with head up.                                    Cognition Arousal/Alertness: Awake/alert Behavior During Therapy: Flat affect Overall Cognitive Status: Impaired/Different from baseline Area of Impairment: Orientation;Attention;Following commands;Problem solving;Awareness                 Orientation Level: Time Current Attention Level: Sustained Memory: Decreased short-term memory Following Commands: Follows one step commands with increased time Safety/Judgement: Decreased awareness of safety;Decreased awareness of deficits Awareness: Intellectual Problem Solving: Slow processing;Decreased initiation;Requires verbal cues;Requires tactile cues        Exercises Other Exercises Other Exercises: Stetching L knee/hams into extension.    General Comments General comments (skin integrity, edema, etc.): Daughter present throughout.  L hand still red and edematous at joints, but pt does not express any pain in the hand when touching or moving it.      Pertinent Vitals/Pain Pain Assessment: Faces Faces Pain Scale: Hurts little more Pain Location: vague Pain Descriptors / Indicators: Grimacing Pain Intervention(s): Monitored during session    Home Living                      Prior Function  PT Goals (current goals can now be found in the care plan section) Acute Rehab PT Goals Patient Stated Goal: none stated PT Goal Formulation: With patient Time For Goal Achievement: 02/26/17 Potential to Achieve Goals: Fair Progress towards PT goals: Progressing toward goals    Frequency    Min 3X/week      PT Plan Current plan remains appropriate    Co-evaluation PT/OT/SLP Co-Evaluation/Treatment: Yes Reason for Co-Treatment: Complexity of the patient's impairments (multi-system involvement) PT goals  addressed during session: Mobility/safety with mobility        AM-PAC PT "6 Clicks" Daily Activity  Outcome Measure  Difficulty turning over in bed (including adjusting bedclothes, sheets and blankets)?: Total Difficulty moving from lying on back to sitting on the side of the bed? : Total Difficulty sitting down on and standing up from a chair with arms (e.g., wheelchair, bedside commode, etc,.)?: Total Help needed moving to and from a bed to chair (including a wheelchair)?: Total Help needed walking in hospital room?: Total Help needed climbing 3-5 steps with a railing? : Total 6 Click Score: 6    End of Session   Activity Tolerance: Patient limited by fatigue;Patient tolerated treatment well Patient left: in bed;with call bell/phone within reach;with nursing/sitter in room;with family/visitor present Nurse Communication: Mobility status PT Visit Diagnosis: Hemiplegia and hemiparesis;Muscle weakness (generalized) (M62.81) Hemiplegia - Right/Left: Left Hemiplegia - dominant/non-dominant: Non-dominant Hemiplegia - caused by: Nontraumatic intracerebral hemorrhage     Time: 1550-1625 PT Time Calculation (min) (ACUTE ONLY): 35 min  Charges:  $Therapeutic Activity: 8-22 mins                    G Codes:       02/17/2017  Worden BingKen Devone Tousley, PT [phone]  (pager)   Eliseo GumKenneth V Taje Littler 02/17/2017, 4:44 PM

## 2017-02-17 NOTE — Progress Notes (Signed)
Occupational Therapy Treatment Patient Details Name: [name] MRN: [id number] DOB: 1921/05/15 Today's Date: 02/17/2017    History of present illness 83 y.o.femalewith a known history of Atrial fibrillation on anticoagulation with eliquis, DM, CHF, dysrhythmia, HTN, PVD, and previous LLE BKA who presented to ED on 4/25 with Right CVA, D/C to SNF and returned home 5/17 with increased left weakness same day and return to ER with right thalamic hemorrhage   OT comments  Pt progressing towards goals, sat EOB approx >20 min with MinA and brief periods of MinGuard assist. Completed seated ADLs with setup with additional focus of scanning to L. Increased assist to maintain sitting balance when focusing on task at hand. Pt continues to lose attention easily and requires verbal cues to redirect. Pt will benefit from continued OT services to maximize safety and independence with ADLs and functional mobility. POC remains appropriate. Will continue to follow.    Follow Up Recommendations  SNF;Supervision/Assistance - 24 hour    Equipment Recommendations  None recommended by OT    Recommendations for Other Services      Precautions / Restrictions Precautions Precautions: Fall Precaution Comments: L BKA, left hemiplegia, right gaze preference Restrictions Weight Bearing Restrictions: No LLE Weight Bearing: Non weight bearing Other Position/Activity Restrictions: noted redness at base of L LE, RN made aware        Mobility Bed Mobility Overal bed mobility: Needs Assistance Bed Mobility: Rolling;Sidelying to Sit;Sit to Sidelying Rolling: Mod assist (Left ) Sidelying to sit: Max assist Supine to sit: Total assist;+2 for physical assistance Sit to supine: Total assist;+2 for physical assistance Sit to sidelying: Total assist;+2 for physical assistance General bed mobility comments: minimal amount of assist on bed with R UE, significant truncal assist to sit up from Left  side.  Transfers                 General transfer comment: did not attempt, ambulance coming to take pt to [facility] Overall balance assessment: Needs assistance Sitting-balance support: Feet supported;Single extremity supported Sitting balance-Leahy Scale: Poor Sitting balance - Comments: pt sat EOB>20 min working on scanning R to command or following object.  Sitting EOB with min assist generally, min guard for 5-10 secs.  Pt lost balance with each loss of focus.  pt sat is posterior tilt, but could be facilitated to almost pelvic neutral with head up.                                   ADL either performed or assessed with clinical judgement   ADL Overall ADL's : Needs assistance/impaired     Grooming: Wash/dry face;Set up;Sitting;Moderate assistance Grooming Details (indicate cue type and reason): completed sitting EOB; assist for maintaining sitting balance while completing task              Lower Body Dressing: Maximal assistance;Bed level Lower Body Dressing Details (indicate cue type and reason): donning socks                General ADL Comments: Pt sat EOB approx 25 min with brief periods of MinGuard Assist, however receives increased assist due to fatigue or when focusing on a task     Vision   Additional Comments: Pt continues to have Rt gaze preference, Max verbal cues for looking towards Left, able to reach midline however increased difficulty looking past midline  Cognition Arousal/Alertness: Awake/alert Behavior During Therapy: Flat affect Overall Cognitive Status: Impaired/Different from baseline Area of Impairment: Orientation;Attention;Following commands;Problem solving;Awareness                 Orientation Level: Time Current Attention Level: Sustained Memory: Decreased short-term memory Following Commands: Follows one step commands with increased time Safety/Judgement: Decreased  awareness of safety;Decreased awareness of deficits Awareness: Intellectual Problem Solving: Slow processing;Decreased initiation;Requires verbal cues;Requires tactile cues          Exercises Exercises: Other exercises Other Exercises Other Exercises:           General Comments Daughter present throughout.  L hand still red and edematous at joints, but pt does not express any pain in the hand when touching or moving it.    Pertinent Vitals/ Pain       Pain Assessment: Faces Faces Pain Scale: Hurts little more Pain Location: vague Pain Descriptors / Indicators: Grimacing Pain Intervention(s): Monitored during session                                                          Frequency  Min 2X/week        Progress Toward Goals  OT Goals(current goals can now be found in the care plan section)  Progress towards OT goals: Progressing toward goals  Acute Rehab OT Goals Patient Stated Goal: none stated OT Goal Formulation: Patient unable to participate in goal setting Time For Goal Achievement: 02/27/17 Potential to Achieve Goals: Fair  Plan Discharge plan remains appropriate    Co-evaluation    PT/OT/SLP Co-Evaluation/Treatment: Yes Reason for Co-Treatment: Complexity of the patient's impairments (multi-system involvement) PT goals addressed during session: Mobility/safety with mobility OT goals addressed during session: ADL's and self-care      AM-PAC PT "6 Clicks" Daily Activity     Outcome Measure   Help from another person eating meals?: Total Help from another person taking care of personal grooming?: A Lot Help from another person toileting, which includes using toliet, bedpan, or urinal?: Total Help from another person bathing (including washing, rinsing, drying)?: Total Help from another person to put on and taking off regular upper body clothing?: Total Help from another person to put on and taking off regular lower body  clothing?: Total 6 Click Score: 7    End of Session    OT Visit Diagnosis: Hemiplegia and hemiparesis Hemiplegia - Right/Left: Left Hemiplegia - dominant/non-dominant: Non-Dominant Hemiplegia - caused by: Other cerebrovascular disease   Activity Tolerance Patient tolerated treatment well   Patient Left in bed;with call bell/phone within reach;with family/visitor present   Nurse Communication Mobility status        Time: [id number] OT Time Calculation (min): 35 min  Charges: OT General Charges $OT Visit: 1 Procedure OT Treatments $Therapeutic Activity: 8-22 mins  Marcy SirenBreanna Jessee Mezera, OT Pager [phone] 02/17/2017   Orlando PennerBreanna L Sherece Gambrill 02/17/2017, 5:16 PM

## 2017-02-17 NOTE — Progress Notes (Signed)
Discharge to: [facility] Anticipated discharge date: 02/17/17 Family notified: Yes, at bedside Transportation by: [facility]  Report #: [id number]  CSW signing off.  Blenda Nicelylizabeth Roselene Gray LCSW [phone]

## 2017-02-18 LAB — CULTURE, BLOOD (ROUTINE X 2)
CULTURE: NO GROWTH
Culture: NO GROWTH
SPECIAL REQUESTS: ADEQUATE

## 2017-03-22 IMAGING — CR DG CHEST 2V
2 series · 2 of 2 positions shown · non-contrast
Comparison: 11/17/2014 and earlier.

CLINICAL DATA: [AGE] female with diabetes, atherosclerosis.
Obesity. Initial encounter.

EXAM:
CHEST  2 VIEW

[dxr chest pa (or ap) and lateral (1 of 2)]
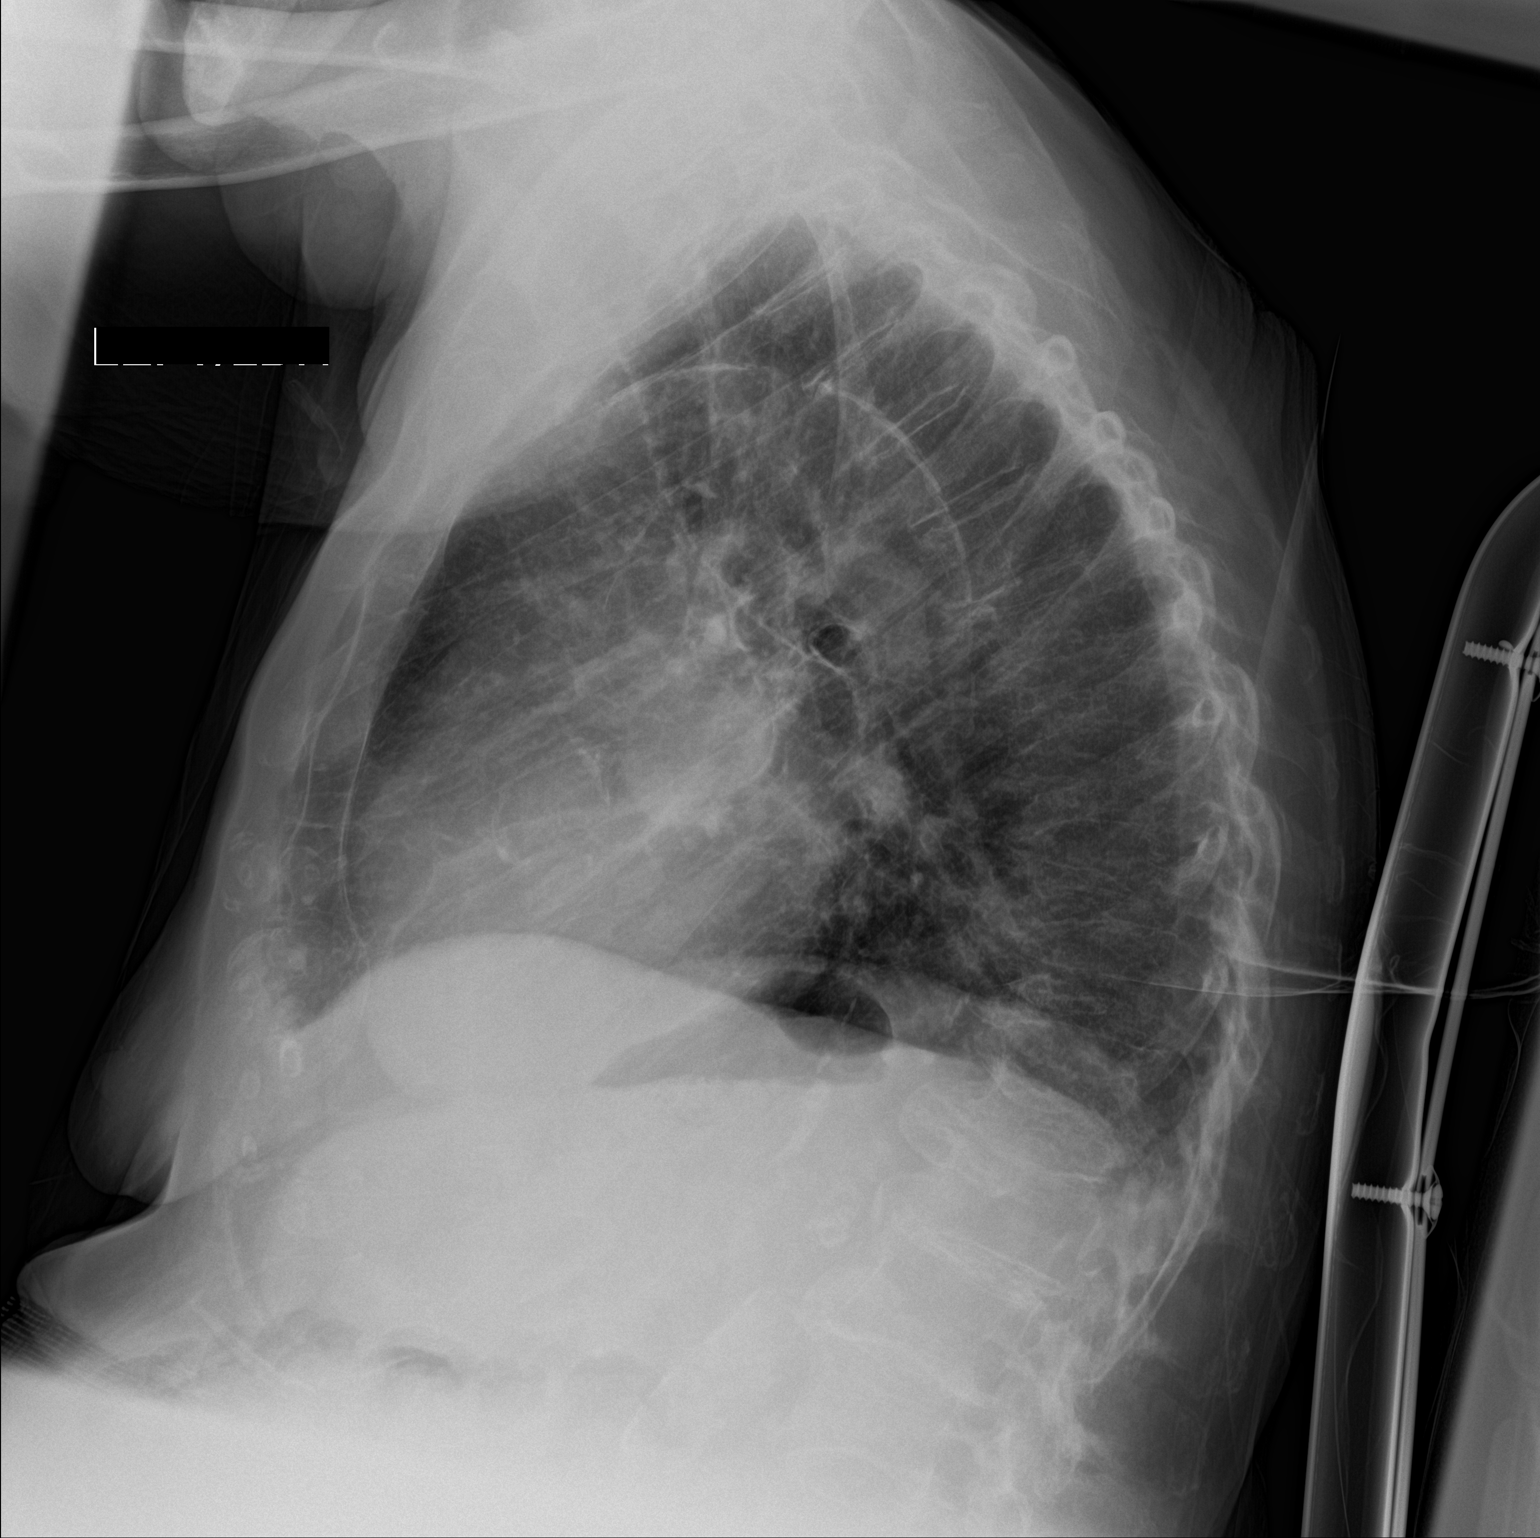

[dxr chest pa (or ap) and lateral (2 of 2)]
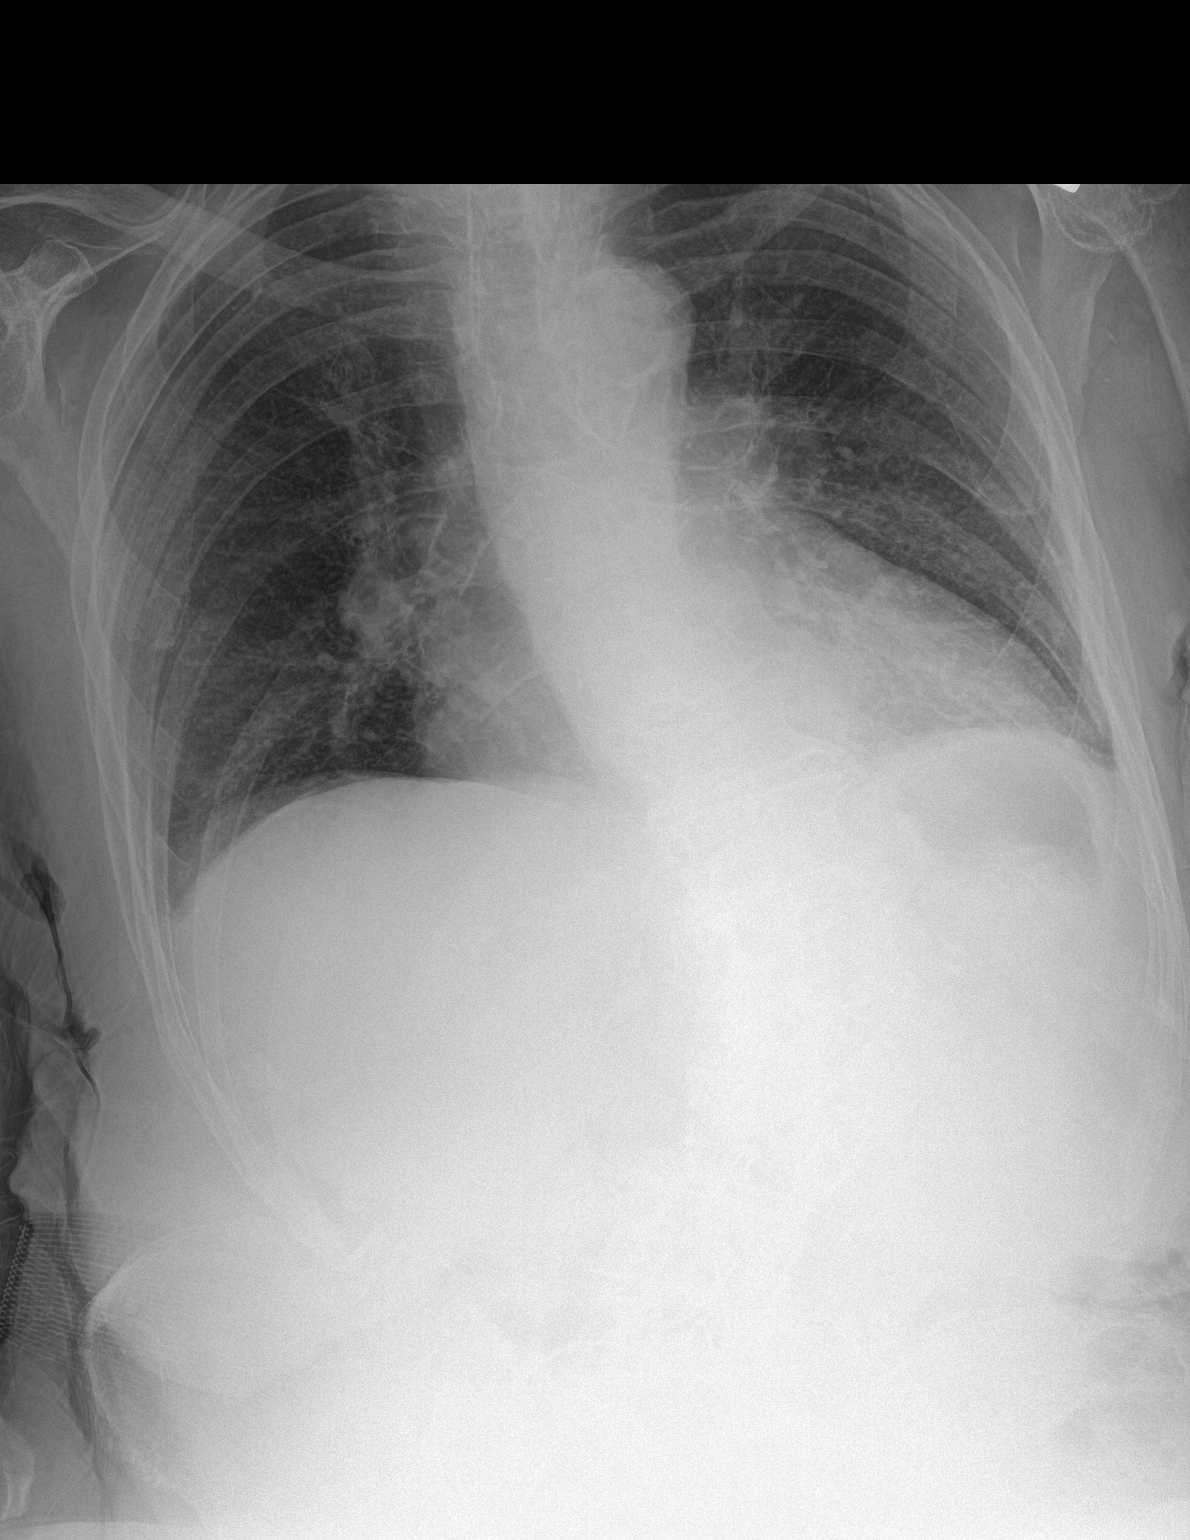

[2 of 2 positions shown; findings below may reference images not displayed]

FINDINGS: Seated AP and lateral views of the chest. Since [REDACTED], resolved
bilateral pleural effusions and improved bibasilar ventilation.
Stable cardiomegaly and mediastinal contours. Calcified
atherosclerosis throughout the aorta again noted. No pneumothorax.
No pulmonary edema or confluent pulmonary opacity. Osteopenia.
Levoconvex thoracolumbar scoliosis. Stable visualized osseous
structures.
IMPRESSION: Resolved edema and bilateral pleural effusions since [REDACTED]. No
new cardiopulmonary abnormality.

## 2017-05-08 IMAGING — DX DG FOOT COMPLETE 3+V*R*
3 series · 3 of 3 positions shown · non-contrast
Comparison: None.

CLINICAL DATA: Nonhealing cutaneous ulcers over the heel and the
third toe known peripheral vascular disease ; status post BKA on the
left, history of diabetes

EXAM:
RIGHT FOOT COMPLETE - 3+ VIEW

[foot ap]
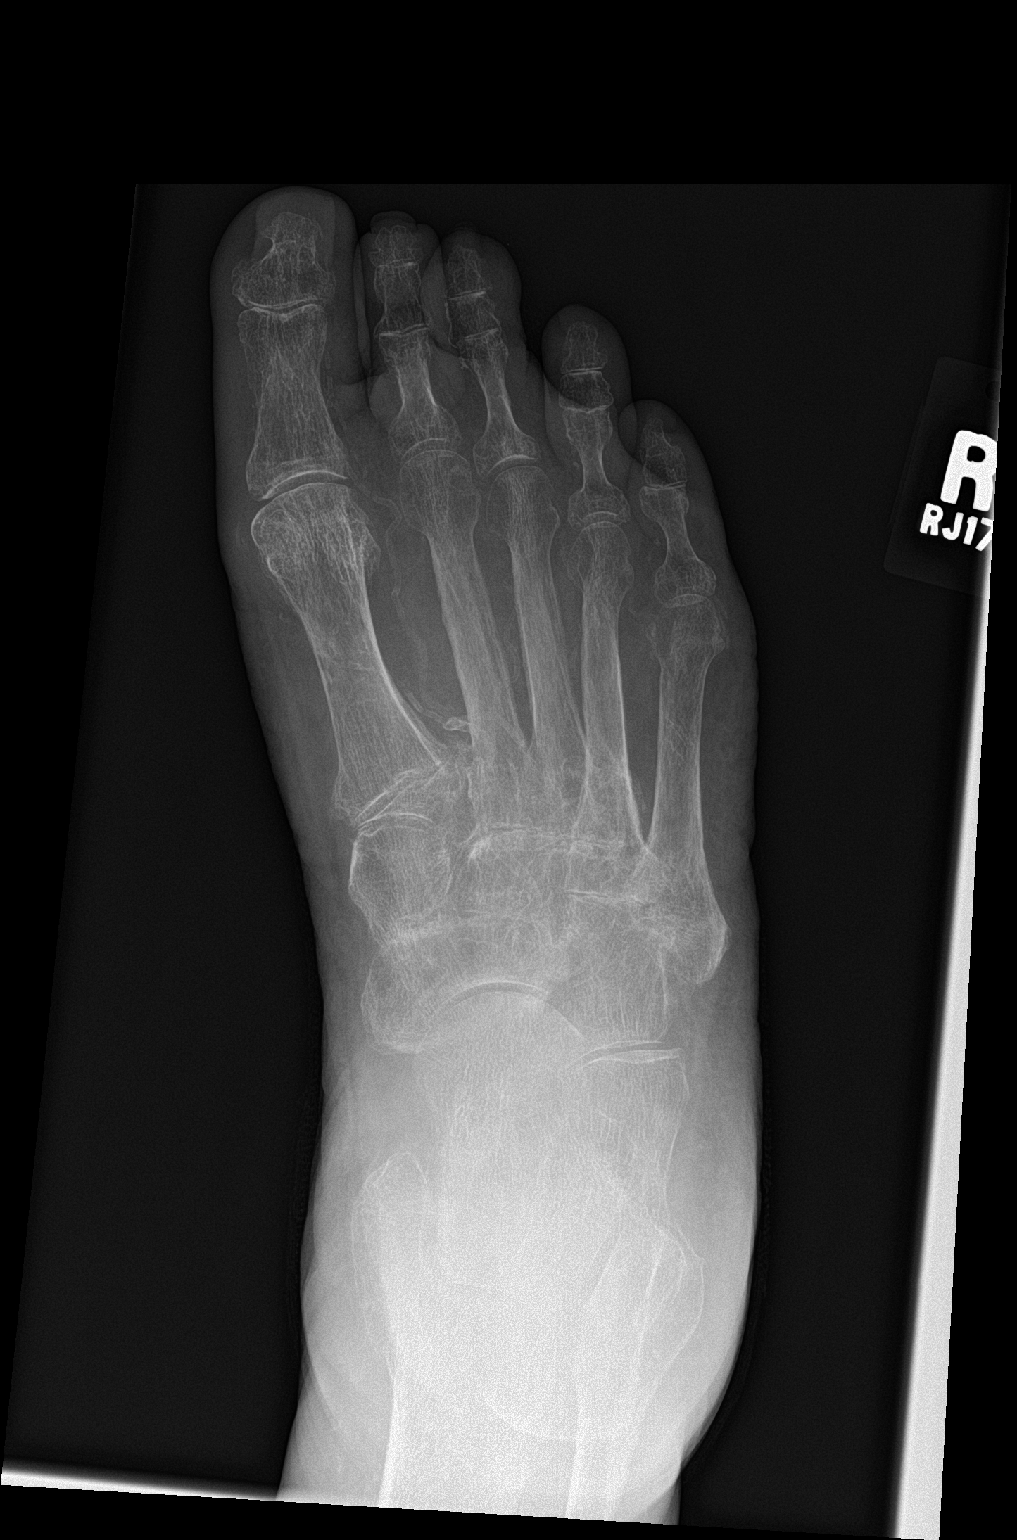

[foot obl]
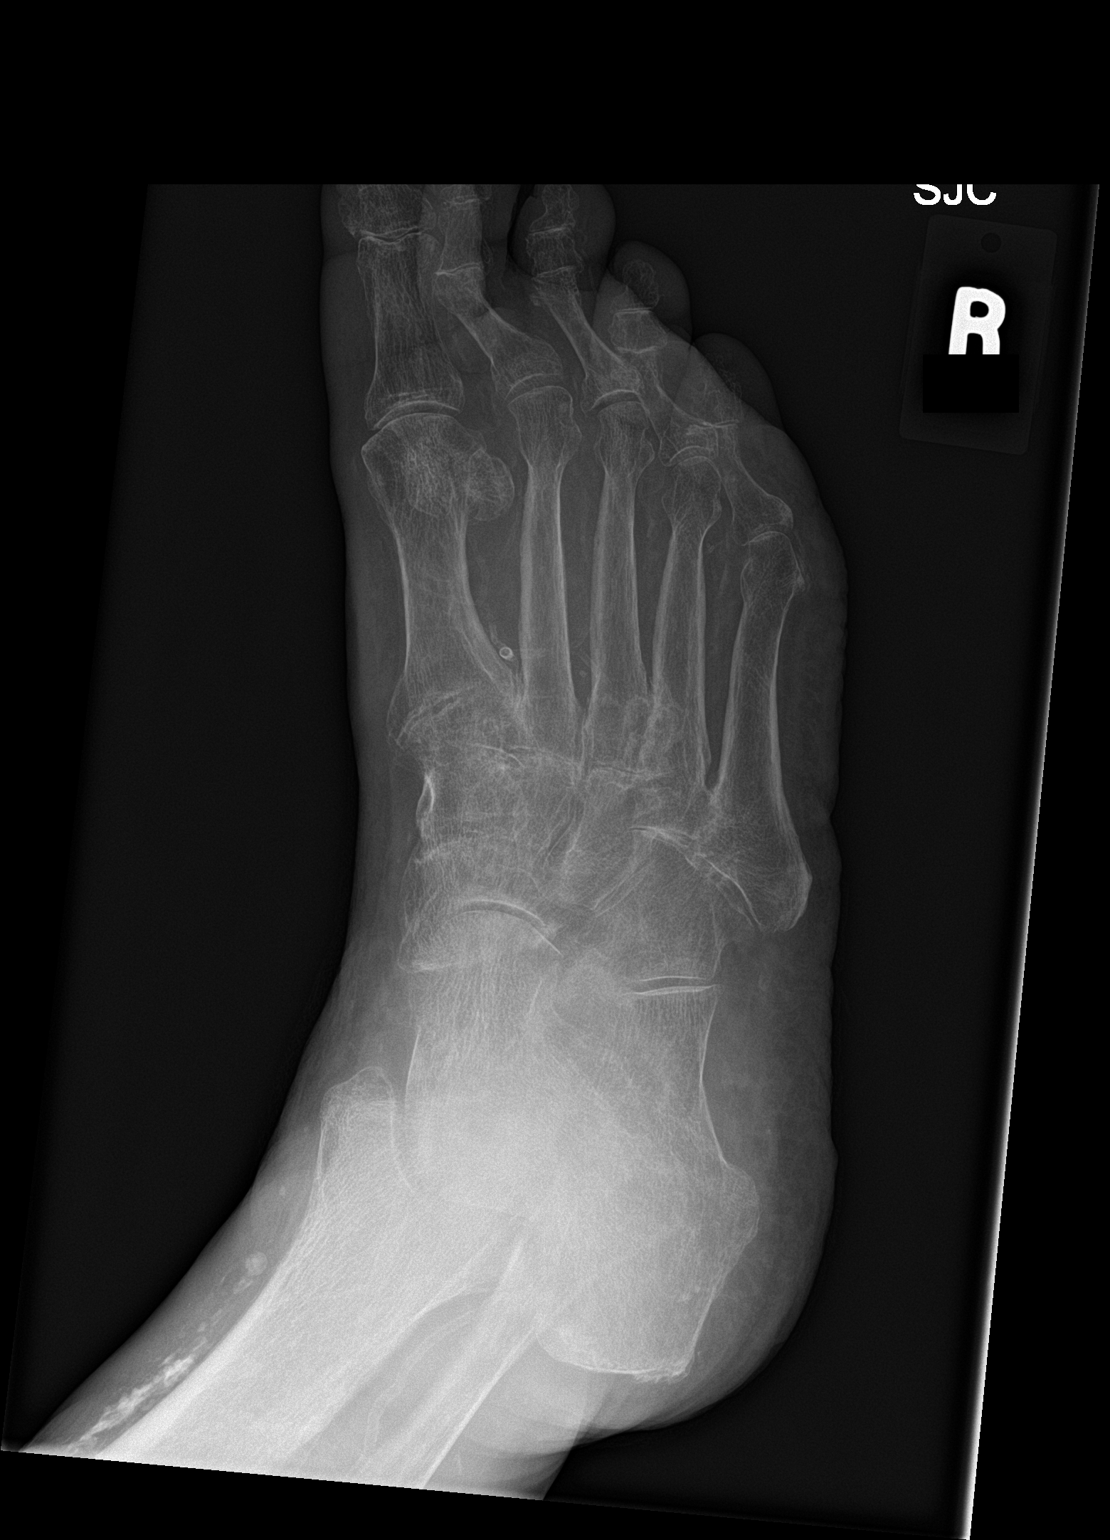

[foot lat]
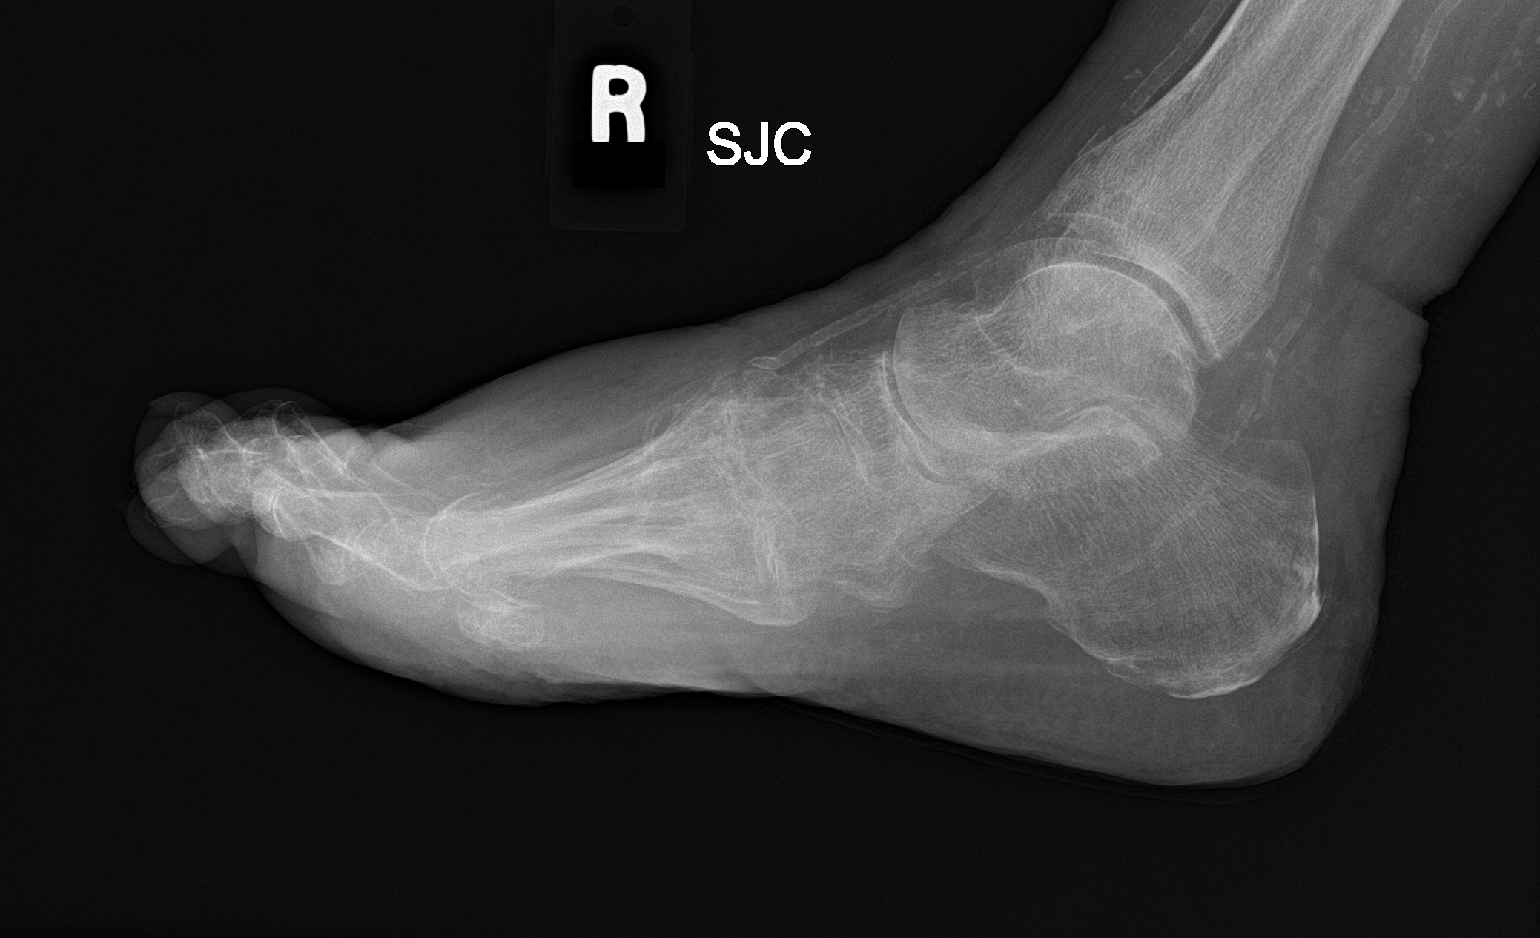

[3 of 3 positions shown; findings below may reference images not displayed]

FINDINGS: The bones are diffusely osteopenic. At the level of the third toe
there is soft tissue swelling but no lytic or blastic bony lesion is
demonstrated. There are mild osteoarthritic changes of the
interphalangeal joints. The metatarsophalangeal joints are well
maintained. There is moderate osteoarthritic change of the
intertarsal and tarsometatarsal joints. The calcaneus exhibits no
lytic lesion to suggest osteomyelitis. No soft tissue gas
collections over the third toe or over the heel are demonstrated.
There are exuberant arterial calcifications.
IMPRESSION: There is no radiographic evidence of acute osteomyelitis of the
third toe nor of the calcaneus. There is soft tissue swelling over
the third toe. There are osteoarthritic changes diffusely as
described.

## 2017-06-20 ENCOUNTER — Encounter (INDEPENDENT_AMBULATORY_CARE_PROVIDER_SITE_OTHER): Payer: 59

## 2017-06-20 ENCOUNTER — Ambulatory Visit (INDEPENDENT_AMBULATORY_CARE_PROVIDER_SITE_OTHER): Payer: 59 | Admitting: Vascular Surgery

## 2017-06-27 DEATH — deceased

## 2018-04-18 IMAGING — CR DG FOOT COMPLETE 3+V*R*
1 series · 3 of 3 positions shown · non-contrast
Comparison: Right foot series of February 11, 2015.

CLINICAL DATA: Nonhealing wound over the second toe of the right
foot; images were obtained with foot in bandages. The patient has
known diabetes and peripheral vascular disease.

EXAM:
RIGHT FOOT COMPLETE - 3+ VIEW

[Series 1: x foot ap right · 0.14mm/px · 3 of 3 slices shown]
[im 1/3]
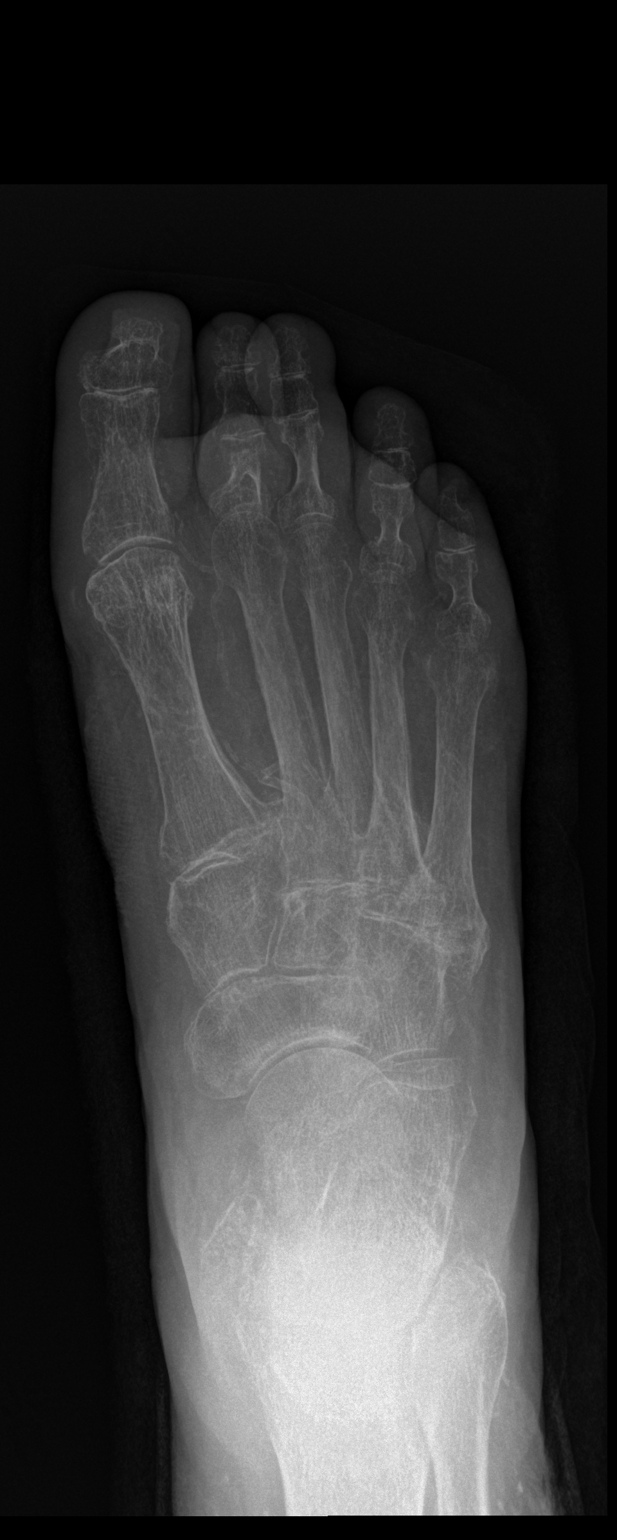
[im 2/3]
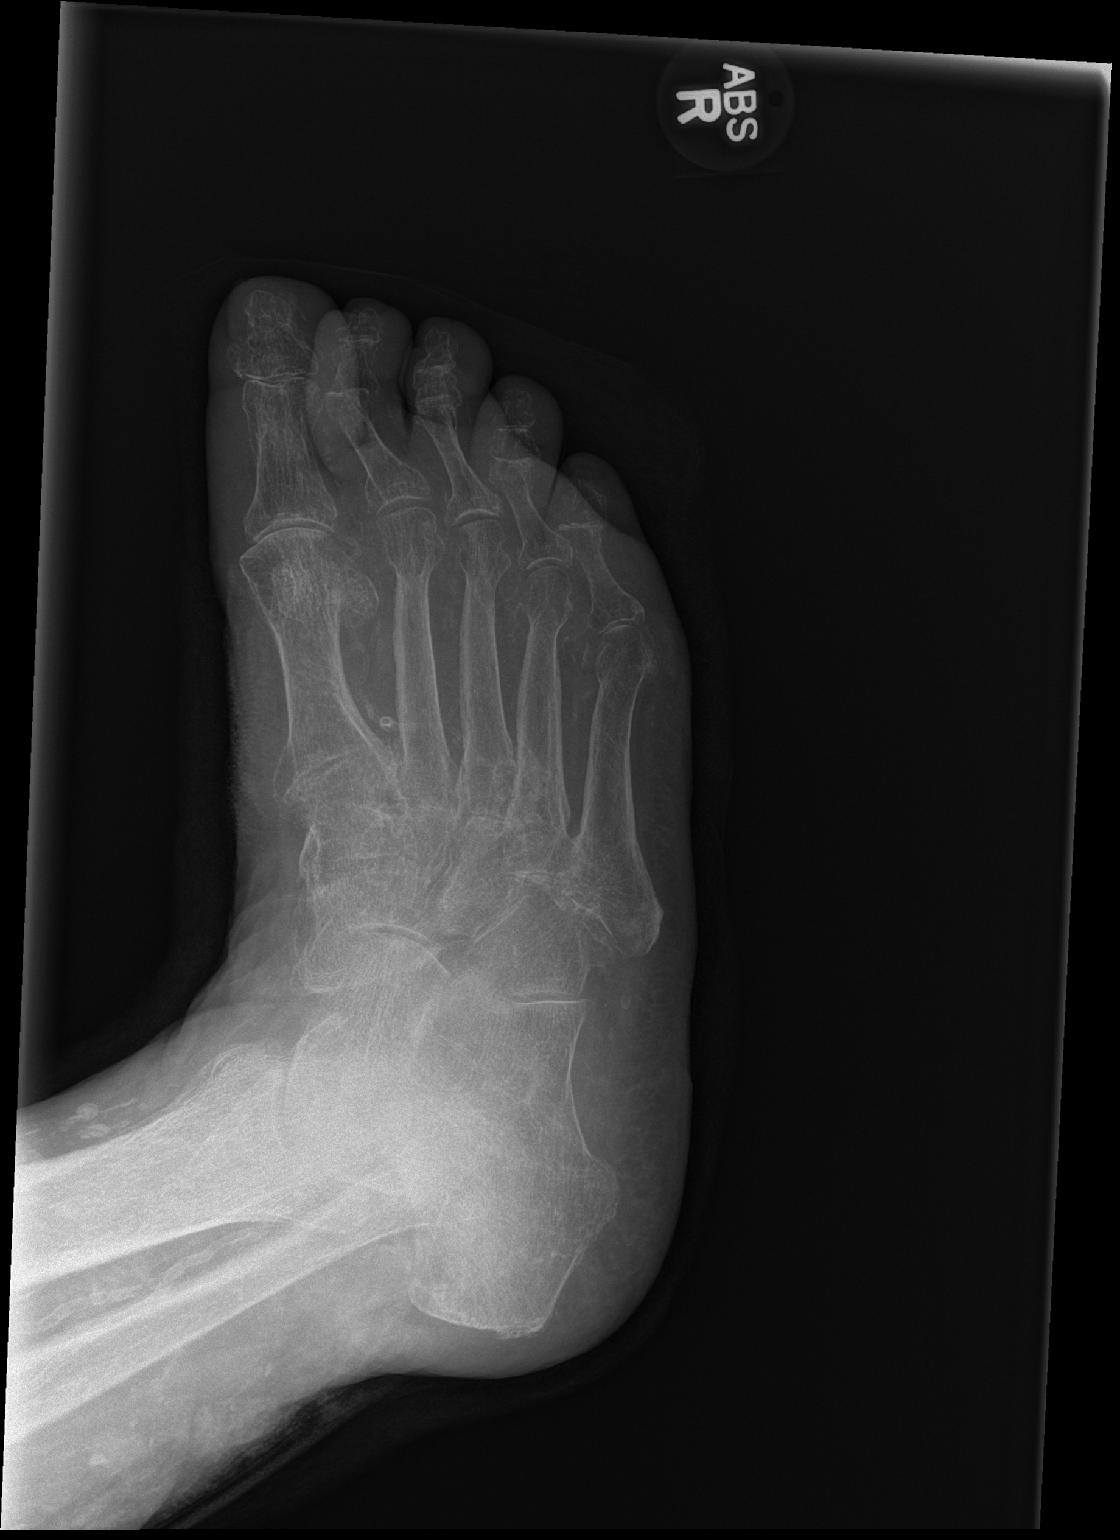
[im 3/3]
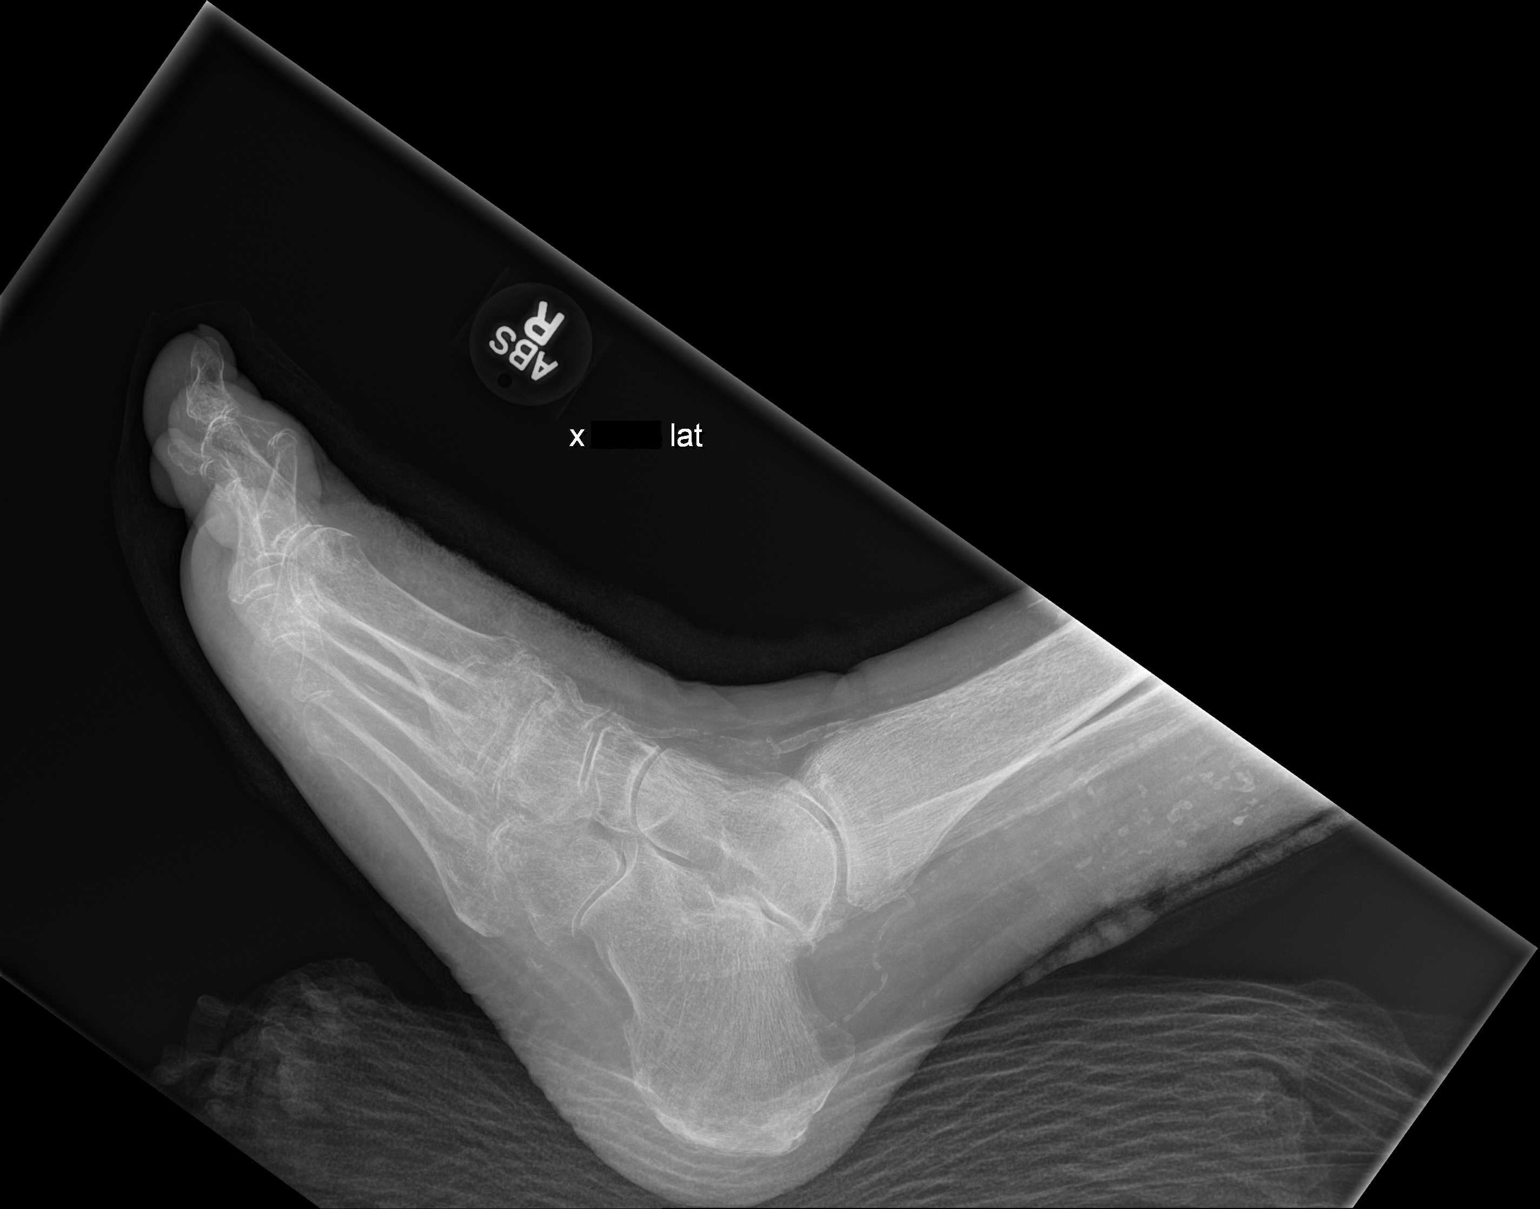

[3 of 3 positions shown; findings below may reference images not displayed]

FINDINGS: The bones of the right foot are diffusely osteopenic which appears
more severe overall on today's study than on the study of 11 months
ago. No definite loss of cortical bone is observed in the second
toe. The joint spaces are preserved. The other phalanges appear
intact as well as do the metatarsals. The bones of the hindfoot are
unremarkable. There is soft tissue swelling of the second toe.
IMPRESSION: Findings compatible with cellulitis of the second toe. No objective
evidence of osteomyelitis but there is severe diffuse osteopenia
which limits sensitivity of the study.
